# Patient Record
Sex: Female | Born: 1940
Health system: Southern US, Community
[De-identification: ages and names within clinical notes are randomized; demographics above are authoritative.]

## PROBLEM LIST (undated history)

## (undated) DIAGNOSIS — I872 Venous insufficiency (chronic) (peripheral): Secondary | ICD-10-CM

## (undated) DIAGNOSIS — H903 Sensorineural hearing loss, bilateral: Secondary | ICD-10-CM

## (undated) DIAGNOSIS — Z8541 Personal history of malignant neoplasm of cervix uteri: Secondary | ICD-10-CM

## (undated) DIAGNOSIS — I509 Heart failure, unspecified: Secondary | ICD-10-CM

## (undated) DIAGNOSIS — C569 Malignant neoplasm of unspecified ovary: Secondary | ICD-10-CM

## (undated) DIAGNOSIS — K219 Gastro-esophageal reflux disease without esophagitis: Secondary | ICD-10-CM

## (undated) DIAGNOSIS — E785 Hyperlipidemia, unspecified: Secondary | ICD-10-CM

## (undated) DIAGNOSIS — F29 Unspecified psychosis not due to a substance or known physiological condition: Secondary | ICD-10-CM

## (undated) DIAGNOSIS — F1120 Opioid dependence, uncomplicated: Secondary | ICD-10-CM

## (undated) DIAGNOSIS — M51379 Other intervertebral disc degeneration, lumbosacral region without mention of lumbar back pain or lower extremity pain: Secondary | ICD-10-CM

## (undated) DIAGNOSIS — I10 Essential (primary) hypertension: Secondary | ICD-10-CM

## (undated) DIAGNOSIS — M169 Osteoarthritis of hip, unspecified: Secondary | ICD-10-CM

## (undated) DIAGNOSIS — L0293 Carbuncle, unspecified: Secondary | ICD-10-CM

## (undated) DIAGNOSIS — I341 Nonrheumatic mitral (valve) prolapse: Secondary | ICD-10-CM

## (undated) DIAGNOSIS — S065X9A Traumatic subdural hemorrhage with loss of consciousness of unspecified duration, initial encounter: Secondary | ICD-10-CM

## (undated) DIAGNOSIS — L910 Hypertrophic scar: Secondary | ICD-10-CM

## (undated) DIAGNOSIS — L0292 Furuncle, unspecified: Secondary | ICD-10-CM

## (undated) DIAGNOSIS — F32A Depression, unspecified: Secondary | ICD-10-CM

## (undated) DIAGNOSIS — M159 Polyosteoarthritis, unspecified: Secondary | ICD-10-CM

## (undated) DIAGNOSIS — R569 Unspecified convulsions: Secondary | ICD-10-CM

## (undated) DIAGNOSIS — Z8673 Personal history of transient ischemic attack (TIA), and cerebral infarction without residual deficits: Secondary | ICD-10-CM

## (undated) DIAGNOSIS — M5137 Other intervertebral disc degeneration, lumbosacral region: Secondary | ICD-10-CM

## (undated) DIAGNOSIS — K5730 Diverticulosis of large intestine without perforation or abscess without bleeding: Secondary | ICD-10-CM

## (undated) DIAGNOSIS — F329 Major depressive disorder, single episode, unspecified: Secondary | ICD-10-CM

## (undated) DIAGNOSIS — R413 Other amnesia: Secondary | ICD-10-CM

## (undated) HISTORY — DX: Other intervertebral disc degeneration, lumbosacral region: M51.37

## (undated) HISTORY — DX: Other intervertebral disc degeneration, lumbosacral region without mention of lumbar back pain or lower extremity pain: M51.379

## (undated) HISTORY — DX: Personal history of transient ischemic attack (TIA), and cerebral infarction without residual deficits: Z86.73

## (undated) HISTORY — DX: Sensorineural hearing loss, bilateral: H90.3

## (undated) HISTORY — DX: Venous insufficiency (chronic) (peripheral): I87.2

## (undated) HISTORY — DX: Hypertrophic scar: L91.0

## (undated) HISTORY — DX: Essential (primary) hypertension: I10

## (undated) HISTORY — DX: Other amnesia: R41.3

## (undated) HISTORY — DX: Personal history of malignant neoplasm of cervix uteri: Z85.41

## (undated) HISTORY — DX: Osteoarthritis of hip, unspecified: M16.9

## (undated) HISTORY — DX: Nonrheumatic mitral (valve) prolapse: I34.1

## (undated) HISTORY — DX: Furuncle, unspecified: L02.92

## (undated) HISTORY — DX: Unspecified convulsions: R56.9

## (undated) HISTORY — PX: OOPHORECTOMY: SHX86

## (undated) HISTORY — PX: LUMBAR DISC SURGERY: SHX700

## (undated) HISTORY — DX: Diverticulosis of large intestine without perforation or abscess without bleeding: K57.30

## (undated) HISTORY — DX: Malignant neoplasm of unspecified ovary: C56.9

## (undated) HISTORY — DX: Gastro-esophageal reflux disease without esophagitis: K21.9

## (undated) HISTORY — DX: Hyperlipidemia, unspecified: E78.5

## (undated) HISTORY — DX: Opioid dependence, uncomplicated: F11.20

## (undated) HISTORY — DX: Carbuncle, unspecified: L02.93

## (undated) HISTORY — DX: Major depressive disorder, single episode, unspecified: F32.9

## (undated) HISTORY — DX: Heart failure, unspecified: I50.9

## (undated) HISTORY — DX: Depression, unspecified: F32.A

## (undated) HISTORY — DX: Polyosteoarthritis, unspecified: M15.9

## (undated) HISTORY — PX: ABDOMINAL HYSTERECTOMY: SHX81

## (undated) HISTORY — DX: Unspecified psychosis not due to a substance or known physiological condition: F29

## (undated) HISTORY — PX: JOINT REPLACEMENT: SHX530

## (undated) HISTORY — DX: Traumatic subdural hemorrhage with loss of consciousness of unspecified duration, initial encounter: S06.5X9A

---

## 1970-10-30 DIAGNOSIS — Z8541 Personal history of malignant neoplasm of cervix uteri: Secondary | ICD-10-CM

## 1970-10-30 DIAGNOSIS — C569 Malignant neoplasm of unspecified ovary: Secondary | ICD-10-CM

## 1970-10-30 HISTORY — DX: Personal history of malignant neoplasm of cervix uteri: Z85.41

## 1970-10-30 HISTORY — DX: Malignant neoplasm of unspecified ovary: C56.9

## 1998-02-01 ENCOUNTER — Encounter: Admission: RE | Admit: 1998-02-01 | Discharge: 1998-02-01 | Payer: Self-pay | Admitting: Hematology and Oncology

## 1998-02-22 ENCOUNTER — Encounter: Admission: RE | Admit: 1998-02-22 | Discharge: 1998-02-22 | Payer: Self-pay | Admitting: Internal Medicine

## 1998-03-08 ENCOUNTER — Encounter: Admission: RE | Admit: 1998-03-08 | Discharge: 1998-03-08 | Payer: Self-pay | Admitting: Internal Medicine

## 1998-04-15 ENCOUNTER — Encounter: Admission: RE | Admit: 1998-04-15 | Discharge: 1998-04-15 | Payer: Self-pay | Admitting: Hematology and Oncology

## 1998-05-07 ENCOUNTER — Ambulatory Visit (HOSPITAL_BASED_OUTPATIENT_CLINIC_OR_DEPARTMENT_OTHER): Admission: RE | Admit: 1998-05-07 | Discharge: 1998-05-07 | Payer: Self-pay | Admitting: Orthopaedic Surgery

## 1998-06-09 ENCOUNTER — Encounter: Admission: RE | Admit: 1998-06-09 | Discharge: 1998-06-09 | Payer: Self-pay | Admitting: Internal Medicine

## 1998-07-16 ENCOUNTER — Encounter: Admission: RE | Admit: 1998-07-16 | Discharge: 1998-07-16 | Payer: Self-pay | Admitting: Internal Medicine

## 1998-07-28 ENCOUNTER — Encounter: Admission: RE | Admit: 1998-07-28 | Discharge: 1998-07-28 | Payer: Self-pay | Admitting: Internal Medicine

## 1998-08-09 ENCOUNTER — Encounter: Admission: RE | Admit: 1998-08-09 | Discharge: 1998-08-09 | Payer: Self-pay | Admitting: Internal Medicine

## 1998-09-30 ENCOUNTER — Encounter: Admission: RE | Admit: 1998-09-30 | Discharge: 1998-09-30 | Payer: Self-pay | Admitting: Internal Medicine

## 1998-11-10 ENCOUNTER — Encounter: Admission: RE | Admit: 1998-11-10 | Discharge: 1998-11-10 | Payer: Self-pay | Admitting: Hematology and Oncology

## 1998-11-10 ENCOUNTER — Encounter: Payer: Self-pay | Admitting: Hematology and Oncology

## 1998-11-10 ENCOUNTER — Ambulatory Visit (HOSPITAL_COMMUNITY): Admission: RE | Admit: 1998-11-10 | Discharge: 1998-11-10 | Payer: Self-pay | Admitting: Hematology and Oncology

## 1998-11-17 ENCOUNTER — Encounter: Admission: RE | Admit: 1998-11-17 | Discharge: 1998-11-17 | Payer: Self-pay | Admitting: Internal Medicine

## 1999-01-19 ENCOUNTER — Encounter: Admission: RE | Admit: 1999-01-19 | Discharge: 1999-01-19 | Payer: Self-pay | Admitting: Hematology and Oncology

## 1999-04-20 ENCOUNTER — Encounter: Admission: RE | Admit: 1999-04-20 | Discharge: 1999-04-20 | Payer: Self-pay | Admitting: Hematology and Oncology

## 1999-05-06 ENCOUNTER — Ambulatory Visit (HOSPITAL_COMMUNITY): Admission: RE | Admit: 1999-05-06 | Discharge: 1999-05-06 | Payer: Self-pay | Admitting: *Deleted

## 1999-05-13 ENCOUNTER — Ambulatory Visit (HOSPITAL_COMMUNITY): Admission: RE | Admit: 1999-05-13 | Discharge: 1999-05-13 | Payer: Self-pay | Admitting: *Deleted

## 1999-07-08 ENCOUNTER — Encounter: Admission: RE | Admit: 1999-07-08 | Discharge: 1999-07-08 | Payer: Self-pay | Admitting: Internal Medicine

## 1999-08-10 ENCOUNTER — Encounter: Admission: RE | Admit: 1999-08-10 | Discharge: 1999-08-10 | Payer: Self-pay | Admitting: Hematology and Oncology

## 1999-08-26 ENCOUNTER — Encounter: Admission: RE | Admit: 1999-08-26 | Discharge: 1999-08-26 | Payer: Self-pay | Admitting: Internal Medicine

## 1999-08-26 ENCOUNTER — Encounter: Payer: Self-pay | Admitting: Internal Medicine

## 1999-08-26 ENCOUNTER — Ambulatory Visit (HOSPITAL_COMMUNITY): Admission: RE | Admit: 1999-08-26 | Discharge: 1999-08-26 | Payer: Self-pay | Admitting: Internal Medicine

## 1999-10-07 ENCOUNTER — Encounter: Admission: RE | Admit: 1999-10-07 | Discharge: 1999-10-07 | Payer: Self-pay | Admitting: Internal Medicine

## 1999-10-27 ENCOUNTER — Ambulatory Visit (HOSPITAL_COMMUNITY): Admission: RE | Admit: 1999-10-27 | Discharge: 1999-10-27 | Payer: Self-pay | Admitting: Internal Medicine

## 1999-10-27 DIAGNOSIS — I341 Nonrheumatic mitral (valve) prolapse: Secondary | ICD-10-CM

## 1999-10-27 HISTORY — DX: Nonrheumatic mitral (valve) prolapse: I34.1

## 1999-11-11 ENCOUNTER — Encounter: Admission: RE | Admit: 1999-11-11 | Discharge: 1999-11-11 | Payer: Self-pay | Admitting: Internal Medicine

## 1999-11-25 ENCOUNTER — Ambulatory Visit (HOSPITAL_BASED_OUTPATIENT_CLINIC_OR_DEPARTMENT_OTHER): Admission: RE | Admit: 1999-11-25 | Discharge: 1999-11-25 | Payer: Self-pay | Admitting: Podiatry

## 2000-01-20 ENCOUNTER — Encounter: Admission: RE | Admit: 2000-01-20 | Discharge: 2000-01-20 | Payer: Self-pay | Admitting: Hematology and Oncology

## 2000-05-10 ENCOUNTER — Encounter: Admission: RE | Admit: 2000-05-10 | Discharge: 2000-05-10 | Payer: Self-pay | Admitting: Hematology and Oncology

## 2000-06-06 ENCOUNTER — Encounter: Admission: RE | Admit: 2000-06-06 | Discharge: 2000-06-06 | Payer: Self-pay | Admitting: Hematology and Oncology

## 2000-06-20 ENCOUNTER — Encounter: Admission: RE | Admit: 2000-06-20 | Discharge: 2000-06-20 | Payer: Self-pay | Admitting: Internal Medicine

## 2000-06-21 ENCOUNTER — Encounter: Admission: RE | Admit: 2000-06-21 | Discharge: 2000-06-21 | Payer: Self-pay | Admitting: Internal Medicine

## 2000-07-24 ENCOUNTER — Encounter: Admission: RE | Admit: 2000-07-24 | Discharge: 2000-07-24 | Payer: Self-pay | Admitting: Hematology and Oncology

## 2000-09-05 ENCOUNTER — Encounter: Admission: RE | Admit: 2000-09-05 | Discharge: 2000-09-05 | Payer: Self-pay | Admitting: Internal Medicine

## 2000-10-02 ENCOUNTER — Encounter: Admission: RE | Admit: 2000-10-02 | Discharge: 2000-10-02 | Payer: Self-pay | Admitting: Hematology and Oncology

## 2000-11-07 ENCOUNTER — Encounter: Admission: RE | Admit: 2000-11-07 | Discharge: 2000-11-07 | Payer: Self-pay

## 2001-01-03 ENCOUNTER — Encounter: Admission: RE | Admit: 2001-01-03 | Discharge: 2001-01-03 | Payer: Self-pay | Admitting: Internal Medicine

## 2001-01-30 ENCOUNTER — Encounter: Admission: RE | Admit: 2001-01-30 | Discharge: 2001-01-30 | Payer: Self-pay | Admitting: Internal Medicine

## 2001-02-01 ENCOUNTER — Emergency Department (HOSPITAL_COMMUNITY): Admission: EM | Admit: 2001-02-01 | Discharge: 2001-02-01 | Payer: Self-pay | Admitting: Emergency Medicine

## 2001-02-02 ENCOUNTER — Encounter: Payer: Self-pay | Admitting: Emergency Medicine

## 2001-03-28 ENCOUNTER — Ambulatory Visit (HOSPITAL_COMMUNITY): Admission: RE | Admit: 2001-03-28 | Discharge: 2001-03-28 | Payer: Self-pay | Admitting: Internal Medicine

## 2001-03-28 ENCOUNTER — Encounter: Payer: Self-pay | Admitting: *Deleted

## 2001-03-28 ENCOUNTER — Encounter: Admission: RE | Admit: 2001-03-28 | Discharge: 2001-03-28 | Payer: Self-pay | Admitting: Internal Medicine

## 2001-05-13 ENCOUNTER — Inpatient Hospital Stay (HOSPITAL_COMMUNITY): Admission: AD | Admit: 2001-05-13 | Discharge: 2001-05-16 | Payer: Self-pay | Admitting: Internal Medicine

## 2001-05-13 ENCOUNTER — Encounter: Payer: Self-pay | Admitting: Internal Medicine

## 2001-05-13 ENCOUNTER — Encounter: Admission: RE | Admit: 2001-05-13 | Discharge: 2001-05-13 | Payer: Self-pay | Admitting: Internal Medicine

## 2001-05-13 DIAGNOSIS — Z8673 Personal history of transient ischemic attack (TIA), and cerebral infarction without residual deficits: Secondary | ICD-10-CM | POA: Insufficient documentation

## 2001-05-13 HISTORY — DX: Personal history of transient ischemic attack (TIA), and cerebral infarction without residual deficits: Z86.73

## 2001-05-15 ENCOUNTER — Encounter: Payer: Self-pay | Admitting: Internal Medicine

## 2001-06-06 ENCOUNTER — Encounter: Admission: RE | Admit: 2001-06-06 | Discharge: 2001-06-06 | Payer: Self-pay | Admitting: Internal Medicine

## 2001-07-11 ENCOUNTER — Encounter: Admission: RE | Admit: 2001-07-11 | Discharge: 2001-07-11 | Payer: Self-pay | Admitting: Internal Medicine

## 2001-08-08 ENCOUNTER — Encounter: Admission: RE | Admit: 2001-08-08 | Discharge: 2001-08-08 | Payer: Self-pay

## 2001-08-28 ENCOUNTER — Encounter: Admission: RE | Admit: 2001-08-28 | Discharge: 2001-08-28 | Payer: Self-pay | Admitting: Internal Medicine

## 2001-11-08 ENCOUNTER — Encounter: Admission: RE | Admit: 2001-11-08 | Discharge: 2001-11-08 | Payer: Self-pay | Admitting: *Deleted

## 2002-01-09 ENCOUNTER — Encounter: Admission: RE | Admit: 2002-01-09 | Discharge: 2002-01-09 | Payer: Self-pay

## 2002-04-10 ENCOUNTER — Encounter: Admission: RE | Admit: 2002-04-10 | Discharge: 2002-04-10 | Payer: Self-pay | Admitting: Internal Medicine

## 2002-04-29 ENCOUNTER — Ambulatory Visit (HOSPITAL_COMMUNITY): Admission: RE | Admit: 2002-04-29 | Discharge: 2002-04-29 | Payer: Self-pay | Admitting: Internal Medicine

## 2002-05-27 ENCOUNTER — Encounter: Admission: RE | Admit: 2002-05-27 | Discharge: 2002-05-27 | Payer: Self-pay | Admitting: Internal Medicine

## 2002-06-24 ENCOUNTER — Encounter: Admission: RE | Admit: 2002-06-24 | Discharge: 2002-06-24 | Payer: Self-pay | Admitting: Internal Medicine

## 2002-07-09 ENCOUNTER — Encounter: Admission: RE | Admit: 2002-07-09 | Discharge: 2002-07-09 | Payer: Self-pay | Admitting: Internal Medicine

## 2002-07-09 ENCOUNTER — Encounter: Payer: Self-pay | Admitting: Internal Medicine

## 2002-07-09 ENCOUNTER — Ambulatory Visit (HOSPITAL_COMMUNITY): Admission: RE | Admit: 2002-07-09 | Discharge: 2002-07-09 | Payer: Self-pay | Admitting: Internal Medicine

## 2002-09-02 ENCOUNTER — Ambulatory Visit (HOSPITAL_COMMUNITY): Admission: RE | Admit: 2002-09-02 | Discharge: 2002-09-02 | Payer: Self-pay | Admitting: Orthopaedic Surgery

## 2002-09-15 ENCOUNTER — Encounter: Admission: RE | Admit: 2002-09-15 | Discharge: 2002-09-15 | Payer: Self-pay | Admitting: Internal Medicine

## 2002-09-29 ENCOUNTER — Encounter: Admission: RE | Admit: 2002-09-29 | Discharge: 2002-09-29 | Payer: Self-pay | Admitting: Internal Medicine

## 2002-11-12 ENCOUNTER — Ambulatory Visit (HOSPITAL_COMMUNITY): Admission: RE | Admit: 2002-11-12 | Discharge: 2002-11-12 | Payer: Self-pay | Admitting: Orthopaedic Surgery

## 2002-12-04 ENCOUNTER — Encounter: Admission: RE | Admit: 2002-12-04 | Discharge: 2002-12-04 | Payer: Self-pay | Admitting: Internal Medicine

## 2002-12-17 ENCOUNTER — Encounter: Admission: RE | Admit: 2002-12-17 | Discharge: 2002-12-17 | Payer: Self-pay | Admitting: Internal Medicine

## 2003-02-13 ENCOUNTER — Encounter: Admission: RE | Admit: 2003-02-13 | Discharge: 2003-02-13 | Payer: Self-pay | Admitting: Internal Medicine

## 2003-02-16 ENCOUNTER — Encounter: Admission: RE | Admit: 2003-02-16 | Discharge: 2003-02-16 | Payer: Self-pay | Admitting: Internal Medicine

## 2003-03-04 ENCOUNTER — Ambulatory Visit (HOSPITAL_COMMUNITY): Admission: RE | Admit: 2003-03-04 | Discharge: 2003-03-04 | Payer: Self-pay | Admitting: Internal Medicine

## 2003-03-04 ENCOUNTER — Encounter: Payer: Self-pay | Admitting: Internal Medicine

## 2003-06-17 ENCOUNTER — Encounter: Admission: RE | Admit: 2003-06-17 | Discharge: 2003-06-17 | Payer: Self-pay | Admitting: Internal Medicine

## 2003-08-06 ENCOUNTER — Inpatient Hospital Stay (HOSPITAL_COMMUNITY): Admission: RE | Admit: 2003-08-06 | Discharge: 2003-08-13 | Payer: Self-pay | Admitting: Orthopaedic Surgery

## 2003-08-13 ENCOUNTER — Inpatient Hospital Stay (HOSPITAL_COMMUNITY)
Admission: RE | Admit: 2003-08-13 | Discharge: 2003-08-21 | Payer: Self-pay | Admitting: Physical Medicine & Rehabilitation

## 2003-11-17 ENCOUNTER — Encounter: Admission: RE | Admit: 2003-11-17 | Discharge: 2003-11-17 | Payer: Self-pay | Admitting: Internal Medicine

## 2003-11-19 ENCOUNTER — Ambulatory Visit (HOSPITAL_COMMUNITY): Admission: RE | Admit: 2003-11-19 | Discharge: 2003-11-19 | Payer: Self-pay | Admitting: Internal Medicine

## 2004-02-15 ENCOUNTER — Encounter: Admission: RE | Admit: 2004-02-15 | Discharge: 2004-02-15 | Payer: Self-pay | Admitting: Internal Medicine

## 2004-03-07 ENCOUNTER — Encounter: Admission: RE | Admit: 2004-03-07 | Discharge: 2004-03-07 | Payer: Self-pay | Admitting: Internal Medicine

## 2004-03-30 ENCOUNTER — Encounter: Admission: RE | Admit: 2004-03-30 | Discharge: 2004-03-30 | Payer: Self-pay | Admitting: Internal Medicine

## 2004-03-31 ENCOUNTER — Encounter: Admission: RE | Admit: 2004-03-31 | Discharge: 2004-03-31 | Payer: Self-pay | Admitting: Internal Medicine

## 2004-05-28 ENCOUNTER — Emergency Department (HOSPITAL_COMMUNITY): Admission: EM | Admit: 2004-05-28 | Discharge: 2004-05-28 | Payer: Self-pay | Admitting: Emergency Medicine

## 2004-08-02 ENCOUNTER — Ambulatory Visit: Payer: Self-pay | Admitting: Internal Medicine

## 2004-09-01 ENCOUNTER — Ambulatory Visit (HOSPITAL_COMMUNITY): Admission: RE | Admit: 2004-09-01 | Discharge: 2004-09-01 | Payer: Self-pay | Admitting: Gastroenterology

## 2004-09-01 ENCOUNTER — Encounter (INDEPENDENT_AMBULATORY_CARE_PROVIDER_SITE_OTHER): Payer: Self-pay | Admitting: *Deleted

## 2004-10-04 ENCOUNTER — Emergency Department (HOSPITAL_COMMUNITY): Admission: EM | Admit: 2004-10-04 | Discharge: 2004-10-04 | Payer: Self-pay | Admitting: Family Medicine

## 2004-11-01 ENCOUNTER — Ambulatory Visit: Payer: Self-pay | Admitting: Internal Medicine

## 2004-11-15 ENCOUNTER — Ambulatory Visit: Payer: Self-pay | Admitting: Internal Medicine

## 2004-11-23 ENCOUNTER — Ambulatory Visit (HOSPITAL_COMMUNITY): Admission: RE | Admit: 2004-11-23 | Discharge: 2004-11-23 | Payer: Self-pay | Admitting: Obstetrics & Gynecology

## 2004-11-25 ENCOUNTER — Inpatient Hospital Stay (HOSPITAL_COMMUNITY): Admission: RE | Admit: 2004-11-25 | Discharge: 2004-11-28 | Payer: Self-pay | Admitting: Orthopaedic Surgery

## 2004-11-28 ENCOUNTER — Ambulatory Visit: Payer: Self-pay | Admitting: Physical Medicine & Rehabilitation

## 2004-11-28 ENCOUNTER — Inpatient Hospital Stay
Admission: RE | Admit: 2004-11-28 | Discharge: 2004-12-06 | Payer: Self-pay | Admitting: Physical Medicine & Rehabilitation

## 2004-11-29 ENCOUNTER — Ambulatory Visit (HOSPITAL_COMMUNITY)
Admission: RE | Admit: 2004-11-29 | Discharge: 2004-11-29 | Payer: Self-pay | Admitting: Physical Medicine & Rehabilitation

## 2005-01-17 ENCOUNTER — Ambulatory Visit: Payer: Self-pay | Admitting: Internal Medicine

## 2005-01-24 ENCOUNTER — Ambulatory Visit: Payer: Self-pay | Admitting: Internal Medicine

## 2005-04-13 ENCOUNTER — Ambulatory Visit: Payer: Self-pay | Admitting: Internal Medicine

## 2005-04-27 ENCOUNTER — Encounter: Admission: RE | Admit: 2005-04-27 | Discharge: 2005-04-27 | Payer: Self-pay | Admitting: Orthopaedic Surgery

## 2005-05-18 ENCOUNTER — Encounter: Admission: RE | Admit: 2005-05-18 | Discharge: 2005-05-18 | Payer: Self-pay | Admitting: Orthopaedic Surgery

## 2005-07-04 ENCOUNTER — Ambulatory Visit: Payer: Self-pay | Admitting: Internal Medicine

## 2005-07-27 ENCOUNTER — Ambulatory Visit (HOSPITAL_BASED_OUTPATIENT_CLINIC_OR_DEPARTMENT_OTHER): Admission: RE | Admit: 2005-07-27 | Discharge: 2005-07-27 | Payer: Self-pay | Admitting: Unknown Physician Specialty

## 2005-07-30 ENCOUNTER — Ambulatory Visit: Payer: Self-pay | Admitting: Internal Medicine

## 2005-08-04 ENCOUNTER — Ambulatory Visit (HOSPITAL_BASED_OUTPATIENT_CLINIC_OR_DEPARTMENT_OTHER): Admission: RE | Admit: 2005-08-04 | Discharge: 2005-08-04 | Payer: Self-pay | Admitting: Internal Medicine

## 2005-08-07 ENCOUNTER — Ambulatory Visit: Payer: Self-pay | Admitting: Hospitalist

## 2005-09-21 ENCOUNTER — Emergency Department (HOSPITAL_COMMUNITY): Admission: EM | Admit: 2005-09-21 | Discharge: 2005-09-22 | Payer: Self-pay | Admitting: Emergency Medicine

## 2005-10-04 ENCOUNTER — Ambulatory Visit: Payer: Self-pay | Admitting: Internal Medicine

## 2005-10-05 ENCOUNTER — Ambulatory Visit (HOSPITAL_COMMUNITY): Admission: RE | Admit: 2005-10-05 | Discharge: 2005-10-05 | Payer: Self-pay | Admitting: Unknown Physician Specialty

## 2005-10-18 ENCOUNTER — Ambulatory Visit: Payer: Self-pay | Admitting: Internal Medicine

## 2005-11-08 ENCOUNTER — Ambulatory Visit: Payer: Self-pay | Admitting: Internal Medicine

## 2005-11-14 ENCOUNTER — Ambulatory Visit (HOSPITAL_COMMUNITY): Admission: RE | Admit: 2005-11-14 | Discharge: 2005-11-14 | Payer: Self-pay | Admitting: Internal Medicine

## 2005-12-07 ENCOUNTER — Ambulatory Visit (HOSPITAL_COMMUNITY): Admission: RE | Admit: 2005-12-07 | Discharge: 2005-12-07 | Payer: Self-pay | Admitting: Family Medicine

## 2005-12-07 ENCOUNTER — Encounter (INDEPENDENT_AMBULATORY_CARE_PROVIDER_SITE_OTHER): Payer: Self-pay | Admitting: Unknown Physician Specialty

## 2006-01-30 ENCOUNTER — Ambulatory Visit (HOSPITAL_COMMUNITY): Admission: RE | Admit: 2006-01-30 | Discharge: 2006-01-30 | Payer: Self-pay | Admitting: Thoracic Surgery

## 2006-03-23 ENCOUNTER — Ambulatory Visit (HOSPITAL_COMMUNITY): Admission: RE | Admit: 2006-03-23 | Discharge: 2006-03-23 | Payer: Self-pay | Admitting: Orthopaedic Surgery

## 2006-04-04 ENCOUNTER — Ambulatory Visit: Payer: Self-pay | Admitting: Internal Medicine

## 2006-04-23 ENCOUNTER — Inpatient Hospital Stay (HOSPITAL_COMMUNITY): Admission: RE | Admit: 2006-04-23 | Discharge: 2006-04-27 | Payer: Self-pay | Admitting: Orthopaedic Surgery

## 2006-04-29 HISTORY — PX: CERVICAL DISCECTOMY: SHX98

## 2006-07-17 ENCOUNTER — Ambulatory Visit (HOSPITAL_COMMUNITY): Admission: RE | Admit: 2006-07-17 | Discharge: 2006-07-17 | Payer: Self-pay | Admitting: Thoracic Surgery

## 2006-07-24 ENCOUNTER — Ambulatory Visit: Payer: Self-pay | Admitting: Internal Medicine

## 2006-08-01 ENCOUNTER — Encounter (INDEPENDENT_AMBULATORY_CARE_PROVIDER_SITE_OTHER): Payer: Self-pay | Admitting: Unknown Physician Specialty

## 2006-08-01 ENCOUNTER — Ambulatory Visit: Payer: Self-pay | Admitting: Internal Medicine

## 2006-09-03 DIAGNOSIS — IMO0002 Reserved for concepts with insufficient information to code with codable children: Secondary | ICD-10-CM | POA: Insufficient documentation

## 2006-09-03 DIAGNOSIS — M109 Gout, unspecified: Secondary | ICD-10-CM | POA: Insufficient documentation

## 2006-09-03 DIAGNOSIS — I872 Venous insufficiency (chronic) (peripheral): Secondary | ICD-10-CM | POA: Insufficient documentation

## 2006-09-03 DIAGNOSIS — I10 Essential (primary) hypertension: Secondary | ICD-10-CM | POA: Insufficient documentation

## 2006-09-03 DIAGNOSIS — Z8679 Personal history of other diseases of the circulatory system: Secondary | ICD-10-CM | POA: Insufficient documentation

## 2006-09-03 DIAGNOSIS — E785 Hyperlipidemia, unspecified: Secondary | ICD-10-CM | POA: Insufficient documentation

## 2006-09-03 DIAGNOSIS — K219 Gastro-esophageal reflux disease without esophagitis: Secondary | ICD-10-CM | POA: Insufficient documentation

## 2006-11-29 ENCOUNTER — Telehealth: Payer: Self-pay | Admitting: *Deleted

## 2006-12-11 ENCOUNTER — Ambulatory Visit (HOSPITAL_COMMUNITY): Admission: RE | Admit: 2006-12-11 | Discharge: 2006-12-11 | Payer: Self-pay | Admitting: Thoracic Surgery

## 2006-12-12 ENCOUNTER — Ambulatory Visit: Payer: Self-pay | Admitting: Thoracic Surgery

## 2006-12-18 ENCOUNTER — Ambulatory Visit (HOSPITAL_COMMUNITY): Admission: RE | Admit: 2006-12-18 | Discharge: 2006-12-18 | Payer: Self-pay | Admitting: Obstetrics and Gynecology

## 2006-12-18 ENCOUNTER — Encounter (INDEPENDENT_AMBULATORY_CARE_PROVIDER_SITE_OTHER): Payer: Self-pay | Admitting: Unknown Physician Specialty

## 2006-12-20 ENCOUNTER — Ambulatory Visit: Payer: Self-pay | Admitting: Internal Medicine

## 2006-12-20 ENCOUNTER — Encounter (INDEPENDENT_AMBULATORY_CARE_PROVIDER_SITE_OTHER): Payer: Self-pay | Admitting: Unknown Physician Specialty

## 2006-12-20 LAB — CONVERTED CEMR LAB
BUN: 15 mg/dL (ref 6–23)
Bilirubin Urine: NEGATIVE
Blood in Urine, dipstick: NEGATIVE
CO2: 21 meq/L (ref 19–32)
Calcium: 9.7 mg/dL (ref 8.4–10.5)
Chloride: 104 meq/L (ref 96–112)
Cholesterol: 190 mg/dL (ref 0–200)
Creatinine, Ser: 1.02 mg/dL (ref 0.40–1.20)
Glucose, Bld: 92 mg/dL (ref 70–99)
Glucose, Urine, Semiquant: NEGATIVE
HDL: 57 mg/dL (ref >39)
Ketones, urine, test strip: NEGATIVE
LDL Cholesterol: 105 mg/dL — ABNORMAL HIGH (ref 0–99)
Nitrite: NEGATIVE
Potassium: 4.3 meq/L (ref 3.5–5.3)
Protein, U semiquant: NEGATIVE
Sodium: 139 meq/L (ref 135–145)
Specific Gravity, Urine: 1.015
TSH: 6.049 microintl units/mL — ABNORMAL HIGH (ref 0.350–5.50)
Total CHOL/HDL Ratio: 3.3
Triglycerides: 139 mg/dL (ref <150)
Urobilinogen, UA: 0.2
VLDL: 28 mg/dL (ref 0–40)
WBC Urine, dipstick: NEGATIVE
pH: 5

## 2006-12-21 ENCOUNTER — Encounter (INDEPENDENT_AMBULATORY_CARE_PROVIDER_SITE_OTHER): Payer: Self-pay | Admitting: Unknown Physician Specialty

## 2006-12-24 ENCOUNTER — Ambulatory Visit (HOSPITAL_COMMUNITY): Admission: RE | Admit: 2006-12-24 | Discharge: 2006-12-24 | Payer: Self-pay | Admitting: Internal Medicine

## 2006-12-24 ENCOUNTER — Encounter (INDEPENDENT_AMBULATORY_CARE_PROVIDER_SITE_OTHER): Payer: Self-pay | Admitting: Unknown Physician Specialty

## 2006-12-25 ENCOUNTER — Encounter (INDEPENDENT_AMBULATORY_CARE_PROVIDER_SITE_OTHER): Payer: Self-pay | Admitting: Unknown Physician Specialty

## 2006-12-25 ENCOUNTER — Ambulatory Visit: Payer: Self-pay | Admitting: Hospitalist

## 2006-12-25 LAB — CONVERTED CEMR LAB: Free T4: 0.94 ng/dL (ref 0.89–1.80)

## 2006-12-30 ENCOUNTER — Ambulatory Visit (HOSPITAL_COMMUNITY): Admission: RE | Admit: 2006-12-30 | Discharge: 2006-12-30 | Payer: Self-pay | Admitting: Orthopaedic Surgery

## 2007-01-14 ENCOUNTER — Ambulatory Visit (HOSPITAL_COMMUNITY): Admission: RE | Admit: 2007-01-14 | Discharge: 2007-01-14 | Payer: Self-pay | Admitting: *Deleted

## 2007-01-14 ENCOUNTER — Encounter (INDEPENDENT_AMBULATORY_CARE_PROVIDER_SITE_OTHER): Payer: Self-pay | Admitting: Unknown Physician Specialty

## 2007-01-14 ENCOUNTER — Ambulatory Visit: Payer: Self-pay | Admitting: *Deleted

## 2007-01-14 LAB — CONVERTED CEMR LAB
BUN: 21 mg/dL (ref 6–23)
CO2: 22 meq/L (ref 19–32)
Calcium: 9.8 mg/dL (ref 8.4–10.5)
Chloride: 100 meq/L (ref 96–112)
Creatinine, Ser: 1.31 mg/dL — ABNORMAL HIGH (ref 0.40–1.20)
Glucose, Bld: 99 mg/dL (ref 70–99)
Potassium: 4.8 meq/L (ref 3.5–5.3)
Sodium: 135 meq/L (ref 135–145)

## 2007-01-23 ENCOUNTER — Encounter: Admission: RE | Admit: 2007-01-23 | Discharge: 2007-01-23 | Payer: Self-pay | Admitting: Orthopaedic Surgery

## 2007-02-01 ENCOUNTER — Telehealth: Payer: Self-pay | Admitting: *Deleted

## 2007-02-06 ENCOUNTER — Encounter: Admission: RE | Admit: 2007-02-06 | Discharge: 2007-02-06 | Payer: Self-pay | Admitting: Orthopaedic Surgery

## 2007-02-07 ENCOUNTER — Encounter: Payer: Self-pay | Admitting: Internal Medicine

## 2007-02-07 ENCOUNTER — Ambulatory Visit: Payer: Self-pay | Admitting: Internal Medicine

## 2007-02-25 ENCOUNTER — Telehealth (INDEPENDENT_AMBULATORY_CARE_PROVIDER_SITE_OTHER): Payer: Self-pay | Admitting: *Deleted

## 2007-03-19 ENCOUNTER — Ambulatory Visit: Payer: Self-pay | Admitting: Hospitalist

## 2007-03-20 ENCOUNTER — Encounter (INDEPENDENT_AMBULATORY_CARE_PROVIDER_SITE_OTHER): Payer: Self-pay | Admitting: Internal Medicine

## 2007-03-20 ENCOUNTER — Encounter (INDEPENDENT_AMBULATORY_CARE_PROVIDER_SITE_OTHER): Payer: Self-pay | Admitting: *Deleted

## 2007-03-29 ENCOUNTER — Telehealth: Payer: Self-pay | Admitting: *Deleted

## 2007-04-02 ENCOUNTER — Ambulatory Visit: Payer: Self-pay | Admitting: Internal Medicine

## 2007-04-18 ENCOUNTER — Telehealth: Payer: Self-pay | Admitting: *Deleted

## 2007-05-02 ENCOUNTER — Ambulatory Visit: Payer: Self-pay | Admitting: Internal Medicine

## 2007-05-02 ENCOUNTER — Encounter (INDEPENDENT_AMBULATORY_CARE_PROVIDER_SITE_OTHER): Payer: Self-pay | Admitting: *Deleted

## 2007-05-08 ENCOUNTER — Encounter: Payer: Self-pay | Admitting: Internal Medicine

## 2007-05-08 ENCOUNTER — Ambulatory Visit: Payer: Self-pay | Admitting: Internal Medicine

## 2007-05-08 LAB — CONVERTED CEMR LAB
Free T4: 1.05 ng/dL (ref 0.89–1.80)
TSH: 5.081 microintl units/mL (ref 0.350–5.50)

## 2007-05-10 LAB — CONVERTED CEMR LAB
ALT: 19 units/L (ref 0–35)
AST: 18 units/L (ref 0–37)
Albumin: 4.2 g/dL (ref 3.5–5.2)
Alkaline Phosphatase: 94 units/L (ref 39–117)
BUN: 30 mg/dL — ABNORMAL HIGH (ref 6–23)
Basophils Absolute: 0 10*3/uL (ref 0.0–0.1)
Basophils Relative: 0 % (ref 0–1)
CO2: 23 meq/L (ref 19–32)
Calcium: 9.6 mg/dL (ref 8.4–10.5)
Chloride: 107 meq/L (ref 96–112)
Creatinine, Ser: 1.57 mg/dL — ABNORMAL HIGH (ref 0.40–1.20)
Eosinophils Absolute: 0.3 10*3/uL (ref 0.0–0.7)
Eosinophils Relative: 5 % (ref 0–5)
Glucose, Bld: 121 mg/dL — ABNORMAL HIGH (ref 70–99)
HCT: 32.4 % — ABNORMAL LOW (ref 36.0–46.0)
Hemoglobin: 10 g/dL — ABNORMAL LOW (ref 12.0–15.0)
Lymphocytes Relative: 46 % (ref 12–46)
Lymphs Abs: 2.6 10*3/uL (ref 0.7–3.3)
MCHC: 30.9 g/dL (ref 30.0–36.0)
MCV: 98.5 fL (ref 78.0–100.0)
Monocytes Absolute: 0.4 10*3/uL (ref 0.2–0.7)
Monocytes Relative: 7 % (ref 3–11)
Neutro Abs: 2.4 10*3/uL (ref 1.7–7.7)
Neutrophils Relative %: 42 % — ABNORMAL LOW (ref 43–77)
Platelets: 236 10*3/uL (ref 150–400)
Potassium: 3.9 meq/L (ref 3.5–5.3)
RBC: 3.29 M/uL — ABNORMAL LOW (ref 3.87–5.11)
RDW: 18.1 % — ABNORMAL HIGH (ref 11.5–14.0)
Sodium: 142 meq/L (ref 135–145)
Total Bilirubin: 0.2 mg/dL — ABNORMAL LOW (ref 0.3–1.2)
Total Protein: 7 g/dL (ref 6.0–8.3)
WBC: 5.7 10*3/uL (ref 4.0–10.5)

## 2007-05-16 ENCOUNTER — Telehealth: Payer: Self-pay | Admitting: *Deleted

## 2007-05-21 ENCOUNTER — Telehealth: Payer: Self-pay | Admitting: *Deleted

## 2007-06-12 ENCOUNTER — Ambulatory Visit (HOSPITAL_COMMUNITY): Admission: RE | Admit: 2007-06-12 | Discharge: 2007-06-12 | Payer: Self-pay | Admitting: Thoracic Surgery

## 2007-06-12 ENCOUNTER — Ambulatory Visit: Payer: Self-pay | Admitting: Thoracic Surgery

## 2007-07-03 ENCOUNTER — Ambulatory Visit: Payer: Self-pay | Admitting: Hospitalist

## 2007-07-03 ENCOUNTER — Encounter (INDEPENDENT_AMBULATORY_CARE_PROVIDER_SITE_OTHER): Payer: Self-pay | Admitting: *Deleted

## 2007-07-04 LAB — CONVERTED CEMR LAB
BUN: 15 mg/dL (ref 6–23)
CO2: 24 meq/L (ref 19–32)
Calcium: 9.1 mg/dL (ref 8.4–10.5)
Chloride: 108 meq/L (ref 96–112)
Creatinine, Ser: 1.3 mg/dL — ABNORMAL HIGH (ref 0.40–1.20)
Glucose, Bld: 96 mg/dL (ref 70–99)
Potassium: 4.1 meq/L (ref 3.5–5.3)
Sodium: 144 meq/L (ref 135–145)

## 2007-08-02 ENCOUNTER — Telehealth: Payer: Self-pay | Admitting: Infectious Diseases

## 2007-08-09 ENCOUNTER — Ambulatory Visit: Payer: Self-pay | Admitting: Internal Medicine

## 2007-09-05 ENCOUNTER — Inpatient Hospital Stay (HOSPITAL_COMMUNITY): Admission: EM | Admit: 2007-09-05 | Discharge: 2007-09-11 | Payer: Self-pay | Admitting: Nurse Practitioner

## 2007-09-05 ENCOUNTER — Ambulatory Visit: Payer: Self-pay | Admitting: Critical Care Medicine

## 2007-09-05 ENCOUNTER — Ambulatory Visit: Payer: Self-pay | Admitting: Internal Medicine

## 2007-09-05 ENCOUNTER — Ambulatory Visit: Payer: Self-pay | Admitting: Cardiology

## 2007-09-06 ENCOUNTER — Encounter: Payer: Self-pay | Admitting: Internal Medicine

## 2007-09-07 ENCOUNTER — Encounter (INDEPENDENT_AMBULATORY_CARE_PROVIDER_SITE_OTHER): Payer: Self-pay | Admitting: *Deleted

## 2007-09-17 ENCOUNTER — Telehealth: Payer: Self-pay | Admitting: *Deleted

## 2007-09-25 ENCOUNTER — Ambulatory Visit: Payer: Self-pay | Admitting: Internal Medicine

## 2007-09-25 ENCOUNTER — Encounter (INDEPENDENT_AMBULATORY_CARE_PROVIDER_SITE_OTHER): Payer: Self-pay | Admitting: *Deleted

## 2007-09-25 DIAGNOSIS — D649 Anemia, unspecified: Secondary | ICD-10-CM | POA: Insufficient documentation

## 2007-09-25 LAB — CONVERTED CEMR LAB
BUN: 17 mg/dL (ref 6–23)
CO2: 23 meq/L (ref 19–32)
Calcium: 9.1 mg/dL (ref 8.4–10.5)
Chloride: 108 meq/L (ref 96–112)
Creatinine, Ser: 1.48 mg/dL — ABNORMAL HIGH (ref 0.40–1.20)
Glucose, Bld: 99 mg/dL (ref 70–99)
HCT: 29.5 % — ABNORMAL LOW (ref 36.0–46.0)
Hemoglobin: 9.6 g/dL — ABNORMAL LOW (ref 12.0–15.0)
MCHC: 32.6 g/dL (ref 30.0–36.0)
MCV: 93.6 fL (ref 78.0–100.0)
Platelets: 427 10*3/uL — ABNORMAL HIGH (ref 150–400)
Potassium: 4.4 meq/L (ref 3.5–5.3)
RBC: 3.15 M/uL — ABNORMAL LOW (ref 3.87–5.11)
RDW: 17.3 % — ABNORMAL HIGH (ref 11.5–15.5)
Sodium: 140 meq/L (ref 135–145)
WBC: 5.9 10*3/uL (ref 4.0–10.5)

## 2007-10-01 ENCOUNTER — Telehealth (INDEPENDENT_AMBULATORY_CARE_PROVIDER_SITE_OTHER): Payer: Self-pay | Admitting: *Deleted

## 2007-10-18 ENCOUNTER — Telehealth (INDEPENDENT_AMBULATORY_CARE_PROVIDER_SITE_OTHER): Payer: Self-pay | Admitting: *Deleted

## 2007-10-28 ENCOUNTER — Telehealth: Payer: Self-pay | Admitting: Internal Medicine

## 2007-11-20 ENCOUNTER — Encounter (INDEPENDENT_AMBULATORY_CARE_PROVIDER_SITE_OTHER): Payer: Self-pay | Admitting: Internal Medicine

## 2007-11-20 ENCOUNTER — Ambulatory Visit: Payer: Self-pay | Admitting: Internal Medicine

## 2007-11-20 ENCOUNTER — Ambulatory Visit (HOSPITAL_COMMUNITY): Admission: RE | Admit: 2007-11-20 | Discharge: 2007-11-20 | Payer: Self-pay | Admitting: Internal Medicine

## 2007-11-22 LAB — CONVERTED CEMR LAB
BUN: 17 mg/dL (ref 6–23)
Basophils Absolute: 0 10*3/uL (ref 0.0–0.1)
Basophils Relative: 0 % (ref 0–1)
CO2: 22 meq/L (ref 19–32)
Calcium: 8.3 mg/dL — ABNORMAL LOW (ref 8.4–10.5)
Chloride: 101 meq/L (ref 96–112)
Creatinine, Ser: 1.26 mg/dL — ABNORMAL HIGH (ref 0.40–1.20)
Eosinophils Absolute: 0.2 10*3/uL (ref 0.0–0.7)
Eosinophils Relative: 2 % (ref 0–5)
Glucose, Bld: 121 mg/dL — ABNORMAL HIGH (ref 70–99)
HCT: 34.6 % — ABNORMAL LOW (ref 36.0–46.0)
Hemoglobin: 10.9 g/dL — ABNORMAL LOW (ref 12.0–15.0)
Lymphocytes Relative: 21 % (ref 12–46)
Lymphs Abs: 2.3 10*3/uL (ref 0.7–4.0)
MCHC: 31.5 g/dL (ref 30.0–36.0)
MCV: 94 fL (ref 78.0–100.0)
Monocytes Absolute: 0.7 10*3/uL (ref 0.1–1.0)
Monocytes Relative: 7 % (ref 3–12)
Neutro Abs: 7.7 10*3/uL (ref 1.7–7.7)
Neutrophils Relative %: 71 % (ref 43–77)
Platelets: 230 10*3/uL (ref 150–400)
Potassium: 4 meq/L (ref 3.5–5.3)
RBC: 3.68 M/uL — ABNORMAL LOW (ref 3.87–5.11)
RDW: 15.7 % — ABNORMAL HIGH (ref 11.5–15.5)
Sodium: 137 meq/L (ref 135–145)
WBC: 10.9 10*3/uL — ABNORMAL HIGH (ref 4.0–10.5)

## 2007-11-28 ENCOUNTER — Encounter (INDEPENDENT_AMBULATORY_CARE_PROVIDER_SITE_OTHER): Payer: Self-pay | Admitting: Internal Medicine

## 2007-11-28 ENCOUNTER — Ambulatory Visit: Payer: Self-pay | Admitting: Internal Medicine

## 2007-11-28 DIAGNOSIS — R300 Dysuria: Secondary | ICD-10-CM | POA: Insufficient documentation

## 2007-11-28 LAB — CONVERTED CEMR LAB
Bilirubin Urine: NEGATIVE
Glucose, Urine, Semiquant: NEGATIVE
Ketones, urine, test strip: NEGATIVE
Nitrite: NEGATIVE
Specific Gravity, Urine: 1.015
Urobilinogen, UA: 0.2
pH: 5

## 2007-11-29 LAB — CONVERTED CEMR LAB
Candida species: NEGATIVE
Gardnerella vaginalis: POSITIVE — AB
Trichomonal Vaginitis: NEGATIVE

## 2007-12-03 ENCOUNTER — Telehealth (INDEPENDENT_AMBULATORY_CARE_PROVIDER_SITE_OTHER): Payer: Self-pay | Admitting: *Deleted

## 2007-12-11 ENCOUNTER — Ambulatory Visit (HOSPITAL_COMMUNITY): Admission: RE | Admit: 2007-12-11 | Discharge: 2007-12-11 | Payer: Self-pay | Admitting: Thoracic Surgery

## 2007-12-11 ENCOUNTER — Ambulatory Visit: Payer: Self-pay | Admitting: Thoracic Surgery

## 2007-12-19 ENCOUNTER — Ambulatory Visit (HOSPITAL_COMMUNITY): Admission: RE | Admit: 2007-12-19 | Discharge: 2007-12-19 | Payer: Self-pay | Admitting: Infectious Diseases

## 2008-01-02 ENCOUNTER — Ambulatory Visit: Payer: Self-pay | Admitting: Hospitalist

## 2008-01-13 ENCOUNTER — Telehealth (INDEPENDENT_AMBULATORY_CARE_PROVIDER_SITE_OTHER): Payer: Self-pay | Admitting: *Deleted

## 2008-01-22 ENCOUNTER — Encounter (INDEPENDENT_AMBULATORY_CARE_PROVIDER_SITE_OTHER): Payer: Self-pay | Admitting: *Deleted

## 2008-02-14 ENCOUNTER — Ambulatory Visit: Payer: Self-pay | Admitting: Internal Medicine

## 2008-03-13 ENCOUNTER — Encounter (INDEPENDENT_AMBULATORY_CARE_PROVIDER_SITE_OTHER): Payer: Self-pay | Admitting: *Deleted

## 2008-03-13 ENCOUNTER — Ambulatory Visit: Payer: Self-pay | Admitting: Infectious Disease

## 2008-03-13 DIAGNOSIS — L538 Other specified erythematous conditions: Secondary | ICD-10-CM | POA: Insufficient documentation

## 2008-03-20 ENCOUNTER — Ambulatory Visit (HOSPITAL_COMMUNITY): Admission: RE | Admit: 2008-03-20 | Discharge: 2008-03-20 | Payer: Self-pay | Admitting: Neurosurgery

## 2008-04-07 ENCOUNTER — Encounter: Admission: RE | Admit: 2008-04-07 | Discharge: 2008-06-09 | Payer: Self-pay | Admitting: Neurosurgery

## 2008-04-16 ENCOUNTER — Telehealth (INDEPENDENT_AMBULATORY_CARE_PROVIDER_SITE_OTHER): Payer: Self-pay | Admitting: *Deleted

## 2008-04-22 ENCOUNTER — Encounter (INDEPENDENT_AMBULATORY_CARE_PROVIDER_SITE_OTHER): Payer: Self-pay | Admitting: Internal Medicine

## 2008-04-29 ENCOUNTER — Encounter: Payer: Self-pay | Admitting: Internal Medicine

## 2008-04-29 ENCOUNTER — Ambulatory Visit (HOSPITAL_COMMUNITY): Admission: RE | Admit: 2008-04-29 | Discharge: 2008-04-29 | Payer: Self-pay | Admitting: Internal Medicine

## 2008-04-29 ENCOUNTER — Ambulatory Visit: Payer: Self-pay | Admitting: Internal Medicine

## 2008-04-29 DIAGNOSIS — Z79891 Long term (current) use of opiate analgesic: Secondary | ICD-10-CM | POA: Insufficient documentation

## 2008-04-29 LAB — CONVERTED CEMR LAB
ALT: 11 units/L (ref 0–35)
AST: 16 units/L (ref 0–37)
Albumin: 4 g/dL (ref 3.5–5.2)
Alkaline Phosphatase: 111 units/L (ref 39–117)
BUN: 16 mg/dL (ref 6–23)
Basophils Absolute: 0 10*3/uL (ref 0.0–0.1)
Basophils Relative: 0 % (ref 0–1)
CO2: 25 meq/L (ref 19–32)
Calcium: 9.5 mg/dL (ref 8.4–10.5)
Chloride: 106 meq/L (ref 96–112)
Cholesterol: 205 mg/dL — ABNORMAL HIGH (ref 0–200)
Creatinine, Ser: 1 mg/dL (ref 0.40–1.20)
Eosinophils Absolute: 0.3 10*3/uL (ref 0.0–0.7)
Eosinophils Relative: 4 % (ref 0–5)
Glucose, Bld: 103 mg/dL — ABNORMAL HIGH (ref 70–99)
HCT: 35.6 % — ABNORMAL LOW (ref 36.0–46.0)
HDL: 46 mg/dL (ref >39)
Hemoglobin: 11.3 g/dL — ABNORMAL LOW (ref 12.0–15.0)
LDL Cholesterol: 127 mg/dL — ABNORMAL HIGH (ref 0–99)
Lymphocytes Relative: 38 % (ref 12–46)
Lymphs Abs: 3.2 10*3/uL (ref 0.7–4.0)
MCHC: 31.7 g/dL (ref 30.0–36.0)
MCV: 93 fL (ref 78.0–100.0)
Monocytes Absolute: 0.5 10*3/uL (ref 0.1–1.0)
Monocytes Relative: 6 % (ref 3–12)
Neutro Abs: 4.4 10*3/uL (ref 1.7–7.7)
Neutrophils Relative %: 52 % (ref 43–77)
Platelets: 273 10*3/uL (ref 150–400)
Potassium: 4.4 meq/L (ref 3.5–5.3)
RBC: 3.83 M/uL — ABNORMAL LOW (ref 3.87–5.11)
RDW: 16.3 % — ABNORMAL HIGH (ref 11.5–15.5)
Sodium: 140 meq/L (ref 135–145)
Total Bilirubin: 0.3 mg/dL (ref 0.3–1.2)
Total CHOL/HDL Ratio: 4.5
Total Protein: 7.7 g/dL (ref 6.0–8.3)
Triglycerides: 158 mg/dL — ABNORMAL HIGH (ref <150)
VLDL: 32 mg/dL (ref 0–40)
WBC: 8.4 10*3/uL (ref 4.0–10.5)

## 2008-05-13 ENCOUNTER — Ambulatory Visit: Payer: Self-pay | Admitting: Internal Medicine

## 2008-06-16 ENCOUNTER — Telehealth (INDEPENDENT_AMBULATORY_CARE_PROVIDER_SITE_OTHER): Payer: Self-pay | Admitting: Internal Medicine

## 2008-06-19 ENCOUNTER — Encounter: Payer: Self-pay | Admitting: Internal Medicine

## 2008-06-19 ENCOUNTER — Ambulatory Visit: Payer: Self-pay | Admitting: *Deleted

## 2008-06-19 DIAGNOSIS — R609 Edema, unspecified: Secondary | ICD-10-CM | POA: Insufficient documentation

## 2008-06-19 LAB — CONVERTED CEMR LAB
ALT: 14 units/L (ref 0–35)
AST: 18 units/L (ref 0–37)
Albumin: 4.2 g/dL (ref 3.5–5.2)
Alkaline Phosphatase: 120 units/L — ABNORMAL HIGH (ref 39–117)
BUN: 24 mg/dL — ABNORMAL HIGH (ref 6–23)
CO2: 24 meq/L (ref 19–32)
Calcium: 9.6 mg/dL (ref 8.4–10.5)
Chloride: 105 meq/L (ref 96–112)
Creatinine, Ser: 1.5 mg/dL — ABNORMAL HIGH (ref 0.40–1.20)
Creatinine, Urine: 32.6 mg/dL
Glucose, Bld: 96 mg/dL (ref 70–99)
Microalb Creat Ratio: 6.1 mg/g (ref 0.0–30.0)
Microalb, Ur: 0.2 mg/dL (ref 0.00–1.89)
Potassium: 4.1 meq/L (ref 3.5–5.3)
Sodium: 142 meq/L (ref 135–145)
Total Bilirubin: 0.3 mg/dL (ref 0.3–1.2)
Total Protein: 7.7 g/dL (ref 6.0–8.3)

## 2008-06-25 ENCOUNTER — Encounter (INDEPENDENT_AMBULATORY_CARE_PROVIDER_SITE_OTHER): Payer: Self-pay | Admitting: *Deleted

## 2008-06-25 ENCOUNTER — Telehealth (INDEPENDENT_AMBULATORY_CARE_PROVIDER_SITE_OTHER): Payer: Self-pay | Admitting: Internal Medicine

## 2008-06-25 ENCOUNTER — Ambulatory Visit (HOSPITAL_COMMUNITY): Admission: RE | Admit: 2008-06-25 | Discharge: 2008-06-25 | Payer: Self-pay | Admitting: *Deleted

## 2008-06-29 ENCOUNTER — Telehealth (INDEPENDENT_AMBULATORY_CARE_PROVIDER_SITE_OTHER): Payer: Self-pay | Admitting: Internal Medicine

## 2008-07-09 ENCOUNTER — Ambulatory Visit: Payer: Self-pay | Admitting: Internal Medicine

## 2008-07-09 ENCOUNTER — Ambulatory Visit (HOSPITAL_COMMUNITY): Admission: RE | Admit: 2008-07-09 | Discharge: 2008-07-09 | Payer: Self-pay | Admitting: Internal Medicine

## 2008-07-09 ENCOUNTER — Encounter (INDEPENDENT_AMBULATORY_CARE_PROVIDER_SITE_OTHER): Payer: Self-pay | Admitting: *Deleted

## 2008-07-09 LAB — CONVERTED CEMR LAB
BUN: 16 mg/dL (ref 6–23)
Basophils Absolute: 0 10*3/uL (ref 0.0–0.1)
Basophils Relative: 1 % (ref 0–1)
CO2: 25 meq/L (ref 19–32)
Calcium: 9.4 mg/dL (ref 8.4–10.5)
Chloride: 104 meq/L (ref 96–112)
Creatinine, Ser: 1.23 mg/dL — ABNORMAL HIGH (ref 0.40–1.20)
Eosinophils Absolute: 0.3 10*3/uL (ref 0.0–0.7)
Eosinophils Relative: 5 % (ref 0–5)
Glucose, Bld: 105 mg/dL — ABNORMAL HIGH (ref 70–99)
HCT: 34.5 % — ABNORMAL LOW (ref 36.0–46.0)
Hemoglobin: 11 g/dL — ABNORMAL LOW (ref 12.0–15.0)
Lymphocytes Relative: 28 % (ref 12–46)
Lymphs Abs: 1.8 10*3/uL (ref 0.7–4.0)
MCHC: 31.9 g/dL (ref 30.0–36.0)
MCV: 93 fL (ref 78.0–100.0)
Monocytes Absolute: 0.6 10*3/uL (ref 0.1–1.0)
Monocytes Relative: 9 % (ref 3–12)
Neutro Abs: 3.6 10*3/uL (ref 1.7–7.7)
Neutrophils Relative %: 57 % (ref 43–77)
Platelets: 240 10*3/uL (ref 150–400)
Potassium: 4 meq/L (ref 3.5–5.3)
RBC: 3.71 M/uL — ABNORMAL LOW (ref 3.87–5.11)
RDW: 16.6 % — ABNORMAL HIGH (ref 11.5–15.5)
Sodium: 140 meq/L (ref 135–145)
WBC: 6.4 10*3/uL (ref 4.0–10.5)

## 2008-07-10 ENCOUNTER — Encounter (INDEPENDENT_AMBULATORY_CARE_PROVIDER_SITE_OTHER): Payer: Self-pay | Admitting: Internal Medicine

## 2008-07-27 ENCOUNTER — Ambulatory Visit: Payer: Self-pay | Admitting: Internal Medicine

## 2008-09-29 ENCOUNTER — Encounter (INDEPENDENT_AMBULATORY_CARE_PROVIDER_SITE_OTHER): Payer: Self-pay | Admitting: Internal Medicine

## 2008-09-29 ENCOUNTER — Ambulatory Visit: Payer: Self-pay | Admitting: Internal Medicine

## 2008-09-29 LAB — CONVERTED CEMR LAB
BUN: 21 mg/dL (ref 6–23)
CO2: 22 meq/L (ref 19–32)
Calcium: 9.7 mg/dL (ref 8.4–10.5)
Chloride: 105 meq/L (ref 96–112)
Creatinine, Ser: 1.26 mg/dL — ABNORMAL HIGH (ref 0.40–1.20)
Glucose, Bld: 98 mg/dL (ref 70–99)
HCT: 35.2 % — ABNORMAL LOW (ref 36.0–46.0)
Hemoglobin: 11 g/dL — ABNORMAL LOW (ref 12.0–15.0)
MCHC: 31.3 g/dL (ref 30.0–36.0)
MCV: 93.6 fL (ref 78.0–100.0)
Platelets: 290 10*3/uL (ref 150–400)
Potassium: 4.8 meq/L (ref 3.5–5.3)
RBC: 3.76 M/uL — ABNORMAL LOW (ref 3.87–5.11)
RDW: 15.8 % — ABNORMAL HIGH (ref 11.5–15.5)
Sodium: 142 meq/L (ref 135–145)
TSH: 3.821 microintl units/mL (ref 0.350–4.50)
WBC: 7.9 10*3/uL (ref 4.0–10.5)

## 2008-10-05 ENCOUNTER — Telehealth (INDEPENDENT_AMBULATORY_CARE_PROVIDER_SITE_OTHER): Payer: Self-pay | Admitting: Internal Medicine

## 2008-10-09 ENCOUNTER — Telehealth (INDEPENDENT_AMBULATORY_CARE_PROVIDER_SITE_OTHER): Payer: Self-pay | Admitting: Internal Medicine

## 2008-11-27 ENCOUNTER — Telehealth (INDEPENDENT_AMBULATORY_CARE_PROVIDER_SITE_OTHER): Payer: Self-pay | Admitting: Internal Medicine

## 2008-12-07 ENCOUNTER — Telehealth (INDEPENDENT_AMBULATORY_CARE_PROVIDER_SITE_OTHER): Payer: Self-pay | Admitting: *Deleted

## 2008-12-14 ENCOUNTER — Telehealth (INDEPENDENT_AMBULATORY_CARE_PROVIDER_SITE_OTHER): Payer: Self-pay | Admitting: Internal Medicine

## 2008-12-15 ENCOUNTER — Ambulatory Visit: Payer: Self-pay | Admitting: Internal Medicine

## 2008-12-15 ENCOUNTER — Encounter (INDEPENDENT_AMBULATORY_CARE_PROVIDER_SITE_OTHER): Payer: Self-pay | Admitting: Internal Medicine

## 2008-12-15 LAB — CONVERTED CEMR LAB
ALT: 12 units/L (ref 0–35)
AST: 14 units/L (ref 0–37)
Albumin: 4.2 g/dL (ref 3.5–5.2)
Alkaline Phosphatase: 104 units/L (ref 39–117)
BUN: 25 mg/dL — ABNORMAL HIGH (ref 6–23)
CO2: 23 meq/L (ref 19–32)
Calcium: 9.5 mg/dL (ref 8.4–10.5)
Chloride: 105 meq/L (ref 96–112)
Cholesterol: 177 mg/dL (ref 0–200)
Creatinine, Ser: 1.13 mg/dL (ref 0.40–1.20)
Glucose, Bld: 93 mg/dL (ref 70–99)
HDL: 43 mg/dL (ref >39)
LDL Cholesterol: 92 mg/dL (ref 0–99)
Potassium: 4.2 meq/L (ref 3.5–5.3)
Sodium: 141 meq/L (ref 135–145)
Total Bilirubin: 0.3 mg/dL (ref 0.3–1.2)
Total CHOL/HDL Ratio: 4.1
Total Protein: 7.1 g/dL (ref 6.0–8.3)
Triglycerides: 208 mg/dL — ABNORMAL HIGH (ref <150)
VLDL: 42 mg/dL — ABNORMAL HIGH (ref 0–40)

## 2008-12-22 ENCOUNTER — Ambulatory Visit (HOSPITAL_COMMUNITY): Admission: RE | Admit: 2008-12-22 | Discharge: 2008-12-22 | Payer: Self-pay | Admitting: Internal Medicine

## 2009-02-04 ENCOUNTER — Telehealth (INDEPENDENT_AMBULATORY_CARE_PROVIDER_SITE_OTHER): Payer: Self-pay | Admitting: Internal Medicine

## 2009-02-11 ENCOUNTER — Telehealth (INDEPENDENT_AMBULATORY_CARE_PROVIDER_SITE_OTHER): Payer: Self-pay | Admitting: Internal Medicine

## 2009-02-23 ENCOUNTER — Telehealth: Payer: Self-pay | Admitting: Internal Medicine

## 2009-02-23 ENCOUNTER — Ambulatory Visit: Payer: Self-pay | Admitting: Internal Medicine

## 2009-02-23 ENCOUNTER — Encounter (INDEPENDENT_AMBULATORY_CARE_PROVIDER_SITE_OTHER): Payer: Self-pay | Admitting: Internal Medicine

## 2009-02-23 LAB — CONVERTED CEMR LAB
ALT: 20 units/L (ref 0–35)
AST: 24 units/L (ref 0–37)
Albumin: 4.2 g/dL (ref 3.5–5.2)
Alkaline Phosphatase: 104 units/L (ref 39–117)
BUN: 15 mg/dL (ref 6–23)
CO2: 23 meq/L (ref 19–32)
Calcium: 10 mg/dL (ref 8.4–10.5)
Chloride: 106 meq/L (ref 96–112)
Creatinine, Ser: 1.05 mg/dL (ref 0.40–1.20)
GFR calc Af Amer: >60 mL/min (ref >60)
GFR calc non Af Amer: 52 mL/min — ABNORMAL LOW (ref >60)
Glucose, Bld: 99 mg/dL (ref 70–99)
HCT: 35.1 % — ABNORMAL LOW (ref 36.0–46.0)
Hemoglobin: 11.4 g/dL — ABNORMAL LOW (ref 12.0–15.0)
MCHC: 32.5 g/dL (ref 30.0–36.0)
MCV: 92.1 fL (ref 78.0–100.0)
Platelets: 247 10*3/uL (ref 150–400)
Potassium: 4.4 meq/L (ref 3.5–5.3)
RBC: 3.81 M/uL — ABNORMAL LOW (ref 3.87–5.11)
RDW: 15.7 % — ABNORMAL HIGH (ref 11.5–15.5)
Sodium: 141 meq/L (ref 135–145)
TSH: 1.713 microintl units/mL (ref 0.350–4.500)
Total Bilirubin: 0.4 mg/dL (ref 0.3–1.2)
Total Protein: 7.4 g/dL (ref 6.0–8.3)
WBC: 6.8 10*3/uL (ref 4.0–10.5)

## 2009-02-25 ENCOUNTER — Encounter (INDEPENDENT_AMBULATORY_CARE_PROVIDER_SITE_OTHER): Payer: Self-pay | Admitting: Internal Medicine

## 2009-02-26 ENCOUNTER — Telehealth: Payer: Self-pay | Admitting: *Deleted

## 2009-03-16 ENCOUNTER — Telehealth (INDEPENDENT_AMBULATORY_CARE_PROVIDER_SITE_OTHER): Payer: Self-pay | Admitting: Internal Medicine

## 2009-03-23 ENCOUNTER — Telehealth (INDEPENDENT_AMBULATORY_CARE_PROVIDER_SITE_OTHER): Payer: Self-pay | Admitting: Internal Medicine

## 2009-04-05 ENCOUNTER — Encounter: Admission: RE | Admit: 2009-04-05 | Discharge: 2009-07-04 | Payer: Self-pay | Admitting: Internal Medicine

## 2009-04-13 ENCOUNTER — Telehealth (INDEPENDENT_AMBULATORY_CARE_PROVIDER_SITE_OTHER): Payer: Self-pay | Admitting: Internal Medicine

## 2009-04-13 DIAGNOSIS — H547 Unspecified visual loss: Secondary | ICD-10-CM | POA: Insufficient documentation

## 2009-04-21 ENCOUNTER — Encounter (INDEPENDENT_AMBULATORY_CARE_PROVIDER_SITE_OTHER): Payer: Self-pay | Admitting: Internal Medicine

## 2009-04-29 ENCOUNTER — Telehealth (INDEPENDENT_AMBULATORY_CARE_PROVIDER_SITE_OTHER): Payer: Self-pay | Admitting: Internal Medicine

## 2009-05-07 ENCOUNTER — Ambulatory Visit: Payer: Self-pay | Admitting: Internal Medicine

## 2009-05-14 ENCOUNTER — Ambulatory Visit: Payer: Self-pay | Admitting: Internal Medicine

## 2009-05-14 ENCOUNTER — Encounter (INDEPENDENT_AMBULATORY_CARE_PROVIDER_SITE_OTHER): Payer: Self-pay | Admitting: Internal Medicine

## 2009-05-14 DIAGNOSIS — IMO0001 Reserved for inherently not codable concepts without codable children: Secondary | ICD-10-CM | POA: Insufficient documentation

## 2009-05-14 LAB — CONVERTED CEMR LAB
ALT: 13 units/L (ref 0–35)
AST: 14 units/L (ref 0–37)
Albumin: 4 g/dL (ref 3.5–5.2)
Alkaline Phosphatase: 97 units/L (ref 39–117)
BUN: 34 mg/dL — ABNORMAL HIGH (ref 6–23)
CO2: 24 meq/L (ref 19–32)
Calcium: 9.9 mg/dL (ref 8.4–10.5)
Chloride: 107 meq/L (ref 96–112)
Creatinine, Ser: 1.24 mg/dL — ABNORMAL HIGH (ref 0.40–1.20)
Glucose, Bld: 112 mg/dL — ABNORMAL HIGH (ref 70–99)
HCT: 32.8 % — ABNORMAL LOW (ref 36.0–46.0)
Hemoglobin: 10.7 g/dL — ABNORMAL LOW (ref 12.0–15.0)
MCHC: 32.6 g/dL (ref 30.0–36.0)
MCV: 91.9 fL (ref 78.0–100.0)
Platelets: 240 10*3/uL (ref 150–400)
Potassium: 4.6 meq/L (ref 3.5–5.3)
RBC: 3.57 M/uL — ABNORMAL LOW (ref 3.87–5.11)
RDW: 16.4 % — ABNORMAL HIGH (ref 11.5–15.5)
Sodium: 141 meq/L (ref 135–145)
Total Bilirubin: 0.5 mg/dL (ref 0.3–1.2)
Total Protein: 7.1 g/dL (ref 6.0–8.3)
WBC: 7.7 10*3/uL (ref 4.0–10.5)

## 2009-05-28 ENCOUNTER — Telehealth (INDEPENDENT_AMBULATORY_CARE_PROVIDER_SITE_OTHER): Payer: Self-pay | Admitting: Internal Medicine

## 2009-07-12 ENCOUNTER — Telehealth (INDEPENDENT_AMBULATORY_CARE_PROVIDER_SITE_OTHER): Payer: Self-pay | Admitting: *Deleted

## 2009-07-12 ENCOUNTER — Ambulatory Visit: Payer: Self-pay | Admitting: Infectious Diseases

## 2009-07-12 ENCOUNTER — Ambulatory Visit (HOSPITAL_COMMUNITY): Admission: RE | Admit: 2009-07-12 | Discharge: 2009-07-12 | Payer: Self-pay | Admitting: Infectious Diseases

## 2009-07-26 ENCOUNTER — Ambulatory Visit: Payer: Self-pay | Admitting: Infectious Diseases

## 2009-07-30 ENCOUNTER — Telehealth (INDEPENDENT_AMBULATORY_CARE_PROVIDER_SITE_OTHER): Payer: Self-pay | Admitting: Internal Medicine

## 2009-08-04 ENCOUNTER — Ambulatory Visit (HOSPITAL_COMMUNITY): Admission: RE | Admit: 2009-08-04 | Discharge: 2009-08-04 | Payer: Self-pay | Admitting: Orthopaedic Surgery

## 2009-08-05 ENCOUNTER — Encounter (INDEPENDENT_AMBULATORY_CARE_PROVIDER_SITE_OTHER): Payer: Self-pay | Admitting: Internal Medicine

## 2009-09-10 ENCOUNTER — Telehealth (INDEPENDENT_AMBULATORY_CARE_PROVIDER_SITE_OTHER): Payer: Self-pay | Admitting: Internal Medicine

## 2009-09-21 ENCOUNTER — Telehealth (INDEPENDENT_AMBULATORY_CARE_PROVIDER_SITE_OTHER): Payer: Self-pay | Admitting: Internal Medicine

## 2009-09-27 ENCOUNTER — Telehealth (INDEPENDENT_AMBULATORY_CARE_PROVIDER_SITE_OTHER): Payer: Self-pay | Admitting: Internal Medicine

## 2009-10-07 ENCOUNTER — Encounter (INDEPENDENT_AMBULATORY_CARE_PROVIDER_SITE_OTHER): Payer: Self-pay | Admitting: Internal Medicine

## 2009-10-14 ENCOUNTER — Ambulatory Visit: Payer: Self-pay | Admitting: Infectious Diseases

## 2009-10-14 DIAGNOSIS — J309 Allergic rhinitis, unspecified: Secondary | ICD-10-CM | POA: Insufficient documentation

## 2009-10-26 ENCOUNTER — Telehealth (INDEPENDENT_AMBULATORY_CARE_PROVIDER_SITE_OTHER): Payer: Self-pay | Admitting: *Deleted

## 2009-11-29 ENCOUNTER — Telehealth (INDEPENDENT_AMBULATORY_CARE_PROVIDER_SITE_OTHER): Payer: Self-pay | Admitting: Internal Medicine

## 2009-11-29 ENCOUNTER — Encounter (INDEPENDENT_AMBULATORY_CARE_PROVIDER_SITE_OTHER): Payer: Self-pay | Admitting: Internal Medicine

## 2009-12-01 ENCOUNTER — Ambulatory Visit (HOSPITAL_COMMUNITY): Admission: RE | Admit: 2009-12-01 | Discharge: 2009-12-01 | Payer: Self-pay | Admitting: Internal Medicine

## 2009-12-09 ENCOUNTER — Telehealth (INDEPENDENT_AMBULATORY_CARE_PROVIDER_SITE_OTHER): Payer: Self-pay | Admitting: Internal Medicine

## 2009-12-09 ENCOUNTER — Ambulatory Visit: Payer: Self-pay | Admitting: Internal Medicine

## 2010-01-11 ENCOUNTER — Encounter (INDEPENDENT_AMBULATORY_CARE_PROVIDER_SITE_OTHER): Payer: Self-pay | Admitting: Internal Medicine

## 2010-01-11 ENCOUNTER — Encounter: Admission: RE | Admit: 2010-01-11 | Discharge: 2010-02-17 | Payer: Self-pay | Admitting: Internal Medicine

## 2010-01-19 ENCOUNTER — Telehealth (INDEPENDENT_AMBULATORY_CARE_PROVIDER_SITE_OTHER): Payer: Self-pay | Admitting: Internal Medicine

## 2010-01-24 ENCOUNTER — Telehealth (INDEPENDENT_AMBULATORY_CARE_PROVIDER_SITE_OTHER): Payer: Self-pay | Admitting: *Deleted

## 2010-02-07 ENCOUNTER — Telehealth (INDEPENDENT_AMBULATORY_CARE_PROVIDER_SITE_OTHER): Payer: Self-pay | Admitting: Internal Medicine

## 2010-02-08 ENCOUNTER — Ambulatory Visit (HOSPITAL_COMMUNITY): Admission: RE | Admit: 2010-02-08 | Discharge: 2010-02-08 | Payer: Self-pay | Admitting: Internal Medicine

## 2010-02-08 ENCOUNTER — Encounter (INDEPENDENT_AMBULATORY_CARE_PROVIDER_SITE_OTHER): Payer: Self-pay | Admitting: Internal Medicine

## 2010-02-08 ENCOUNTER — Ambulatory Visit: Payer: Self-pay | Admitting: Internal Medicine

## 2010-02-08 LAB — CONVERTED CEMR LAB
ALT: 21 units/L (ref 0–35)
AST: 21 units/L (ref 0–37)
Albumin: 3.7 g/dL (ref 3.5–5.2)
Alkaline Phosphatase: 93 units/L (ref 39–117)
Bilirubin, Direct: <0.1 mg/dL (ref 0.0–0.3)
Cholesterol: 199 mg/dL (ref 0–200)
HCT: 33.4 % — ABNORMAL LOW (ref 36.0–46.0)
HDL: 48 mg/dL (ref >39)
Hemoglobin: 11.4 g/dL — ABNORMAL LOW (ref 12.0–15.0)
Hgb A1c MFr Bld: 6.3 %
LDL Cholesterol: 116 mg/dL — ABNORMAL HIGH (ref 0–99)
MCHC: 34.1 g/dL (ref 30.0–36.0)
MCV: 95.9 fL (ref >=78.0)
Platelets: 214 10*3/uL (ref 150–400)
Pro B Natriuretic peptide (BNP): <30 pg/mL (ref 0.0–100.0)
RBC: 3.48 M/uL — ABNORMAL LOW (ref 3.87–5.11)
RDW: 16.6 % — ABNORMAL HIGH (ref 11.5–15.5)
TSH: 1.677 microintl units/mL (ref 0.350–4.5)
Total Bilirubin: 0.5 mg/dL (ref 0.3–1.2)
Total CHOL/HDL Ratio: 4.1
Total Protein: 6.6 g/dL (ref 6.0–8.3)
Triglycerides: 175 mg/dL — ABNORMAL HIGH (ref <150)
VLDL: 35 mg/dL (ref 0–40)
WBC: 7.3 10*3/uL (ref 4.0–10.5)

## 2010-02-11 ENCOUNTER — Telehealth (INDEPENDENT_AMBULATORY_CARE_PROVIDER_SITE_OTHER): Payer: Self-pay | Admitting: Internal Medicine

## 2010-03-01 ENCOUNTER — Telehealth (INDEPENDENT_AMBULATORY_CARE_PROVIDER_SITE_OTHER): Payer: Self-pay | Admitting: Internal Medicine

## 2010-03-08 ENCOUNTER — Ambulatory Visit (HOSPITAL_BASED_OUTPATIENT_CLINIC_OR_DEPARTMENT_OTHER): Admission: RE | Admit: 2010-03-08 | Discharge: 2010-03-08 | Payer: Self-pay | Admitting: *Deleted

## 2010-03-08 ENCOUNTER — Encounter (INDEPENDENT_AMBULATORY_CARE_PROVIDER_SITE_OTHER): Payer: Self-pay | Admitting: Internal Medicine

## 2010-03-09 ENCOUNTER — Telehealth: Payer: Self-pay | Admitting: *Deleted

## 2010-03-11 ENCOUNTER — Encounter (INDEPENDENT_AMBULATORY_CARE_PROVIDER_SITE_OTHER): Payer: Self-pay | Admitting: Internal Medicine

## 2010-03-11 ENCOUNTER — Encounter: Payer: Self-pay | Admitting: Licensed Clinical Social Worker

## 2010-03-11 ENCOUNTER — Ambulatory Visit: Payer: Self-pay | Admitting: Internal Medicine

## 2010-03-11 DIAGNOSIS — R0989 Other specified symptoms and signs involving the circulatory and respiratory systems: Secondary | ICD-10-CM | POA: Insufficient documentation

## 2010-03-11 DIAGNOSIS — R0609 Other forms of dyspnea: Secondary | ICD-10-CM | POA: Insufficient documentation

## 2010-03-12 ENCOUNTER — Telehealth: Payer: Self-pay | Admitting: Internal Medicine

## 2010-03-12 ENCOUNTER — Ambulatory Visit: Payer: Self-pay | Admitting: Internal Medicine

## 2010-03-16 ENCOUNTER — Telehealth: Payer: Self-pay | Admitting: Internal Medicine

## 2010-03-16 ENCOUNTER — Encounter (INDEPENDENT_AMBULATORY_CARE_PROVIDER_SITE_OTHER): Payer: Self-pay | Admitting: Internal Medicine

## 2010-03-16 ENCOUNTER — Ambulatory Visit: Payer: Self-pay | Admitting: Cardiology

## 2010-03-16 ENCOUNTER — Ambulatory Visit (HOSPITAL_COMMUNITY)
Admission: RE | Admit: 2010-03-16 | Discharge: 2010-03-16 | Payer: Self-pay | Source: Home / Self Care | Admitting: Internal Medicine

## 2010-03-21 ENCOUNTER — Ambulatory Visit: Payer: Self-pay | Admitting: Internal Medicine

## 2010-03-21 LAB — CONVERTED CEMR LAB
BUN: 23 mg/dL (ref 6–23)
CO2: 23 meq/L (ref 19–32)
Calcium: 9.2 mg/dL (ref 8.4–10.5)
Chloride: 107 meq/L (ref 96–112)
Creatinine, Ser: 1.16 mg/dL (ref 0.40–1.20)
Glucose, Bld: 92 mg/dL (ref 70–99)
Potassium: 4.6 meq/L (ref 3.5–5.3)
Sodium: 140 meq/L (ref 135–145)

## 2010-03-25 ENCOUNTER — Telehealth (INDEPENDENT_AMBULATORY_CARE_PROVIDER_SITE_OTHER): Payer: Self-pay | Admitting: Internal Medicine

## 2010-04-05 ENCOUNTER — Telehealth (INDEPENDENT_AMBULATORY_CARE_PROVIDER_SITE_OTHER): Payer: Self-pay | Admitting: Internal Medicine

## 2010-04-11 ENCOUNTER — Encounter: Payer: Self-pay | Admitting: Internal Medicine

## 2010-04-12 ENCOUNTER — Telehealth: Payer: Self-pay | Admitting: *Deleted

## 2010-05-05 ENCOUNTER — Telehealth: Payer: Self-pay | Admitting: *Deleted

## 2010-05-06 ENCOUNTER — Encounter: Payer: Self-pay | Admitting: Internal Medicine

## 2010-05-06 ENCOUNTER — Ambulatory Visit: Payer: Self-pay | Admitting: Internal Medicine

## 2010-05-06 DIAGNOSIS — I5032 Chronic diastolic (congestive) heart failure: Secondary | ICD-10-CM | POA: Insufficient documentation

## 2010-05-12 ENCOUNTER — Telehealth: Payer: Self-pay | Admitting: Internal Medicine

## 2010-05-23 ENCOUNTER — Encounter: Payer: Self-pay | Admitting: Internal Medicine

## 2010-05-23 ENCOUNTER — Telehealth: Payer: Self-pay | Admitting: *Deleted

## 2010-05-24 ENCOUNTER — Telehealth: Payer: Self-pay | Admitting: Internal Medicine

## 2010-05-26 ENCOUNTER — Ambulatory Visit: Payer: Self-pay | Admitting: Internal Medicine

## 2010-05-26 LAB — CONVERTED CEMR LAB: Hgb A1c MFr Bld: 6.2 %

## 2010-05-27 ENCOUNTER — Encounter: Payer: Self-pay | Admitting: Internal Medicine

## 2010-05-27 LAB — CONVERTED CEMR LAB
BUN: 21 mg/dL (ref 6–23)
CO2: 26 meq/L (ref 19–32)
Calcium: 9.4 mg/dL (ref 8.4–10.5)
Chloride: 105 meq/L (ref 96–112)
Creatinine, Ser: 1.46 mg/dL — ABNORMAL HIGH (ref 0.40–1.20)
Free T4: 1.06 ng/dL (ref 0.80–1.80)
Glucose, Bld: 99 mg/dL (ref 70–99)
Potassium: 4.3 meq/L (ref 3.5–5.3)
Pro B Natriuretic peptide (BNP): <30 pg/mL (ref 0.0–100.0)
Sodium: 140 meq/L (ref 135–145)
TSH: 4.785 microintl units/mL — ABNORMAL HIGH (ref 0.350–4.5)

## 2010-05-31 ENCOUNTER — Telehealth: Payer: Self-pay | Admitting: Internal Medicine

## 2010-06-08 ENCOUNTER — Telehealth: Payer: Self-pay | Admitting: Internal Medicine

## 2010-06-10 ENCOUNTER — Telehealth: Payer: Self-pay | Admitting: Internal Medicine

## 2010-06-15 ENCOUNTER — Telehealth: Payer: Self-pay | Admitting: Licensed Clinical Social Worker

## 2010-06-21 ENCOUNTER — Telehealth: Payer: Self-pay | Admitting: Licensed Clinical Social Worker

## 2010-06-23 ENCOUNTER — Telehealth: Payer: Self-pay | Admitting: *Deleted

## 2010-06-27 ENCOUNTER — Telehealth: Payer: Self-pay | Admitting: *Deleted

## 2010-06-28 ENCOUNTER — Telehealth: Payer: Self-pay | Admitting: Internal Medicine

## 2010-06-29 ENCOUNTER — Observation Stay (HOSPITAL_COMMUNITY): Admission: EM | Admit: 2010-06-29 | Discharge: 2010-06-30 | Payer: Self-pay | Admitting: Emergency Medicine

## 2010-06-29 ENCOUNTER — Encounter: Payer: Self-pay | Admitting: Internal Medicine

## 2010-06-30 ENCOUNTER — Encounter: Payer: Self-pay | Admitting: Internal Medicine

## 2010-06-30 DIAGNOSIS — K5730 Diverticulosis of large intestine without perforation or abscess without bleeding: Secondary | ICD-10-CM

## 2010-06-30 HISTORY — DX: Diverticulosis of large intestine without perforation or abscess without bleeding: K57.30

## 2010-07-01 ENCOUNTER — Encounter: Payer: Self-pay | Admitting: Internal Medicine

## 2010-07-05 ENCOUNTER — Encounter: Payer: Self-pay | Admitting: Internal Medicine

## 2010-07-05 ENCOUNTER — Ambulatory Visit: Payer: Self-pay | Admitting: Internal Medicine

## 2010-07-06 ENCOUNTER — Encounter: Payer: Self-pay | Admitting: Internal Medicine

## 2010-07-06 LAB — CONVERTED CEMR LAB
HCT: 32.1 % — ABNORMAL LOW (ref 36.0–46.0)
Hemoglobin: 10.5 g/dL — ABNORMAL LOW (ref 12.0–15.0)
MCHC: 32.7 g/dL (ref 30.0–36.0)
MCV: 95 fL (ref >=78.0)
Platelets: 215 10*3/uL (ref 150–400)
RBC: 3.38 M/uL — ABNORMAL LOW (ref 3.87–5.11)
RDW: 15.8 % — ABNORMAL HIGH (ref 11.5–15.5)
WBC: 7.7 10*3/uL (ref 4.0–10.5)

## 2010-07-11 ENCOUNTER — Telehealth (INDEPENDENT_AMBULATORY_CARE_PROVIDER_SITE_OTHER): Payer: Self-pay | Admitting: *Deleted

## 2010-07-14 ENCOUNTER — Telehealth: Payer: Self-pay | Admitting: *Deleted

## 2010-07-20 ENCOUNTER — Ambulatory Visit: Payer: Self-pay | Admitting: Internal Medicine

## 2010-07-25 ENCOUNTER — Telehealth: Payer: Self-pay | Admitting: *Deleted

## 2010-07-28 ENCOUNTER — Telehealth: Payer: Self-pay | Admitting: *Deleted

## 2010-07-29 ENCOUNTER — Encounter: Payer: Self-pay | Admitting: Internal Medicine

## 2010-08-16 ENCOUNTER — Encounter: Payer: Self-pay | Admitting: Licensed Clinical Social Worker

## 2010-08-19 ENCOUNTER — Encounter: Payer: Self-pay | Admitting: Internal Medicine

## 2010-08-26 ENCOUNTER — Telehealth: Payer: Self-pay | Admitting: Internal Medicine

## 2010-08-26 ENCOUNTER — Ambulatory Visit: Payer: Self-pay | Admitting: Internal Medicine

## 2010-08-26 DIAGNOSIS — IMO0002 Reserved for concepts with insufficient information to code with codable children: Secondary | ICD-10-CM | POA: Insufficient documentation

## 2010-09-02 ENCOUNTER — Telehealth: Payer: Self-pay | Admitting: Internal Medicine

## 2010-10-03 ENCOUNTER — Telehealth: Payer: Self-pay | Admitting: Internal Medicine

## 2010-10-05 ENCOUNTER — Ambulatory Visit: Payer: Self-pay | Admitting: Internal Medicine

## 2010-10-05 DIAGNOSIS — S93419A Sprain of calcaneofibular ligament of unspecified ankle, initial encounter: Secondary | ICD-10-CM | POA: Insufficient documentation

## 2010-10-06 LAB — CONVERTED CEMR LAB: Uric Acid, Serum: 4.2 mg/dL (ref 2.4–7.0)

## 2010-10-13 ENCOUNTER — Telehealth: Payer: Self-pay | Admitting: Internal Medicine

## 2010-10-18 ENCOUNTER — Ambulatory Visit: Payer: Self-pay

## 2010-10-31 ENCOUNTER — Telehealth: Payer: Self-pay | Admitting: Internal Medicine

## 2010-11-01 ENCOUNTER — Ambulatory Visit: Admission: RE | Admit: 2010-11-01 | Discharge: 2010-11-01 | Payer: Self-pay | Source: Home / Self Care

## 2010-11-01 DIAGNOSIS — J069 Acute upper respiratory infection, unspecified: Secondary | ICD-10-CM | POA: Insufficient documentation

## 2010-11-01 DIAGNOSIS — N952 Postmenopausal atrophic vaginitis: Secondary | ICD-10-CM | POA: Insufficient documentation

## 2010-11-01 LAB — CONVERTED CEMR LAB
Bilirubin Urine: NEGATIVE
Blood, UA: NEGATIVE
Ketones, ur: NEGATIVE mg/dL
Leukocytes, UA: NEGATIVE
Nitrite: NEGATIVE
Protein, ur: NEGATIVE mg/dL
Specific Gravity, Urine: 1.011 (ref 1.005–1.03)
Urine Glucose: NEGATIVE mg/dL
Urobilinogen, UA: 1 (ref 0.0–1.0)
pH: 6.5 (ref 5.0–8.0)

## 2010-11-07 ENCOUNTER — Telehealth: Payer: Self-pay | Admitting: Internal Medicine

## 2010-11-17 ENCOUNTER — Other Ambulatory Visit (HOSPITAL_COMMUNITY): Payer: Self-pay | Admitting: Internal Medicine

## 2010-11-17 DIAGNOSIS — Z0000 Encounter for general adult medical examination without abnormal findings: Secondary | ICD-10-CM

## 2010-11-20 ENCOUNTER — Encounter: Payer: Self-pay | Admitting: Thoracic Surgery

## 2010-12-01 ENCOUNTER — Other Ambulatory Visit: Payer: Self-pay | Admitting: Internal Medicine

## 2010-12-01 NOTE — Assessment & Plan Note (Signed)
Summary: est-ck/fu/meds/cfb   Vital Signs:  Patient Profile:   70 Years Old Female Height:     65 inches (165.10 cm) Weight:      229.9 pounds (104.50 kg) BMI:     38.40 Temp:     97.9 degrees F (36.61 degrees C) oral Pulse rate:   75 / minute BP sitting:   121 / 71  (right arm) Cuff size:   large  Pt. in pain?   yes    Location:   left side flank/low back     Intensity:   10    Type:       aching  Vitals Entered By: Krystal Eaton Duncan Dull) (September 29, 2008 2:19 PM)              Is Patient Diabetic? No Nutritional Status BMI of > 30 = obese  Have you ever been in a relationship where you felt threatened, hurt or afraid?No   Does patient need assistance? Functional Status Self care Ambulation Impaired:Risk for fall Comments walker     PCP:  [name] MD  Chief Complaint:  pt c/o being "sick" x , left side flank/low back pain, and med refill.  History of Present Illness: 21yof with pmh of TIA's, mitral valve regurge, HTN, HLD, recurrent boils (MRSA), intertrigo and depression. She is here today with complaint of left flank pain. She has had no dysuria, no vaginal discharge, no change in her urine, specifically no hematuria. She has had some subjective fevers and nausea, no vomiting.  She has felt ill every since she got her flu shot a few  weeks ago. She describes generalized weakness, generalized pain, cough.    She also reports constipation which is chronic and severe.  She has gone from regular BM's to one every few days.   She reports darkened skin over her ankles, which is chronic but getting worse.  She also has a paper from [place] requesting changes in her medications so that they will coninue covering them.      Prior Medications Reviewed Using: Medication Bottles  Updated Prior Medication List: PROTONIX 40 MG TBEC (PANTOPRAZOLE SODIUM) Take 1 tablet by mouth once a day DETROL LA 4 MG CP24 (TOLTERODINE TARTRATE) Take 1 tablet by mouth once at  bedtime LACTULOSE  SOLN (LACTULOSE SOLN) Take 30ml till yo have BM, repeat 4 times in a day. WELLBUTRIN SR 150 MG TB12 (BUPROPION HCL) Take one pill every day for three days, then start taking one pill twice daily. TYLENOL 325 MG TABS (ACETAMINOPHEN) Take 2 tablet by mouth four times a day for pain CADUET 5-80 MG  TABS (AMLODIPINE-ATORVASTATIN) Take 1 tablet by mouth once a day HYDROCHLOROTHIAZIDE 25 MG  TABS (HYDROCHLOROTHIAZIDE) Take 1 tablet by mouth once a day CLOTRIMAZOLE ANTI-FUNGAL 1 %  CREA (CLOTRIMAZOLE) Apply to the area 3-4 times per day DARVON-N 100 MG  TABS (PROPOXYPHENE NAPSYLATE) Take 1 tablet by mouth every four hours as needed for pain PIROXICAM 20 MG  CAPS (PIROXICAM) Take one tablet daily. AMITRIPTYLINE HCL 50 MG TABS (AMITRIPTYLINE HCL) 1 tablet at bed time. ALLOPURINOL 300 MG TABS (ALLOPURINOL) 1 tablet once a day KLOR-CON 10 10 MEQ CR-TABS (POTASSIUM CHLORIDE) 1 tablet twice a day. PLAVIX 75 MG TABS (CLOPIDOGREL BISULFATE) 1 tablet daily. LISINOPRIL 40 MG TABS (LISINOPRIL) 1 tablet once a day. FUROSEMIDE 40 MG TABS (FUROSEMIDE) Take one tablet daily for 4 days for leg swelling. BIOTIN 5000 5 MG CAPS (BIOTIN) Take one tablet daily. APPLE CIDER VINEGAR  300 MG TABS (APPLE CIDER VINEGAR) one daily CALCIUM 500/D 500-200 MG-UNIT TABS (CALCIUM CARBONATE-VITAMIN D) one daily VITAMIN C-ACEROLA 500 MG TABS (ASCORBIC ACID) one daily  Current Allergies (reviewed today): ! SEPTRA ! ASA   Social History:    Retired: Engineer, civil (consulting)    lives alone    quit smoking 1999   Risk Factors: Tobacco use:  quit    Year quit:  1999    Pack-years:  50 Passive smoke exposure:  yes Alcohol use:  no Exercise:  no Seatbelt use:  100 %  Mammogram History:    Date of Last Mammogram:  12/18/2006   Review of Systems       per hpi   Physical Exam  General:     alert and overweight-appearing.   Head:     normocephalic and atraumatic.   Eyes:     pupils equal, pupils round, pupils  reactive to light, and no injection.   Mouth:     pharynx pink and moist and teeth missing.   Lungs:     normal respiratory effort and normal breath sounds.   Heart:     normal rate, regular rhythm, and no murmur.   Abdomen:     soft, non-tender, and normal bowel sounds.  There is L sided CVA tenderness. No skin changes in the area. Pulses:     +2 Extremities:     no edema Neurologic:     alert & oriented X3, cranial nerves II-XII intact, and strength normal in all extremities.   Skin:     turgor normal, color normal, and no rashes.  there is nystatin powder caked in her panis fold. Psych:     Oriented X3, memory intact for recent and remote, and hyperactive.      Impression & Recommendations:  Problem # 1:  LOW BACK PAIN (ICD-724.2) Left flank pain.  Will check UA, CBC for possible UTI.  She is also constipated which MAY cause this pain.  She also might have muscular pain from favoring her chronically painful hip.   The following medications were removed from the medication list:    Tylenol 325 Mg Tabs (Acetaminophen) .[name]... Take 2 tablet by mouth four times a day for pain    Darvon-n 100 Mg Tabs (Propoxyphene napsylate) .[name]... 1 tablet every 4 hour as needed.  Her updated medication list for this problem includes:    Tramadol Hcl 50 Mg Tabs (Tramadol hcl) .[name]... Take one tablet every 8 hrs as needed for pain.    Piroxicam 20 Mg Caps (Piroxicam) .[name]... Take one tablet daily.   Problem # 2:  DEPRESSION (ICD-311) Ms. Mcclusky does not seem depressed today, but she is very talkative and tangential.  She often presents in near hysteria, so today's presentation is improved.  She does seem lonely and anxious.  She is very involved in her church and seems to have good social support. No changes in therapy at this time.   Her updated medication list for this problem includes:    Wellbutrin Sr 150 Mg Tb12 (Bupropion hcl) .[name]... Take one pill every day for three days, then start taking  one pill twice daily.    Amitriptyline Hcl 50 Mg Tabs (Amitriptyline hcl) .[name]... 1 tablet at bed time.   Problem # 3:  GERD (ICD-530.81) Will contine protonix, is covered by [employer].  No current problems with symptoms.  Her updated medication list for this problem includes:    Protonix 40 Mg Tbec (Pantoprazole sodium) .[name]... Take 1  tablet by mouth once a day   Problem # 4:  HYPERLIPIDEMIA (ICD-272.4) [employer] requests change from Caduet to two seperate pills.  Will start lipitor.  Her lipids were last checked in 7/09, and she stills need to be on a stain.  Her updated medication list for this problem includes:    Lipitor 80 Mg Tabs (Atorvastatin calcium) .[name]... Take one tablet daily.  Labs Reviewed: Chol: 205 (04/29/2008)   HDL: 46 (04/29/2008)   LDL: 127 (04/29/2008)   TG: 158 (04/29/2008) SGOT: 18 (06/19/2008)   SGPT: 14 (06/19/2008)  Prior 10 Yr Risk Heart Disease: 15 % (04/29/2008)   Problem # 5:  HIP PAIN, RIGHT, CHRONIC (ICD-719.45) Will change from Darvon to tramadol per [facility] request.  The following medications were removed from the medication list:    Tylenol 325 Mg Tabs (Acetaminophen) .[name]... Take 2 tablet by mouth four times a day for pain    Darvon-n 100 Mg Tabs (Propoxyphene napsylate) .[name]... 1 tablet every 4 hour as needed.  Her updated medication list for this problem includes:    Tramadol Hcl 50 Mg Tabs (Tramadol hcl) .[name]... Take one tablet every 8 hrs as needed for pain.    Piroxicam 20 Mg Caps (Piroxicam) .[name]... Take one tablet daily.   Problem # 6:  Hx of CONSTIPATION (ICD-564.00) She is not using lactulose.  She has been using sorbitol (prescribed years ago).  She has recently gone from having daily BM's do one every 3-4 days.  She has tried miralax in the past and it works.  I will prescribe it again today.  I will also check TSH.  Constipation and gas may be the cause of her flank pain.  The following medications were removed from the medication list:    Lactulose Soln  (Lactulose soln) .[name]... Take 30ml till yo have bm, repeat 4 times in a day.  Orders: T-TSH ([id number])   Problem # 7:  HYPERTENSION (ICD-401.9) Some confusion about what she is actually taking.  From what I can tell she was only given a few days of lasix in the past for lower extremity edema.  I do not think that she needs to be on this strong a diuretic.  For now I will keep her on HCTZ, lisinopril and amlodipine. Will check BMET to be sure K is OK.    The following medications were removed from the medication list:    Hydrochlorothiazide 25 Mg Tabs (Hydrochlorothiazide) .[name]... Take 1 tablet by mouth once a day    Furosemide 40 Mg Tabs (Furosemide) .[name]... Take one tablet daily for 4 days for leg swelling.  Her updated medication list for this problem includes:    Amlodipine Besylate 5 Mg Tabs (Amlodipine besylate) .[name]... Take one tablet daily.    Lisinopril 40 Mg Tabs (Lisinopril) .[name]... 1 tablet once a day.    Hydrochlorothiazide 25 Mg Tabs (Hydrochlorothiazide) .[name]... Take one tablet daily.  Orders: T-Basic Metabolic Panel [id number])   Complete Medication List: 1)  Protonix 40 Mg Tbec (Pantoprazole sodium) .... Take 1 tablet by mouth once a day 2)  Detrol La 4 Mg Cp24 (Tolterodine tartrate) .... Take 1 tablet by mouth once at bedtime 3)  Wellbutrin Sr 150 Mg Tb12 (Bupropion hcl) .... Take one pill every day for three days, then start taking one pill twice daily. 4)  Amlodipine Besylate 5 Mg Tabs (Amlodipine besylate) .... Take one tablet daily. 5)  Clotrimazole Anti-fungal 1 % Crea (Clotrimazole) .... Apply to the area 3-4 times per day  6)  Tramadol Hcl 50 Mg Tabs (Tramadol hcl) .... Take one tablet every 8 hrs as needed for pain. 7)  Piroxicam 20 Mg Caps (Piroxicam) .... Take one tablet daily. 8)  Amitriptyline Hcl 50 Mg Tabs (Amitriptyline hcl) .[name].. 1 tablet at bed time. 9)  Allopurinol 300 Mg Tabs (Allopurinol) .[name].. 1 tablet once a day 10)  Klor-con 10 10 Meq Cr-tabs (Potassium  chloride) .[name].. 1 tablet twice a day. 11)  Plavix 75 Mg Tabs (Clopidogrel bisulfate) .[name].. 1 tablet daily. 12)  Lisinopril 40 Mg Tabs (Lisinopril) .[name].. 1 tablet once a day. 13)  Biotin 5000 5 Mg Caps (Biotin) .... Take one tablet daily. 14)  Apple Cider Vinegar 300 Mg Tabs (Apple cider vinegar) .... One daily 15)  Calcium 500/d 500-200 Mg-unit Tabs (Calcium carbonate-vitamin d) .... One daily 16)  Vitamin C-acerola 500 Mg Tabs (Ascorbic acid) .... One daily 17)  Lipitor 80 Mg Tabs (Atorvastatin calcium) .... Take one tablet daily. 18)  Polyethylene Glycol 1000 Powd (Polyethylene glycol 1000) .... Use 17g in liquid daily for constipation. 19)  Hydrochlorothiazide 25 Mg Tabs (Hydrochlorothiazide) .... Take one tablet daily.  Other Orders: T-Urinalysis Dipstick only ([id number]) T-CBC No Diff ([id number])   Patient Instructions: 1)  You had labs drawn today, I will call you if there is anything abnormal. 2)  You have a new medication list at the bottom of this sheet.  Please review it and throw away old bottles. 3)  Please schedule a follow-up appointment in 3 months. 4)  You should exercise DAILY to lose weight.   Prescriptions: LIPITOR 80 MG TABS (ATORVASTATIN CALCIUM) Take one tablet daily.  #32 x 11   Entered and Authorized by:   Elby Showers MD   Signed by:   Elby Showers MD on 09/29/2008   Method used:   [facility]        [facility] [address] [phone]* (retail)       [address]       [place], [place]  [address]       Ph: [phone] or [phone]       Fax: [phone]   RxID:   [id number] LISINOPRIL 40 MG TABS (LISINOPRIL) 1 tablet once a day.  #32 x 6   Entered and Authorized by:   Elby Showers MD   Signed by:   Elby Showers MD on 09/29/2008   Method used:   [facility]        [facility] [address] [phone]* (retail)       [address]       [place], [place]  [address]       Ph:  [phone] or [phone]       Fax: [phone]   RxID:   [id number] HYDROCHLOROTHIAZIDE 25 MG TABS (HYDROCHLOROTHIAZIDE) Take one tablet daily.  #32 x 6   Entered and Authorized by:   Elby Showers MD   Signed by:   Elby Showers MD on 09/29/2008   Method used:   [facility]        [facility] [address] [phone]* (retail)       [address]       [place], [place]  [address]       Ph: [phone] or [phone]       Fax: [phone]   RxID:   [id number] TRAMADOL HCL  50 MG TABS (TRAMADOL HCL) Take one tablet every 8 hrs as needed for pain.  #90 x 3   Entered and Authorized by:   Elby Showers MD   Signed by:   Elby Showers MD on 09/29/2008   Method used:   [facility]ically to        [facility] [address] [phone]* (retail)       [address]       [place], [place]  [address]       Ph: [phone] or [phone]       Fax: [phone]   RxID:   [id number] AMLODIPINE BESYLATE 5 MG TABS (AMLODIPINE BESYLATE) Take one tablet daily.  #30 x 6   Entered and Authorized by:   Elby Showers MD   Signed by:   Elby Showers MD on 09/29/2008   Method used:   [facility] to        [facility] [address] [phone]* (retail)       [address]       [place], [place]  [address]       Ph: [phone] or [phone]       Fax: [phone]   RxID:   [id number] DETROL LA 4 MG CP24 (TOLTERODINE TARTRATE) Take 1 tablet by mouth once at bedtime  #30 x 11   Entered and Authorized by:   Elby Showers MD   Signed by:   Elby Showers MD on 09/29/2008   Method used:   Electronically to        [facility] [address] [phone]* (retail)       [address]       [place], [place]  [address]       Ph: [phone] or [phone]       Fax: [phone]   RxID:   [id number] PROTONIX 40 MG TBEC (PANTOPRAZOLE SODIUM) Take 1 tablet by mouth once a  day  #30 x 11   Entered and Authorized by:   Elby Showers MD   Signed by:   Elby Showers MD on 09/29/2008   Method used:   Electronically to        [facility] [address] [phone]* (retail)       [address]       [place], [place]  [address]       Ph: [phone] or [phone]       Fax: [phone]   RxID:   [id number] POLYETHYLENE GLYCOL 1000  POWD (POLYETHYLENE GLYCOL 1000) Use 17g in liquid daily for constipation.  #1 mo supply x 11   Entered and Authorized by:   Elby Showers MD   Signed by:   Elby Showers MD on 09/29/2008   Method used:   [facility]ronically to        [facility] [address] [phone]* (retail)       [address]       [place], [place]  [address]       Ph: [phone] or [phone]       Fax: [phone]   RxID:   [id number]  ]
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
## 2010-12-01 NOTE — Consult Note (Signed)
Summary: [facility]: Dr. Duanne Guess Surgery: Dr. Michaell Cowing   Imported By: Florinda Marker 04/23/2007 15:22:31  _____________________________________________________________________  External Attachment:    Type:   Image     Comment:   External Document

## 2010-12-01 NOTE — Miscellaneous (Signed)
Summary: [employer]:  [employer]:   Imported By: Florinda Marker 12/01/2009 14:16:54  _____________________________________________________________________  External Attachment:    Type:   Image     Comment:   External Document

## 2010-12-01 NOTE — Progress Notes (Signed)
Summary: phone/gg  Phone Note Call from Patient   Caller: Patient Summary of Call: Pt called and wants a Rx from you for "  walk in tub"  and a "scooter".  [facility]. Initial call taken by: Merrie Roof RN,  Mar 16, 2009 3:16 PM  Follow-up for Phone Call        I am not sure what a walk-in tub would be (a modification to her home... or a plastic basin???) and I do not know what kind of scooter (does this mean motorized wheelchair or push walker??)  We can refer her to PT for evaluation for appropriate home equiptment.  I will write for equiptment as advised by PT. I have put in the referal order.  Thanks, CPW

## 2010-12-01 NOTE — Assessment & Plan Note (Signed)
Summary: (ACUTE-WALSH)3 WEEK RECHECK/CH   Vital Signs:  Patient Profile:   70 Years Old Female Height:     65 inches (165.10 cm) Weight:      229.01 pounds (104.10 kg) BMI:     38.25 Temp:     100 degrees F (36.89 degrees C) oral Pulse rate:   82 / minute BP sitting:   150 / 86  (right arm)  Pt. in pain?   yes    Location:   all over    Intensity:   10    Type:       aching  Vitals Entered By: Angelina Ok RN (July 09, 2008 3:37 PM)              Is Patient Diabetic? No Nutritional Status BMI of > 30 = obese  Have you ever been in a relationship where you felt threatened, hurt or afraid?No   Does patient need assistance? Functional Status Self care Ambulation Normal     PCP:  [name] MD  Chief Complaint:  Cough, fevers, and raw under arm pit on right side.  History of Present Illness: Pt is a 70 year old woman with extensive PMH as seen in note who presents to clinic with three day history of fevers, chills, dry cough, myalgias, and nasal congestion.  Pt reports that her great grandson has had a cold and some of the people in her church also have some respiratory symptoms.  Pt is also here to follow up the results of her 2D echo which was normal.  Pt also reports some emesis  x 3 or 4 which was secondary to her coughing.   Pt denies abdominal pain, constipation, diarrhea, weight loss, dizziness when standing, chest pain, lower extremity pain.  She does report she has a decreased appetite.      Prior Medications Reviewed Using: Patient Recall  Prior Medication List:  PROTONIX 40 MG TBEC (PANTOPRAZOLE SODIUM) Take 1 tablet by mouth once a day DETROL LA 4 MG CP24 (TOLTERODINE TARTRATE) Take 1 tablet by mouth once at bedtime LACTULOSE  SOLN (LACTULOSE SOLN) Take 30ml till yo have BM, repeat 4 times in a day. WELLBUTRIN SR 150 MG TB12 (BUPROPION HCL) Take one pill every day for three days, then start taking one pill twice daily. TYLENOL 325 MG TABS  (ACETAMINOPHEN) Take 2 tablet by mouth four times a day for pain CADUET 5-80 MG  TABS (AMLODIPINE-ATORVASTATIN) Take 1 tablet by mouth once a day HYDROCHLOROTHIAZIDE 25 MG  TABS (HYDROCHLOROTHIAZIDE) Take 1 tablet by mouth once a day NYAMYC 100000 UNIT/GM  POWD (NYSTATIN) Apply to dry skin twice a day CLOTRIMAZOLE ANTI-FUNGAL 1 %  CREA (CLOTRIMAZOLE) Apply to the area 3-4 times per day DARVON-N 100 MG  TABS (PROPOXYPHENE NAPSYLATE) Take 1 tablet by mouth every four hours as needed for pain DIFLUCAN 100 MG  TABS (FLUCONAZOLE) Take 1 tablet by mouth once a day PIROXICAM 20 MG  CAPS (PIROXICAM) Take one tablet daily. AMITRIPTYLINE HCL 50 MG TABS (AMITRIPTYLINE HCL) 1 tablet at bed time. ALLOPURINOL 300 MG TABS (ALLOPURINOL) 1 tablet once a day KLOR-CON 10 10 MEQ CR-TABS (POTASSIUM CHLORIDE) 1 tablet twice a day. PLAVIX 75 MG TABS (CLOPIDOGREL BISULFATE) 1 tablet daily. LISINOPRIL 40 MG TABS (LISINOPRIL) 1 tablet once a day. DARVON-N 100 MG TABS (PROPOXYPHENE NAPSYLATE) 1 tablet every 4 hour as needed. FUROSEMIDE 40 MG TABS (FUROSEMIDE) Take one tablet daily for 4 days for leg swelling.   Current Allergies (reviewed today): !  SEPTRA ! ASA    Risk Factors: Tobacco use:  quit    Year quit:  1999    Pack-years:  50 Passive smoke exposure:  yes Alcohol use:  no Exercise:  no Seatbelt use:  100 %  Mammogram History:    Date of Last Mammogram:  12/18/2006   Review of Systems       SEE HPI   Physical Exam  General:     alert, well-developed, well-nourished, well-hydrated, and overweight-appearing.   Head:     normocephalic and atraumatic.   Eyes:     No corneal or conjunctival inflammation noted. EOMI. Perrla. Funduscopic exam benign, without hemorrhages, exudates or papilledema. Vision grossly normal. Mouth:     pharynx pink and moist.  Mucous membranes somewhat dry.  No erythema.  Positive dentures. Neck:     supple, no JVD, no cervical lymphadenopathy, and no neck  tenderness.   Chest Wall:     no tenderness.   Lungs:     normal respiratory effort, no intercostal retractions, no accessory muscle use, normal breath sounds, no dullness, no crackles, and no wheezes.   Heart:     normal rate, regular rhythm, no murmur, no gallop, no rub, and no JVD.   Abdomen:     soft, non-tender, and normal bowel sounds.   Extremities:     No clubbing, cyanosis, edema, or deformity noted with normal full range of motion of all joints.   Neurologic:     alert & oriented X3.   Skin:     color normal, no rashes, and no petechiae.   Cervical Nodes:     no anterior cervical adenopathy and no posterior cervical adenopathy.   Psych:     Oriented X3, normally interactive, good eye contact, not anxious appearing, not depressed appearing, and not agitated.      Impression & Recommendations:  Problem # 1:  URI (ICD-465.9) Pt does not appear too ill.  She likely has a viral URI.  I will check a CXR, CBC with diff, and BMET.  She is to take guafenesin as needed for cough, start with clear liquid diet , and to take it easy for a few days.  I will call her if she has a PNA.  She has been instructed to come back to clinic if her symptoms get worse (high fevers, unable to keep food down, productive cough, etc...).  Pt to follow up in 5-7 days if needed.   Her updated medication list for this problem includes:    Tylenol 325 Mg Tabs (Acetaminophen) .[name]... Take 2 tablet by mouth four times a day for pain    Piroxicam 20 Mg Caps (Piroxicam) .[name]... Take one tablet daily.    Guaifenesin 200 Mg Tabs (Guaifenesin) .[name]... Take one pill every 4 hours as needed for coughing  Orders: T-Basic Metabolic Panel [id number]) T-CBC w/Diff [id number]) CXR- 2view (CXR)   Complete Medication List: 1)  Protonix 40 Mg Tbec (Pantoprazole sodium) .... Take 1 tablet by mouth once a day 2)  Detrol La 4 Mg Cp24 (Tolterodine tartrate) .... Take 1 tablet by mouth once at bedtime 3)  Lactulose Soln  (Lactulose soln) .... Take 30ml till yo have bm, repeat 4 times in a day. 4)  Wellbutrin Sr 150 Mg Tb12 (Bupropion hcl) .... Take one pill every day for three days, then start taking one pill twice daily. 5)  Tylenol 325 Mg Tabs (Acetaminophen) .... Take 2 tablet by mouth four times a day for pain  6)  Caduet 5-80 Mg Tabs (Amlodipine-atorvastatin) .... Take 1 tablet by mouth once a day 7)  Hydrochlorothiazide 25 Mg Tabs (Hydrochlorothiazide) .... Take 1 tablet by mouth once a day 8)  Nyamyc 100000 Unit/gm Powd (Nystatin) .... Apply to dry skin twice a day 9)  Clotrimazole Anti-fungal 1 % Crea (Clotrimazole) .... Apply to the area 3-4 times per day 10)  Darvon-n 100 Mg Tabs (Propoxyphene napsylate) .... Take 1 tablet by mouth every four hours as needed for pain 11)  Diflucan 100 Mg Tabs (Fluconazole) .... Take 1 tablet by mouth once a day 12)  Piroxicam 20 Mg Caps (Piroxicam) .... Take one tablet daily. 13)  Amitriptyline Hcl 50 Mg Tabs (Amitriptyline hcl) .[name].. 1 tablet at bed time. 14)  Allopurinol 300 Mg Tabs (Allopurinol) .[name].. 1 tablet once a day 15)  Klor-con 10 10 Meq Cr-tabs (Potassium chloride) .[name].. 1 tablet twice a day. 16)  Plavix 75 Mg Tabs (Clopidogrel bisulfate) .[name].. 1 tablet daily. 17)  Lisinopril 40 Mg Tabs (Lisinopril) .[name].. 1 tablet once a day. 18)  Darvon-n 100 Mg Tabs (Propoxyphene napsylate) .[name].. 1 tablet every 4 hour as needed. 19)  Furosemide 40 Mg Tabs (Furosemide) .... Take one tablet daily for 4 days for leg swelling. 20)  Guaifenesin 200 Mg Tabs (Guaifenesin) .... Take one pill every 4 hours as needed for coughing   Patient Instructions: 1)  Please schedule an appointment in 5-7 days to see how you are doing.  If you are feeling better you can call and cancel the appointment.  If you start feeling worse, start coughing up anything, have high fevers, increased shortness of breath, or can not keep any food or liquids down then call the clinic or go to the ER.  I would take it  easy for the next few days because you have an Upper respiratory infection.  I would try to have clear liquid diet at first.     Prescriptions: GUAIFENESIN 200 MG TABS (GUAIFENESIN) Take one pill every 4 hours as needed for coughing  #2 weeks x 1   Entered and Authorized by:   Rufina Falco MD   Signed by:   Rufina Falco MD on 07/09/2008   Method used:   Electronically to        [facility] [facility] [address] [phone]* (retail)       [address]       [place], [place]  [address]       Ph: [phone] or [phone]       Fax: [phone]   RxID:   [id number]  ]
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
## 2010-12-01 NOTE — Progress Notes (Signed)
Summary: phone/gg  Phone Note Call from Patient   Caller: Patient Summary of Call: Pt called and states she fell today at home. Denies LOC,  denies hitting head, fell on rt side.  c/o elbow and rt side pain. She took pain med. feels better now. SHe is ambulating okay.  Her son is with her now. She will call tomorrow if she has any problem Initial call taken by: Merrie Roof RN,  December 07, 2008 5:25 PM  Follow-up for Phone Call        So noted. Follow-up by: Chauncey Reading DO,  December 07, 2008 5:48 PM

## 2010-12-01 NOTE — Assessment & Plan Note (Signed)
Summary: Soc. Work  60 minutes.  Soc. Work.  Assist with SCAT application and community resource referral.

## 2010-12-01 NOTE — Miscellaneous (Signed)
Summary: [facility]  Medical Surgical Procedures   Imported By: Florinda Marker 05/13/2008 14:45:22  _____________________________________________________________________  External Attachment:    Type:   Image     Comment:   External Document

## 2010-12-01 NOTE — Miscellaneous (Signed)
Summary: [facility]  [facility]   Imported By: Shon Hough 08/05/2010 11:41:16  _____________________________________________________________________  External Attachment:    Type:   Image     Comment:   External Document
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
## 2010-12-01 NOTE — Progress Notes (Signed)
Summary: Medication change  Phone Note From Pharmacy   Caller: [facility] [address] [phone]* Summary of Call: Pt on [place] can pharmacy switch to Potassium Chloride that is a smaller. [facility]   [phone] Initial call taken by: Angelina Ok RN,  September 10, 2009 10:29 AM    New/Updated Medications: POTASSIUM CHLORIDE CR 10 MEQ CR-CAPS (POTASSIUM CHLORIDE) Take 1 tablet by mouth two times a day Prescriptions: POTASSIUM CHLORIDE CR 10 MEQ CR-CAPS (POTASSIUM CHLORIDE) Take 1 tablet by mouth two times a day  #62 x 6   Entered and Authorized by:   Acey Lav MD   Signed by:   Paulette Blanch Dam MD on 09/14/2009   Method used:   [facility]        [facility] [facility] [address] [phone]* (retail)       [address]       [place], [place]  [address]       Ph: [phone] or [phone]       Fax: [phone]   RxID:   [id number]

## 2010-12-01 NOTE — Miscellaneous (Signed)
Summary: vaccine record  vaccine record   Imported By: Harrison Mons Steps 11/06/2006 12:02:50  _____________________________________________________________________  External Attachment:    Type:   Image     Comment:   External Document

## 2010-12-01 NOTE — Assessment & Plan Note (Signed)
Summary: ?URI, ?vag yeast, rash over body/pcp-pribula/hla   Vital Signs:  Patient profile:   70 year old female Height:      65 inches (165.10 cm) Weight:      237.4 pounds (107.91 kg) BMI:     39.65 Temp:     97.7 degrees F (36.50 degrees C) oral Pulse rate:   65 / minute BP sitting:   149 / 82  (left arm)  Vitals Entered By: Stanton Kidney Ditzler RN (November 01, 2010 1:26 PM) Is Patient Diabetic? No Pain Assessment Patient in pain? yes     Location: legs and knees Intensity: 10 Type: throbbing Onset of pain  long time Nutritional Status BMI of > 30 = obese Nutritional Status Detail appetite good  Have you ever been in a relationship where you felt threatened, hurt or afraid?denies   Does patient need assistance? Functional Status Self care Ambulation Impaired:Risk for fall Comments Uses a walker. Has gray - green productive cough and nasal congestion. Vag itching.   Primary Care Provider:  Leodis Sias MD   History of Present Illness: 70yo W with obesity, HTN, HL presents for evaluation of the following complaints: 1. cough. For the past 2-3 weeks, she has had a cough productive of green-gray sputum as well as some nasal congestion. She denies fever/chills, shortness of breath, chest pain, or sick contacts. She feels that the sputum is becoming less and that the cough overall may be starting to get better. She has felt generally a bit run down for the past few weeks but no focal weakness.  2. increased urinary frequency/urgency. For the past few days, she feels that she has had to go to the bathroom more frequently with somewhat greater urgency. She denies dysuria, abdominal pain, fever/chills, or blood in urine.  3. vaginal itching. For the past 2-3 days, she has noticed itching in her vagina but no discharge or odor. She uses Premarin cream.    Depression History:      The patient denies a depressed mood most of the day and a diminished interest in her usual daily  activities.         Preventive Screening-Counseling & Management  Alcohol-Tobacco     Alcohol drinks/day: 0     Smoking Status: quit     Year Quit: 1999     Pack years: 50     Passive Smoke Exposure: yes  Caffeine-Diet-Exercise     Does Patient Exercise: no  Current Medications (verified): 1)  Protonix 40 Mg Pack (Pantoprazole Sodium) .... Take One Tablet Daily. 2)  Oxybutynin Chloride 5 Mg Xr24h-Tab (Oxybutynin Chloride) .... Take One Tablet At Bedtime. 3)  Amlodipine Besylate 5 Mg Tabs (Amlodipine Besylate) .... Take One Tablet Daily. 4)  Clotrimazole Anti-Fungal 1 %  Crea (Clotrimazole) .... Apply To The Area 3-4 Times Per Day 5)  Allopurinol 300 Mg Tabs (Allopurinol) .[name].. 1 Tablet Once A Day 6)  Plavix 75 Mg Tabs (Clopidogrel Bisulfate) .[name].. 1 Tablet Daily. 7)  Lisinopril 40 Mg Tabs (Lisinopril) .[name].. 1 Tablet Once A Day. 8)  Biotin 5000 5 Mg Caps (Biotin) .... Take One Tablet Daily. 9)  Apple Cider Vinegar 300 Mg Tabs (Apple Cider Vinegar) .... One Daily 10)  Calcium 500/d 500-200 Mg-Unit Tabs (Calcium Carbonate-Vitamin D) .... One Daily 11)  Vitamin C-Acerola 500 Mg Tabs (Ascorbic Acid) .... One Daily 12)  Lipitor 80 Mg Tabs (Atorvastatin Calcium) .... Take One Tablet Daily. 13)  Polyethylene Glycol 1000  Powd (Polyethylene Glycol 1000) .Marland KitchenMarland KitchenMarland Kitchen  Use 17g in Liquid Daily For Constipation. 14)  Furosemide 20 Mg Tabs (Furosemide) .... Take 1 Tablet By Mouth Once A Day For Swelling 15)  Fluocinonide 0.05 % Crea (Fluocinonide) .... Apply To Affected Areas Two Times A Day. 16)  Premarin 0.625 Mg/gm Crea (Estrogens, Conjugated) .... Apply Intravaginally and Externally As Directed 17)  Cetirizine Hcl 10 Mg Tabs (Cetirizine Hcl) .... Take 1 Tablet By Mouth Once A Day For Itching 18)  Nystop 100000 Unit/gm Powd (Nystatin) .... Use As Directed. 19)  Relief Medical Leg Knee High  Misc (Elastic Bandages & Supports) .... Ted Compression Support Hose 20)  Triamcinolone Acetonide 0.1 % Oint  (Triamcinolone Acetonide) .... Apply To Affected Area Two Times A Day 21)  Potassium Chloride Cr 10 Meq Cr-Tabs (Potassium Chloride) .... Take 1 Tablet By Mouth Once A Day 22)  Tramadol Hcl 50 Mg Tabs (Tramadol Hcl) .... Take 1 Tablet By Mouth Every 8 Hours For Pain 23)  Dermoplast 20-0.5 % Aero (Benzocaine-Menthol) .... Apply To Back As Needed For Itching and Skin Irritation  Allergies: 1)  ! Septra 2)  ! Jonne Ply  Past History:  Past Medical History: Last updated: 02/14/2008 Cervical cancer, hx of (ovarian cancer 1972 oophorectomy) GERD Gout Hypertension Low back pain,  Sec to DDD and DJD Osteoarthritis - both hips Transient ischemic attack, hx of Sensorineural hearing loss ( bilateral w/chronic L ear pain) CPAP Mitral valve prolapse Chronic venous insufficiency Diverticular Disease Hyperlipidemia Boils Gout  Family History: Last updated: 08/26/2010 Mom: Diabetes, bilateral BKA.  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 Eyes:  Denies blurring. ENT:  Complains of nasal congestion; denies sore throat. CV:  Denies chest pain or discomfort and lightheadness. Resp:  Complains of cough and sputum productive; denies coughing up blood. GI:  Denies abdominal pain and change in bowel habits. GU:  Complains of urinary frequency; denies discharge and dysuria. Derm:  Complains of rash; rash under breasts and in folds of skin near groin that she treats with clotrimazole cream. Neuro:  Denies weakness.  Physical Exam  General:  alert, cooperative to examination, and overweight-appearing.   Head:  normocephalic and atraumatic.   Eyes:  vision grossly intact, pupils equal, pupils round, and pupils reactive to light.   Mouth:  pharynx pink and moist.   Neck:  supple and no masses.   Lungs:  normal respiratory  effort, normal breath sounds, no crackles, and no wheezes.   Heart:  normal rate, regular rhythm, no murmur, no gallop, and no rub.   Abdomen:  soft, non-tender, normal bowel sounds, and no distention.   Genitalia:  no external lesions and no vaginal discharge.   Extremities:  No cyanosis or edema.  Neurologic:  alert & oriented X3.   Skin:  Reseolving/mild intertrigo under breasts and in folds of skin near groin.  Psych:  Oriented X3, memory intact for recent and remote, normally interactive, good eye contact, not anxious appearing, and not depressed appearing.     Impression & Recommendations:  Problem # 1:  UPPER RESPIRATORY INFECTION, VIRAL (ICD-465.9) Assessment New Patient afebrile with no acute findings on exam. Suspect that cough is secondary to viral upper respiratory tract infection, which (per patient history) seems to be improving. She received the flu vaccine and Pneumovax earlier this year. Offered to prescribe cough suppressants but patient felt  she did not need them. She was advised to drink plenty of fluids and get plenty of rest. If she develops chest pain or fever or if symptoms do not improve, she should call the clinic.   Problem # 2:  DYSURIA (ICD-788.1) Given symptoms of increased urinary frequency +?urgency, will check UA and urine culture. If positive, will call in an appropriate antibiotic prescription for the patient.   Her updated medication list for this problem includes:    Oxybutynin Chloride 5 Mg Xr24h-tab (Oxybutynin chloride) .[name]... Take one tablet at bedtime.  Orders: T-Culture, Urine ([id number]) T-Urinalysis ([id number])  Problem # 3:  INTERTRIGO, CANDIDAL (ICD-695.89) Seems to be improving. Patient encouraged to continue using clotrimazole cream and to keep the area clean and dry.   Problem # 4:  VAGINAL PRURITUS (ICD-698.1) Patient deferred full pelvic exam today but exterior exam shows no discharge or evidence of active candidal infection.  Patient advised to call if itching continues and to use clotrimazole (as above) for fungal infection.   Complete Medication List: 1)  Protonix 40 Mg Pack (Pantoprazole sodium) .... Take one tablet daily. 2)  Oxybutynin Chloride 5 Mg Xr24h-tab (Oxybutynin chloride) .... Take one tablet at bedtime. 3)  Amlodipine Besylate 5 Mg Tabs (Amlodipine besylate) .... Take one tablet daily. 4)  Clotrimazole Anti-fungal 1 % Crea (Clotrimazole) .... Apply to the area 3-4 times per day 5)  Allopurinol 300 Mg Tabs (Allopurinol) .[name].. 1 tablet once a day 6)  Plavix 75 Mg Tabs (Clopidogrel bisulfate) .[name].. 1 tablet daily. 7)  Lisinopril 40 Mg Tabs (Lisinopril) .[name].. 1 tablet once a day. 8)  Biotin 5000 5 Mg Caps (Biotin) .... Take one tablet daily. 9)  Apple Cider Vinegar 300 Mg Tabs (Apple cider vinegar) .... One daily 10)  Calcium 500/d 500-200 Mg-unit Tabs (Calcium carbonate-vitamin d) .... One daily 11)  Vitamin C-acerola 500 Mg Tabs (Ascorbic acid) .... One daily 12)  Lipitor 80 Mg Tabs (Atorvastatin calcium) .... Take one tablet daily. 13)  Polyethylene Glycol 1000 Powd (Polyethylene glycol 1000) .... Use 17g in liquid daily for constipation. 14)  Furosemide 20 Mg Tabs (Furosemide) .... Take 1 tablet by mouth once a day for swelling 15)  Fluocinonide 0.05 % Crea (Fluocinonide) .... Apply to affected areas two times a day. 16)  Premarin 0.625 Mg/gm Crea (Estrogens, conjugated) .... Apply intravaginally and externally as directed 17)  Cetirizine Hcl 10 Mg Tabs (Cetirizine hcl) .... Take 1 tablet by mouth once a day for itching 18)  Nystop 100000 Unit/gm Powd (Nystatin) .... Use as directed. 19)  Relief Medical Leg Knee High Misc (Elastic bandages & supports) .... Ted compression support hose 20)  Triamcinolone Acetonide 0.1 % Oint (Triamcinolone acetonide) .... Apply to affected area two times a day 21)  Potassium Chloride Cr 10 Meq Cr-tabs (Potassium chloride) .... Take 1 tablet by mouth once a day 22)   Tramadol Hcl 50 Mg Tabs (Tramadol hcl) .... Take 1 tablet by mouth every 8 hours for pain 23)  Dermoplast 20-0.5 % Aero (Benzocaine-menthol) .... Apply to back as needed for itching and skin irritation  Patient Instructions: 1)  We are checking your urine for signs of infection. If we find evidence of bacteria, I will call you with a prescription for an antibiotic. 2)  I believe that you have a viral upper respiratory tract infection (common cold). I recommend rest, drinking water, and eating good food and it should get better with time. If your cough should become worse, you develop chest  pain or difficulty breathing, please call our office right away.  3)  Please schedule a follow-up appointment in 1 month.   Orders Added: 1)  T-Culture, Urine [[id number]] 2)  T-Urinalysis [[id number]] 3)  Est. Patient Level IV [69629]   Process Orders Check Orders Results:     [facility]: Check successful Tests Sent for requisitioning (November 03, 2010 3:03 PM):     11/01/2010: [facility] -- T-Culture, Urine [[id number]] (signed)     11/01/2010: [facility] -- T-Urinalysis [[id number]] (signed)     Prevention & Chronic Care Immunizations   Influenza vaccine: Fluvax MCR  (07/20/2010)   Influenza vaccine deferral: Not available  (05/06/2010)   Influenza vaccine due: 08/08/2008    Tetanus booster: 05/07/2009: Td   Td booster deferral: Not indicated  (05/06/2010)    Pneumococcal vaccine: Pneumovax  (08/26/2010)   Pneumococcal vaccine deferral: Deferred  (12/09/2009)    H. zoster vaccine: Not documented   H. zoster vaccine deferral: Deferred  (12/09/2009)  Colorectal Screening   Hemoccult: Not documented   Hemoccult action/deferral: Ordered  (12/09/2009)    Colonoscopy: Not documented   Colonoscopy action/deferral: Deferred  (12/09/2009)  Other Screening   Pap smear: Not documented   Pap smear action/deferral: Not indicated S/P  hysterectomy  (05/14/2009)    Mammogram: ASSESSMENT: Negative - BI-RADS 1^MM DIGITAL SCREENING  (12/01/2009)   Mammogram due: 12/22/2010    DXA bone density scan: Not documented   DXA bone density action/deferral: Deferred  (12/09/2009)   Smoking status: quit  (11/01/2010)  Lipids   Total Cholesterol: 199  (02/08/2010)   Lipid panel action/deferral: Lipid Panel ordered   LDL: 116  (02/08/2010)   LDL Direct: Not documented   HDL: 48  (02/08/2010)   Triglycerides: 175  (02/08/2010)    SGOT (AST): 21  (02/08/2010)   BMP action: Ordered   SGPT (ALT): 21  (02/08/2010)   Alkaline phosphatase: 93  (02/08/2010)   Total bilirubin: 0.5  (02/08/2010)    Lipid flowsheet reviewed?: Yes   Progress toward LDL goal: Unchanged  Hypertension   Last Blood Pressure: 149 / 82  (11/01/2010)   Serum creatinine: 1.46  (05/26/2010)   Serum potassium 4.3  (05/26/2010)    Hypertension flowsheet reviewed?: Yes   Progress toward BP goal: Unchanged  Self-Management Support :   Personal Goals (by the next clinic visit) :      Personal blood pressure goal: 140/90  (12/09/2009)     Personal LDL goal: 130  (12/09/2009)    Patient will work on the following items until the next clinic visit to reach self-care goals:     Medications and monitoring: take my medicines every day, check my blood pressure, bring all of my medications to every visit, weigh myself weekly  (11/01/2010)     Eating: eat more vegetables, use fresh or frozen vegetables, eat foods that are low in salt, eat baked foods instead of fried foods, eat fruit for snacks and desserts, limit or avoid alcohol  (11/01/2010)     Activity: take a 30 minute walk every day  (11/01/2010)    Hypertension self-management support: Written self-care plan, Education handout, Resources for patients handout  (11/01/2010)   Hypertension self-care plan printed.   Hypertension education handout printed    Lipid self-management support: Written self-care  plan, Education handout, Resources for patients handout  (11/01/2010)   Lipid self-care plan printed.   Lipid education handout printed      Resource handout printed.  Process  Orders Check Orders Results:     [facility]: Check successful Tests Sent for requisitioning (November 03, 2010 3:03 PM):     11/01/2010: [facility] -- T-Culture, Urine [[id number]] (signed)     11/01/2010: [facility] -- T-Urinalysis [[id number]] (signed)     Appended Document: lab results/UTI/antibx Rx Called patient to discuss lab results. Initial UA showed no evidence of UTI. However, urine culture showed 55,000 colonies of E. coli. Patient says that she has been feeling tired, run down. Have called in prescription for antibiotic to [facility] -- they have assured me that patient will receive it today. She is advised to take the antibiotics and call the clinic if she does not feel improved over the next week or so.   Prescriptions: CIPROFLOXACIN HCL 250 MG TABS (CIPROFLOXACIN HCL) Take 1 tablet by mouth two times a day for 3 days  #6 x 0   Entered and Authorized by:   Whitney Post MD   Signed by:   Whitney Post MD on 11/04/2010   Method used:   Telephoned to ...       [facility] (retail)       [address]       [place], [place]  [address]       Ph: [phone]       Fax: [phone]   RxID:   [id number]

## 2010-12-01 NOTE — Letter (Signed)
[facility]   Imported By: Margie Billet 03/08/2010 14:36:16  _____________________________________________________________________  External Attachment:    Type:   Image     Comment:   External Document

## 2010-12-01 NOTE — Progress Notes (Signed)
Summary: appt/ hla  Phone Note Call from Patient   Summary of Call: pt called at 1511 and cancelled appt Initial call taken by: Marin Roberts RN,  May 23, 2010 5:08 PM

## 2010-12-01 NOTE — Progress Notes (Signed)
Summary: SW  Phone Note Outgoing Call   Summary of Call: Left mess  8/22 Lengthy conversation w patient regarding need for transfer bench for whirlpool.  Apparently she has some type of wood going across a bench to help her get in and out. Agreed to have me call [facility] for home safety/transfer bench.  Patient also discussed the extent of her heart issues and would like to discuss scooter with physician at next visit.   SW f/u.  Follow-up for Phone Call        Left another message for [facility].  Dorothe Pea  June 20, 2010 9:48 AM

## 2010-12-01 NOTE — Progress Notes (Signed)
Summary: phone/gg  Phone Note Call from Patient   Caller: Patient Summary of Call: Pt called with c/o past 2 days she has increase thirst, can't pass water or have bowel movement.  Also her feet are swelling. Pt denies any SOB. she is able to void but not going alot.  She has no other complaints but is concerned about thirst. Can I put her in for appointment tomorrow? Initial call taken by: Merrie Roof RN,  May 05, 2010 4:59 PM  Follow-up for Phone Call        yes. Follow-up by: Zoila Shutter MD,  May 05, 2010 5:20 PM

## 2010-12-01 NOTE — Progress Notes (Signed)
Summary: walker, hip/ hla  Phone Note Call from Patient   Caller: Patient Summary of Call: pt calls to say she needs to get a new walker, she has broken her old one in the driveway, could she get a new walker, one with a seat and brakes and a basket. she wants [facility] to take care of this.  she also continues to c/o her hip problems, R hip is popping. Initial call taken by: Marin Roberts RN,  February 11, 2010 5:01 PM  Follow-up for Phone Call        I have put in the order for a referal to [facility] for the walker.  She will need to get in touch with her orthopedist about her hip popping and pain.  They have been discussing this issue since her hip replacement.  Thank you.     Appended Document: walker, hip/ hla Order for walker faxed to [facility], Pt informed she needs to call orthopedic MD for hip pain Patient/caller verbalizes understanding of these instructions.

## 2010-12-01 NOTE — Assessment & Plan Note (Signed)
Summary: allergies/gg   Vital Signs:  Patient profile:   70 year old female Height:      65 inches (165.10 cm) Weight:      240.3 pounds (109.23 kg) BMI:     40.13 O2 Sat:      99 % on Room air Temp:     98.0 degrees F (36.67 degrees C) oral Pulse rate:   63 / minute BP sitting:   128 / 68  (right arm)  Vitals Entered By: Stanton Kidney Ditzler RN (Mar 11, 2010 10:55 AM)  O2 Flow:  Room air Is Patient Diabetic? No Pain Assessment Patient in pain? yes     Location: rectum Intensity: 10 Type: throbbing Onset of pain  ? bleeding Nutritional Status BMI of 25 - 29 = overweight Nutritional Status Detail appetite down  Have you ever been in a relationship where you felt threatened, hurt or afraid?denies   Does patient need assistance? Functional Status Self care Ambulation Impaired:Risk for fall Comments Has a walker. Leaking urine - needs pads. ? hemorrhoids - bleeding. Wheezing and hard of hearing past month. Wants chair lift.   Primary Care Provider:  Elby Showers MD   History of Present Illness: She is having a lotof  allergy, she is having shorthness of breath. She is having shortness of breath all the time. She can not laying  flat because of her hip problems and dyspnea. Her dyspnea is worse when she walk. She denies chest pain when she walk. Her dyspnea is worse for months. SHe feels that her feet are swelling since last 3 weeks. She is also have some dry cough.  She denies runny nose, no sneezing. When she has bowel movement and she clean her self she has blood in the tissue paper since 2 days ago. Small amount of blood.   Patient had the following request: 1 -Pad for urinary incontinence.                                                        2- home health.                                                        3- bedside comode.                                                         4- Lift chair.  I expend more than half hour for this appointment.   Depression  History:      The patient denies a depressed mood most of the day and a diminished interest in her usual daily activities.         Preventive Screening-Counseling & Management  Alcohol-Tobacco     Alcohol drinks/day: 0     Smoking Status: quit     Year Quit: 1999     Pack years: 50     Passive Smoke Exposure: yes  Caffeine-Diet-Exercise     Does Patient Exercise: no  Current  Medications (verified): 1)  Protonix 40 Mg Pack (Pantoprazole Sodium) .... Take One Tablet Daily. 2)  Oxybutynin Chloride 5 Mg Xr24h-Tab (Oxybutynin Chloride) .... Take One Tablet At Bedtime. 3)  Amlodipine Besylate 5 Mg Tabs (Amlodipine Besylate) .... Take One Tablet Daily. 4)  Clotrimazole Anti-Fungal 1 %  Crea (Clotrimazole) .... Apply To The Area 3-4 Times Per Day 5)  Allopurinol 300 Mg Tabs (Allopurinol) .[name].. 1 Tablet Once A Day 6)  Plavix 75 Mg Tabs (Clopidogrel Bisulfate) .[name].. 1 Tablet Daily. 7)  Lisinopril 40 Mg Tabs (Lisinopril) .[name].. 1 Tablet Once A Day. 8)  Biotin 5000 5 Mg Caps (Biotin) .... Take One Tablet Daily. 9)  Apple Cider Vinegar 300 Mg Tabs (Apple Cider Vinegar) .... One Daily 10)  Calcium 500/d 500-200 Mg-Unit Tabs (Calcium Carbonate-Vitamin D) .... One Daily 11)  Vitamin C-Acerola 500 Mg Tabs (Ascorbic Acid) .... One Daily 12)  Lipitor 80 Mg Tabs (Atorvastatin Calcium) .... Take One Tablet Daily. 13)  Polyethylene Glycol 1000  Powd (Polyethylene Glycol 1000) .... Use 17g in Liquid Daily For Constipation. 14)  Hydrochlorothiazide 25 Mg Tabs (Hydrochlorothiazide) .... Take One Tablet Daily. 15)  Fluocinonide 0.05 % Crea (Fluocinonide) .... Apply To Affected Areas Two Times A Day. 16)  Premarin 0.625 Mg/gm Crea (Estrogens, Conjugated) .... Apply Intravaginally and Externally As Directed 17)  Loratadine 10 Mg Tabs (Loratadine) .... Take One Tablet Daily For Sinuses.  Allergies: 1)  ! Septra 2)  ! Asa  Review of Systems  The patient denies fever, chest pain, syncope, hemoptysis, abdominal  pain, melena, and severe indigestion/heartburn.    Physical Exam  General:  alert and well-developed.   Lungs:  normal respiratory effort, no intercostal retractions, and no accessory muscle use.  mild crackles.  Heart:  normal rate and regular rhythm.   Abdomen:  soft, non-tender, and normal bowel sounds.   Extremities:  2+ left pedal edema.     Impression & Recommendations:  Problem # 1:  DYSPNEA ON EXERTION (ICD-786.09) She has some sign of fluid overload, lower extremity edema, crackles and orthopnea. I will change HCTZ  for lasix. I will get 2 DECHO. I will see her in 1 week to check bmet and dyspnea, will consider increase lasix dose. She might need cardiology evaluation if she doesnt improved with lasix.  Her updated medication list for this problem includes:    Lisinopril 40 Mg Tabs (Lisinopril) .[name]... 1 tablet once a day.    Furosemide 20 Mg Tabs (Furosemide) .[name]... Take 1 tablet by mouth daily.  Orders: 2 D Echo (2 D Echo)  Problem # 2:  HEMORRHOIDS (ICD-455.6) I will precribe hydrocortisone supp.   Problem # 3:  ALLERGIC RHINITIS (ICD-477.9) Continue with loratadine.  Her updated medication list for this problem includes:    Loratadine 10 Mg Tabs (Loratadine) .[name]... Take one tablet daily for sinuses.  Complete Medication List: 1)  Protonix 40 Mg Pack (Pantoprazole sodium) .... Take one tablet daily. 2)  Oxybutynin Chloride 5 Mg Xr24h-tab (Oxybutynin chloride) .... Take one tablet at bedtime. 3)  Amlodipine Besylate 5 Mg Tabs (Amlodipine besylate) .... Take one tablet daily. 4)  Clotrimazole Anti-fungal 1 % Crea (Clotrimazole) .... Apply to the area 3-4 times per day 5)  Allopurinol 300 Mg Tabs (Allopurinol) .[name].. 1 tablet once a day 6)  Plavix 75 Mg Tabs (Clopidogrel bisulfate) .[name].. 1 tablet daily. 7)  Lisinopril 40 Mg Tabs (Lisinopril) .[name].. 1 tablet once a day. 8)  Biotin 5000 5 Mg Caps (Biotin) .... Take one  tablet daily. 9)  Apple Cider Vinegar 300 Mg Tabs (Apple  cider vinegar) .... One daily 10)  Calcium 500/d 500-200 Mg-unit Tabs (Calcium carbonate-vitamin d) .... One daily 11)  Vitamin C-acerola 500 Mg Tabs (Ascorbic acid) .... One daily 12)  Lipitor 80 Mg Tabs (Atorvastatin calcium) .... Take one tablet daily. 13)  Polyethylene Glycol 1000 Powd (Polyethylene glycol 1000) .... Use 17g in liquid daily for constipation. 14)  Furosemide 20 Mg Tabs (Furosemide) .... Take 1 tablet by mouth daily. 15)  Fluocinonide 0.05 % Crea (Fluocinonide) .... Apply to affected areas two times a day. 16)  Premarin 0.625 Mg/gm Crea (Estrogens, conjugated) .... Apply intravaginally and externally as directed 17)  Loratadine 10 Mg Tabs (Loratadine) .... Take one tablet daily for sinuses. 18)  Anucort-hc 25 Mg Supp (Hydrocortisone acetate) .... Insert once day as needed for hemorrhoids.   Patient Instructions: 1)  Please schedule a follow-up appointment in 1 weeks. Prescriptions: ANUCORT-HC 25 MG SUPP (HYDROCORTISONE ACETATE) Insert once day as needed for hemorrhoids.  #15 x 0   Entered and Authorized by:   Hartley Barefoot MD   Signed by:   Hartley Barefoot MD on 03/11/2010   Method used:   Print then Give to Patient   RxID:   [id number] FUROSEMIDE 20 MG TABS (FUROSEMIDE) Take 1 tablet by mouth daily.  #30 x 0   Entered and Authorized by:   Hartley Barefoot MD   Signed by:   Hartley Barefoot MD on 03/11/2010   Method used:   Print then Give to Patient   RxID:   [id number]   Prevention & Chronic Care Immunizations   Influenza vaccine: Fluvax MCR  (07/26/2009)   Influenza vaccine due: 08/08/2008    Tetanus booster: 05/07/2009: Td    Pneumococcal vaccine: Not documented   Pneumococcal vaccine deferral: Deferred  (12/09/2009)    H. zoster vaccine: Not documented   H. zoster vaccine deferral: Deferred  (12/09/2009)  Colorectal Screening   Hemoccult: Not documented   Hemoccult action/deferral: Ordered  (12/09/2009)    Colonoscopy: Not documented    Colonoscopy action/deferral: Deferred  (12/09/2009)  Other Screening   Pap smear: Not documented   Pap smear action/deferral: Not indicated S/P hysterectomy  (05/14/2009)    Mammogram: ASSESSMENT: Negative - BI-RADS 1^MM DIGITAL SCREENING  (12/01/2009)   Mammogram due: 12/22/2010    DXA bone density scan: Not documented   DXA bone density action/deferral: Deferred  (12/09/2009)   Smoking status: quit  (03/11/2010)  Lipids   Total Cholesterol: 199  (02/08/2010)   Lipid panel action/deferral: Lipid Panel ordered   LDL: 116  (02/08/2010)   LDL Direct: Not documented   HDL: 48  (02/08/2010)   Triglycerides: 175  (02/08/2010)    SGOT (AST): 21  (02/08/2010)   BMP action: Ordered   SGPT (ALT): 21  (02/08/2010)   Alkaline phosphatase: 93  (02/08/2010)   Total bilirubin: 0.5  (02/08/2010)  Hypertension   Last Blood Pressure: 128 / 68  (03/11/2010)   Serum creatinine: 1.24  (05/14/2009)   Serum potassium 4.6  (05/14/2009)  Self-Management Support :   Personal Goals (by the next clinic visit) :      Personal blood pressure goal: 140/90  (12/09/2009)     Personal LDL goal: 130  (12/09/2009)    Patient will work on the following items until the next clinic visit to reach self-care goals:     Medications and monitoring: take my medicines every day, check my blood pressure, bring all of my  medications to every visit, weigh myself weekly  (03/11/2010)     Eating: eat more vegetables, use fresh or frozen vegetables, eat foods that are low in salt, eat baked foods instead of fried foods, eat fruit for snacks and desserts  (03/11/2010)     Activity: take a 30 minute walk every day  (03/11/2010)    Hypertension self-management support: Written self-care plan, Education handout, Resources for patients handout  (03/11/2010)   Hypertension self-care plan printed.   Hypertension education handout printed    Lipid self-management support: Written self-care plan, Education handout, Resources  for patients handout  (03/11/2010)   Lipid self-care plan printed.   Lipid education handout printed      Resource handout printed.

## 2010-12-01 NOTE — Letter (Signed)
Summary: Handout Printed  Printed Handout:  - *Patient Instructions 

## 2010-12-01 NOTE — Assessment & Plan Note (Signed)
Summary: n&v,sob, spitting up blood,coughing,pcp-sastry/ hla   Vital Signs:  Patient Profile:   70 Years Old Female Height:     65 inches (165.10 cm) Weight:      238.1 pounds (108.23 kg) BMI:     39.77 O2 Sat:      95 % O2 treatment:    Room Air Temp:     103.4 degrees F (39.67 degrees C) oral Pulse rate:   117 / minute Pulse (ortho):   122 / minute BP sitting:   136 / 75  (right arm) BP standing:   114 / 73  Pt. in pain?   yes    Location:   rt hip    Intensity:   10    Type:       sharp  Vitals Entered By: Chinita Pester RN (November 20, 2007 2:28 PM)              Is Patient Diabetic? No Nutritional Status BMI of > 30 = obese  Have you ever been in a relationship where you felt threatened, hurt or afraid?No   Does patient need assistance? Functional Status Self care Ambulation Impaired:Risk for fall Comments Uses a cane; sometimes a walker.     Serial Vital Signs/Assessments:  Time      Position  BP       Pulse  Resp  Temp     By 1515 P.M. Lying RA  144/77   115                   Chinita Pester RN 1515 P.M. Sitting   114/72   114                   Chinita Pester RN 1515 P.M. Standing  114/73   122                   Chinita Pester RN 1515P.M.                                  102.6 F  Chinita Pester RN   PCP:  [name] MD  Chief Complaint:  coughing; facial pressure; sorethroat;:blacking-out" x 2 weeks.  History of Present Illness: This is a 70 year old woman with past medical history of   Cervical cancer, hx of (ovarian cancer 1972 oophorectomy) GERD Gout Hypertension Low back pain,  Sec to DDD and DJD Osteoarthritis - both hips Transient ischemic attack, hx of Sensorineural hearing loss ( bilateral w/chronic L ear pain) CPAP Mitral valve prolapse Chronic venous insufficiency Diverticular Disease Hyperlipidemia Gout  Ms. Levitan is here today with many complaints including: R hip pain, back pain, syncope, facial pain, headaches, fevers and  chills, decreased appetite.  She mainly wants to discuss her hip pain.  She is incredibly tangential, and a very poor historian.  I decided for this visit to focus on her syncope and fevers, as they are the items that are most urgent.  Last seen in clinic on 11/26 after hospital admission for MRSA cellulitis and axilla lesion.  Head CT normal. ECHO EF 65%, LVH, mild increase in pulm art pressure. Today she reports that she has been passing out every other day for the past 3 weeks. She passed out today getting into the car, and her neighbor found her on the sidewalk, brought her into the house, and insisted she come here.  She passed out  yesterday in her bedroom, and woke up looking at the ceiling.  She gets dizzy and cold before she passes out.  She has not had any head injuries from the falls.  Had a cervical spinal surgery in 2007, and has been passing out frequently ever since.   She had these same complaints two months ago and at that time a head CT was normal, EKG and ECHO were normal.   She reports a history of stroke, may have had a TIA in november.  Regarding her fever:  she has had facial pain, head ache, cough, decreased appetite, no n/v/d, myalgias and fatigue for several days.  No chest pain or shortness of breath.         Current Allergies: ! SEPTRA ! ASA    Risk Factors:  Tobacco use:  quit    Year quit:  1999 Alcohol use:  no Exercise:  no Seatbelt use:  100 %  Mammogram History:    Date of Last Mammogram:  12/18/2006   Review of Systems  General      Complains of chills, fatigue, fever, loss of appetite, malaise, sleep disorder, sweats, and weakness.  Eyes      Complains of blurring and eye pain.  ENT      Complains of decreased hearing, difficulty swallowing, earache, nasal congestion, postnasal drainage, sinus pressure, and sore throat.  CV      Complains of fainting and fatigue.      Denies chest pain or discomfort, shortness of breath with exertion, and  swelling of feet.  Resp      Complains of cough and sputum productive.  GI      Complains of abdominal pain.      Denies bloody stools, change in bowel habits, constipation, dark tarry stools, and diarrhea.  GU      Denies dysuria.  MS      Complains of joint pain, loss of strength, cramps, and stiffness.   Physical Exam  General:     alert and overweight-appearing.   Head:     normocephalic and atraumatic.   Eyes:     vision grossly intact, pupils equal, pupils round, and pupils reactive to light.  no injection. Ears:     R ear normal and L ear normal.   Nose:     no external deformity.   Mouth:     pharynx pink and moist, no erythema, no exudates, and edentulous.   Neck:     supple and full ROM.   Chest Wall:     scars over sternum from abcesses Lungs:     normal respiratory effort and R diffuse crackles.   Heart:     normal rate, regular rhythm, and no murmur.   Abdomen:     soft, non-tender, and normal bowel sounds.   Pulses:     2+ Extremities:     no edema Skin:     turgor normal and color normal.   Cervical Nodes:     R anterior LN tender and L anterior LN tender.   Psych:     Oriented X3, severely anxious, easily distracted, poor concentration, and hyperactive.      Impression & Recommendations:  Problem # 1:  SYNCOPE (ICD-780.2) This symptom is worrysome, but seems to have been going on for a long time, with no falls or accidents.  I am not sure she is truely passing out.  Today orthostatics are positive, she is dehydrated, most likely 2/2 to her general  malaise and URI.  EKG today is normal. Last ECHO 11/08 normal.  Encouraged consumption of fluids.  Routine labs drawn.  She will return in a week.  Orders: 12 Lead EKG (12 Lead EKG)   Problem # 2:  URI (ICD-465.9) She has symptoms of a viral URI. Recomended OTC robitussin DM for cough and congestion, and tylenol for fever.   Her updated medication list for this problem includes:    Tylenol  325 Mg Tabs (Acetaminophen) .[name]... Take 2 tablet by mouth four times a day for pain    Piroxicam 20 Mg Caps (Piroxicam) .[name]... Take 1 tablet by mouth once a day  Orders: T-Basic Metabolic Panel [phone]) T-CBC w/Diff [phone])   Complete Medication List: 1)  Amitriptyline Hcl 50 Mg Tabs (Amitriptyline hcl) .... Take 1 tablet by mouth at bedtime 2)  Allopurinol 300 Mg Tabs (Allopurinol) .... Take 1 tablet by mouth once a day 3)  Klor-con 10 10 Meq Tbcr (Potassium chloride) .... Take 1 tablet by mouth two times a day 4)  Plavix 75 Mg Tabs (Clopidogrel bisulfate) .... Take 1 tablet by mouth once a day 5)  Lisinopril 40 Mg Tabs (Lisinopril) .... Take 1 tablet by mouth once a day 6)  Protonix 40 Mg Tbec (Pantoprazole sodium) .... Take 1 tablet by mouth once a day 7)  Vicodin 5-500 Mg Tabs (Hydrocodone-acetaminophen) .... Take 1 tab every 4-6 hrs as needed 8)  Detrol La 4 Mg Cp24 (Tolterodine tartrate) .... Take 1 tablet by mouth once at bedtime 9)  Lactulose Soln (Lactulose soln) .... Take 30ml till yo have bm, repeat 4 times in a day. 10)  Wellbutrin Sr 150 Mg Tb12 (Bupropion hcl) .... Take one pill every day for three days, then start taking one pill twice daily. 11)  Tylenol 325 Mg Tabs (Acetaminophen) .... Take 2 tablet by mouth four times a day for pain 12)  Caduet 5-80 Mg Tabs (Amlodipine-atorvastatin) .... Take 1 tablet by mouth once a day 13)  Hydrochlorothiazide 25 Mg Tabs (Hydrochlorothiazide) .... Take 1 tablet by mouth once a day 14)  Piroxicam 20 Mg Caps (Piroxicam) .... Take 1 tablet by mouth once a day 15)  Fluconazole 150 Mg Tabs (Fluconazole) .... One pill once only. 16)  Fluconazole 150 Mg Tabs (Fluconazole) .... Take 1 by mouth x 1 dose   Patient Instructions: 1)  Please schedule a follow-up appointment in 1 week. 2)  Stop taking Caduet for the next week until your appointment. 3)  You had blood work done today, I will call you if there is anything that needs to be  addressed before your appointment. 4)  You have should take Tussin DM for your cough and cold symptoms. 5)  You should take tylenol 325mg  every 4 hrs for your fever. 6)  Drink lots of fluids!    Prescriptions: FLUCONAZOLE 150 MG  TABS (FLUCONAZOLE) One pill once only.  #2 x 0   Entered and Authorized by:   Elby Showers MD   Signed by:   Elby Showers MD on 11/20/2007   Method used:   Electronically sent to ...       [facility]  [address] [phone]*       [address]       [place], [place]  [address]       Ph: [phone] or [phone]       Fax: [phone]   RxID:   [id number]  ]  Appended  Document: n&v,sob, spitting up blood,coughing,pcp-sastry/ hla Pt was instructed to hold her carduet for this week due to orthostatic hypotension.  She will return in one week for re-evaluation and can be restarted at that time if BP is stable.

## 2010-12-01 NOTE — Miscellaneous (Signed)
Summary: [facility]  [facility]   Imported By: Shon Hough 08/24/2010 10:04:52  _____________________________________________________________________  External Attachment:    Type:   Image     Comment:   External Document

## 2010-12-01 NOTE — Progress Notes (Signed)
Summary: phone/gg  Phone Note Call from Patient   Caller: Patient Complaint: Urinary/GYN Problems Summary of Call: Pt called with c/o pain in left hip and left leg  for 1 year since hip replacement. She states therapy was canceled because of pain. She states Dr Ophelia Charter will not see her without a co-pay for $40.  She can do a payment plan?  Call Dr Ophelia Charter tomorrow Pt # [phone] Initial call taken by: Merrie Roof RN,  April 12, 2010 4:22 PM  Follow-up for Phone Call        i spoke w/ lauren, dr Ophelia Charter nurse, she stated the last communication they had w/ pt was 5/31 and it was not concerning the hip, the call before that had been march and it was to ask for medicine at which time she was given celebrex. the last time she was seen was october 2010 for neck pain on 2 consecutive days. she states that the pt was not one of those who received the recalled hip. she does state that pt's copay is $40.00. she has now called back and states that she was told by the office insurance person that it is against the law to waive a copay...i spoke w/ d hill and she doesn't believe this to be true but does not know this as fact. lauren does state she has sent dr yates a message re: this and is waiting on an answer, she will call [facility] back and speak to Makaiya Geerdes or helen w/ the answer.  Follow-up by: Marin Roberts RN,  April 13, 2010 2:13 PM  Additional Follow-up for Phone Call Additional follow up Details #1::        Received a return call from Dr Ophelia Charter office and they will see pt.  She has a scheduled appointment next week with Dr Ophelia Charter.  She will make co-pay in small increments. I was also informed that she did NOT receive the hip prosthesis that was recalled !! Additional Follow-up by: Merrie Roof RN,  April 14, 2010 10:02 AM

## 2010-12-01 NOTE — Assessment & Plan Note (Signed)
Summary: Soc. Work   Social Work Evaluation Date  03/11/2010 Patient name [name]  Primary MD   : Elby Showers MD Social Worker's name : Dorothe Pea MSW- LCSW  Home [phone]    Cell phone: .  [name]     Alternate phone: . [name]       Individual making referral: Deb Ditzler  Primary Reason for Referral:     Other Comments Patient reports left hip problems that she has reported to her orthopedist but he said hip is fine.   She reported a hx of falling on her laminate floors. Patient has new walker from [facility].  Requesting pads and lift chair.  Also does not have Bedside Commode which may be of help to her in preventing falls to the bathroom at night.   Patient already had PT through [facility].   Patient has Medicare only.  Does not have full Medicaid but does have [employer] which pays for Part B premiums.  Action taken by Social Work: I have asked Deb to ask Dr. Sunnie Nielsen to sign orders for pads, lift chair and BSC and fax to [facility] to see if [employer] will cover.    There are no other at home services that are available for [name] because she is Medicare only.  The [facility] program is not accepting names to the wait list and I have a call into [facility] to see if patient would meet income critieria for that program. SW is available as needed should [name] no longer wish to remain in the home and explore assisted living.

## 2010-12-01 NOTE — Assessment & Plan Note (Signed)
Summary: 2WK FU/EST/VS   Vital Signs:  Patient Profile:   70 Years Old Female Height:     65 inches (165.10 cm) Weight:      228.4 pounds (103.82 kg) BMI:     38.15 Temp:     97.2 degrees F (36.22 degrees C) oral Pulse rate:   69 / minute BP sitting:   128 / 78  (right arm)  Pt. in pain?   no  Vitals Entered By: Filomena Jungling NT II (May 13, 2008 1:42 PM)              Is Patient Diabetic? No Nutritional Status BMI of 25 - 29 = overweight  Have you ever been in a relationship where you felt threatened, hurt or afraid?No   Does patient need assistance? Functional Status Self care Ambulation Impaired:Risk for fall Comments patient has walker     PCP:  [name] MD  Chief Complaint:  FOLLOW-UP VISIT.  History of Present Illness: This is a 70  year old woman with past medical history of   Cervical cancer, hx of (ovarian cancer 1972 oophorectomy) GERD Gout Hypertension Low back pain,  Sec to DDD and DJD Osteoarthritis - both hips Transient ischemic attack, hx of Sensorineural hearing loss ( bilateral w/chronic L ear pain) CPAP Mitral valve prolapse Chronic venous insufficiency Diverticular Disease Hyperlipidemia Boils Gout  Ms. Hall Busing was seen two weeks ago with hip pain and intertrigo.  She is here for follow up and reports that her rash has gone completely away.  She is very happy with this result.  She has had a cough with white sputum which has been chronic and worse with season change.  It has gotten worse in the past two weeks.  No fevers or chills.  Hip pain is stable.        Current Allergies: ! SEPTRA ! ASA    Risk Factors:  Tobacco use:  quit    Year quit:  1999    Pack-years:  50 Passive smoke exposure:  yes Alcohol use:  no Exercise:  no Seatbelt use:  100 %  Mammogram History:    Date of Last Mammogram:  12/18/2006   Review of Systems  General      Denies chills, fever, and malaise.  CV      Denies chest pain or  discomfort, difficulty breathing at night, difficulty breathing while lying down, fainting, leg cramps with exertion, and palpitations.  Resp      Complains of cough.      Denies chest discomfort, chest pain with inspiration, and shortness of breath.  GI      Denies diarrhea, nausea, and vomiting.   Physical Exam  General:     alert and overweight-appearing.   Head:     normocephalic and atraumatic.   Neck:     supple and no masses.   Lungs:     normal respiratory effort and normal breath sounds.   Heart:     normal rate, regular rhythm, and no murmur.   Pulses:     2+ Extremities:     no edema Neurologic:     alert & oriented X3 and cranial nerves II-XII intact.      Impression & Recommendations:  Problem # 1:  INTERTRIGO, CANDIDAL (ICD-695.89) Resolved with chlotramazole cream and diflucan 100mg  for 12 days.  She will keep using nystatin powder.   Problem # 2:  HIP PAIN, RIGHT, CHRONIC (ICD-719.45) Still with hip pain.  She has  seen been seen by Dr Blanche East (neurosurgery) who recomends NSAIDS, weight loss and physical therapy.  She has been going to PT and seems to be working.  She has a new prescription from piroxicam from Dr. Ophelia Charter (ortho).  She understands that this will be a chronic issue.   Her updated medication list for this problem includes:    Tylenol 325 Mg Tabs (Acetaminophen) .[name]... Take 2 tablet by mouth four times a day for pain    Darvon-n 100 Mg Tabs (Propoxyphene napsylate) .[name]... Take 1 tablet by mouth every four hours as needed for pain    Piroxicam 20 Mg Caps (Piroxicam) .[name]... Take one tablet daily.   Problem # 3:  GOUT (ICD-274.9) Needs a refill of allupurinol, will write for it. No recent gouty flares.   Her updated medication list for this problem includes:    Allopurinol 300 Mg Tabs (Allopurinol) .[name]... Take 1 tablet by mouth once a day    Piroxicam 20 Mg Caps (Piroxicam) .[name]... Take one tablet daily.   Problem # 4:  HYPERLIPIDEMIA  (ICD-272.4)  Her updated medication list for this problem includes:    Caduet 5-80 Mg Tabs (Amlodipine-atorvastatin) .[name]... Take 1 tablet by mouth once a day   Complete Medication List: 1)  Amitriptyline Hcl 50 Mg Tabs (Amitriptyline hcl) .... Take 1 tablet by mouth at bedtime 2)  Allopurinol 300 Mg Tabs (Allopurinol) .... Take 1 tablet by mouth once a day 3)  Klor-con 10 10 Meq Tbcr (Potassium chloride) .... Take 1 tablet by mouth two times a day 4)  Plavix 75 Mg Tabs (Clopidogrel bisulfate) .... Take 1 tablet by mouth once a day 5)  Lisinopril 40 Mg Tabs (Lisinopril) .... Take 1 tablet by mouth once a day 6)  Protonix 40 Mg Tbec (Pantoprazole sodium) .... Take 1 tablet by mouth once a day 7)  Detrol La 4 Mg Cp24 (Tolterodine tartrate) .... Take 1 tablet by mouth once at bedtime 8)  Lactulose Soln (Lactulose soln) .... Take 30ml till yo have bm, repeat 4 times in a day. 9)  Wellbutrin Sr 150 Mg Tb12 (Bupropion hcl) .... Take one pill every day for three days, then start taking one pill twice daily. 10)  Tylenol 325 Mg Tabs (Acetaminophen) .... Take 2 tablet by mouth four times a day for pain 11)  Caduet 5-80 Mg Tabs (Amlodipine-atorvastatin) .... Take 1 tablet by mouth once a day 12)  Hydrochlorothiazide 25 Mg Tabs (Hydrochlorothiazide) .... Take 1 tablet by mouth once a day 13)  Nyamyc 100000 Unit/gm Powd (Nystatin) .... Apply to dry skin twice a day 14)  Clotrimazole Anti-fungal 1 % Crea (Clotrimazole) .... Apply to the area 3-4 times per day 15)  Darvon-n 100 Mg Tabs (Propoxyphene napsylate) .... Take 1 tablet by mouth every four hours as needed for pain 16)  Diflucan 100 Mg Tabs (Fluconazole) .... Take 1 tablet by mouth once a day 17)  Piroxicam 20 Mg Caps (Piroxicam) .... Take one tablet daily.   Patient Instructions: 1)  Please schedule a follow-up appointment in 6 months. 2)  Keep going to physical therapy, it will help your hip pain!   Prescriptions: PLAVIX 75 MG TABS  (CLOPIDOGREL BISULFATE) Take 1 tablet by mouth once a day  #30 x 6   Entered and Authorized by:   Elby Showers MD   Signed by:   Elby Showers MD on 05/13/2008   Method used:   Electronically sent to ...       [facility] [address] [phone]*       [address]       [place], [place]  [address]       Ph: [phone] or [phone]       Fax: [phone]   RxID:   [id number] ALLOPURINOL 300 MG TABS (ALLOPURINOL) Take 1 tablet by mouth once a day  #30 x 2   Entered and Authorized by:   Elby Showers MD   Signed by:   Elby Showers MD on 05/13/2008   Method used:   Electronically sent to ...       [facility] [address] [phone]*       [address]       [place], [place]  [address]       Ph: [phone] or [phone]       Fax: [phone]   RxID:   [id number]  ]  Vital Signs:  Patient Profile:   71 Years Old Female Height:     65 inches (165.10 cm) Weight:      228.4 pounds (103.82 kg) BMI:     38.15 Temp:     97.2 degrees F (36.22 degrees C) oral Pulse rate:   69 / minute BP sitting:   128 / 78

## 2010-12-01 NOTE — Progress Notes (Signed)
Summary: Refill/gh  Phone Note Refill Request Message from:  Pharmacy on September 02, 2010 10:35 AM  Refills Requested: Medication #1:  Tramadol 50 mg  1 po every 8 hours  Medication #2:  PREMARIN 0.625 MG/GM CREA Apply intravaginally and externally as directed Last office visit was 08/26/2010.  06/2010 last labs.   Method Requested: Fax to Local Pharmacy Initial call taken by: Angelina Ok RN,  September 02, 2010 10:36 AM  Follow-up for Phone Call        I am going to have to deny the tramadol as I cannot see that she was ever Rx'd it here.    Prescriptions: PREMARIN 0.625 MG/GM CREA (ESTROGENS, CONJUGATED) Apply intravaginally and externally as directed  #1 tube x 5   Entered and Authorized by:   Blanch Media MD   Signed by:   Blanch Media MD on 09/02/2010   Method used:   Faxed to ...       [facility] (retail)       [address]       [place], [place]  [address]       Ph: [phone]       Fax: [phone]   RxID:   [id number]

## 2010-12-01 NOTE — Assessment & Plan Note (Signed)
Summary: "fell out" at PT wed 4/6 /pcp-walsh/hla   Vital Signs:  Patient profile:   70 year old female Height:      65 inches (165.10 cm) Weight:      239.9 pounds (109.05 kg) BMI:     40.07 Temp:     98.2 degrees F oral Pulse rate:   77 / minute Pulse (ortho):   73 / minute BP sitting:   139 / 74  (left arm) BP standing:   150 / 77  Vitals Entered By: Chinita Pester RN (February 08, 2010 1:41 PM)  Serial Vital Signs/Assessments:  Time      Position  BP       Pulse  Resp  Temp     By 2:35PM    Lying RA  141/81   66                    Glenda Palmer RN 2:35PM    Sitting   147/75   73                    Chinita Pester RN 2:35PM    Standing  150/77   73                    Chinita Pester RN  CC: When she went to PT, she became dizzy, felt like she was going to pass out and pounding in her chest.. States she cannot stay awake. Is Patient Diabetic? No Pain Assessment Patient in pain? yes     Location: bilateral hips Intensity: 5 Type: sharp Onset of pain  Chronic; w/activity Nutritional Status BMI of > 30 = obese  Have you ever been in a relationship where you felt threatened, hurt or afraid?No   Does patient need assistance? Functional Status Self care Ambulation Impaired:Risk for fall Comments using a rolling walker   Primary Care Provider:  Elby Showers MD  CC:  When she went to PT, she became dizzy, and felt like she was going to pass out and pounding in her chest.. States she cannot stay awake.[name]  History of Present Illness: 68y of with pmh outlined in this chart here with report of dizzyness last week during physical therapy.  This was acompanied by chest pain and tightness, also shorntess of breath and a feeling like there was an ice pick in her chest.  BP was checked and it was normal, 02 checked and was normal.  No swelling in her legs, no production with her cough.  She reports profound fatigue for several months, sleeps all the time, awake only a few hours a day.   She does not know what is causing this.  Has a cough and runny nose that she can not get rid of.  Has very hot spells with diaphoresis.  When she stands up she gets very dizzy.    Depression History:      The patient denies a depressed mood most of the day and a diminished interest in her usual daily activities.         Preventive Screening-Counseling & Management  Alcohol-Tobacco     Alcohol drinks/day: 0     Smoking Status: quit     Year Quit: 1999     Pack years: 50     Passive Smoke Exposure: yes  Caffeine-Diet-Exercise     Does Patient Exercise: no  Current Medications (verified): 1)  Protonix 40 Mg Pack (Pantoprazole Sodium) .... Take One Tablet Daily.  2)  Oxybutynin Chloride 5 Mg Xr24h-Tab (Oxybutynin Chloride) .... Take One Tablet At Bedtime. 3)  Amlodipine Besylate 5 Mg Tabs (Amlodipine Besylate) .... Take One Tablet Daily. 4)  Clotrimazole Anti-Fungal 1 %  Crea (Clotrimazole) .... Apply To The Area 3-4 Times Per Day 5)  Tramadol Hcl 50 Mg Tabs (Tramadol Hcl) .... Take One Tablet Every 6 Hrs As Needed For Pain. 6)  Allopurinol 300 Mg Tabs (Allopurinol) .[name].. 1 Tablet Once A Day 7)  Plavix 75 Mg Tabs (Clopidogrel Bisulfate) .[name].. 1 Tablet Daily. 8)  Lisinopril 40 Mg Tabs (Lisinopril) .[name].. 1 Tablet Once A Day. 9)  Biotin 5000 5 Mg Caps (Biotin) .... Take One Tablet Daily. 10)  Apple Cider Vinegar 300 Mg Tabs (Apple Cider Vinegar) .... One Daily 11)  Calcium 500/d 500-200 Mg-Unit Tabs (Calcium Carbonate-Vitamin D) .... One Daily 12)  Vitamin C-Acerola 500 Mg Tabs (Ascorbic Acid) .... One Daily 13)  Lipitor 80 Mg Tabs (Atorvastatin Calcium) .... Take One Tablet Daily. 14)  Polyethylene Glycol 1000  Powd (Polyethylene Glycol 1000) .... Use 17g in Liquid Daily For Constipation. 15)  Hydrochlorothiazide 25 Mg Tabs (Hydrochlorothiazide) .... Take One Tablet Daily. 16)  Fluocinonide 0.05 % Crea (Fluocinonide) .... Apply To Affected Areas Two Times A Day. 17)  Premarin 0.625  Mg/gm Crea (Estrogens, Conjugated) .... Apply Intravaginally and Externally As Directed 18)  Loratadine 10 Mg Tabs (Loratadine) .... Take One Tablet Daily For Sinuses.  Allergies (verified): 1)  ! Septra 2)  ! Asa  Review of Systems       per hpi  Physical Exam  General:  alert and overweight-appearing.   Head:  normocephalic and atraumatic.   Eyes:  vision grossly intact, pupils equal, pupils round, and pupils reactive to light.   Nose:  no external deformity.   Mouth:  pharynx pink and moist.   Lungs:  normal respiratory effort and normal breath sounds.   Heart:  normal rate, regular rhythm, no murmur, and no gallop.   Abdomen:  soft, non-tender, normal bowel sounds, and no distention.   Pulses:  +1 Extremities:  no edema Neurologic:  alert & oriented X3 and cranial nerves II-XII intact. Uses rolling wakler for mobility and balance.  Psych:  Oriented X3, good eye contact, and slightly anxious.     Impression & Recommendations:  Problem # 1:  DIZZINESS (ICD-780.4) Report of an incidence of dizzyness with stabbing chest pain, shortness of breath, and chest pressure during exertion.  Has not recurred for one week.  Also in the setting of months of fatigue so bad she is only awake for a few hours each day.  Will: check orthostatics (lying down 141/81, 66, sitting 147/75, 73, standing 150/77, 73) Not orthostatic. check EKG (normal) check routine labs (CBC, CMET, TSH, A1C, BNP) Will not check ECHO, in 2009 was normal with mild AV calcification, no edema, lung exam normal, repeat ECHO not likely to be helpful. Will get repeat sleep study, had a sleep study years ago and was given a CPAP but can not use it because of tossing and turning. Consider medication effect: amlodipine? seems unlikely as this has been a long term medication.  Her updated medication list for this problem includes:    Loratadine 10 Mg Tabs (Loratadine) .[name]... Take one tablet daily for sinuses.  Orders: 12 Lead  EKG (12 Lead EKG) T-TSH ([id number]) T-CBC No Diff ([id number]) T-BNP  (B Natriuretic Peptide) ([id number]) Sleep Disorder Referral (Sleep Disorder)  Problem # 2:  HYPERTENSION (ICD-401.9) BP OK  today, no changes.  Her updated medication list for this problem includes:    Amlodipine Besylate 5 Mg Tabs (Amlodipine besylate) .[name]... Take one tablet daily.    Lisinopril 40 Mg Tabs (Lisinopril) .[name]... 1 tablet once a day.    Hydrochlorothiazide 25 Mg Tabs (Hydrochlorothiazide) .[name]... Take one tablet daily.  BP today: 139/74 Prior BP: 117/65 (12/09/2009)  Prior 10 Yr Risk Heart Disease: 15 % (04/29/2008)  Labs Reviewed: K+: 4.6 (05/14/2009) Creat: : 1.24 (05/14/2009)   Chol: 177 (12/15/2008)   HDL: 43 (12/15/2008)   LDL: 92 (12/15/2008)   TG: 208 (12/15/2008)  Problem # 3:  HYPERLIPIDEMIA (ICD-272.4) recheck lipids and cmet today  Her updated medication list for this problem includes:    Lipitor 80 Mg Tabs (Atorvastatin calcium) .[name]... Take one tablet daily.  Orders: T-Lipid Profile ([id number])  Her updated medication list for this problem includes:    Lipitor 80 Mg Tabs (Atorvastatin calcium) .[name]... Take one tablet daily.  Problem # 4:  DEPRESSION (ICD-311) This is actually the best I have ever seen Ms. Noguez from a psych stand point.  She is deaf and talks without interuption, which is her norm... but she seems upbeat.  She talks a lot about a friend and the recent visit of a neice; this social connection might be what is lifting her spirits.  She does mention again how unsafe her neighborhood is, that she is scared of her neighbors (that she would like a moterized scooter to get away from them if needed), that her two sons are in jail.  Complete Medication List: 1)  Protonix 40 Mg Pack (Pantoprazole sodium) .... Take one tablet daily. 2)  Oxybutynin Chloride 5 Mg Xr24h-tab (Oxybutynin chloride) .... Take one tablet at bedtime. 3)  Amlodipine Besylate 5 Mg Tabs (Amlodipine  besylate) .... Take one tablet daily. 4)  Clotrimazole Anti-fungal 1 % Crea (Clotrimazole) .... Apply to the area 3-4 times per day 5)  Tramadol Hcl 50 Mg Tabs (Tramadol hcl) .... Take one tablet every 6 hrs as needed for pain. 6)  Allopurinol 300 Mg Tabs (Allopurinol) .[name].. 1 tablet once a day 7)  Plavix 75 Mg Tabs (Clopidogrel bisulfate) .[name].. 1 tablet daily. 8)  Lisinopril 40 Mg Tabs (Lisinopril) .[name].. 1 tablet once a day. 9)  Biotin 5000 5 Mg Caps (Biotin) .... Take one tablet daily. 10)  Apple Cider Vinegar 300 Mg Tabs (Apple cider vinegar) .... One daily 11)  Calcium 500/d 500-200 Mg-unit Tabs (Calcium carbonate-vitamin d) .... One daily 12)  Vitamin C-acerola 500 Mg Tabs (Ascorbic acid) .... One daily 13)  Lipitor 80 Mg Tabs (Atorvastatin calcium) .... Take one tablet daily. 14)  Polyethylene Glycol 1000 Powd (Polyethylene glycol 1000) .... Use 17g in liquid daily for constipation. 15)  Hydrochlorothiazide 25 Mg Tabs (Hydrochlorothiazide) .... Take one tablet daily. 16)  Fluocinonide 0.05 % Crea (Fluocinonide) .... Apply to affected areas two times a day. 17)  Premarin 0.625 Mg/gm Crea (Estrogens, conjugated) .... Apply intravaginally and externally as directed 18)  Loratadine 10 Mg Tabs (Loratadine) .... Take one tablet daily for sinuses.  Other Orders: T-Hepatic Function [phone] [phone]) T-Hgb A1C (in-house) [phone])  Patient Instructions: 1)  You had labwork done today, we will call you if there is anything that needs to be addressed. 2)  You will be scheduled for a sleep study to see if you are breathing OK in your sleep. 3)  Please schedule a follow-up appointment in 3 months.  Prevention & Chronic Care Immunizations   Influenza  vaccine: Fluvax MCR  (07/26/2009)   Influenza vaccine due: 08/08/2008    Tetanus booster: 05/07/2009: Td    Pneumococcal vaccine: Not documented   Pneumococcal vaccine deferral: Deferred  (12/09/2009)    H. zoster vaccine: Not documented   H.  zoster vaccine deferral: Deferred  (12/09/2009)  Colorectal Screening   Hemoccult: Not documented   Hemoccult action/deferral: Ordered  (12/09/2009)    Colonoscopy: Not documented   Colonoscopy action/deferral: Deferred  (12/09/2009)  Other Screening   Pap smear: Not documented   Pap smear action/deferral: Not indicated S/P hysterectomy  (05/14/2009)    Mammogram: ASSESSMENT: Negative - BI-RADS 1^MM DIGITAL SCREENING  (12/01/2009)   Mammogram due: 12/22/2010    DXA bone density scan: Not documented   DXA bone density action/deferral: Deferred  (12/09/2009)   Smoking status: quit  (02/08/2010)  Lipids   Total Cholesterol: 177  (12/15/2008)   Lipid panel action/deferral: Lipid Panel ordered   LDL: 92  (12/15/2008)   LDL Direct: Not documented   HDL: 43  (12/15/2008)   Triglycerides: 208  (12/15/2008)    SGOT (AST): 14  (05/14/2009)   BMP action: Ordered   SGPT (ALT): 13  (05/14/2009)   Alkaline phosphatase: 97  (05/14/2009)   Total bilirubin: 0.5  (05/14/2009)    Lipid flowsheet reviewed?: Yes   Progress toward LDL goal: Unchanged  Hypertension   Last Blood Pressure: 139 / 74  (02/08/2010)   Serum creatinine: 1.24  (05/14/2009)   Serum potassium 4.6  (05/14/2009)    Hypertension flowsheet reviewed?: Yes   Progress toward BP goal: Unchanged  Self-Management Support :   Personal Goals (by the next clinic visit) :      Personal blood pressure goal: 140/90  (12/09/2009)     Personal LDL goal: 130  (12/09/2009)    Patient will work on the following items until the next clinic visit to reach self-care goals:     Medications and monitoring: check my blood pressure  (02/08/2010)     Eating: eat more vegetables, eat baked foods instead of fried foods  (02/08/2010)    Hypertension self-management support: Education handout, Resources for patients handout, Written self-care plan  (02/08/2010)   Hypertension self-care plan printed.   Hypertension education handout  printed    Lipid self-management support: Education handout, Resources for patients handout, Written self-care plan  (02/08/2010)   Lipid self-care plan printed.   Lipid education handout printed      Resource handout printed.  Process Orders Check Orders Results:     [facility]: Check successful Tests Sent for requisitioning (February 10, 2010 8:36 AM):     02/08/2010: [facility] -- T-Lipid Profile [phone] (signed)     02/08/2010: [facility] -- T-Hepatic Function [phone] (signed)     02/08/2010: [facility] -- T-TSH [phone] (signed)     02/08/2010: [facility] -- T-CBC No Diff [[id number]] (signed)     02/08/2010: [facility] -- T-BNP  (B Natriuretic Peptide) [phone] (signed)    Laboratory Results   Blood Tests   Date/Time Received: February 08, 2010 3:05 PM. Date/Time Reported: Alric Quan  February 08, 2010 3:05 PM   HGBA1C: 6.3%   (Normal Range: Non-Diabetic - 3-6%   Control Diabetic - 6-8%)

## 2010-12-01 NOTE — Assessment & Plan Note (Signed)
Summary: R underarm "boil"/pcp-sastry/ hla   Vital Signs:  Patient Profile:   70 Years Old Female Height:     65 inches (165.10 cm) Weight:      234.3 pounds (106.50 kg) BMI:     39.13 O2 Sat:      98 % O2 treatment:    Room Air Temp:     98.0 degrees F (36.67 degrees C) oral Pulse rate:   99 / minute BP sitting:   136 / 75  Pt. in pain?   no  Vitals Entered By: Chinita Pester RN (November 28, 2007 3:41 PM)              Is Patient Diabetic? No Nutritional Status BMI of > 30 = obese  Have you ever been in a relationship where you felt threatened, hurt or afraid?No   Does patient need assistance? Functional Status Self care Ambulation Impaired:Risk for fall     PCP:  [name] MD  Chief Complaint:  Rt underarm "boil"; burns when urinates.  History of Present Illness: This is a 70 year old woman with past medical history of multiple medical issues.  She is extremely dramatic.  She was here last week and was agitated, excited, in intense pain, describing daily syncope for months and was sure she should be admitted.  Today she reports the pain is under control and the syncope has resolved.  She is very depressed seeming.  Here today with a R axilar boil and dysuria.  She is very depressed, believes she is going to die, feels that she might have cancer, has told her priest she is going to die this month.  She states she has been in the hospital 7 times this year (which is not supported in [place]) and feels she can not take anymore.  Her cancer fears seem to be related to a lung lesion which is being followed with serial CT's by Dr. Edwyna Shell and has been stable for almost 2 years.  Her anxiety about the boil is related to the surgeon who did the last I&D of this area in 5/08, Dr. Michaell Cowing,  who she is scared of and does not want to return to.  Feels she can not take any more medications, especially antibiotics.  She just noticed a day ago that her R axilla is sore when she puts the  shower stream on it.  It has not opened or drained but is very painful.  She has had no fevers, chills, n/v/d or other systemic symptoms.  She also reports that the fluconazole did not help her yeast infection.  Infact, the area burns so badly she can not urinate, and she has been itching the area so much she has broken the skin.  She reports no incease in discharge or odor.        Current Allergies: ! SEPTRA ! ASA    Risk Factors: Tobacco use:  quit    Year quit:  1999 Alcohol use:  no Exercise:  no Seatbelt use:  100 %  Mammogram History:    Date of Last Mammogram:  12/18/2006   Review of Systems  General      Complains of loss of appetite and malaise.      Denies chills, fatigue, fever, sleep disorder, sweats, and weakness.  CV      Denies chest pain or discomfort, fainting, and near fainting.  GI      Denies abdominal pain, change in bowel habits, nausea, and vomiting.  GU      Complains of dysuria and genital sores.      Denies abnormal vaginal bleeding, discharge, and hematuria.   Physical Exam  General:     alert and overweight-appearing.   Lungs:     normal respiratory effort and normal breath sounds.   Heart:     normal rate, regular rhythm, and no murmur.   Abdomen:     soft, non-tender, and normal bowel sounds.   Genitalia:     Thick, indurated skin, with some skin breaks from itching over the pubic mons, in the inguinal creases and over the vulvar skin.  no vaginal lesions, mucosa is pink and moist.  minimal discharge.  Skin:     R axilla very ttp.  No specific mass, skin break, erythma or drainage.  There is a scar from previous I&D with flaking skin over it.  No lesions in L axilla.  No other painful lesions. Cervical Nodes:     no anterior cervical adenopathy and no posterior cervical adenopathy.   Psych:     Oriented X3, memory intact for recent and remote, depressed affect, withdrawn, poor eye contact, moderately anxious, easily distracted,  and poor concentration.      Impression & Recommendations:  Problem # 1:  ABSCESS, AXILLA, RIGHT (ICD-682.3) Examined with Dr. Aundria Rud. At this point there looks like the very begining of a faruncle or cellulits in the R axilla.  She is very resistant to additional medications.  She is very fearful of having another I&D.  As there is no collection indicating a faruncle or specific area that looks cellulitis, recomended warm compresses several times a day, and return to clinic if a boil forms for drainage and treatment.  Problem # 2:  DYSURIA (ICD-788.1) Examined with Dr. Aundria Rud.  Exam indicates intertrigo in the creases of legs, over pubic mons and vulvar skin.  Prescribed nystatin cream over the affected areas three times a day, and keeping the area dry.  She does have some incontinence and wears pads, which is probably keeping the area moist.  Also her UA is grossly positive, so will need to treat for UTI.  Sent wet prep.  Her updated medication list for this problem includes:    Detrol La 4 Mg Cp24 (Tolterodine tartrate) .[name]... Take 1 tablet by mouth once at bedtime  Orders: T-Wet Prep (in-house) (16109UE) T-Urinalysis Dipstick only (45409WJ)   Problem # 3:  Preventive Health Care (ICD-V70.0) has had hysterectomy next mam scheduled for feb next CT with Dr Edwyna Shell is in feb last colonoscopy 4 yrs ago   Problem # 4:  URI (ICD-465.9)  Her updated medication list for this problem includes:    Tylenol 325 Mg Tabs (Acetaminophen) .[name]... Take 2 tablet by mouth four times a day for pain    Piroxicam 20 Mg Caps (Piroxicam) .[name]... Take 1 tablet by mouth once a day    Benzonatate 100 Mg Caps (Benzonatate) .[name]... Take one capsule three times a day for 10 days.   Complete Medication List: 1)  Amitriptyline Hcl 50 Mg Tabs (Amitriptyline hcl) .... Take 1 tablet by mouth at bedtime 2)  Allopurinol 300 Mg Tabs (Allopurinol) .... Take 1 tablet by mouth once a day 3)  Klor-con 10 10 Meq Tbcr  (Potassium chloride) .... Take 1 tablet by mouth two times a day 4)  Plavix 75 Mg Tabs (Clopidogrel bisulfate) .... Take 1 tablet by mouth once a day 5)  Lisinopril 40 Mg Tabs (Lisinopril) .... Take 1  tablet by mouth once a day 6)  Protonix 40 Mg Tbec (Pantoprazole sodium) .... Take 1 tablet by mouth once a day 7)  Vicodin 5-500 Mg Tabs (Hydrocodone-acetaminophen) .... Take 1 tab every 4-6 hrs as needed 8)  Detrol La 4 Mg Cp24 (Tolterodine tartrate) .... Take 1 tablet by mouth once at bedtime 9)  Lactulose Soln (Lactulose soln) .... Take 30ml till yo have bm, repeat 4 times in a day. 10)  Wellbutrin Sr 150 Mg Tb12 (Bupropion hcl) .... Take one pill every day for three days, then start taking one pill twice daily. 11)  Tylenol 325 Mg Tabs (Acetaminophen) .... Take 2 tablet by mouth four times a day for pain 12)  Caduet 5-80 Mg Tabs (Amlodipine-atorvastatin) .... Take 1 tablet by mouth once a day 13)  Hydrochlorothiazide 25 Mg Tabs (Hydrochlorothiazide) .... Take 1 tablet by mouth once a day 14)  Piroxicam 20 Mg Caps (Piroxicam) .... Take 1 tablet by mouth once a day 15)  Fluconazole 150 Mg Tabs (Fluconazole) .... One pill once only. 16)  Fluconazole 150 Mg Tabs (Fluconazole) .... Take 1 by mouth x 1 dose 17)  Nystatin 100000 Unit/gm Crea (Nystatin) .... Apply three times a day to affected area. 18)  Benzonatate 100 Mg Caps (Benzonatate) .... Take one capsule three times a day for 10 days.   Patient Instructions: 1)  Please schedule a follow-up appointment in 3 months for blood pressure check. 2)  You had labs collected today.  I will call you if there is anything that needs to be addressed. 3)  You have a new cream for your itching.  Apply it three times a day. Symptoms should improve in a few days. 4)  You should apply warm compresses three times a day to your right armpit.  Come back to clinic if you develope a large lump, or start to have fevers and feel sick.    Prescriptions: CADUET  5-80 MG  TABS (AMLODIPINE-ATORVASTATIN) Take 1 tablet by mouth once a day  #30 x 6   Entered and Authorized by:   Elby Showers MD   Signed by:   Elby Showers MD on 11/28/2007   Method used:   Electron[facility] sent to ...       [facility] [address] [phone]*       [address]       [place], [place]  [address]       Ph: [phone] or [phone]       Fax: [phone]   RxID:   [id number] PLAVIX 75 MG TABS (CLOPIDOGREL BISULFATE) Take 1 tablet by mouth once a day  #30 x 6   Entered and Authorized by:   Elby Showers MD   Signed by:   Elby Showers MD on 11/28/2007   Method used:   Electronically sent to ...       [facility] [address] [phone]*       [address]       [place], [place]  [address]       Ph: [phone] or [phone]       Fax: [phone]   RxID:   [id number] BENZONATATE 100 MG  CAPS (BENZONATATE) Take one capsule three times a day for 10 days.  #30 x 0   Entered and Authorized by:   Elby Showers MD   Signed by:   Elby Showers MD on 11/28/2007  Method used:   Electronically sent to ...       [facility] [address] [phone]*       [address]       [place], [place]  [address]       Ph: [phone] or [phone]       Fax: [phone]   RxID:   [id number] NYSTATIN 100000 UNIT/GM  CREA (NYSTATIN) Apply three times a day to affected area.  #30g x 0   Entered and Authorized by:   Elby Showers MD   Signed by:   Elby Showers MD on 11/28/2007   Method used:   Electronically sent to ...       [facility] [address] [phone]*       [address]       [place], [place]  [address]       Ph: [phone] or [phone]       Fax: [phone]   RxID:   [id number]  ] Laboratory Results   Urine Tests  Date/Time Recieved: 02/29/09 1700pm. Date/Time Reported: 02/29/09 1700 p.m.[name]  Routine Urinalysis   Glucose:  negative   (Normal Range: Negative) Bilirubin: negative   (Normal Range: Negative) Ketone: negative   (Normal Range: Negative) Spec. Gravity: 1.015   (Normal Range: 1.003-1.035) Blood: trace-intact   (Normal Range: Negative) pH: 5.0   (Normal Range: 5.0-8.0) Protein: trace   (Normal Range: Negative) Urobilinogen: 0.2   (Normal Range: 0-1) Nitrite: negative   (Normal Range: Negative) Leukocyte Esterace: large   (Normal Range: Negative)

## 2010-12-01 NOTE — Progress Notes (Signed)
  Phone Note Call from Patient   Caller: Patient Reason for Call: Refill Medication Action Taken: Phone Call Completed, Rx Called In Summary of Call: She could not get lasix over weekend [facility] and [phone]. I did this and [facility]. Initial call taken by: Jackson Latino MD,  Mar 12, 2010 2:52 AM

## 2010-12-01 NOTE — Consult Note (Signed)
Summary: [facility]   [facility]   Imported By: Margie Billet 08/04/2010 11:12:56  _____________________________________________________________________  External Attachment:    Type:   Image     Comment:   External Document

## 2010-12-01 NOTE — Progress Notes (Signed)
Summary: Refill/gh  Medications Added FLUCONAZOLE 150 MG  TABS (FLUCONAZOLE) Take 1 by mouth x 1 dose            New/Updated Medications: FLUCONAZOLE 150 MG  TABS (FLUCONAZOLE) Take 1 by mouth x 1 dose   Prescriptions: FLUCONAZOLE 150 MG  TABS (FLUCONAZOLE) Take 1 by mouth x 1 dose  #1 x 0   Entered by:   Angelina Ok RN   Authorized by:   [name] MD   Signed by:   Angelina Ok RN on 10/18/2007   Method used:   Historical   RxID:   [id number]

## 2010-12-01 NOTE — Miscellaneous (Signed)
Summary: moterized scooter request denial.  Have received a request for moterized scooter from [employer] for [name].  She does have some mobility limitations, but seems to do well with a walker.  I will deny this request as I do not think it would be appropriate.  I will make an appointment for her to discuss whether there are other devices that could be of use.  If she is insistant I will be happy to have a formal PT evaluation for appropriate equiptment needs.  Clinical Lists Changes

## 2010-12-01 NOTE — Progress Notes (Signed)
Summary: phone/gg  Phone Note Call from Patient   Caller: Patient Summary of Call: Pt states she was sent to have sleep apnea eval. she has tried but  she is not able to tolerate the mask. She is c/o allergy symptoms and would like to be put on inhaler like she had several years ago.  She feels allergies are her problem not sleep apnea. Will see Friday to evaluate Initial call taken by: Merrie Roof RN,  Mar 09, 2010 3:16 PM

## 2010-12-01 NOTE — Assessment & Plan Note (Signed)
Summary: lesions on arms/gg   Vital Signs:  Patient Profile:   70 Years Old Female Height:     65 inches (165.10 cm) Weight:      230.5 pounds (104.77 kg) BMI:     38.50 Temp:     98.3 degrees F (36.83 degrees C) oral Pulse rate:   86 / minute BP sitting:   126 / 74  (left arm)  Pt. in pain?   yes    Location:   sores over body    Intensity:   8-9  Vitals Entered By: Stanton Kidney Ditzler RN (February 14, 2008 3:13 PM)              Is Patient Diabetic? No Nutritional Status BMI of > 30 = obese Nutritional Status Detail down  Have you ever been in a relationship where you felt threatened, hurt or afraid?denies   Does patient need assistance? Functional Status Self care Ambulation Impaired:Risk for fall Comments Has a walker and cane.     PCP:  [name] MD  Chief Complaint:  Past week sorea on legs, arms, and chest and back. Sore to touch..  History of Present Illness: PT is a 70 year old woman with multiple medical problems as outlined in EMR presenting to clinic secondary to one week history of multiple painful lesion on her bilateral forearms, bilateral lower extremities, abdomen, and back with sparing of the palms and soles.  Pt denies tick exposure.  Pt reports a similar episode in the past (approximately 6 months ago).  Pt reports lesions are painful especially on her arms and occas drain yellow pus like material.  Pt has no other complaints currently but mentions that she is supposed to see the neurosurgeon on the 29th of this month.     Prior Medications Reviewed Using: Medication Bottles  Current Allergies (reviewed today): ! SEPTRA ! ASA  Past Medical History:    Cervical cancer, hx of (ovarian cancer 1972 oophorectomy)    GERD    Gout    Hypertension    Low back pain,  Sec to DDD and DJD    Osteoarthritis - both hips    Transient ischemic attack, hx of    Sensorineural hearing loss ( bilateral w/chronic L ear pain)    CPAP    Mitral valve prolapse  Chronic venous insufficiency    Diverticular Disease    Hyperlipidemia    Boils    Gout    Risk Factors: Tobacco use:  quit    Year quit:  1999 Alcohol use:  no Exercise:  no Seatbelt use:  100 %  Mammogram History:    Date of Last Mammogram:  12/18/2006   Review of Systems       SEE  HPI   Physical Exam  General:     alert, well-developed, well-nourished, well-hydrated, and overweight-appearing.   Head:     normocephalic and atraumatic.   Neck:     supple.   Lungs:     normal respiratory effort, no intercostal retractions, no accessory muscle use, normal breath sounds, no crackles, and no wheezes.   Heart:     normal rate, regular rhythm, and no murmur.   Abdomen:     soft, non-tender, normal bowel sounds, no distention, and no masses.  Obese Extremities:     No clubbing, cyanosis, edema, or deformity noted with normal full range of motion of all joints.   Neurologic:     alert & oriented X3.   Skin:  Pt with multiple raised, erythematous lesion located on her bilateral forearms, bilateral lower extremities, back, and abdomen.  These lesions are approximately 1-1/1/2 inches in diameter, red, some draining small amount of yellowish discharge.  These appear to be boils.  Psych:     Oriented X3, normally interactive, good eye contact, not anxious appearing, and not depressed appearing.  Talkative but very friendly    Impression & Recommendations:  Problem # 1:  BOILS, RECURRENT (ICD-680.9) Given past history of similar episodes I will give an extended course of doxycycline given the positive history of MRSA.  I will treat for 21 days.  Pt is to take 100mg  PO BID.  PT to follow up in three weeks to see if they have resolved.  Pt to call clinic if she has problems with the Anitibiotic or if the lesions get worse.  I will also give her a diflucan because she reports yeast infections with antibiotic usage.  i  Complete Medication List: 1)  Amitriptyline Hcl 50 Mg  Tabs (Amitriptyline hcl) .... Take 1 tablet by mouth at bedtime 2)  Allopurinol 300 Mg Tabs (Allopurinol) .... Take 1 tablet by mouth once a day 3)  Klor-con 10 10 Meq Tbcr (Potassium chloride) .... Take 1 tablet by mouth two times a day 4)  Plavix 75 Mg Tabs (Clopidogrel bisulfate) .... Take 1 tablet by mouth once a day 5)  Lisinopril 40 Mg Tabs (Lisinopril) .... Take 1 tablet by mouth once a day 6)  Protonix 40 Mg Tbec (Pantoprazole sodium) .... Take 1 tablet by mouth once a day 7)  Vicodin 5-500 Mg Tabs (Hydrocodone-acetaminophen) .... Take 1 tab every 4-6 hrs as needed 8)  Detrol La 4 Mg Cp24 (Tolterodine tartrate) .... Take 1 tablet by mouth once at bedtime 9)  Lactulose Soln (Lactulose soln) .... Take 30ml till yo have bm, repeat 4 times in a day. 10)  Wellbutrin Sr 150 Mg Tb12 (Bupropion hcl) .... Take one pill every day for three days, then start taking one pill twice daily. 11)  Tylenol 325 Mg Tabs (Acetaminophen) .... Take 2 tablet by mouth four times a day for pain 12)  Caduet 5-80 Mg Tabs (Amlodipine-atorvastatin) .... Take 1 tablet by mouth once a day 13)  Hydrochlorothiazide 25 Mg Tabs (Hydrochlorothiazide) .... Take 1 tablet by mouth once a day 14)  Piroxicam 20 Mg Caps (Piroxicam) .... Take 1 tablet by mouth once a day 15)  Benzonatate 100 Mg Caps (Benzonatate) .... Take one capsule three times a day for 10 days. 16)  Metronidazole 500 Mg Tabs (Metronidazole) .... Take one tablet two times a day for 7 days. 17)  Premarin 0.625 Mg/gm Crea (Estrogens, conjugated) .[name].. 1g application intravaginally and externally daily 18)  Fluocinonide 0.05 % Oint (Fluocinonide) .... Use as directed 19)  Piroxicam 20 Mg Caps (Piroxicam) .... Take 1 tablet by mouth once a day 20)  Doxycycline Hyclate 100 Mg Tabs (Doxycycline hyclate) .... Take 1 tablet by mouth two times a day for 21 days 21)  Fluconazole 150 Mg Tabs (Fluconazole) .... Take one pill as needed for signs of yeast infection   Patient  Instructions: 1)  Please schedule a follow-up appointment in 3 weeks to make sure your skin irritation is better. 2)  Make sure you take the antibiotics for the entire 21 days.   3)  Call if you have any problems 4)  You may take a warm bath or use a hot wash cloth to apply to the boils.  5)  I have sent your refills for your allopurinol, piroxicam, diflucan, and the doxycycline to the pharmacy    Prescriptions: ALLOPURINOL 300 MG TABS (ALLOPURINOL) Take 1 tablet by mouth once a day  #30 x 2   Entered and Authorized by:   Rufina Falco MD   Signed by:   Rufina Falco MD on 02/14/2008   Method used:   Electronically sent to ...       [facility] [address] [phone]*       [address]       [place], [place]  [address]       Ph: [phone] or [phone]       Fax: [phone]   RxID:   [id number] FLUCONAZOLE 150 MG  TABS (FLUCONAZOLE) Take one pill as needed for signs of yeast infection  #1 x 1   Entered and Authorized by:   Rufina Falco MD   Signed by:   Rufina Falco MD on 02/14/2008   Method used:   Electronically sent to ...       [facility] [address] [phone]*       [address]       [place], [place]  [address]       Ph: [phone] or [phone]       Fax: [phone]   RxID:   [id number] DOXYCYCLINE HYCLATE 100 MG  TABS (DOXYCYCLINE HYCLATE) Take 1 tablet by mouth two times a day for 21 days  #42 x 0   Entered and Authorized by:   Rufina Falco MD   Signed by:   Rufina Falco MD on 02/14/2008   Method used:   Electronically sent to ...       [facility] [address] [phone]*       [address]       [place], [place]  [address]       Ph: [phone] or [phone]       Fax: [phone]   RxID:   [id number] PIROXICAM 20 MG  CAPS (PIROXICAM) Take 1 tablet by mouth once a day  #30 x 3   Entered and Authorized by:   Rufina Falco MD   Signed by:   Rufina Falco MD on 02/14/2008    Method used:   Electronically sent to ...       [facility] [address] [phone]*       [address]       [place], [place]  [address]       Ph: [phone] or [phone]       Fax: [phone]   RxID:   [id number]  ]

## 2010-12-01 NOTE — Progress Notes (Signed)
Summary: refill/gg  Phone Note Refill Request  on February 11, 2009 9:46 AM  request med for vaginal dryness   Method Requested: Electronic Initial call taken by: Merrie Roof RN,  February 11, 2009 9:48 AM  Follow-up for Phone Call        see in the chart that she had been on premarin cream for vaginal dryness in the past.  tried to call to discuss symptoms.  no answer.  left a message.

## 2010-12-01 NOTE — Progress Notes (Signed)
Summary: med refillgp  Phone Note Refill Request Message from:  Fax from Pharmacy on July 25, 2010 3:31 PM  Refills Requested: Medication #1:  PREMARIN 0.625 MG/GM CREA Apply intravaginally and externally as directed Last appt.7/28;  next appt. 10/28.   Method Requested: Telephone to Pharmacy Initial call taken by: Chinita Pester RN,  July 25, 2010 3:30 PM  Follow-up for Phone Call        Refill approved-nurse to complete. Follow-up by: Margarito Liner MD,  July 25, 2010 4:17 PM    Prescriptions: PREMARIN 0.625 MG/GM CREA (ESTROGENS, CONJUGATED) Apply intravaginally and externally as directed  #1 tube x 0   Entered and Authorized by:   Margarito Liner MD   Signed by:   Margarito Liner MD on 07/25/2010   Method used:   Telephoned to ...       [facility])       [address]       [place], [place]  [address]       Ph: [phone]       Fax: [phone]   RxID:   [id number]   Appended Document: med refillgp Above Rx refill request faxed to [facility].

## 2010-12-01 NOTE — Progress Notes (Signed)
Summary: phone/gg  Phone Note Call from Patient   Caller: Patient Summary of Call: Pt called and c/o generalized pain, mostly legs and hips.  Onset 3 days.    She took advil and tylenol without relief.   She is asking for Piroxicam but I don't see it on the med list.  She has scheduled appointment Oct 28th.  Can she have something to last until then. Pt # [id number] Initial call taken by: Merrie Roof RN,  July 25, 2010 2:44 PM  Follow-up for Phone Call        I would advise that she be seen this week.   Follow-up by: Margarito Liner MD,  July 25, 2010 4:15 PM  Additional Follow-up for Phone Call Additional follow up Details #1::        We have no appointments this week. Merrie Roof RN  July 25, 2010 5:26 PM     Additional Follow-up for Phone Call Additional follow up Details #2::    I would advise urgent care. Follow-up by: Margarito Liner MD,  July 25, 2010 5:33 PM  Additional Follow-up for Phone Call Additional follow up Details #3:: Details for Additional Follow-up Action Taken: pt called, no answer. Message left Additional Follow-up by: Merrie Roof RN,  July 25, 2010 5:46 PM

## 2010-12-01 NOTE — Assessment & Plan Note (Signed)
Summary: cough,congestion,?fever,h/a,general body achesx 1wk/pcp-walsh...   Vital Signs:  Patient profile:   70 year old female Height:      65 inches Weight:      239.5 pounds BMI:     40.00 O2 Sat:      98 % Temp:     98.3 degrees F oral Pulse rate:   69 / minute BP sitting:   117 / 65  (right arm)  Vitals Entered By: Filomena Jungling NT II (December 09, 2009 1:37 PM) CC: ACHING, FEVER, CONJESTION, LEGS CRAMP, Depression, COUGH Is Patient Diabetic? No Pain Assessment Patient in pain? yes     Location: ACHING Intensity: 10 Type: aching Nutritional Status BMI of > 30 = obese  Does patient need assistance? Functional Status Self care Ambulation Normal   Primary Care Provider:  Elby Showers MD  CC:  ACHING, FEVER, CONJESTION, LEGS CRAMP, Depression, and COUGH.  History of Present Illness: 15 y/ woman with PMH of chronic b/l hip pain s/p b/l knee replacements by Dr. Ophelia Charter comes to the clinic complainig of pain in both hips. She is having this pain since yrs. She was seen for the same problem in dec 2010. At that time, PT and wt loss were reccmended to her. PAtientsays she has not been walking more than a little bit mobilityin her house. she cannot go out of her house  much [place]. She had tried PT in the past which heled her pain a somewhat. She is also complainig of having malaise, subjective fever, and dry cough since last 2-3days.    Depression History:      The patient denies a depressed mood most of the day and a diminished interest in her usual daily activities.         Preventive Screening-Counseling & Management  Alcohol-Tobacco     Alcohol drinks/day: 0     Smoking Status: quit     Year Quit: 1999     Pack years: 50     Passive Smoke Exposure: yes  Caffeine-Diet-Exercise     Does Patient Exercise: no  Current Medications (verified): 1)  Protonix 40 Mg Pack (Pantoprazole Sodium) .... Take One Tablet Daily. 2)  Detrol La 4 Mg  Cp24 (Tolterodine Tartrate) .... Take 1 Tablet By Mouth Once At Bedtime 3)  Amlodipine Besylate 5 Mg Tabs (Amlodipine Besylate) .... Take One Tablet Daily. 4)  Clotrimazole Anti-Fungal 1 %  Crea (Clotrimazole) .... Apply To The Area 3-4 Times Per Day 5)  Tramadol Hcl 50 Mg Tabs (Tramadol Hcl) .... Take One Tablet Every 6 Hrs As Needed For Pain. 6)  Allopurinol 300 Mg Tabs (Allopurinol) .[name].. 1 Tablet Once A Day 7)  Plavix 75 Mg Tabs (Clopidogrel Bisulfate) .[name].. 1 Tablet Daily. 8)  Lisinopril 40 Mg Tabs (Lisinopril) .[name].. 1 Tablet Once A Day. 9)  Biotin 5000 5 Mg Caps (Biotin) .... Take One Tablet Daily. 10)  Apple Cider Vinegar 300 Mg Tabs (Apple Cider Vinegar) .... One Daily 11)  Calcium 500/d 500-200 Mg-Unit Tabs (Calcium Carbonate-Vitamin D) .... One Daily 12)  Vitamin C-Acerola 500 Mg Tabs (Ascorbic Acid) .... One Daily 13)  Lipitor 80 Mg Tabs (Atorvastatin Calcium) .... Take One Tablet Daily. 14)  Polyethylene Glycol 1000  Powd (Polyethylene Glycol 1000) .... Use 17g in Liquid Daily For Constipation. 15)  Hydrochlorothiazide 25 Mg Tabs (Hydrochlorothiazide) .... Take One Tablet Daily. 16)  Fluocinonide 0.05 % Crea (Fluocinonide) .... Apply To Affected Areas Two Times A Day. 17)  Lidoderm 5 % Ptch (Lidocaine) .... Apply To Painful Area Once A Day. 18)  Premarin 0.625 Mg/gm Crea (Estrogens, Conjugated) .... Apply Intravaginally and Externally As Directed 19)  Nystop 100000 Unit/gm Powd (Nystatin) .... Apply To Affected Area Twice A Day. 20)  Potassium Chloride Cr 10 Meq Cr-Caps (Potassium Chloride) .... Take 1 Tablet By Mouth Two Times A Day 21)  Loratadine 10 Mg Tabs (Loratadine) .... Take One Tablet Daily For Sinuses.  Allergies (verified): 1)  ! Septra 2)  ! Asa  Physical Exam  General:  alert and overweight-appearing.  Walks with walker.  Head:  normocephalic and atraumatic.   Eyes:  vision grossly intact, pupils equal, pupils round, pupils reactive to light, and pupils react to  accomodation.   Ears:  R ear normal and L ear normal.   Neck:  supple, full ROM, no masses, and no cervical lymphadenopathy.   Lungs:  normal respiratory effort, no intercostal retractions, no accessory muscle use, normal breath sounds, and no wheezes.   Heart:  normal rate, regular rhythm, no murmur, and no gallop.   Abdomen:  soft, non-tender, normal bowel sounds, and no distention.   Msk:  pain with any movement of either hip.  pain with palpation of both hips.  strength 5/5 both legs. DTRs in tact. Pulses:  dorsalis pedis pulses normal bilaterally  Extremities:  no edema Neurologic:  OrientedX3, cranial nerver 2-12 intact,strength good in all extremities, sensations normal to light touch, reflexes 2+ b/l, gait normal    Impression & Recommendations:  Problem # 1:  HIP PAIN, RIGHT, CHRONIC (ICD-719.45)  it seems this is related to arthritis and does not require repeat imaging. She says that Dr. Ophelia Charter has evaluated her pain post surgery and he does not think this pain is something related to surgical complication.  Will try to get records from Dr. Ophelia Charter. Until then, continue what treatment she is on. Will increase her tramadol to Q6 hrs and refer to PT again as that helped her in the past.  Her updated medication list for this problem includes:    Tramadol Hcl 50 Mg Tabs (Tramadol hcl) .[name]... Take one tablet every 6 hrs as needed for pain.  Discussed use of medications, application of heat or cold, and exercises.   Problem # 2:  COUGH (ICD-786.2) She does seem to have some mild upper respiratory infection most probably viral inf. Will advise symptomatic treatment. Hopefully she will better overall once the flu resolves.   Problem # 3:  HYPOTHYROIDISM NOS (ICD-244.9) No symtoms. TSS checked before was normal   Problem # 4:  GOUT (ICD-274.9) No recent flare ups. continue with allopurinol  Her updated medication list for this problem includes:    Allopurinol 300 Mg Tabs (Allopurinol)  .[name]... 1 tablet once a day  Elevate extremity; warm compresses, symptomatic relief and medication as directed.   Problem # 5:  HYPERLIPIDEMIA (ICD-272.4) Will need Lipid profile. She had a heavy lunch before she came to the clinic. Will ask her to come fasting for FLP  Her updated medication list for this problem includes:    Lipitor 80 Mg Tabs (Atorvastatin calcium) .[name]... Take one tablet daily.  Labs Reviewed: SGOT: 14 (05/14/2009)   SGPT: 13 (05/14/2009)  Prior 10 Yr Risk Heart Disease: 15 % (04/29/2008)   HDL:43 (12/15/2008), 46 (04/29/2008)  LDL:92 (12/15/2008), 127 (16/07/9603)  Chol:177 (12/15/2008), 205 (04/29/2008)  Trig:208 (12/15/2008), 158 (04/29/2008)  Problem # 6:  HYPERTENSION (ICD-401.9) Controlled.   Her updated medication list for  this problem includes:    Amlodipine Besylate 5 Mg Tabs (Amlodipine besylate) .[name]... Take one tablet daily.    Lisinopril 40 Mg Tabs (Lisinopril) .[name]... 1 tablet once a day.    Hydrochlorothiazide 25 Mg Tabs (Hydrochlorothiazide) .[name]... Take one tablet daily.  BP today: 117/65 Prior BP: 159/88 (10/14/2009)  Prior 10 Yr Risk Heart Disease: 15 % (04/29/2008)  Labs Reviewed: K+: 4.6 (05/14/2009) Creat: : 1.24 (05/14/2009)   Chol: 177 (12/15/2008)   HDL: 43 (12/15/2008)   LDL: 92 (12/15/2008)   TG: 208 (12/15/2008)  Complete Medication List: 1)  Protonix 40 Mg Pack (Pantoprazole sodium) .... Take one tablet daily. 2)  Detrol La 4 Mg Cp24 (Tolterodine tartrate) .... Take 1 tablet by mouth once at bedtime 3)  Amlodipine Besylate 5 Mg Tabs (Amlodipine besylate) .... Take one tablet daily. 4)  Clotrimazole Anti-fungal 1 % Crea (Clotrimazole) .... Apply to the area 3-4 times per day 5)  Tramadol Hcl 50 Mg Tabs (Tramadol hcl) .... Take one tablet every 6 hrs as needed for pain. 6)  Allopurinol 300 Mg Tabs (Allopurinol) .[name].. 1 tablet once a day 7)  Plavix 75 Mg Tabs (Clopidogrel bisulfate) .[name].. 1 tablet daily. 8)  Lisinopril 40 Mg Tabs (Lisinopril)  .[name].. 1 tablet once a day. 9)  Biotin 5000 5 Mg Caps (Biotin) .... Take one tablet daily. 10)  Apple Cider Vinegar 300 Mg Tabs (Apple cider vinegar) .... One daily 11)  Calcium 500/d 500-200 Mg-unit Tabs (Calcium carbonate-vitamin d) .... One daily 12)  Vitamin C-acerola 500 Mg Tabs (Ascorbic acid) .... One daily 13)  Lipitor 80 Mg Tabs (Atorvastatin calcium) .... Take one tablet daily. 14)  Polyethylene Glycol 1000 Powd (Polyethylene glycol 1000) .... Use 17g in liquid daily for constipation. 15)  Hydrochlorothiazide 25 Mg Tabs (Hydrochlorothiazide) .... Take one tablet daily. 16)  Fluocinonide 0.05 % Crea (Fluocinonide) .... Apply to affected areas two times a day. 17)  Lidoderm 5 % Ptch (Lidocaine) .... Apply to painful area once a day. 18)  Premarin 0.625 Mg/gm Crea (Estrogens, conjugated) .... Apply intravaginally and externally as directed 19)  Nystop 100000 Unit/gm Powd (Nystatin) .... Apply to affected area twice a day. 20)  Potassium Chloride Cr 10 Meq Cr-caps (Potassium chloride) .... Take 1 tablet by mouth two times a day 21)  Loratadine 10 Mg Tabs (Loratadine) .... Take one tablet daily for sinuses.  Other Orders: T-Hemoccult Card-Multiple (take home) ([address])  Patient Instructions: 1)  Please schedule a follow-up appointment in 1 month. 2)  You need to lose weight. Consider a lower calorie diet and regular exercise.  3)  Complete your Hemocult cards and return them soon. 4)  Get plenty of rest, drink lots of clear liquids, and use Tylenol or Ibuprofen for fever and comfort. Return in 7-10 days if you're not better:sooner if you're feeling worse. 5)  Take 650-1000mg  of Tylenol every 4-6 hours as needed for relief of pain or comfort of fever AVOID taking more than 4000mg   in a 24 hour period (can cause liver damage in higher doses). 6)  Take 400-600mg  of Ibuprofen (Advil, Motrin) with food every 4-6 hours as needed for relief of pain or comfort of fever.  Prevention & Chronic  Care Immunizations   Influenza vaccine: Fluvax MCR  (07/26/2009)   Influenza vaccine due: 08/08/2008    Tetanus booster: 05/07/2009: Td    Pneumococcal vaccine: Not documented   Pneumococcal vaccine deferral: Deferred  (12/09/2009)    H. zoster vaccine: Not documented   H.  zoster vaccine deferral: Deferred  (12/09/2009)  Colorectal Screening   Hemoccult: Not documented   Hemoccult action/deferral: Ordered  (12/09/2009)    Colonoscopy: Not documented   Colonoscopy action/deferral: Deferred  (12/09/2009)  Other Screening   Pap smear: Not documented   Pap smear action/deferral: Not indicated S/P hysterectomy  (05/14/2009)    Mammogram: ASSESSMENT: Negative - BI-RADS 1^MM DIGITAL SCREENING  (12/01/2009)   Mammogram due: 12/22/2010    DXA bone density scan: Not documented   DXA bone density action/deferral: Deferred  (12/09/2009)   Smoking status: quit  (12/09/2009)  Lipids   Total Cholesterol: 177  (12/15/2008)   LDL: 92  (12/15/2008)   LDL Direct: Not documented   HDL: 43  (12/15/2008)   Triglycerides: 208  (12/15/2008)    SGOT (AST): 14  (05/14/2009)   SGPT (ALT): 13  (05/14/2009)   Alkaline phosphatase: 97  (05/14/2009)   Total bilirubin: 0.5  (05/14/2009)    Lipid flowsheet reviewed?: Yes   Progress toward LDL goal: At goal  Hypertension   Last Blood Pressure: 117 / 65  (12/09/2009)   Serum creatinine: 1.24  (05/14/2009)   Serum potassium 4.6  (05/14/2009)    Hypertension flowsheet reviewed?: Yes   Progress toward BP goal: At goal  Self-Management Support :   Personal Goals (by the next clinic visit) :      Personal blood pressure goal: 140/90  (12/09/2009)     Personal LDL goal: 130  (12/09/2009)    Patient will work on the following items until the next clinic visit to reach self-care goals:     Medications and monitoring: take my medicines every day  (12/09/2009)     Eating: eat more vegetables  (12/09/2009)    Hypertension self-management support:  Written self-care plan  (12/09/2009)   Hypertension self-care plan printed.    Lipid self-management support: Written self-care plan  (10/14/2009)    Nursing Instructions: Provide Hemoccult cards with instructions (see order)

## 2010-12-01 NOTE — Progress Notes (Signed)
Summary: Results and shower chair  Phone Note Call from Patient   Caller: Patient Call For: Elby Showers MD Summary of Call: Pt. needs an order for a shower chair to be sent to [facility] if possible.   Pt needs Dr. Clent Ridges to call her about her results and the swelling in her legs.  Pt is not using the Compression hose as ordered.  Would like a call from the doctor if possible. Initial call taken by: Angelina Ok RN,  June 29, 2008 3:43 PM  Follow-up for Phone Call        Her labs are fine.  I wrote for the lasix that she and Dr. Sunnie Nielsen discussed, but from the note it seems she already had some at home.  If she takes the 4 days of lasix as planned and still has leg swelling she should make another appointment in clinic.    New/Updated Medications: FUROSEMIDE 40 MG TABS (FUROSEMIDE) Take one tablet daily for 4 days for leg swelling.   Prescriptions: FUROSEMIDE 40 MG TABS (FUROSEMIDE) Take one tablet daily for 4 days for leg swelling.  #4 x 0   Entered and Authorized by:   Elby Showers MD   Signed by:   Elby Showers MD on 07/01/2008   Method used:   [facility]ron[facility] to        [facility] [address] [phone]* (retail)       [address]       [place], [place]  [address]       Ph: [phone] or [phone]       Fax: [phone]   RxID:   [id number]

## 2010-12-01 NOTE — Progress Notes (Signed)
Summary: diflucan/hla  Phone Note Refill Request Message from:  Patient on July 28, 2010 9:55 AM  Refills Requested: Medication #1:  diflucan pt states she has vaginal yeast   Initial call taken by: Marin Roberts RN,  July 28, 2010 9:57 AM  Follow-up for Phone Call        Will treat with fluconazole 150 mg one dose only.  Please have patient make an appointment if her symptoms persist. Follow-up by: Margarito Liner MD,  July 29, 2010 12:06 PM  Additional Follow-up for Phone Call Additional follow up Details #1::        Agree with above plan. Additional Follow-up by: Leodis Sias MD,  July 29, 2010 3:47 PM    New/Updated Medications: FLUCONAZOLE 150 MG TABS (FLUCONAZOLE) Take 1 tablet by mouth one dose only.  See a physician if your symptoms persist. Prescriptions: FLUCONAZOLE 150 MG TABS (FLUCONAZOLE) Take 1 tablet by mouth one dose only.  See a physician if your symptoms persist.  #1 x 0   Entered and Authorized by:   Margarito Liner MD   Signed by:   Margarito Liner MD on 07/29/2010   Method used:   [facility]        [facility] [address] [phone]* (retail)       [address]       [place], [place]  [address]       Ph: [phone] or [phone]       Fax: [phone]   RxID:   [id number]

## 2010-12-01 NOTE — Progress Notes (Signed)
Summary: page 1 refills/ hla  Phone Note Refill Request Message from:  Fax from Pharmacy on September 27, 2009 3:29 PM  Refills Requested: Medication #1:  ALLOPURINOL 300 MG TABS 1 tablet once a day  Medication #2:  AMLODIPINE BESYLATE 5 MG TABS Take one tablet daily.  Medication #3:  DETROL LA 4 MG CP24 Take 1 tablet by mouth once at bedtime  Medication #4:  HYDROCHLOROTHIAZIDE 25 MG TABS Take one tablet daily. pt [facility] to [facility]  Initial call taken by: Marin Roberts RN,  September 27, 2009 3:30 PM  Follow-up for Phone Call        I signed all 3 pages of refills as phone in orders.  Please call in, or print and fax... which ever is easier.  Thanks.    Prescriptions: DETROL LA 4 MG CP24 (TOLTERODINE TARTRATE) Take 1 tablet by mouth once at bedtime  #30 x 11   Entered and Authorized by:   Elby Showers MD   Signed by:   Elby Showers MD on 09/28/2009   Method used:   Telephoned to ...       [facility] (retail)       [address]       [place], [place]  [address]       Ph: [phone]       Fax: [phone]   RxID:   [id number] ALLOPURINOL 300 MG TABS (ALLOPURINOL) 1 tablet once a day  #32 x 6   Entered and Authorized by:   Elby Showers MD   Signed by:   Elby Showers MD on 09/28/2009   Method used:   Telephoned to ...       [facility] (retail)       [address]       [place], [place]  [address]       Ph: [phone]       Fax: [phone]   RxID:   [id number] HYDROCHLOROTHIAZIDE 25 MG TABS (HYDROCHLOROTHIAZIDE) Take one tablet daily.  #32 x 11   Entered and Authorized by:   Elby Showers MD   Signed by:   Elby Showers MD on 09/28/2009   Method used:   Telephoned to ...       [facility] (retail)       [address]       [place], [place]  [address]       Ph: [phone]       Fax: [phone]   RxID:   [id number] AMLODIPINE BESYLATE 5 MG TABS (AMLODIPINE BESYLATE) Take one tablet daily.  #30 x  11   Entered and Authorized by:   Elby Showers MD   Signed by:   Elby Showers MD on 09/28/2009   Method used:   Telephoned to ...       [facility] (retail)       [address]       [place], [place]  [address]       Ph: [phone]       Fax: [phone]   RxID:   [id number]

## 2010-12-01 NOTE — Progress Notes (Signed)
Summary: PHONE/GG  Phone Note Call from Patient   Summary of Call: Reason for Call: Talk to Nurse Summary of Call: Pt had hand injury  2 weeks ago.  she stuck a long old rusted spike in center of palm,  area is now better, no pain but she is wondering about a tetanus shot.  She thinks she had one 5 years ago when she had a hip replacement.  Dr Ophelia Charter did hip replacement. I called office, last visit was  6/07.  I will pull her hospital record and see if tetanus given. please advise Pt # [id number] Initial call taken by: Merrie Roof RN,  April 29, 2009 3:18 PM  Follow-up for Phone Call        we do not have any tetnus vaccinations recorded for [name] in the electronic or paper chart.  she should come in for a booster as there does not seem to be one on record anywhere. I will put in the order.  Additional Follow-up for Phone Call Additional follow up Details #1::        Pt scheduled for 7/9 Additional Follow-up by: Merrie Roof RN,  April 30, 2009 4:19 PM  New Problems: PREVENTIVE HEALTH CARE (ICD-V70.0)   New Problems: PREVENTIVE HEALTH CARE (ICD-V70.0)

## 2010-12-01 NOTE — Progress Notes (Signed)
Summary: phone/gg  Phone Note Call from Patient   Caller: Patient Summary of Call: Pt called to report that she started taking cipro for bladder infection.  She took Friday through today.  She has been having terrible chest pain, worst pain she ever had.  She still has pain but not as bad.  Pain was in chest and  Left shoulder to arm.  Very tight feeling.  Denies nausea, SOB.  She has had diaphoresis past few days.   She has had a  warn out feeling. BP at home 142/87 Pt # [phone] Initial call taken by: Merrie Roof RN,  November 07, 2010 2:32 PM  Follow-up for Phone Call        I talked with Dr Odis Luster and informed her of above. She recommend pt go to ED for evaluation.  cipro should not cause these signs. Pt called and informed to go to ED now for evaluation. Patient/caller verbalizes understanding of these instructions.  Follow-up by: Merrie Roof RN,  November 07, 2010 3:10 PM

## 2010-12-01 NOTE — Progress Notes (Signed)
  Phone Note Refill Request Message from:  Fax from Pharmacy on July 11, 2010 5:01 PM  Refills Requested: Medication #1:  ALLOPURINOL 300 MG TABS 1 tablet once a day   Dosage confirmed as above?Dosage Confirmed last visit 7/28, last labs  Initial call taken by: Marin Roberts RN,  July 11, 2010 5:01 PM    Prescriptions: ALLOPURINOL 300 MG TABS (ALLOPURINOL) 1 tablet once a day  #32 x 6   Entered and Authorized by:   Zoila Shutter MD   Signed by:   Zoila Shutter MD on 07/11/2010   Method used:   Faxed to ...       [facility] (retail)       [address]       [place], [place]  [address]       Ph: [phone]       Fax: [phone]   RxID:   [id number] ALLOPURINOL 300 MG TABS (ALLOPURINOL) 1 tablet once a day  #32 x 6   Entered and Authorized by:   Zoila Shutter MD   Signed by:   Zoila Shutter MD on 07/11/2010   Method used:   Faxed to ...       [facility] (retail)       [address]       [place], [place]  [address]       Ph: [phone]       Fax: [phone]   RxID:   [id number]

## 2010-12-01 NOTE — Progress Notes (Signed)
Summary: refill/ hla  Phone Note Refill Request Message from:  Patient on October 05, 2008 11:17 AM  Refills Requested: Medication #1:  KLOR-CON 10 10 MEQ CR-TABS 1 tablet twice a day. Initial call taken by: Marin Roberts RN,  October 05, 2008 11:18 AM      Prescriptions: KLOR-CON 10 10 MEQ CR-TABS (POTASSIUM CHLORIDE) 1 tablet twice a day.  #32 x 6   Entered and Authorized by:   Elby Showers MD   Signed by:   Elby Showers MD on 10/05/2008   Method used:   [facility]        [facility] [address] [phone]* (retail)       [address]       [place], [place]  [address]       Ph: [phone] or [phone]       Fax: [phone]   RxID:   [id number]   Appended Document: refill/ hla did you want her to take 1 or 2 a day?

## 2010-12-01 NOTE — Progress Notes (Signed)
Summary: REfill/gh  Phone Note Refill Request Message from:  Fax from Pharmacy on October 26, 2009 5:29 PM  Refills Requested: Medication #1:  NYSTOP 100000 UNIT/GM POWD Apply to affected area twice a day.  Method Requested: Electronic Initial call taken by: Angelina Ok RN,  October 26, 2009 5:29 PM  Follow-up for Phone Call        Rx faxed to pharmacy Follow-up by: Angelina Ok RN,  October 27, 2009 5:15 PM    Prescriptions: NYSTOP 100000 UNIT/GM POWD (NYSTATIN) Apply to affected area twice a day.  #1 mo supply x 6   Entered and Authorized by:   Zoila Shutter MD   Signed by:   Zoila Shutter MD on 10/27/2009   Method used:   Telephoned to ...       [facility] (retail)       [address]       [place], [place]  [address]       Ph: [phone]       Fax: [phone]   RxID:   [id number]

## 2010-12-01 NOTE — Progress Notes (Signed)
  Phone Note Refill Request Message from:  Fax from Pharmacy on November 29, 2009 3:51 PM  Refills Requested: Medication #1:  CLOTRIMAZOLE ANTI-FUNGAL 1 %  CREA Apply to the area 3-4 times per day  Method Requested: Electronic Initial call taken by: Angelina Ok RN,  November 29, 2009 3:51 PM  Follow-up for Phone Call        signed please call in.    Prescriptions: CLOTRIMAZOLE ANTI-FUNGAL 1 %  CREA (CLOTRIMAZOLE) Apply to the area 3-4 times per day  #1 tube x 11   Entered and Authorized by:   Elby Showers MD   Signed by:   Elby Showers MD on 11/29/2009   Method used:   Telephoned to ...       [facility] (retail)       [address]       [place], [place]  [address]       Ph: [phone]       Fax: [phone]   RxID:   [id number]

## 2010-12-01 NOTE — Assessment & Plan Note (Signed)
Summary: FU MEDS/SASTRY/VS   Vital Signs:  Patient Profile:   70 Years Old Female Weight:      241.02 pounds (109.55 kg) Temp:     98.4 degrees F (36.89 degrees C) oral Pulse rate:   90 / minute BP sitting:   133 / 82  (left arm)  Pt. in pain?   yes    Location:   feet, rt. hip    Intensity:   10    Type:       sharp  Vitals Entered By: Angelina Ok RN (May 08, 2007 4:02 PM)              Is Patient Diabetic? No Nutritional Status BMI of > 30 = obese  Have you ever been in a relationship where you felt threatened, hurt or afraid?No   Does patient need assistance? Functional Status Self care Ambulation Impaired:Risk for fall   PCP:  [place]  Chief Complaint:  Follow up on meds and swelling in feet.  History of Present Illness: [name] is a 70 year old woman with a past medical history of Hypertension, TIA's and arthritis who presents to the office today  for routine follow-up. She has no complaints other than weight gain and fatigue. She is frustrated and unhappy with "all the medications" she is on.  Current Allergies: ! SEPTRA ! ASA    Risk Factors: Tobacco use:  quit    Year quit:  2000  Mammogram History:    Date of Last Mammogram:  12/18/2006    Physical Exam  General:     Well-developed,well-nourished,in no acute distress; alert,appropriate and cooperative throughout examination Lungs:     Normal respiratory effort, chest expands symmetrically. Lungs are clear to auscultation, no crackles or wheezes. Heart:     Normal rate and regular rhythm. S1 and S2 normal without gallop, murmur, click, rub or other extra sounds. Abdomen:     soft, non-tender, no masses, no guarding, no rigidity, no rebound tenderness, bowel sounds hypoactive, and distended.  Dull on percussion. Previous Surgical scars visible. Extremities:     trace left pedal edema.    Skin:     scars from multiple skin abscess. Psych:     normally interactive.       Impression & Recommendations:  Problem # 1:  HYPOTHYROIDISM NOS (ICD-244.9) [name] has a history of an elevated TSH.  Will recheck TSH and a Free T4 today  to confirm Hypothyroidism. If hypothyroid, will start thyroid hormone replacement medication.  Orders: T-TSH [phone]) T-T4, Free [phone])  Labs Reviewed: TSH: 6.049 (12/20/2006)    Chol: 190 (12/20/2006)   HDL: 57 (12/20/2006)   LDL: 105 (12/20/2006)   TG: 139 (12/20/2006)   Problem # 2:  HYPERLIPIDEMIA (ICD-272.4) Stable. Will start her on a Hypertension/Hyperlipidemia combo med in order to reduce the amount of pills she is taking. She says that the Lipitor tablet is " too large for her to swallow" causing her to choke.   The following medications were removed from the medication list:    Lipitor 80 Mg Tabs (Atorvastatin calcium) .[name]... Take 1 tablet by mouth once a day  Her updated medication list for this problem includes:    Caduet 5-80 Mg Tabs (Amlodipine-atorvastatin) .[name]... Take 1 tablet by mouth once a day  Labs Reviewed: Chol: 190 (12/20/2006)   HDL: 57 (12/20/2006)   LDL: 105 (12/20/2006)   TG: 139 (12/20/2006)   Problem # 3:  HYPERTENSION (ICD-401.9) Discontiued Ditiazem today and  will change her to norvasc in a a combo pill- Cauduet. Attempting to decrease the amount of pills she is taking daily. This will increase her adherence to our recommendations.  BP under good control. Will F/U BP in 2 weeks since we are changing her medication. I do not anticipate side effects or a significant change in her BP. I am also changing her HCTZ to Lasix since she needs has some peripheral edema and would benefit from more effective diuresis.  At her follow up in 2 weeks, we should check a BMET to follow her renal function.   The following medications were removed from the medication list:    Hydrochlorothiazide 25 Mg Tabs (Hydrochlorothiazide) .[name]... Take 1 tablet by mouth once a day    Taztia Xt 180 Mg Cp24 (Diltiazem  hcl er beads) .[name]... Take 1 tablet by mouth once a day  Her updated medication list for this problem includes:    Lisinopril 40 Mg Tabs (Lisinopril) .[name]... Take 1 tablet by mouth once a day    Caduet 5-80 Mg Tabs (Amlodipine-atorvastatin) .[name]... Take 1 tablet by mouth once a day    Furosemide 20 Mg Tabs (Furosemide) .[name]... Take 1 tablet by mouth once a day  BP today: 133/82 Prior BP: 147/87 (04/02/2007)  Labs Reviewed: Creat: 1.31 (01/14/2007) Chol: 190 (12/20/2006)   HDL: 57 (12/20/2006)   LDL: 105 (12/20/2006)   TG: 139 (12/20/2006)   Medications Added to Medication List This Visit: 1)  Caduet 5-80 Mg Tabs (Amlodipine-atorvastatin) .... Take 1 tablet by mouth once a day 2)  Furosemide 20 Mg Tabs (Furosemide) .... Take 1 tablet by mouth once a day   Patient Instructions: 1)  F/U in 2 weeks for BP check and BMET. 2)  Medications changed today: 3)  STOP HCTZ and start Lasix 4)  START Caduet 5/80 5)  Stop Diltiazem 6)  Stop Liptor 7)  You meds have been changed today in order to minimize the number of pills you are taking as well as to help with your complaints today. Take all medications as prescribed. 8)  We are checking your thyroid hormones today to confirm hypothyroidism. We will start you on medication if needed at your next visit.    Prescriptions: FUROSEMIDE 20 MG  TABS (FUROSEMIDE) Take 1 tablet by mouth once a day  #30 x 6   Entered and Authorized by:   Julaine Fusi  DO   Signed by:   Julaine Fusi  DO on 05/08/2007   Method used:   Print then Give to Patient   RxID:   [id number] CADUET 5-80 MG  TABS (AMLODIPINE-ATORVASTATIN) Take 1 tablet by mouth once a day  #30 x 6   Entered and Authorized by:   Julaine Fusi  DO   Signed by:   Julaine Fusi  DO on 05/08/2007   Method used:   Print then Give to Patient   RxID:   [id number]

## 2010-12-01 NOTE — Progress Notes (Signed)
Summary: phone/gg  Phone Note Call from Patient   Summary of Call: Pt called and states she can't afford $16 for ANUCORT-HC 25 MG SUPP.  Is there a generic she can use? Initial call taken by: Merrie Roof RN,  Mar 16, 2010 5:06 PM  Follow-up for Phone Call        pt calls again concerning this issue Follow-up by: Marin Roberts RN,  Mar 17, 2010 2:48 PM    New/Updated Medications: HYDROCORTISONE ACETATE 25 MG SUPP (HYDROCORTISONE ACETATE) use once a day. I have change the medicine to generic, can you please send the prescription thanks.

## 2010-12-01 NOTE — Assessment & Plan Note (Signed)
Summary: rash [mkj]   Vital Signs:  Patient Profile:   70 Years Old Female Height:     65 inches (165.10 cm) Weight:      231.7 pounds (105.32 kg) BMI:     38.70 Temp:     97.8 degrees F (36.56 degrees C) oral Pulse rate:   85 / minute BP sitting:   117 / 69  (right arm)  Pt. in pain?   no  Vitals Entered By: Chinita Pester RN (Mar 13, 2008 4:02 PM)              Is Patient Diabetic? No Nutritional Status BMI of > 30 = obese  Have you ever been in a relationship where you felt threatened, hurt or afraid?No   Does patient need assistance? Functional Status Self care Ambulation Normal Comments using a walker     PCP:  [name] MD  Chief Complaint:  Re-check boils and burnig w/urination.  History of Present Illness: This is a 70 year old woman with a past medical history of Cervical cancer, hx of (ovarian cancer 1972 oophorectomy) GERD Gout Hypertension Low back pain,  Sec to DDD and DJD Osteoarthritis - both hips Transient ischemic attack, hx of Sensorineural hearing loss ( bilateral w/chronic L ear pain) CPAP Mitral valve prolapse Chronic venous insufficiency Diverticular Disease Hyperlipidemia Boils Gout   who is coming in for follow-up of her boils.  She finished the course of antibiotics and has not had a draining lesion in over a week.   She also states that she took the fluconazole pills after her course of antibiotic and hasn't had any discharge.  She does complain of vulvar irritation since she ran out of her premarin cream.  Her insurance wasn't going to cover her refill for another few days.  She is also complaining of a rash under her pannus that is itchy and uncomfortable.  She hasn;t tried any treatment yet for this.    Prior Medication List:  AMITRIPTYLINE HCL 50 MG  TABS (AMITRIPTYLINE HCL) Take 1 tablet by mouth at bedtime ALLOPURINOL 300 MG TABS (ALLOPURINOL) Take 1 tablet by mouth once a day KLOR-CON 10 10 MEQ TBCR (POTASSIUM  CHLORIDE) Take 1 tablet by mouth two times a day PLAVIX 75 MG TABS (CLOPIDOGREL BISULFATE) Take 1 tablet by mouth once a day LISINOPRIL 40 MG TABS (LISINOPRIL) Take 1 tablet by mouth once a day PROTONIX 40 MG TBEC (PANTOPRAZOLE SODIUM) Take 1 tablet by mouth once a day VICODIN 5-500 MG TABS (HYDROCODONE-ACETAMINOPHEN) Take 1 tab every 4-6 hrs as needed DETROL LA 4 MG CP24 (TOLTERODINE TARTRATE) Take 1 tablet by mouth once at bedtime LACTULOSE  SOLN (LACTULOSE SOLN) Take 30ml till yo have BM, repeat 4 times in a day. WELLBUTRIN SR 150 MG TB12 (BUPROPION HCL) Take one pill every day for three days, then start taking one pill twice daily. TYLENOL 325 MG TABS (ACETAMINOPHEN) Take 2 tablet by mouth four times a day for pain CADUET 5-80 MG  TABS (AMLODIPINE-ATORVASTATIN) Take 1 tablet by mouth once a day HYDROCHLOROTHIAZIDE 25 MG  TABS (HYDROCHLOROTHIAZIDE) Take 1 tablet by mouth once a day PIROXICAM 20 MG  CAPS (PIROXICAM) Take 1 tablet by mouth once a day BENZONATATE 100 MG  CAPS (BENZONATATE) Take one capsule three times a day for 10 days. METRONIDAZOLE 500 MG  TABS (METRONIDAZOLE) Take one tablet two times a day for 7 days. PREMARIN 0.625 MG/GM  CREA (ESTROGENS, CONJUGATED) 1g application intravaginally and externally daily FLUOCINONIDE 0.05 %  OINT (FLUOCINONIDE) Use as directed PIROXICAM 20 MG  CAPS (PIROXICAM) Take 1 tablet by mouth once a day DOXYCYCLINE HYCLATE 100 MG  TABS (DOXYCYCLINE HYCLATE) Take 1 tablet by mouth two times a day for 21 days FLUCONAZOLE 150 MG  TABS (FLUCONAZOLE) Take one pill as needed for signs of yeast infection   Current Allergies: ! SEPTRA ! ASA    Risk Factors: Tobacco use:  quit    Year quit:  1999 Alcohol use:  no Exercise:  no Seatbelt use:  100 %  Mammogram History:    Date of Last Mammogram:  12/18/2006   Review of Systems  General      Denies chills and fever.  Eyes      Denies blurring.  CV      Denies chest pain or discomfort,  shortness of breath with exertion, and swelling of feet.  Resp      Denies cough and shortness of breath.  GI      Denies abdominal pain and change in bowel habits.  GU      Complains of dysuria.      Denies abnormal vaginal bleeding, discharge, hematuria, urinary frequency, and urinary hesitancy.  Derm      See HPI   Physical Exam  General:     alert, well-developed, well-nourished, and well-hydrated.   Head:     normocephalic and atraumatic.   Eyes:     vision grossly intact, pupils equal, pupils round, and pupils reactive to light.   Ears:     R ear normal and L ear normal.   Lungs:     Normal respiratory effort, chest expands symmetrically. Lungs are clear to auscultation, no crackles or wheezes. Heart:     Normal rate and regular rhythm. S1 and S2 normal without gallop, murmur, click, rub or other extra sounds. Abdomen:     Bowel sounds positive,abdomen soft and non-tender without masses, organomegaly or hernias noted. Extremities:     No clubbing, cyanosis, edema, or deformity noted with normal full range of motion of all joints.   Skin:     Candida intertrigo under pannus and on ventral incisional scar.  Boils all healed, no active lesion found.    Impression & Recommendations:  Problem # 1:  INTERTRIGO, CANDIDAL (ICD-695.89) Nystatin powder and recommendations for hygiene made.  Problem # 2:  BOILS, RECURRENT (ICD-680.9) All healed for now.  Recommendations for hygiene made.  Problem # 3:  HYPERTENSION (ICD-401.9) Good control.  Continue as is.  Her updated medication list for this problem includes:    Lisinopril 40 Mg Tabs (Lisinopril) .[name]... Take 1 tablet by mouth once a day    Caduet 5-80 Mg Tabs (Amlodipine-atorvastatin) .[name]... Take 1 tablet by mouth once a day    Hydrochlorothiazide 25 Mg Tabs (Hydrochlorothiazide) .[name]... Take 1 tablet by mouth once a day  BP today: 117/69 Prior BP: 126/74 (02/14/2008)  Labs Reviewed: Creat: 1.26 (11/20/2007) Chol:  190 (12/20/2006)   HDL: 57 (12/20/2006)   LDL: 105 (12/20/2006)   TG: 139 (12/20/2006)   Complete Medication List: 1)  Amitriptyline Hcl 50 Mg Tabs (Amitriptyline hcl) .... Take 1 tablet by mouth at bedtime 2)  Allopurinol 300 Mg Tabs (Allopurinol) .... Take 1 tablet by mouth once a day 3)  Klor-con 10 10 Meq Tbcr (Potassium chloride) .... Take 1 tablet by mouth two times a day 4)  Plavix 75 Mg Tabs (Clopidogrel bisulfate) .... Take 1 tablet by mouth once a day 5)  Lisinopril 40  Mg Tabs (Lisinopril) .... Take 1 tablet by mouth once a day 6)  Protonix 40 Mg Tbec (Pantoprazole sodium) .... Take 1 tablet by mouth once a day 7)  Vicodin 5-500 Mg Tabs (Hydrocodone-acetaminophen) .... Take 1 tab every 4-6 hrs as needed 8)  Detrol La 4 Mg Cp24 (Tolterodine tartrate) .... Take 1 tablet by mouth once at bedtime 9)  Lactulose Soln (Lactulose soln) .... Take 30ml till yo have bm, repeat 4 times in a day. 10)  Wellbutrin Sr 150 Mg Tb12 (Bupropion hcl) .... Take one pill every day for three days, then start taking one pill twice daily. 11)  Tylenol 325 Mg Tabs (Acetaminophen) .... Take 2 tablet by mouth four times a day for pain 12)  Caduet 5-80 Mg Tabs (Amlodipine-atorvastatin) .... Take 1 tablet by mouth once a day 13)  Hydrochlorothiazide 25 Mg Tabs (Hydrochlorothiazide) .... Take 1 tablet by mouth once a day 14)  Piroxicam 20 Mg Caps (Piroxicam) .... Take 1 tablet by mouth once a day 15)  Benzonatate 100 Mg Caps (Benzonatate) .... Take one capsule three times a day for 10 days. 16)  Premarin 0.625 Mg/gm Crea (Estrogens, conjugated) .[name].. 1g application intravaginally and externally daily 17)  Fluocinonide 0.05 % Oint (Fluocinonide) .... Use as directed 18)  Piroxicam 20 Mg Caps (Piroxicam) .... Take 1 tablet by mouth once a day 19)  Nyamyc 100000 Unit/gm Powd (Nystatin) .... Apply to dry skin twice a day   Patient Instructions: 1)  Please schedule a follow-up appointment in 6 months.    Prescriptions: NYAMYC 100000 UNIT/GM  POWD (NYSTATIN) Apply to dry skin twice a day  #large bottle x 99   Entered and Authorized by:   Dellia Beckwith MD   Signed by:   Dellia Beckwith MD on 03/13/2008   Method used:   Print then Give to Patient   RxID:   [id number]  ]
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
## 2010-12-01 NOTE — Progress Notes (Signed)
Summary: appt 2/10/ hla  Phone Note Call from Patient   Caller: Patient Summary of Call: pt calls c/o cough,congestion, ?fever?-has not checked temp- not productive cough, tight, hard cough per pt, generalized body aches, able to eat and drink, tired and weak, this has been ongoing x 1 wk. has taken several otc's and some old script meds w/ no improvement, nothing agravates these symptoms. she will be seen this pm at 1330. she understands that if she becomes worse, has resp distress to go asap to er or call 911 Initial call taken by: Marin Roberts RN,  December 09, 2009 10:30 AM  Follow-up for Phone Call        agree Follow-up by: Zoila Shutter MD,  December 09, 2009 10:33 AM

## 2010-12-01 NOTE — Miscellaneous (Signed)
Summary: [facility]   Imported By: Shon Hough 05/27/2010 16:41:54  _____________________________________________________________________  External Attachment:    Type:   Image     Comment:   External Document

## 2010-12-01 NOTE — Progress Notes (Signed)
Summary: phone/gg  Phone Note Call from Patient   Summary of Call:   Pt called and would like ointment for rouch areas on her arms.  She said you were going to order something for her but didn't.  FLuocinonide 0.05% ointment  seems to work well.  Will you fill this ? Initial call taken by: Merrie Roof RN,  October 09, 2008 2:19 PM    New/Updated Medications: FLUOCINOLONE ACETONIDE 0.01 % CREA (FLUOCINOLONE ACETONIDE) Use on affected area daily.   Prescriptions: FLUOCINOLONE ACETONIDE 0.01 % CREA (FLUOCINOLONE ACETONIDE) Use on affected area daily.  #1 tube x 1   Entered and Authorized by:   Elby Showers MD   Signed by:   Elby Showers MD on 10/09/2008   Method used:   Electronically to        [facility] [address] [facility] [address] [phone]* (retail)       [address]       [place], [place]  [address]       Ph: [phone] or [phone]       Fax: [phone]   RxID:   [id number]

## 2010-12-01 NOTE — Progress Notes (Signed)
Summary: appt/ hla  Phone Note Call from Patient   Summary of Call: pt calls and states she has a URI, is itching all over and her rash has spread, desires an appt asap, given appt 1/3 Initial call taken by: Marin Roberts RN,  October 31, 2010 2:14 PM  Follow-up for Phone Call        agree Follow-up by: Blanch Media MD,  October 31, 2010 2:14 PM

## 2010-12-01 NOTE — Progress Notes (Signed)
Summary: refill/gg  Phone Note Refill Request  on April 18, 2007 11:35 AM  Refills Requested: Medication #1:  AMITRIPTYLINE HCL 75 MG TABS Take a tablet at bedtime   Last Refilled: 03/05/2007 Pharmacy has pt taking 50 mg and has been filling that dose since last year.  Initial call taken by: Merrie Roof RN,  April 18, 2007 11:36 AM  Follow-up for Phone Call        Refill approved-nurse to complete. Dose corrected in medication list from 75 mg to 50 mg. Follow-up by: Margarito Liner MD,  April 18, 2007 11:59 AM  Additional Follow-up for Phone Call Additional follow up Details #1::        Rx faxed to pharmacy Additional Follow-up by: Merrie Roof RN,  April 18, 2007 12:11 PM  New/Updated Medications: AMITRIPTYLINE HCL 50 MG  TABS (AMITRIPTYLINE HCL) Take 1 tablet by mouth at bedtime  New/Updated Medications: AMITRIPTYLINE HCL 50 MG  TABS (AMITRIPTYLINE HCL) Take 1 tablet by mouth at bedtime  Prescriptions: AMITRIPTYLINE HCL 50 MG  TABS (AMITRIPTYLINE HCL) Take 1 tablet by mouth at bedtime  #31 x 2   Entered and Authorized by:   Margarito Liner MD   Signed by:   Margarito Liner MD on 04/18/2007   Method used:   Telephoned to ...       [facility] [address]       [address]       [place], [place]  [address]       Ph: [phone] or [phone]       Fax: [phone]   RxID:   [id number]

## 2010-12-01 NOTE — Progress Notes (Signed)
Summary: Referral to [facility]  Phone Note Outgoing Call   Summary of Call: Call to Darrian at [facility] re: transfer bench.  He is going to call me back regarding the referral.     Follow-up for Phone Call        Darrien said to make the referral through the main phone number so they can do a home safety eval vs. simply supplying a transfer bench.  Dorothe Pea  June 21, 2010 12:55 PM   Additional Follow-up for Phone Call Additional follow up Details #1::        Called and made referral to [phone] and [facility] will conduct home safety eval.  Dorothe Pea  June 22, 2010 9:18 AM

## 2010-12-01 NOTE — Assessment & Plan Note (Signed)
Summary: boils under arm/gg   Vital Signs:  Patient Profile:   70 Years Old Female Weight:      240.01 pounds (109.10 kg) O2 Sat:      96 % Temp:     100.9 degrees F (38.28 degrees C) oral Pulse rate:   120 / minute BP sitting:   80 / 60  (right arm)  Pt. in pain?   yes    Location:   head    Intensity:   10    Type:       throbbing  Vitals Entered By: Angelina Ok RN (September 05, 2007 2:56 PM) Oxygen therapy Room Air              Is Patient Diabetic? No Nutritional Status BMI of > 30 = obese  Have you ever been in a relationship where you felt threatened, hurt or afraid?No   Does patient need assistance? Functional Status Self care Ambulation Impaired:Risk for fall    Dr. Leslie Dales consulted after pt continued to sweat profusely.  Continued to be alert to place and time.  very talkative about feeling bad.  Problems obtaining B/P.  Pt transported via w/c to ER  for monitoring and IV fluids.  Angelina Ok, RN September 05, 2007 2:51 pm.  PCP:  [name] MD  Chief Complaint:  Larey Seat on way into the hospital, sweating , fell yesteday, c/o severe headache, and boils under arm pit rt side.  History of Present Illness: Ms. Convey was scheduled to be seen today for "boils under her arms".  Upon arrival she was profusely diaphoretic and complained of dizziness with two falls recently.  She was reported to have a SBP of 80.  She denied chest pain, SOB, cough, weakness/numbness or visual changes but did report a headache.  She was sent to the ER to obtain timely IV access, IVF, continuous monitoring, and further evaluation.  This case was discussed with Dr. Maple Hudson who is admitting for our service.    ..................................................................Marland KitchenManning Charity MD  September 05, 2007 6:19 PM   Current Allergies: ! SEPTRA ! ASA        Complete Medication List: 1)  Amitriptyline Hcl 50 Mg Tabs (Amitriptyline hcl) .... Take 1 tablet by mouth at bedtime  2)  Allopurinol 300 Mg Tabs (Allopurinol) .... Take 1 tablet by mouth once a day 3)  Klor-con 10 10 Meq Tbcr (Potassium chloride) .... Take 1 tablet by mouth two times a day 4)  Plavix 75 Mg Tabs (Clopidogrel bisulfate) .... Take 1 tablet by mouth once a day 5)  Lisinopril 40 Mg Tabs (Lisinopril) .... Take 1 tablet by mouth once a day 6)  Protonix 40 Mg Tbec (Pantoprazole sodium) .... Take 1 tablet by mouth once a day 7)  Citalopram Hydrobromide 40 Mg Tabs (Citalopram hydrobromide) .... Take 1 tablet by mouth once a day 8)  Vicodin 5-500 Mg Tabs (Hydrocodone-acetaminophen) .... Take 1 tab every 4-6 hrs as needed 9)  Detrol La 4 Mg Cp24 (Tolterodine tartrate) .... Take 1 tablet by mouth once at bedtime 10)  Lactulose Soln (Lactulose soln) .... Take 30ml till yo have bm, repeat 4 times in a day. 11)  Reglan 10 Mg Tabs (Metoclopramide hcl) .... Take 1 tablet by mouth two times a day 12)  Wellbutrin Sr 150 Mg Tb12 (Bupropion hcl) .... Take one pill every day for three days, then start taking one pill twice daily. 13)  Tylenol 325 Mg Tabs (Acetaminophen) .... Take 2  tablet by mouth four times a day for pain 14)  Caduet 5-80 Mg Tabs (Amlodipine-atorvastatin) .... Take 1 tablet by mouth once a day 15)  Furosemide 20 Mg Tabs (Furosemide) .... Take 1 tablet by mouth once a day     ]

## 2010-12-01 NOTE — Assessment & Plan Note (Signed)
Summary: burning bil feet, spots like cig burns/pcp-Annia Gomm/hla   Vital Signs:  Patient profile:   70 year old female Height:      65 inches (165.10 cm) Weight:      237.1 pounds (107.77 kg) BMI:     39.60 Temp:     97.0 degrees F (36.11 degrees C) oral Pulse rate:   64 / minute BP sitting:   121 / 66  (right arm)  Vitals Entered By: Stanton Kidney Ditzler RN (May 26, 2010 2:31 PM) Is Patient Diabetic? Yes Pain Assessment Patient in pain? yes     Location: chest Intensity: 8 Type: hurts Onset of pain  past 2-3 days Nutritional Status BMI of > 30 = obese Nutritional Status Detail appetite good  Have you ever been in a relationship where you felt threatened, hurt or afraid?denies   Does patient need assistance? Functional Status Self care Ambulation Impaired:Risk for fall Comments Uses a walker. Discuss diabetes. Nonproductive cough past 2-3 days. Both feet hurting and feels on fire past 2-3 days.    Primary Care Provider:  Leodis Sias MD   History of Present Illness: Patient is a 70 year old female who presents today with complaints of itching and burning in her feet.  She states that she has been using her compression stockings and lasix and feels like the swelling is btter in her feet.  She did cut her lasix from two times a day to once daily because she felt she was going to the bathroom too often.    She has also had problems with breathing at nighttime.  She wakes up almost every night with a non-productive cough and shortness of breath.  She does state that she uses 2-3 pillows at night to sleep or sleeps upright in her recliner.  She does get tired with activity but not more then usual.   Lastly she has had a problem with pain in her chest for the last few days. She describes it as being in the center of her chest and like a band around the chest.  It happens at rest and has never happened when she is walking.  It starts usually in the evening a few hours after supper  or in the late afternoon.  She denies diaphoresis, diarrhea, nausea, vomiting, changes in her stool, or coughing up blood.    Depression History:      The patient denies a depressed mood most of the day and a diminished interest in her usual daily activities.        The patient denies that she feels like life is not worth living, denies that she wishes that she were dead, and denies that she has thought about ending her life.         Preventive Screening-Counseling & Management  Alcohol-Tobacco     Alcohol drinks/day: 0     Smoking Status: quit     Year Quit: 1999     Pack years: 50     Passive Smoke Exposure: yes  Caffeine-Diet-Exercise     Does Patient Exercise: no  Current Medications (verified): 1)  Protonix 40 Mg Pack (Pantoprazole Sodium) .... Take One Tablet Daily. 2)  Oxybutynin Chloride 5 Mg Xr24h-Tab (Oxybutynin Chloride) .... Take One Tablet At Bedtime. 3)  Amlodipine Besylate 5 Mg Tabs (Amlodipine Besylate) .... Take One Tablet Daily. 4)  Clotrimazole Anti-Fungal 1 %  Crea (Clotrimazole) .... Apply To The Area 3-4 Times Per Day 5)  Allopurinol 300 Mg Tabs (Allopurinol) .Marland KitchenMarland KitchenMarland Kitchen 1  Tablet Once A Day 6)  Plavix 75 Mg Tabs (Clopidogrel Bisulfate) .[name].. 1 Tablet Daily. 7)  Lisinopril 40 Mg Tabs (Lisinopril) .[name].. 1 Tablet Once A Day. 8)  Biotin 5000 5 Mg Caps (Biotin) .... Take One Tablet Daily. 9)  Apple Cider Vinegar 300 Mg Tabs (Apple Cider Vinegar) .... One Daily 10)  Calcium 500/d 500-200 Mg-Unit Tabs (Calcium Carbonate-Vitamin D) .... One Daily 11)  Vitamin C-Acerola 500 Mg Tabs (Ascorbic Acid) .... One Daily 12)  Lipitor 80 Mg Tabs (Atorvastatin Calcium) .... Take One Tablet Daily. 13)  Polyethylene Glycol 1000  Powd (Polyethylene Glycol 1000) .... Use 17g in Liquid Daily For Constipation. 14)  Furosemide 20 Mg Tabs (Furosemide) .... Take 1 Tablet By Mouth Two Times A Day For Swelling 15)  Fluocinonide 0.05 % Crea (Fluocinonide) .... Apply To Affected Areas Two Times A  Day. 16)  Premarin 0.625 Mg/gm Crea (Estrogens, Conjugated) .... Apply Intravaginally and Externally As Directed 17)  Loratadine 10 Mg Tabs (Loratadine) .... Take One Tablet Daily For Sinuses. 18)  Hydrocortisone Acetate 25 Mg Supp (Hydrocortisone Acetate) .... Use Once A Day. 19)  Nystop 100000 Unit/gm Powd (Nystatin) .... Use As Directed. 20)  Relief Medical Leg Knee High  Misc (Elastic Bandages & Supports) .... Ted Compression Support Hose  Allergies: 1)  ! Septra 2)  ! Asa  Past History:  Past medical, surgical, family and social histories (including risk factors) reviewed for relevance to current acute and chronic problems.  Past Medical History: Reviewed history from 02/14/2008 and no changes required. Cervical cancer, hx of (ovarian cancer 1972 oophorectomy) GERD Gout Hypertension Low back pain,  Sec to DDD and DJD Osteoarthritis - both hips Transient ischemic attack, hx of Sensorineural hearing loss ( bilateral w/chronic L ear pain) CPAP Mitral valve prolapse Chronic venous insufficiency Diverticular Disease Hyperlipidemia Boils Gout  Past Surgical History: Reviewed history from 09/03/2006 and no changes required. Total hip replacement -Both Hip 2005 and 2006 Hysterectomy 1972 Oophorectomy 1972 DJD- L5-S1 s/p sx C5-C6 Diskectomy 07/07  Family History: Reviewed history and no changes required.  Social History: Reviewed history from 09/29/2008 and no changes required. 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  Vital signs reviewed.  Head: Atraumatic, normocephalic with no signs of trauma  Eyes: PERRLA, EOM intact  Ears: TM intact  Nose: Nares patent, mucosa is pink and moist, no polyps noted  Mouth: Mucosa is pink and moist, dention,   Neck: Supple, full ROM, no thyromegaly or masses noted  Resp: Clear to ascultation bilaterally,  no wheezes, rales, or rhonchi noted  CV: Regular rate and rhythm with no murmurs, rubs, or gallops noted  Abdomen: Soft,non-distended with normal bowel sounds.  There is mild tenderness to palpation  Extremities: Lower extremities are much improved since last visit.  there is still +1 edema bilaterally.  On the dorsum of the foot bilaterally there is a plaque that is slightly darker then the surrounding skin and with excoriation marks over both of the.  There is no erythema, warmth to the touch, or exudate. Pulses: Radial, brachial, carotid, femoral, dorsal pedis, and posterior tibial pulses equal and symmetric     Impression & Recommendations:  Problem # 1:  VENOUS INSUFFICIENCY (ICD-459.81) Her swelling is much better today.  She has a spot of eczema on the dorsum of both feet that itches.  We  will start triamcimalone cream that she can use two times a day to help resolve the itching.  We will also switch her from Claritin to Zyrtec and see her back in a month to see how she is doing.    Problem # 2:  LEG EDEMA, BILATERAL (ICD-782.3) We will decrease her Lasix to once daily for now since her swelling is better.  I encouraged her to continue using her compression stockings.    Her updated medication list for this problem includes:    Furosemide 20 Mg Tabs (Furosemide) .[name]... Take 1 tablet by mouth once a day for swelling  Problem # 3:  GERD (ICD-530.81) Her chest pain is sounds like break through reflux pain.  She is advised that she can use Tums or Mylanta for the break through pain.    Her updated medication list for this problem includes:    Protonix 40 Mg Pack (Pantoprazole sodium) .[name]... Take one tablet daily.  Problem # 4:  UNSPECIFIED DIASTOLIC HEART FAILURE (ICD-428.30) We will check a BNP today to see if she has a component of heart failure that is playing a part in her breathing problems.  We might need to adjust her Lasix up some or refer her to a cardiology for more definative  treatment.    Her updated medication list for this problem includes:    Plavix 75 Mg Tabs (Clopidogrel bisulfate) .[name]... 1 tablet daily.    Lisinopril 40 Mg Tabs (Lisinopril) .[name]... 1 tablet once a day.    Furosemide 20 Mg Tabs (Furosemide) .[name]... Take 1 tablet by mouth once a day for swelling  Orders: T-BNP  (B Natriuretic Peptide) ([id number])  Problem # 5:  HYPOTHYROIDISM NOS (ICD-244.9) She has a history of hypothyroidism her last TSH was in april so we will check it again today to make sure it is stable.  Orders: T-T4, Free [phone]) T-TSH ([phone])  Labs Reviewed: TSH: 1.677 (02/08/2010)    HgBA1c: 6.2 (05/26/2010) Chol: 199 (02/08/2010)   HDL: 48 (02/08/2010)   LDL: 116 (02/08/2010)   TG: 175 (02/08/2010)  Problem # 6:  HYPERTENSION (ICD-401.9) She is due for a BMP to check on her electrolytes with her medicines.  Her updated medication list for this problem includes:    Amlodipine Besylate 5 Mg Tabs (Amlodipine besylate) .[name]... Take one tablet daily.    Lisinopril 40 Mg Tabs (Lisinopril) .[name]... 1 tablet once a day.    Furosemide 20 Mg Tabs (Furosemide) .[name]... Take 1 tablet by mouth once a day for swelling  Orders: T-Basic Metabolic Panel ([phone])  BP today: 121/66 Prior BP: 120/71 (05/06/2010)  Prior 10 Yr Risk Heart Disease: 15 % (04/29/2008)  Labs Reviewed: K+: 4.6 (03/21/2010) Creat: : 1.16 (03/21/2010)   Chol: 199 (02/08/2010)   HDL: 48 (02/08/2010)   LDL: 116 (02/08/2010)   TG: 175 (02/08/2010)  Problem # 7:  HYPERLIPIDEMIA (ICD-272.4) She will be due for a recheck of her cholesterol in October.    Her updated medication list for this problem includes:    Lipitor 80 Mg Tabs (Atorvastatin calcium) .[name]... Take one tablet daily.  Labs Reviewed: SGOT: 21 (02/08/2010)   SGPT: 21 (02/08/2010)  Prior 10 Yr Risk Heart Disease: 15 % (04/29/2008)   HDL:48 (02/08/2010), 43 (12/15/2008)  LDL:116 (02/08/2010), 92 (28/41/3244)  Chol:199 (02/08/2010), 177  (12/15/2008)  Trig:175 (02/08/2010), 208 (12/15/2008)  Complete Medication List: 1)  Protonix 40 Mg Pack (Pantoprazole sodium) .... Take one tablet daily. 2)  Oxybutynin Chloride 5 Mg Xr24h-tab (Oxybutynin chloride) .Marland KitchenMarland KitchenMarland Kitchen  Take one tablet at bedtime. 3)  Amlodipine Besylate 5 Mg Tabs (Amlodipine besylate) .... Take one tablet daily. 4)  Clotrimazole Anti-fungal 1 % Crea (Clotrimazole) .... Apply to the area 3-4 times per day 5)  Allopurinol 300 Mg Tabs (Allopurinol) .[name].. 1 tablet once a day 6)  Plavix 75 Mg Tabs (Clopidogrel bisulfate) .[name].. 1 tablet daily. 7)  Lisinopril 40 Mg Tabs (Lisinopril) .[name].. 1 tablet once a day. 8)  Biotin 5000 5 Mg Caps (Biotin) .... Take one tablet daily. 9)  Apple Cider Vinegar 300 Mg Tabs (Apple cider vinegar) .... One daily 10)  Calcium 500/d 500-200 Mg-unit Tabs (Calcium carbonate-vitamin d) .... One daily 11)  Vitamin C-acerola 500 Mg Tabs (Ascorbic acid) .... One daily 12)  Lipitor 80 Mg Tabs (Atorvastatin calcium) .... Take one tablet daily. 13)  Polyethylene Glycol 1000 Powd (Polyethylene glycol 1000) .... Use 17g in liquid daily for constipation. 14)  Furosemide 20 Mg Tabs (Furosemide) .... Take 1 tablet by mouth once a day for swelling 15)  Fluocinonide 0.05 % Crea (Fluocinonide) .... Apply to affected areas two times a day. 16)  Premarin 0.625 Mg/gm Crea (Estrogens, conjugated) .... Apply intravaginally and externally as directed 17)  Cetirizine Hcl 10 Mg Tabs (Cetirizine hcl) .... Take 1 tablet by mouth once a day for itching 18)  Hydrocortisone Acetate 25 Mg Supp (Hydrocortisone acetate) .... Use once a day. 19)  Nystop 100000 Unit/gm Powd (Nystatin) .... Use as directed. 20)  Relief Medical Leg Knee High Misc (Elastic bandages & supports) .... Ted compression support hose 21)  Triamcinolone Acetonide 0.1 % Oint (Triamcinolone acetonide) .... Apply to affected area two times a day  Other Orders: T-Hgb A1C (in-house) ([id number])  Patient  Instructions: 1)  Start Zyrtec 10 mg daily for the itching.  2)  Stop the Claritin 3)  Start Triamcimalone cream on your feet for the itching. 4)  You can take tums or mylanta for the chest/stomach pain along with your Protonix. 5)  Continue using the compression stockings 6)  Keep trying to exercise as much as you can.  Shoot for going around the block a few times a week. 7)  Continue all your other medications. Prescriptions: TRIAMCINOLONE ACETONIDE 0.1 % OINT (TRIAMCINOLONE ACETONIDE) Apply to affected area two times a day  #100g x 0   Entered and Authorized by:   Leodis Sias MD   Signed by:   Leodis Sias MD on 05/26/2010   Method used:   Faxed to ...       [facility] (retail)       [address]       [place], [place]  [address]       Ph: [phone]       Fax: [phone]   RxID:   [id number] CETIRIZINE HCL 10 MG TABS (CETIRIZINE HCL) Take 1 tablet by mouth once a day for itching  #30 x 6   Entered and Authorized by:   Leodis Sias MD   Signed by:   Leodis Sias MD on 05/26/2010   Method used:   Faxed to ...       [facility] (retail)       [address]       [place], [place]  [address]       Ph: [phone]       Fax: [phone]   RxID:   [id number] TRIAMCINOLONE ACETONIDE 0.1 % OINT (TRIAMCINOLONE ACETONIDE) Apply to affected area two times  a day  #100g x 0   Entered and Authorized by:   Leodis Sias MD   Signed by:   Leodis Sias MD on 05/26/2010   Method used:   Print then Give to Patient   RxID:   [id number]   Prevention & Chronic Care Immunizations   Influenza vaccine: Fluvax MCR  (07/26/2009)   Influenza vaccine deferral: Not available  (05/06/2010)   Influenza vaccine due: 08/08/2008    Tetanus booster: 05/07/2009: Td   Td booster deferral: Not indicated  (05/06/2010)    Pneumococcal vaccine: Not documented   Pneumococcal vaccine deferral: Deferred   (12/09/2009)    H. zoster vaccine: Not documented   H. zoster vaccine deferral: Deferred  (12/09/2009)  Colorectal Screening   Hemoccult: Not documented   Hemoccult action/deferral: Ordered  (12/09/2009)    Colonoscopy: Not documented   Colonoscopy action/deferral: Deferred  (12/09/2009)  Other Screening   Pap smear: Not documented   Pap smear action/deferral: Not indicated S/P hysterectomy  (05/14/2009)    Mammogram: ASSESSMENT: Negative - BI-RADS 1^MM DIGITAL SCREENING  (12/01/2009)   Mammogram due: 12/22/2010    DXA bone density scan: Not documented   DXA bone density action/deferral: Deferred  (12/09/2009)   Smoking status: quit  (05/26/2010)  Lipids   Total Cholesterol: 199  (02/08/2010)   Lipid panel action/deferral: Lipid Panel ordered   LDL: 116  (02/08/2010)   LDL Direct: Not documented   HDL: 48  (02/08/2010)   Triglycerides: 175  (02/08/2010)    SGOT (AST): 21  (02/08/2010)   BMP action: Ordered   SGPT (ALT): 21  (02/08/2010)   Alkaline phosphatase: 93  (02/08/2010)   Total bilirubin: 0.5  (02/08/2010)    Lipid flowsheet reviewed?: Yes   Progress toward LDL goal: At goal  Hypertension   Last Blood Pressure: 121 / 66  (05/26/2010)   Serum creatinine: 1.16  (03/21/2010)   Serum potassium 4.6  (03/21/2010)    Hypertension flowsheet reviewed?: Yes   Progress toward BP goal: At goal  Self-Management Support :   Personal Goals (by the next clinic visit) :      Personal blood pressure goal: 140/90  (12/09/2009)     Personal LDL goal: 130  (12/09/2009)    Patient will work on the following items until the next clinic visit to reach self-care goals:     Medications and monitoring: take my medicines every day, check my blood pressure, bring all of my medications to every visit, weigh myself weekly  (05/26/2010)     Eating: eat more vegetables, use fresh or frozen vegetables, eat foods that are low in salt, eat baked foods instead of fried foods, eat fruit  for snacks and desserts, limit or avoid alcohol  (05/26/2010)     Activity: take a 30 minute walk every day  (05/06/2010)    Hypertension self-management support: Written self-care plan, Resources for patients handout  (05/26/2010)   Hypertension self-care plan printed.    Lipid self-management support: Written self-care plan, Resources for patients handout  (05/26/2010)   Lipid self-care plan printed.      Resource handout printed.   Process Orders Check Orders Results:     [facility]: Order checked:     [id number] -- T-BNP  (B Natriuretic Peptide) -- ABN required due to diagnosis (CPT: [phone]) Tests Sent for requisitioning (May 30, 2010 9:43 PM):     05/26/2010: [facility] -- T-T4, [place] [[id number]] (signed)     05/26/2010: [facility] --  T-Basic Metabolic Panel [[id number]] (signed)     05/26/2010: [facility] -- T-BNP  (B Natriuretic Peptide) [phone] (signed)     05/26/2010: [facility] -- T-TSH [phone] (signed)    Laboratory Results   Blood Tests   Date/Time Received: May 26, 2010 3:38 PM  Date/Time Reported: Burke Keels  May 26, 2010 3:38 PM   HGBA1C: 6.2%   (Normal Range: Non-Diabetic - 3-6%   Control Diabetic - 6-8%)

## 2010-12-01 NOTE — Progress Notes (Signed)
Summary: Refill/gh  Phone Note Refill Request Message from:  Fax from Pharmacy on May 28, 2009 3:44 PM  Refills Requested: Medication #1:  CLOTRIMAZOLE ANTI-FUNGAL 1 %  CREA Apply to the area 3-4 times per day Pt wants larger quantity of medication.   Method Requested: Electronic Initial call taken by: Angelina Ok RN,  May 28, 2009 3:44 PM  Follow-up for Phone Call        she can ask for this at the pharmacy, prescription does not specify ammount so she should be able to get larger ammounts I think.

## 2010-12-01 NOTE — Progress Notes (Signed)
Summary: med refill/gp  Phone Note Refill Request Message from:  Fax from Pharmacy on February 04, 2009 12:11 PM  Refills Requested: Medication #1:  CLOTRIMAZOLE ANTI-FUNGAL 1 %  CREA Apply to the area 3-4 times per day   Last Refilled: 01/06/2009                     #2 Request refill  Premarin Vaginal Cream -  Use 1gram vaginally and externally daily                          Last refilled 12/15/08                          Talked to pt., she prefers Premarin tablets instead of cream    Method Requested: Electronic Initial call taken by: Chinita Pester RN,  February 04, 2009 12:13 PM      Prescriptions: CLOTRIMAZOLE ANTI-FUNGAL 1 %  CREA (CLOTRIMAZOLE) Apply to the area 3-4 times per day  #1 tube x 11   Entered and Authorized by:   Elby Showers MD   Signed by:   Elby Showers MD on 02/05/2009   Method used:   Electronically to        [facility] [phone][facility] (retail)       [address]       [place], [place]  [address]       Ph: [phone] or [phone]       Fax: [phone]   RxID:   [id number]

## 2010-12-01 NOTE — Progress Notes (Signed)
Summary: Lab results  Phone Note Outgoing Call   Call placed by: Angelina Ok RN,  May 21, 2007 12:15 PM    Call placed to: Patient Summary of Call: RTC to pt.  wanted to get lab results form last  office visit.  Spoke with Dr. Phillips Odor.  Pt was informed that lab were normaland to continue on current meds.  Pt voiced understanding of plan.  Angelina Ok, RN  May 21, 2007 12:17 PM

## 2010-12-01 NOTE — Assessment & Plan Note (Signed)
Summary: HFU-PER DR WILSON/CBC-ANEMIA  Medications Added HYDROCHLOROTHIAZIDE 25 MG  TABS (HYDROCHLOROTHIAZIDE) Take 1 tablet by mouth once a day PIROXICAM 20 MG  CAPS (PIROXICAM) Take 1 tablet by mouth once a day        Vital Signs:  Patient Profile:   70 Years Old Female Weight:      236.4 pounds (107.45 kg) Temp:     98.9 degrees F (37.17 degrees C) oral Pulse rate:   96 / minute BP sitting:   150 / 93  (right arm)  Pt. in pain?   no  Vitals Entered By: Stanton Kidney Ditzler RN (September 25, 2007 3:17 PM)              Is Patient Diabetic? No Nutritional Status Detail ok  Have you ever been in a relationship where you felt threatened, hurt or afraid?denies   Does patient need assistance? Functional Status Self care Ambulation Normal     PCP:  [name] MD  Chief Complaint:  HFU.  History of Present Illness: This is a 70 year old woman with a past medical history of Cervical cancer, hx of (ovarian cancer 1972 oophorectomy) GERD Gout Hypertension Low back pain,  Sec to DDD and DJD Osteoarthritis - both hips Transient ischemic attack, hx of Sensorineural hearing loss ( bilateral w/chronic L ear pain) CPAP Mitral valve prolapse Chronic venous insufficiency Diverticular Disease Hyperlipidemia Gout   who was recently admitted to the hospital in sepsis secondary to multiple MRSA boils on her right axilla, left axilla and sternum.  These were drained by Dr Michaell Cowing and Dr Zachery Dakins, who she saw today and continued her on 14 more days of doxycycline.  She denies fever and chills, denies dizziness, and denies uncontrollable pain in these areas, except when the bandage is being changed.  She also was anemic in the hospital with a hemoglobin going from 10.7 to 8.3 on discharge, felt to be secondary to chronic disease.  This is being rechecked today.  Current Allergies (reviewed today): ! SEPTRA ! ASA    Risk Factors: Tobacco use:  quit    Year quit:  2000   Mammogram History:    Date of Last Mammogram:  12/18/2006   Review of Systems  General      Denies chills, fever, loss of appetite, and sweats.  CV      Denies chest pain or discomfort.  Resp      Complains of cough.      Denies shortness of breath and sputum productive.  GI      Denies abdominal pain, change in bowel habits, and diarrhea.  GU      Had a yeast infection during last course of doxycycline, took diflucan and now her symptoms are resolved.  She is starting another course of doxycycline today and is worried it may come back.   Physical Exam  General:     alert, well-developed, and well-nourished.   Head:     normocephalic.   Lungs:     Normal respiratory effort, chest expands symmetrically. Lungs are clear to auscultation, no crackles or wheezes. Heart:     Normal rate and regular rhythm. S1 and S2 normal without gallop, murmur, click, rub or other extra sounds. Abdomen:     Bowel sounds positive,abdomen soft and non-tender without masses, organomegaly or hernias noted. Extremities:     trace left pedal edema and trace right pedal edema.   Neurologic:     alert & oriented X3.   Skin:  Wounds look good, well healed, on left axilla and sternum.  Right axilla with bandage, seems to have some drainage. Psych:     Slightly pressured speech and very tangential.    Impression & Recommendations:  Problem # 1:  CELLULITIS, METHICILLIN RESISTANT STAPHYLOCCOCUS AREUS (ICD-682.9) Is going to start another 2 week course of doxycycline per Dr Zachery Dakins.  She is afebrile and seems to be healing well.  Problem # 2:  HYPERTENSION (ICD-401.9) BP elevated today, but she still has an infection.  WIll give her time to heal before making any changes.  BMET pending Her updated medication list for this problem includes:    Lisinopril 40 Mg Tabs (Lisinopril) .[name]... Take 1 tablet by mouth once a day    Caduet 5-80 Mg Tabs (Amlodipine-atorvastatin) .[name]... Take 1 tablet by  mouth once a day    Hydrochlorothiazide 25 Mg Tabs (Hydrochlorothiazide) .[name]... Take 1 tablet by mouth once a day  Orders: T-CBC No Diff ([id number]) T-Basic Metabolic Panel ([id number])  BP today: 150/93 Prior BP: 80/60 (09/05/2007)  Labs Reviewed: Creat: 1.30 (07/03/2007) Chol: 190 (12/20/2006)   HDL: 57 (12/20/2006)   LDL: 105 (12/20/2006)   TG: 139 (12/20/2006)   Problem # 3:  ANEMIA OF CHRONIC DISEASE (ICD-285.29) Hgb improved to 9.6 today.  The patient is feeling fine, no dizziness or chest pain.  Will follow  Complete Medication List: 1)  Amitriptyline Hcl 50 Mg Tabs (Amitriptyline hcl) .... Take 1 tablet by mouth at bedtime 2)  Allopurinol 300 Mg Tabs (Allopurinol) .... Take 1 tablet by mouth once a day 3)  Klor-con 10 10 Meq Tbcr (Potassium chloride) .... Take 1 tablet by mouth two times a day 4)  Plavix 75 Mg Tabs (Clopidogrel bisulfate) .... Take 1 tablet by mouth once a day 5)  Lisinopril 40 Mg Tabs (Lisinopril) .... Take 1 tablet by mouth once a day 6)  Protonix 40 Mg Tbec (Pantoprazole sodium) .... Take 1 tablet by mouth once a day 7)  Vicodin 5-500 Mg Tabs (Hydrocodone-acetaminophen) .... Take 1 tab every 4-6 hrs as needed 8)  Detrol La 4 Mg Cp24 (Tolterodine tartrate) .... Take 1 tablet by mouth once at bedtime 9)  Lactulose Soln (Lactulose soln) .... Take 30ml till yo have bm, repeat 4 times in a day. 10)  Wellbutrin Sr 150 Mg Tb12 (Bupropion hcl) .... Take one pill every day for three days, then start taking one pill twice daily. 11)  Tylenol 325 Mg Tabs (Acetaminophen) .... Take 2 tablet by mouth four times a day for pain 12)  Caduet 5-80 Mg Tabs (Amlodipine-atorvastatin) .... Take 1 tablet by mouth once a day 13)  Hydrochlorothiazide 25 Mg Tabs (Hydrochlorothiazide) .... Take 1 tablet by mouth once a day 14)  Piroxicam 20 Mg Caps (Piroxicam) .... Take 1 tablet by mouth once a day   Patient Instructions: 1)  Please schedule a follow-up appointment in 3 months.     Prescriptions: WELLBUTRIN SR 150 MG TB12 (BUPROPION HCL) Take one pill every day for three days, then start taking one pill twice daily.  #60 x 11   Entered and Authorized by:   Dellia Beckwith MD   Signed by:   Dellia Beckwith MD on 09/25/2007   Method used:   Print then Give to Patient   RxID:   [id number] AMITRIPTYLINE HCL 50 MG  TABS (AMITRIPTYLINE HCL) Take 1 tablet by mouth at bedtime  #30 x 5   Entered and Authorized by:   Dellia Beckwith MD  Signed by:   Dellia Beckwith MD on 09/25/2007   Method used:   Print then Give to Patient   RxID:   [id number] DETROL LA 4 MG CP24 (TOLTERODINE TARTRATE) Take 1 tablet by mouth once at bedtime  #30 x 11   Entered and Authorized by:   Dellia Beckwith MD   Signed by:   Dellia Beckwith MD on 09/25/2007   Method used:   Print then Give to Patient   RxID:   [id number] PROTONIX 40 MG TBEC (PANTOPRAZOLE SODIUM) Take 1 tablet by mouth once a day  #30 x 11   Entered and Authorized by:   Dellia Beckwith MD   Signed by:   Dellia Beckwith MD on 09/25/2007   Method used:   Print then Give to Patient   RxID:   [id number] HYDROCHLOROTHIAZIDE 25 MG  TABS (HYDROCHLOROTHIAZIDE) Take 1 tablet by mouth once a day  #30 x 11   Entered and Authorized by:   Dellia Beckwith MD   Signed by:   Dellia Beckwith MD on 09/25/2007   Method used:   Print then Give to Patient   RxID:   [id number] KLOR-CON 10 10 MEQ TBCR (POTASSIUM CHLORIDE) Take 1 tablet by mouth two times a day  #60 x 11   Entered and Authorized by:   Dellia Beckwith MD   Signed by:   Dellia Beckwith MD on 09/25/2007   Method used:   Print then Give to Patient   RxID:   [id number] PLAVIX 75 MG TABS (CLOPIDOGREL BISULFATE) Take 1 tablet by mouth once a day  #0 x 0   Entered and Authorized by:   Dellia Beckwith MD   Signed by:   Dellia Beckwith MD on 09/25/2007   Method used:   Print then Give to Patient   RxID:   [id number]  ]  Vital  Signs:  Patient Profile:   70 Years Old Female Weight:      236.4 pounds (107.45 kg) Temp:     98.9 degrees F (37.17 degrees C) oral Pulse rate:   96 / minute BP sitting:   150 / 93

## 2010-12-01 NOTE — Miscellaneous (Signed)
Summary: labs  Have been trying to reach the patient to come in for a CBC check on Monday. Have left a message to call back on clinic number. Will put i norder for CBC. Asked her to stop taking plavix untill then.  Page me [phone]) with result.     Process Orders Check Orders Results:     [facility]: Check successful Order queued for requisitioning for [facility]: July 01, 2010 3:05 PM  Tests Sent for requisitioning (July 01, 2010 6:39 PM):     07/04/2010: [facility] -- T-CBC No Diff [[id number]] (signed)

## 2010-12-01 NOTE — Miscellaneous (Signed)
Summary: HIPAA Restrictions  HIPAA Restrictions   Imported By: Florinda Marker 07/10/2008 15:08:38  _____________________________________________________________________  External Attachment:    Type:   Image     Comment:   External Document

## 2010-12-01 NOTE — Assessment & Plan Note (Signed)
Summary: check up,some lingering cough, "walking pneumonia"per pt/pcp-...   Vital Signs:  Patient Profile:   70 Years Old Female Height:     65 inches (165.10 cm) Weight:      235.5 pounds (107.05 kg) BMI:     39.33 Temp:     97.9 degrees F (36.61 degrees C) oral Pulse rate:   89 / minute BP sitting:   138 / 70  (left arm) Cuff size:   large  Pt. in pain?   yes    Location:   BILATERAL LEG    Intensity:    8    Type:       ACHE/BURNS  Vitals Entered By: Theotis Barrio NT II (December 15, 2008 3:58 PM)              Is Patient Diabetic? No Nutritional Status BMI of > 30 = obese  Does patient need assistance? Functional Status Self care Ambulation Normal Comments WALKS WITH THE ASSIST OF A WALKER     PCP:  [name] MD  Chief Complaint:  BILATERAL LEG PAIN / MEDICATION REFILL  / PATIENT STATES SHE FELL LAST WEEK/COUGH.  History of Present Illness: This is a 70 yo woman with pmh of Cervical cancer, hx of (ovarian cancer 1972 oophorectomy), gout, HTN, DJD, hx TIA, mitral valve prolapse, HLD, MRSA skin infections who is here today with the following complaints:   1) cough: has been present for 2 weeks, no sputum production, no fevers/chills, n/v/d, no chest pain.  No other URI symtpoms.  2) foot itching: both feet from the ankles down, skin is hyperkaratotic, this has been going on for years, they itch all the time, sometimes itch so much they burn.  No lesions, no other itchy areas, no numbness.        Prior Medications Reviewed Using: Patient Recall  Prior Medication List:  OMEPRAZOLE 40 MG CPDR (OMEPRAZOLE) Take one tablet daily. DETROL LA 4 MG CP24 (TOLTERODINE TARTRATE) Take 1 tablet by mouth once at bedtime WELLBUTRIN SR 150 MG TB12 (BUPROPION HCL) Take one pill every day for three days, then start taking one pill twice daily. AMLODIPINE BESYLATE 5 MG TABS (AMLODIPINE BESYLATE) Take one tablet daily. CLOTRIMAZOLE ANTI-FUNGAL 1 %  CREA (CLOTRIMAZOLE)  Apply to the area 3-4 times per day TRAMADOL HCL 50 MG TABS (TRAMADOL HCL) Take one tablet every 8 hrs as needed for pain. PIROXICAM 20 MG  CAPS (PIROXICAM) Take one tablet daily. AMITRIPTYLINE HCL 50 MG TABS (AMITRIPTYLINE HCL) 1 tablet at bed time. ALLOPURINOL 300 MG TABS (ALLOPURINOL) 1 tablet once a day KLOR-CON 10 10 MEQ CR-TABS (POTASSIUM CHLORIDE) 1 tablet twice a day. PLAVIX 75 MG TABS (CLOPIDOGREL BISULFATE) 1 tablet daily. LISINOPRIL 40 MG TABS (LISINOPRIL) 1 tablet once a day. BIOTIN 5000 5 MG CAPS (BIOTIN) Take one tablet daily. APPLE CIDER VINEGAR 300 MG TABS (APPLE CIDER VINEGAR) one daily CALCIUM 500/D 500-200 MG-UNIT TABS (CALCIUM CARBONATE-VITAMIN D) one daily VITAMIN C-ACEROLA 500 MG TABS (ASCORBIC ACID) one daily LIPITOR 80 MG TABS (ATORVASTATIN CALCIUM) Take one tablet daily. POLYETHYLENE GLYCOL 1000  POWD (POLYETHYLENE GLYCOL 1000) Use 17g in liquid daily for constipation. HYDROCHLOROTHIAZIDE 25 MG TABS (HYDROCHLOROTHIAZIDE) Take one tablet daily. FLUOCINOLONE ACETONIDE 0.01 % CREA (FLUOCINOLONE ACETONIDE) Use on affected area daily.   Current Allergies: ! SEPTRA ! ASA    Risk Factors: Tobacco use:  quit    Year quit:  1999    Pack-years:  50 Passive smoke exposure:  yes Alcohol use:  no  Exercise:  no Seatbelt use:  100 %  Mammogram History:    Date of Last Mammogram:  12/18/2006   Review of Systems       per hpi. all other systems negative.   Physical Exam  General:     alert and overweight-appearing.   Lungs:     normal respiratory effort and normal breath sounds.   Heart:     normal rate, regular rhythm, and no murmur.   Abdomen:     soft, non-tender, and normal bowel sounds.   Pulses:     2+ Extremities:     no edema Neurologic:     alert & oriented X3 and cranial nerves II-XII intact.   Skin:     skin over both feet is dark and hyperkaratotic. skin in right axilla is pink, first layer of dermis is off, no exudate or induration, no  open lesion Cervical Nodes:     no anterior cervical adenopathy and no posterior cervical adenopathy.   Psych:     Oriented X3, moderately anxious, easily distracted, poor concentration, and hyperactive.      Impression & Recommendations:  Problem # 1:  HYPERTENSION (ICD-401.9) BP up today.  She seems aggitated, will monitor for now.   Her updated medication list for this problem includes:    Amlodipine Besylate 5 Mg Tabs (Amlodipine besylate) .[name]... Take one tablet daily.    Lisinopril 40 Mg Tabs (Lisinopril) .[name]... 1 tablet once a day.    Hydrochlorothiazide 25 Mg Tabs (Hydrochlorothiazide) .[name]... Take one tablet daily.  BP today: 138/70 Prior BP: 121/71 (09/29/2008)  Prior 10 Yr Risk Heart Disease: 15 % (04/29/2008)  Labs Reviewed: Creat: 1.26 (09/29/2008) Chol: 205 (04/29/2008)   HDL: 46 (04/29/2008)   LDL: 127 (04/29/2008)   TG: 158 (04/29/2008)   Problem # 2:  URI (ICD-465.9) Has had cough for several weeks.  Will give mucinex dm.  will check CBC to be sure there is no white count.  lungs sound good, no shortness of breath. she reports no fevers/chills.  Suspect this is a viral bronchitis, and will manage symptoms.  The following medications were removed from the medication list:    Piroxicam 20 Mg Caps (Piroxicam) .[name]... Take one tablet daily.  Her updated medication list for this problem includes:    [facility] Chest Congestion Relief Dm 20-400 Mg Tabs (Dextromethorphan-guaifenesin) .[name]... Take one tablet every 6-8 hours as needed for cough.   Problem # 3:  HYPOTHYROIDISM NOS (ICD-244.9) TSH appropriate at last check.  Is not on synthroid, so I am not sure about this diagnosis.  Labs Reviewed: TSH: 3.821 (09/29/2008)    Chol: 205 (04/29/2008)   HDL: 46 (04/29/2008)   LDL: 127 (04/29/2008)   TG: 158 (04/29/2008)   Problem # 4:  HYPERLIPIDEMIA (ICD-272.4) Will check lipids and CMET today to follow.  Her updated medication list for this problem includes:    Lipitor 80 Mg  Tabs (Atorvastatin calcium) .[name]... Take one tablet daily.  Labs Reviewed: Chol: 205 (04/29/2008)   HDL: 46 (04/29/2008)   LDL: 127 (04/29/2008)   TG: 158 (04/29/2008) SGOT: 18 (06/19/2008)   SGPT: 14 (06/19/2008)  Prior 10 Yr Risk Heart Disease: 15 % (04/29/2008)  Orders: T-Comprehensive Metabolic Panel ([id number]) T-Lipid Profile ([id number])   Problem # 5:  ECZEMA (ICD-692.9) She has been using a lower potency steriod on her feet and this is not working to control her itching.  I have called her pharmacy and will put her back on the previous  steriod cream, which worked.  I think that she also has eczema in her right axilla.  She has been using an anti-fungal cream there which has not been working.  I suggested she try the steriod cream.  Her updated medication list for this problem includes:    Fluocinonide 0.05 % Crea (Fluocinonide) .[name]... Apply to affected areas two times a day.   Problem # 6:  DEPRESSION (ICD-311) Ms. Sherr had previously been prescribed welbutrin and amitriptyline for depression and sleep assistance.  Everytime she comes in she reports symptoms of syncope... though it is never substanciated, there are no signs of injury etc.  She requests a refill on these medications.  I will not refill the amitriptyline due to the question of syncope.  She reports no symptoms of depression at this time and she has not been taking the wellbutrin, so I will not refill it either.  The following medications were removed from the medication list:    Wellbutrin Sr 150 Mg Tb12 (Bupropion hcl) .[name]... Take one pill every day for three days, then start taking one pill twice daily.    Amitriptyline Hcl 50 Mg Tabs (Amitriptyline hcl) .[name]... 1 tablet at bed time.   Complete Medication List: 1)  Omeprazole 40 Mg Cpdr (Omeprazole) .... Take one tablet daily. 2)  Detrol La 4 Mg Cp24 (Tolterodine tartrate) .... Take 1 tablet by mouth once at bedtime 3)  Amlodipine Besylate 5 Mg Tabs  (Amlodipine besylate) .... Take one tablet daily. 4)  Clotrimazole Anti-fungal 1 % Crea (Clotrimazole) .... Apply to the area 3-4 times per day 5)  Tramadol Hcl 50 Mg Tabs (Tramadol hcl) .... Take one tablet every 8 hrs as needed for pain. 6)  Allopurinol 300 Mg Tabs (Allopurinol) .[name].. 1 tablet once a day 7)  Klor-con 10 10 Meq Cr-tabs (Potassium chloride) .[name].. 1 tablet twice a day. 8)  Plavix 75 Mg Tabs (Clopidogrel bisulfate) .[name].. 1 tablet daily. 9)  Lisinopril 40 Mg Tabs (Lisinopril) .[name].. 1 tablet once a day. 10)  Biotin 5000 5 Mg Caps (Biotin) .... Take one tablet daily. 11)  Apple Cider Vinegar 300 Mg Tabs (Apple cider vinegar) .... One daily 12)  Calcium 500/d 500-200 Mg-unit Tabs (Calcium carbonate-vitamin d) .... One daily 13)  Vitamin C-acerola 500 Mg Tabs (Ascorbic acid) .... One daily 14)  Lipitor 80 Mg Tabs (Atorvastatin calcium) .... Take one tablet daily. 15)  Polyethylene Glycol 1000 Powd (Polyethylene glycol 1000) .... Use 17g in liquid daily for constipation. 16)  Hydrochlorothiazide 25 Mg Tabs (Hydrochlorothiazide) .... Take one tablet daily. 17)  Fluocinonide 0.05 % Crea (Fluocinonide) .... Apply to affected areas two times a day. 18)  [facility] Chest Congestion Relief Dm 20-400 Mg Tabs (Dextromethorphan-guaifenesin) .... Take one tablet every 6-8 hours as needed for cough.   Patient Instructions: 1)  You have a new prescription for a cough medicine. 2)  You had labs drawn today, we will call you if anything needs to be addressed. 3)  You have refills waiting for you at the drug store.   Prescriptions: KLOR-CON 10 10 MEQ CR-TABS (POTASSIUM CHLORIDE) 1 tablet twice a day.  #64 x 11   Entered and Authorized by:   Elby Showers MD   Signed by:   Elby Showers MD on 12/15/2008   Method used:   [facility] to        [facility] [address] [phone]* (retail)       [address]  [place], [place]  [address]       Ph: [phone] or  [phone]       Fax: [phone]   RxID:   [id number] FLUOCINONIDE 0.05 % CREA (FLUOCINONIDE) Apply to affected areas two times a day.  #1 tube x 3   Entered and Authorized by:   Elby Showers MD   Signed by:   Elby Showers MD on 12/15/2008   Method used:   [facility]        [facility] [address] [phone]* (retail)       [address]       [place], [place]  [address]       Ph: [phone] or [phone]       Fax: [phone]   RxID:   [id number] PLAVIX 75 MG TABS (CLOPIDOGREL BISULFATE) 1 tablet daily.  #32 x 11   Entered and Authorized by:   Elby Showers MD   Signed by:   Elby Showers MD on 12/15/2008   Method used:   [facility]        [facility] [address] [phone]* (retail)       [address]       [place], [place]  [address]       Ph: [phone] or [phone]       Fax: [phone]   RxID:   [id number] [facility] CHEST CONGESTION RELIEF DM 20-400 MG TABS (DEXTROMETHORPHAN-GUAIFENESIN) Take one tablet every 6-8 hours as needed for cough.  #40 x 0   Entered and Authorized by:   Elby Showers MD   Signed by:   Elby Showers MD on 12/15/2008   Method used:   [facility]ron[facility] to        [facility] [address] [phone]* (retail)       [address]       [place], [place]  [address]       Ph: [phone] or [phone]       Fax: [phone]   RxID:   [id number]

## 2010-12-01 NOTE — Progress Notes (Signed)
Summary: REFILL/ HLA  Phone Note Refill Request Message from:  Patient on Mar 25, 2010 4:28 PM  Refills Requested: Medication #1:  nystop powder PLEASE REFER TO [facility], THIS WAS RECENTLY REMOVED AND PT SAYS SHE NEEDS THIS DAILY  Initial call taken by: Marin Roberts RN,  Mar 25, 2010 4:29 PM  Follow-up for Phone Call        Refill approved-nurse to complete    New/Updated Medications: NYSTOP 100000 UNIT/GM POWD (NYSTATIN) Use as directed. Prescriptions: NYSTOP 100000 UNIT/GM POWD (NYSTATIN) Use as directed.  #1 bottle x 6   Entered and Authorized by:   Elby Showers MD   Signed by:   Elby Showers MD on 03/25/2010   Method used:   Telephoned to ...       [facility] (retail)       [address]       [place], [place]  [address]       Ph: [phone]       Fax: [phone]   RxID:   [id number]

## 2010-12-01 NOTE — Progress Notes (Signed)
Summary: REFILLGG  Phone Note Refill Request  on September 21, 2009 11:16 AM  Refills Requested: Medication #1:  HYDROCHLOROTHIAZIDE 25 MG TABS Take one tablet daily.  Medication #2:  LISINOPRIL 40 MG TABS 1 tablet once a day.  Medication #3:  LIPITOR 80 MG TABS Take one tablet daily.  Medication #4:  PREMARIN 0.625 MG/GM CREA Apply intravaginally and externally as directed  Method Requested: Fax to Local Pharmacy Initial call taken by: Merrie Roof RN,  September 21, 2009 11:17 AM  Follow-up for Phone Call        signed. Please call in.  Thank you.    Prescriptions: HYDROCHLOROTHIAZIDE 25 MG TABS (HYDROCHLOROTHIAZIDE) Take one tablet daily.  #32 x 6   Entered and Authorized by:   Elby Showers MD   Signed by:   Elby Showers MD on 09/21/2009   Method used:   Telephoned to ...       [facility] (retail)       [address]       [place], [place]  [address]       Ph: [phone]       Fax: [phone]   RxID:   [id number] PREMARIN 0.625 MG/GM CREA (ESTROGENS, CONJUGATED) Apply intravaginally and externally as directed  #1 tube x 3   Entered and Authorized by:   Elby Showers MD   Signed by:   Elby Showers MD on 09/21/2009   Method used:   Telephoned to ...       [facility] (retail)       [address]       [place], [place]  [address]       Ph: [phone]       Fax: [phone]   RxID:   [id number] LIPITOR 80 MG TABS (ATORVASTATIN CALCIUM) Take one tablet daily.  #32 x 11   Entered and Authorized by:   Elby Showers MD   Signed by:   Elby Showers MD on 09/21/2009   Method used:   Telephoned to ...       [facility] (retail)       [address]       [place], [place]  [address]       Ph: [phone]       Fax: [phone]   RxID:   [id number] LISINOPRIL 40 MG TABS (LISINOPRIL) 1 tablet once a day.  #32 x 6   Entered and Authorized by:   Elby Showers MD   Signed by:   Elby Showers MD on 09/21/2009   Method  used:   Telephoned to ...       [facility] (retail)       [address]       [place], [place]  [address]       Ph: [phone]       Fax: [phone]   RxID:   [id number]

## 2010-12-01 NOTE — Progress Notes (Signed)
  Phone Note Outgoing Call   Call placed by: Theotis Barrio NT II,  February 26, 2009 12:12 PM Call placed to: Patient Details for Reason: DERMATOLOGY APPT Summary of Call: SPOKE WITH MS Snee ABOUT HER APPT WITH THE DERMATOLOGIST / MAY 26, 010  / 2:20PM / DR. Royston Sinner / [facility].[name] LELA STURDIVANT NTII

## 2010-12-01 NOTE — Miscellaneous (Signed)
Summary: [facility]   Imported By: Margie Billet 01/28/2010 10:34:38  _____________________________________________________________________  External Attachment:    Type:   Image     Comment:   External Document

## 2010-12-01 NOTE — Progress Notes (Signed)
Summary: refill/gg  Phone Note Refill Request  on May 31, 2010 4:33 PM  Refills Requested: Medication #1:  OXYBUTYNIN CHLORIDE 5 MG XR24H-TAB Take one tablet at bedtime.  Method Requested: Fax to Local Pharmacy Initial call taken by: Merrie Roof RN,  May 31, 2010 4:33 PM  Follow-up for Phone Call        Has Aug appt. Will refill for 6 months. Follow-up by: Blanch Media MD,  May 31, 2010 5:22 PM    Prescriptions: OXYBUTYNIN CHLORIDE 5 MG XR24H-TAB (OXYBUTYNIN CHLORIDE) Take one tablet at bedtime.  #32 x 5   Entered and Authorized by:   Blanch Media MD   Signed by:   Blanch Media MD on 05/31/2010   Method used:   Faxed to ...       [facility] (retail)       [address]       [place], [place]  [address]       Ph: [phone]       Fax: [phone]   RxID:   [id number]

## 2010-12-01 NOTE — Assessment & Plan Note (Signed)
Summary: FLU/SB.  Nurse Visit   Allergies: 1)  ! Septra 2)  ! Asa  Immunizations Administered:  Influenza Vaccine # 1:    Vaccine Type: Fluvax MCR    Site: right deltoid    Mfr: GlaxoSmithKline    Dose: 0.5 ml    Route: IM    Given by: Angelina Ok RN    Exp. Date: 04/29/2011    Lot #: [id number]    VIS given: 05/24/10 version given July 22, 2010.  Flu Vaccine Consent Questions:    Do you have a history of severe allergic reactions to this vaccine? no    Any prior history of allergic reactions to egg and/or gelatin? no    Do you have a sensitivity to the preservative Thimersol? no    Do you have a past history of Guillan-Barre Syndrome? no    Do you currently have an acute febrile illness? no    Have you ever had a severe reaction to latex? no    Vaccine information given and explained to patient? yes    Are you currently pregnant? no  Orders Added: 1)  Influenza Vaccine MCR [00025]

## 2010-12-01 NOTE — Consult Note (Signed)
Summary: [facility]  [facility]   Imported By: Florinda Marker 10/26/2009 15:43:35  _____________________________________________________________________  External Attachment:    Type:   Image     Comment:   External Document

## 2010-12-01 NOTE — Assessment & Plan Note (Signed)
Summary: CHECKUP/ SB.   Vital Signs:  Patient Profile:   70 Years Old Female Height:     65 inches (165.10 cm) Weight:      235.2 pounds (106.91 kg) BMI:     39.28 Temp:     98.0 degrees F (36.67 degrees C) oral Pulse rate:   96 / minute BP sitting:   139 / 85  (right arm)  Pt. in pain?   yes    Location:   right hip    Intensity:   10  Vitals Entered By: Stanton Kidney Ditzler RN (January 02, 2008 3:19 PM)              Is Patient Diabetic? No Nutritional Status BMI of > 30 = obese Nutritional Status Detail ok  Have you ever been in a relationship where you felt threatened, hurt or afraid?denies   Does patient need assistance? Functional Status Self care Ambulation Impaired:Risk for fall Comments Has a walker. Different people help pt from church.     PCP:  [name] MD  Chief Complaint:  Vag bleeding and itching since on antibiotics. Hyst. Right hip hurts. Falling alot.[name]  History of Present Illness: This is a 70 year old woman with a past medical history of Cervical cancer, hx of (ovarian cancer 1972 oophorectomy) GERD Gout Hypertension Low back pain,  Sec to DDD and DJD Osteoarthritis - both hips Transient ischemic attack, hx of Sensorineural hearing loss ( bilateral w/chronic L ear pain) CPAP Mitral valve prolapse Chronic venous insufficiency Diverticular Disease Hyperlipidemia Gout   who is coming today for a follow-up on her vaginal discharge and itching.  SHe says she used the cream (nystatin) but without much success.  She says her external genitals feels very very dry, as well as the inside of her vagina.  She used to be on premarin supplements but was taken off of it recently which has exacerbated her symptoms.  She also has not taken her metronidazole ( wasn't aware of this). She is also complaining of back pain/right hip pain, for the past 20 years.  She keeps saying she thinks it's her hip, even though her orthopedist has told her multiple times it is  her back.  She also states that she has been getting some kind of back injection by [facility] for many years now which actually help her "hip pain", but still thinks it's her hip that is the problem.  She says she used to have relief for about 6 months with one injection in the past, but now only has 2-3 weeks relief.  She says she falls a lot because of her pain.  She knows she has to lose weight and do exercises to get better, knows that she felt better when she went to the [facility], in the pool, but just doesn't want to do it because she feels "too disabled", and can't get herself out of the house because of this.  However, she still drives her car and goes shopping!    Current Allergies (reviewed today): ! SEPTRA ! ASA    Risk Factors: Tobacco use:  quit    Year quit:  1999 Alcohol use:  no Exercise:  no Seatbelt use:  100 %  Mammogram History:    Date of Last Mammogram:  12/18/2006   Review of Systems  General      Denies chills and fever.  CV      Denies chest pain or discomfort and shortness of breath with exertion.  GI      Denies abdominal pain and change in bowel habits.  GU      See HPI  MS      See HPI  Neuro      Complains of falling down and poor balance.      Denies numbness and weakness.   Physical Exam  General:     alert, well-developed, well-nourished, and well-hydrated.   Head:     normocephalic and atraumatic.   Eyes:     vision grossly intact, pupils equal, pupils round, and pupils reactive to light.   Lungs:     Normal respiratory effort, chest expands symmetrically. Lungs are clear to auscultation, no crackles or wheezes. Heart:     Normal rate and regular rhythm. S1 and S2 normal without gallop, murmur, click, rub or other extra sounds. Abdomen:     Bowel sounds positive,abdomen soft and non-tender without masses, organomegaly or hernias noted.    Impression & Recommendations:  Problem # 1:  LOW BACK PAIN (ICD-724.2) She  was counselled at lenght about the benefits of exercising, especially water exercises for her, but wasn't very responsive to this.  She keeps saying she can't walk so she can't get herself to the pool, however she drove herself to the clinic and does her own shopping. We have referred her to the free evaluation for elderly patients with falls, as well as neurosurgery, as her injections don't seem to be helping as well as before.  She would like to talk about surgery. Her updated medication list for this problem includes:    Vicodin 5-500 Mg Tabs (Hydrocodone-acetaminophen) .[name]... Take 1 tab every 4-6 hrs as needed    Tylenol 325 Mg Tabs (Acetaminophen) .[name]... Take 2 tablet by mouth four times a day for pain    Piroxicam 20 Mg Caps (Piroxicam) .[name]... Take 1 tablet by mouth once a day  Orders: Neurosurgeon Referral (Neurosurgeon)   Problem # 2:  HYPERTENSION (ICD-401.9) Good control.  Continue as is.  Her updated medication list for this problem includes:    Lisinopril 40 Mg Tabs (Lisinopril) .[name]... Take 1 tablet by mouth once a day    Caduet 5-80 Mg Tabs (Amlodipine-atorvastatin) .[name]... Take 1 tablet by mouth once a day    Hydrochlorothiazide 25 Mg Tabs (Hydrochlorothiazide) .[name]... Take 1 tablet by mouth once a day  BP today: 139/85 Prior BP: 136/75 (11/28/2007)  Labs Reviewed: Creat: 1.26 (11/20/2007) Chol: 190 (12/20/2006)   HDL: 57 (12/20/2006)   LDL: 105 (12/20/2006)   TG: 139 (12/20/2006)   Problem # 3:  VAGINITIS (ICD-616.10) 2-fold : untreated bacterial vaginosis.  I gave her another prescription for the metronidazole so she has it in hand and doesn't forget about it.  I also think she has atrophic vaginitis, and will do a trial of premarin cream to see if it helps.  If it still doesn't help, we may need to send her to a GYN for biopsy of her vulva (?cancer?) The following medications were removed from the medication list:    Ciprofloxacin Hcl 250 Mg Tabs (Ciprofloxacin hcl) .[name]... Take  one tablet two times a day for three days.  Her updated medication list for this problem includes:    Metronidazole 500 Mg Tabs (Metronidazole) .[name]... Take one tablet two times a day for 7 days.   Complete Medication List: 1)  Amitriptyline Hcl 50 Mg Tabs (Amitriptyline hcl) .... Take 1 tablet by mouth at bedtime 2)  Allopurinol 300 Mg Tabs (Allopurinol) .... Take  1 tablet by mouth once a day 3)  Klor-con 10 10 Meq Tbcr (Potassium chloride) .... Take 1 tablet by mouth two times a day 4)  Plavix 75 Mg Tabs (Clopidogrel bisulfate) .... Take 1 tablet by mouth once a day 5)  Lisinopril 40 Mg Tabs (Lisinopril) .... Take 1 tablet by mouth once a day 6)  Protonix 40 Mg Tbec (Pantoprazole sodium) .... Take 1 tablet by mouth once a day 7)  Vicodin 5-500 Mg Tabs (Hydrocodone-acetaminophen) .... Take 1 tab every 4-6 hrs as needed 8)  Detrol La 4 Mg Cp24 (Tolterodine tartrate) .... Take 1 tablet by mouth once at bedtime 9)  Lactulose Soln (Lactulose soln) .... Take 30ml till yo have bm, repeat 4 times in a day. 10)  Wellbutrin Sr 150 Mg Tb12 (Bupropion hcl) .... Take one pill every day for three days, then start taking one pill twice daily. 11)  Tylenol 325 Mg Tabs (Acetaminophen) .... Take 2 tablet by mouth four times a day for pain 12)  Caduet 5-80 Mg Tabs (Amlodipine-atorvastatin) .... Take 1 tablet by mouth once a day 13)  Hydrochlorothiazide 25 Mg Tabs (Hydrochlorothiazide) .... Take 1 tablet by mouth once a day 14)  Piroxicam 20 Mg Caps (Piroxicam) .... Take 1 tablet by mouth once a day 15)  Benzonatate 100 Mg Caps (Benzonatate) .... Take one capsule three times a day for 10 days. 16)  Metronidazole 500 Mg Tabs (Metronidazole) .... Take one tablet two times a day for 7 days. 17)  Premarin 0.625 Mg/gm Crea (Estrogens, conjugated) .[name].. 1g application intravaginally and externally daily   Patient Instructions: 1)  Please schedule a follow-up appointment as needed with your regular physician. 2)   You NEED to exercise, you should do pool exercises.    Prescriptions: METRONIDAZOLE 500 MG  TABS (METRONIDAZOLE) Take one tablet two times a day for 7 days.  #14 x 0   Entered and Authorized by:   Dellia Beckwith MD   Signed by:   Dellia Beckwith MD on 01/02/2008   Method used:   Print then Give to Patient   RxID:   [id number] PREMARIN 0.625 MG/GM  CREA (ESTROGENS, CONJUGATED) 1g application intravaginally and externally daily  #42.5g tube x 12   Entered and Authorized by:   Dellia Beckwith MD   Signed by:   Dellia Beckwith MD on 01/02/2008   Method used:   Print then Give to Patient   RxID:   [id number]  ]

## 2010-12-01 NOTE — Progress Notes (Signed)
Summary: refill/ hla  Phone Note Refill Request Message from:  Fax from Pharmacy on May 16, 2007 2:17 PM  Refills Requested: Medication #1:  KLOR-CON 10 10 MEQ TBCR Take 1 tablet by mouth two times a day   Last Refilled: 03/23/2007 Initial call taken by: Marin Roberts RN,  May 16, 2007 2:17 PM  Follow-up for Phone Call        Refill approved-nurse to complete Follow-up by: Manning Charity MD,  May 16, 2007 2:24 PM  Additional Follow-up for Phone Call Additional follow up Details #1::        Rx faxed to pharmacy Additional Follow-up by: Marin Roberts RN,  May 16, 2007 3:29 PM     Prescriptions: KLOR-CON 10 10 MEQ TBCR (POTASSIUM CHLORIDE) Take 1 tablet by mouth two times a day  #62 x 2   Entered and Authorized by:   Manning Charity MD   Signed by:   Manning Charity MD on 05/16/2007   Method used:   Telephoned to ...       [facility]       [address]       [place], [place]  [address]       Ph: [phone]       Fax: [phone]   RxID:   [id number]

## 2010-12-01 NOTE — Miscellaneous (Signed)
Summary: hospital admission (Left AMA)  [name] was admitted in the hospital for malena and hard stools. GI saw her in the hospital and adivsed the she would benifit from colonsocpy but patient left AMA as she did not want any one else to perform colonoscopy except [name]. Will schedule an appoinemtn in the clinic in1 week ([name] at 2:30 pm)  for repeat CBC check. Schedule a colonscopy outpatient  with [name] when she comes to the outpatient clinic. Her Hb at discharge was 9.8  Appended Document: hospital admission (Left AMA) had a long discussion with the patient regarding the fall in Hb from 11 to 9 gms while in the hospital. She is still reluctant to come to the hosptial for readmssion. She has an a appointment with [name] in 1 week. HAve asked her to returnto the ED if she is dizzy, light headed, having chest pain or other significant complaints.

## 2010-12-01 NOTE — Progress Notes (Signed)
Summary: Refill request/dms  Phone Note Refill Request Message from:  Fax from Pharmacy on February 25, 2007 9:29 AM  Refills Requested: Medication #1:  PROTONIX 40 MG TBEC Take 1 tablet by mouth once a day   Dosage confirmed as above?Dosage Confirmed   Brand Name Necessary? No   Last Refilled: 01/17/2007  Method Requested: Fax to Local Pharmacy Initial call taken by: Henderson Cloud,  February 25, 2007 9:30 AM  Follow-up for Phone Call        Refill approved-nurse to complete Follow-up by: Eliseo Gum MD,  February 25, 2007 11:35 AM  Additional Follow-up for Phone Call Additional follow up Details #1::        Rx faxed to pharmacy Additional Follow-up by: Henderson Cloud,  February 25, 2007 11:43 AM    Prescriptions: PROTONIX 40 MG TBEC (PANTOPRAZOLE SODIUM) Take 1 tablet by mouth once a day  #31 x 5   Entered and Authorized by:   Eliseo Gum MD   Signed by:   Eliseo Gum MD on 02/25/2007   Method used:   Telephoned to ...       [facility]       [address]       [place], [place]  [address]  [place]       Ph: [phone]       Fax: [phone]   RxID:   [id number]

## 2010-12-01 NOTE — Consult Note (Signed)
Summary: [facility] : Dermatology Exam  [facility] : Dermatology Exam   Imported By: Florinda Marker 01/22/2008 15:09:12  _____________________________________________________________________  External Attachment:    Type:   Image     Comment:   External Document

## 2010-12-01 NOTE — Progress Notes (Signed)
Summary: message, home eval/ hla  Phone Note Call from Patient   Summary of Call: pt called and left message as to why advanced was coming out to her home, i rtc, got voicemail, left message for pt to call clinic back Initial call taken by: Marin Roberts RN,  June 23, 2010 2:08 PM  Follow-up for Phone Call        spoke w/ pt and explained why the home eval was needed, she is agreeable Follow-up by: Marin Roberts RN,  June 23, 2010 4:34 PM

## 2010-12-01 NOTE — Miscellaneous (Signed)
Summary: D/Charge  D/Charge   Imported By: Florinda Marker 04/28/2009 14:11:22  _____________________________________________________________________  External Attachment:    Type:   Image     Comment:   External Document

## 2010-12-01 NOTE — Progress Notes (Signed)
Summary: refill/gh  Phone Note Refill Request Message from:  Fax from Pharmacy on November 29, 2006 3:37 PM  Refills Requested: Medication #1:  ALLOPURINOL 300 MG TABS Take 1 tablet by mouth once a day   Last Refilled: 10/11/2006 Initial call taken by: Angelina Ok RN,  November 29, 2006 3:38 PM  Follow-up for Phone Call        Pt does not have definitive diagnosis of gout. I don't think she needs allopurinol. However I will route this refill to the patient's PCP who is Dr. Allena Katz to decide if patient needs to take allopurinol. ..................................................................Marland KitchenEllie Lunch MD  November 29, 2006 3:51 PM   Additional Follow-up for Phone Call Additional follow up Details #1::        Chart to DR.Patel. Additional Follow-up by: Angelina Ok RN,  November 29, 2006 5:19 PM    Prescriptions: ALLOPURINOL 300 MG TABS (ALLOPURINOL) Take 1 tablet by mouth once a day  #30 x 2   Entered by:   Angelina Ok RN   Authorized by:   Artist Beach MD   Signed by:   Angelina Ok RN on 12/03/2006   Method used:   Telephoned to ...       [facility]       [address]       [place], [place]  [address]  [place]       Ph: [phone]       Fax: [phone]   RxID:   [id number]

## 2010-12-01 NOTE — Progress Notes (Signed)
Summary: K+/ hla  Phone Note Call from Patient   Summary of Call: pt called to say she did not have any K+ and the pharmacy said dhe didn't have a script, PPA notified, michelle states that PPA did not receive the fax, script given to [facility], she states will be delivered to pt tomorrow. pt informed. Initial call taken by: Marin Roberts RN,  June 27, 2010 1:48 PM

## 2010-12-01 NOTE — Consult Note (Signed)
Summary: [facility]: [facility]   Imported By: Louretta Parma 07/03/2007 08:42:01  _____________________________________________________________________  External Attachment:    Type:   Image     Comment:   External Document

## 2010-12-01 NOTE — Progress Notes (Signed)
Summary: Refill/gh  Phone Note Refill Request Message from:  Fax from Pharmacy on Mar 01, 2010 2:59 PM  Refills Requested: Medication #1:  LORATADINE 10 MG TABS Take one tablet daily for sinuses..  Method Requested: Electronic Initial call taken by: Angelina Ok RN,  Mar 01, 2010 2:59 PM  Follow-up for Phone Call        Refill approved-nurse to complete    Prescriptions: LORATADINE 10 MG TABS (LORATADINE) Take one tablet daily for sinuses.  #30 x 3   Entered and Authorized by:   Elby Showers MD   Signed by:   Elby Showers MD on 03/02/2010   Method used:   Telephoned to ...       [facility]ail)       [address]       [place], [place]  [address]       Ph: [phone]       Fax: [phone]   RxID:   [id number]

## 2010-12-01 NOTE — Progress Notes (Signed)
Summary: transfer bench?/ hla  Phone Note Call from Patient   Summary of Call: pt states she needs a transfer bench to get in an out of tub, maybe a home eval would be good? will send this to donna t. also. Initial call taken by: Marin Roberts RN,  June 10, 2010 5:31 PM  Follow-up for Phone Call        I agree a home evaluation would be a good call for her.  I'll order the social work consult for [employer].  Follow-up by: Leodis Sias MD,  June 12, 2010 10:14 AM
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
## 2010-12-01 NOTE — Assessment & Plan Note (Signed)
Summary: FLU SHOT/CFB  Nurse Visit   Allergies: 1)  ! Septra 2)  ! Asa  Immunizations Administered:  Influenza Vaccine # 1:    Vaccine Type: Fluvax MCR    Site: left deltoid    Mfr: Novartis    Dose: 0.5 ml    Route: IM    Given by: Angelina Ok RN    Exp. Date: 12/28/2009    Lot #: [id number]    VIS given: 05/23/07 version given July 26, 2009.  Flu Vaccine Consent Questions:    Do you have a history of severe allergic reactions to this vaccine? no    Any prior history of allergic reactions to egg and/or gelatin? no    Do you have a sensitivity to the preservative Thimersol? no    Do you have a past history of Guillan-Barre Syndrome? no    Do you currently have an acute febrile illness? no    Have you ever had a severe reaction to latex? no    Vaccine information given and explained to patient? yes    Are you currently pregnant? no  Orders Added: 1)  Influenza Vaccine MCR [00025]

## 2010-12-01 NOTE — Progress Notes (Signed)
Summary: appt/ hla  Phone Note Call from Patient   Summary of Call: pt calls to ask for appt due to the fact that she "fell out" at physical therapy on wed 4/6, she refused to be taken to the ed. c/o hot/cold spells and has no energy to do anything, does not cook, does not walk in yard. she also has black tarry stools then she will have clay colored stools. she desires an appt. given for 4/12. she also has stabbing pains in her hips. Initial call taken by: Marin Roberts RN,  February 07, 2010 10:27 AM

## 2010-12-01 NOTE — Progress Notes (Signed)
Summary: med refill/gp  Phone Note Refill Request Message from:  Fax from Pharmacy on April 16, 2008 11:23 AM  Refills Requested: Medication #1:  CADUET 5-80 MG  TABS Take 1 tablet by mouth once a day   Last Refilled: 03/02/2008  Method Requested: electronic Initial call taken by: Chinita Pester RN,  April 16, 2008 11:23 AM  Follow-up for Phone Call        Prescription sent electronically.  Please notify patient.  Follow-up by: Ruben Reason. Candis Musa MD,  April 17, 2008 12:35 PM      Prescriptions: CADUET 5-80 MG  TABS (AMLODIPINE-ATORVASTATIN) Take 1 tablet by mouth once a day  #30 x 12   Entered and Authorized by:   Ruben Reason. Candis Musa MD   Signed by:   Ruben Reason. Candis Musa MD on 04/17/2008   Method used:   Electronically sent to ...       [facility] [address] [phone]*       [address]       [place], [place]  [address]       Ph: [phone] or [phone]       Fax: [phone]   RxID:   [id number]

## 2010-12-01 NOTE — Assessment & Plan Note (Signed)
Summary: FLU/ SB.  Nurse Visit    Prior Medications: PROTONIX 40 MG TBEC (PANTOPRAZOLE SODIUM) Take 1 tablet by mouth once a day DETROL LA 4 MG CP24 (TOLTERODINE TARTRATE) Take 1 tablet by mouth once at bedtime LACTULOSE  SOLN (LACTULOSE SOLN) Take 30ml till yo have BM, repeat 4 times in a day. WELLBUTRIN SR 150 MG TB12 (BUPROPION HCL) Take one pill every day for three days, then start taking one pill twice daily. TYLENOL 325 MG TABS (ACETAMINOPHEN) Take 2 tablet by mouth four times a day for pain CADUET 5-80 MG  TABS (AMLODIPINE-ATORVASTATIN) Take 1 tablet by mouth once a day HYDROCHLOROTHIAZIDE 25 MG  TABS (HYDROCHLOROTHIAZIDE) Take 1 tablet by mouth once a day NYAMYC 100000 UNIT/GM  POWD (NYSTATIN) Apply to dry skin twice a day CLOTRIMAZOLE ANTI-FUNGAL 1 %  CREA (CLOTRIMAZOLE) Apply to the area 3-4 times per day DARVON-N 100 MG  TABS (PROPOXYPHENE NAPSYLATE) Take 1 tablet by mouth every four hours as needed for pain DIFLUCAN 100 MG  TABS (FLUCONAZOLE) Take 1 tablet by mouth once a day PIROXICAM 20 MG  CAPS (PIROXICAM) Take one tablet daily. AMITRIPTYLINE HCL 50 MG TABS (AMITRIPTYLINE HCL) 1 tablet at bed time. ALLOPURINOL 300 MG TABS (ALLOPURINOL) 1 tablet once a day KLOR-CON 10 10 MEQ CR-TABS (POTASSIUM CHLORIDE) 1 tablet twice a day. PLAVIX 75 MG TABS (CLOPIDOGREL BISULFATE) 1 tablet daily. LISINOPRIL 40 MG TABS (LISINOPRIL) 1 tablet once a day. DARVON-N 100 MG TABS (PROPOXYPHENE NAPSYLATE) 1 tablet every 4 hour as needed. FUROSEMIDE 40 MG TABS (FUROSEMIDE) Take one tablet daily for 4 days for leg swelling. GUAIFENESIN 200 MG TABS (GUAIFENESIN) Take one pill every 4 hours as needed for coughing Current Allergies: ! SEPTRA ! ASA  Flu Vaccine Consent Questions     Do you have a history of severe allergic reactions to this vaccine? no    Any prior history of allergic reactions to egg and/or gelatin? no    Do you have a sensitivity to the preservative Thimersol? no    Do you have  a past history of Guillan-Barre Syndrome? no    Do you currently have an acute febrile illness? no    Have you ever had a severe reaction to latex? no    Vaccine information given and explained to patient? yes    Are you currently pregnant? no    Lot Number:[id number]    Expiration date 04/28/2008   Site Given  Left Deltoid IM    Orders Added: 1)  Flu Vaccine 27yrs + [04540] 2)  Administration Flu vaccine [G0008]    ]

## 2010-12-01 NOTE — Progress Notes (Signed)
Summary: refill/ hla  Phone Note Refill Request Message from:  Fax from Pharmacy on February 01, 2007 1:56 PM  Refills Requested: Medication #1:  TAZTIA XT 180 MG CP24 Take 1 tablet by mouth once a day Initial call taken by: Marin Roberts RN,  February 01, 2007 1:56 PM  Follow-up for Phone Call        Refill approved-nurse to complete Follow-up by: Eliseo Gum MD,  February 01, 2007 3:29 PM  Additional Follow-up for Phone Call Additional follow up Details #1::        Rx faxed to pharmacy Additional Follow-up by: Marin Roberts RN,  February 01, 2007 4:12 PM    Prescriptions: TAZTIA XT 180 MG CP24 (DILTIAZEM HCL ER BEADS) Take 1 tablet by mouth once a day  #31 x 5   Entered and Authorized by:   Eliseo Gum MD   Signed by:   Eliseo Gum MD on 02/01/2007   Method used:   Telephoned to ...       [facility]       [address]       [place], [place]  [address]  [place]       Ph: [phone]       Fax: [phone]   RxID:   [id number]
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
## 2010-12-01 NOTE — Progress Notes (Signed)
Summary: phone/gg  Phone Note Call from Patient   Caller: Patient Summary of Call: Pt called stating she is not feeling well.  C/o dizziness, weakness, sore throat, area  behind ear  and left eye feels sore.  She  feels hot but has not taken temp.  taking tylenol and drinking. Onset Friday night. Can you send some med to home?  If EMS comes pt will not have a place to come back to, she lives in [place] area and she has no way home She lives alone, no $$ for gas.  She will come in when she gets better. Pt # [id number] Initial call taken by: Merrie Roof RN,  January 24, 2010 1:24 PM  Follow-up for Phone Call        I spoke to [facility] and reviewed Dr Tressie Stalker prior notes with multiple complaints.  Will rec otc cold meds for sinus congestion and pain (tylenol cold and sinus).  can you  try to call pharmacy to see if they will deliver (or if there is an equivalent rx med that they will deliver Follow-up by: Clydie Braun MD,  January 24, 2010 2:23 PM  Additional Follow-up for Phone Call Additional follow up Details #1::        Pharmacy does not deliver OTC meds. This was explained to pt and appointment was offered.  Yekaterina will call the church and see if she can get a ride in today to be seen. Additional Follow-up by: Merrie Roof RN,  January 25, 2010 9:15 AM

## 2010-12-01 NOTE — Progress Notes (Signed)
Summary: refill/ hla  Phone Note Refill Request Message from:  Fax from Pharmacy on Mar 29, 2007 10:01 AM  Refills Requested: Medication #1:  ALLOPURINOL 300 MG TABS Take 1 tablet by mouth once a day   Last Refilled: 4/26  Medication #2:  LISINOPRIL 40 MG TABS Take 1 tablet by mouth once a day   Last Refilled: 4/26  Medication #3:  DETROL LA 4 MG CP24 Take 1 tablet by mouth once at bedtime   Last Refilled: 4/26 Initial call taken by: Marin Roberts RN,  Mar 29, 2007 10:02 AM  Follow-up for Phone Call        Refill approved-nurse to complete.  Patient is on allopurinol, and should have a CBC with differential and liver enzymes followed periodically.  I entered orders for a CBC and comprehensive metabolic panel; please schedule a lab visit within the next month. Follow-up by: Margarito Liner MD,  Mar 29, 2007 11:21 AM  Additional Follow-up for Phone Call Additional follow up Details #1::        Rx faxed to pharmacy flag to [facility]bo[facility] for appt Additional Follow-up by: Marin Roberts RN,  Mar 29, 2007 4:56 PM    Prescriptions: ALLOPURINOL 300 MG TABS (ALLOPURINOL) Take 1 tablet by mouth once a day  #30 x 0   Entered and Authorized by:   Margarito Liner MD   Signed by:   Margarito Liner MD on 03/29/2007   Method used:   Telephoned to ...       [facility]       [address]       [place], [place]  [address]       Ph: [phone] or [phone]       Fax: [phone]   RxID:   [id number] DETROL LA 4 MG CP24 (TOLTERODINE TARTRATE) Take 1 tablet by mouth once at bedtime  #30 x 2   Entered and Authorized by:   Margarito Liner MD   Signed by:   Margarito Liner MD on 03/29/2007   Method used:   Telephoned to ...       [facility]       [address]       [place], [place]  [address]       Ph: [phone] or [phone]       Fax: [phone]   RxID:   [id number] LISINOPRIL 40 MG TABS (LISINOPRIL)  Take 1 tablet by mouth once a day  #30 x 2   Entered and Authorized by:   Margarito Liner MD   Signed by:   Margarito Liner MD on 03/29/2007   Method used:   Telephoned to ...       [facility]       [address]       [place], [place]  [address]       Ph: [phone] or [phone]       Fax: [phone]   RxID:   [id number]
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
## 2010-12-01 NOTE — Progress Notes (Signed)
Summary: refill/ hla  Phone Note Refill Request Message from:  Patient on June 08, 2010 11:16 AM  Refills Requested: Medication #1:  potassium chloride cr   Dosage confirmed as above?Dosage Confirmed   Last Refilled: 7/1 pt states she has been taking this medication and needs a refill, chart shows d'cd 01/2010, according to [facility] pt was sent K+ july ,1, 2011, they also state she has refused delivery of the lasix, last time delivered was 6/1 #30.  Initial call taken by: Marin Roberts RN,  June 08, 2010 11:18 AM  Follow-up for Phone Call        Called and talked to patient.  She has been taking it and never stopped.  Last K level was 4.3.  We will have her only take the one 10 mEq tablet now.  She also didn't refill her lasix for the last month because we cut her down to once per day and the last one was filled for twice daily. Follow-up by: Leodis Sias MD,  June 08, 2010 2:29 PM    New/Updated Medications: POTASSIUM CHLORIDE CR 10 MEQ CR-TABS (POTASSIUM CHLORIDE) Take 2 tablet by mouth once a day. POTASSIUM CHLORIDE CR 10 MEQ CR-TABS (POTASSIUM CHLORIDE) Take 1 tablet by mouth once a day Prescriptions: POTASSIUM CHLORIDE CR 10 MEQ CR-TABS (POTASSIUM CHLORIDE) Take 1 tablet by mouth once a day  #30 x 11   Entered and Authorized by:   Leodis Sias MD   Signed by:   Leodis Sias MD on 06/08/2010   Method used:   Faxed to ...       [facility] (retail)       [address]       [place], [place]  [address]       Ph: [phone]       Fax: [phone]   RxID:   [id number] POTASSIUM CHLORIDE CR 10 MEQ CR-TABS (POTASSIUM CHLORIDE) Take 2 tablet by mouth once a day.  #60 x 11   Entered and Authorized by:   Leodis Sias MD   Signed by:   Leodis Sias MD on 06/08/2010   Method used:   Faxed to ...       [facility] (retail)       [address]       [place], [place]  [address]       Ph:  [phone]       Fax: [phone]   RxID:   [id number]   Appended Document: refill/ hla Clarification called to [facility], per note from Dr Tonny Branch Kcl 10 meq once a day

## 2010-12-01 NOTE — Letter (Signed)
Summary: [facility]: Dr. Clint Lipps  [facility]: Dr. Clint Lipps   Imported By: Florinda Marker 04/04/2007 14:53:13  _____________________________________________________________________  External Attachment:    Type:   Image     Comment:   External Document

## 2010-12-01 NOTE — Progress Notes (Signed)
Summary: phone/ hla  Phone Note Call from Patient   Summary of Call: pt called to talk, nothing more Initial call taken by: Marin Roberts RN,  July 14, 2010 4:39 PM

## 2010-12-01 NOTE — Progress Notes (Signed)
  Phone Note Call from Patient   Summary of Call: After hours call from patient- prescriptions not at pharmacy. I electronically sent Lidoderm Patch, Clotrimazole, and Premarin refills per Dr. Claris Che clinic note today and [facility] Initial call taken by: Julaine Fusi  DO,  February 23, 2009 10:31 PM
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
## 2010-12-01 NOTE — Progress Notes (Signed)
       New/Updated Medications: OMEPRAZOLE 40 MG CPDR (OMEPRAZOLE) Take one tablet daily. Patient called several times wanting a prior authorization done for her Protonix.  [employer], the plan will pay for brand name Protonix for $6.00 but will not cover the generic. I changed the medication over to the brand name.  Called placed to Mrs. Olberding letting her know of the change.  She acknowlege understanding. However, patient called back to the triage nurse several times stating that she could afford the $6.00 medication for variuos reasons and didn't want "Marcelle Smiling" to do her medication. She insisted on being on the $2.00 medication (omperazole) so I changed her medication to the Omperazole 20mg  once daily.  Please update medlist for me, thanks  Prescriptions: OMEPRAZOLE 40 MG CPDR (OMEPRAZOLE) Take one tablet daily.  #31 x 6   Entered and Authorized by:   Elby Showers MD   Signed by:   Elby Showers MD on 11/27/2008   Method used:   Electronically to        [facility] [phone][facility] (retail)       [address]       [place], [place]  [address]       Ph: [phone] or [phone]       Fax: [phone]   RxID:   [id number]
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
## 2010-12-01 NOTE — Medication Information (Signed)
Summary: BED SIDE COMODE /LIFT CHAIR  BED SIDE COMODE /LIFT CHAIR   Imported By: Margie Billet 03/14/2010 11:03:12  _____________________________________________________________________  External Attachment:    Type:   Image     Comment:   External Document

## 2010-12-01 NOTE — Medication Information (Signed)
Summary: FUROSEMIDE/ANUCORT  FUROSEMIDE/ANUCORT   Imported By: Margie Billet 03/11/2010 11:56:55  _____________________________________________________________________  External Attachment:    Type:   Image     Comment:   External Document

## 2010-12-01 NOTE — Assessment & Plan Note (Signed)
Summary: checkup/pcp-Sanah Kraska/hla   Vital Signs:  Patient profile:   70 year old female Height:      65 inches (165.10 cm) Weight:      236.7 pounds (107.59 kg) BMI:     39.53 Temp:     98.4 degrees F (36.89 degrees C) oral Pulse rate:   61 / minute BP sitting:   142 / 66  (right arm)  Vitals Entered By: Stanton Kidney Ditzler RN (August 26, 2010 3:06 PM) Is Patient Diabetic? No Pain Assessment Patient in pain? no      Nutritional Status BMI of > 30 = obese Nutritional Status Detail appetite down  Have you ever been in a relationship where you felt threatened, hurt or afraid?denies   Does patient need assistance? Functional Status Self care Ambulation Impaired:Risk for fall Comments Uses a walker. Refills on meds. Discuss diabetes - feet hurt all the time.   Primary Care Provider:  Leodis Sias MD   History of Present Illness: The patient is a 70 year old woman who presents today for refills and follow up from her last clinic visit.  She has been able to take her medications and has no side effects from them.   Of note since my last visit with her she was hospitalized for GI bleed and was seen by [facility] and had a colonoscopy and EGD done which she states showed some irritation in her stomach.  I have a copy of the clinic not but not a copy of the scopes.  She will have [facility] forward a copy of the colonoscopy and EGD to [place] for our records.  The only complaint she has is that she has some new shin pain.  She is concerned because her mother had diabetes and had bilateral amputations because of that and she does not want that to happen to her.  The pain is over the mid lower leg in the center and a bit to the lateral side of the tibia bilaterally.  She states that the pain is worse with walking and has gotted worse since she bought her new pair of shoes.  The pain does not radiate anywhere and gets better if she sits and rests.   Depression History:      The patient denies a  depressed mood most of the day and a diminished interest in her usual daily activities.        The patient denies that she feels like life is not worth living, denies that she wishes that she were dead, and denies that she has thought about ending her life.         Preventive Screening-Counseling & Management  Alcohol-Tobacco     Alcohol drinks/day: 0     Smoking Status: quit     Year Quit: 1999     Pack years: 50     Passive Smoke Exposure: yes  Caffeine-Diet-Exercise     Does Patient Exercise: no  Current Medications (verified): 1)  Protonix 40 Mg Pack (Pantoprazole Sodium) .... Take One Tablet Daily. 2)  Oxybutynin Chloride 5 Mg Xr24h-Tab (Oxybutynin Chloride) .... Take One Tablet At Bedtime. 3)  Amlodipine Besylate 5 Mg Tabs (Amlodipine Besylate) .... Take One Tablet Daily. 4)  Clotrimazole Anti-Fungal 1 %  Crea (Clotrimazole) .... Apply To The Area 3-4 Times Per Day 5)  Allopurinol 300 Mg Tabs (Allopurinol) .[name].. 1 Tablet Once A Day 6)  Plavix 75 Mg Tabs (Clopidogrel Bisulfate) .[name].. 1 Tablet Daily. 7)  Lisinopril 40 Mg Tabs (  Lisinopril) .[name].. 1 Tablet Once A Day. 8)  Biotin 5000 5 Mg Caps (Biotin) .... Take One Tablet Daily. 9)  Apple Cider Vinegar 300 Mg Tabs (Apple Cider Vinegar) .... One Daily 10)  Calcium 500/d 500-200 Mg-Unit Tabs (Calcium Carbonate-Vitamin D) .... One Daily 11)  Vitamin C-Acerola 500 Mg Tabs (Ascorbic Acid) .... One Daily 12)  Lipitor 80 Mg Tabs (Atorvastatin Calcium) .... Take One Tablet Daily. 13)  Polyethylene Glycol 1000  Powd (Polyethylene Glycol 1000) .... Use 17g in Liquid Daily For Constipation. 14)  Furosemide 20 Mg Tabs (Furosemide) .... Take 1 Tablet By Mouth Once A Day For Swelling 15)  Fluocinonide 0.05 % Crea (Fluocinonide) .... Apply To Affected Areas Two Times A Day. 16)  Premarin 0.625 Mg/gm Crea (Estrogens, Conjugated) .... Apply Intravaginally and Externally As Directed 17)  Cetirizine Hcl 10 Mg Tabs (Cetirizine Hcl) .... Take 1 Tablet By  Mouth Once A Day For Itching 18)  Nystop 100000 Unit/gm Powd (Nystatin) .... Use As Directed. 19)  Relief Medical Leg Knee High  Misc (Elastic Bandages & Supports) .... Ted Compression Support Hose 20)  Triamcinolone Acetonide 0.1 % Oint (Triamcinolone Acetonide) .... Apply To Affected Area Two Times A Day 21)  Potassium Chloride Cr 10 Meq Cr-Tabs (Potassium Chloride) .... Take 1 Tablet By Mouth Once A Day  Allergies: 1)  ! Septra 2)  ! Asa  Past History:  Past medical, surgical, family and social histories (including risk factors) reviewed for relevance to current acute and chronic problems.  Past Medical History: Reviewed history from 02/14/2008 and no changes required. Cervical cancer, hx of (ovarian cancer 1972 oophorectomy) GERD Gout Hypertension Low back pain,  Sec to DDD and DJD Osteoarthritis - both hips Transient ischemic attack, hx of Sensorineural hearing loss ( bilateral w/chronic L ear pain) CPAP Mitral valve prolapse Chronic venous insufficiency Diverticular Disease Hyperlipidemia Boils Gout  Past Surgical History: Reviewed history from 09/03/2006 and no changes required. Total hip replacement -Both Hip 2005 and 2006 Hysterectomy 1972 Oophorectomy 1972 DJD- L5-S1 s/p sx C5-C6 Diskectomy 07/07  Family History: Reviewed history and no changes required. Mom: Diabetes, bilateral BKA.  Social History: Reviewed history from 09/29/2008 and no changes required. 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    Physical Exam  Additional Exam:  General: Well developed, female in no acute distress.  Vital signs reviewed.  Head: Atraumatic, normocephalic with no signs of trauma  Eyes: PERRLA, EOM intact  Ears: TM intact  Nose: Nares patent, mucosa is pink and moist, no polyps noted  Mouth: Mucosa is pink and moist, dention,   Neck: Supple, full ROM, no thyromegaly or masses noted  Resp: Clear to ascultation bilaterally, no wheezes, rales, or rhonchi noted  CV: Regular rate and rhythm with no murmurs, rubs, or gallops noted  Abdomen: Soft, non-tender, non-distended with normal bowel sounds  Musculoskelatal: ROM full with intact strength, Pain to palpation of the lateral portion of the mid tibia bilaterally that is worse with plantar flexion of the foot.  There is no erythema or swelling. Neurologic: Alert and oriented x3, Cranial nerves II-XII grossly intact, DTR normal and symmetric  Pulses:  Radial, brachial, carotid, femoral, dorsal pedis, and posterior tibial pulses equal and symmetric     Impression & Recommendations:  Problem # 1:  SHIN SPLINTS (ICD-844.9) Her leg pain is likely due to Shin splints due to the new round bottom shoes.  She was advised that she can use ice and tylenol for the pain.  She is not diabetic and the description of the pain does not sound like neuropathy.  She does have venous insufficiency but that seems to be controlled with the compression stockings and lasix.    Problem # 2:  MELENA (ICD-578.1) We will try to get the records from her EGD and colonoscopy.  She has had no recurrences of the melena since the scopes and her hgb has been stable.    Problem # 3:  HYPERTENSION (ICD-401.9) He blood pressure is a bit elevated today.  We will recheck at her next appointment and adjust medications if she continue to be elevated. Her updated medication list for this problem includes:    Amlodipine Besylate 5 Mg Tabs (Amlodipine  besylate) .[name]... Take one tablet daily.    Lisinopril 40 Mg Tabs (Lisinopril) .[name]... 1 tablet once a day.    Furosemide 20 Mg Tabs (Furosemide) .[name]... Take 1 tablet by mouth once a day for swelling  BP today: 142/66 Prior BP: 121/66 (05/26/2010)  Prior 10 Yr Risk Heart Disease: 15 % (04/29/2008)  Labs Reviewed: K+: 4.3 (05/26/2010) Creat: : 1.46 (05/26/2010)   Chol: 199 (02/08/2010)   HDL: 48 (02/08/2010)   LDL: 116 (02/08/2010)   TG: 175 (02/08/2010)  Problem # 4:  Preventive Health Care (ICD-V70.0) She has no history of the Pneumovax shot so we will give her that today.  She has gotten her flu shot already.  She is otherwise up to date.  Complete Medication List: 1)  Protonix 40 Mg Pack (Pantoprazole sodium) .... Take one tablet daily. 2)  Oxybutynin Chloride 5 Mg Xr24h-tab (Oxybutynin chloride) .... Take one tablet at bedtime. 3)  Amlodipine Besylate 5 Mg Tabs (Amlodipine besylate) .... Take one tablet daily. 4)  Clotrimazole Anti-fungal 1 % Crea (Clotrimazole) .... Apply to the area 3-4 times per day 5)  Allopurinol 300 Mg Tabs (Allopurinol) .[name].. 1 tablet once a day 6)  Plavix 75 Mg Tabs (Clopidogrel bisulfate) .[name].. 1 tablet daily. 7)  Lisinopril 40 Mg Tabs (Lisinopril) .[name].. 1 tablet once a day. 8)  Biotin 5000 5 Mg Caps (Biotin) .... Take one tablet daily. 9)  Apple Cider Vinegar 300 Mg Tabs (Apple cider vinegar) .... One daily 10)  Calcium 500/d 500-200 Mg-unit Tabs (Calcium carbonate-vitamin d) .... One daily 11)  Vitamin C-acerola 500 Mg Tabs (Ascorbic acid) .... One daily 12)  Lipitor 80 Mg Tabs (Atorvastatin calcium) .... Take one tablet daily. 13)  Polyethylene Glycol 1000 Powd (Polyethylene glycol 1000) .... Use 17g in liquid daily for constipation. 14)  Furosemide 20 Mg Tabs (Furosemide) .... Take 1 tablet by mouth once a day for swelling 15)  Fluocinonide 0.05 % Crea (Fluocinonide) .... Apply to affected areas two times a day. 16)  Premarin 0.625 Mg/gm Crea (Estrogens,  conjugated) .... Apply intravaginally and externally as directed 17)  Cetirizine Hcl 10 Mg Tabs (Cetirizine hcl) .... Take 1 tablet by mouth once a day for itching 18)  Nystop 100000 Unit/gm Powd (Nystatin) .... Use as directed. 19)  Relief Medical Leg Knee High Misc (Elastic bandages & supports) .... Ted compression support hose 20)  Triamcinolone Acetonide 0.1 % Oint (Triamcinolone acetonide) .Marland KitchenMarland KitchenMarland Kitchen  Apply to affected area two times a day 21)  Potassium Chloride Cr 10 Meq Cr-tabs (Potassium chloride) .... Take 1 tablet by mouth once a day  Other Orders: Pneumococcal Vaccine (25427) Admin 1st Vaccine (06237)  Patient Instructions: 1)  Continue all your other medications as directed 2)  I faxed prescriptions to your pharmacy. 3)  You can use ice for your shin pain, 20 minutes on several times per day to help the pain. 4)  Let us know if you have any problems. 5)  Follow up in 3 months. Prescriptions: TRIAMCINOLONE ACETONIDE 0.1 % OINT (TRIAMCINOLONE ACETONIDE) Apply to affected area two times a day  #100g x 0   Entered and Authorized by:   Leodis Sias MD   Signed by:   Leodis Sias MD on 08/26/2010   Method used:   Faxed to ...       [facility] (retail)       [address]       [place], [place]  [address]       Ph: [phone]       Fax: [phone]   RxID:   [id number] NYSTOP 100000 UNIT/GM POWD (NYSTATIN) Use as directed.  #1 bottle x 6   Entered and Authorized by:   Leodis Sias MD   Signed by:   Leodis Sias MD on 08/26/2010   Method used:   Faxed to ...       [facility] (retail)       [address]       [place], [place]  [address]       Ph: [phone]       Fax: [phone]   RxID:   [id number] CETIRIZINE HCL 10 MG TABS (CETIRIZINE HCL) Take 1 tablet by mouth once a day for itching  #30 x 6   Entered and Authorized by:   Leodis Sias MD   Signed by:   Leodis Sias MD on  08/26/2010   Method used:   Faxed to ...       [facility] (retail)       [address]       [place], [place]  [address]       Ph: [phone]       Fax: [phone]   RxID:   [id number] FLUOCINONIDE 0.05 % CREA (FLUOCINONIDE) Apply to affected areas two times a day.  #1 tube x 3   Entered and Authorized by:   Leodis Sias MD   Signed by:   Leodis Sias MD on 08/26/2010   Method used:   Faxed to ...       [facility] (retail)       [address]       [place], [place]  [address]       Ph: [phone]       Fax: [phone]   RxID:   [id number] LIPITOR 80 MG TABS (ATORVASTATIN CALCIUM) Take one tablet daily.  #32 x 11   Entered and Authorized by:   Leodis Sias MD   Signed by:   Leodis Sias MD on 08/26/2010   Method used:   Faxed to ...       [facility] (retail)       [address]       [place], [place]  [address]       Ph: [phone]       Fax:  [phone]   RxID:   [id number] LISINOPRIL 40 MG TABS (LISINOPRIL) 1 tablet once a day.  #32 x 11   Entered and Authorized by:   Leodis Sias MD   Signed by:   Leodis Sias MD on 08/26/2010   Method used:   Faxed to ...       [facility] (retail)       [address]       [place], [place]  [address]       Ph: [phone]       Fax: [phone]   RxID:   [id number] PLAVIX 75 MG TABS (CLOPIDOGREL BISULFATE) 1 tablet daily.  #32 x 11   Entered and Authorized by:   Leodis Sias MD   Signed by:   Leodis Sias MD on 08/26/2010   Method used:   Faxed to ...       [facility] (retail)       [address]       [place], [place]  [address]       Ph: [phone]       Fax: [phone]   RxID:   [id number] AMLODIPINE BESYLATE 5 MG TABS (AMLODIPINE BESYLATE) Take one tablet daily.  #30 x 11   Entered and Authorized by:   Leodis Sias MD   Signed by:    Leodis Sias MD on 08/26/2010   Method used:   Faxed to ...       [facility] (retail)       [address]       [place], [place]  [address]       Ph: [phone]       Fax: [phone]   RxID:   [id number] PROTONIX 40 MG PACK (PANTOPRAZOLE SODIUM) Take one tablet daily.  #32 x 11   Entered and Authorized by:   Leodis Sias MD   Signed by:   Leodis Sias MD on 08/26/2010   Method used:   Faxed to ...       [facility] (retail)       [address]       [place], [place]  [address]       Ph: [phone]       Fax: [phone]   RxID:   [id number]    Orders Added: 1)  Est. Patient Level III [24580] 2)  Pneumococcal Vaccine [90732] 3)  Admin 1st Vaccine [90471]   Immunizations Administered:  Pneumonia Vaccine:    Vaccine Type: Pneumovax    Site: right deltoid    Mfr: Merck    Dose: 0.5 ml    Route: IM    Given by: Stanton Kidney Ditzler RN    Exp. Date: 02/23/2012    Lot #: [id number]    VIS given: 10/04/09 version given August 26, 2010.   Immunizations Administered:  Pneumonia Vaccine:    Vaccine Type: Pneumovax    Site: right deltoid    Mfr: Merck    Dose: 0.5 ml    Route: IM    Given by: Stanton Kidney Ditzler RN    Exp. Date: 02/23/2012    Lot #: [id number]    VIS given: 10/04/09 version given August 26, 2010.   Prevention & Chronic Care Immunizations   Influenza vaccine: Fluvax MCR  (07/20/2010)   Influenza vaccine deferral: Not available  (05/06/2010)   Influenza vaccine due: 08/08/2008    Tetanus booster: 05/07/2009: Td   Td booster  deferral: Not indicated  (05/06/2010)    Pneumococcal vaccine: Pneumovax  (08/26/2010)   Pneumococcal vaccine deferral: Deferred  (12/09/2009)    H. zoster vaccine: Not documented   H. zoster vaccine deferral: Deferred  (12/09/2009)  Colorectal Screening   Hemoccult: Not documented   Hemoccult action/deferral: Ordered  (12/09/2009)    Colonoscopy: Not documented    Colonoscopy action/deferral: Deferred  (12/09/2009)  Other Screening   Pap smear: Not documented   Pap smear action/deferral: Not indicated S/P hysterectomy  (05/14/2009)    Mammogram: ASSESSMENT: Negative - BI-RADS 1^MM DIGITAL SCREENING  (12/01/2009)   Mammogram due: 12/22/2010    DXA bone density scan: Not documented   DXA bone density action/deferral: Deferred  (12/09/2009)   Smoking status: quit  (08/26/2010)  Lipids   Total Cholesterol: 199  (02/08/2010)   Lipid panel action/deferral: Lipid Panel ordered   LDL: 116  (02/08/2010)   LDL Direct: Not documented   HDL: 48  (02/08/2010)   Triglycerides: 175  (02/08/2010)    SGOT (AST): 21  (02/08/2010)   BMP action: Ordered   SGPT (ALT): 21  (02/08/2010)   Alkaline phosphatase: 93  (02/08/2010)   Total bilirubin: 0.5  (02/08/2010)    Lipid flowsheet reviewed?: Yes   Progress toward LDL goal: Unchanged  Hypertension   Last Blood Pressure: 142 / 66  (08/26/2010)   Serum creatinine: 1.46  (05/26/2010)   Serum potassium 4.3  (05/26/2010)    Hypertension flowsheet reviewed?: Yes   Progress toward BP goal: Deteriorated  Self-Management Support :   Personal Goals (by the next clinic visit) :      Personal blood pressure goal: 140/90  (12/09/2009)     Personal LDL goal: 130  (12/09/2009)    Patient will work on the following items until the next clinic visit to reach self-care goals:     Medications and monitoring: take my medicines every day, bring all of my medications to every visit  (08/26/2010)     Eating: eat more vegetables, use fresh or frozen vegetables, eat foods that are low in salt, eat baked foods instead of fried foods, eat fruit for snacks and desserts, limit or avoid alcohol  (08/26/2010)     Activity: take a 30 minute walk every day  (08/26/2010)    Hypertension self-management support: Written self-care plan, Education handout, Resources for patients handout  (08/26/2010)   Hypertension self-care plan  printed.   Hypertension education handout printed    Lipid self-management support: Written self-care plan, Education handout, Resources for patients handout  (08/26/2010)   Lipid self-care plan printed.   Lipid education handout printed      Resource handout printed.  Appended Document: checkup/pcp-Dyquan Minks/hla Case discussed with Dr.Yehuda Printup.  Assessment and plan done together at the time of visit.

## 2010-12-01 NOTE — Progress Notes (Signed)
Summary: stockings, pcp/ hla  Phone Note Call from Patient   Summary of Call: pt calls to check about her stockings, could debbie please call her at [phone]. also she would like for dr Tonny Branch to be her pcp, she DOES NOT want a female pcp, sent to [facility] Initial call taken by: Marin Roberts RN,  May 12, 2010 12:14 PM  Follow-up for Phone Call        That sounds fine to me. [name] can follow up on her stocking request. Follow-up by: Melida Quitter MD,  May 12, 2010 1:55 PM  Additional Follow-up for Phone Call Additional follow up Details #1::        Debbie Ditzler mailed out the stockings on Monday and patient was called and states she received her stockings today.  I also have no problem with being made her PCP. Additional Follow-up by: Leodis Sias MD,  May 12, 2010 2:14 PM     Appended Document: stockings, pcp/ hla Tyler Aas is aware of pt wanting Dr Tonny Branch for PCP. Doris to call pt 05/13/10 - Dr Tonny Branch aware.

## 2010-12-01 NOTE — Progress Notes (Signed)
Summary: refill/gg  Phone Note Refill Request  on June 25, 2008 11:17 AM  Refill request for Lasix 20mg  1 daily  Last refill 04/30/08     **I don't see in EMR  Initial call taken by: Merrie Roof RN,  June 25, 2008 11:20 AM  Follow-up for Phone Call        she is not on lasix as far as i know.  not on list. not on previous notes.  denied.

## 2010-12-01 NOTE — Progress Notes (Signed)
Summary: phone note/gp  Phone Note Call from Patient   Caller: Patient Summary of Call: States she felled yesterday and having a lot of pain in her left leg.  States she's able to move her left foot but the pain is from the foot to the knee. States she has been screaming in pain. Also she has a knot or maybe a bruise.  1100am appt. available but states she cannot be here at the clinic by this time. Pt. instructed to go to the ED; she said she would if pain continues. Initial call taken by: Chinita Pester RN,  April 05, 2010 10:21 AM  Follow-up for Phone Call        Thank you.  That sounds like a good plan.  Follow-up by: Joaquin Courts  MD,  April 05, 2010 10:44 AM

## 2010-12-01 NOTE — Consult Note (Signed)
Summary: [facility]: Dr. Jud[facility]: Dr. Phoebe Perch   Imported By: Florinda Marker 05/11/2008 14:51:26  _____________________________________________________________________  External Attachment:    Type:   Image     Comment:   External Document

## 2010-12-01 NOTE — Medication Information (Signed)
Summary: FUROSEMIDE  FUROSEMIDE   Imported By: Margie Billet 05/09/2010 10:52:21  _____________________________________________________________________  External Attachment:    Type:   Image     Comment:   External Document

## 2010-12-01 NOTE — Progress Notes (Signed)
Summary: refill/ hla  Phone Note Refill Request Message from:  Patient on October 03, 2010 4:12 PM  Refills Requested: Medication #1:  tramadol 50mg  1 tab every 8 hrs   Dosage confirmed as above?Dosage Confirmed please note 10/14/2009 note, tramadol given, never added to med list   Initial call taken by: Marin Roberts RN,  October 03, 2010 4:11 PM  Follow-up for Phone Call        I see that she got it in Dec but I can't even see it on her old med list. Regardless, hasn't been eval for pain for one year. Has appt dec 7th - can discuss then Follow-up by: Blanch Media MD,  October 03, 2010 4:29 PM  Additional Follow-up for Phone Call Additional follow up Details #1::        We will discuss then at her appointment on the 7th. Additional Follow-up by: Leodis Sias MD,  October 03, 2010 10:06 PM    Additional Follow-up for Phone Call Additional follow up Details #2::    She showed or her appointment and I refilled her medications. Follow-up by: Leodis Sias MD,  October 05, 2010 10:48 PM

## 2010-12-01 NOTE — Assessment & Plan Note (Signed)
Summary: FLU/ SB.  Nurse Visit    Prior Medications: AMITRIPTYLINE HCL 50 MG  TABS (AMITRIPTYLINE HCL) Take 1 tablet by mouth at bedtime ALLOPURINOL 300 MG TABS (ALLOPURINOL) Take 1 tablet by mouth once a day KLOR-CON 10 10 MEQ TBCR (POTASSIUM CHLORIDE) Take 1 tablet by mouth two times a day PLAVIX 75 MG TABS (CLOPIDOGREL BISULFATE) Take 1 tablet by mouth once a day LISINOPRIL 40 MG TABS (LISINOPRIL) Take 1 tablet by mouth once a day PROTONIX 40 MG TBEC (PANTOPRAZOLE SODIUM) Take 1 tablet by mouth once a day CITALOPRAM HYDROBROMIDE 40 MG TABS (CITALOPRAM HYDROBROMIDE) Take 1 tablet by mouth once a day VICODIN 5-500 MG TABS (HYDROCODONE-ACETAMINOPHEN) Take 1 tab every 4-6 hrs as needed DETROL LA 4 MG CP24 (TOLTERODINE TARTRATE) Take 1 tablet by mouth once at bedtime LACTULOSE  SOLN (LACTULOSE SOLN) Take 30ml till yo have BM, repeat 4 times in a day. REGLAN 10 MG TABS (METOCLOPRAMIDE HCL) Take 1 tablet by mouth two times a day WELLBUTRIN SR 150 MG TB12 (BUPROPION HCL) Take one pill every day for three days, then start taking one pill twice daily. TYLENOL 325 MG TABS (ACETAMINOPHEN) Take 2 tablet by mouth four times a day for pain CADUET 5-80 MG  TABS (AMLODIPINE-ATORVASTATIN) Take 1 tablet by mouth once a day FUROSEMIDE 20 MG  TABS (FUROSEMIDE) Take 1 tablet by mouth once a day Current Allergies: ! SEPTRA ! ASA   Influenza Vaccine    Vaccine Type: Fluvax Non-MCR    Site: left deltoid    Mfr: novartis    Dose: 0.5 ml    Route: IM    Given by: Krystal Eaton (AAMA)    Exp. Date: 04/28/2008    Lot #: [id number]    VIS given: 04/28/05 version given August 09, 2007.  Flu Vaccine Consent Questions    Do you have a history of severe allergic reactions to this vaccine? no    Any prior history of allergic reactions to egg and/or gelatin? no    Do you have a sensitivity to the preservative Thimersol? no    Do you have a past history of Guillan-Barre Syndrome? no    Do you currently have  an acute febrile illness? no    Have you ever had a severe reaction to latex? no    Vaccine information given and explained to patient? yes    Are you currently pregnant? no   Orders Added: 1)  Influenza Vaccine NON MCR [00028]    ]

## 2010-12-01 NOTE — Progress Notes (Signed)
Summary: refill/ hla  Phone Note Refill Request Message from:  Patient on December 14, 2008 10:18 AM  Refills Requested: Medication #1:  AMITRIPTYLINE HCL 50 MG TABS 1 tablet at bed time.  Medication #2:  WELLBUTRIN SR 150 MG TB12 Take one pill every day for three days Initial call taken by: Marin Roberts RN,  December 14, 2008 10:18 AM

## 2010-12-01 NOTE — Progress Notes (Signed)
Summary: refill/gg  Phone Note Refill Request  on January 19, 2010 4:16 PM  Refills Requested: Medication #1:  DETROL LA 4 MG CP24 Take 1 tablet by mouth once at bedtime Detrol is no longer covered by pt's insurance.  can you change to one of these:  Oxybutynin er, enablex,vesicare, or oxytrol   Method Requested: Fax to Local Pharmacy Initial call taken by: Merrie Roof RN,  January 19, 2010 4:16 PM  Follow-up for Phone Call        Refill approved-nurse to complete    New/Updated Medications: OXYBUTYNIN CHLORIDE 5 MG XR24H-TAB (OXYBUTYNIN CHLORIDE) Take one tablet at bedtime. Prescriptions: OXYBUTYNIN CHLORIDE 5 MG XR24H-TAB (OXYBUTYNIN CHLORIDE) Take one tablet at bedtime.  #32 x 2   Entered and Authorized by:   Elby Showers MD   Signed by:   Elby Showers MD on 01/20/2010   Method used:   Telephoned to ...       [facility] (retail)       [address]       [place], [place]  [address]       Ph: [phone]       Fax: [phone]   RxID:   [id number]   Appended Document: refill/gg rx called in

## 2010-12-01 NOTE — Progress Notes (Signed)
Summary: itching/ hla  Phone Note Call from Patient   Summary of Call: pt calls and states she has a terrible itching on her back, she states it might be tramadol but she wants to continue the tramadol because it works. it is only on her back. she denies changing soap, laundry supplies, lotions, denies newlinnens and clothes that were not laundered first. nothing alleviates except scratching and nothing aggravates. please advise, states she just can't come for an appt. Initial call taken by: Marin Roberts RN,  October 13, 2010 1:45 PM  Follow-up for Phone Call        script sent for topical Follow-up by: Julaine Fusi  DO,  October 13, 2010 4:52 PM    New/Updated Medications: DERMOPLAST 20-0.5 % AERO (BENZOCAINE-MENTHOL) Apply to back as needed for itching and skin irritation Prescriptions: DERMOPLAST 20-0.5 % AERO (BENZOCAINE-MENTHOL) Apply to back as needed for itching and skin irritation  #1 x 11   Entered by:   Julaine Fusi  DO   Authorized by:   Leodis Sias MD   Signed by:   Julaine Fusi  DO on 10/13/2010   Method used:   [facility]        [facility] [address] [phone]* (retail)       [address]       [place], [place]  [address]       Ph: [phone] or [phone]       Fax: [phone]   RxID:   [id number]

## 2010-12-01 NOTE — Medication Information (Signed)
Summary: [facility]   [facility]   Imported By: Margie Billet 05/09/2010 16:10:04  _____________________________________________________________________  External Attachment:    Type:   Image     Comment:   External Document

## 2010-12-01 NOTE — Miscellaneous (Signed)
Summary: [facility]:M[facility]ility [facility]  [facility]   Imported By: Florinda Marker 10/11/2009 15:10:18  _____________________________________________________________________  External Attachment:    Type:   Image     Comment:   External Document

## 2010-12-01 NOTE — Assessment & Plan Note (Signed)
Summary: thirst/swelling/gg   see last note   Vital Signs:  Patient profile:   70 year old female Height:      65 inches (165.10 cm) Weight:      234.2 pounds (106.45 kg) BMI:     39.11 Temp:     97.4 degrees F (36.33 degrees C) oral Pulse rate:   70 / minute BP sitting:   120 / 71  (right arm)  Vitals Entered By: Stanton Kidney Ditzler RN (May 06, 2010 3:19 PM) Is Patient Diabetic? No Pain Assessment Patient in pain? no      Nutritional Status BMI of > 30 = obese Nutritional Status Detail appetite down  Have you ever been in a relationship where you felt threatened, hurt or afraid?denies   Does patient need assistance? Functional Status Self care Ambulation Impaired:Risk for fall Comments Useas a walker. Problems with constipation. Urine output down past 3 days. Legs swollen. Discuss Lasis and Claritin - look the same. Knee hi Ted stockings mailed to pt Calf 14" and length 16".   Primary Care Provider:  Elby Showers MD   History of Present Illness: Patient is a 70 year old female who presents today for a follow up of her medications as well as bilateral leg swelling.  She was recently started on Lasix 20 mg once daily and has noticed that the swelling is better but it is still bothering her as well as decreased endurance for acitivies and some trouble breathing at night.  She denies any blue finger tips or lips, SOB, nausea, or chest pain.  She does have compression stockings but has stopped using them because they were making her feet and above the stockings so compressed that she was worried they were cutting off circulation to her toes.  She did have some pain and tingling in the toes but no blue or black toes.  She is also having problems with a few of her medicines specifically the Lasix and Claritin.  She states that the pills are so similar that she can't tell if she is taking the correct medications.  She has tried to keep them straight but cannot be sure.    Depression  History:      The patient denies a depressed mood most of the day and a diminished interest in her usual daily activities.        The patient denies that she feels like life is not worth living, denies that she wishes that she were dead, and denies that she has thought about ending her life.         Preventive Screening-Counseling & Management  Alcohol-Tobacco     Alcohol drinks/day: 0     Smoking Status: quit     Year Quit: 1999     Pack years: 50     Passive Smoke Exposure: yes  Caffeine-Diet-Exercise     Does Patient Exercise: no  Current Medications (verified): 1)  Protonix 40 Mg Pack (Pantoprazole Sodium) .... Take One Tablet Daily. 2)  Oxybutynin Chloride 5 Mg Xr24h-Tab (Oxybutynin Chloride) .... Take One Tablet At Bedtime. 3)  Amlodipine Besylate 5 Mg Tabs (Amlodipine Besylate) .... Take One Tablet Daily. 4)  Clotrimazole Anti-Fungal 1 %  Crea (Clotrimazole) .... Apply To The Area 3-4 Times Per Day 5)  Allopurinol 300 Mg Tabs (Allopurinol) .[name].. 1 Tablet Once A Day 6)  Plavix 75 Mg Tabs (Clopidogrel Bisulfate) .[name].. 1 Tablet Daily. 7)  Lisinopril 40 Mg Tabs (Lisinopril) .[name].. 1 Tablet Once A Day.  8)  Biotin 5000 5 Mg Caps (Biotin) .... Take One Tablet Daily. 9)  Apple Cider Vinegar 300 Mg Tabs (Apple Cider Vinegar) .... One Daily 10)  Calcium 500/d 500-200 Mg-Unit Tabs (Calcium Carbonate-Vitamin D) .... One Daily 11)  Vitamin C-Acerola 500 Mg Tabs (Ascorbic Acid) .... One Daily 12)  Lipitor 80 Mg Tabs (Atorvastatin Calcium) .... Take One Tablet Daily. 13)  Polyethylene Glycol 1000  Powd (Polyethylene Glycol 1000) .... Use 17g in Liquid Daily For Constipation. 14)  Furosemide 20 Mg Tabs (Furosemide) .... Take 1 Tablet By Mouth Daily. 15)  Fluocinonide 0.05 % Crea (Fluocinonide) .... Apply To Affected Areas Two Times A Day. 16)  Premarin 0.625 Mg/gm Crea (Estrogens, Conjugated) .... Apply Intravaginally and Externally As Directed 17)  Loratadine 10 Mg Tabs (Loratadine) .... Take  One Tablet Daily For Sinuses. 18)  Hydrocortisone Acetate 25 Mg Supp (Hydrocortisone Acetate) .... Use Once A Day. 19)  Nystop 100000 Unit/gm Powd (Nystatin) .... Use As Directed.  Allergies: 1)  ! Septra 2)  ! Asa  Past History:  Past medical, surgical, family and social histories (including risk factors) reviewed for relevance to current acute and chronic problems.  Past Medical History: Reviewed history from 02/14/2008 and no changes required. Cervical cancer, hx of (ovarian cancer 1972 oophorectomy) GERD Gout Hypertension Low back pain,  Sec to DDD and DJD Osteoarthritis - both hips Transient ischemic attack, hx of Sensorineural hearing loss ( bilateral w/chronic L ear pain) CPAP Mitral valve prolapse Chronic venous insufficiency Diverticular Disease Hyperlipidemia Boils Gout  Past Surgical History: Reviewed history from 09/03/2006 and no changes required. Total hip replacement -Both Hip 2005 and 2006 Hysterectomy 1972 Oophorectomy 1972 DJD- L5-S1 s/p sx C5-C6 Diskectomy 07/07  Family History: Reviewed history and no changes required.  Social History: Reviewed history from 09/29/2008 and no changes required. 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 quit smoking 1999  Review of Systems      See HPI  Physical Exam  Additional Exam:  General: Well developed, female in no acute distress  Head: Atraumatic, normocephalic with no signs of trauma  Eyes: PERRLA, EOM intact  Ears: TM intact  Nose: Nares patent, mucosa is pink and moist, no polyps noted  Mouth: Mucosa is pink and moist, dention,   Neck: Supple, full ROM, no thyromegaly or masses noted  Resp: Clear to ascultation bilaterally, no wheezes, rales, or rhonchi noted  CV: Regular rate and rhythm with no murmurs, rubs, or gallops noted  Abdomen: Soft, non-tender, non-distended with normal bowel sounds  Musculoskelatal: ROM full with intact strength, no pain to palpation  Neurologic: Alert and oriented x3, Cranial  nerves II-XII grossly intact, DTR normal and symmetric  Pulses: Radial, brachial, carotid, femoral, dorsal pedis, and posterior tibial pulses equal and symmetric  Extremities: 3+ edema in the ankles and feet.  above the ankle is 1+ edema to the knees then more swollen above that consistant with knee high TED stockings.    Impression & Recommendations:  Problem # 1:  UNSPECIFIED DIASTOLIC HEART FAILURE (ICD-428.30) I reviewed her 2D echo from 03/16/10 which shows grade 2 diastolic dysfunction.  This seems to be the cause of her bilateral leg swelling as well as the dyspnea.  Since she just started the Lasix we will increase it to two times a day dosing and see her back to recheck in 1 month how she is doing as well as a bmet to follow her electrolytes.  With the confusion over her pills and identification, we called her pharmacy  and they will contact a different manufacturer for a different formulation which should help alleviate the confusion.  Her updated medication list for this problem includes:    Plavix 75 Mg Tabs (Clopidogrel bisulfate) .[name]... 1 tablet daily.    Lisinopril 40 Mg Tabs (Lisinopril) .[name]... 1 tablet once a day.    Furosemide 20 Mg Tabs (Furosemide) .[name]... Take 1 tablet by mouth two times a day for swelling  Problem # 2:  HYPERLIPIDEMIA (ICD-272.4) She is at her goal for LDL at the top dose of lipitor so we will follow her every 6 months with a FLP and liver enzyme test so she will be due in October for this check.  Her updated medication list for this problem includes:    Lipitor 80 Mg Tabs (Atorvastatin calcium) .[name]... Take one tablet daily.  Labs Reviewed: SGOT: 21 (02/08/2010)   SGPT: 21 (02/08/2010)  Prior 10 Yr Risk Heart Disease: 15 % (04/29/2008)   HDL:48 (02/08/2010), 43 (12/15/2008)  LDL:116 (02/08/2010), 92 (29/52/8413)  Chol:199 (02/08/2010), 177 (12/15/2008)  Trig:175 (02/08/2010), 208 (12/15/2008)  Problem # 3:  HYPERTENSION (ICD-401.9) Blood pressure is at goal  today.  We will recheck it at her follow up as well as a bmet to follow her electrolytes with the change in her diaretic. Her updated medication list for this problem includes:    Amlodipine Besylate 5 Mg Tabs (Amlodipine besylate) .[name]... Take one tablet daily.    Lisinopril 40 Mg Tabs (Lisinopril) .[name]... 1 tablet once a day.    Furosemide 20 Mg Tabs (Furosemide) .[name]... Take 1 tablet by mouth two times a day for swelling  BP today: 120/71 Prior BP: 135/71 (03/21/2010)  Prior 10 Yr Risk Heart Disease: 15 % (04/29/2008)  Labs Reviewed: K+: 4.6 (03/21/2010) Creat: : 1.16 (03/21/2010)   Chol: 199 (02/08/2010)   HDL: 48 (02/08/2010)   LDL: 116 (02/08/2010)   TG: 175 (02/08/2010)  Complete Medication List: 1)  Protonix 40 Mg Pack (Pantoprazole sodium) .... Take one tablet daily. 2)  Oxybutynin Chloride 5 Mg Xr24h-tab (Oxybutynin chloride) .... Take one tablet at bedtime. 3)  Amlodipine Besylate 5 Mg Tabs (Amlodipine besylate) .... Take one tablet daily. 4)  Clotrimazole Anti-fungal 1 % Crea (Clotrimazole) .... Apply to the area 3-4 times per day 5)  Allopurinol 300 Mg Tabs (Allopurinol) .[name].. 1 tablet once a day 6)  Plavix 75 Mg Tabs (Clopidogrel bisulfate) .[name].. 1 tablet daily. 7)  Lisinopril 40 Mg Tabs (Lisinopril) .[name].. 1 tablet once a day. 8)  Biotin 5000 5 Mg Caps (Biotin) .... Take one tablet daily. 9)  Apple Cider Vinegar 300 Mg Tabs (Apple cider vinegar) .... One daily 10)  Calcium 500/d 500-200 Mg-unit Tabs (Calcium carbonate-vitamin d) .... One daily 11)  Vitamin C-acerola 500 Mg Tabs (Ascorbic acid) .... One daily 12)  Lipitor 80 Mg Tabs (Atorvastatin calcium) .... Take one tablet daily. 13)  Polyethylene Glycol 1000 Powd (Polyethylene glycol 1000) .... Use 17g in liquid daily for constipation. 14)  Furosemide 20 Mg Tabs (Furosemide) .... Take 1 tablet by mouth two times a day for swelling 15)  Fluocinonide 0.05 % Crea (Fluocinonide) .... Apply to affected areas two times a day. 16)   Premarin 0.625 Mg/gm Crea (Estrogens, conjugated) .... Apply intravaginally and externally as directed 17)  Loratadine 10 Mg Tabs (Loratadine) .... Take one tablet daily for sinuses. 18)  Hydrocortisone Acetate 25 Mg Supp (Hydrocortisone acetate) .... Use once a day. 19)  Nystop 100000 Unit/gm Powd (Nystatin) .... Use as directed. 20)  Relief Medical Leg Knee High Misc (Elastic bandages & supports) .... Ted compression support hose  Patient Instructions: 1)  Increase your Furosemide to 20 mg TWICE per day. 2)  Please schedule a follow-up appointment in 1 month. 3)  Any problems with dizziness or passing out please call the clinic. Prescriptions: FUROSEMIDE 20 MG TABS (FUROSEMIDE) Take 1 tablet by mouth two times a day for swelling  #60 x 3   Entered and Authorized by:   Leodis Sias MD   Signed by:   Leodis Sias MD on 05/06/2010   Method used:   Print then Give to Patient   RxID:   [id number] RELIEF MEDICAL LEG KNEE HIGH  MISC (ELASTIC BANDAGES & SUPPORTS) Ted Compression Support hose  #1 x 0   Entered and Authorized by:   Leodis Sias MD   Signed by:   Leodis Sias MD on 05/06/2010   Method used:   Print then Give to Patient   RxID:   [id number]    Prevention & Chronic Care Immunizations   Influenza vaccine: Fluvax MCR  (07/26/2009)   Influenza vaccine deferral: Not available  (05/06/2010)   Influenza vaccine due: 08/08/2008    Tetanus booster: 05/07/2009: Td   Td booster deferral: Not indicated  (05/06/2010)    Pneumococcal vaccine: Not documented   Pneumococcal vaccine deferral: Deferred  (12/09/2009)    H. zoster vaccine: Not documented   H. zoster vaccine deferral: Deferred  (12/09/2009)  Colorectal Screening   Hemoccult: Not documented   Hemoccult action/deferral: Ordered  (12/09/2009)    Colonoscopy: Not documented   Colonoscopy action/deferral: Deferred  (12/09/2009)  Other Screening   Pap smear: Not documented   Pap  smear action/deferral: Not indicated S/P hysterectomy  (05/14/2009)    Mammogram: ASSESSMENT: Negative - BI-RADS 1^MM DIGITAL SCREENING  (12/01/2009)   Mammogram due: 12/22/2010    DXA bone density scan: Not documented   DXA bone density action/deferral: Deferred  (12/09/2009)   Smoking status: quit  (05/06/2010)  Lipids   Total Cholesterol: 199  (02/08/2010)   Lipid panel action/deferral: Lipid Panel ordered   LDL: 116  (02/08/2010)   LDL Direct: Not documented   HDL: 48  (02/08/2010)   Triglycerides: 175  (02/08/2010)    SGOT (AST): 21  (02/08/2010)   BMP action: Ordered   SGPT (ALT): 21  (02/08/2010)   Alkaline phosphatase: 93  (02/08/2010)   Total bilirubin: 0.5  (02/08/2010)    Lipid flowsheet reviewed?: Yes   Progress toward LDL goal: At goal  Hypertension   Last Blood Pressure: 120 / 71  (05/06/2010)   Serum creatinine: 1.16  (03/21/2010)   Serum potassium 4.6  (03/21/2010)    Hypertension flowsheet reviewed?: Yes   Progress toward BP goal: At goal  Self-Management Support :   Personal Goals (by the next clinic visit) :      Personal blood pressure goal: 140/90  (12/09/2009)     Personal LDL goal: 130  (12/09/2009)    Patient will work on the following items until the next clinic visit to reach self-care goals:     Medications and monitoring: take my medicines every day, check my blood pressure, bring all of my medications to every visit  (05/06/2010)     Eating: eat more vegetables, use fresh or frozen vegetables, eat baked foods instead of fried foods, eat fruit for snacks and desserts, limit or avoid alcohol  (05/06/2010)     Activity: take a 30 minute walk every day  (05/06/2010)  Hypertension self-management support: Written self-care plan, Education handout, Resources for patients handout  (05/06/2010)   Hypertension self-care plan printed.   Hypertension education handout printed    Lipid self-management support: Written self-care plan, Education  handout, Resources for patients handout  (05/06/2010)   Lipid self-care plan printed.   Lipid education handout printed      Resource handout printed.  Appended Document: thirst/swelling/gg   see last note Please bill patient as an established patient level III

## 2010-12-01 NOTE — Progress Notes (Signed)
Summary: refill/gg  Phone Note Refill Request  on August 02, 2007 11:20 AM  Refills Requested: Medication #1:  ALLOPURINOL 300 MG TABS Take 1 tablet by mouth once a day   Last Refilled: 06/22/2007  Method Requested: electronic Initial call taken by: Merrie Roof RN,  August 02, 2007 11:20 AM  Follow-up for Phone Call        refilled electronically      Prescriptions: ALLOPURINOL 300 MG TABS (ALLOPURINOL) Take 1 tablet by mouth once a day  #30 x 2   Entered and Authorized by:   Clydie Braun MD   Signed by:   Clydie Braun MD on 08/02/2007   Method used:   [facility] to ...       [phone] [address]       [address]       [place], [place]  [address]       Ph: [phone] or [phone]       Fax: [phone]   RxID:   [id number] ALLOPURINOL 300 MG TABS (ALLOPURINOL) Take 1 tablet by mouth once a day  #30 x 2   Entered by:   Clydie Braun MD   Authorized by:   [name] [facility] ATTENDING DESKTOP   Signed by:   Clydie Braun MD on 08/02/2007   Method used:   Print then Give to Patient   RxID:   [id number]

## 2010-12-01 NOTE — Assessment & Plan Note (Signed)
Summary: CHECKUP/ SB.   Vital Signs:  Patient Profile:   70 Years Old Female Weight:      237.5 pounds (107.95 kg) Temp:     98.8 degrees F (37.11 degrees C) oral Pulse rate:   85 / minute BP sitting:   132 / 76  (right arm)  Pt. in pain?   yes    Location:   right hip    Intensity:   10  Vitals Entered By: Stanton Kidney Ditzler RN (July 03, 2007 3:38 PM)              Is Patient Diabetic? No Nutritional Status Detail good  Have you ever been in a relationship where you felt threatened, hurt or afraid?denies   Does patient need assistance? Functional Status Self care Ambulation Impaired:Risk for fall Comments Uses a cane.     PCP:  [name] MD  Chief Complaint:  To meet Dr Liliane Channel - Both hip replacement have been recalled.[name]  History of Present Illness: [name] is a 70 y/o AA woman with multiple medical problems who presents to the clinic today to establish care with me. She has multiple complaints. She reports that the bilateral hip replacements that she had by Dr. Ophelia Charter in '03 and '05 and apparently it is being "recalled". She is very upset about  it and has been worrying herself sick about the "possibility" that the hip replacements will be recalled. She is awaiting to hear from Dr. Ophelia Charter currently.     Pt is going to her opthalmologist , Dr. Mitzi Davenport tomorrow to have her cataracts evaluated. Pt sees him every 2 years for her prescription lenses check.  Also pt reports recent weight gain but on check of past office visits, she has ranged between 237-242 lbs.   Current Allergies (reviewed today): ! SEPTRA ! ASA    Risk Factors: Tobacco use:  quit    Year quit:  2000  Mammogram History:    Date of Last Mammogram:  12/18/2006   Review of Systems  The patient denies fever, chest pain, syncope, and dyspnea on exhertion.     Physical Exam  General:     alert, well-developed, and well-nourished.   Head:     atraumatic.   Eyes:     Lt pupil  irregular 2/2 to trauma. Rt pupil round and reactive to light.Rt eye cataract. Ears:     no external deformities, no cerumen impaction. Nose:     no external erythema.   Mouth:     pharynx pink and moist.   Neck:     supple.   Lungs:     normal respiratory effort, normal breath sounds, no crackles, and no wheezes.   Heart:     normal rate and regular rhythm. ?2/6 SEM  Abdomen:     soft, non-tender, normal bowel sounds, and no distention.   Extremities:     trace right pedal edema.   Psych:     Pt tangential. Difficult to redirect.    Impression & Recommendations:  Problem # 1:  HYPERTENSION (ICD-401.9) Pt has greatly appreciated switching to combination pills then multiple single pills. Her BP is well controlled on current meds and her LE edema has improved. I recommended that pt keep LE elevated at bedtime since she uses a recliner to sleep 2/2 to hip problems. Her updated medication list for this problem includes:    Lisinopril 40 Mg Tabs (Lisinopril) .[name]... Take 1 tablet by mouth once a day  Caduet 5-80 Mg Tabs (Amlodipine-atorvastatin) .[name]... Take 1 tablet by mouth once a day    Furosemide 20 Mg Tabs (Furosemide) .[name]... Take 1 tablet by mouth once a day  Orders: T-Basic Metabolic Panel [phone])   Problem # 2:  DEPRESSION (ICD-311) I believe pt may have an element of a mood disorder with current excessive talking, anxiety issues etc.?Bipolardisorder. She would likely benefit from seeing a psychiatrist. I did not change any medications and we could not talk about being seen by behavioural health today due to constraints of time. Will readdress at next visit. Her updated medication list for this problem includes:    Amitriptyline Hcl 50 Mg Tabs (Amitriptyline hcl) .[name]... Take 1 tablet by mouth at bedtime    Citalopram Hydrobromide 40 Mg Tabs (Citalopram hydrobromide) .[name]... Take 1 tablet by mouth once a day    Wellbutrin Sr 150 Mg Tb12 (Bupropion hcl) .[name]... Take one pill  every day for three days, then start taking one pill twice daily.   Problem # 3:  OSTEOARTHRITIS (ICD-715.90) Pt reports control of pain with Vicodin. No medication changes made today. She was informed that Vicodin contains tylenol as well and not to use more than 2gm of tylenol daily to avoid hepatotoxicity. Her updated medication list for this problem includes:    Vicodin 5-500 Mg Tabs (Hydrocodone-acetaminophen) .[name]... Take 1 tab every 4-6 hrs as needed    Tylenol 325 Mg Tabs (Acetaminophen) .[name]... Take 2 tablet by mouth four times a day for pain   Complete Medication List: 1)  Amitriptyline Hcl 50 Mg Tabs (Amitriptyline hcl) .... Take 1 tablet by mouth at bedtime 2)  Allopurinol 300 Mg Tabs (Allopurinol) .... Take 1 tablet by mouth once a day 3)  Klor-con 10 10 Meq Tbcr (Potassium chloride) .... Take 1 tablet by mouth two times a day 4)  Plavix 75 Mg Tabs (Clopidogrel bisulfate) .... Take 1 tablet by mouth once a day 5)  Lisinopril 40 Mg Tabs (Lisinopril) .... Take 1 tablet by mouth once a day 6)  Protonix 40 Mg Tbec (Pantoprazole sodium) .... Take 1 tablet by mouth once a day 7)  Citalopram Hydrobromide 40 Mg Tabs (Citalopram hydrobromide) .... Take 1 tablet by mouth once a day 8)  Vicodin 5-500 Mg Tabs (Hydrocodone-acetaminophen) .... Take 1 tab every 4-6 hrs as needed 9)  Detrol La 4 Mg Cp24 (Tolterodine tartrate) .... Take 1 tablet by mouth once at bedtime 10)  Lactulose Soln (Lactulose soln) .... Take 30ml till yo have bm, repeat 4 times in a day. 11)  Reglan 10 Mg Tabs (Metoclopramide hcl) .... Take 1 tablet by mouth two times a day 12)  Wellbutrin Sr 150 Mg Tb12 (Bupropion hcl) .... Take one pill every day for three days, then start taking one pill twice daily. 13)  Tylenol 325 Mg Tabs (Acetaminophen) .... Take 2 tablet by mouth four times a day for pain 14)  Caduet 5-80 Mg Tabs (Amlodipine-atorvastatin) .... Take 1 tablet by mouth once a day 15)  Furosemide 20 Mg Tabs (Furosemide)  .... Take 1 tablet by mouth once a day   Patient Instructions: 1)  Please schedule a follow-up appointment in 3 months. 2)  Check your Blood Pressure regularly. If it is above 150/90 consistently, you should make an appointment. 3)  Make sure you keep your legs elevated at betime. Keep at least 2 pillows beneath your legs while on the recliner at night or whenever you are sitting in a chair.\par 4)  You will  be called with any abnormal labs.

## 2010-12-01 NOTE — Assessment & Plan Note (Signed)
Summary: needs HRT, yeast vag,abd,groin, R hip pain/pcp-enriquez/hla   Vital Signs:  Patient Profile:   70 Years Old Female Height:     65 inches (165.10 cm) Weight:      229.8 pounds (104.45 kg) BMI:     38.38 Temp:     98.1 degrees F (36.72 degrees C) oral Pulse rate:   70 / minute BP sitting:   181 / 89  (right arm)  Pt. in pain?   yes    Location:   neck and low abd    Intensity:   10  Vitals Entered By: Stanton Kidney Ditzler RN (April 29, 2008 10:58 AM)              Is Patient Diabetic? No Nutritional Status BMI of > 30 = obese Nutritional Status Detail down  Have you ever been in a relationship where you felt threatened, hurt or afraid?denies   Does patient need assistance? Functional Status Self care Ambulation Impaired:Risk for fall Comments Uses a walker.      PCP:  [name] MD  Chief Complaint:  Right hip hurts.Premarin cream does not help - vag. Needs PO med. Rash in both groin area. Cramps in legs. Left foot swollen. Off BP and Plavix med for 1 week. Bleeding from hyst inc. Neck has cramps and right hip hurts..  History of Present Illness: This is 70 yo female with PMH of chronic hip pain, recurrent MRSA skin infections who presents with following complaints. First complaint is painful and burning vulvar and groin area. She was seen here at our clinic one month ago and was diagnosed with yeast infection. At that time she was given nystatin powder and steroid cream which did not help. Patient says that the rash is getting worse and she is scratching it and then it starts bleeding. Rash gets worse with sweating. She is also complaining of painful urination because of the rash. She has no other discharges and no blood in urine. No fever, chills, no urgency or frequency.  Her second problem is bump under her right arm that has been present for about 3 months now. It is painful and is getting bigger. There is no discharge. She has been having recurrent boils on her  right breast.   Her third problem is right hip pain that has been going on for about 2-3 years and is getting worse. She has had bilateral hip replacemnt in 2003 and is following up with Dr. Ophelia Charter at [facility]. She saw him last year and was given medication for arthritis and gout. She reports no significant pain relief and rates her pain 7/10 in severity. She as been having difficulty walking even with her walker.   Cardiovascular Risk History:      Positive major cardiovascular risk factors include female age 64 years old or older, hyperlipidemia, and hypertension.  Negative major cardiovascular risk factors include non-tobacco-user status.        Positive history for target organ damage include prior stroke (or TIA).      Updated Prior Medication List: AMITRIPTYLINE HCL 50 MG  TABS (AMITRIPTYLINE HCL) Take 1 tablet by mouth at bedtime ALLOPURINOL 300 MG TABS (ALLOPURINOL) Take 1 tablet by mouth once a day KLOR-CON 10 10 MEQ TBCR (POTASSIUM CHLORIDE) Take 1 tablet by mouth two times a day PLAVIX 75 MG TABS (CLOPIDOGREL BISULFATE) Take 1 tablet by mouth once a day LISINOPRIL 40 MG TABS (LISINOPRIL) Take 1 tablet by mouth once a day PROTONIX 40 MG  TBEC (PANTOPRAZOLE SODIUM) Take 1 tablet by mouth once a day DETROL LA 4 MG CP24 (TOLTERODINE TARTRATE) Take 1 tablet by mouth once at bedtime LACTULOSE  SOLN (LACTULOSE SOLN) Take 30ml till yo have BM, repeat 4 times in a day. WELLBUTRIN SR 150 MG TB12 (BUPROPION HCL) Take one pill every day for three days, then start taking one pill twice daily. TYLENOL 325 MG TABS (ACETAMINOPHEN) Take 2 tablet by mouth four times a day for pain CADUET 5-80 MG  TABS (AMLODIPINE-ATORVASTATIN) Take 1 tablet by mouth once a day HYDROCHLOROTHIAZIDE 25 MG  TABS (HYDROCHLOROTHIAZIDE) Take 1 tablet by mouth once a day (has not been taking for 3 weeks) PREMARIN 0.625 MG/GM  CREA (ESTROGENS, CONJUGATED) 1g application intravaginally and externally daily  FLUOCINONIDE 0.05 %  OINT (FLUOCINONIDE) Use as directed PIROXICAM 20 MG  CAPS (PIROXICAM) Take 1 tablet by mouth once a day NYAMYC 100000 UNIT/GM  POWD (NYSTATIN) Apply to dry skin twice a day  Current Allergies (reviewed today): ! SEPTRA ! ASA    Risk Factors: Tobacco use:  quit    Year quit:  1999 Alcohol use:  no Exercise:  no Seatbelt use:  100 %  Mammogram History:    Date of Last Mammogram:  12/18/2006   Review of Systems       The patient complains of difficulty walking.  The patient denies fever, chest pain, dyspnea on exertion, hematuria, incontinence, genital sores, suspicious skin lesions, and unusual weight change.         derm see HPI   Physical Exam  General:     alert and normal appearance.   Head:     normocephalic and atraumatic.   Eyes:     pupils equal, pupils round, and pupils reactive to light.   Neck:     supple.   Breasts:     1 cm bump on the right breast, and multiple small scars from recurrent boils. Lungs:     normal respiratory effort, normal breath sounds, no crackles, and no wheezes.   Heart:     normal rate, regular rhythm, no murmur, no gallop, and no rub.   Abdomen:     soft, non-tender, normal bowel sounds, and no distention.   Msk:     normal ROM, no joint tenderness, no joint swelling, and no joint warmth.   Pulses:     R radial normal and L radial normal.   Neurologic:     alert & oriented X3, cranial nerves II-XII intact, strength normal in all extremities, and sensation intact to light touch.   Skin:     candida intertrigo under pannus and on ventral incisional scar Axillary Nodes:     no R axillary adenopathy and no L axillary adenopathy.   Psych:     Oriented X3.       Impression & Recommendations:  Problem # 1:  INTERTRIGO, CANDIDAL (ICD-695.89) Assessment: Unchanged Considering patient's worsening infection we decided to keep her on nystatin powder, premarin cream was discontinued and we placed her on  clotrimazole cream and Diflucan 100mg  tab. Patient was advised to return to clinic in 2 weeks for follow up.  Problem # 2:  HIP PAIN, RIGHT, CHRONIC (ICD-719.45) Bilateral hip x-ray was ordered and showed no acute bony abnormalities. We placed her on Darvonfor pain and scheduled her for diagnostic X Ray Fluoroscopy.   The following medications were removed from the medication list:    Vicodin 5-500 Mg Tabs (Hydrocodone-acetaminophen) .[name]... Take 1 tab every  4-6 hrs as needed    Piroxicam 20 Mg Caps (Piroxicam) .[name]... Take 1 tablet by mouth once a day    Piroxicam 20 Mg Caps (Piroxicam) .[name]... Take 1 tablet by mouth once a day  Her updated medication list for this problem includes:    Tylenol 325 Mg Tabs (Acetaminophen) .[name]... Take 2 tablet by mouth four times a day for pain    Darvon-n 100 Mg Tabs (Propoxyphene napsylate) .[name]... Take 1 tablet by mouth every four hours as needed for pain  Orders: Diagnostic X-Ray/Fluoroscopy (Diagnostic X-Ray/Flu)   Problem # 3:  BOILS, RECURRENT (ICD-680.9) Patient had a new boil on her right breast and we tried to drain it to get some cultures but were not able to get anythig out. Patient was advised to place warm compress on the boil and return to clinic if it worsens or she develops systemic symptoms.  She will likely require antibiotics at her next vist, but this was held today secondary to her severe candidal infection.    Problem # 4:  HYPERLIPIDEMIA (ICD-272.4) We will check non fasting lipid panel today and will follow up on next appointment.  Her updated medication list for this problem includes:    Caduet 5-80 Mg Tabs (Amlodipine-atorvastatin) .[name]... Take 1 tablet by mouth once a day  Orders: T-Lipid Profile ([id number])   Problem # 5:  HYPERTENSION (ICD-401.9) Patien's BP = 181/89 today, however she has not been taking HCTZ for almost one week. We will continue current medications and will recheck  blood pressure in two weeks. Her updated  medication list for this problem includes:    Lisinopril 40 Mg Tabs (Lisinopril) .[name]... Take 1 tablet by mouth once a day    Caduet 5-80 Mg Tabs (Amlodipine-atorvastatin) .[name]... Take 1 tablet by mouth once a day    Hydrochlorothiazide 25 Mg Tabs (Hydrochlorothiazide) .[name]... Take 1 tablet by mouth once a day   Problem # 6:  Screening Cervical Cancer (ICD-V76.2) We will do PAP Smear on her next visit since her last PAP smear was 2 years ago and she has a history of cervical cancer.  This was not done today secondary to severe, painful candidal skin infection.    Problem # 7:  Preventive Health Care (ICD-V70.0) We will check thyroid function tests on her next visit given her hystory of hypothyroidism  Complete Medication List: 1)  Amitriptyline Hcl 50 Mg Tabs (Amitriptyline hcl) .... Take 1 tablet by mouth at bedtime 2)  Allopurinol 300 Mg Tabs (Allopurinol) .... Take 1 tablet by mouth once a day 3)  Klor-con 10 10 Meq Tbcr (Potassium chloride) .... Take 1 tablet by mouth two times a day 4)  Plavix 75 Mg Tabs (Clopidogrel bisulfate) .... Take 1 tablet by mouth once a day 5)  Lisinopril 40 Mg Tabs (Lisinopril) .... Take 1 tablet by mouth once a day 6)  Protonix 40 Mg Tbec (Pantoprazole sodium) .... Take 1 tablet by mouth once a day 7)  Detrol La 4 Mg Cp24 (Tolterodine tartrate) .... Take 1 tablet by mouth once at bedtime 8)  Lactulose Soln (Lactulose soln) .... Take 30ml till yo have bm, repeat 4 times in a day. 9)  Wellbutrin Sr 150 Mg Tb12 (Bupropion hcl) .... Take one pill every day for three days, then start taking one pill twice daily. 10)  Tylenol 325 Mg Tabs (Acetaminophen) .... Take 2 tablet by mouth four times a day for pain 11)  Caduet 5-80 Mg Tabs (Amlodipine-atorvastatin) .... Take 1 tablet  by mouth once a day 12)  Hydrochlorothiazide 25 Mg Tabs (Hydrochlorothiazide) .... Take 1 tablet by mouth once a day 13)  Nyamyc 100000 Unit/gm Powd (Nystatin) .... Apply to dry skin twice a day 14)   Clotrimazole Anti-fungal 1 % Crea (Clotrimazole) .... Apply to the area 3-4 times per day 15)  Darvon-n 100 Mg Tabs (Propoxyphene napsylate) .... Take 1 tablet by mouth every four hours as needed for pain 16)  Diflucan 100 Mg Tabs (Fluconazole) .... Take 1 tablet by mouth once a day  Other Orders: T-Comprehensive Metabolic Panel [id number] [phone]) T-CBC w/Diff ([id number]) Physician Supervision  [id number])  Cardiovascular Risk Assessment/Plan:      The patient's hypertensive risk group is category C: Target organ damage and/or diabetes.  Her calculated 10 year risk of coronary heart disease is 15 %.  Today's blood pressure is 181/89.     Patient Instructions: 1)  For boil, place warm compress on tthe boil and repeat couple of times per day. If the boil gets worse please return to clinic. 2)  Please schedule a follow-up appointment in 2 weeks.   Prescriptions: DARVON-N 100 MG  TABS (PROPOXYPHENE NAPSYLATE) Take 1 tablet by mouth every four hours as needed for pain  #180 x 2   Entered by:   Manning Charity MD   Authorized by:   Mliss Sax MD   Signed by:   Manning Charity MD on 04/29/2008   Method used:   Print then Give to Patient   RxID:   [id number] DIFLUCAN 100 MG  TABS (FLUCONAZOLE) Take 1 tablet by mouth once a day  #15 x 0   Entered and Authorized by:   Mliss Sax MD   Signed by:   Mliss Sax MD on 04/29/2008   Method used:   Print then Give to Patient   RxID:   [id number] CLOTRIMAZOLE ANTI-FUNGAL 1 %  CREA (CLOTRIMAZOLE) Apply to the area 3-4 times per day  #1 tube x 11   Entered and Authorized by:   Mliss Sax MD   Signed by:   Mliss Sax MD on 04/29/2008   Method used:   Print then Give to Patient   RxID:   [id number] DARVOCET-N 100 100-650 MG  TABS (PROPOXYPHENE N-APAP) Take 1 tablet by mouth every 4 hours as needed for pain  #180 x 2   Entered and Authorized by:   Mliss Sax MD   Signed by:   Mliss Sax MD on 04/29/2008   Method used:   Print  then Give to Patient   RxID:   [id number]  ]

## 2010-12-01 NOTE — Consult Note (Signed)
Summary: [facility]:Dr. Wilhemina [facility]:Dr. Phoebe Perch   Imported By: Florinda Marker 04/08/2008 15:17:53  _____________________________________________________________________  External Attachment:    Type:   Image     Comment:   External Document

## 2010-12-01 NOTE — Assessment & Plan Note (Signed)
Summary: tetanus/gg  Nurse Visit     Allergies: 1)  ! Septra 2)  ! Jonne Ply    Immunization History:  Tetanus/Td Immunization History:    Tetanus/Td:  td (05/07/2009)   Medication Administration  Injection # 1:    Medication: tetanus and dephtheria    Diagnosis: PREVENTIVE HEALTH CARE (ICD-V70.0)    Route: IM    Site: R deltoid    Exp Date: 01/01/2011    Lot #: [id number]    Mfr: Sanofi Pasteur    Comments: tetanus and diphtheria 0.73ml    Patient tolerated injection without complications    Given by: Marin Roberts RN (May 07, 2009 4:23 PM)  Orders Added: 1)  Admin of Therapeutic Inj  intramuscular or subcutaneous [[id number]]

## 2010-12-06 ENCOUNTER — Ambulatory Visit (HOSPITAL_COMMUNITY)
Admission: RE | Admit: 2010-12-06 | Discharge: 2010-12-06 | Disposition: A | Discharge status: Home / Self Care | Payer: PRIVATE HEALTH INSURANCE | Source: Ambulatory Visit | Attending: Internal Medicine | Admitting: Internal Medicine

## 2010-12-06 ENCOUNTER — Ambulatory Visit (HOSPITAL_COMMUNITY): Admission: RE | Admit: 2010-12-06 | Payer: Self-pay | Source: Home / Self Care | Admitting: Internal Medicine

## 2010-12-06 DIAGNOSIS — Z0000 Encounter for general adult medical examination without abnormal findings: Secondary | ICD-10-CM

## 2010-12-22 ENCOUNTER — Other Ambulatory Visit: Payer: Self-pay | Admitting: Internal Medicine

## 2010-12-22 DIAGNOSIS — B3789 Other sites of candidiasis: Secondary | ICD-10-CM

## 2010-12-27 ENCOUNTER — Other Ambulatory Visit: Payer: Self-pay | Admitting: *Deleted

## 2010-12-28 MED ORDER — TRIAMCINOLONE ACETONIDE 0.1 % EX CREA: TOPICAL_CREAM | Freq: Two times a day (BID) | CUTANEOUS | Status: AC

## 2011-01-12 LAB — VITAMIN B12: Vitamin B-12: 463 pg/mL (ref 211–911)

## 2011-01-12 LAB — IRON AND TIBC
Iron: 34 ug/dL — ABNORMAL LOW (ref 42–135)
Saturation Ratios: 12 % — ABNORMAL LOW (ref 20–55)
TIBC: 274 ug/dL (ref 250–470)
UIBC: 240 ug/dL

## 2011-01-12 LAB — FOLATE: Folate: >20 ng/mL

## 2011-01-12 LAB — CBC
HCT: 30.1 % — ABNORMAL LOW (ref 36.0–46.0)
Hemoglobin: 9.8 g/dL — ABNORMAL LOW (ref 12.0–15.0)
MCH: 30.8 pg (ref 26.0–34.0)
MCHC: 32.6 g/dL (ref 30.0–36.0)
MCV: 94.7 fL (ref 78.0–100.0)
Platelets: 195 10*3/uL (ref 150–400)
RBC: 3.18 MIL/uL — ABNORMAL LOW (ref 3.87–5.11)
RDW: 15.8 % — ABNORMAL HIGH (ref 11.5–15.5)
WBC: 5.9 10*3/uL (ref 4.0–10.5)

## 2011-01-12 LAB — BASIC METABOLIC PANEL
BUN: 18 mg/dL (ref 6–23)
CO2: 25 mEq/L (ref 19–32)
Calcium: 8.6 mg/dL (ref 8.4–10.5)
Chloride: 113 mEq/L — ABNORMAL HIGH (ref 96–112)
Creatinine, Ser: 1.47 mg/dL — ABNORMAL HIGH (ref 0.4–1.2)
GFR calc Af Amer: 43 mL/min — ABNORMAL LOW (ref >60)
GFR calc non Af Amer: 35 mL/min — ABNORMAL LOW (ref >60)
Glucose, Bld: 102 mg/dL — ABNORMAL HIGH (ref 70–99)
Potassium: 3.9 mEq/L (ref 3.5–5.1)
Sodium: 144 mEq/L (ref 135–145)

## 2011-01-12 LAB — LIPID PANEL
Cholesterol: 154 mg/dL (ref 0–200)
HDL: 36 mg/dL — ABNORMAL LOW (ref >39)
LDL Cholesterol: 87 mg/dL (ref 0–99)
Total CHOL/HDL Ratio: 4.3 RATIO
Triglycerides: 155 mg/dL — ABNORMAL HIGH (ref <150)
VLDL: 31 mg/dL (ref 0–40)

## 2011-01-12 LAB — RETICULOCYTES
RBC.: 3.14 MIL/uL — ABNORMAL LOW (ref 3.87–5.11)
Retic Count, Absolute: 40.8 10*3/uL (ref 19.0–186.0)
Retic Ct Pct: 1.3 % (ref 0.4–3.1)

## 2011-01-12 LAB — FERRITIN: Ferritin: 20 ng/mL (ref 10–291)

## 2011-01-13 LAB — CBC
HCT: 34 % — ABNORMAL LOW (ref 36.0–46.0)
Hemoglobin: 11.2 g/dL — ABNORMAL LOW (ref 12.0–15.0)
MCH: 30.9 pg (ref 26.0–34.0)
MCHC: 32.9 g/dL (ref 30.0–36.0)
MCV: 93.9 fL (ref 78.0–100.0)
Platelets: 217 10*3/uL (ref 150–400)
RBC: 3.62 MIL/uL — ABNORMAL LOW (ref 3.87–5.11)
RDW: 15.6 % — ABNORMAL HIGH (ref 11.5–15.5)
WBC: 7.3 10*3/uL (ref 4.0–10.5)

## 2011-01-13 LAB — DIFFERENTIAL
Basophils Absolute: 0 10*3/uL (ref 0.0–0.1)
Basophils Relative: 0 % (ref 0–1)
Eosinophils Absolute: 0.2 10*3/uL (ref 0.0–0.7)
Eosinophils Relative: 3 % (ref 0–5)
Lymphocytes Relative: 26 % (ref 12–46)
Lymphs Abs: 1.9 10*3/uL (ref 0.7–4.0)
Monocytes Absolute: 0.3 10*3/uL (ref 0.1–1.0)
Monocytes Relative: 4 % (ref 3–12)
Neutro Abs: 4.9 10*3/uL (ref 1.7–7.7)
Neutrophils Relative %: 67 % (ref 43–77)

## 2011-01-13 LAB — BRAIN NATRIURETIC PEPTIDE: Pro B Natriuretic peptide (BNP): <30 pg/mL (ref 0.0–100.0)

## 2011-01-13 LAB — POCT CARDIAC MARKERS
CKMB, poc: <1 ng/mL — ABNORMAL LOW (ref 1.0–8.0)
Myoglobin, poc: 119 ng/mL (ref 12–200)
Troponin i, poc: <0.05 ng/mL (ref 0.00–0.09)

## 2011-01-13 LAB — COMPREHENSIVE METABOLIC PANEL
ALT: 22 U/L (ref 0–35)
AST: 26 U/L (ref 0–37)
Albumin: 3.8 g/dL (ref 3.5–5.2)
Alkaline Phosphatase: 92 U/L (ref 39–117)
BUN: 19 mg/dL (ref 6–23)
CO2: 23 mEq/L (ref 19–32)
Calcium: 9.2 mg/dL (ref 8.4–10.5)
Chloride: 111 mEq/L (ref 96–112)
Creatinine, Ser: 1.33 mg/dL — ABNORMAL HIGH (ref 0.4–1.2)
GFR calc Af Amer: 48 mL/min — ABNORMAL LOW (ref >60)
GFR calc non Af Amer: 40 mL/min — ABNORMAL LOW (ref >60)
Glucose, Bld: 109 mg/dL — ABNORMAL HIGH (ref 70–99)
Potassium: 4.3 mEq/L (ref 3.5–5.1)
Sodium: 141 mEq/L (ref 135–145)
Total Bilirubin: 0.4 mg/dL (ref 0.3–1.2)
Total Protein: 7.1 g/dL (ref 6.0–8.3)

## 2011-01-13 LAB — SAMPLE TO BLOOD BANK

## 2011-01-13 LAB — PROTIME-INR
INR: 0.99 (ref 0.00–1.49)
Prothrombin Time: 13.3 seconds (ref 11.6–15.2)

## 2011-01-13 LAB — APTT: aPTT: 26 seconds (ref 24–37)

## 2011-01-13 LAB — MRSA PCR SCREENING: MRSA by PCR: NEGATIVE

## 2011-01-13 LAB — HEMOCCULT GUIAC POC 1CARD (OFFICE): Fecal Occult Bld: POSITIVE

## 2011-01-27 ENCOUNTER — Ambulatory Visit (INDEPENDENT_AMBULATORY_CARE_PROVIDER_SITE_OTHER): Payer: PRIVATE HEALTH INSURANCE | Admitting: Internal Medicine

## 2011-01-27 ENCOUNTER — Encounter: Payer: Self-pay | Admitting: Internal Medicine

## 2011-01-27 DIAGNOSIS — M25559 Pain in unspecified hip: Secondary | ICD-10-CM

## 2011-01-27 DIAGNOSIS — M545 Low back pain, unspecified: Secondary | ICD-10-CM

## 2011-01-27 DIAGNOSIS — I10 Essential (primary) hypertension: Secondary | ICD-10-CM

## 2011-01-27 DIAGNOSIS — E785 Hyperlipidemia, unspecified: Secondary | ICD-10-CM

## 2011-01-27 DIAGNOSIS — B3789 Other sites of candidiasis: Secondary | ICD-10-CM

## 2011-01-27 DIAGNOSIS — M109 Gout, unspecified: Secondary | ICD-10-CM

## 2011-01-27 LAB — COMPREHENSIVE METABOLIC PANEL
ALT: 16 U/L (ref 0–35)
AST: 23 U/L (ref 0–37)
Albumin: 4.3 g/dL (ref 3.5–5.2)
Alkaline Phosphatase: 87 U/L (ref 39–117)
BUN: 19 mg/dL (ref 6–23)
CO2: 23 mEq/L (ref 19–32)
Calcium: 9.6 mg/dL (ref 8.4–10.5)
Chloride: 105 mEq/L (ref 96–112)
Creat: 1.15 mg/dL (ref 0.40–1.20)
Glucose, Bld: 102 mg/dL — ABNORMAL HIGH (ref 70–99)
Potassium: 4 mEq/L (ref 3.5–5.3)
Sodium: 142 mEq/L (ref 135–145)
Total Bilirubin: 0.6 mg/dL (ref 0.3–1.2)
Total Protein: 7.1 g/dL (ref 6.0–8.3)

## 2011-01-27 LAB — CBC WITH DIFFERENTIAL/PLATELET
Basophils Absolute: 0 10*3/uL (ref 0.0–0.1)
Basophils Relative: 0 % (ref 0–1)
Eosinophils Absolute: 0.3 10*3/uL (ref 0.0–0.7)
Eosinophils Relative: 5 % (ref 0–5)
HCT: 35.3 % — ABNORMAL LOW (ref 36.0–46.0)
Hemoglobin: 11.4 g/dL — ABNORMAL LOW (ref 12.0–15.0)
Lymphocytes Relative: 34 % (ref 12–46)
Lymphs Abs: 2.3 10*3/uL (ref 0.7–4.0)
MCH: 30.1 pg (ref 26.0–34.0)
MCHC: 32.3 g/dL (ref 30.0–36.0)
MCV: 93.1 fL (ref 78.0–100.0)
Monocytes Absolute: 0.3 10*3/uL (ref 0.1–1.0)
Monocytes Relative: 4 % (ref 3–12)
Neutro Abs: 3.7 10*3/uL (ref 1.7–7.7)
Neutrophils Relative %: 56 % (ref 43–77)
Platelets: 255 10*3/uL (ref 150–400)
RBC: 3.79 MIL/uL — ABNORMAL LOW (ref 3.87–5.11)
RDW: 16 % — ABNORMAL HIGH (ref 11.5–15.5)
WBC: 6.6 10*3/uL (ref 4.0–10.5)

## 2011-01-27 LAB — SEDIMENTATION RATE: Sed Rate: 22 mm/hr (ref 0–22)

## 2011-01-27 NOTE — Progress Notes (Signed)
  Subjective:    Patient ID: JAMEL DUNTON, female    DOB: Mar 11, 1941, 70 y.o.   MRN: [id number]  HPI  Ms. Cinelli is a 70 year old female who presents today for routine follow up of her HTN, HLD, and her chronic low back and hip pain.  She has been able to get and take her medications and reports no side effects.   She has been having increased pain in her hips, knees, shoulders, and ankles.  She has seen Dr Ophelia Charter who restarted her on piroxicam and has a follow up appointment with him on Monday April 2nd.  She states that she has not been able to get around as much as she would like and is even unable to lay flat without significant pain in her back and hips.  She has noticed some swelling in her hands in the morning but no warm or red joints lately.  She denies any fevers, chills, locking of joints, or weakness.  She states that she hasn't been taking her Tramadol either because she wasn't sure when she was supposed to take it.    Review of Systems    Constitutional: Denies fever, chills, diaphoresis, appetite change and fatigue.  HEENT: Denies photophobia, eye pain, redness, hearing loss, ear pain, congestion, sore throat, rhinorrhea, sneezing, mouth sores, trouble swallowing, neck pain, neck stiffness and tinnitus.   Respiratory: Denies SOB, DOE, cough, chest tightness,  and wheezing.   Cardiovascular: Denies chest pain, palpitations and leg swelling.  Gastrointestinal: Denies nausea, vomiting, abdominal pain, diarrhea, constipation, blood in stool and abdominal distention.  Genitourinary: Denies dysuria, urgency, frequency, hematuria, flank pain and difficulty urinating.  Musculoskeletal: Positive for Back pain, knee pain, shoulder pain, and morning swelling of the hands. Denies myalgias and gait problem.  Skin: Denies pallor, rash and wound.  Neurological: Denies dizziness, seizures, syncope, weakness, light-headedness, numbness and headaches.  Hematological: Denies adenopathy. Easy  bruising, personal or family bleeding history  Psychiatric/Behavioral: Denies suicidal ideation, mood changes, confusion, nervousness, sleep disturbance and agitation  Objective:   Physical Exam    Constitutional: Vital signs reviewed.  Patient is a well-developed and well-nourished woman in no acute distress and cooperative with exam. Alert and oriented x3.  Head: Normocephalic and atraumatic Ear: TM normal bilaterally Mouth: no erythema or exudates, MMM Eyes: PERRL, EOMI, conjunctivae normal, No scleral icterus.  Neck: Supple, Trachea midline normal ROM, No JVD, mass, thyromegaly, or carotid bruit present.  Cardiovascular: RRR, S1 normal, S2 normal, no MRG, pulses symmetric and intact bilaterally Pulmonary/Chest: CTAB, no wheezes, rales, or rhonchi Abdominal: Soft. Non-tender, non-distended, bowel sounds are normal, no masses, organomegaly, or guarding present.  GU: no CVA tenderness Musculoskeletal: No erythema, or stiffness,There is tenderness to palpation over the greater trochanters bilaterally in the hips.  There is pain to palpation over the sacrum and paraspinous muscles in the lumbar spine.  ROM of the spine, hips, and knees is limited by pain.  There is 2+ ankle swelling noted bilaterally.  No calf swelling or tenderness to palpation over the bilateral lower legs.  Hematology: no cervical, inginal, or axillary adenopathy.  Neurological: A&O x3, Strenght is normal and symmetric bilaterally, cranial nerve II-XII are grossly intact, no focal motor deficit, sensory intact to light touch bilaterally.  Skin: Warm, dry and intact. No rash, cyanosis, or clubbing.  Psychiatric: Normal mood and affect. speech and behavior is normal. Judgment and thought content normal. Cognition and memory are normal.   Assessment & Plan:

## 2011-01-27 NOTE — Patient Instructions (Addendum)
Continue the Piroxicam 20 mg for the pain in your hips and knees.  You can use heating pad or ice to help with the pain.    I will call if there is anything with the lab tests that we need to do anything about.  Follow up with me in 1 month to see how your doing.

## 2011-01-28 LAB — C-REACTIVE PROTEIN: CRP: 0.6 mg/dL — ABNORMAL HIGH (ref <0.6)

## 2011-01-28 LAB — RHEUMATOID FACTOR: Rhuematoid fact SerPl-aCnc: <10 IU/mL (ref <=14)

## 2011-01-29 ENCOUNTER — Encounter: Payer: Self-pay | Admitting: Internal Medicine

## 2011-01-29 MED ORDER — CETIRIZINE HCL 10 MG PO TABS: 10.0000 mg | ORAL_TABLET | Freq: Every day | ORAL | Status: DC

## 2011-01-29 MED ORDER — FLUOCINONIDE 0.05 % EX CREA: 1.0000 "application " | TOPICAL_CREAM | Freq: Two times a day (BID) | CUTANEOUS | Status: DC

## 2011-01-29 MED ORDER — ESTROGENS, CONJUGATED 0.625 MG/GM VA CREA: 0.5000 g | TOPICAL_CREAM | Freq: Every day | VAGINAL | Status: DC

## 2011-01-29 MED ORDER — NYSTATIN 100000 UNIT/GM EX POWD: 1.0000 g | Freq: Two times a day (BID) | CUTANEOUS | Status: DC

## 2011-01-29 MED ORDER — PANTOPRAZOLE SODIUM 40 MG PO TBEC: 40.0000 mg | DELAYED_RELEASE_TABLET | Freq: Every day | ORAL | Status: DC

## 2011-01-29 MED ORDER — OXYBUTYNIN CHLORIDE ER 5 MG PO TB24: 5.0000 mg | ORAL_TABLET | Freq: Every day | ORAL | Status: DC

## 2011-01-29 MED ORDER — CLOTRIMAZOLE 1 % VA CREA: 1.0000 | TOPICAL_CREAM | Freq: Two times a day (BID) | VAGINAL | Status: DC

## 2011-01-29 MED ORDER — FUROSEMIDE 20 MG PO TABS: 20.0000 mg | ORAL_TABLET | Freq: Every day | ORAL | Status: DC

## 2011-01-29 MED ORDER — ALLOPURINOL 300 MG PO TABS: 300.0000 mg | ORAL_TABLET | Freq: Every day | ORAL | Status: DC

## 2011-01-29 NOTE — Assessment & Plan Note (Signed)
Blood pressure today is better then her previous.  With her lasix use she is due for electrolyte follow up so we will check a Cmet to get that and the liver enzymes for the lipitor follow up as well. She is not diabetic so her goal is 140/90 which she is close to.

## 2011-01-29 NOTE — Assessment & Plan Note (Signed)
[name] has bilateral pain to palpation over the trochanter bursa.  With the polyarticular arthritis she has we will check for RA today otherwise we will continue to treat with a NSAID and consider possible corticosteroid injection in the future if this does not improve.

## 2011-01-29 NOTE — Assessment & Plan Note (Signed)
The patient has a long history of low back pain and has had a diskectomy in the past.  She has some stiffness of the back and has very limited range of motion.  She would likely benefit from PT and water aerobics but is resistant to this.  She was given a scholarship to the [facility] at her last visit but hasn't used it.  I encouraged her to use the Tramadol and the Piroxicam as directed and to continue to move as much as she can to limit progression of the DDD and DJD in her back.  With no radicular pain there is no indication for imaging at this time but may need it in the future if it does not improve.

## 2011-01-29 NOTE — Assessment & Plan Note (Signed)
She is due for a FLP and monitoring of her liver enzymes today.  We will order those and make any adjustments needed after we get the results.  She has been well controlled in the past on her Lipitor.

## 2011-01-29 NOTE — Assessment & Plan Note (Signed)
Uric acid level on Allopurinol is 4.2 which is in the normal range.  She likely has gouty polyarticular arthritis that will continue to bother her for sometime though she also has some warning signs for possible RA.  We will check an RF, ESR, CRP, and anti-CCP today and have her follow up in a month after her appointment with Dr. Ophelia Charter to see how she is doing.  If she continue to be in pain she will likely require some PT and maybe a course of steroids.

## 2011-01-30 LAB — CYCLIC CITRUL PEPTIDE ANTIBODY, IGG: Cyclic Citrullin Peptide Ab: <2 U/mL (ref 0.0–5.0)

## 2011-02-24 ENCOUNTER — Encounter: Payer: Self-pay | Admitting: Internal Medicine

## 2011-02-24 ENCOUNTER — Ambulatory Visit (INDEPENDENT_AMBULATORY_CARE_PROVIDER_SITE_OTHER): Payer: PRIVATE HEALTH INSURANCE | Admitting: Internal Medicine

## 2011-02-24 DIAGNOSIS — L259 Unspecified contact dermatitis, unspecified cause: Secondary | ICD-10-CM

## 2011-02-24 DIAGNOSIS — F329 Major depressive disorder, single episode, unspecified: Secondary | ICD-10-CM

## 2011-02-24 MED ORDER — AMITRIPTYLINE HCL 25 MG PO TABS: 25.0000 mg | ORAL_TABLET | Freq: Every day | ORAL | Status: DC

## 2011-02-24 MED ORDER — FLUOCINONIDE 0.05 % EX CREA: 1.0000 "application " | TOPICAL_CREAM | Freq: Two times a day (BID) | CUTANEOUS | Status: DC

## 2011-02-24 MED ORDER — CITALOPRAM HYDROBROMIDE 20 MG PO TABS: 20.0000 mg | ORAL_TABLET | Freq: Every day | ORAL | Status: DC

## 2011-02-24 NOTE — Progress Notes (Signed)
Subjective:    Patient ID: [name], female    DOB: May 05, 1941, 70 y.o.   MRN: [id number]  HPI  Patient is a 70 year old woman with a PMH listed who presents to the clinic today complaining of worsening pain in her legs and arms.  She has a history of degenerative disease, gout, and osteoarthritis but the pain has been getting worse lately.  She states the pain is in her bilateral biceps area and in her bilateral calf and thigh area.  The pain is not in the joints but in the muscles.  She also has some pains in her back that are bothering her.  She states that she has days where she doesn't get out of bed or get ready because of the pain.  She also has periods where she will go without showering or bathing for days because she just can't move.  She denies any fevers, chills, weakness, numbness, tingling, swelling in the muscles, or joints, erythema over the areas, or trauma.    [name] also states that she has not been sleeping well for many months.  She has to take her oxybutin or her tramadol and even then she only gets 3-4 hours of sleep a night.  She denies any racing thoughts, anxiety, hearing voices, or visual disturbances.  She states that she has been crying almost daily and feels isolated without being able to go to church because of the muscle aches.  She states her concentration is poor and she feels depressed.  She has been eating more lately as well.  She denies SI and HI.   Review of Systems    Constitutional: Positive for fatigue. Denies fever, chills, diaphoresis, appetite change.  HEENT: Denies photophobia, eye pain, redness, hearing loss, ear pain, congestion, sore throat, rhinorrhea, sneezing, mouth sores, trouble swallowing, neck pain, neck stiffness and tinnitus.   Respiratory: Denies SOB, DOE, cough, chest tightness,  and wheezing.   Cardiovascular: Denies chest pain, palpitations and leg swelling.  Gastrointestinal: Denies nausea, vomiting, abdominal pain,  diarrhea, constipation, blood in stool and abdominal distention.  Genitourinary: Denies dysuria, urgency, frequency, hematuria, flank pain and difficulty urinating.  Musculoskeletal: Positive for myalgias. Denies back pain, joint swelling, arthralgias and gait problem.  Skin: Denies pallor, rash and wound.  Neurological: Denies dizziness, seizures, syncope, weakness, light-headedness, numbness and headaches.  Hematological: Denies adenopathy. Easy bruising, personal or family bleeding history  Psychiatric/Behavioral: Positive for sleep disturbance, mood changes.  Denies suicidal ideation, confusion, nervousness, and agitation   Objective:   Physical Exam    Constitutional: Vital signs reviewed.  Patient is a well-developed and well-nourished woman in no acute distress and cooperative with exam. Alert and oriented x3. She is tearful at points during the interview. Head: Normocephalic and atraumatic Ear: TM normal bilaterally Mouth: no erythema or exudates, MMM Eyes: PERRL, EOMI, conjunctivae normal, No scleral icterus.  Neck: Supple, Trachea midline normal ROM, No JVD, mass, thyromegaly, or carotid bruit present.  Cardiovascular: RRR, S1 normal, S2 normal, no MRG, pulses symmetric and intact bilaterally Pulmonary/Chest: CTAB, no wheezes, rales, or rhonchi Abdominal: Soft. Non-tender, non-distended, bowel sounds are normal, no masses, organomegaly, or guarding present.  GU: no CVA tenderness Musculoskeletal: No joint deformities, erythema, or stiffness, ROM full and no nontender joints.  Pain to palpation of the biceps, hamstrings, quads, calf, and forearms.  There is also point tenderness paraspinously.  Hematology: no cervical, inginal, or axillary adenopathy.  Neurological: A&O x3, Strength is normal and symmetric bilaterally, cranial  nerve II-XII are grossly intact, no focal motor deficit, sensory intact to light touch bilaterally.  Skin: Warm, dry and intact. No rash, cyanosis, or  clubbing.  Psychiatric: Mildly depressed mood and flat affect. speech is tangential but behavior is normal. Judgment and thought content normal. Cognition and memory are normal.   Assessment & Plan:

## 2011-02-24 NOTE — Patient Instructions (Addendum)
Start Amitriptyline 25 mg tablets take 1/2 tablet at bedtime to help you sleep.    Start Celexa 20 mg tablets take 1/2 tablet at bedtime for 7 days then 1 tablet daily after that to help with your mood.

## 2011-02-26 NOTE — Assessment & Plan Note (Signed)
I think Ms. Pettet's symptoms are consistent with depression. She has a history of depression in the past and has been off medication since 2009.  She is socially isolated and even with heavy involvement with her church she doesn't get out much.  The mobility does help but her gout is well controlled and there are no other warning signs for more serious disease that could be causing the muscle pain.  With her insomnia as well as the depressive symptoms of depressed mood, impaired concentration, increased eating, and socially isolating behavior.  She has been on Celexa and amitriptyline in the past for this and responded well so I will restart them today.  We will taper up the celexa from 10 mg this week to 20 mg next week and start at 1/2 of a 25 mg tablet of amitriptyline and see her back in 2 weeks to see how she is doing.  She is encouraged to call the clinic if she notices any side effects of the amitriptyline isn't helping her sleep.

## 2011-02-26 NOTE — Assessment & Plan Note (Addendum)
She is running out of her Lidex cream before the month is up.  When I filled it last time I gave her a smaller tube then she has been getting before so I will fill for the larger tube to try to make it last for the full month.  She is using it daily for her dry, patchy, itchy skin that is consistent with eczema.

## 2011-03-14 NOTE — Consult Note (Signed)
NAME:  [name], [name]:  [id number]   MEDICAL RECORD NO.:  [id number]          PATIENT TYPE:  INP   LOCATION:  1845                         FACILITY:  [facility]   PHYSICIAN:  Charlcie Cradle. Delford Field, MD, FCCPDATE OF BIRTH:  1941-08-24   DATE OF CONSULTATION:  09/05/2007  DATE OF DISCHARGE:                                 CONSULTATION   CHIEF COMPLAINT:  Evaluate for shock state.   HISTORY OF PRESENT ILLNESS:  A 70 year old [place] American female who  became weak and fell.  Brought to the emergency room.  Initially had  blood pressure of 95/59, pulse rate 102.  After a liter of fluids, her  pressure is up to 112/60 and heart rate down less than 100.  She is  saturating 98%.  We were asked for central line placement.  Does not  appear to be necessary, as her critical care consultation.  She has  boils under both arms with increased pain, and on this basis has had  previous history of axillary abscesses that were MRSA-positive.  We were  asked to see for ICU consultation.   PAST MEDICAL HISTORY:  1. Recurrent cellulitis, methicillin-resistant Staphylococcus aureus.      Last date of this was April of 2008.  2. History of hypothyroidism.  3. Stress urinary incontinence.  4. Obstipation.  5. Depression.  6. TIAs.  7. Osteoarthritis.  8. Chronic back pain.  9. Gout.  10.Cervical cancer.  11.Reflux disease.  12.Venous insufficiency.  13.Mitral prolapse.  14.Bursitis.  15.Hyperlipidemia.  16.Hypertension.  17.Chronic bronchitis.   MEDICATIONS PRIOR TO ADMISSION:  1. Amitriptyline 50 mg daily.  2. Allopurinol 300 mg daily.  3. Potassium 10 mEq daily.  4. Plavix 75 mg daily.  5. Lisinopril 40 mg daily.  6. Protonix 40 mg daily.  7. B12 daily.  8. Lactulose daily.  9. Reglan 10 mg b.i.d.  10.Wellbutrin 3 times daily.  11.Caduet 5/80.  12.Furosemide 20 mg daily.   PHYSICAL EXAMINATION:  VITAL SIGNS:  Currently, blood pressure 112/60,  pulse 100, saturation  98%, temperature 99.9 rectal.  CHEST:  Clear without evidence of wheeze, rale, or rhonchi.  CARDIAC:  Regular rate and rhythm without S3.  Normal S1 and S2.  ABDOMEN:  Soft, protuberant.  Bowel sounds were active.  EXTREMITIES:  Perfused.  NEUROLOGIC:  Intact.  SKIN:  There was an area of folliculitis in the right axillary and left  axillary areas, the likely source of sepsis.   LABORATORY DATA:  All labs are still pending at the time of this  dictation and are being drawn at the time of this dictation.  Preexisting labs on this patient shows in July of 2008 an alk phos of  94, creatinine 1.5, BUN 20,  chloride 107.  Hemoglobin was 10.  Sodium  was 142.   IMPRESSION:  1. Sepsis but no severe sepsis at this stage.  2. Hypovolemia.  3. Mild hypotension, fluid-responsive in the setting of ongoing      continued use of antihypertensive medications.   RECOMMENDATIONS:  Hold all antihypertensive medications, give IV fluid  hydration, administer antibiotics,  obtain blood cultures.  Will follow  with you.  No indication for central line or septic shock protocol.      Charlcie Cradle Delford Field, MD, [facility]  Electronically Signed     PEW/MEDQ  D:  09/05/2007  T:  09/06/2007  Job:  [id number]   cc:   Ileana Roup, M.D.

## 2011-03-14 NOTE — Consult Note (Signed)
NAMEDEYSI, SOLDO NO.:  [id number]   MEDICAL RECORD NO.:  [id number]          PATIENT TYPE:  INP   LOCATION:  4743                         FACILITY:  [facility]   PHYSICIAN:  Anselm Pancoast. Weatherly, M.D.DATE OF BIRTH:  09-Feb-1941   DATE OF CONSULTATION:  DATE OF DISCHARGE:                                 CONSULTATION   CHIEF COMPLAINT:  Tenderness right axilla, probable abscess.   HISTORY:  [name] is a 70 year old black female, obese, who  has a long history of medical problems and appears she was seen in the  clinic with tenderness in the right axilla, was found to be mildly  hypertensive, and has basically spent the last 24 hours in the emergency  room where she has been evaluated with numerous treatments and  evaluation.  It appears that her cardiac enzymes were okay; she has had  a chest x-ray in followup of a lesion that Dr. Edwyna Shell has been watching  that shows no acute problems and that she was mildly hypertensive, but  then the chart kind of says that this is probably not a true septic  pattern even though when she first presented her white count was nearly  20,000.  Her hematocrit was mildly decreased at 32.7 and she has a past  history and I am not sure how many month ago where she had a little MRSA  infection of an abscess over the sternum and, I think, she was treated  with Septra and also drainage of the abscess; I am not sure how long she  has been off the antibiotics, and then they evaluated her with an  ultrasound of the right axilla since she is tender in the right axilla,  but there is nothing that is real obvious, superficial abscess, but  there is tenderness kind of deep in the axilla right at the lateral edge  of pectoralis major that I have been informed is an abscess.  The  question I have is could this be a lymphadenitis from the kind of  smoldering abscess that she has had previously over the sternum and, I  think, that it is  definitely too deep to try to do any type of I&D with  local anesthesia.  There is no report on the chart at this time about  the ultrasound and I will try to locate it.  I think that she should be  placed n.p.o. and will plan on draining this in the operating room later  today.  They have already started her on vancomycin and Zosyn.  She has  been seen by the critical care, I think, when they first questioned the  evaluation for hypertension and there was some question about having a  PICC line placed, but the patient states that she does not want any  central lines placed.  Her EKG on the chart shows a sinus rhythm with  sinus arrhythmias and her cardiac enzymes are not that of a cardiac  ischemia.  The patient understands the planned procedure and agrees to a  drainage in the operating room, and I would also  lance or drain that  little area on the sternum that is right below where she has had a  previous bone abscess in the last few months.  There was some question  of whether she has had some areas in the left axilla, but I am not able  to definitely feel any obvious abscess in the left axilla at this time.  This patient's glucose is normal and her BUN and creatinine are normal,  creatinine is slightly high at 1.36, and we will see if an operative  time can be arranged this afternoon for drainage of the abscesses in the  right axilla and over her sternum.           ______________________________  Anselm Pancoast. Zachery Dakins, M.D.     WJW/MEDQ  D:  09/06/2007  T:  09/06/2007  Job:  [id number]

## 2011-03-14 NOTE — Discharge Summary (Signed)
NAMESHARAE, ZAPPULLA NO.:  [id number]   MEDICAL RECORD NO.:  [id number]          PATIENT TYPE:  INP   LOCATION:  4743                         FACILITY:  [facility]   PHYSICIAN:  Joaquin Courts, MD     DATE OF BIRTH:  1941-09-30   DATE OF ADMISSION:  09/05/2007  DATE OF DISCHARGE:  09/11/2007                               DISCHARGE SUMMARY   DISCHARGE DIAGNOSES:  1. Recurrent MRSA abscesses.  2. Early sepsis.  3. Falls.  4. Hypokalemia.  5. Hypertension.  6. Hyperlipidemia.  7. Depression.  8. Gout.  9. Normocytic anemia.  10.History of hypothyroidism.  11.History of TIA.  12.GERD.  13.Hx of bilateral hip replacements.  14.Hiatal hernia.   DISCHARGE MEDICATIONS:  1. Doxycycline 100 mg b.i.d. x10 days.  2. Vicodin 5-500 mg one tab every four to six hours p.r.n. for two      weeks.  3. Allopurinol 300 mg q.daily.  4. KCL 10 mEq b.i.d.  5. Plavix 75 mg q.daily.  6. Protonix 40 mg q.daily.  7. Citalopram 40 mg q.daily.  8. Detrol LA 4 mg q.h.s.  9. Lactulose as needed.  10.Amlodipine/atorvastatin 5-80 mg q.daily.  11.Lasix 20 mg q.daily.   DISPOSITION AND FOLLOWUP:  The patient is to follow up with Dr.  Zachery Dakins in surgery and is to call number [phone] for an appointment  in two weeks.  The patient is also to follow up with Dr. Lorel Monaco in the  internal medicine outpatient clinic here at [facility] on  September 21, 2007.  At that time, her axilla needs to be checked along  with a CBC to evaluate hemoglobin and a B-Met to evaluate potassium.   PROCEDURES PERFORMED:  No procedures were performed during this  hospitalization.   CONSULTATIONS:  Critical care was consulted and signed off after one  day.   ADMITTING HISTORY AND PHYSICAL:  The patient is a 70 year old female  presenting to the emergency room after she arrived at the clinic the day  prior to admission and was noted to have a blood pressures with  systolics in the 70s and 80s.  She  presented to the clinic secondary to  blues under her arms that started two days prior to admission.  She  noticed the day of admission, she was feeling weak and started having  fevers and chills along with dizziness.  She noted two episodes of falls  in the 24 hours prior to admission.  She also noted increased urinary  frequency over the lat two days, but attributes it to starting Lasix two  days ago.   ADMISSION VITALS:  Temperature 37 degrees Celsius, blood pressure 95/59,  pulse 103, respiratory rate 26, O2 saturation 98% on room air.   PHYSICAL EXAMINATION:  GENERALLY:  The patient was very diaphoretic,  alert.  EYE EXAM:  The left pupil was slightly more dilated than the right,  equally reactive to light.  Extraocular motions were intact and they  were nonicteric.  ENT EXAM:  She had moist mucous membranes.  No oropharyngeal exudates.  RESPIRATORY EXAM:  Mild bilateral expiratory wheezing,  otherwise clear  and mildly tachypneic.  CARDIOVASCULAR EXAM:  Tachycardic at 110 with a regular rhythm and no  murmurs.  ABDOMINAL EXAM:  Good bowel sounds.  Soft, nontender, nondistended,  obese abdomen.  EXTREMITY EXAM:  Right axilla with firm warmth and exquisitely tender  area to palpation.  The left axilla had two areas that were  approximately a centimeter each, one with a significant pustule and one  with a nodule.   ADMISSION LABS:  CBC:  White blood cell count 19.8 with an absolute  neutrophil count of 16.3, hemoglobin 10.7, MCV 94.0, platelets 206.  Troponin less than 0.05, myoglobin 294, CK-MB less than 1.  C-Met:  Sodium 133, potassium 4.2, chloride 104, bicarb 19, BUN 16, creatinine  1.66, glucose 97.  Bilirubin 1.1, alkaline phosphatase 102, AST 19, ALT  18, total protein 6.2, albumin 2.9, calcium 8.2.  PT 15.9, INR 1.2, PTT  34.  Urinalysis with normal limits.   DIAGNOSTIC IMAGING:  Chest x-ray with no acute findings.   CT without contrast of the head with no evidence  for acute intracranial  abnormality.   Ultrasound of the right extremity with the right axillary subcutaneous  edema and small 1 1/2 cm fluid collection concerning for small abscess.   HOSPITAL COURSE:  1. Multiple abscesses.  Surgery was consulted and I&D was performed of      her right axilla, left axilla and lesion on the sternum by surgery.      The patient was started on IV vancomycin and Zosyn secondary to her      history of MRSA skin infections.  On admission, the patient was      febrile with a temperature up to 104 degrees over her first night.      Critical care was consulted secondary to hypotension.  The      patient's blood pressure responded quickly with IV fluids and      critical care subsequently signed off.  Wound culture of her      bilateral axillary abscesses both grew out MRSA that was sensitive      to doxycycline and Bactrim.  When the patient was afebrile for over      24 hours and her white count was trending down, she was switched to      doxycycline and remained afebrile through her hospitalization.  Her      pain was well controlled with Vicodin and surgery recommended home      health wound care that was arranged for her.  A CT scan of the      chest was obtained secondary to the patient being febrile after 48      hours on antibiotics, and we were concerned that she might have      another abscess deeper and it showed an irregular inflammatory      process around where the I&D site was along with bubbles of air in      the soft tissue.  There were no defined fluid collections.  2. Early sepsis.  The patient came in through the clinic and had      systolics in the 70s and 80s range.  Patient was given aggressive      IV fluids and critical care was consulted.  Her blood pressure was      responsive to IV fluid resuscitation and she did not require any      additional intervention.  She was started on vancomycin and Zosyn  for presumed infection.  3.  Falls.  Patient fell the night before admission along within the      parking lot and the clinic prior to admission.  A CT scan of her      head was negative.  It was felt that this fall was secondary to      hypotension.  4. Hypokalemia.  Patient had a low potassium of 3.0 and this was      attributed to diarrhea that she had after she started IV      antibiotics.  Her potassium responded to p.o. supplementation.  5. Hypertension.  The patient's blood pressure medications were held      secondary to being hypotensive on admission.  She then became      hypertensive and her home dose of lisinopril was added at 40 mg      with her blood pressure on discharge being 152/84.  6. Hyperlipidemia.  Patient's statin was continued during this      hospitalization.  7. Depression.  Patient's Celexa was continued during this      hospitalization.  8. Gout.  Patient's allopurinol was continued during this      hospitalization.  9. Normocytic anemia.  Patient's hemoglobin dropped from 10.7 to 8.3      during this hospitalization.  An anemia panel was done and her iron      was 10, TIBC 170, percent saturation 6, B12 379, folate 9.6 and      ferritin 184.  This was felt to be secondary to anemia of chronic      disease along with aggressive fluid hydration.  The patient was      FOBT-negative x1.  10.History of hypothyroidism.  A TSH was checked during this      hospitalization and was within normal limits at 0.708.   DISCHARGE VITALS:  Temperature 98.5, blood pressure 152/84, pulse 89,  respiratory rate 20, O2 saturation 96% on room air.   DISCHARGE LABS:  BMP:  Sodium 137, potassium 4.3, chloride 108, bicarb  24, BUN 11, creatinine 1.0, glucose 101, calcium 8.6.  CBC:  White blood  cell count 9.0, hemoglobin 8.3 with an MCV of 92.9, platelet count 292.      Joaquin Courts, MD  Electronically Signed     VW/MEDQ  D:  09/12/2007  T:  09/12/2007  Job:  [id number]

## 2011-03-14 NOTE — Letter (Signed)
December 11, 2007   Morrell Riddle, MD  [address]  [place], [place]  [address]   Re:  [name], [name]              DOB:  Feb 25, 1941   Dear Dr. Liliane Channel   [name] came for followup today.  Her CT scan showed no evidence of  recurrence or change in her right lower lobe spiculated lesion.  Her  blood pressure is 152/91, pulse 99, respirations 22.  Saturations were  99%.  Since it has been over two years and there is some calcification  in this area, I feel this is probably a benign situation.  I will refer  her back to you for long term followup.   Ines Bloomer, M.D.  Electronically Signed   DPB/MEDQ  D:  12/11/2007  T:  12/12/2007  Job:  [id number]

## 2011-03-14 NOTE — Op Note (Signed)
NAMEMarland Kitchen  RASHAD, OBEID NO.:  [id number]   MEDICAL RECORD NO.:  [id number]          PATIENT TYPE:  INP   LOCATION:  4743                         FACILITY:  [facility]   PHYSICIAN:  Ardeth Sportsman, MD     DATE OF BIRTH:  [birth year]   DATE OF PROCEDURE:  09/06/2007  DATE OF DISCHARGE:                               OPERATIVE REPORT   PRIMARY CARE PHYSICIAN:  [name], M.D.   SURGEON:  Ardeth Sportsman, MD.   PREOPERATIVE DIAGNOSIS:  History of methicillin-resistant Staphylococcus  aureus and possible hidradenitis suppurativa with skin abscesses on  sternal region of chest, left axilla and in right axilla.   POSTOPERATIVE DIAGNOSIS:  History of methicillin-resistant  Staphylococcus aureus and possible hidradenitis suppurativa with skin  abscesses on sternal region of chest, left axilla and in right axilla.   PROCEDURE PERFORMED:  Incision and drainage of abscesses in right  axilla, left axilla and sternum.   ANESTHESIA:  1. General anesthesia.  2. Local anesthetic in a field block.   SPECIMENS:  Right axilla, tissue and swab for culture.   DRAINS:  None.   ESTIMATED BLOOD LOSS:  30 mL.   COMPLICATIONS:  No major complications.   INDICATIONS:  Mrs. Mckenny is a 70 year old morbidly obese female with  numerous health issues and questionable hidradenitis who underwent  incision and drainage of a sternal chest wound and improved on oral  antibiotics.  However, she developed fevers and chills and was admitted  for concerns of some mild shock.  She had evidence of skin abscesses.  She has been placed on antibiotics.  Surgical consultation was  requested, and we felt that incision and drainage would be appropriate.  Risks such as stroke, heart attack, deep venous thrombosis, pulmonary  embolism, death were discussed.  Risks such as bleeding, need for  transfusion, wound infection, abscess, injury to other organs were  discussed as well.   OPERATIVE FINDINGS:   She had small subcutaneous skin boils, 2 in her  left axilla, 1 on her anterior chest and 1 in her right axilla on her  proximal arm.  She had a more firm area on the chest wall side of her  right axilla, more consistent with cellulitis, although there might be a  moderate sized abscess cavity.   DESCRIPTION OF PROCEDURE:  Informed consent was confirmed.  Patient was  already on IV vancomycin and Zosyn.  She underwent general anesthesia  without any difficulty.  Bilateral axillae and chest were prepped and  draped in sterile fashion.   Field blocks were placed under the regions of concern.  I went and did  an 8-mm biconcave incision over the skin abscesses and got rid of a  small very tiny pocket of pus.  A similar I&D was done in the left  axilla x2 and on the right axilla on the arm side as well.  In the  axilla on the chest wall, she had a larger region of about 10 x 8 cm of  induration and tenderness.  Seeker needle suggested a deeper packet.  Therefore I did about a  2-cm oblique incision and encountered sort of a  deeper region of cellulitis and maybe some abscess as well.  I went  ahead and packed 2-1/2 feet of Nu Gauze ribbon into this region.   Note:  The patient was rather oozy because she has been on Plavix.  Ultimately hemostasis was good.  Nu Gauze was packed into the smaller  abscesses in the axilla on the right and left sides, medium sized on the  right and left sides.  The one on the chest wound was too superficial to  pack.  She was extubated and sent to the recovery room in stable  condition.  She tolerated the procedure well.      Ardeth Sportsman, MD  Electronically Signed     SCG/MEDQ  D:  09/06/2007  T:  09/07/2007  Job:  [id number]   cc:   [name], M.D.

## 2011-03-14 NOTE — Letter (Signed)
June 12, 2007   Charlaine Dalton. Sherene Sires, MD.  [address]  [place], [place], [address]   Re:  AVAREE, GILBERTI              DOB:  Nov 18, 1940   Dear Kathlene November:   I saw Mrs. Portee back for followup today for her lung lesion and the  right lower infrahilar mass, which was negative on PET scan.  We  repeated her CT scan today and it showed no major change.  We have been  following this now for approximately 15 months, and I will see her again  in six months with another CT scan for final followup.  Her blood  pressure is 122/79, pulse 96, respirations 18, and SATs were 97%.   Ines Bloomer, M.D.  Electronically Signed   DPB/MEDQ  D:  06/12/2007  T:  06/13/2007  Job:  [id number]

## 2011-03-17 ENCOUNTER — Encounter: Payer: PRIVATE HEALTH INSURANCE | Admitting: Internal Medicine

## 2011-03-17 NOTE — Op Note (Signed)
NAMEMarland Kitchen  [name], [name]              ACCOUNT NO.:  [id number]   MEDICAL RECORD NO.:  [id number]            PATIENT TYPE:   LOCATION:                                 FACILITY:   PHYSICIAN:  Mark C. Ophelia Charter, M.D.         DATE OF BIRTH:   DATE OF PROCEDURE:  DATE OF DISCHARGE:                                 OPERATIVE REPORT   PREOPERATIVE DIAGNOSES:  Cervical spinal stenosis with myelopathic changes  and cord edema C3-C4, C4-C5.  Left trigger thumb.   POSTOPERATIVE DIAGNOSES:  Cervical spinal stenosis with myelopathic changes  and cord edema C3-C4, C4-C5.  Left trigger thumb.  Right vertebral artery injury at C4-C5 level.   SURGEON:  Mark C. Ophelia Charter, M.D.   ANESTHESIA:  General endotracheal.   ESTIMATED BLOOD LOSS:  200 cc.   PROCEDURE:  After induction of general anesthesia with orotracheal  intubation, the patient received preoperative Ancef.  Standard prep and  drape was performed using sterile Mayo stand at the head.  Had ultratraction  but no traction applied.   The patient had C3-C4 stenosis with 6-mm AP diameter with some cord edema  and had a history of falling.  She also had problems with left trigger thumb  and requested it be taken care of at the same operative setting.   Incision was made starting at the midline, extending to the left, after a  sterile skin marker, Betadine prep, and Vi-Drape had been applied.  A  prominent skin fold was used and palpable skin landmarks were used.  Cricothyroid, carotid tubercle for palpation and expected incision line.  Platysma was divided in line with the fibers.  Suprathyroid artery was  identified and double 4-0 silk ligatures were placed on both sides and then  the artery was cut.  Superior vein was also ligated, as well, single on each  side and then coagulated with cautery.  Prominent spur was noted and 3, 4, 5  expected levels identified with short 25 needle placed in C3-C4 and a cross-  table lateral C-arm picture confirming  this.  Diskectomy was performed.  There were thick spurs present with the spur from C3 overlying the end plate  of C4 in the typical stenotic-type pattern.  As soon as the operative  microscope was draped and brought in, the posterior aspect of the disk was  removed using microdissection, burring down, thinning down.  Then, once it  was about 1 mm thick, it was removed under microscope magnification using 1  and 2-mm Kerrisons.  As soon as some of the spur was removed, there was a  large amount of disk material present behind this which was removed with  microdissection.  It was teased with a blunt nerve hook, grasped with  pituitary and removed with the 1-mm Kerrison.  Once cord was completely  visualized and decompressed, sizer showed that a 7-mm graft was the  appropriate size.  After sizer was inserted, had ultratraction and was  pulled by the CRNA and the graft was tapped into place in good position.   Identical procedure was  then performed at C4-C5.  At this level, there was  less severe stenosis.  No cord malacia.  Posterior longitudinal ligament was  taken down and some spurs were being debrided back with a 1-mm Kerrison to  grab at the piece that the Cloward curet, had stripped loose off the  uncovertebral joint.  As this was pulled, there was blood on the right side  with loss of 150 to 200 cc.  Immediately, some sponges were placed and some  FloSeal was mixed.  Once two patties were placed down there, blood loss  completely stopped.  Some FloSeal was mixed with some Surgicel and after 5  minutes, the patties were removed.  There was slight amount of bleeding and  the Surgicel was packed laterally with the FloSeal to the right side.  There  was concern that this was likely injury to the medial wall of the vertebral  artery.  With packing in place, there was no blood loss.  Anethesia Bis  monitor was unchanged.  There was no tachycardia and no drop in blood  pressure.  Some final  finishing, removing some tiny spurs was performed,  leaving the packing in place and the grafts inserted, and it was  eccentrically pushed over to the right side, so it was over next to the area  where the packing was present.  It was pushed in slightly deeper than the  other graft, but there was still 2 to 3 mm posterior to the graft where the  cord was located.  Graft was stable and would not move.  The 4-5 graft was a  6 mm.  A phone call was made on Dr. Fatima Sanger pager and I spoke with the  new neurovascular interventional radiologist who recommended a CTA.  The  patient would be set up for this once she got to the PACU.  Once the graft  was in good position, operative field was completely dry.  The patient was  bagged up to 30 cm.  There was no bleeding, arterial or venous.  A 32-mm EBI  Biomet plate was selected.  Holes hand drilled.  All six screw holes were  filled.  Again, inspection showed dry area.  Hemovac was placed through a  separate stab incision with the trocar in-out technique in line with the  skin incision.  Platysma closed with 3-0 Vicryl, 4-0 Vicryl subcuticular  closure.  Tincture of benzoin, Steri-Strips, postop dressing, soft cervical  collar.   Next, her left arm was placed on an arm board and was prepped.  Incision was  made over the A1 pulley.  Blunt dissection in the midline.  Small Ragnells  right and left.  Flexor tendon sheath was visualized in midline.  A small  nick was made with a #15 scalpel and then extended with Andria Meuse scissors.  There were some changes in the midline of the tendon where cortisone had  been injected and the thumb was able to flex and extend.  A1 pulley was  completely released.  After irrigation, tourniquet time was 10 minutes and  three simple sutures were placed followed by postop dressing and Ace.  The  patient was then transferred to the recovery room, extubated.  Neurologic exam showed she was moving all extremities.  Biceps,  triceps, deltoid,  brachioradialis were intact.  She complained of some numbness in her thumb  where the local had been applied at the A1 pulley, as expected postop.  She  had good motion of her legs  and CTA was performed which has not had complete  read out, but I went down to radiology and went over it with Dr. Jena Gauss.  She has a very small vertebral artery on the right which is smaller than the  left.  Old MRI was reviewed which did show the small artery, but she did  have flow and on the CTA she has retrograde flow but does not have flow at  the C4-C5 level, most likely due to the packing.  There was flow distally,  as well as proximally, which appeared retrograde.  Screws were in good  position with satisfactory decompression at the C3-C4 severe level and also  C4-C5, and the graft was eccentric placed to the right as expected so it  would sit up against the area of packing to prevent any further bleeding.  The patient will remain in the PACU overnight.  Type and screen was made and  CT of her head showed mild preexisting atrophy, otherwise normal with good  flow at the circle of Willis and retrograde flow down the vertebrae.  Instrument count and needle count was correct.      Mark C. Ophelia Charter, M.D.  Electronically Signed     MCY/MEDQ  D:  04/23/2006  T:  04/24/2006  Job:  [id number]

## 2011-03-17 NOTE — Op Note (Signed)
NAME:  [name], [name]                        ACCOUNT NO.:  [id number]   MEDICAL RECORD NO.:  [id number]                   PATIENT TYPE:  INP   LOCATION:  5028                                 FACILITY:  [facility]   PHYSICIAN:  Mark C. Ophelia Charter, M.D.                 DATE OF BIRTH:  1941-01-02   DATE OF PROCEDURE:  08/06/2003  DATE OF DISCHARGE:                                 OPERATIVE REPORT   PREOPERATIVE DIAGNOSIS:  Right hip osteoarthritis.   POSTOPERATIVE DIAGNOSIS:  Right hip osteoarthritis.   OPERATION PERFORMED:  Right total hip arthroplasty.   SURGEON:  Mark C. Ophelia Charter, M.D.   ASSISTANT:  [name]. Denton Meek.   ANESTHESIA:  General orotracheal anesthesia.   ESTIMATED BLOOD LOSS:  .   COMPONENTS USED:  Osteonics #5 cement Eon stem.  +0 ball, 50 mm Trident cup,  +10 degree wall cross-fire poly.   DESCRIPTION OF PROCEDURE:  After induction of general anesthesia and  orotracheal intubation, the patient was placed in lateral position and  stabilized with a Mark VII frame.  Preoperative Ancef was given.  The hip  was prepped with DuraPrep __________ impervious stockinette, Coban, half  sheets, split sheets, impervious split sheet and Betadine Vi-drape  application was applied after sterile skin marker.  Posterior approach was  made.  The patient was obese.  There was a thick layer of gluteus maximus  region fat.  Charnley retractor was placed after maximus was split in line  with the fibers and the tensor fascia was extended.  Due to the thickness of  the patient's buttocks, incision was extended proximally and distally for  adequate exposure.  The piriformis was tagged, then cut.  Posterior capsule  was released.  Large Steinmann pin was placed laterally in the pelvis  perpendicular to the floor.  A drill bit was placed in the trochanter.  Measurement between the two was 6 cm.  Drill bit was removed and neck was  then cut one fingerbreadth above the lesser trochanter.  The  patient had a  very stiff hypertrophic arthritic hip.  The ball could not be removed and  had to be cut into sections until finally a corkscrew was placed in the  extremely hard sclerotic bone.  Ligamentum was spun multiple times and then  the remaining portion of the hip was removed.  There was significant erosion  and flattening of the head with cyst formation in the acetabulum present.  Large osteophytes were removed from the acetabulum.  The acetabulum was  trimmed of some remaining labrum.  The femur was prepared first with canal  starter followed by trochanteric reamer, progressive reaming up to a cement  5 size broaching.  Acetabulum was then reamed up to 50.  Trials were  inserted.  The patient had been lengthened 5 mm and by standing x-ray  preoperatively was 8 mm short and this was acceptable.  There  was excellent  stability, full extension, flexion of the knee at 140 degrees.  Flexion of  the hip to 90 degrees with internal rotation to 85 degrees without  subluxation.  No trochanteric impingement.  Trials were removed.  Pulsatile  lavage was performed.  Acetabulum was then inserted, press-fit 50 mm.  There  was slight overhang laterally, slight overhang posterior and slight inset on  the anterior aspect with 45 degrees of abduction and cup flexion pointed  toward the corner of the rim.  This was not compared with standing lateral  to the pelvis preoperatively which showed ASIS-II sciatic notch line at 20  degrees.  Central hole was filled.  Polyethylene was popped in with the 10  degree wall posterolateral.  A sponge was placed to protect any cement from  getting into the polyethylene socket.  Pulsatile lavage was used for  preparation of the femur.  The cement was vacuum mixed and impacted with a  glue gun, finger packed, prosthesis inserted.  The cement was hard by 13  minutes.  All excess cement had been removed and proper anteversion was  maintained on the femur.  Cement was  hard and in solid position.  A +0 ball  was placed.  Identical findings and good stability.  Piriformis repaired to  gluteus medius with #1 Ethibond suture.  A #1 Ethibond in the tensor fascia.  0 Vicryl in the gluteus maximus fascia.  2-0 Vicryl in the subcutaneous  tissue and skin staple closure.  Marcaine infiltration, postop dressing.  Tape and knee immobilizer.  Instrument count and needle count was correct.                                                Mark C. Ophelia Charter, M.D.    MCY/MEDQ  D:  08/06/2003  T:  08/06/2003  Job:  [id number]

## 2011-03-17 NOTE — Op Note (Signed)
NAME:  [name], [name]              ACCOUNT NO.:  [id number]   MEDICAL RECORD NO.:  [id number]          PATIENT TYPE:  AMB   LOCATION:  ENDO                         FACILITY:  [facility]   PHYSICIAN:  Bernette Redbird, M.D.   DATE OF BIRTH:  June 01, 1941   DATE OF PROCEDURE:  09/01/2004  DATE OF DISCHARGE:                                 OPERATIVE REPORT   PROCEDURE:  Colonoscopy with biopsy.   INDICATIONS:  A 70 year old female for followup of previous colonic polyps  of indeterminate histology, removed by another physician.   FINDINGS:  Normal examination, except tiny rectal polyps which were  biopsied.   DESCRIPTION OF PROCEDURE:  The patient provided written consent for the  procedure.  She had already received Ancef 1 g IV as antibiotic prophylaxis,  because of a previous hip replacement.  Sedation for this procedure and the  upper endoscopy which preceded it totalled fentanyl 100 mcg and Versed 12.5  mg IV; without arrhythmias or desaturation.  She was somewhat combative  during the upper endoscopy portion of today's procedures, but that was not a  problem during the colonoscopy.   The Olympus adult video colonoscope was readily advanced to the terminal  ileum, which had a normal appearance, and pullback was then performed.  The  quality of the prep was quite good and it is felt that all areas were  adequately seen.   Other than numerous (approximately 15) small sessile 1-2 mm hyperplastic-  appearing polyps in the rectum, several of which were sampled by cold biopsy  technique, this was a normal examination.  Retroflexion was attempted, but  could not be accomplished in the rectum.  No large polyps, cancer, colitis,  vascular malformations or diverticulosis were noted.   The patient tolerated the procedure well and there were no apparent  complications.   IMPRESSION:  1.  Diminutive rectal polyps (211.4).  2.  Prior history of colon polyps of indeterminate histology.   PLAN:   Await pathology on current biopsies, with consideration for flexible  sigmoidoscopy or full colonoscopy, more likely the latter, in five years.       RB/MEDQ  D:  09/01/2004  T:  09/01/2004  Job:  [id number]   cc:   Hillery Aldo, M.D.  Int. Med. - Resident - [address]  [place], [place] [address]  Fax: [phone]

## 2011-03-17 NOTE — Discharge Summary (Signed)
NAME:  [name], [name]                        ACCOUNT NO.:  [id number]   MEDICAL RECORD NO.:  [id number]                   PATIENT TYPE:  INP   LOCATION:  5028                                 FACILITY:  [facility]   PHYSICIAN:  Mark C. Ophelia Charter, M.D.                 DATE OF BIRTH:  13-Oct-1941   DATE OF ADMISSION:  08/06/2003  DATE OF DISCHARGE:  08/13/2003                                 DISCHARGE SUMMARY   FINAL DIAGNOSES:  1. Status post  right total hip arthroplasty for osteoarthritis, August 06, 2003.  2. Urinary tract infection, not otherwise specified.  3. Post hemorrhagic anemia.  4. Gout not otherwise specified.  5. Obesity.  6. Hypertension not otherwise specified.  7. Esophageal  reflux.  8. Long-term use of anticoagulants.  9. Proteus infection, not otherwise specified.  10.      Diaphragmatic hernia.   HISTORY OF PRESENT ILLNESS:  The patient is a 70 year old black female with  a history of  right hip osteoarthritis and chronic pain who presented to our  office for a preoperative operation for right total hip arthroplasty. She  had progressively worsening pain with no response  to conservative  treatment. She had a significant decrease in her daily activities due to  ongoing complaint. Preoperative x-ray showed bone-on-bone changes of the hip  with marginal osteophyte changes and subchondral sclerosis.   LABORATORY DATA:  Preadmission labs. WBC 7.3, hemoglobin 12.3, hematocrit  37.2, platelets 249. PT 12.9, INR 1.0, PTT 27. Sodium 137, potassium 4.0,  chloride 104, CO2 24, glucose 108, BUN 13, creatinine 1.1, calcium 10.2.  Total protein 7.3, albumin 4.0, AST 20, ALT 13, ALP 76, total bilirubin 0.2.  Urinalysis negative.   HOSPITAL COURSE:  On August 06, 2003, the patient was taken to the [facility] operating room and a right total hip arthroplasty procedure  was performed. The surgeon was [employer]. Ophelia Charter, M.D. and assistant [name].  Barry Dienes, P.A.-C.  Anesthesia was general with estimated blood loss 700 mL.  There were no surgical or anesthesia complications and the patient was  transferred to the recovery room in stable condition.   On August 07, 2003, the patient was started on  pharmacy protocol Coumadin  for DVT prophylaxis. She had good pain control. Temperature  was 98.7, blood  pressure 112/66, pulse 91, respirations 18, hemoglobin 7.1. PT 15.1, INR  1.3. Dressing bloody. Transfused 1 unit packed red blood cells now and then  another August 08, 2003. Dressing changed.   On August 08, 2003, the patient was stable. Leg length equal. Hemoglobin 8.5  after 1 unit. Creatinine  1.3, INR 1.5. A 2nd unit given of packed red blood  cells.   August 09, 2003, the patient was stable. Leg length equal. Hematocrit 27,  WBC 10.8, INR 1.6. Sodium 129, creatinine 1.2, calcium 7.6. The patient was  ordered magnesium  citrate for no bowel movement.   August 10, 2003, good pain control. No bowel movement. The patient refused  magnesium citrate the day before. Vital signs stable, afebrile. PT 19.0, INR  2.0. Hemoglobin  8.9. Dressing with a slight serosanguinous drainage. Bed to  chair ordered b.i.d. The patient was evaluated by rehabilitation.   August 11, 2003, the patient moving slow without therapy. She complained of  substernal chest discomfort  with swallowing. She has a history of hiatal  hernia. No chest pain or abdominal pain. Vital signs stable with temperature  99. PT 21.2, INR 2.4. Hemoglobin  8.8. ALT 144, AST 53. Albumin 2.2, total  protein 5.6. Reglan 10 mg p.o. q.6h. ordered. Venous Doppler  to rule out  deep venous thrombosis for right calf tenderness. Dulcolax suppository.  Urinalysis and urine CNS ordered. The patient was encouraged to work harder  with physical therapy.   August 12, 2003, good pain control. The patient had good bowel movement.  Temperature 100.0, blood pressure 151/85. PT 22.0, INR 2.6. Hemoglobin   pending. Urinalysis  positive for UTI. The patient was started Cipro 250 mg  p.o. b.i.d. x5 days.   August 13, 2003, the patient complained of burning substernal chest pain.  No radiation, no shortness of breath. Vital signs stable, afebrile. Urine  culture positive for Proteus mirabilis. Venous Doppler negative for DVT.  Cardiac enzymes ordered and  EKG. The patient has no previous history of cardiac issues. The patient  tender at the pectoralis origin on the sternum. Possible pain from muscle  soreness from using a walker. Later that morning a rehabilitation bed became  available and the patient was ready for transfer and she was agreeable.   DISCHARGE MEDICATIONS:  She is to continue all current medications.   DISPOSITION:  Transfer to inpatient rehabilitation.   CONDITION ON DISCHARGE:  Good and stable.   DISCHARGE INSTRUCTIONS:  The patient will continue to work with physical  therapy to improve ambulation and hip strengthening. Rehabilitation will  follow up on the patient's labs that were ordered this morning. Staples to  be removed 2 weeks postoperatively. Dressing changes p.r.n. She will remain  on Coumadin for DVT prophylaxis for 3 to 4 weeks postoperatively.   FOLLOW UP:  She will follow up in our office 1 week after discharge  for  rehabilitation for repeat x-rays. If there are any  problems or  complications before that time, our office can be reached at [phone].      [name]. Denton Meek.                      Mark C. Ophelia Charter, M.D.    JMO/MEDQ  D:  08/25/2003  T:  08/25/2003  Job:  [id number]

## 2011-03-17 NOTE — Op Note (Signed)
Shelby [name], [name]              ACCOUNT NO.:  [id number]   MEDICAL RECORD NO.:  [id number]          PATIENT TYPE:  INP   LOCATION:  2550                         FACILITY:  [facility]   PHYSICIAN:  Mark C. Ophelia Charter, M.D.    DATE OF BIRTH:  [birth year]   DATE OF PROCEDURE:  11/25/2004  DATE OF DISCHARGE:                                 OPERATIVE REPORT   PREOPERATIVE DIAGNOSIS:  Left hip osteoarthritis.   POSTOPERATIVE DIAGNOSIS:  Left hip osteoarthritis.   OPERATION PERFORMED:  Left total hip arthroplasty, cement femur, Press-Fit  acetabulum.   SURGEON:  Mark C. Ophelia Charter, M.D.   ASSISTANT:  Vanita Panda. Magnus Ivan, M.D.   ANESTHESIA:  General orotracheal anesthesia plus Marcaine skin local 15 mL.   ESTIMATED BLOOD LOSS:  200 mL.   DRAINS:  None.   DESCRIPTION OF PROCEDURE:  After induction of general anesthesia with  orotracheal intubation, the patient was placed in lateral position with  Ancef prophylaxis, Mark 7 frame localization, 1015 drape with tincture of  benzoin and DuraPrep.  Impervious stockinette, total hip sheets and drapes,  Betadine Vi-drapes x2 using SealSkin as sterile skin marker. Posterior  approach was made.  Gluteus maximus was split in line with the fibers.  Piriformis was tagged and then cut.  Posterior capsule was excised.  Large  Steinmann pin was placed in the pelvis and a drill bit in the trochanter for  measurements.  Posterior capsule was opened, resected.  Hip was internally  rotated and due to the patient's large osteophytes and hip tightness, the  neck had to be cut before the hip could be dislocated.  There were large  marginal osteophytes on the acetabulum which were resected.  Grade 4  chondromalacia with severe erosion of the head.  Head had to be cut into  pieces to be removed.  Neck was then resected one fingerbreadth above the  lesser trochanter.  Femur was prepared first with canal starter reamer, size  1 reamer, 2 reamer and then trochanteric  reamer.  Size progressed up to #4  reamer and then broaching progressed up to #4.  DePuy cemented stem was  planned and 4 x 17 sponge was placed in the canal.  Sequential reaming of  the acetabulum was performed up to the 52.  Trials were inserted.  Bone was  reamed all the way down to the fovea.  Marginal osteophytes were resected.  There was anterior impingement that required some resection of some of the  overhanging spurs off the trochanter anteriorly as well as off the  acetabulum.  This improved stability significantly with trials.  No  trochanteric impingement with abduction.  __________ was then inserted,  placed in 45 degrees of abduction with 20 to 25 degrees of cup flexion.  Marginal osteophytes were resected.  Central hole was plugged.  This was a  no-holes cup.  A 10-degree liner was placed posterior superior.  Cement was  then prepared and pulsatile brushing with multiple sponges, drying of the  canal and the femur, insertion of the distal cement restricter was  performed.  Cement gun was used after  a vacuum mixing.  Stem was inserted.  It was impacted down flush with the calcar.  This excessive cement was  removed.  Cement was hardened 13 minutes.  Permanent 32 mm ball +1 neck was  inserted.  Hip was reduced.  Anterior capsule was still tight and a portion  of the anterior capsule was released which was consistent with hip flexion  contracture.  Adductor was a little bit tight but she did not require  adductor release.  The hip could be flexed 90 degrees, internally rotated 80  degrees and adducted before the hip would sublux and dislocate.  There was  tightness with extension.  Hip stable in full extension, external rotation.  Piriformis repaired to the gluteus medius, followed by tensor fascia repair  with #1 Vicryl.  2-0 Vicryl placed in the subcutaneous tissue, skin staple  closure.  Postoperative dressing, Xeroform, 4 x 4s, ABD, and tape.  Leg  lengths are equal and the  patient was turned supine and a knee immobilizer  applied.  Instrument count and needle count was correct.      MCY/MEDQ  D:  11/25/2004  T:  11/25/2004  Job:  [id number]

## 2011-03-17 NOTE — Discharge Summary (Signed)
NAME:  [name], [name]              ACCOUNT NO.:  [id number]   MEDICAL RECORD NO.:  [id number]          PATIENT TYPE:  INP   LOCATION:  5001                         FACILITY:  [facility]   PHYSICIAN:  Mark C. Ophelia Charter, M.D.    DATE OF BIRTH:  12-26-1940   DATE OF ADMISSION:  04/23/2006  DATE OF DISCHARGE:  04/27/2006                                 DISCHARGE SUMMARY   ADMISSION DATE:  April 23, 2006   DISCHARGE DATE:  April 27, 2006   FINAL DIAGNOSIS:  Cervical spinal stenosis.   ADDITIONAL DIAGNOSES:  1.  Intraoperative laceration vertebral artery.  2.  Trigger finger.  3.  Hypertension.  4.  History of tobacco use.  5.  Obesity.  6.  Esophageal reflux.  7.  Sleep apnea.   This 70 year old female with cervical spinal stenosis, progressive, giving  away falling with weak biceps, triceps, and cord compression of C3-4 at 6 mm  with cord myelomalacia by MRI scan, was admitted for cervical decompression  and fusion with allograft and plating, as well as left trigger thumb  release.   Admission medications include:  1.  Tramadol 50 q.8.  2.  Allopurinol.  3.  Citalopram HBr 40 mg daily.  4.  Chewable aspirin 81 mg daily.  5.  Lisinopril 40 mg.  6.  Hydrochlorothiazide 25 mg daily.  7.  Premarin 0.625 mg daily.  8.  Vitamin C 500 mg daily.  9.  Calcium 600 +D daily.  10. Piroxicam 20 mg daily.  11. Plavix 75 mg p.o. daily, stopped 3 days before surgery..  12. Protonix 40 mg p.o. daily.  13. Klor-Con 10 mEq 2 p.o. daily.  14. Lipitor 40 mg daily.  15. Amitriptyline 50 mg nightly.  16. Over-the-counter stool softener.   The patient weighs 105 kg.   HOSPITAL COURSE:  The patient was admitted and after informed consent,  including handwritten note with risks of bleeding, infection, nerve damage,  paralysis, dural tear, spinal fluid leak, fracture, and pseudoarthrosis, as  well as reoperation were discussed with the patient.  She was taken to the  operating room and underwent the  above procedure.  Intraoperatively she had  laceration on the right at C4-5, which occurred with a piece of bone after  curetting it, as being grasped with a 1-mm Kerrison as the piece of bone was  pulled loose, the spike of the bone damaged the vertebral artery.  Packing  was immediately placed on it.  There was some blood loss of 200 mL.  Intraoperative combination of some Surgicel and other local agents were  placed with bone graft, placed slightly eccentric over the side.  The BIS  monitor showed no change.  The operative field was dry and the procedure was  finished.  She was stable and since there was good stability, the trigger  thumb was taken care of as well.  I spoke with the on-call interventional  vascular radiologist, who works with Dr. Corliss Skains, who arranged for an  angio CT, which showed a rather small vertebral artery on its side.  I  viewed the scans  with the radiologist which showed small vertebral artery  and stoppage of flow at the level where the packing was placed and head CT  was normal.  The patient was kept in intensive care with recurrent  neurovascular checks and had no problems.  CPAP was used postoperatively.  She was ambulatory.  Trigger thumb had good treatment with no further  triggering.  She was improved with leg strength sensation and was ambulatory  up and down the halls at the time of discharge.  Hemoglobin was stable.  Home nurse was ordered, as well as a bath aide, since she stays with at home  and has a grandson who comes in to check her.  Condition at discharge was  stable.  I discussed with patient, intraoperative vertebral artery  laceration, as well as with the family member.  She had good carotid flow,  good opposite side vertebral flow, and retrograde flow down the vertebral  artery down to the level where the packing was placed at the area of the  intraoperative artery laceration.  Condition at discharge was improved.      Mark C. Ophelia Charter,  M.D.  Electronically Signed     MCY/MEDQ  D:  05/23/2006  T:  05/23/2006  Job:  [id number]

## 2011-03-17 NOTE — Discharge Summary (Signed)
[name], [name]              ACCOUNT NO.:  [id number]   MEDICAL RECORD NO.:  [id number]          PATIENT TYPE:  INP   LOCATION:  5016                         FACILITY:  [facility]   PHYSICIAN:  Mark C. Ophelia Charter, M.D.    DATE OF BIRTH:  April 12, 1941   DATE OF ADMISSION:  11/25/2004  DATE OF DISCHARGE:  11/28/2004                                 DISCHARGE SUMMARY   FINAL DIAGNOSIS:  Left hip osteoarthritis.   PROCEDURE:  Left total hip arthroplasty.   SECONDARY DIAGNOSES:  1.  History of ovarian malignancy.  2.  Hypertension.  3.  Gout.  4.  Obesity.   This 70 year old female has been followed for progressive left hip  osteoarthritis who failed conservative treatment.  Hips show minimal range  of motion.  No hip flexion or contracture.  10 degrees internal rotation  with bone on bone changes on plain radiographs.   Admission medications are as outlined in her H&P including HCTZ, potassium  chloride, Lipitor, Allopurinol, lisinopril, Protonix, Plavix, Premarin,  Celexa, Elavil, baby aspirin, calcium, vitamin D, stool softener and  diltiazem CD.   HOSPITAL COURSE:  After informed consent, the patient was taken to the  operating room and underwent a left total hip arthroplasty, cemented femur,  Press-Fit acetabulum.  She had a Summit femoral stem, #4 size, standard  offset, 32 mm internal diameter, 52 mm external diameter, Marathon,  Pinnacle, DePuy, acetabular liner, 10 degree lip.  A 52 mm acetabular shell.  She had an __________/EZE femoral head plus one.   Postoperatively, the patient had routine postoperative course.  Hemoglobin  was decreased to 8.0.  She was transfused 2 units of blood.  Leg lengths  were equal.  She ran a low-grade fever.  Switched from morphine to p.o. pain  medication.   Plan for [facility] admission.  Legs lengths were equal __________ was 1.4.  Plan  for Coumadin times four weeks postop.  She is being followed by OT, as well  as PT.   Condition at transfer  was satisfactory.  Office followup in one week.     MCY/MEDQ  D:  01/27/2005  T:  01/27/2005  Job:  [id number]

## 2011-03-17 NOTE — Op Note (Signed)
NAME:  [name], [name]              ACCOUNT NO.:  [id number]   MEDICAL RECORD NO.:  [id number]          PATIENT TYPE:  AMB   LOCATION:  ENDO                         FACILITY:  [facility]   PHYSICIAN:  Bernette Redbird, M.D.   DATE OF BIRTH:  [birth year]   DATE OF PROCEDURE:  09/01/2004  DATE OF DISCHARGE:                                 OPERATIVE REPORT   PROCEDURE PERFORMED:  Upper endoscopy.   ENDOSCOPIST:  Florencia Reasons, M.D.   INDICATIONS FOR PROCEDURE:  Noncardiac chest pain in a 70 year old female.   FINDINGS:  Unremarkable exam.   DESCRIPTION OF PROCEDURE:  The patient provided written consent for the  procedure.  Sedation was fentanyl 100 mcg and Versed 10 mg IV without  arrhythmias or desaturation.  The patient was deeply sedated when not  stimulated but was combative with passage of the scope.  With assistance  restraining her arms, however, we were able to do this brief exam without  significant difficulty.  The Olympus small caliber adult video endoscope was  passed under direct vision.  The larynx showed thickening of the posterior  commissure of the larynx but was otherwise unremarkable apart from maybe  some additional edema of the arytenoid cartilages.  The esophagus was  entered without undue difficulty and had normal mucosa, specifically without  evidence of reflux esophagitis, free reflux, Barrett's esophagus, varices,  infection, neoplasia or any ring, stricture or significant  hiatal hernia.   The stomach contained no significant residual and had normal mucosa, without  evidence of gastritis, erosions, ulcers, polyps or masses including a  retroflex view of the proximal stomach.  The pylorus, duodenal bulb and  second duodenum were briefly seen and appeared normal.   The scope was then removed from the patient.  No biopsies were obtained.  The patient tolerated the procedure well and there were no apparent  complications.   IMPRESSION:  1.  Chest pain without  obvious source on this examination (16109).  2.  Possible reflux laryngitis.  3.  Combative during procedure, might be a good candidate for Phenergan or      the use of Diprivan with future procedures.       RB/MEDQ  D:  09/01/2004  T:  09/01/2004  Job:  [id number]   cc:   Hillery Aldo, M.D.  Int. Med. - Resident - [address]  [place], [place] [address]  Fax: [phone]

## 2011-03-17 NOTE — Procedures (Signed)
NAME:  [name], [name]:  [id number]   MEDICAL RECORD NO.:  [id number]          PATIENT TYPE:  OUT   LOCATION:  SLEEP CENTER                 FACILITY:  [facility]   PHYSICIAN:  Clinton D. Maple Hudson, M.D. DATE OF BIRTH:  [birth year]   DATE OF STUDY:  08/04/2005                              NOCTURNAL POLYSOMNOGRAM   REFERRING PHYSICIAN:  Dr. Margarito Liner   DATE OF STUDY:  August 04, 2005   INDICATION FOR STUDY:  Hypersomnia with sleep apnea. Epworth sleepiness  score 13/24, BMI 32, weight 190 pounds. A CPAP therapeutic study had been  done on July 27, 2005 to a control pressure of 16 CWP, AHI 3.1 per  hour.   SLEEP ARCHITECTURE:  Total sleep time 320 minutes with sleep efficiency 78%.  Stage I was 20%, stage II 71%, stages III and IV 9%, REM 0. Sleep latency 42  minutes, awake after sleep onset 62 minutes, arousal index 13. No bedtime  medication was taken. She took two Advil for hip pain at 3 a.m.   RESPIRATORY DATA:  NPSG protocol. Apnea hypopnea index (AHI, RDI) 21.4  obstructive events per hour indicating moderate obstructive sleep  apnea/hypopnea syndrome. There were 100 obstructive apneas and 14 hypopneas.  Most sleep and therefore most events were while supine.   OXYGEN DATA:  Very loud snoring and mouth breathing. Oxygen desaturation to  a nadir of 89%. Mean oxygen saturation through the study was 96% on room  air.   CARDIAC DATA:  Normal sinus rhythm.   MOVEMENT/PARASOMNIA:  Complaints of hip pain. A total of 64 limb jerks were  recorded of which 10 were associated with arousal or awakening for a  periodic limb movement with arousal index of 1.9 per hour which is probably  insignificant.   IMPRESSION/RECOMMENDATION:  1.  Moderate obstructive sleep apnea/hypopnea syndrome, AHI 21.4 per hour      with loud snoring and oxygen desaturation to 89%.  2.  A previous continuous positive airway pressure therapeutic study was      done July 27, 2005 to  a      recommended pressure of 16 CWP, AHI 3.1 per hour.  3.  Note sleep position and probably sleep quality were limited by      complaints of hip pain.      Clinton D. Maple Hudson, M.D.  Diplomate, Biomedical engineer of Sleep Medicine  Electronically Signed     CDY/MEDQ  D:  08/13/2005 09:00:18  T:  08/13/2005 15:39:49  Job:  [id number]

## 2011-03-17 NOTE — Discharge Summary (Signed)
[place]. [facility]  Patient:    [name], [name]                     MRN: [id number] Adm. Date:  [id number] Disc. Date: [id number] Attending:  Farley Ly Dictator:   Jennette Kettle, M.D. CC:         Hillery Aldo, M.D.   Discharge Summary  DISCHARGE DIAGNOSES:  1. Right facial numbness, current hospitalization, questionable transient     ischemic attack.  2. Chronic left ear pain/hearing loss.  3. Hypertension.  4. History of tobacco use x 40 years, quit in March of 2001.  5. Gastroesophageal reflux disease/hiatal hernia.  6. Hypercholesterolemia.  7. History of sinusitis/bronchitis.  8. History of low back pain, status post fall in 1991.  9. History of diverticulitis. 10. History of benign work-up for a polyp in 1996. 11. Status post hysterectomy in 1972. 12. Status post cholecystectomy in 1991. 13. Chronic temporomandibular joint problems.  DISCHARGE MEDICATIONS:  1. Plavix 75 mg one tablet once a day (this was added during this hospital     course).  2. Vasotec 40 mg one tablet by mouth once a day.  3. Multivitamins one pill once a day.  4. Celexa 20 mg one pill once a day.  5. Hydrochlorothiazide one pill once a day.  6. Lipitor 20 mg one pill once a day.  7. Calcium supplement 500 mg once a day.  8. Premarin 0.625 mg once a day.  9. Vioxx 25 mg once a day. 10. Klor-Con 10 mEq two pills once a day. 11. Colace 100 mg once a day. 12. Baby aspirin 81 mg once a day. 13. Flonase/Nasonex as needed.  FOLLOW-UP:  Ms. Hazan has a follow-up appointment with Hillery Aldo, M.D., on June 06, 2001, in the internal medicine outpatient clinic at 4 p.m. The patient requested a female physician to follow her since [name], M.D., left in June of 2002.  At this follow-up, a neurological assessment should be made and possible neurology follow-up if the patients facial numbness and blurred vision persist.  PROCEDURES AND DIAGNOSTIC  STUDIES: 1. On May 13, 2001, a head CT without contrast was performed.  Impression:    No pathologic findings. 2. On May 25, 2001, MRI with and without contrast was performed.  The MRI    impressions were mild atrophy with mild nonspecific white matter changes    which may be consistent with small vessel disease, demyelination process,    inflammatory process, or vasculitis.  The MRA revealed mild intracranial    arteriosclerotic-type changes. 3. Carotid Dopplers/vertebral and subclavian duplex Dopplers:  Carotid    Dopplers revealed no internal carotid artery stenosis bilaterally.    Ventricular vertebral duplex Dopplers revealed flow is antegrade    bilaterally with no stenosis.  Duplex Dopplers of the subclavian artery    revealed no obvious evidence for subclavian steel.  There was a note on the    evaluation that there were mild nonspecific calcific plaques throughout the    internal carotids.  CONSULTANTS:  None.  HISTORY OF PRESENT ILLNESS:  The patient presented on May 13, 2001.  The patient is a 70 year old African-American female with a history of hypertension and hypercholesterolemia, who had sudden onset of right facial numbness associated with mild blurred vision in her right eye, diaphoresis, and mild confusion.  The episode last 5-10 minutes and recurred approximately 10-15 times over the previous 24 hours prior to admission.  The  patient denies slurred speech, dysphagia, any changes in hearing, no chest pain, and no shortness of breath.  The patient was initially evaluated in the outpatient clinic.  The patient was subsequently admitted for rule out of cardiovascular accident.  ADMISSION LABORATORY DATA:  On May 13, 2001, a comprehensive metabolic panel revealed sodium 140, potassium 3.8, chloride 106, bicarbonate 27, glucose 127, BUN 13, and creatinine 1.0 with liver enzymes within normal limits. Calcium 9.1.  The CBC revealed a white blood cell count of  5.9, hemoglobin 10.7, and platelets 183.  There was no left shift.  The urinalysis was negative for nitrites, leukocytes, protein, blood, ketones, and bilirubin. The patients initial coagulation studies, PT, PTT, and INR were all within normal limits.  The patients sedimentation rate was within normal limits at 15.  The patients TSH was within normal limits at 1.173.  Fecal occult blood was negative x 2.  The lipid profile revealed a cholesterol of 198, triglycerides 192, LDL 113, and HDL 47.  HOSPITAL COURSE: #1 - RIGHT-SIDED NUMBNESS AND FACIAL WEAKNESS:  The patient was admitted for rule out of cerebrovascular accident.  The patient was initially heparinized and the patient received a noncontrast head CT which showed no acute abnormalities and no hemorrhage.  The patient subsequently had a MRI and MRA which showed no evidence of an acute ischemic or hemorrhage event.  The patient did have some focal findings consistent with white matter changes. Underlying etiology for these white matter changes include possible small vessel disease, demyelination processes, inflammatory changes, and vasculitis. The patient had a normal sedimentation rate which downplays the likelihood of vasculitis.  The patient is an African-American, 70 year old lady which downplays the likelihood of demyelination changes secondary to MS.  The patient does have a history of hypertension and the possible etiology for these white matter changes is small vessel disease.  During her hospital course, the patient did not have any recurrent blurred vision or right facial numbness.  The patient is to follow up with Hillery Aldo, M.D., in the outpatient clinic on June 06, 2001.  At this time, the patient should have a neurological evaluation with possible neurology consult.  The patient was placed on Plavix 75 mg one tablet p.o. q.d.  The patient was given a 20-pill sample by the pharmacy to take home, as well as a  prescription with two refills.  #2 - BLOOD PRESSURE MONITORING:  The patient was noted to have a right arm  blood pressure which was less than in her left arm by a systolic pressure greater than 20 and a diastolic pressure greater than 10.  A work-up for subclavian steel was negative on duplex Dopplers.  Also, the patient had negative carotid Dopplers and vertebral Doppler studies.  DISCHARGE LABORATORY DATA:  The patient had a CBC on May 16, 2001, which revealed a white cell count of 7.4, hemoglobin 10.7, and platelets 207.  FOLLOW-UP:  The patient is to follow up with Hillery Aldo, M.D., in the internal medicine outpatient clinic on June 06, 2001, at 4 p.m.  The patient requested a female physician given that she had a good experience with her previous female physician, [name], M.D., who has now moved on. DD:  05/16/01 TD:  05/19/01 Job: [id number]

## 2011-03-17 NOTE — Op Note (Signed)
[facility]  Patient:    [name]                      MRN: [id number] Proc. Date: 11/25/99 Adm. Date:  [id number] Attending:  Geradine Girt A                           Operative Report  PREOPERATIVE DIAGNOSIS:  Hammertoe, fifth digit, left foot.  POSTOPERATIVE DIAGNOSIS:  Hammertoe, fifth digit, left foot.  PROCEDURE PERFORMED:  Hammertoe repair, fifth digit, left foot.  ANESTHESIA:  IV sedation with local, 3 cc of 0.5% Marcaine plus 3 cc of 2% Xylocaine plain.  HEMOSTASIS:  Fine ankle tourniquet.  ESTIMATED BLOOD LOSS:  Minimal blood loss.  DISPOSITION:  The patient was transported to the recovery room.  DESCRIPTION OF PROCEDURE:  The patient was transported to the OR and placed on he table in the supine position.  An ankle tourniquet was placed superior to the malleoli with sufficient padding to prevent contusion.  Upon achievement of sedation, the local anesthesia was administered, and the left foot was prepped nd draped in the usual aseptic manner.  The left foot exsanguinated with an Esmarch bandage.  An ankle tourniquet was inflated to 250 mmHg.  The following procedure was performed:  Hammertoe repair, fifth digit, left foot.  Attention was drawn o the dorsolateral aspect of the fifth digit, left foot.  Two semi-elliptical, converging incisions were planned with the skin marker.  These were performed in an oblique fashion from distal medial to proximal lateral and encompassed the hyperkeratotic lesion centrally.  This was approximately 6 mm in width and 3 cm in length.  The incisions were executed with a #15 blade, and the ensuing skin island and hyperkeratotic lesion were excised in total.  Further sharp and blunt tissue dissection were performed overlying the PIPJ of the fifth digit.  This was then  incised in a transverse fashion overlying the PIPJ.  The collateral ligaments were incised, and all soft tissue  was dissected free from the head of the proximal phalanx.  The rough edges were rasped smooth.  Antibiotic flush was performed, nd the area was found to be free of bony spicules.  Further attention was drawn to the middle phalanx.  Sharp tissue dissection was performed over the dorsal, lateral, and plantar aspects.  A hemi-phalangectomy was then performed with the sagittal  saw, and the dorsolateral aspect of the middle phalanx was excised.  The rough edges were rasped smooth.  The surgical site was flushed with copious amounts of antibiotic solution.  The area was then inspected with the [facility] mobile x-ray unit, and there was found to be good reduction of the deformity.  Further antibiotic flush was performed, followed by further antibiotic flush, and the area was found to be free of bony spicules.  The skin edges were then reapproximated with simple interrupted and horizontal mattress sutures in an alternating fashion consisting of 5-0 nylon.  The digit was injected with 1 cc of dexamethasone phosphate 4 mg/mL. The incision was prepped with Betadine, followed by a Xeroform dressing and a dry, sterile dressing.  The ankle tourniquet was deflated, and capillary filling time was noted to return to instantaneous in all digits.  The patient tolerated the procedure and anesthesia well, transported to the recovery room, and all vital signs stable.  Dispense written postoperative instructions and prescription, and return office visit in 4-7  days.  Advise the patient to contact myself or the office if further questions or problems arise. DD:  11/25/99 TD:  11/27/99 Job: [id number]

## 2011-03-17 NOTE — Discharge Summary (Signed)
NAMEMarland Drake  YATZIRI, WAINWRIGHT NO.:  [id number]   MEDICAL RECORD NO.:  [id number]          PATIENT TYPE:  ORB   LOCATION:  4524                         FACILITY:  [facility]   PHYSICIAN:  Ranelle Oyster, M.D.DATE OF BIRTH:  [birth year]   DATE OF ADMISSION:  11/28/2004  DATE OF DISCHARGE:  12/06/2004                                 DISCHARGE SUMMARY   DISCHARGE DIAGNOSIS:  1.  Left total hip replacement.  2.  Postop anemia.  3.  Hypertension.  4.  Renal insufficiency, resolved.  5.  Gout.   HISTORY OF PRESENT ILLNESS:  [name] is a 70 year old female with a  history of hypertension and left hip OA with progressive pain who elected to  undergo left total hip replacement on January 27 by Dr. Ophelia Charter.  Postoperatively, she is weight-bearing as tolerated, with bilateral total  hip replacement precautions.  She did require 3 units packed red blood cells  for postop anemia.  She has had some issues with hypertension past surgery.  Therapy is initiated and the patient is slow to progress.  She is currently  mod to max assist for transfers, min assist ambulating 25 feet.  SACU was  consulted for progressive goals.   PAST MEDICAL HISTORY:  Significant for ovarian cancer, right total hip  replacement, gout, hysterectomy, cholecystectomy, CVA with short memory  deficit and occasional dizziness, bronchitis, and low back pain secondary to  injury.   ALLERGIES:  Aspirin.   SOCIAL HISTORY:  The patient lives in one level home with 2-4 steps to  enter, was independent prior to admission.  She has two sons who live with  her.  She does not use any tobacco or alcohol.   HOSPITAL COURSE:  [name] was admitted to [facility] on November 28, 2004, for SACU level therapies to consist of PT and OT daily.  Past  admission, the patient was maintained on Lovenox and Coumadin on therapeutic  basis.  She was continued on iron supplements throughout her stay.  Follow  up check of postop  anemia revealed H&H to be stable at 9.2 and 27.7.  Check  of electrolytes revealed renal insufficiency to have resolved with sodium  137, potassium 4.5, chloride 104, CO2 26, BUN 15, creatinine 1.1, glucose  102.  UCS done was negative.  The patient has had some problems with  indigestion requiring some antacids during her stay.  The patient's hip  incision was noted to be healing well without any signs or symptoms of  infection.  She has had problems mostly with muscle spasms that were  relieved with the use of Robaxin.  She was noted to have some drainage  initially from the left hip and was started on Keflex as prophylaxis.  She  was treated for five days, this was discontinued at discharge as drainage  has resolved and the patient has some complaints of mild itching.  Staples  remain in place and the patient is to follow up with Dr. Ophelia Charter for staple  removal on Friday.  Therapies have been ongoing during her stay in the SACU.  Currently, she is at modified independent level for transfers, modified  independent level for ambulating 125 feet with a rolling walker, supervision  for navigating stairs.  She is at modified independent level for self-care  including toileting.  Further follow up therapies to include Home Health PT,  OT, and RN for protime draws by [facility].  On December 06, 2004,  the patient is discharged to home.   DISCHARGE MEDICATIONS:  Lisinopril 40 mg daily, Senokot S 2 p.o. q.h.s.,  Plavix 75 mg daily, Premarin 0.625 mg daily, Celexa 40 mg daily, Lipitor 40  mg q.h.s., Elavil 50 mg daily, Cardizem CD 180 mg daily, Protonix 40 mg  daily, Allopurinol 300 mg daily, Coumadin 5 mg q.p.m., Robaxin 500 mg 1 1/2  p.o. q.i.d. p.r.n. spasms, Oxycodone IR 5-10 mg p.o. q.4-6h., Tylenol p.r.n.  pain, hydrochlorothiazide 25 mg daily, K-Dur 20 mEq daily.   DISCHARGE INSTRUCTIONS:  Activities:  Follow bilateral hip precautions, use  a walker.  Diet is regular.  Wound care:   Wash with soap and water, keep  clean and dry.  No alcohol, no smoking, no driving.  The patient is to  follow up with Dr. Ophelia Charter on February 10.  Follow up with Dr. Darnelle Catalan for  routine check.  Follow up with Dr. Riley Kill as needed.      PP/MEDQ  D:  12/06/2004  T:  12/06/2004  Job:  [id number]   cc:   Veverly Fells. Ophelia Charter, M.D.  [address] [place], [place] [address]  Fax: [phone]

## 2011-03-17 NOTE — Procedures (Signed)
NAME:  [name], [name]:  [id number]   MEDICAL RECORD NO.:  [id number]          PATIENT TYPE:  OUT   LOCATION:  SLEEP CENTER                 FACILITY:  [facility]   PHYSICIAN:  Clinton D. Maple Hudson, M.D. DATE OF BIRTH:  1941/09/06   DATE OF STUDY:  07/27/2005                              NOCTURNAL POLYSOMNOGRAM   REFERRING PHYSICIAN:  Dr. Artist Beach.   INDICATION FOR STUDY:  Hypersomnia with sleep apnea. Epworth sleepiness  score 13/24, BMI 37. Weight 220 pounds. C-PAP titration protocol requested.  We have no record to a prior diagnostic sleep study.   SLEEP ARCHITECTURE:  Total sleep time 315 minutes with sleep efficiency 84%.  Stage I 11%, stage II 84%, stages III and IV 4%, REM absent. Sleep latency  25 minutes, awake after sleep onset 36 minutes, arousal index increased at  48. Amitriptyline and tramadol were taken at 2200 p.m.   RESPIRATORY DATA:  C-PAP titration protocol as requested. C-PAP was titrated  to 16 CWP, AHI 3.1 per hour. A medium ResMed ultra mirage C-PAP mask was  used with heated humidifier.   OXYGEN DATA:  Snoring was prevented by therapeutic C-PAP and oxygen  saturation on C-PAP was held at 95% on room air.   CARDIAC DATA:  Sinus rhythm.   MOVEMENT/PARASOMNIA:  A total of 224 limb jerks were reported of which 90  were associated with arousal or awakening for periodic limb movement with  arousal index of 17.1 per hour which is markedly increased.   IMPRESSION/RECOMMENDATIONS:  1.  Successful C-PAP titration to 16 CWP, AHI 3.1 per hour. A medium ResMed      ultra mirage C-PAP mask was used with a heated humidifier.  2.  Periodic limb movement with arousal 17.1 per hour. She may be a      candidate for specific therapy such as clonazepam or Requip if      clinically appropriate.      Clinton D. Maple Hudson, M.D.  Diplomate, Biomedical engineer of Sleep Medicine  Electronically Signed     CDY/MEDQ  D:  07/30/2005 13:58:54  T:  07/30/2005  23:58:02  Job:  [id number]

## 2011-03-17 NOTE — Discharge Summary (Signed)
NAMEMarland Kitchen  [name], [name] Drake                        ACCOUNT NO.:  [id number]   MEDICAL RECORD NO.:  [id number]                   PATIENT TYPE:  IPS   LOCATION:  4149                                 FACILITY:  [facility]   PHYSICIAN:  [name] Drake, M.D.             DATE OF BIRTH:  [birth year]   DATE OF ADMISSION:  08/13/2003  DATE OF DISCHARGE:  08/21/2003                                 DISCHARGE SUMMARY   DISCHARGE DIAGNOSES:  1. Right total hip arthroplasty.  2. Status post urinary tract infection.  3. Anemia.  4. History of hypertension.  5. Deep vein thrombosis prophylaxis.  6. History of gout.  7. History of hypercholesterolemia.   HISTORY OF PRESENT ILLNESS:  The patient is a 70 year old white female  admitted on October 7 with end-stage OA right hip with no relief with  conservative care. The patient underwent right total hip arthroplasty on  October 7 by Dr. Ophelia Drake. Placed on Coumadin for DVT prophylaxis. PT report at  this time indicated patient is ambulating close supervision, and she is  partial weight bearing 50% on her right 40 feet with standard walker,  transfer supervision level. Drake course significant for anemia status  post transfusion, urinary tract infection, chest pain, elevated BUN. The  patient was transferred to [facility] rehab depression on Tuesday, August 13, 2003.   ALLERGIES:  ASPIRIN and SEPTRA.   PAST MEDICAL HISTORY:  1. TIA.  2. Hypertension.  3. Ruptured disk.  4. __________.  5. Gout.  6. Diverticulitis.  7. Depression.  8. Elevated cholesterol.  9. CVA.  10.      Dyslipidemia.   PAST SURGICAL HISTORY:  Significant for gallbladder, hysterectomy,  appendectomy.   PRIMARY [name] Drake:  [name] Drake.   MEDICATIONS PRIOR TO ADMISSION:  1. Allopurinol 300 mg daily.  2. Hydrochlorothiazide 25 mg daily.  3. Lotensin 40 mg daily.  4. Elavil 25 mg daily.  5. Premarin 0.25 mg daily.  6. Lipitor 20 mg daily.  7. Aspirin 81 mg daily.  8. Plavix 75 mg daily.  9. Vitamin C daily.   SOCIAL HISTORY:  The patient lives alone in [place], [place].  Independent prior to admission. Retired, lives in one level home with three  to four steps to entry. Family in the area, very little support.   REVIEW OF SYSTEMS:  Significant joint pain and joint swelling.   FAMILY HISTORY:  Noncontributory.   Drake COURSE:  [name] Drake was admitted to [facility] rehab  department on August 13, 2003 for comprehensive patient rehabilitation to  receive more than three hours of therapy daily. Overall, [name] Drake made  great progress during her eight day stay in rehab. She was discharged on a  modified independent level. She was able to tolerate therapies very well and  able to maintain a partial weight bearing status. The patient was placed on  iron for anemia. She  had an admission hemoglobin of 8.5, admission  hematocrit 24.7. The patient's was fairly controlled on Lotensin as well as  HCTZ. Plavix was placed on hold due to Coumadin for DVT prophylaxis. While  in rehab, she was complaining of occasional intermittent left jaw pain,  occasional indigestion, and nausea. On August 19, 2003, the patient was  started on Phenergan as needed. She also received Elavil 20 mg and  compresses to jaw as needed for jaw pain. Incision demonstrated no signs of  infection and was healing very well. No adjustment was necessary in blood  pressure medication. She also remained on Cipro 250 mg b.i.d. for urinary  tract infection. The patient tended to be anemic during her entire stay in  rehab. Discharge hemoglobin was 8.5 and hematocrit 24.7. The patient  received hydrocortisone cream ointment to right foot and left arm b.i.d.  secondary to pruritus. No other major issues occurred while patient was in  rehab.   Latest labs indicated latest hemoglobin of 8.8, hematocrit 25.5, white blood  cell count 8.9, platelet count 343. Latest INR  performed on August 21, 2003  was 1.9. Latest sodium 135, potassium 3.7, chloride 100, CO2 30, glucose  117, BUN 70, creatinine 1.0. AST 34, ALT 64, alkaline phosphatase 126. Urine  culture performed on August 13, 2003 demonstrated no growth for one day. PT  report at time of discharge indicated patient is able to ambulate 150 feet,  partial weight bearing right lower extremity, modified independent level,  able to transfer to stand modified independent level, bed mobility modified  independent level. Able to perform all ADLs supervision to modified  independent level. Overall patient made all goals and discharged home with  her family.   At the time of discharge, all vitals were stable. Surgical incision  demonstrate no signs of infection. Staples were removed on October 19 and  steri strips were applied.   DISCHARGE MEDICATIONS:  1. Allopurinol 300 mg one tablet daily.  2. Hydrochlorothiazide 25 mg daily.  3. Lotensin 40 mg daily.  4. Lipitor 20 mg at night.  5. Elavil 25 mg at night.  6. Plavix 75 mg daily.  7. Premarin 0.625 mg daily.  8. Celexa 20 mg daily.  9. Coumadin 5 one-half tablet daily in evening until January 17.  10.      Ferrous sulfate 325 mg twice daily.  11.      Pain management oxycodone.  12.      Resume calcium.   DISCHARGE INSTRUCTIONS:  She is to follow hip precautions with partial  weight bearing, using walker. No driving. No drinking. She will keep dry  dressings on hip. No alcohol. No smoking. No driving. She has home health PT  and OT to draw PT and INR on October 25. She is to followup with Dr. Ophelia Drake;  appointment is on Monday, October 25 at 10:45. Followup with Dr. Faith Drake as needed. Followup with primary care physician to monitor anemia.      [name] Drake, P.A.                       [name] Drake, M.D.    [name] Drake  D:  09/07/2003  T:  09/08/2003  Job:  [id number]   cc:   [name] Drake, M.D.  [name] Drake

## 2011-03-20 ENCOUNTER — Ambulatory Visit (INDEPENDENT_AMBULATORY_CARE_PROVIDER_SITE_OTHER): Payer: PRIVATE HEALTH INSURANCE | Admitting: Internal Medicine

## 2011-03-20 ENCOUNTER — Encounter: Payer: Self-pay | Admitting: Internal Medicine

## 2011-03-20 VITALS — BP 144/68 | HR 61 | Temp 97.5°F | Ht 64.5 in | Wt 227.5 lb

## 2011-03-20 DIAGNOSIS — F329 Major depressive disorder, single episode, unspecified: Secondary | ICD-10-CM

## 2011-03-20 MED ORDER — CITALOPRAM HYDROBROMIDE 20 MG PO TABS: 20.0000 mg | ORAL_TABLET | Freq: Every day | ORAL | Status: DC

## 2011-03-20 NOTE — Patient Instructions (Addendum)
1. Stop the amitriptyline  2. Change the Celexa to 20 mg before bedtime.    3. Follow up in on 04/14/11 in the afternoon to see how your doing.      4. Make an appointment with Dr. Ophelia Charter to follow up on your hip pain for possible injection.

## 2011-03-20 NOTE — Progress Notes (Signed)
  Subjective:    Patient ID: [name], female    DOB: October 01, 1941, 70 y.o.   MRN: [id number]  HPI  [name] is a 70 year old woman who presents today for follow up from her last visit with me concerning her depression and muscle aches.  She states that she started the Celexa 20 mg tablets 1/2 tablet for a week then up to the full 20 mg tablet in the morning and she has been taking the amitriptyline 25 mg tablets at bedtime.  She states that her mood has improved immensely so much so that her priest and friends at church have commented on how happy she seems.  She also states that she has been sleeping a lot lately since starting the medicine.  She describes several episodes where she has fallen asleep eating lunch.  As for her pain it has almost completely resolved.  The only pain she has is in her hips when she walks which is secondary to her bilateral hip replacements.  She follows with Dr. Ophelia Charter every 3-6 months for this and gets steroid injections into her hips.    Review of Systems    Constitutional: Denies fever, chills, diaphoresis, appetite change and fatigue.  HEENT: Denies photophobia, eye pain, redness, hearing loss, ear pain, congestion, sore throat, rhinorrhea, sneezing, mouth sores, trouble swallowing, neck pain, neck stiffness and tinnitus.   Respiratory: Denies SOB, DOE, cough, chest tightness,  and wheezing.   Cardiovascular: Denies chest pain, palpitations and leg swelling.  Gastrointestinal: Denies nausea, vomiting, abdominal pain, diarrhea, constipation, blood in stool and abdominal distention.  Genitourinary: Denies dysuria, urgency, frequency, hematuria, flank pain and difficulty urinating.  Musculoskeletal: Denies myalgias, back pain, joint swelling, arthralgias and gait problem.  Skin: Denies pallor, rash and wound.  Neurological: Denies dizziness, seizures, syncope, weakness, light-headedness, numbness and headaches.  Hematological: Denies adenopathy. Easy  bruising, personal or family bleeding history  Psychiatric/Behavioral: Positive for increased sleep. Denies suicidal ideation, mood changes, confusion, nervousness, and agitation   Objective:   Physical Exam    Constitutional: Vital signs reviewed.  Patient is an elderly, well-developed and well-nourished woman in no acute distress and cooperative with exam. Alert and oriented x3.  Head: Normocephalic and atraumatic Ear: TM normal bilaterally Mouth: no erythema or exudates, MMM Eyes: PERRL, EOMI, conjunctivae normal, No scleral icterus.  Neck: Supple, Trachea midline normal ROM, No JVD, mass, thyromegaly, or carotid bruit present.  Cardiovascular: RRR, S1 normal, S2 normal, no MRG, pulses symmetric and intact bilaterally Pulmonary/Chest: CTAB, no wheezes, rales, or rhonchi Abdominal: Soft. Non-tender, non-distended, bowel sounds are normal, no masses, organomegaly, or guarding present.  GU: no CVA tenderness Musculoskeletal: No joint deformities, erythema, or stiffness, ROM limited by pain.  Point tenderness over the bilateral greater tuberosity.   Hematology: no cervical, inginal, or axillary adenopathy.  Neurological: A&O x3, Strength is normal and symmetric bilaterally, cranial nerve II-XII are grossly intact, no focal motor deficit, sensory intact to light touch bilaterally.  Skin: Warm, dry and intact. No rash, cyanosis, or clubbing.  Psychiatric: Normal mood and full affect. speech is rambling and tangential but not above her baseline. Behavior is normal. Judgment and thought content normal. Cognition and memory are normal.    Assessment & Plan:

## 2011-03-24 NOTE — Assessment & Plan Note (Signed)
[name] is feeling much better in terms of her depression.  She is having some side effects from the Celexa and amitriptyline.  She initially had been taking the Celexa at night with the amitriptyline but moved it to the morning when she looked at the bottle and saw that it said during the day.  She was also only supposed to be on 1/2 tablet of the amitriptyline but has been taking a whole tablet.  We will have her stop the amitriptyline today and have her start taking the Celexa at bedtime and see her back to see how she is doing.  If she is unable to sleep on just the celexa we could consider adding back the amitriptyline at 12.5 mg qhs.

## 2011-03-29 ENCOUNTER — Telehealth: Payer: Self-pay | Admitting: *Deleted

## 2011-03-29 DIAGNOSIS — F329 Major depressive disorder, single episode, unspecified: Secondary | ICD-10-CM

## 2011-03-29 NOTE — Telephone Encounter (Signed)
Pt calls c/o that her meds were doing her right so she stopped the new ones? She c/o depression, states she won't commit suicide because she is catholic, states no bm since last Thursday, states she is weak and not getting around very well at all. States she is not eating well. Would like to speak w/ dr Tonny Branch. Please advise.

## 2011-03-30 MED ORDER — FLUOXETINE HCL 20 MG PO CAPS
20.0000 mg | ORAL_CAPSULE | Freq: Every day | ORAL | Status: DC
Start: 2011-03-30 — End: 2011-04-14

## 2011-03-30 NOTE — Telephone Encounter (Signed)
[name] has been continuing to have problems with sleeping more, constipation, and feeling bad so she stopped the Celexa on her own.  She then had a bit of diarrhea and is still having problems sleeping.  She doesn't remember other medication that she has been on before for depression.  She has taken Wellbutrin before for smoking and did tolerate that well.    Plan for today is to stop the Celexa and start Prozac 20 mg in the morning and see her back at her follow up in June 15th.  She will call us if she has any problems with the medications or if she notices any warning signs such as suicidal ideation, or increasing depression.

## 2011-03-31 ENCOUNTER — Telehealth: Payer: Self-pay | Admitting: *Deleted

## 2011-03-31 NOTE — Telephone Encounter (Signed)
[facility] calls and states there is a very high interaction between PROZAC and PLAVIX, you may call them at [phone] and ask for a pharmacist.

## 2011-03-31 NOTE — Telephone Encounter (Signed)
Called and spoke with the pharmacist.  The interaction theoretically could cause a decrease in the activity of her Plavix.  I asked about Zoloft and he stated that they are both flagged the same way so we will likely have to just try the Prozac and monitor her for problems.

## 2011-03-31 NOTE — Telephone Encounter (Signed)
[facility] calls and states that there is a high probability of interaction between plavix and prozac. You may call and speak w/ pharmacist at [phone]

## 2011-04-03 ENCOUNTER — Telehealth: Payer: Self-pay | Admitting: *Deleted

## 2011-04-03 NOTE — Telephone Encounter (Signed)
I will discuss this with her at her follow up on the 15th.

## 2011-04-03 NOTE — Telephone Encounter (Signed)
Pt requests Lasix 20mg  change to 1 to 2 tabs daily; Qty. #60. States sometimes her legs swell and "trouble passing her urine" and would like to take 2 tabs when this happens. Pharmacy - [facility].

## 2011-04-03 NOTE — Telephone Encounter (Signed)
I do not want to just increase patient's diuretic without knowing her. She has an appointment with her primary care MD, [name], on 04/14/11 and can discuss it with him then.

## 2011-04-04 NOTE — Telephone Encounter (Signed)
Pt was called/message left - [name] will discuss Lasix at next OV 04/14/11.

## 2011-04-10 ENCOUNTER — Telehealth: Payer: Self-pay | Admitting: *Deleted

## 2011-04-10 NOTE — Telephone Encounter (Signed)
Pt called with c/o onset of diarrhea on Friday, continues of Saturday and Sunday, states BM was green in color. Pt was taken off of celexa also started on prozac on 6/5  ( she only took prozac tues and Wed.and saturday) She has taken Pepto bismol but not sure if prozac is causing the diarrhea. Today no BM/diarrhea Question is should she stop the prozac until appointment on Friday or continue and see is diarrhea returns.  Pt has appointment on Friday Pt # [phone]

## 2011-04-10 NOTE — Telephone Encounter (Signed)
She can do this either way.  If she is willing, she may restart prozac and see what happens.  Alternatively, she can wait until Friday.  Anti-depressants have little effect in 4 days.

## 2011-04-10 NOTE — Telephone Encounter (Signed)
Pt called and will restart prozac and see what happens.

## 2011-04-13 ENCOUNTER — Telehealth: Payer: Self-pay | Admitting: *Deleted

## 2011-04-13 DIAGNOSIS — Z1231 Encounter for screening mammogram for malignant neoplasm of breast: Secondary | ICD-10-CM

## 2011-04-13 NOTE — Telephone Encounter (Signed)
Referral is done.  Will forward to Debbie to complete.

## 2011-04-13 NOTE — Telephone Encounter (Signed)
Pt needs referral done for yearly mammogram, likes mid afternoon, [address] will be fine, has been to [facility]. Please have your nurse complete

## 2011-04-14 ENCOUNTER — Ambulatory Visit (INDEPENDENT_AMBULATORY_CARE_PROVIDER_SITE_OTHER): Payer: PRIVATE HEALTH INSURANCE | Admitting: Internal Medicine

## 2011-04-14 ENCOUNTER — Encounter: Payer: Self-pay | Admitting: Internal Medicine

## 2011-04-14 VITALS — BP 127/65 | HR 60 | Temp 97.6°F | Ht 64.5 in | Wt 225.0 lb

## 2011-04-14 DIAGNOSIS — I10 Essential (primary) hypertension: Secondary | ICD-10-CM

## 2011-04-14 DIAGNOSIS — F329 Major depressive disorder, single episode, unspecified: Secondary | ICD-10-CM

## 2011-04-14 MED ORDER — FUROSEMIDE 20 MG PO TABS: 20.0000 mg | ORAL_TABLET | Freq: Every day | ORAL | Status: DC

## 2011-04-14 MED ORDER — FLUOXETINE HCL 40 MG PO CAPS: 40.0000 mg | ORAL_CAPSULE | Freq: Every day | ORAL | Status: DC

## 2011-04-14 NOTE — Progress Notes (Signed)
Subjective:    Patient ID: [name], female    DOB: [birth year], 70 y.o.   MRN: [id number]  HPI  Ms. Gauger is a 70 year old woman who presents to clinic today for follow up from her last visit.  She was recently started on Celexa and amitriptyline but had problems with side effects from both medications so we switched her to Prozac 2 weeks ago.  She states that she is doing better.  Her mood has been better but she still has problems with being able to motivate herself to get out of bed to do things.  She states that if she has things to do she is able to do it but when she doesn't have anything planned she has a very hard time getting motivated.  She has had sleep problems for sometime since she worked 3rd shift for over 40 years.  She states that she falls asleep at night about 7 pm and then wakes up at midnight to watch the midnight mass on TV then falls asleep after that until 5 am.  She denies any SI/HI.  She is sad also because her children haven't visited or called lately as well as her pastor has been unable to visit lately.    She also states that 1-2 times per week she has problems with extra swelling in her feet.  She states that if she takes an extra Lasix that it gets better.    She also recently lost her dentures.  She has been wearing an old pair that do not fit her well. She doesn't not have dental insurance.  Patient Active Problem List  Diagnoses  . HYPOTHYROIDISM NOS  . HYPERLIPIDEMIA  . GOUT  . OBESITY  . ANEMIA OF CHRONIC DISEASE  . DEPRESSION  . UNSPECIFIED VISUAL LOSS  . HYPERTENSION  . UNSPECIFIED DIASTOLIC HEART FAILURE  . HEMORRHOIDS  . VENOUS INSUFFICIENCY  . ALLERGIC RHINITIS  . GERD  . HIATAL HERNIA  . MELENA  . CELLULITIS, METHICILLIN RESISTANT STAPHYLOCCOCUS AREUS  . ECZEMA  . INTERTRIGO, CANDIDAL  . OSTEOARTHRITIS  . HIP PAIN, BILATERAL, CHRONIC  . DEGENERATIVE DISC DISEASE  . LOW BACK PAIN  . LEG EDEMA, BILATERAL  . DYSPNEA ON EXERTION    . SYMPTOM, INCONTINENCE, URGE  . CERVICAL CANCER, HX OF  . Postmenopausal atrophic vaginitis  . Hx-TIA (transient ischemic attack)  . Mitral valve prolapse  . History of ovarian cancer   Medication Sig  . allopurinol (ZYLOPRIM) 300 MG tablet Take 1 tablet (300 mg total) by mouth daily.  [name] amLODipine (NORVASC) 5 MG tablet Take 5 mg by mouth daily.    [name] Apple Cider Vinegar 300 MG TABS Take 1 tablet by mouth daily.    . Ascorbic Acid (VITAMIN C) 500 MG tablet Take 500 mg by mouth daily.    [name] atorvastatin (LIPITOR) 80 MG tablet Take 80 mg by mouth daily.    . benzocaine-Menthol (DERMOPLAST) 20-0.5 % AERO Apply 1 application topically 3 (three) times daily as needed. Apply to back as needed for itching and skin irritation   . Biotin (BIOTIN 5000) 5 MG CAPS Take 1 capsule by mouth daily.    . Calcium Carbonate-Vitamin D (CALCIUM-VITAMIN D) 500-200 MG-UNIT per tablet Take 1 tablet by mouth daily.    . cetirizine (ZYRTEC) 10 MG tablet Take 1 tablet (10 mg total) by mouth daily.  . clopidogrel (PLAVIX) 75 MG tablet Take 75 mg by mouth daily.    . clotrimazole (GYNE-LOTRIMIN)  1 % vaginal cream Place 1 Applicatorful vaginally 2 (two) times daily.  [name] conjugated estrogens (PREMARIN) vaginal cream Place 0.5 Applicatorfuls vaginally daily.  [employer] Bandages & Supports (T.E.D. KNEE LENGTH/L-REGULAR) MISC 1 Units by Does not apply route daily.    . fluocinonide (LIDEX) 0.05 % cream Apply 1 application topically 2 (two) times daily.  [name] lisinopril (PRINIVIL,ZESTRIL) 40 MG tablet Take 40 mg by mouth daily.    [name] nystatin (NYSTOP) 100000 UNIT/GM POWD Apply 1 g (100,000 Units total) topically 2 (two) times daily. Apply to affected area twice daily  . oxybutynin (DITROPAN-XL) 5 MG 24 hr tablet Take 1 tablet (5 mg total) by mouth at bedtime.  . pantoprazole (PROTONIX) 40 MG tablet Take 1 tablet (40 mg total) by mouth daily.  . piroxicam (FELDENE) 20 MG capsule Take 20 mg by mouth daily.    . polyethylene  glycol (MIRALAX / GLYCOLAX) packet Take 17 g by mouth daily as needed.    . potassium chloride (KLOR-CON) 10 MEQ CR tablet Take 10 mEq by mouth daily.    . traMADol (ULTRAM) 50 MG tablet Take 50 mg by mouth every 8 (eight) hours as needed.    . triamcinolone (KENALOG) 0.1 % cream Apply topically 2 (two) times daily.   Review of Systems    ROS negative except for as noted in the HPI Objective:   Physical Exam    Constitutional: Vital signs reviewed.  Patient is a well-developed and well-nourished woman in no acute distress and cooperative with exam. Alert and oriented x3, She is tangential as usual. Head: Normocephalic and atraumatic Ear: TM normal bilaterally Mouth: no erythema or exudates, MMM, dentures are poor fitting but no sores noted in the oral mucosa.  Eyes: PERRL, EOMI, conjunctivae normal, No scleral icterus.  Neck: Supple, Trachea midline normal ROM, No JVD, mass, thyromegaly, or carotid bruit present.  Cardiovascular: RRR, S1 normal, S2 normal, no MRG, pulses symmetric and intact bilaterally Pulmonary/Chest: CTAB, no wheezes, rales, or rhonchi Abdominal: Soft. Non-tender, non-distended, bowel sounds are normal, no masses, organomegaly, or guarding present.  GU: no CVA tenderness Musculoskeletal: No joint deformities, erythema, or stiffness, ROM full and no nontender Hematology: no cervical, inginal, or axillary adenopathy.  Neurological: A&O x3, Strenght is normal and symmetric bilaterally, cranial nerve II-XII are grossly intact, no focal motor deficit, sensory intact to light touch bilaterally.  Skin: Warm, dry and intact. No rash, cyanosis, or clubbing.  Psychiatric: Full mood and affect. speech and behavior is normal. Judgment and thought content normal. Cognition and memory are normal.   Assessment & Plan:

## 2011-04-14 NOTE — Patient Instructions (Addendum)
Insomnia Insomnia is frequent trouble falling and/or staying asleep. Insomnia can be a long term problem or a short term problem. Both are common. Insomnia can be a short term problem when the wakefulness is related to a certain stress or worry. Long term insomnia is often related to ongoing stress during waking hours and/or poor sleeping habits. Overtime, sleep deprivation itself can make the problem worse. Every little thing feels more severe because you are overtired and your ability to cope is decreased. SYMPTOMS  Not feeling rested in the morning.   Anxiety and restlessness at bedtime.   Difficulty falling and staying asleep.  CAUSES  Stress, anxiety, and depression.   Poor sleeping habits.   Distractions such as TV in the bedroom.   Naps close to bedtime.   Engaging in emotionally charged conversations before bed.   Technical reading before sleep.   Alcohol and other sedatives. They may make the problem worse. They can hurt normal sleep patterns and normal dream activity.   Stimulants such as caffeine for several hours prior to bedtime.   Pain syndromes and shortness of breath can cause insomnia.   Exercise late at night.   Changing time zones may cause sleeping problems (jet lag).  It is sometimes helpful to have someone observe your sleeping patterns. They should look for periods of not breathing during the night (sleep apnea). They should also look to see how long those periods last. If you live alone or observers are uncertain, you can also be observed at a sleep clinic where your sleep patterns will be professionally monitored. Sleep apnea requires a checkup and treatment. Give your caregivers your medical history. Give your caregivers observations your family has made about your sleep.  TREATMENT  Your caregiver may prescribe treatment for an underlying medical disorders. Your caregiver can give advice or help if you are using alcohol or other drugs for self-medication.  Treatment of underlying problems will usually eliminate insomnia problems.   Medications can be prescribed for short time use. They are generally not recommended for lengthy use.   Over-the-counter sleep medicines are not recommended for lengthy use. They can be habit forming.   You can promote easier sleeping by making lifestyle changes such as:   Using relaxation techniques that help with breathing and reduce muscle tension.   Exercising earlier in the day.   Changing your diet and the time of your last meal. No night time snacks.   Establish a regular time to go to bed.   Counseling can help with stressful problems and worry.   Soothing music and white noise may be helpful if there are background noises you cannot remove.   Stop tedious detailed work at least one hour before bedtime.  HOME CARE INSTRUCTIONS  Keep a diary. Inform your caregiver about your progress. This includes any medication side effects. See your caregiver regularly. Take note of:   Times when you are asleep.  Times when you are awake during the night.   The quality of your sleep.  How you feel the next day.   This information will help your caregiver care for you.  Get out of bed if you are still awake after 15 minutes. Read or do some quiet activity. Keep the lights down. Wait until you feel sleepy and go back to bed.   Keep regular sleeping and waking hours. Avoid naps.   Exercise regularly.   Avoid distractions at bedtime. Distractions include watching television or engaging in any intense or detailed  activity like attempting to balance the household checkbook.   Develop a bedtime ritual. Keep a familiar routine of bathing, brushing your teeth, climbing into bed at the same time each night, listening to soothing music. Routines increase the success of falling to sleep faster.   Use relaxation techniques. This can be using breathing and muscle tension release routines. It can also include  visualizing peaceful scenes. You can also help control troubling or intruding thoughts by keeping your mind occupied with boring or repetitive thoughts like the old concept of counting sheep. You can make it more creative like imagining planting one beautiful flower after another in your backyard garden.   During your day, work to eliminate stress. When this is not possible use some of the previous suggestions to help reduce the anxiety that accompanies stressful situations.  MAKE SURE YOU:   Understand these instructions.   Will watch your condition.   Will get help right away if you are not doing well or get worse.  Document Released: 10/13/2000 Document Re-Released: 09/28/2008 ExitCare Patient Information 2011 [place], [place].  1. Increase your Prozac to 40 mg daily.  You can take 2 of the 20 mg tablets until you're out then use the new strength.  2. It is okay to take one extra Lasix (Furosemide) as needed for increased swelling.  3. Follow up in about 4 weeks to see how your mood is doing.

## 2011-04-16 NOTE — Assessment & Plan Note (Signed)
She is still having significant issues with her depression with decreased motivation etc.  We will increase her Prozac to 40 mg daily and see her back in 4-6 weeks.  She was counseled that full effect of the medication can take 6-8 weeks and that she should let me know if she does not continue to improve.  I think a significant portion of her mood problems have to also do with her isolation even with intervention from her [employer].  I think she would be an excellent candidate for the [facility] program to try to get her more community activity.  I will refer her to Dorothe Pea to have her help her get connected with the program.

## 2011-04-16 NOTE — Assessment & Plan Note (Signed)
Blood pressure is better today then her last visit.  We will continue to monitor and adjust her medications as needed.

## 2011-04-17 ENCOUNTER — Telehealth: Payer: Self-pay | Admitting: *Deleted

## 2011-04-17 NOTE — Telephone Encounter (Signed)
Pt calls crying, screaming, upset about what to do w/ letter from dr Tonny Branch for jury duty, i calmed her down and called [place] [employer], spoke w/ mrs. Pearson, faxed letter to her office and pt should hear within 2 days that she is excused from duty, pt will call if she does not get notice by wed

## 2011-04-19 ENCOUNTER — Telehealth: Payer: Self-pay | Admitting: Licensed Clinical Social Worker

## 2011-04-19 NOTE — Telephone Encounter (Signed)
Called [facility] and made referral on behalf of [name].  SW to follow on outcome of assessment.

## 2011-04-25 ENCOUNTER — Ambulatory Visit
Admission: RE | Admit: 2011-04-25 | Discharge: 2011-04-25 | Disposition: A | Discharge status: Home / Self Care | Payer: Medicare Other | Source: Ambulatory Visit | Attending: Family Medicine | Admitting: Family Medicine

## 2011-04-25 DIAGNOSIS — Z1231 Encounter for screening mammogram for malignant neoplasm of breast: Secondary | ICD-10-CM

## 2011-04-26 ENCOUNTER — Telehealth: Payer: Self-pay | Admitting: Licensed Clinical Social Worker

## 2011-04-26 ENCOUNTER — Telehealth: Payer: Self-pay | Admitting: *Deleted

## 2011-04-26 NOTE — Telephone Encounter (Signed)
I don't think her sleepiness or decreased energy is from the Prozac.  It is likely related to her depression.  I had sent a referral through for her to meet with [name] for help getting set up with the [facility] program.  Her social isolation is one of the big causes of her depression.  Please call her and reassure her that this should improve over time with the Prozac and that it is unlikely that the Prozac is causing her fatigue.  Also if she hasn't made an appointment with [name]. Please get that set up for her ASAP.  I've actually talked to Dr. Modena Jansky who is the doctor for the [facility] program and he said she sounded like a good candidate.

## 2011-04-26 NOTE — Telephone Encounter (Signed)
Pt called stating her meds were changed on 6/15, prozac 20 mg increased to 40 mg Since then she is  Sleeping all the time and when up she just sits. She has no strength. Sometimes it's hard to get her awake.  She doesn't even feel like eating.  Please advise. Pt # [id number]

## 2011-04-26 NOTE — Telephone Encounter (Signed)
Carollee Herter called and said that she scheduled a home visit with Rafaella for this Thursday,  June 28th for assessment for [facility] program.

## 2011-04-27 NOTE — Telephone Encounter (Signed)
Pt called and informed.  She states [facility] will be coming out today to talk with her.

## 2011-05-11 ENCOUNTER — Telehealth: Payer: Self-pay | Admitting: *Deleted

## 2011-05-11 NOTE — Telephone Encounter (Signed)
If she still thinks that this is causing her sleepiness then she can taper her way down by dropping to 20 mg for 3-4 days then stopping it.  Otherwise we will just have to wait for the [facility] program's response.

## 2011-05-11 NOTE — Telephone Encounter (Signed)
Another call from pt today asking if it's okay to stop taking prozac 40 mg daily.  She is just sleeping, not eating, she is not getting out of bed, she does not feel alert when she is up. She has appointment on 7/27. She did talk with the [facility] people but has not heard back from them. She may be a little over income.  She is concerned. Pt # [id number]

## 2011-05-12 NOTE — Telephone Encounter (Signed)
Pt called and prozac comes in capsules.  Not able to take 20 mg. She does not have any 20 mg caps left. Please advise

## 2011-05-12 NOTE — Telephone Encounter (Signed)
I can send in Rx for 20mg  ($4 med) or she can take 40 mg QOD for a few days then stop

## 2011-05-12 NOTE — Telephone Encounter (Signed)
Pt informed and would like to take the prozac 40 mg every other day for three doses then stop.  She will call if any problems

## 2011-05-26 ENCOUNTER — Encounter: Payer: Self-pay | Admitting: Internal Medicine

## 2011-05-26 ENCOUNTER — Ambulatory Visit (INDEPENDENT_AMBULATORY_CARE_PROVIDER_SITE_OTHER): Payer: Medicare Other | Admitting: Internal Medicine

## 2011-05-26 DIAGNOSIS — R5383 Other fatigue: Secondary | ICD-10-CM

## 2011-05-26 DIAGNOSIS — E039 Hypothyroidism, unspecified: Secondary | ICD-10-CM

## 2011-05-26 DIAGNOSIS — R5381 Other malaise: Secondary | ICD-10-CM

## 2011-05-26 DIAGNOSIS — F3289 Other specified depressive episodes: Secondary | ICD-10-CM

## 2011-05-26 DIAGNOSIS — F329 Major depressive disorder, single episode, unspecified: Secondary | ICD-10-CM

## 2011-05-26 LAB — CBC
HCT: 33.6 % — ABNORMAL LOW (ref 36.0–46.0)
Hemoglobin: 10.7 g/dL — ABNORMAL LOW (ref 12.0–15.0)
MCH: 30.1 pg (ref 26.0–34.0)
MCHC: 31.8 g/dL (ref 30.0–36.0)
MCV: 94.6 fL (ref 78.0–100.0)
Platelets: 251 10*3/uL (ref 150–400)
RBC: 3.55 MIL/uL — ABNORMAL LOW (ref 3.87–5.11)
RDW: 16.9 % — ABNORMAL HIGH (ref 11.5–15.5)
WBC: 7.4 10*3/uL (ref 4.0–10.5)

## 2011-05-26 LAB — TSH: TSH: 2.403 u[IU]/mL (ref 0.350–4.500)

## 2011-05-26 LAB — T4, FREE: Free T4: 1.04 ng/dL (ref 0.80–1.80)

## 2011-05-26 MED ORDER — FLUOXETINE HCL 20 MG PO CAPS: 20.0000 mg | ORAL_CAPSULE | Freq: Every day | ORAL | Status: DC

## 2011-05-26 NOTE — Patient Instructions (Signed)
Start Prozac 20 mg at bedtime.    If it continues to give you problems please call me.  Stop in the lab to have a quick set of testing done.  If there is a problem I will call you, otherwise we will talk about it at your follow up.  I will have our Social worker Dorothe Pea contact the [employer].  Follow up in 4-6 weeks to see how your mood is doing.

## 2011-05-26 NOTE — Progress Notes (Signed)
Subjective:   [name]   DOB: 1941/05/19 70 y.o.   MRN: [id number]  HPI: [name] is a 70 y.o. who presents today for follow up from her last appointment with me.  She has been struggling with depression and fatigue.  She has tapered off of her Prozac and is having less problem with hypersomnia but still has no energy or motivation to do routine care items around the house.  She is also noticing that she is worrying about more things as well.  She states that she doesn't have the energy to get up off the couch to clean or cook.  She hasn't been eating and is concerned that she is losing weight.  She denies any SOB, DOE, weakness in her legs, blood in her stool, weight gain, thinning of her hair, or dryness of her skin.    She also had her visit with the [facility] and is waiting to hear back from them if she qualifies for their services.  She is frustrated that it is taking them so long to get back to her.   We also had a discussion about preventative health issues.  She is up to date on her mammograms and vaccinations other then her Zostavax.  She did have a colonoscopy done by Dr. [name] in September of 2011 but we have no report in our records yet.    Past Medical History  Diagnosis Date  . History of cervical cancer 1972  . Ovarian cancer 1972    S/P oophorectomy  . GERD (gastroesophageal reflux disease)   . Gout   . Hypertension   . DJD (degenerative joint disease), multiple sites     Low back pain worst  . DDD (degenerative disc disease), lumbosacral   . Osteoarthritis of hip     bilateral hips  . Hx-TIA (transient ischemic attack) 05/13/2001    Right facial numbness  . Sensorineural hearing loss of both ears   . Mitral valve prolapse 10/27/1999    Subsequent 2D echo show normal mitral valve  . Chronic venous insufficiency   . Colon, diverticulosis Sept. 2011    outpatient colonoscopy by Dr. [name].  Need record  . Hyperlipidemia   .  Recurrent boils     History of MRSA skin infections with abscess  . Gout     Uric Acid level 4.2 on 300 allopurinol   Current Outpatient Prescriptions  Medication Sig Dispense Refill  . allopurinol (ZYLOPRIM) 300 MG tablet Take 1 tablet (300 mg total) by mouth daily.  30 tablet  6  . amLODipine (NORVASC) 5 MG tablet Take 5 mg by mouth daily.        [name] Apple Cider Vinegar 300 MG TABS Take 1 tablet by mouth daily.        . Ascorbic Acid (VITAMIN C) 500 MG tablet Take 500 mg by mouth daily.        [name] atorvastatin (LIPITOR) 80 MG tablet Take 80 mg by mouth daily.        . benzocaine-Menthol (DERMOPLAST) 20-0.5 % AERO Apply 1 application topically 3 (three) times daily as needed. Apply to back as needed for itching and skin irritation       . Biotin (BIOTIN 5000) 5 MG CAPS Take 1 capsule by mouth daily.        . Calcium Carbonate-Vitamin D (CALCIUM-VITAMIN D) 500-200 MG-UNIT per tablet Take 1 tablet by mouth daily.        . cetirizine (  ZYRTEC) 10 MG tablet Take 1 tablet (10 mg total) by mouth daily.  30 tablet  6  . clopidogrel (PLAVIX) 75 MG tablet Take 75 mg by mouth daily.        . clotrimazole (GYNE-LOTRIMIN) 1 % vaginal cream Place 1 Applicatorful vaginally 2 (two) times daily.  45 g  2  . conjugated estrogens (PREMARIN) vaginal cream Place 0.5 Applicatorfuls vaginally daily.  42.5 g  3  . Elastic Bandages & Supports (T.E.D. KNEE LENGTH/L-REGULAR) MISC 1 Units by Does not apply route daily.        . fluocinonide (LIDEX) 0.05 % cream Apply 1 application topically 2 (two) times daily.  60 g  3  . FLUoxetine (PROZAC) 40 MG capsule Take 1 capsule (40 mg total) by mouth daily.  30 capsule  2  . furosemide (LASIX) 20 MG tablet Take 1 tablet (20 mg total) by mouth daily. Okay to use one more tablet daily for increased swelling as needed.  45 tablet  6  . lisinopril (PRINIVIL,ZESTRIL) 40 MG tablet Take 40 mg by mouth daily.        [name] nystatin (NYSTOP) 100000 UNIT/GM POWD Apply 1 g (100,000 Units total)  topically 2 (two) times daily. Apply to affected area twice daily  30 g  2  . oxybutynin (DITROPAN-XL) 5 MG 24 hr tablet Take 1 tablet (5 mg total) by mouth at bedtime.  30 tablet  6  . pantoprazole (PROTONIX) 40 MG tablet Take 1 tablet (40 mg total) by mouth daily.  30 tablet  6  . piroxicam (FELDENE) 20 MG capsule Take 20 mg by mouth daily.        . polyethylene glycol (MIRALAX / GLYCOLAX) packet Take 17 g by mouth daily as needed.        . potassium chloride (KLOR-CON) 10 MEQ CR tablet Take 10 mEq by mouth daily.        . traMADol (ULTRAM) 50 MG tablet Take 50 mg by mouth every 8 (eight) hours as needed.        . triamcinolone (KENALOG) 0.1 % cream Apply topically 2 (two) times daily.  30 g  5   No family history on file. History   Social History  . Marital Status: Widowed    Spouse Name: N/A    Number of Children: N/A  . Years of Education: N/A   Social History Main Topics  . Smoking status: Former Smoker    Quit date: 10/30/1997  . Smokeless tobacco: Never Used  . Alcohol Use: No  . Drug Use: No  . Sexually Active: No   Other Topics Concern  . None   Social History Narrative  . None   Review of Systems: Constitutional: Positive for fatigue.  Denies fever, chills, diaphoresis, appetite change.  HEENT: Denies photophobia, eye pain, redness, hearing loss, ear pain, congestion, sore throat, rhinorrhea, sneezing, mouth sores, trouble swallowing, neck pain, neck stiffness and tinnitus.   Respiratory: Denies SOB, DOE, cough, chest tightness,  and wheezing.   Cardiovascular: Denies chest pain, palpitations and leg swelling.  Gastrointestinal: Denies nausea, vomiting, abdominal pain, diarrhea, constipation, blood in stool and abdominal distention.  Genitourinary: Denies dysuria, urgency, frequency, hematuria, flank pain and difficulty urinating.  Musculoskeletal: Denies myalgias, back pain, joint swelling, arthralgias and gait problem.  Skin: Denies pallor, rash and wound.    Neurological: Denies dizziness, seizures, syncope, weakness, light-headedness, numbness and headaches.  Hematological: Denies adenopathy. Easy bruising, personal or family bleeding history  Psychiatric/Behavioral: Positive for  nervousness, and lack of motivation.  Denies suicidal ideation, mood changes, confusion, sleep disturbance and agitation  Objective:  Physical Exam: Filed Vitals:   05/26/11 1348  BP: 139/66  Pulse: 55  Temp: 97.9 F (36.6 C)  TempSrc: Oral  Height: 5' 4.75" (1.645 m)  Weight: 223 lb 9.6 oz (101.424 kg)   Constitutional: Vital signs reviewed.  Patient is a well-developed, obese woman in no acute distress and cooperative with exam. Alert and oriented x3.  She is talkative and tangential as usual.  Head: Normocephalic and atraumatic Ear: TM normal bilaterally Mouth: no erythema or exudates, MMM Eyes: PERRL, EOMI, conjunctivae is mildly pale, No scleral icterus.  Neck: Supple, Trachea midline normal ROM, No JVD, mass, thyromegaly, or carotid bruit present.  Cardiovascular: RRR, S1 normal, S2 normal, no MRG, pulses symmetric and intact bilaterally Pulmonary/Chest: CTAB, no wheezes, rales, or rhonchi Abdominal: Soft. Non-tender, non-distended, bowel sounds are normal, no masses, organomegaly, or guarding present.  GU: no CVA tenderness Musculoskeletal: No joint deformities, erythema, or stiffness, ROM full and no nontender Hematology: no cervical, inginal, or axillary adenopathy.  Skin: Warm, dry and intact. No rash, cyanosis, or clubbing.  Psychiatric: Normal mood and affect. speech and behavior is normal. Judgment and thought content normal. Cognition and memory are normal.   Assessment & Plan:

## 2011-05-26 NOTE — Progress Notes (Deleted)
Subjective:   [name]   DOB: May 06, 1941 70 y.o.   MRN: [id number]  HPI: [name] is a 70 y.o.  Past Medical History  Diagnosis Date  . History of cervical cancer 1972  . Ovarian cancer 1972    S/P oophorectomy  . GERD (gastroesophageal reflux disease)   . Gout   . Hypertension   . DJD (degenerative joint disease), multiple sites     Low back pain worst  . DDD (degenerative disc disease), lumbosacral   . Osteoarthritis of hip     bilateral hips  . Hx-TIA (transient ischemic attack) 05/13/2001    Right facial numbness  . Sensorineural hearing loss of both ears   . Mitral valve prolapse 10/27/1999    Subsequent 2D echo show normal mitral valve  . Chronic venous insufficiency   . Colon, diverticulosis Sept. 2011    outpatient colonoscopy by Dr. [name].  Need record  . Hyperlipidemia   . Recurrent boils     History of MRSA skin infections with abscess  . Gout     Uric Acid level 4.2 on 300 allopurinol   Current Outpatient Prescriptions  Medication Sig Dispense Refill  . allopurinol (ZYLOPRIM) 300 MG tablet Take 1 tablet (300 mg total) by mouth daily.  30 tablet  6  . amLODipine (NORVASC) 5 MG tablet Take 5 mg by mouth daily.        [name] Apple Cider Vinegar 300 MG TABS Take 1 tablet by mouth daily.        . Ascorbic Acid (VITAMIN C) 500 MG tablet Take 500 mg by mouth daily.        [name] atorvastatin (LIPITOR) 80 MG tablet Take 80 mg by mouth daily.        . benzocaine-Menthol (DERMOPLAST) 20-0.5 % AERO Apply 1 application topically 3 (three) times daily as needed. Apply to back as needed for itching and skin irritation       . Biotin (BIOTIN 5000) 5 MG CAPS Take 1 capsule by mouth daily.        . Calcium Carbonate-Vitamin D (CALCIUM-VITAMIN D) 500-200 MG-UNIT per tablet Take 1 tablet by mouth daily.        . cetirizine (ZYRTEC) 10 MG tablet Take 1 tablet (10 mg total) by mouth daily.  30 tablet  6  . clopidogrel (PLAVIX) 75 MG tablet Take 75 mg  by mouth daily.        . clotrimazole (GYNE-LOTRIMIN) 1 % vaginal cream Place 1 Applicatorful vaginally 2 (two) times daily.  45 g  2  . conjugated estrogens (PREMARIN) vaginal cream Place 0.5 Applicatorfuls vaginally daily.  42.5 g  3  . Elastic Bandages & Supports (T.E.D. KNEE LENGTH/L-REGULAR) MISC 1 Units by Does not apply route daily.        . fluocinonide (LIDEX) 0.05 % cream Apply 1 application topically 2 (two) times daily.  60 g  3  . FLUoxetine (PROZAC) 40 MG capsule Take 1 capsule (40 mg total) by mouth daily.  30 capsule  2  . furosemide (LASIX) 20 MG tablet Take 1 tablet (20 mg total) by mouth daily. Okay to use one more tablet daily for increased swelling as needed.  45 tablet  6  . lisinopril (PRINIVIL,ZESTRIL) 40 MG tablet Take 40 mg by mouth daily.        [name] nystatin (NYSTOP) 100000 UNIT/GM POWD Apply 1 g (100,000 Units total) topically 2 (two) times daily. Apply to affected area twice  daily  30 g  2  . oxybutynin (DITROPAN-XL) 5 MG 24 hr tablet Take 1 tablet (5 mg total) by mouth at bedtime.  30 tablet  6  . pantoprazole (PROTONIX) 40 MG tablet Take 1 tablet (40 mg total) by mouth daily.  30 tablet  6  . piroxicam (FELDENE) 20 MG capsule Take 20 mg by mouth daily.        . polyethylene glycol (MIRALAX / GLYCOLAX) packet Take 17 g by mouth daily as needed.        . potassium chloride (KLOR-CON) 10 MEQ CR tablet Take 10 mEq by mouth daily.        . traMADol (ULTRAM) 50 MG tablet Take 50 mg by mouth every 8 (eight) hours as needed.        . triamcinolone (KENALOG) 0.1 % cream Apply topically 2 (two) times daily.  30 g  5   No family history on file. History   Social History  . Marital Status: Widowed    Spouse Name: N/A    Number of Children: N/A  . Years of Education: N/A   Social History Main Topics  . Smoking status: Former Smoker    Quit date: 10/30/1997  . Smokeless tobacco: Never Used  . Alcohol Use: No  . Drug Use: No  . Sexually Active: No   Other Topics  Concern  . Not on file   Social History Narrative  . No narrative on file   Review of Systems: Constitutional: Denies fever, chills, diaphoresis, appetite change and fatigue.  HEENT: Denies photophobia, eye pain, redness, hearing loss, ear pain, congestion, sore throat, rhinorrhea, sneezing, mouth sores, trouble swallowing, neck pain, neck stiffness and tinnitus.   Respiratory: Denies SOB, DOE, cough, chest tightness,  and wheezing.   Cardiovascular: Denies chest pain, palpitations and leg swelling.  Gastrointestinal: Denies nausea, vomiting, abdominal pain, diarrhea, constipation, blood in stool and abdominal distention.  Genitourinary: Denies dysuria, urgency, frequency, hematuria, flank pain and difficulty urinating.  Musculoskeletal: Denies myalgias, back pain, joint swelling, arthralgias and gait problem.  Skin: Denies pallor, rash and wound.  Neurological: Denies dizziness, seizures, syncope, weakness, light-headedness, numbness and headaches.  Hematological: Denies adenopathy. Easy bruising, personal or family bleeding history  Psychiatric/Behavioral: Denies suicidal ideation, mood changes, confusion, nervousness, sleep disturbance and agitation  Objective:  Physical Exam: There were no vitals filed for this visit. Constitutional: Vital signs reviewed.  Patient is a well-developed and well-nourished *** in no acute distress and cooperative with exam. Alert and oriented x3.  Head: Normocephalic and atraumatic Ear: TM normal bilaterally Mouth: no erythema or exudates, MMM Eyes: PERRL, EOMI, conjunctivae normal, No scleral icterus.  Neck: Supple, Trachea midline normal ROM, No JVD, mass, thyromegaly, or carotid bruit present.  Cardiovascular: RRR, S1 normal, S2 normal, no MRG, pulses symmetric and intact bilaterally Pulmonary/Chest: CTAB, no wheezes, rales, or rhonchi Abdominal: Soft. Non-tender, non-distended, bowel sounds are normal, no masses, organomegaly, or guarding present.    GU: no CVA tenderness Musculoskeletal: No joint deformities, erythema, or stiffness, ROM full and no nontender Hematology: no cervical, inginal, or axillary adenopathy.  Neurological: A&O x3, Strenght is normal and symmetric bilaterally, cranial nerve II-XII are grossly intact, no focal motor deficit, sensory intact to light touch bilaterally.  Skin: Warm, dry and intact. No rash, cyanosis, or clubbing.  Psychiatric: Normal mood and affect. speech and behavior is normal. Judgment and thought content normal. Cognition and memory are normal.    Assessment & Plan:

## 2011-05-27 LAB — BASIC METABOLIC PANEL WITH GFR
BUN: 19 mg/dL (ref 6–23)
CO2: 24 mEq/L (ref 19–32)
Calcium: 9.2 mg/dL (ref 8.4–10.5)
Chloride: 108 mEq/L (ref 96–112)
Creat: 1.27 mg/dL — ABNORMAL HIGH (ref 0.50–1.10)
GFR, Est African American: 50 mL/min — ABNORMAL LOW (ref >60)
GFR, Est Non African American: 42 mL/min — ABNORMAL LOW (ref >60)
Glucose, Bld: 106 mg/dL — ABNORMAL HIGH (ref 70–99)
Potassium: 3.9 mEq/L (ref 3.5–5.3)
Sodium: 142 mEq/L (ref 135–145)

## 2011-05-29 ENCOUNTER — Encounter: Payer: Self-pay | Admitting: Internal Medicine

## 2011-05-29 MED ORDER — FERROUS SULFATE 325 (65 FE) MG PO TABS: 325.0000 mg | ORAL_TABLET | Freq: Every day | ORAL | Status: DC

## 2011-05-29 NOTE — Assessment & Plan Note (Signed)
I still think that the majority of her problem is one of depression.  Her anemia could be playing a part in the lack of energy etc but there is also a question of dysmotivation as well as nervousness and social isolation.  We had a long talk about where to go from here and we decided to restart Prozac at 20 mg daily and follow.  She will call if she continues to have problems with fatigue we will likely need to try an agent like Wellbutrin or an SNRI.

## 2011-05-29 NOTE — Assessment & Plan Note (Signed)
TSH  Date/Time Value Range Status  05/26/2011  2:51 PM 2.403  0.350-4.500 (uIU/mL) Final     Free T4  Date/Time Value Range Status  05/26/2011  2:51 PM 1.04  0.80-1.80 (ng/dL) Final   Her thyroid function tests are normal today.  We will need to continue to follow up on an occasional basis.

## 2011-05-29 NOTE — Assessment & Plan Note (Addendum)
She continues to struggle with fatigue and motivation.  We have tried several medications but she continues to have problems.  She does have a history of hypothyroidism and anemia as well so we will check today to ensure we are not missing a significant problem that could be causing fatigue besides her depression.  She is mildly anemic which is mildly worse then her last value.  Her last anemia panel showed a iron of 22 and Ferritin of 20.  She does have a history of diverticular bleed as well.  We will have her start iron supplementation for now and recheck her hemoglobin and anemia panel in a few weeks.  Her thyroid is normal today.    CBC:    Component Value Date/Time   WBC 7.4 05/26/2011 1451   HGB 10.7* 05/26/2011 1451   HCT 33.6* 05/26/2011 1451   PLT 251 05/26/2011 1451   MCV 94.6 05/26/2011 1451   NEUTROABS 3.7 01/27/2011 1631   LYMPHSABS 2.3 01/27/2011 1631   MONOABS 0.3 01/27/2011 1631   EOSABS 0.3 01/27/2011 1631   BASOSABS 0.0 01/27/2011 1631   TSH  Date/Time Value Range Status  05/26/2011  2:51 PM 2.403  0.350-4.500 (uIU/mL) Final     Free T4  Date/Time Value Range Status  05/26/2011  2:51 PM 1.04  0.80-1.80 (ng/dL) Final

## 2011-06-01 ENCOUNTER — Other Ambulatory Visit: Payer: Self-pay | Admitting: *Deleted

## 2011-06-01 DIAGNOSIS — B3789 Other sites of candidiasis: Secondary | ICD-10-CM

## 2011-06-01 MED ORDER — CLOTRIMAZOLE 1 % VA CREA: 1.0000 | TOPICAL_CREAM | Freq: Two times a day (BID) | VAGINAL | Status: DC

## 2011-06-01 MED ORDER — POTASSIUM CHLORIDE 10 MEQ PO TBCR: 10.0000 meq | EXTENDED_RELEASE_TABLET | Freq: Every day | ORAL | Status: DC

## 2011-06-22 ENCOUNTER — Ambulatory Visit (INDEPENDENT_AMBULATORY_CARE_PROVIDER_SITE_OTHER): Payer: Medicare Other | Admitting: Internal Medicine

## 2011-06-22 ENCOUNTER — Encounter: Payer: Self-pay | Admitting: Internal Medicine

## 2011-06-22 VITALS — BP 135/62 | HR 59 | Temp 99.1°F | Wt 222.6 lb

## 2011-06-22 DIAGNOSIS — L293 Anogenital pruritus, unspecified: Secondary | ICD-10-CM

## 2011-06-22 DIAGNOSIS — R05 Cough: Secondary | ICD-10-CM

## 2011-06-22 DIAGNOSIS — R300 Dysuria: Secondary | ICD-10-CM

## 2011-06-22 DIAGNOSIS — R059 Cough, unspecified: Secondary | ICD-10-CM

## 2011-06-22 DIAGNOSIS — N898 Other specified noninflammatory disorders of vagina: Secondary | ICD-10-CM

## 2011-06-22 LAB — POCT URINALYSIS DIPSTICK
Bilirubin, UA: NEGATIVE
Blood, UA: NEGATIVE
Glucose, UA: NEGATIVE
Ketones, UA: NEGATIVE
Leukocytes, UA: NEGATIVE
Nitrite, UA: NEGATIVE
Protein, UA: NEGATIVE
Spec Grav, UA: >1.03
Urobilinogen, UA: 0.2
pH, UA: 6

## 2011-06-22 LAB — URINALYSIS, ROUTINE W REFLEX MICROSCOPIC
Bilirubin Urine: NEGATIVE
Glucose, UA: NEGATIVE mg/dL
Hgb urine dipstick: NEGATIVE
Ketones, ur: NEGATIVE mg/dL
Leukocytes, UA: NEGATIVE
Nitrite: NEGATIVE
Protein, ur: NEGATIVE mg/dL
Specific Gravity, Urine: 1.024 (ref 1.005–1.030)
Urobilinogen, UA: 0.2 mg/dL (ref 0.0–1.0)
pH: 6 (ref 5.0–8.0)

## 2011-06-22 MED ORDER — FLUCONAZOLE 150 MG PO TABS: 150.0000 mg | ORAL_TABLET | Freq: Once | ORAL | Status: DC

## 2011-06-22 NOTE — Progress Notes (Signed)
  Subjective:    Patient ID: [name], female    DOB: [birth year], 70 y.o.   MRN: [id number]  HPI: A 70 year-old woman with past medical history significant for hyperlipidemia, hypertension, depression comes to the clinic for a followup visit  She reports having new onset lower abdominal pain for last 3-4 days. She also reports some dysuria and urgency but denies frequency. She also reports some vaginal itching.  She states that since she has been started on new  medications -she has been experiencing some itching in her chest keloid and she thinks that she has also developed some pus in her abdominal area where she had hysterectomy 29 years ago.  She also reports some new onset cough there she is bringing up some greyish phlegm. She also reports subjective fevers. Denies any nausea, vomiting or diarrhea.     Review of Systems  Constitutional: Negative for fever, chills and fatigue.  HENT: Negative for nosebleeds, congestion, rhinorrhea, sneezing and postnasal drip.   Eyes: Negative for photophobia and visual disturbance.  Respiratory: Positive for cough. Negative for choking, chest tightness, shortness of breath, wheezing and stridor.   Cardiovascular: Negative for chest pain, palpitations and leg swelling.  Gastrointestinal: Positive for abdominal pain.  Genitourinary: Positive for dysuria and urgency.  Musculoskeletal: Negative for arthralgias.  Neurological: Negative for dizziness, light-headedness, numbness and headaches.  Hematological: Negative for adenopathy.       Objective:   Physical Exam  Constitutional: She is oriented to person, place, and time. She appears well-developed and well-nourished. No distress.  HENT:  Head: Normocephalic and atraumatic.  Mouth/Throat: No oropharyngeal exudate.  Eyes: Conjunctivae and EOM are normal. Pupils are equal, round, and reactive to light.  Neck: Normal range of motion. Neck supple. No JVD present. No tracheal deviation  present. No thyromegaly present.  Cardiovascular: Normal rate, normal heart sounds and intact distal pulses.  Exam reveals no gallop and no friction rub.   No murmur heard. Pulmonary/Chest: Effort normal and breath sounds normal. No stridor. No respiratory distress. She has no wheezes. She has no rales. She exhibits no tenderness.  Abdominal: Soft. Bowel sounds are normal. She exhibits no distension and no mass. There is no rebound and no guarding.       Mild suprapubic TTP.  Musculoskeletal: Normal range of motion. She exhibits no edema and no tenderness.  Lymphadenopathy:    She has no cervical adenopathy.  Neurological: She is alert and oriented to person, place, and time. She has normal reflexes. She displays normal reflexes. No cranial nerve deficit. Coordination normal.  Skin: Skin is warm. She is not diaphoretic.          Assessment & Plan:

## 2011-06-22 NOTE — Progress Notes (Deleted)
  Subjective:    Patient ID: [name], female    DOB: June 02, 1941, 70 y.o.   MRN: [id number]  HPI    Review of Systems     Objective:   Physical Exam        Assessment & Plan:

## 2011-06-22 NOTE — Patient Instructions (Addendum)
Please take your medicines as prescribed. Please keep up with your appointment with your PCP ( [name]) in September.

## 2011-06-22 NOTE — Assessment & Plan Note (Signed)
Reports new onset cough where she is binging grayish phlegm. On my exam , lungs were CTAB.  I think this is most likely smoker's cough. Will not  treat with any antibiotics.

## 2011-06-22 NOTE — Assessment & Plan Note (Signed)
She reports new onset suprapubic discomfort and dysuria for last 3-4 days associated with some vaginal itching. Differentials include UTI versus Candida versus bacterial vaginosis. - Will treat her empirically with one dose of fluconazole for now. - Check UA.( dipstick reviewed- negative). -Will followup on the results and would get her started on antibiotics if she has infection.

## 2011-06-23 ENCOUNTER — Telehealth: Payer: Self-pay | Admitting: *Deleted

## 2011-06-23 DIAGNOSIS — R05 Cough: Secondary | ICD-10-CM | POA: Insufficient documentation

## 2011-06-23 DIAGNOSIS — R059 Cough, unspecified: Secondary | ICD-10-CM | POA: Insufficient documentation

## 2011-06-23 NOTE — Telephone Encounter (Signed)
Called pt to let her know urines was clear, no uti also spoke w/ PPA, med, diflucan, has gone out- delivery driver has left and it will be delivered today, LM- will call pt back after 1330

## 2011-06-23 NOTE — Telephone Encounter (Signed)
Have called again and left message

## 2011-06-27 ENCOUNTER — Telehealth: Payer: Self-pay | Admitting: *Deleted

## 2011-06-27 DIAGNOSIS — N898 Other specified noninflammatory disorders of vagina: Secondary | ICD-10-CM

## 2011-06-27 MED ORDER — FLUCONAZOLE 150 MG PO TABS
150.0000 mg | ORAL_TABLET | Freq: Once | ORAL | Status: DC
Start: 2011-06-27 — End: 2022-01-24

## 2011-06-27 NOTE — Telephone Encounter (Signed)
Pt calls and states the diflucan is not working, she states she needs an abx for the yeast and urine, it was explained that she had a very good urine. Please advise.

## 2011-06-27 NOTE — Telephone Encounter (Signed)
Veverly Fells,  Thanks for following up.  After discussing with Dr. Coralee Pesa,  We  think  That it would  Be better do her pelvic exam before prescribing her any antibiotics. I callled her for Thursday appointment at 2: 45 PM.

## 2011-06-27 NOTE — Telephone Encounter (Signed)
I reviewed her UA and her urine is clean.  They didn't do a microscopic exam because there was no indication of infection but she may have BV or yeast infection.    Here is what we can do.  I will send through another dose of diflucan and if that doesn't help we will give her some metronidazole for empiric BV treatment.    If she continues to have troubles she will need to be seen for a vaginal exam because she does have a history of atrophic vaginitis that we use premarin cream for.

## 2011-06-28 NOTE — Telephone Encounter (Signed)
Sounds good to me

## 2011-06-29 ENCOUNTER — Encounter: Payer: Medicare Other | Admitting: Internal Medicine

## 2011-07-04 ENCOUNTER — Ambulatory Visit (INDEPENDENT_AMBULATORY_CARE_PROVIDER_SITE_OTHER): Payer: Medicare Other | Admitting: Internal Medicine

## 2011-07-04 ENCOUNTER — Encounter: Payer: Self-pay | Admitting: Internal Medicine

## 2011-07-04 ENCOUNTER — Other Ambulatory Visit: Payer: Self-pay | Admitting: *Deleted

## 2011-07-04 DIAGNOSIS — F329 Major depressive disorder, single episode, unspecified: Secondary | ICD-10-CM

## 2011-07-04 DIAGNOSIS — R5383 Other fatigue: Secondary | ICD-10-CM

## 2011-07-04 DIAGNOSIS — N952 Postmenopausal atrophic vaginitis: Secondary | ICD-10-CM

## 2011-07-04 DIAGNOSIS — R5381 Other malaise: Secondary | ICD-10-CM

## 2011-07-04 MED ORDER — AMLODIPINE BESYLATE 5 MG PO TABS: 5.0000 mg | ORAL_TABLET | Freq: Every day | ORAL | Status: DC

## 2011-07-04 MED ORDER — ESTROGENS, CONJUGATED 0.625 MG/GM VA CREA: 0.5000 g | TOPICAL_CREAM | Freq: Every day | VAGINAL | Status: DC

## 2011-07-04 MED ORDER — LISINOPRIL 40 MG PO TABS: 40.0000 mg | ORAL_TABLET | Freq: Every day | ORAL | Status: DC

## 2011-07-04 MED ORDER — BUPROPION HCL ER (SR) 100 MG PO TB12: 100.0000 mg | ORAL_TABLET | Freq: Two times a day (BID) | ORAL | Status: DC

## 2011-07-04 MED ORDER — CLOPIDOGREL BISULFATE 75 MG PO TABS: 75.0000 mg | ORAL_TABLET | Freq: Every day | ORAL | Status: DC

## 2011-07-04 NOTE — Patient Instructions (Signed)
Stop Prozac  Start Wellbutrin 100 mg SR one tablet daily for your mood  Stop Oxybutynin  Start the using the Premarin for the itching and burning in your vagina.    Follow up with me in 2 weeks to see how you're doing.

## 2011-07-04 NOTE — Progress Notes (Signed)
Subjective:   [name]   DOB: [birth year] 70 y.o.   MRN: [id number]  HPI: Shelby DrakeShelby Drake is a 70 y.o. woman who presents today for follow up from her last appointment with Dr. Dorthula Rue.  She continues to complain about burning, itching, and a dry feeling in her vaginal area.  She took the diflucan and got no relief.  She has not been using her Premarin cream because she has been out for a couple of weeks.  She denies any fevers, chills, dysuria, increased urinary frequency, or vaginal discharge.  She does state she has been having some urinary incontinence that happens occasionally when she stands up.  She hasn't been using her oxybutynin recently because she was using it previously as a sleeping pill and has been sleeping so much lately.  She continues to have problems with fatigue and increased somnolence.  She is tangential as always in the office today and makes several off hand comments about the police being called by friends to check on her.  She also states that she has sometimes where she will fall asleep and wake up and almost a whole day has passed.  She is losing weight and states on several occasions that she feels "like the devil is trying to take her away."  She has a hyperreligiousity and some delusions that the devil is out to get her but it is hard to piece together what she is actually experiencing because of her rambling and inability to stay on topic.  She is directable but if left to her own devices will continue to ramble and move from one topic to the next with no apparent connection.    Past Medical History  Diagnosis Date  . History of cervical cancer 1972  . Ovarian cancer 1972    S/P oophorectomy  . GERD (gastroesophageal reflux disease)   . Gout   . Hypertension   . DJD (degenerative joint disease), multiple sites     Low back pain worst  . DDD (degenerative disc disease), lumbosacral   . Osteoarthritis of hip     bilateral hips  .  Hx-TIA (transient ischemic attack) 05/13/2001    Right facial numbness  . Sensorineural hearing loss of both ears   . Mitral valve prolapse 10/27/1999    Subsequent 2D echo show normal mitral valve  . Chronic venous insufficiency   . Colon, diverticulosis Sept. 2011    outpatient colonoscopy by Dr. [name].  Need record  . Hyperlipidemia   . Recurrent boils     History of MRSA skin infections with abscess  . Gout     Uric Acid level 4.2 on 300 allopurinol  . Depression    Current Outpatient Prescriptions  Medication Sig Dispense Refill  . allopurinol (ZYLOPRIM) 300 MG tablet Take 1 tablet (300 mg total) by mouth daily.  30 tablet  6  . amLODipine (NORVASC) 5 MG tablet Take 5 mg by mouth daily.        [name] Apple Cider Vinegar 300 MG TABS Take 1 tablet by mouth daily.        . Ascorbic Acid (VITAMIN C) 500 MG tablet Take 500 mg by mouth daily.        [name] atorvastatin (LIPITOR) 80 MG tablet Take 80 mg by mouth daily.        . benzocaine-Menthol (DERMOPLAST) 20-0.5 % AERO Apply 1 application topically 3 (three) times daily as needed. Apply to back as needed for itching and  skin irritation       . Biotin (BIOTIN 5000) 5 MG CAPS Take 1 capsule by mouth daily.        . Calcium Carbonate-Vitamin D (CALCIUM-VITAMIN D) 500-200 MG-UNIT per tablet Take 1 tablet by mouth daily.        . cetirizine (ZYRTEC) 10 MG tablet Take 1 tablet (10 mg total) by mouth daily.  30 tablet  6  . clopidogrel (PLAVIX) 75 MG tablet Take 75 mg by mouth daily.        . clotrimazole (GYNE-LOTRIMIN) 1 % vaginal cream Place 1 Applicatorful vaginally 2 (two) times daily.  45 g  2  . conjugated estrogens (PREMARIN) vaginal cream Place 0.5 Applicatorfuls vaginally daily.  42.5 g  3  . Elastic Bandages & Supports (T.E.D. KNEE LENGTH/L-REGULAR) MISC 1 Units by Does not apply route daily.        . ferrous sulfate 325 (65 FE) MG tablet Take 1 tablet (325 mg total) by mouth daily.  30 tablet  3  . fluocinonide (LIDEX) 0.05 % cream Apply  1 application topically 2 (two) times daily.  60 g  3  . FLUoxetine (PROZAC) 20 MG capsule Take 1 capsule (20 mg total) by mouth daily.  30 capsule  2  . furosemide (LASIX) 20 MG tablet Take 1 tablet (20 mg total) by mouth daily. Okay to use one more tablet daily for increased swelling as needed.  45 tablet  6  . lisinopril (PRINIVIL,ZESTRIL) 40 MG tablet Take 40 mg by mouth daily.        [name] nystatin (NYSTOP) 100000 UNIT/GM POWD Apply 1 g (100,000 Units total) topically 2 (two) times daily. Apply to affected area twice daily  30 g  2  . oxybutynin (DITROPAN-XL) 5 MG 24 hr tablet Take 1 tablet (5 mg total) by mouth at bedtime.  30 tablet  6  . pantoprazole (PROTONIX) 40 MG tablet Take 1 tablet (40 mg total) by mouth daily.  30 tablet  6  . piroxicam (FELDENE) 20 MG capsule Take 20 mg by mouth daily.        . polyethylene glycol (MIRALAX / GLYCOLAX) packet Take 17 g by mouth daily as needed.        . potassium chloride (KLOR-CON) 10 MEQ CR tablet Take 1 tablet (10 mEq total) by mouth daily.  30 tablet  4  . triamcinolone (KENALOG) 0.1 % cream Apply topically 2 (two) times daily.  30 g  5   No family history on file. History   Social History  . Marital Status: Widowed    Spouse Name: N/A    Number of Children: N/A  . Years of Education: N/A   Social History Main Topics  . Smoking status: Former Smoker    Quit date: 10/30/1997  . Smokeless tobacco: Never Used  . Alcohol Use: No  . Drug Use: No  . Sexually Active: No   Other Topics Concern  . Not on file   Social History Narrative  . No narrative on file   Review of Systems: Constitutional: Denies fever, chills, diaphoresis, appetite change and fatigue.  HEENT: Denies photophobia, eye pain, redness, hearing loss, ear pain, congestion, sore throat, rhinorrhea, sneezing, mouth sores, trouble swallowing, neck pain, neck stiffness and tinnitus.   Respiratory: Denies SOB, DOE, cough, chest tightness,  and wheezing.   Cardiovascular:  Denies chest pain, palpitations and leg swelling.  Gastrointestinal: Denies nausea, vomiting, abdominal pain, diarrhea, constipation, blood in stool and abdominal distention.  Genitourinary: Denies dysuria, urgency, frequency, hematuria, flank pain and difficulty urinating.  Musculoskeletal: Denies myalgias, back pain, joint swelling, arthralgias and gait problem.  Skin: Denies pallor, rash and wound.  Neurological: Denies dizziness, seizures, syncope, weakness, light-headedness, numbness and headaches.  Hematological: Denies adenopathy. Easy bruising, personal or family bleeding history  Psychiatric/Behavioral: Denies suicidal ideation, mood changes, confusion, nervousness, sleep disturbance and agitation  Objective:  Physical Exam: Filed Vitals:   07/04/11 1117  BP: 111/68  Pulse: 59  Temp: 98.4 F (36.9 C)  TempSrc: Oral  Height: 5' 4.5" (1.638 m)  Weight: 221 lb 14.4 oz (100.653 kg)   Constitutional: Vital signs reviewed.  Patient is a well-developed and well-nourished woman in no acute distress and cooperative with exam. Alert and oriented x3.  Head: Normocephalic and atraumatic Ear: TM normal bilaterally Mouth: no erythema or exudates, MMM Eyes: PERRL, EOMI, conjunctivae normal, No scleral icterus.  Neck: Supple, Trachea midline normal ROM, No JVD, mass, thyromegaly, or carotid bruit present.  Cardiovascular: RRR, S1 normal, S2 normal, no MRG, pulses symmetric and intact bilaterally Pulmonary/Chest: CTAB, no wheezes, rales, or rhonchi Abdominal: Soft. Non-tender, non-distended, bowel sounds are normal, no masses, organomegaly, or guarding present.  GU: no CVA tenderness, vaginal exam performed with Dr. Phillips Odor present.  External inspection shows no lesions or drainage.  No prolapse noted with vagal maneuver,  Vaginal tissue is dry and tacky with little rugation.  Speculum exam not performed due to patient discomfort.  Musculoskeletal: No joint deformities, erythema, or stiffness,  ROM full and no nontender Hematology: no cervical, inginal, or axillary adenopathy.  Neurological: A&O x3, Strenght is normal and symmetric bilaterally, cranial nerve II-XII are grossly intact, no focal motor deficit, sensory intact to light touch bilaterally.  Skin: Warm, dry and intact. No rash, cyanosis, or clubbing.  Psychiatric: Normal mood and variable affect. speech is rambling and tangential but behavior is normal. Judgment and thought content appear grossly normal with a fixation on her religion, god, and the devil. Cognition and memory are normal.   Assessment & Plan:

## 2011-07-04 NOTE — Progress Notes (Signed)
[facility] was called to see if they would deliver Welbutrin to pt today per [name]; but she stated since it is not an emergency med, it will be delivered tomorrow. Pt was called; message left on machine.

## 2011-07-05 MED ORDER — BUPROPION HCL ER (SR) 100 MG PO TB12: 100.0000 mg | ORAL_TABLET | Freq: Two times a day (BID) | ORAL | Status: DC

## 2011-07-06 ENCOUNTER — Telehealth: Payer: Self-pay | Admitting: Licensed Clinical Social Worker

## 2011-07-06 NOTE — Assessment & Plan Note (Signed)
Vaginal exam reveals dry and poorly rugated tissue that is consistent with atrophic vaginitis.  She has not been using her Premarin cream so we will restart that to help her reduce the itching.  There was no drainage that would suggest a yeast infection or BV.  Urinalysis was normal at her last visit just under a week ago when the symptoms first started.

## 2011-07-06 NOTE — Telephone Encounter (Signed)
Spoke with [name] in Marketing who said they were still trying to get [name] onto the program.  They are searching for a neighbor or friend who could serve as a support for her.  Cathlyn Parsons said she will get with Marny Lowenstein the director to determine one way or another.

## 2011-07-06 NOTE — Assessment & Plan Note (Signed)
She continues to struggle with her depression and I suspect there is more going on here then just straight depression.  She has some hallmarks of schizoaffective disorder vs depression with psychotic features.  She has a long history of mental illness that is hard to discern due to her rambling, tangential thought pattern.  With the continued fatigue we will stop her prozac and try her on Wellbutrin.  I also think that she needs a true psychiatrist to help her.  We will make a referral to mental health and see if they can help her with her symptoms.

## 2011-07-06 NOTE — Assessment & Plan Note (Signed)
She continues to be fatigued and sleeping a lot. She has several medications on her list that could be the cause. We stopped her Oxybutynin today and will see it helps her not be as tired.  We will also switch her antidepressant around to see if that will help.

## 2011-07-14 ENCOUNTER — Telehealth: Payer: Self-pay | Admitting: *Deleted

## 2011-07-14 NOTE — Telephone Encounter (Signed)
i have called pt 3 times this pm, will try once more

## 2011-07-14 NOTE — Telephone Encounter (Signed)
Pt calls and leaves message that both triage and dr Tonny Branch listen to, pt states she is up all night then sleeps from 0300-0400 to 1500-1600, she states the wellbutrin is causing this problem and she is only taking 1 at the moment. She states the police were called to her house by someone in the neighborhood and they stated they would have to take her "somewhere" if she couldn't get some help with this problem. i called the dispatch center for [employer] and it was stated by the dispatcher that [employer] had not been to pt's home since 01/2011.  i have attempted to call pt after discussing w/ dr Tonny Branch to have pt go to [facility] for mental health eval. i have left a message that i will call her back in 30 to 45 mins and will do so. i will also call [facility] and ask if it is advisable to cut the wellbutrin pill in half.

## 2011-07-19 ENCOUNTER — Telehealth: Payer: Self-pay | Admitting: Licensed Clinical Social Worker

## 2011-07-19 ENCOUNTER — Ambulatory Visit (INDEPENDENT_AMBULATORY_CARE_PROVIDER_SITE_OTHER): Payer: Medicare Other | Admitting: Internal Medicine

## 2011-07-19 ENCOUNTER — Encounter: Payer: Self-pay | Admitting: Internal Medicine

## 2011-07-19 DIAGNOSIS — J309 Allergic rhinitis, unspecified: Secondary | ICD-10-CM

## 2011-07-19 DIAGNOSIS — D638 Anemia in other chronic diseases classified elsewhere: Secondary | ICD-10-CM

## 2011-07-19 DIAGNOSIS — I10 Essential (primary) hypertension: Secondary | ICD-10-CM

## 2011-07-19 DIAGNOSIS — F329 Major depressive disorder, single episode, unspecified: Secondary | ICD-10-CM

## 2011-07-19 LAB — CBC
HCT: 34.6 % — ABNORMAL LOW (ref 36.0–46.0)
Hemoglobin: 11.2 g/dL — ABNORMAL LOW (ref 12.0–15.0)
MCH: 31.1 pg (ref 26.0–34.0)
MCHC: 32.4 g/dL (ref 30.0–36.0)
MCV: 96.1 fL (ref 78.0–100.0)
Platelets: 262 10*3/uL (ref 150–400)
RBC: 3.6 MIL/uL — ABNORMAL LOW (ref 3.87–5.11)
RDW: 17.3 % — ABNORMAL HIGH (ref 11.5–15.5)
WBC: 7.1 10*3/uL (ref 4.0–10.5)

## 2011-07-19 LAB — IRON AND TIBC
%SAT: 11 % — ABNORMAL LOW (ref 20–55)
Iron: 30 ug/dL — ABNORMAL LOW (ref 42–145)
TIBC: 262 ug/dL (ref 250–470)
UIBC: 232 ug/dL (ref 125–400)

## 2011-07-19 LAB — FERRITIN: Ferritin: 28 ng/mL (ref 10–291)

## 2011-07-19 MED ORDER — FLUTICASONE PROPIONATE 50 MCG/ACT NA SUSP: 2.0000 | Freq: Every day | NASAL | Status: DC

## 2011-07-19 NOTE — Progress Notes (Signed)
Subjective:   [name]   DOB: Apr 02, 1941 70 y.o.   MRN: [id number]  HPI: [name] is a 70 y.o. woman who presents to clinic today for a follow up from her last appointment.  She states that the Wellbutrin hasn't helped her at all.  She has been sleeping through days and not realizing it.  She denies any current SI/HI.  She also states that people from her church have been concerned about her.  She called and left a message with the clinic stating that the police had been out to her house and told her that if "she didn't get straightened out they were going to take her off to a home."  She states that she feels like the devil is trying to take her away and if we could get her to her 70th birthday she could die happy.  She denies any visual hallucinations, seizures, or altered mental status.  She states that she feels like she has worms in her ears and that her belly is full of pus again.    She also states that she has been constipated and is wondering if she needs to take her iron pills which she thinks are constipating her.  She denies any SOB while walking or blood in her stool.  She does have a history of melena and bright red blood per rectum and was scoped by Dr. Ronney Asters last fall.    She also states that she has had a cough for several days.  It is worse at night and when she is lying down.  There is no production.  She has a clear nasal drainage and itching in her eyes with the cough.  She has been taking her Zyrtec daily.   Past Medical History  Diagnosis Date  . History of cervical cancer 1972  . Ovarian cancer 1972    S/P oophorectomy  . GERD (gastroesophageal reflux disease)   . Gout   . Hypertension   . DJD (degenerative joint disease), multiple sites     Low back pain worst  . DDD (degenerative disc disease), lumbosacral   . Osteoarthritis of hip     bilateral hips  . Hx-TIA (transient ischemic attack) 05/13/2001    Right facial numbness    . Sensorineural hearing loss of both ears   . Mitral valve prolapse 10/27/1999    Subsequent 2D echo show normal mitral valve  . Chronic venous insufficiency   . Colon, diverticulosis Sept. 2011    outpatient colonoscopy by Dr. [name].  Need record  . Hyperlipidemia   . Recurrent boils     History of MRSA skin infections with abscess  . Gout     Uric Acid level 4.2 on 300 allopurinol  . Depression    Current Outpatient Prescriptions  Medication Sig Dispense Refill  . allopurinol (ZYLOPRIM) 300 MG tablet Take 1 tablet (300 mg total) by mouth daily.  30 tablet  6  . amLODipine (NORVASC) 5 MG tablet Take 1 tablet (5 mg total) by mouth daily.  31 tablet  6  . Apple Cider Vinegar 300 MG TABS Take 1 tablet by mouth daily.        . Ascorbic Acid (VITAMIN C) 500 MG tablet Take 500 mg by mouth daily.        [name] atorvastatin (LIPITOR) 80 MG tablet Take 80 mg by mouth daily.        . benzocaine-Menthol (DERMOPLAST) 20-0.5 % AERO Apply 1 application  topically 3 (three) times daily as needed. Apply to back as needed for itching and skin irritation       . Biotin (BIOTIN 5000) 5 MG CAPS Take 1 capsule by mouth daily.        [name] buPROPion (WELLBUTRIN SR) 100 MG 12 hr tablet Take 1 tablet (100 mg total) by mouth 2 (two) times daily.  60 tablet  6  . Calcium Carbonate-Vitamin D (CALCIUM-VITAMIN D) 500-200 MG-UNIT per tablet Take 1 tablet by mouth daily.        . cetirizine (ZYRTEC) 10 MG tablet Take 1 tablet (10 mg total) by mouth daily.  30 tablet  6  . clopidogrel (PLAVIX) 75 MG tablet Take 1 tablet (75 mg total) by mouth daily.  31 tablet  6  . clotrimazole (GYNE-LOTRIMIN) 1 % vaginal cream Place 1 Applicatorful vaginally 2 (two) times daily.  45 g  2  . conjugated estrogens (PREMARIN) vaginal cream Place 0.5 Applicatorfuls vaginally daily.  45 g  3  . Elastic Bandages & Supports (T.E.D. KNEE LENGTH/L-REGULAR) MISC 1 Units by Does not apply route daily.        . ferrous sulfate 325 (65 FE) MG tablet  Take 1 tablet (325 mg total) by mouth daily.  30 tablet  3  . fluocinonide (LIDEX) 0.05 % cream Apply 1 application topically 2 (two) times daily.  60 g  3  . furosemide (LASIX) 20 MG tablet Take 1 tablet (20 mg total) by mouth daily. Okay to use one more tablet daily for increased swelling as needed.  45 tablet  6  . lisinopril (PRINIVIL,ZESTRIL) 40 MG tablet Take 1 tablet (40 mg total) by mouth daily.  31 tablet  6  . nystatin (NYSTOP) 100000 UNIT/GM POWD Apply 1 g (100,000 Units total) topically 2 (two) times daily. Apply to affected area twice daily  30 g  2  . pantoprazole (PROTONIX) 40 MG tablet Take 1 tablet (40 mg total) by mouth daily.  30 tablet  6  . piroxicam (FELDENE) 20 MG capsule Take 20 mg by mouth daily.        . polyethylene glycol (MIRALAX / GLYCOLAX) packet Take 17 g by mouth daily as needed.        . potassium chloride (KLOR-CON) 10 MEQ CR tablet Take 1 tablet (10 mEq total) by mouth daily.  30 tablet  4  . triamcinolone (KENALOG) 0.1 % cream Apply topically 2 (two) times daily.  30 g  5   No family history on file. History   Social History  . Marital Status: Widowed    Spouse Name: N/A    Number of Children: N/A  . Years of Education: N/A   Social History Main Topics  . Smoking status: Former Smoker    Quit date: 10/30/1997  . Smokeless tobacco: Never Used  . Alcohol Use: No  . Drug Use: No  . Sexually Active: No   Other Topics Concern  . None   Social History Narrative  . None   Review of Systems: Negative except for as noted in the HPI.   Objective:  Physical Exam: Filed Vitals:   07/19/11 1046  BP: 112/67  Pulse: 66  Temp: 97.8 F (36.6 C)  TempSrc: Oral  Weight: 224 lb 6.4 oz (101.787 kg)   Constitutional: Vital signs reviewed.  Patient is a well-developed and well-nourished woman in no acute distress and cooperative with exam. Alert and oriented x3. She is tangential and stream of consciousness with her  conversation.    Head: Normocephalic  and atraumatic Ear: TM normal bilaterally Mouth: no erythema or exudates, MMM Nose: mild erythema and irritation noted bilaterally.  Eyes: PERRL, EOMI, conjunctivae normal, No scleral icterus.  Neck: Supple, Trachea midline normal ROM, No JVD, mass, thyromegaly, or carotid bruit present.  Cardiovascular: RRR, S1 normal, S2 normal, no MRG, pulses symmetric and intact bilaterally Pulmonary/Chest: CTAB, no wheezes, rales, or rhonchi Abdominal: Soft. Non-tender, non-distended, bowel sounds are normal, no masses, organomegaly, or guarding present.  Skin: Warm, dry and intact. No rash, cyanosis, or clubbing.  Psychiatric: Tearful mood and variable affect. speech is not pressured but tangential and stream of consciousness.  Judgment and insight are limited.  Thought content abnormal with thoughts of worms in her ears and pus in her belly.  Cognition and memory are normal.   Assessment & Plan:

## 2011-07-19 NOTE — Patient Instructions (Addendum)
Stop in the lab and get your blood drawn for your iron level.  I will call you if we have to follow up on anything.  We will try to get you an appointment with the psychiatrist and I will also follow up with [facility] to see where everything is at.  Follow up in about 2 months after your psychiatrist visit to see how things are going.

## 2011-07-20 ENCOUNTER — Telehealth: Payer: Self-pay | Admitting: Licensed Clinical Social Worker

## 2011-07-20 NOTE — Telephone Encounter (Signed)
I have a call into [facility] to find out what there decision is regarding taking [place] onto the program.  They have an issue with no willing and able caregiver.

## 2011-07-20 NOTE — Telephone Encounter (Signed)
Multiple phone calls:  Discussed case w/ [employer]/[name].  [name] is overincome for that program.  Income cutoff is $931.  Called [facility] and that costs $10 per visit for nursing services and $4 hour for an aide.  They have a 6 mos wait list.   Care Mgmt program:  Free but has a huge wait list.   [facility]:  Still waiting on their decision as to whether  Son:  Call into [employer] [facility] [employer] church where son is youth minister there. Works in the PM after 2:00 PM.

## 2011-07-24 ENCOUNTER — Other Ambulatory Visit: Payer: Self-pay | Admitting: Internal Medicine

## 2011-07-24 MED ORDER — OXYBUTYNIN CHLORIDE ER 5 MG PO TB24: 5.0000 mg | ORAL_TABLET | Freq: Every day | ORAL | Status: DC

## 2011-07-24 NOTE — Assessment & Plan Note (Signed)
Office Visit on 07/19/2011  Component Date Value Range Status  . WBC (K/uL) 07/19/2011 7.1  4.0-10.5 Final  . RBC (MIL/uL) 07/19/2011 3.60* 3.87-5.11 Final  . Hemoglobin (g/dL) 57/84/6962 95.2* 84.1-32.4 Final  . HCT (%) 07/19/2011 34.6* 36.0-46.0 Final  . MCV (fL) 07/19/2011 96.1  78.0-100.0 Final  . MCH (pg) 07/19/2011 31.1  26.0-34.0 Final  . MCHC (g/dL) 40/07/2724 36.6  44.0-34.7 Final  . RDW (%) 07/19/2011 17.3* 11.5-15.5 Final  . Platelets (K/uL) 07/19/2011 262  150-400 Final         . Ferritin (ng/mL) 07/19/2011 28  10-291 Final  . Iron (ug/dL) 42/59/5638 30* 75-643 Final  . UIBC (ug/dL) 32/95/1884 166  063-016 Final  . TIBC (ug/dL) 11/07/3233 573  220-254 Final  . %SAT (%) 07/19/2011 11* 20-55 Final   Her HgB is mildly increased from previously though her iron stores are still low.  We will continue her on iron for now and encourage her to use an OTC stool softener.  She was also told that more activity will help with the constipation.

## 2011-07-24 NOTE — Assessment & Plan Note (Addendum)
I am concerned about Ms. Acre.  She has several delusions including delusions of persecution and delusions of illnesses that aren't present.  She is tangential and tends to ramble if left unguided.  I am able to redirect her but it is short lived.  She has a hyperreligiousity that manifests in her feeling that the devil is out to get her.  There is some question of auditory hallucinations including that she had a visit from the [place] police that told her she was going to be taken away if she didn't straighten out.  We called the [employer] and they stated that no one had been out to her house since April.  When confronted about this her story changed and that it was people from her [employer] that told her this.  It is very hard to get a history from her because of her jumbled thoughts and rambling speech.  I have tried Prozac, Wellbutrin, and Celexa all at sufficient doses and times for them to work and she has only deteriorated.  At this point we need to get her seen by Psychiatry to see if they can work out what we can do to help her.  I'm concerned that she has some depression with psychotic features or a schizoaffective disorder.  After discussion with Dorothe Pea we will send a copy of the note and my concerns to [facility] and try to get her an appointment.  Once the appointment is made we will do a doctor to doctor so I can relay my concerns.

## 2011-07-24 NOTE — Assessment & Plan Note (Signed)
Lab Results  Component Value Date   NA 142 05/26/2011   K 3.9 05/26/2011   CL 108 05/26/2011   CO2 24 05/26/2011   BUN 19 05/26/2011   CREATININE 1.27* 05/26/2011   CREATININE 1.47* 06/30/2010    BP Readings from Last 3 Encounters:  07/19/11 112/67  07/04/11 111/68  06/22/11 135/62    Assessment: Hypertension control:  controlled  Progress toward goals:  at goal Barriers to meeting goals:  lack of understanding of disease management  Plan: Hypertension treatment:  continue current medications

## 2011-07-24 NOTE — Assessment & Plan Note (Signed)
She has been taking her Zyrtec but we will start Flonase to help with the nasal drainage and itchy eyes.

## 2011-07-26 ENCOUNTER — Telehealth: Payer: Self-pay | Admitting: *Deleted

## 2011-07-26 NOTE — Telephone Encounter (Signed)
Pt called asking for a change in her medicine. She is asking for you to call her at [phone], she states the pharmacy can't give her the meds she wants.

## 2011-08-02 ENCOUNTER — Telehealth: Payer: Self-pay | Admitting: *Deleted

## 2011-08-02 ENCOUNTER — Other Ambulatory Visit: Payer: Self-pay | Admitting: *Deleted

## 2011-08-02 DIAGNOSIS — M109 Gout, unspecified: Secondary | ICD-10-CM

## 2011-08-02 MED ORDER — ATORVASTATIN CALCIUM 80 MG PO TABS: 80.0000 mg | ORAL_TABLET | Freq: Every day | ORAL | Status: DC

## 2011-08-02 MED ORDER — ALLOPURINOL 300 MG PO TABS: 300.0000 mg | ORAL_TABLET | Freq: Every day | ORAL | Status: DC

## 2011-08-02 NOTE — Telephone Encounter (Signed)
Attempted to call- no answer

## 2011-08-02 NOTE — Telephone Encounter (Signed)
I have no idea what this is about but I agree that she should be seen and evaluated.

## 2011-08-02 NOTE — Telephone Encounter (Signed)
Attempted to call [name] to get more information on what she is experiencing.  She does have a history of MRSA boils on her chest and underarms and underwent I&D back in 2008.  When I saw her last there were no areas that were red and swollen on her abdomen.  If you get a hold of her and she is in panic mode try to get her seen and have whoever she is scheduled with call me to discuss the case.  My phone number is [phone].

## 2011-08-02 NOTE — Telephone Encounter (Signed)
Pharmacy made awared. 

## 2011-08-02 NOTE — Telephone Encounter (Signed)
Request refill on Fluconazole.   Thanks

## 2011-08-02 NOTE — Telephone Encounter (Signed)
Also request refill on:  Fluconazole 150mg  - Take 1 tab by mouth daily.

## 2011-08-02 NOTE — Telephone Encounter (Signed)
On her last vaginal exam there was no yeast present.  She has burning and itching from the atrophic vaginitis.  If she has drainage, itching, and burning she needs to be seen to have a wet prep done.  She has so many medications and will continue taking them even if the clinical indication for taking them has gone away.

## 2011-08-02 NOTE — Telephone Encounter (Signed)
Pt calls and talks for quite awhile, states she is going to die if something is not done to help her soon. She states that dr Tonny Branch will not talk to dr Meredith Pel about the possibility of her having pus in her abd again, states she feels just like she felt then. She states she likes dr Tonny Branch, "he is a good catholic boy" and she wants to keep him but he needs to talk to dr Meredith Pel and then get her a xray of her abd. She states that dr Ophelia Charter told her that bloodwork doesn't always give the answers to problems. i told her i would speak w/ dr Tonny Branch and dr Meredith Pel and call her back

## 2011-08-03 ENCOUNTER — Ambulatory Visit (INDEPENDENT_AMBULATORY_CARE_PROVIDER_SITE_OTHER): Payer: Medicare Other | Admitting: *Deleted

## 2011-08-03 DIAGNOSIS — Z23 Encounter for immunization: Secondary | ICD-10-CM

## 2011-08-08 LAB — URINALYSIS, ROUTINE W REFLEX MICROSCOPIC
Bilirubin Urine: NEGATIVE
Bilirubin Urine: NEGATIVE
Glucose, UA: NEGATIVE
Glucose, UA: NEGATIVE
Hgb urine dipstick: NEGATIVE
Ketones, ur: NEGATIVE
Ketones, ur: NEGATIVE
Leukocytes, UA: NEGATIVE
Nitrite: NEGATIVE
Nitrite: NEGATIVE
Protein, ur: 30 — AB
Protein, ur: NEGATIVE
Specific Gravity, Urine: 1.01
Specific Gravity, Urine: 1.025
Urobilinogen, UA: 2 — ABNORMAL HIGH
Urobilinogen, UA: >8 — ABNORMAL HIGH
pH: 5.5
pH: 6

## 2011-08-08 LAB — PROTIME-INR
INR: 1.2
Prothrombin Time: 15.9 — ABNORMAL HIGH

## 2011-08-08 LAB — CBC
HCT: 24.7 — ABNORMAL LOW
HCT: 25.1 — ABNORMAL LOW
HCT: 25.3 — ABNORMAL LOW
HCT: 25.7 — ABNORMAL LOW
HCT: 25.9 — ABNORMAL LOW
HCT: 28.5 — ABNORMAL LOW
HCT: 32.7 — ABNORMAL LOW
Hemoglobin: 10.7 — ABNORMAL LOW
Hemoglobin: 8.3 — ABNORMAL LOW
Hemoglobin: 8.5 — ABNORMAL LOW
Hemoglobin: 8.5 — ABNORMAL LOW
Hemoglobin: 8.5 — ABNORMAL LOW
Hemoglobin: 8.6 — ABNORMAL LOW
Hemoglobin: 9.3 — ABNORMAL LOW
MCHC: 32.6
MCHC: 32.8
MCHC: 33.2
MCHC: 33.2
MCHC: 33.4
MCHC: 33.4
MCHC: 33.9
MCV: 92.1
MCV: 92.9
MCV: 94
MCV: 94.1
MCV: 94.3
MCV: 94.4
MCV: 94.4
Platelets: 157
Platelets: 170
Platelets: 181
Platelets: 206
Platelets: 208
Platelets: 250
Platelets: 292
RBC: 2.66 — ABNORMAL LOW
RBC: 2.66 — ABNORMAL LOW
RBC: 2.72 — ABNORMAL LOW
RBC: 2.74 — ABNORMAL LOW
RBC: 2.75 — ABNORMAL LOW
RBC: 3.03 — ABNORMAL LOW
RBC: 3.48 — ABNORMAL LOW
RDW: 16.6 — ABNORMAL HIGH
RDW: 16.6 — ABNORMAL HIGH
RDW: 16.9 — ABNORMAL HIGH
RDW: 16.9 — ABNORMAL HIGH
RDW: 17 — ABNORMAL HIGH
RDW: 17 — ABNORMAL HIGH
RDW: 17.7 — ABNORMAL HIGH
WBC: 10.6 — ABNORMAL HIGH
WBC: 13.5 — ABNORMAL HIGH
WBC: 14.8 — ABNORMAL HIGH
WBC: 19.8 — ABNORMAL HIGH
WBC: 9
WBC: 9.1
WBC: 9.3

## 2011-08-08 LAB — DIFFERENTIAL
Basophils Absolute: 0
Basophils Absolute: 0.1
Basophils Relative: 0
Basophils Relative: 1
Eosinophils Absolute: 0.1
Eosinophils Absolute: 0.1
Eosinophils Relative: 0
Eosinophils Relative: 1
Lymphocytes Relative: 12
Lymphocytes Relative: 8 — ABNORMAL LOW
Lymphs Abs: 1.2
Lymphs Abs: 2.3
Monocytes Absolute: 0.4
Monocytes Absolute: 0.9 — ABNORMAL HIGH
Monocytes Relative: 3
Monocytes Relative: 5
Neutro Abs: 13.1 — ABNORMAL HIGH
Neutro Abs: 16.3 — ABNORMAL HIGH
Neutrophils Relative %: 83 — ABNORMAL HIGH
Neutrophils Relative %: 89 — ABNORMAL HIGH

## 2011-08-08 LAB — BASIC METABOLIC PANEL
BUN: 11
BUN: 13
BUN: 15
BUN: 17
BUN: 18
BUN: 8
CO2: 20
CO2: 21
CO2: 22
CO2: 22
CO2: 22
CO2: 24
Calcium: 7.9 — ABNORMAL LOW
Calcium: 8 — ABNORMAL LOW
Calcium: 8.2 — ABNORMAL LOW
Calcium: 8.2 — ABNORMAL LOW
Calcium: 8.3 — ABNORMAL LOW
Calcium: 8.6
Chloride: 105
Chloride: 106
Chloride: 107
Chloride: 108
Chloride: 108
Chloride: 109
Creatinine, Ser: 0.87
Creatinine, Ser: 1
Creatinine, Ser: 1.03
Creatinine, Ser: 1.3 — ABNORMAL HIGH
Creatinine, Ser: 1.36 — ABNORMAL HIGH
Creatinine, Ser: 1.53 — ABNORMAL HIGH
GFR calc Af Amer: 41 — ABNORMAL LOW
GFR calc Af Amer: 47 — ABNORMAL LOW
GFR calc Af Amer: 50 — ABNORMAL LOW
GFR calc Af Amer: >60
GFR calc Af Amer: >60
GFR calc Af Amer: >60
GFR calc non Af Amer: 34 — ABNORMAL LOW
GFR calc non Af Amer: 39 — ABNORMAL LOW
GFR calc non Af Amer: 41 — ABNORMAL LOW
GFR calc non Af Amer: 54 — ABNORMAL LOW
GFR calc non Af Amer: 55 — ABNORMAL LOW
GFR calc non Af Amer: >60
Glucose, Bld: 100 — ABNORMAL HIGH
Glucose, Bld: 101 — ABNORMAL HIGH
Glucose, Bld: 109 — ABNORMAL HIGH
Glucose, Bld: 145 — ABNORMAL HIGH
Glucose, Bld: 97
Glucose, Bld: 99
Potassium: 3.3 — ABNORMAL LOW
Potassium: 3.3 — ABNORMAL LOW
Potassium: 3.5
Potassium: 3.6
Potassium: 3.7
Potassium: 4.3
Sodium: 133 — ABNORMAL LOW
Sodium: 134 — ABNORMAL LOW
Sodium: 135
Sodium: 136
Sodium: 137
Sodium: 138

## 2011-08-08 LAB — CULTURE, BLOOD (ROUTINE X 2)
Culture: NO GROWTH
Culture: NO GROWTH
Culture: NO GROWTH
Culture: NO GROWTH

## 2011-08-08 LAB — URINE CULTURE
Colony Count: NO GROWTH
Culture: NO GROWTH
Special Requests: NEGATIVE

## 2011-08-08 LAB — CK TOTAL AND CKMB (NOT AT ARMC)
CK, MB: 1.1
CK, MB: 1.9
Relative Index: 1
Relative Index: 1.8
Total CK: 104
Total CK: 109

## 2011-08-08 LAB — WOUND CULTURE: Gram Stain: NONE SEEN

## 2011-08-08 LAB — OCCULT BLOOD X 1 CARD TO LAB, STOOL: Fecal Occult Bld: NEGATIVE

## 2011-08-08 LAB — COMPREHENSIVE METABOLIC PANEL
ALT: 18
AST: 19
Albumin: 2.9 — ABNORMAL LOW
Alkaline Phosphatase: 102
BUN: 16
CO2: 19
Calcium: 8.2 — ABNORMAL LOW
Chloride: 104
Creatinine, Ser: 1.66 — ABNORMAL HIGH
GFR calc Af Amer: 37 — ABNORMAL LOW
GFR calc non Af Amer: 31 — ABNORMAL LOW
Glucose, Bld: 97
Potassium: 4.2
Sodium: 133 — ABNORMAL LOW
Total Bilirubin: 1.1
Total Protein: 6.2

## 2011-08-08 LAB — TROPONIN I
Troponin I: 0.03
Troponin I: 0.04

## 2011-08-08 LAB — FERRITIN: Ferritin: 184 (ref 10–291)

## 2011-08-08 LAB — TISSUE CULTURE

## 2011-08-08 LAB — VANCOMYCIN, TROUGH: Vancomycin Tr: 18.4

## 2011-08-08 LAB — IRON AND TIBC
Iron: 10 — ABNORMAL LOW
Saturation Ratios: 6 — ABNORMAL LOW
TIBC: 170 — ABNORMAL LOW
UIBC: 160

## 2011-08-08 LAB — MAGNESIUM: Magnesium: 2

## 2011-08-08 LAB — CORTISOL: Cortisol, Plasma: 19

## 2011-08-08 LAB — CARDIAC PANEL(CRET KIN+CKTOT+MB+TROPI)
CK, MB: 1.1
Relative Index: 1
Total CK: 115
Troponin I: 0.02

## 2011-08-08 LAB — ANAEROBIC CULTURE

## 2011-08-08 LAB — POCT CARDIAC MARKERS
CKMB, poc: <1 — ABNORMAL LOW
Myoglobin, poc: 294
Operator id: 146091
Troponin i, poc: <0.05

## 2011-08-08 LAB — APTT: aPTT: 34

## 2011-08-08 LAB — RETICULOCYTES
RBC.: 2.83 — ABNORMAL LOW
Retic Count, Absolute: 19.8
Retic Ct Pct: 0.7

## 2011-08-08 LAB — TSH: TSH: 0.708

## 2011-08-08 LAB — VITAMIN B12: Vitamin B-12: 379 (ref 211–911)

## 2011-08-08 LAB — URINE MICROSCOPIC-ADD ON

## 2011-08-08 LAB — FOLATE: Folate: 9.6

## 2011-08-17 ENCOUNTER — Telehealth: Payer: Self-pay | Admitting: *Deleted

## 2011-08-17 NOTE — Telephone Encounter (Signed)
Regarding the scooter, pls tell her it is [facility] policy that she make and appt with her PCP )Tonny Branch) to discuss need for a scooter. If she has an acute issue, she can see another provider.  Regarding blood work, Ir can be obtained at next visit (not the scooter assessment visit) and drawn if indicated.  Mental health appt - they were holding off until [facility] determined if they would accept pt. Will need to check in with Lorri Frederick [employer] - she will need an appt to eval X Rays - Dr Tonny Branch can determine at next visit if appropriate.

## 2011-08-17 NOTE — Telephone Encounter (Signed)
I have evaluated her for a scooter previously and at that time I was afraid that she would not get out of it ever to walk and that would lead to a further decline in her physical ability to get around.  She is currently up to date on all of her laboratory studies for the problems she has listed in her problem list and if she is having other problems she will be seen and evaluated for any additional labs.    She NEEDS to get hooked up with psychiatry ASAP no matter what [facility] decides to do for her.  Her main problem stems from her mental health and I have tried multiple medications to help her and have been unable to improve her symptoms.    She has been seen for this previously and has atrophic vaginitis and is supposed to be using premarin cream for this.  She also has a history of BV so if she is having increased drainage she needs to be seen.  She has no clinical need for X-rays.   If she would like to see another provider I have no problems with her having a second opinion and will be happy to see her on any of my clinic days.

## 2011-08-17 NOTE — Telephone Encounter (Signed)
Lorri Frederick, Are you able to facilitate a psych referral, regardless of her [facility] status? Thanks

## 2011-08-17 NOTE — Telephone Encounter (Signed)
Pt calls, wants the following: 1) a script for a scooter so she can talk to several providers of scooters, it is important for her to have a scooter because [place] and she thinks the social aspects of church will improve her mental state and it is impossible for her to walk that far 2) appt w/ someone other than dr Tonny Branch, she thinks a lot of him but would like someone else to eval her problems right now 3) an early am lab appt, with "right smart number of those tubes of blood taken, because someone needs to see what physically is wrong with her" " i may be crazy but i know theres more to it than that" 4) no one has called her about her mental health appt 5) she is itching, burning and bleeding in her "privates" 6) she may need some xrays Please address these issues and i will make an appt for the pt via appt schedulers  Thank you, h.

## 2011-08-22 ENCOUNTER — Telehealth: Payer: Self-pay | Admitting: Licensed Clinical Social Worker

## 2011-08-22 NOTE — Telephone Encounter (Signed)
Spoke w/ [facility] who advised me to fax over facesheet and last two OV notes and they will call Kerri-Anne w/ appmt.

## 2011-08-22 NOTE — Telephone Encounter (Signed)
I will refer to [facility] today. To date I have not heard from [facility] re: their decision after repeated phone calls pleading w/ them to give [place] their decision and that we really needed their help.  I am sorry for the delay on this.

## 2011-08-23 ENCOUNTER — Telehealth: Payer: Self-pay | Admitting: Licensed Clinical Social Worker

## 2011-08-23 NOTE — Telephone Encounter (Signed)
08/23/11  Spoke w/ Marylene Land at [facility] and she said she would call patient today to schedule appmt.  She doesn't know who has referral that was faxed yesterday but I told her that info should be w/ Lupita Leash.

## 2011-08-31 NOTE — Telephone Encounter (Signed)
Left message that [facility] was still working w/ state and w/ son to get patient on the program.

## 2011-09-01 ENCOUNTER — Other Ambulatory Visit: Payer: Self-pay | Admitting: *Deleted

## 2011-09-08 ENCOUNTER — Ambulatory Visit (INDEPENDENT_AMBULATORY_CARE_PROVIDER_SITE_OTHER): Payer: Medicare Other | Admitting: Internal Medicine

## 2011-09-08 ENCOUNTER — Encounter: Payer: Self-pay | Admitting: Internal Medicine

## 2011-09-08 DIAGNOSIS — F329 Major depressive disorder, single episode, unspecified: Secondary | ICD-10-CM

## 2011-09-08 DIAGNOSIS — I10 Essential (primary) hypertension: Secondary | ICD-10-CM

## 2011-09-08 DIAGNOSIS — N952 Postmenopausal atrophic vaginitis: Secondary | ICD-10-CM

## 2011-09-08 DIAGNOSIS — M25559 Pain in unspecified hip: Secondary | ICD-10-CM

## 2011-09-08 NOTE — Patient Instructions (Signed)
We will make the referral for Gynecology for you.  We will also make the Referral to Physical therapy to help you get stronger and be more flexible.    We will also make a referral to go back to Dr. Ophelia Charter.  Continue taking your Iron medication.  We will recheck your iron levels in March to see if we need to continue.  Continue all your other medications as prescribed.

## 2011-09-08 NOTE — Progress Notes (Deleted)
Subjective:   Patient ID: KIELE HEAVRIN female   DOB: [birth year] 70 y.o.   MRN: [id number]  HPI: Ms.Aissatou P Aja is a 70 y.o.     Past Medical History  Diagnosis Date  . History of cervical cancer 1972  . Ovarian cancer 1972    S/P oophorectomy  . GERD (gastroesophageal reflux disease)   . Gout   . Hypertension   . DJD (degenerative joint disease), multiple sites     Low back pain worst  . DDD (degenerative disc disease), lumbosacral   . Osteoarthritis of hip     bilateral hips  . Hx-TIA (transient ischemic attack) 05/13/2001    Right facial numbness  . Sensorineural hearing loss of both ears   . Mitral valve prolapse 10/27/1999    Subsequent 2D echo show normal mitral valve  . Chronic venous insufficiency   . Colon, diverticulosis Sept. 2011    outpatient colonoscopy by Dr. [name].  Need record  . Hyperlipidemia   . Recurrent boils     History of MRSA skin infections with abscess  . Gout     Uric Acid level 4.2 on 300 allopurinol  . Depression    Current Outpatient Prescriptions  Medication Sig Dispense Refill  . allopurinol (ZYLOPRIM) 300 MG tablet Take 1 tablet (300 mg total) by mouth daily.  30 tablet  6  . amLODipine (NORVASC) 5 MG tablet Take 1 tablet (5 mg total) by mouth daily.  31 tablet  6  . Apple Cider Vinegar 300 MG TABS Take 1 tablet by mouth daily.        . Ascorbic Acid (VITAMIN C) 500 MG tablet Take 500 mg by mouth daily.        [name] atorvastatin (LIPITOR) 80 MG tablet Take 1 tablet (80 mg total) by mouth daily.  30 tablet  6  . benzocaine-Menthol (DERMOPLAST) 20-0.5 % AERO Apply 1 application topically 3 (three) times daily as needed. Apply to back as needed for itching and skin irritation       . Biotin (BIOTIN 5000) 5 MG CAPS Take 1 capsule by mouth daily.        . Calcium Carbonate-Vitamin D (CALCIUM-VITAMIN D) 500-200 MG-UNIT per tablet Take 1 tablet by mouth daily.        . cetirizine (ZYRTEC) 10 MG tablet Take 1 tablet (10 mg total) by mouth  daily.  30 tablet  6  . clopidogrel (PLAVIX) 75 MG tablet Take 1 tablet (75 mg total) by mouth daily.  31 tablet  6  . conjugated estrogens (PREMARIN) vaginal cream Place 0.5 Applicatorfuls vaginally daily.  45 g  3  . Elastic Bandages & Supports (T.E.D. KNEE LENGTH/L-REGULAR) MISC 1 Units by Does not apply route daily.        . ferrous sulfate 325 (65 FE) MG tablet Take 1 tablet (325 mg total) by mouth daily.  30 tablet  3  . fluocinonide (LIDEX) 0.05 % cream Apply 1 application topically 2 (two) times daily.  60 g  3  . fluticasone (FLONASE) 50 MCG/ACT nasal spray Place 2 sprays into the nose daily.  16 g  2  . furosemide (LASIX) 20 MG tablet Take 1 tablet (20 mg total) by mouth daily. Okay to use one more tablet daily for increased swelling as needed.  45 tablet  6  . lisinopril (PRINIVIL,ZESTRIL) 40 MG tablet Take 1 tablet (40 mg total) by mouth daily.  31 tablet  6  . nystatin (NYSTOP) 100000 UNIT/GM POWD  Apply 1 g (100,000 Units total) topically 2 (two) times daily. Apply to affected area twice daily  30 g  2  . oxybutynin (DITROPAN-XL) 5 MG 24 hr tablet Take 1 tablet (5 mg total) by mouth at bedtime.  30 tablet  6  . pantoprazole (PROTONIX) 40 MG tablet Take 1 tablet (40 mg total) by mouth daily.  30 tablet  6  . piroxicam (FELDENE) 20 MG capsule Take 20 mg by mouth daily.        . polyethylene glycol (MIRALAX / GLYCOLAX) packet Take 17 g by mouth daily as needed.        . potassium chloride (KLOR-CON) 10 MEQ CR tablet Take 1 tablet (10 mEq total) by mouth daily.  30 tablet  4  . triamcinolone (KENALOG) 0.1 % cream Apply topically 2 (two) times daily.  30 g  5   No family history on file. History   Social History  . Marital Status: Widowed    Spouse Name: N/A    Number of Children: N/A  . Years of Education: N/A   Social History Main Topics  . Smoking status: Former Smoker    Quit date: 10/30/1997  . Smokeless tobacco: Never Used  . Alcohol Use: No  . Drug Use: No  . Sexually  Active: No   Other Topics Concern  . None   Social History Narrative  . None   Review of Systems: Constitutional: Denies fever, chills, diaphoresis, appetite change and fatigue.  HEENT: Denies photophobia, eye pain, redness, hearing loss, ear pain, congestion, sore throat, rhinorrhea, sneezing, mouth sores, trouble swallowing, neck pain, neck stiffness and tinnitus.   Respiratory: Denies SOB, DOE, cough, chest tightness,  and wheezing.   Cardiovascular: Denies chest pain, palpitations and leg swelling.  Gastrointestinal: Denies nausea, vomiting, abdominal pain, diarrhea, constipation, blood in stool and abdominal distention.  Genitourinary: Denies dysuria, urgency, frequency, hematuria, flank pain and difficulty urinating.  Musculoskeletal: Denies myalgias, back pain, joint swelling, arthralgias and gait problem.  Skin: Denies pallor, rash and wound.  Neurological: Denies dizziness, seizures, syncope, weakness, light-headedness, numbness and headaches.  Hematological: Denies adenopathy. Easy bruising, personal or family bleeding history  Psychiatric/Behavioral: Denies suicidal ideation, mood changes, confusion, nervousness, sleep disturbance and agitation  Objective:  Physical Exam: Filed Vitals:   09/08/11 1343  BP: 145/80  Pulse: 72  Temp: 98.6 F (37 C)  TempSrc: Oral  Height: 5' 4.5" (1.638 m)  Weight: 229 lb 8 oz (104.101 kg)   Constitutional: Vital signs reviewed.  Patient is a well-developed and well-nourished *** in no acute distress and cooperative with exam. Alert and oriented x3.  Head: Normocephalic and atraumatic Ear: TM normal bilaterally Mouth: no erythema or exudates, MMM Eyes: PERRL, EOMI, conjunctivae normal, No scleral icterus.  Neck: Supple, Trachea midline normal ROM, No JVD, mass, thyromegaly, or carotid bruit present.  Cardiovascular: RRR, S1 normal, S2 normal, no MRG, pulses symmetric and intact bilaterally Pulmonary/Chest: CTAB, no wheezes, rales, or  rhonchi Abdominal: Soft. Non-tender, non-distended, bowel sounds are normal, no masses, organomegaly, or guarding present.  GU: no CVA tenderness Musculoskeletal: No joint deformities, erythema, or stiffness, ROM full and no nontender Hematology: no cervical, inginal, or axillary adenopathy.  Neurological: A&O x3, Strenght is normal and symmetric bilaterally, cranial nerve II-XII are grossly intact, no focal motor deficit, sensory intact to light touch bilaterally.  Skin: Warm, dry and intact. No rash, cyanosis, or clubbing.  Psychiatric: Normal mood and affect. speech and behavior is normal. Judgment and thought content  normal. Cognition and memory are normal.   Assessment & Plan:

## 2011-09-08 NOTE — Progress Notes (Signed)
Subjective:   [name]   DOB: 04/27/1941 70 y.o.   MRN: [id number]  HPI: [name] is a 70 y.o. woman who presents to clinic today for routine follow up.  She has several complaints today.  She states that she feels like her vaginal itching and bleeding is getting worse.  She has been using the Premarin cream without much change.  She used to see a OB/GYN but he has since retired.  She would like to have a referral to see them again.   She states that she continues to have pain in her hips.  She was told by Dr. Ophelia Charter office that she would need a referral to see him again.  She states that she still has pain in both hips when she walks and that she sometimes is unable to get out of bed.  She brought in another form from a company for power scooters today.    She also states that she has had a problem with constipation lately.  She has been taking her iron pills and states that they constipate her where she is unable to have a bowel movement for several days.  She then has to take Colace for several days and then gets diarrhea after that.    She has been working to get more help at home.  She states that her case worker from social services, [name], has given her an aid for 8 hours per week and she would like to get more.  She also continues to work with a Engineer, civil (consulting) from her church as well as her son to try to get her into the [facility] program.  Past Medical History  Diagnosis Date  . History of cervical cancer 1972  . Ovarian cancer 1972    S/P oophorectomy  . GERD (gastroesophageal reflux disease)   . Gout   . Hypertension   . DJD (degenerative joint disease), multiple sites     Low back pain worst  . DDD (degenerative disc disease), lumbosacral   . Osteoarthritis of hip     bilateral hips  . Hx-TIA (transient ischemic attack) 05/13/2001    Right facial numbness  . Sensorineural hearing loss of both ears   . Mitral valve prolapse 10/27/1999   Subsequent 2D echo show normal mitral valve  . Chronic venous insufficiency   . Colon, diverticulosis Sept. 2011    outpatient colonoscopy by Dr. [name].  Need record  . Hyperlipidemia   . Recurrent boils     History of MRSA skin infections with abscess  . Gout     Uric Acid level 4.2 on 300 allopurinol  . Depression    Current Outpatient Prescriptions  Medication Sig Dispense Refill  . allopurinol (ZYLOPRIM) 300 MG tablet Take 1 tablet (300 mg total) by mouth daily.  30 tablet  6  . amLODipine (NORVASC) 5 MG tablet Take 1 tablet (5 mg total) by mouth daily.  31 tablet  6  . Apple Cider Vinegar 300 MG TABS Take 1 tablet by mouth daily.        . Ascorbic Acid (VITAMIN C) 500 MG tablet Take 500 mg by mouth daily.        [name] atorvastatin (LIPITOR) 80 MG tablet Take 1 tablet (80 mg total) by mouth daily.  30 tablet  6  . benzocaine-Menthol (DERMOPLAST) 20-0.5 % AERO Apply 1 application topically 3 (three) times daily as needed. Apply to back as needed for itching and skin irritation       .  Biotin (BIOTIN 5000) 5 MG CAPS Take 1 capsule by mouth daily.        . Calcium Carbonate-Vitamin D (CALCIUM-VITAMIN D) 500-200 MG-UNIT per tablet Take 1 tablet by mouth daily.        . cetirizine (ZYRTEC) 10 MG tablet Take 1 tablet (10 mg total) by mouth daily.  30 tablet  6  . clopidogrel (PLAVIX) 75 MG tablet Take 1 tablet (75 mg total) by mouth daily.  31 tablet  6  . conjugated estrogens (PREMARIN) vaginal cream Place 0.5 Applicatorfuls vaginally daily.  45 g  3  . Elastic Bandages & Supports (T.E.D. KNEE LENGTH/L-REGULAR) MISC 1 Units by Does not apply route daily.        . ferrous sulfate 325 (65 FE) MG tablet Take 1 tablet (325 mg total) by mouth daily.  30 tablet  3  . fluocinonide (LIDEX) 0.05 % cream Apply 1 application topically 2 (two) times daily.  60 g  3  . fluticasone (FLONASE) 50 MCG/ACT nasal spray Place 2 sprays into the nose daily.  16 g  2  . furosemide (LASIX) 20 MG tablet Take 1  tablet (20 mg total) by mouth daily. Okay to use one more tablet daily for increased swelling as needed.  45 tablet  6  . lisinopril (PRINIVIL,ZESTRIL) 40 MG tablet Take 1 tablet (40 mg total) by mouth daily.  31 tablet  6  . nystatin (NYSTOP) 100000 UNIT/GM POWD Apply 1 g (100,000 Units total) topically 2 (two) times daily. Apply to affected area twice daily  30 g  2  . oxybutynin (DITROPAN-XL) 5 MG 24 hr tablet Take 1 tablet (5 mg total) by mouth at bedtime.  30 tablet  6  . pantoprazole (PROTONIX) 40 MG tablet Take 1 tablet (40 mg total) by mouth daily.  30 tablet  6  . piroxicam (FELDENE) 20 MG capsule Take 20 mg by mouth daily.        . polyethylene glycol (MIRALAX / GLYCOLAX) packet Take 17 g by mouth daily as needed.        . potassium chloride (KLOR-CON) 10 MEQ CR tablet Take 1 tablet (10 mEq total) by mouth daily.  30 tablet  4  . triamcinolone (KENALOG) 0.1 % cream Apply topically 2 (two) times daily.  30 g  5   No family history on file. History   Social History  . Marital Status: Widowed    Spouse Name: N/A    Number of Children: N/A  . Years of Education: N/A   Social History Main Topics  . Smoking status: Former Smoker    Quit date: 10/30/1997  . Smokeless tobacco: Never Used  . Alcohol Use: No  . Drug Use: No  . Sexually Active: No   Other Topics Concern  . None   Social History Narrative  . None   Review of Systems: Constitutional: Denies fever, chills, diaphoresis, appetite change and fatigue.  HEENT: Denies photophobia, eye pain, redness, hearing loss, ear pain, congestion, sore throat, rhinorrhea, sneezing, mouth sores, trouble swallowing, neck pain, neck stiffness and tinnitus.   Respiratory: Denies SOB, DOE, cough, chest tightness,  and wheezing.   Cardiovascular: Denies chest pain, palpitations and leg swelling.  Gastrointestinal: Denies nausea, vomiting, abdominal pain, diarrhea, constipation, blood in stool and abdominal distention.  Genitourinary:  Positive for dysuria. Denies urgency, frequency, hematuria, flank pain and difficulty urinating.  Musculoskeletal: Positive for myalgias, back pain.Denies joint swelling, arthralgias and gait problem.  Skin: Denies pallor, rash and  wound.  Neurological: Denies dizziness, seizures, syncope, weakness, light-headedness, numbness and headaches.  Hematological: Denies adenopathy. Easy bruising, personal or family bleeding history  Psychiatric/Behavioral: Positive for Sleep disturbance Denies suicidal ideation, mood changes, confusion, nervousness, and agitation  Objective:  Physical Exam: Filed Vitals:   09/08/11 1343  BP: 145/80  Pulse: 72  Temp: 98.6 F (37 C)  TempSrc: Oral  Height: 5' 4.5" (1.638 m)  Weight: 229 lb 8 oz (104.101 kg)   Constitutional: Vital signs reviewed.  Patient is a well-developed and well-nourished obese woman in no acute distress and cooperative with exam. Alert and oriented x3.  Head: Normocephalic and atraumatic Ear: TM normal bilaterally Mouth: no erythema or exudates, MMM Eyes: PERRL, EOMI, conjunctivae normal, No scleral icterus.  Neck: Supple, Trachea midline normal ROM, No JVD, mass, thyromegaly, or carotid bruit present.  Cardiovascular: RRR, S1 normal, S2 normal, no MRG, pulses symmetric and intact bilaterally Pulmonary/Chest: CTAB, no wheezes, rales, or rhonchi Abdominal: Soft. Non-tender, non-distended, bowel sounds are normal, no masses, organomegaly, or guarding present.  GU: no CVA tenderness Musculoskeletal: moderately limited ROM in the bilateral hips, knees, and back.  No joint deformities, erythema, or stiffness, Hematology: no cervical, inginal, or axillary adenopathy.  Neurological: A&O x3, Strength is normal and symmetric bilaterally, cranial nerve II-XII are grossly intact, no focal motor deficit, sensory intact to light touch bilaterally.  Skin: Warm, dry and intact. No rash, cyanosis, or clubbing.  Psychiatric: labile mood and flat affect.  speech is rambling and tangential.  Behavior is normal. Judgment and insight are poor. Thought content grossly normal but she continues to voice hyperreligiousity, belief that "the devil is calling me but I just keep praying that God will save me."  Cognition and memory are normal.   Assessment & Plan:

## 2011-09-25 ENCOUNTER — Telehealth: Payer: Self-pay | Admitting: *Deleted

## 2011-09-25 NOTE — Telephone Encounter (Signed)
[name] called me to attempt again to bypass financial counselor.  She went to [facility] office who did assistance incorrectly because pt did not provide the correct information.  She did not deny that.  I was told she also has a Child psychotherapist now that told her she doesn't have to see FC at clinic.  I told her that was wrong as well.  She was informed that she must use the Medicare she has and assume what it will not cover if she intends to be seen. She refuses to go to [facility] because she will not provide the required documentation.

## 2011-09-27 ENCOUNTER — Other Ambulatory Visit: Payer: Self-pay | Admitting: *Deleted

## 2011-09-27 DIAGNOSIS — B3789 Other sites of candidiasis: Secondary | ICD-10-CM

## 2011-09-27 MED ORDER — NYSTATIN 100000 UNIT/GM EX POWD: 1.0000 g | Freq: Two times a day (BID) | CUTANEOUS | Status: DC

## 2011-09-27 NOTE — Telephone Encounter (Signed)
Pt is requesting 2 bottle which is qty# 60.

## 2011-09-28 ENCOUNTER — Ambulatory Visit (HOSPITAL_COMMUNITY): Payer: Medicare Other | Admitting: Physician Assistant

## 2011-09-28 ENCOUNTER — Other Ambulatory Visit: Payer: Self-pay | Admitting: *Deleted

## 2011-09-28 MED ORDER — PIROXICAM 20 MG PO CAPS: 20.0000 mg | ORAL_CAPSULE | Freq: Every day | ORAL | Status: DC

## 2011-10-01 NOTE — Assessment & Plan Note (Signed)
Lab Results  Component Value Date   NA 142 05/26/2011   K 3.9 05/26/2011   CL 108 05/26/2011   CO2 24 05/26/2011   BUN 19 05/26/2011   CREATININE 1.27* 05/26/2011   CREATININE 1.47* 06/30/2010    BP Readings from Last 3 Encounters:  09/08/11 145/80  07/19/11 112/67  07/04/11 111/68    Assessment: Hypertension control:  mildly elevated  Progress toward goals:  deteriorated Barriers to meeting goals:  nonadherence to medications and lack of understanding of disease management  Plan: Hypertension treatment:  continue current medications She has been well controlled in the past but today's numbers are mildly higher.  We will monitor her for now.

## 2011-10-01 NOTE — Assessment & Plan Note (Signed)
She continues to be bothered by the pain in her legs.  I feel that the pain is likely more due to her mood disorder then actual pathology in her legs.  On exam she is very reluctant to relax and let me move her legs.  Gait exam shows good balance with good movement for her legs.  She continues to ask for a power scooter.  I will have her seen and evaluated by Physical therapy for flexibility and strengthening and if she fails PT then we will pursue the power scooter.  I have huge reservations about getting her a power scooter because I'm concerned that once we get her one she will never get out of it and her mobility will only worsen.

## 2011-10-01 NOTE — Assessment & Plan Note (Signed)
She has failed several different medications including Wellbutrin, Prozac, and Celexa.  She also has failed to respond well to Amitriptyline.  We have been working to get her seen at [facility] as well as working to get her set up with the [facility] program.  We will make the schedule for [facility] for now.  I still have concerns about her perception of reality with the delusions and hyperreligiousity that she shows when she is around me.  She continues to deny SI or HI stating "God would never forgive me if I did that."

## 2011-10-01 NOTE — Assessment & Plan Note (Signed)
She continues to be bothered by the symptoms of this.  She would like to see OB/GYN.  We will refer her for their evaluation.  She was advised to continue to use the Premarin cream.

## 2011-10-02 ENCOUNTER — Other Ambulatory Visit: Payer: Self-pay | Admitting: *Deleted

## 2011-10-02 DIAGNOSIS — J309 Allergic rhinitis, unspecified: Secondary | ICD-10-CM

## 2011-10-02 MED ORDER — FLUTICASONE PROPIONATE 50 MCG/ACT NA SUSP: 2.0000 | Freq: Every day | NASAL | Status: DC

## 2011-10-03 ENCOUNTER — Ambulatory Visit: Payer: Medicare Other

## 2011-10-03 NOTE — Progress Notes (Signed)
Addended by: Neomia Dear on: 10/03/2011 05:18 PM   Modules accepted: Orders

## 2011-10-04 ENCOUNTER — Other Ambulatory Visit: Payer: Self-pay | Admitting: Internal Medicine

## 2011-10-04 DIAGNOSIS — M25559 Pain in unspecified hip: Secondary | ICD-10-CM

## 2011-10-05 ENCOUNTER — Ambulatory Visit: Payer: Medicare Other | Admitting: Internal Medicine

## 2011-10-06 ENCOUNTER — Telehealth: Payer: Self-pay | Admitting: *Deleted

## 2011-10-06 NOTE — Telephone Encounter (Signed)
Marlaina has once again created problems.  This time bypassing [facility] and sending [facility] minimal documentation of her income and requesting she be granted free care for the second time.  I received a letter and note that she wrote to [facility] and chose not to scan it into her chart because it was very very nasty toward one employee, which she has been done by her already.  She refuses to see our Artist.  She has been disrespectful to everyone including her PCP.  The patient has Medicare.  I feel she has exhausted her welcome at [facility]. I have personally been dealing with this patients poor behavior for years.  I can share a copy of the letter should anyone care to see it.

## 2011-10-10 NOTE — Progress Notes (Signed)
Addended by: Neomia Dear on: 10/10/2011 09:02 AM   Modules accepted: Orders

## 2011-10-18 ENCOUNTER — Ambulatory Visit: Payer: Medicare Other | Admitting: Obstetrics and Gynecology

## 2011-10-18 ENCOUNTER — Encounter: Payer: Self-pay | Admitting: Obstetrics and Gynecology

## 2011-10-18 ENCOUNTER — Ambulatory Visit (INDEPENDENT_AMBULATORY_CARE_PROVIDER_SITE_OTHER): Payer: Medicare Other | Admitting: Obstetrics and Gynecology

## 2011-10-18 VITALS — BP 144/80 | HR 82 | Temp 99.1°F | Resp 20 | Ht 64.75 in | Wt 226.1 lb

## 2011-10-18 DIAGNOSIS — N952 Postmenopausal atrophic vaginitis: Secondary | ICD-10-CM

## 2011-10-18 LAB — POCT URINALYSIS DIP (DEVICE)
Bilirubin Urine: NEGATIVE
Glucose, UA: NEGATIVE mg/dL
Hgb urine dipstick: NEGATIVE
Ketones, ur: NEGATIVE mg/dL
Nitrite: NEGATIVE
Protein, ur: NEGATIVE mg/dL
Specific Gravity, Urine: 1.02 (ref 1.005–1.030)
Urobilinogen, UA: 0.2 mg/dL (ref 0.0–1.0)
pH: 7 (ref 5.0–8.0)

## 2011-10-18 NOTE — Progress Notes (Signed)
  Subjective:    Patient ID: [name], female    DOB: [birth year], 70 y.o.   MRN: [id number]  HPI  Patient presents today for evaluation of vaginal irritation. Patient states that for the past few months she has experienced increased vulva pruritis and on some occasion has noted some spotting. Patient is not sexually active and has been using premarin cream sporadically. Patient is otherwise without any other complaints. Patient with h/o TAH/BSO years ago.  Review of Systems  All other systems reviewed and are negative.       Objective:   Physical Exam GENERAL: Well-developed, well-nourished female in no acute distress.  ABDOMEN: Soft, nontender, nondistended. No organomegaly. PELVIC: Normal external female genitalia. Vulva normal. Vagina is pale and atrophic. No pelvic mass or tenderness. EXTREMITIES: No cyanosis, clubbing, or edema, 2+ distal pulses.     Assessment & Plan:  70 yo with with vaginal atrophy -perineal care reviewed - Patient instructed to continue the use of premarin cream at bedtime - Follow-up with PCP as scheduled - RTC prn

## 2011-10-18 NOTE — Progress Notes (Signed)
Pt reports having vaginal d/c with odor and itching x several months.  She believes the problem began when she was started on meds for depression and anxiety.

## 2011-10-18 NOTE — Telephone Encounter (Signed)
I have been told previously that pt has untreated mental illness and has been reluctant to get treatment. As one last effort before dismissal, we will referral to [facility]. Lorri Frederick, would you please assist in making this referral? Thanks

## 2011-10-19 ENCOUNTER — Encounter: Payer: Medicare Other | Admitting: Advanced Practice Midwife

## 2011-10-20 ENCOUNTER — Telehealth: Payer: Self-pay | Admitting: *Deleted

## 2011-10-20 NOTE — Telephone Encounter (Signed)
[facility] received a call from [place] because she wanted the name of the scooter vendor from her visit with [name].  I spoke with [name], and it is documented in his note, that he does not feel it is in [name]'s best interest to use a scooter.  That was what I communicated.

## 2011-11-08 ENCOUNTER — Telehealth: Payer: Self-pay | Admitting: *Deleted

## 2011-11-16 NOTE — Telephone Encounter (Signed)
Opened as reference

## 2011-11-23 NOTE — Progress Notes (Signed)
Addended by: Dorie Rank E on: 11/23/2011 11:54 AM   Modules accepted: Orders

## 2011-11-29 ENCOUNTER — Encounter (HOSPITAL_COMMUNITY): Payer: Self-pay | Admitting: Psychiatry

## 2011-11-29 ENCOUNTER — Ambulatory Visit (INDEPENDENT_AMBULATORY_CARE_PROVIDER_SITE_OTHER): Payer: Medicare Other | Admitting: Psychiatry

## 2011-11-29 VITALS — BP 103/67 | HR 77 | Ht 64.25 in | Wt 226.0 lb

## 2011-11-29 DIAGNOSIS — F29 Unspecified psychosis not due to a substance or known physiological condition: Secondary | ICD-10-CM

## 2011-11-29 MED ORDER — HALOPERIDOL 0.5 MG PO TABS: 0.5000 mg | ORAL_TABLET | Freq: Every day | ORAL | Status: DC
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
## 2011-11-30 ENCOUNTER — Telehealth: Payer: Self-pay | Admitting: *Deleted

## 2011-11-30 MED ORDER — POTASSIUM CHLORIDE ER 10 MEQ PO TBCR: 10.0000 meq | EXTENDED_RELEASE_TABLET | Freq: Two times a day (BID) | ORAL | Status: DC

## 2011-11-30 MED ORDER — POTASSIUM CHLORIDE ER 10 MEQ PO TBCR: 10.0000 meq | EXTENDED_RELEASE_TABLET | Freq: Every day | ORAL | Status: DC

## 2011-11-30 NOTE — Telephone Encounter (Signed)
Requesting refill on Potassium Chloride - Take 1 tab by mout everyday  Qty #30 Thanks

## 2011-11-30 NOTE — Telephone Encounter (Signed)
PPA called to question script for K+, script is 1 twice daily #30... Had been 1 daily.... Spoke w/ dr Tonny Branch, should be 1 tab daily , called PPA w/ answer

## 2011-11-30 NOTE — Telephone Encounter (Signed)
Done thanks Glenda!

## 2011-11-30 NOTE — Telephone Encounter (Signed)
Proper dose entered.  Thanks!

## 2011-12-08 ENCOUNTER — Encounter: Payer: Self-pay | Admitting: Internal Medicine

## 2011-12-08 ENCOUNTER — Ambulatory Visit (INDEPENDENT_AMBULATORY_CARE_PROVIDER_SITE_OTHER): Payer: Medicare Other | Admitting: Internal Medicine

## 2011-12-08 VITALS — BP 150/84 | HR 77 | Temp 98.2°F | Wt 229.2 lb

## 2011-12-08 DIAGNOSIS — M25559 Pain in unspecified hip: Secondary | ICD-10-CM

## 2011-12-08 DIAGNOSIS — G8929 Other chronic pain: Secondary | ICD-10-CM

## 2011-12-08 DIAGNOSIS — N952 Postmenopausal atrophic vaginitis: Secondary | ICD-10-CM

## 2011-12-08 DIAGNOSIS — D638 Anemia in other chronic diseases classified elsewhere: Secondary | ICD-10-CM

## 2011-12-08 DIAGNOSIS — I10 Essential (primary) hypertension: Secondary | ICD-10-CM

## 2011-12-08 DIAGNOSIS — E785 Hyperlipidemia, unspecified: Secondary | ICD-10-CM

## 2011-12-08 DIAGNOSIS — F22 Delusional disorders: Secondary | ICD-10-CM

## 2011-12-08 MED ORDER — ESTROGENS, CONJUGATED 0.625 MG/GM VA CREA: TOPICAL_CREAM | Freq: Every day | VAGINAL | Status: DC

## 2011-12-08 NOTE — Progress Notes (Signed)
Subjective:   [name]   DOB: [birth year] 71 y.o.   MRN: [id number]  HPI: [name] is a 71 y.o. woman who presents today for follow up from her last appointment.  She saw [name] at [facility] in the end of January and was started on Haldol to help her with her psychiatric symptoms.  She states that the medication makes her tired so she has only been taking it every other day but that she is working on taking it every day.  She states that she is less irritable and is sleeping mildly better at night.   She states that her hips are still bothering her.  She continues to have bilateral hip pain when she tries to stand for a long period.  She asks again for a power scooter today.  She has been seen by the neurorehab and they have done the mobility assessment and will be getting her a power scooter soon.  She is excited for this and states that she promises that she will still get out of the chair and walk often.    She also saw OB/GYN and they agreed that her vaginal discomfort was secondary to postmenopausal atrophic vaginitis.  They adjusted her Premarin cream by increasing the dose and also changing it to bedtime application.  She states that when she switched to this within 3 days her discomfort as gone.    She is due for several follow up labs today including her lipid profile as well as a recheck of her ferritin and iron.  She states that she has been taking the iron but does not like it and would like to get off of it.  She denies constipation, black tarry stools, or abdominal pain.    Greater then 60 minutes were spent discussing her multiple medical conditions and reviewing the records of several specialist visits with greater then 50% in face to face counseling with the patient.  Past Medical History  Diagnosis Date  . History of cervical cancer 1972  . Ovarian cancer 1972    S/P oophorectomy  . GERD (gastroesophageal reflux disease)   . Gout   .  Hypertension   . DJD (degenerative joint disease), multiple sites     Low back pain worst  . DDD (degenerative disc disease), lumbosacral   . Osteoarthritis of hip     bilateral hips  . Hx-TIA (transient ischemic attack) 05/13/2001    Right facial numbness  . Sensorineural hearing loss of both ears   . Mitral valve prolapse 10/27/1999    Subsequent 2D echo show normal mitral valve  . Chronic venous insufficiency   . Colon, diverticulosis Sept. 2011    outpatient colonoscopy by Dr. [name].  Need record  . Hyperlipidemia   . Recurrent boils     History of MRSA skin infections with abscess  . Gout     Uric Acid level 4.2 on 300 allopurinol  . Depression    Current Outpatient Prescriptions  Medication Sig Dispense Refill  . allopurinol (ZYLOPRIM) 300 MG tablet Take 1 tablet (300 mg total) by mouth daily.  30 tablet  6  . amLODipine (NORVASC) 5 MG tablet Take 1 tablet (5 mg total) by mouth daily.  31 tablet  6  . Apple Cider Vinegar 300 MG TABS Take 1 tablet by mouth daily.        . Ascorbic Acid (VITAMIN C) 500 MG tablet Take 500 mg by mouth daily.        [name]  atorvastatin (LIPITOR) 80 MG tablet Take 1 tablet (80 mg total) by mouth daily.  30 tablet  6  . benzocaine-Menthol (DERMOPLAST) 20-0.5 % AERO Apply 1 application topically 3 (three) times daily as needed. Apply to back as needed for itching and skin irritation       . Biotin (BIOTIN 5000) 5 MG CAPS Take 1 capsule by mouth daily.        . Calcium Carbonate-Vitamin D (CALCIUM-VITAMIN D) 500-200 MG-UNIT per tablet Take 1 tablet by mouth daily.        . cetirizine (ZYRTEC) 10 MG tablet Take 1 tablet (10 mg total) by mouth daily.  30 tablet  6  . clopidogrel (PLAVIX) 75 MG tablet Take 1 tablet (75 mg total) by mouth daily.  31 tablet  6  . conjugated estrogens (PREMARIN) vaginal cream Place 0.5 Applicatorfuls vaginally daily.  45 g  3  . Elastic Bandages & Supports (T.E.D. KNEE LENGTH/L-REGULAR) MISC 1 Units by Does not apply route  daily.        . ferrous sulfate 325 (65 FE) MG tablet Take 1 tablet (325 mg total) by mouth daily.  30 tablet  3  . fluocinonide (LIDEX) 0.05 % cream Apply 1 application topically 2 (two) times daily.  60 g  3  . fluticasone (FLONASE) 50 MCG/ACT nasal spray Place 2 sprays into the nose daily.  16 g  6  . furosemide (LASIX) 20 MG tablet Take 1 tablet (20 mg total) by mouth daily. Okay to use one more tablet daily for increased swelling as needed.  45 tablet  6  . haloperidol (HALDOL) 0.5 MG tablet Take 1 tablet (0.5 mg total) by mouth at bedtime.  30 tablet  0  . lisinopril (PRINIVIL,ZESTRIL) 40 MG tablet Take 1 tablet (40 mg total) by mouth daily.  31 tablet  6  . nystatin (NYSTOP) 100000 UNIT/GM POWD Apply 1 g (100,000 Units total) topically 2 (two) times daily. Apply to affected area twice daily  60 g  6  . oxybutynin (DITROPAN-XL) 5 MG 24 hr tablet Take 1 tablet (5 mg total) by mouth at bedtime.  30 tablet  6  . pantoprazole (PROTONIX) 40 MG tablet Take 1 tablet (40 mg total) by mouth daily.  30 tablet  6  . piroxicam (FELDENE) 20 MG capsule Take 1 capsule (20 mg total) by mouth daily.  90 capsule  0  . polyethylene glycol (MIRALAX / GLYCOLAX) packet Take 17 g by mouth daily as needed.        . potassium chloride (KLOR-CON 10) 10 MEQ tablet Take 1 tablet (10 mEq total) by mouth daily.  30 tablet  6  . triamcinolone (KENALOG) 0.1 % cream Apply topically 2 (two) times daily.  30 g  5   Family History  Problem Relation Age of Onset  . Diabetes Mother   . Heart disease Mother   . Hearing loss Mother   . Stroke Mother    History   Social History  . Marital Status: Widowed    Spouse Name: N/A    Number of Children: N/A  . Years of Education: N/A   Social History Main Topics  . Smoking status: Former Smoker    Quit date: 10/30/1997  . Smokeless tobacco: Never Used  . Alcohol Use: No  . Drug Use: No  . Sexually Active: No   Other Topics Concern  . None   Social History Narrative    . None   Review of Systems:  Constitutional: Denies fever, chills, diaphoresis, appetite change and fatigue.  HEENT: Denies photophobia, eye pain, redness, hearing loss, ear pain, congestion, sore throat, rhinorrhea, sneezing, mouth sores, trouble swallowing, neck pain, neck stiffness and tinnitus.   Respiratory: Denies SOB, DOE, cough, chest tightness,  and wheezing.   Cardiovascular: Denies chest pain, palpitations and leg swelling.  Gastrointestinal: Denies nausea, vomiting, abdominal pain, diarrhea, constipation, blood in stool and abdominal distention.  Genitourinary: Denies dysuria, urgency, frequency, hematuria, flank pain and difficulty urinating.  Musculoskeletal: Denies myalgias, back pain, joint swelling, arthralgias and gait problem.  Skin: Denies pallor, rash and wound.  Neurological: Denies dizziness, seizures, syncope, weakness, light-headedness, numbness and headaches.  Hematological: Denies adenopathy. Easy bruising, personal or family bleeding history  Psychiatric/Behavioral: Denies suicidal ideation, mood changes, confusion, nervousness, sleep disturbance and agitation  Objective:  Physical Exam: Filed Vitals:   12/08/11 1614  BP: 150/84  Pulse: 77  Temp: 98.2 F (36.8 C)  TempSrc: Oral  Weight: 229 lb 3.2 oz (103.964 kg)   Constitutional: Vital signs reviewed.  Patient is a well-developed and well-nourished obese, elderly woman in no acute distress and cooperative with exam. Alert and oriented x3.  Head: Normocephalic and atraumatic Ear: TM normal bilaterally Mouth: no erythema or exudates, MMM Eyes: PERRL, EOMI, conjunctivae normal, No scleral icterus.  Neck: Supple, Trachea midline normal ROM, No JVD, mass, thyromegaly, or carotid bruit present.  Cardiovascular: RRR, S1 normal, S2 normal, no MRG, pulses symmetric and intact bilaterally Pulmonary/Chest: CTAB, no wheezes, rales, or rhonchi Abdominal: Soft. Non-tender, non-distended, bowel sounds are normal, no  masses, organomegaly, or guarding present.  GU: no CVA tenderness Musculoskeletal: There is mild tenderness to palpation in the bilateral greater trochanters.  There is moderate limited ROM in the hips bilaterally secondary to guarding.  No joint deformities, erythema, or stiffness, ROM full and no nontender Hematology: no cervical, inginal, or axillary adenopathy.  Neurological: A&O x3, Strength is normal and symmetric bilaterally, cranial nerve II-XII are grossly intact, no focal motor deficit, sensory intact to light touch bilaterally.  Skin: Warm, dry and intact. No rash, cyanosis, or clubbing.  Psychiatric: Normal mood and labile affect. speech is tangential and mildly pressured.  Behavior is mildly bizarre.  Judgment and insight is poor.  Thought content normal. Cognition and memory are normal.  Attention is poor.   Assessment & Plan:

## 2011-12-08 NOTE — Patient Instructions (Addendum)
1.  Continue taking your medications as prescribed.  2.  Keep your follow up appointments with your doctors.  3.  Stop in the lab to have your blood drawn and if we need to follow up on anything I will call you.  4.  Follow up in 3 months.

## 2011-12-09 LAB — FERRITIN: Ferritin: 56 ng/mL (ref 10–291)

## 2011-12-09 LAB — IRON AND TIBC
%SAT: 8 % — ABNORMAL LOW (ref 20–55)
Iron: 20 ug/dL — ABNORMAL LOW (ref 42–145)
TIBC: 248 ug/dL — ABNORMAL LOW (ref 250–470)
UIBC: 228 ug/dL (ref 125–400)

## 2011-12-09 LAB — LIPID PANEL
Cholesterol: 147 mg/dL (ref 0–200)
HDL: 36 mg/dL — ABNORMAL LOW (ref >39)
LDL Cholesterol: 84 mg/dL (ref 0–99)
Total CHOL/HDL Ratio: 4.1 Ratio
Triglycerides: 133 mg/dL (ref <150)
VLDL: 27 mg/dL (ref 0–40)

## 2011-12-11 ENCOUNTER — Ambulatory Visit: Payer: Medicare Other | Attending: Internal Medicine | Admitting: Physical Therapy

## 2011-12-11 DIAGNOSIS — R262 Difficulty in walking, not elsewhere classified: Secondary | ICD-10-CM | POA: Insufficient documentation

## 2011-12-11 DIAGNOSIS — IMO0001 Reserved for inherently not codable concepts without codable children: Secondary | ICD-10-CM | POA: Insufficient documentation

## 2011-12-15 ENCOUNTER — Telehealth: Payer: Self-pay | Admitting: *Deleted

## 2011-12-15 DIAGNOSIS — N952 Postmenopausal atrophic vaginitis: Secondary | ICD-10-CM

## 2011-12-15 MED ORDER — ESTROGENS, CONJUGATED 0.625 MG/GM VA CREA: TOPICAL_CREAM | Freq: Every day | VAGINAL | Status: DC

## 2011-12-15 NOTE — Telephone Encounter (Signed)
Pharm calls and states premarin cream does not come in 45 gram tube any more, could they give her two 30 gram tubes a month?, if so will you send a new script electronically?  Thanks, h.

## 2011-12-15 NOTE — Telephone Encounter (Signed)
Sent!

## 2011-12-21 ENCOUNTER — Ambulatory Visit (INDEPENDENT_AMBULATORY_CARE_PROVIDER_SITE_OTHER): Payer: Medicare Other | Admitting: Psychology

## 2011-12-21 DIAGNOSIS — F309 Manic episode, unspecified: Secondary | ICD-10-CM
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
## 2011-12-26 ENCOUNTER — Other Ambulatory Visit (HOSPITAL_COMMUNITY): Payer: Self-pay | Admitting: Psychology

## 2011-12-26 DIAGNOSIS — F29 Unspecified psychosis not due to a substance or known physiological condition: Secondary | ICD-10-CM

## 2011-12-26 MED ORDER — HALOPERIDOL 0.5 MG PO TABS: 0.5000 mg | ORAL_TABLET | Freq: Every day | ORAL | Status: DC

## 2011-12-27 ENCOUNTER — Ambulatory Visit (INDEPENDENT_AMBULATORY_CARE_PROVIDER_SITE_OTHER): Payer: Medicare Other | Admitting: Psychiatry

## 2011-12-27 ENCOUNTER — Encounter (HOSPITAL_COMMUNITY): Payer: Self-pay | Admitting: Psychiatry

## 2011-12-27 VITALS — BP 140/82 | HR 68 | Wt 225.0 lb

## 2011-12-27 DIAGNOSIS — F29 Unspecified psychosis not due to a substance or known physiological condition: Secondary | ICD-10-CM

## 2011-12-27 MED ORDER — HALOPERIDOL 1 MG PO TABS: 1.0000 mg | ORAL_TABLET | Freq: Every day | ORAL | Status: DC
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
## 2012-01-05 ENCOUNTER — Other Ambulatory Visit: Payer: Self-pay | Admitting: *Deleted

## 2012-01-05 MED ORDER — POLYETHYLENE GLYCOL 3350 17 G PO PACK: 17.0000 g | PACK | Freq: Every day | ORAL | Status: DC | PRN

## 2012-01-09 ENCOUNTER — Telehealth: Payer: Self-pay | Admitting: *Deleted

## 2012-01-09 NOTE — Telephone Encounter (Signed)
Pt called and wants to know if she can stop taking the iron pills. She states she had labs done at last visit.  Pt # [id number]

## 2012-01-10 NOTE — Telephone Encounter (Signed)
Pt called 2X's today and no answer.  Message left for her to call clinic about medication.

## 2012-01-10 NOTE — Telephone Encounter (Signed)
Her iron at her last visit was about the same as the last time we checked.  She was still anemic at her last check in September but it is more likely secondary to her CKD.   Please call her and let her know that if she is having problems with the iron that she can stop it and we will recheck her hemoglobin and her iron studies in a few months to see how she is doing.  If she is not having side effects like constipation, etc. Then she should continue it for now.

## 2012-01-16 ENCOUNTER — Ambulatory Visit (HOSPITAL_COMMUNITY): Payer: Medicare Other | Admitting: Psychology

## 2012-01-17 ENCOUNTER — Ambulatory Visit (HOSPITAL_COMMUNITY): Payer: Self-pay | Admitting: Psychiatry

## 2012-01-18 ENCOUNTER — Ambulatory Visit (INDEPENDENT_AMBULATORY_CARE_PROVIDER_SITE_OTHER): Payer: Medicare Other | Admitting: Psychology

## 2012-01-18 DIAGNOSIS — F39 Unspecified mood [affective] disorder: Secondary | ICD-10-CM
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
## 2012-01-20 DIAGNOSIS — F209 Schizophrenia, unspecified: Secondary | ICD-10-CM | POA: Insufficient documentation

## 2012-01-20 NOTE — Assessment & Plan Note (Signed)
Lab Results  Component Value Date   IRON 20* 12/08/2011   TIBC 248* 12/08/2011   FERRITIN 56 12/08/2011   CBC:    Component Value Date/Time   WBC 7.1 07/19/2011 1221   HGB 11.2* 07/19/2011 1221   HCT 34.6* 07/19/2011 1221   PLT 262 07/19/2011 1221   MCV 96.1 07/19/2011 1221   NEUTROABS 3.7 01/27/2011 1631   LYMPHSABS 2.3 01/27/2011 1631   MONOABS 0.3 01/27/2011 1631   EOSABS 0.3 01/27/2011 1631   BASOSABS 0.0 01/27/2011 1631   Her ferritin is good but her iron continues to be low.  Her MCV is normal so she likely has Anemia of Chronic inflammation or secondary to CKD.   If she continues to have problems we will have her stop her medication and follow her CBC and iron studies in 6 months to see how she is doing.

## 2012-01-20 NOTE — Assessment & Plan Note (Signed)
Lab Results  Component Value Date   CHOL 147 12/08/2011   CHOL  Value: 154        ATP III CLASSIFICATION:  <200     mg/dL   Desirable  161-096  mg/dL   Borderline High  >=045    mg/dL   High        4/0/9811   CHOL 199 02/08/2010   Lab Results  Component Value Date   HDL 36* 12/08/2011   HDL 36* 06/30/2010   HDL 48 07/13/7828   Lab Results  Component Value Date   LDLCALC 84 12/08/2011   LDLCALC  Value: 87        Total Cholesterol/HDL:CHD Risk Coronary Heart Disease Risk Table                     Men   Women  1/2 Average Risk   3.4   3.3  Average Risk       5.0   4.4  2 X Average Risk   9.6   7.1  3 X Average Risk  23.4   11.0        Use the calculated Patient Ratio above and the CHD Risk Table to determine the patient's CHD Risk.        ATP III CLASSIFICATION (LDL):  <100     mg/dL   Optimal  562-130  mg/dL   Near or Above                    Optimal  130-159  mg/dL   Borderline  865-784  mg/dL   High  >696     mg/dL   Very High 12/08/5282   LDLCALC 116* 02/08/2010   Lab Results  Component Value Date   TRIG 133 12/08/2011   TRIG 155* 06/30/2010   TRIG 175* 02/08/2010   Lab Results  Component Value Date   CHOLHDL 4.1 12/08/2011   CHOLHDL 4.3 06/30/2010   CHOLHDL 4.1 Ratio 02/08/2010   No results found for this basename: LDLDIRECT   Her LDL is below her goal of <100 with her history of TIA.  We will continue to monitor on a yearly basis.

## 2012-01-20 NOTE — Assessment & Plan Note (Signed)
Lab Results  Component Value Date   NA 142 05/26/2011   K 3.9 05/26/2011   CL 108 05/26/2011   CO2 24 05/26/2011   BUN 19 05/26/2011   CREATININE 1.27* 05/26/2011   CREATININE 1.47* 06/30/2010    BP Readings from Last 3 Encounters:  12/27/11 140/82  12/08/11 150/84  11/29/11 103/67    Assessment: Hypertension control:  controlled  Progress toward goals:  improved Barriers to meeting goals:  no barriers identified  Plan: Hypertension treatment:  continue current medications

## 2012-01-20 NOTE — Assessment & Plan Note (Signed)
She is being followed by Dr. Olin Pia for this at [facility].  I greatly appreciate their help with [name].  I encouraged her to continue taking her medication as prescribed and encouraged her to call if she has any problems with the medication.

## 2012-01-20 NOTE — Assessment & Plan Note (Signed)
She continues to have bilateral hip pain that limits her mobility.  On exam today she is unable to relax for me to perform a good exam.  She has also been dismissed from PT because of missed appointments.  She is getting her power scooter to help her get around so we'll have to follow and see how she does.

## 2012-01-20 NOTE — Assessment & Plan Note (Signed)
We refilled her premarin cream today.  She is currently symptom free.

## 2012-01-24 ENCOUNTER — Encounter (HOSPITAL_COMMUNITY): Payer: Self-pay | Admitting: Psychiatry

## 2012-01-24 ENCOUNTER — Ambulatory Visit (INDEPENDENT_AMBULATORY_CARE_PROVIDER_SITE_OTHER): Payer: Medicare Other | Admitting: Psychiatry

## 2012-01-24 VITALS — Wt 220.0 lb

## 2012-01-24 DIAGNOSIS — F29 Unspecified psychosis not due to a substance or known physiological condition: Secondary | ICD-10-CM

## 2012-01-24 MED ORDER — HALOPERIDOL 0.5 MG PO TABS: ORAL_TABLET | ORAL | Status: DC
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
## 2012-01-30 ENCOUNTER — Other Ambulatory Visit: Payer: Self-pay | Admitting: *Deleted

## 2012-01-30 DIAGNOSIS — L259 Unspecified contact dermatitis, unspecified cause: Secondary | ICD-10-CM

## 2012-01-30 DIAGNOSIS — I10 Essential (primary) hypertension: Secondary | ICD-10-CM

## 2012-01-30 MED ORDER — FLUOCINONIDE 0.05 % EX CREA: 1.0000 "application " | TOPICAL_CREAM | Freq: Two times a day (BID) | CUTANEOUS | Status: DC

## 2012-01-30 MED ORDER — PANTOPRAZOLE SODIUM 40 MG PO TBEC: 40.0000 mg | DELAYED_RELEASE_TABLET | Freq: Every day | ORAL | Status: DC

## 2012-01-30 MED ORDER — FUROSEMIDE 20 MG PO TABS: 20.0000 mg | ORAL_TABLET | Freq: Every day | ORAL | Status: DC

## 2012-02-13 ENCOUNTER — Ambulatory Visit (INDEPENDENT_AMBULATORY_CARE_PROVIDER_SITE_OTHER): Payer: Medicare Other | Admitting: Psychology

## 2012-02-13 DIAGNOSIS — F3111 Bipolar disorder, current episode manic without psychotic features, mild: Secondary | ICD-10-CM

## 2012-02-13 NOTE — Progress Notes (Signed)
   THERAPIST PROGRESS NOTE  Session Time: 1320 - 1400  Participation Level: Active  Behavioral Response: Well GroomedAlertEuphoric  Type of Therapy: Individual Therapy  Treatment Goals addressed: Coping  Interventions: CBT and Other: beginning termination  Summary: [name] is a 71 y.o. female who presents with newly colored hair (very modest and attractive), fresh manicure and positive report of her life.  [name] has her usual pressured speech and circumstantiality, but can be redirected to the present for brief periods.  She reports that some new medications are helping her to feel better (not sure what these are, however).  She also reports that when she has to be out of the house during the day, she delays taking her morning meds until she gets home, so she will not embarrass herself. She says that her money issues have now been cleared up and she had a bit extra, so decided to treat herself.  Reported that she has a new aide who comes in 3 times a week, someone whose family she has known.  She needs someone to take her to all appointments and gets this help often, from church members.  She has not heard yet about a mobilized chair coming for her. I sense that what I am seeing today and the last visit, is very much her baseline.  She is not blatantly manic nor is she psychotic today.  She is complaining about a lot of arthritis pain which is expected given our current weather.  She was also pleased that she has lost 15 lbs., relating this to lack of appetite as she is taking "African Mango Juice" as a dietary supplement.    Suicidal/Homicidal: Nowithout intent/plan  Therapist Response: Clarified her current status by interrupting her stories.  Identified the difficulty she has coming to appointments. Suggested she return as she sees the need, rather than make an appointment today.  She agreed -  Saying "I really am doing well".   Plan: Return again in as needed.  She knows of my  retirement in July, 2013.  Diagnosis: Axis I: Bipolar, hypomanic, stable    Axis II: No diagnosis    Shelby Salce, RN 02/13/2012

## 2012-02-26 ENCOUNTER — Other Ambulatory Visit: Payer: Self-pay | Admitting: *Deleted

## 2012-02-26 DIAGNOSIS — B3789 Other sites of candidiasis: Secondary | ICD-10-CM

## 2012-02-26 MED ORDER — CLOTRIMAZOLE 1 % VA CREA: 1.0000 | TOPICAL_CREAM | Freq: Two times a day (BID) | VAGINAL | Status: DC

## 2012-02-26 MED ORDER — PIROXICAM 20 MG PO CAPS: 20.0000 mg | ORAL_CAPSULE | Freq: Every day | ORAL | Status: DC

## 2012-02-26 NOTE — Telephone Encounter (Signed)
Also requesting refill on - Clotrimazole 1% vaginal cream - Place 1 applicatorful vaginally 2 times daily Qty #45.  Thanks

## 2012-02-28 ENCOUNTER — Encounter (HOSPITAL_COMMUNITY): Payer: Self-pay | Admitting: Psychiatry

## 2012-02-28 ENCOUNTER — Ambulatory Visit (INDEPENDENT_AMBULATORY_CARE_PROVIDER_SITE_OTHER): Payer: Medicare Other | Admitting: Psychiatry

## 2012-02-28 VITALS — BP 128/74 | HR 80 | Wt 219.8 lb

## 2012-02-28 DIAGNOSIS — F29 Unspecified psychosis not due to a substance or known physiological condition: Secondary | ICD-10-CM

## 2012-02-28 MED ORDER — BENZTROPINE MESYLATE 0.5 MG PO TABS: 0.5000 mg | ORAL_TABLET | Freq: Every day | ORAL | Status: DC

## 2012-02-28 MED ORDER — HALOPERIDOL 2 MG PO TABS: 2.0000 mg | ORAL_TABLET | Freq: Every day | ORAL | Status: DC
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
## 2012-03-04 ENCOUNTER — Telehealth (HOSPITAL_COMMUNITY): Payer: Self-pay | Admitting: *Deleted

## 2012-03-04 NOTE — Telephone Encounter (Signed)
Very distressed and  concerned after pharmacy delivered Cogentin and it was labeled for Parkinson's.Said she asked for something for 'sore muscles'not Parkinsons.Explained med rationale to pt based on Dr.Arfeen's note.Verbalized understanding.

## 2012-03-18 ENCOUNTER — Other Ambulatory Visit: Payer: Self-pay | Admitting: Family Medicine

## 2012-03-18 ENCOUNTER — Other Ambulatory Visit: Payer: Self-pay | Admitting: Internal Medicine

## 2012-03-18 DIAGNOSIS — Z1231 Encounter for screening mammogram for malignant neoplasm of breast: Secondary | ICD-10-CM

## 2012-03-19 ENCOUNTER — Telehealth (HOSPITAL_COMMUNITY): Payer: Self-pay | Admitting: *Deleted

## 2012-03-19 NOTE — Telephone Encounter (Signed)
Left WU:[id number] 2mg  is too strong.Pt states "it's killing me". States sleeps 16 hours when she takes it daily, and hard to wake up. Tried taking it every other day, still too groggy. Wants to know if MD can change it and call in 1mg  strength?

## 2012-03-20 ENCOUNTER — Telehealth (HOSPITAL_COMMUNITY): Payer: Self-pay | Admitting: *Deleted

## 2012-03-20 DIAGNOSIS — F29 Unspecified psychosis not due to a substance or known physiological condition: Secondary | ICD-10-CM

## 2012-03-20 MED ORDER — HALOPERIDOL 1 MG PO TABS: 1.0000 mg | ORAL_TABLET | Freq: Every day | ORAL | Status: DC

## 2012-03-20 NOTE — Telephone Encounter (Signed)
Contacted pt per Dr.Arfeen's instructions:Can decrease Haldol from 2 mg daily to 1 mg daily. Verbalized understanding of instructions. Pt states pill has lines to cut in half, but that she does not have a pill splitter.States she will try to cut with knife. Requested Haldol 1 mg be sent to pharmacy so that when this RX runs out at end of month, the pharmacy will send her the 1 mg tablet.

## 2012-03-20 NOTE — Telephone Encounter (Addendum)
Notified per [facility] that RX for Haldol 1 mg po q HS did not go thru to [facility]-instructed to call in RX. Call completed

## 2012-04-01 ENCOUNTER — Other Ambulatory Visit: Payer: Self-pay | Admitting: *Deleted

## 2012-04-01 MED ORDER — AMLODIPINE BESYLATE 5 MG PO TABS: 5.0000 mg | ORAL_TABLET | Freq: Every day | ORAL | Status: DC

## 2012-04-01 MED ORDER — LISINOPRIL 40 MG PO TABS: 40.0000 mg | ORAL_TABLET | Freq: Every day | ORAL | Status: DC

## 2012-04-10 ENCOUNTER — Ambulatory Visit (HOSPITAL_COMMUNITY): Payer: Self-pay | Admitting: Psychiatry

## 2012-04-15 ENCOUNTER — Telehealth (HOSPITAL_COMMUNITY): Payer: Self-pay | Admitting: *Deleted

## 2012-04-15 ENCOUNTER — Other Ambulatory Visit (HOSPITAL_COMMUNITY): Payer: Self-pay | Admitting: Psychiatry
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
## 2012-04-16 NOTE — Telephone Encounter (Signed)
Spoke to patient who had stopped taking her medication.  She does not want to take antipsychotic medication.  She wants to discuss with her primary care physician and is scheduled to see in August.  She will like to make appointment once she discussed with her primary care physician.  I recommend to call us if she has any question or need early appointment.

## 2012-04-18 ENCOUNTER — Telehealth (HOSPITAL_COMMUNITY): Payer: Self-pay

## 2012-04-18 NOTE — Telephone Encounter (Signed)
10:55am 04/18/12 s/w pt and she stated to cancel the appt - no r/s at this time./sh

## 2012-04-22 ENCOUNTER — Ambulatory Visit (HOSPITAL_COMMUNITY): Payer: Self-pay | Admitting: Psychiatry

## 2012-04-29 ENCOUNTER — Ambulatory Visit
Admission: RE | Admit: 2012-04-29 | Discharge: 2012-04-29 | Disposition: A | Discharge status: Home / Self Care | Payer: Medicare Other | Source: Ambulatory Visit | Attending: Family Medicine | Admitting: Family Medicine

## 2012-04-29 DIAGNOSIS — Z1231 Encounter for screening mammogram for malignant neoplasm of breast: Secondary | ICD-10-CM

## 2012-04-30 ENCOUNTER — Other Ambulatory Visit: Payer: Self-pay | Admitting: *Deleted

## 2012-04-30 DIAGNOSIS — B3789 Other sites of candidiasis: Secondary | ICD-10-CM

## 2012-05-01 MED ORDER — AMLODIPINE BESYLATE 5 MG PO TABS: 5.0000 mg | ORAL_TABLET | Freq: Every day | ORAL | Status: DC

## 2012-05-01 MED ORDER — NYSTATIN 100000 UNIT/GM EX POWD: 1.0000 g | Freq: Two times a day (BID) | CUTANEOUS | Status: DC

## 2012-05-01 MED ORDER — CLOPIDOGREL BISULFATE 75 MG PO TABS: 75.0000 mg | ORAL_TABLET | Freq: Every day | ORAL | Status: DC

## 2012-05-01 MED ORDER — LISINOPRIL 40 MG PO TABS: 40.0000 mg | ORAL_TABLET | Freq: Every day | ORAL | Status: DC

## 2012-05-31 ENCOUNTER — Encounter: Payer: Self-pay | Admitting: Internal Medicine

## 2012-05-31 ENCOUNTER — Ambulatory Visit (INDEPENDENT_AMBULATORY_CARE_PROVIDER_SITE_OTHER): Payer: Medicare Other | Admitting: Internal Medicine

## 2012-05-31 VITALS — BP 144/80 | HR 73 | Temp 99.4°F | Ht 64.5 in | Wt 219.3 lb

## 2012-05-31 DIAGNOSIS — I10 Essential (primary) hypertension: Secondary | ICD-10-CM

## 2012-05-31 DIAGNOSIS — R5383 Other fatigue: Secondary | ICD-10-CM

## 2012-05-31 DIAGNOSIS — R5381 Other malaise: Secondary | ICD-10-CM

## 2012-05-31 DIAGNOSIS — D638 Anemia in other chronic diseases classified elsewhere: Secondary | ICD-10-CM

## 2012-05-31 DIAGNOSIS — F209 Schizophrenia, unspecified: Secondary | ICD-10-CM

## 2012-05-31 DIAGNOSIS — K921 Melena: Secondary | ICD-10-CM

## 2012-05-31 DIAGNOSIS — J309 Allergic rhinitis, unspecified: Secondary | ICD-10-CM

## 2012-05-31 MED ORDER — LORATADINE 10 MG PO TABS: 10.0000 mg | ORAL_TABLET | Freq: Every day | ORAL | Status: DC

## 2012-05-31 NOTE — Patient Instructions (Signed)
1.  Make sure when you use the nasal spray that you aim away from the center of your nose.  Also use the spray 1 spray in each nostril twice daily.  2.  Stop the Cetirizine and start the Claritin (Loratidine) 10 mg tablets take 1 tablet daily for your allergies.  3.  See me back in about 1 month.

## 2012-05-31 NOTE — Progress Notes (Signed)
Subjective:   [name]   DOB: [birth year] 71 y.o.   MRN: [id number]  HPI: [name] is a 71 y.o. woman who presents to clinic today complaining of a cough that has been present for 2 months.  She states that the cough produces a gray to green sputum with occasional blood streaks.  She also states that she has fevers but doesn't take her temperature as well as chills occasionally that were all present about 3 weeks ago.  She states that since then the cough has lessened and the production for her cough as stopped.  She states that the cough is worse right when she wakes up in the morning.  She denies shortness of breath.  She states that the nasal spray that she is using is "too strong because it gives me a stabbing pain in my head when I use it."   She states that she "feels that [name] is a good doctor but that he is was trying to kill me with that medications."  She states that she has stopped her Haldol because it was giving her tremors and she "feels like I'm doing better without it."  She denies SI, HI, auditory or visual hallucinations, sleep problems, or fatigue.  She states that "I trust you [name] and if you want me to go back to him I will."    She states also has concerns about diabetes as well as her anemia.  She states that she has been told before that she has diabetes and is sure that she has it.  She states that she is tired all the time but denies polyuria or polydipsia.  She has not had any large weight gain since the last time we tested her, in fact she has lost about 10-15 lbs.  She also stopped her iron pills because they were constipating her.    Past Medical History  Diagnosis Date  . History of cervical cancer 1972  . Ovarian cancer 1972    S/P oophorectomy  . GERD (gastroesophageal reflux disease)   . Gout   . Hypertension   . DJD (degenerative joint disease), multiple sites     Low back pain worst  . DDD (degenerative disc  disease), lumbosacral   . Osteoarthritis of hip     bilateral hips  . Hx-TIA (transient ischemic attack) 05/13/2001    Right facial numbness  . Sensorineural hearing loss of both ears   . Mitral valve prolapse 10/27/1999    Subsequent 2D echo show normal mitral valve  . Chronic venous insufficiency   . Colon, diverticulosis Sept. 2011    outpatient colonoscopy by Dr. [name].  Need record  . Hyperlipidemia   . Recurrent boils     History of MRSA skin infections with abscess  . Gout     Uric Acid level 4.2 on 300 allopurinol  . Depression    Current Outpatient Prescriptions  Medication Sig Dispense Refill  . allopurinol (ZYLOPRIM) 300 MG tablet Take 1 tablet (300 mg total) by mouth daily.  30 tablet  6  . amLODipine (NORVASC) 5 MG tablet Take 1 tablet (5 mg total) by mouth daily.  90 tablet  3  . Apple Cider Vinegar 300 MG TABS Take 1 tablet by mouth daily.        . Ascorbic Acid (VITAMIN C) 500 MG tablet Take 500 mg by mouth daily.        [name] atorvastatin (LIPITOR) 80 MG tablet  Take 1 tablet (80 mg total) by mouth daily.  30 tablet  6  . benzocaine-Menthol (DERMOPLAST) 20-0.5 % AERO Apply 1 application topically 3 (three) times daily as needed. Apply to back as needed for itching and skin irritation       . benztropine (COGENTIN) 0.5 MG tablet Take 1 tablet (0.5 mg total) by mouth daily.  90 tablet  0  . Biotin (BIOTIN 5000) 5 MG CAPS Take 1 capsule by mouth daily.        . Calcium Carbonate-Vitamin D (CALCIUM-VITAMIN D) 500-200 MG-UNIT per tablet Take 1 tablet by mouth daily.        . cetirizine (ZYRTEC) 10 MG tablet Take 1 tablet (10 mg total) by mouth daily.  30 tablet  6  . clopidogrel (PLAVIX) 75 MG tablet Take 1 tablet (75 mg total) by mouth daily.  90 tablet  3  . clotrimazole (GYNE-LOTRIMIN) 1 % vaginal cream Place 1 Applicatorful vaginally 2 (two) times daily.  45 g  2  . conjugated estrogens (PREMARIN) vaginal cream Place vaginally at bedtime.  60 g  12  . Elastic Bandages &  Supports (T.E.D. KNEE LENGTH/L-REGULAR) MISC 1 Units by Does not apply route daily.        . ferrous sulfate 325 (65 FE) MG tablet Take 1 tablet (325 mg total) by mouth daily.  30 tablet  3  . fluocinonide cream (LIDEX) 0.05 % Apply 1 application topically 2 (two) times daily.  60 g  3  . fluticasone (FLONASE) 50 MCG/ACT nasal spray Place 2 sprays into the nose daily.  16 g  6  . furosemide (LASIX) 20 MG tablet Take 1 tablet (20 mg total) by mouth daily. Okay to use one more tablet daily for increased swelling as needed.  45 tablet  6  . haloperidol (HALDOL) 1 MG tablet Take 1 tablet (1 mg total) by mouth at bedtime.  90 tablet  0  . lisinopril (PRINIVIL,ZESTRIL) 40 MG tablet Take 1 tablet (40 mg total) by mouth daily.  90 tablet  3  . nystatin (MYCOSTATIN/NYSTOP) 100000 UNIT/GM POWD Apply 1 g (100,000 Units total) topically 2 (two) times daily. Apply to affected area twice daily  60 g  6  . oxybutynin (DITROPAN-XL) 5 MG 24 hr tablet Take 1 tablet (5 mg total) by mouth at bedtime.  30 tablet  6  . pantoprazole (PROTONIX) 40 MG tablet Take 1 tablet (40 mg total) by mouth daily.  30 tablet  6  . piroxicam (FELDENE) 20 MG capsule Take 1 capsule (20 mg total) by mouth daily.  90 capsule  4  . polyethylene glycol (MIRALAX / GLYCOLAX) packet Take 17 g by mouth daily as needed.  30 each  6  . potassium chloride (KLOR-CON 10) 10 MEQ tablet Take 1 tablet (10 mEq total) by mouth daily.  30 tablet  6   Family History  Problem Relation Age of Onset  . Diabetes Mother   . Heart disease Mother   . Hearing loss Mother   . Stroke Mother   . Schizophrenia Maternal Grandmother    History   Social History  . Marital Status: Widowed    Spouse Name: N/A    Number of Children: N/A  . Years of Education: N/A   Social History Main Topics  . Smoking status: Former Smoker    Quit date: 10/30/1997  . Smokeless tobacco: Never Used  . Alcohol Use: No  . Drug Use: No  . Sexually Active: No  Other Topics  Concern  . None   Social History Narrative  . None   Review of Systems: Constitutional: Positive for fatigue. Denies fever, chills, diaphoresis, appetite change.  HEENT: Positive for congestion.  Denies photophobia, eye pain, redness, hearing loss, ear pain, sore throat, rhinorrhea, sneezing, mouth sores, trouble swallowing, neck pain, neck stiffness and tinnitus.   Respiratory: Positive for cough. Denies SOB, DOE, chest tightness,  and wheezing.   Cardiovascular: Denies chest pain, palpitations and leg swelling.  Gastrointestinal: Denies nausea, vomiting, abdominal pain, diarrhea, constipation, blood in stool and abdominal distention.  Genitourinary: Denies dysuria, urgency, frequency, hematuria, flank pain and difficulty urinating.  Musculoskeletal: Denies myalgias, back pain, joint swelling, arthralgias and gait problem.  Skin: Denies pallor, rash and wound.  Neurological: Denies dizziness, seizures, syncope, weakness, light-headedness, numbness and headaches.  Hematological: Denies adenopathy. Easy bruising, personal or family bleeding history  Psychiatric/Behavioral: Denies suicidal ideation, mood changes, confusion, nervousness, sleep disturbance and agitation  Objective:  Physical Exam: Filed Vitals:   05/31/12 1619  BP: 144/80  Pulse: 73  Temp: 99.4 F (37.4 C)  TempSrc: Oral  Height: 5' 4.5" (1.638 m)  Weight: 219 lb 4.8 oz (99.474 kg)  SpO2: 98%   Constitutional: Vital signs reviewed.  Patient is a well-developed and well-nourished obese woman in no acute distress and cooperative with exam. Alert and oriented x3.  Head: Normocephalic and atraumatic Ear: TM normal bilaterally Mouth: no erythema or exudates, MMM Nose: mild nasal papillae noted.   Eyes: PERRL, EOMI, conjunctivae normal, No scleral icterus.  Neck: Supple, Trachea midline normal ROM, No JVD, mass, thyromegaly, or carotid bruit present.  Cardiovascular: RRR, S1 normal, S2 normal, no MRG, pulses symmetric  and intact bilaterally Pulmonary/Chest: CTAB, no wheezes, rales, or rhonchi Abdominal: Soft. Non-tender, non-distended, bowel sounds are normal, no masses, organomegaly, or guarding present.  GU: no CVA tenderness Musculoskeletal: No joint deformities, erythema, or stiffness, ROM full and no nontender Hematology: no cervical, inginal, or axillary adenopathy.  Neurological: A&O x3, Strength is normal and symmetric bilaterally, cranial nerve II-XII are grossly intact, no focal motor deficit, sensory intact to light touch bilaterally.  Skin: Warm, dry and intact. No rash, cyanosis, or clubbing.  Psychiatric: Normal mood and variable affect. speech is quick but not pressured.  Behavior is normal. Judgment and insight are poor.  Thought content normal with some mild delusions of persecution as well as hyper-religiousity . Cognition and memory are normal.   Assessment & Plan:

## 2012-06-03 ENCOUNTER — Telehealth: Payer: Self-pay | Admitting: *Deleted

## 2012-06-03 NOTE — Telephone Encounter (Signed)
Her A1C has been borderline the last few times we have checked it and when she was here on Friday she had none of these complaints.  She also was scheduled late in the day and was not seen until after 5 pm so we were unable to get labs at that visit.  When she has her follow up appointment she will need a recheck of her lipid panel, A1C, a CBC, and a repeat anemia panel to see if she needs to continue her iron supplementation.  We were going to address all of these at her follow up visit with me in about 1 month.  She also was told that she needs to make a follow up appointment with [name] because she had stopped her antipsychotic and has not been back to see him since.

## 2012-06-03 NOTE — Telephone Encounter (Signed)
Pt calls and states she is having rectal bleeding with"caramel colored stools) she states she is weak and can hardly get around. She also states she has been told by" about 3 good catholic doctors that i am probably a diabetic", she is very concerned and states she needs to see a doctor that is going to treat these issues. appt is set for 8/6 @1400  w/ dr Dorise Hiss per chilonb. i suggested going to the ED and she refuses.

## 2012-06-04 ENCOUNTER — Ambulatory Visit (INDEPENDENT_AMBULATORY_CARE_PROVIDER_SITE_OTHER): Payer: Medicare Other | Admitting: Internal Medicine

## 2012-06-04 ENCOUNTER — Encounter: Payer: Self-pay | Admitting: Internal Medicine

## 2012-06-04 VITALS — BP 113/70 | HR 85 | Temp 98.6°F | Ht 64.0 in | Wt 213.0 lb

## 2012-06-04 DIAGNOSIS — Z79899 Other long term (current) drug therapy: Secondary | ICD-10-CM

## 2012-06-04 DIAGNOSIS — R197 Diarrhea, unspecified: Secondary | ICD-10-CM

## 2012-06-04 DIAGNOSIS — D509 Iron deficiency anemia, unspecified: Secondary | ICD-10-CM

## 2012-06-04 DIAGNOSIS — E119 Type 2 diabetes mellitus without complications: Secondary | ICD-10-CM

## 2012-06-04 LAB — ANEMIA PANEL
%SAT: 6 % — ABNORMAL LOW (ref 20–55)
ABS Retic: 44.4 10*3/uL (ref 19.0–186.0)
Ferritin: 94 ng/mL (ref 10–291)
Folate: >20 ng/mL
Iron: 14 ug/dL — ABNORMAL LOW (ref 42–145)
RBC.: 3.7 MIL/uL — ABNORMAL LOW (ref 3.87–5.11)
Retic Ct Pct: 1.2 % (ref 0.4–2.3)
TIBC: 220 ug/dL — ABNORMAL LOW (ref 250–470)
UIBC: 206 ug/dL (ref 125–400)
Vitamin B-12: 728 pg/mL (ref 211–911)

## 2012-06-04 LAB — CBC
HCT: 34.2 % — ABNORMAL LOW (ref 36.0–46.0)
Hemoglobin: 11.3 g/dL — ABNORMAL LOW (ref 12.0–15.0)
MCH: 30.8 pg (ref 26.0–34.0)
MCHC: 33 g/dL (ref 30.0–36.0)
MCV: 93.2 fL (ref 78.0–100.0)
Platelets: 302 10*3/uL (ref 150–400)
RBC: 3.67 MIL/uL — ABNORMAL LOW (ref 3.87–5.11)
RDW: 16 % — ABNORMAL HIGH (ref 11.5–15.5)
WBC: 8.2 10*3/uL (ref 4.0–10.5)

## 2012-06-04 LAB — LIPID PANEL
Cholesterol: 135 mg/dL (ref 0–200)
HDL: 35 mg/dL — ABNORMAL LOW (ref >39)
LDL Cholesterol: 76 mg/dL (ref 0–99)
Total CHOL/HDL Ratio: 3.9 Ratio
Triglycerides: 122 mg/dL (ref <150)
VLDL: 24 mg/dL (ref 0–40)

## 2012-06-04 LAB — POCT GLYCOSYLATED HEMOGLOBIN (HGB A1C): Hemoglobin A1C: 6.1

## 2012-06-04 LAB — GLUCOSE, CAPILLARY: Glucose-Capillary: 106 mg/dL — ABNORMAL HIGH (ref 70–99)

## 2012-06-04 MED ORDER — DIPHENOXYLATE-ATROPINE 2.5-0.025 MG PO TABS: 1.0000 | ORAL_TABLET | Freq: Four times a day (QID) | ORAL | Status: DC | PRN

## 2012-06-04 NOTE — Patient Instructions (Addendum)
You are going to be given a medicine for diarrhea that you can take up to 4 times per day. Continue to drink lots of liquids and try to eat while you are having the diarrhea. If you continue to see blood in your stools please use the cards we will give you to bring in to the clinic. We will see you back in 2 weeks for a follow up. If you are having worsening diarrhea or bleeding please call our office at [phone]. Your blood counts are stable but you might need further work up in the future for your stools. Do not take any iron or miralax.

## 2012-06-05 ENCOUNTER — Telehealth: Payer: Self-pay | Admitting: *Deleted

## 2012-06-05 ENCOUNTER — Telehealth: Payer: Self-pay | Admitting: Licensed Clinical Social Worker

## 2012-06-05 NOTE — Telephone Encounter (Signed)
Not impossible but not likely. We think this is a viral illness that will likely resolve in the next 2-3 days. If it does not, she is to call us back.

## 2012-06-05 NOTE — Progress Notes (Signed)
Subjective:     Patient ID: [name], female   DOB: 06/11/1941, 71 y.o.   MRN: [id number]  HPI The patient is a 71 year old female who has past medical history of hypothyroidism, hyperlipidemia, anemia of chronic disease, chronic diastolic heart failure, hemorrhoids. She did call in over the weekend and states that she was seeing a little bit of blood in her stools " caramel" colored stools. She further clarify is at today's visit that she has been having watery profuse diarrhea this leaking out of her for the past day and a half. No exposure to any antibiotics recently. No gross blood today. She states that she's been oriented adult diapers to control the flow of her stool. She did have perfuse stool prior to coming today however is not able to stool at today's visit. She has maintained will good hydration with fluids. She states her appetite is slightly decreased. She does have prescription for MiraLAX however states she has not been using it for the past month because she's been going to much on her own. She has not had any recent changes to her medical regimen and now that she noticed has been sick around her however she does describe some people in her family have been sick recently however she is unable to describe her symptoms. She has not had any fevers, chills. No feelings of dizziness lightheadedness or falls at home. She states that having this much diarrhea is giving her some pain. She is not currently in pain.  Review of Systems     Objective:   Physical Exam  Constitutional: She is oriented to person, place, and time. She appears well-developed and well-nourished.       Obese, in mild distress, on scooter chair.   HENT:  Head: Normocephalic and atraumatic.  Eyes: EOM are normal. Pupils are equal, round, and reactive to light.  Neck: Normal range of motion. Neck supple.  Cardiovascular: Normal rate.   Pulmonary/Chest: Effort normal.  Abdominal: Soft. Bowel sounds are normal.  She exhibits no distension and no mass. There is tenderness. There is no rebound and no guarding.       Bowel sounds slightly hyperactive.  Musculoskeletal: Normal range of motion.  Neurological: She is alert and oriented to person, place, and time.  Skin: Skin is warm and dry.       Assessment/Plan:   1. Diarrhea with questionable blood in stools-patient did have stat CBC checked at today's visit which does show stability of hemoglobin counts over the past 8 months. She is currently not having any stools for [place] to check and we did give her some stool cards to send in to the clinic to check to see if she's having any blood in her stools. We did advise her that this is likely a viral gastroenteritis which should resolve. Did prescribe Lomotil for her and call this into her pharmacy so that they would bring it to her. Told her that she can use this when necessary for her diarrhea until this ceases however that this should resolve in the next 3-4 days at home. Advised her that if this did not resolve she should call our clinic to be seen again or for further instruction. Do feel as though the patient may need further workup for this possible GI bleeding in the future however acutely it's not an emergency. We will see her back in 2 weeks if she is doing well for very close followup. At that time if she is still  having symptoms it may be wise to recheck a CBC.   2. Decision-the patient was hemodynamically stable to go home at today's visit. We did advise close followup in 2 weeks. We did give her Lomotil for diarrhea. Advised her to stay continually hydrated. Did advise her to eat if she is able. Did advise her not to take MiraLAX. Did draw some other routine labs that were unable to be drawn at previous visit and will route the results of that to her PCP who did request these labs be drawn.

## 2012-06-05 NOTE — Telephone Encounter (Signed)
Pt was called and instructed per Dr Dorise Hiss about the diarrhea; pt agreed to call if persists.

## 2012-06-05 NOTE — Telephone Encounter (Signed)
CSW returning call to [name]. CSW had lengthy conversation with [name] regarding multiple issues including: finances, medication changes and religion. [name]'s main complaint today is difficulty performing her ADL's due to her current "viral condition".  [name] states she is having loose stools, having to wear multiple disposable undergarments, and is afraid to eat.  [name] states at one time she had home making services, but they were not allowed to perform any personal care.  [name] denies having medicaid insurance and is unable to private pay for personal care services.  CSW provided [name] with the number to Adult Medicaid to inquire about eligibility.  Also, CSW discussed possible option of having short term home health to assist with ADL's.  CSW informed [name], will forward to physician to inquire if it is medically necessary.

## 2012-06-05 NOTE — Telephone Encounter (Signed)
Call from pt.  Wants Dr Dorise Hiss to be aware the change in her diet has been - she usually drinks spring water but someone bought her purified water Instead. Wondering if this could be causing the diarrhea? I also called [facility], per pt's request, to ask if they could deliver Lomotil today instead of tomorrow.  Stated she will call the pt.

## 2012-06-06 ENCOUNTER — Encounter: Payer: Self-pay | Admitting: Licensed Clinical Social Worker

## 2012-06-06 NOTE — Telephone Encounter (Signed)
A user error has taken place: encounter opened in error, closed for administrative reasons.

## 2012-06-06 NOTE — Telephone Encounter (Signed)
Discussed with physician and Home Health liaison.  Home health would be able to see pt next week and with current viral condition should be resolving around same time.  CSW placed call to Ms. Izzo to offer suggestions of private duty aides to assist with personal care.  CSW left message requesting return call.

## 2012-06-13 ENCOUNTER — Other Ambulatory Visit (INDEPENDENT_AMBULATORY_CARE_PROVIDER_SITE_OTHER): Payer: Medicare Other

## 2012-06-13 DIAGNOSIS — Z1211 Encounter for screening for malignant neoplasm of colon: Secondary | ICD-10-CM

## 2012-06-13 DIAGNOSIS — D509 Iron deficiency anemia, unspecified: Secondary | ICD-10-CM

## 2012-06-13 LAB — POC HEMOCCULT BLD/STL (HOME/3-CARD/SCREEN)
Card #2 Fecal Occult Blod, POC: POSITIVE
Card #3 Fecal Occult Blood, POC: POSITIVE
Fecal Occult Blood, POC: NEGATIVE

## 2012-06-19 ENCOUNTER — Encounter: Payer: Self-pay | Admitting: Internal Medicine

## 2012-06-19 ENCOUNTER — Ambulatory Visit (INDEPENDENT_AMBULATORY_CARE_PROVIDER_SITE_OTHER): Payer: Medicare Other | Admitting: Internal Medicine

## 2012-06-19 VITALS — BP 111/68 | HR 63 | Temp 99.3°F | Ht 64.0 in | Wt 215.5 lb

## 2012-06-19 DIAGNOSIS — D638 Anemia in other chronic diseases classified elsewhere: Secondary | ICD-10-CM

## 2012-06-19 DIAGNOSIS — R195 Other fecal abnormalities: Secondary | ICD-10-CM

## 2012-06-19 NOTE — Patient Instructions (Signed)
We will see you back in 4 weeks and not change any of your medicines today. We would not like you to take iron. We would like you to go see your GI doctor, Dr. [name] for a check of your colon. If you have any questions or problems please call our office at [phone]. We will work on getting you more help at home.   Home Safety and Preventing Falls Falls are a leading cause of injury and while they affect all age groups, falls have greater short-term and long-term impact on older age groups. However, falls should not be a part of life or aging. It is possible for individuals and their families to use preventive measures to significantly decrease the likelihood that anyone, especially an older adult, will fall. There are many simple measures which can make your home safer with respect to preventing falls. The following actions can help reduce falls among all members of your family and are especially important as you age, when your balance, lower limb strength, coordination, and eyesight may be declining. The use of preventive measures will help to reduce you and your family's risk of falls and serious medical consequences. OUTDOORS  Repair cracks and edges of walkways and driveways.   Remove high doorway thresholds and trim shrubbery on the main path into your home.   Ensure there is good outside lighting at main entrances and along main walkways.   Clear walkways of tools, rocks, debris, and clutter.   Check that handrails are not broken and are securely fastened. Both sides of steps should have handrails.   In the garage, be attentive to and clean up grease or oil spills on the cement. This can make the surface extremely slippery.   In winter, have leaves, snow, and ice cleared regularly.   Use sand or salt on walkways during winter months.  BATHROOM  Install grab bars by the toilet and in the tub and shower.   Use non-skid mats or decals in the tub or shower.   If unable to easily  stand unsupported while showering, place a plastic non slip stool in the shower to sit on when needed.   Install night lights.   Keep floors dry and clean up all water on the floor immediately.   Remove soap buildup in tub or shower on a regular basis.   Secure bath mats with non-slip, double-sided rug tape.   Remove tripping hazards from the floors.  BEDROOMS  Install night lights.   Do not use oversized bedding.   Make sure a bedside light is easy to reach.   Keep a telephone by your bedside.   Make sure that you can get in and out of your bed easily.   Have a firm chair, with side arms, to use for getting dressed.   Remove clutter from around closets.   Store clothing, bed coverings, and other household items where you can reach them comfortably.   Remove tripping hazards from the floor.  LIVING AREAS AND STAIRWAYS  Turn on lights to avoid having to walk through dark areas.   Keep lighting uniform in each room. Place brighter lightbulbs in darker areas, including stairways.   Replace lightbulbs that burn out in stairways immediately.   Arrange furniture to provide for clear pathways.   Keep furniture in the same place.   Eliminate or tape down electrical cables in high traffic areas.   Place handrails on both sides of stairways. Use handrails when going up or down  stairs.   Most falls occur on the top or bottom 3 steps.   Fix any loose handrails. Make sure handrails on both sides of the stairways are as long as the stairs.   Remove all walkway obstacles.   Coil or tape electrical cords off to the side of walking areas and out of the way. If using many extension cords, have an electrician put in a new wall outlet to reduce or eliminate them.   Make sure spills are cleaned up quickly and allow time for drying before walking on freshly cleaned floors.   Firmly attach carpet with non-skid or two-sided tape.   Keep frequently used items within easy reach.    Remove tripping hazards such as throw rugs and clutter in walkways. Never leave objects on stairs.   Get rid of throw rugs elsewhere if possible.   Eliminate uneven floor surfaces.   Make sure couches and chairs are easy to get into and out of.   Check carpeting to make sure it is firmly attached along stairs.   Make repairs to worn or loose carpet promptly.   Select a carpet pattern that does not visually hide the edge of steps.   Avoid placing throw rugs or scatter rugs at the top or bottom of stairways, or properly secure with carpet tape to prevent slippage.   Have an electrician put in a light switch at the top and bottom of the stairs.   Get light switches that glow.   Avoid the following practices: hurrying, inattention, obscured vision, carrying large loads, and wearing slip-on shoes.   Be aware of all pets.  KITCHEN  Place items that are used frequently, such as dishes and food, within easy reach.   Keep handles on pots and pans toward the center of the stove. Use back burners when possible.   Make sure spills are cleaned up quickly and allow time for drying.   Avoid walking on wet floors.   Avoid hot utensils and knives.   Position shelves so they are not too high or low.   Place commonly used objects within easy reach.   If necessary, use a sturdy step stool with a grab bar when reaching.   Make sure electrical cables are out of the way.   Do not use floor polish or wax that makes floors slippery.  OTHER HOME FALL PREVENTION STRATEGIES  Wear low heel or rubber sole shoes that are supportive and fit well.   Wear closed toe shoes.   Know and watch for side effects of medications. Have your caregiver or pharmacist look at all your medicines, even over-the-counter medicines. Some medicines can make you sleepy or dizzy.   Exercise regularly. Exercise makes you stronger and improves your balance and coordination.   Limit use of alcohol.   Use  eyeglasses if necessary and keep them clean. Have your vision checked every year.   Organize your household in a manner that minimizes the need to walk distances when hurried, or go up and down stairs unnecessarily. For example, have a phone placed on at least each floor of your home. If possible, have a phone beside each sitting or lying area where you spend the most time at home. Keep emergency numbers posted at all phones.   Use non-skid floor wax.   When using a ladder, make sure:   The base is firm.   All ladder feet are on level ground.   The ladder is angled against the wall properly.  When climbing a ladder, face the ladder and hold the ladder rungs firmly.   If reaching, always keep your hips and body weight centered between the rails.   When using a stepladder, make sure it is fully opened and both spreaders are firmly locked.   Do not climb a closed stepladder.   Avoid climbing beyond the second step from the top of a stepladder and the 4th rung from the top of an extension ladder.   Learn and use mobility aids as needed.   Change positions slowly. Arise slowly from sitting and lying positions. Sit on the edge of your bed before getting to your feet.   If you have a history of falls, ask someone to add color or contrast paint or tape to grab bars and handrails in your home.   If you have a history of falls, ask someone to place contrasting color strips on first and last steps.   Install an electrical emergency response system if you need one, and know how to use it.   If you have a medical or other condition that causes you to have limited physical strength, it is important that you reach out to family and friends for occasional help.  FOR CHILDREN:  If young children are in the home, use safety gates. At the top of stairs use screw-mounted gates; use pressure-mounted gates for the bottom of the stairs and doorways between rooms.   Young children should be taught to  descend stairs on their stomachs, feet first, and later using the handrail.   Keep drawers fully closed to prevent them from being climbed on or pulled out entirely.   Move chairs, cribs, beds and other furniture away from windows.   Consider installing window guards on windows ground floor and up, unless they are emergency fire exits. Make sure they have easy release mechanisms.   Consider installing special locks that only allow the window to be opened to a certain height.   Never rely on window screens to prevent falls.   Never leave babies alone on changing tables, beds or sofas. Use a changing table that has a restraining strap.   When a child can pull to a standing position, the crib mattress should be adjusted to its lowest position. There should be at least 26 inches between the top rails of the crib drop side and the mattress. Toys, bumper pads, and other objects that can be used as steps to climb out should be removed from the crib.   On bunk beds never allow a child under age 34 to sleep on the top bunk. For older children, if the upper bunk is not against a wall, use guard rails on both sides. No matter how old a child is, keep the guard rails in place on the top bunk since children roll during sleep. Do not permit horseplay on bunks.   Grass and soil surfaces beneath backyard playground equipment should be replaced with hardwood chips, shredded wood mulch, sand, pea gravel, rubber, crushed stone, or another safer material at depths of at least 9 to 12 inches.   When riding bikes or using skates, skateboards, skis, or snowboards, require children to wear helmets. Look for those that have stickers stating that they meet or exceed safety standards.   Vertical posts or pickets in deck, balcony, and stairway railings should be no more than 3 1/2 inches apart if a young baby will have access to the area. The space between horizontal rails or  bars, and between the floor and the first  horizontal rail or bar, should be no more than 3 1/2 inches.  Document Released: 10/06/2002 Document Revised: 10/05/2011 Document Reviewed: 08/05/2009 [facility] Patient Information 2012 [place], [place].

## 2012-06-19 NOTE — Progress Notes (Signed)
Subjective:     Patient ID: [name], female   DOB: [birth year], 71 y.o.   MRN: [id number]  HPI The patient is a 71 year old female who comes in today for a follow up on her diarrhea which she was having at previous visit. At that time, she stated that she was having some blood in her stools, confirmed by hemoccult cards, and that she was having diarrhea. She was diagnosed with viral GE which did resolve in the next several days. She comes back today feeling better but still a little weak. She states that she is having some concern about feeling safe at home by herself although she has not fallen she has been close several times getting in and out of the shower seat she has. She is also stating that her mood is good at today's visit and she is having some decreased energy. She denies any nausea, vomiting, diarrhea, blood in her stools. She does note that she is having some blood on her underwear that she is unclear about the source. It is a small dot and only happened 2-3 times total. She is not constipated. She continues to go to church weekly and a nun "sister" there checks on her on saturdays. She states that otherwise she lives alone and does not have anyone with her all the time. She states that she is fine being alone but sometimes worries about her safety.   Review of Systems  Constitutional: Negative for fever, chills, activity change, appetite change, fatigue and unexpected weight change.  HENT: Negative.   Eyes: Negative.   Respiratory: Negative for cough, chest tightness, shortness of breath and wheezing.   Cardiovascular: Negative for chest pain, palpitations and leg swelling.  Gastrointestinal: Negative.  Negative for nausea, vomiting, abdominal pain, diarrhea, constipation, blood in stool, abdominal distention, anal bleeding and rectal pain.  Musculoskeletal: Positive for back pain, arthralgias and gait problem. Negative for myalgias and joint swelling.  Skin: Positive for wound.  Negative for color change, pallor and rash.  Neurological: Positive for weakness and numbness. Negative for dizziness, tremors, seizures, syncope, facial asymmetry, speech difficulty, light-headedness and headaches.       Patient has chronic neuropathy in her feet and still feels weak after viral GE.   Hematological: Negative.   Psychiatric/Behavioral: Negative.        Objective:   Physical Exam  Constitutional: She is oriented to person, place, and time. She appears well-developed and well-nourished.  HENT:  Head: Normocephalic and atraumatic.  Eyes: EOM are normal. Pupils are equal, round, and reactive to light.  Neck: Normal range of motion. Neck supple.  Cardiovascular: Normal rate and regular rhythm.   Pulmonary/Chest: Effort normal and breath sounds normal.  Abdominal: Soft. Bowel sounds are normal. She exhibits no distension. There is no tenderness. There is no rebound.  Musculoskeletal: Normal range of motion. She exhibits no edema and no tenderness.       Patient has chronic back pain. She ambulates short distances and uses scooter to get further distances.   Neurological: She is alert and oriented to person, place, and time. No cranial nerve deficit.  Skin: Skin is warm and dry.       Patient has some area of intertriginous yeast around her skin folds near the groin.   Psychiatric: She has a normal mood and affect. Her behavior is normal. Judgment and thought content normal.       Assessment/Plan:   1. Viral GE - resolved. No further treatment  needed.  2. Hemoccult positive stools - Patient has seen Dr. [name] in the past for colonoscopy and has history of non-malignant polyps and is likely due for colonoscopy again. Will schedule apt in September for the patient for follow up. Good stability of Hg during this event and anemia panel does not show iron deficiency and will not restart iron for this patient.   3. Disposition - The patient will be seen back in 4 weeks for  follow up. At that time functional status should be followed up on. Information was given to the patient regarding safety and preventing falls in the home. No new medications were given at this visit and no refills were required. Patient does use scooter to get around long distances but is able to ambulate short distances and maintain her balance.

## 2012-07-03 ENCOUNTER — Other Ambulatory Visit: Payer: Self-pay | Admitting: *Deleted

## 2012-07-03 DIAGNOSIS — J309 Allergic rhinitis, unspecified: Secondary | ICD-10-CM

## 2012-07-03 DIAGNOSIS — L259 Unspecified contact dermatitis, unspecified cause: Secondary | ICD-10-CM

## 2012-07-03 MED ORDER — FLUTICASONE PROPIONATE 50 MCG/ACT NA SUSP: 2.0000 | Freq: Every day | NASAL | Status: DC

## 2012-07-03 MED ORDER — OXYBUTYNIN CHLORIDE ER 5 MG PO TB24: 5.0000 mg | ORAL_TABLET | Freq: Every day | ORAL | Status: DC

## 2012-07-03 MED ORDER — POTASSIUM CHLORIDE ER 10 MEQ PO TBCR: 10.0000 meq | EXTENDED_RELEASE_TABLET | Freq: Every day | ORAL | Status: DC

## 2012-07-03 MED ORDER — FLUOCINONIDE 0.05 % EX CREA: 1.0000 "application " | TOPICAL_CREAM | Freq: Two times a day (BID) | CUTANEOUS | Status: DC

## 2012-07-17 NOTE — Assessment & Plan Note (Signed)
She is due for a recheck of her CBC because of her chronic anemia.  She was seen at the end of the day after lab had closed.  We will have her return for a CBC as well as a repeat iron panel to ensure that she is iron replete.

## 2012-07-17 NOTE — Assessment & Plan Note (Signed)
Her lungs are clear today and she states that the cough is clearing up.  She has some allergic polyps noted in the nasal passages.  When we discussed technique of the nasal steroid she is spraying it toward the septum which can definitely be the cause of the burning and pain that she is having.  We discussed proper technique with aiming toward the outside of the nose to avoid the pain.  We will also swap her antihistamine back to Claritin to see if that helps give her more relief then the Zyrtec for now.

## 2012-07-17 NOTE — Assessment & Plan Note (Signed)
BP Readings from Last 3 Encounters:  05/31/12 144/80  02/28/12 128/74  12/27/11 140/82   Her blood pressure today is mildly elevated above her goal but has been well controlled.  We will continue to monitor for now.

## 2012-07-17 NOTE — Assessment & Plan Note (Signed)
I encouraged her to make a follow up appointment with [name] and reassured her that I'm sure that he was trying to help you.  She was cautioned about stopping her medication without discussing it with the doctor.

## 2012-07-17 NOTE — Assessment & Plan Note (Signed)
She states that she wants to go back to see Dr. Ronney Asters but was told that she needed a referral to do that.  She would not tell me why.  I will refer her and I encouraged her to let me know if there is anything that I can do for her if she can't get to see him soon.

## 2012-07-17 NOTE — Assessment & Plan Note (Signed)
She continues to complain of fatigue.  She states that she is convinced that she has diabetes.  Her last A1C done as a screening was 6.2 which is not diagnostic for diabetes it does suggest some mild glucose intolerance.  She was seen late in the day and lab was unable to collect her labs.  She will return for a repeat A1C as well as a recheck of her CBC and iron panel to ensure she is iron replete.

## 2012-07-30 ENCOUNTER — Ambulatory Visit (INDEPENDENT_AMBULATORY_CARE_PROVIDER_SITE_OTHER): Payer: Medicare Other

## 2012-07-30 DIAGNOSIS — Z23 Encounter for immunization: Secondary | ICD-10-CM

## 2012-08-01 ENCOUNTER — Other Ambulatory Visit: Payer: Self-pay | Admitting: *Deleted

## 2012-08-01 DIAGNOSIS — M109 Gout, unspecified: Secondary | ICD-10-CM

## 2012-08-01 MED ORDER — ATORVASTATIN CALCIUM 80 MG PO TABS: 80.0000 mg | ORAL_TABLET | Freq: Every day | ORAL | Status: DC

## 2012-08-01 MED ORDER — ALLOPURINOL 300 MG PO TABS: 300.0000 mg | ORAL_TABLET | Freq: Every day | ORAL | Status: DC

## 2012-08-30 ENCOUNTER — Other Ambulatory Visit: Payer: Self-pay | Admitting: *Deleted

## 2012-08-30 MED ORDER — PANTOPRAZOLE SODIUM 40 MG PO TBEC: 40.0000 mg | DELAYED_RELEASE_TABLET | Freq: Every day | ORAL | Status: DC

## 2012-08-30 NOTE — Telephone Encounter (Signed)
Was seen Aug. 1 month F/U requested. No appt given. Pls sch first appt with PCp

## 2012-08-30 NOTE — Telephone Encounter (Signed)
Sent note to front desk pool to work on appt with PCP.

## 2012-09-20 ENCOUNTER — Encounter: Payer: Self-pay | Admitting: Internal Medicine

## 2012-09-25 ENCOUNTER — Telehealth: Payer: Self-pay | Admitting: *Deleted

## 2012-09-25 NOTE — Telephone Encounter (Signed)
I look forward to it.  Please make sure if you talk to her again that we would like a copy of Dr. Donavan Burnet notes when she has the endoscopy done.

## 2012-09-25 NOTE — Telephone Encounter (Signed)
1) pt has been taking african mango 2) hgb was lower than last at dr buccini's office 3) also has been taking some other herbal supplements, doesn't know that names 4) going to have endo soon 5) she will bring the bottles of herbal supplements to her next appt

## 2012-10-02 NOTE — Telephone Encounter (Signed)
i am going to call dr buccini's office

## 2012-10-04 ENCOUNTER — Ambulatory Visit (INDEPENDENT_AMBULATORY_CARE_PROVIDER_SITE_OTHER): Payer: Medicare Other | Admitting: Internal Medicine

## 2012-10-04 VITALS — BP 140/85 | HR 77 | Temp 98.2°F | Resp 20 | Ht 63.0 in | Wt 212.5 lb

## 2012-10-04 DIAGNOSIS — N183 Chronic kidney disease, stage 3 unspecified: Secondary | ICD-10-CM | POA: Insufficient documentation

## 2012-10-04 DIAGNOSIS — M25559 Pain in unspecified hip: Secondary | ICD-10-CM

## 2012-10-04 DIAGNOSIS — Z23 Encounter for immunization: Secondary | ICD-10-CM

## 2012-10-04 DIAGNOSIS — R61 Generalized hyperhidrosis: Secondary | ICD-10-CM

## 2012-10-04 DIAGNOSIS — I10 Essential (primary) hypertension: Secondary | ICD-10-CM

## 2012-10-04 DIAGNOSIS — F209 Schizophrenia, unspecified: Secondary | ICD-10-CM

## 2012-10-04 DIAGNOSIS — F205 Residual schizophrenia: Secondary | ICD-10-CM

## 2012-10-04 LAB — CBC WITH DIFFERENTIAL/PLATELET
Basophils Absolute: 0 10*3/uL (ref 0.0–0.1)
Basophils Relative: 0 % (ref 0–1)
Eosinophils Absolute: 0.6 10*3/uL (ref 0.0–0.7)
Eosinophils Relative: 7 % — ABNORMAL HIGH (ref 0–5)
HCT: 32.8 % — ABNORMAL LOW (ref 36.0–46.0)
Hemoglobin: 10.6 g/dL — ABNORMAL LOW (ref 12.0–15.0)
Lymphocytes Relative: 29 % (ref 12–46)
Lymphs Abs: 2.4 10*3/uL (ref 0.7–4.0)
MCH: 29.2 pg (ref 26.0–34.0)
MCHC: 32.3 g/dL (ref 30.0–36.0)
MCV: 90.4 fL (ref 78.0–100.0)
Monocytes Absolute: 0.4 10*3/uL (ref 0.1–1.0)
Monocytes Relative: 5 % (ref 3–12)
Neutro Abs: 4.9 10*3/uL (ref 1.7–7.7)
Neutrophils Relative %: 59 % (ref 43–77)
Platelets: 358 10*3/uL (ref 150–400)
RBC: 3.63 MIL/uL — ABNORMAL LOW (ref 3.87–5.11)
RDW: 17.4 % — ABNORMAL HIGH (ref 11.5–15.5)
WBC: 8.3 10*3/uL (ref 4.0–10.5)

## 2012-10-04 LAB — BASIC METABOLIC PANEL
BUN: 21 mg/dL (ref 6–23)
CO2: 25 mEq/L (ref 19–32)
Calcium: 9.2 mg/dL (ref 8.4–10.5)
Chloride: 106 mEq/L (ref 96–112)
Creat: 1.75 mg/dL — ABNORMAL HIGH (ref 0.50–1.10)
Glucose, Bld: 79 mg/dL (ref 70–99)
Potassium: 4.5 mEq/L (ref 3.5–5.3)
Sodium: 139 mEq/L (ref 135–145)

## 2012-10-04 LAB — POCT URINALYSIS DIPSTICK
Bilirubin, UA: NEGATIVE
Blood, UA: NEGATIVE
Glucose, UA: NEGATIVE
Ketones, UA: NEGATIVE
Leukocytes, UA: NEGATIVE
Nitrite, UA: NEGATIVE
Protein, UA: NEGATIVE
Spec Grav, UA: 1.015
Urobilinogen, UA: 0.2
pH, UA: 5

## 2012-10-04 MED ORDER — BLACK COHOSH 20 MG PO TABS: 20.0000 mg | ORAL_TABLET | Freq: Every day | ORAL | Status: DC

## 2012-10-04 MED ORDER — ZOSTER VACCINE LIVE 19400 UNT/0.65ML ~~LOC~~ SOLR: 0.6500 mL | Freq: Once | SUBCUTANEOUS | Status: DC

## 2012-10-04 NOTE — Patient Instructions (Signed)
1.  Stop in the lab to have your blood drawn.  I will call you if we need to talk about the results at all  2.  Continue your medications as prescribed.  It is okay use the African mango but I would take it as far apart as you can from your Plavix  3.  Stop the [facility].  We will try to figure out why you are having the sweats.  4.  Keep your appointment with Dr. Ronney Asters.    5.  Follow up with me in February.

## 2012-10-04 NOTE — Progress Notes (Signed)
Subjective:   [name]   DOB: July 19, 1941 71 y.o.   MRN: [id number]  HPI: [name] is a 71 y.o. woman who presents to clinic today with multiple complaints.    She states that she has been having some sweats at night for a month.  She states that she will wake up after soaking the bed.  SHe states that she has been trying to lose weight and has lost about 15 lbs since February in the EMR.  Nails have been very brittle and she has had some constipation occasionally but has been using her stool softener and miralax.  Some cough some white production. She denies back pain, hemoptysis, chest pain, or abdominal pain.    She states that she stopped her haldol because she felt it was "sapping the life out of me."  [facility] hasn't been back to see [name] because she is worried "that man is trying to kill me."  She denies SI, HI, visual, auditory, or tactile hallucinations.  She has been sleeping better by going to bed at 12 am and then waking up at 6 am.    She has a history of CKD stage III and hasn't been followed in a while.  She also asks today about the varicella vaccination.    She is also asking about if she can get a lift chair and some more help around the home.  She states that her hip pain makes it difficult for her to get around and she sometimes has a hard time getting off her couch or up from her chair. She denies falls or weakness in her legs.    Past Medical History  Diagnosis Date  . History of cervical cancer 1972  . Ovarian cancer 1972    S/P oophorectomy  . GERD (gastroesophageal reflux disease)   . Gout   . Hypertension   . DJD (degenerative joint disease), multiple sites     Low back pain worst  . DDD (degenerative disc disease), lumbosacral   . Osteoarthritis of hip     bilateral hips  . Hx-TIA (transient ischemic attack) 05/13/2001    Right facial numbness  . Sensorineural hearing loss of both ears   . Mitral valve prolapse 10/27/1999     Subsequent 2D echo show normal mitral valve  . Chronic venous insufficiency   . Colon, diverticulosis Sept. 2011    outpatient colonoscopy by Dr. [name].  Need record  . Hyperlipidemia   . Recurrent boils     History of MRSA skin infections with abscess  . Gout     Uric Acid level 4.2 on 300 allopurinol  . Depression    Current Outpatient Prescriptions  Medication Sig Dispense Refill  . allopurinol (ZYLOPRIM) 300 MG tablet Take 1 tablet (300 mg total) by mouth daily.  30 tablet  6  . amLODipine (NORVASC) 5 MG tablet Take 1 tablet (5 mg total) by mouth daily.  90 tablet  3  . Apple Cider Vinegar 300 MG TABS Take 1 tablet by mouth daily.        . Ascorbic Acid (VITAMIN C) 500 MG tablet Take 500 mg by mouth daily.        [name] atorvastatin (LIPITOR) 80 MG tablet Take 1 tablet (80 mg total) by mouth daily.  30 tablet  6  . benzocaine-Menthol (DERMOPLAST) 20-0.5 % AERO Apply 1 application topically 3 (three) times daily as needed. Apply to back as needed for itching and skin  irritation       . benztropine (COGENTIN) 0.5 MG tablet Take 1 tablet (0.5 mg total) by mouth daily.  90 tablet  0  . Biotin (BIOTIN 5000) 5 MG CAPS Take 1 capsule by mouth daily.        . Calcium Carbonate-Vitamin D (CALCIUM-VITAMIN D) 500-200 MG-UNIT per tablet Take 1 tablet by mouth daily.        . clopidogrel (PLAVIX) 75 MG tablet Take 1 tablet (75 mg total) by mouth daily.  90 tablet  3  . clotrimazole (GYNE-LOTRIMIN) 1 % vaginal cream Place 1 Applicatorful vaginally 2 (two) times daily.  45 g  2  . conjugated estrogens (PREMARIN) vaginal cream Place vaginally at bedtime.  60 g  12  . diphenoxylate-atropine (LOMOTIL) 2.5-0.025 MG per tablet Take 1 tablet by mouth 4 (four) times daily as needed for diarrhea or loose stools.  30 tablet  1  . Elastic Bandages & Supports (T.E.D. KNEE LENGTH/L-REGULAR) MISC 1 Units by Does not apply route daily.        . fluocinonide cream (LIDEX) 0.05 % Apply 1 application topically 2  (two) times daily.  60 g  1  . fluticasone (FLONASE) 50 MCG/ACT nasal spray Place 2 sprays into the nose daily.  16 g  6  . furosemide (LASIX) 20 MG tablet Take 1 tablet (20 mg total) by mouth daily. Okay to use one more tablet daily for increased swelling as needed.  45 tablet  6  . haloperidol (HALDOL) 1 MG tablet Take 1 tablet (1 mg total) by mouth at bedtime.  90 tablet  0  . lisinopril (PRINIVIL,ZESTRIL) 40 MG tablet Take 1 tablet (40 mg total) by mouth daily.  90 tablet  3  . loratadine (CLARITIN) 10 MG tablet Take 1 tablet (10 mg total) by mouth daily.  30 tablet  1  . nystatin (MYCOSTATIN/NYSTOP) 100000 UNIT/GM POWD Apply 1 g (100,000 Units total) topically 2 (two) times daily. Apply to affected area twice daily  60 g  6  . oxybutynin (DITROPAN-XL) 5 MG 24 hr tablet Take 1 tablet (5 mg total) by mouth at bedtime.  30 tablet  6  . pantoprazole (PROTONIX) 40 MG tablet Take 1 tablet (40 mg total) by mouth daily.  30 tablet  5  . piroxicam (FELDENE) 20 MG capsule Take 1 capsule (20 mg total) by mouth daily.  90 capsule  4  . polyethylene glycol (MIRALAX / GLYCOLAX) packet Take 17 g by mouth daily as needed.  30 each  6  . potassium chloride (KLOR-CON 10) 10 MEQ tablet Take 1 tablet (10 mEq total) by mouth daily.  30 tablet  6   Family History  Problem Relation Age of Onset  . Diabetes Mother   . Heart disease Mother   . Hearing loss Mother   . Stroke Mother   . Schizophrenia Maternal Grandmother    History   Social History  . Marital Status: Widowed    Spouse Name: N/A    Number of Children: N/A  . Years of Education: N/A   Social History Main Topics  . Smoking status: Former Smoker    Quit date: 10/30/1997  . Smokeless tobacco: Never Used  . Alcohol Use: No  . Drug Use: No  . Sexually Active: No   Other Topics Concern  . Not on file   Social History Narrative  . No narrative on file   Review of Systems: A full 12 system ROS is negative except  as noted in the HPI and  A&P.   Objective:  Physical Exam: Filed Vitals:   10/04/12 1325  BP: 122/54  Pulse: 65  Temp: 98.2 F (36.8 C)  TempSrc: Oral  Resp: 20  Height: 5\' 3"  (1.6 m)  Weight: 212 lb 8 oz (96.389 kg)  SpO2: 99%   Constitutional: Vital signs reviewed.  Patient is a well-developed and well-nourished elderly woman in no acute distress and cooperative with exam. Alert and oriented x3.  Head: Normocephalic and atraumatic Ear: TM normal bilaterally Mouth: no erythema or exudates, MMM Eyes: PERRL, EOMI, conjunctivae normal, No scleral icterus.  Neck: Supple, Trachea midline normal ROM, No JVD, mass, thyromegaly, or carotid bruit present.  Cardiovascular: RRR, S1 normal, S2 normal, no MRG, pulses symmetric and intact bilaterally Pulmonary/Chest: CTAB, no wheezes, rales, or rhonchi Abdominal: Soft. Non-tender, non-distended, bowel sounds are normal, no masses, organomegaly, or guarding present.  GU: no CVA tenderness Musculoskeletal: No joint deformities, erythema, or stiffness, ROM full and no nontender Hematology: no cervical, inginal, or axillary adenopathy.  Neurological: A&O x3, Strength is normal and symmetric bilaterally, cranial nerve II-XII are grossly intact, no focal motor deficit, sensory intact to light touch bilaterally.  Skin: Warm, dry and intact. No rash, cyanosis, or clubbing.  Psychiatric: upbeat mood and appropriate affect. speech is mildly pressured and tangential.  She continues to be fixated on religion with a fixed delusion that the devil will get her eventually "I'm an old lady but I wanna live to be 34 but I think he's gonna get me before."   Behavior is bizarre with fixed delusions and hyper-religiousity.  Judgment and insight are poor.  thought content normal. Cognition and memory are normal.   Assessment & Plan:

## 2012-10-08 ENCOUNTER — Telehealth: Payer: Self-pay | Admitting: Licensed Clinical Social Worker

## 2012-10-08 NOTE — Telephone Encounter (Signed)
[name] was referred to CSW to request increase in her personal care hours.  CSW had lengthy conversation with [name], pt states she has had increased weakness and is in need of additional hours.  Pt receives services M/W/F for a total of 8  hrs a week.  Pt states she would like 15-20 hours.  Pt utilizes [facility].  [name] main complaint is that there is limited time for housekeeping due to utilizing hours personal care.  CSW placed call to [facility] and pt's social worker at [employer], Mr. Leonor Liv.  Pt's payor source has limited funding and pt is currently max out for hours.  Pt's case worker is aware of pt's request for additional hours and according to Mr. Mariann Laster there is nothing our office can assist with to increase hours.  CSW will notify [name].

## 2012-10-09 NOTE — Telephone Encounter (Signed)
CSW placed call and notified pt.

## 2012-10-09 NOTE — Telephone Encounter (Signed)
Lengthy conversation with [name].  Pt talked about her multiple physicians, religion, and current [facility] staff.  Pt is pleased with care provided by [name].  And, is following up on recommendations from physicians regarding medications and her night sweats.

## 2012-10-10 NOTE — Telephone Encounter (Signed)
i have spoken to dr buccini's office, they will fax the records of last 2 visits, endo is scheduled for jan 2014

## 2012-10-15 ENCOUNTER — Other Ambulatory Visit: Payer: Self-pay | Admitting: *Deleted

## 2012-10-15 ENCOUNTER — Telehealth: Payer: Self-pay | Admitting: Licensed Clinical Social Worker

## 2012-10-15 NOTE — Telephone Encounter (Signed)
[name] was referred to CSW for home health services.  CSW placed call to [name] to obtain preferred agency.  CSW placed called to [name].  CSW left message requesting return call. CSW provided contact hours and phone number.

## 2012-10-17 NOTE — Telephone Encounter (Signed)
review 

## 2012-10-25 ENCOUNTER — Other Ambulatory Visit: Payer: Self-pay | Admitting: *Deleted

## 2012-10-25 DIAGNOSIS — I10 Essential (primary) hypertension: Secondary | ICD-10-CM

## 2012-10-25 MED ORDER — FUROSEMIDE 20 MG PO TABS: 20.0000 mg | ORAL_TABLET | Freq: Every day | ORAL | Status: DC

## 2012-10-28 ENCOUNTER — Telehealth: Payer: Self-pay | Admitting: *Deleted

## 2012-10-28 NOTE — Telephone Encounter (Signed)
Left message on pt home phone recording [facility] will call pt about appt.

## 2012-10-28 NOTE — Telephone Encounter (Signed)
Pt called asking for a referral to GYN , Dr Comfort at [facility]. She is having hot flashes and has seen her in the past.  She can be seen on 1/13 if we refer. Symptoms include " sweating out her bed every night".  If this is okay please have your nurse call it in.

## 2012-10-28 NOTE — Telephone Encounter (Signed)
Sent.  I will forward this to [facility] for her to follow up on.

## 2012-11-04 ENCOUNTER — Other Ambulatory Visit: Payer: Self-pay | Admitting: *Deleted

## 2012-11-04 DIAGNOSIS — N952 Postmenopausal atrophic vaginitis: Secondary | ICD-10-CM

## 2012-11-04 MED ORDER — ESTROGENS, CONJUGATED 0.625 MG/GM VA CREA: TOPICAL_CREAM | Freq: Every day | VAGINAL | Status: DC

## 2012-11-04 NOTE — Telephone Encounter (Signed)
Smoking 3 pk cig in Dec 2013 - wants Wellbutrin for smoking fax to [facility]. Also wants written Rx mailed to pt for Cetirizine - wants to take to local pharmacy. Stanton Kidney Zayveon Raschke RN 11/04/12 4:30PM

## 2012-11-06 MED ORDER — CETIRIZINE HCL 10 MG PO TABS: 10.0000 mg | ORAL_TABLET | Freq: Every day | ORAL | Status: DC

## 2012-11-06 MED ORDER — BUPROPION HCL ER (SR) 150 MG PO TB12: ORAL_TABLET | ORAL | Status: DC

## 2012-11-06 NOTE — Telephone Encounter (Signed)
Pt aware Zyrtec Rx mailed to pt as instructed. Pt will call and sch appt as directed.

## 2012-11-06 NOTE — Telephone Encounter (Signed)
She will need an appointment in about 3-4 weeks to see how she is doing with the medication.    I'm not sure if her insurance will pay for Zyrtec because it is OTC.

## 2012-11-08 ENCOUNTER — Telehealth: Payer: Self-pay | Admitting: *Deleted

## 2012-11-08 NOTE — Telephone Encounter (Signed)
[name] requests verbal approval for extended PT services for pt, states she is doing well and feels extended PT services will benefit pt, is this ok w/ you?

## 2012-11-08 NOTE — Telephone Encounter (Signed)
Yes that would be great for them to extend her PT.

## 2012-11-11 ENCOUNTER — Encounter: Payer: Self-pay | Admitting: Obstetrics and Gynecology

## 2012-11-11 ENCOUNTER — Ambulatory Visit (INDEPENDENT_AMBULATORY_CARE_PROVIDER_SITE_OTHER): Payer: Medicare Other | Admitting: Obstetrics and Gynecology

## 2012-11-11 VITALS — BP 125/77 | HR 82 | Ht 63.0 in | Wt 208.6 lb

## 2012-11-11 DIAGNOSIS — N951 Menopausal and female climacteric states: Secondary | ICD-10-CM

## 2012-11-11 NOTE — Progress Notes (Signed)
  Subjective:    Patient ID: [name], female    DOB: 1941/06/03, 72 y.o.   MRN: [id number]  HPI 72 yo postmenopausal here for evaluation of vasomotor symptoms. Patient was initially started on vaginal premarin secondary to vaginal atrophy. Patient states it improved her symptoms significantly and is only using it twice a week. Patient started experiencing night sweats and was waking up completely soaked. Patient was taking over the counter black cohosh which seems to provide her with some relief. Patient is otherwise doing well.  Past Medical History  Diagnosis Date  . History of cervical cancer 1972  . Ovarian cancer 1972    S/P oophorectomy  . GERD (gastroesophageal reflux disease)   . Gout   . Hypertension   . DJD (degenerative joint disease), multiple sites     Low back pain worst  . DDD (degenerative disc disease), lumbosacral   . Osteoarthritis of hip     bilateral hips  . Hx-TIA (transient ischemic attack) 05/13/2001    Right facial numbness  . Sensorineural hearing loss of both ears   . Mitral valve prolapse 10/27/1999    Subsequent 2D echo show normal mitral valve  . Chronic venous insufficiency   . Colon, diverticulosis Sept. 2011    outpatient colonoscopy by Dr. [name].  Need record  . Hyperlipidemia   . Recurrent boils     History of MRSA skin infections with abscess  . Gout     Uric Acid level 4.2 on 300 allopurinol  . Depression    Past Surgical History  Procedure Date  . Joint replacement     bilateral hip replacement  . Abdominal hysterectomy     1972  . Oophorectomy     1972 for ovarian cancer  . Lumbar disc surgery     L5-S1 7/07  . Cervical discectomy 7/07    C5-C6   Family History  Problem Relation Age of Onset  . Diabetes Mother   . Heart disease Mother   . Hearing loss Mother   . Stroke Mother   . Schizophrenia Maternal Grandmother    History  Substance Use Topics  . Smoking status: Former Smoker    Quit date: 10/30/1997  .  Smokeless tobacco: Never Used  . Alcohol Use: No      Review of Systems  All other systems reviewed and are negative.       Objective:   Physical Exam GENERAL: Well-developed, well-nourished female in no acute distress.  ABDOMEN: Soft, nontender, nondistended. No organomegaly. EXTREMITIES: No cyanosis, clubbing, or edema, 2+ distal pulses.    Assessment & Plan:  72 yo with vasomotor symptoms - patient advised to resume nightly use of premarin cream until symptoms resolve and to gradually decrease usage until she establishes the frequency of dosage necessary to establish a symptom free state. - RTC prn

## 2012-11-12 ENCOUNTER — Telehealth: Payer: Self-pay | Admitting: *Deleted

## 2012-11-12 NOTE — Telephone Encounter (Signed)
Pt was in agreement to utilize [facility].  Referral completed.  Pt currently receiving services.

## 2012-11-12 NOTE — Telephone Encounter (Signed)
Calls: 1) had an endo dr buccini([facility]), she states he said she checked out fine 2) has been to [facility] for eval and is doing well 3) she started the wellbutrin today

## 2012-11-12 NOTE — Telephone Encounter (Signed)
Excellent.  We'll follow up on everything when she comes for her February appointment.

## 2012-12-13 ENCOUNTER — Encounter: Payer: Self-pay | Admitting: Internal Medicine

## 2012-12-19 ENCOUNTER — Encounter: Payer: Self-pay | Admitting: Internal Medicine

## 2012-12-19 ENCOUNTER — Ambulatory Visit (INDEPENDENT_AMBULATORY_CARE_PROVIDER_SITE_OTHER): Payer: Medicare Other | Admitting: Internal Medicine

## 2012-12-19 VITALS — BP 123/77 | HR 81 | Temp 98.4°F | Wt 206.3 lb

## 2012-12-19 DIAGNOSIS — J069 Acute upper respiratory infection, unspecified: Secondary | ICD-10-CM

## 2012-12-19 DIAGNOSIS — R059 Cough, unspecified: Secondary | ICD-10-CM

## 2012-12-19 DIAGNOSIS — R3989 Other symptoms and signs involving the genitourinary system: Secondary | ICD-10-CM

## 2012-12-19 DIAGNOSIS — R82998 Other abnormal findings in urine: Secondary | ICD-10-CM

## 2012-12-19 DIAGNOSIS — R05 Cough: Secondary | ICD-10-CM

## 2012-12-19 LAB — POCT URINALYSIS DIPSTICK
Bilirubin, UA: NEGATIVE
Blood, UA: NEGATIVE
Glucose, UA: NEGATIVE
Ketones, UA: NEGATIVE
Leukocytes, UA: NEGATIVE
Nitrite, UA: NEGATIVE
Protein, UA: NEGATIVE
Spec Grav, UA: 1.015
Urobilinogen, UA: 0.2
pH, UA: 5

## 2012-12-19 MED ORDER — BENZONATATE 100 MG PO CAPS: 100.0000 mg | ORAL_CAPSULE | Freq: Three times a day (TID) | ORAL | Status: DC | PRN

## 2012-12-19 NOTE — Patient Instructions (Addendum)
General Instructions: -Take Tessalon perls for your cough -Good job trying to quit smoking! -It was great seeing you!

## 2012-12-19 NOTE — Progress Notes (Signed)
  Subjective:    Patient ID: [name], female    DOB: 02/03/1941, 72 y.o.   MRN: [id number]  HPI [name] is a 72 year old woman with PMH of CKD stage 3, hx of ovarian cancer, diastolic CHF, HTN, hot flashes, and GERD who comes in for evaluation of URI symptoms that started 2 weeks ago but are now resolving. She explains that she was very sick last weeks with rhinorrhea, nasal congestion, and cough but could not be seen as the clinic was closed to the inclement weather/snow storm. She states that she has a lingering dry cough that wakes her up at night. She denies fever, chills, decreased intake per mouth, N/V, or diarrhea. She has noticed that her urine is darker than usual but denies dysuria or increased urinary frequency.    Review of Systems  Constitutional: Negative for fever, chills, diaphoresis, activity change, appetite change, fatigue and unexpected weight change.  HENT: Negative for sore throat, voice change and sinus pressure.   Respiratory: Positive for cough. Negative for chest tightness, shortness of breath and wheezing.   Cardiovascular: Negative for chest pain, palpitations and leg swelling.  Gastrointestinal: Negative for abdominal pain.  Genitourinary: Negative for dysuria, frequency, flank pain, decreased urine volume, vaginal bleeding, vaginal discharge, difficulty urinating and vaginal pain.  Musculoskeletal: Negative for myalgias.  Skin: Negative for color change, pallor, rash and wound.  Neurological: Negative for dizziness and headaches.  Hematological: Negative for adenopathy. Does not bruise/bleed easily.  Psychiatric/Behavioral: Negative for behavioral problems and agitation.       Objective:   Physical Exam  Nursing note and vitals reviewed. Constitutional: She is oriented to person, place, and time. No distress.  Obese, in a hurry to leave  HENT:  Nose: Nose normal.  Mouth/Throat: Oropharynx is clear and moist. No oropharyngeal exudate.  Eyes:  Conjunctivae are normal. Right eye exhibits no discharge. Left eye exhibits no discharge. No scleral icterus.  Cardiovascular: Normal rate.   Pulmonary/Chest: Effort normal and breath sounds normal. No respiratory distress. She has no wheezes. She has no rales. She exhibits no tenderness.  Musculoskeletal: She exhibits edema. She exhibits no tenderness.  Pretibial trace pitting edema  Lymphadenopathy:    She has no cervical adenopathy.  Neurological: She is alert and oriented to person, place, and time. Coordination abnormal.  She uses a walker  Skin: Skin is warm and dry. No rash noted. She is not diaphoretic. No erythema. No pallor.  Psychiatric: She has a normal mood and affect.          Assessment & Plan:

## 2012-12-20 ENCOUNTER — Other Ambulatory Visit: Payer: Self-pay | Admitting: *Deleted

## 2012-12-20 DIAGNOSIS — J069 Acute upper respiratory infection, unspecified: Secondary | ICD-10-CM | POA: Insufficient documentation

## 2012-12-20 MED ORDER — POLYETHYLENE GLYCOL 3350 17 G PO PACK: 17.0000 g | PACK | Freq: Every day | ORAL | Status: DC | PRN

## 2012-12-20 NOTE — Assessment & Plan Note (Signed)
Likely viral URI that is now resolving except for a lingering cough that is preventing her from sleeping at night. Given her age and risk for falls will avoid cough suppressant with codeine. I offered OTC cough medicine such as Delsym but she explains that her pharmacy delivers only prescription medications.  -Tessalon perls 100mg  TID PRN for cough.

## 2012-12-21 LAB — URINE CULTURE: Colony Count: >=100000

## 2012-12-24 ENCOUNTER — Other Ambulatory Visit: Payer: Self-pay | Admitting: *Deleted

## 2012-12-25 MED ORDER — BUPROPION HCL ER (SR) 150 MG PO TB12: ORAL_TABLET | ORAL | Status: DC

## 2013-01-01 ENCOUNTER — Encounter: Payer: Self-pay | Admitting: Internal Medicine

## 2013-01-01 ENCOUNTER — Ambulatory Visit (INDEPENDENT_AMBULATORY_CARE_PROVIDER_SITE_OTHER): Payer: Medicare Other | Admitting: Internal Medicine

## 2013-01-01 VITALS — BP 128/75 | HR 76 | Temp 98.2°F | Wt 207.9 lb

## 2013-01-01 DIAGNOSIS — F209 Schizophrenia, unspecified: Secondary | ICD-10-CM

## 2013-01-01 DIAGNOSIS — D638 Anemia in other chronic diseases classified elsewhere: Secondary | ICD-10-CM

## 2013-01-01 DIAGNOSIS — I129 Hypertensive chronic kidney disease with stage 1 through stage 4 chronic kidney disease, or unspecified chronic kidney disease: Secondary | ICD-10-CM

## 2013-01-01 DIAGNOSIS — R059 Cough, unspecified: Secondary | ICD-10-CM

## 2013-01-01 DIAGNOSIS — R05 Cough: Secondary | ICD-10-CM

## 2013-01-01 DIAGNOSIS — F205 Residual schizophrenia: Secondary | ICD-10-CM

## 2013-01-01 DIAGNOSIS — I10 Essential (primary) hypertension: Secondary | ICD-10-CM

## 2013-01-01 DIAGNOSIS — N183 Chronic kidney disease, stage 3 unspecified: Secondary | ICD-10-CM

## 2013-01-01 MED ORDER — LOSARTAN POTASSIUM 100 MG PO TABS: 100.0000 mg | ORAL_TABLET | Freq: Every day | ORAL | Status: DC

## 2013-01-01 MED ORDER — BENZONATATE 100 MG PO CAPS: 100.0000 mg | ORAL_CAPSULE | Freq: Three times a day (TID) | ORAL | Status: DC | PRN

## 2013-01-01 NOTE — Patient Instructions (Signed)
1.  Stop the Lisinopril  2.  Start Losartan 100 mg.  Take 1 tablet daily for your blood pressure.  3.  Continue your other medications as prescribed.  4.  Follow up at the end of March to recheck your blood pressure.

## 2013-01-01 NOTE — Progress Notes (Signed)
Subjective:   [name]   DOB: 1941-10-27 72 y.o.   MRN: [id number]  HPI: [name] is a 72 y.o. woman who presents to clinic today for follow up on her chronic medical conditions including schizophrenia, hypertension, CKD, and anemia.  See Problem focused Assessment and Plan for full details of her chronic medical conditions.   Past Medical History  Diagnosis Date  . History of cervical cancer 1972  . Ovarian cancer 1972    S/P oophorectomy  . GERD (gastroesophageal reflux disease)   . Gout   . Hypertension   . DJD (degenerative joint disease), multiple sites     Low back pain worst  . DDD (degenerative disc disease), lumbosacral   . Osteoarthritis of hip     bilateral hips  . Hx-TIA (transient ischemic attack) 05/13/2001    Right facial numbness  . Sensorineural hearing loss of both ears   . Mitral valve prolapse 10/27/1999    Subsequent 2D echo show normal mitral valve  . Chronic venous insufficiency   . Colon, diverticulosis Sept. 2011    outpatient colonoscopy by Dr. [name].  Need record  . Hyperlipidemia   . Recurrent boils     History of MRSA skin infections with abscess  . Gout     Uric Acid level 4.2 on 300 allopurinol  . Depression    Current Outpatient Prescriptions  Medication Sig Dispense Refill  . allopurinol (ZYLOPRIM) 300 MG tablet Take 1 tablet (300 mg total) by mouth daily.  30 tablet  6  . amLODipine (NORVASC) 5 MG tablet Take 1 tablet (5 mg total) by mouth daily.  90 tablet  3  . Apple Cider Vinegar 300 MG TABS Take 1 tablet by mouth daily.        . Ascorbic Acid (VITAMIN C) 500 MG tablet Take 500 mg by mouth daily.        [name] atorvastatin (LIPITOR) 80 MG tablet Take 1 tablet (80 mg total) by mouth daily.  30 tablet  6  . benzocaine-Menthol (DERMOPLAST) 20-0.5 % AERO Apply 1 application topically 3 (three) times daily as needed. Apply to back as needed for itching and skin irritation       . benzonatate (TESSALON  PERLES) 100 MG capsule Take 1 capsule (100 mg total) by mouth 3 (three) times daily as needed for cough.  30 capsule  1  . benztropine (COGENTIN) 0.5 MG tablet Take 1 tablet (0.5 mg total) by mouth daily.  90 tablet  0  . Biotin (BIOTIN 5000) 5 MG CAPS Take 1 capsule by mouth daily.        [name] buPROPion (WELLBUTRIN SR) 150 MG 12 hr tablet Take 1 tablet daily for 3 days then 1 tablet twice daily until your follow up  60 tablet  3  . Calcium Carbonate-Vitamin D (CALCIUM-VITAMIN D) 500-200 MG-UNIT per tablet Take 1 tablet by mouth daily.        . cetirizine (ZYRTEC) 10 MG tablet Take 1 tablet (10 mg total) by mouth daily.  30 tablet  6  . clopidogrel (PLAVIX) 75 MG tablet Take 1 tablet (75 mg total) by mouth daily.  90 tablet  3  . clotrimazole (GYNE-LOTRIMIN) 1 % vaginal cream Place 1 Applicatorful vaginally 2 (two) times daily.  45 g  2  . conjugated estrogens (PREMARIN) vaginal cream Place vaginally at bedtime.  60 g  12  . diphenoxylate-atropine (LOMOTIL) 2.5-0.025 MG per tablet Take 1 tablet by mouth 4 (  four) times daily as needed for diarrhea or loose stools.  30 tablet  1  . Elastic Bandages & Supports (T.E.D. KNEE LENGTH/L-REGULAR) MISC 1 Units by Does not apply route daily.        . fluocinonide cream (LIDEX) 0.05 % Apply 1 application topically 2 (two) times daily.  60 g  1  . fluticasone (FLONASE) 50 MCG/ACT nasal spray Place 2 sprays into the nose daily.  16 g  6  . furosemide (LASIX) 20 MG tablet Take 1 tablet (20 mg total) by mouth daily. Okay to use one more tablet daily for increased swelling as needed.  45 tablet  3  . lisinopril (PRINIVIL,ZESTRIL) 40 MG tablet Take 1 tablet (40 mg total) by mouth daily.  90 tablet  3  . loratadine (CLARITIN) 10 MG tablet Take 1 tablet (10 mg total) by mouth daily.  30 tablet  1  . nystatin (MYCOSTATIN/NYSTOP) 100000 UNIT/GM POWD Apply 1 g (100,000 Units total) topically 2 (two) times daily. Apply to affected area twice daily  60 g  6  . oxybutynin  (DITROPAN-XL) 5 MG 24 hr tablet Take 1 tablet (5 mg total) by mouth at bedtime.  30 tablet  6  . pantoprazole (PROTONIX) 40 MG tablet Take 1 tablet (40 mg total) by mouth daily.  30 tablet  5  . piroxicam (FELDENE) 20 MG capsule Take 1 capsule (20 mg total) by mouth daily.  90 capsule  4  . polyethylene glycol (MIRALAX / GLYCOLAX) packet Take 17 g by mouth daily as needed.  30 each  6  . potassium chloride (KLOR-CON 10) 10 MEQ tablet Take 1 tablet (10 mEq total) by mouth daily.  30 tablet  6  . zoster vaccine live, PF, (ZOSTAVAX) 16109 UNT/0.65ML injection Inject 19,400 Units into the skin once.  1 each  0   No current facility-administered medications for this visit.   Family History  Problem Relation Age of Onset  . Diabetes Mother   . Heart disease Mother   . Hearing loss Mother   . Stroke Mother   . Schizophrenia Maternal Grandmother    History   Social History  . Marital Status: Widowed    Spouse Name: N/A    Number of Children: N/A  . Years of Education: N/A   Social History Main Topics  . Smoking status: Current Every Day Smoker -- 0.10 packs/day    Types: Cigarettes    Last Attempt to Quit: 10/30/1997  . Smokeless tobacco: Never Used     Comment: pt has started back smoking.  Does 2-4 cigs per day  . Alcohol Use: No  . Drug Use: No  . Sexually Active: No   Other Topics Concern  . None   Social History Narrative  . None   Review of Systems: A full 12 system ROS is negative except as noted in the HPI and A&P.   Objective:  Physical Exam: Filed Vitals:   01/01/13 1403  BP: 128/75  Pulse: 76  Temp: 98.2 F (36.8 C)  TempSrc: Oral  Weight: 207 lb 14.4 oz (94.303 kg)  SpO2: 97%   Constitutional: Vital signs reviewed.  Patient is a well-developed and well-nourished elderly woman in no acute distress and cooperative with exam. Alert and oriented x3.  Head: Normocephalic and atraumatic Ear: TM normal bilaterally Mouth: no erythema or exudates, MMM Eyes:  PERRL, EOMI, conjunctivae normal, No scleral icterus.  Neck: Supple, Trachea midline normal ROM, No JVD, mass, thyromegaly, or carotid bruit  present.  Cardiovascular: RRR, S1 normal, S2 normal, no MRG, pulses symmetric and intact bilaterally Pulmonary/Chest: CTAB, no wheezes, rales, or rhonchi Abdominal: Soft. Non-tender, non-distended, bowel sounds are normal, no masses, organomegaly, or guarding present.  GU: no CVA tenderness Musculoskeletal: No joint deformities, erythema, or stiffness, ROM full and no nontender Hematology: no cervical, inginal, or axillary adenopathy.  Neurological: A&O x3, Strength is normal and symmetric bilaterally, cranial nerve II-XII are grossly intact, no focal motor deficit, sensory intact to light touch bilaterally.  Skin: Warm, dry and intact. No rash, cyanosis, or clubbing.  Psychiatric: Normal mood and labile affect. speech is mildly pressured and tangential.  Behavior shows delusions of religion and persecution.  Judgment and insight are poor.  and thought content normal. Cognition and memory are normal.   Assessment & Plan:

## 2013-01-02 ENCOUNTER — Other Ambulatory Visit: Payer: Self-pay | Admitting: Internal Medicine

## 2013-01-06 ENCOUNTER — Telehealth: Payer: Self-pay | Admitting: Licensed Clinical Social Worker

## 2013-01-06 NOTE — Telephone Encounter (Signed)
Pt had inquired to PCP about a lift chair.  CSW placed call to [employer].  Lift chairs are a private pay item.  [facility] would do a Research scientist (life sciences), but pt would have to pay upfront.  Generally, insurance cover motor only if anything at all.  CSW placed call to pt and left message indicating the above.

## 2013-01-09 NOTE — Telephone Encounter (Signed)
Ms. Hallisey left voice mail for CSW providing UHC/Medicare provider for Lift Chair: [employer], [place], [phone].  CSW placed call and spoke with Cordelia Pen.  Per agency, since Ms. Gramm utilized Community education officer for [employer], non-ambulatory item, Ms. Hopf can not utilize insurance for an ambulatory item, Retail buyer.  CSW attempted to call pt and inform, no answer. Pt called CSW.  CSW attempt to explain above to pt.  Pt states how grateful she is for care provided by PCP. CSW referred pt to Cordelia Pen at [facility] professionals to explain to patient.  CSW attempted to transfer call per pt request.  Pt aware call may be disconnect, but pt does have the number.

## 2013-01-22 ENCOUNTER — Ambulatory Visit: Payer: Medicare Other | Admitting: Internal Medicine

## 2013-01-24 ENCOUNTER — Other Ambulatory Visit: Payer: Self-pay | Admitting: *Deleted

## 2013-01-24 ENCOUNTER — Telehealth: Payer: Self-pay | Admitting: *Deleted

## 2013-01-24 NOTE — Telephone Encounter (Signed)
She was only switched to the liquid because her pharmacy stated that the pill was contraindicated with one of her medications.  On reviewing her extensive medication list i don't see anything that would stop her from taking the pill.  If they won't do the liquid we will work to get her going on a tablet.  She should be taking 10 mEq daily.

## 2013-01-24 NOTE — Telephone Encounter (Signed)
Received PA request from [facility] [phone] for pt's potassium chloride 10% liq (5meq/15ml).  Contacted pt's insurance at [phone], this medication is "completed excluded" from their formulary and will not be covered under the pt's Part D plan.  [facility] also stated that pt and/or MD could appeal the decision, but more than likely insurance will not pay for it .  Faxed denial will be faxed to our office and a letter will be mailed to pt with both stating how to appeal this decision.  Will forward denial to MD for review.  Also attempted to contact pt by phone, no answer, message left on recorder.Kingsley Spittle Cassady3/28/20143:04 PM

## 2013-01-27 ENCOUNTER — Encounter: Payer: Self-pay | Admitting: Internal Medicine

## 2013-01-27 ENCOUNTER — Ambulatory Visit (INDEPENDENT_AMBULATORY_CARE_PROVIDER_SITE_OTHER): Payer: Medicare Other | Admitting: Internal Medicine

## 2013-01-27 VITALS — BP 131/78 | HR 75 | Temp 98.2°F | Ht 63.0 in | Wt 206.2 lb

## 2013-01-27 DIAGNOSIS — R059 Cough, unspecified: Secondary | ICD-10-CM

## 2013-01-27 DIAGNOSIS — R05 Cough: Secondary | ICD-10-CM

## 2013-01-27 DIAGNOSIS — I10 Essential (primary) hypertension: Secondary | ICD-10-CM

## 2013-01-27 NOTE — Progress Notes (Signed)
Subjective:   [name]   DOB: [birth year] 72 y.o.   MRN: [id number]  HPI: [name] is a 72 y.o. woman presents to clinic today for follow up from her last appointment.  At her last appointment she was changed from lisinopril to losartan.  She states that she just had the medication delivered 3 days ago and started taking it.  She states that the cough is "no better."    Past Medical History  Diagnosis Date  . History of cervical cancer 1972  . Ovarian cancer 1972    S/P oophorectomy  . GERD (gastroesophageal reflux disease)   . Gout   . Hypertension   . DJD (degenerative joint disease), multiple sites     Low back pain worst  . DDD (degenerative disc disease), lumbosacral   . Osteoarthritis of hip     bilateral hips  . Hx-TIA (transient ischemic attack) 05/13/2001    Right facial numbness  . Sensorineural hearing loss of both ears   . Mitral valve prolapse 10/27/1999    Subsequent 2D echo show normal mitral valve  . Chronic venous insufficiency   . Colon, diverticulosis Sept. 2011    outpatient colonoscopy by Dr. [name].  Need record  . Hyperlipidemia   . Recurrent boils     History of MRSA skin infections with abscess  . Gout     Uric Acid level 4.2 on 300 allopurinol  . Depression    Current Outpatient Prescriptions  Medication Sig Dispense Refill  . allopurinol (ZYLOPRIM) 300 MG tablet Take 1 tablet (300 mg total) by mouth daily.  30 tablet  6  . amLODipine (NORVASC) 5 MG tablet Take 1 tablet (5 mg total) by mouth daily.  90 tablet  3  . Apple Cider Vinegar 300 MG TABS Take 1 tablet by mouth daily.        . Ascorbic Acid (VITAMIN C) 500 MG tablet Take 500 mg by mouth daily.        [name] atorvastatin (LIPITOR) 80 MG tablet Take 1 tablet (80 mg total) by mouth daily.  30 tablet  6  . benzocaine-Menthol (DERMOPLAST) 20-0.5 % AERO Apply 1 application topically 3 (three) times daily as needed. Apply to back as needed for itching and skin  irritation       . benzonatate (TESSALON PERLES) 100 MG capsule Take 1 capsule (100 mg total) by mouth 3 (three) times daily as needed for cough.  30 capsule  1  . benztropine (COGENTIN) 0.5 MG tablet Take 1 tablet (0.5 mg total) by mouth daily.  90 tablet  0  . Biotin (BIOTIN 5000) 5 MG CAPS Take 1 capsule by mouth daily.        [name] buPROPion (WELLBUTRIN SR) 150 MG 12 hr tablet Take 1 tablet daily for 3 days then 1 tablet twice daily until your follow up  60 tablet  3  . Calcium Carbonate-Vitamin D (CALCIUM-VITAMIN D) 500-200 MG-UNIT per tablet Take 1 tablet by mouth daily.        . cetirizine (ZYRTEC) 10 MG tablet Take 1 tablet (10 mg total) by mouth daily.  30 tablet  6  . clopidogrel (PLAVIX) 75 MG tablet Take 1 tablet (75 mg total) by mouth daily.  90 tablet  3  . clotrimazole (GYNE-LOTRIMIN) 1 % vaginal cream Place 1 Applicatorful vaginally 2 (two) times daily.  45 g  2  . conjugated estrogens (PREMARIN) vaginal cream Place vaginally at bedtime.  60 g  12  . diphenoxylate-atropine (LOMOTIL) 2.5-0.025 MG per tablet Take 1 tablet by mouth 4 (four) times daily as needed for diarrhea or loose stools.  30 tablet  1  . Elastic Bandages & Supports (T.E.D. KNEE LENGTH/L-REGULAR) MISC 1 Units by Does not apply route daily.        . fluocinonide cream (LIDEX) 0.05 % Apply 1 application topically 2 (two) times daily.  60 g  1  . fluticasone (FLONASE) 50 MCG/ACT nasal spray Place 2 sprays into the nose daily.  16 g  6  . furosemide (LASIX) 20 MG tablet Take 1 tablet (20 mg total) by mouth daily. Okay to use one more tablet daily for increased swelling as needed.  45 tablet  3  . loratadine (CLARITIN) 10 MG tablet Take 1 tablet (10 mg total) by mouth daily.  30 tablet  1  . losartan (COZAAR) 100 MG tablet Take 1 tablet (100 mg total) by mouth daily.  30 tablet  6  . nystatin (MYCOSTATIN/NYSTOP) 100000 UNIT/GM POWD Apply 1 g (100,000 Units total) topically 2 (two) times daily. Apply to affected area twice  daily  60 g  6  . oxybutynin (DITROPAN-XL) 5 MG 24 hr tablet Take 1 tablet (5 mg total) by mouth at bedtime.  30 tablet  6  . pantoprazole (PROTONIX) 40 MG tablet Take 1 tablet (40 mg total) by mouth daily.  30 tablet  5  . piroxicam (FELDENE) 20 MG capsule Take 1 capsule (20 mg total) by mouth daily.  90 capsule  4  . polyethylene glycol (MIRALAX / GLYCOLAX) packet Take 17 g by mouth daily as needed.  30 each  6  . potassium chloride (KLOR-CON 10) 10 MEQ tablet Take 1 tablet (10 mEq total) by mouth daily.  30 tablet  6   No current facility-administered medications for this visit.   Family History  Problem Relation Age of Onset  . Diabetes Mother   . Heart disease Mother   . Hearing loss Mother   . Stroke Mother   . Schizophrenia Maternal Grandmother    History   Social History  . Marital Status: Widowed    Spouse Name: N/A    Number of Children: N/A  . Years of Education: N/A   Social History Main Topics  . Smoking status: Current Every Day Smoker -- 0.10 packs/day    Types: Cigarettes    Last Attempt to Quit: 10/30/1997  . Smokeless tobacco: Never Used     Comment: pt has started back smoking.  Does 2-4 cigs per day  . Alcohol Use: No  . Drug Use: No  . Sexually Active: No   Other Topics Concern  . None   Social History Narrative  . None   Review of Systems: A full 12 system ROS is negative except as noted in the HPI and A&P.   Objective:  Physical Exam: Filed Vitals:   01/27/13 1130  BP: 131/78  Pulse: 75  Temp: 98.2 F (36.8 C)  TempSrc: Oral  Height: 5\' 3"  (1.6 m)  Weight: 206 lb 3.2 oz (93.532 kg)  SpO2: 97%   Constitutional: Vital signs reviewed.  Patient is a well-developed and well-nourished elderly woman in no acute distress and cooperative with exam. Alert and oriented x3.  Head: Normocephalic and atraumatic Ear: TM normal bilaterally Mouth: no erythema or exudates, MMM Eyes: PERRL, EOMI, conjunctivae normal, No scleral icterus.  Neck: Supple,  Trachea midline normal ROM, No JVD, mass, thyromegaly, or carotid bruit present.  Cardiovascular: RRR, S1 normal, S2 normal, no MRG, pulses symmetric and intact bilaterally Pulmonary/Chest: CTAB, no wheezes, rales, or rhonchi Abdominal: Soft. Non-tender, non-distended, bowel sounds are normal, no masses, organomegaly, or guarding present.  GU: no CVA tenderness Musculoskeletal: No joint deformities, erythema, or stiffness, ROM full and no nontender Hematology: no cervical, inginal, or axillary adenopathy.  Neurological: A&O x3, Strength is normal and symmetric bilaterally, cranial nerve II-XII are grossly intact, no focal motor deficit, sensory intact to light touch bilaterally.  Skin: Warm, dry and intact. No rash, cyanosis, or clubbing.  Psychiatric: Normal mood and affect. Speech is tangential and mildly pressured.  behavior is delusional and hyper religious.  Judgment and insight appear poor.  thought content normal. Cognition and memory are normal.   Assessment & Plan:

## 2013-01-27 NOTE — Progress Notes (Signed)
A user error has taken place: encounter opened in error, closed for administrative reasons.[name], [name]3/31/20141:36 PM .

## 2013-01-27 NOTE — Patient Instructions (Signed)
1.  Continue your medications as prescribed.  2.  The cough will take about 4-6 weeks to get better after stopping that medication.  3.  Follow up with me in May.

## 2013-01-28 NOTE — Telephone Encounter (Signed)
PPA called and were informed of PA decision and to continue tablets

## 2013-01-28 NOTE — Telephone Encounter (Signed)
Request was denied, both pt and MD aware, pt to continue taking pills,  phone call complete.[name], Shelby Hackleman Cassady4/1/201411:17 AM

## 2013-01-29 ENCOUNTER — Other Ambulatory Visit: Payer: Self-pay | Admitting: *Deleted

## 2013-01-29 DIAGNOSIS — B3789 Other sites of candidiasis: Secondary | ICD-10-CM

## 2013-01-29 MED ORDER — OXYBUTYNIN CHLORIDE ER 5 MG PO TB24: 5.0000 mg | ORAL_TABLET | Freq: Every day | ORAL | Status: DC

## 2013-01-29 MED ORDER — PIROXICAM 20 MG PO CAPS: 20.0000 mg | ORAL_CAPSULE | Freq: Every day | ORAL | Status: DC

## 2013-01-30 ENCOUNTER — Other Ambulatory Visit: Payer: Self-pay | Admitting: *Deleted

## 2013-01-30 MED ORDER — TRAMADOL HCL 50 MG PO TABS: 50.0000 mg | ORAL_TABLET | Freq: Two times a day (BID) | ORAL | Status: DC

## 2013-01-30 NOTE — Telephone Encounter (Signed)
I will fill it for now until her follow up appointment but we will need to discuss the long term need for pain medication since she is also on the Piroxicam that Dr. Ophelia Charter and put her on as well.

## 2013-01-30 NOTE — Telephone Encounter (Signed)
Rx called in as ordered # 30

## 2013-01-30 NOTE — Telephone Encounter (Signed)
Pt called and states she will always need to receive this med.  She no longer goes to Dr Ophelia Charter.  He was giving her 3 pills since hip surgery, she will bring all Rx's with her next visit.

## 2013-02-24 ENCOUNTER — Other Ambulatory Visit: Payer: Self-pay | Admitting: Internal Medicine

## 2013-02-25 ENCOUNTER — Other Ambulatory Visit: Payer: Self-pay | Admitting: *Deleted

## 2013-02-25 DIAGNOSIS — M109 Gout, unspecified: Secondary | ICD-10-CM

## 2013-02-25 DIAGNOSIS — L259 Unspecified contact dermatitis, unspecified cause: Secondary | ICD-10-CM

## 2013-02-25 DIAGNOSIS — R05 Cough: Secondary | ICD-10-CM

## 2013-02-25 DIAGNOSIS — I10 Essential (primary) hypertension: Secondary | ICD-10-CM

## 2013-02-25 DIAGNOSIS — R059 Cough, unspecified: Secondary | ICD-10-CM

## 2013-02-25 MED ORDER — ATORVASTATIN CALCIUM 80 MG PO TABS: 80.0000 mg | ORAL_TABLET | Freq: Every day | ORAL | Status: DC

## 2013-02-25 MED ORDER — ALLOPURINOL 300 MG PO TABS: 300.0000 mg | ORAL_TABLET | Freq: Every day | ORAL | Status: DC

## 2013-02-25 MED ORDER — BENZONATATE 100 MG PO CAPS: 100.0000 mg | ORAL_CAPSULE | Freq: Three times a day (TID) | ORAL | Status: DC | PRN

## 2013-02-25 MED ORDER — POTASSIUM CHLORIDE ER 10 MEQ PO TBCR: 10.0000 meq | EXTENDED_RELEASE_TABLET | Freq: Every day | ORAL | Status: DC

## 2013-02-25 MED ORDER — FUROSEMIDE 20 MG PO TABS: 20.0000 mg | ORAL_TABLET | Freq: Every day | ORAL | Status: DC

## 2013-02-25 MED ORDER — CLOPIDOGREL BISULFATE 75 MG PO TABS: 75.0000 mg | ORAL_TABLET | Freq: Every day | ORAL | Status: DC

## 2013-02-25 MED ORDER — FLUOCINONIDE 0.05 % EX CREA: 1.0000 "application " | TOPICAL_CREAM | Freq: Two times a day (BID) | CUTANEOUS | Status: DC

## 2013-03-07 ENCOUNTER — Other Ambulatory Visit: Payer: Self-pay | Admitting: Internal Medicine

## 2013-03-14 ENCOUNTER — Ambulatory Visit (INDEPENDENT_AMBULATORY_CARE_PROVIDER_SITE_OTHER): Payer: Medicare Other | Admitting: Internal Medicine

## 2013-03-14 ENCOUNTER — Encounter: Payer: Self-pay | Admitting: Internal Medicine

## 2013-03-14 VITALS — BP 130/89 | HR 67 | Temp 97.2°F | Ht 63.5 in | Wt 203.6 lb

## 2013-03-14 DIAGNOSIS — I10 Essential (primary) hypertension: Secondary | ICD-10-CM

## 2013-03-14 DIAGNOSIS — D638 Anemia in other chronic diseases classified elsewhere: Secondary | ICD-10-CM

## 2013-03-14 DIAGNOSIS — N183 Chronic kidney disease, stage 3 unspecified: Secondary | ICD-10-CM

## 2013-03-14 DIAGNOSIS — M25559 Pain in unspecified hip: Secondary | ICD-10-CM

## 2013-03-14 LAB — CBC
HCT: 35.7 % — ABNORMAL LOW (ref 36.0–46.0)
Hemoglobin: 12 g/dL (ref 12.0–15.0)
MCH: 30.8 pg (ref 26.0–34.0)
MCHC: 33.6 g/dL (ref 30.0–36.0)
MCV: 91.5 fL (ref 78.0–100.0)
Platelets: 331 10*3/uL (ref 150–400)
RBC: 3.9 MIL/uL (ref 3.87–5.11)
RDW: 16.9 % — ABNORMAL HIGH (ref 11.5–15.5)
WBC: 9.2 10*3/uL (ref 4.0–10.5)

## 2013-03-14 LAB — COMPREHENSIVE METABOLIC PANEL
ALT: 14 U/L (ref 0–35)
AST: 18 U/L (ref 0–37)
Albumin: 4.1 g/dL (ref 3.5–5.2)
Alkaline Phosphatase: 118 U/L — ABNORMAL HIGH (ref 39–117)
BUN: 23 mg/dL (ref 6–23)
CO2: 25 mEq/L (ref 19–32)
Calcium: 10 mg/dL (ref 8.4–10.5)
Chloride: 105 mEq/L (ref 96–112)
Creat: 1.54 mg/dL — ABNORMAL HIGH (ref 0.50–1.10)
Glucose, Bld: 92 mg/dL (ref 70–99)
Potassium: 3.9 mEq/L (ref 3.5–5.3)
Sodium: 140 mEq/L (ref 135–145)
Total Bilirubin: 0.4 mg/dL (ref 0.3–1.2)
Total Protein: 6.7 g/dL (ref 6.0–8.3)

## 2013-03-14 MED ORDER — TRAMADOL HCL 50 MG PO TABS: 50.0000 mg | ORAL_TABLET | Freq: Two times a day (BID) | ORAL | Status: DC

## 2013-03-14 NOTE — Patient Instructions (Signed)
1.  Stop the Piroxicam!  2.  Use the tramadol for your pain.  3.  Continue your other medications as prescribed.  4.  Follow up in July and August to meet your new PCP  5.  It has been an absolute pleasure caring for your over the last 3 years.  I wish you all the best in the future.

## 2013-03-14 NOTE — Progress Notes (Deleted)
Subjective:   [name]   DOB: 10/26/1941 72 y.o.   MRN: [id number]  HPI: [name] is a 72 y.o. woman who presents to clinic today for follow up on her chronic medical problems including hypertension, anemia, hip pain, CKD stage III, and Allergic Rhinitis. See Problem focused Assessment and Plan for full details of her chronic medical conditions.   Cbc, bmet  Past Medical History  Diagnosis Date  . History of cervical cancer 1972  . Ovarian cancer 1972    S/P oophorectomy  . GERD (gastroesophageal reflux disease)   . Gout   . Hypertension   . DJD (degenerative joint disease), multiple sites     Low back pain worst  . DDD (degenerative disc disease), lumbosacral   . Osteoarthritis of hip     bilateral hips  . Hx-TIA (transient ischemic attack) 05/13/2001    Right facial numbness  . Sensorineural hearing loss of both ears   . Mitral valve prolapse 10/27/1999    Subsequent 2D echo show normal mitral valve  . Chronic venous insufficiency   . Colon, diverticulosis Sept. 2011    outpatient colonoscopy by Dr. [name].  Need record  . Hyperlipidemia   . Recurrent boils     History of MRSA skin infections with abscess  . Gout     Uric Acid level 4.2 on 300 allopurinol  . Depression    Current Outpatient Prescriptions  Medication Sig Dispense Refill  . allopurinol (ZYLOPRIM) 300 MG tablet Take 1 tablet (300 mg total) by mouth daily.  30 tablet  6  . amLODipine (NORVASC) 5 MG tablet Take 1 tablet (5 mg total) by mouth daily.  90 tablet  3  . Apple Cider Vinegar 300 MG TABS Take 1 tablet by mouth daily.        . Ascorbic Acid (VITAMIN C) 500 MG tablet Take 500 mg by mouth daily.        [name] atorvastatin (LIPITOR) 80 MG tablet Take 1 tablet (80 mg total) by mouth daily.  30 tablet  6  . benzocaine-Menthol (DERMOPLAST) 20-0.5 % AERO Apply 1 application topically 3 (three) times daily as needed. Apply to back as needed for itching and skin irritation        . benzonatate (TESSALON PERLES) 100 MG capsule Take 1 capsule (100 mg total) by mouth 3 (three) times daily as needed for cough.  30 capsule  1  . benztropine (COGENTIN) 0.5 MG tablet Take 1 tablet (0.5 mg total) by mouth daily.  90 tablet  0  . Biotin (BIOTIN 5000) 5 MG CAPS Take 1 capsule by mouth daily.        [name] buPROPion (WELLBUTRIN SR) 150 MG 12 hr tablet Take 1 tablet daily for 3 days then 1 tablet twice daily until your follow up  60 tablet  3  . Calcium Carbonate-Vitamin D (CALCIUM-VITAMIN D) 500-200 MG-UNIT per tablet Take 1 tablet by mouth daily.        . cetirizine (ZYRTEC) 10 MG tablet Take 1 tablet (10 mg total) by mouth daily.  30 tablet  6  . clopidogrel (PLAVIX) 75 MG tablet Take 1 tablet (75 mg total) by mouth daily.  90 tablet  3  . clotrimazole (GYNE-LOTRIMIN) 1 % vaginal cream Place 1 Applicatorful vaginally 2 (two) times daily.  45 g  2  . conjugated estrogens (PREMARIN) vaginal cream Place vaginally at bedtime.  60 g  12  . diphenoxylate-atropine (LOMOTIL) 2.5-0.025 MG per  tablet Take 1 tablet by mouth 4 (four) times daily as needed for diarrhea or loose stools.  30 tablet  1  . Elastic Bandages & Supports (T.E.D. KNEE LENGTH/L-REGULAR) MISC 1 Units by Does not apply route daily.        . fluocinonide cream (LIDEX) 0.05 % Apply 1 application topically 2 (two) times daily.  60 g  1  . fluticasone (FLONASE) 50 MCG/ACT nasal spray Place 2 sprays into the nose daily.  16 g  6  . furosemide (LASIX) 20 MG tablet Take 1 tablet (20 mg total) by mouth daily. Okay to use one more tablet daily for increased swelling as needed.  45 tablet  3  . loratadine (CLARITIN) 10 MG tablet Take 1 tablet (10 mg total) by mouth daily.  30 tablet  1  . losartan (COZAAR) 100 MG tablet Take 1 tablet (100 mg total) by mouth daily.  30 tablet  6  . nystatin (MYCOSTATIN/NYSTOP) 100000 UNIT/GM POWD Apply 1 g (100,000 Units total) topically 2 (two) times daily. Apply to affected area twice daily  60 g  6  .  oxybutynin (DITROPAN-XL) 5 MG 24 hr tablet Take 1 tablet (5 mg total) by mouth at bedtime.  30 tablet  3  . pantoprazole (PROTONIX) 40 MG tablet Take 1 tablet (40 mg total) by mouth daily.  30 tablet  6  . piroxicam (FELDENE) 20 MG capsule Take 1 capsule (20 mg total) by mouth daily.  90 capsule  1  . polyethylene glycol (MIRALAX / GLYCOLAX) packet Take 17 g by mouth daily as needed.  30 each  6  . potassium chloride (KLOR-CON 10) 10 MEQ tablet Take 1 tablet (10 mEq total) by mouth daily.  30 tablet  6  . traMADol (ULTRAM) 50 MG tablet Take 1 tablet (50 mg total) by mouth 2 (two) times daily.  30 tablet  3  . [DISCONTINUED] oxybutynin (DITROPAN-XL) 5 MG 24 hr tablet Take 1 tablet (5 mg total) by mouth at bedtime.  30 tablet  6   No current facility-administered medications for this visit.   Family History  Problem Relation Age of Onset  . Diabetes Mother   . Heart disease Mother   . Hearing loss Mother   . Stroke Mother   . Schizophrenia Maternal Grandmother    History   Social History  . Marital Status: Widowed    Spouse Name: N/A    Number of Children: N/A  . Years of Education: N/A   Social History Main Topics  . Smoking status: Current Every Day Smoker -- 0.10 packs/day    Types: Cigarettes    Last Attempt to Quit: 10/30/1997  . Smokeless tobacco: Never Used     Comment: pt has started back smoking.  Does 2-4 cigs per day  . Alcohol Use: No  . Drug Use: No  . Sexually Active: No   Other Topics Concern  . None   Social History Narrative  . None   Review of Systems: Constitutional: Denies fever, chills, diaphoresis, appetite change and fatigue.  HEENT: Denies photophobia, eye pain, redness, hearing loss, ear pain, congestion, sore throat, rhinorrhea, sneezing, mouth sores, trouble swallowing, neck pain, neck stiffness and tinnitus.   Respiratory: Denies SOB, DOE, cough, chest tightness,  and wheezing.   Cardiovascular: Denies chest pain, palpitations and leg swelling.   Gastrointestinal: Denies nausea, vomiting, abdominal pain, diarrhea, constipation, blood in stool and abdominal distention.  Genitourinary: Denies dysuria, urgency, frequency, hematuria, flank pain and difficulty  urinating.  Endocrine: Denies: hot or cold intolerance, sweats, changes in hair or nails, polyuria, polydipsia. Musculoskeletal: Denies myalgias, back pain, joint swelling, arthralgias and gait problem.  Skin: Denies pallor, rash and wound.  Neurological: Denies dizziness, seizures, syncope, weakness, light-headedness, numbness and headaches.  Hematological: Denies adenopathy. Easy bruising, personal or family bleeding history  Psychiatric/Behavioral: Denies suicidal ideation, mood changes, confusion, nervousness, sleep disturbance and agitation  Objective:  Physical Exam: Filed Vitals:   03/14/13 1615  BP: 130/89  Pulse: 67  Temp: 97.2 F (36.2 C)  TempSrc: Oral  Height: 5' 3.5" (1.613 m)  Weight: 203 lb 9.6 oz (92.352 kg)  SpO2: 100%   Constitutional: Vital signs reviewed.  Patient is a well-developed and well-nourished *** in no acute distress and cooperative with exam. Alert and oriented x3.  Head: Normocephalic and atraumatic Ear: TM normal bilaterally Mouth: no erythema or exudates, MMM Eyes: PERRL, EOMI, conjunctivae normal, No scleral icterus.  Neck: Supple, Trachea midline normal ROM, No JVD, mass, thyromegaly, or carotid bruit present.  Cardiovascular: RRR, S1 normal, S2 normal, no MRG, pulses symmetric and intact bilaterally Pulmonary/Chest: CTAB, no wheezes, rales, or rhonchi Abdominal: Soft. Non-tender, non-distended, bowel sounds are normal, no masses, organomegaly, or guarding present.  GU: no CVA tenderness Musculoskeletal: No joint deformities, erythema, or stiffness, ROM full and no nontender Hematology: no cervical, inginal, or axillary adenopathy.  Neurological: A&O x3, Strength is normal and symmetric bilaterally, cranial nerve II-XII are grossly  intact, no focal motor deficit, sensory intact to light touch bilaterally.  Skin: Warm, dry and intact. No rash, cyanosis, or clubbing.  Psychiatric: Normal mood and affect. speech and behavior is normal. Judgment and thought content normal. Cognition and memory are normal.   Assessment & Plan:

## 2013-03-20 ENCOUNTER — Telehealth: Payer: Self-pay | Admitting: *Deleted

## 2013-03-20 NOTE — Telephone Encounter (Signed)
I called [name] about her lab results.  We discussed that her kidney function is stable but we will need to be cognizant in the future about mediations that could possibly harm her kidneys including NSAIDs.  She has stopped her Piroxicam at this time.  She states that she is doing well and is thankful for the call.  Leodis Sias, MD Internal Medicine Resident, PGY III Co-Chief Resident, Internal Medicine Pager: [phone] 03/21/2013 8:19 AM

## 2013-03-20 NOTE — Telephone Encounter (Signed)
Pt called asking for lab results from last visit Pt # [phone]

## 2013-03-25 ENCOUNTER — Other Ambulatory Visit: Payer: Self-pay | Admitting: *Deleted

## 2013-03-25 DIAGNOSIS — N952 Postmenopausal atrophic vaginitis: Secondary | ICD-10-CM

## 2013-03-25 NOTE — Telephone Encounter (Signed)
Pt calls and states she needs another 1/2 tube every month according to the directions and when she runs out she has gotten up and run outside at night and was picked up by the police. She wants to stop running out of cream and outside.

## 2013-03-26 MED ORDER — ESTROGENS, CONJUGATED 0.625 MG/GM VA CREA: TOPICAL_CREAM | Freq: Every day | VAGINAL | Status: DC

## 2013-03-26 NOTE — Telephone Encounter (Signed)
She is already getting two tubes per month which is a high dose.  She is likely using too much as it is.  If she is running around outside and getting picked up by police she needs to get back to see [name] again.

## 2013-03-26 NOTE — Progress Notes (Signed)
Subjective:   [name]   DOB: 1941/01/14 72 y.o.   MRN: [id number]  HPI: [name] is a 72 y.o. woman who presents to clinic today for follow up on her chronic medical problems including hypertension, anemia, hip pain, CKD stage III, and Allergic Rhinitis. See Problem focused Assessment and Plan for full details of her chronic medical conditions.   Past Medical History  Diagnosis Date  . History of cervical cancer 1972  . Ovarian cancer 1972    S/P oophorectomy  . GERD (gastroesophageal reflux disease)   . Gout   . Hypertension   . DJD (degenerative joint disease), multiple sites     Low back pain worst  . DDD (degenerative disc disease), lumbosacral   . Osteoarthritis of hip     bilateral hips  . Hx-TIA (transient ischemic attack) 05/13/2001    Right facial numbness  . Sensorineural hearing loss of both ears   . Mitral valve prolapse 10/27/1999    Subsequent 2D echo show normal mitral valve  . Chronic venous insufficiency   . Colon, diverticulosis Sept. 2011    outpatient colonoscopy by Dr. [name].  Need record  . Hyperlipidemia   . Recurrent boils     History of MRSA skin infections with abscess  . Gout     Uric Acid level 4.2 on 300 allopurinol  . Depression    Current Outpatient Prescriptions  Medication Sig Dispense Refill  . allopurinol (ZYLOPRIM) 300 MG tablet Take 1 tablet (300 mg total) by mouth daily.  30 tablet  6  . amLODipine (NORVASC) 5 MG tablet Take 1 tablet (5 mg total) by mouth daily.  90 tablet  3  . Apple Cider Vinegar 300 MG TABS Take 1 tablet by mouth daily.        . Ascorbic Acid (VITAMIN C) 500 MG tablet Take 500 mg by mouth daily.        [name] atorvastatin (LIPITOR) 80 MG tablet Take 1 tablet (80 mg total) by mouth daily.  30 tablet  6  . benzocaine-Menthol (DERMOPLAST) 20-0.5 % AERO Apply 1 application topically 3 (three) times daily as needed. Apply to back as needed for itching and skin irritation       .  benzonatate (TESSALON PERLES) 100 MG capsule Take 1 capsule (100 mg total) by mouth 3 (three) times daily as needed for cough.  30 capsule  1  . benztropine (COGENTIN) 0.5 MG tablet Take 1 tablet (0.5 mg total) by mouth daily.  90 tablet  0  . Biotin (BIOTIN 5000) 5 MG CAPS Take 1 capsule by mouth daily.        [name] buPROPion (WELLBUTRIN SR) 150 MG 12 hr tablet Take 1 tablet daily for 3 days then 1 tablet twice daily until your follow up  60 tablet  3  . Calcium Carbonate-Vitamin D (CALCIUM-VITAMIN D) 500-200 MG-UNIT per tablet Take 1 tablet by mouth daily.        . cetirizine (ZYRTEC) 10 MG tablet Take 1 tablet (10 mg total) by mouth daily.  30 tablet  6  . clopidogrel (PLAVIX) 75 MG tablet Take 1 tablet (75 mg total) by mouth daily.  90 tablet  3  . clotrimazole (GYNE-LOTRIMIN) 1 % vaginal cream Place 1 Applicatorful vaginally 2 (two) times daily.  45 g  2  . conjugated estrogens (PREMARIN) vaginal cream Place vaginally at bedtime.  60 g  12  . diphenoxylate-atropine (LOMOTIL) 2.5-0.025 MG per tablet Take 1  tablet by mouth 4 (four) times daily as needed for diarrhea or loose stools.  30 tablet  1  . Elastic Bandages & Supports (T.E.D. KNEE LENGTH/L-REGULAR) MISC 1 Units by Does not apply route daily.        . fluocinonide cream (LIDEX) 0.05 % Apply 1 application topically 2 (two) times daily.  60 g  1  . fluticasone (FLONASE) 50 MCG/ACT nasal spray Place 2 sprays into the nose daily.  16 g  6  . furosemide (LASIX) 20 MG tablet Take 1 tablet (20 mg total) by mouth daily. Okay to use one more tablet daily for increased swelling as needed.  45 tablet  3  . loratadine (CLARITIN) 10 MG tablet Take 1 tablet (10 mg total) by mouth daily.  30 tablet  1  . losartan (COZAAR) 100 MG tablet Take 1 tablet (100 mg total) by mouth daily.  30 tablet  6  . nystatin (MYCOSTATIN/NYSTOP) 100000 UNIT/GM POWD Apply 1 g (100,000 Units total) topically 2 (two) times daily. Apply to affected area twice daily  60 g  6  .  oxybutynin (DITROPAN-XL) 5 MG 24 hr tablet Take 1 tablet (5 mg total) by mouth at bedtime.  30 tablet  3  . pantoprazole (PROTONIX) 40 MG tablet Take 1 tablet (40 mg total) by mouth daily.  30 tablet  6  . piroxicam (FELDENE) 20 MG capsule Take 1 capsule (20 mg total) by mouth daily.  90 capsule  1  . polyethylene glycol (MIRALAX / GLYCOLAX) packet Take 17 g by mouth daily as needed.  30 each  6  . potassium chloride (KLOR-CON 10) 10 MEQ tablet Take 1 tablet (10 mEq total) by mouth daily.  30 tablet  6  . traMADol (ULTRAM) 50 MG tablet Take 1 tablet (50 mg total) by mouth 2 (two) times daily.  30 tablet  3  . [DISCONTINUED] oxybutynin (DITROPAN-XL) 5 MG 24 hr tablet Take 1 tablet (5 mg total) by mouth at bedtime.  30 tablet  6   No current facility-administered medications for this visit.   Family History  Problem Relation Age of Onset  . Diabetes Mother   . Heart disease Mother   . Hearing loss Mother   . Stroke Mother   . Schizophrenia Maternal Grandmother    History   Social History  . Marital Status: Widowed    Spouse Name: N/A    Number of Children: N/A  . Years of Education: N/A   Social History Main Topics  . Smoking status: Current Every Day Smoker -- 0.10 packs/day    Types: Cigarettes    Last Attempt to Quit: 10/30/1997  . Smokeless tobacco: Never Used     Comment: pt has started back smoking.  Does 2-4 cigs per day  . Alcohol Use: No  . Drug Use: No  . Sexually Active: No   Other Topics Concern  . None   Social History Narrative  . None   Review of Systems: A full 12 system ROS is negative except as noted in the HPI and A&P.   Objective:  Physical Exam: Filed Vitals:   03/14/13 1615  BP: 130/89  Pulse: 67  Temp: 97.2 F (36.2 C)  TempSrc: Oral  Height: 5' 3.5" (1.613 m)  Weight: 203 lb 9.6 oz (92.352 kg)  SpO2: 100%   Constitutional: Vital signs reviewed.  Patient is a well-developed and well-nourished elderly woman in no acute distress and  cooperative with exam. Alert and oriented x3.  Head: Normocephalic and atraumatic Ear: TM normal bilaterally Mouth: no erythema or exudates, MMM Eyes: PERRL, EOMI, conjunctivae normal, No scleral icterus.  Neck: Supple, Trachea midline normal ROM, No JVD, mass, thyromegaly, or carotid bruit present.  Cardiovascular: RRR, S1 normal, S2 normal, no MRG, pulses symmetric and intact bilaterally Pulmonary/Chest: CTAB, no wheezes, rales, or rhonchi Abdominal: Soft. Non-tender, non-distended, bowel sounds are normal, no masses, organomegaly, or guarding present.  GU: no CVA tenderness Musculoskeletal: No joint deformities, erythema, or stiffness, Bilateral hip pain to palpation.  Gait mildly wide based and favors the left side.   Hematology: no cervical, inginal, or axillary adenopathy.  Neurological: A&O x3, Strength is normal and symmetric bilaterally, cranial nerve II-XII are grossly intact, no focal motor deficit, sensory intact to light touch bilaterally.  Skin: Warm, dry and intact. No rash, cyanosis, or clubbing.  Psychiatric: Normal mood and affect. speech is mildly pressured and tangential.  Behavior is normal. Judgment and insight are intact.  Thought content appears normal. Cognition and concentration are mildly impaired.  Memory are normal.   Assessment & Plan:

## 2013-03-27 NOTE — Assessment & Plan Note (Signed)
BP Readings from Last 3 Encounters:  03/14/13 130/89  01/27/13 131/78  01/01/13 128/75    Lab Results  Component Value Date   NA 140 03/14/2013   K 3.9 03/14/2013   CREATININE 1.54* 03/14/2013    Assessment: Blood pressure control: controlled Progress toward BP goal:  at goal Comments: Blood pressure today is well controlled on her current medications including amlodipine 5 mg daily, Lasix 20 mg daily, and Losartan 100 mg daily.  She denies headaches, changes in her vision, or chest pain.   Plan: Medications:  continue current medications Educational resources provided: brochure;handout;video Self management tools provided:   Other plans:

## 2013-03-27 NOTE — Assessment & Plan Note (Signed)
Creat (mg/dL)  Date Value  1/61/0960 1.54*  10/04/2012 1.75*  05/26/2011 1.27*  01/27/2011 1.15      Creatinine, Ser (mg/dL)  Date Value  02/01/4097 1.47*  06/29/2010 1.33*  05/26/2010 1.46*  03/21/2010 1.16   05/14/2009 1.24*  02/23/2009 1.05    Her creatinine has been ranging around 1.3-1.4 going back to 2011 and recently took a mild bump up to 1.75 but has come down today.  Her calculated GFR is 55 which puts her in a stage III disease.  We will work to protect her kidney function and stop her Piroxicam that she has been taking daily.  We will have to recheck her creatinine at her next appointment to see if it has changed.  She is hovering close to the edge of where I would consider referral to a nephrologist but I don't think she is there yet.

## 2013-03-27 NOTE — Assessment & Plan Note (Signed)
She continues with bilateral hip pain.  She has bilateral hip replacements and has been taking Piroxicam from her orthopedic doctor.  Given her CKD we will stop that medication today and use tramadol to help her rest or when she is most active.  She was encouraged to continue to stay active and moving as much as possible.

## 2013-03-27 NOTE — Assessment & Plan Note (Signed)
CBC:    Component Value Date/Time   WBC 9.2 03/14/2013 1658   HGB 12.0 03/14/2013 1658   HCT 35.7* 03/14/2013 1658   PLT 331 03/14/2013 1658   MCV 91.5 03/14/2013 1658   NEUTROABS 4.9 10/04/2012 1425   LYMPHSABS 2.4 10/04/2012 1425   MONOABS 0.4 10/04/2012 1425   EOSABS 0.6 10/04/2012 1425   BASOSABS 0.0 10/04/2012 1425   Lab Results  Component Value Date   IRON 14* 06/04/2012   TIBC 220* 06/04/2012   FERRITIN 94 06/04/2012   Her hemoglobin has come up nicely since it was last checked, from 10.6 to 12.0.  Her iron studies done in August of 2013 show a pattern consistent with anemia of chronic inflammation vs chronic renal disease.  We will continue to monitor but at this time she doesn't need iron supplementation.

## 2013-03-28 NOTE — Progress Notes (Signed)
Case discussed with Dr. Kathrin Greathouse soon after the resident saw the patient.  We reviewed the resident's history and exam and pertinent patient test results.  I agree with the assessment, diagnosis and plan of care documented in the resident's note.

## 2013-03-31 ENCOUNTER — Other Ambulatory Visit: Payer: Self-pay | Admitting: *Deleted

## 2013-03-31 DIAGNOSIS — J309 Allergic rhinitis, unspecified: Secondary | ICD-10-CM

## 2013-03-31 MED ORDER — BUPROPION HCL ER (SR) 150 MG PO TB12: ORAL_TABLET | ORAL | Status: DC

## 2013-03-31 MED ORDER — FLUTICASONE PROPIONATE 50 MCG/ACT NA SUSP: 2.0000 | Freq: Every day | NASAL | Status: DC

## 2013-04-10 ENCOUNTER — Telehealth: Payer: Self-pay | Admitting: *Deleted

## 2013-04-10 NOTE — Telephone Encounter (Signed)
Pt calls and states she has a cold and needs something for it, she states she cannot come in because her son Thayer Ohm has died and she is planning his funeral. Please advise or you may call her

## 2013-04-11 NOTE — Telephone Encounter (Signed)
I would suggest Coricidin HBP for her could and she can also take Mucinex if she is overly congested.  Extend my sympathies as well.

## 2013-04-21 ENCOUNTER — Other Ambulatory Visit: Payer: Self-pay

## 2013-04-21 DIAGNOSIS — Z1231 Encounter for screening mammogram for malignant neoplasm of breast: Secondary | ICD-10-CM

## 2013-04-25 NOTE — Assessment & Plan Note (Signed)
Cough is no better but BP is well controlled.  We will continue her medication for now since she has only started the medication 3 days prior.   BP Readings from Last 6 Encounters:  01/27/13 131/78  01/01/13 128/75  12/19/12 123/77

## 2013-04-25 NOTE — Assessment & Plan Note (Signed)
She states that she has been having night sweats but when I ask deeper she is having hot flashes throughout the day as well.  We will work to get her over to OB/GYn to see if they can help [place] with uncovering the cause of the night sweats.

## 2013-04-25 NOTE — Assessment & Plan Note (Signed)
She is due for a CBC to follow up on her hgb.  She left clinic today before she could get her blood drawn.

## 2013-04-25 NOTE — Assessment & Plan Note (Signed)
She states that she has been taking the wellbutrin 150 mg BID for now and feels like her mood is better.  She continues to be very tangential, delusional, and hyper religious. She has been sleeping better but stays up late to watch her TV masses and then will not wake up until 11 am.   WE will continue her medication for now and I encouraged her to follow up with [name].

## 2013-04-25 NOTE — Assessment & Plan Note (Signed)
We will need to repeat her Bmet in 2 weeks when she presents for the recheck following the switch in her blood pressure medications.

## 2013-04-25 NOTE — Assessment & Plan Note (Signed)
She has been taking her medications as prescribed and denies chest pain, headaches, or changes in her vision.   BP Readings from Last 3 Encounters:  10/04/12 140/85  06/19/12 111/68  06/04/12 113/70   Blood pressure today is below her goal of <140/80.  WE will continue her medications for now.

## 2013-04-25 NOTE — Assessment & Plan Note (Signed)
Prescription given and sh was instructed to [facility] or [facility] to receive the shot.

## 2013-04-25 NOTE — Assessment & Plan Note (Signed)
Her cough is no better but she only started the medication 3 days ago.  We discussed that if this is an ACEI induced coughing it could take a few weeks to clear up completely.

## 2013-04-25 NOTE — Assessment & Plan Note (Signed)
She has a lingering cough since her URI she had in the middle of February.  When I asked further she states that the cough has been there for "months."  She has been taking her blood pressure medication that includes lisinopril as directed.  BP Readings from Last 3 Encounters:  01/01/13 128/75  12/19/12 123/77  11/11/12 125/77   Blood pressure today is well controlled but with te lingering non-productive cough we will stop lisinopril and start Losartan.  She will come back in 2 weeks to recheck her blood pressure.

## 2013-04-25 NOTE — Assessment & Plan Note (Signed)
She states that se has been having a non-productive cough for "months."  She denies SOB, fever, chills, post nasal drip, or GERD.  WE will switch her Lisinopril to Losartan and see if that helps her cough.

## 2013-04-25 NOTE — Assessment & Plan Note (Signed)
I encouraged her to restart her haldol and to make her follow up appointment with [name].  I assured her that, like me, [name] had her best interest at heart and that he would not harm her. She states that she will think about it.  She is not a danger to herself or others by my assessment today so there is no indication for acute hospitalization.

## 2013-04-25 NOTE — Assessment & Plan Note (Signed)
Creatinine today is 1.75 which is mildly above her baseline.  I encouraged her to increase her fluid intake and we will recheck at her next appointment.

## 2013-04-25 NOTE — Assessment & Plan Note (Signed)
She states that she has significant problems with mobility and would like a lift chair and some additional help around the house. We will send her over to social work and see if we can get home health PT to assess her house and her to see what they can do to help.

## 2013-04-29 ENCOUNTER — Other Ambulatory Visit: Payer: Self-pay | Admitting: Internal Medicine

## 2013-05-01 ENCOUNTER — Ambulatory Visit
Admission: RE | Admit: 2013-05-01 | Discharge: 2013-05-01 | Disposition: A | Discharge status: Home / Self Care | Payer: Medicare Other | Source: Ambulatory Visit

## 2013-05-01 DIAGNOSIS — Z1231 Encounter for screening mammogram for malignant neoplasm of breast: Secondary | ICD-10-CM

## 2013-05-29 ENCOUNTER — Other Ambulatory Visit: Payer: Self-pay | Admitting: Internal Medicine

## 2013-05-29 NOTE — Telephone Encounter (Signed)
Rx called in 

## 2013-05-29 NOTE — Telephone Encounter (Signed)
please call in

## 2013-06-19 ENCOUNTER — Encounter: Payer: Self-pay | Admitting: Internal Medicine

## 2013-06-19 ENCOUNTER — Ambulatory Visit (INDEPENDENT_AMBULATORY_CARE_PROVIDER_SITE_OTHER): Payer: Medicare Other | Admitting: Internal Medicine

## 2013-06-19 ENCOUNTER — Ambulatory Visit (HOSPITAL_COMMUNITY)
Admission: RE | Admit: 2013-06-19 | Discharge: 2013-06-19 | Disposition: A | Discharge status: Home / Self Care | Payer: Medicare Other | Source: Ambulatory Visit | Attending: Internal Medicine | Admitting: Internal Medicine

## 2013-06-19 VITALS — BP 149/86 | HR 78 | Temp 97.5°F | Ht 63.5 in | Wt 196.6 lb

## 2013-06-19 DIAGNOSIS — R05 Cough: Secondary | ICD-10-CM

## 2013-06-19 DIAGNOSIS — I10 Essential (primary) hypertension: Secondary | ICD-10-CM | POA: Insufficient documentation

## 2013-06-19 DIAGNOSIS — R059 Cough, unspecified: Secondary | ICD-10-CM

## 2013-06-19 DIAGNOSIS — K219 Gastro-esophageal reflux disease without esophagitis: Secondary | ICD-10-CM

## 2013-06-19 DIAGNOSIS — R079 Chest pain, unspecified: Secondary | ICD-10-CM | POA: Insufficient documentation

## 2013-06-19 MED ORDER — PANTOPRAZOLE SODIUM 40 MG PO TBEC: 40.0000 mg | DELAYED_RELEASE_TABLET | Freq: Every day | ORAL | Status: DC

## 2013-06-19 MED ORDER — BENZONATATE 100 MG PO CAPS: 100.0000 mg | ORAL_CAPSULE | Freq: Two times a day (BID) | ORAL | Status: DC | PRN

## 2013-06-19 NOTE — Progress Notes (Signed)
I saw and evaluated the patient.  I personally confirmed the key portions of the history and exam documented by Dr. Gill and I reviewed pertinent patient test results.  The assessment, diagnosis, and plan were formulated together and I agree with the documentation in the resident's note. 

## 2013-06-19 NOTE — Assessment & Plan Note (Signed)
Pt states cough began in June and worse at night and feels like a rattling in her chest.  She wakes up to go to the bathroom and begins having SOB, cough, walks

## 2013-06-19 NOTE — Assessment & Plan Note (Signed)
-  continue protonix 40mg daily  

## 2013-06-19 NOTE — Patient Instructions (Addendum)
Please follow up with Dr. Darci Needle in 3 months or sooner if your cough does not improve  1. We have ordered a chest Xray for you today to evaluate your cough further 2. I have sent a prescription for protonix to your pharmacy; please take 1 40mg  tablet daily

## 2013-06-19 NOTE — Assessment & Plan Note (Signed)
Pt has h/o a chronic cough from time to time but has been getting worse since 05/02/23 when her son died.  It is worse in the evening when she lies down and it wakes her up at times.  She also c/o SOB, pleuritic CP, and orthopnea.  She denies any fever/chills, N/V, or abdominal pain.  She has a h/o chronic diastolic heart failure, GERD, allergic rhinitis, bronchitis.  She takes benadryl, claritin, and flonase for allergies.  She reports taking protonix for GERD.  Pt reports tessalon pearls were prescribed in the past and greatly improved her symptoms.  On physical exam she has diminished breath sounds bilaterally but no adventitious sounds; no other pertinent findings noted.  Possibilities for cough include: post-nasal drip, allergic bronchitis, GERD, CHF exacerbation, malignancy, asthma/COPD.  It is unlikely CHF exacerbation as she does not appear to be volume overloaded.  GERD is possible but unlikely because she reports taking protonix for GERD.  Asthma is possible and it appears pt has no h/o PFTs.  -Chest XR -protonix 40mg  daily -tessalon perles 100mg  bid for cough -PFTs if cough does not improve

## 2013-06-19 NOTE — Progress Notes (Signed)
Patient ID: [name], female   DOB: 09/23/1941, 72 y.o.   MRN: [id number]     Subjective:   [name]   DOB: 1941/04/27 72 y.o.   MRN: [id number]  HPI: [name] is a 72 y.o. here for an acute visit.      Past Medical History  Diagnosis Date  . History of cervical cancer 1972  . Ovarian cancer 1972    S/P oophorectomy  . GERD (gastroesophageal reflux disease)   . Gout   . Hypertension   . DJD (degenerative joint disease), multiple sites     Low back pain worst  . DDD (degenerative disc disease), lumbosacral   . Osteoarthritis of hip     bilateral hips  . Hx-TIA (transient ischemic attack) 05/13/2001    Right facial numbness  . Sensorineural hearing loss of both ears   . Mitral valve prolapse 10/27/1999    Subsequent 2D echo show normal mitral valve  . Chronic venous insufficiency   . Colon, diverticulosis Sept. 2011    outpatient colonoscopy by Dr. [name].  Need record  . Hyperlipidemia   . Recurrent boils     History of MRSA skin infections with abscess  . Gout     Uric Acid level 4.2 on 300 allopurinol  . Depression    Current Outpatient Prescriptions  Medication Sig Dispense Refill  . allopurinol (ZYLOPRIM) 300 MG tablet Take 1 tablet (300 mg total) by mouth daily.  30 tablet  6  . amLODipine (NORVASC) 5 MG tablet TAKE 1 TABLET BY MOUTH EVERY DAY  90 tablet  3  . atorvastatin (LIPITOR) 80 MG tablet Take 1 tablet (80 mg total) by mouth daily.  30 tablet  6  . buPROPion (WELLBUTRIN SR) 150 MG 12 hr tablet Take 1 tablet daily for 3 days then 1 tablet twice daily until your follow up  60 tablet  3  . Calcium Carbonate-Vitamin D (CALCIUM-VITAMIN D) 500-200 MG-UNIT per tablet Take 1 tablet by mouth daily.        . clopidogrel (PLAVIX) 75 MG tablet Take 1 tablet (75 mg total) by mouth daily.  90 tablet  3  . conjugated estrogens (PREMARIN) vaginal cream Place vaginally at bedtime.  90 g  12  . diphenhydrAMINE (BENADRYL) 25 mg  capsule Take 25 mg by mouth 1 day or 1 dose.      . fluocinonide cream (LIDEX) 0.05 % Apply 1 application topically 2 (two) times daily.  60 g  1  . fluticasone (FLONASE) 50 MCG/ACT nasal spray Place 2 sprays into the nose daily.  16 g  6  . furosemide (LASIX) 20 MG tablet Take 1 tablet (20 mg total) by mouth daily. Okay to use one more tablet daily for increased swelling as needed.  45 tablet  3  . losartan (COZAAR) 100 MG tablet Take 1 tablet (100 mg total) by mouth daily.  30 tablet  6  . nystatin (MYCOSTATIN) powder APPLY TO AFFECTED AREA TWICE DAILY  60 g  0  . oxybutynin (DITROPAN-XL) 5 MG 24 hr tablet Take 1 tablet (5 mg total) by mouth at bedtime.  30 tablet  3  . pantoprazole (PROTONIX) 40 MG tablet Take 1 tablet (40 mg total) by mouth daily.  30 tablet  6  . polyethylene glycol (MIRALAX / GLYCOLAX) packet Take 17 g by mouth daily as needed.  30 each  6  . potassium chloride (KLOR-CON 10) 10 MEQ tablet Take 1  tablet (10 mEq total) by mouth daily.  30 tablet  6  . traMADol (ULTRAM) 50 MG tablet TAKE 1 TABLET BY MOUTH TWICE DAILY  60 tablet  2  . Apple Cider Vinegar 300 MG TABS Take 1 tablet by mouth daily.        . Ascorbic Acid (VITAMIN C) 500 MG tablet Take 500 mg by mouth daily.        . benzonatate (TESSALON PERLES) 100 MG capsule Take 1 capsule (100 mg total) by mouth 2 (two) times daily as needed for cough.  30 capsule  1  . benztropine (COGENTIN) 0.5 MG tablet Take 1 tablet (0.5 mg total) by mouth daily.  90 tablet  0  . Biotin (BIOTIN 5000) 5 MG CAPS Take 1 capsule by mouth daily.        [name] loratadine (CLARITIN) 10 MG tablet Take 1 tablet (10 mg total) by mouth daily.  30 tablet  1  . [DISCONTINUED] oxybutynin (DITROPAN-XL) 5 MG 24 hr tablet Take 1 tablet (5 mg total) by mouth at bedtime.  30 tablet  6   No current facility-administered medications for this visit.   Family History  Problem Relation Age of Onset  . Diabetes Mother   . Heart disease Mother   . Hearing loss  Mother   . Stroke Mother   . Schizophrenia Maternal Grandmother    History   Social History  . Marital Status: Widowed    Spouse Name: N/A    Number of Children: N/A  . Years of Education: N/A   Social History Main Topics  . Smoking status: Current Every Day Smoker -- 0.10 packs/day    Types: Cigarettes    Last Attempt to Quit: 10/30/1997  . Smokeless tobacco: Never Used     Comment: pt has started back smoking.  Does 2-4 cigs per day  . Alcohol Use: No  . Drug Use: No  . Sexual Activity: No   Other Topics Concern  . None   Social History Narrative  . None   Review of Systems: Review of Systems  Constitutional: Positive for weight loss and malaise/fatigue. Negative for fever and chills.  HENT: Positive for hearing loss, ear pain and congestion. Negative for sore throat.   Eyes: Negative for discharge.  Respiratory: Positive for cough.   Cardiovascular: Positive for chest pain. Negative for palpitations.  Gastrointestinal: Positive for constipation. Negative for nausea, vomiting, abdominal pain and diarrhea.  Genitourinary: Negative for dysuria.  Musculoskeletal: Positive for falls.  Skin: Negative for rash.  Neurological: Negative for dizziness, weakness and headaches.  Psychiatric/Behavioral: Positive for depression and memory loss.    Objective:  Physical Exam: Filed Vitals:   06/19/13 1332  BP: 149/86  Pulse: 78  Temp: 97.5 F (36.4 C)  TempSrc: Oral  Height: 5' 3.5" (1.613 m)  Weight: 196 lb 9.6 oz (89.177 kg)  SpO2: 100%   Physical Exam  Constitutional: She is oriented to person, place, and time and well-developed, well-nourished, and in no distress.  HENT:  Head: Normocephalic and atraumatic.  Mouth/Throat: Uvula is midline and oropharynx is clear and moist.  Eyes: Conjunctivae and EOM are normal. Pupils are equal, round, and reactive to light.  Neck: Neck supple.  Cardiovascular: Normal rate, regular rhythm, normal heart sounds and intact distal  pulses.   Pulmonary/Chest: Effort normal. No accessory muscle usage. No respiratory distress. She has decreased breath sounds.  Abdominal: Soft. Bowel sounds are normal. There is no tenderness.  Neurological: She is alert  and oriented to person, place, and time. No cranial nerve deficit.  Skin: Skin is warm and dry.    Assessment & Plan:

## 2013-06-27 ENCOUNTER — Other Ambulatory Visit: Payer: Self-pay | Admitting: *Deleted

## 2013-06-30 MED ORDER — OXYBUTYNIN CHLORIDE ER 5 MG PO TB24: 5.0000 mg | ORAL_TABLET | Freq: Every day | ORAL | Status: DC

## 2013-06-30 MED ORDER — POTASSIUM CHLORIDE ER 10 MEQ PO TBCR: 10.0000 meq | EXTENDED_RELEASE_TABLET | Freq: Every day | ORAL | Status: DC

## 2013-07-01 ENCOUNTER — Other Ambulatory Visit: Payer: Self-pay | Admitting: Internal Medicine

## 2013-07-16 ENCOUNTER — Telehealth: Payer: Self-pay | Admitting: Licensed Clinical Social Worker

## 2013-07-16 NOTE — Telephone Encounter (Signed)
Ms. Cortopassi placed call to CSW concerned regarding the need to recertify insurance and did not understand why she needed to recertify, pt states she mailed everything to [facility].  CSW returned call to Ms. Rote and informed pt, CSW did not place call to her regarding recertification, however, if pt has medicaid may need to call her caseworker.  On 07/16/13, CSW received 3 voicemails pt had left one more irate than the other.  Pt states she wants her 100% coverage back and further denies any wrong doing/fraud from several years ago.  CSW discussed case with [facility] financial counselor.  [facility] no longer offers yellow card or write off's for those that are insured.  There is no charity care available, pt will need to pay copayments per insurance coverage.  CSW attempted to call Ms. Runions back two times between 11:00am and 11:30am, no answer, no voicemail.

## 2013-07-17 ENCOUNTER — Ambulatory Visit (INDEPENDENT_AMBULATORY_CARE_PROVIDER_SITE_OTHER): Payer: Medicare Other | Admitting: Internal Medicine

## 2013-07-17 ENCOUNTER — Encounter: Payer: Self-pay | Admitting: Internal Medicine

## 2013-07-17 ENCOUNTER — Ambulatory Visit: Payer: Self-pay | Admitting: Internal Medicine

## 2013-07-17 VITALS — BP 148/85 | HR 67 | Temp 97.8°F | Ht 63.0 in | Wt 195.6 lb

## 2013-07-17 DIAGNOSIS — R05 Cough: Secondary | ICD-10-CM

## 2013-07-17 DIAGNOSIS — J449 Chronic obstructive pulmonary disease, unspecified: Secondary | ICD-10-CM

## 2013-07-17 DIAGNOSIS — Z23 Encounter for immunization: Secondary | ICD-10-CM

## 2013-07-17 DIAGNOSIS — R059 Cough, unspecified: Secondary | ICD-10-CM

## 2013-07-17 DIAGNOSIS — I5032 Chronic diastolic (congestive) heart failure: Secondary | ICD-10-CM

## 2013-07-17 DIAGNOSIS — Z0000 Encounter for general adult medical examination without abnormal findings: Secondary | ICD-10-CM | POA: Insufficient documentation

## 2013-07-17 DIAGNOSIS — R222 Localized swelling, mass and lump, trunk: Secondary | ICD-10-CM

## 2013-07-17 MED ORDER — ALBUTEROL SULFATE HFA 108 (90 BASE) MCG/ACT IN AERS: 2.0000 | INHALATION_SPRAY | Freq: Four times a day (QID) | RESPIRATORY_TRACT | Status: DC | PRN

## 2013-07-17 MED ORDER — FLUTICASONE-SALMETEROL 250-50 MCG/DOSE IN AEPB: 1.0000 | INHALATION_SPRAY | Freq: Two times a day (BID) | RESPIRATORY_TRACT | Status: DC

## 2013-07-17 NOTE — Patient Instructions (Signed)
1. Please start using albuterol and Advair inhalers.You will need a pulmonary function test.  2. We will give you a referral to your previous dermatologist. 3. If you have worsening of your symptoms or new symptoms arise, please call the clinic ([phone]), or go to the ER immediately if symptoms are severe.  You have done great job in taking all your medications. I appreciate it very much. Please continue doing that.

## 2013-07-17 NOTE — Progress Notes (Signed)
Patient ID: [name], female   DOB: [birth year], 72 y.o.   MRN: [id number] Subjective:   [name]   DOB: Mar 16, 1941 72 y.o.   MRN: [id number]  Subjective:   [name]   DOB: [birth year] 72 y.o.   MRN: [id number]  CC:   Follow up visit.   [name] is a 72 y.o. lady with past medical history as outlined below, who presents for a followup visit today  1. Cough: patient has been coughing for more than 8 months.  She used to take lisinopril for her blood pressure. Lisinopril was switched to losartan on 01/01/13. But her cough has not improved significantly. She was evaluated in clinic on 06/19/13 because of persistent cough. She got a chest x-ray which showed right basilar atelectasis without any infiltrations. She was also started with Protonix for GERD. Patient is also taking Benadryl, Claritin and Flonase for allergies. Today she reports that her cough has not improved significantly. She still has cough, most of the time she has dry cough, but recently she started coughing up greenish sputum. She has a h/o chronic diastolic heart failure. She has mild shortness of breath, but no chest pain, PND or orthopnea. No fever or chills. Patient reports that she had smoked 1 PAD for more than 35 years. She quit smoking recently. She also reports that her grandfather (mother's side) by [place] lung cancer at age of 35.  2. CHF: Patient 2-D echo on 03/16/10 showed EF 65-70% with grade 2 diastolic dysfunction. She is currently taking low dose of Lasix 20 mg daily and losartan 100 mg daily. She's not on aspirin due to allergy. Her body weight was 208 on 11/11/12--> 195 pounds today. Her bp is 148/85 mmHg.   3. Chest wall nodule: Patient notice a painful nodule under the skin of front chest wall. It has been there for more than 3 weeks. It is tender, but not red or warm. She does not have fever or chills. She reports that several years ago she had I&D  procedure for abscess in that area,  which left a small scar above that nodule.   ROS:  Denies fever, chills, fatigue, headaches, abdominal pain ,diarrhea, constipation, dysuria, urgency, frequency, hematuria.    Past Medical History  Diagnosis Date  . History of cervical cancer 1972  . Ovarian cancer 1972    S/P oophorectomy  . GERD (gastroesophageal reflux disease)   . Gout   . Hypertension   . DJD (degenerative joint disease), multiple sites     Low back pain worst  . DDD (degenerative disc disease), lumbosacral   . Osteoarthritis of hip     bilateral hips  . Hx-TIA (transient ischemic attack) 05/13/2001    Right facial numbness  . Sensorineural hearing loss of both ears   . Mitral valve prolapse 10/27/1999    Subsequent 2D echo show normal mitral valve  . Chronic venous insufficiency   . Colon, diverticulosis Sept. 2011    outpatient colonoscopy by Dr. [name].  Need record  . Hyperlipidemia   . Recurrent boils     History of MRSA skin infections with abscess  . Gout     Uric Acid level 4.2 on 300 allopurinol  . Depression    Current Outpatient Prescriptions  Medication Sig Dispense Refill  . allopurinol (ZYLOPRIM) 300 MG tablet Take 1 tablet (300 mg total) by mouth daily.  30 tablet  6  . amLODipine (NORVASC) 5  MG tablet TAKE 1 TABLET BY MOUTH EVERY DAY  90 tablet  3  . Ascorbic Acid (VITAMIN C) 500 MG tablet Take 500 mg by mouth daily.        [name] atorvastatin (LIPITOR) 80 MG tablet Take 1 tablet (80 mg total) by mouth daily.  30 tablet  6  . benzonatate (TESSALON) 100 MG capsule TAKE 1 CAPSULE BY MOUTH TWICE DAILY AS NEEDED FOR COUGH  30 capsule  2  . buPROPion (WELLBUTRIN SR) 150 MG 12 hr tablet Take 1 tablet daily for 3 days then 1 tablet twice daily until your follow up  60 tablet  3  . Calcium Carbonate-Vitamin D (CALCIUM-VITAMIN D) 500-200 MG-UNIT per tablet Take 1 tablet by mouth daily.        . clopidogrel (PLAVIX) 75 MG tablet Take 1 tablet (75 mg total) by mouth  daily.  90 tablet  3  . conjugated estrogens (PREMARIN) vaginal cream Place vaginally at bedtime.  90 g  12  . diphenhydrAMINE (SOMINEX) 25 MG tablet Take 25 mg by mouth at bedtime as needed for sleep.      . fluocinonide cream (LIDEX) 0.05 % Apply 1 application topically 2 (two) times daily.  60 g  1  . fluticasone (FLONASE) 50 MCG/ACT nasal spray Place 2 sprays into the nose daily.  16 g  6  . furosemide (LASIX) 20 MG tablet Take 1 tablet (20 mg total) by mouth daily. Okay to use one more tablet daily for increased swelling as needed.  45 tablet  3  . losartan (COZAAR) 100 MG tablet Take 1 tablet (100 mg total) by mouth daily.  30 tablet  6  . nystatin (MYCOSTATIN) powder APPLY TO AFFECTED AREA TWICE DAILY  60 g  1  . oxybutynin (DITROPAN-XL) 5 MG 24 hr tablet Take 1 tablet (5 mg total) by mouth at bedtime.  90 tablet  1  . pantoprazole (PROTONIX) 40 MG tablet Take 1 tablet (40 mg total) by mouth daily.  30 tablet  6  . polyethylene glycol (MIRALAX / GLYCOLAX) packet Take 17 g by mouth daily as needed.  30 each  6  . potassium chloride (KLOR-CON 10) 10 MEQ tablet Take 1 tablet (10 mEq total) by mouth daily.  90 tablet  1  . traMADol (ULTRAM) 50 MG tablet TAKE 1 TABLET BY MOUTH TWICE DAILY  60 tablet  2  . albuterol (PROVENTIL HFA;VENTOLIN HFA) 108 (90 BASE) MCG/ACT inhaler Inhale 2 puffs into the lungs every 6 (six) hours as needed for wheezing.  1 Inhaler  2  . Biotin (BIOTIN 5000) 5 MG CAPS Take 1 capsule by mouth daily.        . Fluticasone-Salmeterol (ADVAIR DISKUS) 250-50 MCG/DOSE AEPB Inhale 1 puff into the lungs 2 (two) times daily.  60 each  1  . [DISCONTINUED] oxybutynin (DITROPAN-XL) 5 MG 24 hr tablet Take 1 tablet (5 mg total) by mouth at bedtime.  30 tablet  6   No current facility-administered medications for this visit.   Family History  Problem Relation Age of Onset  . Diabetes Mother   . Heart disease Mother   . Hearing loss Mother   . Stroke Mother   . Schizophrenia  Maternal Grandmother    History   Social History  . Marital Status: Widowed    Spouse Name: N/A    Number of Children: N/A  . Years of Education: N/A   Social History Main Topics  . Smoking status: Former Smoker --  0.10 packs/day    Types: Cigarettes    Quit date: 04/08/2013  . Smokeless tobacco: Never Used     Comment: pt has started back smoking.  Does 2-4 cigs per day  . Alcohol Use: No  . Drug Use: No  . Sexual Activity: No   Other Topics Concern  . Not on file   Social History Narrative  . No narrative on file    Review of Systems: Full 14-point review of systems otherwise negative. See HPI.   Objective:  Physical Exam: Filed Vitals:   07/17/13 1410  BP: 148/85  Pulse: 67  Temp: 97.8 F (36.6 C)  TempSrc: Oral  Height: 5\' 3"  (1.6 m)  Weight: 195 lb 9.6 oz (88.724 kg)  SpO2: 100%   Constitutional: Vital signs reviewed.  Patient is a well-developed and well-nourished, in no acute distress and cooperative with exam.   HEENT:  Head: Normocephalic and atraumatic Mouth: no erythema or exudates, MMM Eyes: PERRL, EOMI, conjunctivae normal, No scleral icterus.  Neck: Supple, Trachea midline normal ROM, No JVD Cardiovascular: RRR, S1 normal, S2 normal, no MRG, pulses symmetric and intact bilaterally Pulmonary: CTAB, occasional rhonchi, but no wheezes or rales. Chest wall: There is a linear scar over her front chest wall in transverse direction. Just below that scar, there is painful nodule under the skin. It is approximately 2x2 cm in size. It is not warm or red. No skin breaks. No discharge. Abdominal: Soft. Non-tender, non-distended, bowel sounds are normal, no masses, organomegaly, or guarding present.  GU: no CVA tenderness Musculoskeletal: No joint deformities, erythema, or stiffness, ROM full and non-tender Extremities: 1+ pitting edema.  Neurological: A&O x3, Strength is normal and symmetric bilaterally, cranial nerve II-XII are grossly intact, no focal motor  deficit, sensory intact to light touch bilaterally. Skin: Warm, dry and intact. No rash, cyanosis, or clubbing.  Psychiatric: Normal mood and affect. No suicidal or homicidal ideation.  Assessment & Plan:

## 2013-07-17 NOTE — Assessment & Plan Note (Signed)
The etiology for her cough is not clear. A potential possibility is undiagnosed COPD. She did not have PFT testing before. Patient has smoked one pack a day for 35 years and just quit smoking recently. Other differential diagnoses include, but less likely CHF exacerbation (though patient has 1+ pitting leg edema, her body weight has been stable, actually decreased a little bit. In addition, her lung auscultation is clear bilaterally, no JVD) and losartan side effects (though possible, but less likely).    -will treat patient empirically for COPD with albuterol and Advair inhalers. -Continue Protonix for GERD. -Will get pulmonary function tests and followup

## 2013-07-17 NOTE — Assessment & Plan Note (Signed)
Patient's 2-D echo on 03/16/10 showed EF 65-70% with grade 2 diastolic dysfunction. Patient is currently taking low dose Lasix 20 mg daily  and losartan 100 mg daily. Her body weight has been stable. He does not have JVD. Lung auscultation is clear bilaterally. It is unlikely that patient has CHF exacerbation.  -will continue current regimen.

## 2013-07-17 NOTE — Assessment & Plan Note (Signed)
Patient was given flu shot today.

## 2013-07-17 NOTE — Assessment & Plan Note (Signed)
Patient has a painful nodule over her chest wall for more than 3 weeks. Etiology is not clear. It is not red or warm, making the abscess unlikely diagnosis. Patient reports that she had I&D procedure due to abscess in the similar area, but it was above that nodule several years ago. She recalls she could be injected with something in that area, which she is not very sure now. She was seen by [facility] dermatology in the past.   -will give he referral to [facility] dermatology for further evaluation and treatment.

## 2013-07-18 NOTE — Progress Notes (Signed)
I personally saw the patient and examined the nodule. I agree with the documented findings. We reviewed the resident's history and exam and pertinent patient test results.  I agree with the assessment, diagnosis, and plan of care documented in the resident's note.

## 2013-07-21 ENCOUNTER — Telehealth: Payer: Self-pay | Admitting: *Deleted

## 2013-07-21 NOTE — Telephone Encounter (Signed)
Talked with pt about getting both inhalers 07/23/13 AM. Talked with pharmacy - talked with pt this AM - pharmacy  was unable to reach pt on 07/18/13. Pt has no transportation - reassured pt. Stanton Kidney Ziyanna Tolin RN 07/21/13 10AM

## 2013-07-22 ENCOUNTER — Telehealth: Payer: Self-pay | Admitting: Licensed Clinical Social Worker

## 2013-07-22 NOTE — Telephone Encounter (Signed)
Thank you for making me aware. Please schedule an appointment for this patient as soon as possible. She will definitely need to be seen to assess her hallucinations.   I copied the front desk on this message.   Thanks!   Fleet Contras

## 2013-07-22 NOTE — Telephone Encounter (Signed)
[name] placed call to CSW this morning.  CSW had lengthy telephone call with [name] lasting over 30 minutes.  Pt did say speaking concerns and current issues was beneficial.  Pt's most prevalent concern is cost of her medical care.  Pt was previously receiving a discount assisting with her Medicare copayment.  CSW informed [name], [name] changed programs earlier in the year and decided not to continue with the program she was on.  CSW informed [name] of the  [employer] and provided pt with the number to Patient Accounts.  Pt appreciative.  [name] also voiced concern of hearing and seeing something under her bed and has been sleeping on the sofa lately.  Pt states she is unsure if something is truly under her bed or not.  CSW encouraged [name] to re-establish service with Dr. Lolly Drake at [facility].  CSW offered to schedule appt for [name] pt states she will consider.  Conversation consisted of multiple issues ranging from family to medical.  CSW offered emotional support and encouragement.

## 2013-07-24 ENCOUNTER — Ambulatory Visit (HOSPITAL_COMMUNITY)
Admission: RE | Admit: 2013-07-24 | Discharge: 2013-07-24 | Disposition: A | Discharge status: Home / Self Care | Payer: Medicare Other | Source: Ambulatory Visit | Attending: Internal Medicine | Admitting: Internal Medicine

## 2013-07-24 DIAGNOSIS — J4489 Other specified chronic obstructive pulmonary disease: Secondary | ICD-10-CM | POA: Insufficient documentation

## 2013-07-24 DIAGNOSIS — J449 Chronic obstructive pulmonary disease, unspecified: Secondary | ICD-10-CM

## 2013-07-24 MED ORDER — ALBUTEROL SULFATE (5 MG/ML) 0.5% IN NEBU
2.5000 mg | INHALATION_SOLUTION | Freq: Once | RESPIRATORY_TRACT | Status: AC
  Administered 2013-07-24: 2.5 mg via RESPIRATORY_TRACT

## 2013-07-25 ENCOUNTER — Telehealth: Payer: Self-pay | Admitting: *Deleted

## 2013-07-25 NOTE — Telephone Encounter (Signed)
Pt called prefers to hold off on referral to dermatology  till pt sees Dr Meredith Pel. Had same problem years ago and Dr Meredith Pel took care of it. Talked with Ellwood Dense and she will call pt when Dr Meredith Pel is attending in clinic. Pt aware - clinic will call her when [facility] gets sch in clinic. Stanton Kidney Emiliya Chretien RN 07/25/13 11:45AM

## 2013-07-28 ENCOUNTER — Encounter: Payer: Self-pay | Admitting: Internal Medicine

## 2013-07-28 ENCOUNTER — Ambulatory Visit (INDEPENDENT_AMBULATORY_CARE_PROVIDER_SITE_OTHER): Payer: Medicare Other | Admitting: Internal Medicine

## 2013-07-28 VITALS — BP 161/81 | HR 74 | Temp 98.3°F | Ht 63.0 in | Wt 184.5 lb

## 2013-07-28 DIAGNOSIS — L723 Sebaceous cyst: Secondary | ICD-10-CM

## 2013-07-28 DIAGNOSIS — L089 Local infection of the skin and subcutaneous tissue, unspecified: Secondary | ICD-10-CM

## 2013-07-28 DIAGNOSIS — N390 Urinary tract infection, site not specified: Secondary | ICD-10-CM

## 2013-07-28 LAB — POCT URINALYSIS DIPSTICK
Bilirubin, UA: NEGATIVE
Blood, UA: NEGATIVE
Glucose, UA: NEGATIVE
Ketones, UA: NEGATIVE
Leukocytes, UA: NEGATIVE
Nitrite, UA: POSITIVE
Protein, UA: NEGATIVE
Spec Grav, UA: 1.025
Urobilinogen, UA: 0.2
pH, UA: 6.5

## 2013-07-28 MED ORDER — DOXYCYCLINE HYCLATE 100 MG PO TABS: 100.0000 mg | ORAL_TABLET | Freq: Two times a day (BID) | ORAL | Status: DC

## 2013-07-28 NOTE — Assessment & Plan Note (Addendum)
Patient reports dysuria, U/A dipstick positive for Nitrites.  Will send for culture.  Patient to be treated with doxycycline 100mg  BID x 10 days for infected cyst, doxycycline will likely cover for any possible UTI.  Addendum: UCx grew Klebsiella Pneumoniae sensitive to Augmentin, called patient and prescribed 10 day course.

## 2013-07-28 NOTE — Progress Notes (Signed)
Patient ID: [name], female   DOB: 05-19-1941, 72 y.o.   MRN: [id number]  I met with the patient with Dr Mikey Bussing. I examined the chest wall swelling. It was a red, tender, about 2.5 cm wide and 1 cm raised circular swelling draining purulent material. The swelling was fluctuant in the middle and indurated circumferentially. We went ahead and took consent for procedure and expressed the remaining purulent material with proper aseptic precautions, and tried to break loculations if any. However, we could not drain it completely. We dressed it back. (For complete procedure notes please see resident note).   The patient has been prescribed Doxycycline 100 mg BID for 10 days and we have sent wound cultures. The patient has been instructed to call back if she develops any fever, chills, worsening swelling, redness or chest pain. She expressed understanding. The patient agrees to call back in 3 days and let us know of the progress.     I saw and evaluated the patient.  I personally confirmed the key portions of the history and exam documented by Dr. Mikey Bussing and I reviewed pertinent patient test results.  The assessment, diagnosis, and plan were formulated together and I agree with the documentation in the resident's note.

## 2013-07-28 NOTE — Assessment & Plan Note (Addendum)
Epidermoid cyst with purulent drainage. Consent was obtained for I&D.  Area was ale to be expressed without incision made. Areas of loculation were partially broken up however patient complained of pain and procedure was aborted.  Wound cultures were obtained, and dressing applied.  No packing was inserted.  Patient was prescribed a 10 day course of antibiotics to cover for MRSA.  Instructed to go to ED if area worsened.  Patient instructed to call clinic and give update of how she is doing in 3 days and return in 1 week for reevaluation.  She may likely need an additional I&D. As well as a cyst removal in 4-6 weeks after resolution. Addendum: Cx grew Proteus Mirabilis sensitive to Augmentin, called patient and prescribed 10 day course.  D/C Doxycycline,  Called pharmacy and will bring Abx to patient tomorrow morning.

## 2013-07-28 NOTE — Progress Notes (Signed)
Patient ID: ARAYAH [name], female   DOB: 1941/08/31, 72 y.o.   MRN: [id number]   Subjective:   Patient ID: KYRIAKI MODER female   DOB: 01/06/1941 72 y.o.   MRN: [id number]  HPI: Ms.Elsbeth P Coen is a 72 y.o.  female who presents today for ruptured cyst on her chest wall.  She was seen in clinic last week by Dr. Clyde Lundborg who referred her to dermatology for the nodule that was developing on her chest.  She decided not to keep that appointment and wanted to wait to see Dr Meredith Pel.  However since that time she reports the area has continued to grow and become more painful.  Around midnight the reports that the nodule burst and white/grey material with a small amount of blood came out. She reports it was very foul smelling.  She has kept it covered with bandages since that time. In addition she reports that since her last visit she has developed some dysuria. She denies any changes in urine frequency.  She denies any fever or chills.     Past Medical History  Diagnosis Date  . History of cervical cancer 1972  . Ovarian cancer 1972    S/P oophorectomy  . GERD (gastroesophageal reflux disease)   . Gout   . Hypertension   . DJD (degenerative joint disease), multiple sites     Low back pain worst  . DDD (degenerative disc disease), lumbosacral   . Osteoarthritis of hip     bilateral hips  . Hx-TIA (transient ischemic attack) 05/13/2001    Right facial numbness  . Sensorineural hearing loss of both ears   . Mitral valve prolapse 10/27/1999    Subsequent 2D echo show normal mitral valve  . Chronic venous insufficiency   . Colon, diverticulosis Sept. 2011    outpatient colonoscopy by Dr. [name].  Need record  . Hyperlipidemia   . Recurrent boils     History of MRSA skin infections with abscess  . Gout     Uric Acid level 4.2 on 300 allopurinol  . Depression    Current Outpatient Prescriptions  Medication Sig Dispense Refill  . albuterol (PROVENTIL HFA;VENTOLIN HFA) 108 (90 BASE)  MCG/ACT inhaler Inhale 2 puffs into the lungs every 6 (six) hours as needed for wheezing.  1 Inhaler  2  . allopurinol (ZYLOPRIM) 300 MG tablet Take 1 tablet (300 mg total) by mouth daily.  30 tablet  6  . amLODipine (NORVASC) 5 MG tablet TAKE 1 TABLET BY MOUTH EVERY DAY  90 tablet  3  . Ascorbic Acid (VITAMIN C) 500 MG tablet Take 500 mg by mouth daily.        [name] atorvastatin (LIPITOR) 80 MG tablet Take 1 tablet (80 mg total) by mouth daily.  30 tablet  6  . benzonatate (TESSALON) 100 MG capsule TAKE 1 CAPSULE BY MOUTH TWICE DAILY AS NEEDED FOR COUGH  30 capsule  2  . Biotin (BIOTIN 5000) 5 MG CAPS Take 1 capsule by mouth daily.        [name] buPROPion (WELLBUTRIN SR) 150 MG 12 hr tablet Take 1 tablet daily for 3 days then 1 tablet twice daily until your follow up  60 tablet  3  . Calcium Carbonate-Vitamin D (CALCIUM-VITAMIN D) 500-200 MG-UNIT per tablet Take 1 tablet by mouth daily.        . clopidogrel (PLAVIX) 75 MG tablet Take 1 tablet (75 mg total) by mouth daily.  90 tablet  3  .  conjugated estrogens (PREMARIN) vaginal cream Place vaginally at bedtime.  90 g  12  . diphenhydrAMINE (SOMINEX) 25 MG tablet Take 25 mg by mouth at bedtime as needed for sleep.      . fluocinonide cream (LIDEX) 0.05 % Apply 1 application topically 2 (two) times daily.  60 g  1  . fluticasone (FLONASE) 50 MCG/ACT nasal spray Place 2 sprays into the nose daily.  16 g  6  . Fluticasone-Salmeterol (ADVAIR DISKUS) 250-50 MCG/DOSE AEPB Inhale 1 puff into the lungs 2 (two) times daily.  60 each  1  . furosemide (LASIX) 20 MG tablet Take 1 tablet (20 mg total) by mouth daily. Okay to use one more tablet daily for increased swelling as needed.  45 tablet  3  . losartan (COZAAR) 100 MG tablet Take 1 tablet (100 mg total) by mouth daily.  30 tablet  6  . nystatin (MYCOSTATIN) powder APPLY TO AFFECTED AREA TWICE DAILY  60 g  1  . oxybutynin (DITROPAN-XL) 5 MG 24 hr tablet Take 1 tablet (5 mg total) by mouth at bedtime.  90 tablet  1   . pantoprazole (PROTONIX) 40 MG tablet Take 1 tablet (40 mg total) by mouth daily.  30 tablet  6  . polyethylene glycol (MIRALAX / GLYCOLAX) packet Take 17 g by mouth daily as needed.  30 each  6  . potassium chloride (KLOR-CON 10) 10 MEQ tablet Take 1 tablet (10 mEq total) by mouth daily.  90 tablet  1  . traMADol (ULTRAM) 50 MG tablet TAKE 1 TABLET BY MOUTH TWICE DAILY  60 tablet  2  . [DISCONTINUED] oxybutynin (DITROPAN-XL) 5 MG 24 hr tablet Take 1 tablet (5 mg total) by mouth at bedtime.  30 tablet  6   No current facility-administered medications for this visit.   Family History  Problem Relation Age of Onset  . Diabetes Mother   . Heart disease Mother   . Hearing loss Mother   . Stroke Mother   . Schizophrenia Maternal Grandmother    History   Social History  . Marital Status: Widowed    Spouse Name: N/A    Number of Children: N/A  . Years of Education: N/A   Social History Main Topics  . Smoking status: Former Smoker -- 0.10 packs/day    Types: Cigarettes    Quit date: 04/08/2013  . Smokeless tobacco: Never Used     Comment: pt has started back smoking.  Does 2-4 cigs per day  . Alcohol Use: No  . Drug Use: No  . Sexual Activity: No   Other Topics Concern  . None   Social History Narrative  . None   Review of Systems: Review of Systems  Constitutional: Negative for fever, chills, weight loss and malaise/fatigue.  HENT: Negative for congestion.   Eyes: Negative for blurred vision.  Respiratory: Negative for cough, sputum production and shortness of breath.   Cardiovascular: Negative for chest pain, palpitations and leg swelling.  Gastrointestinal: Negative for heartburn, nausea, vomiting, abdominal pain, diarrhea and constipation.  Genitourinary: Positive for dysuria. Negative for frequency.  Skin: Negative for rash.  Neurological: Negative for dizziness and headaches.  Psychiatric/Behavioral: Negative for depression.     Objective:  Physical Exam: Filed  Vitals:   07/28/13 1427  BP: 161/81  Pulse: 74  Temp: 98.3 F (36.8 C)  TempSrc: Oral  Height: 5\' 3"  (1.6 m)  Weight: 184 lb 8 oz (83.689 kg)  SpO2: 99%  Physical Exam  Nursing note and vitals reviewed. Constitutional: She is well-developed, well-nourished, and in no distress. No distress.  HENT:  Head: Normocephalic and atraumatic.  Eyes: Conjunctivae are normal.  Cardiovascular: Normal rate, regular rhythm, normal heart sounds and intact distal pulses.   No murmur heard. Pulmonary/Chest: Effort normal and breath sounds normal. No respiratory distress. She has no wheezes. She has no rales.    Abdominal: Soft. Bowel sounds are normal. She exhibits no distension. There is no tenderness.  Skin: Skin is warm and dry. She is not diaphoretic.     Assessment & Plan:   See Problem Based Assessment and Plan Meds ordered this encounter  Medications  . doxycycline (VIBRA-TABS) 100 MG tablet    Sig: Take 1 tablet (100 mg total) by mouth 2 (two) times daily.    Dispense:  20 tablet    Refill:  0     Orders Placed This Encounter  Procedures  . Culture, Wound  . Culture, Urine  . POCT Urinalysis Dipstick     Incision and Drainage Procedure Note  Pre-operative Diagnosis: Infected Epidermoid cyst  Post-operative Diagnosis: same  Indications: Abscess formation  Anesthesia: 1% plain lidocaine  Procedure Details  The procedure, risks and complications have been discussed in detail (including, infection, bleeding) with the patient, and the patient has signed consent to the procedure.  The skin was sterilely prepped over the affected area in the usual fashion. After 2cc local anesthesia,pressure was applied to the area causing purulent and sebaceous drainage.  A hemostat was used to break loculations.  Patient then began to fill pain, additional anesthetic was offered but she preferred to stop the procedure.  Would culture was obtained.  The area was dressed with antibiotic  ointment and covered with gause.  The patient was observed until stable.  EBL: 3 cc's  Drains: none  Condition: Tolerated procedure well   Complications: pain.

## 2013-07-28 NOTE — Patient Instructions (Signed)
1.  If the area starts draining much more than it was previously or if you develop fever or chills please go to the Emergency Department. 2.  Take Doxycycline 100mg  twice a day for 10 days.

## 2013-07-29 ENCOUNTER — Telehealth: Payer: Self-pay | Admitting: *Deleted

## 2013-07-29 NOTE — Telephone Encounter (Signed)
Called and spoke to patient about CKD stage III, explained that it is a diagnosis we apply to watch her kidney function further and to avoid things that may hurt her kidneys like NSAIDs.  I told her we do not need to renally adjust her doxycycline or other medications at this time.  She reported she had no further questions and her social worker is to pick up her doxycycline within the hour.

## 2013-07-29 NOTE — Telephone Encounter (Signed)
Pt has called twice this AM  about Doxycycline. Talked with Venita Sheffield and states pharmacy [facility] will deliver. Pharmacy told Venita Sheffield that pt has transferred Rx to [facility]. Pt has now called social services to pick up Rx for pt. Pt is upset about news on kidneys - wants to talk with Dr Mikey Bussing or Dr Dalphine Handing. Stanton Kidney Emberly Tomasso RN 07/29/13 10AM

## 2013-07-30 ENCOUNTER — Telehealth: Payer: Self-pay | Admitting: *Deleted

## 2013-07-30 NOTE — Telephone Encounter (Signed)
Area sl  better from I&D - - using warm water to area. Suggest to also use peroxide to inc. Using DSD. Will be glad to see pt if any change in looks. Stanton Kidney Zykiria Bruening RN 07/30/13 3:20PM

## 2013-07-31 ENCOUNTER — Other Ambulatory Visit: Payer: Self-pay | Admitting: Internal Medicine

## 2013-07-31 LAB — WOUND CULTURE
Gram Stain: NONE SEEN
Gram Stain: NONE SEEN

## 2013-07-31 LAB — URINE CULTURE: Colony Count: >=100000

## 2013-07-31 MED ORDER — AMOXICILLIN-POT CLAVULANATE 875-125 MG PO TABS: 1.0000 | ORAL_TABLET | Freq: Two times a day (BID) | ORAL | Status: AC

## 2013-07-31 NOTE — Addendum Note (Signed)
Addended by: Gust Rung on: 07/31/2013 03:37 PM   Modules accepted: Orders, Medications

## 2013-08-01 ENCOUNTER — Other Ambulatory Visit: Payer: Self-pay | Admitting: *Deleted

## 2013-08-01 ENCOUNTER — Other Ambulatory Visit: Payer: Self-pay | Admitting: Internal Medicine

## 2013-08-01 DIAGNOSIS — L259 Unspecified contact dermatitis, unspecified cause: Secondary | ICD-10-CM

## 2013-08-05 ENCOUNTER — Other Ambulatory Visit: Payer: Self-pay | Admitting: *Deleted

## 2013-08-05 MED ORDER — FLUOCINONIDE 0.05 % EX CREA: 1.0000 "application " | TOPICAL_CREAM | Freq: Two times a day (BID) | CUTANEOUS | Status: DC

## 2013-08-06 ENCOUNTER — Other Ambulatory Visit: Payer: Self-pay | Admitting: Internal Medicine

## 2013-08-07 ENCOUNTER — Ambulatory Visit (INDEPENDENT_AMBULATORY_CARE_PROVIDER_SITE_OTHER): Payer: Medicare Other | Admitting: Internal Medicine

## 2013-08-07 ENCOUNTER — Encounter (HOSPITAL_COMMUNITY): Payer: Self-pay | Admitting: Emergency Medicine

## 2013-08-07 ENCOUNTER — Encounter: Payer: Self-pay | Admitting: Internal Medicine

## 2013-08-07 ENCOUNTER — Emergency Department (HOSPITAL_COMMUNITY)
Admission: EM | Admit: 2013-08-07 | Discharge: 2013-08-07 | Disposition: A | Discharge status: Home / Self Care | Payer: Medicare Other | Attending: Emergency Medicine | Admitting: Emergency Medicine

## 2013-08-07 ENCOUNTER — Other Ambulatory Visit (HOSPITAL_COMMUNITY)
Admission: RE | Admit: 2013-08-07 | Discharge: 2013-08-07 | Disposition: A | Discharge status: Home / Self Care | Payer: Medicare Other | Source: Ambulatory Visit | Attending: Internal Medicine | Admitting: Internal Medicine

## 2013-08-07 VITALS — BP 157/77 | HR 75 | Temp 97.0°F | Wt 194.8 lb

## 2013-08-07 DIAGNOSIS — L03319 Cellulitis of trunk, unspecified: Secondary | ICD-10-CM | POA: Insufficient documentation

## 2013-08-07 DIAGNOSIS — M109 Gout, unspecified: Secondary | ICD-10-CM | POA: Insufficient documentation

## 2013-08-07 DIAGNOSIS — Z8673 Personal history of transient ischemic attack (TIA), and cerebral infarction without residual deficits: Secondary | ICD-10-CM | POA: Insufficient documentation

## 2013-08-07 DIAGNOSIS — Z8541 Personal history of malignant neoplasm of cervix uteri: Secondary | ICD-10-CM | POA: Insufficient documentation

## 2013-08-07 DIAGNOSIS — L02213 Cutaneous abscess of chest wall: Secondary | ICD-10-CM

## 2013-08-07 DIAGNOSIS — Z79899 Other long term (current) drug therapy: Secondary | ICD-10-CM | POA: Insufficient documentation

## 2013-08-07 DIAGNOSIS — H903 Sensorineural hearing loss, bilateral: Secondary | ICD-10-CM | POA: Insufficient documentation

## 2013-08-07 DIAGNOSIS — Z87891 Personal history of nicotine dependence: Secondary | ICD-10-CM | POA: Insufficient documentation

## 2013-08-07 DIAGNOSIS — L723 Sebaceous cyst: Secondary | ICD-10-CM

## 2013-08-07 DIAGNOSIS — I10 Essential (primary) hypertension: Secondary | ICD-10-CM | POA: Insufficient documentation

## 2013-08-07 DIAGNOSIS — L089 Local infection of the skin and subcutaneous tissue, unspecified: Secondary | ICD-10-CM

## 2013-08-07 DIAGNOSIS — F3289 Other specified depressive episodes: Secondary | ICD-10-CM | POA: Insufficient documentation

## 2013-08-07 DIAGNOSIS — Z792 Long term (current) use of antibiotics: Secondary | ICD-10-CM | POA: Insufficient documentation

## 2013-08-07 DIAGNOSIS — B373 Candidiasis of vulva and vagina: Secondary | ICD-10-CM

## 2013-08-07 DIAGNOSIS — M7989 Other specified soft tissue disorders: Secondary | ICD-10-CM | POA: Insufficient documentation

## 2013-08-07 DIAGNOSIS — Z8543 Personal history of malignant neoplasm of ovary: Secondary | ICD-10-CM | POA: Insufficient documentation

## 2013-08-07 DIAGNOSIS — F329 Major depressive disorder, single episode, unspecified: Secondary | ICD-10-CM | POA: Insufficient documentation

## 2013-08-07 DIAGNOSIS — IMO0002 Reserved for concepts with insufficient information to code with codable children: Secondary | ICD-10-CM | POA: Insufficient documentation

## 2013-08-07 DIAGNOSIS — K219 Gastro-esophageal reflux disease without esophagitis: Secondary | ICD-10-CM | POA: Insufficient documentation

## 2013-08-07 DIAGNOSIS — L02219 Cutaneous abscess of trunk, unspecified: Secondary | ICD-10-CM | POA: Insufficient documentation

## 2013-08-07 DIAGNOSIS — Z8739 Personal history of other diseases of the musculoskeletal system and connective tissue: Secondary | ICD-10-CM | POA: Insufficient documentation

## 2013-08-07 DIAGNOSIS — N760 Acute vaginitis: Secondary | ICD-10-CM | POA: Insufficient documentation

## 2013-08-07 DIAGNOSIS — Z7902 Long term (current) use of antithrombotics/antiplatelets: Secondary | ICD-10-CM | POA: Insufficient documentation

## 2013-08-07 DIAGNOSIS — E785 Hyperlipidemia, unspecified: Secondary | ICD-10-CM | POA: Insufficient documentation

## 2013-08-07 MED ORDER — FLUCONAZOLE 150 MG PO TABS: 150.0000 mg | ORAL_TABLET | Freq: Once | ORAL | Status: DC

## 2013-08-07 NOTE — ED Provider Notes (Signed)
CSN: [id number]     Arrival date & time 08/07/13  1603 History  This chart was scribed for non-physician practitioner Coral Ceo, PA-C, working with Richardean Canal, MD by Dorothey Baseman, ED Scribe. This patient was seen in room TR10C/TR10C and the patient's care was started at 5:21 PM.    Chief Complaint  Patient presents with  . Abscess   The history is provided by the patient. No language interpreter was used.   HPI Comments: [name] is a 72 y.o. female with a PMH of HLD, anemia, depression, HTN, CHF, GERD, OA, Hx of ovarian cancer, and CKD who presents to the Emergency Department complaining of an abscess.  Abscess is present in the mid-sternal chest with associated mild drainage, swelling, and constant pain for the past several days. Patient reports that she was seen here on 07/28/2013 for a similar abscess that was drained and that she received doxycycline then was switched to Augmentin on 07/31/2013, which she states she is still currently taking, but that the symptoms persist. Patient reports that she has had a knot to the area for several years and that the abscessed area worsened after her encounter on 07/28/2013. Patient was seen in the IM clinic today and sent to the ED for I&D.  Patient reports that she has taken Tramadol at home with mild, temporary relief of the pain. She denies any fever, abdominal pain, nausea, and vomiting. Patient reports a history of abscesses, however, none in the chest in the past.    Past Medical History  Diagnosis Date  . History of cervical cancer 1972  . Ovarian cancer 1972    S/P oophorectomy  . GERD (gastroesophageal reflux disease)   . Gout   . Hypertension   . DJD (degenerative joint disease), multiple sites     Low back pain worst  . DDD (degenerative disc disease), lumbosacral   . Osteoarthritis of hip     bilateral hips  . Hx-TIA (transient ischemic attack) 05/13/2001    Right facial numbness  . Sensorineural hearing loss of both ears    . Mitral valve prolapse 10/27/1999    Subsequent 2D echo show normal mitral valve  . Chronic venous insufficiency   . Colon, diverticulosis Sept. 2011    outpatient colonoscopy by Dr. [name].  Need record  . Hyperlipidemia   . Recurrent boils     History of MRSA skin infections with abscess  . Gout     Uric Acid level 4.2 on 300 allopurinol  . Depression    Past Surgical History  Procedure Laterality Date  . Joint replacement      bilateral hip replacement  . Abdominal hysterectomy      1972  . Oophorectomy      1972 for ovarian cancer  . Lumbar disc surgery      L5-S1 7/07  . Cervical discectomy  7/07    C5-C6   Family History  Problem Relation Age of Onset  . Diabetes Mother   . Heart disease Mother   . Hearing loss Mother   . Stroke Mother   . Schizophrenia Maternal Grandmother    History  Substance Use Topics  . Smoking status: Former Smoker -- 0.10 packs/day    Types: Cigarettes    Quit date: 04/08/2013  . Smokeless tobacco: Never Used     Comment: pt has started back smoking.  Does 2-4 cigs per day  . Alcohol Use: No   OB History   Grav Para Term  Preterm Abortions TAB SAB Ect Mult Living                 Review of Systems  Constitutional: Negative for fever, chills, activity change, appetite change and fatigue.  HENT: Negative for ear pain and sore throat.   Eyes: Negative for visual disturbance.  Respiratory: Negative for cough, shortness of breath and wheezing.   Cardiovascular: Positive for chest pain (focal (abscess)) and leg swelling (at baseline).  Gastrointestinal: Negative for nausea, vomiting and abdominal pain.  Genitourinary: Negative for dysuria.  Musculoskeletal: Negative for back pain, gait problem and neck pain.  Skin: Positive for color change and wound ( abscess to the mid-sternal chest).  Neurological: Negative for dizziness, weakness, light-headedness and headaches.    Allergies  Aspirin and Sulfamethoxazole-trimethoprim  Home  Medications   Current Outpatient Rx  Name  Route  Sig  Dispense  Refill  . albuterol (PROVENTIL HFA;VENTOLIN HFA) 108 (90 BASE) MCG/ACT inhaler   Inhalation   Inhale 2 puffs into the lungs every 6 (six) hours as needed for wheezing.   1 Inhaler   2   . allopurinol (ZYLOPRIM) 300 MG tablet   Oral   Take 1 tablet (300 mg total) by mouth daily.   30 tablet   6   . amLODipine (NORVASC) 5 MG tablet      TAKE 1 TABLET BY MOUTH EVERY DAY   90 tablet   3   . amoxicillin-clavulanate (AUGMENTIN) 875-125 MG per tablet   Oral   Take 1 tablet by mouth 2 (two) times daily.   20 tablet   0   . Ascorbic Acid (VITAMIN C) 500 MG tablet   Oral   Take 500 mg by mouth daily.           [name] atorvastatin (LIPITOR) 80 MG tablet   Oral   Take 1 tablet (80 mg total) by mouth daily.   30 tablet   6   . benzonatate (TESSALON) 100 MG capsule      TAKE 1 CAPSULE BY MOUTH TWICE DAILY AS NEEDED FOR COUGH   30 capsule   2   . Biotin (BIOTIN 5000) 5 MG CAPS   Oral   Take 1 capsule by mouth daily.           [name] buPROPion (WELLBUTRIN SR) 150 MG 12 hr tablet      TAKE 1 TABLET TWICE DAILY UNTIL YOUR FOLLOW UP   180 tablet   3   . Calcium Carbonate-Vitamin D (CALCIUM-VITAMIN D) 500-200 MG-UNIT per tablet   Oral   Take 1 tablet by mouth daily.           . clopidogrel (PLAVIX) 75 MG tablet   Oral   Take 1 tablet (75 mg total) by mouth daily.   90 tablet   3   . conjugated estrogens (PREMARIN) vaginal cream   Vaginal   Place vaginally at bedtime.   90 g   12   . diphenhydrAMINE (SOMINEX) 25 MG tablet   Oral   Take 25 mg by mouth at bedtime as needed for sleep.         . fluconazole (DIFLUCAN) 150 MG tablet   Oral   Take 1 tablet (150 mg total) by mouth once.   1 tablet   1   . fluocinonide cream (LIDEX) 0.05 %   Topical   Apply 1 application topically 2 (two) times daily.   60 g   2   . fluticasone (  FLONASE) 50 MCG/ACT nasal spray   Nasal   Place 2 sprays into the  nose daily.   16 g   6   . Fluticasone-Salmeterol (ADVAIR DISKUS) 250-50 MCG/DOSE AEPB   Inhalation   Inhale 1 puff into the lungs 2 (two) times daily.   60 each   1   . furosemide (LASIX) 20 MG tablet   Oral   Take 1 tablet (20 mg total) by mouth daily. Okay to use one more tablet daily for increased swelling as needed.   45 tablet   3   . losartan (COZAAR) 100 MG tablet      TAKE 1 TABLET BY MOUTH DAILY   30 tablet   6   . nystatin (MYCOSTATIN) powder      APPLY TO AFFECTED AREA TWICE DAILY   60 g   1   . oxybutynin (DITROPAN-XL) 5 MG 24 hr tablet   Oral   Take 1 tablet (5 mg total) by mouth at bedtime.   90 tablet   1   . pantoprazole (PROTONIX) 40 MG tablet   Oral   Take 1 tablet (40 mg total) by mouth daily.   30 tablet   6   . polyethylene glycol (MIRALAX / GLYCOLAX) packet   Oral   Take 17 g by mouth daily as needed.   30 each   6   . potassium chloride (KLOR-CON 10) 10 MEQ tablet   Oral   Take 1 tablet (10 mEq total) by mouth daily.   90 tablet   1   . traMADol (ULTRAM) 50 MG tablet      TAKE 1 TABLET BY MOUTH TWICE DAILY   60 tablet   2    Triage Vitals: BP 157/72  Pulse 78  Temp(Src) 98.3 F (36.8 C) (Oral)  Resp 14  Wt 193 lb (87.544 kg)  BMI 34.2 kg/m2  SpO2 98%  Filed Vitals:   08/07/13 1613  BP: 157/72  Pulse: 78  Temp: 98.3 F (36.8 C)  TempSrc: Oral  Resp: 14  Weight: 193 lb (87.544 kg)  SpO2: 98%    Physical Exam  Nursing note and vitals reviewed. Constitutional: She is oriented to person, place, and time. She appears well-developed and well-nourished. No distress.  HENT:  Head: Normocephalic and atraumatic.  Right Ear: External ear normal.  Left Ear: External ear normal.  Nose: Nose normal.  Eyes: Conjunctivae are normal. Right eye exhibits no discharge. Left eye exhibits no discharge.  Neck: Normal range of motion. Neck supple.  Cardiovascular: Normal rate, regular rhythm and normal heart sounds.  Exam  reveals no gallop and no friction rub.   No murmur heard. Pulmonary/Chest: Effort normal and breath sounds normal. No respiratory distress. She has no wheezes. She has no rales. She exhibits no tenderness.    Abdominal: Soft. She exhibits no distension. There is no tenderness.  Musculoskeletal: Normal range of motion. She exhibits edema. She exhibits no tenderness.  Trace pitting pedal edema bilaterally (at baseline)   Neurological: She is alert and oriented to person, place, and time.  Skin: Skin is warm and dry. She is not diaphoretic.  3 cm circular fluctuant abscess to the mid-sternal chest with evidence of grey purulent drainage from a pinpoint open wound. No surrounding erythema.    Psychiatric: She has a normal mood and affect. Her behavior is normal.    ED Course  Procedures (including critical care time)  DIAGNOSTIC STUDIES: Oxygen Saturation is 98% on room air,  normal by my interpretation.    COORDINATION OF CARE: 5:31PM- Will consult with the attending physician to see if the area needs to be incised and drained today in the ED. Discussed treatment plan with patient at bedside and patient verbalized agreement.   INCISION AND DRAINAGE PROCEDURE NOTE: Patient identification was confirmed and verbal consent was obtained. This procedure was performed by Coral Ceo, PA-C at 6:26 PM. Site: mid-sternal chest Sterile procedures observed Needle size: 23 Anesthetic used (type and amt): 2% lidocaine without epinephrine, 3 mL Blade size: 11 Drainage: scant, prulent Complexity: simple Site anesthetized, incision made over site, wound drained and explored loculations, rinsed with copious amounts of normal saline, wound packed with sterile gauze, covered with dry, sterile dressing.  Pt tolerated procedure well without complications.  Instructions for care discussed verbally and pt provided with additional written instructions for homecare and f/u.   Labs Review Labs Reviewed  - No data to display Imaging Review No results found.  EKG Interpretation   None       MDM   1. Abscess of chest wall     [name] is a 72 y.o. female with a PMH of HLD, anemia, depression, HTN, CHF, GERD, OA, Hx of ovarian cancer, and CKD who presents to the Emergency Department complaining of an abscess.     Abscess was I&D in the ED. Patient had cultures done previously, which were not repeated today.  She was switched from doxycycline to Augmentin (10/2), which she was instructed to continue to take. Patient was instructed to return to the ED if they experience any increased drainage, spreading redness/swelling, fever, severe pain, or other concerns. She was instructed to follow-up with her PCP again for wound re-check and make an appointment with general surgery for further evaluation and management. She likely has a lipoma which developed a surrounding abscess, which may need surgical removal. Patient was in agreement with discharge and plan.     Final impressions: 1. Abscess chest wall     Luiz Iron PA-C   This patient was discussed with Dr. Silverio Lay   I personally performed the services described in this documentation, which was scribed in my presence. The recorded information has been reviewed and is accurate.       Jillyn Ledger, PA-C 08/09/13 1226

## 2013-08-07 NOTE — Addendum Note (Signed)
Addended by: Charlsie Merles F on: 08/07/2013 03:45 PM   Modules accepted: Orders

## 2013-08-07 NOTE — Telephone Encounter (Signed)
meds have been refilled  

## 2013-08-07 NOTE — ED Notes (Signed)
Pt. reports abscess at mid chest for several days with drainage.

## 2013-08-07 NOTE — Assessment & Plan Note (Addendum)
The cyst turned abscess was evaluated on 9/29 and I&D'd. She was started on doxycycline. Cultures returned positive for Proteus, and the doxycycline was discontinued and she started on Augmentin on 10/2. She presents today with continued tenderness and fluctuance at the abscess site. She endorses continued "grey" colored drainage that has improved since the I&D, and states that her pain is improved. Today the site is fluctuant, and I am able to express purulent fluid from the abscess site. Sending the patient to the ED for a repeat I&D. Did attempt to get the patient to see a general surgeon at [facility] surgery; however she wanted to go ahead and be seen in the ED.

## 2013-08-07 NOTE — Progress Notes (Signed)
Patient ID: [name], female   DOB: [birth year], 72 y.o.   MRN: [id number]  Subjective:   [name]   DOB: 07-23-1941 72 y.o.   MRN: [id number]  HPI: [name] is a 72 y.o. F with PMH CHF, COPD, gout, HLD, HTN, and an epidermal cyst presents to the clinic for a f/u s/p I&D of the cyst after transformation into an abscess on her chest wall.   Please see Problem List based HPI    Past Medical History  Diagnosis Date  . History of cervical cancer 1972  . Ovarian cancer 1972    S/P oophorectomy  . GERD (gastroesophageal reflux disease)   . Gout   . Hypertension   . DJD (degenerative joint disease), multiple sites     Low back pain worst  . DDD (degenerative disc disease), lumbosacral   . Osteoarthritis of hip     bilateral hips  . Hx-TIA (transient ischemic attack) 05/13/2001    Right facial numbness  . Sensorineural hearing loss of both ears   . Mitral valve prolapse 10/27/1999    Subsequent 2D echo show normal mitral valve  . Chronic venous insufficiency   . Colon, diverticulosis Sept. 2011    outpatient colonoscopy by Dr. [name].  Need record  . Hyperlipidemia   . Recurrent boils     History of MRSA skin infections with abscess  . Gout     Uric Acid level 4.2 on 300 allopurinol  . Depression    Current Outpatient Prescriptions  Medication Sig Dispense Refill  . albuterol (PROVENTIL HFA;VENTOLIN HFA) 108 (90 BASE) MCG/ACT inhaler Inhale 2 puffs into the lungs every 6 (six) hours as needed for wheezing.  1 Inhaler  2  . allopurinol (ZYLOPRIM) 300 MG tablet Take 1 tablet (300 mg total) by mouth daily.  30 tablet  6  . amLODipine (NORVASC) 5 MG tablet TAKE 1 TABLET BY MOUTH EVERY DAY  90 tablet  3  . amoxicillin-clavulanate (AUGMENTIN) 875-125 MG per tablet Take 1 tablet by mouth 2 (two) times daily.  20 tablet  0  . Ascorbic Acid (VITAMIN C) 500 MG tablet Take 500 mg by mouth daily.        [name] atorvastatin (LIPITOR) 80 MG tablet Take  1 tablet (80 mg total) by mouth daily.  30 tablet  6  . benzonatate (TESSALON) 100 MG capsule TAKE 1 CAPSULE BY MOUTH TWICE DAILY AS NEEDED FOR COUGH  30 capsule  2  . Biotin (BIOTIN 5000) 5 MG CAPS Take 1 capsule by mouth daily.        [name] buPROPion (WELLBUTRIN SR) 150 MG 12 hr tablet TAKE 1 TABLET TWICE DAILY UNTIL YOUR FOLLOW UP  180 tablet  3  . Calcium Carbonate-Vitamin D (CALCIUM-VITAMIN D) 500-200 MG-UNIT per tablet Take 1 tablet by mouth daily.        . clopidogrel (PLAVIX) 75 MG tablet Take 1 tablet (75 mg total) by mouth daily.  90 tablet  3  . conjugated estrogens (PREMARIN) vaginal cream Place vaginally at bedtime.  90 g  12  . diphenhydrAMINE (SOMINEX) 25 MG tablet Take 25 mg by mouth at bedtime as needed for sleep.      . fluocinonide cream (LIDEX) 0.05 % Apply 1 application topically 2 (two) times daily.  60 g  2  . fluticasone (FLONASE) 50 MCG/ACT nasal spray Place 2 sprays into the nose daily.  16 g  6  . Fluticasone-Salmeterol (ADVAIR DISKUS) 250-50  MCG/DOSE AEPB Inhale 1 puff into the lungs 2 (two) times daily.  60 each  1  . furosemide (LASIX) 20 MG tablet Take 1 tablet (20 mg total) by mouth daily. Okay to use one more tablet daily for increased swelling as needed.  45 tablet  3  . losartan (COZAAR) 100 MG tablet TAKE 1 TABLET BY MOUTH DAILY  30 tablet  6  . nystatin (MYCOSTATIN) powder APPLY TO AFFECTED AREA TWICE DAILY  60 g  1  . oxybutynin (DITROPAN-XL) 5 MG 24 hr tablet Take 1 tablet (5 mg total) by mouth at bedtime.  90 tablet  1  . pantoprazole (PROTONIX) 40 MG tablet Take 1 tablet (40 mg total) by mouth daily.  30 tablet  6  . polyethylene glycol (MIRALAX / GLYCOLAX) packet Take 17 g by mouth daily as needed.  30 each  6  . potassium chloride (KLOR-CON 10) 10 MEQ tablet Take 1 tablet (10 mEq total) by mouth daily.  90 tablet  1  . traMADol (ULTRAM) 50 MG tablet TAKE 1 TABLET BY MOUTH TWICE DAILY  60 tablet  2  . fluconazole (DIFLUCAN) 150 MG tablet Take 1 tablet (150 mg  total) by mouth once.  1 tablet  1  . [DISCONTINUED] oxybutynin (DITROPAN-XL) 5 MG 24 hr tablet Take 1 tablet (5 mg total) by mouth at bedtime.  30 tablet  6   No current facility-administered medications for this visit.   Family History  Problem Relation Age of Onset  . Diabetes Mother   . Heart disease Mother   . Hearing loss Mother   . Stroke Mother   . Schizophrenia Maternal Grandmother    History   Social History  . Marital Status: Widowed    Spouse Name: N/A    Number of Children: N/A  . Years of Education: N/A   Social History Main Topics  . Smoking status: Former Smoker -- 0.10 packs/day    Types: Cigarettes    Quit date: 04/08/2013  . Smokeless tobacco: Never Used     Comment: pt has started back smoking.  Does 2-4 cigs per day  . Alcohol Use: No  . Drug Use: No  . Sexual Activity: No   Other Topics Concern  . None   Social History Narrative  . None   Review of Systems: Constitutional: Denies fever, chills, diaphoresis, appetite change and fatigue.  HEENT: +trouble with vision and with hearing that has been ongoing, not acute. Respiratory: Denies SOB, DOE, cough, chest tightness, and wheezing.   Cardiovascular: Denies chest pain, palpitations and leg swelling.  Gastrointestinal: Denies nausea, vomiting, abdominal pain, diarrhea, constipation, blood in stool and abdominal distention.  Genitourinary: Denies dysuria, urgency, frequency, hematuria, flank pain and difficulty urinating.  Endocrine: Denies: hot or cold intolerance, sweats, changes in hair or nails, polyuria, polydipsia. Musculoskeletal: Denies myalgias, back pain, joint swelling, arthralgias and gait problem.  Skin: C/o of tenderness and pain with palpation of her abscess site on her chest Neurological: Denies dizziness, seizures, syncope, weakness, light-headedness, numbness and headaches.  Psychiatric/Behavioral: Denies suicidal ideation, mood changes, confusion, nervousness, sleep disturbance and  agitation  Objective:  Physical Exam: Filed Vitals:   08/07/13 1417  BP: 157/77  Pulse: 75  Temp: 97 F (36.1 C)  TempSrc: Oral  Weight: 194 lb 12.8 oz (88.361 kg)  SpO2: 97%   Constitutional: Vital signs reviewed.  Patient is a well-developed and well-nourished female in no acute distress and cooperative with exam. Alert.  Head: Normocephalic and  atraumatic Eyes: PERRL, EOMI, conjunctivae normal, No scleral icterus.  Neck: Supple, Trachea midline normal ROM Cardiovascular: RRR Pulmonary: normal respiratory effort, CTAB Abdominal: Soft. Non-tender, non-distended, bowel sounds are normal, no masses, organomegaly, or guarding present.  GU: On pelvic exam, significant amounts of white to off white discharge within the vaginal vault. Musculoskeletal: No joint deformities, erythema. Neurological: A&O x3, nonfocal Skin: Abscess located at the mid chest above the breasts. Site is TTP and fluctuant with a small amount of purulence able to be expressed.   Psychiatric: Normal mood and affect. speech and behavior is normal.   Assessment & Plan:   Please refer to Problem List based Assessment and Plan

## 2013-08-07 NOTE — Assessment & Plan Note (Addendum)
Per pt, she frequently gets yeast infections after taking antibiotics. She currently endorses burring and white, foul smelling vagina discharge that began after starting the Augmentin. She does have white discharge in her vaginal vault, but she is also using Premarin cream, which could be causing the discharge-looking substance as well. Samples were obtained for a wet prep. Will go ahead and treat with Fluconazole 150mg  po x1 now.   08/12/13:  Addendum:  Wet prep positive for candidiasis. Pt given rx for Fluconazole at her appointment.

## 2013-08-07 NOTE — Patient Instructions (Signed)
**  Keep your appointment with me on Oct 30th, but if the place on your chest worsens, please call the clinic to be seen earlier.   **Take the Fluconazole. It is one pill to be taken today. This should help with the vaginal burring and itching

## 2013-08-08 NOTE — Progress Notes (Signed)
Case discussed with Dr. Glenn at the time of the visit.  We reviewed the resident's history and exam and pertinent patient test results.  I agree with the assessment, diagnosis, and plan of care documented in the resident's note.   

## 2013-08-09 NOTE — ED Provider Notes (Signed)
Medical screening examination/treatment/procedure(s) were conducted as a shared visit with non-physician practitioner(s) and myself.  I personally evaluated the patient during the encounter  [name] is a 72 y.o. female here with chest abscess. Has chronic abscess that was more swollen recently. Attempted I&D a week ago in the clinic and has been on antibiotics. She is sent here from clinic because the abscess increased in size and has purulent discharge. The abscess is fluctuant. I&D performed by PA. Stable for d/c.    Richardean Canal, MD 08/09/13 [phone]

## 2013-08-11 ENCOUNTER — Encounter: Payer: Self-pay | Admitting: Internal Medicine

## 2013-08-18 ENCOUNTER — Telehealth: Payer: Self-pay | Admitting: *Deleted

## 2013-08-18 DIAGNOSIS — B373 Candidiasis of vulva and vagina: Secondary | ICD-10-CM

## 2013-08-18 MED ORDER — FLUCONAZOLE 150 MG PO TABS: 150.0000 mg | ORAL_TABLET | ORAL | Status: DC

## 2013-08-18 NOTE — Telephone Encounter (Signed)
Pt was called and informed of rx.

## 2013-08-18 NOTE — Telephone Encounter (Signed)
For her recurrent vaginal candidiasis, I am prescribing Fluconazole 150mg  tablet for her to take every 72 hours for 2 doses. I have sent this in to her pharmacy at [facility]. Please notify the patient. Thanks.

## 2013-08-18 NOTE — Telephone Encounter (Signed)
Message per pt - stated she took Diflucan x1 as prescribed for itching/burning now since has completed the antibiotics, she has itching/burning again. Requesting another rx for Diflucan. Thanks

## 2013-08-21 ENCOUNTER — Encounter: Payer: Self-pay | Admitting: Internal Medicine

## 2013-08-22 NOTE — Addendum Note (Signed)
Addended by: Neomia Dear on: 08/22/2013 07:54 AM   Modules accepted: Orders

## 2013-08-26 ENCOUNTER — Encounter (INDEPENDENT_AMBULATORY_CARE_PROVIDER_SITE_OTHER): Payer: Self-pay

## 2013-08-26 ENCOUNTER — Ambulatory Visit (INDEPENDENT_AMBULATORY_CARE_PROVIDER_SITE_OTHER): Payer: Medicare Other | Admitting: General Surgery

## 2013-08-26 ENCOUNTER — Encounter (INDEPENDENT_AMBULATORY_CARE_PROVIDER_SITE_OTHER): Payer: Self-pay | Admitting: General Surgery

## 2013-08-26 VITALS — BP 130/76 | HR 68 | Temp 97.8°F | Resp 14 | Ht 63.0 in | Wt 189.8 lb

## 2013-08-26 DIAGNOSIS — L723 Sebaceous cyst: Secondary | ICD-10-CM

## 2013-08-26 DIAGNOSIS — L720 Epidermal cyst: Secondary | ICD-10-CM

## 2013-08-27 ENCOUNTER — Other Ambulatory Visit: Payer: Self-pay | Admitting: *Deleted

## 2013-08-27 MED ORDER — BUPROPION HCL ER (SR) 150 MG PO TB12: 150.0000 mg | ORAL_TABLET | Freq: Two times a day (BID) | ORAL | Status: DC

## 2013-08-27 NOTE — Progress Notes (Signed)
Patient ID: [name], female   DOB: Mar 06, 1941, 72 y.o.   MRN: [id number]  Chief Complaint  Patient presents with  . New Evaluation    eval chest wall abscess    HPI [name] is a 72 y.o. female.  Referred by Dr Charlsie Merles HPI 44 yof with multiple medical problems who recently has had infected sebaceous cyst on her chest wall overlying her sternum. This has healed but the large cyst remains and she would like it removed. She comes in today to discuss this. She has no drainage from it now.  No fevers.  No real tenderness.  Past Medical History  Diagnosis Date  . History of cervical cancer 1972  . Ovarian cancer 1972    S/P oophorectomy  . GERD (gastroesophageal reflux disease)   . Gout   . Hypertension   . DJD (degenerative joint disease), multiple sites     Low back pain worst  . DDD (degenerative disc disease), lumbosacral   . Osteoarthritis of hip     bilateral hips  . Hx-TIA (transient ischemic attack) 05/13/2001    Right facial numbness  . Sensorineural hearing loss of both ears   . Mitral valve prolapse 10/27/1999    Subsequent 2D echo show normal mitral valve  . Chronic venous insufficiency   . Colon, diverticulosis Sept. 2011    outpatient colonoscopy by Dr. [name].  Need record  . Hyperlipidemia   . Recurrent boils     History of MRSA skin infections with abscess  . Gout     Uric Acid level 4.2 on 300 allopurinol  . Depression     Past Surgical History  Procedure Laterality Date  . Joint replacement      bilateral hip replacement  . Abdominal hysterectomy      1972  . Oophorectomy      1972 for ovarian cancer  . Lumbar disc surgery      L5-S1 7/07  . Cervical discectomy  7/07    C5-C6    Family History  Problem Relation Age of Onset  . Diabetes Mother   . Heart disease Mother   . Hearing loss Mother   . Stroke Mother   . Schizophrenia Maternal Grandmother     Social History History  Substance Use Topics  . Smoking status:  Former Smoker -- 0.10 packs/day    Types: Cigarettes    Quit date: 04/08/2013  . Smokeless tobacco: Never Used     Comment: pt has started back smoking.  Does 2-4 cigs per day  . Alcohol Use: No    Allergies  Allergen Reactions  . Aspirin     Unknown / childhood  . Sulfamethoxazole-Trimethoprim Itching    Current Outpatient Prescriptions  Medication Sig Dispense Refill  . albuterol (PROVENTIL HFA;VENTOLIN HFA) 108 (90 BASE) MCG/ACT inhaler Inhale 2 puffs into the lungs every 6 (six) hours as needed for wheezing.  1 Inhaler  2  . allopurinol (ZYLOPRIM) 300 MG tablet Take 1 tablet (300 mg total) by mouth daily.  30 tablet  6  . amLODipine (NORVASC) 5 MG tablet Take 5 mg by mouth daily.      [name] amoxicillin-clavulanate (AUGMENTIN) 875-125 MG per tablet       . Ascorbic Acid (VITAMIN C) 500 MG tablet Take 500 mg by mouth daily.        [name] atorvastatin (LIPITOR) 80 MG tablet Take 1 tablet (80 mg total) by mouth daily.  30 tablet  6  .  benzonatate (TESSALON) 100 MG capsule Take 100 mg by mouth 2 (two) times daily as needed for cough.      . Biotin (BIOTIN 5000) 5 MG CAPS Take 1 capsule by mouth daily.        [name] buPROPion (WELLBUTRIN SR) 150 MG 12 hr tablet Take 1 tablet (150 mg total) by mouth 2 (two) times daily.  30 tablet  5  . Calcium Carbonate-Vitamin D (CALCIUM-VITAMIN D) 500-200 MG-UNIT per tablet Take 1 tablet by mouth daily.        . clopidogrel (PLAVIX) 75 MG tablet Take 1 tablet (75 mg total) by mouth daily.  90 tablet  3  . conjugated estrogens (PREMARIN) vaginal cream Place vaginally at bedtime.  90 g  12  . diphenhydrAMINE (SOMINEX) 25 MG tablet Take 25 mg by mouth at bedtime as needed for sleep.      . fluconazole (DIFLUCAN) 150 MG tablet Take 1 tablet (150 mg total) by mouth every 3 (three) days. For 2 doses  2 tablet  1  . fluocinonide cream (LIDEX) 0.05 % Apply 1 application topically 2 (two) times daily.  60 g  2  . fluticasone (FLONASE) 50 MCG/ACT nasal spray Place 2 sprays  into the nose daily.  16 g  6  . Fluticasone-Salmeterol (ADVAIR DISKUS) 250-50 MCG/DOSE AEPB Inhale 1 puff into the lungs 2 (two) times daily.  60 each  1  . furosemide (LASIX) 20 MG tablet Take 1 tablet (20 mg total) by mouth daily. Okay to use one more tablet daily for increased swelling as needed.  45 tablet  3  . losartan (COZAAR) 100 MG tablet Take 100 mg by mouth daily.      [name] nystatin (MYCOSTATIN) powder Apply 1 g topically 2 (two) times daily. Apply to affected area twice daily      . oxybutynin (DITROPAN-XL) 5 MG 24 hr tablet Take 1 tablet (5 mg total) by mouth at bedtime.  90 tablet  1  . pantoprazole (PROTONIX) 40 MG tablet Take 1 tablet (40 mg total) by mouth daily.  30 tablet  6  . polyethylene glycol (MIRALAX / GLYCOLAX) packet Take 17 g by mouth daily as needed (constipation).      . potassium chloride (KLOR-CON 10) 10 MEQ tablet Take 1 tablet (10 mEq total) by mouth daily.  90 tablet  1  . traMADol (ULTRAM) 50 MG tablet Take 50 mg by mouth 2 (two) times daily.      . [DISCONTINUED] oxybutynin (DITROPAN-XL) 5 MG 24 hr tablet Take 1 tablet (5 mg total) by mouth at bedtime.  30 tablet  6   No current facility-administered medications for this visit.    Review of Systems Review of Systems  Constitutional: Negative for fever, chills and unexpected weight change.  HENT: Negative for congestion, hearing loss, sore throat, trouble swallowing and voice change.   Eyes: Negative for visual disturbance.  Respiratory: Negative for cough and wheezing.   Cardiovascular: Negative for chest pain, palpitations and leg swelling.  Gastrointestinal: Negative for nausea, vomiting, abdominal pain, diarrhea, constipation, blood in stool, abdominal distention and anal bleeding.  Genitourinary: Negative for hematuria, vaginal bleeding and difficulty urinating.  Musculoskeletal: Negative for arthralgias.  Skin: Negative for rash and wound.  Neurological: Negative for seizures, syncope and headaches.    Hematological: Negative for adenopathy. Does not bruise/bleed easily.  Psychiatric/Behavioral: Negative for confusion.    Blood pressure 130/76, pulse 68, temperature 97.8 F (36.6 C), temperature source Temporal, resp. rate 14, height  5\' 3"  (1.6 m), weight 189 lb 12.8 oz (86.093 kg).  Physical Exam Physical Exam  Vitals reviewed. Constitutional: She appears well-developed and well-nourished.  Cardiovascular: Normal rate, regular rhythm and normal heart sounds.   Pulmonary/Chest: Effort normal and breath sounds normal. She has no wheezes. She has no rales.    Lymphadenopathy:    She has no cervical adenopathy.    Data Reviewed Ed and clinic notes   Assessment    Chest wall sebaceous cyst     Plan    This has history of infection and is fairly large.  I dont think I can do in office.  We discussed observation which would be reasonable given her comorbidities but she very much wants this removed due to the significant infection and she certainly is at risk for that again.  We discussed excising this under local mac in the OR.  Will get clearance and recs for plavix mgt first.  Risks were discussed including infection or having an open wound.  She understands and will await recs prior to scheduling.        Parisa Pinela 08/27/2013, 8:39 PM

## 2013-08-28 ENCOUNTER — Encounter: Payer: Self-pay | Admitting: Internal Medicine

## 2013-08-28 ENCOUNTER — Ambulatory Visit (INDEPENDENT_AMBULATORY_CARE_PROVIDER_SITE_OTHER): Payer: Medicare Other | Admitting: Internal Medicine

## 2013-08-28 ENCOUNTER — Encounter (INDEPENDENT_AMBULATORY_CARE_PROVIDER_SITE_OTHER): Payer: Self-pay

## 2013-08-28 VITALS — BP 138/76 | HR 71 | Temp 97.2°F | Ht 63.0 in | Wt 192.8 lb

## 2013-08-28 DIAGNOSIS — I10 Essential (primary) hypertension: Secondary | ICD-10-CM

## 2013-08-28 DIAGNOSIS — L723 Sebaceous cyst: Secondary | ICD-10-CM

## 2013-08-28 DIAGNOSIS — L089 Local infection of the skin and subcutaneous tissue, unspecified: Secondary | ICD-10-CM

## 2013-08-28 DIAGNOSIS — B373 Candidiasis of vulva and vagina: Secondary | ICD-10-CM

## 2013-08-28 NOTE — Assessment & Plan Note (Signed)
BP Readings from Last 3 Encounters:  08/28/13 138/76  08/26/13 130/76  08/07/13 157/72    Lab Results  Component Value Date   NA 140 03/14/2013   K 3.9 03/14/2013   CREATININE 1.54* 03/14/2013    Assessment: Blood pressure control: controlled Progress toward BP goal:  at goal   Plan: Medications:  continue current medications Educational resources provided: brochure;handout;video Self management tools provided:   Other plans: F/u w/in 1 mo

## 2013-08-28 NOTE — Progress Notes (Signed)
Patient ID: [name], female   DOB: Jun 20, 1941, 72 y.o.   MRN: [id number]  Subjective:   [name]   DOB: 09/15/1941 72 y.o.   MRN: [id number]  HPI: [name] is a 72 y.o. F w/ PMH CHF, COPD, gout, HLD, HTN, and a sebaceous cyst presents to the clinic for a f/u after being seen with an infected sebaceous cyst and vaginal candidiasis.  She was seen in the clinic on 08/07/13 with an infected sebaceous cyst that required further I&D. It was I&D'd at a previous visit and she was given antibiotics. Unfortunately when I saw her it was continuing to drain purulent material. She was sent to the ED for further evaluation, and a repeat I&D was performed. She was referred to general surgery, and saw Dr. Emelia Drake on 08/18/13. He to discussed with the patient possibly doing an I&D under MAC.   On clinic on 08/07/13, she was complaining of vaginal discharge. A speculum exam was performed and the wet prep was positive for candidiasis. She been was treated with Fluconazole 150 mg x1, but called back on 10/20 complaining of new onset vaginal itching and burning. Fluconazole 150 mg q 72 hours x2 doses was called into the pharmacy. She denies any additional vaginal burning/itching  Pt to have cataract surgery on 11/18.   Past Medical History  Diagnosis Date  . History of cervical cancer 1972  . Ovarian cancer 1972    S/P oophorectomy  . GERD (gastroesophageal reflux disease)   . Gout   . Hypertension   . DJD (degenerative joint disease), multiple sites     Low back pain worst  . DDD (degenerative disc disease), lumbosacral   . Osteoarthritis of hip     bilateral hips  . Hx-TIA (transient ischemic attack) 05/13/2001    Right facial numbness  . Sensorineural hearing loss of both ears   . Mitral valve prolapse 10/27/1999    Subsequent 2D echo show normal mitral valve  . Chronic venous insufficiency   . Colon, diverticulosis Sept. 2011    outpatient  colonoscopy by Dr. [name].  Need record  . Hyperlipidemia   . Recurrent boils     History of MRSA skin infections with abscess  . Gout     Uric Acid level 4.2 on 300 allopurinol  . Depression    Current Outpatient Prescriptions  Medication Sig Dispense Refill  . albuterol (PROVENTIL HFA;VENTOLIN HFA) 108 (90 BASE) MCG/ACT inhaler Inhale 2 puffs into the lungs every 6 (six) hours as needed for wheezing.  1 Inhaler  2  . allopurinol (ZYLOPRIM) 300 MG tablet Take 1 tablet (300 mg total) by mouth daily.  30 tablet  6  . amLODipine (NORVASC) 5 MG tablet Take 5 mg by mouth daily.      [name] amoxicillin-clavulanate (AUGMENTIN) 875-125 MG per tablet       . Ascorbic Acid (VITAMIN C) 500 MG tablet Take 500 mg by mouth daily.        [name] atorvastatin (LIPITOR) 80 MG tablet Take 1 tablet (80 mg total) by mouth daily.  30 tablet  6  . benzonatate (TESSALON) 100 MG capsule Take 100 mg by mouth 2 (two) times daily as needed for cough.      . Biotin (BIOTIN 5000) 5 MG CAPS Take 1 capsule by mouth daily.        [name] buPROPion (WELLBUTRIN SR) 150 MG 12 hr tablet Take 1 tablet (150 mg total) by mouth  2 (two) times daily.  30 tablet  5  . Calcium Carbonate-Vitamin D (CALCIUM-VITAMIN D) 500-200 MG-UNIT per tablet Take 1 tablet by mouth daily.        . clopidogrel (PLAVIX) 75 MG tablet Take 1 tablet (75 mg total) by mouth daily.  90 tablet  3  . conjugated estrogens (PREMARIN) vaginal cream Place vaginally at bedtime.  90 g  12  . diphenhydrAMINE (SOMINEX) 25 MG tablet Take 25 mg by mouth at bedtime as needed for sleep.      . fluconazole (DIFLUCAN) 150 MG tablet Take 1 tablet (150 mg total) by mouth every 3 (three) days. For 2 doses  2 tablet  1  . fluocinonide cream (LIDEX) 0.05 % Apply 1 application topically 2 (two) times daily.  60 g  2  . fluticasone (FLONASE) 50 MCG/ACT nasal spray Place 2 sprays into the nose daily.  16 g  6  . Fluticasone-Salmeterol (ADVAIR DISKUS) 250-50 MCG/DOSE AEPB Inhale 1 puff into the  lungs 2 (two) times daily.  60 each  1  . furosemide (LASIX) 20 MG tablet Take 1 tablet (20 mg total) by mouth daily. Okay to use one more tablet daily for increased swelling as needed.  45 tablet  3  . losartan (COZAAR) 100 MG tablet Take 100 mg by mouth daily.      [name] nystatin (MYCOSTATIN) powder Apply 1 g topically 2 (two) times daily. Apply to affected area twice daily      . oxybutynin (DITROPAN-XL) 5 MG 24 hr tablet Take 1 tablet (5 mg total) by mouth at bedtime.  90 tablet  1  . pantoprazole (PROTONIX) 40 MG tablet Take 1 tablet (40 mg total) by mouth daily.  30 tablet  6  . polyethylene glycol (MIRALAX / GLYCOLAX) packet Take 17 g by mouth daily as needed (constipation).      . potassium chloride (KLOR-CON 10) 10 MEQ tablet Take 1 tablet (10 mEq total) by mouth daily.  90 tablet  1  . traMADol (ULTRAM) 50 MG tablet Take 50 mg by mouth 2 (two) times daily.      . [DISCONTINUED] oxybutynin (DITROPAN-XL) 5 MG 24 hr tablet Take 1 tablet (5 mg total) by mouth at bedtime.  30 tablet  6   No current facility-administered medications for this visit.   Family History  Problem Relation Age of Onset  . Diabetes Mother   . Heart disease Mother   . Hearing loss Mother   . Stroke Mother   . Schizophrenia Maternal Grandmother    History   Social History  . Marital Status: Widowed    Spouse Name: N/A    Number of Children: N/A  . Years of Education: N/A   Social History Main Topics  . Smoking status: Former Smoker -- 0.10 packs/day    Types: Cigarettes    Quit date: 04/08/2013  . Smokeless tobacco: Never Used     Comment: pt has started back smoking.  Does 2-4 cigs per day  . Alcohol Use: No  . Drug Use: No  . Sexual Activity: No   Other Topics Concern  . None   Social History Narrative  . None   Review of Systems: Constitutional: Denies fever, chills. + fatigue.  HEENT: +trouble with vision and with hearing that has been ongoing, not acute.  Respiratory: Denies SOB, DOE,  cough, chest tightness, and wheezing.  Cardiovascular: Denies chest pain.  Gastrointestinal: Denies nausea, vomiting, abdominal pain, diarrhea, constipation Genitourinary: Denies urinary sx.  Denies vaginal itch, burning, odor Skin: C/o of tenderness and pain with palpation of her abscess site on her chest  Neurological: Denies dizziness, seizures, syncope Psychiatric/Behavioral: Denies suicidal ideation, mood changes  Objective:  Physical Exam: Filed Vitals:   08/28/13 1321  BP: 138/76  Pulse: 71  Temp: 97.2 F (36.2 C)  TempSrc: Oral  Height: 5\' 3"  (1.6 m)  Weight: 192 lb 12.8 oz (87.454 kg)  SpO2: 96%   Constitutional: Vital signs reviewed. Patient is a well-developed and well-nourished female in no acute distress and cooperative with exam.  Head: Normocephalic and atraumatic  Eyes: PERRL, EOMI, conjunctivae normal, No scleral icterus.  Cardiovascular: RRR  Pulmonary: normal respiratory effort, CTAB  Abdominal: Soft. Non-tender, non-distended Musculoskeletal: No joint deformities Neurological: A&O x3, nonfocal, CN 2-12 intact Skin: Cyst located at the mid chest above the breasts. Site is non-TTP and w/o fluctuance. No purulence noted.  Psychiatric: Normal mood and affect. Tangential speech   Assessment & Plan:   Please refer to Problem List based Assessment and Plan

## 2013-08-28 NOTE — Assessment & Plan Note (Addendum)
Patient treated for vaginal candidiasis x2 secondary to incomplete treatment on 10/9. Today she denies any vaginal burring or itching.

## 2013-08-28 NOTE — Assessment & Plan Note (Addendum)
Patient had a surgical consult on 08/26/13, and the surgeon discussed possibly doing a resection of the cyst under MAC. Today the cyst is c/d/i. No drainage from the cyst or the previous I&D sites.

## 2013-08-29 ENCOUNTER — Telehealth (INDEPENDENT_AMBULATORY_CARE_PROVIDER_SITE_OTHER): Payer: Self-pay

## 2013-08-29 ENCOUNTER — Other Ambulatory Visit: Payer: Self-pay | Admitting: Internal Medicine

## 2013-08-29 NOTE — Progress Notes (Signed)
Case discussed with Dr. Glenn soon after the resident saw the patient.  We reviewed the resident's history and exam and pertinent patient test results.  I agree with the assessment, diagnosis, and plan of care documented in the resident's note. 

## 2013-08-29 NOTE — Telephone Encounter (Signed)
The daughter calling in to see about the pt's visit this week and how soon we need to schedule the pt for her surgery. The pt's daughter wanted [place] to be aware of the pt's situation at home is not very good at the moment b/c of a rodent problem. The daughter has professional's working on trying to get the rodents out of the home but they had to call contractor's in too b/c of standing water under the home. The daughter did confirm that she has no time right now to take off from work but if this was something we had to do right away then she would do whatever she needed to do to get this done for the pt. I advised her that this is no rush that this is an elective case and the pt has to get cardiac clearance along with orders on stopping the plavix. I advised the daughter that Dr Dwain Sarna told the pt we could just leave this a lone if she wanted to and just watch to see if anything would change with the area. The pt was the one really wanting to get surgery just in case if this area should get infected. I told the daughter that the pt's home really needs to be fixed first before we do surgery just in case if Dr Dwain Sarna has to leave this area opened and the pt could need packing done to the wound in some cases. The daughter understands and will talk to the pt. I advised pt that I would still work on the cardiac clearance for the pt so that would be done.

## 2013-09-01 NOTE — Telephone Encounter (Signed)
This certainly can wait until whenever she is ready.

## 2013-09-01 NOTE — Telephone Encounter (Signed)
rx called in

## 2013-09-18 ENCOUNTER — Ambulatory Visit: Payer: Self-pay | Admitting: Internal Medicine

## 2013-09-18 ENCOUNTER — Ambulatory Visit (INDEPENDENT_AMBULATORY_CARE_PROVIDER_SITE_OTHER): Payer: Medicare Other | Admitting: Internal Medicine

## 2013-09-18 ENCOUNTER — Encounter: Payer: Self-pay | Admitting: Internal Medicine

## 2013-09-18 VITALS — BP 141/74 | HR 74 | Temp 97.6°F | Ht 63.0 in | Wt 191.4 lb

## 2013-09-18 DIAGNOSIS — I10 Essential (primary) hypertension: Secondary | ICD-10-CM

## 2013-09-18 DIAGNOSIS — N951 Menopausal and female climacteric states: Secondary | ICD-10-CM

## 2013-09-18 DIAGNOSIS — I5032 Chronic diastolic (congestive) heart failure: Secondary | ICD-10-CM

## 2013-09-18 MED ORDER — GABAPENTIN 300 MG PO CAPS: 300.0000 mg | ORAL_CAPSULE | Freq: Three times a day (TID) | ORAL | Status: DC

## 2013-09-18 MED ORDER — AMLODIPINE BESYLATE 5 MG PO TABS: 10.0000 mg | ORAL_TABLET | Freq: Every day | ORAL | Status: DC

## 2013-09-18 NOTE — Assessment & Plan Note (Signed)
Patient with mild DOE. Her main symptom is BLE edema for which she uses TED hose with much success. Patient lost her TED hose and requested new ones today. We measured her for compression stockings, which will be mailed to her. She should continue to take lasix 20mg  daily. We also increased the amlodipine from 5mg  to 10mg  today in order to decrease filling pressure in hopes of improving diastolic function.

## 2013-09-18 NOTE — Patient Instructions (Addendum)
Thank you for your visit.  Today we fitted you for compression stockings again for your leg swelling.  I prescribed gabapentin, which is a medication that can help with your hot flashes. Take 1 pill (300mg ) daily at bedtime for 1 week. Then take 1 pill twice per day for 1 week. Then start taking 1 pill three times per day after that. This medication can make you drowsy, so please do not drive or operate heavy machinery while taking it. You can continue to take your estrogen cream as well.  You blood pressure is slightly elevated today. I increased the dose of your blood pressure medication called amlodipine. We increased this dose from 5mg  daily to 10 mg (2 pills) daily.   Please follow up with me in three months or as needed.

## 2013-09-18 NOTE — Assessment & Plan Note (Addendum)
Patient has hot flashes 2/2 postmenopausal state w/ complete hysterectomy 2/2 cervical and ovarian cancer back in 1970s. She has used estrogen vaginal cream for years, but is starting to have difficulty applying this 2/2 bilateral hip pain. She requested trying something else, so I prescribed gabapentin for her to try. I asked her to start taking this medication slowly as it can cause drowsiness. I asked her to take 1 pill (300mg ) daily at bedtime for 1 week, then take 1 pill twice per day for 1 week, then start taking 1 pill three times per day after that. We discussed the possibility of using an SSRI for her symptoms, however these medications can interact with Tramadol, which patient is not willing to stop 2/2 chronic joint pain. Therefore, we decided to use gabapentin instead.

## 2013-09-18 NOTE — Assessment & Plan Note (Signed)
Patient's BP is 141/74. Given she has documentation of grade 2 diastolic dysfunction, I think it would be prudent to attempt to decrease blood pressure further to decrease filling pressures. Therefore, I increased amlodipine from 5mg  daily to 10mg  daily. I will see her back in 3 months.

## 2013-09-18 NOTE — Progress Notes (Signed)
Patient ID: [name], female   DOB: 11/16/1940, 72 y.o.   MRN: [id number] HPI [name] is a 72 y.o. F w/ PMH CHF, COPD, gout, HLD, HTN, dCHF (grade 2), depression and a sebaceous cyst presents to the clinic for an acute visit.  Sebaceous cyst- Patient has had sebaceous cyst present on her central chest wall for months. It has been I&D'd twice in the past with some success, though it remains problematic for patient. There is some tenderness to the area, though the drainage has stopped. She was seen by general surgery, Dr. Dwain Sarna, on 10/28 who ideally wants to remove the cyst surgically. However, prior to this patient reports that Dr. Dwain Sarna wanted her to receive evaluation by ophthalmology (which was done on 11/18) as well as psychiatry (scheduled for November 06, 2013). Patient puts antibiotic ointment on the lesion at home and keeps it covered with gauze. It does not bother her much anymore.  HTN- BP today is 141/74, up from last visit. She continues to take amlodipine 5mg  daily, cozaar 100mg  daily, lasix 20mg  daily.   dCHF- Patient had echo done in 2011, which showed grade 2 diastolic dysfunction. She has some DOE, denies PND. Her main symptom is BLE edema for which she takes lasix 20mg  daily and wears TED hose daily. She recently lost her TED hose and would like another pair.   Hot flashes- Patient has hx of hot flashes (s/p total hysterectomy) and has been using premarin vaginal cream, which relieves most of her symptoms. However, she has been having difficulty applying the cream 2/2 bilateral hip pain. She is asking if there is anything else she can take to assist with her hot flashes.   ROS: General: no fevers, chills, changes in weight, changes in appetite Skin: no rash HEENT: no blurry vision, hearing changes, sore throat Pulm: no dyspnea, coughing, wheezing CV: +mild chronic SOB, no chest pain, palpitations Abd: no abdominal pain, nausea/vomiting,  diarrhea/constipation GU: no dysuria, hematuria, polyuria Ext: chronic hip and knee pain, no changes recently Neuro: no weakness, numbness, or tingling  Filed Vitals:   09/18/13 1432  BP: 141/74  Pulse: 74  Temp: 97.6 F (36.4 C)   Physical Exam: General: alert, cooperative, and in no apparent distress HEENT: pupils equal round and reactive to light, vision grossly intact, oropharynx clear and non-erythematous  Neck: supple, no LAD Lungs: clear to ascultation bilaterally, normal work of respiration, no wheezes, rales, ronchi Heart: regular rate and rhythm, no murmurs, gallops, or rubs Abdomen: soft, non-tender, non-distended, normal bowel sounds Extremities:warm extremities bilaterally with good perfusion; trace-1+ pitting edema to mid shin bilateral lower extremities Neurologic: alert & oriented X3, cranial nerves II-XII grossly intact, strength grossly intact, sensation intact to light touch  Current Outpatient Prescriptions on File Prior to Visit  Medication Sig Dispense Refill  . ADVAIR DISKUS 250-50 MCG/DOSE AEPB TAKE 1 INHALATION BY MOUTH TWICE DAILY  60 each  2  . albuterol (PROVENTIL HFA;VENTOLIN HFA) 108 (90 BASE) MCG/ACT inhaler Inhale 2 puffs into the lungs every 6 (six) hours as needed for wheezing.  1 Inhaler  2  . allopurinol (ZYLOPRIM) 300 MG tablet Take 1 tablet (300 mg total) by mouth daily.  30 tablet  6  . amLODipine (NORVASC) 5 MG tablet Take 5 mg by mouth daily.      [name] atorvastatin (LIPITOR) 80 MG tablet Take 1 tablet (80 mg total) by mouth daily.  30 tablet  6  . benzonatate (TESSALON) 100 MG capsule Take  100 mg by mouth 2 (two) times daily as needed for cough.      . Biotin (BIOTIN 5000) 5 MG CAPS Take 1 capsule by mouth daily.        [name] buPROPion (WELLBUTRIN SR) 150 MG 12 hr tablet Take 1 tablet (150 mg total) by mouth 2 (two) times daily.  30 tablet  5  . Calcium Carbonate-Vitamin D (CALCIUM-VITAMIN D) 500-200 MG-UNIT per tablet Take 1 tablet by mouth daily.         . clopidogrel (PLAVIX) 75 MG tablet Take 1 tablet (75 mg total) by mouth daily.  90 tablet  3  . conjugated estrogens (PREMARIN) vaginal cream Place vaginally at bedtime.  90 g  12  . diphenhydrAMINE (SOMINEX) 25 MG tablet Take 25 mg by mouth at bedtime as needed for sleep.      . fluocinonide cream (LIDEX) 0.05 % Apply 1 application topically 2 (two) times daily.  60 g  2  . fluticasone (FLONASE) 50 MCG/ACT nasal spray Place 2 sprays into the nose daily.  16 g  6  . furosemide (LASIX) 20 MG tablet Take 1 tablet (20 mg total) by mouth daily. Okay to use one more tablet daily for increased swelling as needed.  45 tablet  3  . losartan (COZAAR) 100 MG tablet Take 100 mg by mouth daily.      [name] nystatin (MYCOSTATIN) powder Apply 1 g topically 2 (two) times daily. Apply to affected area twice daily      . oxybutynin (DITROPAN-XL) 5 MG 24 hr tablet Take 1 tablet (5 mg total) by mouth at bedtime.  90 tablet  1  . pantoprazole (PROTONIX) 40 MG tablet Take 1 tablet (40 mg total) by mouth daily.  30 tablet  6  . polyethylene glycol (MIRALAX / GLYCOLAX) packet Take 17 g by mouth daily as needed (constipation).      . potassium chloride (KLOR-CON 10) 10 MEQ tablet Take 1 tablet (10 mEq total) by mouth daily.  90 tablet  1  . traMADol (ULTRAM) 50 MG tablet TAKE 1 TABLET BY MOUTH TWICE DAILY  60 tablet  1  . [DISCONTINUED] oxybutynin (DITROPAN-XL) 5 MG 24 hr tablet Take 1 tablet (5 mg total) by mouth at bedtime.  30 tablet  6   No current facility-administered medications on file prior to visit.    Assessment/Plan

## 2013-09-19 ENCOUNTER — Telehealth: Payer: Self-pay | Admitting: Internal Medicine

## 2013-09-19 NOTE — Telephone Encounter (Signed)
  INTERNAL MEDICINE RESIDENCY PROGRAM After-Hours Telephone Call    Reason for call:  I placed an outgoing call to [name] at 0730 pm. Patient states that she would like to clarify the medications that were prescribed to her on 09/18/13. She would like to make sure that she needs to Amlodipine 5 mg two tabs daily and Gabapentin 1 pill (300mg ) daily at bedtime for 1 week. Then take 1 pill twice per day for 1 week. Then start taking 1 pill three times per day after that.     Assessment/ Plan:   I went over the AVS instruction from the OV on 09/18/13 and confirmed with patient about her medication dosage.   As always, pt is advised that if symptoms worsen or new symptoms arise, they should go to an urgent care facility or to to ER for further evaluation.    Dede Query, MD    09/19/2013, 7:58 PM

## 2013-09-20 NOTE — Progress Notes (Signed)
I saw and evaluated the patient.  I personally confirmed the key portions of Dr. Chikowski's history and exam and reviewed pertinent patient test results.  The assessment, diagnosis, and plan were formulated together and I agree with the documentation in the resident's note. 

## 2013-10-01 ENCOUNTER — Other Ambulatory Visit: Payer: Self-pay | Admitting: Internal Medicine

## 2013-10-20 ENCOUNTER — Telehealth: Payer: Self-pay | Admitting: *Deleted

## 2013-10-20 NOTE — Telephone Encounter (Signed)
Pt called - states walker is broken. [place]wood Dense states to call place were walker was purchased. Pt states at [facility] about 3 years ago. Phone number given to pt to call. Stanton Kidney Aubri Gathright RN 10/20/13 12:15PM

## 2013-10-31 ENCOUNTER — Other Ambulatory Visit: Payer: Self-pay | Admitting: Internal Medicine

## 2013-11-06 ENCOUNTER — Ambulatory Visit (INDEPENDENT_AMBULATORY_CARE_PROVIDER_SITE_OTHER): Payer: Medicare Other | Admitting: Psychiatry

## 2013-11-06 ENCOUNTER — Encounter (HOSPITAL_COMMUNITY): Payer: Self-pay | Admitting: Psychiatry

## 2013-11-06 VITALS — BP 158/74 | HR 80 | Ht 63.0 in | Wt 199.4 lb

## 2013-11-06 DIAGNOSIS — F200 Paranoid schizophrenia: Secondary | ICD-10-CM

## 2013-11-06 DIAGNOSIS — F29 Unspecified psychosis not due to a substance or known physiological condition: Secondary | ICD-10-CM

## 2013-11-06 MED ORDER — HALOPERIDOL 1 MG PO TABS: 1.0000 mg | ORAL_TABLET | Freq: Every day | ORAL | Status: DC

## 2013-11-06 NOTE — Progress Notes (Signed)
[facility] Progress Note  [name] [id number] 73 y.o.  11/06/2013 2:14 PM  Chief Complaint: I'm feeling sad depressed and I cannot sleep.  History of Present Illness: Patient is 73 year old divorced African American female who is known to this Probation officer from the past.  She was last seen in May 2013 and given Haldol for psychosis and paranoia.  Later patient called and left a message that she is doing much better and she does not want to take any antipsychotic medication and like to followup with her primary care physician if she needed any medication.  Patient today again with her daughter and endorse increased depression, irritability and poor sleep.  Patient remains very labile and emotional.  Most of the information was obtained from her daughter who is currently living with her.  Apparently patient's son committed suicide last year in June by inhaling carbon monoxide.  The patient was very attached with a son and after that she is decompensating into paranoia, hallucination and depression.  In July she called the police believe that that is Rodent in the house.  She was also seen talking to herself, with these people in the house but patient did not start her Haldol.  At that time she was living by herself.  Her daughter recently moved in in December.  She has noticed patient is more depressed, more paranoid in some time talking to herself.  She is also very concerned because patient continues to believe that there are animals and Rodent in the house.  Daughter endorsed that there were some damage in the basement and there was a standing order and the crawl space however it has been cleaned.  Daughter endorsed that place is in her living condition and there has been no hazardous condition.  However the patient continues to have trust issue and feet sometimes very paranoid.  Patient also not seen any grief counselor since the loss of her son.  She's been really sick recently  and she was given antibiotic and pain medication.  She is scheduled to have surgery but patient does not provide details.  Patient was recently given gabapentin by family practice physician and patient is not happy because she believes it is making her more paranoid.  Daughter endorsed since patient is taking gabapentin she's been hallucinating more.  Patient told that recently she is seeing residents in her regular physician is not available.  She is not happy because there has been a lot of changes in her medication.  The patient like to restart her Haldol which helped her in the past.  She remembers sleeping good the Haldol.  She admitted some time crying spells, anhedonia, lack of motivation and desire to do many things.  She continues to take Wellbutrin is prescribed by her primary care physician.  Patient denies any aggression or violence.  Her daughter is staying with her because patient requires assistance.  She denies any suicidal thoughts or homicidal thoughts.  She has no tremors or shakes.  She is not drinking or using any illegal substances.  Suicidal Ideation: No Plan Formed: No Patient has means to carry out plan: No  Homicidal Ideation: No Plan Formed: No Patient has means to carry out plan: No  Review of Systems: Psychiatric: Agitation: Irritability Hallucination: Yes Depressed Mood: Yes Insomnia: Yes Hypersomnia: No Altered Concentration: No Feels Worthless: No Grandiose Ideas: No Belief In Special Powers: Yes New/Increased Substance Abuse: No Compulsions: No  Neurologic: Headache: No Seizure: No Paresthesias: No  Past Medical Family, Social History:  Patient was born and raised in [place]. She had 2 sons and one daughter.  Both her sons are in jail.  Her daughter recently moved in to live with her.  Patient had another son who committed suicide in 2012-04-11 .  Patient husband died many years ago.  Patient has 3 years of college and she has worked as a  Doctor, hospital .  Patient endorsed grandfather and brother has significant psychiatric illness.  His brother died in a mental health hospital in [place].  Patient has multiple health problems.  She has GERD, gout, hypertension, degenerative joint disease, osteoarthritis, TIA and bilateral hearing loss.  She has history of mitral valve prolapse, chronic venous insufficiency, diverticulitis hyperlipidemia obesity and seasonal allergies.  Patient is scheduled to have surgery for her sebaceous cyst .  [facility] needs.    Outpatient Encounter Prescriptions as of 11/06/2013  Medication Sig  . ADVAIR DISKUS 250-50 MCG/DOSE AEPB TAKE 1 INHALATION BY MOUTH TWICE DAILY  . allopurinol (ZYLOPRIM) 300 MG tablet TAKE 1 TABLET BY MOUTH DAILY  . amLODipine (NORVASC) 5 MG tablet Take 2 tablets (10 mg total) by mouth daily.  [name] atorvastatin (LIPITOR) 80 MG tablet TAKE 1 TABLET BY MOUTH EVERY DAY  . benzonatate (TESSALON) 100 MG capsule Take 100 mg by mouth 2 (two) times daily as needed for cough.  . Biotin (BIOTIN 5000) 5 MG CAPS Take 1 capsule by mouth daily.    [name] buPROPion (WELLBUTRIN SR) 150 MG 12 hr tablet Take 1 tablet (150 mg total) by mouth 2 (two) times daily.  . Calcium Carbonate-Vitamin D (CALCIUM-VITAMIN D) 500-200 MG-UNIT per tablet Take 1 tablet by mouth daily.    . clopidogrel (PLAVIX) 75 MG tablet Take 1 tablet (75 mg total) by mouth daily.  [name] conjugated estrogens (PREMARIN) vaginal cream Place vaginally at bedtime.  . diphenhydrAMINE (SOMINEX) 25 MG tablet Take 25 mg by mouth at bedtime as needed for sleep.  . fluocinonide cream (LIDEX) 7.42 % Apply 1 application topically 2 (two) times daily.  . fluticasone (FLONASE) 50 MCG/ACT nasal spray INHALE 2 SPRAYS INTRANASALLY ONCE DAILY  . furosemide (LASIX) 20 MG tablet TAKE 1 TABLET (20 MG TOTAL) BY MOUTH DAILY. OKAY TO USE ONE MORE TABLET DAILY FOR INCREASED SWELLING AS NEEDED.  [name] losartan (COZAAR) 100 MG tablet  Take 100 mg by mouth daily.  [name] nystatin (MYCOSTATIN) powder Apply 1 g topically 2 (two) times daily. Apply to affected area twice daily  . nystatin (MYCOSTATIN) powder APPLY TO AFFECTED AREA TWICE DAILY  . oxybutynin (DITROPAN-XL) 5 MG 24 hr tablet Take 1 tablet (5 mg total) by mouth at bedtime.  . pantoprazole (PROTONIX) 40 MG tablet Take 1 tablet (40 mg total) by mouth daily.  . polyethylene glycol (MIRALAX / GLYCOLAX) packet Take 17 g by mouth daily as needed (constipation).  . potassium chloride (KLOR-CON 10) 10 MEQ tablet Take 1 tablet (10 mEq total) by mouth daily.  [name] PROAIR HFA 108 (90 BASE) MCG/ACT inhaler INHALE 2 PUFFS INTO THE LUNGS EVERY 6 (SIX) HOURS AS NEEDED FOR WHEEZING.  . traMADol (ULTRAM) 50 MG tablet TAKE 1 TABLET BY MOUTH TWICE DAILY  . [DISCONTINUED] gabapentin (NEURONTIN) 300 MG capsule Take 1 capsule (300 mg total) by mouth 3 (three) times daily.  . haloperidol (HALDOL) 1 MG tablet Take 1 tablet (1 mg total) by mouth at bedtime.    Past Psychiatric History/Hospitalization(s): Patient  admitted history of one psychiatric hospitalization many years ago. Patient remember she was trying to jump off a window when her husband saved her and put and at [facility] for 3 weeks.  She was seen by this writer in 2013 and given Haldol which helped her but patient stopped taking the medication because she was feeling better.   Anxiety: Yes Bipolar Disorder: No Depression: Yes Mania: No Psychosis: Yes Schizophrenia: Yes Personality Disorder: No Hospitalization for psychiatric illness: Yes History of Electroconvulsive Shock Therapy: No Prior Suicide Attempts: Patient tried to jump off from the window but her husband rescued her.  Physical Exam: Constitutional:  BP 158/74  Pulse 80  Ht 5\' 3"  (1.6 m)  Wt 199 lb 6.4 oz (90.447 kg)  BMI 35.33 kg/m2  General Appearance: obese and Casually dressed.  Musculoskeletal: Strength & Muscle Tone: Patient uses walker for ambulation.   She has chronic pain. Gait & Station: unsteady Patient leans: Front and Backward  Psychiatric: Speech (describe rate, volume, coherence, spontaneity, and abnormalities if any):  fast and rambling.  At times pressured with increased tone.  Thought Process (describe rate, content, abstract reasoning, and computation):  circumstantial and difficult to organize her thinking.  Sometimes illogical.  Associations: Irrelevant, Tangential, Disorganized and Loose  Thoughts: delusions, hallucinations and Preoccupied with the animals and rodents  Mental Status: Orientation: oriented to person, place and time/date Mood & Affect: labile affect Attention Span & Concentration: poor  Medical Decision Making (Choose Three): Review of Psycho-Social Stressors (1), Review or order clinical lab tests (1), Decision to obtain old records (1), Review and summation of old records (2), Established Problem, Worsening (2), Review of Last Therapy Session (1), Review of Medication Regimen & Side Effects (2) and Review of New Medication or Change in Dosage (2)  Assessment: Axis I:  psychosis NOS, rule out paranoid schizophrenia  Axis II: deferred  Axis III:  see medical history  Axis IV: moderate  Axis V:  V. 50-55   Plan:  I review her history, symptoms, collateral information and her current medication.  Patient is slowly decompensating into her illness.  Her daughter endorsed that symptoms get worse since she is taking gabapentin.  Recommended to stop gabapentin.  Recommended to restart Haldol 1 mg at bedtime.  In the past she has given 2 mg the patient complained that dose is too strong and she was reduced to 1 mg.  The patient does not experience any side effects with 1 mg Haldol.  We will consider adding Cogentin and patient experiencing any muscle stiffness or tremors.  I also believe that she should see counselor for grief counseling and coping skills.  We will scheduled appointment with Eloise Levels in  this office.  Discuss in detail risks and benefits of medication.  Recommend to call us back if she has any question or any concern.  Followup in 3 weeks.  Time spent 25 minutes.  More than 50% of the time spent in psychoeducation, counseling and coordination of care.  Discuss safety plan that anytime having active suicidal thoughts or homicidal thoughts then patient need to call 911 or go to the local emergency room.    ARFEEN,SYED T., MD 11/06/2013

## 2013-11-14 ENCOUNTER — Telehealth: Payer: Self-pay | Admitting: Internal Medicine

## 2013-11-14 NOTE — Telephone Encounter (Signed)
I attempted to return [name]'s call. I believe her request to be referred to general surgery again has to do with her sebaceous cyst, however I cannot be sure. I will not place this consult until I either speak to [name] or she returns to see a physician in our clinic.

## 2013-11-17 ENCOUNTER — Telehealth: Payer: Self-pay | Admitting: Internal Medicine

## 2013-11-17 NOTE — Telephone Encounter (Signed)
Dr. Lorin Mercy with h/o hip surgery years ago.  Last 3 days b/l femurs hurting and she is crying.  Bottom of b/l feet feel like vibrating.  Upper arms like stabbing sensation.  For pain control Dr. Lorin Mercy has given her Tramadol bid prn.  Last night she took 2 pills at once and made her drowsy.  Advised pt to come to ED or our [facility] clinic is open on Tuesday.  She may come to the ED   Aundra Dubin [phone]

## 2013-11-24 ENCOUNTER — Telehealth (INDEPENDENT_AMBULATORY_CARE_PROVIDER_SITE_OTHER): Payer: Self-pay

## 2013-11-24 NOTE — Telephone Encounter (Signed)
The pt requested for this [facility] nurse to call and notify us that when she has surgery by Dr Donne Hazel that she is requesting to stay overnight in the hospital. The pt does not live in good conditions at home and she is very scared to come home after surgery. [place] drug pushing [place] with prostitution all over. The pt is really estranged from the daughter that lives with her in the home to where she thinks the daughter would not take care of her at all after surgery. I advised this nurse that I really couldn't do anything or discuss pt's care until the pt put her on the hippa list. The pt comes in for an appt with Dr Donne Hazel on 1/30 and the pt needs to add her to the list so we can be in communication with her. This nurse has taken care of the pt for 3 years now just helping with all of health issues.

## 2013-11-26 ENCOUNTER — Ambulatory Visit (HOSPITAL_COMMUNITY): Payer: Self-pay | Admitting: Psychiatry

## 2013-11-27 ENCOUNTER — Encounter (INDEPENDENT_AMBULATORY_CARE_PROVIDER_SITE_OTHER): Payer: Medicare Other | Admitting: General Surgery

## 2013-11-27 ENCOUNTER — Encounter (HOSPITAL_COMMUNITY): Payer: Self-pay | Admitting: Psychiatry

## 2013-11-27 ENCOUNTER — Ambulatory Visit (INDEPENDENT_AMBULATORY_CARE_PROVIDER_SITE_OTHER): Payer: Medicare Other | Admitting: Psychiatry

## 2013-11-27 VITALS — BP 126/69 | HR 71 | Ht 63.0 in | Wt 192.6 lb

## 2013-11-27 DIAGNOSIS — F200 Paranoid schizophrenia: Secondary | ICD-10-CM

## 2013-11-27 MED ORDER — HALOPERIDOL 1 MG PO TABS: 1.0000 mg | ORAL_TABLET | Freq: Every day | ORAL | Status: DC

## 2013-11-27 NOTE — Progress Notes (Signed)
[facility] Progress Note  [name] [id number] 72 y.o.  11/27/2013 2:46 PM  Chief Complaint: I have too much going on.  My daughter is making me crazy.  But I like the medication.    History of Present Illness: [name] came for her followup appointment.  She was seen last time on January 8 .  We started her on Haldol 1 mg at bedtime.  She is feeling less paranoid and less irritable but remains very emotional and labile.  She is sleeping 6-7 hours.  Patient told she's been busy and trying to get in touch with doctors for her surgery.  Patient like Haldol.  She denies any tremors or shakes.  She denies any EPS.  She endorsed less paranoia, hallucination and irritability.  Her daughter moved out .  Patient told she's having issues with her daughter because she is driving her crazy.  Patient is also taking Wellbutrin as given by her primary care physician.  She still feels sometimes paranoid and believed people in the house however she understand that she has a psychiatric illness.  Patient is very resistant and reluctant to increase the dose of medication.  In the past she had tried to milligram that she felt very dizzy and groggy.  Patient continues to have trust issue .  Patient is scheduled to see therapist in this office on February 12 .  Patient denies any suicidal thoughts or homicidal thoughts.  Patient denies any aggression or violence.  Patient is not drinking or using any illegal substances.  Her appetite is unchanged from the past.  Her vitals are stable.  She does not leave her house unless needed.  Suicidal Ideation: No Plan Formed: No Patient has means to carry out plan: No  Homicidal Ideation: No Plan Formed: No Patient has means to carry out plan: No  Review of Systems: Psychiatric: Agitation: Irritability Hallucination: Yes Depressed Mood: No Insomnia: No Hypersomnia: No Altered Concentration: No Feels Worthless: No Grandiose Ideas: No Belief In  Special Powers: No New/Increased Substance Abuse: No Compulsions: No  Neurologic: Headache: No Seizure: No Paresthesias: No  Past Medical Family, Social History:  Patient was born and raised in [place]. She had 2 sons and one daughter.  Both her sons are in jail. Patient had another son who committed suicide in 04-27-2012 .  Patient husband died many years ago.  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  His brother died in a mental health hospital in [place].  Patient has multiple health problems.  She has GERD, gout, hypertension, degenerative joint disease, osteoarthritis, TIA and bilateral hearing loss.  She has history of mitral valve prolapse, chronic venous insufficiency, diverticulitis hyperlipidemia obesity and seasonal allergies. [facility] needs.    Outpatient Encounter Prescriptions as of 11/27/2013  Medication Sig  . haloperidol (HALDOL) 1 MG tablet Take 1 tablet (1 mg total) by mouth at bedtime.  . [DISCONTINUED] haloperidol (HALDOL) 1 MG tablet Take 1 tablet (1 mg total) by mouth at bedtime.  [name] ADVAIR DISKUS 250-50 MCG/DOSE AEPB TAKE 1 INHALATION BY MOUTH TWICE DAILY  . allopurinol (ZYLOPRIM) 300 MG tablet TAKE 1 TABLET BY MOUTH DAILY  . amLODipine (NORVASC) 5 MG tablet Take 2 tablets (10 mg total) by mouth daily.  [name] atorvastatin (LIPITOR) 80 MG tablet TAKE 1 TABLET BY MOUTH EVERY DAY  .  benzonatate (TESSALON) 100 MG capsule Take 100 mg by mouth 2 (two) times daily as needed for cough.  . Biotin (BIOTIN 5000) 5 MG CAPS Take 1 capsule by mouth daily.    [name] buPROPion (WELLBUTRIN SR) 150 MG 12 hr tablet Take 1 tablet (150 mg total) by mouth 2 (two) times daily.  . Calcium Carbonate-Vitamin D (CALCIUM-VITAMIN D) 500-200 MG-UNIT per tablet Take 1 tablet by mouth daily.    . clopidogrel (PLAVIX) 75 MG tablet Take 1  tablet (75 mg total) by mouth daily.  [name] conjugated estrogens (PREMARIN) vaginal cream Place vaginally at bedtime.  . diphenhydrAMINE (SOMINEX) 25 MG tablet Take 25 mg by mouth at bedtime as needed for sleep.  . fluocinonide cream (LIDEX) 4.27 % Apply 1 application topically 2 (two) times daily.  . fluticasone (FLONASE) 50 MCG/ACT nasal spray INHALE 2 SPRAYS INTRANASALLY ONCE DAILY  . furosemide (LASIX) 20 MG tablet TAKE 1 TABLET (20 MG TOTAL) BY MOUTH DAILY. OKAY TO USE ONE MORE TABLET DAILY FOR INCREASED SWELLING AS NEEDED.  [name] losartan (COZAAR) 100 MG tablet Take 100 mg by mouth daily.  [name] nystatin (MYCOSTATIN) powder Apply 1 g topically 2 (two) times daily. Apply to affected area twice daily  . nystatin (MYCOSTATIN) powder APPLY TO AFFECTED AREA TWICE DAILY  . oxybutynin (DITROPAN-XL) 5 MG 24 hr tablet Take 1 tablet (5 mg total) by mouth at bedtime.  . pantoprazole (PROTONIX) 40 MG tablet Take 1 tablet (40 mg total) by mouth daily.  . polyethylene glycol (MIRALAX / GLYCOLAX) packet Take 17 g by mouth daily as needed (constipation).  . potassium chloride (KLOR-CON 10) 10 MEQ tablet Take 1 tablet (10 mEq total) by mouth daily.  [name] PROAIR HFA 108 (90 BASE) MCG/ACT inhaler INHALE 2 PUFFS INTO THE LUNGS EVERY 6 (SIX) HOURS AS NEEDED FOR WHEEZING.  . traMADol (ULTRAM) 50 MG tablet TAKE 1 TABLET BY MOUTH TWICE DAILY    Past Psychiatric History/Hospitalization(s): Patient admitted history of one psychiatric hospitalization many years ago. Patient remember she was trying to jump off a window when her husband saved her. She was admitted at [facility] for 3 weeks.  She was seen by this writer in 2013 and given Haldol which helped her but patient stopped taking the medication because she was feeling better.   Anxiety: Yes Bipolar Disorder: No Depression: Yes Mania: No Psychosis: Yes Schizophrenia: Yes Personality Disorder: No Hospitalization for psychiatric illness: Yes History of Electroconvulsive  Shock Therapy: No Prior Suicide Attempts: Patient tried to jump off from the window but her husband rescued her.  Physical Exam: Constitutional:  BP 126/69  Pulse 71  Ht 5\' 3"  (1.6 m)  Wt 192 lb 9.6 oz (87.363 kg)  BMI 34.13 kg/m2  General Appearance: obese and Casually dressed.  She appears older than her stated age.  She has difficulty walking.  Musculoskeletal: Strength & Muscle Tone: Patient uses walker for ambulation.  She has chronic pain. Gait & Station: unsteady Patient leans: Front and Backward  Psychiatric: Speech (describe rate, volume, coherence, spontaneity, and abnormalities if any):  Fast, rambling with increased tone and volume.    Thought Process (describe rate, content, abstract reasoning, and computation):  circumstantial and difficult to organize her thinking.  Sometimes illogical.  Associations: Irrelevant, Tangential, Disorganized and Loose  Thoughts: hallucinations and Paranoid beliefs sometimes animals in the house.  Mental Status: Orientation: oriented to person, place and time/date Mood & Affect: labile affect Attention Span & Concentration: poor  Established Problem, Stable/Improving (1), New  problem, with additional work up planned, Review of Psycho-Social Stressors (1), Review of Last Therapy Session (1), Review of Medication Regimen & Side Effects (2) and Review of New Medication or Change in Dosage (2)  Assessment: Axis I:  psychosis NOS, rule out paranoid schizophrenia, rule out major depressive disorder with psychosis  Axis II: deferred  Axis III:  see medical history  Axis IV: moderate  Axis V:  V. 50-55   Plan:   Psychosis: Patient is tolerating Haldol 1 mg at bedtime.  She does not have any tremors, shakes or any dizziness.  She is working to increase the dosage of medication.  She still has a residual psychosis and paranoia however she is comfortable with the current dose.   Depression Patient will continue Wellbutrin SR 150 mg  twice a day which is given by her primary care physician.  She still has refills remaining.  Her crying spells are less intense from the past.  Patient scheduled to see a therapist for coping and social skills.  Patient is also concerned about her chronic health issues.  I reviewed her chart, patient has not scheduled to have her surgery.  She is taking multiple medications for chronic pain.  I explained about polypharmacy and drug drug interactions.  Recommend closely followup with her primary care physician.  I will see her again in 3 months.  Recommend to call us back if she has any question or any concern. Time spent 25 minutes.  More than 50% of the time spent in psychoeducation, counseling and coordination of care.  Discuss safety plan that anytime having active suicidal thoughts or homicidal thoughts then patient need to call 911 or go to the local emergency room.  ARFEEN,SYED T., MD 11/27/2013

## 2013-11-28 ENCOUNTER — Encounter (INDEPENDENT_AMBULATORY_CARE_PROVIDER_SITE_OTHER): Payer: Medicare Other | Admitting: General Surgery

## 2013-12-01 ENCOUNTER — Other Ambulatory Visit: Payer: Self-pay | Admitting: Internal Medicine

## 2013-12-01 NOTE — Telephone Encounter (Signed)
Tramadol rx called to [facility]. 

## 2013-12-02 ENCOUNTER — Other Ambulatory Visit: Payer: Self-pay | Admitting: Internal Medicine

## 2013-12-11 ENCOUNTER — Ambulatory Visit (INDEPENDENT_AMBULATORY_CARE_PROVIDER_SITE_OTHER): Payer: Medicare Other | Admitting: Psychiatry

## 2013-12-11 DIAGNOSIS — F200 Paranoid schizophrenia: Secondary | ICD-10-CM

## 2013-12-11 DIAGNOSIS — F29 Unspecified psychosis not due to a substance or known physiological condition: Secondary | ICD-10-CM

## 2013-12-12 NOTE — Progress Notes (Signed)
Patient ID: [name], female   DOB: [birth year], 73 y.o.   MRN: [id number] Presenting Problem Chief Complaint: psychosis  What are the main stressors in your life right now, how long? Grief and loss related to son's suicide last year  Previous mental health services Have you ever been treated for a mental health problem, when, where, by whom? Yes    Are you currently seeing a therapist or counselor, counselor's name? No   Have you ever had a mental health hospitalization, how many times, length of stay? Yes   Have you ever had suicidal thoughts or attempted suicide, when, how? No   Risk factors for Suicide Demographic factors:  Age 70 or older Current mental status: no suicidal ideation reported Loss factors: Loss of significant relationship Historical factors: none reported Risk Reduction factors: Religious beliefs about death, Living with another person, especially a relative and Positive social support Clinical factors: schizophrenia Cognitive features that contribute to risk: none  SUICIDE RISK:  Minimal: No identifiable suicidal ideation.  Patients presenting with no risk factors but with morbid ruminations; may be classified as minimal risk based on the severity of the depressive symptoms   Social/family history Have you been married, how many times?  one  Do you have children? 4 children; one son committed suicide last year  Who lives in your current household? Lives alone  Military history: No   Religious/spiritual involvement:  What religion/faith base are you? Catholic  Family of origin (childhood history)  Where were you born? [place] Where did you grow up? [place]  Are your parents separated/divorced, when and why? No   Are your parents alive? No   Social supports (personal and professional): daughter, neighbor, church   Employment (financial issues) retired  Scientist, research (physical sciences) history none  Trauma/Abuse history: Have you ever been exposed to any  form of abuse, what type? No   Have you ever been exposed to something traumatic, describe? No   Substance use None reported  Mental Status: General Appearance Brayton Mars:  Casual Eye Contact:  Good Motor Behavior:  Normal Speech:  Normal Level of Consciousness:  Alert Mood:  Euthymic Affect:  Appropriate Anxiety Level:  minimal Thought Process:  Coherent Thought Content:  WNL Perception:  Normal Judgment:  Good Insight:  Present Cognition:  wnl  Diagnosis AXIS I Psychotic Disorder NOS  AXIS II No diagnosis  AXIS III Past Medical History  Diagnosis Date  . History of cervical cancer 1972  . Ovarian cancer 1972    S/P oophorectomy  . GERD (gastroesophageal reflux disease)   . Gout   . Hypertension   . DJD (degenerative joint disease), multiple sites     Low back pain worst  . DDD (degenerative disc disease), lumbosacral   . Osteoarthritis of hip     bilateral hips  . Hx-TIA (transient ischemic attack) 05/13/2001    Right facial numbness  . Sensorineural hearing loss of both ears   . Mitral valve prolapse 10/27/1999    Subsequent 2D echo show normal mitral valve  . Chronic venous insufficiency   . Colon, diverticulosis Sept. 2011    outpatient colonoscopy by Dr. Cristina Gong.  Need record  . Hyperlipidemia   . Recurrent boils     History of MRSA skin infections with abscess  . Gout     Uric Acid level 4.2 on 300 allopurinol  . Depression     AXIS IV other psychosocial or environmental problems  AXIS V 51-60 moderate symptoms   Plan: Pt. To  continue cognitive/reality testing, supportive/strength-based treatment. Pt. To return in 3-4 weeks.  _________________________________________ Eloise Levels, Ph.D., [facility]

## 2013-12-16 ENCOUNTER — Ambulatory Visit (INDEPENDENT_AMBULATORY_CARE_PROVIDER_SITE_OTHER): Payer: Medicare Other | Admitting: General Surgery

## 2013-12-20 ENCOUNTER — Encounter (HOSPITAL_COMMUNITY): Payer: Self-pay | Admitting: Emergency Medicine

## 2013-12-20 DIAGNOSIS — Z8614 Personal history of Methicillin resistant Staphylococcus aureus infection: Secondary | ICD-10-CM | POA: Insufficient documentation

## 2013-12-20 DIAGNOSIS — Z8543 Personal history of malignant neoplasm of ovary: Secondary | ICD-10-CM | POA: Insufficient documentation

## 2013-12-20 DIAGNOSIS — M1610 Unilateral primary osteoarthritis, unspecified hip: Secondary | ICD-10-CM | POA: Insufficient documentation

## 2013-12-20 DIAGNOSIS — M109 Gout, unspecified: Secondary | ICD-10-CM | POA: Insufficient documentation

## 2013-12-20 DIAGNOSIS — Z8659 Personal history of other mental and behavioral disorders: Secondary | ICD-10-CM | POA: Insufficient documentation

## 2013-12-20 DIAGNOSIS — Z7902 Long term (current) use of antithrombotics/antiplatelets: Secondary | ICD-10-CM | POA: Insufficient documentation

## 2013-12-20 DIAGNOSIS — H903 Sensorineural hearing loss, bilateral: Secondary | ICD-10-CM | POA: Insufficient documentation

## 2013-12-20 DIAGNOSIS — I10 Essential (primary) hypertension: Secondary | ICD-10-CM | POA: Insufficient documentation

## 2013-12-20 DIAGNOSIS — Z87891 Personal history of nicotine dependence: Secondary | ICD-10-CM | POA: Insufficient documentation

## 2013-12-20 DIAGNOSIS — Z90710 Acquired absence of both cervix and uterus: Secondary | ICD-10-CM | POA: Insufficient documentation

## 2013-12-20 DIAGNOSIS — M169 Osteoarthritis of hip, unspecified: Secondary | ICD-10-CM | POA: Insufficient documentation

## 2013-12-20 DIAGNOSIS — Z79899 Other long term (current) drug therapy: Secondary | ICD-10-CM | POA: Insufficient documentation

## 2013-12-20 DIAGNOSIS — Z7982 Long term (current) use of aspirin: Secondary | ICD-10-CM | POA: Insufficient documentation

## 2013-12-20 DIAGNOSIS — Z8673 Personal history of transient ischemic attack (TIA), and cerebral infarction without residual deficits: Secondary | ICD-10-CM | POA: Insufficient documentation

## 2013-12-20 DIAGNOSIS — IMO0002 Reserved for concepts with insufficient information to code with codable children: Secondary | ICD-10-CM | POA: Insufficient documentation

## 2013-12-20 DIAGNOSIS — K5900 Constipation, unspecified: Secondary | ICD-10-CM | POA: Insufficient documentation

## 2013-12-20 DIAGNOSIS — K219 Gastro-esophageal reflux disease without esophagitis: Secondary | ICD-10-CM | POA: Insufficient documentation

## 2013-12-20 DIAGNOSIS — Z8541 Personal history of malignant neoplasm of cervix uteri: Secondary | ICD-10-CM | POA: Insufficient documentation

## 2013-12-20 DIAGNOSIS — E785 Hyperlipidemia, unspecified: Secondary | ICD-10-CM | POA: Insufficient documentation

## 2013-12-20 DIAGNOSIS — Z872 Personal history of diseases of the skin and subcutaneous tissue: Secondary | ICD-10-CM | POA: Insufficient documentation

## 2013-12-20 LAB — COMPREHENSIVE METABOLIC PANEL
ALT: 24 U/L (ref 0–35)
AST: 29 U/L (ref 0–37)
Albumin: 3.6 g/dL (ref 3.5–5.2)
Alkaline Phosphatase: 98 U/L (ref 39–117)
BUN: 18 mg/dL (ref 6–23)
CO2: 26 mEq/L (ref 19–32)
Calcium: 9.2 mg/dL (ref 8.4–10.5)
Chloride: 100 mEq/L (ref 96–112)
Creatinine, Ser: 1.2 mg/dL — ABNORMAL HIGH (ref 0.50–1.10)
GFR calc Af Amer: 51 mL/min — ABNORMAL LOW (ref >90)
GFR calc non Af Amer: 44 mL/min — ABNORMAL LOW (ref >90)
Glucose, Bld: 101 mg/dL — ABNORMAL HIGH (ref 70–99)
Potassium: 4.1 mEq/L (ref 3.7–5.3)
Sodium: 138 mEq/L (ref 137–147)
Total Bilirubin: 0.2 mg/dL — ABNORMAL LOW (ref 0.3–1.2)
Total Protein: 7.4 g/dL (ref 6.0–8.3)

## 2013-12-20 LAB — CBC WITH DIFFERENTIAL/PLATELET
Basophils Absolute: 0 10*3/uL (ref 0.0–0.1)
Basophils Relative: 0 % (ref 0–1)
Eosinophils Absolute: 0.4 10*3/uL (ref 0.0–0.7)
Eosinophils Relative: 6 % — ABNORMAL HIGH (ref 0–5)
HCT: 34.2 % — ABNORMAL LOW (ref 36.0–46.0)
Hemoglobin: 11.6 g/dL — ABNORMAL LOW (ref 12.0–15.0)
Lymphocytes Relative: 45 % (ref 12–46)
Lymphs Abs: 3 10*3/uL (ref 0.7–4.0)
MCH: 32.4 pg (ref 26.0–34.0)
MCHC: 33.9 g/dL (ref 30.0–36.0)
MCV: 95.5 fL (ref 78.0–100.0)
Monocytes Absolute: 0.4 10*3/uL (ref 0.1–1.0)
Monocytes Relative: 6 % (ref 3–12)
Neutro Abs: 3 10*3/uL (ref 1.7–7.7)
Neutrophils Relative %: 43 % (ref 43–77)
Platelets: 225 10*3/uL (ref 150–400)
RBC: 3.58 MIL/uL — ABNORMAL LOW (ref 3.87–5.11)
RDW: 15.6 % — ABNORMAL HIGH (ref 11.5–15.5)
WBC: 6.8 10*3/uL (ref 4.0–10.5)

## 2013-12-20 NOTE — ED Notes (Signed)
Unable to locate at this time. Called X1

## 2013-12-20 NOTE — ED Notes (Signed)
Pt. reports constipation with right lateral abdominal pain , last BM 4 days ago , denies nausea or vomitting . No fever or chills.

## 2013-12-20 NOTE — ED Notes (Signed)
Pt given wait time

## 2013-12-20 NOTE — ED Notes (Signed)
Per EMS: Pt report she has not had a BM in 4 days. Pt reports generalized abdominal. Vitals WDL. Ax4, NAD.

## 2013-12-21 ENCOUNTER — Emergency Department (HOSPITAL_COMMUNITY): Payer: Medicare Other

## 2013-12-21 ENCOUNTER — Emergency Department (HOSPITAL_COMMUNITY)
Admission: EM | Admit: 2013-12-21 | Discharge: 2013-12-21 | Disposition: A | Discharge status: Home / Self Care | Payer: Medicare Other | Attending: Emergency Medicine | Admitting: Emergency Medicine

## 2013-12-21 DIAGNOSIS — R109 Unspecified abdominal pain: Secondary | ICD-10-CM

## 2013-12-21 DIAGNOSIS — K5900 Constipation, unspecified: Secondary | ICD-10-CM

## 2013-12-21 LAB — URINE MICROSCOPIC-ADD ON

## 2013-12-21 LAB — URINALYSIS, ROUTINE W REFLEX MICROSCOPIC
Bilirubin Urine: NEGATIVE
Glucose, UA: NEGATIVE mg/dL
Hgb urine dipstick: NEGATIVE
Ketones, ur: NEGATIVE mg/dL
Nitrite: NEGATIVE
Protein, ur: NEGATIVE mg/dL
Specific Gravity, Urine: 1.018 (ref 1.005–1.030)
Urobilinogen, UA: 1 mg/dL (ref 0.0–1.0)
pH: 7 (ref 5.0–8.0)

## 2013-12-21 MED ORDER — FLEET ENEMA 7-19 GM/118ML RE ENEM
1.0000 | ENEMA | Freq: Once | RECTAL | Status: AC
  Administered 2013-12-21: 1 via RECTAL
  Filled 2013-12-21: qty 1

## 2013-12-21 MED ORDER — DOCUSATE SODIUM 100 MG PO CAPS: 100.0000 mg | ORAL_CAPSULE | Freq: Two times a day (BID) | ORAL | Status: DC

## 2013-12-21 NOTE — ED Notes (Signed)
Wait time given vitals rechecked

## 2013-12-21 NOTE — ED Notes (Signed)
Following enema, patient was able to have medium sized bowel movement.

## 2013-12-21 NOTE — ED Provider Notes (Signed)
CSN: [id number]     Arrival date & time 12/20/13  2139 History   First MD Initiated Contact with Patient 12/21/13 0253     Chief Complaint  Patient presents with  . Constipation  . Abdominal Pain     (Consider location/radiation/quality/duration/timing/severity/associated sxs/prior Treatment) HPI Comments: 73 yo female with cervical and ovarian CA hx, gout, hysterectomy, cholecystectomy, appy removal in the past presents with right flank pain, intermittent sharp/ cramping, mild hx of similar in the past. No kidney stones.  No BM for 5 days, no new medicines.  No narcotics.   Patient is a 73 y.o. female presenting with constipation and abdominal pain. The history is provided by the patient.  Constipation Associated symptoms: abdominal pain   Associated symptoms: no back pain, no diarrhea, no dysuria, no fever and no vomiting   Abdominal Pain Associated symptoms: constipation   Associated symptoms: no chest pain, no chills, no diarrhea, no dysuria, no fever, no shortness of breath and no vomiting     Past Medical History  Diagnosis Date  . History of cervical cancer 1972  . Ovarian cancer 1972    S/P oophorectomy  . GERD (gastroesophageal reflux disease)   . Gout   . Hypertension   . DJD (degenerative joint disease), multiple sites     Low back pain worst  . DDD (degenerative disc disease), lumbosacral   . Osteoarthritis of hip     bilateral hips  . Hx-TIA (transient ischemic attack) 05/13/2001    Right facial numbness  . Sensorineural hearing loss of both ears   . Mitral valve prolapse 10/27/1999    Subsequent 2D echo show normal mitral valve  . Chronic venous insufficiency   . Colon, diverticulosis Sept. 2011    outpatient colonoscopy by Dr. Cristina Gong.  Need record  . Hyperlipidemia   . Recurrent boils     History of MRSA skin infections with abscess  . Gout     Uric Acid level 4.2 on 300 allopurinol  . Depression    Past Surgical History  Procedure Laterality Date  .  Joint replacement      bilateral hip replacement  . Abdominal hysterectomy      1972  . Oophorectomy      1972 for ovarian cancer  . Lumbar disc surgery      L5-S1 7/07  . Cervical discectomy  7/07    C5-C6   Family History  Problem Relation Age of Onset  . Diabetes Mother   . Heart disease Mother   . Hearing loss Mother   . Stroke Mother   . Schizophrenia Maternal Grandmother    History  Substance Use Topics  . Smoking status: Former Smoker -- 0.10 packs/day    Types: Cigarettes    Quit date: 04/08/2013  . Smokeless tobacco: Never Used     Comment: pt has started back smoking.  Does 2-4 cigs per day  . Alcohol Use: No   OB History   Grav Para Term Preterm Abortions TAB SAB Ect Mult Living                 Review of Systems  Constitutional: Negative for fever and chills.  HENT: Negative for congestion.   Eyes: Negative for visual disturbance.  Respiratory: Negative for shortness of breath.   Cardiovascular: Negative for chest pain.  Gastrointestinal: Positive for abdominal pain and constipation. Negative for vomiting and diarrhea.  Genitourinary: Positive for flank pain. Negative for dysuria.  Musculoskeletal: Negative for back pain, neck  pain and neck stiffness.  Skin: Negative for rash.  Neurological: Negative for light-headedness and headaches.      Allergies  Aspirin and Sulfamethoxazole-trimethoprim  Home Medications   Current Outpatient Rx  Name  Route  Sig  Dispense  Refill  . albuterol (PROVENTIL HFA;VENTOLIN HFA) 108 (90 BASE) MCG/ACT inhaler   Inhalation   Inhale 2 puffs into the lungs every 6 (six) hours as needed for wheezing or shortness of breath.         . allopurinol (ZYLOPRIM) 300 MG tablet   Oral   Take 300 mg by mouth daily.         [name] amLODipine (NORVASC) 5 MG tablet   Oral   Take 10 mg by mouth daily.         [name] aspirin EC 81 MG tablet   Oral   Take 81 mg by mouth daily.         [name] atorvastatin (LIPITOR) 80 MG tablet    Oral   Take 80 mg by mouth daily.         . Biotin (BIOTIN 5000) 5 MG CAPS   Oral   Take 1 capsule by mouth daily.           [name] buPROPion (WELLBUTRIN SR) 150 MG 12 hr tablet   Oral   Take 1 tablet (150 mg total) by mouth 2 (two) times daily.   30 tablet   5   . Calcium Carbonate-Vitamin D (CALCIUM-VITAMIN D) 500-200 MG-UNIT per tablet   Oral   Take 1 tablet by mouth daily.           . clopidogrel (PLAVIX) 75 MG tablet   Oral   Take 1 tablet (75 mg total) by mouth daily.   90 tablet   3   . conjugated estrogens (PREMARIN) vaginal cream   Vaginal   Place vaginally at bedtime.   90 g   12   . diphenhydrAMINE (SOMINEX) 25 MG tablet   Oral   Take 25 mg by mouth at bedtime as needed for sleep.         . fluocinonide cream (LIDEX) 0.05 %   Topical   Apply 1 application topically daily.         . fluticasone (FLONASE) 50 MCG/ACT nasal spray   Each Nare   Place 2 sprays into both nostrils daily.         . furosemide (LASIX) 20 MG tablet   Oral   Take 20 mg by mouth 2 (two) times daily as needed for fluid. Takes 1 tablet everyday but can take another tablet if needed for swelling.         . haloperidol (HALDOL) 1 MG tablet   Oral   Take 1 tablet (1 mg total) by mouth at bedtime.   30 tablet   2   . losartan (COZAAR) 100 MG tablet   Oral   Take 100 mg by mouth daily.         [name] nystatin (MYCOSTATIN) powder   Topical   Apply 1 g topically daily. Apply to affected area twice daily         . oxybutynin (DITROPAN-XL) 5 MG 24 hr tablet   Oral   Take 1 tablet (5 mg total) by mouth at bedtime.   90 tablet   1   . pantoprazole (PROTONIX) 40 MG tablet   Oral   Take 1 tablet (40 mg total) by mouth daily.   30 tablet  6   . polyethylene glycol (MIRALAX / GLYCOLAX) packet   Oral   Take 17 g by mouth daily as needed (constipation).         . potassium chloride (KLOR-CON 10) 10 MEQ tablet   Oral   Take 1 tablet (10 mEq total) by mouth daily.    90 tablet   1   . traMADol (ULTRAM) 50 MG tablet   Oral   Take 50 mg by mouth 2 (two) times daily.          BP 155/77  Pulse 68  Temp(Src) 98.5 F (36.9 C) (Oral)  Resp 18  SpO2 99% Physical Exam  Nursing note and vitals reviewed. Constitutional: She is oriented to person, place, and time. She appears well-developed and well-nourished.  HENT:  Head: Normocephalic and atraumatic.  Eyes: Conjunctivae are normal. Right eye exhibits no discharge. Left eye exhibits no discharge.  Neck: Normal range of motion. Neck supple. No tracheal deviation present.  Cardiovascular: Normal rate and regular rhythm.   Pulmonary/Chest: Effort normal and breath sounds normal.  Abdominal: Soft. She exhibits no distension. There is tenderness (right flank). There is no guarding.  Musculoskeletal: She exhibits no edema.  Neurological: She is alert and oriented to person, place, and time.  Skin: Skin is warm. No rash noted.  Psychiatric: She has a normal mood and affect.    ED Course  Procedures (including critical care time) Labs Review Labs Reviewed  CBC WITH DIFFERENTIAL - Abnormal; Notable for the following:    RBC 3.58 (*)    Hemoglobin 11.6 (*)    HCT 34.2 (*)    RDW 15.6 (*)    Eosinophils Relative 6 (*)    All other components within normal limits  COMPREHENSIVE METABOLIC PANEL - Abnormal; Notable for the following:    Glucose, Bld 101 (*)    Creatinine, Ser 1.20 (*)    Total Bilirubin 0.2 (*)    GFR calc non Af Amer 44 (*)    GFR calc Af Amer 51 (*)    All other components within normal limits  URINALYSIS, ROUTINE W REFLEX MICROSCOPIC - Abnormal; Notable for the following:    APPearance CLOUDY (*)    Leukocytes, UA SMALL (*)    All other components within normal limits  URINE MICROSCOPIC-ADD ON - Abnormal; Notable for the following:    Squamous Epithelial / LPF FEW (*)    Bacteria, UA FEW (*)    All other components within normal limits   Imaging Review Ct Abdomen Pelvis Wo  Contrast  12/21/2013   CLINICAL DATA:  Abdominal pain, constipation.  EXAM: CT ABDOMEN AND PELVIS WITHOUT CONTRAST  TECHNIQUE: Multidetector CT imaging of the abdomen and pelvis was performed following the standard protocol without intravenous contrast.  COMPARISON:  None available for comparison at time of study interpretation.  FINDINGS: Included view of the lung bases are clear. The visualized heart and pericardium are unremarkable.  Kidneys are orthotopic, demonstrating normal size and morphology without nephrolithiasis, hydronephrosis; limited assessment for renal masses on this nonenhanced examination. 25 mm low-density cyst exophytic from the upper pole of the right kidney with a 2 mm linear calcification along the inferior margin. The unopacified ureters are normal in course and caliber. Urinary bladder is partially distended and unremarkable.  The liver, spleen, pancreas and adrenal glands are unremarkable for this non-contrast examination. Status post cholecystectomy.  The stomach, small and large bowel are normal in course and caliber without inflammatory changes, the sensitivity may be  decreased by lack of enteric contrast. Moderate amount of retained large bowel stool. Small bowel feces. Status post apparent appendectomy. No intraperitoneal free fluid nor free air.  Great vessels are normal in course and caliber with mild calcific atherosclerosis. Evaluation the pelvis limited by streak artifact from bilateral hip arthroplasties. Suspected hysterectomy. Urinary bladder appears well distended unremarkable.  IMPRESSION: Moderate amount of retained large bowel stool without bowel obstruction. Small bowel feces mid suggest chronic stasis.  25 mm right upper pole renal cyst with linear calcification, recommend follow-up.  Status post cholecystectomy and hysterectomy.   Electronically Signed   By: Elon Alas   On: 12/21/2013 04:13    EKG Interpretation   None       MDM   Final diagnoses:   Constipation  Right flank pain    Clinically constipation vs kidney stone vs less likely AAA or other emergent differential. Non toxic appearing.  CT no stones, large stool burden. Enema in ED, pt had stool.  Discussed increasing miralax and added docusate sodium.  Results and differential diagnosis were discussed with the patient. Close follow up outpatient was discussed, patient comfortable with the plan.       Mariea Clonts, MD 12/21/13 [phone]

## 2013-12-21 NOTE — Discharge Instructions (Signed)
Stay well hydrated, plenty of vegetables and continue miralax until soft stools.  If you were given medicines take as directed.  If you are on coumadin or contraceptives realize their levels and effectiveness is altered by many different medicines.  If you have any reaction (rash, tongues swelling, other) to the medicines stop taking and see a physician.   Please follow up as directed and return to the ER or see a physician for new or worsening symptoms.  Thank you.

## 2013-12-21 NOTE — ED Notes (Signed)
Patient transported to CT 

## 2013-12-21 NOTE — ED Notes (Signed)
RN spoke to patient's daughter on the phone, daughter is on the way to pick patient up at this time.

## 2013-12-23 ENCOUNTER — Telehealth (HOSPITAL_COMMUNITY): Payer: Self-pay

## 2013-12-23 NOTE — ED Notes (Signed)
Pts son is at pharmacy and thought she was supposed to have more than 1 Rx.  Chart reviewed and only 1 Rx in chart.  Pharmacy informed.

## 2013-12-29 ENCOUNTER — Other Ambulatory Visit: Payer: Self-pay | Admitting: Internal Medicine

## 2013-12-30 ENCOUNTER — Other Ambulatory Visit: Payer: Self-pay | Admitting: Internal Medicine

## 2013-12-30 NOTE — Telephone Encounter (Signed)
Called to pharm 

## 2014-01-07 ENCOUNTER — Encounter (INDEPENDENT_AMBULATORY_CARE_PROVIDER_SITE_OTHER): Payer: Self-pay | Admitting: General Surgery

## 2014-01-07 ENCOUNTER — Ambulatory Visit (INDEPENDENT_AMBULATORY_CARE_PROVIDER_SITE_OTHER): Payer: Medicare Other | Admitting: General Surgery

## 2014-01-07 VITALS — BP 130/72 | HR 78 | Resp 18 | Ht 63.0 in | Wt 195.0 lb

## 2014-01-07 DIAGNOSIS — R222 Localized swelling, mass and lump, trunk: Secondary | ICD-10-CM

## 2014-01-07 NOTE — Progress Notes (Signed)
Subjective:     Patient ID: [name], female   DOB: 06/11/1941, 73 y.o.   MRN: [id number]  HPI 89 yof with multiple medical problems who has had a history of an infected sebaceous cyst on her chest wall overlying her sternum. This had healed but the cyst remains and she would like it removed.  She has no drainage from it now. No fevers. No real tenderness. I saw her months ago and this is smaller now. It is not that bothersome.  She lives by herself. She has had some falls attributed to her medication.   Review of Systems     Objective:   Physical Exam  Pulmonary/Chest:     Cardiovascular: Normal rate, regular rhythm and normal heart sounds.  Pulmonary/Chest: Effort normal and breath sounds normal. She has no wheezes. She has no rales.    Lymphadenopathy:  She has no cervical adenopathy.        Assessment:     Sebaceous cyst, chest wall    Plan:     I told her this area does not need to be removed. She is having some symptoms and does have a history of infection. She very much would like it removed. Most of her reasons are for cosmesis and I told her that I will think this will look any better after I remove it at all and she voices her understanding to that. I told her that I think we can probably do this in the office at this point. She is going to need a ride to and from that date. She will need to be cleared for by her medical doctor to have this done even under local. She will also need recommendations on what to do with her Plavix around the time of operation. I will schedule this once all of that is fulfilled. We discussed the risks in detail.

## 2014-01-08 ENCOUNTER — Encounter (INDEPENDENT_AMBULATORY_CARE_PROVIDER_SITE_OTHER): Payer: Self-pay

## 2014-01-12 ENCOUNTER — Encounter: Payer: Self-pay | Admitting: Internal Medicine

## 2014-01-15 ENCOUNTER — Telehealth (INDEPENDENT_AMBULATORY_CARE_PROVIDER_SITE_OTHER): Payer: Self-pay

## 2014-01-15 NOTE — Telephone Encounter (Signed)
N/A tried calling pt back to let her know the status of where we are with the medical clearance.

## 2014-01-15 NOTE — Telephone Encounter (Signed)
Called to check on the status of the medical clearance along with instructions of stopping the Plavix. The office did receive the clearance but the physician did not address the clearance yet. They will check on this today and let me know. I advised them that the pt is calling us b/c she wants to schedule sx ASAP to be able to make arrangements for family to stay with her after surgery.

## 2014-01-15 NOTE — Telephone Encounter (Signed)
Pt calling to f/u on status of cardiac clearance.  She was told that a letter was sent to her cardiologist on 01/08/14, and it can take several days for a response.  She is anxious to schedule so she can make arrangements for her daughter to be with her.

## 2014-01-19 ENCOUNTER — Telehealth (INDEPENDENT_AMBULATORY_CARE_PROVIDER_SITE_OTHER): Payer: Self-pay | Admitting: *Deleted

## 2014-01-19 NOTE — Telephone Encounter (Signed)
Pt states she is waiting on a response from you regarding her surgery to get scheduled?  She said you was supposed to call her other dr about stopping the Plavix.  Please advise.

## 2014-01-21 NOTE — Telephone Encounter (Signed)
Called pt to notify her what is going on about the clearance. I advised pt that she is going to need to be seen by one of the doctors at [facility] in order to receive clearance w/instructions on holding the Plavix. The pt said they did call her with an appt for 01/26/14 but the pt canceled the appt b/c of transportation issues. I advised pt that she needs to call them back to r/s an appt for the day that her transportation can bring her to the appt. The pt is aware and will call to make the appt.

## 2014-01-21 NOTE — Telephone Encounter (Signed)
Called again to check on the status of the medical clearance with instructions on holding the Plavix 5 days before office surgery. [facility] had tried getting the clearance signed by a MD but the pt has canceled her last couple of appt's with their office. The pt will not get cleared until she has an appt with their office. The pt is not getting along with Dr Mechele Claude so the pt will need to someone in the group. I still reminded them that we need a MD to sign off on the clearance per Dr Cristal Generous request. They will call the pt to schedule an appt. I will also call the pt to relay this message as well.

## 2014-01-21 NOTE — Telephone Encounter (Signed)
Pt called to let you know she has an appt to see a MD 01/29/14 @ 1:45.. Just FYI!  Anderson Malta

## 2014-01-26 ENCOUNTER — Encounter: Payer: Self-pay | Admitting: Internal Medicine

## 2014-01-29 ENCOUNTER — Other Ambulatory Visit (HOSPITAL_COMMUNITY): Payer: Self-pay | Admitting: Psychiatry

## 2014-01-29 ENCOUNTER — Ambulatory Visit (INDEPENDENT_AMBULATORY_CARE_PROVIDER_SITE_OTHER): Payer: Medicare Other | Admitting: Internal Medicine

## 2014-01-29 ENCOUNTER — Other Ambulatory Visit: Payer: Self-pay | Admitting: Internal Medicine

## 2014-01-29 ENCOUNTER — Encounter: Payer: Self-pay | Admitting: Internal Medicine

## 2014-01-29 ENCOUNTER — Ambulatory Visit (HOSPITAL_COMMUNITY)
Admission: RE | Admit: 2014-01-29 | Discharge: 2014-01-29 | Disposition: A | Discharge status: Home / Self Care | Payer: Medicare Other | Source: Ambulatory Visit | Attending: Internal Medicine | Admitting: Internal Medicine

## 2014-01-29 VITALS — BP 149/83 | HR 83 | Temp 97.2°F | Ht 63.0 in | Wt 195.9 lb

## 2014-01-29 DIAGNOSIS — I5032 Chronic diastolic (congestive) heart failure: Secondary | ICD-10-CM | POA: Insufficient documentation

## 2014-01-29 DIAGNOSIS — L089 Local infection of the skin and subcutaneous tissue, unspecified: Secondary | ICD-10-CM

## 2014-01-29 DIAGNOSIS — Z01810 Encounter for preprocedural cardiovascular examination: Secondary | ICD-10-CM

## 2014-01-29 DIAGNOSIS — Z8673 Personal history of transient ischemic attack (TIA), and cerebral infarction without residual deficits: Secondary | ICD-10-CM

## 2014-01-29 DIAGNOSIS — L723 Sebaceous cyst: Secondary | ICD-10-CM

## 2014-01-29 NOTE — Patient Instructions (Signed)
-  It was great seeing you today! -Don't take Plavix for 5 days prior to your surgery.  -Call Dr. Donne Hazel on Monday to schedule your surgery.  -Follow up with your PCP in one month.  -Have a Happy Easter!

## 2014-01-30 NOTE — Progress Notes (Signed)
Case discussed with Dr. Kennerly soon after the resident saw the patient.  We reviewed the resident's history and exam and pertinent patient test results.  I agree with the assessment, diagnosis, and plan of care documented in the resident's note. 

## 2014-01-30 NOTE — Progress Notes (Signed)
   Subjective:    Patient ID: [name], female    DOB: [birth year], 73 y.o.   MRN: [id number]  HPI [name] is a 73 yr old woman with complex medical history, significant for history of TIA and on Plavix, CKD stage 3, and dCHF who presents for visit to obtain medical clearance for removal of an infected sebaceous cyst on her chest overlying her sternum. She states that she is in her usual state of health and has had this type of procedure before with her Plavix held 3-5 days prior to surgery. She does not recall when exactly she was started on Plavix or why she is taking it. She believes it has been over 10 years and the indication perhaps was for stroke prevention as she had a TIA but she does not recall ever seeing a Garment/textile technologist. She states that she has been coming to the [facility] for almost 10 years and was already on Plavix when she established care here.    Review of Systems  Constitutional: Negative for fever, chills, diaphoresis, activity change, appetite change, fatigue and unexpected weight change.  HENT: Positive for hearing loss.   Respiratory: Negative for cough, shortness of breath and wheezing.   Cardiovascular: Negative for chest pain, palpitations and leg swelling.  Gastrointestinal: Negative for abdominal pain.  Genitourinary: Negative for dysuria and frequency.  Musculoskeletal: Positive for back pain and gait problem.  Skin:       Cystic mass of chest wall  Neurological: Negative for dizziness, weakness, light-headedness and headaches.  Psychiatric/Behavioral: Negative for confusion and agitation.       Objective:   Physical Exam  Nursing note and vitals reviewed. Constitutional: She is oriented to person, place, and time. No distress.  In no distress, using her walker to ambulate  HENT:  Wearing hearing aids    Cardiovascular: Normal rate and regular rhythm.   Pulmonary/Chest: Effort normal and breath sounds normal. No respiratory distress. She has no  wheezes. She has no rales.  She has a ~1in in diameter chest wall cystic mass over her sternum with dark discoloration but no surrounding erythema or edema.   Abdominal: Soft. There is no tenderness.  Musculoskeletal: She exhibits no edema and no tenderness.  Neurological: She is alert and oriented to person, place, and time.  Skin: Skin is warm and dry. No rash noted. She is not diaphoretic. No erythema. No pallor.  Psychiatric: She has a normal mood and affect.          Assessment & Plan:

## 2014-01-30 NOTE — Assessment & Plan Note (Addendum)
Obtained EKG during this visit as pt had not had one since 2011 and she does have dCHF--EKG was unchanged from baseline.   Her perioperative risk is low. Using the [place] periop risk calculation is 1.04% for and MI of CA based on an ASA score of 3, partial ADL independence, and major chronic illnesses for surgery involving her skin.    She was instructed to hold Plavix for 5 days prior to her surgery She will call her Surgeon's office on Monday to schedule her surgery.

## 2014-02-02 ENCOUNTER — Other Ambulatory Visit: Payer: Self-pay | Admitting: Internal Medicine

## 2014-02-03 ENCOUNTER — Telehealth (INDEPENDENT_AMBULATORY_CARE_PROVIDER_SITE_OTHER): Payer: Self-pay

## 2014-02-03 ENCOUNTER — Encounter (INDEPENDENT_AMBULATORY_CARE_PROVIDER_SITE_OTHER): Payer: Self-pay

## 2014-02-03 NOTE — Telephone Encounter (Signed)
Called pt to notify her that we did receive medical clearance for her to have the office surgery done by Dr Donne Hazel. I advised pt that she will need to HOLD the Plavix 5 days before surgery. The pt wants me to send her a letter in the mail with the appt date and that she is having office surgery to send to her daughter. The pt understands to hold the Plavix starting 03/16/14.

## 2014-02-03 NOTE — Telephone Encounter (Signed)
This is no longer my clinic patient. I believe this patient was transferred to Dr. Eulas Post given patient no longer able to attend my clinics on Monday.

## 2014-02-05 ENCOUNTER — Ambulatory Visit (INDEPENDENT_AMBULATORY_CARE_PROVIDER_SITE_OTHER): Payer: Medicare Other | Admitting: Psychiatry

## 2014-02-05 DIAGNOSIS — F200 Paranoid schizophrenia: Secondary | ICD-10-CM

## 2014-02-06 ENCOUNTER — Other Ambulatory Visit: Payer: Self-pay | Admitting: Internal Medicine

## 2014-02-09 ENCOUNTER — Other Ambulatory Visit: Payer: Self-pay | Admitting: *Deleted

## 2014-02-09 NOTE — Progress Notes (Signed)
   THERAPIST PROGRESS NOTE  Session Time: 2:00-2:50  Participation Level: Active  Behavioral Response: CasualAlertEuthymic  Type of Therapy: Individual Therapy  Treatment Goals addressed: coping, grief counseling  Interventions: Strength-based and Supportive  Summary: [name] is a 73 y.o. female who presents with anxiety.   Suicidal/Homicidal: Nowithout intent/plan  Therapist Response: Pt. Presents with bright mood, talkative. Pt. Processed service and anniversary of her son's death. Spent most of session normalizing the grief process. Pt. Discussed feelings of loss of control related in relationship with her adult daughter who often provides care giving that she does not want.  Plan: Continue with supportive and strength-based, person centered therapy. Return again in 4 weeks.  Diagnosis: Axis I: Anxiety Disorder NOS    Axis II: No diagnosis    Renford Dills 02/09/2014

## 2014-02-10 ENCOUNTER — Other Ambulatory Visit: Payer: Self-pay | Admitting: *Deleted

## 2014-02-11 MED ORDER — TRAMADOL HCL 50 MG PO TABS: 50.0000 mg | ORAL_TABLET | Freq: Two times a day (BID) | ORAL | Status: DC | PRN

## 2014-02-11 NOTE — Telephone Encounter (Signed)
Rx called in to pharmacy - pt aware. 

## 2014-02-11 NOTE — Addendum Note (Signed)
Addended by: Madilyn Fireman on: 02/11/2014 03:39 PM   Modules accepted: Orders

## 2014-02-19 ENCOUNTER — Encounter (HOSPITAL_COMMUNITY): Payer: Self-pay | Admitting: Psychiatry

## 2014-02-19 ENCOUNTER — Ambulatory Visit (INDEPENDENT_AMBULATORY_CARE_PROVIDER_SITE_OTHER): Payer: Medicare Other | Admitting: Psychiatry

## 2014-02-19 VITALS — BP 144/84 | HR 69 | Ht 63.0 in | Wt 196.8 lb

## 2014-02-19 DIAGNOSIS — F200 Paranoid schizophrenia: Secondary | ICD-10-CM

## 2014-02-19 DIAGNOSIS — F29 Unspecified psychosis not due to a substance or known physiological condition: Secondary | ICD-10-CM

## 2014-02-19 MED ORDER — BUPROPION HCL ER (XL) 300 MG PO TB24: 300.0000 mg | ORAL_TABLET | ORAL | Status: DC

## 2014-02-19 MED ORDER — HALOPERIDOL 2 MG PO TABS: 2.0000 mg | ORAL_TABLET | Freq: Every day | ORAL | Status: DC

## 2014-02-19 NOTE — Progress Notes (Signed)
[facility] [phone] Progress Note  [name] [id number] 73 y.o.  02/19/2014 2:31 PM  Chief Complaint: My daughter did not moved out and she is making me crazy.  I need highre dose of medication.     History of Present Illness: [name] came for her followup appointment.  She is complaining of increased paranoia and lack of sleep.  She is not happy because her daughter did not moved out .  Patient continues to have issues with her daughter.  Lately she admitted having the arguments with her.  Patient continued to endorse paranoid behavior and sometimes visual hallucination .  She continued to believe sometimes people are in the house however she denies any suicidal thoughts or homicidal thoughts.  She likes the Haldol but believes that dose is not strong.  Patient denies any tremors, shakes or any extrapyramidal side effects.  Sometimes she has visual hallucinations and seeing things the patient is not drinking or using any illegal substances.  She continues to have trust issue.  In the past she was reluctant to increase the Haldol however now she agreed to try to milligram.  Her appetite is good.  Her vitals are stable.  She recently seen her primary care physician and had blood work.  She is taking Wellbutrin SR 150 mg it is prescribed by her physician.  Patient will sometimes she feel evening dose of Wellbutrin causing her more restlessness.  Patient is seeing [name] in this office for coping and social skills.  Suicidal Ideation: No Plan Formed: No Patient has means to carry out plan: No  Homicidal Ideation: No Plan Formed: No Patient has means to carry out plan: No  Review of Systems: Psychiatric: Agitation: Irritability Hallucination: Yes Depressed Mood: No Insomnia: Yes Hypersomnia: No Altered Concentration: No Feels Worthless: No Grandiose Ideas: No Belief In Special Powers: No New/Increased Substance Abuse: No Compulsions: No  Neurologic: Headache:  No Seizure: No Paresthesias: No  Past Medical Family, Social History:  Patient was born and raised in [place]. She had 2 sons and one daughter.  Both her sons are in jail. Patient had another son who committed suicide in 04-28-12 .  Patient husband died many years ago.  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  His brother died in a mental health hospital in [place].  Patient has multiple health problems.  She has GERD, gout, hypertension, degenerative joint disease, osteoarthritis, TIA and bilateral hearing loss.  She has history of mitral valve prolapse, chronic venous insufficiency, diverticulitis hyperlipidemia obesity and seasonal allergies. [name].    Outpatient Encounter Prescriptions as of 02/19/2014  Medication Sig  . haloperidol (HALDOL) 2 MG tablet Take 1 tablet (2 mg total) by mouth at bedtime.  . [DISCONTINUED] buPROPion (WELLBUTRIN SR) 150 MG 12 hr tablet Take 1 tablet (150 mg total) by mouth 2 (two) times daily.  . [DISCONTINUED] haloperidol (HALDOL) 1 MG tablet Take 1 tablet (1 mg total) by mouth at bedtime.  [name] albuterol (PROVENTIL HFA;VENTOLIN HFA) 108 (90 BASE) MCG/ACT inhaler Inhale 2 puffs into the lungs every 6 (six) hours as needed for wheezing or shortness of breath.  . allopurinol (ZYLOPRIM) 300 MG tablet Take 300 mg by mouth daily.  [name] allopurinol (ZYLOPRIM) 300 MG tablet TAKE 1 TABLET BY MOUTH DAILY  . amLODipine (NORVASC) 5 MG tablet Take  10 mg by mouth daily.  [name] aspirin EC 81 MG tablet Take 81 mg by mouth daily.  [name] atorvastatin (LIPITOR) 80 MG tablet Take 80 mg by mouth daily.  . Biotin (BIOTIN 5000) 5 MG CAPS Take 1 capsule by mouth daily.    [name] buPROPion (WELLBUTRIN XL) 300 MG 24 hr tablet Take 1 tablet (300 mg total) by mouth every morning.  . Calcium Carbonate-Vitamin D (CALCIUM-VITAMIN D)  500-200 MG-UNIT per tablet Take 1 tablet by mouth daily.    . clopidogrel (PLAVIX) 75 MG tablet Take 1 tablet (75 mg total) by mouth daily.  [name] conjugated estrogens (PREMARIN) vaginal cream Place vaginally at bedtime.  . diphenhydrAMINE (SOMINEX) 25 MG tablet Take 25 mg by mouth at bedtime as needed for sleep.  [name] docusate sodium (COLACE) 100 MG capsule Take 1 capsule (100 mg total) by mouth every 12 (twelve) hours.  . fluocinonide cream (LIDEX) 9.52 % Apply 1 application topically daily.  . fluticasone (FLONASE) 50 MCG/ACT nasal spray Place 2 sprays into both nostrils daily.  . fluticasone (FLONASE) 50 MCG/ACT nasal spray INHALE 2 SPRAYS INTRANASALLY ONCE DAILY  . furosemide (LASIX) 20 MG tablet Take 20 mg by mouth 2 (two) times daily as needed for fluid. Takes 1 tablet everyday but can take another tablet if needed for swelling.  [name] losartan (COZAAR) 100 MG tablet Take 100 mg by mouth daily.  [name] nystatin (MYCOSTATIN) powder Apply 1 g topically daily. Apply to affected area twice daily  . oxybutynin (DITROPAN-XL) 5 MG 24 hr tablet TAKE 1 TABLET BY MOUTH EVERY NIGHT AT BEDTIME  . pantoprazole (PROTONIX) 40 MG tablet Take 1 tablet (40 mg total) by mouth daily.  . polyethylene glycol (MIRALAX / GLYCOLAX) packet Take 17 g by mouth daily as needed (constipation).  . potassium chloride (KLOR-CON 10) 10 MEQ tablet Take 1 tablet (10 mEq total) by mouth daily.  [name] PROAIR HFA 108 (90 BASE) MCG/ACT inhaler INHALE 2 PUFFS INTO THE LUNGS EVERY 6 (SIX) HOURS AS NEEDED FOR WHEEZING  . traMADol (ULTRAM) 50 MG tablet Take 1 tablet (50 mg total) by mouth every 12 (twelve) hours as needed for moderate pain.    Past Psychiatric History/Hospitalization(s): Patient admitted history of one psychiatric hospitalization many years ago. Patient remember she was trying to jump off a window when her husband saved her. She was admitted at [facility] for 3 weeks.  She was seen by this writer in 2013 and given Haldol which  helped her but patient stopped taking the medication because she was feeling better.   Anxiety: Yes Bipolar Disorder: No Depression: Yes Mania: No Psychosis: Yes Schizophrenia: Yes Personality Disorder: No Hospitalization for psychiatric illness: Yes History of Electroconvulsive Shock Therapy: No Prior Suicide Attempts: Patient tried to jump off from the window but her husband rescued her.  Physical Exam: Constitutional:  BP 144/84  Pulse 69  Ht 5' 3"  (1.6 m)  Wt 196 lb 12.8 oz (89.268 kg)  BMI 34.87 kg/m2  Recent Results (from the past 2160 hour(s))  CBC WITH DIFFERENTIAL     Status: Abnormal   Collection Time    12/20/13 10:02 PM      Result Value Ref Range   WBC 6.8  4.0 - 10.5 K/uL   RBC 3.58 (*) 3.87 - 5.11 MIL/uL   Hemoglobin 11.6 (*) 12.0 - 15.0 g/dL   HCT 34.2 (*) 36.0 - 46.0 %   MCV 95.5  78.0 - 100.0 fL   MCH 32.4  26.0 -  34.0 pg   MCHC 33.9  30.0 - 36.0 g/dL   RDW 15.6 (*) 11.5 - 15.5 %   Platelets 225  150 - 400 K/uL   Neutrophils Relative % 43  43 - 77 %   Neutro Abs 3.0  1.7 - 7.7 K/uL   Lymphocytes Relative 45  12 - 46 %   Lymphs Abs 3.0  0.7 - 4.0 K/uL   Monocytes Relative 6  3 - 12 %   Monocytes Absolute 0.4  0.1 - 1.0 K/uL   Eosinophils Relative 6 (*) 0 - 5 %   Eosinophils Absolute 0.4  0.0 - 0.7 K/uL   Basophils Relative 0  0 - 1 %   Basophils Absolute 0.0  0.0 - 0.1 K/uL  COMPREHENSIVE METABOLIC PANEL     Status: Abnormal   Collection Time    12/20/13 10:02 PM      Result Value Ref Range   Sodium 138  137 - 147 mEq/L   Potassium 4.1  3.7 - 5.3 mEq/L   Chloride 100  96 - 112 mEq/L   CO2 26  19 - 32 mEq/L   Glucose, Bld 101 (*) 70 - 99 mg/dL   BUN 18  6 - 23 mg/dL   Creatinine, Ser 1.20 (*) 0.50 - 1.10 mg/dL   Calcium 9.2  8.4 - 10.5 mg/dL   Total Protein 7.4  6.0 - 8.3 g/dL   Albumin 3.6  3.5 - 5.2 g/dL   AST 29  0 - 37 U/L   ALT 24  0 - 35 U/L   Alkaline Phosphatase 98  39 - 117 U/L   Total Bilirubin 0.2 (*) 0.3 - 1.2 mg/dL   GFR calc  non Af Amer 44 (*) >90 mL/min   GFR calc Af Amer 51 (*) >90 mL/min   Comment: (NOTE)     The eGFR has been calculated using the CKD EPI equation.     This calculation has not been validated in all clinical situations.     eGFR's persistently <90 mL/min signify possible Chronic Kidney     Disease.  URINALYSIS, ROUTINE W REFLEX MICROSCOPIC     Status: Abnormal   Collection Time    12/21/13  1:58 AM      Result Value Ref Range   Color, Urine YELLOW  YELLOW   APPearance CLOUDY (*) CLEAR   Specific Gravity, Urine 1.018  1.005 - 1.030   pH 7.0  5.0 - 8.0   Glucose, UA NEGATIVE  NEGATIVE mg/dL   Hgb urine dipstick NEGATIVE  NEGATIVE   Bilirubin Urine NEGATIVE  NEGATIVE   Ketones, ur NEGATIVE  NEGATIVE mg/dL   Protein, ur NEGATIVE  NEGATIVE mg/dL   Urobilinogen, UA 1.0  0.0 - 1.0 mg/dL   Nitrite NEGATIVE  NEGATIVE   Leukocytes, UA SMALL (*) NEGATIVE  URINE MICROSCOPIC-ADD ON     Status: Abnormal   Collection Time    12/21/13  1:58 AM      Result Value Ref Range   Squamous Epithelial / LPF FEW (*) RARE   WBC, UA 0-2  <3 WBC/hpf   RBC / HPF 0-2  <3 RBC/hpf   Bacteria, UA FEW (*) RARE   Urine-Other AMORPHOUS URATES/PHOSPHATES      General Appearance: obese and Casually dressed.  She appears older than her stated age.  She has difficulty walking.  Musculoskeletal: Strength & Muscle Tone: Patient uses walker for ambulation.  She has chronic pain. Gait & Station: unsteady Patient leans:  Front and Backward  Psychiatric: Speech (describe rate, volume, coherence, spontaneity, and abnormalities if any):  Fast, rambling with increased tone and volume.    Thought Process (describe rate, content, abstract reasoning, and computation):  circumstantial and difficult to organize her thinking.  Sometimes illogical.  Associations: Circumstantial and Loose, paranoia  Thoughts: hallucinations  Mental Status: Orientation: oriented to person, place and time/date Mood & Affect: labile  affect Attention Span & Concentration: poor  Established Problem, Stable/Improving (1), Review of Psycho-Social Stressors (1), Review and summation of old records (2), Established Problem, Worsening (2), Review of Last Therapy Session (1), Review of Medication Regimen & Side Effects (2) and Review of New Medication or Change in Dosage (2)  Assessment: Axis I:  psychosis NOS, rule out paranoid schizophrenia, rule out major depressive disorder with psychosis  Axis II: deferred  Axis III:  see medical history  Axis IV: moderate  Axis V:  V. 50-55   Plan:   I review records from her primary care physician including her results.  We will increase her Haldol 2 mg at bedtime to help the paranoia and hallucination.  I would also change Wellbutrin to XL 300 mg once in the morning suspicious complaining of restlessness but the evening dose.  Recommended to see counselor for coping and social skills.  Discussed in detail the risks and benefits of medication especially EPS, postural hypotension, sedation and tremors.  Recommended to call us back if she has any question or any concern.  I will see her again in 3 months.  More than 50% of the time spent in psychoeducation, counseling and coordination of care.  Discuss safety plan that anytime having active suicidal thoughts or homicidal thoughts then patient need to call 911 or go to the local emergency room.  Lindsey Demonte T., MD 02/19/2014

## 2014-02-25 ENCOUNTER — Ambulatory Visit (HOSPITAL_COMMUNITY): Payer: Self-pay | Admitting: Psychiatry

## 2014-02-27 ENCOUNTER — Other Ambulatory Visit: Payer: Self-pay | Admitting: Internal Medicine

## 2014-02-27 DIAGNOSIS — I10 Essential (primary) hypertension: Secondary | ICD-10-CM

## 2014-03-02 ENCOUNTER — Other Ambulatory Visit: Payer: Self-pay | Admitting: Internal Medicine

## 2014-03-02 ENCOUNTER — Telehealth: Payer: Self-pay | Admitting: Internal Medicine

## 2014-03-02 NOTE — Telephone Encounter (Signed)
The patient has been on Premarin cream for atrophic vaginitis and vasomotor symptoms (Dr Vickii Chafe Constant's note 10/2012). The patient has been using Premarin for >2 years, daily application to her vagina and feels relief from symptoms. I have asked the patient today to decrease the frequency of use and see if the symptoms come back. If she has recurrence of symptoms then we will increase the dose and refer her for a follow up visit with Dr Roland Rack. The patient agrees with this plan, and voices understanding. She will try the reduced dosage for a month and call back if she experiences worsening.

## 2014-03-02 NOTE — Telephone Encounter (Signed)
Patient needs to come in for BMP check for creatinine.

## 2014-03-17 ENCOUNTER — Other Ambulatory Visit: Payer: Self-pay | Admitting: *Deleted

## 2014-03-18 ENCOUNTER — Other Ambulatory Visit: Payer: Self-pay | Admitting: Internal Medicine

## 2014-03-20 ENCOUNTER — Ambulatory Visit (INDEPENDENT_AMBULATORY_CARE_PROVIDER_SITE_OTHER): Payer: Medicare Other | Admitting: General Surgery

## 2014-03-20 ENCOUNTER — Encounter (INDEPENDENT_AMBULATORY_CARE_PROVIDER_SITE_OTHER): Payer: Self-pay | Admitting: General Surgery

## 2014-03-20 ENCOUNTER — Other Ambulatory Visit: Payer: Self-pay | Admitting: Internal Medicine

## 2014-03-20 VITALS — BP 142/70 | Resp 18 | Ht 63.0 in | Wt 197.0 lb

## 2014-03-20 DIAGNOSIS — L723 Sebaceous cyst: Secondary | ICD-10-CM

## 2014-03-20 NOTE — Telephone Encounter (Signed)
Called to pharm 

## 2014-03-24 NOTE — Progress Notes (Signed)
Subjective:     Patient ID: [name], female   DOB: 1941/01/17, 73 y.o.   MRN: [id number]  HPI 47 yof with multiple medical problems who has symptomatic sebaceous cyst on her chest wall.  She wanted this excised. She has been off her plavix.  Review of Systems     Objective:   Physical Exam    3x2 cm chest wall sebaceous cyst  Assessment:     Sebaceous cyst     Plan:     We discussed excision.  I cleansed the area with cholorprep.  I then made an elliptical incision and excised the entire what appeared to be a sebaceous cyst with the skin opening.  Hemostasis was obtained.  I then closed the deep layer with 3-0 vicryl and the skin with 3-0 nylon.  A dressing was placed. The specimen was sent to pathology.  She was sent home doing well with follow up in the next couple weeks.

## 2014-03-25 ENCOUNTER — Telehealth: Payer: Self-pay | Admitting: *Deleted

## 2014-03-25 ENCOUNTER — Telehealth (INDEPENDENT_AMBULATORY_CARE_PROVIDER_SITE_OTHER): Payer: Self-pay

## 2014-03-25 NOTE — Telephone Encounter (Signed)
Pt called clinic about having not heard about knee brace - she said about a month ago was seen in clinic. I saw no order for knee brace or mention in notes. Suggest appt - no meassurements on knee. Hilda Blades Gamal Todisco RN 03/25/14 4:45PM

## 2014-03-25 NOTE — Telephone Encounter (Signed)
N/A tried calling pt to give her the good news on the pathology report that it's a benign cyst per Dr Donne Hazel.

## 2014-03-25 NOTE — Telephone Encounter (Signed)
Pt called clinic and pharmacy is to send med this week.

## 2014-03-26 NOTE — Telephone Encounter (Signed)
[name],    I saw [name] almost 2 months ago (4/2) for medical clearance for her surgery but not for knee problems. Perhaps the patient had discussed this with someone else or maybe she needs to be seen for knee problems? Let me know if I can help with anything.   Thanks,   SK

## 2014-03-26 NOTE — Telephone Encounter (Signed)
Pt called me back. I gave her the good news on the pathology report being benign cyst per Dr Donne Hazel.

## 2014-03-27 ENCOUNTER — Other Ambulatory Visit: Payer: Self-pay | Admitting: Internal Medicine

## 2014-03-30 NOTE — Telephone Encounter (Signed)
Tramadol rx faxed to [facility]. 

## 2014-03-31 NOTE — Telephone Encounter (Signed)
Talked with pt -  appt made 04/16/14 1:15PM Dr Algis Liming - pt request date and time due to problems with transportation. Problems with left knee - pt states bone spurs.

## 2014-04-02 ENCOUNTER — Other Ambulatory Visit: Payer: Self-pay

## 2014-04-02 ENCOUNTER — Ambulatory Visit (INDEPENDENT_AMBULATORY_CARE_PROVIDER_SITE_OTHER): Payer: Medicare Other | Admitting: General Surgery

## 2014-04-02 ENCOUNTER — Encounter (INDEPENDENT_AMBULATORY_CARE_PROVIDER_SITE_OTHER): Payer: Self-pay | Admitting: General Surgery

## 2014-04-02 VITALS — BP 127/86 | HR 77 | Temp 98.6°F | Resp 16 | Ht 63.0 in | Wt 199.0 lb

## 2014-04-02 DIAGNOSIS — Z09 Encounter for follow-up examination after completed treatment for conditions other than malignant neoplasm: Secondary | ICD-10-CM

## 2014-04-02 DIAGNOSIS — Z1231 Encounter for screening mammogram for malignant neoplasm of breast: Secondary | ICD-10-CM

## 2014-04-02 NOTE — Progress Notes (Signed)
Subjective:     Patient ID: [name], female   DOB: 1940/11/18, 73 y.o.   MRN: [id number]  HPI 10 yof s/p excision of a sebaceous cyst of a chest wall mass. She is doing well without complaint. Path is a benign EIC.  Review of Systems     Objective:   Physical Exam Healing chest wall incision without infection,stitches in place    Assessment:     S/p eic excision     Plan:     I removed stitches and applied steristrips.  She can return to full activity.

## 2014-04-16 ENCOUNTER — Ambulatory Visit (INDEPENDENT_AMBULATORY_CARE_PROVIDER_SITE_OTHER): Payer: Medicare Other | Admitting: Internal Medicine

## 2014-04-16 VITALS — BP 126/66 | HR 66 | Temp 97.3°F | Ht 63.0 in | Wt 203.5 lb

## 2014-04-16 DIAGNOSIS — Z760 Encounter for issue of repeat prescription: Secondary | ICD-10-CM

## 2014-04-16 DIAGNOSIS — M1710 Unilateral primary osteoarthritis, unspecified knee: Secondary | ICD-10-CM

## 2014-04-16 DIAGNOSIS — M1990 Unspecified osteoarthritis, unspecified site: Secondary | ICD-10-CM

## 2014-04-16 DIAGNOSIS — IMO0002 Reserved for concepts with insufficient information to code with codable children: Secondary | ICD-10-CM

## 2014-04-16 MED ORDER — ATORVASTATIN CALCIUM 80 MG PO TABS: 80.0000 mg | ORAL_TABLET | Freq: Every day | ORAL | Status: DC

## 2014-04-16 MED ORDER — OXYBUTYNIN CHLORIDE ER 5 MG PO TB24: 5.0000 mg | ORAL_TABLET | Freq: Every day | ORAL | Status: DC

## 2014-04-16 MED ORDER — LOSARTAN POTASSIUM 100 MG PO TABS: 100.0000 mg | ORAL_TABLET | Freq: Every day | ORAL | Status: DC

## 2014-04-16 MED ORDER — ALLOPURINOL 300 MG PO TABS: 300.0000 mg | ORAL_TABLET | Freq: Every day | ORAL | Status: DC

## 2014-04-16 MED ORDER — CLOPIDOGREL BISULFATE 75 MG PO TABS: 75.0000 mg | ORAL_TABLET | Freq: Every day | ORAL | Status: DC

## 2014-04-16 MED ORDER — TRAMADOL HCL 50 MG PO TABS: 50.0000 mg | ORAL_TABLET | Freq: Four times a day (QID) | ORAL | Status: DC | PRN

## 2014-04-16 MED ORDER — ALBUTEROL SULFATE HFA 108 (90 BASE) MCG/ACT IN AERS: 2.0000 | INHALATION_SPRAY | Freq: Four times a day (QID) | RESPIRATORY_TRACT | Status: DC | PRN

## 2014-04-16 NOTE — Progress Notes (Signed)
Subjective:    Patient ID: [name], female    DOB: 1941-03-23, 73 y.o.   MRN: [id number]  HPI [name] is a 73 yo woman pmh as listed below presents for medication refills and ongoing knee pain.   Patient states that the pain has been ongoing many years ago and is throbbing and grinding in nature. She has tried tramadol for relief. Falling on the knee exacerbates the pain and movement and tramadol relieves the pain. She has a orthopedics physician, Dr. Lorin Mercy whom performed her bilateral hip replacements and has evaluated her knee and didn't recommend replacement given her hips. She is interested in a knee brace that has been ordered several times before but the patient has not yet received. She states that long periods of sleep and then moving causes some stiffness in her hips and knees but improves with activity. She has no other joint complaints, muscle weakness, or joint swelling. Her stiffness is never more than 10 min in the morning.   She is very reflective today as changes the anniversary of her child's death which was very traumatic for the patient. She shares several stories regarding her family history and father's time in the war and she is not actively homicidal or suicidal during this visit and states that she is following up regularly with her therapist.  She is very much interested in meeting her primary care physician and just wanted to get refills on her medications as they are provided by a mail in pharmacy.  Past Medical History  Diagnosis Date  . History of cervical cancer 1972  . Ovarian cancer 1972    S/P oophorectomy  . GERD (gastroesophageal reflux disease)   . Gout   . Hypertension   . DJD (degenerative joint disease), multiple sites     Low back pain worst  . DDD (degenerative disc disease), lumbosacral   . Osteoarthritis of hip     bilateral hips  . Hx-TIA (transient ischemic attack) 05/13/2001    Right facial numbness  . Sensorineural hearing  loss of both ears   . Mitral valve prolapse 10/27/1999    Subsequent 2D echo show normal mitral valve  . Chronic venous insufficiency   . Colon, diverticulosis Sept. 2011    outpatient colonoscopy by Dr. Cristina Gong.  Need record  . Hyperlipidemia   . Recurrent boils     History of MRSA skin infections with abscess  . Gout     Uric Acid level 4.2 on 300 allopurinol  . Depression    Current Outpatient Prescriptions on File Prior to Visit  Medication Sig Dispense Refill  . amLODipine (NORVASC) 5 MG tablet TAKE 2 TABLETS BY MOUTH EVERY DAY  60 tablet  6  . aspirin EC 81 MG tablet Take 81 mg by mouth daily.      . Biotin (BIOTIN 5000) 5 MG CAPS Take 1 capsule by mouth daily.        [name] buPROPion (WELLBUTRIN XL) 300 MG 24 hr tablet Take 1 tablet (300 mg total) by mouth every morning.  30 tablet  2  . Calcium Carbonate-Vitamin D (CALCIUM-VITAMIN D) 500-200 MG-UNIT per tablet Take 1 tablet by mouth daily.        [name] conjugated estrogens (PREMARIN) vaginal cream Place vaginally 2 (two) times a week.  30 g  2  . diphenhydrAMINE (SOMINEX) 25 MG tablet Take 25 mg by mouth at bedtime as needed for sleep.      [name] docusate sodium (COLACE) 100  MG capsule Take 1 capsule (100 mg total) by mouth every 12 (twelve) hours.  20 capsule  0  . fluticasone (FLONASE) 50 MCG/ACT nasal spray Place 2 sprays into both nostrils daily.      . furosemide (LASIX) 20 MG tablet Take 20 mg by mouth 2 (two) times daily as needed for fluid. Takes 1 tablet everyday but can take another tablet if needed for swelling.      . haloperidol (HALDOL) 2 MG tablet Take 1 tablet (2 mg total) by mouth at bedtime.  30 tablet  2  . pantoprazole (PROTONIX) 40 MG tablet Take 1 tablet (40 mg total) by mouth daily.  30 tablet  6  . polyethylene glycol (MIRALAX / GLYCOLAX) packet Take 17 g by mouth daily as needed (constipation).      . potassium chloride (KLOR-CON 10) 10 MEQ tablet Take 1 tablet (10 mEq total) by mouth daily.  90 tablet  1  .  [DISCONTINUED] oxybutynin (DITROPAN-XL) 5 MG 24 hr tablet Take 1 tablet (5 mg total) by mouth at bedtime.  30 tablet  6   No current facility-administered medications on file prior to visit.   Social, surgical, family history reviewed with patient and updated in appropriate chart locations.   Review of Systems  History obtained from chart review and the patient General ROS: negative for - chills, fever, malaise or weight gain Cardiovascular ROS: no chest pain or dyspnea on exertion Gastrointestinal ROS: no abdominal pain, change in bowel habits, or black or bloody stools Musculoskeletal ROS: positive for - joint pain and joint stiffness negative for - gait disturbance, joint swelling, muscle pain or muscular weakness     Objective:   Physical Exam Filed Vitals:   04/16/14 1318  BP: 126/66  Pulse: 66  Temp: 97.3 F (36.3 C)   General: sitting in chair, NAD HEENT: PERRL, EOMI, no scleral icterus Cardiac: RRR, no rubs, murmurs or gallops Pulm: clear to auscultation bilaterally, moving normal volumes of air Abd: soft, nontender, nondistended, BS present Ext: warm and well perfused, no pedal edema, knee crepitus bilaterally, palpable left knee spur, 5/5 LE strength, normal sensation Neuro: alert and oriented X3, cranial nerves II-XII grossly intact    Assessment & Plan:  Please see problem oriented charting  Pt discussed with Dr. Beryle Beams

## 2014-04-16 NOTE — Assessment & Plan Note (Signed)
The patient's knee pain is very typical of osteoarthritis improving with movement and being controlled with tramadol at this time. The patient has a primary orthopedist who performed her bilateral hip replacements Dr. Lorin Mercy who has also evaluated the knee and felt that in your present was not an option given the hip replacement. The patient has been trying as also revealed a chart review to obtain a knee brace but has not yet been able to have one. -Refill of tramadol -Can consider sports medicine referral or back to orthopedist for possible intra-articular corticosteroid injections -Left knee brace prescription handed to the patient

## 2014-04-16 NOTE — Patient Instructions (Signed)
General Instructions:   Thank you for bringing your medicines today. This helps [place] keep you safe from mistakes.   Progress Toward Treatment Goals:  Treatment Goal 08/28/2013  Blood pressure at goal  Stop smoking -  Prevent falls unchanged    Self Care Goals & Plans:  Self Care Goal 04/16/2014  Manage my medications take my medicines as prescribed; bring my medications to every visit  Monitor my health -  Eat healthy foods eat more vegetables; drink diet soda or water instead of juice or soda; eat foods that are low in salt; eat baked foods instead of fried foods  Be physically active find an activity I enjoy    No flowsheet data found.   Care Management & Community Referrals:  Referral 03/14/2013  Referrals made for care management support none needed

## 2014-04-16 NOTE — Progress Notes (Signed)
Rx for Tramadol per Dr Algis Liming called in to pharmacy. Hilda Blades Akemi Overholser RN 04/16/14 2:14PM

## 2014-04-16 NOTE — Progress Notes (Signed)
Attending physician note: Presenting problems, physical findings, medications, and medical history, reviewed with resident physician Dr. Clinton Gallant and I concur with her management. Murriel Hopper, M.D., [place]

## 2014-04-23 ENCOUNTER — Ambulatory Visit (HOSPITAL_COMMUNITY): Payer: Self-pay | Admitting: Psychiatry

## 2014-04-29 ENCOUNTER — Other Ambulatory Visit (HOSPITAL_COMMUNITY): Payer: Self-pay | Admitting: Psychiatry

## 2014-04-29 ENCOUNTER — Other Ambulatory Visit: Payer: Self-pay | Admitting: Internal Medicine

## 2014-04-29 DIAGNOSIS — Z8639 Personal history of other endocrine, nutritional and metabolic disease: Secondary | ICD-10-CM

## 2014-04-29 DIAGNOSIS — F200 Paranoid schizophrenia: Secondary | ICD-10-CM

## 2014-04-30 NOTE — Telephone Encounter (Signed)
The patient needs an appointment for labs - likely this month. I have put in for BMP (mainly to check potassium since she is on Cozaar and Potassium pills both). I will send this to front desk as well so the patient can be informed.

## 2014-04-30 NOTE — Telephone Encounter (Signed)
Message sent to front desk pool regarding appt for lab.

## 2014-05-07 ENCOUNTER — Ambulatory Visit
Admission: RE | Admit: 2014-05-07 | Discharge: 2014-05-07 | Disposition: A | Discharge status: Home / Self Care | Payer: Medicare Other | Source: Ambulatory Visit

## 2014-05-07 ENCOUNTER — Encounter (INDEPENDENT_AMBULATORY_CARE_PROVIDER_SITE_OTHER): Payer: Self-pay

## 2014-05-07 DIAGNOSIS — Z1231 Encounter for screening mammogram for malignant neoplasm of breast: Secondary | ICD-10-CM

## 2014-05-13 ENCOUNTER — Other Ambulatory Visit: Payer: Self-pay | Admitting: *Deleted

## 2014-05-13 DIAGNOSIS — M1990 Unspecified osteoarthritis, unspecified site: Secondary | ICD-10-CM

## 2014-05-13 DIAGNOSIS — Z760 Encounter for issue of repeat prescription: Secondary | ICD-10-CM

## 2014-05-13 MED ORDER — TRAMADOL HCL 50 MG PO TABS: 50.0000 mg | ORAL_TABLET | Freq: Four times a day (QID) | ORAL | Status: DC | PRN

## 2014-05-13 NOTE — Telephone Encounter (Signed)
Last time Rx was written the directions changed to - 1 tab every 6 hours.  Pt has taken med that way and feels better.   She is asking for refill and increase meds from 60 a month to 120. I told her I would present to you.

## 2014-05-13 NOTE — Telephone Encounter (Signed)
The patient should get a signed contract now that she is taking tramadol regularly. Please schedule a visit.

## 2014-05-14 NOTE — Telephone Encounter (Signed)
Rx called in and message to front desk to schedule appointment

## 2014-05-19 ENCOUNTER — Other Ambulatory Visit (INDEPENDENT_AMBULATORY_CARE_PROVIDER_SITE_OTHER): Payer: Medicare Other

## 2014-05-19 DIAGNOSIS — Z8639 Personal history of other endocrine, nutritional and metabolic disease: Secondary | ICD-10-CM

## 2014-05-19 DIAGNOSIS — Z862 Personal history of diseases of the blood and blood-forming organs and certain disorders involving the immune mechanism: Secondary | ICD-10-CM

## 2014-05-19 LAB — BASIC METABOLIC PANEL WITH GFR
BUN: 16 mg/dL (ref 6–23)
CO2: 26 mEq/L (ref 19–32)
Calcium: 9 mg/dL (ref 8.4–10.5)
Chloride: 105 mEq/L (ref 96–112)
Creat: 1.34 mg/dL — ABNORMAL HIGH (ref 0.50–1.10)
GFR, Est African American: 46 mL/min — ABNORMAL LOW
GFR, Est Non African American: 40 mL/min — ABNORMAL LOW
Glucose, Bld: 80 mg/dL (ref 70–99)
Potassium: 3.6 mEq/L (ref 3.5–5.3)
Sodium: 140 mEq/L (ref 135–145)

## 2014-05-21 ENCOUNTER — Ambulatory Visit (HOSPITAL_COMMUNITY): Payer: Self-pay | Admitting: Psychiatry

## 2014-05-22 ENCOUNTER — Telehealth: Payer: Self-pay | Admitting: Licensed Clinical Social Worker

## 2014-05-22 NOTE — Telephone Encounter (Signed)
CSW received return call from [name], SW for [facility].  [name] confirmed pt's scheduled transportation day is Thursday.  If pt should need transportation on another day, pt aware she can contact [facility].  CSW attempted to contact Ms. Hanley to request [facility] transport to be scheduled for pt's PCP appointment in Sept unable to leave message.

## 2014-05-25 NOTE — Telephone Encounter (Signed)
CSW placed call to Ms. Lantier to inquire if pt could utilize [facility] for her 07/07/14 appt with PCP.  Pt states she could however, [facility] transports would make it an "all day event and I am ill".  Pt states [facility] picks up and hour and a half prior to appointment and an hour and a half following appt.  Pt states she prefers Thursdays appointment utilizing her DSS transport person.

## 2014-06-24 ENCOUNTER — Other Ambulatory Visit (HOSPITAL_COMMUNITY): Payer: Self-pay | Admitting: Psychiatry

## 2014-06-24 ENCOUNTER — Other Ambulatory Visit: Payer: Self-pay | Admitting: Internal Medicine

## 2014-06-25 NOTE — Telephone Encounter (Signed)
I have never seen her. She has no pain contract. Needs to see me or [facility] for refill.

## 2014-06-28 ENCOUNTER — Other Ambulatory Visit (HOSPITAL_COMMUNITY): Payer: Self-pay | Admitting: Psychiatry

## 2014-07-03 ENCOUNTER — Other Ambulatory Visit: Payer: Self-pay | Admitting: Internal Medicine

## 2014-07-03 NOTE — Telephone Encounter (Signed)
Too early for both of these. Should have refills,.

## 2014-07-07 ENCOUNTER — Ambulatory Visit: Payer: Self-pay | Admitting: Internal Medicine

## 2014-07-09 ENCOUNTER — Encounter: Payer: Self-pay | Admitting: Internal Medicine

## 2014-07-09 ENCOUNTER — Ambulatory Visit (INDEPENDENT_AMBULATORY_CARE_PROVIDER_SITE_OTHER): Payer: Medicare HMO | Admitting: Internal Medicine

## 2014-07-09 VITALS — BP 119/48 | HR 72 | Temp 98.4°F | Ht 63.0 in | Wt 207.7 lb

## 2014-07-09 DIAGNOSIS — Z760 Encounter for issue of repeat prescription: Secondary | ICD-10-CM

## 2014-07-09 DIAGNOSIS — M545 Low back pain, unspecified: Secondary | ICD-10-CM

## 2014-07-09 DIAGNOSIS — F200 Paranoid schizophrenia: Secondary | ICD-10-CM

## 2014-07-09 DIAGNOSIS — M1990 Unspecified osteoarthritis, unspecified site: Secondary | ICD-10-CM

## 2014-07-09 DIAGNOSIS — Z8673 Personal history of transient ischemic attack (TIA), and cerebral infarction without residual deficits: Secondary | ICD-10-CM

## 2014-07-09 DIAGNOSIS — E785 Hyperlipidemia, unspecified: Secondary | ICD-10-CM

## 2014-07-09 DIAGNOSIS — F329 Major depressive disorder, single episode, unspecified: Secondary | ICD-10-CM

## 2014-07-09 DIAGNOSIS — F3289 Other specified depressive episodes: Secondary | ICD-10-CM

## 2014-07-09 DIAGNOSIS — I10 Essential (primary) hypertension: Secondary | ICD-10-CM

## 2014-07-09 MED ORDER — TRAMADOL HCL 50 MG PO TABS: 50.0000 mg | ORAL_TABLET | Freq: Four times a day (QID) | ORAL | Status: DC | PRN

## 2014-07-09 MED ORDER — BUPROPION HCL ER (XL) 300 MG PO TB24: ORAL_TABLET | ORAL | Status: DC

## 2014-07-09 NOTE — Progress Notes (Signed)
Tramadol rx faxed to [facility].

## 2014-07-09 NOTE — Assessment & Plan Note (Signed)
Assessment: Patient has chronic osteoarthritic pain in her left knee that is well controlled with Tramadol 50 mg Q6H prn pain which she only takes 1-2 per day. Patient recently acquired a knee brace 2 weeks ago which has been helping. She is requesting refill today on Tramadol (last refill was 2 months ago).   Plan: I will refill Tramadol prescription and encouraged patient to use her new knee brace as much as possible.

## 2014-07-09 NOTE — Progress Notes (Signed)
Patient ID: [name], female   DOB: 03-Jun-1941, 73 y.o.   MRN: [id number]  Internal Medicine Clinic Attending Date of visit: 07/09/2014  I saw and evaluated the patient.  I personally confirmed the key portions of the history and exam documented by Dr. Marvel Plan and I reviewed pertinent patient test results.  The assessment, diagnosis, and plan were formulated together and I agree with the documentation in the resident's note.

## 2014-07-09 NOTE — Assessment & Plan Note (Signed)
Patient may be on Plavix therapy if she had a TIA on ASA therapy. Unfortunately, there are not easily accessible records indicating this. Patient did not follow up with Neurology after her questionable TIA in 2002. We will need to do more Epic research on this to see the if the patient does require Plavix therapy or not. We have continued her plavix and asa 81 mg for now.

## 2014-07-09 NOTE — Progress Notes (Signed)
   Subjective:    Patient ID: [name], female    DOB: May 26, 1941, 73 y.o.   MRN: [id number]  HPI [name] is a 73 yo female with PMHx of HTN, hypothyroidism, GERD, HFpEF, OA and h/o bilateral hip replacement who presents to the clinic for refill on her Tramadol and Wellbutrin.  Patient takes Tramadol 50 mg Q6H prn pain. She states she only takes about 1-2 a day. She previously prescribed 60 about two months ago and it lasted until just a couple weeks ago. She hasn't been sleeping well due to the pain. She was able to get a knee brace 2 weeks ago as well which has been helping.   Patient has been on Plavix 75 mg daily since her ?TIA in 2002. Patient was on ASA 81 mg prior to the TIA. She was placed on Plavix 75 mg in addition to ASA 81 mg daily. She never followed up with Neurology. Patient is questioning today whether or not she needs to be on Plavix.    Review of Systems General: Denies fever, chills, fatigue, change in appetite and diaphoresis.  Respiratory: Denies SOB, cough, DOE, chest tightness, and wheezing.   Cardiovascular: Denies chest pain and palpitations.  Gastrointestinal: Denies nausea, vomiting, abdominal pain, diarrhea, constipation, blood in stool and abdominal distention.  Genitourinary: Denies dysuria, urgency, frequency, hematuria, suprapubic pain and flank pain. Endocrine: Denies hot or cold intolerance, polyuria, and polydipsia. Musculoskeletal: Admits to joint pain. Denies myalgias, joint swelling, and gait problem.  Skin: Denies pallor, rash and wounds.  Neurological: Denies dizziness, headaches, weakness, lightheadedness, numbness,seizures, and syncope, Psychiatric/Behavioral: Admits to decreased sleep. Denies mood changes, confusion, nervousness, and agitation.     Objective:   Physical Exam Filed Vitals:   07/09/14 1330  BP: 119/48  Pulse: 72  Temp: 98.4 F (36.9 C)  TempSrc: Oral  Height: 5\' 3"  (1.6 m)  Weight: 207 lb 11.2 oz (94.212 kg)    SpO2: 100%   General: Vital signs reviewed.  Patient is well-developed and well-nourished, in no acute distress and cooperative with exam.   Cardiovascular: RRR, S1 normal, S2 normal, no murmurs, gallops, or rubs. Pulmonary/Chest: Clear to auscultation bilaterally, no wheezes, rales, or rhonchi. Abdominal: Soft, non-tender, non-distended, BS +, no masses, organomegaly, or guarding present.  Musculoskeletal: No joint deformities, erythema, or stiffness, ROM full and nontender. Extremities: No lower extremity edema bilaterally,  pulses symmetric and intact bilaterally. No cyanosis or clubbing. Skin: Warm, dry and intact. No rashes or erythema. Psychiatric: Normal mood and affect. speech and behavior is normal. Cognition and memory are normal.      Assessment & Plan:   Please see problem based assessment and plan.

## 2014-07-09 NOTE — Patient Instructions (Signed)
General Instructions:   Thank you for bringing your medicines today. This helps [place] keep you safe from mistakes.   Progress Toward Treatment Goals:  Treatment Goal 08/28/2013  Blood pressure at goal  Stop smoking -  Prevent falls unchanged    Self Care Goals & Plans:  Self Care Goal 07/09/2014  Manage my medications bring my medications to every visit; refill my medications on time; take my medicines as prescribed  Monitor my health -  Eat healthy foods eat more vegetables; eat foods that are low in salt; eat baked foods instead of fried foods  Be physically active -  Prevent falls have my vision checked; wear appropriate shoes    No flowsheet data found.   Care Management & Community Referrals:  Referral 03/14/2013  Referrals made for care management support none needed

## 2014-07-09 NOTE — Assessment & Plan Note (Signed)
Assessment: Patient is currently on atorvastatin 80 mg daily. Last lipid profile showed:   Ref. Range 06/04/2012 14:02  Cholesterol Latest Range: 0-200 mg/dL 135  Triglycerides Latest Range: <150 mg/dL 122  HDL Latest Range: >39 mg/dL 35 (L)  LDL (calc) Latest Range: 0-99 mg/dL 76  VLDL Latest Range: 0-40 mg/dL 24  Total CHOL/HDL Ratio No range found 3.9   Plan: Consider decreasing atorvastatin from 80 mg to 40 mg and recheck lipid profile at next PCP visit so patient is not on such a large dose.

## 2014-07-30 ENCOUNTER — Ambulatory Visit (INDEPENDENT_AMBULATORY_CARE_PROVIDER_SITE_OTHER): Payer: Medicare HMO | Admitting: Psychiatry

## 2014-07-30 ENCOUNTER — Other Ambulatory Visit: Payer: Self-pay | Admitting: Internal Medicine

## 2014-07-30 ENCOUNTER — Encounter (HOSPITAL_COMMUNITY): Payer: Self-pay | Admitting: Psychiatry

## 2014-07-30 VITALS — BP 128/74 | HR 77 | Ht 63.0 in | Wt 203.8 lb

## 2014-07-30 DIAGNOSIS — F200 Paranoid schizophrenia: Secondary | ICD-10-CM

## 2014-07-30 DIAGNOSIS — F29 Unspecified psychosis not due to a substance or known physiological condition: Secondary | ICD-10-CM

## 2014-07-30 MED ORDER — HALOPERIDOL 1 MG PO TABS: 1.0000 mg | ORAL_TABLET | Freq: Every day | ORAL | Status: DC

## 2014-07-30 MED ORDER — BUPROPION HCL ER (XL) 300 MG PO TB24: 300.0000 mg | ORAL_TABLET | Freq: Every day | ORAL | Status: DC

## 2014-07-30 NOTE — Progress Notes (Signed)
[facility] Progress Note  [name] [id number] 72 y.o.  07/30/2014 2:44 PM  Chief Complaint: I cannot sleep.  I'm out of Haldol.   I'm feeling paranoid.    History of Present Illness: [name] came for her followup appointment.  She was last seen in April .  She missed the appointment because of eye surgery and lack of transportation.  She has been out of Haldol for more than 2 months.  She admitted increased paranoia, poor sleep, racing thoughts and irritability.  She is taking Wellbutrin which was prescribed by her primary care physician . SShe denies any crying spells or any depressive thoughts but admitted paranoid and not comfortable around people.  She is glad that her daughter moved out however she is very involved in her treatment and followup with the doctors.  She recently seen her primary care physician.  She had blood work and she has high creatinine .  Patient denies any tremors or shakes however she has oral facial movements .  She has difficulty walking because of unbalance gait .  She uses walker .  She is a poor historian and she has difficulty organizing her thoughts.  She admitted having trust issues and sometimes he does not leave her house unless it is important.  Her daughter change the locks because she was not sure about the neighbors.  However patient denies any recent aggression or violence.  She denies any suicidal thoughts or homicidal thoughts.  Her appetite is fair.  Her vitals are stable.  She endorsed some time visual hallucinations and seeing images.  Patient lives by herself.  She is not seeing any therapist at this time.  On her last visit we increased her Haldol because she was complaining of paranoia.  Patient did respond well on Haldol however I am concerned about fascia oral movements.  Her  Suicidal Ideation: No Plan Formed: No Patient has means to carry out plan: No  Homicidal Ideation: No Plan Formed: No Patient has means to carry out  plan: No  Review of Systems: Psychiatric: Agitation: Irritability Hallucination: Yes Depressed Mood: No Insomnia: Yes Hypersomnia: No Altered Concentration: No Feels Worthless: No Grandiose Ideas: No Belief In Special Powers: No New/Increased Substance Abuse: No Compulsions: No  Neurologic: Headache: No Seizure: No Paresthesias: No  Past Medical Family, Social History:  Patient was born and raised in [place]. She had 2 sons and one daughter.  Both her sons are in jail. Patient had another son who committed suicide in 05/07/2012 .  Patient husband died many years ago.  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  His brother died in a mental health hospital in [place].  Patient has multiple health problems.  She has GERD, gout, hypertension, degenerative joint disease, osteoarthritis, TIA and bilateral hearing loss.  She has history of mitral valve prolapse, chronic venous insufficiency, diverticulitis hyperlipidemia obesity and seasonal allergies. [facility] medical needs.    Outpatient Encounter Prescriptions as of 07/30/2014  Medication Sig  . albuterol (PROVENTIL HFA;VENTOLIN HFA) 108 (90 BASE) MCG/ACT inhaler Inhale 2 puffs into the lungs every 6 (six) hours as needed for wheezing or shortness of breath.  . allopurinol (ZYLOPRIM) 300 MG tablet Take 1 tablet (300 mg total) by mouth daily.  [name] amLODipine (NORVASC) 5 MG tablet TAKE 2 TABLETS BY MOUTH EVERY DAY  .  aspirin EC 81 MG tablet Take 81 mg by mouth daily.  [name] atorvastatin (LIPITOR) 80 MG tablet Take 1 tablet (80 mg total) by mouth daily.  . Biotin (BIOTIN 5000) 5 MG CAPS Take 1 capsule by mouth daily.    [name] buPROPion (WELLBUTRIN XL) 300 MG 24 hr tablet Take 1 tablet (300 mg total) by mouth daily.  . Calcium Carbonate-Vitamin D (CALCIUM-VITAMIN D) 500-200 MG-UNIT per tablet  Take 1 tablet by mouth daily.    . clopidogrel (PLAVIX) 75 MG tablet Take 1 tablet (75 mg total) by mouth daily.  [name] conjugated estrogens (PREMARIN) vaginal cream Place vaginally 2 (two) times a week.  . docusate sodium (COLACE) 100 MG capsule Take 1 capsule (100 mg total) by mouth every 12 (twelve) hours.  . fluocinonide cream (LIDEX) 0.05 % APPLY TO AFFECTED AREA TWICE DAILY  . fluticasone (FLONASE) 50 MCG/ACT nasal spray INHALE 2 SPRAYS IN EACH NOSTRIL ONCE DAILY  . furosemide (LASIX) 20 MG tablet TAKE 1 TABLET (20 MG TOTAL) BY MOUTH DAILY. OKAY TO USE ONE MORE TABLET DAILY FOR INCREASED SWELLING AS NEEDED.  . haloperidol (HALDOL) 1 MG tablet Take 1 tablet (1 mg total) by mouth at bedtime.  [name] losartan (COZAAR) 100 MG tablet TAKE 1 TABLET BY MOUTH DAILY  . oxybutynin (DITROPAN-XL) 5 MG 24 hr tablet Take 1 tablet (5 mg total) by mouth at bedtime.  . pantoprazole (PROTONIX) 40 MG tablet TAKE 1 TABLET BY MOUTH DAILY  . polyethylene glycol (MIRALAX / GLYCOLAX) packet Take 17 g by mouth daily as needed (constipation).  . potassium chloride (MICRO-K) 10 MEQ CR capsule TAKE 1 CAPSULE BY MOUTH DAILY  . traMADol (ULTRAM) 50 MG tablet Take 1 tablet (50 mg total) by mouth every 6 (six) hours as needed. For pain  . [DISCONTINUED] buPROPion (WELLBUTRIN XL) 300 MG 24 hr tablet TAKE 1 TABLET BY MOUTH EVERY MORNING  . [DISCONTINUED] diphenhydrAMINE (SOMINEX) 25 MG tablet Take 25 mg by mouth at bedtime as needed for sleep.  . [DISCONTINUED] haloperidol (HALDOL) 2 MG tablet TAKE 1 TABLET BY MOUTH EVERY NIGHT AT BEDTIME    Past Psychiatric History/Hospitalization(s): Patient admitted history of one psychiatric hospitalization many years ago. Patient remember she was trying to jump off a window when her husband saved her. She was admitted at [facility] for 3 weeks.  She was seen by this writer in 2013 and given Haldol which helped her but patient stopped taking the medication because she was feeling better.    Anxiety: Yes Bipolar Disorder: No Depression: Yes Mania: No Psychosis: Yes Schizophrenia: Yes Personality Disorder: No Hospitalization for psychiatric illness: Yes History of Electroconvulsive Shock Therapy: No Prior Suicide Attempts: Patient tried to jump off from the window but her husband rescued her.  Physical Exam: Constitutional:  BP 128/74  Pulse 77  Ht _0  (1.6 m)  Wt 203 lb 12.8 oz (92.443 kg)  BMI 36.11 kg/m2  Recent Results (from the past 2160 hour(s))  BASIC METABOLIC PANEL WITH GFR     Status: Abnormal   Collection Time    05/19/14 11:36 AM      Result Value Ref Range   Sodium 140  135 - 145 mEq/L   Potassium 3.6  3.5 - 5.3 mEq/L   Chloride 105  96 - 112 mEq/L   CO2 26  19 - 32 mEq/L   Glucose, Bld 80  70 - 99 mg/dL   BUN 16  6 - 23 mg/dL   Creat 1.34 (*) 0.50 -  1.10 mg/dL   Calcium 9.0  8.4 - 10.5 mg/dL   GFR, Est African American 46 (*)    GFR, Est Non African American 40 (*)    Comment:       The estimated GFR is a calculation valid for adults (>=76 years old)     that uses the CKD-EPI algorithm to adjust for age and sex. It is       not to be used for children, pregnant women, hospitalized patients,        patients on dialysis, or with rapidly changing kidney function.     According to the NKDEP, eGFR >89 is normal, 60-89 shows mild     impairment, 30-59 shows moderate impairment, 15-29 shows severe     impairment and <15 is ESRD.          General Appearance: obese and Casually dressed.  She has difficulty walking.  Musculoskeletal: Strength & Muscle Tone: Patient uses walker for ambulation.  She has chronic pain. Gait & Station: unsteady Patient leans: Front and Backward  Mental status examination Patient is casually dressed and fairly groomed.  She maintained fair eye contact.  She is a poor historian.  She describes her mood anxious and her affect is labile.  Her speech is rambling and fast.  Her thought process is circumstantial and she  has difficulty organizing her thinking.  She endorsed paranoia and hallucination.  She has flight of ideas.  Her attention and concentration is distracted.  She denies any active or passive suicidal thoughts or homicidal thoughts.  Her fund of knowledge is below average.  She is alert and oriented x3.  Her insight judgment and impulse control is okay.  Established Problem, Stable/Improving (1), Review of Psycho-Social Stressors (1), Review or order clinical lab tests (1), Review and summation of old records (2), Established Problem, Worsening (2), Review of Last Therapy Session (1), Review of Medication Regimen & Side Effects (2) and Review of New Medication or Change in Dosage (2)  Assessment: Axis I:  psychosis NOS, rule out paranoid schizophrenia, rule out major depressive disorder with psychosis  Axis II: deferred  Axis III:  see medical history  Axis IV: moderate  Axis V:  V. 50-55   Plan:   I reviewed blood work results and notes from primary care physician.  Patient is slowly decompensating because of noncompliance with Haldol.  I will start Haldol 1 mg at bedtime.  The patient has facial oral movements could be due to Haldol.  However she does not want to change in medication.  I will continue Wellbutrin XL 300 mg daily.  Patient used to see Kinnie Scales for counseling.  Recommended to start counseling for coping and social skills.  Discussed in detail the risks and benefits of medication especially metabolic syndrome, EPS and sedation with psychotropic medication.  I will see her again in 3 months. Time spend 25 minutes.  More than 50% of the time spent in psychoeducation, counseling and coordination of care.  Discuss safety plan that anytime having active suicidal thoughts or homicidal thoughts then patient need to call 911 or go to the local emergency room.  Shelby Barbary T., MD 07/30/2014

## 2014-07-31 NOTE — Telephone Encounter (Signed)
Called to pharm 

## 2014-08-11 ENCOUNTER — Encounter: Payer: Self-pay | Admitting: Internal Medicine

## 2014-08-11 DIAGNOSIS — J45909 Unspecified asthma, uncomplicated: Secondary | ICD-10-CM | POA: Insufficient documentation

## 2014-08-11 DIAGNOSIS — Z859 Personal history of malignant neoplasm, unspecified: Secondary | ICD-10-CM | POA: Insufficient documentation

## 2014-08-11 DIAGNOSIS — G4733 Obstructive sleep apnea (adult) (pediatric): Secondary | ICD-10-CM | POA: Insufficient documentation

## 2014-08-13 ENCOUNTER — Ambulatory Visit (INDEPENDENT_AMBULATORY_CARE_PROVIDER_SITE_OTHER): Payer: Medicare HMO | Admitting: Internal Medicine

## 2014-08-13 ENCOUNTER — Encounter: Payer: Self-pay | Admitting: Internal Medicine

## 2014-08-13 VITALS — BP 112/58 | HR 68 | Temp 98.1°F | Ht 63.0 in | Wt 204.4 lb

## 2014-08-13 DIAGNOSIS — R799 Abnormal finding of blood chemistry, unspecified: Secondary | ICD-10-CM

## 2014-08-13 DIAGNOSIS — I10 Essential (primary) hypertension: Secondary | ICD-10-CM

## 2014-08-13 DIAGNOSIS — M109 Gout, unspecified: Secondary | ICD-10-CM

## 2014-08-13 DIAGNOSIS — J309 Allergic rhinitis, unspecified: Secondary | ICD-10-CM

## 2014-08-13 DIAGNOSIS — G4733 Obstructive sleep apnea (adult) (pediatric): Secondary | ICD-10-CM

## 2014-08-13 DIAGNOSIS — Z0000 Encounter for general adult medical examination without abnormal findings: Secondary | ICD-10-CM

## 2014-08-13 DIAGNOSIS — N952 Postmenopausal atrophic vaginitis: Secondary | ICD-10-CM

## 2014-08-13 DIAGNOSIS — R296 Repeated falls: Secondary | ICD-10-CM

## 2014-08-13 DIAGNOSIS — N183 Chronic kidney disease, stage 3 unspecified: Secondary | ICD-10-CM

## 2014-08-13 DIAGNOSIS — J4520 Mild intermittent asthma, uncomplicated: Secondary | ICD-10-CM

## 2014-08-13 DIAGNOSIS — Z8673 Personal history of transient ischemic attack (TIA), and cerebral infarction without residual deficits: Secondary | ICD-10-CM

## 2014-08-13 DIAGNOSIS — G894 Chronic pain syndrome: Secondary | ICD-10-CM

## 2014-08-13 DIAGNOSIS — D649 Anemia, unspecified: Secondary | ICD-10-CM

## 2014-08-13 DIAGNOSIS — F209 Schizophrenia, unspecified: Secondary | ICD-10-CM

## 2014-08-13 DIAGNOSIS — I5032 Chronic diastolic (congestive) heart failure: Secondary | ICD-10-CM

## 2014-08-13 DIAGNOSIS — E669 Obesity, unspecified: Secondary | ICD-10-CM

## 2014-08-13 DIAGNOSIS — E785 Hyperlipidemia, unspecified: Secondary | ICD-10-CM

## 2014-08-13 LAB — ANEMIA PANEL
%SAT: 16 % — ABNORMAL LOW (ref 20–55)
ABS Retic: 57 10*3/uL (ref 19.0–186.0)
Ferritin: 82 ng/mL (ref 10–291)
Folate: >20 ng/mL
Iron: 44 ug/dL (ref 42–145)
RBC.: 3.35 MIL/uL — ABNORMAL LOW (ref 3.87–5.11)
Retic Ct Pct: 1.7 % (ref 0.4–2.3)
TIBC: 271 ug/dL (ref 250–470)
UIBC: 227 ug/dL (ref 125–400)
Vitamin B-12: 1014 pg/mL — ABNORMAL HIGH (ref 211–911)

## 2014-08-13 LAB — LIPID PANEL
Cholesterol: 163 mg/dL (ref 0–200)
HDL: 52 mg/dL (ref >39)
LDL Cholesterol: 92 mg/dL (ref 0–99)
Total CHOL/HDL Ratio: 3.1 Ratio
Triglycerides: 96 mg/dL (ref <150)
VLDL: 19 mg/dL (ref 0–40)

## 2014-08-13 LAB — TSH: TSH: 3.897 u[IU]/mL (ref 0.350–4.500)

## 2014-08-13 NOTE — Progress Notes (Signed)
   Subjective:    Patient ID: [name], female    DOB: 1941-03-26, 73 y.o.   MRN: [id number]  HPI  Please see the A&P for the status of the pt's chronic medical problems.  Review of Systems  Constitutional: Negative for unexpected weight change.  HENT: Positive for ear pain.   Respiratory: Negative for shortness of breath.   Cardiovascular: Negative for chest pain.       Objective:   Physical Exam  Constitutional: She is oriented to person, place, and time. She appears well-developed and well-nourished. No distress.  HENT:  Head: Normocephalic and atraumatic.  Right Ear: External ear normal.  Left Ear: External ear normal.  Nose: Nose normal.  L TM nl. Canal some wax. No tenderness over L TMJ but some crepitus when first starts to move jaw  Eyes: Conjunctivae and EOM are normal.  Cardiovascular: Normal rate, regular rhythm and normal heart sounds.   Pulmonary/Chest: Effort normal and breath sounds normal. No respiratory distress.  Neurological: She is alert and oriented to person, place, and time.  Skin: Skin is warm. She is not diaphoretic.  Psychiatric: She has a normal mood and affect. Her behavior is normal. Judgment and thought content normal.          Assessment & Plan:

## 2014-08-13 NOTE — Assessment & Plan Note (Signed)
I am rechecking anemia panel today.

## 2014-08-13 NOTE — Patient Instructions (Signed)
1. See me in 2 months for blood pressure check. 2. Stop the plavix 3. Stop the premarin cream and see what happens 4. Stop the Vitamin C - it is in your multivitamin 5. If you get dizzy, lightheaded, let me know 6 I am sending you to neurorehab 7. i will mail you your test results

## 2014-08-13 NOTE — Assessment & Plan Note (Signed)
Cr basically stable since 2008. We reviewed curve and I stressed if it stays at current level, will never need HD.

## 2014-08-13 NOTE — Assessment & Plan Note (Signed)
On lasix 20. No pul edema.

## 2014-08-13 NOTE — Assessment & Plan Note (Signed)
Sees mental health at [facility]. On Haldol and Wellbutrin.

## 2014-08-13 NOTE — Assessment & Plan Note (Signed)
Lipid has not been checked for yrs so checking today. On Lipitor 80 and will cont for now. No DM, CAD, CVA so goal not 100 - would be 160.

## 2014-08-13 NOTE — Assessment & Plan Note (Signed)
Uses Tramadol BID. Only occassionally uses a third pill. Helps with hip pain.  Also c/o 1.5 months L sided ear pain. Really at L [facility] joint. Recently started sucking her teeth / gums - can't wear lower dentures. I think her pain is all related to overuse of the TMJ.

## 2014-08-14 NOTE — Assessment & Plan Note (Signed)
She is an ex smoker. She has alb. Pul exam is OK today. PFT's recorded in overview.

## 2014-08-14 NOTE — Assessment & Plan Note (Signed)
The pt and her daughter thinks she is on too many meds. I had reviewed her h/o TIA - 04/2001 was in hospital and had R sided numbness and facial weakness and MRI / MRA negative. Plavix was added and sch F/U to address it but never addressed. She is at risk of CVA - ex smoker, HTN (well controlled), and HLD (LDL 76). She is having easy bruising. I think the risks of cont the dual anti-plt outweighs the benefits and therefore, will stop plavix. Will remove TIA from Problem list.

## 2014-08-14 NOTE — Assessment & Plan Note (Signed)
I could not find a crystal dx. She describes podagra. She is on allopurinol. Would need to check uric acid level in future.

## 2014-08-14 NOTE — Assessment & Plan Note (Signed)
She used CPAP but states after 4 hour the mask came off bc she rolled over and hasn't been using it for some time. Since mild OSA and there are more important issues, will leave for now.

## 2014-08-14 NOTE — Assessment & Plan Note (Signed)
She has premarin cream and was told it was to decrease her hot flashes. She no longer has hot flushes. I am not aware of the efficacy of topical estrogen for menopausal hot flashes. She wants to get off meds and doesn't like using the vaginal cream. Therefore, to decrease to once a week then stop.

## 2014-08-14 NOTE — Assessment & Plan Note (Signed)
We reviewed her weight curve.

## 2014-08-14 NOTE — Assessment & Plan Note (Signed)
On nasonex and OTC cetrizine

## 2014-08-14 NOTE — Assessment & Plan Note (Signed)
BP Readings from Last 3 Encounters:  08/13/14 112/58  07/30/14 128/74  07/09/14 119/48   BP is well controlled. No dizziness / lightheadedness but states falls a lot. If cont to fall, might decrease meds.

## 2014-08-14 NOTE — Assessment & Plan Note (Signed)
She is falling freq. Wil refer to neurorehab.  She has shower bench but desires a stool. Order DME.  R upper arm pain - muscular with point tenderness inner upper arm. Bruise present but pain longer (1-2 months). Follow.   Stop Vit C as in her MVI.

## 2014-08-17 ENCOUNTER — Other Ambulatory Visit: Payer: Self-pay | Admitting: *Deleted

## 2014-08-17 DIAGNOSIS — Z760 Encounter for issue of repeat prescription: Secondary | ICD-10-CM

## 2014-08-17 NOTE — Telephone Encounter (Signed)
Letter from [facility] requesting 90 supply. Thanks

## 2014-08-18 ENCOUNTER — Encounter: Payer: Self-pay | Admitting: Internal Medicine

## 2014-08-18 MED ORDER — ATORVASTATIN CALCIUM 80 MG PO TABS: 80.0000 mg | ORAL_TABLET | Freq: Every day | ORAL | Status: DC

## 2014-09-01 ENCOUNTER — Other Ambulatory Visit: Payer: Self-pay | Admitting: Internal Medicine

## 2014-09-02 NOTE — Telephone Encounter (Signed)
Sent to [facility]. Called to pharm

## 2014-09-02 NOTE — Telephone Encounter (Signed)
She needs to make Dec / Jan appt with me. I will not fill without seeing her by Jan

## 2014-09-03 ENCOUNTER — Other Ambulatory Visit: Payer: Self-pay | Admitting: Internal Medicine

## 2014-09-03 DIAGNOSIS — Z760 Encounter for issue of repeat prescription: Secondary | ICD-10-CM

## 2014-09-03 MED ORDER — ATORVASTATIN CALCIUM 40 MG PO TABS
40.0000 mg | ORAL_TABLET | Freq: Every day | ORAL | Status: DC
Start: 2014-09-03 — End: 2015-07-27

## 2014-09-08 ENCOUNTER — Encounter: Payer: Self-pay | Admitting: *Deleted

## 2014-09-22 ENCOUNTER — Other Ambulatory Visit: Payer: Self-pay | Admitting: Internal Medicine

## 2014-09-28 ENCOUNTER — Other Ambulatory Visit: Payer: Self-pay | Admitting: Internal Medicine

## 2014-09-28 ENCOUNTER — Other Ambulatory Visit (HOSPITAL_COMMUNITY): Payer: Self-pay | Admitting: Psychiatry

## 2014-09-29 NOTE — Telephone Encounter (Signed)
3 month supply 07/2014

## 2014-09-30 ENCOUNTER — Other Ambulatory Visit (HOSPITAL_COMMUNITY): Payer: Self-pay | Admitting: Psychiatry

## 2014-10-01 ENCOUNTER — Telehealth: Payer: Self-pay | Admitting: *Deleted

## 2014-10-01 MED ORDER — TRAMADOL HCL 50 MG PO TABS: 50.0000 mg | ORAL_TABLET | Freq: Two times a day (BID) | ORAL | Status: DC | PRN

## 2014-10-01 NOTE — Telephone Encounter (Signed)
Pt called stating she is taking Tramadol for pain. Dr Luana Shu told pt to take 1 Tramadol in AM and 2 in PM. Pt only gets 45 pills a month.  She has to wait a few weeks without pain meds.   She is asking for # 90 a month.  According to EPIC she has been getting # 60 a month. I called pt back but no answer.  Will you increase the # ?  Pt # [phone]

## 2014-10-01 NOTE — Telephone Encounter (Signed)
Last note - she said BID and only occasionally takes a third pill. So would need 60 + a few extra for bad days. WIll increase to 75 per month. Will you phone in and cancel the #60 RF?  If her pain has increased she will need appt

## 2014-10-01 NOTE — Telephone Encounter (Signed)
New Rx called in and old tramadol # 61 d/c

## 2014-10-08 NOTE — Addendum Note (Signed)
Addended by: Hulan Fray on: 10/08/2014 06:09 PM   Modules accepted: Orders

## 2014-10-20 ENCOUNTER — Other Ambulatory Visit (HOSPITAL_COMMUNITY): Payer: Self-pay | Admitting: Psychiatry

## 2014-10-20 ENCOUNTER — Other Ambulatory Visit: Payer: Self-pay | Admitting: Internal Medicine

## 2014-10-20 DIAGNOSIS — I10 Essential (primary) hypertension: Secondary | ICD-10-CM

## 2014-10-22 NOTE — Telephone Encounter (Signed)
Chart reviewed. Refill appropriate. 

## 2014-10-27 ENCOUNTER — Other Ambulatory Visit: Payer: Self-pay | Admitting: Internal Medicine

## 2014-10-27 ENCOUNTER — Other Ambulatory Visit (HOSPITAL_COMMUNITY): Payer: Self-pay | Admitting: Psychiatry

## 2014-11-05 ENCOUNTER — Ambulatory Visit (INDEPENDENT_AMBULATORY_CARE_PROVIDER_SITE_OTHER): Payer: Medicare HMO | Admitting: Psychiatry

## 2014-11-05 ENCOUNTER — Encounter (HOSPITAL_COMMUNITY): Payer: Self-pay | Admitting: Psychiatry

## 2014-11-05 VITALS — BP 122/69 | HR 88 | Ht 63.0 in | Wt 214.6 lb

## 2014-11-05 DIAGNOSIS — F29 Unspecified psychosis not due to a substance or known physiological condition: Secondary | ICD-10-CM

## 2014-11-05 DIAGNOSIS — F200 Paranoid schizophrenia: Secondary | ICD-10-CM

## 2014-11-05 MED ORDER — BUPROPION HCL ER (XL) 300 MG PO TB24: 300.0000 mg | ORAL_TABLET | Freq: Every morning | ORAL | Status: DC

## 2014-11-05 MED ORDER — HALOPERIDOL 1 MG PO TABS: 1.0000 mg | ORAL_TABLET | Freq: Every day | ORAL | Status: DC

## 2014-11-05 NOTE — Progress Notes (Signed)
[facility] [phone] Progress Note  [name] [id number] 74 y.o.  11/05/2014 2:26 PM  Chief Complaint: Medication management and follow-up.    History of Present Illness: [name] came for her followup appointment.  She is taking Haldol 1 mg at bedtime and Wellbutrin XL 300 mg daily.  She is concerned about her health issues and recently seen her primary care physician because of fall and dizziness.  She was recommended neuro rehabilitation however patient has not started yet.  She likes Haldol which is helping her sleep but lately she admitted poor sleep and racing thoughts.  Her paranoia and delusions are less intense from the past.  She has no tremors or shakes however she has facial oral movements which could be due to Haldol.  In the passion she used to take 2 mg Haldol however we will defer any further adjustment because of recent dizziness and risk of fall.  Patient is a poor historian and she has difficulty organizing her thoughts.  She continues to have trust issue however she denies any homicidal thought or suicidal thoughts.  She requires a lot of help from her daughter who takes her to the doctor's appointment.  However today she came by herself.  Patient also have vision problem and she scheduled to see eye specialist but do not remember the time and date.  She lives by herself.  We have discuss to see a counselor but patient forgot to keep appointment.  Patient wants to continue Haldol and Wellbutrin.  Suicidal Ideation: No Plan Formed: No Patient has means to carry out plan: No  Homicidal Ideation: No Plan Formed: No Patient has means to carry out plan: No  Review of Systems: Psychiatric: Agitation: Irritability Hallucination: No Depressed Mood: No Insomnia: Yes Hypersomnia: No Altered Concentration: No Feels Worthless: No Grandiose Ideas: No Belief In Special Powers: No New/Increased Substance Abuse: No Compulsions: No  Neurologic: Headache: No Seizure:  No Paresthesias: No  Past Medical Family, Social History:  Patient was born and raised in [place]. She had 2 sons and one daughter.  Both her sons are in jail. Patient had another son who committed suicide in May 03, 2012 .  Patient husband died many years ago.  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  His brother died in a mental health hospital in [place].  Patient has multiple health problems.  She has GERD, gout, hypertension, degenerative joint disease, osteoarthritis, TIA and bilateral hearing loss.  She has history of mitral valve prolapse, chronic venous insufficiency, diverticulitis hyperlipidemia obesity and seasonal allergies. [facility] medical needs.    Outpatient Encounter Prescriptions as of 11/05/2014  Medication Sig  . albuterol (PROVENTIL HFA;VENTOLIN HFA) 108 (90 BASE) MCG/ACT inhaler Inhale 2 puffs into the lungs every 6 (six) hours as needed for wheezing or shortness of breath.  . allopurinol (ZYLOPRIM) 300 MG tablet TAKE 1 TABLET BY MOUTH DAILY  . amLODipine (NORVASC) 5 MG tablet TAKE 2 TABLETS BY MOUTH EVERY DAY  . aspirin EC 81 MG tablet Take 81 mg by mouth daily.  [name] atorvastatin (LIPITOR) 40 MG tablet Take 1 tablet (40 mg total) by mouth daily.  . Biotin (BIOTIN 5000) 5 MG CAPS Take 1 capsule by mouth daily.    [name] buPROPion (WELLBUTRIN XL) 300 MG 24 hr tablet Take 1 tablet (300 mg total) by mouth every morning.  [name]  Calcium Carbonate-Vitamin D (CALCIUM-VITAMIN D) 500-200 MG-UNIT per tablet Take 1 tablet by mouth daily.    [name] docusate sodium (COLACE) 100 MG capsule Take 1 capsule (100 mg total) by mouth every 12 (twelve) hours.  . fluocinonide cream (LIDEX) 0.05 % APPLY TO AFFECTED AREA TWICE DAILY  . fluticasone (FLONASE) 50 MCG/ACT nasal spray INHALE 2 SPRAYS IN EACH NOSTRIL ONCE DAILY  . furosemide (LASIX) 20 MG tablet  TAKE 1 TABLET (20 MG TOTAL) BY MOUTH DAILY. OKAY TO USE ONE MORE TABLET DAILY FOR INCREASED SWELLING AS NEEDED.  . haloperidol (HALDOL) 1 MG tablet Take 1 tablet (1 mg total) by mouth at bedtime.  [name] losartan (COZAAR) 100 MG tablet TAKE 1 TABLET BY MOUTH DAILY  . oxybutynin (DITROPAN-XL) 5 MG 24 hr tablet TAKE ONE TABLET BY MOUTH AT BEDTIME  . pantoprazole (PROTONIX) 40 MG tablet TAKE 1 TABLET BY MOUTH DAILY  . polyethylene glycol (MIRALAX / GLYCOLAX) packet MIX 17 GRAMS  (1 CAPFUL) WITH 8 OUNCES OF FLUID AND  TAKE BY MOUTH DAILY AS NEEDED  . potassium chloride (MICRO-K) 10 MEQ CR capsule Take 1 capsule (10 mEq total) by mouth daily.  [name] PREMARIN vaginal cream USE VAGINALLY 2 TIMES A WEEK AS DIRECTED  . traMADol (ULTRAM) 50 MG tablet Take 1 tablet (50 mg total) by mouth every 12 (twelve) hours as needed.  . [DISCONTINUED] buPROPion (WELLBUTRIN XL) 300 MG 24 hr tablet TAKE 1 TABLET BY MOUTH EVERY MORNING  . [DISCONTINUED] haloperidol (HALDOL) 1 MG tablet TAKE 1 TABLET BY MOUTH AT BEDTIME    Past Psychiatric History/Hospitalization(s): Patient admitted history of one psychiatric hospitalization many years ago. Patient remember she was trying to jump off a window when her husband saved her. She was admitted at [facility] for 3 weeks.  She was seen by this writer in 2013 and given Haldol which helped her but patient stopped taking the medication because she was feeling better.   Anxiety: Yes Bipolar Disorder: No Depression: Yes Mania: No Psychosis: Yes Schizophrenia: Yes Personality Disorder: No Hospitalization for psychiatric illness: Yes History of Electroconvulsive Shock Therapy: No Prior Suicide Attempts: Patient tried to jump off from the window but her husband rescued her.  Physical Exam: Constitutional:  BP 122/69 mmHg  Pulse 88  Ht 5\' 3"  (1.6 m)  Wt 214 lb 9.6 oz (97.342 kg)  BMI 38.02 kg/m2  Recent Results (from the past 2160 hour(s))  Anemia panel     Status: Abnormal    Collection Time: 08/13/14 12:30 PM  Result Value Ref Range   Retic Ct Pct 1.7 0.4 - 2.3 %   RBC. 3.35 (L) 3.87 - 5.11 MIL/uL   ABS Retic 57.0 19.0 - 186.0 K/uL   Iron 44 42 - 145 ug/dL   UIBC 227 125 - 400 ug/dL   TIBC 271 250 - 470 ug/dL   %SAT 16 (L) 20 - 55 %   Vitamin B-12 1014 (H) 211 - 911 pg/mL   Folate >20.0 ng/mL    Comment:   Reference Ranges         Deficient:       0.4 - 3.3 ng/mL         Indeterminate:   3.4 - 5.4 ng/mL         Normal:              > 5.4 ng/mL     Ferritin 82 10 - 291 ng/mL  TSH     Status: None   Collection  Time: 08/13/14 12:30 PM  Result Value Ref Range   TSH 3.897 0.350 - 4.500 uIU/mL  Lipid Profile     Status: None   Collection Time: 08/13/14 12:30 PM  Result Value Ref Range   Cholesterol 163 0 - 200 mg/dL    Comment: ATP III Classification:       < 200        mg/dL        Desirable      200 - 239     mg/dL        Borderline High      >= 240        mg/dL        High     Triglycerides 96 <150 mg/dL   HDL 52 >39 mg/dL   Total CHOL/HDL Ratio 3.1 Ratio   VLDL 19 0 - 40 mg/dL   LDL Cholesterol 92 0 - 99 mg/dL    Comment:   Total Cholesterol/HDL Ratio:CHD Risk                        Coronary Heart Disease Risk Table                                        Men       Women          1/2 Average Risk              3.4        3.3              Average Risk              5.0        4.4           2X Average Risk              9.6        7.1           3X Average Risk             23.4       11.0 Use the calculated Patient Ratio above and the CHD Risk table  to determine the patient's CHD Risk. ATP III Classification (LDL):       < 100        mg/dL         Optimal      100 - 129     mg/dL         Near or Above Optimal      130 - 159     mg/dL         Borderline High      160 - 189     mg/dL         High       > 190        mg/dL         Very High      General Appearance: obese and Casually dressed.  She has difficulty  walking.  Musculoskeletal: Strength & Muscle Tone: Patient uses walker for ambulation.  She has chronic pain. Gait & Station: unsteady Patient leans: Front and Backward  Mental status examination Patient is casually dressed and fairly groomed.  She maintained fair eye contact.  She is a poor historian.  She describes her mood anxious and her  affect is labile.  Her speech is rambling and fast.  Her thought process is circumstantial and she has difficulty organizing her thinking.  She endorsed paranoia and hallucination.  She has flight of ideas.  Her attention and concentration is distracted.  She denies any active or passive suicidal thoughts or homicidal thoughts.  Her fund of knowledge is below average.  She is alert and oriented x3.  Her insight judgment and impulse control is okay.  Established Problem, Stable/Improving (1), Review of Psycho-Social Stressors (1), Review of Last Therapy Session (1) and Review of Medication Regimen & Side Effects (2)  Assessment: Axis I:  psychosis NOS, rule out paranoid schizophrenia, rule out major depressive disorder with psychosis  Axis II: deferred  Axis III:  see medical history  Axis IV: moderate  Axis V:  V. 50-55   Plan:   Patient is showing some improvement with Haldol 1 mg at bedtime.  I would also continue Wellbutrin XL 300 mg daily.  She has been tolerating the medication however she has facial oral movements but it is a stable.  I would defer any further increase in Haldol because of risk of fall and dizziness.  I recommended to see a therapist in this office for coping and social skills.  She usually bring her daughter for the appointments.  I recommended to call us back if she has any question or any concern.  Otherwise I will see her again in 3 months.  Sharyl Panchal T., MD 11/05/2014

## 2014-11-19 ENCOUNTER — Ambulatory Visit (INDEPENDENT_AMBULATORY_CARE_PROVIDER_SITE_OTHER): Payer: Medicare HMO | Admitting: Internal Medicine

## 2014-11-19 ENCOUNTER — Encounter: Payer: Self-pay | Admitting: Internal Medicine

## 2014-11-19 VITALS — BP 143/65 | HR 81 | Temp 98.6°F | Wt 217.0 lb

## 2014-11-19 DIAGNOSIS — N952 Postmenopausal atrophic vaginitis: Secondary | ICD-10-CM

## 2014-11-19 DIAGNOSIS — Z0000 Encounter for general adult medical examination without abnormal findings: Secondary | ICD-10-CM

## 2014-11-19 DIAGNOSIS — G894 Chronic pain syndrome: Secondary | ICD-10-CM

## 2014-11-19 DIAGNOSIS — F209 Schizophrenia, unspecified: Secondary | ICD-10-CM

## 2014-11-19 DIAGNOSIS — Z87891 Personal history of nicotine dependence: Secondary | ICD-10-CM

## 2014-11-19 DIAGNOSIS — L298 Other pruritus: Secondary | ICD-10-CM

## 2014-11-19 DIAGNOSIS — M79642 Pain in left hand: Secondary | ICD-10-CM

## 2014-11-19 DIAGNOSIS — R05 Cough: Secondary | ICD-10-CM

## 2014-11-19 DIAGNOSIS — I10 Essential (primary) hypertension: Secondary | ICD-10-CM

## 2014-11-19 DIAGNOSIS — M79641 Pain in right hand: Secondary | ICD-10-CM

## 2014-11-19 MED ORDER — FLUCONAZOLE 150 MG PO TABS: 150.0000 mg | ORAL_TABLET | Freq: Every day | ORAL | Status: DC

## 2014-11-19 NOTE — Assessment & Plan Note (Signed)
C/O B hand pain and numbness. Old traumatic injury R index finger but no recent change. C/O pain B thumbs skin btw base of nail and joint. Also numbness B of thenar prominence and 2nd and 3rd fingers.  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  Grip strength 5/5 B. No thenar atrophy. No skin changes.  Likely CTS. Start with wrist splints B. If no improvement 4-6 weeks, NCS / EMG.

## 2014-11-19 NOTE — Progress Notes (Signed)
   Subjective:    Patient ID: [name], female    DOB: 31-Oct-1940, 74 y.o.   MRN: [id number]  HPI  Please see the A&P for the status of the pt's chronic medical problems. Did not bring meds to review.  Review of Systems  HENT: Negative for congestion.   Respiratory: Positive for cough. Negative for shortness of breath.   Genitourinary: Positive for vaginal discharge.       Vaginal itching Urinary stress incontinence  Musculoskeletal:       No recent falls       Objective:   Physical Exam  Constitutional: She appears well-developed and well-nourished.  HENT:  Head: Normocephalic and atraumatic.  Right Ear: External ear normal.  Left Ear: External ear normal.  Eyes: Conjunctivae and EOM are normal.  Cardiovascular: Normal rate, regular rhythm and normal heart sounds.   Pulmonary/Chest: Effort normal. No respiratory distress.  Coarse BS B. Good air flow.  Abdominal: Bowel sounds are normal.  + ABd obesity  Neurological: She is alert.  Skin: Skin is warm and dry. She is not diaphoretic.  Psychiatric: She has a normal mood and affect. Her behavior is normal. Judgment and thought content normal.          Assessment & Plan:

## 2014-11-19 NOTE — Assessment & Plan Note (Signed)
Has shower chair but wants a stool. Medicare will not cover until May 13th. Wil order late May.  C/o dry, hacking cough. Lung exam with only coarse BS. Ex smoker. Explained 2/2 tobacco, lungs will take longer to heal after chest cold. Gave sxs to return to [facility].

## 2014-11-19 NOTE — Assessment & Plan Note (Signed)
BP Readings from Last 3 Encounters:  11/19/14 143/65  11/05/14 122/69  08/13/14 112/58   Good control on norvasc 5, lasix 20, and cozaar 100. Cont meds. BMOP next appt,

## 2014-11-19 NOTE — Assessment & Plan Note (Signed)
Reviewed most recent pysch note. Has future appts. On Haldol and Wellbutrin.

## 2014-11-19 NOTE — Assessment & Plan Note (Signed)
C/o 2-3 weeks of itching inside of L labial lip. Asked repeatedly whether sxs were on outside skin or inside vagina and she cont confirmed that was inside vagina. Some D/C. Prior episode was 6-7 months ago. Used to get 3 diflucan to take 1 PRN. Has stress incontinence and uses pad due to leakage. Had stopped premarin last appt - pt choice.   Could be skin irritation 2/2 urine. atrophic vaginitis. Or vaginal candidiasis. Gave pt tx choices and she chose to try one pill Diflucan. If no better, to call for [facility] acute appt for wet prep and vaginal cuff PAP (vague h/o cervical cancer).

## 2014-11-19 NOTE — Patient Instructions (Signed)
1. If the pill for yeast infection doesn't work, pls call to make an appt for a female exam in one week. 2. Try the braces for your hand pain. If it doesn't work, I will arrange a test 3. I am working on the shower stool. Call me if you do not have it in 2 weeks 4. Call neuro rehab at [phone] to make an appt. This is to prevent falls

## 2014-11-25 ENCOUNTER — Encounter: Payer: Self-pay | Admitting: Licensed Clinical Social Worker

## 2014-11-25 NOTE — Progress Notes (Signed)
CSW met with [name] and [name] during pt's scheduled [facility] appointment. Provided [name] with Garment/textile technologist and discussed benefits of having Living Will and appointing a [place].  CSW answered questions and provided materials.  Pt and Drake deny having additional question or social work needs at this time.  Pt/Drake provided with CSW contact information.

## 2014-11-26 ENCOUNTER — Ambulatory Visit (INDEPENDENT_AMBULATORY_CARE_PROVIDER_SITE_OTHER): Payer: Medicare HMO | Admitting: Psychology

## 2014-11-26 ENCOUNTER — Encounter (HOSPITAL_COMMUNITY): Payer: Self-pay | Admitting: Psychology

## 2014-11-26 DIAGNOSIS — F32A Depression, unspecified: Secondary | ICD-10-CM

## 2014-11-26 DIAGNOSIS — Z634 Disappearance and death of family member: Secondary | ICD-10-CM

## 2014-11-26 DIAGNOSIS — F29 Unspecified psychosis not due to a substance or known physiological condition: Secondary | ICD-10-CM

## 2014-11-26 DIAGNOSIS — F329 Major depressive disorder, single episode, unspecified: Secondary | ICD-10-CM

## 2014-11-27 ENCOUNTER — Telehealth: Payer: Self-pay | Admitting: *Deleted

## 2014-11-27 NOTE — Telephone Encounter (Signed)
Pt called to let Dr Lynnae January know yeast infection much better. Hilda Blades Vickey Ewbank RN 11/27/14 11:50AM

## 2014-11-27 NOTE — Telephone Encounter (Signed)
Thanks

## 2014-11-30 ENCOUNTER — Other Ambulatory Visit: Payer: Self-pay | Admitting: Internal Medicine

## 2014-12-01 ENCOUNTER — Encounter (HOSPITAL_COMMUNITY): Payer: Self-pay | Admitting: Psychology

## 2014-12-01 NOTE — Progress Notes (Signed)
[name] is a 74 y.o. female patient referred for counseling by Dr. Adele Schilder.  Patient:   [name]   DOB:   September 18, 1941  MR Number:  [id number]  Location:  [facility] [address] [id number] [place] [id number] Dept: [phone]           Date of Service:   11/26/14  Start Time:   1.33pm End Time:   2.30pm  Provider/Observer:  Jan Fireman [facility]       Billing Code/Service: [phone]  Chief Complaint:     Chief Complaint  Patient presents with  . Depression    Reason for Service:  Pt is referred for counseling by Dr. Adele Schilder who is treating pt for psychosis.  Pt reports that she was wanting to have someone to talk to as she is still struggling w/ grief of her son who completed suicide June 2013.  Pt also reports her daughter is a stressor and annoys her making decisions and doing things she doesn't want or ask of her.    Current Status:  Pt reports periods of tearfulness some days of the week.  Pt also reports struggles with sleep at times.  Pt reports feels depressed some days as well.  Pt discussed her stressors of grief for her son and also grief of her 2 son's who are incarcerated and have been for several years.    Reliability of Information: Pt provided information and documentation of Dr. Marguerite Olea reviewed.   Behavioral Observation: JENIN BIRDSALL  presents as a 74 y.o.-year-old black Female who appeared her stated age. her dress was Appropriate and she was Well Groomed and her manners were Appropriate to the situation.  There were  physical disabilities noted as pt had difficulty hearing counselor in session.  she displayed an appropriate level of cooperation and motivation.    Interactions:    Active   Attention:   distracted easily  Memory:   poor historian  Visuo-spatial:   not examined  Speech (Volume):  normal  Speech:   fast and rambling  Thought  Process:  Circumstantial  Though Content:  WNL  Orientation:   person, place, time/date and situation  Judgment:   Fair  Planning:   Fair  Affect:    Appropriate  Mood:    Depressed  Insight:   Fair  Intelligence:   normal  Marital Status/Living: Pt lives by herself in [place], [place].  Pt at one point referred to being from [facility]- but this wasn't clear if she was referring to her or her parents.  Pt was married her husband died many years ago.  Pt had 4 children- 3 sons and one daughter.  Pt reported youngest son completed suicide in June 2013. He other 2 son's have been incarcerated for several years- one in [place] the other in [place].  Pt believes her one son may be released this year.  Pt reports he daughter helps her out but expresses get irritated by her daughter.    Supports/Strengths:  Pt reports that her daughter is supportive.  Pt reports strong in her catholic faith and this is a support to her.    Current Employment: Retired  Substance Use:  No concerns of substance abuse are reported.    Education:   some college  Medical History:   Past Medical History  Diagnosis Date  . History of cervical cancer 1972  . Ovarian cancer 1972    S/P oophorectomy  .  GERD (gastroesophageal reflux disease)   . Gout   . Hypertension   . DJD (degenerative joint disease), multiple sites     Low back pain worst  . DDD (degenerative disc disease), lumbosacral   . Osteoarthritis of hip     bilateral hips  . Hx-TIA (transient ischemic attack) 05/13/2001    Right facial numbness  . Sensorineural hearing loss of both ears   . Mitral valve prolapse 10/27/1999    Subsequent 2D echo show normal mitral valve  . Chronic venous insufficiency   . Colon, diverticulosis Sept. 2011    outpatient colonoscopy by Dr. Cristina Gong.  Need record  . Hyperlipidemia   . Recurrent boils     History of MRSA skin infections with abscess  . Gout     Uric Acid level 4.2 on 300 allopurinol  .  Depression   . Psychosis         Outpatient Encounter Prescriptions as of 11/26/2014  Medication Sig  . albuterol (PROVENTIL HFA;VENTOLIN HFA) 108 (90 BASE) MCG/ACT inhaler Inhale 2 puffs into the lungs every 6 (six) hours as needed for wheezing or shortness of breath.  . allopurinol (ZYLOPRIM) 300 MG tablet TAKE 1 TABLET BY MOUTH DAILY  . amLODipine (NORVASC) 5 MG tablet TAKE 2 TABLETS BY MOUTH EVERY DAY  . aspirin EC 81 MG tablet Take 81 mg by mouth daily.  [name] atorvastatin (LIPITOR) 40 MG tablet Take 1 tablet (40 mg total) by mouth daily.  . Biotin (BIOTIN 5000) 5 MG CAPS Take 1 capsule by mouth daily.    [name] buPROPion (WELLBUTRIN XL) 300 MG 24 hr tablet Take 1 tablet (300 mg total) by mouth every morning.  . Calcium Carbonate-Vitamin D (CALCIUM-VITAMIN D) 500-200 MG-UNIT per tablet Take 1 tablet by mouth daily.    [name] docusate sodium (COLACE) 100 MG capsule Take 1 capsule (100 mg total) by mouth every 12 (twelve) hours.  . fluconazole (DIFLUCAN) 150 MG tablet Take 1 tablet (150 mg total) by mouth daily.  . fluocinonide cream (LIDEX) 0.05 % APPLY TO AFFECTED AREA TWICE DAILY  . furosemide (LASIX) 20 MG tablet TAKE 1 TABLET (20 MG TOTAL) BY MOUTH DAILY. OKAY TO USE ONE MORE TABLET DAILY FOR INCREASED SWELLING AS NEEDED.  . haloperidol (HALDOL) 1 MG tablet Take 1 tablet (1 mg total) by mouth at bedtime.  [name] losartan (COZAAR) 100 MG tablet TAKE 1 TABLET BY MOUTH DAILY  . oxybutynin (DITROPAN-XL) 5 MG 24 hr tablet TAKE ONE TABLET BY MOUTH AT BEDTIME  . polyethylene glycol (MIRALAX / GLYCOLAX) packet MIX 17 GRAMS  (1 CAPFUL) WITH 8 OUNCES OF FLUID AND  TAKE BY MOUTH DAILY AS NEEDED  . potassium chloride (MICRO-K) 10 MEQ CR capsule Take 1 capsule (10 mEq total) by mouth daily.  . traMADol (ULTRAM) 50 MG tablet Take 1 tablet (50 mg total) by mouth every 12 (twelve) hours as needed.  . [DISCONTINUED] fluticasone (FLONASE) 50 MCG/ACT nasal spray INHALE 2 SPRAYS IN EACH NOSTRIL ONCE DAILY  . [DISCONTINUED]  pantoprazole (PROTONIX) 40 MG tablet TAKE 1 TABLET BY MOUTH DAILY        Pt taking meds as prescribed.  Sexual History:   History  Sexual Activity  . Sexual Activity: Not on file    Abuse/Trauma History: Pt death of son January 29, 2012- completed suicide.  Psychiatric History:  Pt had inpt tx several years ago.  Pt tx by Dr. Adele Schilder in May 2013 for hallucinations and parania.  Pt stopped medications feeling much better and  stopped tx.  Pt returned to Dr. Adele Schilder in jan 2015 for increased depression, irritability, poor sleep and paranoid.    Family Med/Psych History:  Family History  Problem Relation Age of Onset  . Diabetes Mother   . Heart disease Mother   . Hearing loss Mother   . Stroke Mother   . Schizophrenia Maternal Grandmother   . Diabetes Maternal Grandfather   . Other Brother     brother died in mental health hospital    Risk of Suicide/Violence: virtually non-existent Pt doesn't endorse any SI- depression symptoms related to grief and mild.   Impression/DX:  Pt is referred for counseling by Dr. Adele Schilder for coping skills.  Pt has been dx w/ Psychosis NOS by Dr. Adele Schilder w/ R/O of Paranoid Schizophrenia or MDD w/ psychosis.  Pt endorses depressed mood and tearfulness some days related to the grief of her son who completed suicide 2.5 years ago. Pt has other losses w/ her other 2 sons incarcerations for several years.  Pt does have support from her daughter.  Pt strength of her faith.    Disposition/Plan:  Pt to begin counseling for bereavement and continue to f/u as scheduled w/ Dr. Adele Schilder. Diagnosis:    Psychosis NOS, r/o Schizophrenia, chronic condition, R/o Depression with psychosis      Bereavement                 Nishtha Raider, LPC

## 2014-12-01 NOTE — Telephone Encounter (Signed)
If sxs returned, needs appt for pelvic

## 2014-12-08 ENCOUNTER — Ambulatory Visit: Payer: Medicare HMO | Attending: Internal Medicine

## 2014-12-08 DIAGNOSIS — R531 Weakness: Secondary | ICD-10-CM | POA: Diagnosis not present

## 2014-12-08 DIAGNOSIS — R269 Unspecified abnormalities of gait and mobility: Secondary | ICD-10-CM | POA: Diagnosis not present

## 2014-12-08 DIAGNOSIS — R5381 Other malaise: Secondary | ICD-10-CM

## 2014-12-08 DIAGNOSIS — R296 Repeated falls: Secondary | ICD-10-CM

## 2014-12-08 DIAGNOSIS — Z96643 Presence of artificial hip joint, bilateral: Secondary | ICD-10-CM | POA: Insufficient documentation

## 2014-12-08 NOTE — Therapy (Signed)
[facility] [address] [place], [place], [address] Phone: [phone]   Fax:  [phone]  Physical Therapy Evaluation  Patient Details  Name: [name] MRN: [id number] Date of Birth: Sep 03, 1941 Referring Provider:  Bartholomew Crews, MD  Encounter Date: 12/08/2014      PT End of Session - 12/08/14 1701    Visit Number 1   Number of Visits 17   Date for PT Re-Evaluation 02/06/15   Authorization Type G-code every 10th visit.   PT Start Time 5056  pt arrived late and needed to use bathroom upon arrival.   PT Stop Time 1531   PT Time Calculation (min) 40 min   Activity Tolerance Patient tolerated treatment well   Behavior During Therapy --  Difficulty with attention to task, as pt told many stories during session.      Past Medical History  Diagnosis Date  . History of cervical cancer 1972  . Ovarian cancer 1972    S/P oophorectomy  . GERD (gastroesophageal reflux disease)   . Gout   . Hypertension   . DJD (degenerative joint disease), multiple sites     Low back pain worst  . DDD (degenerative disc disease), lumbosacral   . Osteoarthritis of hip     bilateral hips  . Hx-TIA (transient ischemic attack) 05/13/2001    Right facial numbness  . Sensorineural hearing loss of both ears   . Mitral valve prolapse 10/27/1999    Subsequent 2D echo show normal mitral valve  . Chronic venous insufficiency   . Colon, diverticulosis Sept. 2011    outpatient colonoscopy by Dr. Cristina Gong.  Need record  . Hyperlipidemia   . Recurrent boils     History of MRSA skin infections with abscess  . Gout     Uric Acid level 4.2 on 300 allopurinol  . Depression   . Psychosis     Past Surgical History  Procedure Laterality Date  . Joint replacement      bilateral hip replacement  . Abdominal hysterectomy      1972  . Oophorectomy      1972 for ovarian cancer  . Lumbar disc surgery      L5-S1 7/07  . Cervical  discectomy  7/07    C5-C6    There were no vitals taken for this visit.  Visit Diagnosis:  Abnormality of gait - Plan: PT plan of care cert/re-cert  Frequent falls - Plan: PT plan of care cert/re-cert  Debility - Plan: PT plan of care cert/re-cert      Subjective Assessment - 12/08/14 1507    Symptoms Falls while ambulating, impaired balance, B LE weakness, and B LE pain   Pertinent History Schizophrenia, B THA (approx. 10 years ago), chronic pain    Patient Stated Goals Reduce hip pain so she can get back to the gym for water aerobics, walk down steps safely, stop falling   Currently in Pain? No/denies  pt reported she has intermittent L hip pain when performing sit to stand.          [facility] PT Assessment - 12/08/14 1512    Assessment   Medical Diagnosis Frequent falls   Onset Date --  Pt is not sure of onset date.   Precautions   Precautions Fall   Restrictions   Weight Bearing Restrictions No   Balance Screen   Has the patient fallen in the past 6 months Yes   How many times? 4-5  Has the patient had a decrease in activity level because of a fear of falling?  Yes   Is the patient reluctant to leave their home because of a fear of falling?  Yes   [place] Private residence   Living Arrangements Alone   Available Help at Discharge Family  daughter   Type of [place] entrance   [place] One level   [place] - 4 wheels;Cane - single point;Electric scooter;Other (comment)  shower stool   Prior Function   Level of Independence Independent with basic ADLs;Independent with homemaking with ambulation;Independent with gait;Independent with transfers   Leisure Go to church   Cognition   Overall Cognitive Status No family/caregiver present to determine baseline cognitive functioning   Behaviors Perseveration;Other (comment)  Difficult to re-direct pt   Observation/Other Assessments   Observations Pt  reported she had a son who committed suicide, and reported she is still upset about his death. PT had time redirecting pt's attention to task, as she told many stories during eval not related to PT.   Sensation   Light Touch Appears Intact   Posture/Postural Control   Posture/Postural Control Postural limitations   Postural Limitations Flexed trunk;Forward head;Rounded Shoulders   AROM   Overall AROM  Within functional limits for tasks performed   Strength   Overall Strength Deficits   Overall Strength Comments B LE strength grossly 4/5, except for hip flexion 3+/5.   Flexibility   Soft Tissue Assessment /Muscle Lenght yes   Hamstrings Decreased hamstring flexibility, as pt could hardly reach forward while seated, but she did not want PT to test length due to B hip pain.   Transfers   Transfers Sit to Stand;Stand to Sit   Sit to Stand 5: Supervision;With upper extremity assist;With armrests;From chair/3-in-1   Stand to Sit 5: Supervision;With upper extremity assist;With armrests;To chair/3-in-1   Ambulation/Gait   Ambulation/Gait Yes   Ambulation/Gait Assistance 5: Supervision   Ambulation/Gait Assistance Details Pt ambulated over even terrain without LOB.   Ambulation Distance (Feet) 100 Feet   Assistive device Rollator   Gait Pattern Step-through pattern;Decreased dorsiflexion - left;Decreased dorsiflexion - right;Trendelenburg;Antalgic;Trunk flexed  Decr. B dorsiflexion could be due to boots pt was wearing.   Ambulation Surface Level;Indoor   Gait velocity 1.10ft/sec.  with rollator   Balance   Balance Assessed Yes   Static Standing Balance   Static Standing - Balance Support No upper extremity supported   Static Standing - Level of Assistance 4: Min assist;Other (comment)  min guard   Static Standing Balance -  Activities  Single Leg Stance - Right Leg;Single Leg Stance - Left Leg   Static Standing - Comment/# of Minutes Pt able to stand on each LE for 1 second with min guard,  before requiring min A to maintain balance. Will assess balance further at next session.    Standardized Balance Assessment   Standardized Balance Assessment Timed Up and Go Test   Timed Up and Go Test   TUG Normal TUG   Normal TUG (seconds) 23.44  with rollator                            PT Short Term Goals - 12/08/14 1705    PT SHORT TERM GOAL #1   Title Be independent in HEP to improve strength, endurance, and functional mobility. Target date: 01/05/15.   Status New  PT SHORT TERM GOAL #2   Title Assess BERG and write appropriate STG and LTG. Target date: 01/05/15.   Status New   PT SHORT TERM GOAL #3   Title Pt will ambulate 300' over even terrain with LRAD at MOD I level to improve functional moblility. Target date: 01/05/15.   Status New   PT SHORT TERM GOAL #4   Title Pt will report no falls over the last 2 weeks to improve safety during funtional mobility. Target date: 01/05/15.   Status New           PT Long Term Goals - 12/29/2014 1708    PT LONG TERM GOAL #1   Title Pt will verbalize undestanding of falls prevention strategies to decrease falls risk. Target date: 02/02/15.   Status New   PT LONG TERM GOAL #2   Title Pt will ambulate 400' over even/uneven terrain with LRAD at MOD I level to improve functional mobility. Target date: 02/02/15.   Status New   PT LONG TERM GOAL #3   Title Pt will report she has signed up for water aerobics at fitness center to maintain gains made during PT and to improve quality of life. Target date: 02/02/15.   Status New   PT LONG TERM GOAL #4   Title Pt will report decrease in B hip pain during transfers and ambulation to <3/10 to improve quality of life and to perform ADLs with less pain. Target date: 02/02/15.   Status New   PT LONG TERM GOAL #5   Title Pt will perform TUG in </=18 seconds to decrease falls risk. Target date: 02/02/15.   Status New   Additional Long Term Goals   Additional Long Term Goals Yes   PT LONG TERM  GOAL #6   Title Pt will improve gait speed to >/=2.30ft/sec. to decease falls risk. Target date: 02/02/15.   Status New               Plan - Dec 29, 2014 1505    Clinical Impression Statement Pt is a pleasant 74y/o female presenting to OPPT neuro with frequent falls and difficulty walking. Pt reported she has fallen 4-5 times in the last 6 months. Pt reported she had PT after hip replacements and she had a bad experience with PT as "they made me bend to far and hurt my legs". Pt has history of schizophrenia and had difficulty attending to task, and told many stories unrelated to PT during session.    Pt will benefit from skilled therapeutic intervention in order to improve on the following deficits Abnormal gait;Decreased endurance;Decreased safety awareness;Impaired flexibility;Decreased knowledge of use of DME;Pain;Decreased balance;Decreased strength;Decreased mobility;Other (comment)  difficulty attending to task.   Rehab Potential Good   PT Frequency 2x / week   PT Duration 8 weeks   PT Treatment/Interventions Gait training;Neuromuscular re-education;ADLs/Self Care Home Management;Stair training;Biofeedback;Functional mobility training;Patient/family education;Therapeutic activities;Cryotherapy;Therapeutic exercise;Manual techniques;DME Instruction;Balance training   PT Next Visit Plan Assess BERG and initiate balance/strength HEP.   Consulted and Agree with Plan of Care Patient          G-Codes - 2014-12-29 1713    Functional Assessment Tool Used Gait speed with rollator: 1.60ft/sec.; TUG with rollator: 23.44 sec.; Single leg stance (B LE): 1 second with min guard   Functional Limitation Mobility: Walking and moving around   Mobility: Walking and Moving Around Current Status ([id number]) At least 40 percent but less than 60 percent impaired, limited or restricted   Mobility: Walking and Moving  Around Goal Status [phone]) At least 20 percent but less than 40 percent impaired, limited or  restricted       Problem List Patient Active Problem List   Diagnosis Date Noted  . Bereavement 12/01/2014  . Psychosis 12/01/2014  . Abnormal blood findings 08/13/2014  . Reactive airway disease 08/11/2014  . History of cancer 08/11/2014  . OSA (obstructive sleep apnea) 08/11/2014  . Healthcare maintenance 07/17/2013  . Chronic kidney disease (CKD), stage III (moderate) 10/04/2012  . Schizophrenia, chronic condition 01/20/2012  . Postmenopausal atrophic vaginitis 11/01/2010  . Chronic diastolic heart failure 62/83/6629  . Allergic rhinitis 10/14/2009  . Obesity 05/14/2009  . Chronic pain syndrome 04/29/2008  . Anemia 09/25/2007  . Hyperlipidemia 09/03/2006  . Gout 09/03/2006  . Essential hypertension 09/03/2006  . GERD 09/03/2006  . Hx-TIA (transient ischemic attack) 05/13/2001    Milarose Savich L 12/08/2014, 5:16 PM  [facility] [address] [place], [place], [address] Phone: [phone]   Fax:  [phone]    Geoffry Paradise, PT,DPT 12/08/2014 5:16 PM Phone: [phone] Fax: [phone]

## 2014-12-08 NOTE — Addendum Note (Signed)
Addended by: Elza Rafter on: 12/08/2014 05:21 PM   Modules accepted: Medications

## 2014-12-22 ENCOUNTER — Ambulatory Visit: Payer: Medicare HMO

## 2014-12-22 DIAGNOSIS — R269 Unspecified abnormalities of gait and mobility: Secondary | ICD-10-CM

## 2014-12-22 DIAGNOSIS — R296 Repeated falls: Secondary | ICD-10-CM

## 2014-12-22 DIAGNOSIS — R5381 Other malaise: Secondary | ICD-10-CM

## 2014-12-22 NOTE — Therapy (Signed)
[facility] [address] [place], [place], [address] Phone: [phone]   Fax:  [phone]  Physical Therapy Treatment  Patient Details  Name: [name] MRN: [id number] Date of Birth: Mar 25, 1941 Referring Provider:  Bartholomew Crews, MD  Encounter Date: 12/22/2014      PT End of Session - 12/22/14 1606    Visit Number 2   Number of Visits 17   Date for PT Re-Evaluation 02/06/15   Authorization Type G-code every 10th visit.   PT Start Time 1400   PT Stop Time 1444   PT Time Calculation (min) 44 min   Equipment Utilized During Treatment Gait belt   Activity Tolerance Patient tolerated treatment well   Behavior During Therapy WFL for tasks assessed/performed      Past Medical History  Diagnosis Date  . History of cervical cancer 1972  . Ovarian cancer 1972    S/P oophorectomy  . GERD (gastroesophageal reflux disease)   . Gout   . Hypertension   . DJD (degenerative joint disease), multiple sites     Low back pain worst  . DDD (degenerative disc disease), lumbosacral   . Osteoarthritis of hip     bilateral hips  . Hx-TIA (transient ischemic attack) 05/13/2001    Right facial numbness  . Sensorineural hearing loss of both ears   . Mitral valve prolapse 10/27/1999    Subsequent 2D echo show normal mitral valve  . Chronic venous insufficiency   . Colon, diverticulosis Sept. 2011    outpatient colonoscopy by Dr. Cristina Gong.  Need record  . Hyperlipidemia   . Recurrent boils     History of MRSA skin infections with abscess  . Gout     Uric Acid level 4.2 on 300 allopurinol  . Depression   . Psychosis     Past Surgical History  Procedure Laterality Date  . Joint replacement      bilateral hip replacement  . Abdominal hysterectomy      1972  . Oophorectomy      1972 for ovarian cancer  . Lumbar disc surgery      L5-S1 7/07  . Cervical discectomy  7/07    C5-C6    There were no vitals  taken for this visit.  Visit Diagnosis:  Abnormality of gait  Frequent falls  Debility      Subjective Assessment - 12/22/14 1405    Symptoms Pt denied falls or chnages since last visit.    Pertinent History Schizophrenia, B THA (approx. 10 years ago), chronic pain    Patient Stated Goals Reduce hip pain so she can get back to the gym for water aerobics, walk down steps safely, stop falling   Currently in Pain? No/denies                    [facility] Adult PT Treatment/Exercise - 12/22/14 1407    Balance   Balance Assessed Yes   Static Standing Balance   Static Standing - Balance Support Right upper extremity supported;No upper extremity supported   Static Standing - Level of Assistance 5: Stand by assistance;Other (comment)  min guard   Static Standing - Comment/# of Minutes Balance activities performed in corner with chair in front of pt for safety, B LEs, 1-3 sets with 10-30second holds: feet apart/together with and without head turns, feet apart/together with eyes open/closed, tandem stance, modified tandem stance, single leg stance (with UE support). VC's for technique. Pt required one seated  rest break after balance exercises due to fatigue.   Standardized Balance Assessment   Standardized Balance Assessment Berg Balance Test   Berg Balance Test   Sit to Stand Able to stand without using hands and stabilize independently   Standing Unsupported Able to stand safely 2 minutes   Sitting with Back Unsupported but Feet Supported on Floor or Stool Able to sit safely and securely 2 minutes   Stand to Sit Sits safely with minimal use of hands   Transfers Able to transfer safely, minor use of hands   Standing Unsupported with Eyes Closed Able to stand 10 seconds with supervision   Standing Ubsupported with Feet Together Able to place feet together independently and stand for 1 minute with supervision   From Standing, Reach Forward with Outstretched Arm Can reach forward >12 cm  safely (5")  5.5"   From Standing Position, Pick up Object from Floor Able to pick up shoe, needs supervision   From Standing Position, Turn to Look Behind Over each Shoulder Looks behind one side only/other side shows less weight shift   Turn 360 Degrees Needs close supervision or verbal cueing   Standing Unsupported, Alternately Place Feet on Step/Stool Able to complete 4 steps without aid or supervision   Standing Unsupported, One Foot in Front Able to take small step independently and hold 30 seconds   Standing on One Leg Able to lift leg independently and hold equal to or more than 3 seconds   Total Score 42                PT Education - 12/22/14 1606    Education provided Yes   Education Details Balance HEP.   Person(s) Educated Patient   Methods Explanation;Demonstration;Verbal cues;Handout   Comprehension Verbalized understanding;Returned demonstration          PT Short Term Goals - 12/22/14 1608    PT SHORT TERM GOAL #1   Title Be independent in HEP to improve strength, endurance, and functional mobility. Target date: 01/05/15.   Status On-going   PT SHORT TERM GOAL #2   Title Assess BERG and write appropriate STG and LTG. Target date: 01/05/15.   Status Achieved   PT SHORT TERM GOAL #3   Title Pt will ambulate 300' over even terrain with LRAD at MOD I level to improve functional moblility. Target date: 01/05/15.   Status On-going   PT SHORT TERM GOAL #4   Title Pt will report no falls over the last 2 weeks to improve safety during funtional mobility. Target date: 01/05/15.   Status On-going   PT SHORT TERM GOAL #5   Title Pt will improve BERG balance score to 46/56 to decrease falls risk. Target date: 01/05/15.   Status New           PT Long Term Goals - 12/22/14 1608    PT LONG TERM GOAL #1   Title Pt will verbalize undestanding of falls prevention strategies to decrease falls risk. Target date: 02/02/15.   Status On-going   PT LONG TERM GOAL #2   Title Pt  will ambulate 400' over even/uneven terrain with LRAD at MOD I level to improve functional mobility. Target date: 02/02/15.   Status On-going   PT LONG TERM GOAL #3   Title Pt will report she has signed up for water aerobics at fitness center to maintain gains made during PT and to improve quality of life. Target date: 02/02/15.   Status On-going   PT LONG  TERM GOAL #4   Title Pt will report decrease in B hip pain during transfers and ambulation to <3/10 to improve quality of life and to perform ADLs with less pain. Target date: 02/02/15.   Status On-going   PT LONG TERM GOAL #5   Title Pt will perform TUG in </=18 seconds to decrease falls risk. Target date: 02/02/15.   Status On-going   Additional Long Term Goals   Additional Long Term Goals Yes   PT LONG TERM GOAL #6   Title Pt will improve gait speed to >/=2.37ft/sec. to decease falls risk. Target date: 02/02/15.   Status On-going   PT LONG TERM GOAL #7   Title Pt will improve BERG score to >/=50/56 to decrease falls risk. Target date: 02/02/15.   Status New               Plan - 12/22/14 1606    Clinical Impression Statement Pt scored a 42/56 on BERG balance scale indicating she is at significant risk for recurrent falls. Pt was able to attend to task today.  Pt tolerated balance HEP well. Pt would continue to benefit from skilled PT to improve safety during functional mobility.   Pt will benefit from skilled therapeutic intervention in order to improve on the following deficits Abnormal gait;Decreased endurance;Decreased safety awareness;Impaired flexibility;Decreased knowledge of use of DME;Pain;Decreased balance;Decreased strength;Decreased mobility;Other (comment)   Rehab Potential Good   PT Frequency 2x / week   PT Duration 8 weeks   PT Treatment/Interventions Gait training;Neuromuscular re-education;ADLs/Self Care Home Management;Stair training;Biofeedback;Functional mobility training;Patient/family education;Therapeutic  activities;Cryotherapy;Therapeutic exercise;Manual techniques;DME Instruction;Balance training   PT Next Visit Plan Initiate strengthening HEP.   Consulted and Agree with Plan of Care Patient        Problem List Patient Active Problem List   Diagnosis Date Noted  . Bereavement 12/01/2014  . Psychosis 12/01/2014  . Abnormal blood findings 08/13/2014  . Reactive airway disease 08/11/2014  . History of cancer 08/11/2014  . OSA (obstructive sleep apnea) 08/11/2014  . Healthcare maintenance 07/17/2013  . Chronic kidney disease (CKD), stage III (moderate) 10/04/2012  . Schizophrenia, chronic condition 01/20/2012  . Postmenopausal atrophic vaginitis 11/01/2010  . Chronic diastolic heart failure 98/26/4158  . Allergic rhinitis 10/14/2009  . Obesity 05/14/2009  . Chronic pain syndrome 04/29/2008  . Anemia 09/25/2007  . Hyperlipidemia 09/03/2006  . Gout 09/03/2006  . Essential hypertension 09/03/2006  . GERD 09/03/2006  . Hx-TIA (transient ischemic attack) 05/13/2001    Shadoe Cryan L 12/22/2014, 4:10 PM  [facility] [address] [place], [place], [address] Phone: [phone]   Fax:  [phone]     Geoffry Paradise, PT,DPT 12/22/2014 4:10 PM Phone: [phone] Fax: [phone]

## 2014-12-22 NOTE — Patient Instructions (Signed)
Perform balance exercises in a corner with a chair in front of you for safety:  Feet Together, Head Motion - Eyes Open   With eyes open, feet together, move head slowly: up and down and side and side for 30 seconds. Repeat __3__ times per session. Do __1__ sessions per day.  Copyright  VHI. All rights reserved.  Feet Together, Varied Arm Positions - Eyes Closed   Stand with feet together and arms at your side. Close eyes and visualize upright position. Hold __10-20__ seconds. Repeat __3__ times per session. Do _1___ sessions per day.  Copyright  VHI. All rights reserved.  Feet Partial Heel-Toe, Varied Arm Positions - Eyes Open   With eyes open, right foot partially in front of the other, arms at your side, look straight ahead at a stationary object. Hold __30__ seconds. Repeat with left foot in front. Repeat __3__ times per session. Do _1___ sessions per day.  Copyright  VHI. All rights reserved.  Single Leg - Eyes Open   Holding support, lift right leg while maintaining balance over other leg. Progress to removing hands from support surface for longer periods of time. Repeat with left leg lifted. Hold__10-20__ seconds. Repeat __3__ times per session. Do __1__ sessions per day.  Copyright  VHI. All rights reserved.

## 2014-12-23 ENCOUNTER — Ambulatory Visit: Payer: Medicare HMO

## 2014-12-23 DIAGNOSIS — R296 Repeated falls: Secondary | ICD-10-CM

## 2014-12-23 DIAGNOSIS — R269 Unspecified abnormalities of gait and mobility: Secondary | ICD-10-CM | POA: Diagnosis not present

## 2014-12-23 DIAGNOSIS — R5381 Other malaise: Secondary | ICD-10-CM

## 2014-12-23 NOTE — Patient Instructions (Signed)
"  I love a [employer], building services onto counter, and walk 10 feet down counter while marching knees up toward the ceiling. Repeat _4___ times. Do _1___ sessions per day.  http://gt2.exer.us/345   Copyright  VHI. All rights reserved.  FLEXION: Standing - Stable (Active)   Stand, both feet flat, hold onto counter. Bend right knee, bringing heel toward buttocks. Repeat with left leg. Complete _1__ sets of _10__ repetitions. Perform _1__ sessions per day.  http://gtsc.exer.us/241   Copyright  VHI. All rights reserved.  ABDUCTION: Standing (Active)   Stand, feet flat while holding onto counter. Lift right leg out to side. Repeat with left leg. Complete _2__ sets of _5__ repetitions. Perform _1__ sessions per day.  http://gtsc.exer.us/111   Copyright  VHI. All rights reserved.  EXTENSION: Standing (Active)   Stand, both feet flat. Draw right leg behind body as far as possible. Repeat with left leg. Complete _1__ sets of _10__ repetitions. Perform _1__ sessions per day.  http://gtsc.exer.us/77   Copyright  VHI. All rights reserved.

## 2014-12-23 NOTE — Therapy (Signed)
[facility] [address] [place], [place], [address] Phone: [phone]   Fax:  [phone]  Physical Therapy Treatment  Patient Details  Name: [name] MRN: [id number] Date of Birth: 17-Oct-1941 Referring Provider:  Bartholomew Crews, MD  Encounter Date: 12/23/2014      PT End of Session - 12/23/14 1634    Visit Number 3   Number of Visits 17   Date for PT Re-Evaluation 02/06/15   Authorization Type G-code every 10th visit.   PT Start Time 1505   PT Stop Time 1530   PT Time Calculation (min) 25 min   Activity Tolerance Patient tolerated treatment well   Behavior During Therapy WFL for tasks assessed/performed      Past Medical History  Diagnosis Date  . History of cervical cancer 1972  . Ovarian cancer 1972    S/P oophorectomy  . GERD (gastroesophageal reflux disease)   . Gout   . Hypertension   . DJD (degenerative joint disease), multiple sites     Low back pain worst  . DDD (degenerative disc disease), lumbosacral   . Osteoarthritis of hip     bilateral hips  . Hx-TIA (transient ischemic attack) 05/13/2001    Right facial numbness  . Sensorineural hearing loss of both ears   . Mitral valve prolapse 10/27/1999    Subsequent 2D echo show normal mitral valve  . Chronic venous insufficiency   . Colon, diverticulosis Sept. 2011    outpatient colonoscopy by Dr. Cristina Gong.  Need record  . Hyperlipidemia   . Recurrent boils     History of MRSA skin infections with abscess  . Gout     Uric Acid level 4.2 on 300 allopurinol  . Depression   . Psychosis     Past Surgical History  Procedure Laterality Date  . Joint replacement      bilateral hip replacement  . Abdominal hysterectomy      1972  . Oophorectomy      1972 for ovarian cancer  . Lumbar disc surgery      L5-S1 7/07  . Cervical discectomy  7/07    C5-C6    There were no vitals taken for this visit.  Visit Diagnosis:   Abnormality of gait  Frequent falls  Debility      Subjective Assessment - 12/23/14 1508    Symptoms Pt arrived 20 minutes late. Pt denied falls or changes since last visit. Pt's daughter, [name], present during session.   Pertinent History Schizophrenia, B THA (approx. 10 years ago), chronic pain    Patient Stated Goals Reduce hip pain so she can get back to the gym for water aerobics, walk down steps safely, stop falling   Currently in Pain? No/denies        Therex: Pt performed therex standing at counter with 1-2 UE support and min guard to supervision: VC's, tactile cues, and demonstration for technique. Pt required two seated rest breaks due to R hip pain and fatigue. -B hip ext. 1x10. -B hip abd. 2x5. -B knee flexion 1x10. -B hip marches along counter 4x7'.                   PT Education - 12/23/14 1634    Education provided Yes   Education Details Strength HEP   Person(s) Educated Patient   Methods Explanation;Demonstration;Tactile cues;Verbal cues;Handout   Comprehension Verbalized understanding;Returned demonstration;Need further instruction          PT Short  Term Goals - 12/23/14 1636    PT SHORT TERM GOAL #1   Title Be independent in HEP to improve strength, endurance, and functional mobility. Target date: 01/05/15.   Status On-going   PT SHORT TERM GOAL #2   Title Assess BERG and write appropriate STG and LTG. Target date: 01/05/15.   Status Achieved   PT SHORT TERM GOAL #3   Title Pt will ambulate 300' over even terrain with LRAD at MOD I level to improve functional moblility. Target date: 01/05/15.   Status On-going   PT SHORT TERM GOAL #4   Title Pt will report no falls over the last 2 weeks to improve safety during funtional mobility. Target date: 01/05/15.   Status On-going   PT SHORT TERM GOAL #5   Title Pt will improve BERG balance score to 46/56 to decrease falls risk. Target date: 01/05/15.   Status On-going           PT Long Term  Goals - 12/23/14 1636    PT LONG TERM GOAL #1   Title Pt will verbalize undestanding of falls prevention strategies to decrease falls risk. Target date: 02/02/15.   Status On-going   PT LONG TERM GOAL #2   Title Pt will ambulate 400' over even/uneven terrain with LRAD at MOD I level to improve functional mobility. Target date: 02/02/15.   Status On-going   PT LONG TERM GOAL #3   Title Pt will report she has signed up for water aerobics at fitness center to maintain gains made during PT and to improve quality of life. Target date: 02/02/15.   Status On-going   PT LONG TERM GOAL #4   Title Pt will report decrease in B hip pain during transfers and ambulation to <3/10 to improve quality of life and to perform ADLs with less pain. Target date: 02/02/15.   Status On-going   PT LONG TERM GOAL #5   Title Pt will perform TUG in </=18 seconds to decrease falls risk. Target date: 02/02/15.   Status On-going   PT LONG TERM GOAL #6   Title Pt will improve gait speed to >/=2.85ft/sec. to decease falls risk. Target date: 02/02/15.   Status On-going   PT LONG TERM GOAL #7   Title Pt will improve BERG score to >/=50/56 to decrease falls risk. Target date: 02/02/15.   Status On-going               Plan - 12/23/14 1634    Clinical Impression Statement Pt demonstrated progress, as she tolerated standing HEP. However, pt did require standing rest breaks due to fatigue and R hip pain (pt unable to rate). Pt did report R hip pain is chronic and improved with seated rest breaks. Pt would continue to benefit from skilled PT to improve safety during functional mobility.   Pt will benefit from skilled therapeutic intervention in order to improve on the following deficits Abnormal gait;Decreased endurance;Decreased safety awareness;Impaired flexibility;Decreased knowledge of use of DME;Pain;Decreased balance;Decreased strength;Decreased mobility;Other (comment)   Rehab Potential Good   PT Frequency 2x / week   PT  Duration 8 weeks   PT Treatment/Interventions Gait training;Neuromuscular re-education;ADLs/Self Care Home Management;Stair training;Biofeedback;Functional mobility training;Patient/family education;Therapeutic activities;Cryotherapy;Therapeutic exercise;Manual techniques;DME Instruction;Balance training   PT Next Visit Plan Progress dynamic gait training and balance training. Review HEP as needed.   Consulted and Agree with Plan of Care Patient        Problem List Patient Active Problem List   Diagnosis Date Noted  .  Bereavement 12/01/2014  . Psychosis 12/01/2014  . Abnormal blood findings 08/13/2014  . Reactive airway disease 08/11/2014  . History of cancer 08/11/2014  . OSA (obstructive sleep apnea) 08/11/2014  . Healthcare maintenance 07/17/2013  . Chronic kidney disease (CKD), stage III (moderate) 10/04/2012  . Schizophrenia, chronic condition 01/20/2012  . Postmenopausal atrophic vaginitis 11/01/2010  . Chronic diastolic heart failure 60/07/9322  . Allergic rhinitis 10/14/2009  . Obesity 05/14/2009  . Chronic pain syndrome 04/29/2008  . Anemia 09/25/2007  . Hyperlipidemia 09/03/2006  . Gout 09/03/2006  . Essential hypertension 09/03/2006  . GERD 09/03/2006  . Hx-TIA (transient ischemic attack) 05/13/2001    Ruqayya Ventress L 12/23/2014, 4:37 PM  [facility] [address] [place], [place], [address] Phone: [phone]   Fax:  [phone]     Geoffry Paradise, PT,DPT 12/23/2014 4:37 PM Phone: [phone] Fax: [phone]

## 2014-12-25 ENCOUNTER — Other Ambulatory Visit: Payer: Self-pay | Admitting: Internal Medicine

## 2014-12-28 NOTE — Telephone Encounter (Signed)
Pls phone in 

## 2014-12-28 NOTE — Telephone Encounter (Signed)
April or May appt with me pls

## 2014-12-29 ENCOUNTER — Ambulatory Visit: Payer: Medicare HMO | Attending: Internal Medicine

## 2014-12-29 DIAGNOSIS — R269 Unspecified abnormalities of gait and mobility: Secondary | ICD-10-CM | POA: Insufficient documentation

## 2014-12-29 DIAGNOSIS — Z96643 Presence of artificial hip joint, bilateral: Secondary | ICD-10-CM | POA: Diagnosis not present

## 2014-12-29 DIAGNOSIS — R296 Repeated falls: Secondary | ICD-10-CM

## 2014-12-29 DIAGNOSIS — R531 Weakness: Secondary | ICD-10-CM | POA: Insufficient documentation

## 2014-12-29 DIAGNOSIS — R5381 Other malaise: Secondary | ICD-10-CM

## 2014-12-29 NOTE — Therapy (Signed)
[facility] [address] [place], [place], [address] Phone: [phone]   Fax:  [phone]  Physical Therapy Treatment  Patient Details  Name: [name] MRN: [id number] Date of Birth: 15-Mar-1941 Referring Provider:  Bartholomew Crews, MD  Encounter Date: 12/29/2014      PT End of Session - 12/29/14 1557    Visit Number 4   Number of Visits 17   Date for PT Re-Evaluation 02/06/15   Authorization Type G-code every 10th visit.   PT Start Time 1400   PT Stop Time 1442   PT Time Calculation (min) 42 min   Equipment Utilized During Treatment Gait belt   Activity Tolerance Patient tolerated treatment well   Behavior During Therapy WFL for tasks assessed/performed      Past Medical History  Diagnosis Date  . History of cervical cancer 1972  . Ovarian cancer 1972    S/P oophorectomy  . GERD (gastroesophageal reflux disease)   . Gout   . Hypertension   . DJD (degenerative joint disease), multiple sites     Low back pain worst  . DDD (degenerative disc disease), lumbosacral   . Osteoarthritis of hip     bilateral hips  . Hx-TIA (transient ischemic attack) 05/13/2001    Right facial numbness  . Sensorineural hearing loss of both ears   . Mitral valve prolapse 10/27/1999    Subsequent 2D echo show normal mitral valve  . Chronic venous insufficiency   . Colon, diverticulosis Sept. 2011    outpatient colonoscopy by Dr. Cristina Gong.  Need record  . Hyperlipidemia   . Recurrent boils     History of MRSA skin infections with abscess  . Gout     Uric Acid level 4.2 on 300 allopurinol  . Depression   . Psychosis     Past Surgical History  Procedure Laterality Date  . Joint replacement      bilateral hip replacement  . Abdominal hysterectomy      1972  . Oophorectomy      1972 for ovarian cancer  . Lumbar disc surgery      L5-S1 7/07  . Cervical discectomy  7/07    C5-C6    There were no vitals  taken for this visit.  Visit Diagnosis:  Abnormality of gait  Frequent falls  Debility      Subjective Assessment - 12/29/14 1407    Symptoms Pt denied falls or changes since last visit. Pt reported she has been performing HEP as prescri bed, and that exercise is not too easy but just right. Pt reported her B hips feel tight.   Pertinent History Schizophrenia, B THA (approx. 10 years ago), chronic pain    Patient Stated Goals Reduce hip pain so she can get back to the gym for water aerobics, walk down steps safely, stop falling   Currently in Pain? No/denies                    [facility] Adult PT Treatment/Exercise - 12/29/14 1409    Ambulation/Gait   Ambulation/Gait Yes   Ambulation/Gait Assistance 5: Supervision   Ambulation/Gait Assistance Details Pt ambulated over even/uneven terrain without LOB; however pt did bumped into several objects and required cues to avoid objects.VC's to avoid the edge of the sidewalk for safety and to improve upright posture and B heel strike and stride length. pt required seated rest break after amb. 2/2 fatigue.   Ambulation Distance (Feet) --  380', 50', 75'   Assistive device Rollator   Gait Pattern Step-through pattern;Decreased dorsiflexion - left;Decreased dorsiflexion - right;Trendelenburg;Antalgic;Trunk flexed   Ambulation Surface Level;Unlevel   Exercises   Exercises Knee/Hip   Knee/Hip Exercises: Stretches   Active Hamstring Stretch 3 reps;30 seconds   Active Hamstring Stretch Limitations In seated position: B LEs. VC's and demonstration for technique and to keep back straigth.    Piriformis Stretch 30 seconds;4 reps   Piriformis Stretch Limitations Supine: B LEs. VC's for technique. Pt used sheet to assist bring knee to opposite shoulder. PT performed stretch for pt x1/LE, in order to demonstrate stretch for pt.                PT Education - 12/29/14 1557    Education provided Yes   Education Details Flexibility HEP.    Person(s) Educated Patient   Methods Explanation;Demonstration;Verbal cues;Tactile cues;Handout   Comprehension Verbalized understanding;Returned demonstration          PT Short Term Goals - 12/23/14 1636    PT SHORT TERM GOAL #1   Title Be independent in HEP to improve strength, endurance, and functional mobility. Target date: 01/05/15.   Status On-going   PT SHORT TERM GOAL #2   Title Assess BERG and write appropriate STG and LTG. Target date: 01/05/15.   Status Achieved   PT SHORT TERM GOAL #3   Title Pt will ambulate 300' over even terrain with LRAD at MOD I level to improve functional moblility. Target date: 01/05/15.   Status On-going   PT SHORT TERM GOAL #4   Title Pt will report no falls over the last 2 weeks to improve safety during funtional mobility. Target date: 01/05/15.   Status On-going   PT SHORT TERM GOAL #5   Title Pt will improve BERG balance score to 46/56 to decrease falls risk. Target date: 01/05/15.   Status On-going           PT Long Term Goals - 12/23/14 1636    PT LONG TERM GOAL #1   Title Pt will verbalize undestanding of falls prevention strategies to decrease falls risk. Target date: 02/02/15.   Status On-going   PT LONG TERM GOAL #2   Title Pt will ambulate 400' over even/uneven terrain with LRAD at MOD I level to improve functional mobility. Target date: 02/02/15.   Status On-going   PT LONG TERM GOAL #3   Title Pt will report she has signed up for water aerobics at fitness center to maintain gains made during PT and to improve quality of life. Target date: 02/02/15.   Status On-going   PT LONG TERM GOAL #4   Title Pt will report decrease in B hip pain during transfers and ambulation to <3/10 to improve quality of life and to perform ADLs with less pain. Target date: 02/02/15.   Status On-going   PT LONG TERM GOAL #5   Title Pt will perform TUG in </=18 seconds to decrease falls risk. Target date: 02/02/15.   Status On-going   PT LONG TERM GOAL #6   Title Pt  will improve gait speed to >/=2.71ft/sec. to decease falls risk. Target date: 02/02/15.   Status On-going   PT LONG TERM GOAL #7   Title Pt will improve BERG score to >/=50/56 to decrease falls risk. Target date: 02/02/15.   Status On-going               Plan - 12/29/14 1557    Clinical Impression Statement  Pt demonstrated progress, as she was able to ambulate longer distances over even/uneven terrain without LOB. Pt did require cues to avoid objects, as she bumped into 3 stools/chairs during ambulation indoors. Pt continues to require frequent cues to attend to task. Pt would continue to benefit from skilled PT to improve safety during functional mobility.   Pt will benefit from skilled therapeutic intervention in order to improve on the following deficits Abnormal gait;Decreased endurance;Decreased safety awareness;Impaired flexibility;Decreased knowledge of use of DME;Pain;Decreased balance;Decreased strength;Decreased mobility;Other (comment)   Rehab Potential Good   PT Frequency 2x / week   PT Duration 8 weeks   PT Treatment/Interventions Gait training;Neuromuscular re-education;ADLs/Self Care Home Management;Stair training;Biofeedback;Functional mobility training;Patient/family education;Therapeutic activities;Cryotherapy;Therapeutic exercise;Manual techniques;DME Instruction;Balance training   PT Next Visit Plan Progress dynamic gait training and balance training. Review HEP as needed.   Consulted and Agree with Plan of Care Patient        Problem List Patient Active Problem List   Diagnosis Date Noted  . Bereavement 12/01/2014  . Psychosis 12/01/2014  . Abnormal blood findings 08/13/2014  . Reactive airway disease 08/11/2014  . History of cancer 08/11/2014  . OSA (obstructive sleep apnea) 08/11/2014  . Healthcare maintenance 07/17/2013  . Chronic kidney disease (CKD), stage III (moderate) 10/04/2012  . Schizophrenia, chronic condition 01/20/2012  . Postmenopausal atrophic  vaginitis 11/01/2010  . Chronic diastolic heart failure 98/33/8250  . Allergic rhinitis 10/14/2009  . Obesity 05/14/2009  . Chronic pain syndrome 04/29/2008  . Anemia 09/25/2007  . Hyperlipidemia 09/03/2006  . Gout 09/03/2006  . Essential hypertension 09/03/2006  . GERD 09/03/2006  . Hx-TIA (transient ischemic attack) 05/13/2001    [name] L 12/29/2014, 4:01 PM  [facility] [address] [place], [place], [address] Phone: [phone]   Fax:  [phone]   Geoffry Paradise, PT,DPT 12/29/2014 4:01 PM Phone: [phone] Fax: [phone]

## 2014-12-29 NOTE — Patient Instructions (Signed)
Piriformis Stretch   Lying on back with both knees bent, pull right knee toward opposite shoulder. Hold __30__ seconds. Repeat with left leg. Repeat __3__ times per leg. Do _2-3___ sessions per day.  http://gt2.exer.us/258   Copyright  VHI. All rights reserved.  HIP: Hamstrings - Short Sitting   Rest leg on floor. Keep knee straight. Lift chest. Hold _30__ seconds. Repeat with other leg. _3__ reps per set per leg, _2-3__ sets per day, _7__ days per week  Copyright  VHI. All rights reserved.

## 2014-12-30 ENCOUNTER — Ambulatory Visit: Payer: Medicare HMO

## 2014-12-30 DIAGNOSIS — R296 Repeated falls: Secondary | ICD-10-CM

## 2014-12-30 DIAGNOSIS — R269 Unspecified abnormalities of gait and mobility: Secondary | ICD-10-CM

## 2014-12-30 DIAGNOSIS — R5381 Other malaise: Secondary | ICD-10-CM

## 2014-12-30 NOTE — Patient Instructions (Signed)
Heel Raise (Calf Strength / Balance)   Stand with hands on counter. Rise up on tiptoes.  Repeat _20__ times per session. Do_1__ sessions per day. Variation: Do without weights.  Copyright  VHI. All rights reserved.

## 2014-12-30 NOTE — Therapy (Signed)
[facility] [address] [place], [place], [address] Phone: [phone]   Fax:  [phone]  Physical Therapy Treatment  Patient Details  Name: [name] MRN: [id number] Date of Birth: 08/09/1941 Referring Provider:  Bartholomew Crews, MD  Encounter Date: 12/30/2014      PT End of Session - 12/30/14 1605    Visit Number 5   Number of Visits 17   Date for PT Re-Evaluation 02/06/15   Authorization Type G-code every 10th visit.   PT Start Time 1403   PT Stop Time 1444   PT Time Calculation (min) 41 min   Equipment Utilized During Treatment Gait belt   Activity Tolerance Patient tolerated treatment well   Behavior During Therapy WFL for tasks assessed/performed      Past Medical History  Diagnosis Date  . History of cervical cancer 1972  . Ovarian cancer 1972    S/P oophorectomy  . GERD (gastroesophageal reflux disease)   . Gout   . Hypertension   . DJD (degenerative joint disease), multiple sites     Low back pain worst  . DDD (degenerative disc disease), lumbosacral   . Osteoarthritis of hip     bilateral hips  . Hx-TIA (transient ischemic attack) 05/13/2001    Right facial numbness  . Sensorineural hearing loss of both ears   . Mitral valve prolapse 10/27/1999    Subsequent 2D echo show normal mitral valve  . Chronic venous insufficiency   . Colon, diverticulosis Sept. 2011    outpatient colonoscopy by Dr. Cristina Gong.  Need record  . Hyperlipidemia   . Recurrent boils     History of MRSA skin infections with abscess  . Gout     Uric Acid level 4.2 on 300 allopurinol  . Depression   . Psychosis     Past Surgical History  Procedure Laterality Date  . Joint replacement      bilateral hip replacement  . Abdominal hysterectomy      1972  . Oophorectomy      1972 for ovarian cancer  . Lumbar disc surgery      L5-S1 7/07  . Cervical discectomy  7/07    C5-C6    There were no vitals  taken for this visit.  Visit Diagnosis:  Abnormality of gait  Frequent falls  Debility      Subjective Assessment - 12/30/14 1406    Symptoms Pt denied falls since last visit. Pt reported she has a little muscle soreness in B hips after performing exercises yesterday.    Pertinent History Schizophrenia, B THA (approx. 10 years ago), chronic pain    Patient Stated Goals Reduce hip pain so she can get back to the gym for water aerobics, walk down steps safely, stop falling   Currently in Pain? No/denies                    [facility] Adult PT Treatment/Exercise - 12/30/14 1408    Ambulation/Gait   Ambulation/Gait Yes   Ambulation/Gait Assistance 5: Supervision   Ambulation/Gait Assistance Details Pt ambulated over even terrain while performing head turns. VC's to improve upright posture, heel strike, and stride length. PT adjusted rollator to appropriate height. Pt required two seated rest breaks during amb. 2/2 fatigue.   Ambulation Distance (Feet) --  345', 51' with rollator   Assistive device Rollator   Gait Pattern Step-through pattern;Decreased dorsiflexion - left;Decreased dorsiflexion - right;Trendelenburg;Antalgic;Trunk flexed  Decrease B toe off  Ambulation Surface Level;Indoor   Balance   Balance Assessed Yes   Dynamic Standing Balance   Dynamic Standing - Balance Support No upper extremity supported   Dynamic Standing - Level of Assistance 3: Mod assist;4: Min assist;Other (comment)  min guard   Dynamic Standing - Balance Activities Step ups;Eyes open;Forward lean/weight shifting   Dynamic Standing - Comments B LE x10/LE: step-up. Pt required extensive VC's and demonstration to improve ant/lat. weight shifting. Pt required mod A during one LOB during step down onto L LE. Otherwise, min guard to min A to maintain balance. B UE on counter during heel raises x20. VC's for technique.                PT Education - 12/30/14 1604    Education provided Yes    Education Details Heel raises HEP. PT educated pt on NOT performing step ups at home, due to impaired balance.   Person(s) Educated Patient   Methods Explanation;Demonstration;Verbal cues;Handout   Comprehension Verbalized understanding;Returned demonstration          PT Short Term Goals - 12/23/14 1636    PT SHORT TERM GOAL #1   Title Be independent in HEP to improve strength, endurance, and functional mobility. Target date: 01/05/15.   Status On-going   PT SHORT TERM GOAL #2   Title Assess BERG and write appropriate STG and LTG. Target date: 01/05/15.   Status Achieved   PT SHORT TERM GOAL #3   Title Pt will ambulate 300' over even terrain with LRAD at MOD I level to improve functional moblility. Target date: 01/05/15.   Status On-going   PT SHORT TERM GOAL #4   Title Pt will report no falls over the last 2 weeks to improve safety during funtional mobility. Target date: 01/05/15.   Status On-going   PT SHORT TERM GOAL #5   Title Pt will improve BERG balance score to 46/56 to decrease falls risk. Target date: 01/05/15.   Status On-going           PT Long Term Goals - 12/23/14 1636    PT LONG TERM GOAL #1   Title Pt will verbalize undestanding of falls prevention strategies to decrease falls risk. Target date: 02/02/15.   Status On-going   PT LONG TERM GOAL #2   Title Pt will ambulate 400' over even/uneven terrain with LRAD at MOD I level to improve functional mobility. Target date: 02/02/15.   Status On-going   PT LONG TERM GOAL #3   Title Pt will report she has signed up for water aerobics at fitness center to maintain gains made during PT and to improve quality of life. Target date: 02/02/15.   Status On-going   PT LONG TERM GOAL #4   Title Pt will report decrease in B hip pain during transfers and ambulation to <3/10 to improve quality of life and to perform ADLs with less pain. Target date: 02/02/15.   Status On-going   PT LONG TERM GOAL #5   Title Pt will perform TUG in </=18 seconds  to decrease falls risk. Target date: 02/02/15.   Status On-going   PT LONG TERM GOAL #6   Title Pt will improve gait speed to >/=2.1ft/sec. to decease falls risk. Target date: 02/02/15.   Status On-going   PT LONG TERM GOAL #7   Title Pt will improve BERG score to >/=50/56 to decrease falls risk. Target date: 02/02/15.   Status On-going  Plan - 12/30/14 1605    Clinical Impression Statement Pt demonstrated progress, as she was able to ambulate while performing head turns. Pt continues to require cues to attend to task. Pt was unable to perform cone taps with tall cones due to weakness and impaired balance but was able to step up on 4" step-up block. Pt experienced one extensive LOB during step ups which required mod A to maintain balance. Continue with POC.   Pt will benefit from skilled therapeutic intervention in order to improve on the following deficits Abnormal gait;Decreased endurance;Decreased safety awareness;Impaired flexibility;Decreased knowledge of use of DME;Pain;Decreased balance;Decreased strength;Decreased mobility;Other (comment)   Rehab Potential Good   PT Frequency 2x / week   PT Duration 8 weeks   PT Treatment/Interventions Gait training;Neuromuscular re-education;ADLs/Self Care Home Management;Stair training;Biofeedback;Functional mobility training;Patient/family education;Therapeutic activities;Cryotherapy;Therapeutic exercise;Manual techniques;DME Instruction;Balance training   PT Next Visit Plan Progress dynamic gait training and balance training.   Consulted and Agree with Plan of Care Patient        Problem List Patient Active Problem List   Diagnosis Date Noted  . Bereavement 12/01/2014  . Psychosis 12/01/2014  . Abnormal blood findings 08/13/2014  . Reactive airway disease 08/11/2014  . History of cancer 08/11/2014  . OSA (obstructive sleep apnea) 08/11/2014  . Healthcare maintenance 07/17/2013  . Chronic kidney disease (CKD), stage III  (moderate) 10/04/2012  . Schizophrenia, chronic condition 01/20/2012  . Postmenopausal atrophic vaginitis 11/01/2010  . Chronic diastolic heart failure 79/15/0413  . Allergic rhinitis 10/14/2009  . Obesity 05/14/2009  . Chronic pain syndrome 04/29/2008  . Anemia 09/25/2007  . Hyperlipidemia 09/03/2006  . Gout 09/03/2006  . Essential hypertension 09/03/2006  . GERD 09/03/2006  . Hx-TIA (transient ischemic attack) 05/13/2001    Loui Massenburg L 12/30/2014, 4:10 PM  [facility] [address] [place], [place], [address] Phone: [phone]   Fax:  [phone]     Geoffry Paradise, PT,DPT 12/30/2014 4:10 PM Phone: [phone] Fax: [phone]

## 2015-01-03 ENCOUNTER — Emergency Department (HOSPITAL_COMMUNITY): Payer: Medicare HMO

## 2015-01-03 ENCOUNTER — Emergency Department (HOSPITAL_COMMUNITY)
Admission: EM | Admit: 2015-01-03 | Discharge: 2015-01-03 | Disposition: A | Discharge status: Home / Self Care | Payer: Medicare HMO | Attending: Emergency Medicine | Admitting: Emergency Medicine

## 2015-01-03 ENCOUNTER — Encounter (HOSPITAL_COMMUNITY): Payer: Self-pay | Admitting: Physical Medicine and Rehabilitation

## 2015-01-03 DIAGNOSIS — Z872 Personal history of diseases of the skin and subcutaneous tissue: Secondary | ICD-10-CM | POA: Diagnosis not present

## 2015-01-03 DIAGNOSIS — R197 Diarrhea, unspecified: Secondary | ICD-10-CM | POA: Diagnosis not present

## 2015-01-03 DIAGNOSIS — Z7951 Long term (current) use of inhaled steroids: Secondary | ICD-10-CM | POA: Diagnosis not present

## 2015-01-03 DIAGNOSIS — K219 Gastro-esophageal reflux disease without esophagitis: Secondary | ICD-10-CM | POA: Insufficient documentation

## 2015-01-03 DIAGNOSIS — Z8673 Personal history of transient ischemic attack (TIA), and cerebral infarction without residual deficits: Secondary | ICD-10-CM | POA: Diagnosis not present

## 2015-01-03 DIAGNOSIS — E785 Hyperlipidemia, unspecified: Secondary | ICD-10-CM | POA: Diagnosis not present

## 2015-01-03 DIAGNOSIS — H9190 Unspecified hearing loss, unspecified ear: Secondary | ICD-10-CM | POA: Diagnosis not present

## 2015-01-03 DIAGNOSIS — Z8541 Personal history of malignant neoplasm of cervix uteri: Secondary | ICD-10-CM | POA: Diagnosis not present

## 2015-01-03 DIAGNOSIS — Z8543 Personal history of malignant neoplasm of ovary: Secondary | ICD-10-CM | POA: Insufficient documentation

## 2015-01-03 DIAGNOSIS — R059 Cough, unspecified: Secondary | ICD-10-CM

## 2015-01-03 DIAGNOSIS — Z79899 Other long term (current) drug therapy: Secondary | ICD-10-CM | POA: Insufficient documentation

## 2015-01-03 DIAGNOSIS — M1990 Unspecified osteoarthritis, unspecified site: Secondary | ICD-10-CM | POA: Diagnosis not present

## 2015-01-03 DIAGNOSIS — R05 Cough: Secondary | ICD-10-CM | POA: Diagnosis not present

## 2015-01-03 DIAGNOSIS — I10 Essential (primary) hypertension: Secondary | ICD-10-CM | POA: Diagnosis not present

## 2015-01-03 DIAGNOSIS — F329 Major depressive disorder, single episode, unspecified: Secondary | ICD-10-CM | POA: Insufficient documentation

## 2015-01-03 DIAGNOSIS — Z7982 Long term (current) use of aspirin: Secondary | ICD-10-CM | POA: Diagnosis not present

## 2015-01-03 DIAGNOSIS — Z87891 Personal history of nicotine dependence: Secondary | ICD-10-CM | POA: Insufficient documentation

## 2015-01-03 LAB — CBC WITH DIFFERENTIAL/PLATELET
Basophils Absolute: 0 10*3/uL (ref 0.0–0.1)
Basophils Relative: 0 % (ref 0–1)
Eosinophils Absolute: 0.3 10*3/uL (ref 0.0–0.7)
Eosinophils Relative: 5 % (ref 0–5)
HCT: 35.4 % — ABNORMAL LOW (ref 36.0–46.0)
Hemoglobin: 11.6 g/dL — ABNORMAL LOW (ref 12.0–15.0)
Lymphocytes Relative: 31 % (ref 12–46)
Lymphs Abs: 2 10*3/uL (ref 0.7–4.0)
MCH: 30.9 pg (ref 26.0–34.0)
MCHC: 32.8 g/dL (ref 30.0–36.0)
MCV: 94.4 fL (ref 78.0–100.0)
Monocytes Absolute: 0.3 10*3/uL (ref 0.1–1.0)
Monocytes Relative: 5 % (ref 3–12)
Neutro Abs: 3.8 10*3/uL (ref 1.7–7.7)
Neutrophils Relative %: 59 % (ref 43–77)
Platelets: 206 10*3/uL (ref 150–400)
RBC: 3.75 MIL/uL — ABNORMAL LOW (ref 3.87–5.11)
RDW: 15.7 % — ABNORMAL HIGH (ref 11.5–15.5)
WBC: 6.4 10*3/uL (ref 4.0–10.5)

## 2015-01-03 LAB — COMPREHENSIVE METABOLIC PANEL
ALT: 32 U/L (ref 0–35)
AST: 40 U/L — ABNORMAL HIGH (ref 0–37)
Albumin: 3.7 g/dL (ref 3.5–5.2)
Alkaline Phosphatase: 130 U/L — ABNORMAL HIGH (ref 39–117)
Anion gap: 8 (ref 5–15)
BUN: 18 mg/dL (ref 6–23)
CO2: 27 mmol/L (ref 19–32)
Calcium: 8.9 mg/dL (ref 8.4–10.5)
Chloride: 104 mmol/L (ref 96–112)
Creatinine, Ser: 1.46 mg/dL — ABNORMAL HIGH (ref 0.50–1.10)
GFR calc Af Amer: 40 mL/min — ABNORMAL LOW (ref >90)
GFR calc non Af Amer: 34 mL/min — ABNORMAL LOW (ref >90)
Glucose, Bld: 92 mg/dL (ref 70–99)
Potassium: 3.4 mmol/L — ABNORMAL LOW (ref 3.5–5.1)
Sodium: 139 mmol/L (ref 135–145)
Total Bilirubin: 0.6 mg/dL (ref 0.3–1.2)
Total Protein: 6.5 g/dL (ref 6.0–8.3)

## 2015-01-03 LAB — URINALYSIS, ROUTINE W REFLEX MICROSCOPIC
Bilirubin Urine: NEGATIVE
Glucose, UA: NEGATIVE mg/dL
Hgb urine dipstick: NEGATIVE
Ketones, ur: NEGATIVE mg/dL
Nitrite: NEGATIVE
Protein, ur: NEGATIVE mg/dL
Specific Gravity, Urine: 1.024 (ref 1.005–1.030)
Urobilinogen, UA: 0.2 mg/dL (ref 0.0–1.0)
pH: 5.5 (ref 5.0–8.0)

## 2015-01-03 LAB — CLOSTRIDIUM DIFFICILE BY PCR: Toxigenic C. Difficile by PCR: NEGATIVE

## 2015-01-03 LAB — URINE MICROSCOPIC-ADD ON

## 2015-01-03 LAB — POC OCCULT BLOOD, ED: Fecal Occult Bld: NEGATIVE

## 2015-01-03 LAB — BRAIN NATRIURETIC PEPTIDE: B Natriuretic Peptide: 15.7 pg/mL (ref 0.0–100.0)

## 2015-01-03 MED ORDER — SODIUM CHLORIDE 0.9 % IV BOLUS (SEPSIS)
1000.0000 mL | Freq: Once | INTRAVENOUS | Status: AC
  Administered 2015-01-03: 1000 mL via INTRAVENOUS

## 2015-01-03 NOTE — ED Notes (Signed)
Pt aware of urine sample needed. Pt unable to go at this time. 

## 2015-01-03 NOTE — Discharge Instructions (Signed)
If you develop pain, vomiting, worsening diarrhea, or weakness... Please return to the Emergency Department to make sure you have not developed any other problems.  Diarrhea Diarrhea is frequent loose and watery bowel movements. It can cause you to feel weak and dehydrated. Dehydration can cause you to become tired and thirsty, have a dry mouth, and have decreased urination that often is dark yellow. Diarrhea is a sign of another problem, most often an infection that will not last long. In most cases, diarrhea typically lasts 2-3 days. However, it can last longer if it is a sign of something more serious. It is important to treat your diarrhea as directed by your caregiver to lessen or prevent future episodes of diarrhea. CAUSES  Some common causes include:  Gastrointestinal infections caused by viruses, bacteria, or parasites.  Food poisoning or food allergies.  Certain medicines, such as antibiotics, chemotherapy, and laxatives.  Artificial sweeteners and fructose.  Digestive disorders. HOME CARE INSTRUCTIONS  Ensure adequate fluid intake (hydration): Have 1 cup (8 oz) of fluid for each diarrhea episode. Avoid fluids that contain simple sugars or sports drinks, fruit juices, whole milk products, and sodas. Your urine should be clear or pale yellow if you are drinking enough fluids. Hydrate with an oral rehydration solution that you can purchase at pharmacies, retail stores, and online. You can prepare an oral rehydration solution at home by mixing the following ingredients together:   - tsp table salt.   tsp baking soda.   tsp salt substitute containing potassium chloride.  1  tablespoons sugar.  1 L (34 oz) of water.  Certain foods and beverages may increase the speed at which food moves through the gastrointestinal (GI) tract. These foods and beverages should be avoided and include:  Caffeinated and alcoholic beverages.  High-fiber foods, such as raw fruits and vegetables,  nuts, seeds, and whole grain breads and cereals.  Foods and beverages sweetened with sugar alcohols, such as xylitol, sorbitol, and mannitol.  Some foods may be well tolerated and may help thicken stool including:  Starchy foods, such as rice, toast, pasta, low-sugar cereal, oatmeal, grits, baked potatoes, crackers, and bagels.  Bananas.  Applesauce.  Add probiotic-rich foods to help increase healthy bacteria in the GI tract, such as yogurt and fermented milk products.  Wash your hands well after each diarrhea episode.  Only take over-the-counter or prescription medicines as directed by your caregiver.  Take a warm bath to relieve any burning or pain from frequent diarrhea episodes. SEEK IMMEDIATE MEDICAL CARE IF:   You are unable to keep fluids down.  You have persistent vomiting.  You have blood in your stool, or your stools are black and tarry.  You do not urinate in 6-8 hours, or there is only a small amount of very dark urine.  You have abdominal pain that increases or localizes.  You have weakness, dizziness, confusion, or light-headedness.  You have a severe headache.  Your diarrhea gets worse or does not get better.  You have a fever or persistent symptoms for more than 2-3 days.  You have a fever and your symptoms suddenly get worse. MAKE SURE YOU:   Understand these instructions.  Will watch your condition.  Will get help right away if you are not doing well or get worse. Document Released: 10/06/2002 Document Revised: 03/02/2014 Document Reviewed: 06/23/2012 [facility] Patient Information 2015 [place], [place]. This information is not intended to replace advice given to you by your health care provider. Make sure you discuss  any questions you have with your health care provider. ° °

## 2015-01-03 NOTE — ED Notes (Signed)
Pt presents to department via [facility] for evaluation of diarrhea. Pt states she ate BBQ chicken on Friday, diarrhea started shortly after. Reports stomach cramping and multiple liquid stools today. No nausea/vomiting. Pt is alert and oriented x4. CBG 92.

## 2015-01-03 NOTE — ED Provider Notes (Signed)
CSN: [id number]     Arrival date & time 01/03/15  1114 History   First MD Initiated Contact with Patient 01/03/15 1117     Chief Complaint  Patient presents with  . Diarrhea     (Consider location/radiation/quality/duration/timing/severity/associated sxs/prior Treatment) HPI    PCP: BUTCHER,ELIZABETH, MD Blood pressure 111/50, pulse 81, temperature 98.6 F (37 C), resp. rate 20, height 5\' 5"  (1.651 m), weight 215 lb (97.523 kg), SpO2 92 %.  [name] is a 74 y.o.female with a significant PMH of cervical cancer, ovarian cancer (1972), GERD, gout, hypertension, DJD, osteoarthritis, TIA, mitral valve prolapse, recurrent boils, depression, psychosis  presents to the ER with complaints of diarrhea since Friday (3 days). She had BBQ chicken and started with diarrhea shortly after. She has had some mild cramping. She reports 3 episodes this morning before 10 am. Her stools started off as the normal brown but then turned green. She now reports that they are dark. She denies any bright red blood. No abdominal pain, nausea, vomiting or weakness. She drank only 9 ounces of water yesterday, due to the rest of her water being frozen. The patient is well appearing, normal vitals signs.   Past Medical History  Diagnosis Date  . History of cervical cancer 1972  . Ovarian cancer 1972    S/P oophorectomy  . GERD (gastroesophageal reflux disease)   . Gout   . Hypertension   . DJD (degenerative joint disease), multiple sites     Low back pain worst  . DDD (degenerative disc disease), lumbosacral   . Osteoarthritis of hip     bilateral hips  . Hx-TIA (transient ischemic attack) 05/13/2001    Right facial numbness  . Sensorineural hearing loss of both ears   . Mitral valve prolapse 10/27/1999    Subsequent 2D echo show normal mitral valve  . Chronic venous insufficiency   . Colon, diverticulosis Sept. 2011    outpatient colonoscopy by Dr. Cristina Gong.  Need record  . Hyperlipidemia   . Recurrent  boils     History of MRSA skin infections with abscess  . Gout     Uric Acid level 4.2 on 300 allopurinol  . Depression   . Psychosis    Past Surgical History  Procedure Laterality Date  . Joint replacement      bilateral hip replacement  . Abdominal hysterectomy      1972  . Oophorectomy      1972 for ovarian cancer  . Lumbar disc surgery      L5-S1 7/07  . Cervical discectomy  7/07    C5-C6   Family History  Problem Relation Age of Onset  . Diabetes Mother   . Heart disease Mother   . Hearing loss Mother   . Stroke Mother   . Schizophrenia Maternal Grandmother   . Diabetes Maternal Grandfather   . Other Brother     brother died in mental health [facility]   History  Substance Use Topics  . Smoking status: Former Smoker -- 0.10 packs/day    Types: Cigarettes    Quit date: 04/08/2013  . Smokeless tobacco: Never Used  . Alcohol Use: No   OB History    No data available     Review of Systems  10 Systems reviewed and are negative for acute change except as noted in the HPI.    Allergies  Aspirin and Sulfamethoxazole-trimethoprim  Home Medications   Prior to Admission medications   Medication Sig Start Date End  Date Taking? Authorizing Provider  albuterol (PROVENTIL HFA;VENTOLIN HFA) 108 (90 BASE) MCG/ACT inhaler Inhale 2 puffs into the lungs every 6 (six) hours as needed for wheezing or shortness of breath. 04/16/14  Yes Clinton Gallant, MD  allopurinol (ZYLOPRIM) 300 MG tablet TAKE 1 TABLET BY MOUTH DAILY 07/31/14  Yes Bartholomew Crews, MD  amLODipine (NORVASC) 5 MG tablet TAKE 2 TABLETS BY MOUTH EVERY DAY 12/28/14   Bartholomew Crews, MD  aspirin EC 81 MG tablet Take 81 mg by mouth daily.    Historical Provider, MD  atorvastatin (LIPITOR) 40 MG tablet Take 1 tablet (40 mg total) by mouth daily. 09/03/14   Bartholomew Crews, MD  Biotin (BIOTIN 5000) 5 MG CAPS Take 1 capsule by mouth daily.      Historical Provider, MD  buPROPion (WELLBUTRIN XL) 300 MG 24 hr  tablet Take 1 tablet (300 mg total) by mouth every morning. 11/05/14   Kathlee Nations, MD  Calcium Carbonate-Vitamin D (CALCIUM-VITAMIN D) 500-200 MG-UNIT per tablet Take 1 tablet by mouth daily.      Historical Provider, MD  docusate sodium (COLACE) 100 MG capsule Take 1 capsule (100 mg total) by mouth every 12 (twelve) hours. Patient not taking: Reported on 12/08/2014 12/21/13   Mariea Clonts, MD  fluconazole (DIFLUCAN) 150 MG tablet Take 1 tablet (150 mg total) by mouth daily. 11/19/14   Bartholomew Crews, MD  fluocinonide cream (LIDEX) 0.05 % APPLY TO AFFECTED AREA TWICE DAILY    Madilyn Fireman, MD  fluticasone (FLONASE) 50 MCG/ACT nasal spray INHALE 2 SPRAYS IN EACH NOSTRIL ONCE DAILY 12/01/14   Bartholomew Crews, MD  furosemide (LASIX) 20 MG tablet TAKE 1 TABLET (20 MG TOTAL) BY MOUTH DAILY. OKAY TO USE ONE MORE TABLET DAILY FOR INCREASED SWELLING AS NEEDED.    Madilyn Fireman, MD  haloperidol (HALDOL) 1 MG tablet Take 1 tablet (1 mg total) by mouth at bedtime. 11/05/14   Kathlee Nations, MD  losartan (COZAAR) 100 MG tablet TAKE 1 TABLET BY MOUTH DAILY    Madilyn Fireman, MD  oxybutynin (DITROPAN-XL) 5 MG 24 hr tablet TAKE ONE TABLET BY MOUTH AT BEDTIME 09/22/14   Bartholomew Crews, MD  pantoprazole (PROTONIX) 40 MG tablet Take 1 tablet (40 mg total) by mouth daily. 12/01/14   Bartholomew Crews, MD  polyethylene glycol (MIRALAX / GLYCOLAX) packet MIX 17 GRAMS  (1 CAPFUL) WITH 8 OUNCES OF FLUID AND  TAKE BY MOUTH DAILY AS NEEDED 07/31/14   Bartholomew Crews, MD  potassium chloride (MICRO-K) 10 MEQ CR capsule Take 1 capsule (10 mEq total) by mouth daily. 10/21/14   Karren Cobble, MD  traMADol (ULTRAM) 50 MG tablet TAKE 1 TABLET BY MOUTH EVERY 12 HOURS AS NEEDED 12/28/14   Bartholomew Crews, MD   BP 120/94 mmHg  Pulse 81  Temp(Src) 98.6 F (37 C)  Resp 20  Ht 5\' 5"  (1.651 m)  Wt 215 lb (97.523 kg)  BMI 35.78 kg/m2  SpO2 97% Physical Exam  Constitutional: She appears well-developed and  well-nourished. No distress.  HENT:  Head: Normocephalic and atraumatic.  Eyes: Pupils are equal, round, and reactive to light.  Neck: Normal range of motion. Neck supple.  Cardiovascular: Normal rate and regular rhythm.   Pulmonary/Chest: Effort normal. She has no decreased breath sounds. She has no wheezes. She has no rales.  Abdominal: Soft. Bowel sounds are normal. She exhibits no distension. There is no tenderness. There is no rigidity, no  rebound and no guarding.  Musculoskeletal:  No lower extremity swelling.  Neurological: She is alert.  Skin: Skin is warm and dry.  Nursing note and vitals reviewed.   ED Course  Procedures (including critical care time) Labs Review Labs Reviewed  COMPREHENSIVE METABOLIC PANEL - Abnormal; Notable for the following:    Potassium 3.4 (*)    Creatinine, Ser 1.46 (*)    AST 40 (*)    Alkaline Phosphatase 130 (*)    GFR calc non Af Amer 34 (*)    GFR calc Af Amer 40 (*)    All other components within normal limits  CBC WITH DIFFERENTIAL/PLATELET - Abnormal; Notable for the following:    RBC 3.75 (*)    Hemoglobin 11.6 (*)    HCT 35.4 (*)    RDW 15.7 (*)    All other components within normal limits  URINALYSIS, ROUTINE W REFLEX MICROSCOPIC - Abnormal; Notable for the following:    Color, Urine AMBER (*)    APPearance CLOUDY (*)    Leukocytes, UA SMALL (*)    All other components within normal limits  URINE MICROSCOPIC-ADD ON - Abnormal; Notable for the following:    Squamous Epithelial / LPF MANY (*)    Bacteria, UA MANY (*)    All other components within normal limits  STOOL CULTURE  CLOSTRIDIUM DIFFICILE BY PCR  BRAIN NATRIURETIC PEPTIDE  OCCULT BLOOD X 1 CARD TO LAB, STOOL  POC OCCULT BLOOD, ED    Imaging Review Dg Chest 2 View  01/03/2015   CLINICAL DATA:  Cough.  EXAM: CHEST  2 VIEW  COMPARISON:  06/19/2013  FINDINGS: Decreased lung volumes are noted, however both lungs remain clear. No evidence pleural effusion. Mild  cardiomegaly is stable as well as ectasia of the thoracic aorta. Thoracic spine degenerative changes again noted.  IMPRESSION: Decreased lung volumes.  No acute findings.   Electronically Signed   By: Earle Gell M.D.   On: 01/03/2015 13:54   Dg Abd 2 Views  01/03/2015   CLINICAL DATA:  Diarrhea.  Initial encounter.  EXAM: ABDOMEN - 2 VIEW  COMPARISON:  None.  FINDINGS: Air-fluid levels are present within small bowel. Small bowel loops are mildly dilated. Cholecystectomy clips are present in the right upper quadrant. No intra-abdominal free air on the upright view. Bilateral total hip arthroplasties. No gas is identified within the rectosigmoid.  IMPRESSION: Mildly dilated loops of small bowel with air-fluid levels. A few gas distended loops of colon. The appearance is nonspecific and could be associated with early/partial obstruction, enteritis or ileus.   Electronically Signed   By: Dereck Ligas M.D.   On: 01/03/2015 14:56     EKG Interpretation None      MDM   Final diagnoses:  Cough  Diarrhea    Medications  sodium chloride 0.9 % bolus 1,000 mL (0 mLs Intravenous Stopped 01/03/15 1624)   Denies nausea, vomiting, abdominal pain, chest pain, SOB, abdominal distention, weakness. Pt otherwise feels well. Patients lab work is reassuring, her chest xray is non acute and her abdominal xray confirms my suspicion for enteritis.   She has received 1 liter of fluids and has had some very small episode of diarrhea in the ED. Hemoccult negative. I discussed the case with Dr. Jeanell Sparrow, the patient has been given VERY strict return to ED precautions.  She is agreeable and happy to go home. She will call Dr. Lynnae January tomorrow and arrange f/u. Stool cultures and c.diff pending.  74 y.o.[name]  P Gerdeman's evaluation in the Emergency Department is complete. It has been determined that no acute conditions requiring further emergency intervention are present at this time. The patient/guardian have been advised of  the diagnosis and plan. We have discussed signs and symptoms that warrant return to the ED, such as changes or worsening in symptoms.  Vital signs are stable at discharge. Filed Vitals:   01/03/15 1615  BP: 120/94  Pulse: 81  Temp:   Resp:     Patient/guardian has voiced understanding and agreed to follow-up with the PCP or specialist.   Linus Mako, PA-C 01/03/15 1633  Pattricia Boss, MD 01/04/15 1413

## 2015-01-05 ENCOUNTER — Ambulatory Visit: Payer: Medicare HMO | Admitting: Physical Therapy

## 2015-01-05 ENCOUNTER — Encounter: Payer: Self-pay | Admitting: Physical Therapy

## 2015-01-05 DIAGNOSIS — R269 Unspecified abnormalities of gait and mobility: Secondary | ICD-10-CM | POA: Diagnosis not present

## 2015-01-05 DIAGNOSIS — R296 Repeated falls: Secondary | ICD-10-CM

## 2015-01-06 ENCOUNTER — Ambulatory Visit: Payer: Medicare HMO

## 2015-01-06 DIAGNOSIS — R5381 Other malaise: Secondary | ICD-10-CM

## 2015-01-06 DIAGNOSIS — R269 Unspecified abnormalities of gait and mobility: Secondary | ICD-10-CM | POA: Diagnosis not present

## 2015-01-06 DIAGNOSIS — R296 Repeated falls: Secondary | ICD-10-CM

## 2015-01-06 NOTE — Therapy (Signed)
[facility] [address] [place], [place], [address] Phone: [phone]   Fax:  [phone]  Physical Therapy Treatment  Patient Details  Name: [name] MRN: [id number] Date of Birth: November 25, 1940 Referring Provider:  Bartholomew Crews, MD  Encounter Date: 01/06/2015      PT End of Session - 01/06/15 1654    Visit Number 7   Number of Visits 17   Date for PT Re-Evaluation 02/06/15   Authorization Type G-code every 10th visit.   PT Start Time 1449   PT Stop Time 1528   PT Time Calculation (min) 39 min   Equipment Utilized During Treatment Gait belt   Activity Tolerance Patient tolerated treatment well   Behavior During Therapy WFL for tasks assessed/performed      Past Medical History  Diagnosis Date  . History of cervical cancer 1972  . Ovarian cancer 1972    S/P oophorectomy  . GERD (gastroesophageal reflux disease)   . Gout   . Hypertension   . DJD (degenerative joint disease), multiple sites     Low back pain worst  . DDD (degenerative disc disease), lumbosacral   . Osteoarthritis of hip     bilateral hips  . Hx-TIA (transient ischemic attack) 05/13/2001    Right facial numbness  . Sensorineural hearing loss of both ears   . Mitral valve prolapse 10/27/1999    Subsequent 2D echo show normal mitral valve  . Chronic venous insufficiency   . Colon, diverticulosis Sept. 2011    outpatient colonoscopy by Dr. Cristina Gong.  Need record  . Hyperlipidemia   . Recurrent boils     History of MRSA skin infections with abscess  . Gout     Uric Acid level 4.2 on 300 allopurinol  . Depression   . Psychosis     Past Surgical History  Procedure Laterality Date  . Joint replacement      bilateral hip replacement  . Abdominal hysterectomy      1972  . Oophorectomy      1972 for ovarian cancer  . Lumbar disc surgery      L5-S1 7/07  . Cervical discectomy  7/07    C5-C6    There were no vitals  taken for this visit.  Visit Diagnosis:  Abnormality of gait  Frequent falls  Debility      Subjective Assessment - 01/06/15 1452    Symptoms Pt denied falls since last visit. Pt reported she feels better, and believes that the stretches are helping her hips.  Pt reported she feels 100% better since beginning PT, and reported a+7 on the [facility]   Pertinent History Schizophrenia, B THA (approx. 10 years ago), chronic pain    Patient Stated Goals Reduce hip pain so she can get back to the gym for water aerobics, walk down steps safely, stop falling   Currently in Pain? No/denies      Neuro re-ed: reviewed balance HEP.   Balance activities performed in corner with chair in front of pt for safety, B LEs, 1-2 sets with 10-30second holds: feet together with head turns, feet together with eyes open/closed, tandem stance, modified tandem stance, single leg stance (with UE support). VC's for technique. Pt did not require rest breaks during balance activities. Pt able to progress to tandem stance vs. Modified tandem stance.  Therex: Pt performed therex standing at counter with 1-2 UE support and min guard to supervision: VC's for technique. Pt did not require any  seated rest breaks, indicating improved endurance. -B hip ext. 1x10. -B hip abd. 1x10. -B knee flexion 1x10. -B hip marches along counter 4x7'. -Supine B piriformis stretch 2x30sec. Pt did not required sheet to bring knee to chest. -Seated B hamstring stretch 2x30sec. VC's to keep knees fully extended.                     PT Education - 01/06/15 1653    Education provided Yes   Education Details PT encouraged pt to increase hip abd. reps to 10 vs. 5 due to progress.   Person(s) Educated Patient   Methods Explanation;Demonstration   Comprehension Verbalized understanding;Returned demonstration          PT Short Term Goals - 01/06/15 1656    PT SHORT TERM GOAL #1   Title Be independent in HEP to improve  strength, endurance, and functional mobility. Target date: 01/05/15.   Status Partially Met   PT SHORT TERM GOAL #2   Title Assess BERG and write appropriate STG and LTG. Target date: 01/05/15.   Status Achieved   PT SHORT TERM GOAL #3   Title Pt will ambulate 300' over even terrain with LRAD at MOD I level to improve functional moblility. Target date: 01/05/15.   Status Achieved   PT SHORT TERM GOAL #4   Title Pt will report no falls over the last 2 weeks to improve safety during funtional mobility. Target date: 01/05/15.   Status Achieved  Per patient report   PT SHORT TERM GOAL #5   Title Pt will improve BERG balance score to 46/56 to decrease falls risk. Target date: 01/05/15.   Status Achieved           PT Long Term Goals - 12/23/14 1636    PT LONG TERM GOAL #1   Title Pt will verbalize undestanding of falls prevention strategies to decrease falls risk. Target date: 02/02/15.   Status On-going   PT LONG TERM GOAL #2   Title Pt will ambulate 400' over even/uneven terrain with LRAD at MOD I level to improve functional mobility. Target date: 02/02/15.   Status On-going   PT LONG TERM GOAL #3   Title Pt will report she has signed up for water aerobics at fitness center to maintain gains made during PT and to improve quality of life. Target date: 02/02/15.   Status On-going   PT LONG TERM GOAL #4   Title Pt will report decrease in B hip pain during transfers and ambulation to <3/10 to improve quality of life and to perform ADLs with less pain. Target date: 02/02/15.   Status On-going   PT LONG TERM GOAL #5   Title Pt will perform TUG in </=18 seconds to decrease falls risk. Target date: 02/02/15.   Status On-going   PT LONG TERM GOAL #6   Title Pt will improve gait speed to >/=2.31f/sec. to decease falls risk. Target date: 02/02/15.   Status On-going   PT LONG TERM GOAL #7   Title Pt will improve BERG score to >/=50/56 to decrease falls risk. Target date: 02/02/15.   Status On-going                Plan - 01/06/15 1654    Clinical Impression Statement Pt partially met HEP STG as she required min cues for technique and was able to progress HEP, indicating progress. Pt reported +7 on GROC, indicating she feels a great deal better. Continue with POC.   Pt  will benefit from skilled therapeutic intervention in order to improve on the following deficits Abnormal gait;Decreased endurance;Decreased safety awareness;Impaired flexibility;Decreased knowledge of use of DME;Pain;Decreased balance;Decreased strength;Decreased mobility;Other (comment)   Rehab Potential Good   PT Frequency 2x / week   PT Duration 8 weeks   PT Treatment/Interventions Gait training;Neuromuscular re-education;ADLs/Self Care Home Management;Stair training;Biofeedback;Functional mobility training;Patient/family education;Therapeutic activities;Cryotherapy;Therapeutic exercise;Manual techniques;DME Instruction;Balance training   PT Next Visit Plan Update hip abd. therex to 10 reps vs. 5 and add tandem stance to HEP vs. Modified tandem. Continue dynamic balance and gait.   Consulted and Agree with Plan of Care Patient        Problem List Patient Active Problem List   Diagnosis Date Noted  . Bereavement 12/01/2014  . Psychosis 12/01/2014  . Abnormal blood findings 08/13/2014  . Reactive airway disease 08/11/2014  . History of cancer 08/11/2014  . OSA (obstructive sleep apnea) 08/11/2014  . Healthcare maintenance 07/17/2013  . Chronic kidney disease (CKD), stage III (moderate) 10/04/2012  . Schizophrenia, chronic condition 01/20/2012  . Postmenopausal atrophic vaginitis 11/01/2010  . Chronic diastolic heart failure 16/38/4665  . Allergic rhinitis 10/14/2009  . Obesity 05/14/2009  . Chronic pain syndrome 04/29/2008  . Anemia 09/25/2007  . Hyperlipidemia 09/03/2006  . Gout 09/03/2006  . Essential hypertension 09/03/2006  . GERD 09/03/2006  . Hx-TIA (transient ischemic attack) 05/13/2001     Kynley Metzger L 01/06/2015, 4:57 PM  [facility] [address] [place], [place], [address] Phone: [phone]   Fax:  [phone]    Geoffry Paradise, PT,DPT 01/06/2015 4:57 PM Phone: [phone] Fax: [phone]

## 2015-01-06 NOTE — Therapy (Signed)
[facility] [address] [place], [place], [address] Phone: [phone]   Fax:  [phone]  Physical Therapy Treatment  Patient Details  Name: [name] MRN: [id number] Date of Birth: 1941/05/18 Referring Provider:  Bartholomew Crews, MD  Encounter Date: 01/05/2015      PT End of Session - 01/06/15 0959    Visit Number 6   Number of Visits 17   Date for PT Re-Evaluation 02/06/15   Authorization Type G-code every 10th visit.   PT Start Time 1404   PT Stop Time 1447   PT Time Calculation (min) 43 min   Equipment Utilized During Treatment Gait belt   Activity Tolerance Patient tolerated treatment well   Behavior During Therapy WFL for tasks assessed/performed      Past Medical History  Diagnosis Date  . History of cervical cancer 1972  . Ovarian cancer 1972    S/P oophorectomy  . GERD (gastroesophageal reflux disease)   . Gout   . Hypertension   . DJD (degenerative joint disease), multiple sites     Low back pain worst  . DDD (degenerative disc disease), lumbosacral   . Osteoarthritis of hip     bilateral hips  . Hx-TIA (transient ischemic attack) 05/13/2001    Right facial numbness  . Sensorineural hearing loss of both ears   . Mitral valve prolapse 10/27/1999    Subsequent 2D echo show normal mitral valve  . Chronic venous insufficiency   . Colon, diverticulosis Sept. 2011    outpatient colonoscopy by Dr. Cristina Gong.  Need record  . Hyperlipidemia   . Recurrent boils     History of MRSA skin infections with abscess  . Gout     Uric Acid level 4.2 on 300 allopurinol  . Depression   . Psychosis     Past Surgical History  Procedure Laterality Date  . Joint replacement      bilateral hip replacement  . Abdominal hysterectomy      1972  . Oophorectomy      1972 for ovarian cancer  . Lumbar disc surgery      L5-S1 7/07  . Cervical discectomy  7/07    C5-C6    There were no vitals  taken for this visit.  Visit Diagnosis:  Abnormality of gait  Frequent falls      Subjective Assessment - 01/05/15 1415    Symptoms Pt had to go to ED on Sunday for loose stools x 3 days.  Denies falls since last visit.  Was sore last week after exercises.   Currently in Pain? No/denies                    [facility] Adult PT Treatment/Exercise - 01/06/15 0001    Ambulation/Gait   Ambulation/Gait Yes   Ambulation/Gait Assistance 6: Modified independent (Device/Increase time)   Ambulation Distance (Feet) 330 Feet   Assistive device Rollator   Gait Pattern Step-through pattern;Trunk flexed;Decreased dorsiflexion - right;Decreased dorsiflexion - left   Standardized Balance Assessment   Standardized Balance Assessment Berg Balance Test   Berg Balance Test   Sit to Stand Able to stand without using hands and stabilize independently   Standing Unsupported Able to stand safely 2 minutes   Sitting with Back Unsupported but Feet Supported on Floor or Stool Able to sit safely and securely 2 minutes   Stand to Sit Sits safely with minimal use of hands   Transfers Able to transfer safely, minor  use of hands   Standing Unsupported with Eyes Closed Able to stand 10 seconds safely   Standing Ubsupported with Feet Together Able to place feet together independently and stand 1 minute safely   From Standing, Reach Forward with Outstretched Arm Can reach confidently >25 cm (10")   From Standing Position, Pick up Object from Floor Able to pick up shoe safely and easily   From Standing Position, Turn to Look Behind Over each Shoulder Looks behind one side only/other side shows less weight shift   Turn 360 Degrees Able to turn 360 degrees safely but slowly   Standing Unsupported, Alternately Place Feet on Step/Stool Able to stand independently and complete 8 steps >20 seconds   Standing Unsupported, One Foot in Front Able to plae foot ahead of the other independently and hold 30 seconds    Standing on One Leg Able to lift leg independently and hold > 10 seconds   Total Score 51                  PT Short Term Goals - 01/06/15 1002    PT SHORT TERM GOAL #1   Title Be independent in HEP to improve strength, endurance, and functional mobility. Target date: 01/05/15.   Status On-going   PT SHORT TERM GOAL #2   Title Assess BERG and write appropriate STG and LTG. Target date: 01/05/15.   Status Achieved   PT SHORT TERM GOAL #3   Title Pt will ambulate 300' over even terrain with LRAD at MOD I level to improve functional moblility. Target date: 01/05/15.   Status Achieved   PT SHORT TERM GOAL #4   Title Pt will report no falls over the last 2 weeks to improve safety during funtional mobility. Target date: 01/05/15.   Status Achieved  Per patient report   PT SHORT TERM GOAL #5   Title Pt will improve BERG balance score to 46/56 to decrease falls risk. Target date: 01/05/15.   Status Achieved           PT Long Term Goals - 12/23/14 1636    PT LONG TERM GOAL #1   Title Pt will verbalize undestanding of falls prevention strategies to decrease falls risk. Target date: 02/02/15.   Status On-going   PT LONG TERM GOAL #2   Title Pt will ambulate 400' over even/uneven terrain with LRAD at MOD I level to improve functional mobility. Target date: 02/02/15.   Status On-going   PT LONG TERM GOAL #3   Title Pt will report she has signed up for water aerobics at fitness center to maintain gains made during PT and to improve quality of life. Target date: 02/02/15.   Status On-going   PT LONG TERM GOAL #4   Title Pt will report decrease in B hip pain during transfers and ambulation to <3/10 to improve quality of life and to perform ADLs with less pain. Target date: 02/02/15.   Status On-going   PT LONG TERM GOAL #5   Title Pt will perform TUG in </=18 seconds to decrease falls risk. Target date: 02/02/15.   Status On-going   PT LONG TERM GOAL #6   Title Pt will improve gait speed to  >/=2.63f/sec. to decease falls risk. Target date: 02/02/15.   Status On-going   PT LONG TERM GOAL #7   Title Pt will improve BERG score to >/=50/56 to decrease falls risk. Target date: 02/02/15.   Status On-going  Plan - 01/06/15 1000    Clinical Impression Statement Pt met STG # 2,3,4 and 5.  Will review HEP next session and continue PT per POC.  Pt needs redirection and rest breaks during sessions.   Pt will benefit from skilled therapeutic intervention in order to improve on the following deficits Abnormal gait;Decreased endurance;Decreased safety awareness;Impaired flexibility;Decreased knowledge of use of DME;Pain;Decreased balance;Decreased strength;Decreased mobility;Other (comment)   PT Frequency 2x / week   PT Duration 8 weeks   PT Treatment/Interventions Gait training;Neuromuscular re-education;ADLs/Self Care Home Management;Stair training;Biofeedback;Functional mobility training;Patient/family education;Therapeutic activities;Cryotherapy;Therapeutic exercise;Manual techniques;DME Instruction;Balance training   PT Next Visit Plan Finish checking STG #1 (HEP).  Continue balance and gait.   Consulted and Agree with Plan of Care Patient        Problem List Patient Active Problem List   Diagnosis Date Noted  . Bereavement 12/01/2014  . Psychosis 12/01/2014  . Abnormal blood findings 08/13/2014  . Reactive airway disease 08/11/2014  . History of cancer 08/11/2014  . OSA (obstructive sleep apnea) 08/11/2014  . Healthcare maintenance 07/17/2013  . Chronic kidney disease (CKD), stage III (moderate) 10/04/2012  . Schizophrenia, chronic condition 01/20/2012  . Postmenopausal atrophic vaginitis 11/01/2010  . Chronic diastolic heart failure 71/21/9758  . Allergic rhinitis 10/14/2009  . Obesity 05/14/2009  . Chronic pain syndrome 04/29/2008  . Anemia 09/25/2007  . Hyperlipidemia 09/03/2006  . Gout 09/03/2006  . Essential hypertension 09/03/2006  . GERD  09/03/2006  . Hx-TIA (transient ischemic attack) 05/13/2001    Narda Bonds 01/06/2015, 10:05 AM  [facility] [address] [place], [place], [address] Phone: [phone]   Fax:  [facility] [place] 01/06/2015 10:05 AM Phone: [phone] Fax: [phone]

## 2015-01-08 LAB — STOOL CULTURE

## 2015-01-12 ENCOUNTER — Encounter: Payer: Self-pay | Admitting: Physical Therapy

## 2015-01-13 ENCOUNTER — Encounter: Payer: Self-pay | Admitting: Physical Therapy

## 2015-01-14 ENCOUNTER — Ambulatory Visit (HOSPITAL_COMMUNITY): Payer: Self-pay | Admitting: Psychology

## 2015-01-14 ENCOUNTER — Ambulatory Visit (INDEPENDENT_AMBULATORY_CARE_PROVIDER_SITE_OTHER): Payer: Medicare HMO | Admitting: Psychology

## 2015-01-14 ENCOUNTER — Encounter: Payer: Self-pay | Admitting: Physical Therapy

## 2015-01-14 DIAGNOSIS — F29 Unspecified psychosis not due to a substance or known physiological condition: Secondary | ICD-10-CM

## 2015-01-14 DIAGNOSIS — Z634 Disappearance and death of family member: Secondary | ICD-10-CM

## 2015-01-14 NOTE — Progress Notes (Signed)
   THERAPIST PROGRESS NOTE  Session Time: 2.20pm-3.17pm  Participation Level: Active  Behavioral Response: Well GroomedAlertDepressed  Type of Therapy: Individual Therapy  Treatment Goals addressed: Diagnosis: Bereavement, psychosis and goal 1.  Interventions: Strength-based and Supportive  Summary: LILIYA FULLENWIDER is a 74 y.o. female who presents with report of anger re: tx at another providers office- feeling devalued that when f/u scheduled she wasn't consulted as to what worked best for her or any conversation about- just handed paper to find out change in how has been attending physical therapy.  Pt identified that she can assert herself w/out being harsh at her next visit and was able to move on from this.  Pt reported that she still isn't sleeping well- initially goes to sleep well- wakes to go to the bathroom- falls back asleep then wakes and unable to fall asleep again. Pt discussed that she has learned from friend in her church that the priest is leaving and pt discussed how he is a major support for her.  Pt discussed grieving for her son and how church has been supportive throughout and was important part of her son's life as well.  Pt anticipates this will be a difficult transition as well for her but is able to accept that her faith and God will see her through.    Suicidal/Homicidal: Nowithout intent/plan  Therapist Response: Assessed pt current functioning per pt report.  Processed w/ pt interaction that was upsetting to her and how she can assert herself and wants.  Explored w/pt anticipated stressor with priest transition upcoming and focused pt on her strengths and support system to assist through.   Plan: Return again in 2 months per pt request.  Diagnosis:  Bereavement and Psychotic Disorder NOS        Flora Parks, LPC 01/14/2015

## 2015-01-19 ENCOUNTER — Encounter: Payer: Self-pay | Admitting: Physical Therapy

## 2015-01-20 ENCOUNTER — Encounter: Payer: Self-pay | Admitting: Physical Therapy

## 2015-01-20 ENCOUNTER — Encounter: Payer: Self-pay | Admitting: *Deleted

## 2015-01-22 ENCOUNTER — Other Ambulatory Visit: Payer: Self-pay | Admitting: Internal Medicine

## 2015-01-22 ENCOUNTER — Other Ambulatory Visit (HOSPITAL_COMMUNITY): Payer: Self-pay | Admitting: Psychiatry

## 2015-01-25 NOTE — Telephone Encounter (Signed)
3 month supply tramadol filled one month ago

## 2015-01-25 NOTE — Telephone Encounter (Signed)
PPA stated there was a vmail from 2/29 from [facility] for #75 and no refills, gave them # 75 and 1 refill today

## 2015-01-25 NOTE — Telephone Encounter (Signed)
Tried to contact patient.  Patient should have enough medication until appointment on 02-04-2015.  Denied refill.  LMOM for patient to call back.

## 2015-01-27 ENCOUNTER — Ambulatory Visit: Payer: Self-pay

## 2015-02-02 ENCOUNTER — Ambulatory Visit: Payer: Self-pay | Admitting: Physical Therapy

## 2015-02-03 ENCOUNTER — Ambulatory Visit: Payer: Self-pay

## 2015-02-04 ENCOUNTER — Ambulatory Visit (INDEPENDENT_AMBULATORY_CARE_PROVIDER_SITE_OTHER): Payer: Medicare HMO | Admitting: Psychiatry

## 2015-02-04 ENCOUNTER — Encounter (HOSPITAL_COMMUNITY): Payer: Self-pay | Admitting: Psychiatry

## 2015-02-04 ENCOUNTER — Ambulatory Visit: Payer: Self-pay | Admitting: Physical Therapy

## 2015-02-04 VITALS — BP 140/79 | HR 77 | Ht 63.0 in | Wt 215.8 lb

## 2015-02-04 DIAGNOSIS — F29 Unspecified psychosis not due to a substance or known physiological condition: Secondary | ICD-10-CM

## 2015-02-04 DIAGNOSIS — F200 Paranoid schizophrenia: Secondary | ICD-10-CM

## 2015-02-04 MED ORDER — BUPROPION HCL ER (XL) 300 MG PO TB24: 300.0000 mg | ORAL_TABLET | Freq: Every morning | ORAL | Status: DC

## 2015-02-04 MED ORDER — HALOPERIDOL 2 MG PO TABS: 1.0000 mg | ORAL_TABLET | Freq: Every day | ORAL | Status: DC

## 2015-02-04 NOTE — Progress Notes (Signed)
[facility] Progress Note  [name] [id number] 74 y.o.  02/04/2015 3:01 PM  Chief Complaint: Medication management and follow-up.    History of Present Illness: [name] came for her followup appointment.  She's been complaining of poor sleep, irritability and get frustrated.  Her biggest issue is her daughter who is living with her.  She mentioned her daughter has 75 B again started her husband and she cannot go back to her home.  Patient endorse poor sleep, some time paranoia but denies any hallucination.  She admitted forgetful and sometimes does not remember things.  However she denies any recent fall or dizziness.  She believe Haldol is helping but sometime she needed a higher dose.  She also admitted She drinking alcohol but denies binging.  She minimizes her alcohol intake.  She was seen in the emergency room and also by her primary care physician.  She had blood work which shows increased level of alkaline phosphatase.  Patient lives by herself and currently her daughter is living with her.  She has no tremors or shakes.  She denies any feeling of hopelessness or worthlessness however she gets paranoid sometime in the night.  She is a poor historian and sometimes difficult to express her symptoms.  Her appetite is okay.  Her vitals are stable.  Patient does not like her current therapist and she wants to try a different counselor.  She did not provide much details but she feels that she did not connect with current therapist.  Suicidal Ideation: No Plan Formed: No Patient has means to carry out plan: No  Homicidal Ideation: No Plan Formed: No Patient has means to carry out plan: No  Review of Systems  Respiratory: Negative.   Skin: Negative.   Neurological: Negative for tremors.  Psychiatric/Behavioral: Positive for memory loss and substance abuse. Negative for suicidal ideas. The patient is nervous/anxious and has insomnia.      Psychiatric: Agitation:  Irritability Hallucination: No Depressed Mood: No Insomnia: Yes Hypersomnia: No Altered Concentration: No Feels Worthless: No Grandiose Ideas: No Belief In Special Powers: No New/Increased Substance Abuse: Yes Compulsions: No  Neurologic: Headache: No Seizure: No Paresthesias: No  Past Medical Family, Social History:  Patient was born and raised in [place]. She had 2 sons and one daughter.  Both her sons are in jail. Patient had another son who committed suicide in 2012-05-03 .  Patient husband died many years ago.  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  His brother died in a mental health hospital in [place].  Patient has multiple health problems.  She has GERD, gout, hypertension, degenerative joint disease, osteoarthritis, TIA and bilateral hearing loss.  She has history of mitral valve prolapse, chronic venous insufficiency, diverticulitis hyperlipidemia obesity and seasonal allergies. [facility] her medical needs.    Outpatient Encounter Prescriptions as of 02/04/2015  Medication Sig  . albuterol (PROVENTIL HFA;VENTOLIN HFA) 108 (90 BASE) MCG/ACT inhaler Inhale 2 puffs into the lungs every 6 (six) hours as needed for wheezing or shortness of breath.  . allopurinol (ZYLOPRIM) 300 MG tablet TAKE 1 TABLET BY MOUTH DAILY  . amLODipine (NORVASC) 5 MG tablet TAKE 2 TABLETS BY MOUTH EVERY DAY  . aspirin EC 81 MG tablet Take 81 mg by mouth daily.  [name] atorvastatin (LIPITOR) 40 MG tablet Take 1 tablet (40 mg  total) by mouth daily.  . Biotin (BIOTIN 5000) 5 MG CAPS Take 1 capsule by mouth daily.    [name] buPROPion (WELLBUTRIN XL) 300 MG 24 hr tablet Take 1 tablet (300 mg total) by mouth every morning.  . Calcium Carbonate-Vitamin D (CALCIUM-VITAMIN D) 500-200 MG-UNIT per tablet Take 1 tablet by mouth daily.    [name] docusate sodium (COLACE) 100  MG capsule Take 1 capsule (100 mg total) by mouth every 12 (twelve) hours. (Patient not taking: Reported on 12/08/2014)  . fluconazole (DIFLUCAN) 150 MG tablet Take 1 tablet (150 mg total) by mouth daily.  . fluocinonide cream (LIDEX) 0.05 % APPLY TO AFFECTED AREA TWICE DAILY  . fluticasone (FLONASE) 50 MCG/ACT nasal spray INHALE 2 SPRAYS IN EACH NOSTRIL ONCE DAILY  . furosemide (LASIX) 20 MG tablet TAKE 1 TABLET (20 MG TOTAL) BY MOUTH DAILY. OKAY TO USE ONE MORE TABLET DAILY FOR INCREASED SWELLING AS NEEDED.  . haloperidol (HALDOL) 2 MG tablet Take 0.5 tablets (1 mg total) by mouth at bedtime.  [name] losartan (COZAAR) 100 MG tablet TAKE 1 TABLET BY MOUTH DAILY  . oxybutynin (DITROPAN-XL) 5 MG 24 hr tablet TAKE ONE TABLET BY MOUTH AT BEDTIME  . pantoprazole (PROTONIX) 40 MG tablet Take 1 tablet (40 mg total) by mouth daily.  . polyethylene glycol (MIRALAX / GLYCOLAX) packet MIX 17 GRAMS  (1 CAPFUL) WITH 8 OUNCES OF FLUID AND  TAKE BY MOUTH DAILY AS NEEDED  . potassium chloride (MICRO-K) 10 MEQ CR capsule Take 1 capsule (10 mEq total) by mouth daily.  . traMADol (ULTRAM) 50 MG tablet TAKE 1 TABLET BY MOUTH EVERY 12 HOURS AS NEEDED  . [DISCONTINUED] buPROPion (WELLBUTRIN XL) 300 MG 24 hr tablet Take 1 tablet (300 mg total) by mouth every morning.  . [DISCONTINUED] haloperidol (HALDOL) 1 MG tablet Take 1 tablet (1 mg total) by mouth at bedtime.    Past Psychiatric History/Hospitalization(s): Patient admitted history of one psychiatric hospitalization many years ago. Patient remember she was trying to jump off a window when her husband saved her. She was admitted at [facility] for 3 weeks.  She was seen by this writer in 2013 and given Haldol which helped her but patient stopped taking the medication because she was feeling better.   Anxiety: Yes Bipolar Disorder: No Depression: Yes Mania: No Psychosis: Yes Schizophrenia: Yes Personality Disorder: No Hospitalization for psychiatric illness:  Yes History of Electroconvulsive Shock Therapy: No Prior Suicide Attempts: Patient tried to jump off from the window but her husband rescued her.  Physical Exam: Constitutional:  BP 140/79 mmHg  Pulse 77  Ht 5' 3"  (1.6 m)  Wt 215 lb 12.8 oz (97.886 kg)  BMI 38.24 kg/m2  Recent Results (from the past 2160 hour(s))  Stool culture     Status: None   Collection Time: 01/03/15  1:07 PM  Result Value Ref Range   Specimen Description STOOL    Special Requests NONE    Culture      NO SALMONELLA, SHIGELLA, CAMPYLOBACTER, YERSINIA, OR E.COLI 0157:H7 ISOLATED Performed at [facility]    Report Status 01/08/2015 FINAL   Clostridium Difficile by PCR     Status: None   Collection Time: 01/03/15  1:07 PM  Result Value Ref Range   C difficile by pcr NEGATIVE NEGATIVE  Comprehensive metabolic panel     Status: Abnormal   Collection Time: 01/03/15  1:13 PM  Result Value Ref Range   Sodium 139 135 - 145 mmol/L  Potassium 3.4 (L) 3.5 - 5.1 mmol/L   Chloride 104 96 - 112 mmol/L   CO2 27 19 - 32 mmol/L   Glucose, Bld 92 70 - 99 mg/dL   BUN 18 6 - 23 mg/dL   Creatinine, Ser 1.46 (H) 0.50 - 1.10 mg/dL   Calcium 8.9 8.4 - 10.5 mg/dL   Total Protein 6.5 6.0 - 8.3 g/dL   Albumin 3.7 3.5 - 5.2 g/dL   AST 40 (H) 0 - 37 U/L   ALT 32 0 - 35 U/L   Alkaline Phosphatase 130 (H) 39 - 117 U/L   Total Bilirubin 0.6 0.3 - 1.2 mg/dL   GFR calc non Af Amer 34 (L) >90 mL/min   GFR calc Af Amer 40 (L) >90 mL/min    Comment: (NOTE) The eGFR has been calculated using the CKD EPI equation. This calculation has not been validated in all clinical situations. eGFR's persistently <90 mL/min signify possible Chronic Kidney Disease.    Anion gap 8 5 - 15  CBC with Differential/Platelet     Status: Abnormal   Collection Time: 01/03/15  1:13 PM  Result Value Ref Range   WBC 6.4 4.0 - 10.5 K/uL   RBC 3.75 (L) 3.87 - 5.11 MIL/uL   Hemoglobin 11.6 (L) 12.0 - 15.0 g/dL   HCT 35.4 (L) 36.0 - 46.0 %   MCV  94.4 78.0 - 100.0 fL   MCH 30.9 26.0 - 34.0 pg   MCHC 32.8 30.0 - 36.0 g/dL   RDW 15.7 (H) 11.5 - 15.5 %   Platelets 206 150 - 400 K/uL   Neutrophils Relative % 59 43 - 77 %   Neutro Abs 3.8 1.7 - 7.7 K/uL   Lymphocytes Relative 31 12 - 46 %   Lymphs Abs 2.0 0.7 - 4.0 K/uL   Monocytes Relative 5 3 - 12 %   Monocytes Absolute 0.3 0.1 - 1.0 K/uL   Eosinophils Relative 5 0 - 5 %   Eosinophils Absolute 0.3 0.0 - 0.7 K/uL   Basophils Relative 0 0 - 1 %   Basophils Absolute 0.0 0.0 - 0.1 K/uL  Brain natriuretic peptide     Status: None   Collection Time: 01/03/15  1:13 PM  Result Value Ref Range   B Natriuretic Peptide 15.7 0.0 - 100.0 pg/mL  POC occult blood, ED     Status: None   Collection Time: 01/03/15  2:07 PM  Result Value Ref Range   Fecal Occult Bld NEGATIVE NEGATIVE  Urinalysis, Routine w reflex microscopic     Status: Abnormal   Collection Time: 01/03/15  3:06 PM  Result Value Ref Range   Color, Urine AMBER (A) YELLOW    Comment: BIOCHEMICALS MAY BE AFFECTED BY COLOR   APPearance CLOUDY (A) CLEAR   Specific Gravity, Urine 1.024 1.005 - 1.030   pH 5.5 5.0 - 8.0   Glucose, UA NEGATIVE NEGATIVE mg/dL   Hgb urine dipstick NEGATIVE NEGATIVE   Bilirubin Urine NEGATIVE NEGATIVE   Ketones, ur NEGATIVE NEGATIVE mg/dL   Protein, ur NEGATIVE NEGATIVE mg/dL   Urobilinogen, UA 0.2 0.0 - 1.0 mg/dL   Nitrite NEGATIVE NEGATIVE   Leukocytes, UA SMALL (A) NEGATIVE  Urine microscopic-add on     Status: Abnormal   Collection Time: 01/03/15  3:06 PM  Result Value Ref Range   Squamous Epithelial / LPF MANY (A) RARE   WBC, UA 0-2 <3 WBC/hpf   Bacteria, UA MANY (A) RARE   Urine-Other MUCOUS PRESENT  General Appearance: obese and Casually dressed.  She has difficulty walking.  Musculoskeletal: Strength & Muscle Tone: Patient uses walker for ambulation.  She has chronic pain. Gait & Station: unsteady Patient leans: Front and Backward  Mental status examination Patient is  casually dressed and fairly groomed.  She maintained fair eye contact.  She is a poor historian.  She describes her mood anxious and her affect is labile.  Her speech is rambling and fast.  Her thought process is circumstantial and she has difficulty organizing her thinking.  She endorsed paranoia and hallucination.  She has flight of ideas.  Her attention and concentration is distracted.  She denies any active or passive suicidal thoughts or homicidal thoughts.  Her fund of knowledge is below average.  She is alert and oriented x3.  Her insight judgment and impulse control is okay.  Established Problem, Stable/Improving (1), New problem, with additional work up planned, Review of Psycho-Social Stressors (1), Review or order clinical lab tests (1), Review and summation of old records (2), New Problem, with no additional work-up planned (3), Review of Last Therapy Session (1), Review of Medication Regimen & Side Effects (2) and Review of New Medication or Change in Dosage (2)  Assessment: Axis I:  psychosis NOS, rule out paranoid schizophrenia, rule out major depressive disorder with psychosis, alcohol abuse  Axis II: deferred  Axis III:  see medical history  Plan:    Patient is slowly decompensating.  She is more irritable and does not like her current therapist.  She wants to try a different therapist.  She's been more paranoid and she admitted to start drinking.  I would increase Haldol 2 mg at bedtime to help mood lability paranoia and irritability.  Discuss to stop drinking and talk about interaction with psychotropic medication and alcohol.  He also talk about delayed recovery if she continued to drink.  I review also blood work from her primary care physician and ER visit.  She has mild elevation of alkaline phosphatase.  At this time continue Wellbutrin XL 300 mg daily.  She like to try a different therapist and we will schedule an appointment with Joaquim Lai.  She has no tremors or shakes.  I will  see her again in 3 months. Time spent 25 minutes.  More than 50% of the time spent in psychoeducation, counseling and coordination of care.  Discuss safety plan that anytime having active suicidal thoughts or homicidal thoughts then patient need to call 911 or go to the local emergency room.  Steward Sames T., MD 02/04/2015

## 2015-02-11 ENCOUNTER — Other Ambulatory Visit (HOSPITAL_COMMUNITY): Payer: Self-pay | Admitting: Psychiatry

## 2015-02-11 ENCOUNTER — Telehealth (HOSPITAL_COMMUNITY): Payer: Self-pay | Admitting: *Deleted

## 2015-02-11 ENCOUNTER — Ambulatory Visit (INDEPENDENT_AMBULATORY_CARE_PROVIDER_SITE_OTHER): Payer: Medicare HMO | Admitting: Internal Medicine

## 2015-02-11 VITALS — BP 128/65 | HR 67 | Temp 97.5°F | Wt 218.1 lb

## 2015-02-11 DIAGNOSIS — I10 Essential (primary) hypertension: Secondary | ICD-10-CM | POA: Diagnosis not present

## 2015-02-11 DIAGNOSIS — Z79899 Other long term (current) drug therapy: Secondary | ICD-10-CM

## 2015-02-11 DIAGNOSIS — R053 Chronic cough: Secondary | ICD-10-CM

## 2015-02-11 DIAGNOSIS — K219 Gastro-esophageal reflux disease without esophagitis: Secondary | ICD-10-CM | POA: Diagnosis not present

## 2015-02-11 DIAGNOSIS — Z23 Encounter for immunization: Secondary | ICD-10-CM

## 2015-02-11 DIAGNOSIS — Z0000 Encounter for general adult medical examination without abnormal findings: Secondary | ICD-10-CM

## 2015-02-11 DIAGNOSIS — F200 Paranoid schizophrenia: Secondary | ICD-10-CM

## 2015-02-11 DIAGNOSIS — R05 Cough: Secondary | ICD-10-CM

## 2015-02-11 MED ORDER — HALOPERIDOL 2 MG PO TABS: 2.0000 mg | ORAL_TABLET | Freq: Every day | ORAL | Status: DC

## 2015-02-11 MED ORDER — PANTOPRAZOLE SODIUM 40 MG PO TBEC: 40.0000 mg | DELAYED_RELEASE_TABLET | Freq: Two times a day (BID) | ORAL | Status: DC

## 2015-02-11 NOTE — Assessment & Plan Note (Signed)
PCV-13 today 

## 2015-02-11 NOTE — Progress Notes (Signed)
   Subjective:    Patient ID: [name], female    DOB: 1941-09-24, 74 y.o.   MRN: [id number]  HPI  [name] was here for a continuity visit but ended up being an acute visit for cough / choking.  Review of Systems  Respiratory: Positive for cough and choking.   Cardiovascular: Negative for chest pain.  Gastrointestinal:       - dysphagia  Musculoskeletal:       R ankle feels tight       Objective:   Physical Exam  Constitutional: She is oriented to person, place, and time. She appears well-developed and well-nourished. No distress.  HENT:  Head: Normocephalic and atraumatic.  Right Ear: External ear normal.  Left Ear: External ear normal.  Nose: Nose normal.  Mouth/Throat: Oropharyngeal exudate present.  Eyes: Conjunctivae and EOM are normal. Right eye exhibits no discharge. Left eye exhibits no discharge.  Neck: Normal range of motion. Neck supple.  Cardiovascular: Normal rate, regular rhythm and normal heart sounds.   Pulmonary/Chest: Effort normal and breath sounds normal. No respiratory distress.  Very decreased at B bases  Musculoskeletal: Normal range of motion. She exhibits no edema.  Neurological: She is alert and oriented to person, place, and time.  Skin: Skin is warm and dry. She is not diaphoretic.  Psychiatric: She has a normal mood and affect. Her behavior is normal. Judgment and thought content normal.          Assessment & Plan:

## 2015-02-11 NOTE — Assessment & Plan Note (Addendum)
Her sxs have been present for prob 4-5 months. She has a dry cough both at night and during day. She has alb ordered for PRN but she takes 3-4 puffs sch at bedtime and states that she then lays down and she can't swallow, feels like someone is closing off her throat, she has to sit up at which point she can swallow. But bc of the uncomfortableness, she thinks she hyperventilates. It takes a couple of hrs for the sxs to ease off. These sxs occur after the ALB and before she falls asleep. During the day, she has cough but no other sxs. She has no dysphagia. She denies CP but states R ankle is tight.  CXR 3/6 for cough showed decreased lung vol but no acute changes. Air way seemed patent. TSH Oct 2015 nl. On PPI QAM. Quit tobacco 14 yrs ago.  Exam shows decreased BS in bases B but otherwise clear. Exam otherwise is nl.  ASSESSMENT: possible etiologies could be GERD, RXN to alb (15% incidence of bronchospasm), primary pul d/o (COPD but no hyperinflation, restrictive deficit - possible since low lung vol), OSA (doubt as occurs before sleep), Vol overload (no crackles on exam and CXR one month ago no edema). GERD and alb RXN seem most likely.  PLAN 1. Stop alb. No need to use the med as sch anyway 2. Increase PPI to BID 3. Sch PFT's 4. RTC one month.  If no better and PFT's do not reveal cause, consider CT chest, tx for allergies, pul referral, stopping ARB (chance of cough).

## 2015-02-11 NOTE — Telephone Encounter (Signed)
Patient called and left message on nurse's voice mail that her pharmacy [facility] is refusing to give her, her medication Haldol, telling her to cut 2 mg tablets in half.  I called pharmacy [facility].  I spoke with Vira Agar.  Vira Agar did state they received a RX for Haldol 2 mg to take 0.5 mg at bedtime.  Note below states just take 2 mg at bedtime.  Do you want patient to take 2 mg or half? Please advise.  Thank you  Office note on 02-04-15:  Plan:  Patient is slowly decompensating. She is more irritable and does not like her current therapist. She wants to try a different therapist. She's been more paranoid and she admitted to start drinking. I would increase Haldol 2 mg at bedtime to help mood lability paranoia and irritability. Discuss to stop drinking and talk about interaction with psychotropic medication and alcohol. He also talk about delayed recovery if she continued to drink. I review also blood work from her primary care physician and ER visit. She has mild elevation of alkaline phosphatase. At this time continue Wellbutrin XL 300 mg daily. She like to try a different therapist and we will schedule an appointment with Joaquim Lai. She has no tremors or shakes. I will see her again in 3 months. Time spent 25 minutes. More than 50% of the time spent in psychoeducation, counseling and coordination of care. Discuss safety plan that anytime having active suicidal thoughts or homicidal thoughts then patient need to call 911 or go to the local emergency room.  ARFEEN,SYED T., MD 02/04/2015

## 2015-02-11 NOTE — Patient Instructions (Signed)
1. Stop the inhaler. 2. Take the stomach pill twice a day on empty stomach 3. Get the breathing study 4. Come back in one month

## 2015-02-11 NOTE — Assessment & Plan Note (Signed)
BP Readings from Last 3 Encounters:  02/11/15 128/65  02/04/15 140/79  01/03/15 120/94    Great control on her Norvasc 5, lasix 20 QD, and Cozaar 100.

## 2015-02-16 ENCOUNTER — Ambulatory Visit: Payer: Medicare HMO | Attending: Internal Medicine

## 2015-02-16 VITALS — HR 92

## 2015-02-16 DIAGNOSIS — R269 Unspecified abnormalities of gait and mobility: Secondary | ICD-10-CM | POA: Insufficient documentation

## 2015-02-16 DIAGNOSIS — R531 Weakness: Secondary | ICD-10-CM | POA: Diagnosis not present

## 2015-02-16 DIAGNOSIS — R296 Repeated falls: Secondary | ICD-10-CM

## 2015-02-16 DIAGNOSIS — Z96643 Presence of artificial hip joint, bilateral: Secondary | ICD-10-CM | POA: Insufficient documentation

## 2015-02-16 DIAGNOSIS — R5381 Other malaise: Secondary | ICD-10-CM

## 2015-02-16 NOTE — Therapy (Signed)
[facility] [address] [place], [place], [address] Phone: [phone]   Fax:  [phone]  Physical Therapy Treatment  Patient Details  Name: [name] MRN: [id number] Date of Birth: 11/21/1940 Referring Provider:  Bartholomew Crews, MD  Encounter Date: 02/16/2015      PT End of Session - 02/16/15 2013    Visit Number 8   Number of Visits 17   Date for PT Re-Evaluation 02/06/15   Authorization Type G-code every 10th visit.   PT Start Time 1450   PT Stop Time 1530   PT Time Calculation (min) 40 min   Equipment Utilized During Treatment Gait belt   Activity Tolerance Patient tolerated treatment well   Behavior During Therapy WFL for tasks assessed/performed      Past Medical History  Diagnosis Date  . History of cervical cancer 1972  . Ovarian cancer 1972    S/P oophorectomy  . GERD (gastroesophageal reflux disease)   . Gout   . Hypertension   . DJD (degenerative joint disease), multiple sites     Low back pain worst  . DDD (degenerative disc disease), lumbosacral   . Osteoarthritis of hip     bilateral hips  . Hx-TIA (transient ischemic attack) 05/13/2001    Right facial numbness  . Sensorineural hearing loss of both ears   . Mitral valve prolapse 10/27/1999    Subsequent 2D echo show normal mitral valve  . Chronic venous insufficiency   . Colon, diverticulosis Sept. 2011    outpatient colonoscopy by Dr. Cristina Gong.  Need record  . Hyperlipidemia   . Recurrent boils     History of MRSA skin infections with abscess  . Gout     Uric Acid level 4.2 on 300 allopurinol  . Depression   . Psychosis     Past Surgical History  Procedure Laterality Date  . Joint replacement      bilateral hip replacement  . Abdominal hysterectomy      1972  . Oophorectomy      1972 for ovarian cancer  . Lumbar disc surgery      L5-S1 7/07  . Cervical discectomy  7/07    C5-C6    Filed Vitals:   02/16/15 1501 02/16/15 1516  Pulse: 71 92  SpO2: 97% 97%    Visit Diagnosis:  Abnormality of gait - Plan: PT plan of care cert/re-cert  Frequent falls - Plan: PT plan of care cert/re-cert  Debility - Plan: PT plan of care cert/re-cert      Subjective Assessment - 02/16/15 1459    Subjective Pt denied falls since last visit. Pt reported she cancelled previous sessions due to diarrhea and pulmonary issues. Pt stated she was not admitted to the hospital during this time. Pt has a breathing test scheduled for the Friday 4/22. Pt reported she wanted to try PT today, despite SOB.   Pertinent History Schizophrenia, B THA (approx. 10 years ago), chronic pain    Patient Stated Goals Reduce hip pain so she can get back to the gym for water aerobics, walk down steps safely, stop falling   Currently in Pain? No/denies                         [facility] Adult PT Treatment/Exercise - 02/16/15 1501    Ambulation/Gait   Ambulation/Gait Yes   Ambulation/Gait Assistance 5: Supervision   Ambulation/Gait Assistance Details Pt ambulated over even/uneven terrain with supervision  to ensure safety. VC's to improve stride length and upright posture. Pt required 3 seated rest breaks during and after amb. 2/2 fatigue.   Ambulation Distance (Feet) --  345', 75'x3   Assistive device Rollator   Gait Pattern Step-through pattern;Trunk flexed;Decreased dorsiflexion - right;Decreased dorsiflexion - left   Ambulation Surface Level;Unlevel;Indoor;Outdoor;Paved   Gait velocity 2.24f/sec.  with rollator   Standardized Balance Assessment   Standardized Balance Assessment Berg Balance Test;Timed Up and Go Test   Berg Balance Test   Sit to Stand Able to stand without using hands and stabilize independently   Standing Unsupported Able to stand safely 2 minutes   Sitting with Back Unsupported but Feet Supported on Floor or Stool Able to sit safely and securely 2 minutes   Stand to Sit Sits safely with  minimal use of hands   Transfers Able to transfer safely, minor use of hands   Standing Unsupported with Eyes Closed Able to stand 10 seconds with supervision   Standing Ubsupported with Feet Together Able to place feet together independently and stand for 1 minute with supervision   From Standing, Reach Forward with Outstretched Arm Can reach forward >12 cm safely (5")  6"   From Standing Position, Pick up Object from Floor Able to pick up shoe, needs supervision   From Standing Position, Turn to Look Behind Over each Shoulder Looks behind one side only/other side shows less weight shift   Turn 360 Degrees Able to turn 360 degrees safely but slowly   Standing Unsupported, Alternately Place Feet on Step/Stool Able to stand independently and complete 8 steps >20 seconds   Standing Unsupported, One Foot in Front Able to plae foot ahead of the other independently and hold 30 seconds  tandem stance for 15 seconds   Standing on One Leg Able to lift leg independently and hold equal to or more than 3 seconds   Total Score 45   Timed Up and Go Test   TUG Normal TUG   Normal TUG (seconds) 21.41  with rollator                PT Education - 02/16/15 2012    Education provided Yes   Education Details PT discussed renewal process for PT (4 additional weeks) based on pt's progress. Pt stated she will schedule more visits next week depending on her schedule.   Person(s) Educated Patient   Methods Explanation   Comprehension Verbalized understanding          PT Short Term Goals - 01/06/15 1656    PT SHORT TERM GOAL #1   Title Be independent in HEP to improve strength, endurance, and functional mobility. Target date: 01/05/15.   Status Partially Met   PT SHORT TERM GOAL #2   Title Assess BERG and write appropriate STG and LTG. Target date: 01/05/15.   Status Achieved   PT SHORT TERM GOAL #3   Title Pt will ambulate 300' over even terrain with LRAD at MOD I level to improve functional  moblility. Target date: 01/05/15.   Status Achieved   PT SHORT TERM GOAL #4   Title Pt will report no falls over the last 2 weeks to improve safety during funtional mobility. Target date: 01/05/15.   Status Achieved  Per patient report   PT SHORT TERM GOAL #5   Title Pt will improve BERG balance score to 46/56 to decrease falls risk. Target date: 01/05/15.   Status Achieved  PT Long Term Goals - Mar 14, 2015 January 01, 2015    PT LONG TERM GOAL #1   Title Pt will verbalize undestanding of falls prevention strategies to decrease falls risk. Target date: 02/02/15.   Baseline All unmet LTGs carried over to new POC date: 03/16/15   Status On-going   PT LONG TERM GOAL #2   Title Pt will ambulate 400' over even/uneven terrain with LRAD at MOD I level to improve functional mobility. Target date: 02/02/15.   Status On-going   PT LONG TERM GOAL #3   Title Pt will report she has signed up for water aerobics at fitness center to maintain gains made during PT and to improve quality of life. Target date: 02/02/15.   Status On-going   PT LONG TERM GOAL #4   Title Pt will report decrease in B hip pain during transfers and ambulation to <3/10 to improve quality of life and to perform ADLs with less pain. Target date: 02/02/15.   Status On-going   PT LONG TERM GOAL #5   Title Pt will perform TUG in </=18 seconds to decrease falls risk. Target date: 02/02/15.   Status On-going   PT LONG TERM GOAL #6   Title Pt will improve gait speed to >/=2.78f/sec. to decease falls risk. Target date: 02/02/15.   Status Achieved   PT LONG TERM GOAL #7   Title Pt will improve BERG score to >/=50/56 to decrease falls risk. Target date: 02/02/15.   Status On-going               Plan - 015-May-20162Mar 05, 2013   Clinical Impression Statement PT performed re-evaluation today, as pt missed 5 weeks of PT due to stomach virus and medical appointments. Pt's BERG score and TUG time indicate pt is still at a risk for falls and would benefit from  skilled PT to improve safety during functional mobility. Pt met LTG 6 (amb. goal). PT sent recert to MD for 4 additional weeks of PT.    Pt will benefit from skilled therapeutic intervention in order to improve on the following deficits Abnormal gait;Decreased endurance;Decreased safety awareness;Impaired flexibility;Decreased knowledge of use of DME;Pain;Decreased balance;Decreased strength;Decreased mobility;Other (comment)   Rehab Potential Good   PT Frequency 2x / week   PT Duration Other (comment)  original POC 8 weeks, PT requesting additional 2x/week for 4 weeks   PT Treatment/Interventions Gait training;Neuromuscular re-education;ADLs/Self Care Home Management;Stair training;Biofeedback;Functional mobility training;Patient/family education;Therapeutic activities;Cryotherapy;Therapeutic exercise;Manual techniques;DME Instruction;Balance training   PT Next Visit Plan Falls prevention handout and review HEP.   Consulted and Agree with Plan of Care Patient          G-Codes - 005/15/1622017-03-05   Functional Assessment Tool Used Gait speed with rollator: 2.160fsec.; TUG with rollator: 21.41 sec.; BERG: 45/56   Functional Limitation Mobility: Walking and moving around   Mobility: Walking and Moving Around Current Status ([id number] least 40 percent but less than 60 percent impaired, limited or restricted   Mobility: Walking and Moving Around Goal Status ([id number] least 20 percent but less than 40 percent impaired, limited or restricted      Problem List Patient Active Problem List   Diagnosis Date Noted  . Bereavement 12/01/2014  . Psychosis 12/01/2014  . Abnormal blood findings 08/13/2014  . Reactive airway disease 08/11/2014  . History of cancer 08/11/2014  . OSA (obstructive sleep apnea) 08/11/2014  . Healthcare maintenance 07/17/2013  . Chronic kidney disease (CKD), stage III (moderate) 10/04/2012  . Schizophrenia, chronic condition  01/20/2012  . Postmenopausal atrophic vaginitis  11/01/2010  . Chronic diastolic heart failure 75/91/6384  . Allergic rhinitis 10/14/2009  . Obesity 05/14/2009  . Chronic pain syndrome 04/29/2008  . Anemia 09/25/2007  . Hyperlipidemia 09/03/2006  . Gout 09/03/2006  . Essential hypertension 09/03/2006  . GERD 09/03/2006  . Hx-TIA (transient ischemic attack) 05/13/2001    Ayden Apodaca L 02/16/2015, 8:22 PM  [facility] [address] [place], [place], [address] Phone: [phone]   Fax:  [phone]     Geoffry Paradise, PT,DPT 02/16/2015 8:22 PM Phone: [phone] Fax: [phone]

## 2015-02-17 ENCOUNTER — Ambulatory Visit: Payer: Medicare HMO

## 2015-02-17 DIAGNOSIS — R296 Repeated falls: Secondary | ICD-10-CM

## 2015-02-17 DIAGNOSIS — R269 Unspecified abnormalities of gait and mobility: Secondary | ICD-10-CM | POA: Diagnosis not present

## 2015-02-17 NOTE — Patient Instructions (Addendum)
Fall Prevention and Home Safety Falls cause injuries and can affect all age groups. It is possible to use preventive measures to significantly decrease the likelihood of falls. There are many simple measures which can make your home safer and prevent falls. OUTDOORS  Repair cracks and edges of walkways and driveways.  Remove high doorway thresholds.  Trim shrubbery on the main path into your home.  Have good outside lighting.  Clear walkways of tools, rocks, debris, and clutter.  Check that handrails are not broken and are securely fastened. Both sides of steps should have handrails.  Have leaves, snow, and ice cleared regularly.  Use sand or salt on walkways during winter months.  In the garage, clean up grease or oil spills. BATHROOM  Install night lights.  Install grab bars by the toilet and in the tub and shower.  Use non-skid mats or decals in the tub or shower.  Place a plastic non-slip stool in the shower to sit on, if needed.  Keep floors dry and clean up all water on the floor immediately.  Remove soap buildup in the tub or shower on a regular basis.  Secure bath mats with non-slip, double-sided rug tape.  Remove throw rugs and tripping hazards from the floors. BEDROOMS  Install night lights.  Make sure a bedside light is easy to reach.  Do not use oversized bedding.  Keep a telephone by your bedside.  Have a firm chair with side arms to use for getting dressed.  Remove throw rugs and tripping hazards from the floor. KITCHEN  Keep handles on pots and pans turned toward the center of the stove. Use back burners when possible.  Clean up spills quickly and allow time for drying.  Avoid walking on wet floors.  Avoid hot utensils and knives.  Position shelves so they are not too high or low.  Place commonly used objects within easy reach.  If necessary, use a sturdy step stool with a grab bar when reaching.  Keep electrical cables out of the  way.  Do not use floor polish or wax that makes floors slippery. If you must use wax, use non-skid floor wax.  Remove throw rugs and tripping hazards from the floor. STAIRWAYS  Never leave objects on stairs.  Place handrails on both sides of stairways and use them. Fix any loose handrails. Make sure handrails on both sides of the stairways are as long as the stairs.  Check carpeting to make sure it is firmly attached along stairs. Make repairs to worn or loose carpet promptly.  Avoid placing throw rugs at the top or bottom of stairways, or properly secure the rug with carpet tape to prevent slippage. Get rid of throw rugs, if possible.  Have an electrician put in a light switch at the top and bottom of the stairs. OTHER FALL PREVENTION TIPS  Wear low-heel or rubber-soled shoes that are supportive and fit well. Wear closed toe shoes.  When using a stepladder, make sure it is fully opened and both spreaders are firmly locked. Do not climb a closed stepladder.  Add color or contrast paint or tape to grab bars and handrails in your home. Place contrasting color strips on first and last steps.  Learn and use mobility aids as needed. Install an electrical emergency response system.  Turn on lights to avoid dark areas. Replace light bulbs that burn out immediately. Get light switches that glow.  Arrange furniture to create clear pathways. Keep furniture in the same place.    Firmly attach carpet with non-skid or double-sided tape.  Eliminate uneven floor surfaces.  Select a carpet pattern that does not visually hide the edge of steps.  Be aware of all pets. OTHER HOME SAFETY TIPS  Set the water temperature for 120 F (48.8 C).  Keep emergency numbers on or near the telephone.  Keep smoke detectors on every level of the home and near sleeping areas. Document Released: 10/06/2002 Document Revised: 04/16/2012 Document Reviewed: 01/05/2012 [facility] Patient Information 2015  [place], [place]. This information is not intended to replace advice given to you by your health care provider. Make sure you discuss any questions you have with your health care provider.    **Perform all balance exercises in corner with chair in front of you for safety:   Feet Together (Compliant Surface) Head Motion - Eyes Open   With eyes open, standing on compliant surface: ___pillow_____, feet together, move head slowly: up and down and side to side for 30 seconds. Repeat __3__ times per session. Do __1__ sessions per day.  Copyright  VHI. All rights reserved.  Feet Together (Compliant Surface) Varied Arm Positions - Eyes Closed   Stand on compliant surface: ___pillow_____ with feet together and arms at your side. Close eyes and visualize upright position. Hold__10-30__ seconds. Repeat _3___ times per session. Do __1__ sessions per day.  Copyright  VHI. All rights reserved.  Feet Partial Heel-Toe, Varied Arm Positions - Eyes Open   With eyes open, right foot partially in front of the other, arms out, look straight ahead at a stationary object. Hold _30___ seconds. Repeat with left foot partially in front. Repeat __3__ times per session. Do __1__ sessions per day.  Copyright  VHI. All rights reserved.  Single Leg - Eyes Open   Holding support, lift right leg while maintaining balance over other leg. Progress to removing hands from support surface for longer periods of time. Hold_10-30___ seconds. Repeat _3___ times per session. Do __1__ sessions per day.  Copyright  VHI. All rights reserved.

## 2015-02-17 NOTE — Therapy (Signed)
[facility] [address] [place], [place], [address] Phone: [phone]   Fax:  [phone]  Physical Therapy Treatment  Patient Details  Name: [name] MRN: [id number] Date of Birth: [birth year] Referring Provider:  Bartholomew Crews, MD  Encounter Date: 02/17/2015      PT End of Session - 02/17/15 1640    Visit Number 9   Number of Visits 17   Date for PT Re-Evaluation --  new POC through 03/17/15 as pt missed one month of PT.   Authorization Type G-code every 10th visit.   PT Start Time 1454   PT Stop Time 1532   PT Time Calculation (min) 38 min   Equipment Utilized During Treatment Gait belt   Activity Tolerance Patient tolerated treatment well   Behavior During Therapy WFL for tasks assessed/performed      Past Medical History  Diagnosis Date  . History of cervical cancer 1972  . Ovarian cancer 1972    S/P oophorectomy  . GERD (gastroesophageal reflux disease)   . Gout   . Hypertension   . DJD (degenerative joint disease), multiple sites     Low back pain worst  . DDD (degenerative disc disease), lumbosacral   . Osteoarthritis of hip     bilateral hips  . Hx-TIA (transient ischemic attack) 05/13/2001    Right facial numbness  . Sensorineural hearing loss of both ears   . Mitral valve prolapse 10/27/1999    Subsequent 2D echo show normal mitral valve  . Chronic venous insufficiency   . Colon, diverticulosis Sept. 2011    outpatient colonoscopy by Dr. Cristina Gong.  Need record  . Hyperlipidemia   . Recurrent boils     History of MRSA skin infections with abscess  . Gout     Uric Acid level 4.2 on 300 allopurinol  . Depression   . Psychosis     Past Surgical History  Procedure Laterality Date  . Joint replacement      bilateral hip replacement  . Abdominal hysterectomy      1972  . Oophorectomy      1972 for ovarian cancer  . Lumbar disc surgery      L5-S1 7/07  . Cervical  discectomy  7/07    C5-C6    There were no vitals filed for this visit.  Visit Diagnosis:  Frequent falls      Subjective Assessment - 02/17/15 1458    Subjective Pt reported she fell yesterday, when reaching down to pick something off the floor. Pt reported the fell broke her hearing aide. Pt denied any pain after fall and denied LOC.   Pertinent History Schizophrenia, B THA (approx. 10 years ago), chronic pain    Patient Stated Goals Reduce hip pain so she can get back to the gym for water aerobics, walk down steps safely, stop falling   Currently in Pain? No/denies      Static Standing Balance    Static Standing - Balance Support Right upper extremity supported;No upper extremity supported   Static Standing - Level of Assistance 5: Stand by assistance;Other (comment)  min guard   Static Standing - Comment/# of Minutes Balance activities performed in corner with chair in front of pt for safety, B LEs, 2-3 sets with 10-30second holds on compliant and non-compliant surface: Reviewed HEP: feet apart/together with and without head turns, feet apart/together with eyes open/closed, tandem stance, modified tandem stance, single leg stance (with UE support). VC's for technique.  Pt experienced 3 LOB episodes which required UE support to maintain balance.                                  PT Education - 02/17/15 1640    Education provided Yes   Education Details Reviewed and revised balance HEP. Fall prevention handout provided and reviewed with pt.   Person(s) Educated Patient   Methods Explanation;Demonstration;Verbal cues;Handout   Comprehension Verbalized understanding;Returned demonstration          PT Short Term Goals - 01/06/15 1656    PT SHORT TERM GOAL #1   Title Be independent in HEP to improve strength, endurance, and functional mobility. Target date: 01/05/15.   Status Partially Met   PT SHORT TERM GOAL #2   Title Assess BERG and write  appropriate STG and LTG. Target date: 01/05/15.   Status Achieved   PT SHORT TERM GOAL #3   Title Pt will ambulate 300' over even terrain with LRAD at MOD I level to improve functional moblility. Target date: 01/05/15.   Status Achieved   PT SHORT TERM GOAL #4   Title Pt will report no falls over the last 2 weeks to improve safety during funtional mobility. Target date: 01/05/15.   Status Achieved  Per patient report   PT SHORT TERM GOAL #5   Title Pt will improve BERG balance score to 46/56 to decrease falls risk. Target date: 01/05/15.   Status Achieved           PT Long Term Goals - 02/16/15 2016    PT LONG TERM GOAL #1   Title Pt will verbalize undestanding of falls prevention strategies to decrease falls risk. Target date: 02/02/15.   Baseline All unmet LTGs carried over to new POC date: 03/16/15   Status On-going   PT LONG TERM GOAL #2   Title Pt will ambulate 400' over even/uneven terrain with LRAD at MOD I level to improve functional mobility. Target date: 02/02/15.   Status On-going   PT LONG TERM GOAL #3   Title Pt will report she has signed up for water aerobics at fitness center to maintain gains made during PT and to improve quality of life. Target date: 02/02/15.   Status On-going   PT LONG TERM GOAL #4   Title Pt will report decrease in B hip pain during transfers and ambulation to <3/10 to improve quality of life and to perform ADLs with less pain. Target date: 02/02/15.   Status On-going   PT LONG TERM GOAL #5   Title Pt will perform TUG in </=18 seconds to decrease falls risk. Target date: 02/02/15.   Status On-going   PT LONG TERM GOAL #6   Title Pt will improve gait speed to >/=2.69f/sec. to decease falls risk. Target date: 02/02/15.   Status Achieved   PT LONG TERM GOAL #7   Title Pt will improve BERG score to >/=50/56 to decrease falls risk. Target date: 02/02/15.   Status On-going               Plan - 02/17/15 1641    Clinical Impression Statement Pt demonstrated  progress as she was able to progress static balance activities from non-compliant to compliant surfaces. Pt did experience a fall yesterday at home, while holding wall/rollator and squatting to pick something off floor. PT reiterated the importance of having someone assist pt with hard to reach items. Continue with  POC.   Pt will benefit from skilled therapeutic intervention in order to improve on the following deficits Abnormal gait;Decreased endurance;Decreased safety awareness;Impaired flexibility;Decreased knowledge of use of DME;Pain;Decreased balance;Decreased strength;Decreased mobility;Other (comment)   Rehab Potential Good   PT Frequency 2x / week   PT Duration Other (comment)  12 weeks   PT Treatment/Interventions Gait training;Neuromuscular re-education;ADLs/Self Care Home Management;Stair training;Biofeedback;Functional mobility training;Patient/family education;Therapeutic activities;Cryotherapy;Therapeutic exercise;Manual techniques;DME Instruction;Balance training   PT Next Visit Plan G-CODE. Review flexibility and strengthening HEP and add PT visits.   Consulted and Agree with Plan of Care Patient      Problem List Patient Active Problem List   Diagnosis Date Noted  . Bereavement 12/01/2014  . Psychosis 12/01/2014  . Abnormal blood findings 08/13/2014  . Reactive airway disease 08/11/2014  . History of cancer 08/11/2014  . OSA (obstructive sleep apnea) 08/11/2014  . Healthcare maintenance 07/17/2013  . Chronic kidney disease (CKD), stage III (moderate) 10/04/2012  . Schizophrenia, chronic condition 01/20/2012  . Postmenopausal atrophic vaginitis 11/01/2010  . Chronic diastolic heart failure 75/88/3254  . Allergic rhinitis 10/14/2009  . Obesity 05/14/2009  . Chronic pain syndrome 04/29/2008  . Anemia 09/25/2007  . Hyperlipidemia 09/03/2006  . Gout 09/03/2006  . Essential hypertension 09/03/2006  . GERD 09/03/2006  . Hx-TIA (transient ischemic attack) 05/13/2001     Sir Mallis L 02/17/2015, 4:44 PM  [facility] [address] [place], [place], [address] Phone: [phone]   Fax:  [phone]     Geoffry Paradise, PT,DPT 02/17/2015 4:44 PM Phone: [phone] Fax: [phone]

## 2015-02-18 ENCOUNTER — Ambulatory Visit: Payer: Self-pay | Admitting: Internal Medicine

## 2015-02-19 ENCOUNTER — Ambulatory Visit (HOSPITAL_COMMUNITY)
Admission: RE | Admit: 2015-02-19 | Discharge: 2015-02-19 | Disposition: A | Discharge status: Home / Self Care | Payer: Medicare HMO | Source: Ambulatory Visit | Attending: Internal Medicine | Admitting: Internal Medicine

## 2015-02-19 DIAGNOSIS — R053 Chronic cough: Secondary | ICD-10-CM

## 2015-02-19 DIAGNOSIS — R05 Cough: Secondary | ICD-10-CM | POA: Diagnosis not present

## 2015-02-19 LAB — PULMONARY FUNCTION TEST
DL/VA % pred: 94 %
DL/VA: 4.42 ml/min/mmHg/L
DLCO unc % pred: 57 %
DLCO unc: 13.17 ml/min/mmHg
FEF 25-75 Post: 1.59 L/s
FEF 25-75 Pre: 1.57 L/s
FEF2575-%Change-Post: 0 %
FEF2575-%Pred-Post: 106 %
FEF2575-%Pred-Pre: 105 %
FEV1-%Change-Post: 3 %
FEV1-%Pred-Post: 101 %
FEV1-%Pred-Pre: 98 %
FEV1-Post: 1.68 L
FEV1-Pre: 1.62 L
FEV1FVC-%Change-Post: 1 %
FEV1FVC-%Pred-Pre: 101 %
FEV6-%Change-Post: 2 %
FEV6-%Pred-Post: 98 %
FEV6-%Pred-Pre: 95 %
FEV6-Post: 2.02 L
FEV6-Pre: 1.97 L
FEV6FVC-%Change-Post: -4 %
FEV6FVC-%Pred-Post: 99 %
FEV6FVC-%Pred-Pre: 104 %
FVC-%Change-Post: 1 %
FVC-%Pred-Post: 98 %
FVC-%Pred-Pre: 96 %
FVC-Post: 2.11 L
FVC-Pre: 2.07 L
Post FEV1/FVC ratio: 80 %
Post FEV6/FVC ratio: 96 %
Pre FEV1/FVC ratio: 78 %
Pre FEV6/FVC Ratio: 100 %
RV % pred: 114 %
RV: 2.53 L
TLC % pred: 92 %
TLC: 4.53 L

## 2015-02-19 MED ORDER — ALBUTEROL SULFATE (2.5 MG/3ML) 0.083% IN NEBU
2.5000 mg | INHALATION_SOLUTION | Freq: Once | RESPIRATORY_TRACT | Status: AC
  Administered 2015-02-19: 2.5 mg via RESPIRATORY_TRACT

## 2015-02-23 ENCOUNTER — Ambulatory Visit: Payer: Medicare HMO

## 2015-02-23 VITALS — HR 88

## 2015-02-23 DIAGNOSIS — R269 Unspecified abnormalities of gait and mobility: Secondary | ICD-10-CM

## 2015-02-23 DIAGNOSIS — R5381 Other malaise: Secondary | ICD-10-CM

## 2015-02-23 DIAGNOSIS — R296 Repeated falls: Secondary | ICD-10-CM

## 2015-02-23 NOTE — Therapy (Signed)
[facility] [address] [place], [place], [address] Phone: [phone]   Fax:  [phone]  Physical Therapy Treatment  Patient Details  Name: [name] MRN: [id number] Date of Birth: 09-14-1941 Referring Provider:  Bartholomew Crews, MD  Encounter Date: 02/23/2015      PT End of Session - 02/23/15 1537    Visit Number 10   Number of Visits 17   Date for PT Re-Evaluation --  12 weeks   Authorization Type G-code every 10th visit.   PT Start Time 1448   PT Stop Time 6270  pt had to leave early, as ride was here   PT Time Calculation (min) 39 min   Equipment Utilized During Treatment Gait belt   Activity Tolerance Patient tolerated treatment well   Behavior During Therapy [facility] for tasks assessed/performed      Past Medical History  Diagnosis Date  . History of cervical cancer 1972  . Ovarian cancer 1972    S/P oophorectomy  . GERD (gastroesophageal reflux disease)   . Gout   . Hypertension   . DJD (degenerative joint disease), multiple sites     Low back pain worst  . DDD (degenerative disc disease), lumbosacral   . Osteoarthritis of hip     bilateral hips  . Hx-TIA (transient ischemic attack) 05/13/2001    Right facial numbness  . Sensorineural hearing loss of both ears   . Mitral valve prolapse 10/27/1999    Subsequent 2D echo show normal mitral valve  . Chronic venous insufficiency   . Colon, diverticulosis Sept. 2011    outpatient colonoscopy by Dr. Cristina Gong.  Need record  . Hyperlipidemia   . Recurrent boils     History of MRSA skin infections with abscess  . Gout     Uric Acid level 4.2 on 300 allopurinol  . Depression   . Psychosis     Past Surgical History  Procedure Laterality Date  . Joint replacement      bilateral hip replacement  . Abdominal hysterectomy      1972  . Oophorectomy      1972 for ovarian cancer  . Lumbar disc surgery      L5-S1 7/07  . Cervical  discectomy  7/07    C5-C6    Filed Vitals:   02/23/15 1512  Pulse: 88  SpO2: 99%    Visit Diagnosis:  Abnormality of gait  Frequent falls  Debility      Subjective Assessment - 02/23/15 1500    Subjective Pt denied falls or changes since last visit. Pt reported she does not have pulmonary test results yet. Pt stated she does not want to continue PT after this week, due to financial concerns.   Pertinent History Schizophrenia, B THA (approx. 10 years ago), chronic pain    Patient Stated Goals Reduce hip pain so she can get back to the gym for water aerobics, walk down steps safely, stop falling   Currently in Pain? No/denies                         [facility] Adult PT Treatment/Exercise - 02/23/15 1452    Ambulation/Gait   Ambulation/Gait Yes   Ambulation/Gait Assistance 5: Supervision   Ambulation/Gait Assistance Details Pt ambulated over even/uneven terrain without overt LOB. VC's to stay within rollator while traversing ramps, and to decrease trunk flexion.   Ambulation Distance (Feet) 420 Feet   Assistive device Rollator  Gait Pattern Step-through pattern;Trunk flexed;Decreased dorsiflexion - right;Decreased dorsiflexion - left   Ambulation Surface Level;Indoor   Gait velocity 2.78f/sec.  with rollator   Standardized Balance Assessment   Standardized Balance Assessment Timed Up and Go Test;Berg Balance Test   Berg Balance Test   Sit to Stand Able to stand without using hands and stabilize independently   Standing Unsupported Able to stand safely 2 minutes   Sitting with Back Unsupported but Feet Supported on Floor or Stool Able to sit safely and securely 2 minutes   Stand to Sit Sits safely with minimal use of hands   Transfers Able to transfer safely, minor use of hands   Standing Unsupported with Eyes Closed Able to stand 10 seconds safely   Standing Ubsupported with Feet Together Able to place feet together independently and stand 1 minute safely    From Standing, Reach Forward with Outstretched Arm Can reach forward >12 cm safely (5")   From Standing Position, Pick up Object from Floor Able to pick up shoe, needs supervision   From Standing Position, Turn to Look Behind Over each Shoulder Looks behind one side only/other side shows less weight shift   Turn 360 Degrees Able to turn 360 degrees safely in 4 seconds or less   Standing Unsupported, Alternately Place Feet on Step/Stool Able to stand independently and complete 8 steps >20 seconds   Standing Unsupported, One Foot in Front Able to place foot tandem independently and hold 30 seconds   Standing on One Leg Able to lift leg independently and hold 5-10 seconds   Total Score 51   Timed Up and Go Test   TUG Normal TUG   Normal TUG (seconds) --  15.79sec. with rollator, 16.56sec. without AD                PT Education - 02/23/15 1536    Education provided Yes   Education Details PT discussed the importance of continuing physical therapy, as pt reported she did not wish to continue (despite missing one month of PT). PT also discussed the importance of continuing to perform HEP after PT.   Person(s) Educated Patient   Methods Explanation   Comprehension Verbalized understanding          PT Short Term Goals - 01/06/15 1656    PT SHORT TERM GOAL #1   Title Be independent in HEP to improve strength, endurance, and functional mobility. Target date: 01/05/15.   Status Partially Met   PT SHORT TERM GOAL #2   Title Assess BERG and write appropriate STG and LTG. Target date: 01/05/15.   Status Achieved   PT SHORT TERM GOAL #3   Title Pt will ambulate 300' over even terrain with LRAD at MOD I level to improve functional moblility. Target date: 01/05/15.   Status Achieved   PT SHORT TERM GOAL #4   Title Pt will report no falls over the last 2 weeks to improve safety during funtional mobility. Target date: 01/05/15.   Status Achieved  Per patient report   PT SHORT TERM GOAL #5    Title Pt will improve BERG balance score to 46/56 to decrease falls risk. Target date: 01/05/15.   Status Achieved           PT Long Term Goals - 02/23/15 1539    PT LONG TERM GOAL #1   Title Pt will verbalize undestanding of falls prevention strategies to decrease falls risk. Target date: 02/02/15.   Baseline All unmet LTGs carried  over to new POC date: 03/16/15   Status Achieved   PT LONG TERM GOAL #2   Title Pt will ambulate 400' over even/uneven terrain with LRAD at MOD I level to improve functional mobility. Target date: 02/02/15.   Status Partially Met   PT LONG TERM GOAL #3   Title Pt will report she has signed up for water aerobics at fitness center to maintain gains made during PT and to improve quality of life. Target date: 02/02/15.   Status On-going   PT LONG TERM GOAL #4   Title Pt will report decrease in B hip pain during transfers and ambulation to <3/10 to improve quality of life and to perform ADLs with less pain. Target date: 02/02/15.   Status Not Met   PT LONG TERM GOAL #5   Title Pt will perform TUG in </=18 seconds to decrease falls risk. Target date: 02/02/15.   Status Achieved   PT LONG TERM GOAL #6   Title Pt will improve gait speed to >/=2.31f/sec. to decease falls risk. Target date: 02/02/15.   Status Achieved   PT LONG TERM GOAL #7   Title Pt will improve BERG score to >/=50/56 to decrease falls risk. Target date: 02/02/15.   Status Partially Met               Plan - 02016/05/161538    Clinical Impression Statement Pt demonstrated progress as she met LTGs 1, 5 , and 6. Pt partially met LTGs 3 and 7. PT will finish LTGs assessment and d/c pt next visit per pt request. Pt limited by SOB, as she required seated and standing rest breaks during session but SaO2 on room air remained >95%.  Continue with POC.   Pt will benefit from skilled therapeutic intervention in order to improve on the following deficits Abnormal gait;Decreased endurance;Decreased safety  awareness;Impaired flexibility;Decreased knowledge of use of DME;Pain;Decreased balance;Decreased strength;Decreased mobility;Other (comment)   Rehab Potential Good   PT Frequency 2x / week   PT Duration --  12 weeks   PT Treatment/Interventions Gait training;Neuromuscular re-education;ADLs/Self Care Home Management;Stair training;Biofeedback;Functional mobility training;Patient/family education;Therapeutic activities;Cryotherapy;Therapeutic exercise;Manual techniques;DME Instruction;Balance training   PT Next Visit Plan Review flexibility and strengthening HEP and finish checking LTGs and d/c.   Consulted and Agree with Plan of Care Patient          G-Codes - 005/16/20161540    Functional Assessment Tool Used Gait speed with rollator: 2.346fsec.; TUG with rollator: 15.79 sec.; BERG: 50/56   Functional Limitation Mobility: Walking and moving around   Mobility: Walking and Moving Around Current Status ([phone] least 20 percent but less than 40 percent impaired, limited or restricted   Mobility: Walking and Moving Around Goal Status ([phone] least 20 percent but less than 40 percent impaired, limited or restricted      Problem List Patient Active Problem List   Diagnosis Date Noted  . Bereavement 12/01/2014  . Psychosis 12/01/2014  . Abnormal blood findings 08/13/2014  . Reactive airway disease 08/11/2014  . History of cancer 08/11/2014  . OSA (obstructive sleep apnea) 08/11/2014  . Healthcare maintenance 07/17/2013  . Chronic kidney disease (CKD), stage III (moderate) 10/04/2012  . Schizophrenia, chronic condition 01/20/2012  . Postmenopausal atrophic vaginitis 11/01/2010  . Chronic diastolic heart failure 0782/95/6213. Allergic rhinitis 10/14/2009  . Obesity 05/14/2009  . Chronic pain syndrome 04/29/2008  . Anemia 09/25/2007  . Hyperlipidemia 09/03/2006  . Gout 09/03/2006  . Essential hypertension 09/03/2006  .  GERD 09/03/2006  . Hx-TIA (transient ischemic attack)  05/13/2001    Andry Bogden L 02/23/2015, 3:42 PM  [facility] [address] [place], [place], [address] Phone: [phone]   Fax:  [phone]     Geoffry Paradise, PT,DPT 02/23/2015 3:42 PM Phone: [phone] Fax: [phone]

## 2015-02-24 ENCOUNTER — Ambulatory Visit: Payer: Medicare HMO

## 2015-02-24 DIAGNOSIS — R296 Repeated falls: Secondary | ICD-10-CM

## 2015-02-24 DIAGNOSIS — R5381 Other malaise: Secondary | ICD-10-CM

## 2015-02-24 DIAGNOSIS — R269 Unspecified abnormalities of gait and mobility: Secondary | ICD-10-CM

## 2015-02-24 NOTE — Therapy (Signed)
[facility] [address] [place], [place], [address] Phone: [phone]   Fax:  [phone]  Physical Therapy Treatment  Patient Details  Name: [name] MRN: [id number] Date of Birth: Apr 11, 1941 Referring Provider:  Bartholomew Crews, MD  Encounter Date: 02/24/2015      PT End of Session - 02/24/15 1609    Visit Number 11   Number of Visits 17   Date for PT Re-Evaluation --  12 weeks   Authorization Type G-code every 10th visit.   PT Start Time 1535   PT Stop Time 1601   PT Time Calculation (min) 26 min   Activity Tolerance Patient tolerated treatment well   Behavior During Therapy W[facility] for tasks assessed/performed      Past Medical History  Diagnosis Date  . History of cervical cancer 1972  . Ovarian cancer 1972    S/P oophorectomy  . GERD (gastroesophageal reflux disease)   . Gout   . Hypertension   . DJD (degenerative joint disease), multiple sites     Low back pain worst  . DDD (degenerative disc disease), lumbosacral   . Osteoarthritis of hip     bilateral hips  . Hx-TIA (transient ischemic attack) 05/13/2001    Right facial numbness  . Sensorineural hearing loss of both ears   . Mitral valve prolapse 10/27/1999    Subsequent 2D echo show normal mitral valve  . Chronic venous insufficiency   . Colon, diverticulosis Sept. 2011    outpatient colonoscopy by Dr. Cristina Gong.  Need record  . Hyperlipidemia   . Recurrent boils     History of MRSA skin infections with abscess  . Gout     Uric Acid level 4.2 on 300 allopurinol  . Depression   . Psychosis     Past Surgical History  Procedure Laterality Date  . Joint replacement      bilateral hip replacement  . Abdominal hysterectomy      1972  . Oophorectomy      1972 for ovarian cancer  . Lumbar disc surgery      L5-S1 7/07  . Cervical discectomy  7/07    C5-C6    There were no vitals filed for this visit.  Visit Diagnosis:   Frequent falls  Abnormality of gait  Debility      Subjective Assessment - 02/24/15 1608    Subjective Pt denied falls or changes since last visit. Pt reported she still wants to d/c today from PT.         Therex: Pt performed therex standing at counter with 1-2 UE support and min guard to supervision: VC's, tactile cues, and demonstration for technique. Pt required one seated rest breaks due to fatigue after therex PT provided pt with yellow theraband and demonstrated how to progress HEP. -B hip ext. 1x10. -B hip abd. x10. -B knee flexion 1x10. -B hip marches along counter 2x7'. -Seated hamstring stretch with B LEs, 3x30second holds.                            PT Education - 02/24/15 1609    Education provided Yes   Education Details Reviewed strengthening and flexibility HEP.   Person(s) Educated Patient   Methods Explanation;Demonstration;Verbal cues;Handout   Comprehension Verbalized understanding;Returned demonstration          PT Short Term Goals - 01/06/15 1656    PT SHORT TERM GOAL #1  Title Be independent in HEP to improve strength, endurance, and functional mobility. Target date: 01/05/15.   Status Partially Met   PT SHORT TERM GOAL #2   Title Assess BERG and write appropriate STG and LTG. Target date: 01/05/15.   Status Achieved   PT SHORT TERM GOAL #3   Title Pt will ambulate 300' over even terrain with LRAD at MOD I level to improve functional moblility. Target date: 01/05/15.   Status Achieved   PT SHORT TERM GOAL #4   Title Pt will report no falls over the last 2 weeks to improve safety during funtional mobility. Target date: 01/05/15.   Status Achieved  Per patient report   PT SHORT TERM GOAL #5   Title Pt will improve BERG balance score to 46/56 to decrease falls risk. Target date: 01/05/15.   Status Achieved           PT Long Term Goals - 03-08-2015 1610    PT LONG TERM GOAL #1   Title Pt will verbalize undestanding of falls  prevention strategies to decrease falls risk. Target date: 02/02/15.   Baseline All unmet LTGs carried over to new POC date: 03/16/15   Status Achieved   PT LONG TERM GOAL #2   Title Pt will ambulate 400' over even/uneven terrain with LRAD at MOD I level to improve functional mobility. Target date: 02/02/15.   Status Partially Met   PT LONG TERM GOAL #3   Title Pt will report she has signed up for water aerobics at fitness center to maintain gains made during PT and to improve quality of life. Target date: 02/02/15.   Status Partially Met   PT LONG TERM GOAL #4   Title Pt will report decrease in B hip pain during transfers and ambulation to <3/10 to improve quality of life and to perform ADLs with less pain. Target date: 02/02/15.   Status Not Met   PT LONG TERM GOAL #5   Title Pt will perform TUG in </=18 seconds to decrease falls risk. Target date: 02/02/15.   Status Achieved   PT LONG TERM GOAL #6   Title Pt will improve gait speed to >/=2.82f/sec. to decease falls risk. Target date: 02/02/15.   Status Achieved   PT LONG TERM GOAL #7   Title Pt will improve BERG score to >/=50/56 to decrease falls risk. Target date: 02/02/15.   Status Partially Met               Plan - 0May 09, 20161609    Clinical Impression Statement Pt discharging from PT today, per pt request due to financial concerns. Please see d/c summary for details.          G-Codes - [id number]    Functional Assessment Tool Used Gait speed with rollator: 2.330fsec.; TUG with rollator: 15.79 sec.; BERG: 50/56   Functional Limitation Mobility: Walking and moving around   Mobility: Walking and Moving Around Goal Status ([phone] least 20 percent but less than 40 percent impaired, limited or restricted   Mobility: Walking and Moving Around Discharge Status ([phone] least 20 percent but less than 40 percent impaired, limited or restricted      Problem List Patient Active Problem List   Diagnosis Date Noted  . Bereavement  12/01/2014  . Psychosis 12/01/2014  . Abnormal blood findings 08/13/2014  . Reactive airway disease 08/11/2014  . History of cancer 08/11/2014  . OSA (obstructive sleep apnea) 08/11/2014  . Healthcare maintenance 07/17/2013  . Chronic  kidney disease (CKD), stage III (moderate) 10/04/2012  . Schizophrenia, chronic condition 01/20/2012  . Postmenopausal atrophic vaginitis 11/01/2010  . Chronic diastolic heart failure 55/20/8022  . Allergic rhinitis 10/14/2009  . Obesity 05/14/2009  . Chronic pain syndrome 04/29/2008  . Anemia 09/25/2007  . Hyperlipidemia 09/03/2006  . Gout 09/03/2006  . Essential hypertension 09/03/2006  . GERD 09/03/2006  . Hx-TIA (transient ischemic attack) 05/13/2001    PHYSICAL THERAPY DISCHARGE SUMMARY  Visits from Start of Care: 11  Current functional level related to goals / functional outcomes:     PT Long Term Goals - 02/24/15 1610    PT LONG TERM GOAL #1   Title Pt will verbalize undestanding of falls prevention strategies to decrease falls risk. Target date: 02/02/15.   Baseline All unmet LTGs carried over to new POC date: 03/16/15   Status Achieved   PT LONG TERM GOAL #2   Title Pt will ambulate 400' over even/uneven terrain with LRAD at MOD I level to improve functional mobility. Target date: 02/02/15.   Status Partially Met   PT LONG TERM GOAL #3   Title Pt will report she has signed up for water aerobics at fitness center to maintain gains made during PT and to improve quality of life. Target date: 02/02/15.   Status Partially Met   PT LONG TERM GOAL #4   Title Pt will report decrease in B hip pain during transfers and ambulation to <3/10 to improve quality of life and to perform ADLs with less pain. Target date: 02/02/15.   Status Not Met   PT LONG TERM GOAL #5   Title Pt will perform TUG in </=18 seconds to decrease falls risk. Target date: 02/02/15.   Status Achieved   PT LONG TERM GOAL #6   Title Pt will improve gait speed to >/=2.83f/sec. to  decease falls risk. Target date: 02/02/15.   Status Achieved   PT LONG TERM GOAL #7   Title Pt will improve BERG score to >/=50/56 to decrease falls risk. Target date: 02/02/15.   Status Partially Met        Remaining deficits: Hip pain, decreased endurance, and impaired balance and strength. D/c per pt request due to financial concerns.   Education / Equipment: HEP  Plan: Patient agrees to discharge.  Patient goals were partially met. Patient is being discharged due to the patient's request.  ?????       Kathie Posa L 02/24/2015, 4:11 PM  [place] [address]Phone: [phone]  Fax:  [phone]    JGeoffry Paradise PT,DPT 02/24/2015 4:12 PM Phone: [phone]: [phone]

## 2015-03-01 ENCOUNTER — Ambulatory Visit (HOSPITAL_COMMUNITY): Payer: Self-pay | Admitting: Clinical

## 2015-03-04 ENCOUNTER — Ambulatory Visit (INDEPENDENT_AMBULATORY_CARE_PROVIDER_SITE_OTHER): Payer: Medicare HMO | Admitting: Clinical

## 2015-03-04 ENCOUNTER — Encounter (HOSPITAL_COMMUNITY): Payer: Self-pay | Admitting: Clinical

## 2015-03-04 DIAGNOSIS — F200 Paranoid schizophrenia: Secondary | ICD-10-CM

## 2015-03-04 DIAGNOSIS — F4321 Adjustment disorder with depressed mood: Secondary | ICD-10-CM

## 2015-03-04 NOTE — Progress Notes (Signed)
Patient:   [name]   DOB:   [birth year]  MR Number:  [id number]  Location:  [facility] [id number] [place] [address] Dept: [phone]           Date of Service:   03/04/2015  Start Time:   1:30 End Time:   2:28  Provider/Observer:  Jerel Shepherd Counselor       Billing Code/Service: [phone]  Behavioral Observation: [name]  presents as a 74 y.o.-year-old Native American ([place])  / African American Female who appeared her stated age. her dress was Appropriate and she was Casual and her manners were Appropriate to the situation.  There were any physical disabilities noted.  she displayed an appropriate level of cooperation and motivation.    Interactions:    Active   Attention:   abnormal - easily distractible   Memory:   abnormal - is unable to recall own history  Speech (Volume):  normal  Speech:   normal pitch and normal volume  Thought Process:  Disorganized  Though Content:  WNL  Orientation:   person and place  Judgment:   Fair  Planning:   Fair  Affect:    Appropriate  Mood:    Depressed  Insight:   Fair  Intelligence:   normal  Chief Complaint:    No chief complaint on file.   Reason for Service:  Referred by Dr Adele Schilder  Current Symptoms:  " I am not sure. I have a lot of thoughts going on."  Source of Distress:             " My son  killed him self,  My daughter is worrying me to death"  Marital Status/Living: "My Husband died at 12 due to seizures. He was drinking alcohol on the weekends. He was drinking too much and not taking his medication for his siezures.He would go out with hs brother in law who drove a beer truck. Sometimes they would just bring him home and leave him to recover.  I was so worried he would die at home.  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 I was an only child. When I was young my Dad was off at war. My mother had gotten pregnant and married to him and stayed with his family.  When he came home they weren't happy so Mother left and we came to [place]. People said he wasn't the same after the war. He had left a young man and you know it was too much."  "I did all right until 15. My Grandaddy died. Grandma didn't know how to take care of things. I had  stayed with my grandparents growing up.We were all really broken by his death. Every Suinday we went for [employer]." "I got married at in 64 I had 3 kids of his before we got married. I had it in my head I didn't want to  be married then I was pregnant again. Then we got married.  I just had it in my mind that I didn't want a husband.      "Citters (hallucinations) started  when I moved back to family house in 59 , they appeared 2008. It never scared me but I just couldn't understand why I was seeing them."   Natural/Informal Support:                           Daughter, Father Barnabas Lister,    Substance Use:  No concerns of substance abuse are reported.     Medical History:   Past Medical History  Diagnosis Date  . History of cervical cancer 1972  . Ovarian cancer 1972    S/P oophorectomy  . GERD (gastroesophageal reflux disease)   . Gout   . Hypertension   . DJD (degenerative joint disease), multiple sites     Low back pain worst  . DDD (degenerative disc disease), lumbosacral   . Osteoarthritis of hip     bilateral hips  . Hx-TIA (transient ischemic attack) 05/13/2001    Right facial numbness  . Sensorineural hearing loss of both ears    . Mitral valve prolapse 10/27/1999    Subsequent 2D echo show normal mitral valve  . Chronic venous insufficiency   . Colon, diverticulosis Sept. 2011    outpatient colonoscopy by Dr. Cristina Gong.  Need record  . Hyperlipidemia   . Recurrent boils     History of MRSA skin infections with abscess  . Gout     Uric Acid level 4.2 on 300 allopurinol  . Depression   . Psychosis           Medication List       This list is accurate as of: 03/04/15  1:40 PM.  Always use your most recent med list.               albuterol 108 (90 BASE) MCG/ACT inhaler  Commonly known as:  PROVENTIL HFA;VENTOLIN HFA  Inhale 2 puffs into the lungs every 6 (six) hours as needed for wheezing or shortness of breath.     allopurinol 300 MG tablet  Commonly known as:  ZYLOPRIM  TAKE 1 TABLET BY MOUTH DAILY     amLODipine 5 MG tablet  Commonly known as:  NORVASC  TAKE 2 TABLETS BY MOUTH EVERY DAY     aspirin EC 81 MG tablet  Take 81 mg by mouth daily.     atorvastatin 40 MG tablet  Commonly known as:  LIPITOR  Take 1 tablet (40 mg total) by mouth daily.     BIOTIN 5000 5 MG Caps  Generic drug:  Biotin  Take 1 capsule by mouth daily.     buPROPion 300 MG 24 hr tablet  Commonly known as:  WELLBUTRIN XL  Take 1 tablet (300 mg total) by mouth every morning.     calcium-vitamin D 500-200 MG-UNIT per tablet  Take 1 tablet by mouth daily.     docusate sodium 100 MG capsule  Commonly known as:  COLACE  Take 1 capsule (100 mg total) by mouth every 12 (twelve) hours.     fluconazole 150 MG tablet  Commonly known as:  DIFLUCAN  Take 1 tablet (150 mg total) by mouth daily.     fluocinonide cream 0.05 %  Commonly known as:  LIDEX  APPLY TO AFFECTED AREA TWICE DAILY     fluticasone  50 MCG/ACT nasal spray  Commonly known as:  FLONASE  INHALE 2 SPRAYS IN EACH NOSTRIL ONCE DAILY     furosemide 20 MG tablet  Commonly known as:  LASIX  TAKE 1 TABLET (20 MG TOTAL) BY MOUTH DAILY. OKAY TO USE ONE MORE  TABLET DAILY FOR INCREASED SWELLING AS NEEDED.     haloperidol 2 MG tablet  Commonly known as:  HALDOL  Take 1 tablet (2 mg total) by mouth at bedtime.     losartan 100 MG tablet  Commonly known as:  COZAAR  TAKE 1 TABLET BY MOUTH DAILY     oxybutynin 5 MG 24 hr tablet  Commonly known as:  DITROPAN-XL  TAKE ONE TABLET BY MOUTH AT BEDTIME     pantoprazole 40 MG tablet  Commonly known as:  PROTONIX  Take 1 tablet (40 mg total) by mouth 2 (two) times daily before a meal.     polyethylene glycol packet  Commonly known as:  MIRALAX / GLYCOLAX  MIX 17 GRAMS  (1 CAPFUL) WITH 8 OUNCES OF FLUID AND  TAKE BY MOUTH DAILY AS NEEDED     potassium chloride 10 MEQ CR capsule  Commonly known as:  MICRO-K  Take 1 capsule (10 mEq total) by mouth daily.     traMADol 50 MG tablet  Commonly known as:  ULTRAM  TAKE 1 TABLET BY MOUTH EVERY 12 HOURS AS NEEDED              Sexual History:   History  Sexual Activity  . Sexual Activity: Not on file     Abuse/Trauma History: "I don't think so."    Psychiatric History:  Inpatient - 1 time  A long time ago- I developed cancer and with kids, and crazy husband ( died 29 years ago), and I ended up having a total hysterectmy, I started crying and  Wanting to jump out the window - I was suicidal - too many changes of life. Body change and all that  - perimion pills and reduced until I was better.    Strengths:   " My prayers are my strength."   Recovery Goals:  "to feel good."  Hobbies/Interests:               "prayer."   Challenges/Barriers: "I'm not sure."    Family Med/Psych History:  Family History  Problem Relation Age of Onset  . Diabetes Mother   . Heart disease Mother   . Hearing loss Mother   . Stroke Mother   . Schizophrenia Maternal Grandmother   . Diabetes Maternal Grandfather   . Other Brother     brother died in mental health hospital    Risk of Suicide/Violence: low Denies any current suicidal or homicidal  ideation  History of Suicide/Violence:  She reports one prior attempt 38 years ago. She denies any violence towards others.  Psychosis:   " I would see things like kitty cats,  Duck, squirrels  I would see them under the couch.  Dr Adele Schilder gave my Haldol -  Got me so I could sleep and didn't see critters any more."      "Citters (hallucinations) started  when I moved back to family house in 37 , they appeared 2008. 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 Ms. Cullen reports that she had been hospitalized about 38 years ago for suicidal ideation. She reports that she was experiencing too many life changes.  She reports that she had experienced some "blues" off and on, but in 2008 she began experiencing hallucination about 8 years ago ( Ms Merendino is not sure of dates - it could be longer) She reported at that time she began to be worried others were meaning her harm. She also began seeing  "Citters (hallucinations) started  when I moved back to family house in 14 , they appeared 2008. It never scared me but I just couldn't understand why I was seeing them." " I would see things like kitty cats,  Duck, squirrels  I would see them under the couch.  Dr Adele Schilder gave my Haldol -  Got me so I could sleep and didn't see critters any more." She reported that she gets confused a lot and sometimes agitated.  Ms. Schild reported "My child died , Harrell Gave 2 years ago. He was catholic, he was a really good man, always helped others. Father Ronalee Belts was his best friend.  He was transferred to [place] to teach and it was really hard on [place]. Father Barnabas Lister replaced him and is a good friend to me.  I think he is going to be transferred. He would bring me   Communion. It is going to upset me when he goes. I have to wait for him to have the courage to tell me I know he doesn't want to upset me.  Recommendation/Plan: Ms. Duley stated that she would like to meet once a month. Clinician agreed and let her know if she would like to meet more often then she could set up an earlier appointment if one is available. Follow safety plan as needed.

## 2015-03-25 ENCOUNTER — Ambulatory Visit (HOSPITAL_COMMUNITY): Payer: Self-pay | Admitting: Psychology

## 2015-03-26 ENCOUNTER — Telehealth (HOSPITAL_COMMUNITY): Payer: Self-pay | Admitting: *Deleted

## 2015-03-26 NOTE — Telephone Encounter (Signed)
Patients pharmacy sent a fax.  The fax stated that per patient the Haloperidol 2 mg is causing her to sleep to much, request change to 1 mg.   Patient called back and left message on nurses vm.  Patient stated that the Haloperidol 2 mg is too much, making her sleepy.  Patient stated that the pharmacist at [facility] will have Haloperidol 1 mg in stock on Wednesday, June 1.  Patient needs Haloperidol 1 mg sent on Tuesday or Wednesday.

## 2015-03-26 NOTE — Telephone Encounter (Signed)
Dr. Adele Schilder,   Patients pharmacy sent a fax.  The fax stated that per patient the Haloperidol 2 mg is causing her to sleep to much, request change to 1 mg.  I tried to contact patient had to [facility] for call back.

## 2015-03-30 ENCOUNTER — Other Ambulatory Visit: Payer: Self-pay | Admitting: Internal Medicine

## 2015-03-30 ENCOUNTER — Other Ambulatory Visit: Payer: Self-pay | Admitting: *Deleted

## 2015-03-30 NOTE — Telephone Encounter (Signed)
I called her at her home but no one picked up the phone.

## 2015-03-31 ENCOUNTER — Other Ambulatory Visit: Payer: Self-pay | Admitting: *Deleted

## 2015-03-31 NOTE — Telephone Encounter (Signed)
This is a high potency steroid and should not be on it long term.  I have never Rx'd and she is due for an appt anyway. She may sch CC with me and address then or sch appt [facility] res to only address cream use.

## 2015-03-31 NOTE — Telephone Encounter (Signed)
K low most recent lab. I have requested F/U appt. My directions last appt was QD

## 2015-03-31 NOTE — Telephone Encounter (Signed)
Pls phone in tramadol

## 2015-03-31 NOTE — Telephone Encounter (Signed)
Called to pharm 

## 2015-04-06 ENCOUNTER — Other Ambulatory Visit (HOSPITAL_COMMUNITY): Payer: Self-pay | Admitting: Psychiatry

## 2015-04-06 DIAGNOSIS — F200 Paranoid schizophrenia: Secondary | ICD-10-CM

## 2015-04-06 MED ORDER — HALOPERIDOL 1 MG PO TABS
1.0000 mg | ORAL_TABLET | Freq: Every day | ORAL | Status: DC
Start: 2015-04-06 — End: 2015-04-26

## 2015-04-08 ENCOUNTER — Ambulatory Visit (INDEPENDENT_AMBULATORY_CARE_PROVIDER_SITE_OTHER): Payer: Medicare HMO | Admitting: Clinical

## 2015-04-08 DIAGNOSIS — F200 Paranoid schizophrenia: Secondary | ICD-10-CM

## 2015-04-08 DIAGNOSIS — F4321 Adjustment disorder with depressed mood: Secondary | ICD-10-CM | POA: Diagnosis not present

## 2015-04-09 ENCOUNTER — Other Ambulatory Visit: Payer: Self-pay

## 2015-04-09 DIAGNOSIS — Z1231 Encounter for screening mammogram for malignant neoplasm of breast: Secondary | ICD-10-CM

## 2015-04-13 ENCOUNTER — Encounter (HOSPITAL_COMMUNITY): Payer: Self-pay | Admitting: Clinical

## 2015-04-13 NOTE — Progress Notes (Signed)
   THERAPIST PROGRESS NOTE  Session Time: 1:35 -2:30  Participation Level: Active  Behavioral Response: CasualAlertDepressed  Type of Therapy: Individual Therapy  Treatment Goals addressed: improve psychiatric symptoms  Interventions: Motivational Interviewing  Summary: LATIKA KRONICK is a 74 y.o. female who presents with Paranoid Schizophrenia and unresolved grief  Suicidal/Homicidal: Nowithout intent/plan  Therapist Response: Eran met with clinician for an individual session. Dusty discussed her current life events and her psychiatric symptoms. Shanoah shared that her symptoms remain the same but she is "doing all right." Chalon shared that she confronted her priest about leaving and he admitted it was true. She told him that it was okay but that she would miss him. She shared that he plans to introduce her to the new priest. She shared that she is concerned that she will not get the same type of support but understands that he needs to do what is right for him. Imya discussed her daughter. She shared that her daughter has been staying with her but she does not want her to. Daralyn shared her plans to have her daughter leave. Client and clinician discussed healthy coping skills. Nicolena shared about her son and missing him. Shaneece reported that she found the session helpful.   Plan: Return again in 1 month.  Diagnosis: Axis I: Paranoid Schizophrenia and unresolved gried     Nishka Heide A, LCSW 04/13/2015

## 2015-04-26 ENCOUNTER — Other Ambulatory Visit (HOSPITAL_COMMUNITY): Payer: Self-pay | Admitting: Psychiatry

## 2015-04-28 ENCOUNTER — Other Ambulatory Visit (HOSPITAL_COMMUNITY): Payer: Self-pay | Admitting: Psychiatry

## 2015-05-04 ENCOUNTER — Encounter: Payer: Self-pay | Admitting: *Deleted

## 2015-05-06 ENCOUNTER — Ambulatory Visit: Payer: Self-pay | Admitting: Internal Medicine

## 2015-05-06 ENCOUNTER — Ambulatory Visit (HOSPITAL_COMMUNITY): Payer: Self-pay | Admitting: Psychiatry

## 2015-05-13 ENCOUNTER — Ambulatory Visit: Payer: Self-pay | Admitting: Internal Medicine

## 2015-05-20 ENCOUNTER — Ambulatory Visit
Admission: RE | Admit: 2015-05-20 | Discharge: 2015-05-20 | Disposition: A | Discharge status: Home / Self Care | Payer: Medicare HMO | Source: Ambulatory Visit

## 2015-05-20 ENCOUNTER — Telehealth: Payer: Self-pay | Admitting: Internal Medicine

## 2015-05-20 DIAGNOSIS — Z1231 Encounter for screening mammogram for malignant neoplasm of breast: Secondary | ICD-10-CM

## 2015-05-20 NOTE — Telephone Encounter (Signed)
Patient calling states that she was seen today for a mammogram and they told her that she doesn't not need to have any more and wanted to let her dr know so that she doesn't get sent over for them anymore.

## 2015-05-20 NOTE — Telephone Encounter (Signed)
Talked with pt - had mammogram done today. Pt states she prefers not to do any more mammogram. Pt states she is 73 and has no transportation. Hilda Blades Abrahm Mancia RN 05/20/15 4:50PM

## 2015-05-26 ENCOUNTER — Other Ambulatory Visit (HOSPITAL_COMMUNITY): Payer: Self-pay | Admitting: Psychiatry

## 2015-05-26 DIAGNOSIS — F200 Paranoid schizophrenia: Secondary | ICD-10-CM

## 2015-05-31 NOTE — Telephone Encounter (Signed)
One time refill of patient's prescribed Haldol authorized by Dr. Adele Schilder as patient was rescheduled from 05/06/15 to 06/24/15 due to provider being out of the office that date.

## 2015-06-01 ENCOUNTER — Other Ambulatory Visit (HOSPITAL_COMMUNITY): Payer: Self-pay | Admitting: Psychiatry

## 2015-06-02 ENCOUNTER — Other Ambulatory Visit (HOSPITAL_COMMUNITY): Payer: Self-pay | Admitting: Psychiatry

## 2015-06-02 MED ORDER — BUPROPION HCL ER (XL) 300 MG PO TB24: 300.0000 mg | ORAL_TABLET | Freq: Every morning | ORAL | Status: DC

## 2015-06-24 ENCOUNTER — Ambulatory Visit (INDEPENDENT_AMBULATORY_CARE_PROVIDER_SITE_OTHER): Payer: Medicare HMO | Admitting: Psychiatry

## 2015-06-24 ENCOUNTER — Encounter (HOSPITAL_COMMUNITY): Payer: Self-pay | Admitting: Psychiatry

## 2015-06-24 VITALS — BP 115/61 | HR 75 | Ht 63.0 in | Wt 213.0 lb

## 2015-06-24 DIAGNOSIS — F200 Paranoid schizophrenia: Secondary | ICD-10-CM | POA: Diagnosis not present

## 2015-06-24 MED ORDER — BUPROPION HCL ER (XL) 300 MG PO TB24: 300.0000 mg | ORAL_TABLET | Freq: Every morning | ORAL | Status: DC

## 2015-06-24 MED ORDER — HALOPERIDOL 1 MG PO TABS
ORAL_TABLET | ORAL | Status: DC
Start: 2015-06-24 — End: 2015-09-30

## 2015-06-24 NOTE — Progress Notes (Signed)
[facility] Progress Note  [name] [id number] 74 y.o.  06/24/2015 3:05 PM  Chief Complaint: Medication management and follow-up.    History of Present Illness: [name] came for her followup appointment.  She is taking Haldol 1 mg at night.  She is sleeping better.  She tried to milligrams but it makes her very sleepy and groggy.  She is happy that her family situation is better.  Her daughter moved out and she is living on her own.  Patient is happy that her daughter is getting divorce from her husband which is very good for her daughter.  Patient denies any irritability, anger, paranoia or any hallucination.  She.  Haldol is helping her sleep and paranoia.  She denies any recent agitation or any anger.  She has no tremors or shakes.  Patient denies drinking or using any illegal substances.  She is taking Wellbutrin in the morning and Haldol at bedtime.  Her appetite is okay.  Her vitals are stable.  She is seeing Tharon Aquas and her next appointment is in October.  Suicidal Ideation: No Plan Formed: No Patient has means to carry out plan: No  Homicidal Ideation: No Plan Formed: No Patient has means to carry out plan: No  Review of Systems  Respiratory: Negative.   Skin: Negative.   Neurological: Negative for tremors.  Psychiatric/Behavioral: Negative for suicidal ideas.    Psychiatric: Agitation: No Hallucination: No Depressed Mood: No Insomnia: No Hypersomnia: No Altered Concentration: No Feels Worthless: No Grandiose Ideas: No Belief In Special Powers: No New/Increased Substance Abuse: No Compulsions: No  Neurologic: Headache: No Seizure: No Paresthesias: No  Past Medical Family, Social History:  Patient was born and raised in [place]. She had 2 sons and one daughter.  Both her sons are in jail. Patient had another son who committed suicide in 05/21/12 .  Patient husband died many years ago.  Patient has 3 years of college and  she has worked as a Doctor, hospital .  Patient endorsed grandfather and brother has significant psychiatric illness.  His brother died in a mental health hospital in [place].  Patient has multiple health problems.  She has GERD, gout, hypertension, degenerative joint disease, osteoarthritis, TIA and bilateral hearing loss.  She has history of mitral valve prolapse, chronic venous insufficiency, diverticulitis hyperlipidemia obesity and seasonal allergies. [facility] needs.    Outpatient Encounter Prescriptions as of 06/24/2015  Medication Sig  . albuterol (PROVENTIL HFA;VENTOLIN HFA) 108 (90 BASE) MCG/ACT inhaler Inhale 2 puffs into the lungs every 6 (six) hours as needed for wheezing or shortness of breath.  . allopurinol (ZYLOPRIM) 300 MG tablet TAKE 1 TABLET BY MOUTH DAILY  . amLODipine (NORVASC) 5 MG tablet TAKE 2 TABLETS BY MOUTH EVERY DAY  . aspirin EC 81 MG tablet Take 81 mg by mouth daily.  [name] atorvastatin (LIPITOR) 40 MG tablet Take 1 tablet (40 mg total) by mouth daily.  . Biotin (BIOTIN 5000) 5 MG CAPS Take 1 capsule by mouth daily.    [name] buPROPion (WELLBUTRIN XL) 300 MG 24 hr tablet Take 1 tablet (300 mg total) by mouth every morning.  . Calcium Carbonate-Vitamin D (CALCIUM-VITAMIN D) 500-200 MG-UNIT per tablet Take 1 tablet by mouth daily.    [name] docusate sodium (COLACE) 100 MG capsule Take 1 capsule (100 mg total) by mouth every 12 (twelve) hours. (Patient not taking: Reported on 12/08/2014)  . fluconazole (DIFLUCAN) 150 MG tablet  Take 1 tablet (150 mg total) by mouth daily.  . fluocinonide cream (LIDEX) 0.05 % APPLY TO AFFECTED AREA TWICE DAILY  . fluticasone (FLONASE) 50 MCG/ACT nasal spray INHALE 2 SPRAYS IN EACH NOSTRIL ONCE DAILY  . furosemide (LASIX) 20 MG tablet Take 1 tablet (20 mg total) by mouth daily.  . haloperidol (HALDOL) 1 MG tablet TAKE ONE TABLET BY MOUTH DAILY AT BEDTIME.  [name] losartan (COZAAR) 100 MG tablet TAKE ONE TABLET BY MOUTH  DAILY  . oxybutynin (DITROPAN-XL) 5 MG 24 hr tablet TAKE ONE TABLET BY MOUTH AT BEDTIME  . pantoprazole (PROTONIX) 40 MG tablet Take 1 tablet (40 mg total) by mouth 2 (two) times daily before a meal.  . polyethylene glycol (MIRALAX / GLYCOLAX) packet MIX 17 GRAMS  (1 CAPFUL) WITH 8 OUNCES OF FLUID AND  TAKE BY MOUTH DAILY AS NEEDED  . potassium chloride (MICRO-K) 10 MEQ CR capsule Take 1 capsule (10 mEq total) by mouth daily.  . traMADol (ULTRAM) 50 MG tablet TAKE 1 TABLET BY MOUTH EVERY 12 HOURS AS NEEDED  . [DISCONTINUED] buPROPion (WELLBUTRIN XL) 300 MG 24 hr tablet Take 1 tablet (300 mg total) by mouth every morning.  . [DISCONTINUED] haloperidol (HALDOL) 1 MG tablet TAKE ONE TABLET BY MOUTH DAILY AT BEDTIME.   No facility-administered encounter medications on file as of 06/24/2015.    Past Psychiatric History/Hospitalization(s): Patient admitted history of one psychiatric hospitalization many years ago. Patient remember she was trying to jump off a window when her husband saved her. She was admitted at [facility] for [facility].  She was seen by this writer in 2013 and given Haldol which helped her but patient stopped taking the medication because she was feeling better.   Anxiety: Yes Bipolar Disorder: No Depression: Yes Mania: No Psychosis: Yes Schizophrenia: Yes Personality Disorder: No Hospitalization for psychiatric illness: Yes History of Electroconvulsive Shock Therapy: No Prior Suicide Attempts: Patient tried to jump off from the window but her husband rescued her.  Physical Exam: Constitutional:  BP 115/61 mmHg  Pulse 75  Ht 5\' 3"  (1.6 m)  Wt 213 lb (96.616 kg)  BMI 37.74 kg/m2  No results found for this or any previous visit (from the past 2160 hour(s)).  General Appearance: obese and Casually dressed.  She has difficulty walking.  Musculoskeletal: Strength & Muscle Tone: Patient uses walker for ambulation.  She has chronic pain. Gait & Station: unsteady Patient  leans: Front and Backward  Mental status examination Patient is casually dressed and fairly groomed.  She maintained fair eye contact.  She is superficially cooperative .  She describes her mood good and her affect is labile.  Her speech is fast.  Her thought process is circumstantial.  She denies any auditory or visual hallucination.  She denies any paranoia or any delusions.  She is still has some flight of ideas .  Her attention and concentration is distracted.  She denies any active or passive suicidal thoughts or homicidal thoughts.  Her fund of knowledge is below average.  She is alert and oriented x3.  Her insight judgment and impulse control is okay.  Established Problem, Stable/Improving (1), Review of Psycho-Social Stressors (1), Review of Last Therapy Session (1) and Review of Medication Regimen & Side Effects (2)  Assessment: Axis I:  psychosis NOS, rule out paranoid schizophrenia, rule out major depressive disorder with psychosis, alcohol abuse  Axis II: deferred  Axis III:  see medical history  Plan:   Patient is taking her  medication and fairly stable on low-dose Haldol and Wellbutrin.  She does not want to increase the dose because she felt very sedated and sleepy.  She stopped drinking.  She is feeling much better since daughter moved out.  Continue current medication.  Discussed medication side effects and benefits.  Encouraged to keep appointment with Joaquim Lai.  Recommended to call us back if she has any question or any concern.  Follow-up in 3 months.  Razia Screws T., MD 06/24/2015

## 2015-06-30 ENCOUNTER — Other Ambulatory Visit (HOSPITAL_COMMUNITY): Payer: Self-pay | Admitting: Psychiatry

## 2015-07-01 ENCOUNTER — Other Ambulatory Visit: Payer: Self-pay | Admitting: Internal Medicine

## 2015-07-07 ENCOUNTER — Telehealth: Payer: Self-pay | Admitting: Internal Medicine

## 2015-07-07 ENCOUNTER — Encounter: Payer: Self-pay | Admitting: Internal Medicine

## 2015-07-07 ENCOUNTER — Other Ambulatory Visit: Payer: Self-pay | Admitting: Internal Medicine

## 2015-07-07 NOTE — Telephone Encounter (Signed)
Called pharmacy and confirmed that there are refills available, it was sent to her 9/2, they will call her and go over her meds that were sent that day

## 2015-07-07 NOTE — Telephone Encounter (Signed)
Rx called in to pharmacy. 

## 2015-07-07 NOTE — Telephone Encounter (Signed)
Pt called requesting lasix to be filled.

## 2015-07-08 ENCOUNTER — Encounter: Payer: Self-pay | Admitting: Internal Medicine

## 2015-07-08 ENCOUNTER — Ambulatory Visit (INDEPENDENT_AMBULATORY_CARE_PROVIDER_SITE_OTHER): Payer: Medicare HMO | Admitting: Internal Medicine

## 2015-07-08 ENCOUNTER — Other Ambulatory Visit (HOSPITAL_COMMUNITY)
Admission: RE | Admit: 2015-07-08 | Discharge: 2015-07-08 | Disposition: A | Discharge status: Home / Self Care | Payer: Medicare HMO | Source: Ambulatory Visit | Attending: Internal Medicine | Admitting: Internal Medicine

## 2015-07-08 VITALS — BP 112/54 | HR 84 | Temp 98.4°F | Wt 215.6 lb

## 2015-07-08 DIAGNOSIS — F209 Schizophrenia, unspecified: Secondary | ICD-10-CM

## 2015-07-08 DIAGNOSIS — N898 Other specified noninflammatory disorders of vagina: Secondary | ICD-10-CM

## 2015-07-08 DIAGNOSIS — M109 Gout, unspecified: Secondary | ICD-10-CM

## 2015-07-08 DIAGNOSIS — J4520 Mild intermittent asthma, uncomplicated: Secondary | ICD-10-CM

## 2015-07-08 DIAGNOSIS — Z23 Encounter for immunization: Secondary | ICD-10-CM | POA: Diagnosis not present

## 2015-07-08 DIAGNOSIS — I10 Essential (primary) hypertension: Secondary | ICD-10-CM | POA: Diagnosis not present

## 2015-07-08 DIAGNOSIS — I5032 Chronic diastolic (congestive) heart failure: Secondary | ICD-10-CM

## 2015-07-08 DIAGNOSIS — G8929 Other chronic pain: Secondary | ICD-10-CM | POA: Diagnosis not present

## 2015-07-08 DIAGNOSIS — R21 Rash and other nonspecific skin eruption: Secondary | ICD-10-CM

## 2015-07-08 DIAGNOSIS — Z79891 Long term (current) use of opiate analgesic: Secondary | ICD-10-CM

## 2015-07-08 DIAGNOSIS — E669 Obesity, unspecified: Secondary | ICD-10-CM

## 2015-07-08 DIAGNOSIS — M545 Low back pain, unspecified: Secondary | ICD-10-CM

## 2015-07-08 DIAGNOSIS — N952 Postmenopausal atrophic vaginitis: Secondary | ICD-10-CM

## 2015-07-08 DIAGNOSIS — F1120 Opioid dependence, uncomplicated: Secondary | ICD-10-CM

## 2015-07-08 DIAGNOSIS — N760 Acute vaginitis: Secondary | ICD-10-CM | POA: Insufficient documentation

## 2015-07-08 MED ORDER — FUROSEMIDE 20 MG PO TABS: 20.0000 mg | ORAL_TABLET | Freq: Every day | ORAL | Status: DC

## 2015-07-08 MED ORDER — FLUOCINONIDE 0.05 % EX CREA: TOPICAL_CREAM | CUTANEOUS | Status: DC

## 2015-07-08 NOTE — Assessment & Plan Note (Signed)
On amlodipine 45 mg, lasix 20 QD, and losartan. When I last refilled lasix, it was for only 30. Pt tells me today that occasionally she takes an extra lasix at night 2/2 LE edema and therefore used to getting 45 monthly. I changed RX and requested a one month F/U for BP and BMP. BP marginally low and if gets lower would stop the amlodipine (LE edema). K when checked by ED in April was low at 3.4 and I am concerned that with increased lasix use, her KCl 10 might not be enough.

## 2015-07-08 NOTE — Assessment & Plan Note (Signed)
She states that whenever she stops the allopurinol, her big toe starts to hurt. Uric acid 4.2 in 2011.

## 2015-07-08 NOTE — Telephone Encounter (Signed)
error 

## 2015-07-08 NOTE — Assessment & Plan Note (Signed)
See "HTN"

## 2015-07-08 NOTE — Assessment & Plan Note (Signed)
We stopped her premarin cream at our first appt bc she wanted to decrease meds and didn't think it helped. Now with vaginal irritation. Using Vicks on genitals to help. Has gotten diflucan in past that helped. Wet prep today to see if candidal or BV (has had both in past). If not, we can restarted her premarin.

## 2015-07-08 NOTE — Assessment & Plan Note (Signed)
Her sxs of throat closing off after nightly Alb use has resolved.

## 2015-07-08 NOTE — Progress Notes (Signed)
   Subjective:    Patient ID: [name], female    DOB: 08-17-1941, 74 y.o.   MRN: [id number]  HPI  [name] is here for ankle rash. Please see the A&P for the status of the pt's chronic medical problems.  Review of Systems  Constitutional: Negative for unexpected weight change.  Respiratory: Negative for shortness of breath.   Cardiovascular: Positive for leg swelling.  Gastrointestinal: Negative for abdominal pain.  Genitourinary: Positive for frequency and vaginal pain.  Musculoskeletal: Positive for back pain and gait problem.  Skin: Positive for rash.  Psychiatric/Behavioral: Positive for sleep disturbance.       Objective:   Physical Exam  Constitutional: She appears well-developed and well-nourished. No distress.  HENT:  Head: Normocephalic and atraumatic.  Right Ear: External ear normal.  Left Ear: External ear normal.  Nose: Nose normal.  Eyes: Conjunctivae and EOM are normal.  Genitourinary: Vagina normal. No vaginal discharge found.  No external abnl Cervix not visualized - pt states TAH Scant, nl vaginal d/c  Musculoskeletal: She exhibits edema.  Neurological: She is alert.  Skin: She is not diaphoretic.  Psychiatric: She has a normal mood and affect. Her behavior is normal. Judgment and thought content normal.  Skin - see A&P        Assessment & Plan:

## 2015-07-08 NOTE — Assessment & Plan Note (Signed)
Her hand pain has resolved. She has long standing LBP - see overview for details. Had been well controlled for some time but flaring past 3-4 months - she thinks possibly bc leaning over to scratch ankle. Just mid low lumbar pain without radiation. Leaning forward makes it better. Uses tramadol one in AM, one in PM bu took 2 last PM. Also advil PRN, but not everyday. Pain flare not preceded by fall or other injury. No current weakness or falls. Had back brace that would stop pain but it was lost. Requests new back brace.   PLAN 1. Cont tramadol 2. PT for back brace 3. If no better, MRI to assess for stenosis. HNP unlikely as no radiation.

## 2015-07-08 NOTE — Assessment & Plan Note (Signed)
Reviewed most recent note by her psychiatrist.

## 2015-07-08 NOTE — Assessment & Plan Note (Signed)
She has brought in box of OTC weight loss pills which contained caffeine. I rec that she do NOT take them. Explained concerns with caffeine and no FDA oversight. She wants to be back at 190 (weight in 2014 / 15) now 215. She is OK with referral to [facility].

## 2015-07-08 NOTE — Patient Instructions (Signed)
1. I will let you know what your test results are 2. I sent in water pill and cream 3. See physical therapy for back brace 4. See [place] in one month for blood pressure and blood test

## 2015-07-09 ENCOUNTER — Encounter: Payer: Self-pay | Admitting: Internal Medicine

## 2015-07-09 DIAGNOSIS — L209 Atopic dermatitis, unspecified: Secondary | ICD-10-CM | POA: Insufficient documentation

## 2015-07-09 LAB — CERVICOVAGINAL ANCILLARY ONLY: Wet Prep (BD Affirm): NEGATIVE

## 2015-07-09 NOTE — Assessment & Plan Note (Addendum)
States 3-4 yrs ago saw dermatologist on [address] [employer] who dx a skin rash and gave her Fluocinonide cream but she doesn't remember what the rash was. The daughter thinks it was psoriasis and mentioned scales on her ankle. She states the derm arranged with Dr Obie Dredge to cont to Rx the cream but there is not a single note from Dr Obie Dredge about the rash. The derm notes are not in EPIC either. The cream was cont to be rx until I recently refused to refill it bc there was no indication of need ever recorded. She was not happy that I required an appt. Today, she gave me the hx above. States it is seasonal and has flaired for 3-4 months and is extremely itchy. Has been using moisturizer creams and Vaseline and oils and partially better. On exam, there is some hyperpigmentation but no scales that I can see. The area is around the R medial ankle. No other involvement. I will refill the cream as the ankle would be thick skin, able to tolerate the steroid. Would like to get dermatology notes - forgot to get release this appt - will next.

## 2015-07-19 NOTE — Addendum Note (Signed)
Addended by: Hulan Fray on: 07/19/2015 05:02 PM   Modules accepted: Orders

## 2015-07-27 ENCOUNTER — Ambulatory Visit (INDEPENDENT_AMBULATORY_CARE_PROVIDER_SITE_OTHER): Payer: Medicare HMO | Admitting: Dietician

## 2015-07-27 ENCOUNTER — Other Ambulatory Visit (HOSPITAL_COMMUNITY): Payer: Self-pay | Admitting: Psychiatry

## 2015-07-27 ENCOUNTER — Other Ambulatory Visit: Payer: Self-pay | Admitting: Internal Medicine

## 2015-07-27 VITALS — Ht 63.5 in | Wt 213.7 lb

## 2015-07-27 DIAGNOSIS — Z713 Dietary counseling and surveillance: Secondary | ICD-10-CM | POA: Diagnosis not present

## 2015-07-27 DIAGNOSIS — E669 Obesity, unspecified: Secondary | ICD-10-CM | POA: Diagnosis not present

## 2015-07-27 DIAGNOSIS — Z6837 Body mass index (BMI) 37.0-37.9, adult: Secondary | ICD-10-CM

## 2015-07-27 NOTE — Progress Notes (Signed)
Medical Nutrition Therapy:  Appt start time: 1435 end time:  7169. Initial Visit- plan a series of 3-4, then reevaluate  Assessment:  Primary concerns today: weight loss.  Patient is here today with her daughter for help with weight loss. She says she has always been fat, so has not desire to be less than 190#, but feels better at this weight. She says her problem is sweets and sugar intake and from her food recall this sounds accurate. She also has a deficit of vegetables ( < 1x/day) and fiber.  Preferred Learning Style: Auditory and Visual . She uses a rolling walker to ambulate. She reports very little activity and reports thinking about retrying Water aerobics,but has trouble with transportation and doesn't want to bother with the transportation that is available to her. (? [employer]? ). Her daughter and her aid do her grocery shopping and she and her aid do the food preparation. Her daughter brings her take out chinese or salads food every other week. Learning Readiness: Contemplating and Ready WEIGHT: SLEEP: broken, but good and she awakens feeling rested. Sleeps from 1Am, to 4-5 am, then 6 Am until 9-11 am MEDICATIONS: bupropion, senekot, calcium with vit D DIETARY INTAKE: Usual eating pattern includes 2 meals and  snacks throughout the  day. Everyday foods include candy, soda, juice, sandwiches, chips  Avoided foods include artificial sweeteners.    24-hr recall:  B ( 12-1 PM: ~ 1 cup frosted flakes with whole milk and fruit, 1 12 oz mug of coffee with international creamer and 3-4 tsp sugar, 8 oz orange juice Snk ( AM): juice 12 ounces L ( PM): does not eat Snk ( PM): lemon drops, 12 oz juice D ( PM): ham or chicken salad or meatloaf sandwich or tv dinner, regular can pepsi Snk ( PM): ice cream, cupcakes, po  und cake Beverages: water, coffee, juice- 32 oz /day, soda- 12 oz/day, coffee with sugar  Usual physical activity: sedentary most of day  Estimated energy needs: 1800 calories ~  200 g carbohydrates 75-95 g protein   Progress Towards Goal(s):  In progress.   Nutritional Diagnosis:  NI-1.5 Excessive energy intake As related to excess sugar intake.  As evidenced by her food recall with excess sweets and report of candy and sugared drinks.    Intervention:  Nutrition education about weight loss and the need for less sugar both for weight and health. Also encouraged activity as she is capable  Teaching Method Utilized: Visual, Auditory, Hands on Handouts given during visit include: AVS Barriers to learning/adherence to lifestyle change: her support systems supporting her desire to make changes as well as her high stress Demonstrated degree of understanding via:  Teach Back   Monitoring/Evaluation:  Dietary intake, exercise, and body weight in 3 week(s).

## 2015-07-27 NOTE — Patient Instructions (Signed)
Your plan for the next 3 weeks is to help you lose weight is to:  1- try drinking vegetable juice or water or unsweetened tea instead of soda or juice.  2- Put snap peas and greens on your shopping list to try to help you eat less cake, candy   3- Remember out of sight out of mind   and on the reverse- if you want yourself to eat something- make is ahead of time

## 2015-07-28 ENCOUNTER — Telehealth: Payer: Self-pay | Admitting: *Deleted

## 2015-07-28 NOTE — Telephone Encounter (Signed)
Talked with pt - needs Rx for back brace. Talked with Dr Lynnae January this AM and aware [facility] can not access for back brace - called PT/[address]. Pt aware Dr Lynnae January suggest next appt in Oct - Dr Lynnae January will discuss with pt. Pt states she can not wait till Oct and wants appt 07/29/15. Then pt states she needs an appt  07/30/15 in PM. Bertram Denver talked with pt. Hilda Blades Tyra Michelle RN 07/28/15 3:50PM

## 2015-07-28 NOTE — Telephone Encounter (Signed)
Filled 3. Al others - has RF at pharmacy

## 2015-07-29 ENCOUNTER — Other Ambulatory Visit (HOSPITAL_COMMUNITY): Payer: Self-pay | Admitting: Psychiatry

## 2015-07-29 ENCOUNTER — Ambulatory Visit: Payer: Self-pay | Admitting: Internal Medicine

## 2015-08-05 ENCOUNTER — Ambulatory Visit (HOSPITAL_COMMUNITY): Payer: Self-pay | Admitting: Clinical

## 2015-08-17 ENCOUNTER — Ambulatory Visit: Payer: Self-pay | Admitting: Dietician

## 2015-08-24 ENCOUNTER — Other Ambulatory Visit: Payer: Self-pay | Admitting: Internal Medicine

## 2015-08-24 ENCOUNTER — Other Ambulatory Visit (HOSPITAL_COMMUNITY): Payer: Self-pay | Admitting: Psychiatry

## 2015-08-24 NOTE — Telephone Encounter (Signed)
Year supply Rx 07/28/15

## 2015-09-06 ENCOUNTER — Other Ambulatory Visit: Payer: Self-pay | Admitting: Internal Medicine

## 2015-09-06 NOTE — Telephone Encounter (Signed)
Pt want to know if she can take one table of calcium- vitamin D instead of two. Please call pt back.

## 2015-09-06 NOTE — Telephone Encounter (Signed)
Per dosing instructions pt should only take 1 tablet per day.  Spoke with patient, she understands instructions, will only take 1 tablet.

## 2015-09-22 ENCOUNTER — Other Ambulatory Visit: Payer: Self-pay | Admitting: Internal Medicine

## 2015-09-22 ENCOUNTER — Other Ambulatory Visit (HOSPITAL_COMMUNITY): Payer: Self-pay | Admitting: Psychiatry

## 2015-09-26 ENCOUNTER — Other Ambulatory Visit (HOSPITAL_COMMUNITY): Payer: Self-pay | Admitting: Psychiatry

## 2015-09-28 ENCOUNTER — Telehealth: Payer: Self-pay | Admitting: Internal Medicine

## 2015-09-28 NOTE — Telephone Encounter (Signed)
  Reason for call:   I placed an outgoing call to Ms. [name] at 1:16AM.  She tells me that her right ear was bothering her and she thought she put some sweet oil in her ear but instead she put some wintergreen oil.  She immediately realized this and took the sprayer and sprayed her ear with the hose.  She denied any other complaints.  She was very concerned that this may damage her ear or "go to her brain."  I explained that I would not anticipate any bad effects from her putting this wintergreen oil in her ear.  I also suggested that she take some cotton (NOT a q-tip) and try to remove the excess oil from her ear but do not place it in the ear canal.  She voiced understanding and was appreciative of the return call.     Jones Bales, MD   09/28/2015, 1:16 AM

## 2015-09-28 NOTE — Telephone Encounter (Signed)
Cancelled 1 month F/U in Sep. Needs [facility] appt HTN check and BMP

## 2015-09-29 NOTE — Telephone Encounter (Signed)
called to pharm

## 2015-09-30 ENCOUNTER — Ambulatory Visit (HOSPITAL_COMMUNITY): Payer: Self-pay | Admitting: Psychiatry

## 2015-09-30 ENCOUNTER — Ambulatory Visit (INDEPENDENT_AMBULATORY_CARE_PROVIDER_SITE_OTHER): Payer: Medicare HMO | Admitting: Psychiatry

## 2015-09-30 ENCOUNTER — Encounter (HOSPITAL_COMMUNITY): Payer: Self-pay | Admitting: Psychiatry

## 2015-09-30 VITALS — BP 116/78 | HR 79 | Ht 63.0 in | Wt 216.0 lb

## 2015-09-30 DIAGNOSIS — F200 Paranoid schizophrenia: Secondary | ICD-10-CM

## 2015-09-30 MED ORDER — BUPROPION HCL ER (XL) 300 MG PO TB24: 300.0000 mg | ORAL_TABLET | Freq: Every morning | ORAL | Status: DC

## 2015-09-30 MED ORDER — HALOPERIDOL 1 MG PO TABS: ORAL_TABLET | ORAL | Status: DC

## 2015-09-30 NOTE — Progress Notes (Signed)
[place] Progress Note  [name] [id number] y.o.  09/30/2015 3:04 PM  Chief Complaint: Medication management and follow-up.    History of Present Illness: [name] came for her followup appointment.  She is stable on her medications. Her sleep is good. Her daughter is doing well.  She admitted some time confused about the medication because she is taking too many medication.  She get home health aid but still requires a lot of assistance to organize her pill box.  Some time her daughter helps her.  She likes Haldol.  It is helping her paranoia and hallucination.  She denies any agitation, anger, irritability she still have some time rambling speech and circumstantiality.  She has no EPS or tremors.  She missed appointment with [name] would like to be scheduled appointment again.  Patient denies drinking or using any illegal substances.  Her appetite is okay.  Her vitals are stable.  Suicidal Ideation: No Plan Formed: No Patient has means to carry out plan: No  Homicidal Ideation: No Plan Formed: No Patient has means to carry out plan: No  Review of Systems  Respiratory: Negative.   Skin: Negative.   Neurological: Negative for tremors.  Psychiatric/Behavioral: Negative for suicidal ideas.    Psychiatric: Agitation: No Hallucination: No Depressed Mood: No Insomnia: No Hypersomnia: No Altered Concentration: No Feels Worthless: No Grandiose Ideas: No Belief In Special Powers: No New/Increased Substance Abuse: No Compulsions: No  Neurologic: Headache: No Seizure: No Paresthesias: No  Past Medical Family, Social History:  Patient was born and raised in [place]. She had 2 sons and one daughter.  Both her sons are in jail. Patient had another son who committed suicide in 04/03/12 .  Patient husband died many years ago.  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  Patient endorsed grandfather and brother  has significant psychiatric illness.  His brother died in a mental health hospital in [place].  Patient has multiple health problems.  She has GERD, gout, hypertension, degenerative joint disease, osteoarthritis, TIA and bilateral hearing loss.  She has history of mitral valve prolapse, chronic venous insufficiency, diverticulitis hyperlipidemia obesity and seasonal allergies. [name].    Outpatient Encounter Prescriptions as of 09/30/2015  Medication Sig  . albuterol (PROVENTIL HFA;VENTOLIN HFA) 108 (90 BASE) MCG/ACT inhaler Inhale 2 puffs into the lungs every 6 (six) hours as needed for wheezing or shortness of breath.  . allopurinol (ZYLOPRIM) 300 MG tablet TAKE 1 TABLET BY MOUTH DAILY  . amLODipine (NORVASC) 5 MG tablet TAKE 2 TABLETS BY MOUTH EVERY DAY  . aspirin EC 81 MG tablet Take 81 mg by mouth daily.  [name] atorvastatin (LIPITOR) 40 MG tablet TAKE 1 TABLET BY MOUTH EVERY DAY  . buPROPion (WELLBUTRIN XL) 300 MG 24 hr tablet Take 1 tablet (300 mg total) by mouth every morning.  . Calcium Carbonate-Vitamin D (CALCIUM-VITAMIN D) 500-200 MG-UNIT per tablet Take 1 tablet by mouth daily.    [name] docusate sodium (COLACE) 100 MG capsule Take 1 capsule (100 mg total) by mouth every 12 (twelve) hours.  . fluocinonide cream (LIDEX) 0.05 % APPLY TO AFFECTED AREA TWICE DAILY  . fluticasone (FLONASE) 50 MCG/ACT nasal spray INHALE 2 SPRAYS IN EACH NOSTRIL ONCE DAILY  . furosemide (LASIX) 20 MG tablet Take 1 tablet (20 mg total) by mouth daily.  . haloperidol (HALDOL) 1 MG tablet TAKE ONE TABLET  BY MOUTH DAILY AT BEDTIME.  [name] losartan (COZAAR) 100 MG tablet TAKE ONE TABLET BY MOUTH DAILY  . nystatin (MYCOSTATIN) powder APPLY TO AFFECTED AREA TWICE DAILY  . oxybutynin (DITROPAN-XL) 5 MG 24 hr tablet TAKE ONE TABLET BY MOUTH AT BEDTIME  . pantoprazole (PROTONIX) 40 MG tablet Take 1 tablet (40 mg total) by mouth 2 (two) times daily before a meal.  . potassium  chloride (MICRO-K) 10 MEQ CR capsule TAKE 1 CAPSULE BY MOUTH DAILY  . senna (SENOKOT) 8.6 MG TABS tablet Take 1 tablet by mouth every other day.  . traMADol (ULTRAM) 50 MG tablet TAKE 1 TABLET BY MOUTH EVERY 12 HOURS AS NEEDED  . [DISCONTINUED] buPROPion (WELLBUTRIN XL) 300 MG 24 hr tablet Take 1 tablet (300 mg total) by mouth every morning.  . [DISCONTINUED] haloperidol (HALDOL) 1 MG tablet TAKE ONE TABLET BY MOUTH DAILY AT BEDTIME.   No facility-administered encounter medications on file as of 09/30/2015.    Past Psychiatric History/Hospitalization(s): Patient admitted history of one psychiatric hospitalization many years ago. Patient remember she was trying to jump off a window when her husband saved her. She was admitted at [facility] for 3 weeks.  She was seen by this writer in 2013 and given Haldol which helped her but patient stopped taking the medication because she was feeling better.   Anxiety: Yes Bipolar Disorder: No Depression: Yes Mania: No Psychosis: Yes Schizophrenia: Yes Personality Disorder: No Hospitalization for psychiatric illness: Yes History of Electroconvulsive Shock Therapy: No Prior Suicide Attempts: Patient tried to jump off from the window but her husband rescued her.  Physical Exam: Constitutional:  BP 116/78 mmHg  Pulse 79  Ht 5\' 3"  (1.6 m)  Wt 216 lb (97.977 kg)  BMI 38.27 kg/m2  Recent Results (from the past 2160 hour(s))  Cervicovaginal ancillary only     Status: None   Collection Time: 07/08/15 12:00 AM  Result Value Ref Range   Wet Prep (BD Affirm) Negative for Candida, Gardnerella, & Trichomonas     Comment: Normal Reference Range - Negative    General Appearance: obese and Casually dressed.  She has difficulty walking.  Musculoskeletal: Strength & Muscle Tone: Patient uses walker for ambulation.  She has chronic pain. Gait & Station: unsteady Patient leans: Front and Backward  Mental status examination Patient is casually dressed  and fairly groomed.  She maintained fair eye contact.  She is superficially cooperative .  She describes her mood good and her affect is labile.  Her speech is fast and at times rambling.  Her thought process is circumstantial.  She denies any auditory or visual hallucination.  She denies any paranoia or any delusions.  She is still has some flight of ideas .  Her attention and concentration is distracted.  She denies any active or passive suicidal thoughts or homicidal thoughts.  Her fund of knowledge is below average.  She is alert and oriented x3.  Her insight judgment and impulse control is okay.  Established Problem, Stable/Improving (1), Review of Psycho-Social Stressors (1), Review of Last Therapy Session (1) and Review of Medication Regimen & Side Effects (2)  Assessment: Axis I:  Psychosis NOS, rule out paranoid schizophrenia, rule out major depressive disorder with psychosis, history of alcohol abuse  Axis II: deferred  Axis III:  see medical history  Plan:   Patient is taking her medication and fairly stable on low-dose Haldol and Wellbutrin.  In the past she had tried higher dose of Haldol but it  makes her very sedated and drowsy.  I will continue Haldol 1 mg at bedtime and Wellbutrin XL 300 mg daily.  I encourage to have her daughter more involved in her treatment plan and organize her pillbox.  Discussed medication side effects and benefits.  Encouraged to keep appointment with Joaquim Lai.  Recommended to call us back if she has any question or any concern.  Follow-up in 3 months.  ARFEEN,SYED T., MD 09/30/2015

## 2015-10-20 ENCOUNTER — Ambulatory Visit: Payer: Self-pay | Admitting: Internal Medicine

## 2015-10-21 ENCOUNTER — Ambulatory Visit: Payer: Self-pay | Admitting: Internal Medicine

## 2015-11-23 DIAGNOSIS — M542 Cervicalgia: Secondary | ICD-10-CM | POA: Diagnosis not present

## 2015-11-23 DIAGNOSIS — M25552 Pain in left hip: Secondary | ICD-10-CM | POA: Diagnosis not present

## 2015-11-23 DIAGNOSIS — M25551 Pain in right hip: Secondary | ICD-10-CM | POA: Diagnosis not present

## 2015-11-23 DIAGNOSIS — M545 Low back pain: Secondary | ICD-10-CM | POA: Diagnosis not present

## 2015-11-30 ENCOUNTER — Other Ambulatory Visit: Payer: Self-pay | Admitting: Internal Medicine

## 2015-11-30 ENCOUNTER — Other Ambulatory Visit (HOSPITAL_COMMUNITY): Payer: Self-pay | Admitting: Psychiatry

## 2015-12-01 NOTE — Telephone Encounter (Signed)
Hasn't been since since Sept, couple of cancellations.  I will try to get her in for follow-up

## 2015-12-01 NOTE — Telephone Encounter (Signed)
Last seen Sept and requested one month F/U. Cancelled next 3 appts. I have openings late Feb and all March. I refilled but must make and keep appt to receive any more RF.

## 2015-12-21 ENCOUNTER — Other Ambulatory Visit: Payer: Self-pay | Admitting: Orthopaedic Surgery

## 2015-12-21 DIAGNOSIS — M542 Cervicalgia: Secondary | ICD-10-CM

## 2015-12-21 DIAGNOSIS — M545 Low back pain: Secondary | ICD-10-CM

## 2015-12-27 ENCOUNTER — Other Ambulatory Visit (HOSPITAL_COMMUNITY): Payer: Self-pay | Admitting: Psychiatry

## 2015-12-28 ENCOUNTER — Other Ambulatory Visit: Payer: Self-pay | Admitting: Internal Medicine

## 2015-12-28 ENCOUNTER — Other Ambulatory Visit (HOSPITAL_COMMUNITY): Payer: Self-pay | Admitting: Psychiatry

## 2015-12-29 NOTE — Telephone Encounter (Signed)
30 day supply given. Must be seen before more Rx

## 2015-12-29 NOTE — Telephone Encounter (Signed)
Have tried again to get her scheduled.

## 2015-12-29 NOTE — Telephone Encounter (Signed)
Phoned in tramadol

## 2015-12-30 ENCOUNTER — Encounter (HOSPITAL_COMMUNITY): Payer: Self-pay | Admitting: Psychiatry

## 2015-12-30 ENCOUNTER — Ambulatory Visit (INDEPENDENT_AMBULATORY_CARE_PROVIDER_SITE_OTHER): Payer: Medicare HMO | Admitting: Psychiatry

## 2015-12-30 VITALS — BP 124/68 | HR 86 | Ht 63.0 in | Wt 220.8 lb

## 2015-12-30 DIAGNOSIS — F200 Paranoid schizophrenia: Secondary | ICD-10-CM | POA: Diagnosis not present

## 2015-12-30 MED ORDER — HALOPERIDOL 1 MG PO TABS: ORAL_TABLET | ORAL | Status: DC

## 2015-12-30 MED ORDER — BUPROPION HCL ER (XL) 300 MG PO TB24: 300.0000 mg | ORAL_TABLET | Freq: Every morning | ORAL | Status: DC

## 2015-12-30 NOTE — Progress Notes (Signed)
[facility] [phone] Progress Note  KYNSIE GOERKE [id number] y.o.  12/30/2015 2:37 PM  Chief Complaint: Medication management and follow-up.    History of Present Illness: [name] came for her followup appointment.  She is complaining of back pain and joint pain.  She has difficulty walking .  She scheduled to have MRI tomorrow and then she will see orthopedic next week.  She admitted lately difficulty remembering things.  She did not come to the appointment to see a therapist because she forgot.  She told recently anniversary for her deceased son who committed suicide 3 years ago.  She was feeling sad and depressed but she had a good support from her daughter.  She is taking Haldol 1 mg at bedtime and Wellbutrin 300 mg in the morning.  She is sleeping at least 5-6 hours.  We have recommended to increase the dose Haldol and she tried but she feels very sedated next day and she does not want to take higher dose of Haldol.  Her thought processes remains circumstantial.  She appears very labile.  However she denies any paranoia, hallucination and reported no recent delusions or agitation.  Her attention and concentration remains poor.  She is easily forgetful.  She has no tremors, shakes or any EPS.  Her appetite is okay.  Her vitals are stable.  Patient denies drinking or using any illegal substances.  She lives by herself however her daughter and she had a home health aid who comes regularly and assist to organize her pill box.  Suicidal Ideation: No Plan Formed: No Patient has means to carry out plan: No  Homicidal Ideation: No Plan Formed: No Patient has means to carry out plan: No  Review of Systems  Respiratory: Negative.   Musculoskeletal: Positive for back pain and joint pain.  Skin: Negative.   Neurological: Negative for tremors.  Psychiatric/Behavioral: Negative for suicidal ideas.    Psychiatric: Agitation: No Hallucination: No Depressed Mood: No Insomnia:  No Hypersomnia: No Altered Concentration: No Feels Worthless: No Grandiose Ideas: No Belief In Special Powers: No New/Increased Substance Abuse: No Compulsions: No  Neurologic: Headache: No Seizure: No Paresthesias: No  Past Medical Family, Social History:  Patient was born and raised in [place]. She had 2 sons and one daughter.  Both her sons are in jail. Patient had another son who committed suicide in April 14, 2012 .  Patient husband died many years ago.  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  His brother died in a mental health hospital in [place].  Patient has multiple health problems.  She has GERD, gout, hypertension, degenerative joint disease, osteoarthritis, TIA and bilateral hearing loss.  She has history of mitral valve prolapse, chronic venous insufficiency, diverticulitis hyperlipidemia obesity and seasonal allergies. [facility].    Outpatient Encounter Prescriptions as of 12/30/2015  Medication Sig  . albuterol (PROVENTIL HFA;VENTOLIN HFA) 108 (90 BASE) MCG/ACT inhaler Inhale 2 puffs into the lungs every 6 (six) hours as needed for wheezing or shortness of breath.  . allopurinol (ZYLOPRIM) 300 MG tablet TAKE 1 TABLET BY MOUTH DAILY  . amLODipine (NORVASC) 5 MG tablet TAKE 2 TABLETS BY MOUTH EVERY DAY  . aspirin EC 81 MG tablet Take 81 mg by mouth daily.  [name] atorvastatin (LIPITOR) 40 MG tablet TAKE 1 TABLET BY MOUTH EVERY DAY  .  buPROPion (WELLBUTRIN XL) 300 MG 24 hr tablet Take 1 tablet (300 mg total) by mouth every morning.  . Calcium Carbonate-Vitamin D (CALCIUM-VITAMIN D) 500-200 MG-UNIT per tablet Take 1 tablet by mouth daily.    [name] docusate sodium (COLACE) 100 MG capsule Take 1 capsule (100 mg total) by mouth every 12 (twelve) hours.  . fluocinonide cream (LIDEX) 0.05 % APPLY TO AFFECTED AREA TWICE  DAILY  . fluticasone (FLONASE) 50 MCG/ACT nasal spray INSTILL 2 SPRAYS IN EACH NOSTRIL DAILY  . furosemide (LASIX) 20 MG tablet Take 1 tablet (20 mg total) by mouth daily.  . haloperidol (HALDOL) 1 MG tablet TAKE ONE TABLET BY MOUTH DAILY AT BEDTIME.  [name] losartan (COZAAR) 100 MG tablet TAKE ONE TABLET BY MOUTH DAILY  . nystatin (MYCOSTATIN) powder APPLY TO AFFECTED AREA TWICE DAILY  . oxybutynin (DITROPAN-XL) 5 MG 24 hr tablet TAKE ONE TABLET BY MOUTH AT BEDTIME  . pantoprazole (PROTONIX) 40 MG tablet TAKE 1 TABLET BY MOUTH TWICE DAILY BEFORE A MEAL  . potassium chloride (MICRO-K) 10 MEQ CR capsule TAKE 1 CAPSULE BY MOUTH DAILY  . senna (SENOKOT) 8.6 MG TABS tablet Take 1 tablet by mouth every other day.  . traMADol (ULTRAM) 50 MG tablet TAKE 1 TABLET BY MOUTH EVERY 12 HOURS AS NEEDED  . [DISCONTINUED] buPROPion (WELLBUTRIN XL) 300 MG 24 hr tablet Take 1 tablet (300 mg total) by mouth every morning.  . [DISCONTINUED] haloperidol (HALDOL) 1 MG tablet TAKE ONE TABLET BY MOUTH DAILY AT BEDTIME.   No facility-administered encounter medications on file as of 12/30/2015.    Past Psychiatric History/Hospitalization(s): Patient admitted history of one psychiatric hospitalization many years ago. Patient remember she was trying to jump off a window when her husband saved her. She was admitted at [facility] for 3 weeks.  She was seen by this writer in 2013 and given Haldol which helped her but patient stopped taking the medication because she was feeling better.   Anxiety: Yes Bipolar Disorder: No Depression: Yes Mania: No Psychosis: Yes Schizophrenia: Yes Personality Disorder: No Hospitalization for psychiatric illness: Yes History of Electroconvulsive Shock Therapy: No Prior Suicide Attempts: Patient tried to jump off from the window but her husband rescued her.  Physical Exam: Constitutional:  There were no vitals taken for this visit.  No results found for this or any previous visit (from  the past 2160 hour(s)).  General Appearance: obese and Casually dressed.  She has difficulty walking.  Musculoskeletal: Strength & Muscle Tone: Patient uses walker for ambulation.  She has chronic pain. Gait & Station: unsteady Patient leans: Front and Backward  Mental status examination Patient is casually dressed and fairly groomed.  She maintained fair eye contact.  She is cooperative .  She describes her mood anxious and her affect is labile.  Her speech is fast and at times rambling.  Her thought process is circumstantial.  She denies any auditory or visual hallucination.  She denies any paranoia or any delusions.  She is still has some flight of ideas .  Her attention and concentration is distracted.  She denies any active or passive suicidal thoughts or homicidal thoughts.  Her fund of knowledge is below average.  Her cognition is mildly impaired.  She is alert and oriented x3.  Her insight judgment and impulse control is okay.  Established Problem, Stable/Improving (1), Review of Psycho-Social Stressors (1), Review of Last Therapy Session (1) and Review of Medication Regimen & Side Effects (2)  Assessment: Axis I:  Psychosis NOS, rule out paranoid schizophrenia, rule out major depressive disorder with psychosis, history of alcohol abuse  Axis II: deferred  Axis III:  see medical history  Plan:   Patient is taking her medication and fairly stable on low-dose Haldol and Wellbutrin.  In the past she had tried higher dose of Haldol but it makes her very sedated and drowsy.  She is reluctant to take any other medication.  I will continue Haldol 1 mg at bedtime and Wellbutrin XL 300 mg daily.  I encourage to have her daughter more involved in her treatment plan and organize her pillbox.  Discussed medication side effects and benefits.  At this time patient is not interested in counseling and she liked to have her physical illness under control.  She had a MRI tomorrow and she is going to see  orthopedic doctor next week.  Recommended to call us back if she has any question or any concern.  Follow-up in 3 months.  Kehinde Totzke T., MD 12/30/2015

## 2015-12-31 ENCOUNTER — Ambulatory Visit
Admission: RE | Admit: 2015-12-31 | Discharge: 2015-12-31 | Disposition: A | Discharge status: Home / Self Care | Payer: Medicare HMO | Source: Ambulatory Visit | Attending: Orthopaedic Surgery | Admitting: Orthopaedic Surgery

## 2015-12-31 DIAGNOSIS — M542 Cervicalgia: Secondary | ICD-10-CM

## 2015-12-31 DIAGNOSIS — M545 Low back pain: Secondary | ICD-10-CM

## 2015-12-31 DIAGNOSIS — M5136 Other intervertebral disc degeneration, lumbar region: Secondary | ICD-10-CM | POA: Diagnosis not present

## 2015-12-31 DIAGNOSIS — M47812 Spondylosis without myelopathy or radiculopathy, cervical region: Secondary | ICD-10-CM | POA: Diagnosis not present

## 2016-01-04 DIAGNOSIS — M542 Cervicalgia: Secondary | ICD-10-CM | POA: Diagnosis not present

## 2016-01-04 DIAGNOSIS — M25552 Pain in left hip: Secondary | ICD-10-CM | POA: Diagnosis not present

## 2016-01-04 DIAGNOSIS — M545 Low back pain: Secondary | ICD-10-CM | POA: Diagnosis not present

## 2016-01-04 DIAGNOSIS — M25551 Pain in right hip: Secondary | ICD-10-CM | POA: Diagnosis not present

## 2016-01-05 ENCOUNTER — Encounter: Payer: Self-pay | Admitting: Internal Medicine

## 2016-01-13 ENCOUNTER — Other Ambulatory Visit: Payer: Self-pay | Admitting: Orthopaedic Surgery

## 2016-01-13 ENCOUNTER — Other Ambulatory Visit: Payer: Self-pay | Admitting: Orthopedic Surgery

## 2016-01-24 ENCOUNTER — Telehealth: Payer: Self-pay | Admitting: Internal Medicine

## 2016-01-24 NOTE — Telephone Encounter (Signed)
APPT. REMINDER CALL, LMTCB °

## 2016-01-25 ENCOUNTER — Ambulatory Visit (INDEPENDENT_AMBULATORY_CARE_PROVIDER_SITE_OTHER): Payer: PPO | Admitting: Internal Medicine

## 2016-01-25 ENCOUNTER — Encounter: Payer: Self-pay | Admitting: Internal Medicine

## 2016-01-25 ENCOUNTER — Ambulatory Visit (HOSPITAL_COMMUNITY)
Admission: RE | Admit: 2016-01-25 | Discharge: 2016-01-25 | Disposition: A | Discharge status: Home / Self Care | Payer: PPO | Source: Ambulatory Visit | Attending: Orthopaedic Surgery | Admitting: Orthopaedic Surgery

## 2016-01-25 ENCOUNTER — Encounter (HOSPITAL_COMMUNITY)
Admission: RE | Admit: 2016-01-25 | Discharge: 2016-01-25 | Disposition: A | Discharge status: Home / Self Care | Payer: PPO | Source: Ambulatory Visit | Attending: Orthopaedic Surgery | Admitting: Orthopaedic Surgery

## 2016-01-25 VITALS — BP 125/56 | HR 74 | Temp 98.0°F | Ht 63.0 in | Wt 219.3 lb

## 2016-01-25 DIAGNOSIS — I499 Cardiac arrhythmia, unspecified: Secondary | ICD-10-CM | POA: Diagnosis not present

## 2016-01-25 DIAGNOSIS — Z01818 Encounter for other preprocedural examination: Secondary | ICD-10-CM | POA: Insufficient documentation

## 2016-01-25 DIAGNOSIS — I10 Essential (primary) hypertension: Secondary | ICD-10-CM | POA: Diagnosis not present

## 2016-01-25 DIAGNOSIS — Z01812 Encounter for preprocedural laboratory examination: Secondary | ICD-10-CM | POA: Diagnosis not present

## 2016-01-25 DIAGNOSIS — Z01810 Encounter for preprocedural cardiovascular examination: Secondary | ICD-10-CM | POA: Diagnosis not present

## 2016-01-25 DIAGNOSIS — K219 Gastro-esophageal reflux disease without esophagitis: Secondary | ICD-10-CM

## 2016-01-25 DIAGNOSIS — M4806 Spinal stenosis, lumbar region: Secondary | ICD-10-CM | POA: Diagnosis not present

## 2016-01-25 DIAGNOSIS — I493 Ventricular premature depolarization: Secondary | ICD-10-CM | POA: Insufficient documentation

## 2016-01-25 DIAGNOSIS — M48061 Spinal stenosis, lumbar region without neurogenic claudication: Secondary | ICD-10-CM

## 2016-01-25 LAB — COMPREHENSIVE METABOLIC PANEL
ALT: 32 U/L (ref 14–54)
AST: 30 U/L (ref 15–41)
Albumin: 3.7 g/dL (ref 3.5–5.0)
Alkaline Phosphatase: 115 U/L (ref 38–126)
Anion gap: 8 (ref 5–15)
BUN: 13 mg/dL (ref 6–20)
CO2: 25 mmol/L (ref 22–32)
Calcium: 9.6 mg/dL (ref 8.9–10.3)
Chloride: 110 mmol/L (ref 101–111)
Creatinine, Ser: 1.53 mg/dL — ABNORMAL HIGH (ref 0.44–1.00)
GFR calc Af Amer: 38 mL/min — ABNORMAL LOW (ref >60)
GFR calc non Af Amer: 32 mL/min — ABNORMAL LOW (ref >60)
Glucose, Bld: 99 mg/dL (ref 65–99)
Potassium: 4.1 mmol/L (ref 3.5–5.1)
Sodium: 143 mmol/L (ref 135–145)
Total Bilirubin: 0.4 mg/dL (ref 0.3–1.2)
Total Protein: 7.3 g/dL (ref 6.5–8.1)

## 2016-01-25 LAB — CBC
HCT: 36.3 % (ref 36.0–46.0)
Hemoglobin: 12.1 g/dL (ref 12.0–15.0)
MCH: 31.9 pg (ref 26.0–34.0)
MCHC: 33.3 g/dL (ref 30.0–36.0)
MCV: 95.8 fL (ref 78.0–100.0)
Platelets: 216 10*3/uL (ref 150–400)
RBC: 3.79 MIL/uL — ABNORMAL LOW (ref 3.87–5.11)
RDW: 15.8 % — ABNORMAL HIGH (ref 11.5–15.5)
WBC: 7.6 10*3/uL (ref 4.0–10.5)

## 2016-01-25 LAB — SURGICAL PCR SCREEN
MRSA, PCR: NEGATIVE
Staphylococcus aureus: POSITIVE — AB

## 2016-01-25 LAB — PROTIME-INR
INR: 1.09 (ref 0.00–1.49)
Prothrombin Time: 14.3 seconds (ref 11.6–15.2)

## 2016-01-25 LAB — APTT: aPTT: 27 seconds (ref 24–37)

## 2016-01-25 MED ORDER — AMLODIPINE BESYLATE 10 MG PO TABS
10.0000 mg | ORAL_TABLET | Freq: Every day | ORAL | Status: DC
Start: 2016-01-25 — End: 2016-03-30

## 2016-01-25 MED ORDER — PANTOPRAZOLE SODIUM 40 MG PO TBEC: 40.0000 mg | DELAYED_RELEASE_TABLET | Freq: Two times a day (BID) | ORAL | Status: DC

## 2016-01-25 MED ORDER — CHLORHEXIDINE GLUCONATE 4 % EX LIQD: 60.0000 mL | Freq: Once | CUTANEOUS | Status: DC

## 2016-01-25 NOTE — Pre-Procedure Instructions (Signed)
    [name]  01/25/2016      [facility], [place], [place] [id number] [place] [address] Phone: [phone] Fax: [phone]    Your procedure is scheduled on April 5  Report to [facility] Admitting at 10:30 A.M.  Call this number if you have problems the morning of surgery:  [phone]   Remember:  Do not eat food or drink liquids after midnight.  Take these medicines the morning of surgery with A SIP OF WATER : allopurinol (ZYLOPRIM), amLODipine (NORVASC),  buPROPion (WELLBUTRIN XL), pantoprazole (PROTONIX), pain pill if needed   Stop aspirin, herbal medicaitons 3/30   Do not wear jewelry, make-up or nail polish.  Do not wear lotions, powders, or perfumes.  You may wear deodorant.  Do not shave 48 hours prior to surgery.  Men may shave face and neck.  Do not bring valuables to the hospital.  [facility] is not responsible for any belongings or valuables.  Contacts, dentures or bridgework may not be worn into surgery.  Leave your suitcase in the car.  After surgery it may be brought to your room.  For patients admitted to the hospital, discharge time will be determined by your treatment team.  Patients discharged the day of surgery will not be allowed to drive home.   Name and phone number of your driver:    Special instructions:  Preparing for surgery  Please read over the following fact sheets that you were given. Pain Booklet, Coughing and Deep Breathing and Surgical Site Infection Prevention

## 2016-01-25 NOTE — Progress Notes (Signed)
   Subjective:    Patient ID: [name], female    DOB: 01-Nov-1940, 75 y.o.   MRN: [id number]  HPI 75 yo female with chronic D CHF, HTN, OSA, reactive airway disease, GERD, CKD III, hx of TIA, obesity, opoid dependence, paranoid schizophrenia, low back pain 2/2 to Lumbar stenosis, and other medical problems here for medication refill.  Planned to have back surgery (L4-L5 decompression) for her lumbar stenosis scheduled on 02/02/2016.   Patient is doing well. She brought all of her medications and stated she needs refill on all of them. But I checked and she needs only refill for her protonix which she takes for GERD - well controlled on this. Also needs refill on amlodipine.  HTN - she takes amlodipine 10mg  daily + lasix 20mg  daily + losartan 100mg  daily. No problems with this. Compliant with meds. BP    Review of Systems  Constitutional: Negative for fever and chills.  HENT: Negative for congestion and sore throat.   Eyes: Negative for photophobia and visual disturbance.  Respiratory: Negative for chest tightness, shortness of breath and wheezing.   Cardiovascular: Negative for chest pain, palpitations and leg swelling.  Gastrointestinal: Negative for abdominal pain and abdominal distention.  Endocrine: Negative.   Musculoskeletal: Positive for back pain.  Skin: Negative.   Allergic/Immunologic: Negative.   Neurological: Negative for dizziness, weakness and numbness.       Objective:   Physical Exam  Constitutional: She is oriented to person, place, and time. She appears well-developed and well-nourished.  HENT:  Head: Normocephalic and atraumatic.  Mouth/Throat: No oropharyngeal exudate.  Eyes: Pupils are equal, round, and reactive to light.  Neck: Normal range of motion.  Cardiovascular: Normal rate and regular rhythm.  Exam reveals no gallop and no friction rub.   No murmur heard. Pulmonary/Chest: Effort normal and breath sounds normal. No respiratory distress. She has  no wheezes.  Abdominal: Soft. Bowel sounds are normal. She exhibits no distension. There is no tenderness.  Neurological: She is alert and oriented to person, place, and time. No cranial nerve deficit.     Filed Vitals:   01/25/16 1602  BP: 125/56  Pulse: 74  Temp: 98 F (36.7 C)        Assessment & Plan:  See problem based a&p.

## 2016-01-25 NOTE — Patient Instructions (Signed)
Refilled your meds. Follow up as needed. Good luck with your surgery.

## 2016-01-26 NOTE — Assessment & Plan Note (Signed)
GERD well controlled on PPI. Sent refill for protonix 40 mg BID.

## 2016-01-26 NOTE — Assessment & Plan Note (Signed)
Filed Vitals:   01/25/16 1602  BP: 125/56  Pulse: 74  Temp: 98 F (36.7 C)   BP well controlled. On amlodipine 10mg  + losartan 100mg  daily + lasix 20mg  daily. Continue same regimen.  - refilled amlodipine.

## 2016-01-27 ENCOUNTER — Other Ambulatory Visit: Payer: Self-pay | Admitting: Internal Medicine

## 2016-01-28 NOTE — Telephone Encounter (Signed)
Last appt 3/28 w. Dr [name]. No f/u appt scheduled.

## 2016-01-28 NOTE — Telephone Encounter (Signed)
Tramadol rx called [place].

## 2016-01-28 NOTE — Telephone Encounter (Signed)
Pls phone in tramadol. I am not providing RF bc to get surgery in April

## 2016-01-31 NOTE — Progress Notes (Signed)
Internal Medicine Clinic Attending  Case discussed with Dr. Ahmed at the time of the visit.  We reviewed the resident's history and exam and pertinent patient test results.  I agree with the assessment, diagnosis, and plan of care documented in the resident's note. 

## 2016-02-01 MED ORDER — CEFAZOLIN SODIUM-DEXTROSE 2-4 GM/100ML-% IV SOLN
2.0000 g | INTRAVENOUS | Status: AC
  Administered 2016-02-02: 2 g via INTRAVENOUS
  Filled 2016-02-01: qty 100

## 2016-02-01 NOTE — H&P (Signed)
[name] is an 75 y.o. female.   Patient returns with continued problems with low back pain, some neck pain, and bilateral hip pain with lower extremity weakness.  She has had previous 4-5  ACDF by Dr. Lorin Mercy in 2007.  Known history of lumbar spondylosis.  She states the pain has gotten worse.  She has numbness when she stands, numbness when she walks, states she feels weak.  When she walks, she can only stand for a few minutes and then has to stop, sit, and rest.  With standing when she starts to cook a meal, she has to stop, sit down, weight 4 or 5 minutes, and then repeat.  She uses a motorized grocery cart when she goes to the store.  She denies any falling but states her legs feel weak, and if she does not sit down, she is worried that she will fall.   MEDICATIONS/ALLERGIES/PAST MEDICAL AND SURGICAL HISTORY/SOCIAL HISTORY/REVIEW OF SYSTEMS:  No change from 11/23/2015 note.    IMAGING:  Previous x-rays showed fusion intact at C3 to C5.  Disk degeneration at C5-6 with collapse and spurring. Collapse of 5-1 and 3-4 anterolisthesis at L4-5.   Cervical MRI on 01/03/2016 is available for review. This shows some worsening degeneration of C1-2 without compression.  Facet fusion on the right at C2-3, solid C3 to C5 fusion.  She has a little bit of gliosis of the cord at C3-4 as previously noted preoperatively.  Prime degenerative changes C5-6 without cord compression and chronic disk bulge at C6-7, left worse than right foraminal narrowing at C6-7.   Past Medical History  Diagnosis Date  . History of cervical cancer 1972  . Ovarian cancer ([place]) 1972    S/P oophorectomy  . GERD (gastroesophageal reflux disease)   . Gout   . Hypertension   . DJD (degenerative joint disease), multiple sites     Low back pain worst  . DDD (degenerative disc disease), lumbosacral   . Osteoarthritis of hip     bilateral hips  . Hx-TIA (transient ischemic attack) 05/13/2001    Right facial numbness  .  Sensorineural hearing loss of both ears   . Mitral valve prolapse 10/27/1999    Subsequent 2D echo show normal mitral valve  . Chronic venous insufficiency   . Colon, diverticulosis Sept. 2011    outpatient colonoscopy by Dr. Cristina Gong.  Need record  . Hyperlipidemia   . Recurrent boils     History of MRSA skin infections with abscess  . Gout     Uric Acid level 4.2 on 300 allopurinol  . Depression   . Psychosis     Past Surgical History  Procedure Laterality Date  . Joint replacement      bilateral hip replacement  . Abdominal hysterectomy      1972  . Oophorectomy      1972 for ovarian cancer  . Lumbar disc surgery      L5-S1 7/07  . Cervical discectomy  7/07    C5-C6    Family History  Problem Relation Age of Onset  . Diabetes Mother   . Heart disease Mother   . Hearing loss Mother   . Stroke Mother   . Schizophrenia Maternal Grandmother   . Diabetes Maternal Grandfather   . Other Brother     brother died in mental health hospital  . Post-traumatic stress disorder Father    Social History:  reports that she quit smoking about 2 years ago. Her smoking use  included Cigarettes. She smoked 0.10 packs per day. She has never used smokeless tobacco. She reports that she does not drink alcohol or use illicit drugs.  Allergies:  Allergies  Allergen Reactions  . Aspirin Other (See Comments)    bleeding  . Sulfamethoxazole-Trimethoprim Itching    No prescriptions prior to admission    No results found for this or any previous visit (from the past 48 hour(s)). No results found.  Review of Systems  HENT: Negative.   Respiratory: Negative.   Cardiovascular: Negative.   Gastrointestinal: Negative.   Genitourinary: Negative.   Musculoskeletal: Positive for back pain.  Skin: Negative.   Neurological: Negative.   Psychiatric/Behavioral: Negative.     There were no vitals taken for this visit. Physical Exam  Constitutional: She is oriented to person, place, and  time. No distress.  HENT:  Head: Atraumatic.  Eyes: EOM are normal.  Neck: Normal range of motion.  Cardiovascular: Normal rate.   Respiratory: Effort normal. No respiratory distress.  GI: She exhibits distension.  Musculoskeletal: She exhibits tenderness.  Neurological: She is alert and oriented to person, place, and time.  Skin: Skin is warm and dry.  Psychiatric: She has a normal mood and affect.     EXAM: MRI LUMBAR SPINE WITHOUT CONTRAST  TECHNIQUE: Multiplanar, multisequence MR imaging of the lumbar spine was performed. No intravenous contrast was administered.  COMPARISON: MRI lumbar spine 03/20/2008.  FINDINGS: Stable alignment of lumbar vertebral bodies. They demonstrate normal marrow signal except for mild endplate reactive changes. No acute fracture or worrisome bone lesion. Advanced bilateral facet disease but no definite pars defects. The conus medullaris terminates at L1. No significant paraspinal or retroperitoneal findings. Mild diffuse fatty infiltration of the paraspinal muscles.  L1-2: Mild annular bulge and moderate facet disease but no focal disc protrusion, spinal or foraminal stenosis.  L2-3: Diffuse bulging annulus, osteophytic ridging and moderate facet disease with ligamentum flavum thickening contributing to mild spinal and bilateral lateral recess stenosis and mild bilateral foraminal stenosis. These findings are slightly progressive.  L3-4: Diffuse annular bulge, osteophytic ridging and facet disease contributing to early spinal stenosis and mild bilateral lateral recess stenosis. No significant foraminal stenosis.  L4-5: Broad-based bulging degenerated annulus, osteophytic ridging, short pedicles and advanced facet disease contributing to moderate spinal and bilateral lateral recess stenosis and mild right greater than left foraminal stenosis. These findings are somewhat progressive.  L5-S1: Progressive degenerative disc disease  and facet disease. There is a small amount of fluid in the disc space but I do not see any findings worrisome for discitis or osteomyelitis. This is probably just disc liquefaction. Moderate osteophytic ridging is again demonstrated along with advanced facet disease. No significant spinal or foraminal stenosis.  IMPRESSION: 1. Advanced degenerative lumbar spondylosis with multilevel disc disease and facet disease. Progressive finding since 2009 as described above. No acute bony findings or worrisome bone lesions. 2. The most significant level is L4-5 where there is moderate spinal and bilateral lateral recess stenosis and mild right greater than left foraminal stenosis.    ASSESSMENT/PLAN:  Spinal stenosis L4-5 which has progressed, now with neurogenic claudication.  We discussed options to help with her neurogenic claudication.  She has had epidurals in the past but understands that these will not be effective in treating her spinal stenosis at L4-5.  Surgical options were discussed. She can consider this and call if she would like to proceed. This would be overnight stay, use of the operative microscope, and decompression at the L4-5  level where she has stenosis.  I discussed with her I do not think requires a fusion procedure.  She understands she still would have some arthritis in her lower back, but her neurogenic claudication symptoms should improve.   Lanae Crumbly, PA-C 02/01/2016, 3:56 PM

## 2016-02-02 ENCOUNTER — Inpatient Hospital Stay (HOSPITAL_COMMUNITY): Payer: PPO

## 2016-02-02 ENCOUNTER — Encounter (HOSPITAL_COMMUNITY)
Admission: RE | Disposition: A | Discharge status: Home / Self Care | Payer: Self-pay | Source: Ambulatory Visit | Attending: Orthopaedic Surgery

## 2016-02-02 ENCOUNTER — Inpatient Hospital Stay (HOSPITAL_COMMUNITY): Payer: PPO | Admitting: Anesthesiology

## 2016-02-02 ENCOUNTER — Observation Stay (HOSPITAL_COMMUNITY)
Admission: RE | Admit: 2016-02-02 | Discharge: 2016-02-03 | Disposition: A | Discharge status: Home / Self Care | Payer: PPO | Source: Ambulatory Visit | Attending: Orthopaedic Surgery | Admitting: Orthopaedic Surgery

## 2016-02-02 ENCOUNTER — Encounter (HOSPITAL_COMMUNITY): Payer: Self-pay | Admitting: *Deleted

## 2016-02-02 DIAGNOSIS — K5730 Diverticulosis of large intestine without perforation or abscess without bleeding: Secondary | ICD-10-CM | POA: Insufficient documentation

## 2016-02-02 DIAGNOSIS — Z87891 Personal history of nicotine dependence: Secondary | ICD-10-CM | POA: Diagnosis not present

## 2016-02-02 DIAGNOSIS — Z820 Family history of epilepsy and other diseases of the nervous system: Secondary | ICD-10-CM | POA: Insufficient documentation

## 2016-02-02 DIAGNOSIS — I341 Nonrheumatic mitral (valve) prolapse: Secondary | ICD-10-CM | POA: Insufficient documentation

## 2016-02-02 DIAGNOSIS — H905 Unspecified sensorineural hearing loss: Secondary | ICD-10-CM | POA: Insufficient documentation

## 2016-02-02 DIAGNOSIS — Z6838 Body mass index (BMI) 38.0-38.9, adult: Secondary | ICD-10-CM | POA: Diagnosis not present

## 2016-02-02 DIAGNOSIS — M47896 Other spondylosis, lumbar region: Secondary | ICD-10-CM | POA: Insufficient documentation

## 2016-02-02 DIAGNOSIS — Z818 Family history of other mental and behavioral disorders: Secondary | ICD-10-CM | POA: Diagnosis not present

## 2016-02-02 DIAGNOSIS — M1990 Unspecified osteoarthritis, unspecified site: Secondary | ICD-10-CM | POA: Diagnosis not present

## 2016-02-02 DIAGNOSIS — I872 Venous insufficiency (chronic) (peripheral): Secondary | ICD-10-CM | POA: Diagnosis not present

## 2016-02-02 DIAGNOSIS — M545 Low back pain: Secondary | ICD-10-CM | POA: Diagnosis not present

## 2016-02-02 DIAGNOSIS — M160 Bilateral primary osteoarthritis of hip: Secondary | ICD-10-CM | POA: Diagnosis not present

## 2016-02-02 DIAGNOSIS — I10 Essential (primary) hypertension: Secondary | ICD-10-CM | POA: Diagnosis not present

## 2016-02-02 DIAGNOSIS — M109 Gout, unspecified: Secondary | ICD-10-CM | POA: Diagnosis not present

## 2016-02-02 DIAGNOSIS — G4730 Sleep apnea, unspecified: Secondary | ICD-10-CM | POA: Diagnosis not present

## 2016-02-02 DIAGNOSIS — Z886 Allergy status to analgesic agent status: Secondary | ICD-10-CM | POA: Insufficient documentation

## 2016-02-02 DIAGNOSIS — Z8673 Personal history of transient ischemic attack (TIA), and cerebral infarction without residual deficits: Secondary | ICD-10-CM | POA: Insufficient documentation

## 2016-02-02 DIAGNOSIS — M4806 Spinal stenosis, lumbar region: Secondary | ICD-10-CM | POA: Diagnosis not present

## 2016-02-02 DIAGNOSIS — Z823 Family history of stroke: Secondary | ICD-10-CM | POA: Insufficient documentation

## 2016-02-02 DIAGNOSIS — M25551 Pain in right hip: Secondary | ICD-10-CM | POA: Insufficient documentation

## 2016-02-02 DIAGNOSIS — Z8543 Personal history of malignant neoplasm of ovary: Secondary | ICD-10-CM | POA: Diagnosis not present

## 2016-02-02 DIAGNOSIS — Z981 Arthrodesis status: Secondary | ICD-10-CM | POA: Diagnosis not present

## 2016-02-02 DIAGNOSIS — Z8541 Personal history of malignant neoplasm of cervix uteri: Secondary | ICD-10-CM | POA: Diagnosis not present

## 2016-02-02 DIAGNOSIS — M5137 Other intervertebral disc degeneration, lumbosacral region: Secondary | ICD-10-CM | POA: Diagnosis not present

## 2016-02-02 DIAGNOSIS — I739 Peripheral vascular disease, unspecified: Secondary | ICD-10-CM | POA: Insufficient documentation

## 2016-02-02 DIAGNOSIS — M25552 Pain in left hip: Secondary | ICD-10-CM | POA: Diagnosis not present

## 2016-02-02 DIAGNOSIS — Z882 Allergy status to sulfonamides status: Secondary | ICD-10-CM | POA: Diagnosis not present

## 2016-02-02 DIAGNOSIS — Z96643 Presence of artificial hip joint, bilateral: Secondary | ICD-10-CM | POA: Insufficient documentation

## 2016-02-02 DIAGNOSIS — F329 Major depressive disorder, single episode, unspecified: Secondary | ICD-10-CM | POA: Insufficient documentation

## 2016-02-02 DIAGNOSIS — M542 Cervicalgia: Secondary | ICD-10-CM | POA: Diagnosis not present

## 2016-02-02 DIAGNOSIS — E785 Hyperlipidemia, unspecified: Secondary | ICD-10-CM | POA: Diagnosis not present

## 2016-02-02 DIAGNOSIS — Z833 Family history of diabetes mellitus: Secondary | ICD-10-CM | POA: Insufficient documentation

## 2016-02-02 DIAGNOSIS — Z8249 Family history of ischemic heart disease and other diseases of the circulatory system: Secondary | ICD-10-CM | POA: Insufficient documentation

## 2016-02-02 DIAGNOSIS — Z419 Encounter for procedure for purposes other than remedying health state, unspecified: Secondary | ICD-10-CM

## 2016-02-02 DIAGNOSIS — E669 Obesity, unspecified: Secondary | ICD-10-CM | POA: Insufficient documentation

## 2016-02-02 DIAGNOSIS — F29 Unspecified psychosis not due to a substance or known physiological condition: Secondary | ICD-10-CM | POA: Diagnosis not present

## 2016-02-02 DIAGNOSIS — Z90710 Acquired absence of both cervix and uterus: Secondary | ICD-10-CM | POA: Insufficient documentation

## 2016-02-02 DIAGNOSIS — Z9889 Other specified postprocedural states: Secondary | ICD-10-CM

## 2016-02-02 DIAGNOSIS — K219 Gastro-esophageal reflux disease without esophagitis: Secondary | ICD-10-CM | POA: Insufficient documentation

## 2016-02-02 DIAGNOSIS — M961 Postlaminectomy syndrome, not elsewhere classified: Secondary | ICD-10-CM | POA: Diagnosis not present

## 2016-02-02 HISTORY — PX: LUMBAR LAMINECTOMY/DECOMPRESSION MICRODISCECTOMY: SHX5026

## 2016-02-02 SURGERY — LUMBAR LAMINECTOMY/DECOMPRESSION MICRODISCECTOMY
Anesthesia: General | Site: Spine Lumbar

## 2016-02-02 MED ORDER — PROPOFOL 10 MG/ML IV BOLUS
INTRAVENOUS | Status: AC
  Filled 2016-02-02: qty 20

## 2016-02-02 MED ORDER — ONDANSETRON HCL 4 MG/2ML IJ SOLN
INTRAMUSCULAR | Status: AC
Start: 2016-02-02 — End: 2016-02-02
  Filled 2016-02-02: qty 2

## 2016-02-02 MED ORDER — CEFAZOLIN SODIUM 1-5 GM-% IV SOLN
1.0000 g | Freq: Three times a day (TID) | INTRAVENOUS | Status: DC
  Filled 2016-02-02 (×2): qty 50

## 2016-02-02 MED ORDER — DOCUSATE SODIUM 100 MG PO CAPS
100.0000 mg | ORAL_CAPSULE | Freq: Two times a day (BID) | ORAL | Status: DC
  Administered 2016-02-02 – 2016-02-03 (×2): 100 mg via ORAL
  Filled 2016-02-02 (×2): qty 1

## 2016-02-02 MED ORDER — LIDOCAINE HCL (CARDIAC) 20 MG/ML IV SOLN
INTRAVENOUS | Status: DC | PRN
  Administered 2016-02-02: 60 mg via INTRAVENOUS

## 2016-02-02 MED ORDER — MEPERIDINE HCL 25 MG/ML IJ SOLN: 6.2500 mg | INTRAMUSCULAR | Status: DC | PRN

## 2016-02-02 MED ORDER — PHENYLEPHRINE 40 MCG/ML (10ML) SYRINGE FOR IV PUSH (FOR BLOOD PRESSURE SUPPORT)
PREFILLED_SYRINGE | INTRAVENOUS | Status: AC
  Filled 2016-02-02: qty 10

## 2016-02-02 MED ORDER — ACETAMINOPHEN 325 MG PO TABS: 650.0000 mg | ORAL_TABLET | ORAL | Status: DC | PRN

## 2016-02-02 MED ORDER — ACETAMINOPHEN 650 MG RE SUPP
650.0000 mg | RECTAL | Status: DC | PRN
Start: 2016-02-02 — End: 2016-02-03

## 2016-02-02 MED ORDER — PROPOFOL 10 MG/ML IV BOLUS
INTRAVENOUS | Status: DC | PRN
  Administered 2016-02-02: 140 mg via INTRAVENOUS
  Administered 2016-02-02 (×2): 30 mg via INTRAVENOUS

## 2016-02-02 MED ORDER — BUPIVACAINE HCL (PF) 0.25 % IJ SOLN
INTRAMUSCULAR | Status: AC
  Filled 2016-02-02: qty 30

## 2016-02-02 MED ORDER — POLYETHYLENE GLYCOL 3350 17 G PO PACK: 17.0000 g | PACK | Freq: Every day | ORAL | Status: DC | PRN

## 2016-02-02 MED ORDER — SENNA 8.6 MG PO TABS
1.0000 | ORAL_TABLET | ORAL | Status: DC
  Administered 2016-02-02: 8.6 mg via ORAL
  Filled 2016-02-02: qty 1

## 2016-02-02 MED ORDER — OXYBUTYNIN CHLORIDE ER 5 MG PO TB24
5.0000 mg | ORAL_TABLET | Freq: Every day | ORAL | Status: DC
  Administered 2016-02-02: 5 mg via ORAL
  Filled 2016-02-02 (×2): qty 1

## 2016-02-02 MED ORDER — FENTANYL CITRATE (PF) 100 MCG/2ML IJ SOLN
25.0000 ug | INTRAMUSCULAR | Status: DC | PRN
  Administered 2016-02-02 (×3): 50 ug via INTRAVENOUS

## 2016-02-02 MED ORDER — BUPROPION HCL ER (XL) 150 MG PO TB24
300.0000 mg | ORAL_TABLET | Freq: Every morning | ORAL | Status: DC
  Administered 2016-02-03: 300 mg via ORAL
  Filled 2016-02-02: qty 2

## 2016-02-02 MED ORDER — SUGAMMADEX SODIUM 200 MG/2ML IV SOLN
INTRAVENOUS | Status: DC | PRN
  Administered 2016-02-02: 200 mg via INTRAVENOUS

## 2016-02-02 MED ORDER — HYDROMORPHONE HCL 1 MG/ML IJ SOLN: 0.5000 mg | INTRAMUSCULAR | Status: DC | PRN

## 2016-02-02 MED ORDER — ONDANSETRON HCL 4 MG/2ML IJ SOLN
INTRAMUSCULAR | Status: DC | PRN
  Administered 2016-02-02: 4 mg via INTRAVENOUS

## 2016-02-02 MED ORDER — FENTANYL CITRATE (PF) 100 MCG/2ML IJ SOLN
INTRAMUSCULAR | Status: AC
  Administered 2016-02-02: 50 ug via INTRAVENOUS
  Filled 2016-02-02: qty 2

## 2016-02-02 MED ORDER — PHENYLEPHRINE HCL 10 MG/ML IJ SOLN
10.0000 mg | INTRAVENOUS | Status: DC | PRN
  Administered 2016-02-02: 20 ug/min via INTRAVENOUS

## 2016-02-02 MED ORDER — HALOPERIDOL 1 MG PO TABS
1.0000 mg | ORAL_TABLET | Freq: Every day | ORAL | Status: DC
  Administered 2016-02-02: 1 mg via ORAL
  Filled 2016-02-02 (×2): qty 1

## 2016-02-02 MED ORDER — SODIUM CHLORIDE 0.9% FLUSH: 3.0000 mL | Freq: Two times a day (BID) | INTRAVENOUS | Status: DC

## 2016-02-02 MED ORDER — ROCURONIUM BROMIDE 50 MG/5ML IV SOLN
INTRAVENOUS | Status: AC
  Filled 2016-02-02: qty 1

## 2016-02-02 MED ORDER — FENTANYL CITRATE (PF) 100 MCG/2ML IJ SOLN
INTRAMUSCULAR | Status: AC
  Filled 2016-02-02: qty 2

## 2016-02-02 MED ORDER — PHENYLEPHRINE HCL 10 MG/ML IJ SOLN
INTRAMUSCULAR | Status: AC
  Filled 2016-02-02: qty 1

## 2016-02-02 MED ORDER — AMLODIPINE BESYLATE 10 MG PO TABS
10.0000 mg | ORAL_TABLET | Freq: Every day | ORAL | Status: DC
  Administered 2016-02-03: 10 mg via ORAL
  Filled 2016-02-02: qty 1

## 2016-02-02 MED ORDER — ALLOPURINOL 300 MG PO TABS
300.0000 mg | ORAL_TABLET | Freq: Every day | ORAL | Status: DC
  Administered 2016-02-03: 300 mg via ORAL
  Filled 2016-02-02: qty 1

## 2016-02-02 MED ORDER — SUGAMMADEX SODIUM 200 MG/2ML IV SOLN
INTRAVENOUS | Status: AC
  Filled 2016-02-02: qty 2

## 2016-02-02 MED ORDER — MIDAZOLAM HCL 2 MG/2ML IJ SOLN
INTRAMUSCULAR | Status: AC
  Filled 2016-02-02: qty 2

## 2016-02-02 MED ORDER — DIPHENHYDRAMINE HCL 50 MG/ML IJ SOLN
INTRAMUSCULAR | Status: AC
  Filled 2016-02-02: qty 1

## 2016-02-02 MED ORDER — ALBUTEROL SULFATE (2.5 MG/3ML) 0.083% IN NEBU: 3.0000 mL | INHALATION_SOLUTION | Freq: Four times a day (QID) | RESPIRATORY_TRACT | Status: DC | PRN

## 2016-02-02 MED ORDER — ONDANSETRON HCL 4 MG/2ML IJ SOLN
INTRAMUSCULAR | Status: AC
  Filled 2016-02-02: qty 2

## 2016-02-02 MED ORDER — ROCURONIUM BROMIDE 100 MG/10ML IV SOLN
INTRAVENOUS | Status: DC | PRN
  Administered 2016-02-02: 50 mg via INTRAVENOUS

## 2016-02-02 MED ORDER — SODIUM CHLORIDE 0.9% FLUSH: 3.0000 mL | INTRAVENOUS | Status: DC | PRN

## 2016-02-02 MED ORDER — FENTANYL CITRATE (PF) 250 MCG/5ML IJ SOLN
INTRAMUSCULAR | Status: AC
  Filled 2016-02-02: qty 5

## 2016-02-02 MED ORDER — FLUTICASONE PROPIONATE 50 MCG/ACT NA SUSP
1.0000 | Freq: Every day | NASAL | Status: DC
  Administered 2016-02-03: 1 via NASAL
  Filled 2016-02-02 (×2): qty 16

## 2016-02-02 MED ORDER — MIDAZOLAM HCL 5 MG/5ML IJ SOLN
INTRAMUSCULAR | Status: DC | PRN
  Administered 2016-02-02: 1 mg via INTRAVENOUS
  Administered 2016-02-02: 2 mg via INTRAVENOUS
  Administered 2016-02-02: 1 mg via INTRAVENOUS

## 2016-02-02 MED ORDER — SODIUM CHLORIDE 0.45 % IV SOLN: INTRAVENOUS | Status: DC

## 2016-02-02 MED ORDER — PHENYLEPHRINE HCL 10 MG/ML IJ SOLN
INTRAMUSCULAR | Status: DC | PRN
  Administered 2016-02-02: 120 ug via INTRAVENOUS
  Administered 2016-02-02: 80 ug via INTRAVENOUS
  Administered 2016-02-02: 120 ug via INTRAVENOUS
  Administered 2016-02-02: 80 ug via INTRAVENOUS

## 2016-02-02 MED ORDER — PHENOL 1.4 % MT LIQD: 1.0000 | OROMUCOSAL | Status: DC | PRN

## 2016-02-02 MED ORDER — FUROSEMIDE 20 MG PO TABS
20.0000 mg | ORAL_TABLET | Freq: Every day | ORAL | Status: DC
  Administered 2016-02-03: 20 mg via ORAL
  Filled 2016-02-02 (×2): qty 1

## 2016-02-02 MED ORDER — HYDROCODONE-ACETAMINOPHEN 5-325 MG PO TABS
1.0000 | ORAL_TABLET | Freq: Four times a day (QID) | ORAL | Status: DC | PRN
  Administered 2016-02-02 – 2016-02-03 (×3): 2 via ORAL
  Filled 2016-02-02 (×3): qty 2

## 2016-02-02 MED ORDER — LACTATED RINGERS IV SOLN
INTRAVENOUS | Status: DC
  Administered 2016-02-02 (×2): via INTRAVENOUS

## 2016-02-02 MED ORDER — SODIUM CHLORIDE 0.9 % IV SOLN: 250.0000 mL | INTRAVENOUS | Status: DC

## 2016-02-02 MED ORDER — LIDOCAINE HCL (CARDIAC) 20 MG/ML IV SOLN
INTRAVENOUS | Status: AC
  Filled 2016-02-02: qty 5

## 2016-02-02 MED ORDER — EPHEDRINE SULFATE 50 MG/ML IJ SOLN
INTRAMUSCULAR | Status: DC | PRN
  Administered 2016-02-02: 15 mg via INTRAVENOUS
  Administered 2016-02-02: 10 mg via INTRAVENOUS
  Administered 2016-02-02: 15 mg via INTRAVENOUS
  Administered 2016-02-02: 10 mg via INTRAVENOUS

## 2016-02-02 MED ORDER — HEMOSTATIC AGENTS (NO CHARGE) OPTIME
TOPICAL | Status: DC | PRN
  Administered 2016-02-02: 1 via TOPICAL

## 2016-02-02 MED ORDER — CEFAZOLIN SODIUM 1-5 GM-% IV SOLN
1.0000 g | Freq: Three times a day (TID) | INTRAVENOUS | Status: DC
  Filled 2016-02-02: qty 50

## 2016-02-02 MED ORDER — POTASSIUM CHLORIDE ER 10 MEQ PO TBCR
10.0000 meq | EXTENDED_RELEASE_TABLET | Freq: Every day | ORAL | Status: DC
  Administered 2016-02-02 – 2016-02-03 (×2): 10 meq via ORAL
  Filled 2016-02-02 (×4): qty 1

## 2016-02-02 MED ORDER — ATORVASTATIN CALCIUM 40 MG PO TABS
40.0000 mg | ORAL_TABLET | Freq: Every day | ORAL | Status: DC
  Administered 2016-02-03: 40 mg via ORAL
  Filled 2016-02-02: qty 1

## 2016-02-02 MED ORDER — FENTANYL CITRATE (PF) 100 MCG/2ML IJ SOLN
INTRAMUSCULAR | Status: DC | PRN
  Administered 2016-02-02 (×5): 50 ug via INTRAVENOUS

## 2016-02-02 MED ORDER — 0.9 % SODIUM CHLORIDE (POUR BTL) OPTIME
TOPICAL | Status: DC | PRN
  Administered 2016-02-02: 1000 mL

## 2016-02-02 MED ORDER — ONDANSETRON HCL 4 MG/2ML IJ SOLN: 4.0000 mg | INTRAMUSCULAR | Status: DC | PRN

## 2016-02-02 MED ORDER — DIPHENHYDRAMINE HCL 50 MG/ML IJ SOLN
INTRAMUSCULAR | Status: DC | PRN
  Administered 2016-02-02: 12.5 mg via INTRAVENOUS

## 2016-02-02 MED ORDER — LOSARTAN POTASSIUM 50 MG PO TABS
100.0000 mg | ORAL_TABLET | Freq: Every day | ORAL | Status: DC
  Administered 2016-02-02 – 2016-02-03 (×2): 100 mg via ORAL
  Filled 2016-02-02 (×2): qty 2

## 2016-02-02 MED ORDER — MENTHOL 3 MG MT LOZG: 1.0000 | LOZENGE | OROMUCOSAL | Status: DC | PRN

## 2016-02-02 MED ORDER — PANTOPRAZOLE SODIUM 40 MG PO TBEC
40.0000 mg | DELAYED_RELEASE_TABLET | Freq: Two times a day (BID) | ORAL | Status: DC
  Administered 2016-02-03: 40 mg via ORAL
  Filled 2016-02-02: qty 1

## 2016-02-02 SURGICAL SUPPLY — 41 items
BUR ROUND FLUTED 4 SOFT TCH (BURR) ×2 IMPLANT
CANISTER SUCTION WELLS/JOHNSON (MISCELLANEOUS) ×2 IMPLANT
CLSR STERI-STRIP ANTIMIC 1/2X4 (GAUZE/BANDAGES/DRESSINGS) ×2 IMPLANT
COVER SURGICAL LIGHT HANDLE (MISCELLANEOUS) ×2 IMPLANT
DERMABOND ADVANCED (GAUZE/BANDAGES/DRESSINGS) ×1
DERMABOND ADVANCED .7 DNX12 (GAUZE/BANDAGES/DRESSINGS) ×1 IMPLANT
DRAPE MICROSCOPE LEICA (MISCELLANEOUS) ×2 IMPLANT
DRAPE PROXIMA HALF (DRAPES) ×4 IMPLANT
DRSG MEPILEX BORDER 4X4 (GAUZE/BANDAGES/DRESSINGS) ×2 IMPLANT
DRSG MEPILEX BORDER 4X8 (GAUZE/BANDAGES/DRESSINGS) IMPLANT
DURAPREP 26ML APPLICATOR (WOUND CARE) ×2 IMPLANT
ELECT BLADE 4.0 EZ CLEAN MEGAD (MISCELLANEOUS) ×2
ELECT REM PT RETURN 9FT ADLT (ELECTROSURGICAL) ×2
ELECTRODE BLDE 4.0 EZ CLN MEGD (MISCELLANEOUS) ×1 IMPLANT
ELECTRODE REM PT RTRN 9FT ADLT (ELECTROSURGICAL) ×1 IMPLANT
GLOVE BIOGEL PI IND STRL 8 (GLOVE) ×2 IMPLANT
GLOVE BIOGEL PI INDICATOR 8 (GLOVE) ×2
GLOVE ORTHO TXT STRL SZ7.5 (GLOVE) ×4 IMPLANT
GOWN STRL REUS W/ TWL LRG LVL3 (GOWN DISPOSABLE) ×1 IMPLANT
GOWN STRL REUS W/ TWL XL LVL3 (GOWN DISPOSABLE) ×1 IMPLANT
GOWN STRL REUS W/TWL 2XL LVL3 (GOWN DISPOSABLE) ×2 IMPLANT
GOWN STRL REUS W/TWL LRG LVL3 (GOWN DISPOSABLE) ×1
GOWN STRL REUS W/TWL XL LVL3 (GOWN DISPOSABLE) ×1
KIT BASIN OR (CUSTOM PROCEDURE TRAY) ×2 IMPLANT
KIT ROOM TURNOVER OR (KITS) ×2 IMPLANT
MANIFOLD NEPTUNE II (INSTRUMENTS) IMPLANT
NEEDLE HYPO 25GX1X1/2 BEV (NEEDLE) ×2 IMPLANT
NEEDLE SPNL 18GX3.5 QUINCKE PK (NEEDLE) ×2 IMPLANT
NS IRRIG 1000ML POUR BTL (IV SOLUTION) ×2 IMPLANT
PACK LAMINECTOMY ORTHO (CUSTOM PROCEDURE TRAY) ×2 IMPLANT
PAD ARMBOARD 7.5X6 YLW CONV (MISCELLANEOUS) ×4 IMPLANT
PATTIES SURGICAL .5 X.5 (GAUZE/BANDAGES/DRESSINGS) IMPLANT
PATTIES SURGICAL .75X.75 (GAUZE/BANDAGES/DRESSINGS) ×2 IMPLANT
SUT BONE WAX W31G (SUTURE) IMPLANT
SUT VIC AB 0 CT1 27 (SUTURE) ×1
SUT VIC AB 0 CT1 27XBRD ANBCTR (SUTURE) ×1 IMPLANT
SUT VIC AB 2-0 CT1 27 (SUTURE) ×1
SUT VIC AB 2-0 CT1 TAPERPNT 27 (SUTURE) ×1 IMPLANT
SUT VIC AB 3-0 X1 27 (SUTURE) IMPLANT
TOWEL OR 17X24 6PK STRL BLUE (TOWEL DISPOSABLE) ×2 IMPLANT
TOWEL OR 17X26 10 PK STRL BLUE (TOWEL DISPOSABLE) ×2 IMPLANT

## 2016-02-02 NOTE — Interval H&P Note (Signed)
History and Physical Interval Note:  02/02/2016 12:12 PM  [name]  has presented today for surgery, with the diagnosis of L4-5 Stenosis  The various methods of treatment have been discussed with the patient and family. After consideration of risks, benefits and other options for treatment, the patient has consented to  Procedure(s): L4-5 Decompression (N/A) as a surgical intervention .  The patient's history has been reviewed, patient examined, no change in status, stable for surgery.  I have reviewed the patient's chart and labs.  Questions were answered to the patient's satisfaction.     [name]

## 2016-02-02 NOTE — Brief Op Note (Signed)
02/02/2016  2:27 PM  PATIENT:  [name]  75 y.o. female  PRE-OPERATIVE DIAGNOSIS:  L4-5 Stenosis  POST-OPERATIVE DIAGNOSIS:  L4-5 Stenosis  PROCEDURE:  Procedure(s): L4-5 Decompression (N/A)  SURGEON:  Surgeon(s) and Role:    * Marybelle Killings, MD - Primary  PHYSICIAN ASSISTANT: Benjiman Core pa-c    ANESTHESIA:   general  EBL:  Total I/O In: 1300 [I.V.:1300] Out: 20 [Blood:20]  BLOOD ADMINISTERED:none  DRAINS: none   LOCAL MEDICATIONS USED:  NONE  SPECIMEN:  No Specimen  DISPOSITION OF SPECIMEN:  N/A  COUNTS:  YES  TOURNIQUET:  * No tourniquets in log *  DICTATION: .Dragon Dictation  PLAN OF CARE: Admit for overnight observation  PATIENT DISPOSITION:  PACU - hemodynamically stable.

## 2016-02-02 NOTE — Anesthesia Procedure Notes (Signed)
Procedure Name: Intubation Date/Time: 02/02/2016 12:34 PM Performed by: Layla Maw Pre-anesthesia Checklist: Patient identified, Patient being monitored, Timeout performed, Emergency Drugs available and Suction available Patient Re-evaluated:Patient Re-evaluated prior to inductionOxygen Delivery Method: Circle System Utilized Preoxygenation: Pre-oxygenation with 100% oxygen Intubation Type: IV induction Ventilation: Mask ventilation without difficulty and Oral airway inserted - appropriate to patient size Laryngoscope Size: Miller and 3 Grade View: Grade I Tube type: Oral Tube size: 7.0 mm Number of attempts: 1 Airway Equipment and Method: Stylet Placement Confirmation: ETT inserted through vocal cords under direct vision,  positive ETCO2 and breath sounds checked- equal and bilateral Secured at: 22 cm Tube secured with: Tape Dental Injury: Teeth and Oropharynx as per pre-operative assessment  Comments: Extreme sniffing position. No manipulation of head/neck during DL and intubation.

## 2016-02-02 NOTE — Transfer of Care (Signed)
Immediate Anesthesia Transfer of Care Note  Patient: [name]  Procedure(s) Performed: Procedure(s): L4-5 Decompression (N/A)  Patient Location: PACU  Anesthesia Type:General  Level of Consciousness: awake, alert  and patient cooperative  Airway & Oxygen Therapy: Patient Spontanous Breathing and Patient connected to nasal cannula oxygen  Post-op Assessment: Report given to RN and Post -op Vital signs reviewed and stable  Post vital signs: Reviewed and stable  Last Vitals:  Filed Vitals:   02/02/16 1053  BP: 147/92  Pulse: 73  Temp: 36.8 C  Resp: 20    Complications: No apparent anesthesia complications

## 2016-02-02 NOTE — Anesthesia Postprocedure Evaluation (Signed)
Anesthesia Post Note  Patient: [name]  Procedure(s) Performed: Procedure(s) (LRB): L4-5 Decompression (N/A)  Patient location during evaluation: PACU Anesthesia Type: General Level of consciousness: awake and alert Pain management: pain level controlled Vital Signs Assessment: post-procedure vital signs reviewed and stable Respiratory status: spontaneous breathing, nonlabored ventilation and respiratory function stable Cardiovascular status: blood pressure returned to baseline and stable Postop Assessment: no signs of nausea or vomiting Anesthetic complications: no    Last Vitals:  Filed Vitals:   02/02/16 1807 02/02/16 1955  BP: 99/51 109/52  Pulse: 73 74  Temp: 36.6 C 36.4 C  Resp: 16 20    Last Pain:  Filed Vitals:   02/02/16 2021  PainSc: 4                  [name]

## 2016-02-02 NOTE — Anesthesia Preprocedure Evaluation (Signed)
Anesthesia Evaluation  Patient identified by MRN, date of birth, ID band Patient awake    Reviewed: Allergy & Precautions, NPO status , Patient's Chart, lab work & pertinent test results  Airway Mallampati: II       Dental  (+) Edentulous Upper   Pulmonary sleep apnea , former smoker,    breath sounds clear to auscultation       Cardiovascular hypertension, + Peripheral Vascular Disease   Rhythm:Regular Rate:Normal     Neuro/Psych    GI/Hepatic GERD  ,  Endo/Other    Renal/GU Renal disease     Musculoskeletal  (+) Arthritis ,   Abdominal (+) + obese,   Peds  Hematology   Anesthesia Other Findings   Reproductive/Obstetrics                             Anesthesia Physical Anesthesia Plan  ASA: III  Anesthesia Plan: General   Post-op Pain Management:    Induction: Intravenous  Airway Management Planned: Oral ETT  Additional Equipment:   Intra-op Plan:   Post-operative Plan: Extubation in OR  Informed Consent: I have reviewed the patients History and Physical, chart, labs and discussed the procedure including the risks, benefits and alternatives for the proposed anesthesia with the patient or authorized representative who has indicated his/her understanding and acceptance.   Dental advisory given  Plan Discussed with: CRNA and Surgeon  Anesthesia Plan Comments:         Anesthesia Quick Evaluation

## 2016-02-03 ENCOUNTER — Encounter (HOSPITAL_COMMUNITY): Payer: Self-pay | Admitting: Orthopaedic Surgery

## 2016-02-03 DIAGNOSIS — M1990 Unspecified osteoarthritis, unspecified site: Secondary | ICD-10-CM | POA: Diagnosis not present

## 2016-02-03 DIAGNOSIS — M4806 Spinal stenosis, lumbar region: Secondary | ICD-10-CM | POA: Diagnosis not present

## 2016-02-03 DIAGNOSIS — R269 Unspecified abnormalities of gait and mobility: Secondary | ICD-10-CM | POA: Diagnosis not present

## 2016-02-03 DIAGNOSIS — M25559 Pain in unspecified hip: Secondary | ICD-10-CM | POA: Diagnosis not present

## 2016-02-03 LAB — BASIC METABOLIC PANEL
Anion gap: 12 (ref 5–15)
BUN: 10 mg/dL (ref 6–20)
CO2: 22 mmol/L (ref 22–32)
Calcium: 8.5 mg/dL — ABNORMAL LOW (ref 8.9–10.3)
Chloride: 108 mmol/L (ref 101–111)
Creatinine, Ser: 1.31 mg/dL — ABNORMAL HIGH (ref 0.44–1.00)
GFR calc Af Amer: 45 mL/min — ABNORMAL LOW (ref >60)
GFR calc non Af Amer: 39 mL/min — ABNORMAL LOW (ref >60)
Glucose, Bld: 115 mg/dL — ABNORMAL HIGH (ref 65–99)
Potassium: 4 mmol/L (ref 3.5–5.1)
Sodium: 142 mmol/L (ref 135–145)

## 2016-02-03 LAB — CBC
HCT: 29.9 % — ABNORMAL LOW (ref 36.0–46.0)
Hemoglobin: 9.8 g/dL — ABNORMAL LOW (ref 12.0–15.0)
MCH: 31.6 pg (ref 26.0–34.0)
MCHC: 32.8 g/dL (ref 30.0–36.0)
MCV: 96.5 fL (ref 78.0–100.0)
Platelets: 160 10*3/uL (ref 150–400)
RBC: 3.1 MIL/uL — ABNORMAL LOW (ref 3.87–5.11)
RDW: 16.2 % — ABNORMAL HIGH (ref 11.5–15.5)
WBC: 6.1 10*3/uL (ref 4.0–10.5)

## 2016-02-03 MED ORDER — HYDROCODONE-ACETAMINOPHEN 5-325 MG PO TABS: 1.0000 | ORAL_TABLET | Freq: Four times a day (QID) | ORAL | Status: DC | PRN

## 2016-02-03 NOTE — Op Note (Signed)
NAMEKADIE, SEISER NO.:  [id number]  MEDICAL RECORD NO.:  [id number]  LOCATION:  5N14C                        FACILITY:  [facility]  PHYSICIAN:  Ka Bench C. Lorin Mercy, M.D.    DATE OF BIRTH:  June 21, 1941  DATE OF PROCEDURE:  02/02/2016 DATE OF DISCHARGE:                              OPERATIVE REPORT   PREOPERATIVE DIAGNOSIS:  L4-5 central and lateral recess stenosis.  POSTOPERATIVE DIAGNOSIS:  L4-5 central and lateral recess stenosis.  PROCEDURE:  L4-5 central decompression.  SURGEON:  Koran Seabrook C. Lorin Mercy, M.D.  ASSISTANT:  Alyson Locket. Ricard Dillon, PA-C, medically necessary and present for the entire procedure.  ANESTHESIA:  GOT plus Marcaine local.  ESTIMATED BLOOD LOSS:  Minimal.  DESCRIPTION OF PROCEDURE:  After induction of general anesthesia, orotracheal intubation, the patient was placed prone on chest rolls. Ancef was given prophylactically, prepping and draping was performed with DuraPrep.  The patient received 3 g of Ancef prophylaxis based on weight and time-out procedure was completed.  After the area was squared with towels, Betadine and Steri-Drape applied and laminectomy sheet. Needles were placed based on palpable landmarks in the iliac crest expecting to be at the L4-5 level.  Needle showed that they were just above and below the L3-4 level.  The top needle was then used to jump over the lower needle and repositioned.  Second x-ray taken at that point which showed that we were in correct position at L4-5.  Midline incision was made.  Cerebellar retractors were placed.  Some general oozing was noted.  The patient has been on aspirin and had not stopped her aspirin.  Subperiosteal dissection onto the lamina was performed, and then 2 Kocher clamps were placed to make sure we were just above and below the planned level of decompression and this was the third x-ray. With this taken, bone was marked with a purple marker and then central decompression was performed  removing spinous process, thinning the lamina with a rongeur, using the bur to thin the lamina further, top portion of L5 was performed as well as L4 right and left partial hemi- laminectomy.  Small portion of the top portion of the lamina was left where there was epidural fat present.  Very thick chunks of ligament were removed, bone was removed out to the level of the pedicle.  The patient had epidurals, and there was some adherence on the left side. The dural separator was used to gently dissect the adherent ligaments from the dura with the dura protected with patties and the D'Errico. Bone spurs removed laterally and then remaining chunks of ligament were removed.  There was good decompression.  Right and left lateral gutters were run again.  Some Surgiflo was squirted into the gutters, left for 3 minutes, suctioned, and epidural space was dry. Irrigation with saline solution copiously, and then standard layered closure with #1 Vicryl, 2- 0 Vicryl subcuticular closure, Dermabond on the skin, postop dressing and Marcaine infiltration, transferred to recovery room in stable condition.  Instrument count and needle count was correct.     Sherah Lund C. Lorin Mercy, M.D.     MCY/MEDQ  D:  02/02/2016  T:  02/03/2016  Job:  [id number]

## 2016-02-03 NOTE — Evaluation (Signed)
Physical Therapy Evaluation Patient Details Name: [name] MRN: [id number] DOB: [birth year] Today's Date: 02/03/2016   History of Present Illness  75 y.o. female admitted with L4-5 central and lateral recess stenosis s/p central decompression. PMH: bilateral THA, DDD, Depression, TIA.   Clinical Impression  Patient is s/p above surgery resulting in the deficits listed below (see PT Problem List). Patient will benefit from skilled PT to increase their independence and safety with mobility (while adhering to their precautions) to allow discharge to home. Pt and daughter both confirm feeling confident with going home when D/C .     Follow Up Recommendations Home health PT;Supervision for mobility/OOB    Equipment Recommendations  Rolling walker with 5" wheels    Recommendations for Other Services       Precautions / Restrictions Precautions Precautions: Back Precaution Booklet Issued: Yes (comment) Precaution Comments: back precaution handout provided and reviewed with pt and daughter Restrictions Weight Bearing Restrictions: No      Mobility  Bed Mobility Overal bed mobility: Needs Assistance Bed Mobility: Rolling;Sidelying to Sit;Sit to Sidelying Rolling: Min assist Sidelying to sit: Mod assist;Min assist (mod X 1 attempt, min X1 attempt)     Sit to sidelying: Min assist (min assist with LEs) General bed mobility comments: Pt and daughter educated on logroll technique. Assist needed for sequence.   Transfers Overall transfer level: Needs assistance Equipment used: Rolling walker (2 wheeled) Transfers: Sit to/from Stand Sit to Stand: Min guard         General transfer comment: cues for hand position and back precautions  Ambulation/Gait Ambulation/Gait assistance: Min guard Ambulation Distance (Feet): 160 Feet Assistive device: Rolling walker (2 wheeled) Gait Pattern/deviations: Step-through pattern Gait velocity: decreased   General Gait Details: Cues  for posture needed, stable pattern no loss of balance.   Stairs            Wheelchair Mobility    Modified Rankin (Stroke Patients Only)       Balance Overall balance assessment: Needs assistance Sitting-balance support: No upper extremity supported Sitting balance-Leahy Scale: Good     Standing balance support: Bilateral upper extremity supported Standing balance-Leahy Scale: Poor Standing balance comment: using rw for support                             Pertinent Vitals/Pain Pain Assessment: No/denies pain    Home Living Family/patient expects to be discharged to:: Private residence Living Arrangements: Children Available Help at Discharge: Available 24 hours/day;Family Type of Home: House Home Access: Ramped entrance     Home Layout: One level Home Equipment: Walker - 4 wheels;Cane - single point      Prior Function Level of Independence: Independent with assistive device(s)         Comments: previously using cane with ambulation or rollator for longer distances     Hand Dominance        Extremity/Trunk Assessment   Upper Extremity Assessment: Overall WFL for tasks assessed           Lower Extremity Assessment: Overall WFL for tasks assessed         Communication   Communication: No difficulties  Cognition   Behavior During Therapy: WFL for tasks assessed/performed Overall Cognitive Status: Within Functional Limits for tasks assessed                      General Comments General comments (skin integrity, edema, etc.): Reviewing  functional activities and back precautions with patient and daughter. Both deny questions by end of session.     Exercises        Assessment/Plan    PT Assessment Patient needs continued PT services  PT Diagnosis Difficulty walking   PT Problem List Decreased strength;Decreased activity tolerance;Decreased balance;Decreased mobility;Decreased knowledge of precautions  PT Treatment  Interventions DME instruction;Gait training;Stair training;Functional mobility training;Therapeutic activities;Therapeutic exercise;Balance training;Patient/family education   PT Goals (Current goals can be found in the Care Plan section) Acute Rehab PT Goals Patient Stated Goal: go home PT Goal Formulation: With patient Time For Goal Achievement: 02/10/16 Potential to Achieve Goals: Good    Frequency Min 5X/week   Barriers to discharge        Co-evaluation               End of Session Equipment Utilized During Treatment: Gait belt Activity Tolerance: Patient tolerated treatment well Patient left: in bed;with call bell/phone within reach;with family/visitor present;with SCD's reapplied Nurse Communication: Mobility status    Functional Assessment Tool Used: clinical judgment Functional Limitation: Mobility: Walking and moving around Mobility: Walking and Moving Around Current Status [id number]): At least 20 percent but less than 40 percent impaired, limited or restricted Mobility: Walking and Moving Around Goal Status [phone]): At least 20 percent but less than 40 percent impaired, limited or restricted    Time: [id number] PT Time Calculation (min) (ACUTE ONLY): 31 min   Charges:   PT Evaluation $PT Eval Moderate Complexity: 1 Procedure PT Treatments $Therapeutic Activity: 8-22 mins   PT G Codes:   PT G-Codes **NOT FOR INPATIENT CLASS** Functional Assessment Tool Used: clinical judgment Functional Limitation: Mobility: Walking and moving around Mobility: Walking and Moving Around Current Status [id number]): At least 20 percent but less than 40 percent impaired, limited or restricted Mobility: Walking and Moving Around Goal Status [phone]): At least 20 percent but less than 40 percent impaired, limited or restricted    Cassell Clement, PT, [place] Pager [phone] Office [phone]  02/03/2016, 3:58 PM

## 2016-02-03 NOTE — Care Management Note (Signed)
Case Management Note  Patient Details  Name: HELIA HOUSEKNECHT MRN: [id number] Date of Birth: Apr 17, 1941  Subjective/Objective:       S/p L4-5 central decompression             Action/Plan: Spoke with patient and her daughter about discharge plan. Daughter will be assisting patient after discharge. Gave choice ,they selected [facility]. Contacted [facility] and set up HHPT and aide visits. [facility] delivered rolling walker to patient's room no other equipment needs identified.      Expected Discharge Date:                  Expected Discharge Plan:  [place]  In-House Referral:  NA  Discharge planning Services  CM Consult  Post Acute Care Choice:  Durable Medical Equipment, Home Health Choice offered to:  Patient  DME Arranged:  Walker rolling DME Agency:  [facility] Arranged:  PT, Nurse's Aide [place] Agency:  [facility]  Status of Service:     Medicare Important Message Given:    Date Medicare IM Given:    Medicare IM give by:    Date Additional Medicare IM Given:    Additional Medicare Important Message give by:     If discussed at [place] Level of Stay Meetings, dates discussed:    Additional Comments:  Nila Nephew, RN 02/03/2016, 3:32 PM

## 2016-02-03 NOTE — Progress Notes (Signed)
Subjective: Doing well this morning.  Pain controlled.  Wants to go home today.   Objective: Vital signs in last 24 hours: Temp:  [97.6 F (36.4 C)-100.2 F (37.9 C)] 100.2 F (37.9 C) (04/06 0522) Pulse Rate:  [70-95] 74 (04/05 1955) Resp:  [11-23] 18 (04/06 0522) BP: (75-109)/(30-75) 105/52 mmHg (04/06 1104) SpO2:  [95 %-100 %] 95 % (04/06 0522)  Intake/Output from previous day: 04/05 0701 - 04/06 0700 In: 2261.7 [P.O.:340; I.V.:1921.7] Out: 220 [Urine:200; Blood:20] Intake/Output this shift:     Recent Labs  02/03/16 0601  HGB 9.8*    Recent Labs  02/03/16 0601  WBC 6.1  RBC 3.10*  HCT 29.9*  PLT 160    Recent Labs  02/03/16 0601  NA 142  K 4.0  CL 108  CO2 22  BUN 10  CREATININE 1.31*  GLUCOSE 115*  CALCIUM 8.5*   No results for input(s): LABPT, INR in the last 72 hours.  Exam:  Alert and oriented.  Dressing c/d/i.  bilat calves nontender.  NVI.  No focal motor deficits.   Assessment/Plan: Will see how she ambulates with PT today.  D/c home if does well.  Script on chart for norco.    Mckinna Demars M 02/03/2016, 11:19 AM

## 2016-02-03 NOTE — Care Management Obs Status (Signed)
[facility] NOTIFICATION   Patient Details  Name: [name] MRN: [id number] Date of Birth: 05/06/1941   Medicare Observation Status Notification Given:  Yes    Nila Nephew, RN 02/03/2016, 3:29 PM

## 2016-02-03 NOTE — Progress Notes (Signed)
Dressing to Iv site on left hand bleeding with gauze almost saturated.  Dressing changed and site unremarkable.

## 2016-02-04 DIAGNOSIS — Z8673 Personal history of transient ischemic attack (TIA), and cerebral infarction without residual deficits: Secondary | ICD-10-CM | POA: Diagnosis not present

## 2016-02-04 DIAGNOSIS — M47896 Other spondylosis, lumbar region: Secondary | ICD-10-CM | POA: Diagnosis not present

## 2016-02-04 DIAGNOSIS — I10 Essential (primary) hypertension: Secondary | ICD-10-CM | POA: Diagnosis not present

## 2016-02-04 DIAGNOSIS — E785 Hyperlipidemia, unspecified: Secondary | ICD-10-CM | POA: Diagnosis not present

## 2016-02-04 DIAGNOSIS — M545 Low back pain: Secondary | ICD-10-CM | POA: Diagnosis not present

## 2016-02-04 DIAGNOSIS — Z8614 Personal history of Methicillin resistant Staphylococcus aureus infection: Secondary | ICD-10-CM | POA: Diagnosis not present

## 2016-02-04 DIAGNOSIS — Z7951 Long term (current) use of inhaled steroids: Secondary | ICD-10-CM | POA: Diagnosis not present

## 2016-02-04 DIAGNOSIS — M069 Rheumatoid arthritis, unspecified: Secondary | ICD-10-CM | POA: Diagnosis not present

## 2016-02-04 DIAGNOSIS — I872 Venous insufficiency (chronic) (peripheral): Secondary | ICD-10-CM | POA: Diagnosis not present

## 2016-02-04 DIAGNOSIS — Z8541 Personal history of malignant neoplasm of cervix uteri: Secondary | ICD-10-CM | POA: Diagnosis not present

## 2016-02-04 DIAGNOSIS — Z4789 Encounter for other orthopedic aftercare: Secondary | ICD-10-CM | POA: Diagnosis not present

## 2016-02-04 DIAGNOSIS — M4806 Spinal stenosis, lumbar region: Secondary | ICD-10-CM | POA: Diagnosis not present

## 2016-02-04 DIAGNOSIS — K219 Gastro-esophageal reflux disease without esophagitis: Secondary | ICD-10-CM | POA: Diagnosis not present

## 2016-02-04 DIAGNOSIS — F329 Major depressive disorder, single episode, unspecified: Secondary | ICD-10-CM | POA: Diagnosis not present

## 2016-02-04 DIAGNOSIS — Z7901 Long term (current) use of anticoagulants: Secondary | ICD-10-CM | POA: Diagnosis not present

## 2016-02-09 DIAGNOSIS — M069 Rheumatoid arthritis, unspecified: Secondary | ICD-10-CM | POA: Diagnosis not present

## 2016-02-09 DIAGNOSIS — F329 Major depressive disorder, single episode, unspecified: Secondary | ICD-10-CM | POA: Diagnosis not present

## 2016-02-09 DIAGNOSIS — M25559 Pain in unspecified hip: Secondary | ICD-10-CM | POA: Diagnosis not present

## 2016-02-09 DIAGNOSIS — K219 Gastro-esophageal reflux disease without esophagitis: Secondary | ICD-10-CM | POA: Diagnosis not present

## 2016-02-09 DIAGNOSIS — Z7901 Long term (current) use of anticoagulants: Secondary | ICD-10-CM | POA: Diagnosis not present

## 2016-02-09 DIAGNOSIS — Z8673 Personal history of transient ischemic attack (TIA), and cerebral infarction without residual deficits: Secondary | ICD-10-CM | POA: Diagnosis not present

## 2016-02-09 DIAGNOSIS — M1990 Unspecified osteoarthritis, unspecified site: Secondary | ICD-10-CM | POA: Diagnosis not present

## 2016-02-09 DIAGNOSIS — Z4789 Encounter for other orthopedic aftercare: Secondary | ICD-10-CM | POA: Diagnosis not present

## 2016-02-09 DIAGNOSIS — Z8541 Personal history of malignant neoplasm of cervix uteri: Secondary | ICD-10-CM | POA: Diagnosis not present

## 2016-02-09 DIAGNOSIS — M545 Low back pain: Secondary | ICD-10-CM | POA: Diagnosis not present

## 2016-02-09 DIAGNOSIS — E785 Hyperlipidemia, unspecified: Secondary | ICD-10-CM | POA: Diagnosis not present

## 2016-02-09 DIAGNOSIS — R269 Unspecified abnormalities of gait and mobility: Secondary | ICD-10-CM | POA: Diagnosis not present

## 2016-02-09 DIAGNOSIS — Z7951 Long term (current) use of inhaled steroids: Secondary | ICD-10-CM | POA: Diagnosis not present

## 2016-02-09 DIAGNOSIS — M4806 Spinal stenosis, lumbar region: Secondary | ICD-10-CM | POA: Diagnosis not present

## 2016-02-09 DIAGNOSIS — Z8614 Personal history of Methicillin resistant Staphylococcus aureus infection: Secondary | ICD-10-CM | POA: Diagnosis not present

## 2016-02-09 DIAGNOSIS — I10 Essential (primary) hypertension: Secondary | ICD-10-CM | POA: Diagnosis not present

## 2016-02-09 DIAGNOSIS — M47896 Other spondylosis, lumbar region: Secondary | ICD-10-CM | POA: Diagnosis not present

## 2016-02-09 DIAGNOSIS — I872 Venous insufficiency (chronic) (peripheral): Secondary | ICD-10-CM | POA: Diagnosis not present

## 2016-02-10 DIAGNOSIS — M4806 Spinal stenosis, lumbar region: Secondary | ICD-10-CM | POA: Diagnosis not present

## 2016-02-10 DIAGNOSIS — M069 Rheumatoid arthritis, unspecified: Secondary | ICD-10-CM | POA: Diagnosis not present

## 2016-02-10 DIAGNOSIS — F329 Major depressive disorder, single episode, unspecified: Secondary | ICD-10-CM | POA: Diagnosis not present

## 2016-02-10 DIAGNOSIS — Z4789 Encounter for other orthopedic aftercare: Secondary | ICD-10-CM | POA: Diagnosis not present

## 2016-02-10 DIAGNOSIS — Z8541 Personal history of malignant neoplasm of cervix uteri: Secondary | ICD-10-CM | POA: Diagnosis not present

## 2016-02-10 DIAGNOSIS — M545 Low back pain: Secondary | ICD-10-CM | POA: Diagnosis not present

## 2016-02-10 DIAGNOSIS — M47896 Other spondylosis, lumbar region: Secondary | ICD-10-CM | POA: Diagnosis not present

## 2016-02-10 DIAGNOSIS — Z7951 Long term (current) use of inhaled steroids: Secondary | ICD-10-CM | POA: Diagnosis not present

## 2016-02-10 DIAGNOSIS — Z8614 Personal history of Methicillin resistant Staphylococcus aureus infection: Secondary | ICD-10-CM | POA: Diagnosis not present

## 2016-02-10 DIAGNOSIS — I10 Essential (primary) hypertension: Secondary | ICD-10-CM | POA: Diagnosis not present

## 2016-02-10 DIAGNOSIS — Z8673 Personal history of transient ischemic attack (TIA), and cerebral infarction without residual deficits: Secondary | ICD-10-CM | POA: Diagnosis not present

## 2016-02-10 DIAGNOSIS — Z7901 Long term (current) use of anticoagulants: Secondary | ICD-10-CM | POA: Diagnosis not present

## 2016-02-10 DIAGNOSIS — K219 Gastro-esophageal reflux disease without esophagitis: Secondary | ICD-10-CM | POA: Diagnosis not present

## 2016-02-10 DIAGNOSIS — I872 Venous insufficiency (chronic) (peripheral): Secondary | ICD-10-CM | POA: Diagnosis not present

## 2016-02-10 DIAGNOSIS — E785 Hyperlipidemia, unspecified: Secondary | ICD-10-CM | POA: Diagnosis not present

## 2016-02-15 DIAGNOSIS — M47896 Other spondylosis, lumbar region: Secondary | ICD-10-CM | POA: Diagnosis not present

## 2016-02-15 DIAGNOSIS — F329 Major depressive disorder, single episode, unspecified: Secondary | ICD-10-CM | POA: Diagnosis not present

## 2016-02-15 DIAGNOSIS — K219 Gastro-esophageal reflux disease without esophagitis: Secondary | ICD-10-CM | POA: Diagnosis not present

## 2016-02-15 DIAGNOSIS — Z7951 Long term (current) use of inhaled steroids: Secondary | ICD-10-CM | POA: Diagnosis not present

## 2016-02-15 DIAGNOSIS — Z8614 Personal history of Methicillin resistant Staphylococcus aureus infection: Secondary | ICD-10-CM | POA: Diagnosis not present

## 2016-02-15 DIAGNOSIS — M4806 Spinal stenosis, lumbar region: Secondary | ICD-10-CM | POA: Diagnosis not present

## 2016-02-15 DIAGNOSIS — Z7901 Long term (current) use of anticoagulants: Secondary | ICD-10-CM | POA: Diagnosis not present

## 2016-02-15 DIAGNOSIS — M545 Low back pain: Secondary | ICD-10-CM | POA: Diagnosis not present

## 2016-02-15 DIAGNOSIS — E785 Hyperlipidemia, unspecified: Secondary | ICD-10-CM | POA: Diagnosis not present

## 2016-02-15 DIAGNOSIS — I10 Essential (primary) hypertension: Secondary | ICD-10-CM | POA: Diagnosis not present

## 2016-02-15 DIAGNOSIS — Z8541 Personal history of malignant neoplasm of cervix uteri: Secondary | ICD-10-CM | POA: Diagnosis not present

## 2016-02-15 DIAGNOSIS — I872 Venous insufficiency (chronic) (peripheral): Secondary | ICD-10-CM | POA: Diagnosis not present

## 2016-02-15 DIAGNOSIS — M069 Rheumatoid arthritis, unspecified: Secondary | ICD-10-CM | POA: Diagnosis not present

## 2016-02-15 DIAGNOSIS — Z8673 Personal history of transient ischemic attack (TIA), and cerebral infarction without residual deficits: Secondary | ICD-10-CM | POA: Diagnosis not present

## 2016-02-15 DIAGNOSIS — Z4789 Encounter for other orthopedic aftercare: Secondary | ICD-10-CM | POA: Diagnosis not present

## 2016-02-15 NOTE — Discharge Summary (Signed)
Patient ID: [name] MRN: [id number] DOB/AGE: [birth year] 75 y.o.  Admit date: 02/02/2016 Discharge date: 02/15/2016  Admission Diagnoses:  Active Problems:   History of lumbar laminectomy for spinal cord decompression   Discharge Diagnoses:  Active Problems:   History of lumbar laminectomy for spinal cord decompression  status post Procedure(s): L4-5 Decompression  Past Medical History  Diagnosis Date  . History of cervical cancer 1972  . Ovarian cancer ([place]) 1972    S/P oophorectomy  . GERD (gastroesophageal reflux disease)   . Gout   . Hypertension   . DJD (degenerative joint disease), multiple sites     Low back pain worst  . DDD (degenerative disc disease), lumbosacral   . Osteoarthritis of hip     bilateral hips  . Hx-TIA (transient ischemic attack) 05/13/2001    Right facial numbness  . Sensorineural hearing loss of both ears   . Mitral valve prolapse 10/27/1999    Subsequent 2D echo show normal mitral valve  . Chronic venous insufficiency   . Colon, diverticulosis Sept. 2011    outpatient colonoscopy by Dr. Cristina Gong.  Need record  . Hyperlipidemia   . Recurrent boils     History of MRSA skin infections with abscess  . Gout     Uric Acid level 4.2 on 300 allopurinol  . Depression   . Psychosis     Surgeries: Procedure(s): L4-5 Decompression on 02/02/2016   Consultants:    Discharged Condition: Improved  Hospital Course: [name] is an 75 y.o. female who was admitted 02/02/2016 for operative treatment of lumbar stenosis.  Patient failed conservative treatments (please see the history and physical for the specifics) and had severe unremitting pain that affects sleep, daily activities and work/hobbies. After pre-op clearance, the patient was taken to the operating room on 02/02/2016 and underwent  Procedure(s): L4-5 Decompression.    Patient was given perioperative antibiotics:  Anti-infectives    Start     Dose/Rate Route Frequency Ordered Stop    02/03/16 2000  ceFAZolin (ANCEF) IVPB 1 g/50 mL premix  Status:  Discontinued     1 g 100 mL/hr over 30 Minutes Intravenous Every 8 hours 02/02/16 1845 02/03/16 2118   02/02/16 1845  ceFAZolin (ANCEF) IVPB 1 g/50 mL premix  Status:  Discontinued     1 g 100 mL/hr over 30 Minutes Intravenous Every 8 hours 02/02/16 1832 02/02/16 1845   02/02/16 1200  ceFAZolin (ANCEF) IVPB 2g/100 mL premix     2 g 200 mL/hr over 30 Minutes Intravenous To [facility] 02/01/16 1228 02/02/16 1305       Patient was given sequential compression devices and early ambulation to prevent DVT.   Patient benefited maximally from hospital stay and there were no complications. At the time of discharge, the patient was urinating/moving their bowels without difficulty, tolerating a regular diet, pain is controlled with oral pain medications and they have been cleared by PT/OT.   Recent vital signs: No data found.    Recent laboratory studies: No results for input(s): WBC, HGB, HCT, PLT, NA, K, CL, CO2, BUN, CREATININE, GLUCOSE, INR, CALCIUM in the last 72 hours.  Invalid input(s): PT, 2   Discharge Medications:     Medication List    STOP taking these medications        traMADol 50 MG tablet  Commonly known as:  ULTRAM      TAKE these medications        albuterol 108 (90 Base) MCG/ACT inhaler  Commonly known as:  PROVENTIL HFA;VENTOLIN HFA  Inhale 2 puffs into the lungs every 6 (six) hours as needed for wheezing or shortness of breath.     ALIVE WOMENS ENERGY Tabs  Take by mouth.     allopurinol 300 MG tablet  Commonly known as:  ZYLOPRIM  TAKE 1 TABLET BY MOUTH DAILY     amLODipine 10 MG tablet  Commonly known as:  NORVASC  Take 1 tablet (10 mg total) by mouth daily.     ascorbic acid 500 MG tablet  Commonly known as:  VITAMIN C  Take 500 mg by mouth daily.     aspirin EC 81 MG tablet  Take 81 mg by mouth daily.     atorvastatin 40 MG tablet  Commonly known as:  LIPITOR  TAKE 1  TABLET BY MOUTH EVERY DAY     buPROPion 300 MG 24 hr tablet  Commonly known as:  WELLBUTRIN XL  Take 1 tablet (300 mg total) by mouth every morning.     calcium-vitamin D 500-200 MG-UNIT tablet  Take 1 tablet by mouth daily.     diphenhydrAMINE 25 MG tablet  Commonly known as:  BENADRYL  Take 25 mg by mouth every 6 (six) hours as needed.     docusate sodium 100 MG capsule  Commonly known as:  COLACE  Take 1 capsule (100 mg total) by mouth every 12 (twelve) hours.     fluocinonide cream 0.05 %  Commonly known as:  LIDEX  APPLY TO AFFECTED AREA TWICE DAILY     fluticasone 50 MCG/ACT nasal spray  Commonly known as:  FLONASE  INSTILL 2 SPRAYS IN EACH NOSTRIL DAILY     furosemide 20 MG tablet  Commonly known as:  LASIX  Take 1 tablet (20 mg total) by mouth daily.     haloperidol 1 MG tablet  Commonly known as:  HALDOL  TAKE ONE TABLET BY MOUTH DAILY AT BEDTIME.     HYDROcodone-acetaminophen 5-325 MG tablet  Commonly known as:  NORCO/VICODIN  Take 1 tablet by mouth every 6 (six) hours as needed (mild pain).     losartan 100 MG tablet  Commonly known as:  COZAAR  TAKE ONE TABLET BY MOUTH DAILY     nystatin powder  Commonly known as:  MYCOSTATIN  APPLY TO AFFECTED AREA TWICE DAILY     OVER THE COUNTER MEDICATION  calciboost     oxybutynin 5 MG 24 hr tablet  Commonly known as:  DITROPAN-XL  TAKE ONE TABLET BY MOUTH AT BEDTIME     pantoprazole 40 MG tablet  Commonly known as:  PROTONIX  Take 1 tablet (40 mg total) by mouth 2 (two) times daily before a meal.     potassium chloride 10 MEQ CR capsule  Commonly known as:  MICRO-K  TAKE 1 CAPSULE BY MOUTH DAILY     senna 8.6 MG Tabs tablet  Commonly known as:  SENOKOT  Take 1 tablet by mouth every other day.        Diagnostic Studies: Dg Chest 2 View  01/25/2016  CLINICAL DATA:  Hypertension.  Preoperative.  No chest complaints. EXAM: CHEST  2 VIEW COMPARISON:  January 03, 2015 FINDINGS: The heart size and  mediastinal contours are stable. The aorta is tortuous. Both lungs are clear. The visualized skeletal structures are stable P IMPRESSION: No active cardiopulmonary disease. Electronically Signed   By: Abelardo Diesel M.D.   On: 01/25/2016 14:59   Dg Lumbar Spine 2-3 Views  02/02/2016  CLINICAL DATA:  L4-L5 decompression EXAM: LUMBAR SPINE - 2-3 VIEW COMPARISON:  Lumbar spine MRI 12/31/2015 FINDINGS: Three lateral views of the lumbar spine submitted. On first view there is a metallic needle at the mid level of L3 vertebral body posteriorly. A second more inferior needle just above the spinous process of L4. Second view there is a metallic needle posterior just above the spinous process of L4. A second needle at the level of spinous process of L5. Third view posterior metallic instruments are noted L4-L5 level. IMPRESSION: On first view there is a metallic needle at the mid level of L3 vertebral body posteriorly. A second more inferior needle just above the spinous process of L4. Second view there is a metallic needle posterior just above the spinous process of L4. A second needle at the level of spinous process of L5. Third view posterior metallic instruments are noted L4-L5 level. Electronically Signed   By: Lahoma Crocker M.D.   On: 02/02/2016 13:26        Discharge Instructions    Call MD / Call 911    Complete by:  As directed   If you experience chest pain or shortness of breath, CALL 911 and be transported to the hospital emergency room.  If you develope a fever above 101 F, pus (white drainage) or increased drainage or redness at the wound, or calf pain, call your surgeon's office.     Constipation Prevention    Complete by:  As directed   Drink plenty of fluids.  Prune juice may be helpful.  You may use a stool softener, such as Colace (over the counter) 100 mg twice a day.  Use MiraLax (over the counter) for constipation as needed.     Diet - low sodium heart healthy    Complete by:  As directed       Discharge instructions    Complete by:  As directed   Ok to shower 5 days postop.  Do not apply any creams or ointments to incision.  Do not remove steri-strips.  Can use 4x4 gauze and tape for dressing changes.  No aggressive activity.  No bending, squatting or prolonged sitting.  Mostly be in reclined position or lying down.     Driving restrictions    Complete by:  As directed   No driving     Increase activity slowly as tolerated    Complete by:  As directed      Lifting restrictions    Complete by:  As directed   No lifting           Follow-up Information    Schedule an appointment as soon as possible for a visit with Marybelle Killings, MD.   Specialty:  Orthopedic Surgery   Why:  need return office visit one week postop   Contact information:   [place] [address] [phone]       Discharge Plan:  discharge to home  Disposition:     Signed: Lanae Crumbly 02/15/2016, 3:55 PM

## 2016-02-17 DIAGNOSIS — F329 Major depressive disorder, single episode, unspecified: Secondary | ICD-10-CM | POA: Diagnosis not present

## 2016-02-17 DIAGNOSIS — M545 Low back pain: Secondary | ICD-10-CM | POA: Diagnosis not present

## 2016-02-17 DIAGNOSIS — Z4789 Encounter for other orthopedic aftercare: Secondary | ICD-10-CM | POA: Diagnosis not present

## 2016-02-17 DIAGNOSIS — I872 Venous insufficiency (chronic) (peripheral): Secondary | ICD-10-CM | POA: Diagnosis not present

## 2016-02-17 DIAGNOSIS — Z7951 Long term (current) use of inhaled steroids: Secondary | ICD-10-CM | POA: Diagnosis not present

## 2016-02-17 DIAGNOSIS — M47896 Other spondylosis, lumbar region: Secondary | ICD-10-CM | POA: Diagnosis not present

## 2016-02-17 DIAGNOSIS — M069 Rheumatoid arthritis, unspecified: Secondary | ICD-10-CM | POA: Diagnosis not present

## 2016-02-17 DIAGNOSIS — Z8614 Personal history of Methicillin resistant Staphylococcus aureus infection: Secondary | ICD-10-CM | POA: Diagnosis not present

## 2016-02-17 DIAGNOSIS — M4806 Spinal stenosis, lumbar region: Secondary | ICD-10-CM | POA: Diagnosis not present

## 2016-02-17 DIAGNOSIS — Z8541 Personal history of malignant neoplasm of cervix uteri: Secondary | ICD-10-CM | POA: Diagnosis not present

## 2016-02-17 DIAGNOSIS — E785 Hyperlipidemia, unspecified: Secondary | ICD-10-CM | POA: Diagnosis not present

## 2016-02-17 DIAGNOSIS — Z7901 Long term (current) use of anticoagulants: Secondary | ICD-10-CM | POA: Diagnosis not present

## 2016-02-17 DIAGNOSIS — K219 Gastro-esophageal reflux disease without esophagitis: Secondary | ICD-10-CM | POA: Diagnosis not present

## 2016-02-17 DIAGNOSIS — Z8673 Personal history of transient ischemic attack (TIA), and cerebral infarction without residual deficits: Secondary | ICD-10-CM | POA: Diagnosis not present

## 2016-02-17 DIAGNOSIS — I10 Essential (primary) hypertension: Secondary | ICD-10-CM | POA: Diagnosis not present

## 2016-02-25 ENCOUNTER — Other Ambulatory Visit: Payer: Self-pay | Admitting: Internal Medicine

## 2016-02-25 ENCOUNTER — Other Ambulatory Visit (HOSPITAL_COMMUNITY): Payer: Self-pay | Admitting: Psychiatry

## 2016-02-28 NOTE — Telephone Encounter (Signed)
Tramadol was stopped by Dr Ricard Dillon (ortho) 4/6 when he D/C'd her after ortho procedure. If she states that Dr Ricard Dillon is no longer Rx'ing it, I will consider Rx'ing tramadol but will hold her to NO pain meds from [facility].

## 2016-03-15 DIAGNOSIS — M25552 Pain in left hip: Secondary | ICD-10-CM | POA: Diagnosis not present

## 2016-03-15 DIAGNOSIS — M545 Low back pain: Secondary | ICD-10-CM | POA: Diagnosis not present

## 2016-03-15 DIAGNOSIS — M4806 Spinal stenosis, lumbar region: Secondary | ICD-10-CM | POA: Diagnosis not present

## 2016-03-15 DIAGNOSIS — M25551 Pain in right hip: Secondary | ICD-10-CM | POA: Diagnosis not present

## 2016-03-23 ENCOUNTER — Other Ambulatory Visit: Payer: Self-pay | Admitting: Internal Medicine

## 2016-03-23 ENCOUNTER — Other Ambulatory Visit (HOSPITAL_COMMUNITY): Payer: Self-pay | Admitting: Psychiatry

## 2016-03-24 ENCOUNTER — Other Ambulatory Visit (HOSPITAL_COMMUNITY): Payer: Self-pay | Admitting: Psychiatry

## 2016-03-24 DIAGNOSIS — M25552 Pain in left hip: Secondary | ICD-10-CM | POA: Diagnosis not present

## 2016-03-24 NOTE — Telephone Encounter (Signed)
She needs to call and make an appt with me. Once that is complete, I will fill enough to last to appt

## 2016-03-30 ENCOUNTER — Other Ambulatory Visit (HOSPITAL_COMMUNITY): Payer: Self-pay | Admitting: Psychiatry

## 2016-03-30 ENCOUNTER — Other Ambulatory Visit: Payer: Self-pay | Admitting: Internal Medicine

## 2016-03-31 NOTE — Telephone Encounter (Signed)
Needs appt with me next 90 days

## 2016-04-06 ENCOUNTER — Ambulatory Visit (INDEPENDENT_AMBULATORY_CARE_PROVIDER_SITE_OTHER): Payer: PPO | Admitting: Psychiatry

## 2016-04-06 ENCOUNTER — Encounter (HOSPITAL_COMMUNITY): Payer: Self-pay | Admitting: Psychiatry

## 2016-04-06 VITALS — BP 126/74 | HR 75 | Ht 63.0 in | Wt 211.2 lb

## 2016-04-06 DIAGNOSIS — F200 Paranoid schizophrenia: Secondary | ICD-10-CM | POA: Diagnosis not present

## 2016-04-06 MED ORDER — BUPROPION HCL ER (XL) 300 MG PO TB24: 300.0000 mg | ORAL_TABLET | Freq: Every morning | ORAL | Status: DC

## 2016-04-06 MED ORDER — HALOPERIDOL 1 MG PO TABS: ORAL_TABLET | ORAL | Status: DC

## 2016-04-06 NOTE — Progress Notes (Signed)
[facility] Progress Note  Shelby HAJJAR [id number] 75 y.o.  04/06/2016 2:40 PM  Chief Complaint: Medication management and follow-up.    History of Present Illness: [name] came for her followup appointment.  She recently had back surgery and she is recovering from it.  She is feeling much better.  She has less pain.  She admitted her sleep is improved from the past.  She is taking Haldol which is helping her psychosis, paranoia and hallucination.  Her appetite is okay.  She is social and active but sometime limited to go places because of chronic back pain.  She likes Wellbutrin and Haldol, in addition because she is feeling less depressed and less anxious.  She has no tremors, shakes or any EPS.  She is not involved in any agitation, aggressive behavior.  She continues to have a good relationship with the daughter comes every day to check on her.  She had some memory impairment and some time forgetful but it is chronic.  Patient denies drinking or using any illegal substances.  Her appetite is okay.  Her vital signs are stable.  She had a home health aid who comes regularly and assist to organize her pill box.  Suicidal Ideation: No Plan Formed: No Patient has means to carry out plan: No  Homicidal Ideation: No Plan Formed: No Patient has means to carry out plan: No  Review of Systems  Respiratory: Negative.   Musculoskeletal: Positive for back pain and joint pain.  Skin: Negative.   Neurological: Negative for tremors.  Psychiatric/Behavioral: Negative for suicidal ideas.    Psychiatric: Agitation: No Hallucination: No Depressed Mood: No Insomnia: No Hypersomnia: No Altered Concentration: No Feels Worthless: No Grandiose Ideas: No Belief In Special Powers: No New/Increased Substance Abuse: No Compulsions: No  Neurologic: Headache: No Seizure: No Paresthesias: No  Past Medical Family, Social History:  Patient was born and raised in [place]. She had 2 sons and one daughter.  Both her sons are in jail. Patient had another son who committed suicide in 26-Apr-2012 .  Patient husband died many years ago.  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  His brother died in a mental health hospital in [place].  Patient has multiple health problems.  She has GERD, gout, hypertension, degenerative joint disease, osteoarthritis, TIA and bilateral hearing loss.  She has history of mitral valve prolapse, chronic venous insufficiency, diverticulitis hyperlipidemia obesity and seasonal allergies. Patient sees [facility] for her medical needs.    Outpatient Encounter Prescriptions as of 04/06/2016  Medication Sig  . albuterol (PROVENTIL HFA;VENTOLIN HFA) 108 (90 BASE) MCG/ACT inhaler Inhale 2 puffs into the lungs every 6 (six) hours as needed for wheezing or shortness of breath.  . allopurinol (ZYLOPRIM) 300 MG tablet TAKE 1 TABLET BY MOUTH DAILY  . amLODipine (NORVASC) 10 MG tablet TAKE 1 TABLET BY MOUTH EVERY DAY  . ascorbic acid (VITAMIN C) 500 MG tablet Take 500 mg by mouth daily.  [name] aspirin EC 81 MG tablet Take 81 mg by mouth daily.  [name] atorvastatin (LIPITOR) 40 MG tablet TAKE 1 TABLET BY MOUTH EVERY DAY  . buPROPion (WELLBUTRIN XL) 300 MG 24 hr tablet Take 1 tablet (300 mg total) by mouth every morning.  . Calcium Carbonate-Vitamin D (CALCIUM-VITAMIN D) 500-200 MG-UNIT per tablet Take 1 tablet by mouth daily.    [name]  diphenhydrAMINE (BENADRYL) 25 MG tablet Take 25 mg by mouth every 6 (six) hours as needed.  . docusate sodium (COLACE) 100 MG capsule Take 1 capsule (100 mg total) by mouth every 12 (twelve) hours.  . fluocinonide cream (LIDEX) 0.05 % APPLY TO AFFECTED AREA TWICE DAILY  . fluticasone (FLONASE) 50 MCG/ACT nasal spray INSTILL 2 SPRAYS IN EACH NOSTRIL DAILY  . furosemide (LASIX) 20 MG tablet TAKE ONE TABLET BY MOUTH  DAILY. AY TAKE AN EXTRA ONE AS NEEDED ONCE DAILY IF SWELLING OR EDEMA IS BAD  . haloperidol (HALDOL) 1 MG tablet TAKE ONE TABLET BY MOUTH DAILY AT BEDTIME.  [name] HYDROcodone-acetaminophen (NORCO/VICODIN) 5-325 MG tablet Take 1 tablet by mouth every 6 (six) hours as needed (mild pain).  [name] losartan (COZAAR) 100 MG tablet TAKE ONE TABLET BY MOUTH DAILY  . Multiple Vitamins-Minerals (ALIVE WOMENS ENERGY) TABS Take by mouth.  . nystatin (MYCOSTATIN) powder APPLY TO AFFECTED AREA TWICE DAILY  . OVER THE COUNTER MEDICATION calciboost  . oxybutynin (DITROPAN-XL) 5 MG 24 hr tablet TAKE ONE TABLET BY MOUTH AT BEDTIME  . pantoprazole (PROTONIX) 40 MG tablet TAKE 1 TABLET BY MOUTH TWICE DAILY BEFORE A MEAL  . potassium chloride (MICRO-K) 10 MEQ CR capsule TAKE 1 CAPSULE BY MOUTH DAILY  . senna (SENOKOT) 8.6 MG TABS tablet Take 1 tablet by mouth every other day.  . [DISCONTINUED] buPROPion (WELLBUTRIN XL) 300 MG 24 hr tablet Take 1 tablet (300 mg total) by mouth every morning.  . [DISCONTINUED] haloperidol (HALDOL) 1 MG tablet TAKE ONE TABLET BY MOUTH DAILY AT BEDTIME.   No facility-administered encounter medications on file as of 04/06/2016.    Past Psychiatric History/Hospitalization(s): Patient admitted history of one psychiatric hospitalization many years ago. Patient remember she was trying to jump off a window when her husband saved her. She was admitted at [facility] for 3 weeks.  She was seen by this writer in 2013 and given Haldol which helped her but patient stopped taking the medication because she was feeling better.   Anxiety: Yes Bipolar Disorder: No Depression: Yes Mania: No Psychosis: Yes Schizophrenia: Yes Personality Disorder: No Hospitalization for psychiatric illness: Yes History of Electroconvulsive Shock Therapy: No Prior Suicide Attempts: Patient tried to jump off from the window but her husband rescued her.  Physical Exam: Constitutional:  BP 126/74 mmHg  Pulse 75  Ht _0   (1.6 m)  Wt 211 lb 3.2 oz (95.8 kg)  BMI 37.42 kg/m2  Recent Results (from the past 2160 hour(s))  APTT     Status: None   Collection Time: 01/25/16  2:02 PM  Result Value Ref Range   aPTT 27 24 - 37 seconds  CBC     Status: Abnormal   Collection Time: 01/25/16  2:02 PM  Result Value Ref Range   WBC 7.6 4.0 - 10.5 K/uL   RBC 3.79 (L) 3.87 - 5.11 MIL/uL   Hemoglobin 12.1 12.0 - 15.0 g/dL   HCT 36.3 36.0 - 46.0 %   MCV 95.8 78.0 - 100.0 fL   MCH 31.9 26.0 - 34.0 pg   MCHC 33.3 30.0 - 36.0 g/dL   RDW 15.8 (H) 11.5 - 15.5 %   Platelets 216 150 - 400 K/uL  Comprehensive metabolic panel     Status: Abnormal   Collection Time: 01/25/16  2:02 PM  Result Value Ref Range   Sodium 143 135 - 145 mmol/L   Potassium 4.1 3.5 - 5.1 mmol/L   Chloride 110 101 -  111 mmol/L   CO2 25 22 - 32 mmol/L   Glucose, Bld 99 65 - 99 mg/dL   BUN 13 6 - 20 mg/dL   Creatinine, Ser 1.53 (H) 0.44 - 1.00 mg/dL   Calcium 9.6 8.9 - 10.3 mg/dL   Total Protein 7.3 6.5 - 8.1 g/dL   Albumin 3.7 3.5 - 5.0 g/dL   AST 30 15 - 41 U/L   ALT 32 14 - 54 U/L   Alkaline Phosphatase 115 38 - 126 U/L   Total Bilirubin 0.4 0.3 - 1.2 mg/dL   GFR calc non Af Amer 32 (L) >60 mL/min   GFR calc Af Amer 38 (L) >60 mL/min    Comment: (NOTE) The eGFR has been calculated using the CKD EPI equation. This calculation has not been validated in all clinical situations. eGFR's persistently <60 mL/min signify possible Chronic Kidney Disease.    Anion gap 8 5 - 15  Protime-INR     Status: None   Collection Time: 01/25/16  2:02 PM  Result Value Ref Range   Prothrombin Time 14.3 11.6 - 15.2 seconds   INR 1.09 0.00 - 1.49  Surgical pcr screen     Status: Abnormal   Collection Time: 01/25/16  2:02 PM  Result Value Ref Range   MRSA, PCR NEGATIVE NEGATIVE   Staphylococcus aureus POSITIVE (A) NEGATIVE    Comment:        The Xpert SA Assay (FDA approved for NASAL specimens in patients over 15 years of age), is one component of a  comprehensive surveillance program.  Test performance has been validated by [facility] for patients greater than or equal to 38 year old. It is not intended to diagnose infection nor to guide or monitor treatment.   CBC     Status: Abnormal   Collection Time: 02/03/16  6:01 AM  Result Value Ref Range   WBC 6.1 4.0 - 10.5 K/uL   RBC 3.10 (L) 3.87 - 5.11 MIL/uL   Hemoglobin 9.8 (L) 12.0 - 15.0 g/dL   HCT 29.9 (L) 36.0 - 46.0 %   MCV 96.5 78.0 - 100.0 fL   MCH 31.6 26.0 - 34.0 pg   MCHC 32.8 30.0 - 36.0 g/dL   RDW 16.2 (H) 11.5 - 15.5 %   Platelets 160 150 - 400 K/uL  Basic Metabolic Panel     Status: Abnormal   Collection Time: 02/03/16  6:01 AM  Result Value Ref Range   Sodium 142 135 - 145 mmol/L   Potassium 4.0 3.5 - 5.1 mmol/L   Chloride 108 101 - 111 mmol/L   CO2 22 22 - 32 mmol/L   Glucose, Bld 115 (H) 65 - 99 mg/dL   BUN 10 6 - 20 mg/dL   Creatinine, Ser 1.31 (H) 0.44 - 1.00 mg/dL   Calcium 8.5 (L) 8.9 - 10.3 mg/dL   GFR calc non Af Amer 39 (L) >60 mL/min   GFR calc Af Amer 45 (L) >60 mL/min    Comment: (NOTE) The eGFR has been calculated using the CKD EPI equation. This calculation has not been validated in all clinical situations. eGFR's persistently <60 mL/min signify possible Chronic Kidney Disease.    Anion gap 12 5 - 15    General Appearance: obese and Casually dressed.  She has difficulty walking.  Musculoskeletal: Strength & Muscle Tone: Patient uses walker for ambulation.  She has chronic pain. Gait & Station: unsteady she uses stick to help her balance. Patient leans: Front and  Backward  Mental status examination Patient is casually dressed and fairly groomed.  She maintained fair eye contact.  She is cooperative .  She describes her mood euthymic and her affect is appropriate.  Her speech is fast and at times rambling.  Her thought process is circumstantial.  She denies any auditory or visual hallucination.  She denies any paranoia or any delusions.   She is still has some flight of ideas .  Her attention and concentration is distracted.  She denies any active or passive suicidal thoughts or homicidal thoughts.  Her fund of knowledge is below average.  Her cognition is mildly impaired.  She is alert and oriented x3.  Her insight judgment and impulse control is okay.  Established Problem, Stable/Improving (1), Review of Psycho-Social Stressors (1), Review or order clinical lab tests (1), Review of Last Therapy Session (1) and Review of Medication Regimen & Side Effects (2)  Assessment: Axis I:  Psychosis NOS, rule out paranoid schizophrenia, rule out major depressive disorder with psychosis, history of alcohol abuse  Axis II: deferred  Axis III:  see medical history  Plan:   Patient is a stable on low-dose on Wellbutrin.  She does not want to change her medication.  She has no side effects. I will continue Haldol 1 mg at bedtime and Wellbutrin XL 300 mg daily.  Discussed medication side effects and benefits.  At this time patient is not interested in counseling. Recommended to call us back if she has any question or any concern.  Follow-up in 3 months.  Aara Jacquot T., MD 04/06/2016

## 2016-04-11 ENCOUNTER — Other Ambulatory Visit: Payer: Self-pay | Admitting: Internal Medicine

## 2016-04-26 ENCOUNTER — Other Ambulatory Visit: Payer: Self-pay | Admitting: Internal Medicine

## 2016-04-27 NOTE — Telephone Encounter (Signed)
Removed from med list 02/03/2016

## 2016-04-28 NOTE — Telephone Encounter (Signed)
Needs appt. Tramadol was stopped by Dr Ricard Dillon (ortho) 4/6 when he D/C'd her after ortho procedure.

## 2016-05-01 NOTE — Telephone Encounter (Signed)
Please schedule for an appointment due to request for Tramadol if she is needing this. Thanks!

## 2016-05-03 ENCOUNTER — Ambulatory Visit (INDEPENDENT_AMBULATORY_CARE_PROVIDER_SITE_OTHER): Payer: PPO | Admitting: Internal Medicine

## 2016-05-03 ENCOUNTER — Encounter: Payer: Self-pay | Admitting: Internal Medicine

## 2016-05-03 VITALS — BP 126/71 | HR 79 | Temp 98.3°F | Wt 218.1 lb

## 2016-05-03 DIAGNOSIS — M4806 Spinal stenosis, lumbar region: Secondary | ICD-10-CM

## 2016-05-03 DIAGNOSIS — G8929 Other chronic pain: Secondary | ICD-10-CM | POA: Diagnosis not present

## 2016-05-03 DIAGNOSIS — M545 Low back pain, unspecified: Secondary | ICD-10-CM

## 2016-05-03 MED ORDER — TRAMADOL HCL 50 MG PO TABS: 50.0000 mg | ORAL_TABLET | Freq: Four times a day (QID) | ORAL | Status: DC | PRN

## 2016-05-03 NOTE — Assessment & Plan Note (Signed)
Patient has chronic back pain as related to L4-L5 spinal stenosis. She has been seen by Dr. Lorin Mercy and underwent a procedure on April the 4th. Following this procedure she had relief for approximately 6-8 weeks but the pain has now returned. She presents today for a medication refill of tramadol for pain control. Given the increase in her pain to near baseline levels this is concerning and we will have her follow-up with Dr. Lorin Mercy. Additionally, we have only provided a short course of tramadol and discussed that we will not re-prescibe this medication as she will need to be followed by orthopaedics.  -- Made referral to orthopaedics -- Tramadol 50mg  PRN for pain with a maximum of 3 per day. We will give 60 pills without refills and discussed she needs follow-up with orthopaedics

## 2016-05-03 NOTE — Progress Notes (Signed)
   CC: Medication refill HPI: [name] is a 75 y.o. female with a h/o of D CHF, HTN, OSA, CKD Stage III, Opioid Dependence and Schizophrenia who presents for a medication refill for chronic back pain. She is followed by Dr. Lorin Mercy for L4-L5 lumbar stenosis and recently had an operation for this on April th 4th. She states the pain improved for 6-8 weeks following surgery but has now worsened and is back at baseline. She describes the pain as a sharp stabbing pain located over her right hip and groin. The pain does not radiate down her leg or up her back. She denies any numbness, tingling, weakness and paraesthesias. She denies fevers, headaches, chills or weight loss. She does endorse some urinary incontinence but states this has been present for several years and has not changed since before or after her procedure. She also complains of mild shortness of breath which she states is chronic and stable. She denies chest pain, abdominal pain, n/v or problems with micturition. She has no additional acute complaints at today's visit.   Past Medical History  Diagnosis Date  . History of cervical cancer 1972  . Ovarian cancer ([place]) 1972    S/P oophorectomy  . GERD (gastroesophageal reflux disease)   . Gout   . Hypertension   . DJD (degenerative joint disease), multiple sites     Low back pain worst  . DDD (degenerative disc disease), lumbosacral   . Osteoarthritis of hip     bilateral hips  . Hx-TIA (transient ischemic attack) 05/13/2001    Right facial numbness  . Sensorineural hearing loss of both ears   . Mitral valve prolapse 10/27/1999    Subsequent 2D echo show normal mitral valve  . Chronic venous insufficiency   . Colon, diverticulosis Sept. 2011    outpatient colonoscopy by Dr. Cristina Gong.  Need record  . Hyperlipidemia   . Recurrent boils     History of MRSA skin infections with abscess  . Gout     Uric Acid level 4.2 on 300 allopurinol  . Depression   . Psychosis      Review of Systems: A complete ROS was negative except as per HPI.  Physical Exam: Filed Vitals:   05/03/16 1316  BP: 126/71  Pulse: 79  Temp: 98.3 F (36.8 C)  TempSrc: Oral  Weight: 218 lb 1.6 oz (98.93 kg)  SpO2: 98%   Head: Normocephalic, without obvious abnormality, atraumatic Back: Miled tenderness to palpation over the lumbar area Lungs: clear to auscultation bilaterally Heart: regular rate and rhythm, S1, S2 normal, no murmur, click, rub or gallop Abdomen: soft, non-tender; bowel sounds normal; no masses,  no organomegaly Extremities: Mild pedal edma bilaterally  Neuro: 5/5 muscle strength in her lower extremities bilaterally, normal sensation and reflexes in her lower extremities bilaterally   Assessment & Plan:  See encounters tab for problem based medical decision making. Patient seen with Dr. Dareen Piano  Signed: Ophelia Shoulder, MD 05/03/2016, 1:20 PM  Pager: [phone]

## 2016-05-03 NOTE — Patient Instructions (Signed)
It was a pleasure seeing you today. Thank you for choosing [facility] for your healthcare needs.  -- Today we discussed your recurrent back pain in relation to spinal stenosis -- Please follow-up with Dr. Lorin Mercy -- We will provide Tramadol 60 pills for pain

## 2016-05-04 ENCOUNTER — Other Ambulatory Visit: Payer: Self-pay

## 2016-05-04 NOTE — Telephone Encounter (Signed)
Requesting tramadol to be filled @ [facility].

## 2016-05-04 NOTE — Progress Notes (Signed)
Internal Medicine Clinic Attending  I saw and evaluated the patient.  I personally confirmed the key portions of the history and exam documented by Dr. Taylor and I reviewed pertinent patient test results.  The assessment, diagnosis, and plan were formulated together and I agree with the documentation in the resident's note.  

## 2016-05-04 NOTE — Telephone Encounter (Signed)
Tried calling both contact numbers with no answer and no VM. Per Dr. Lynnae January this medication will not be refilled until after being seen in clinic. Medication was d/c by ortho.

## 2016-05-05 NOTE — Telephone Encounter (Signed)
LVM to return call.

## 2016-05-05 NOTE — Telephone Encounter (Signed)
Called to [facility] per dr Lovena Le

## 2016-05-11 ENCOUNTER — Other Ambulatory Visit: Payer: Self-pay | Admitting: Internal Medicine

## 2016-05-11 DIAGNOSIS — Z1231 Encounter for screening mammogram for malignant neoplasm of breast: Secondary | ICD-10-CM

## 2016-05-24 ENCOUNTER — Ambulatory Visit
Admission: RE | Admit: 2016-05-24 | Discharge: 2016-05-24 | Disposition: A | Discharge status: Home / Self Care | Payer: PPO | Source: Ambulatory Visit | Attending: Internal Medicine | Admitting: Internal Medicine

## 2016-05-24 DIAGNOSIS — Z1231 Encounter for screening mammogram for malignant neoplasm of breast: Secondary | ICD-10-CM

## 2016-05-25 ENCOUNTER — Other Ambulatory Visit: Payer: Self-pay | Admitting: Internal Medicine

## 2016-05-25 ENCOUNTER — Other Ambulatory Visit (HOSPITAL_COMMUNITY): Payer: Self-pay | Admitting: Psychiatry

## 2016-05-25 DIAGNOSIS — M545 Low back pain, unspecified: Secondary | ICD-10-CM

## 2016-05-25 DIAGNOSIS — F200 Paranoid schizophrenia: Secondary | ICD-10-CM

## 2016-05-25 NOTE — Telephone Encounter (Signed)
The steroid cream is high potency and she should not be on long term. If she feels she neds, will need to see derm.  No to tramadol. She saw [facility] recently and told needs to return to ortho.  Got one yr refill on cozaar and lasix on 5/17.

## 2016-05-26 ENCOUNTER — Other Ambulatory Visit: Payer: Self-pay | Admitting: Internal Medicine

## 2016-05-26 DIAGNOSIS — R928 Other abnormal and inconclusive findings on diagnostic imaging of breast: Secondary | ICD-10-CM

## 2016-05-29 ENCOUNTER — Other Ambulatory Visit: Payer: Self-pay | Admitting: Internal Medicine

## 2016-06-09 ENCOUNTER — Other Ambulatory Visit: Payer: Self-pay

## 2016-06-12 ENCOUNTER — Other Ambulatory Visit: Payer: Self-pay

## 2016-06-12 ENCOUNTER — Other Ambulatory Visit: Payer: Self-pay | Admitting: Internal Medicine

## 2016-06-12 DIAGNOSIS — R928 Other abnormal and inconclusive findings on diagnostic imaging of breast: Secondary | ICD-10-CM

## 2016-06-13 ENCOUNTER — Ambulatory Visit
Admission: RE | Admit: 2016-06-13 | Discharge: 2016-06-13 | Disposition: A | Discharge status: Home / Self Care | Payer: PPO | Source: Ambulatory Visit | Attending: Internal Medicine | Admitting: Internal Medicine

## 2016-06-13 DIAGNOSIS — N63 Unspecified lump in breast: Secondary | ICD-10-CM | POA: Diagnosis not present

## 2016-06-13 DIAGNOSIS — R928 Other abnormal and inconclusive findings on diagnostic imaging of breast: Secondary | ICD-10-CM

## 2016-06-26 ENCOUNTER — Other Ambulatory Visit: Payer: Self-pay | Admitting: Internal Medicine

## 2016-06-26 ENCOUNTER — Other Ambulatory Visit (HOSPITAL_COMMUNITY): Payer: Self-pay | Admitting: Psychiatry

## 2016-06-26 DIAGNOSIS — M545 Low back pain, unspecified: Secondary | ICD-10-CM

## 2016-06-26 DIAGNOSIS — F200 Paranoid schizophrenia: Secondary | ICD-10-CM

## 2016-06-27 NOTE — Telephone Encounter (Signed)
The steroid cream is high potency and she should not be on long term. If she feels she neds, will need to see derm.  No to tramadol. She saw [facility] recently and told needs to return to ortho. I will check my box this PM to see if I have notes from ortho.  No to ditropan - just Rx'd one year in July.

## 2016-06-29 ENCOUNTER — Ambulatory Visit: Payer: Self-pay | Admitting: Internal Medicine

## 2016-07-04 ENCOUNTER — Telehealth (HOSPITAL_COMMUNITY): Payer: Self-pay

## 2016-07-04 ENCOUNTER — Other Ambulatory Visit (HOSPITAL_COMMUNITY): Payer: Self-pay | Admitting: Psychiatry

## 2016-07-04 DIAGNOSIS — F200 Paranoid schizophrenia: Secondary | ICD-10-CM

## 2016-07-04 NOTE — Telephone Encounter (Signed)
Medication refills - Telephone message left for patient new orders for her Haldol and Wellbutrin XL were e-scribed into her [facility] this date as she had requested. Reminded of appt. now set for 08/22/16.

## 2016-07-28 ENCOUNTER — Other Ambulatory Visit: Payer: Self-pay | Admitting: Internal Medicine

## 2016-07-28 DIAGNOSIS — M545 Low back pain, unspecified: Secondary | ICD-10-CM

## 2016-07-31 NOTE — Telephone Encounter (Signed)
The steroid cream is high potency and she should not be on long term. If she feels she neds, will need to see derm.  No to tramadol. She saw [facility] recently and told needs to return to ortho. I will check my box this PM to see if I have notes from ortho.  No to ditropan - just Rx'd one year in July.

## 2016-08-03 ENCOUNTER — Encounter: Payer: Self-pay | Admitting: Internal Medicine

## 2016-08-03 ENCOUNTER — Ambulatory Visit (INDEPENDENT_AMBULATORY_CARE_PROVIDER_SITE_OTHER): Payer: PPO | Admitting: Internal Medicine

## 2016-08-03 VITALS — BP 150/79 | HR 75 | Temp 98.2°F | Wt 213.0 lb

## 2016-08-03 DIAGNOSIS — Z7289 Other problems related to lifestyle: Secondary | ICD-10-CM

## 2016-08-03 DIAGNOSIS — I5032 Chronic diastolic (congestive) heart failure: Secondary | ICD-10-CM | POA: Diagnosis not present

## 2016-08-03 DIAGNOSIS — M545 Low back pain, unspecified: Secondary | ICD-10-CM

## 2016-08-03 DIAGNOSIS — F1120 Opioid dependence, uncomplicated: Secondary | ICD-10-CM

## 2016-08-03 DIAGNOSIS — N183 Chronic kidney disease, stage 3 unspecified: Secondary | ICD-10-CM

## 2016-08-03 DIAGNOSIS — D649 Anemia, unspecified: Secondary | ICD-10-CM | POA: Diagnosis not present

## 2016-08-03 DIAGNOSIS — Z833 Family history of diabetes mellitus: Secondary | ICD-10-CM | POA: Diagnosis not present

## 2016-08-03 DIAGNOSIS — I10 Essential (primary) hypertension: Secondary | ICD-10-CM

## 2016-08-03 DIAGNOSIS — Z8349 Family history of other endocrine, nutritional and metabolic diseases: Secondary | ICD-10-CM

## 2016-08-03 DIAGNOSIS — F209 Schizophrenia, unspecified: Secondary | ICD-10-CM

## 2016-08-03 DIAGNOSIS — M109 Gout, unspecified: Secondary | ICD-10-CM

## 2016-08-03 DIAGNOSIS — Z6837 Body mass index (BMI) 37.0-37.9, adult: Secondary | ICD-10-CM

## 2016-08-03 DIAGNOSIS — Z87891 Personal history of nicotine dependence: Secondary | ICD-10-CM

## 2016-08-03 DIAGNOSIS — I130 Hypertensive heart and chronic kidney disease with heart failure and stage 1 through stage 4 chronic kidney disease, or unspecified chronic kidney disease: Secondary | ICD-10-CM

## 2016-08-03 DIAGNOSIS — Z23 Encounter for immunization: Secondary | ICD-10-CM

## 2016-08-03 DIAGNOSIS — IMO0001 Reserved for inherently not codable concepts without codable children: Secondary | ICD-10-CM

## 2016-08-03 DIAGNOSIS — E785 Hyperlipidemia, unspecified: Secondary | ICD-10-CM | POA: Diagnosis not present

## 2016-08-03 DIAGNOSIS — R7309 Other abnormal glucose: Secondary | ICD-10-CM

## 2016-08-03 DIAGNOSIS — Z79899 Other long term (current) drug therapy: Secondary | ICD-10-CM

## 2016-08-03 DIAGNOSIS — E669 Obesity, unspecified: Secondary | ICD-10-CM

## 2016-08-03 DIAGNOSIS — K219 Gastro-esophageal reflux disease without esophagitis: Secondary | ICD-10-CM

## 2016-08-03 LAB — POCT GLYCOSYLATED HEMOGLOBIN (HGB A1C): Hemoglobin A1C: 5.7

## 2016-08-03 LAB — GLUCOSE, CAPILLARY: Glucose-Capillary: 98 mg/dL (ref 65–99)

## 2016-08-03 MED ORDER — TRAMADOL HCL 50 MG PO TABS: 50.0000 mg | ORAL_TABLET | Freq: Two times a day (BID) | ORAL | 0 refills | Status: DC | PRN

## 2016-08-03 NOTE — Patient Instructions (Signed)
1. See me Oct 19th 2. Take the tramadol only when the pain prevents you from doing activities

## 2016-08-04 ENCOUNTER — Encounter: Payer: Self-pay | Admitting: Internal Medicine

## 2016-08-04 LAB — BMP8+ANION GAP
Anion Gap: 19 mmol/L — ABNORMAL HIGH (ref 10.0–18.0)
BUN/Creatinine Ratio: 9 — ABNORMAL LOW (ref 12–28)
BUN: 13 mg/dL (ref 8–27)
CO2: 21 mmol/L (ref 18–29)
Calcium: 9.2 mg/dL (ref 8.7–10.3)
Chloride: 105 mmol/L (ref 96–106)
Creatinine, Ser: 1.39 mg/dL — ABNORMAL HIGH (ref 0.57–1.00)
GFR calc Af Amer: 43 mL/min/{1.73_m2} — ABNORMAL LOW (ref >59)
GFR calc non Af Amer: 37 mL/min/{1.73_m2} — ABNORMAL LOW (ref >59)
Glucose: 97 mg/dL (ref 65–99)
Potassium: 4.3 mmol/L (ref 3.5–5.2)
Sodium: 145 mmol/L — ABNORMAL HIGH (ref 134–144)

## 2016-08-04 LAB — CBC
Hematocrit: 35.2 % (ref 34.0–46.6)
Hemoglobin: 11.9 g/dL (ref 11.1–15.9)
MCH: 32.1 pg (ref 26.6–33.0)
MCHC: 33.8 g/dL (ref 31.5–35.7)
MCV: 95 fL (ref 79–97)
Platelets: 216 10*3/uL (ref 150–379)
RBC: 3.71 x10E6/uL — ABNORMAL LOW (ref 3.77–5.28)
RDW: 15.2 % (ref 12.3–15.4)
WBC: 6.7 10*3/uL (ref 3.4–10.8)

## 2016-08-04 LAB — HEPATITIS C ANTIBODY: Hep C Virus Ab: <0.1 s/co ratio (ref 0.0–0.9)

## 2016-08-04 NOTE — Assessment & Plan Note (Addendum)
This took a lot of time. I inheritied her on tramadol which I refilled until her surgery with Dr Lorin Mercy in April. Review of notes from surgery indicate she had a L4-5 Decompression by Dr Ricard Dillon in April. I had denied some refills after that bc he was Rxing (rather Dr Ricard Dillon was) opioids. She was seen by [facility] in July and Rx'd 60 with instructions to go back to Dr Lorin Mercy since her back pain had returned after surgery. She did not do that. She initially had three months of "bliss" after the procedure than her pain returned. Hasn't seen Lorin Mercy since May or June. She has been taking Advil 2 in AM and 2 in PM. Has a few tramadol left but not taking them. The pain keeps her stooped over and not able to stand up straight to walk. Walking and standing straight is her goal. She doesn't do much at all. May go down steps into garage to do her laundry. Daughter is concerned bc she has been on the tramadol for yrs and now we are not refilling it. I explained that I will not resume it chronically until I get Lorin Mercy' notes and he states that he is "releasing" her / nothing more to be done surgically.   PLAN : tramadol refill once She needs to see Lorin Mercy Will need further eval and education if to cont tramadol If I am to cont to Rx tramadol, she will need to see me more than once a yr

## 2016-08-04 NOTE — Assessment & Plan Note (Signed)
She c/o fat around ABD. She prefers to only eat cookies, cakes, sweets, etc. She prefers the taste of sweets and describes cravings for the, She does not like to cook and doesn't like meals on wheels. She lives alone. I explained that her BMP does put her in obese category and at risk for DM.  PLAN : A1C (nl) Refer to [facility] for nutrition

## 2016-08-04 NOTE — Assessment & Plan Note (Signed)
BP today 150/79. BP in July nl. On Amlodipine 10, lasix 20, losartan 100. Due to time restraints I did not recheck it.  PLAN : RTC later in month and will recheck BP Cont current meds

## 2016-08-04 NOTE — Assessment & Plan Note (Signed)
She is on Haldol and Wellbutrin, both prescribed by [facility]. Last seen in June.

## 2016-08-04 NOTE — Assessment & Plan Note (Signed)
Her HgB had been stable at 12's but most recent was low at 10. Her ferritin has been as low as 20 in 2011 but has increased to 23 most recently. This is lokely a combo of some prior iron def and renal insuff.  PLAN : check HgB

## 2016-08-04 NOTE — Assessment & Plan Note (Signed)
She is on Protonix 40 BID. She denies any GI sxs.  PLAN:  Cont current meds

## 2016-08-04 NOTE — Assessment & Plan Note (Signed)
GFR bounces around but generally stable at 40. Manage RF. Has been taking NSAID's for back pain.  PLAN : check BMP

## 2016-08-04 NOTE — Progress Notes (Signed)
   Subjective:    Patient ID: [name], female    DOB: 20-Aug-1941, 75 y.o.   MRN: [id number]  HPI  [name] is here for pain F/U. Please see the A&P for the status of the pt's chronic medical problems.  ROS : per ROS section and in problem oriented charting. All other systems are negative.  PMHx, Soc hx, and / or Fam hx : Daughter has pre-diabetes. Both pt's mother and father had DM starting age 55. Pt lives in her own home. She has schizophrenia and is seen by mental health.  Review of Systems  Constitutional: Negative for appetite change and unexpected weight change.  Cardiovascular: Positive for leg swelling.  Gastrointestinal: Negative for abdominal distention and abdominal pain.  Musculoskeletal: Positive for arthralgias, back pain, gait problem and myalgias.  Skin: Positive for color change.  Psychiatric/Behavioral: Positive for confusion.       Objective:   Physical Exam  Constitutional: She appears well-developed and well-nourished. No distress.  HENT:  Head: Normocephalic and atraumatic.  Right Ear: External ear normal.  Left Ear: External ear normal.  Nose: Nose normal.  Eyes: Conjunctivae and EOM are normal.  Cardiovascular: Normal rate, regular rhythm and normal heart sounds.   No murmur heard. Pulmonary/Chest: Effort normal and breath sounds normal.  Musculoskeletal: Normal range of motion. She exhibits edema. She exhibits no tenderness.  Skin: Skin is warm and dry. No rash noted. She is not diaphoretic. No erythema. No pallor.  Psychiatric: Thought content normal. Her mood appears anxious. Her speech is tangential. Her speech is not rapid and/or pressured, not delayed and not slurred. She expresses impulsivity. She is communicative.  Easily confused, hard to get medical detail          Assessment & Plan:

## 2016-08-04 NOTE — Assessment & Plan Note (Signed)
Still taking her allopurinol. No gout flares.   PLAN:  Cont current meds

## 2016-08-04 NOTE — Assessment & Plan Note (Signed)
She is on Lipitor 40 for primary prevention. This is high intensity. No SE.  PLAN:  Cont current meds

## 2016-08-04 NOTE — Assessment & Plan Note (Signed)
Last ECHO in 2011 with nl EF and grade 2 DDysfsn. She c/o LE edema - intermittent for 6 months, now worse and persistent since 2 months. B feet. No edema elsewhere. On exam, she has pitting edema +1 to knees B. Her feet are puffy and expand where her shoes do not enclose. No pitting edema elsewhere. No cardiopul sxs. Exam shows nl cardiac and pul exam.  Diff dx : CVI, worsening CO, Nephropathy / proteinuria. Her liver synthetic fxn always nl.  PLAN : ECHO Urine protein  / cr ratio

## 2016-08-08 LAB — PROTEIN / CREATININE RATIO, URINE
Creatinine, Urine: 178.1 mg/dL
Protein, Ur: 25.2 mg/dL
Protein/Creat Ratio: 141 mg/g creat (ref 0–200)

## 2016-08-16 NOTE — Addendum Note (Signed)
Addended by: Yvonna Alanis E on: 08/16/2016 04:00 PM   Modules accepted: Orders

## 2016-08-17 ENCOUNTER — Encounter: Payer: Self-pay | Admitting: Internal Medicine

## 2016-08-21 ENCOUNTER — Ambulatory Visit (HOSPITAL_COMMUNITY): Payer: Self-pay | Admitting: Psychiatry

## 2016-08-22 ENCOUNTER — Encounter (HOSPITAL_COMMUNITY): Payer: Self-pay | Admitting: Psychiatry

## 2016-08-22 ENCOUNTER — Ambulatory Visit (INDEPENDENT_AMBULATORY_CARE_PROVIDER_SITE_OTHER): Payer: PPO | Admitting: Psychiatry

## 2016-08-22 DIAGNOSIS — Z79899 Other long term (current) drug therapy: Secondary | ICD-10-CM | POA: Diagnosis not present

## 2016-08-22 DIAGNOSIS — F200 Paranoid schizophrenia: Secondary | ICD-10-CM | POA: Diagnosis not present

## 2016-08-22 MED ORDER — BUPROPION HCL ER (XL) 300 MG PO TB24: 300.0000 mg | ORAL_TABLET | Freq: Every morning | ORAL | 2 refills | Status: DC

## 2016-08-22 MED ORDER — HALOPERIDOL 1 MG PO TABS: 1.0000 mg | ORAL_TABLET | Freq: Every day | ORAL | 2 refills | Status: DC

## 2016-08-22 NOTE — Progress Notes (Signed)
[facility] Progress Note  [name] [id number] 75 y.o.  08/22/2016 3:40 PM  Chief Complaint: Medication management and follow-up.    History of Present Illness: [name] came late for her appointment.  She uses [employer] and today her bus was late.  She was upset but overall she is doing very well.  She is taking Haldol and Wellbutrin which is helping her paranoia and psychosis.  Last week she tends 89 and she is happy about her birthday.  Her daughter came to visit her.  She sleeping good.  She denies any irritability, paranoia, hallucination or any disorganized thinking.  Her sleep is good with the Haldol.  She has no tremors, shakes or any EPS.  She has chronic back pain and she is limited with her ADLs.  She has home health aid who comes 3 times a week and her daughter also comes every day to check on her.  Patient like to continue her current psychiatric medication.  She recently seen her primary care physician.  Her hemoglobin A1c is 5.7.  She lost weight from the past since she started African Mango diet.  Suicidal Ideation: No Plan Formed: No Patient has means to carry out plan: No  Homicidal Ideation: No Plan Formed: No Patient has means to carry out plan: No  Review of Systems  Respiratory: Negative.   Musculoskeletal: Positive for back pain and joint pain.  Skin: Negative.   Neurological: Negative for tremors.  Psychiatric/Behavioral: Negative for suicidal ideas.    Psychiatric: Agitation: No Hallucination: No Depressed Mood: No Insomnia: No Hypersomnia: No Altered Concentration: No Feels Worthless: No Grandiose Ideas: No Belief In Special Powers: No New/Increased Substance Abuse: No Compulsions: No  Neurologic: Headache: No Seizure: No Paresthesias: No  Past Medical Family, Social History:  Patient was born and raised in [place]. She had 2 sons and one daughter.  Both her sons are in jail. Patient had another son who  committed suicide in 2012-05-27 .  Patient husband died many years ago.  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  His brother died in a mental health hospital in [place].  Patient has multiple health problems.  She has GERD, gout, hypertension, degenerative joint disease, osteoarthritis, TIA and bilateral hearing loss.  She has history of mitral valve prolapse, chronic venous insufficiency, diverticulitis hyperlipidemia obesity and seasonal allergies. [facility] for her medical needs.    Outpatient Encounter Prescriptions as of 04/06/2016  Medication Sig  . albuterol (PROVENTIL HFA;VENTOLIN HFA) 108 (90 BASE) MCG/ACT inhaler Inhale 2 puffs into the lungs every 6 (six) hours as needed for wheezing or shortness of breath.  . allopurinol (ZYLOPRIM) 300 MG tablet TAKE 1 TABLET BY MOUTH DAILY  . amLODipine (NORVASC) 10 MG tablet TAKE 1 TABLET BY MOUTH EVERY DAY  . ascorbic acid (VITAMIN C) 500 MG tablet Take 500 mg by mouth daily.  [name] aspirin EC 81 MG tablet Take 81 mg by mouth daily.  [name] atorvastatin (LIPITOR) 40 MG tablet TAKE 1 TABLET BY MOUTH EVERY DAY  . buPROPion (WELLBUTRIN XL) 300 MG 24 hr tablet Take 1 tablet (300 mg total) by mouth every morning.  . Calcium Carbonate-Vitamin D (CALCIUM-VITAMIN D) 500-200 MG-UNIT per tablet Take 1 tablet by mouth daily.    . diphenhydrAMINE (BENADRYL) 25 MG tablet Take 25 mg by mouth every 6 (six) hours  as needed.  . docusate sodium (COLACE) 100 MG capsule Take 1 capsule (100 mg total) by mouth every 12 (twelve) hours.  . fluocinonide cream (LIDEX) 0.05 % APPLY TO AFFECTED AREA TWICE DAILY  . fluticasone (FLONASE) 50 MCG/ACT nasal spray INSTILL 2 SPRAYS IN EACH NOSTRIL DAILY  . furosemide (LASIX) 20 MG tablet TAKE ONE TABLET BY MOUTH DAILY. AY TAKE AN EXTRA ONE AS NEEDED ONCE DAILY IF SWELLING OR EDEMA IS BAD  . haloperidol  (HALDOL) 1 MG tablet TAKE ONE TABLET BY MOUTH DAILY AT BEDTIME.  [name] HYDROcodone-acetaminophen (NORCO/VICODIN) 5-325 MG tablet Take 1 tablet by mouth every 6 (six) hours as needed (mild pain).  [name] losartan (COZAAR) 100 MG tablet TAKE ONE TABLET BY MOUTH DAILY  . Multiple Vitamins-Minerals (ALIVE WOMENS ENERGY) TABS Take by mouth.  . nystatin (MYCOSTATIN) powder APPLY TO AFFECTED AREA TWICE DAILY  . OVER THE COUNTER MEDICATION calciboost  . oxybutynin (DITROPAN-XL) 5 MG 24 hr tablet TAKE ONE TABLET BY MOUTH AT BEDTIME  . pantoprazole (PROTONIX) 40 MG tablet TAKE 1 TABLET BY MOUTH TWICE DAILY BEFORE A MEAL  . potassium chloride (MICRO-K) 10 MEQ CR capsule TAKE 1 CAPSULE BY MOUTH DAILY  . senna (SENOKOT) 8.6 MG TABS tablet Take 1 tablet by mouth every other day.  . [DISCONTINUED] buPROPion (WELLBUTRIN XL) 300 MG 24 hr tablet Take 1 tablet (300 mg total) by mouth every morning.  . [DISCONTINUED] haloperidol (HALDOL) 1 MG tablet TAKE ONE TABLET BY MOUTH DAILY AT BEDTIME.   No facility-administered encounter medications on file as of 04/06/2016.    Past Psychiatric History/Hospitalization(s): Patient admitted history of one psychiatric hospitalization many years ago. Patient remember she was trying to jump off a window when her husband saved her. She was admitted at [facility] for [facility] weeks.  She was seen by this writer in 2013 and given Haldol which helped her but patient stopped taking the medication because she was feeling better.   Anxiety: Yes Bipolar Disorder: No Depression: Yes Mania: No Psychosis: Yes Schizophrenia: Yes Personality Disorder: No Hospitalization for psychiatric illness: Yes History of Electroconvulsive Shock Therapy: No Prior Suicide Attempts: Patient tried to jump off from the window but her husband rescued her.  Physical Exam: Constitutional:  BP 128/72   Pulse 83   Ht 5\' 3"  (1.6 m)   Wt 207 lb 3.2 oz (94 kg)   BMI 36.70 kg/m   Recent Results (from the past 2160  hour(s))  CBC no Diff     Status: Abnormal   Collection Time: 08/03/16 11:58 AM  Result Value Ref Range   WBC 6.7 3.4 - 10.8 x10E3/uL   RBC 3.71 (L) 3.77 - 5.28 x10E6/uL   Hemoglobin 11.9 11.1 - 15.9 g/dL   Hematocrit 35.2 34.0 - 46.6 %   MCV 95 79 - 97 fL   MCH 32.1 26.6 - 33.0 pg   MCHC 33.8 31.5 - 35.7 g/dL   RDW 15.2 12.3 - 15.4 %   Platelets 216 150 - 379 x10E3/uL  BMP8+Anion Gap     Status: Abnormal   Collection Time: 08/03/16 11:58 AM  Result Value Ref Range   Glucose 97 65 - 99 mg/dL   BUN 13 8 - 27 mg/dL   Creatinine, Ser 1.39 (H) 0.57 - 1.00 mg/dL   GFR calc non Af Amer 37 (L) >59 mL/min/1.73   GFR calc Af Amer 43 (L) >59 mL/min/1.73   BUN/Creatinine Ratio 9 (L) 12 - 28   Sodium 145 (H) 134 -  144 mmol/L   Potassium 4.3 3.5 - 5.2 mmol/L   Chloride 105 96 - 106 mmol/L   CO2 21 18 - 29 mmol/L   Anion Gap 19.0 (H) 10.0 - 18.0 mmol/L   Calcium 9.2 8.7 - 10.3 mg/dL  Hepatitis C antibody     Status: None   Collection Time: 08/03/16 11:58 AM  Result Value Ref Range   Hep C Virus Ab <0.1 0.0 - 0.9 s/co ratio    Comment:                                   Negative:     < 0.8                              Indeterminate: 0.8 - 0.9                                   Positive:     > 0.9  The CDC recommends that a positive HCV antibody result  be followed up with a HCV Nucleic Acid Amplification  test [id number]).   Glucose, capillary     Status: None   Collection Time: 08/03/16 12:05 PM  Result Value Ref Range   Glucose-Capillary 98 65 - 99 mg/dL  Protein / creatinine ratio, urine     Status: None   Collection Time: 08/03/16 12:13 PM  Result Value Ref Range   Creatinine, Urine 178.1 Not Estab. mg/dL   Protein, Ur 25.2 Not Estab. mg/dL   Protein/Creat Ratio 141 0 - 200 mg/g creat  POC Hbg A1C     Status: None   Collection Time: 08/03/16 12:16 PM  Result Value Ref Range   Hemoglobin A1C 5.7     General Appearance: obese and Casually dressed.  She has difficulty  walking.  Musculoskeletal: Strength & Muscle Tone: Patient uses walker for ambulation.  She has chronic pain. Gait & Station: unsteady she uses stick to help her balance. Patient leans: Front and Backward  Mental status examination Patient is casually dressed and fairly groomed.  She maintained fair eye contact.  She is cooperative .  She describes her mood euthymic and her affect is appropriate.  Her speech is fast and at times rambling.  Her thought process is circumstantial.  She denies any auditory or visual hallucination.  She denies any paranoia or any delusions.  She is still has some flight of ideas .  Her attention and concentration is distracted.  She denies any active or passive suicidal thoughts or homicidal thoughts.  Her fund of knowledge is below average.  Her cognition is mildly impaired.  She is alert and oriented x3.  Her insight judgment and impulse control is okay.  Established Problem, Stable/Improving (1), Review of Psycho-Social Stressors (1), Review or order clinical lab tests (1), Review of Last Therapy Session (1) and Review of Medication Regimen & Side Effects (2)  Assessment: Axis I:  Psychosis NOS, rule out paranoid schizophrenia, rule out major depressive disorder with psychosis, history of alcohol abuse  Axis II: deferred  Axis III:  see medical history  Plan:   Patient is a stable on low-dose on Wellbutrin.  I reviewed her hemoglobin A1c is 5.7.  She like to continue her current psychiatric medication.  She has no side effects. I will  continue Haldol 1 mg at bedtime and Wellbutrin XL 300 mg daily.  Discussed medication side effects and benefits.  At this time patient is not interested in counseling. Recommended to call us back if she has any question or any concern.  Follow-up in 3 months.  Roselie Cirigliano T., MD 08/22/2016                Patient ID: [name], female   DOB: 12-04-1940, 75 y.o.   MRN: [id number]

## 2016-08-23 ENCOUNTER — Telehealth (INDEPENDENT_AMBULATORY_CARE_PROVIDER_SITE_OTHER): Payer: Self-pay | Admitting: Orthopaedic Surgery

## 2016-08-23 NOTE — Telephone Encounter (Signed)
Patient states that she needs a note stating that you are no longer under Dr. Lorin Mercy and that they can release tramadol to Dr. Lynnae January at the [facility].   Contact Info: [phone]

## 2016-08-23 NOTE — Telephone Encounter (Signed)
Please see attached. OK for note?

## 2016-08-23 NOTE — Telephone Encounter (Signed)
Ok to do thanks. 

## 2016-08-25 ENCOUNTER — Other Ambulatory Visit: Payer: Self-pay | Admitting: Internal Medicine

## 2016-08-25 DIAGNOSIS — M545 Low back pain, unspecified: Secondary | ICD-10-CM

## 2016-08-25 NOTE — Telephone Encounter (Signed)
Sent Dr. Lynnae January a staff message regarding Dr. Lorin Mercy answer.

## 2016-08-28 NOTE — Telephone Encounter (Signed)
I agree with Dr. Heber Logan. Lets have her come to clinic for an appointment.

## 2016-08-28 NOTE — Telephone Encounter (Signed)
Tramadol last filled 10/20

## 2016-08-28 NOTE — Telephone Encounter (Signed)
Last note By Dr Lynnae January 08/03/16 re: Tramadol laid our clear instructions for the patient to do before tramadol would be filled again, this does not appear to have happened so I have refused the refill.  Last note by dr Lynnae January 07/08/15 discussing her rash stated that records were needed from dermatology, this does not appear to have happened.  I would like to have her in for an appointment so records can be obtained and the rash can be visualized before continuing a high potency topical steroid.

## 2016-08-31 ENCOUNTER — Telehealth: Payer: Self-pay | Admitting: *Deleted

## 2016-08-31 NOTE — Telephone Encounter (Signed)
Pt calls and wants to find out why she has appt w/ dr Lorin Mercy, read over chart, ask her to have dr yates fax a release of care to clinic so her pain level can be evaluated and insure only one prescriber of pain med. She is agreeable with this plan, confirmed her appt in jan w/ dr Software engineer

## 2016-09-05 ENCOUNTER — Ambulatory Visit (INDEPENDENT_AMBULATORY_CARE_PROVIDER_SITE_OTHER): Payer: PPO

## 2016-09-05 ENCOUNTER — Encounter (INDEPENDENT_AMBULATORY_CARE_PROVIDER_SITE_OTHER): Payer: Self-pay | Admitting: Orthopaedic Surgery

## 2016-09-05 ENCOUNTER — Encounter (INDEPENDENT_AMBULATORY_CARE_PROVIDER_SITE_OTHER): Payer: Self-pay

## 2016-09-05 ENCOUNTER — Ambulatory Visit (INDEPENDENT_AMBULATORY_CARE_PROVIDER_SITE_OTHER): Payer: PPO | Admitting: Orthopaedic Surgery

## 2016-09-05 VITALS — BP 115/64 | HR 73 | Wt 206.5 lb

## 2016-09-05 DIAGNOSIS — M25551 Pain in right hip: Secondary | ICD-10-CM

## 2016-09-05 DIAGNOSIS — G8929 Other chronic pain: Secondary | ICD-10-CM

## 2016-09-05 DIAGNOSIS — M5442 Lumbago with sciatica, left side: Secondary | ICD-10-CM

## 2016-09-05 DIAGNOSIS — M5441 Lumbago with sciatica, right side: Secondary | ICD-10-CM | POA: Diagnosis not present

## 2016-09-05 NOTE — Progress Notes (Signed)
Office Visit Note   Patient: [name]           Date of Birth: November 03, 1940           MRN: [id number] Visit Date: 09/05/2016 Requested by: Bartholomew Crews, MD [address] [place], [place] [address] PCP: Larey Dresser, MD  Subjective: Chief Complaint  Patient presents with  . Right Arm - Pain  . Right Hip - Pain    75 yo female, patient complaining of R>L hip pain  s/p fall x 4 weeks ago approximately.  Slipped in the tub on baby oil.  She had bruising on arm medially at elbow/forearm.  It is better now, no pain.  She says today she  Is having bilateral hip pain, cannot do straight leg raise right leg.  It hurts her in the right groin area.     States that she fell coming out of the bathtub landing onto her right side. Since fall she has been complaining of pain in her low back along with her right hip. She has been having some also pain in the right groin. This was L4-5 decompression March 2017, cervical fusion 2007, right total hip replacement 2014, and she has also had left total hip replacement.             Review of Systems  Respiratory: Negative.   Gastrointestinal: Negative.   Musculoskeletal: Positive for back pain.  Neurological: Positive for weakness (Right hip flexor).     Assessment & Plan: Visit Diagnoses:  1. Right hip pain   2. Chronic midline low back pain with bilateral sciatica     Plan: Patient seen with Dr. Lorin Mercy. He advised patient to gradually increase her walking distances daily with her home health aid. Reviewed x-rays with him as well. Patient will follow up in office in about 3 months for recheck. Return sooner if needed.  Follow-Up Instructions: Return in about 3 months (around 12/06/2016).   Orders:  Orders Placed This Encounter  Procedures  . XR HIP UNILAT W OR W/O PELVIS 2-3 VIEWS RIGHT  . XR Lumbar Spine 2-3 Views   No orders of the defined types were placed in this encounter.     Procedures: No procedures  performed   Clinical Data: No additional findings.  Objective: Vital Signs: BP 115/64   Pulse 73   Wt 206 lb 8 oz (93.7 kg)   BMI 36.58 kg/m   Physical Exam  Constitutional: She is oriented to person, place, and time. No distress.  HENT:  Head: Normocephalic.  Eyes: EOM are normal. Pupils are equal, round, and reactive to light.  Pulmonary/Chest: No respiratory distress.  Abdominal: She exhibits no distension.  Neurological: She is alert and oriented to person, place, and time.  Skin: Skin is warm.    Ortho Exam Patient and liquids with a cane. Gait is somewhat antalgic. She has lumbar paraspinal tenderness. SHE has good range of motion on the right she has groin pain with internal/external rotation. She has right hip flexor weakness with and without resistance. All other strength maneuvers are good. Specialty Comments:  No specialty comments available.  Imaging: Xr Hip Unilat W Or W/o Pelvis 2-3 Views Right  Result Date: 09/05/2016 X-ray AP pelvis and bilateral hips show good alignment and seating of the components. No acute findings. Impression bilateral hip prostheses with good alignment. No complicating features.  Xr Lumbar Spine 2-3 Views  Result Date: 09/05/2016 Lumbar spine x-rays today did show progression of her lumbar  spondylosis. Most significant finding at L3-S1 with disc space collapse. Impression multilevel lumbar spondylosis. Progressed from last x-ray March 2017.    PMFS History: Patient Active Problem List   Diagnosis Date Noted  . History of lumbar laminectomy for spinal cord decompression 02/02/2016  . Rash and nonspecific skin eruption 07/09/2015  . Bereavement 12/01/2014  . Psychosis 12/01/2014  . Reactive airway disease 08/11/2014  . History of cancer 08/11/2014  . OSA (obstructive sleep apnea) 08/11/2014  . Healthcare maintenance 07/17/2013  . Chronic kidney disease (CKD), stage III (moderate) 10/04/2012  . Schizophrenia, chronic condition  ([place]) 01/20/2012  . Postmenopausal atrophic vaginitis 11/01/2010  . Chronic diastolic heart failure ([place]) 05/06/2010  . Allergic rhinitis 10/14/2009  . Obesity, Class II, BMI 35-39.9, with comorbidity 05/14/2009  . Opioid dependence ([place]) 04/29/2008  . Anemia 09/25/2007  . Hyperlipidemia 09/03/2006  . Gout 09/03/2006  . Essential hypertension 09/03/2006  . GERD 09/03/2006  . Hx-TIA (transient ischemic attack) 05/13/2001   Past Medical History:  Diagnosis Date  . Chronic venous insufficiency   . Colon, diverticulosis Sept. 2011   outpatient colonoscopy by Dr. Cristina Gong.  Need record  . DDD (degenerative disc disease), lumbosacral   . Depression   . DJD (degenerative joint disease), multiple sites    Low back pain worst  . GERD (gastroesophageal reflux disease)   . Gout   . Gout    Uric Acid level 4.2 on 300 allopurinol  . History of cervical cancer 1972  . Hx-TIA (transient ischemic attack) 05/13/2001   Right facial numbness  . Hyperlipidemia   . Hypertension   . Mitral valve prolapse 10/27/1999   Subsequent 2D echo show normal mitral valve  . Osteoarthritis of hip    bilateral hips  . Ovarian cancer ([place]) 1972   S/P oophorectomy  . Psychosis   . Recurrent boils    History of MRSA skin infections with abscess  . Sensorineural hearing loss of both ears     Family History  Problem Relation Age of Onset  . Diabetes Mother   . Heart disease Mother   . Hearing loss Mother   . Stroke Mother   . Schizophrenia Maternal Grandmother   . Diabetes Maternal Grandfather   . Other Brother     brother died in mental health hospital  . Post-traumatic stress disorder Father     Past Surgical History:  Procedure Laterality Date  . ABDOMINAL HYSTERECTOMY     1972  . CERVICAL DISCECTOMY  7/07   C5-C6  . JOINT REPLACEMENT     bilateral hip replacement  . LUMBAR DISC SURGERY     L5-S1 7/07  . LUMBAR LAMINECTOMY/DECOMPRESSION MICRODISCECTOMY N/A 02/02/2016   Procedure: L4-5  Decompression;  Surgeon: Marybelle Killings, MD;  Location: [facility];  Service: Orthopedics;  Laterality: N/A;  . [place] for ovarian cancer   Social History   Occupational History  . Not on file.   Social History Main Topics  . Smoking status: Former Smoker    Packs/day: 0.10    Types: Cigarettes    Quit date: 04/08/2013  . Smokeless tobacco: Never Used  . Alcohol use No  . Drug use: No  . Sexual activity: No

## 2016-09-05 NOTE — Patient Instructions (Signed)
Try to increase walking daily with your home health assistant/care giver

## 2016-09-06 ENCOUNTER — Telehealth (INDEPENDENT_AMBULATORY_CARE_PROVIDER_SITE_OTHER): Payer: Self-pay | Admitting: Orthopaedic Surgery

## 2016-09-06 NOTE — Telephone Encounter (Signed)
Ask Benjiman Core about the note he saw her and I am not sure what she was asking for. Thank you

## 2016-09-06 NOTE — Telephone Encounter (Signed)
Patient called advised she gave Dr Lorin Mercy some papers to complete for her. Patient want to know when the paper will be faxed back to [facility] (dr Lynnae January).  Patient said there was also a paper that she was suppose to get back concerning her [facility] the [facility] which was suppose to be mailed back to her.     The number to contact her is [phone]

## 2016-09-06 NOTE — Telephone Encounter (Signed)
See note from pt.  I remember she had questions for you, was I supposed to write her a note for prison?  Let me know if I need to do anything.

## 2016-09-06 NOTE — Telephone Encounter (Signed)
Check on papers.

## 2016-09-07 NOTE — Telephone Encounter (Signed)
See notes, can you advise?

## 2016-09-15 ENCOUNTER — Telehealth (INDEPENDENT_AMBULATORY_CARE_PROVIDER_SITE_OTHER): Payer: Self-pay | Admitting: Orthopaedic Surgery

## 2016-09-15 NOTE — Telephone Encounter (Signed)
Ms. Puhl called saying she needs a letter mailed to her home stating she's had bilateral hip replacement surgery so she can visit her son at a [employer]. The machine will go off due to her having metal in her body and she needs a letter stating this. Please mail it to her home at her request. Feel free to call her if you have questions or concerns.  Pt's ph# [phone] Thank you.

## 2016-09-15 NOTE — Telephone Encounter (Signed)
Ok to do thanks. 

## 2016-09-15 NOTE — Telephone Encounter (Signed)
Ok for note 

## 2016-09-18 NOTE — Telephone Encounter (Signed)
Note completed and mailed to patient's home address.

## 2016-09-25 ENCOUNTER — Other Ambulatory Visit: Payer: Self-pay | Admitting: Internal Medicine

## 2016-09-25 DIAGNOSIS — M545 Low back pain, unspecified: Secondary | ICD-10-CM

## 2016-09-26 NOTE — Telephone Encounter (Signed)
I refilled the Toradol per Dr Zenovia Jarred instructions.  I denied the Lidex cream.

## 2016-09-26 NOTE — Telephone Encounter (Signed)
The steroid cream is high potency and she should not be on long term. If she feels she needs, will need to see derm.o F/U. She was told this last RF request.  The tramadol is OK to fill but needs paper script - won't let me e-scribe. Would you plls ask [facility] attending to Rx 60 no RF> Has appt with me Jan   Thanks

## 2016-09-27 ENCOUNTER — Telehealth: Payer: Self-pay

## 2016-09-27 NOTE — Telephone Encounter (Signed)
Called home no answer no voice mail left message on mobile number RX ready for pick

## 2016-10-31 ENCOUNTER — Other Ambulatory Visit: Payer: Self-pay | Admitting: Internal Medicine

## 2016-10-31 DIAGNOSIS — M545 Low back pain, unspecified: Secondary | ICD-10-CM

## 2016-11-01 DIAGNOSIS — H903 Sensorineural hearing loss, bilateral: Secondary | ICD-10-CM | POA: Diagnosis not present

## 2016-11-02 ENCOUNTER — Encounter: Payer: Self-pay | Admitting: Internal Medicine

## 2016-11-02 ENCOUNTER — Ambulatory Visit (INDEPENDENT_AMBULATORY_CARE_PROVIDER_SITE_OTHER): Payer: PPO | Admitting: Internal Medicine

## 2016-11-02 ENCOUNTER — Encounter (INDEPENDENT_AMBULATORY_CARE_PROVIDER_SITE_OTHER): Payer: Self-pay

## 2016-11-02 VITALS — BP 142/92 | HR 82 | Temp 98.7°F | Wt 202.9 lb

## 2016-11-02 DIAGNOSIS — I10 Essential (primary) hypertension: Secondary | ICD-10-CM

## 2016-11-02 DIAGNOSIS — IMO0001 Reserved for inherently not codable concepts without codable children: Secondary | ICD-10-CM

## 2016-11-02 DIAGNOSIS — M25551 Pain in right hip: Secondary | ICD-10-CM

## 2016-11-02 DIAGNOSIS — G8929 Other chronic pain: Secondary | ICD-10-CM

## 2016-11-02 DIAGNOSIS — M79661 Pain in right lower leg: Secondary | ICD-10-CM

## 2016-11-02 DIAGNOSIS — Z79899 Other long term (current) drug therapy: Secondary | ICD-10-CM

## 2016-11-02 DIAGNOSIS — R21 Rash and other nonspecific skin eruption: Secondary | ICD-10-CM | POA: Diagnosis not present

## 2016-11-02 DIAGNOSIS — F1120 Opioid dependence, uncomplicated: Secondary | ICD-10-CM

## 2016-11-02 DIAGNOSIS — Z79891 Long term (current) use of opiate analgesic: Secondary | ICD-10-CM

## 2016-11-02 DIAGNOSIS — Z23 Encounter for immunization: Secondary | ICD-10-CM

## 2016-11-02 DIAGNOSIS — M545 Low back pain: Secondary | ICD-10-CM | POA: Diagnosis not present

## 2016-11-02 DIAGNOSIS — Z6836 Body mass index (BMI) 36.0-36.9, adult: Secondary | ICD-10-CM

## 2016-11-02 DIAGNOSIS — Z87891 Personal history of nicotine dependence: Secondary | ICD-10-CM

## 2016-11-02 DIAGNOSIS — Z0000 Encounter for general adult medical examination without abnormal findings: Secondary | ICD-10-CM

## 2016-11-02 DIAGNOSIS — E669 Obesity, unspecified: Secondary | ICD-10-CM

## 2016-11-02 DIAGNOSIS — M79662 Pain in left lower leg: Secondary | ICD-10-CM

## 2016-11-02 MED ORDER — FLUOCINONIDE 0.05 % EX CREA: TOPICAL_CREAM | CUTANEOUS | 1 refills | Status: DC

## 2016-11-02 NOTE — Addendum Note (Signed)
Addended by: Marcelino Duster on: 11/02/2016 05:02 PM   Modules accepted: Orders

## 2016-11-02 NOTE — Assessment & Plan Note (Signed)
This problem is chronic and stable. I have never been able to get dermatology records to support the need for long-term daily use of Lidex 0.05% which is a high potency steroid. I do not see any skin changes that necessitate the use of Lidex. [name] has run out of Lidex and is using copious amounts of Vaseline but states that her left ankle is still quite itchy. [name] thinks the dry air, the heat, and the electric blanket are all worsening the itchiness. There is some hyperpigmentation to her left ankle distally but again nothing that would require a high potency steroid. I discussed with her the risk of Lidex. I am refilling it once so that I have time to get her scheduled to see dermatology so that I may get their input as to whether the benefits of this medication outweigh the risk. If I can get documentation of such from dermatology I told her I would be happy to then continue to fill it.  PLAN : One RF Lidex Derm referral

## 2016-11-02 NOTE — Patient Instructions (Signed)
1. FOR YOUR SKIN : I am filling the lidex steroid cream. I need you to see dermatology as this is a high potency cream. If dermatology agrees this is safe to use long term on your skin, I will continue to fill it.   2. FOR YOUR PAIN : Your pharmacy sent out your pain medicine, tramadol, yesterday. It will arrive today or tomorrow. You may take it once a day, twice a day on really really bad days. 60 pills must last you one and a half month.    3. I hope you are able to get a hearing aide.   5. Continue to eat healthy.

## 2016-11-02 NOTE — Assessment & Plan Note (Signed)
She is agreeable to getting PCV 23 vaccine today. Her first dose was in 2011. She got PCV 13 in 2016. This will be her last pneumonia vaccine.

## 2016-11-02 NOTE — Assessment & Plan Note (Signed)
This is a chronic problem and is stable. Her pain is multifactorial. It is located in the lower back and the legs. Today she also complains of right hip pain with flexion. She has seen Dr. Ricard Dillon who has stated there is no further surgical intervention needed. She takes tramadol usually at night but occasionally but dose during the day. Her last refill was the end of October and she states she has had none in over a month. I had refilled this December 28 that it has not yet arrived at her home via her mail order pharmacy. Ulis Rias called her pharmacy and it will be delivered today or tomorrow. She is getting 60 pills. I told her and also wrote this on her after visit summary that this is to last her at least 1 1/2 months. This means I would refill it mid February. I did not get a urine drug screen today as she has not taken any in a month. Once I see how long 60 pills last her I will complete a drug contract and get a urine drug screen. I have low suspicion for misuse or abuse that she does have mental health issues and anxiety and this mainly to an appropriate but unintentional increased dosing.  PLAN : Tramadol 60 every 45 days Contract and UDS next appt

## 2016-11-02 NOTE — Assessment & Plan Note (Signed)
This is a chronic problem and is worsening. Her regimen is 10 QD, lasix 40 QD, and cozaar 100 QD. Her blood pressure is up and down, up today, which makes it hard to adjust meds. I believe she is compliant with her meds. Since BP last appt 115/64, I am hesitant to add 3rd med today.  PLAN : cont current meds   BP Readings from Last 3 Encounters:  11/02/16 (!) 142/92  09/05/16 115/64  08/03/16 (!) 150/79

## 2016-11-02 NOTE — Progress Notes (Signed)
   Subjective:    Patient ID: [name], female    DOB: 11/30/1940, 76 y.o.   MRN: [id number]  HPI  [name] is here for itchy skin on L ankle. Please see the A&P for the status of the pt's chronic medical problems.  ROS : per ROS section and in problem oriented charting. All other systems are negative.  PMHx, Soc hx, and / or Fam hx : Has an aide to help at home. Trouble getting on socks. PMHx inc pysch conditions managed by pysch   Review of Systems  Constitutional: Positive for appetite change. Negative for activity change and unexpected weight change.  HENT: Positive for hearing loss.        Dry throat  Eyes: Negative for pain and itching.  Respiratory: Positive for cough.   Cardiovascular: Positive for leg swelling.  Gastrointestinal:       No bloating, no pain  Musculoskeletal: Positive for arthralgias, back pain, gait problem and joint swelling.  Skin: Positive for color change and rash.  Psychiatric/Behavioral: Positive for agitation, confusion and sleep disturbance. The patient is nervous/anxious.        Objective:   Physical Exam  Constitutional: She appears well-developed and well-nourished. She appears distressed.  HENT:  Head: Normocephalic and atraumatic.  Right Ear: External ear normal.  Left Ear: External ear normal.  Nose: Nose normal.  Eyes: Conjunctivae and EOM are normal.  Abdominal: Soft.  Protuberant abd  Musculoskeletal: She exhibits edema.  Trace edema R ankle  Skin: She is not diaphoretic.  Slight inc pigmentation around L ankle but no discrete skin changes, rash, or scaling  Psychiatric: Thought content normal. Her mood appears anxious. Her affect is not angry and not inappropriate. Her speech is rapid and/or pressured. She is agitated. She is not aggressive, not hyperactive and not combative. Cognition and memory are normal. She expresses impulsivity.          Assessment & Plan:

## 2016-11-02 NOTE — Assessment & Plan Note (Signed)
This is a chronic problem and is improving.  At her last appointment she told me she only like to eat cookies, cake, and sweets. I referred her to our nutritionist that when she was called to schedule the appointment she refused. Now she states that she has lost her sweet tooth and is no longer eating sweets. She does like bread. She has lost 4 pounds, dropping from 206 to 202 pounds. Her BMI is still 36.   PLAN : Offer support Tract her BMI

## 2016-11-22 DIAGNOSIS — L2089 Other atopic dermatitis: Secondary | ICD-10-CM | POA: Diagnosis not present

## 2016-11-28 ENCOUNTER — Encounter (HOSPITAL_COMMUNITY): Payer: Self-pay | Admitting: Psychiatry

## 2016-11-28 ENCOUNTER — Telehealth (HOSPITAL_COMMUNITY): Payer: Self-pay

## 2016-11-28 ENCOUNTER — Ambulatory Visit (INDEPENDENT_AMBULATORY_CARE_PROVIDER_SITE_OTHER): Payer: PPO | Admitting: Psychiatry

## 2016-11-28 VITALS — BP 130/78 | HR 75 | Ht 63.0 in | Wt 199.6 lb

## 2016-11-28 DIAGNOSIS — Z90710 Acquired absence of both cervix and uterus: Secondary | ICD-10-CM | POA: Diagnosis not present

## 2016-11-28 DIAGNOSIS — Z822 Family history of deafness and hearing loss: Secondary | ICD-10-CM

## 2016-11-28 DIAGNOSIS — F200 Paranoid schizophrenia: Secondary | ICD-10-CM

## 2016-11-28 DIAGNOSIS — Z7982 Long term (current) use of aspirin: Secondary | ICD-10-CM

## 2016-11-28 DIAGNOSIS — Z79899 Other long term (current) drug therapy: Secondary | ICD-10-CM

## 2016-11-28 DIAGNOSIS — Z823 Family history of stroke: Secondary | ICD-10-CM

## 2016-11-28 DIAGNOSIS — Z8249 Family history of ischemic heart disease and other diseases of the circulatory system: Secondary | ICD-10-CM

## 2016-11-28 DIAGNOSIS — Z87891 Personal history of nicotine dependence: Secondary | ICD-10-CM

## 2016-11-28 DIAGNOSIS — Z888 Allergy status to other drugs, medicaments and biological substances status: Secondary | ICD-10-CM

## 2016-11-28 DIAGNOSIS — Z9889 Other specified postprocedural states: Secondary | ICD-10-CM | POA: Diagnosis not present

## 2016-11-28 DIAGNOSIS — Z833 Family history of diabetes mellitus: Secondary | ICD-10-CM

## 2016-11-28 DIAGNOSIS — Z882 Allergy status to sulfonamides status: Secondary | ICD-10-CM

## 2016-11-28 MED ORDER — HALOPERIDOL 1 MG PO TABS: 1.0000 mg | ORAL_TABLET | Freq: Every day | ORAL | 2 refills | Status: DC

## 2016-11-28 MED ORDER — HYDROXYZINE PAMOATE 25 MG PO CAPS: 25.0000 mg | ORAL_CAPSULE | Freq: Every day | ORAL | 2 refills | Status: DC

## 2016-11-28 MED ORDER — BUPROPION HCL ER (XL) 300 MG PO TB24: 300.0000 mg | ORAL_TABLET | Freq: Every morning | ORAL | 2 refills | Status: DC

## 2016-11-28 NOTE — Telephone Encounter (Signed)
Medication management - Telephone call with Diane from [facility] to schedule patient for a neurological consult.  Patient still here so scheduled for 12/20/16 at 1:30pm with Dr. Jannifer Franklin.  Patient provided all schedule and location information.  Patient agreed to call [facility] this date to go ahead and schedule transportation to neurological consult appointment.  Patient to call this nurse back if any questions and provided this nurses number if needed.
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
## 2016-12-05 ENCOUNTER — Telehealth (HOSPITAL_COMMUNITY): Payer: Self-pay

## 2016-12-05 NOTE — Telephone Encounter (Signed)
Patient called and was upset that she was unable to get her Hydroxyzine. It was called into her pharmacy at her visit last week, but needs a prior auth. The pharmacy never notified me about the prior auth, I called and started the process - it was sent to review by a pharmacist and they should call me in a day or two. I called patient and let her know.

## 2016-12-06 ENCOUNTER — Telehealth (HOSPITAL_COMMUNITY): Payer: Self-pay

## 2016-12-06 NOTE — Telephone Encounter (Signed)
Fax received today for patients approval of Hydroxyzine. I called the pharmacy to let them know and I called the patient.

## 2016-12-08 ENCOUNTER — Ambulatory Visit (INDEPENDENT_AMBULATORY_CARE_PROVIDER_SITE_OTHER): Payer: PPO | Admitting: Orthopaedic Surgery

## 2016-12-20 ENCOUNTER — Ambulatory Visit (INDEPENDENT_AMBULATORY_CARE_PROVIDER_SITE_OTHER): Payer: PPO | Admitting: Neurology

## 2016-12-20 ENCOUNTER — Encounter: Payer: Self-pay | Admitting: Neurology

## 2016-12-20 VITALS — BP 131/71 | HR 74 | Ht 63.0 in | Wt 199.5 lb

## 2016-12-20 DIAGNOSIS — R413 Other amnesia: Secondary | ICD-10-CM | POA: Diagnosis not present

## 2016-12-20 DIAGNOSIS — E538 Deficiency of other specified B group vitamins: Secondary | ICD-10-CM

## 2016-12-20 HISTORY — DX: Other amnesia: R41.3

## 2016-12-20 NOTE — Progress Notes (Signed)
Reason for visit: Memory disorder  Referring physician: Dr. Ferrel Logan [name] is a 76 y.o. female  History of present illness:  [name] is a 76 year old left-handed black female with a history of a memory disorder that she believes began around September 2017. The patient has had difficulty with short-term memory, she has always had some troubles with her memory of names for people. The patient denies any significant issues with word finding problems. She is able to keep up with her medications and appointments, she handles her own finances without difficulty. She does have a history of sleep apnea, she is not using CPAP, and she is having ongoing issues with fatigue. She denies any excessive drowsiness during the day, she can read a newspaper or watch TV without falling asleep. She does have some troubles with balance, she uses a cane for ambulation. She does have some urinary urgency and she is on oxybutynin for this. The patient denies any recent falls. She is sent to this office for further evaluation. She has a history of schizophrenia.  Past Medical History:  Diagnosis Date  . Chronic venous insufficiency   . Colon, diverticulosis Sept. 2011   outpatient colonoscopy by Dr. Cristina Gong.  Need record  . DDD (degenerative disc disease), lumbosacral   . Depression   . DJD (degenerative joint disease), multiple sites    Low back pain worst  . GERD (gastroesophageal reflux disease)   . Gout   . Gout    Uric Acid level 4.2 on 300 allopurinol  . History of cervical cancer 1972  . Hx-TIA (transient ischemic attack) 05/13/2001   Right facial numbness  . Hyperlipidemia   . Hypertension   . Memory disorder 12/20/2016  . Mitral valve prolapse 10/27/1999   Subsequent 2D echo show normal mitral valve  . Osteoarthritis of hip    bilateral hips  . Ovarian cancer ([place]) 1972   S/P oophorectomy  . Psychosis   . Recurrent boils    History of MRSA skin infections with abscess  .  Sensorineural hearing loss of both ears     Past Surgical History:  Procedure Laterality Date  . ABDOMINAL HYSTERECTOMY     1972  . CERVICAL DISCECTOMY  7/07   C5-C6  . JOINT REPLACEMENT     bilateral hip replacement  . LUMBAR DISC SURGERY     L5-S1 7/07  . LUMBAR LAMINECTOMY/DECOMPRESSION MICRODISCECTOMY N/A 02/02/2016   Procedure: L4-5 Decompression;  Surgeon: Marybelle Killings, MD;  Location: [facility];  Service: Orthopedics;  Laterality: N/A;  . [place] for ovarian cancer    Family History  Problem Relation Age of Onset  . Diabetes Mother   . Heart disease Mother   . Hearing loss Mother   . Stroke Mother   . Post-traumatic stress disorder Father   . Schizophrenia Maternal Grandmother   . Diabetes Maternal Grandfather   . Other Brother     brother died in mental health hospital    Social history:  reports that she quit smoking about 3 years ago. Her smoking use included Cigarettes. She smoked 0.10 packs per day. She has never used smokeless tobacco. She reports that she does not drink alcohol or use drugs.  Medications:  Prior to Admission medications   Medication Sig Start Date End Date Taking? Authorizing Provider  allopurinol (ZYLOPRIM) 300 MG tablet TAKE 1 TABLET BY MOUTH DAILY 05/25/16  Yes Bartholomew Crews, MD  amLODipine (NORVASC) 10 MG  tablet TAKE 1 TABLET BY MOUTH EVERY DAY 05/25/16  Yes Bartholomew Crews, MD  ascorbic acid (VITAMIN C) 500 MG tablet Take 500 mg by mouth daily.   Yes Historical Provider, MD  aspirin EC 81 MG tablet Take 81 mg by mouth daily.   Yes Historical Provider, MD  atorvastatin (LIPITOR) 40 MG tablet TAKE 1 TABLET BY MOUTH EVERY DAY 06/27/16  Yes Bartholomew Crews, MD  buPROPion (WELLBUTRIN XL) 300 MG 24 hr tablet Take 1 tablet (300 mg total) by mouth every morning. 11/28/16  Yes Kathlee Nations, MD  Calcium Carbonate-Vitamin D (CALCIUM-VITAMIN D) 500-200 MG-UNIT per tablet Take 1 tablet by mouth daily.     Yes Historical Provider, MD    diphenhydrAMINE (BENADRYL) 25 MG tablet Take 25 mg by mouth every 6 (six) hours as needed.   Yes Historical Provider, MD  docusate sodium (COLACE) 100 MG capsule Take 1 capsule (100 mg total) by mouth every 12 (twelve) hours. 12/21/13  Yes Elnora Morrison, MD  fluocinonide cream (LIDEX) 0.05 % APPLY TO AFFECTED AREA TWICE DAILY 11/02/16  Yes Bartholomew Crews, MD  fluticasone [facility]) 50 MCG/ACT nasal spray INSTILL 2 SPRAYS IN EACH NOSTRIL DAILY 01/28/16  Yes Bartholomew Crews, MD  furosemide (LASIX) 20 MG tablet TAKE ONE TABLET BY MOUTH DAILY.*MAY TAKE AN EXTRA ONE AS NEEDED ONCE DAILY IF SWELLING OR EDEMA IS BAD* 02/28/16  Yes Bartholomew Crews, MD  hydrOXYzine (VISTARIL) 25 MG capsule Take 1 capsule (25 mg total) by mouth at bedtime. 11/28/16  Yes Kathlee Nations, MD  losartan (COZAAR) 100 MG tablet TAKE ONE TABLET BY MOUTH DAILY 02/28/16  Yes Bartholomew Crews, MD  Multiple Vitamins-Minerals (ALIVE WOMENS ENERGY) TABS Take by mouth.   Yes Historical Provider, MD  OVER THE COUNTER MEDICATION calciboost   Yes Historical Provider, MD  oxybutynin (DITROPAN-XL) 5 MG 24 hr tablet TAKE ONE TABLET BY MOUTH AT BEDTIME 05/25/16  Yes Bartholomew Crews, MD  pantoprazole (PROTONIX) 40 MG tablet TAKE 1 TABLET BY MOUTH TWICE DAILY BEFORE A MEAL 05/25/16  Yes Bartholomew Crews, MD  potassium chloride (MICRO-K) 10 MEQ CR capsule TAKE 1 CAPSULE BY MOUTH DAILY 05/25/16  Yes Bartholomew Crews, MD  senna (SENOKOT) 8.6 MG TABS tablet Take 1 tablet by mouth every other day.   Yes Historical Provider, MD  traMADol (ULTRAM) 50 MG tablet TAKE ONE TABLET BY MOUTH TWO TIMES DAILY AS NEEDED. 09/26/16  Yes Lucious Groves, DO      Allergies  Allergen Reactions  . Aspirin Other (See Comments)    bleeding  . Sulfamethoxazole-Trimethoprim Itching    ROS:  Out of a complete 14 system review of symptoms, the patient complains only of the following symptoms, and all other reviewed systems are negative.  Swelling in the  legs Hearing loss Difficulty swallowing Blurred vision Shortness of breath, cough Increased thirst Joint pain Memory loss, confusion Numbness, weakness, difficulty swallowing Anxiety Sleepiness  Blood pressure 131/71, pulse 74, height 5\' 3"  (1.6 m), weight 199 lb 8 oz (90.5 kg).  Physical Exam  General: The patient is alert and cooperative at the time of the examination. The patient is moderately obese.  Eyes: Pupils are equal, round, and reactive to light. Discs are flat bilaterally.  Neck: The neck is supple, no carotid bruits are noted.  Respiratory: The respiratory examination is clear.  Cardiovascular: The cardiovascular examination reveals a regular rate and rhythm, no obvious murmurs or rubs are noted.  Neuromuscular: The  patient lacks about 40 of lateral rotation of the cervical spine bilaterally. The patient has limitation of flexion and extension of the neck as well.  Skin: Extremities are with 1+ edema at the ankles bilaterally.  Neurologic Exam  Mental status: The patient is alert and oriented x 3 at the time of the examination. The patient has apparent normal recent and remote memory, with an apparently normal attention span and concentration ability. Mini-Mental Status Examination done today shows a total score 26/30.  Cranial nerves: Facial symmetry is present. There is good sensation of the face to pinprick and soft touch bilaterally. The strength of the facial muscles and the muscles to head turning and shoulder shrug are normal bilaterally. Speech is well enunciated, no aphasia or dysarthria is noted. Extraocular movements are full. Visual fields are full. The tongue is midline, and the patient has symmetric elevation of the soft palate. No obvious hearing deficits are noted. The patient has continual movements of the jaw and tongue.  Motor: The motor testing reveals 5 over 5 strength of all 4 extremities. Good symmetric motor tone is noted  throughout.  Sensory: Sensory testing is intact to pinprick, soft touch, vibration sensation, and position sense on all 4 extremities. No evidence of extinction is noted.  Coordination: Cerebellar testing reveals good finger-nose-finger and heel-to-shin bilaterally.  Gait and station: Gait is normal. Tandem gait is normal. Romberg is negative. No drift is seen.  Reflexes: Deep tendon reflexes are symmetric, but are somewhat brisk bilaterally. There is no evidence of clonus at the ankles Toes are upgoing bilaterally.   Assessment/Plan:  1. Memory disturbance  2. History of schizophrenia  3. Hyperreflexia  4. Gait disorder  The patient appears to have evidence of hyperreflexia on clinical examination. The patient will be set up for blood work today, she will have MRI of the brain to evaluate the memory disorder. The patient could potentially have cervical spine disease as well resulting in some of the hyperreflexia, and difficulty with bladder control and gait. The patient is on oxybutynin and Benadryl which are anticholinergic medications, I have recommended cessation of these drugs, the patient may be able to take Myrbetriq with fewer cognitive side effects and still have some benefit with the bladder. If the MRI of the brain does not explain the hyperreflexia, MRI of the cervical spine may be done in the future. She will follow-up in about 6 months. We will consider the use of a medication for memory in the future.  Jill Alexanders MD 12/20/2016 2:44 PM  [facility] [address] [place], [place] [address]  Phone [phone] Fax [phone]

## 2016-12-20 NOTE — Patient Instructions (Signed)
   We will check blood work and get MRI of the brain. 

## 2016-12-21 ENCOUNTER — Telehealth: Payer: Self-pay | Admitting: *Deleted

## 2016-12-21 LAB — VITAMIN B12: Vitamin B-12: 821 pg/mL (ref 232–1245)

## 2016-12-21 LAB — SEDIMENTATION RATE: Sed Rate: 22 mm/hr (ref 0–40)

## 2016-12-21 LAB — RPR: RPR Ser Ql: NONREACTIVE

## 2016-12-21 NOTE — Telephone Encounter (Signed)
Called and spoke to daughter, [name] about lab results per CW,MD note. She verbalized understanding.

## 2016-12-21 NOTE — Telephone Encounter (Signed)
-----   Message from Kathrynn Ducking, MD sent at 12/21/2016  7:17 AM EST -----   The blood work results are unremarkable. Please call the patient.  ----- Message ----- From: Lavone Neri Lab Results In Sent: 12/21/2016   5:42 AM To: Kathrynn Ducking, MD

## 2016-12-22 ENCOUNTER — Telehealth: Payer: Self-pay

## 2016-12-22 NOTE — Telephone Encounter (Signed)
Needs to speak with a nurse regarding meds. Please call back.  

## 2016-12-22 NOTE — Telephone Encounter (Signed)
She has never had a low B12 level and her most recent level was nl. I cannot see where Dr Jannifer Franklin rec B12 supplementation.  She may stop oxybutynin. I will discuss mirabegron at next appt. Early March.

## 2016-12-22 NOTE — Telephone Encounter (Signed)
Tried to call pt back, no answer, will call monday

## 2016-12-22 NOTE — Telephone Encounter (Signed)
Pt calls and states that dr Jannifer Franklin would like for her to stop oxybutynin and to start B12. Could you please review note and she wants me to call pharmacy  And make sure she doesn't get something shes not suppose to take Please advise

## 2016-12-27 NOTE — Telephone Encounter (Signed)
No answer

## 2016-12-28 NOTE — Telephone Encounter (Signed)
Lm, no answer

## 2016-12-29 ENCOUNTER — Other Ambulatory Visit: Payer: Self-pay | Admitting: Internal Medicine

## 2017-01-01 ENCOUNTER — Other Ambulatory Visit: Payer: Self-pay

## 2017-01-01 ENCOUNTER — Encounter: Payer: Self-pay | Admitting: Internal Medicine

## 2017-01-01 DIAGNOSIS — M545 Low back pain, unspecified: Secondary | ICD-10-CM

## 2017-01-02 ENCOUNTER — Telehealth: Payer: Self-pay

## 2017-01-02 MED ORDER — TRAMADOL HCL 50 MG PO TABS: ORAL_TABLET | ORAL | 0 refills | Status: DC

## 2017-01-02 NOTE — Telephone Encounter (Signed)
Tramadol called into pharmacy

## 2017-01-04 ENCOUNTER — Encounter: Payer: Self-pay | Admitting: Internal Medicine

## 2017-01-04 ENCOUNTER — Ambulatory Visit (INDEPENDENT_AMBULATORY_CARE_PROVIDER_SITE_OTHER): Payer: PPO | Admitting: Internal Medicine

## 2017-01-04 DIAGNOSIS — Z87891 Personal history of nicotine dependence: Secondary | ICD-10-CM

## 2017-01-04 DIAGNOSIS — Z79891 Long term (current) use of opiate analgesic: Secondary | ICD-10-CM | POA: Diagnosis not present

## 2017-01-04 DIAGNOSIS — F209 Schizophrenia, unspecified: Secondary | ICD-10-CM | POA: Diagnosis not present

## 2017-01-04 DIAGNOSIS — I5032 Chronic diastolic (congestive) heart failure: Secondary | ICD-10-CM

## 2017-01-04 DIAGNOSIS — I10 Essential (primary) hypertension: Secondary | ICD-10-CM

## 2017-01-04 DIAGNOSIS — Z79899 Other long term (current) drug therapy: Secondary | ICD-10-CM

## 2017-01-04 DIAGNOSIS — H9190 Unspecified hearing loss, unspecified ear: Secondary | ICD-10-CM | POA: Diagnosis not present

## 2017-01-04 DIAGNOSIS — R21 Rash and other nonspecific skin eruption: Secondary | ICD-10-CM | POA: Diagnosis not present

## 2017-01-04 DIAGNOSIS — I110 Hypertensive heart disease with heart failure: Secondary | ICD-10-CM | POA: Diagnosis not present

## 2017-01-04 NOTE — Patient Instructions (Signed)
1. See me in 3 months 2. Your blood pressure is great

## 2017-01-05 NOTE — Assessment & Plan Note (Signed)
This problem is chronic and stable. She has been out of her tramadol for about one week. She states that she takes 1 or 2 per day which is congruent with her refill history. Some nights she takes it at bedtime; other days she takes it during the day. The medicine helps her sleep. Since she has been out of it for the past week, she can sleep but she falls asleep later and sleeps later in the day. There have been no red flags. Her mental health issues seem to be stable. She does not seem to be having any significant side effects from the tramadol. I cannot get a urine drug screen today and she has been out of it for 1 week. I cannot complete a contract as her hearing issues are going to prevent any meaningful discussion of the contract and she really would not be able to give consent.  PLAN:  Cont current meds

## 2017-01-05 NOTE — Progress Notes (Signed)
   Subjective:    Patient ID: [name], female    DOB: Dec 11, 1940, 76 y.o.   MRN: [id number]  HPI  [name] is here for pain F/U. Please see the A&P for the status of the pt's chronic medical problems.  ROS : per ROS section and in problem oriented charting. All other systems are negative.  PMHx, Soc hx, and / or Fam hx : She has an aide who is coming on Mondays, Wednesdays, and Fridays. She comes for 2 hours at a time. She has been having trouble walking so her aide was going to take her for a walk outside every day. However it has been raining and she has not been able to walk outside. The Catholic priest comes on the third Saturday to give her communion in her house. She is scared to go outside due to the flu that is circulating and fear of falling. SCAT brings her to her doctor's appointment. She was seen by ear nose and throat doctor recently who did a hearing test. I do not have those records. Patient states she will get a hearing aid.   Review of Systems  Reason unable to perform ROS: ROS challenging as patient is very hard of hearing.  Constitutional: Negative for unexpected weight change.  HENT: Positive for hearing loss.   Musculoskeletal: Positive for back pain and gait problem.  Skin: Positive for rash.       Positive for itching   Psychiatric/Behavioral: Positive for sleep disturbance.       Objective:   Physical Exam  Constitutional: She appears well-developed and well-nourished. No distress.  HENT:  Head: Normocephalic and atraumatic.  Right Ear: External ear normal.  Left Ear: External ear normal.  Nose: Nose normal.  Eyes: Conjunctivae and EOM are normal.  Cardiovascular: Normal rate, regular rhythm and normal heart sounds.   No murmur heard. Pulmonary/Chest: Effort normal and breath sounds normal. No respiratory distress.  Abdominal: Soft. Bowel sounds are normal.  Musculoskeletal: She exhibits tenderness. She exhibits no edema or deformity.    Skin: She is not diaphoretic.  Psychiatric: She has a normal mood and affect. Her behavior is normal. Judgment and thought content normal.          Assessment & Plan:

## 2017-01-05 NOTE — Assessment & Plan Note (Signed)
This problem is chronic and stable. She told me today that at her last psychiatry appointment, and I have reviewed psychiatry's note, that he continue her Wellbutrin and Haldol that started Vistaril as a sleep agent. Due to memory issues that she described, her psychiatrist who referred her to neurology. I have read neurology's note. Her B12 level was normal but they recommended she take a B12 supplement which she has done. They are also recommending MRI to check for cervical spine disease as she was hyperreflexic. Her hearing loss could also be playing a role in her memory issues but she states that she is in the process of getting a hearing aid.  PLAN : Continue to follow consultants' notes Hopefully she will be able to get her hearing aid

## 2017-01-05 NOTE — Assessment & Plan Note (Signed)
This problem is chronic and stable. Her medication regimen includes Lasix 40, Cozaar 100, and amlodipine 10. She has no side effects to these medications. There is no indication that she is not taking them every day. Blood pressure is acceptable as she is not diabetic nor has coronary artery disease.  PLAN:  Cont current meds   BP Readings from Last 3 Encounters:  01/04/17 (!) 135/58  12/20/16 131/71  11/28/16 130/78

## 2017-01-05 NOTE — Assessment & Plan Note (Signed)
This problem is chronic and stable. It was found and an echo in 2011. To my knowledge she has never had volume overload. She is euvolemic on examination today.  PLAN : Continue to follow

## 2017-01-05 NOTE — Assessment & Plan Note (Signed)
This problem is chronic and stable. Today she tells me that she did see the dermatologist. I do not have those notes but we are going to request them. The dermatologist gave her a new cream to use, fluocinonide 0.05%, which is generic for Lidex the high potency corticosteroid she had been using. I will need to review the notes to see if they gave a duration or any limitations on this medication.  Plan : Obtain dermatology notes

## 2017-01-13 ENCOUNTER — Emergency Department (HOSPITAL_COMMUNITY)
Admission: EM | Admit: 2017-01-13 | Discharge: 2017-01-13 | Disposition: A | Discharge status: Home / Self Care | Payer: PPO | Attending: Emergency Medicine | Admitting: Emergency Medicine

## 2017-01-13 ENCOUNTER — Emergency Department (HOSPITAL_COMMUNITY): Payer: PPO

## 2017-01-13 ENCOUNTER — Encounter (HOSPITAL_COMMUNITY): Payer: Self-pay

## 2017-01-13 DIAGNOSIS — S301XXA Contusion of abdominal wall, initial encounter: Secondary | ICD-10-CM | POA: Diagnosis not present

## 2017-01-13 DIAGNOSIS — S61411A Laceration without foreign body of right hand, initial encounter: Secondary | ICD-10-CM | POA: Diagnosis not present

## 2017-01-13 DIAGNOSIS — Y929 Unspecified place or not applicable: Secondary | ICD-10-CM | POA: Diagnosis not present

## 2017-01-13 DIAGNOSIS — I130 Hypertensive heart and chronic kidney disease with heart failure and stage 1 through stage 4 chronic kidney disease, or unspecified chronic kidney disease: Secondary | ICD-10-CM | POA: Insufficient documentation

## 2017-01-13 DIAGNOSIS — N183 Chronic kidney disease, stage 3 (moderate): Secondary | ICD-10-CM | POA: Insufficient documentation

## 2017-01-13 DIAGNOSIS — I509 Heart failure, unspecified: Secondary | ICD-10-CM | POA: Insufficient documentation

## 2017-01-13 DIAGNOSIS — Z87891 Personal history of nicotine dependence: Secondary | ICD-10-CM | POA: Diagnosis not present

## 2017-01-13 DIAGNOSIS — Z7982 Long term (current) use of aspirin: Secondary | ICD-10-CM | POA: Insufficient documentation

## 2017-01-13 DIAGNOSIS — S300XXA Contusion of lower back and pelvis, initial encounter: Secondary | ICD-10-CM

## 2017-01-13 DIAGNOSIS — Y999 Unspecified external cause status: Secondary | ICD-10-CM | POA: Diagnosis not present

## 2017-01-13 DIAGNOSIS — Y93K9 Activity, other involving animal care: Secondary | ICD-10-CM | POA: Diagnosis not present

## 2017-01-13 DIAGNOSIS — W01198A Fall on same level from slipping, tripping and stumbling with subsequent striking against other object, initial encounter: Secondary | ICD-10-CM | POA: Diagnosis not present

## 2017-01-13 DIAGNOSIS — S3992XA Unspecified injury of lower back, initial encounter: Secondary | ICD-10-CM | POA: Diagnosis not present

## 2017-01-13 DIAGNOSIS — Z8673 Personal history of transient ischemic attack (TIA), and cerebral infarction without residual deficits: Secondary | ICD-10-CM | POA: Insufficient documentation

## 2017-01-13 DIAGNOSIS — Z96643 Presence of artificial hip joint, bilateral: Secondary | ICD-10-CM | POA: Insufficient documentation

## 2017-01-13 DIAGNOSIS — S0990XA Unspecified injury of head, initial encounter: Secondary | ICD-10-CM | POA: Diagnosis not present

## 2017-01-13 DIAGNOSIS — S61511A Laceration without foreign body of right wrist, initial encounter: Secondary | ICD-10-CM | POA: Insufficient documentation

## 2017-01-13 MED ORDER — HYDROCODONE-ACETAMINOPHEN 5-325 MG PO TABS
1.0000 | ORAL_TABLET | ORAL | Status: AC
  Administered 2017-01-13: 1 via ORAL
  Filled 2017-01-13: qty 1

## 2017-01-13 MED ORDER — LIDOCAINE HCL (PF) 1 % IJ SOLN
5.0000 mL | Freq: Once | INTRAMUSCULAR | Status: AC
  Administered 2017-01-13: 5 mL
  Filled 2017-01-13: qty 5

## 2017-01-13 NOTE — ED Notes (Signed)
Patient states ready to leave does not want discharge vitals

## 2017-01-13 NOTE — ED Notes (Signed)
Wound cleaned, Neosporin and dressing applied

## 2017-01-13 NOTE — ED Provider Notes (Signed)
[facility] DEPT Provider Note   CSN: [id number] Arrival date & time: 01/13/17  0203     History   Chief Complaint Chief Complaint  Patient presents with  . Laceration    HPI [name] is a 76 y.o. female.  HPI Patient presents to the emergency room for evaluation of pain associated with the fall. The patient was going to feed the neighbors dog a treat. The patient usually gets around with a scooter. She injured getting off the scooter and walking up to the dog. The dog was excited to see her and jumped on her to get to treat. Patient fell backwards landing on her back and striking her head. She did not lose consciousness. The dog jumped on top of her to get to treat and she sustained laceration to right hand from being scratched and also bruised her abdomen. She denies any difficulty breathing. No nausea or vomiting. Patient was not bitten by the dog. The patient's tetanus shot has been within the last 10 years. Past Medical History:  Diagnosis Date  . Chronic venous insufficiency   . Colon, diverticulosis Sept. 2011   outpatient colonoscopy by Dr. Cristina Gong.  Need record  . DDD (degenerative disc disease), lumbosacral   . Depression   . DJD (degenerative joint disease), multiple sites    Low back pain worst  . GERD (gastroesophageal reflux disease)   . Gout   . Gout    Uric Acid level 4.2 on 300 allopurinol  . History of cervical cancer 1972  . Hx-TIA (transient ischemic attack) 05/13/2001   Right facial numbness  . Hyperlipidemia   . Hypertension   . Memory disorder 12/20/2016  . Mitral valve prolapse 10/27/1999   Subsequent 2D echo show normal mitral valve  . Osteoarthritis of hip    bilateral hips  . Ovarian cancer ([place]) 1972   S/P oophorectomy  . Psychosis   . Recurrent boils    History of MRSA skin infections with abscess  . Sensorineural hearing loss of both ears     Patient Active Problem List   Diagnosis Date Noted  . Memory disorder 12/20/2016  .  History of lumbar laminectomy for spinal cord decompression 02/02/2016  . Rash and nonspecific skin eruption 07/09/2015  . Bereavement 12/01/2014  . Psychosis 12/01/2014  . Reactive airway disease 08/11/2014  . History of cancer 08/11/2014  . OSA (obstructive sleep apnea) 08/11/2014  . Healthcare maintenance 07/17/2013  . Chronic kidney disease (CKD), stage III (moderate) 10/04/2012  . Schizophrenia, chronic condition ([place]) 01/20/2012  . Postmenopausal atrophic vaginitis 11/01/2010  . Chronic diastolic heart failure ([place]) 05/06/2010  . Allergic rhinitis 10/14/2009  . Obesity, Class II, BMI 35-39.9, with comorbidity 05/14/2009  . Chronic prescription opiate use 04/29/2008  . Anemia 09/25/2007  . Hyperlipidemia 09/03/2006  . Gout 09/03/2006  . Essential hypertension 09/03/2006  . GERD 09/03/2006  . Hx-TIA (transient ischemic attack) 05/13/2001    Past Surgical History:  Procedure Laterality Date  . ABDOMINAL HYSTERECTOMY     1972  . CERVICAL DISCECTOMY  7/07   C5-C6  . JOINT REPLACEMENT     bilateral hip replacement  . LUMBAR DISC SURGERY     L5-S1 7/07  . LUMBAR LAMINECTOMY/DECOMPRESSION MICRODISCECTOMY N/A 02/02/2016   Procedure: L4-5 Decompression;  Surgeon: Marybelle Killings, MD;  Location: [facility];  Service: Orthopedics;  Laterality: N/A;  . [place] for ovarian cancer    OB History    No data available  Home Medications    Prior to Admission medications   Medication Sig Start Date End Date Taking? Authorizing Provider  allopurinol (ZYLOPRIM) 300 MG tablet TAKE 1 TABLET BY MOUTH DAILY 05/25/16   Bartholomew Crews, MD  amLODipine (NORVASC) 10 MG tablet TAKE 1 TABLET BY MOUTH EVERY DAY 05/25/16   Bartholomew Crews, MD  ascorbic acid (VITAMIN C) 500 MG tablet Take 500 mg by mouth daily.    Historical Provider, MD  aspirin EC 81 MG tablet Take 81 mg by mouth daily.    Historical Provider, MD  atorvastatin (LIPITOR) 40 MG tablet TAKE 1 TABLET BY MOUTH  EVERY DAY 06/27/16   Bartholomew Crews, MD  buPROPion (WELLBUTRIN XL) 300 MG 24 hr tablet Take 1 tablet (300 mg total) by mouth every morning. 11/28/16   Kathlee Nations, MD  Calcium Carbonate-Vitamin D (CALCIUM-VITAMIN D) 500-200 MG-UNIT per tablet Take 1 tablet by mouth daily.      Historical Provider, MD  diphenhydrAMINE (BENADRYL) 25 MG tablet Take 25 mg by mouth every 6 (six) hours as needed.    Historical Provider, MD  docusate sodium (COLACE) 100 MG capsule Take 1 capsule (100 mg total) by mouth every 12 (twelve) hours. 12/21/13   Elnora Morrison, MD  fluocinonide cream (LIDEX) 0.05 % APPLY TO AFFECTED AREA TWICE DAILY 11/02/16   Bartholomew Crews, MD  fluticasone [facility]) 50 MCG/ACT nasal spray INSTILL 2 SPRAYS IN EACH NOSTRIL DAILY 01/01/17   Bartholomew Crews, MD  furosemide (LASIX) 20 MG tablet TAKE ONE TABLET BY MOUTH DAILY. *MAY TAKE AN EXTRA ONE AS NEEDED ONCE DAILY IF SWELLING OR EDEMA IS BAD* 02/28/16   Bartholomew Crews, MD  hydrOXYzine (VISTARIL) 25 MG capsule Take 1 capsule (25 mg total) by mouth at bedtime. 11/28/16   Kathlee Nations, MD  losartan (COZAAR) 100 MG tablet TAKE ONE TABLET BY MOUTH DAILY 02/28/16   Bartholomew Crews, MD  Multiple Vitamins-Minerals (ALIVE WOMENS ENERGY) TABS Take by mouth.    Historical Provider, MD  [place]    Historical Provider, MD  oxybutynin (DITROPAN-XL) 5 MG 24 hr tablet TAKE ONE TABLET BY MOUTH AT BEDTIME 05/25/16   Bartholomew Crews, MD  pantoprazole (PROTONIX) 40 MG tablet TAKE 1 TABLET BY MOUTH TWICE DAILY BEFORE A MEAL 05/25/16   Bartholomew Crews, MD  potassium chloride (MICRO-K) 10 MEQ CR capsule TAKE 1 CAPSULE BY MOUTH DAILY 05/25/16   Bartholomew Crews, MD  senna (SENOKOT) 8.6 MG TABS tablet Take 1 tablet by mouth every other day.    Historical Provider, MD  traMADol (ULTRAM) 50 MG tablet TAKE ONE TABLET BY MOUTH TWO TIMES DAILY AS NEEDED. Patient not taking: Reported on 01/04/2017 01/02/17   Bartholomew Crews,  MD    Family History Family History  Problem Relation Age of Onset  . Diabetes Mother   . Heart disease Mother   . Hearing loss Mother   . Stroke Mother   . Post-traumatic stress disorder Father   . Schizophrenia Maternal Grandmother   . Diabetes Maternal Grandfather   . Other Brother     brother died in mental health hospital    Social History Social History  Substance Use Topics  . Smoking status: Former Smoker    Packs/day: 0.10    Types: Cigarettes    Quit date: 04/08/2013  . Smokeless tobacco: Never Used  . Alcohol use No     Allergies   Aspirin and Sulfamethoxazole-trimethoprim  Review of Systems Review of Systems  All other systems reviewed and are negative.    Physical Exam Updated Vital Signs BP 135/69   Pulse 78   Temp 99 F (37.2 C) (Oral)   Resp 18   SpO2 95%   Physical Exam  Constitutional: No distress.  HENT:  Head: Normocephalic and atraumatic.  Right Ear: External ear normal.  Left Ear: External ear normal.  Eyes: Conjunctivae are normal. Right eye exhibits no discharge. Left eye exhibits no discharge. No scleral icterus.  Neck: Neck supple. No tracheal deviation present.  Cardiovascular: Normal rate, regular rhythm and intact distal pulses.   Pulmonary/Chest: Effort normal and breath sounds normal. No stridor. No respiratory distress. She has no wheezes. She has no rales.  Abdominal: Soft. Bowel sounds are normal. She exhibits no distension. There is tenderness. There is no rebound and no guarding.  3-4 cm area of ecchymoses right lower abdomen, no tenderness elsewhere, abdomen is soft  Musculoskeletal: She exhibits tenderness. She exhibits no edema.       Cervical back: Normal.       Thoracic back: Normal.       Lumbar back: She exhibits tenderness and bony tenderness.  Laceration over first metacarpal region of thumb, no tendon or joint involvement  Neurological: She is alert. She has normal strength. No cranial nerve deficit (no  facial droop, extraocular movements intact, no slurred speech) or sensory deficit. She exhibits normal muscle tone. She displays no seizure activity. Coordination normal.  Skin: Skin is warm and dry. No rash noted. She is not diaphoretic.  Psychiatric: She has a normal mood and affect.  Nursing note and vitals reviewed.    ED Treatments / Results  Labs (all labs ordered are listed, but only abnormal results are displayed) Labs Reviewed - No data to display   Radiology Dg Lumbar Spine Complete  Result Date: 01/13/2017 CLINICAL DATA:  Patient fell.  Bruising to the abdomen. EXAM: LUMBAR SPINE - COMPLETE 4+ VIEW COMPARISON:  Lumbar spine radiographs 02/02/2016. MRI lumbar spine 12/31/2015 FINDINGS: Normal alignment of the lumbar spine. Diffuse degenerative changes throughout with narrowed interspaces and endplate hypertrophic changes. Degenerative disc disease at L4-5 level. No vertebral compression deformities. No focal bone lesion or bone destruction. There appears to been a partial laminectomy at L5 with partial resection of the spinous process. Probable posterior bone grafts versus severe facet joint degenerative change. Visualized sacrum appears intact. Bilateral hip prostheses, incompletely visualized. IMPRESSION: Severe degenerative changes in the lumbar spine. Postoperative changes. No acute displaced fractures appreciated. Electronically Signed   By: Lucienne Capers M.D.   On: 01/13/2017 06:46   Ct Head Wo Contrast  Result Date: 01/13/2017 CLINICAL DATA:  Fall EXAM: CT HEAD WITHOUT CONTRAST TECHNIQUE: Contiguous axial images were obtained from the base of the skull through the vertex without intravenous contrast. COMPARISON:  None. FINDINGS: Brain: No mass lesion, intraparenchymal hemorrhage or extra-axial collection. No evidence of acute cortical infarct. There is periventricular hypoattenuation compatible with chronic microvascular disease. Vascular: No hyperdense vessel or unexpected  calcification. Skull: Normal visualized skull base, calvarium and extracranial soft tissues. Sinuses/Orbits: No sinus fluid levels or advanced mucosal thickening. No mastoid effusion. Normal orbits. IMPRESSION: No acute intracranial abnormality. Electronically Signed   By: Ulyses Jarred M.D.   On: 01/13/2017 04:44    Procedures Procedures (including critical care time)  Medications Ordered in ED Medications  HYDROcodone-acetaminophen (NORCO/VICODIN) 5-325 MG per tablet 1 tablet (1 tablet Oral Given 01/13/17 0458)  lidocaine (PF) (XYLOCAINE)  1 % injection 5 mL (5 mLs Infiltration Given 01/13/17 0458)     Initial Impression / Assessment and Plan / ED Course  I have reviewed the triage vital signs and the nursing notes.  Pertinent labs & imaging results that were available during my care of the patient were reviewed by me and considered in my medical decision making (see chart for details).   Abd exam benign.  Doubt internal injury.  Xrays negative.  No fx.  No head injury.  Laceration repaired.  OTC pain meds as needed.  Follow up with pcp for suture removal.  Final Clinical Impressions(s) / ED Diagnoses   Final diagnoses:  Laceration of right hand without foreign body, initial encounter  Contusion of abdominal wall, initial encounter  Lumbar contusion, initial encounter    New Prescriptions New Prescriptions   No medications on file     Dorie Rank, MD 01/13/17 [id number]

## 2017-01-13 NOTE — Discharge Instructions (Signed)
Suture removal in 10 days, apply antibiotic ointment to the wound daily, the bruise should resolve over the next few weeks, return for vomiting, worsening symptoms

## 2017-01-13 NOTE — ED Notes (Signed)
Wound irrigated with 278ml saline

## 2017-01-13 NOTE — ED Notes (Signed)
Patient presents stating she was giving her neighbors dog a treat and somehow fell hitting her head on the cement.  Approximate 1 1/2 inch skin tear to right wrist.  Bleeding controlled

## 2017-01-13 NOTE — ED Provider Notes (Signed)
..  Laceration Repair Date/Time: 01/13/2017 6:11 AM Performed by: Orson Aloe Authorized by: Dorie Rank   Consent:    Consent obtained:  Verbal   Consent given by:  Patient   Risks discussed:  Infection, pain and poor wound healing Anesthesia (see MAR for exact dosages):    Anesthesia method:  Local infiltration   Local anesthetic:  Lidocaine 1% w/o epi Laceration details:    Location:  Hand   Hand location:  R wrist   Length (cm):  2.5 Repair type:    Repair type:  Simple Pre-procedure details:    Preparation:  Patient was prepped and draped in usual sterile fashion and imaging obtained to evaluate for foreign bodies Treatment:    Area cleansed with:  Betadine and saline   Amount of cleaning:  Extensive   Irrigation solution:  Sterile saline Skin repair:    Repair method:  Sutures   Suture size:  4-0   Suture material:  Nylon   Suture technique:  Simple interrupted   Number of sutures:  5 Approximation:    Approximation:  Close   Vermilion border: well-aligned   Post-procedure details:    Dressing:  Antibiotic ointment and sterile dressing   Patient tolerance of procedure:  Tolerated well, no immediate complications       [place], [place] 01/13/17 [phone]

## 2017-01-13 NOTE — ED Triage Notes (Signed)
Pt states that she was taking her neighbors dog a treat and fell, small laceration to R hand, bruise to abdomen and hit head on cement, no LOC, is not on blood thinners.

## 2017-01-20 ENCOUNTER — Encounter (HOSPITAL_COMMUNITY): Payer: Self-pay

## 2017-01-20 ENCOUNTER — Emergency Department (HOSPITAL_COMMUNITY)
Admission: EM | Admit: 2017-01-20 | Discharge: 2017-01-20 | Disposition: A | Discharge status: Home / Self Care | Payer: PPO | Attending: Emergency Medicine | Admitting: Emergency Medicine

## 2017-01-20 DIAGNOSIS — Z4801 Encounter for change or removal of surgical wound dressing: Secondary | ICD-10-CM | POA: Diagnosis not present

## 2017-01-20 DIAGNOSIS — Z4800 Encounter for change or removal of nonsurgical wound dressing: Secondary | ICD-10-CM | POA: Diagnosis not present

## 2017-01-20 DIAGNOSIS — Z79899 Other long term (current) drug therapy: Secondary | ICD-10-CM | POA: Insufficient documentation

## 2017-01-20 DIAGNOSIS — Z8673 Personal history of transient ischemic attack (TIA), and cerebral infarction without residual deficits: Secondary | ICD-10-CM | POA: Insufficient documentation

## 2017-01-20 DIAGNOSIS — I130 Hypertensive heart and chronic kidney disease with heart failure and stage 1 through stage 4 chronic kidney disease, or unspecified chronic kidney disease: Secondary | ICD-10-CM | POA: Insufficient documentation

## 2017-01-20 DIAGNOSIS — N183 Chronic kidney disease, stage 3 (moderate): Secondary | ICD-10-CM | POA: Diagnosis not present

## 2017-01-20 DIAGNOSIS — I5032 Chronic diastolic (congestive) heart failure: Secondary | ICD-10-CM | POA: Insufficient documentation

## 2017-01-20 DIAGNOSIS — Z7982 Long term (current) use of aspirin: Secondary | ICD-10-CM | POA: Insufficient documentation

## 2017-01-20 DIAGNOSIS — Z5189 Encounter for other specified aftercare: Secondary | ICD-10-CM

## 2017-01-20 DIAGNOSIS — Z87891 Personal history of nicotine dependence: Secondary | ICD-10-CM | POA: Insufficient documentation

## 2017-01-20 DIAGNOSIS — Z96643 Presence of artificial hip joint, bilateral: Secondary | ICD-10-CM | POA: Diagnosis not present

## 2017-01-20 NOTE — Discharge Instructions (Signed)
Please read and follow all provided instructions.  Your diagnoses today include:  1. Visit for wound check     Tests performed today include: Vital signs. See below for your results today.   Medications prescribed:  Take as prescribed   Home care instructions:  Follow any educational materials contained in this packet.  Follow-up instructions: Please follow-up with your primary care provider for further evaluation of symptoms and treatment   Return instructions:  Please return to the Emergency Department if you do not get better, if you get worse, or new symptoms OR  - Fever (temperature greater than 101.71F)  - Bleeding that does not stop with holding pressure to the area    -Severe pain (please note that you may be more sore the day after your accident)  - Chest Pain  - Difficulty breathing  - Severe nausea or vomiting  - Inability to tolerate food and liquids  - Passing out  - Skin becoming red around your wounds  - Change in mental status (confusion or lethargy)  - New numbness or weakness    Please return if you have any other emergent concerns.  Additional Information:  Your vital signs today were: BP (!) 143/87    Pulse 78    Temp 98.8 F (37.1 C) (Oral)    Resp 16    SpO2 94%  If your blood pressure (BP) was elevated above 135/85 this visit, please have this repeated by your doctor within one month. ---------------

## 2017-01-20 NOTE — ED Notes (Signed)
See PA assessment 

## 2017-01-20 NOTE — ED Triage Notes (Addendum)
Pt here for suture removal.  Also, wants wound on right side of abd assessed.  Pt thinks the pit bull dog stepped on her abd and scratched her, bruising noted and in center there is a scab.

## 2017-01-20 NOTE — ED Provider Notes (Signed)
[facility] DEPT Provider Note   CSN: [id number] Arrival date & time: 01/20/17  1936   By signing my name below, I, [name], attest that this documentation has been prepared under the direction and in the presence of Shary Decamp, PA-C. Electronically Signed: Eunice Drake, Scribe. 01/20/17. 8:33 PM.   History   Chief Complaint Chief Complaint  Patient presents with  . Suture / Staple Removal   The history is provided by medical records, the patient and a caregiver. No language interpreter was used.    HPI Comments: [name] is a 76 y.o. female who presents to the Emergency Department for wound check. Pt seen on 01/13/2017 by Dorie Rank, MD who placed sutures to a 2.5 cm laceration on the R wrist. Scheduled suture removal for Tuesday with PCP. No swelling. No redness. No purulence. No complications from hand laceration. Pt's daughter also expresses concern for exacerbation of a prior puncture wound to the R side of the abdomen. She adds their dog may have scratched it, and triage notes bruising and scabbing to the reported area of concern. No redness. No purulence. No fevers. No other symptoms noted.   Past Medical History:  Diagnosis Date  . Chronic venous insufficiency   . Colon, diverticulosis Sept. 2011   outpatient colonoscopy by Dr. Cristina Gong.  Need record  . DDD (degenerative disc disease), lumbosacral   . Depression   . DJD (degenerative joint disease), multiple sites    Low back pain worst  . GERD (gastroesophageal reflux disease)   . Gout   . Gout    Uric Acid level 4.2 on 300 allopurinol  . History of cervical cancer 1972  . Hx-TIA (transient ischemic attack) 05/13/2001   Right facial numbness  . Hyperlipidemia   . Hypertension   . Memory disorder 12/20/2016  . Mitral valve prolapse 10/27/1999   Subsequent 2D echo show normal mitral valve  . Osteoarthritis of hip    bilateral hips  . Ovarian cancer ([place]) 1972   S/P oophorectomy  . Psychosis   .  Recurrent boils    History of MRSA skin infections with abscess  . Sensorineural hearing loss of both ears     Patient Active Problem List   Diagnosis Date Noted  . Memory disorder 12/20/2016  . History of lumbar laminectomy for spinal cord decompression 02/02/2016  . Rash and nonspecific skin eruption 07/09/2015  . Bereavement 12/01/2014  . Psychosis 12/01/2014  . Reactive airway disease 08/11/2014  . History of cancer 08/11/2014  . OSA (obstructive sleep apnea) 08/11/2014  . Healthcare maintenance 07/17/2013  . Chronic kidney disease (CKD), stage III (moderate) 10/04/2012  . Schizophrenia, chronic condition ([place]) 01/20/2012  . Postmenopausal atrophic vaginitis 11/01/2010  . Chronic diastolic heart failure ([place]) 05/06/2010  . Allergic rhinitis 10/14/2009  . Obesity, Class II, BMI 35-39.9, with comorbidity 05/14/2009  . Chronic prescription opiate use 04/29/2008  . Anemia 09/25/2007  . Hyperlipidemia 09/03/2006  . Gout 09/03/2006  . Essential hypertension 09/03/2006  . GERD 09/03/2006  . Hx-TIA (transient ischemic attack) 05/13/2001    Past Surgical History:  Procedure Laterality Date  . ABDOMINAL HYSTERECTOMY     1972  . CERVICAL DISCECTOMY  7/07   C5-C6  . JOINT REPLACEMENT     bilateral hip replacement  . LUMBAR DISC SURGERY     L5-S1 7/07  . LUMBAR LAMINECTOMY/DECOMPRESSION MICRODISCECTOMY N/A 02/02/2016   Procedure: L4-5 Decompression;  Surgeon: Marybelle Killings, MD;  Location: [facility];  Service: Orthopedics;  Laterality:  N/A;  . OOPHORECTOMY     1972 for ovarian cancer    OB History    No data available       Home Medications    Prior to Admission medications   Medication Sig Start Date End Date Taking? Authorizing Provider  allopurinol (ZYLOPRIM) 300 MG tablet TAKE 1 TABLET BY MOUTH DAILY 05/25/16   Bartholomew Crews, MD  amLODipine (NORVASC) 10 MG tablet TAKE 1 TABLET BY MOUTH EVERY DAY 05/25/16   Bartholomew Crews, MD  ascorbic acid (VITAMIN C) 500 MG  tablet Take 500 mg by mouth daily.    Historical Provider, MD  aspirin EC 81 MG tablet Take 81 mg by mouth daily.    Historical Provider, MD  atorvastatin (LIPITOR) 40 MG tablet TAKE 1 TABLET BY MOUTH EVERY DAY 06/27/16   Bartholomew Crews, MD  buPROPion (WELLBUTRIN XL) 300 MG 24 hr tablet Take 1 tablet (300 mg total) by mouth every morning. 11/28/16   Kathlee Nations, MD  Calcium Carbonate-Vitamin D (CALCIUM-VITAMIN D) 500-200 MG-UNIT per tablet Take 1 tablet by mouth daily.      Historical Provider, MD  diphenhydrAMINE (BENADRYL) 25 MG tablet Take 25 mg by mouth every 6 (six) hours as needed.    Historical Provider, MD  docusate sodium (COLACE) 100 MG capsule Take 1 capsule (100 mg total) by mouth every 12 (twelve) hours. 12/21/13   Elnora Morrison, MD  fluocinonide cream (LIDEX) 0.05 % APPLY TO AFFECTED AREA TWICE DAILY 11/02/16   Bartholomew Crews, MD  fluticasone [facility]) 50 MCG/ACT nasal spray INSTILL 2 SPRAYS IN EACH NOSTRIL DAILY 01/01/17   Bartholomew Crews, MD  furosemide (LASIX) 20 MG tablet TAKE ONE TABLET BY MOUTH DAILY. *MAY TAKE AN EXTRA ONE AS NEEDED ONCE DAILY IF SWELLING OR EDEMA IS BAD* 02/28/16   Bartholomew Crews, MD  hydrOXYzine (VISTARIL) 25 MG capsule Take 1 capsule (25 mg total) by mouth at bedtime. 11/28/16   Kathlee Nations, MD  losartan (COZAAR) 100 MG tablet TAKE ONE TABLET BY MOUTH DAILY 02/28/16   Bartholomew Crews, MD  Multiple Vitamins-Minerals (ALIVE WOMENS ENERGY) TABS Take by mouth.    Historical Provider, MD  [place]    Historical Provider, MD  oxybutynin (DITROPAN-XL) 5 MG 24 hr tablet TAKE ONE TABLET BY MOUTH AT BEDTIME 05/25/16   Bartholomew Crews, MD  pantoprazole (PROTONIX) 40 MG tablet TAKE 1 TABLET BY MOUTH TWICE DAILY BEFORE A MEAL 05/25/16   Bartholomew Crews, MD  potassium chloride (MICRO-K) 10 MEQ CR capsule TAKE 1 CAPSULE BY MOUTH DAILY 05/25/16   Bartholomew Crews, MD  senna (SENOKOT) 8.6 MG TABS tablet Take 1 tablet by  mouth every other day.    Historical Provider, MD  traMADol (ULTRAM) 50 MG tablet TAKE ONE TABLET BY MOUTH TWO TIMES DAILY AS NEEDED. Patient not taking: Reported on 01/04/2017 01/02/17   Bartholomew Crews, MD    Family History Family History  Problem Relation Age of Onset  . Diabetes Mother   . Heart disease Mother   . Hearing loss Mother   . Stroke Mother   . Post-traumatic stress disorder Father   . Schizophrenia Maternal Grandmother   . Diabetes Maternal Grandfather   . Other Brother     brother died in mental health hospital    Social History Social History  Substance Use Topics  . Smoking status: Former Smoker    Packs/day: 0.10    Types:  Cigarettes    Quit date: 04/08/2013  . Smokeless tobacco: Never Used  . Alcohol use No     Allergies   Aspirin and Sulfamethoxazole-trimethoprim   Review of Systems Review of Systems  Constitutional: Negative for chills and fever.  Skin: Positive for color change and wound.  Allergic/Immunologic: Negative for immunocompromised state.  Neurological: Negative for weakness and numbness.     Physical Exam Updated Vital Signs BP (!) 143/87   Pulse 78   Temp 98.8 F (37.1 C) (Oral)   Resp 16   SpO2 94%   Physical Exam  Constitutional: She is oriented to person, place, and time. Vital signs are normal. She appears well-developed and well-nourished.  HENT:  Head: Normocephalic and atraumatic.  Right Ear: Hearing normal.  Left Ear: Hearing normal.  Eyes: Conjunctivae and EOM are normal. Pupils are equal, round, and reactive to light. Right eye exhibits no discharge. Left eye exhibits no discharge. No scleral icterus.  Neck: Normal range of motion. No JVD present. No tracheal deviation present.  Cardiovascular: Normal rate and regular rhythm.   Pulmonary/Chest: Effort normal. No stridor.  Abdominal:  Small puncture wound to right abdomen. No signs of infection. No erythema. No swelling. No drainage or purulence. No  fluctuance.    Neurological: She is alert and oriented to person, place, and time. Coordination normal.  Skin: Skin is warm and dry.  Psychiatric: She has a normal mood and affect. Her speech is normal and behavior is normal. Judgment and thought content normal.  Nursing note and vitals reviewed.  ED Treatments / Results  DIAGNOSTIC STUDIES: Oxygen Saturation is 94% on RA, low by my interpretation.    COORDINATION OF CARE: 8:33 PM Discussed treatment plan with pt at bedside and pt agreed to plan.  Labs (all labs ordered are listed, but only abnormal results are displayed) Labs Reviewed - No data to display  EKG  EKG Interpretation None       Radiology No results found.  Procedures Procedures (including critical care time)  Medications Ordered in ED Medications - No data to display   Initial Impression / Assessment and Plan / ED Course  I have reviewed the triage vital signs and the nursing notes.  Pertinent labs & imaging results that were available during my care of the patient were reviewed by me and considered in my medical decision making (see chart for details).  Final Clinical Impressions(s) / ED Diagnoses     {I have reviewed the relevant previous healthcare records.  {I obtained HPI from historian.   ED Course:  Assessment: Pt seen on 01/13/2017 by Dorie Rank, MD who placed sutures to a 2.5 cm laceration on the R wrist. Scheduled suture removal for Tuesday with PCP. No swelling. No redness. No purulence. No complications from hand laceration. Pt's daughter also expresses concern for exacerbation of a prior puncture wound to the R side of the abdomen. She adds their dog may have scratched it, and triage notes bruising and scabbing to the reported area of concern. No redness. No purulence. No fevers. Wound is healing well as appropriate. Pt has follow up on Tuesday with PCP for suture removal. At time of discharge, Patient is in no acute distress. Vital Signs are  stable. Patient is able to ambulate. Patient able to tolerate PO.   Disposition/Plan:  DC Home Additional Verbal discharge instructions given and discussed with patient.  Pt Instructed to f/u with PCP in the next week for evaluation and treatment of symptoms. Return  precautions given Pt acknowledges and agrees with plan  Supervising Physician [name], MD  Final diagnoses:  Visit for wound check    New Prescriptions New Prescriptions   No medications on file   I personally performed the services described in this documentation, which was scribed in my presence. The recorded information has been reviewed and is accurate.    Shary Decamp, PA-C 01/20/17 2057    [name], MD 01/21/17 [phone]

## 2017-01-20 NOTE — ED Notes (Signed)
Pt stable, ambulatory, states understanding of discharge instructions 

## 2017-01-23 ENCOUNTER — Encounter (HOSPITAL_COMMUNITY): Payer: Self-pay | Admitting: Emergency Medicine

## 2017-01-23 ENCOUNTER — Emergency Department (HOSPITAL_COMMUNITY)
Admission: EM | Admit: 2017-01-23 | Discharge: 2017-01-23 | Disposition: A | Discharge status: Home / Self Care | Payer: PPO | Attending: Emergency Medicine | Admitting: Emergency Medicine

## 2017-01-23 DIAGNOSIS — Z4802 Encounter for removal of sutures: Secondary | ICD-10-CM | POA: Diagnosis not present

## 2017-01-23 DIAGNOSIS — Z8673 Personal history of transient ischemic attack (TIA), and cerebral infarction without residual deficits: Secondary | ICD-10-CM | POA: Diagnosis not present

## 2017-01-23 DIAGNOSIS — I129 Hypertensive chronic kidney disease with stage 1 through stage 4 chronic kidney disease, or unspecified chronic kidney disease: Secondary | ICD-10-CM | POA: Diagnosis not present

## 2017-01-23 DIAGNOSIS — Z7982 Long term (current) use of aspirin: Secondary | ICD-10-CM | POA: Diagnosis not present

## 2017-01-23 DIAGNOSIS — Z87891 Personal history of nicotine dependence: Secondary | ICD-10-CM | POA: Insufficient documentation

## 2017-01-23 DIAGNOSIS — J45909 Unspecified asthma, uncomplicated: Secondary | ICD-10-CM | POA: Diagnosis not present

## 2017-01-23 DIAGNOSIS — Z8543 Personal history of malignant neoplasm of ovary: Secondary | ICD-10-CM | POA: Insufficient documentation

## 2017-01-23 DIAGNOSIS — Z8541 Personal history of malignant neoplasm of cervix uteri: Secondary | ICD-10-CM | POA: Diagnosis not present

## 2017-01-23 DIAGNOSIS — Z79899 Other long term (current) drug therapy: Secondary | ICD-10-CM | POA: Diagnosis not present

## 2017-01-23 DIAGNOSIS — N183 Chronic kidney disease, stage 3 (moderate): Secondary | ICD-10-CM | POA: Diagnosis not present

## 2017-01-23 DIAGNOSIS — Z96643 Presence of artificial hip joint, bilateral: Secondary | ICD-10-CM | POA: Diagnosis not present

## 2017-01-23 DIAGNOSIS — Z4801 Encounter for change or removal of surgical wound dressing: Secondary | ICD-10-CM | POA: Diagnosis not present

## 2017-01-23 NOTE — ED Notes (Signed)
Bed: WTR5 Expected date:  Expected time:  Means of arrival:  Comments: 

## 2017-01-23 NOTE — ED Provider Notes (Signed)
[facility] DEPT Provider Note   By signing my name below, I, Bea Graff, attest that this documentation has been prepared under the direction and in the presence of Charlann Lange, PA-C. Electronically Signed: Bea Graff, ED Scribe. 01/23/17. 8:26 PM.    History   Chief Complaint Chief Complaint  Patient presents with  . Suture / Staple Removal   The history is provided by the patient and medical records. No language interpreter was used.    [name] is a 76 y.o. female who presents to the Emergency Department needing 5 sutures removed from the right wrist that were placed ten days ago. She states she was cut with a dog's claw. She reports associated mild pain. She has not taken anything for pain relief. There are no modifying factors noted. She denies drainage, fever, chills, nausea, vomiting, red streaking, numbness, tingling or weakness.   Past Medical History:  Diagnosis Date  . Chronic venous insufficiency   . Colon, diverticulosis Sept. 2011   outpatient colonoscopy by Dr. Cristina Gong.  Need record  . DDD (degenerative disc disease), lumbosacral   . Depression   . DJD (degenerative joint disease), multiple sites    Low back pain worst  . GERD (gastroesophageal reflux disease)   . Gout   . Gout    Uric Acid level 4.2 on 300 allopurinol  . History of cervical cancer 1972  . Hx-TIA (transient ischemic attack) 05/13/2001   Right facial numbness  . Hyperlipidemia   . Hypertension   . Memory disorder 12/20/2016  . Mitral valve prolapse 10/27/1999   Subsequent 2D echo show normal mitral valve  . Osteoarthritis of hip    bilateral hips  . Ovarian cancer ([place]) 1972   S/P oophorectomy  . Psychosis   . Recurrent boils    History of MRSA skin infections with abscess  . Sensorineural hearing loss of both ears     Patient Active Problem List   Diagnosis Date Noted  . Memory disorder 12/20/2016  . History of lumbar laminectomy for spinal cord  decompression 02/02/2016  . Rash and nonspecific skin eruption 07/09/2015  . Bereavement 12/01/2014  . Psychosis 12/01/2014  . Reactive airway disease 08/11/2014  . History of cancer 08/11/2014  . OSA (obstructive sleep apnea) 08/11/2014  . Healthcare maintenance 07/17/2013  . Chronic kidney disease (CKD), stage III (moderate) 10/04/2012  . Schizophrenia, chronic condition ([place]) 01/20/2012  . Postmenopausal atrophic vaginitis 11/01/2010  . Chronic diastolic heart failure ([place]) 05/06/2010  . Allergic rhinitis 10/14/2009  . Obesity, Class II, BMI 35-39.9, with comorbidity 05/14/2009  . Chronic prescription opiate use 04/29/2008  . Anemia 09/25/2007  . Hyperlipidemia 09/03/2006  . Gout 09/03/2006  . Essential hypertension 09/03/2006  . GERD 09/03/2006  . Hx-TIA (transient ischemic attack) 05/13/2001    Past Surgical History:  Procedure Laterality Date  . ABDOMINAL HYSTERECTOMY     1972  . CERVICAL DISCECTOMY  7/07   C5-C6  . JOINT REPLACEMENT     bilateral hip replacement  . LUMBAR DISC SURGERY     L5-S1 7/07  . LUMBAR LAMINECTOMY/DECOMPRESSION MICRODISCECTOMY N/A 02/02/2016   Procedure: L4-5 Decompression;  Surgeon: Marybelle Killings, MD;  Location: [facility];  Service: Orthopedics;  Laterality: N/A;  . [place] for ovarian cancer    OB History    No data available       Home Medications    Prior to Admission medications   Medication Sig Start Date End Date Taking? Authorizing  Provider  allopurinol (ZYLOPRIM) 300 MG tablet TAKE 1 TABLET BY MOUTH DAILY 05/25/16   Bartholomew Crews, MD  amLODipine (NORVASC) 10 MG tablet TAKE 1 TABLET BY MOUTH EVERY DAY 05/25/16   Bartholomew Crews, MD  ascorbic acid (VITAMIN C) 500 MG tablet Take 500 mg by mouth daily.    Historical Provider, MD  aspirin EC 81 MG tablet Take 81 mg by mouth daily.    Historical Provider, MD  atorvastatin (LIPITOR) 40 MG tablet TAKE 1 TABLET BY MOUTH EVERY DAY 06/27/16   Bartholomew Crews, MD    buPROPion (WELLBUTRIN XL) 300 MG 24 hr tablet Take 1 tablet (300 mg total) by mouth every morning. 11/28/16   Kathlee Nations, MD  Calcium Carbonate-Vitamin D (CALCIUM-VITAMIN D) 500-200 MG-UNIT per tablet Take 1 tablet by mouth daily.      Historical Provider, MD  diphenhydrAMINE (BENADRYL) 25 MG tablet Take 25 mg by mouth every 6 (six) hours as needed.    Historical Provider, MD  docusate sodium (COLACE) 100 MG capsule Take 1 capsule (100 mg total) by mouth every 12 (twelve) hours. 12/21/13   Elnora Morrison, MD  fluocinonide cream (LIDEX) 0.05 % APPLY TO AFFECTED AREA TWICE DAILY 11/02/16   Bartholomew Crews, MD  fluticasone [facility]) 50 MCG/ACT nasal spray INSTILL 2 SPRAYS IN EACH NOSTRIL DAILY 01/01/17   Bartholomew Crews, MD  furosemide (LASIX) 20 MG tablet TAKE ONE TABLET BY MOUTH DAILY. MAY TAKE AN EXTRA ONE AS NEEDED ONCE DAILY IF SWELLING OR EDEMA IS BAD 02/28/16   Bartholomew Crews, MD  hydrOXYzine (VISTARIL) 25 MG capsule Take 1 capsule (25 mg total) by mouth at bedtime. 11/28/16   Kathlee Nations, MD  losartan (COZAAR) 100 MG tablet TAKE ONE TABLET BY MOUTH DAILY 02/28/16   Bartholomew Crews, MD  Multiple Vitamins-Minerals (ALIVE WOMENS ENERGY) TABS Take by mouth.    Historical Provider, MD  [place]    Historical Provider, MD  oxybutynin (DITROPAN-XL) 5 MG 24 hr tablet TAKE ONE TABLET BY MOUTH AT BEDTIME 05/25/16   Bartholomew Crews, MD  pantoprazole (PROTONIX) 40 MG tablet TAKE 1 TABLET BY MOUTH TWICE DAILY BEFORE A MEAL 05/25/16   Bartholomew Crews, MD  potassium chloride (MICRO-K) 10 MEQ CR capsule TAKE 1 CAPSULE BY MOUTH DAILY 05/25/16   Bartholomew Crews, MD  senna (SENOKOT) 8.6 MG TABS tablet Take 1 tablet by mouth every other day.    Historical Provider, MD  traMADol (ULTRAM) 50 MG tablet TAKE ONE TABLET BY MOUTH TWO TIMES DAILY AS NEEDED. Patient not taking: Reported on 01/04/2017 01/02/17   Bartholomew Crews, MD    Family History Family History   Problem Relation Age of Onset  . Diabetes Mother   . Heart disease Mother   . Hearing loss Mother   . Stroke Mother   . Post-traumatic stress disorder Father   . Schizophrenia Maternal Grandmother   . Diabetes Maternal Grandfather   . Other Brother     brother died in mental health hospital    Social History Social History  Substance Use Topics  . Smoking status: Former Smoker    Packs/day: 0.10    Types: Cigarettes    Quit date: 04/08/2013  . Smokeless tobacco: Never Used  . Alcohol use No     Allergies   Aspirin and Sulfamethoxazole-trimethoprim   Review of Systems Review of Systems  Constitutional: Negative for chills and fever.  Gastrointestinal: Negative for  nausea and vomiting.  Skin: Positive for wound. Negative for color change.  Neurological: Negative for weakness and numbness.     Physical Exam Updated Vital Signs BP (!) 129/96 (BP Location: Left Arm)   Pulse 71 Comment: occasional irregular beats palpated  Temp 98.2 F (36.8 C) (Oral)   Resp 20   SpO2 95%   Physical Exam  Constitutional: She is oriented to person, place, and time. She appears well-developed and well-nourished.  HENT:  Head: Normocephalic and atraumatic.  Neck: Normal range of motion.  Cardiovascular: Normal rate.   Pulmonary/Chest: Effort normal.  Musculoskeletal: Normal range of motion.  Well healed laceration to the dorsal base of the right thumb. 5 intact sutures. No wound dehiscence.  Neurological: She is alert and oriented to person, place, and time.  Skin: Skin is warm and dry.  Psychiatric: She has a normal mood and affect. Her behavior is normal.  Nursing note and vitals reviewed.    ED Treatments / Results  DIAGNOSTIC STUDIES: Oxygen Saturation is 95% on RA, adequate by my interpretation.   COORDINATION OF CARE: 8:22 PM- Will remove sutures. Pt verbalizes understanding and agrees to plan.  Medications - No data to display   Labs (all labs ordered are  listed, but only abnormal results are displayed) Labs Reviewed - No data to display  EKG  EKG Interpretation None       Radiology No results found.  Procedures .Suture Removal Date/Time: 01/23/2017 8:22 PM Performed by: Charlann Lange Authorized by: Charlann Lange   Consent:    Consent obtained:  Verbal   Consent given by:  Patient   Risks discussed:  Pain Location:    Location:  Upper extremity   Upper extremity location:  Wrist   Wrist location:  R wrist Procedure details:    Wound appearance:  No signs of infection, good wound healing, nontender and clean   Number of sutures removed:  5 Post-procedure details:    Post-removal:  Antibiotic ointment applied and dressing applied   Patient tolerance of procedure:  Tolerated well, no immediate complications   (including critical care time)  Medications Ordered in ED Medications - No data to display   Initial Impression / Assessment and Plan / ED Course  I have reviewed the triage vital signs and the nursing notes.  Pertinent labs & imaging results that were available during my care of the patient were reviewed by me and considered in my medical decision making (see chart for details).     Patient here for suture removal. Well healed laceration. Sutures removed without difficulty.  I personally performed the services described in this documentation, which was scribed in my presence. The recorded information has been reviewed and is accurate.   Final Clinical Impressions(s) / ED Diagnoses   Final diagnoses:  None   1. Suture removal  New Prescriptions New Prescriptions   No medications on file     Charlann Lange, Hershal Coria 01/25/17 0119    Charlesetta Shanks, MD 01/28/17 [phone]

## 2017-01-25 ENCOUNTER — Ambulatory Visit
Admission: RE | Admit: 2017-01-25 | Discharge: 2017-01-25 | Disposition: A | Discharge status: Home / Self Care | Payer: PPO | Source: Ambulatory Visit | Attending: Neurology | Admitting: Neurology

## 2017-01-25 ENCOUNTER — Other Ambulatory Visit: Payer: Self-pay | Admitting: Internal Medicine

## 2017-01-25 ENCOUNTER — Other Ambulatory Visit: Payer: Self-pay

## 2017-01-25 ENCOUNTER — Telehealth: Payer: Self-pay | Admitting: Neurology

## 2017-01-25 ENCOUNTER — Other Ambulatory Visit (HOSPITAL_COMMUNITY): Payer: Self-pay | Admitting: Psychiatry

## 2017-01-25 DIAGNOSIS — Z7982 Long term (current) use of aspirin: Secondary | ICD-10-CM | POA: Diagnosis not present

## 2017-01-25 DIAGNOSIS — R410 Disorientation, unspecified: Secondary | ICD-10-CM | POA: Diagnosis not present

## 2017-01-25 DIAGNOSIS — Z8673 Personal history of transient ischemic attack (TIA), and cerebral infarction without residual deficits: Secondary | ICD-10-CM | POA: Insufficient documentation

## 2017-01-25 DIAGNOSIS — F200 Paranoid schizophrenia: Secondary | ICD-10-CM

## 2017-01-25 DIAGNOSIS — R413 Other amnesia: Secondary | ICD-10-CM

## 2017-01-25 DIAGNOSIS — Z5321 Procedure and treatment not carried out due to patient leaving prior to being seen by health care provider: Secondary | ICD-10-CM | POA: Insufficient documentation

## 2017-01-25 DIAGNOSIS — M545 Low back pain, unspecified: Secondary | ICD-10-CM

## 2017-01-25 DIAGNOSIS — I10 Essential (primary) hypertension: Secondary | ICD-10-CM | POA: Insufficient documentation

## 2017-01-25 DIAGNOSIS — Z87891 Personal history of nicotine dependence: Secondary | ICD-10-CM | POA: Diagnosis not present

## 2017-01-25 DIAGNOSIS — Z8543 Personal history of malignant neoplasm of ovary: Secondary | ICD-10-CM | POA: Diagnosis not present

## 2017-01-25 DIAGNOSIS — Z96698 Presence of other orthopedic joint implants: Secondary | ICD-10-CM | POA: Diagnosis not present

## 2017-01-25 DIAGNOSIS — R930 Abnormal findings on diagnostic imaging of skull and head, not elsewhere classified: Secondary | ICD-10-CM | POA: Insufficient documentation

## 2017-01-25 NOTE — Telephone Encounter (Signed)
I got a call from [facility] about abnormal MRI on this patient. Upon review she has bilateral convexity left greater than right subdural hematoma mostly chronic and interstingly CT head from 1 week ago shows csf density collections over convexity but much smaller suggesting likley chronic and subacute age collections.I spoke to Dr Jola Baptist neuroradiologist who informed me that he spoke to patient`s daughter who informed him patient was getting more confused and offbalance. I agree with patient going to ER for evaluation and neurosurgery opinion.I spoke to patient`s daughter  After I called patient`s home numberand reiterated the plan and she voiced understanding

## 2017-01-26 ENCOUNTER — Telehealth: Payer: Self-pay | Admitting: Neurology

## 2017-01-26 ENCOUNTER — Emergency Department (HOSPITAL_COMMUNITY)
Admission: EM | Admit: 2017-01-26 | Discharge: 2017-01-26 | Disposition: A | Discharge status: Home / Self Care | Payer: PPO | Attending: Dermatology | Admitting: Dermatology

## 2017-01-26 ENCOUNTER — Encounter: Payer: Self-pay | Admitting: Neurology

## 2017-01-26 ENCOUNTER — Encounter (HOSPITAL_COMMUNITY): Payer: Self-pay

## 2017-01-26 ENCOUNTER — Ambulatory Visit (INDEPENDENT_AMBULATORY_CARE_PROVIDER_SITE_OTHER): Payer: PPO | Admitting: Neurology

## 2017-01-26 VITALS — BP 138/69 | HR 78 | Ht 63.0 in | Wt 197.5 lb

## 2017-01-26 DIAGNOSIS — S065X9A Traumatic subdural hemorrhage with loss of consciousness of unspecified duration, initial encounter: Secondary | ICD-10-CM

## 2017-01-26 DIAGNOSIS — R413 Other amnesia: Secondary | ICD-10-CM | POA: Diagnosis not present

## 2017-01-26 DIAGNOSIS — I6200 Nontraumatic subdural hemorrhage, unspecified: Secondary | ICD-10-CM

## 2017-01-26 DIAGNOSIS — S065XAA Traumatic subdural hemorrhage with loss of consciousness status unknown, initial encounter: Secondary | ICD-10-CM

## 2017-01-26 HISTORY — DX: Traumatic subdural hemorrhage with loss of consciousness status unknown, initial encounter: S06.5XAA

## 2017-01-26 HISTORY — DX: Traumatic subdural hemorrhage with loss of consciousness of unspecified duration, initial encounter: S06.5X9A

## 2017-01-26 MED ORDER — HYDROCODONE-ACETAMINOPHEN 5-325 MG PO TABS: 1.0000 | ORAL_TABLET | Freq: Four times a day (QID) | ORAL | 0 refills | Status: DC | PRN

## 2017-01-26 NOTE — ED Triage Notes (Signed)
Pt was sent here by doctor, pt had an abnormal MRI and told by her doctor that she would be admitted here. Pts MRI shows chronic subdural hematoma. Note states to be evaluated by EDP and possible neuro consult. Pt only c/o of back pain

## 2017-01-26 NOTE — Telephone Encounter (Signed)
  I called the patient, talk with the family. The patient does have bilateral subdural hematomas, they do not appear to be surgical at this time, but will need to be followed. We will need to check another scan in about 10 days or 2 weeks, we will also include a CT of the cervical spine with this. The patient will come in to the office today for a quick evaluation, the family believes that she has back to her usual baseline following the fall on March 17.  MRI brain 01/25/17:  IMPRESSION: BILATERAL extra-axial subacute subdural hematomas have developed since 01/13/2017, when the patient sustained a fall. Thickness is up to 7 mm RIGHT, and 8 mm LEFT. Mild flattening of both cerebral hemispheres.  The findings were relayed to the patient's daughter by myself, with instructions for the patient to proceed to the [facility] emergency department.

## 2017-01-26 NOTE — Patient Instructions (Signed)
   We will get CT of the brain in about 10 days. Stop the aspirin until further notice.  Go to the ER for evaluation if severe headache, confusion, or a decline in functional level is noted.

## 2017-01-26 NOTE — Telephone Encounter (Signed)
Placed pt on schedule today per CW,MD request

## 2017-01-26 NOTE — Progress Notes (Signed)
Reason for visit: Subdural hematoma  [name] is an 76 y.o. female  History of present illness:  [name] is a 76 year old right-handed black female with a history of a mild memory disorder. The patient fell on 01/13/2017 when she was trying to feed a dog. She tripped over the dog, fell backwards and struck the back of her head, and hurt her low back as well. X-ray of the low back shows degenerative arthritis, but no fractures. The initial CT of the brain was unremarkable. The patient was set up for MRI of the brain for evaluation of memory problems before she had the fall, the MRI was done yesterday, and showed evidence of bilateral subdural hematoma with chronic and subacute features. The patient is on low-dose aspirin. The patient did note headache for several days after the original fall, but headache has disappeared at this time. She continues to note significant back pain around the coccyx area, she has pain when trying to have a bowel movement. Sitting induces significant pain. She denies pain down either leg. She is taking Ultram for this discomfort, but this has not been extremely helpful.  Past Medical History:  Diagnosis Date  . Chronic venous insufficiency   . Colon, diverticulosis Sept. 2011   outpatient colonoscopy by Dr. Cristina Gong.  Need record  . DDD (degenerative disc disease), lumbosacral   . Depression   . DJD (degenerative joint disease), multiple sites    Low back pain worst  . GERD (gastroesophageal reflux disease)   . Gout   . Gout    Uric Acid level 4.2 on 300 allopurinol  . History of cervical cancer 1972  . Hx-TIA (transient ischemic attack) 05/13/2001   Right facial numbness  . Hyperlipidemia   . Hypertension   . Memory disorder 12/20/2016  . Mitral valve prolapse 10/27/1999   Subsequent 2D echo show normal mitral valve  . Osteoarthritis of hip    bilateral hips  . Ovarian cancer ([place]) 1972   S/P oophorectomy  . Psychosis   . Recurrent boils     History of MRSA skin infections with abscess  . Sensorineural hearing loss of both ears   . Subdural hematoma ([place]) 01/26/2017   bilateral    Past Surgical History:  Procedure Laterality Date  . ABDOMINAL HYSTERECTOMY     1972  . CERVICAL DISCECTOMY  7/07   C5-C6  . JOINT REPLACEMENT     bilateral hip replacement  . LUMBAR DISC SURGERY     L5-S1 7/07  . LUMBAR LAMINECTOMY/DECOMPRESSION MICRODISCECTOMY N/A 02/02/2016   Procedure: L4-5 Decompression;  Surgeon: Marybelle Killings, MD;  Location: [facility];  Service: Orthopedics;  Laterality: N/A;  . [place] for ovarian cancer    Family History  Problem Relation Age of Onset  . Diabetes Mother   . Heart disease Mother   . Hearing loss Mother   . Stroke Mother   . Post-traumatic stress disorder Father   . Schizophrenia Maternal Grandmother   . Diabetes Maternal Grandfather   . Other Brother     brother died in mental health hospital    Social history:  reports that she quit smoking about 3 years ago. Her smoking use included Cigarettes. She smoked 0.10 packs per day. She has never used smokeless tobacco. She reports that she does not drink alcohol or use drugs.    Allergies  Allergen Reactions  . Aspirin Other (See Comments)    bleeding  . Sulfamethoxazole-Trimethoprim  Itching    Medications:  Prior to Admission medications   Medication Sig Start Date End Date Taking? Authorizing Provider  allopurinol (ZYLOPRIM) 300 MG tablet TAKE 1 TABLET BY MOUTH DAILY 05/25/16  Yes Bartholomew Crews, MD  amLODipine (NORVASC) 10 MG tablet TAKE 1 TABLET BY MOUTH EVERY DAY 05/25/16  Yes Bartholomew Crews, MD  ascorbic acid (VITAMIN C) 500 MG tablet Take 500 mg by mouth daily.   Yes Historical Provider, MD  aspirin EC 81 MG tablet Take 81 mg by mouth daily.   Yes Historical Provider, MD  atorvastatin (LIPITOR) 40 MG tablet TAKE 1 TABLET BY MOUTH EVERY DAY 06/27/16  Yes Bartholomew Crews, MD  buPROPion (WELLBUTRIN XL) 300 MG 24 hr  tablet Take 1 tablet (300 mg total) by mouth every morning. 11/28/16  Yes Kathlee Nations, MD  Calcium Carbonate-Vitamin D (CALCIUM-VITAMIN D) 500-200 MG-UNIT per tablet Take 1 tablet by mouth daily.     Yes Historical Provider, MD  diphenhydrAMINE (BENADRYL) 25 MG tablet Take 25 mg by mouth every 6 (six) hours as needed.   Yes Historical Provider, MD  docusate sodium (COLACE) 100 MG capsule Take 1 capsule (100 mg total) by mouth every 12 (twelve) hours. 12/21/13  Yes Elnora Morrison, MD  fluocinonide cream (LIDEX) 0.05 % APPLY TO AFFECTED AREA TWICE DAILY 11/02/16  Yes Bartholomew Crews, MD  fluticasone [facility]) 50 MCG/ACT nasal spray INSTILL 2 SPRAYS IN EACH NOSTRIL DAILY 01/01/17  Yes Bartholomew Crews, MD  furosemide (LASIX) 20 MG tablet TAKE ONE TABLET BY MOUTH DAILY. *MAY TAKE AN EXTRA ONE AS NEEDED ONCE DAILY IF SWELLING OR EDEMA IS BAD* 02/28/16  Yes Bartholomew Crews, MD  hydrOXYzine (VISTARIL) 25 MG capsule Take 1 capsule (25 mg total) by mouth at bedtime. 11/28/16  Yes Kathlee Nations, MD  losartan (COZAAR) 100 MG tablet TAKE ONE TABLET BY MOUTH DAILY 02/28/16  Yes Bartholomew Crews, MD  Multiple Vitamins-Minerals (ALIVE WOMENS ENERGY) TABS Take by mouth.   Yes Historical Provider, MD  OVER THE COUNTER MEDICATION calciboost   Yes Historical Provider, MD  oxybutynin (DITROPAN-XL) 5 MG 24 hr tablet TAKE ONE TABLET BY MOUTH AT BEDTIME 05/25/16  Yes Bartholomew Crews, MD  pantoprazole (PROTONIX) 40 MG tablet TAKE 1 TABLET BY MOUTH TWICE DAILY BEFORE A MEAL 05/25/16  Yes Bartholomew Crews, MD  potassium chloride (MICRO-K) 10 MEQ CR capsule TAKE 1 CAPSULE BY MOUTH DAILY 05/25/16  Yes Bartholomew Crews, MD  senna (SENOKOT) 8.6 MG TABS tablet Take 1 tablet by mouth every other day.   Yes Historical Provider, MD  traMADol (ULTRAM) 50 MG tablet TAKE ONE TABLET BY MOUTH TWO TIMES DAILY AS NEEDED. 01/02/17  Yes Bartholomew Crews, MD  HYDROcodone-acetaminophen (NORCO/VICODIN) 5-325 MG tablet Take 1 tablet  by mouth every 6 (six) hours as needed for moderate pain. 01/26/17   Kathrynn Ducking, MD    ROS:  Out of a complete 14 system review of symptoms, the patient complains only of the following symptoms, and all other reviewed systems are negative.  Back pain  Blood pressure 138/69, pulse 78, height 5\' 3"  (1.6 m), weight 197 lb 8 oz (89.6 kg).  Physical Exam  General: The patient is alert and cooperative at the time of the examination. The patient is moderately obese.  Skin: No significant peripheral edema is noted.   Neurologic Exam  Mental status: The patient is alert and oriented x 3 at the time of the examination.  The patient has apparent normal recent and remote memory, with an apparently normal attention span and concentration ability. Mini-Mental Status Examination done today shows a total score 29/30.   Cranial nerves: Facial symmetry is present. Speech is normal, no aphasia or dysarthria is noted. Extraocular movements are full. Visual fields are full.  Motor: The patient has good strength in all 4 extremities.  Sensory examination: Soft touch sensation is symmetric on the face, arms, and legs.  Coordination: The patient has good finger-nose-finger and heel-to-shin bilaterally.  Gait and station: The patient has a slightly wide-based gait, the patient walks with a cane. Tandem gait was not attempted. Romberg is negative. No drift is seen.  Reflexes: Deep tendon reflexes are symmetric.   Assessment/Plan:  1. Mild memory disturbance  2. Recent fall, bilateral subdural hematoma  The patient does not have a surgically amenable problems currently, we will need to follow the subdural hematoma over time, I will plan on rechecking a CT scan of the brain and a CT of the cervical spine in 10 days. The patient is to go to the emergency room if any decline in functional level is noted or any severe increase in headache. Hydrocodone was given as a one-time prescription for the low  back pain, the patient may have fractured the coccyx. The patient will follow-up otherwise for her usual scheduled revisit.  Jill Alexanders MD 01/26/2017 1:20 PM  [facility] [address] [place], [place] [address]  Phone [phone] Fax [phone]

## 2017-01-26 NOTE — ED Notes (Signed)
Pt's family up to nurse first desk stating that they are not willing to wait - states they will see MD for direct admission tomorrow.

## 2017-01-29 NOTE — Telephone Encounter (Signed)
Rx phoned into pharmacy.Despina Hidden Cassady4/2/20184:51 PM

## 2017-01-30 ENCOUNTER — Telehealth (INDEPENDENT_AMBULATORY_CARE_PROVIDER_SITE_OTHER): Payer: Self-pay | Admitting: Orthopaedic Surgery

## 2017-01-30 ENCOUNTER — Other Ambulatory Visit: Payer: Self-pay

## 2017-01-30 ENCOUNTER — Telehealth: Payer: Self-pay | Admitting: Neurology

## 2017-01-30 DIAGNOSIS — S065X9A Traumatic subdural hemorrhage with loss of consciousness of unspecified duration, initial encounter: Secondary | ICD-10-CM

## 2017-01-30 DIAGNOSIS — S065XAA Traumatic subdural hemorrhage with loss of consciousness status unknown, initial encounter: Secondary | ICD-10-CM

## 2017-01-30 NOTE — Telephone Encounter (Signed)
I have a reminder note call them in 10 days, I will put in the order for the CT then.

## 2017-01-30 NOTE — Telephone Encounter (Signed)
Patient's daughter called stating that her mother is having severe back pain from a fall.  She has seen Dr. Lorin Mercy in the past for her back.  Daughter wants to know if her mother can be worked into Dr. Lorin Mercy schedule.  Daughter's [phone]

## 2017-01-30 NOTE — Telephone Encounter (Signed)
Patient daughter [name] (on dpr) called about her mom having a CT in the next 10 days.. I just need the order put in for me to be able to do the auth.

## 2017-01-31 NOTE — Telephone Encounter (Signed)
I worked patient into canceled appt at 2pm on Friday. I had to leave voicemail at number left for daughter. I did ask for her to call back and cancel if this appt did not work for them.

## 2017-01-31 NOTE — Telephone Encounter (Signed)
Patient's daughter called back again inquiring about an appointment for her mother.  CB#[phone].  Thank you.

## 2017-02-01 NOTE — Addendum Note (Signed)
Addended by: Kathrynn Ducking on: 02/01/2017 01:14 PM   Modules accepted: Orders

## 2017-02-01 NOTE — Telephone Encounter (Signed)
I will go ahead and put orders in for CT, I wanted this study to be done 14 days after the last study was done. I do not want to do the study too early.

## 2017-02-01 NOTE — Telephone Encounter (Signed)
Patient daughter called me and wanting to know what the status was on CT.. I informed her that Dr. Jannifer Franklin will call her in 10 days..with [name]'s insurance it can take up to 14 days to get the authorization done.. If you don't mind putting the order in for me to be able to the authorization.

## 2017-02-02 ENCOUNTER — Encounter (INDEPENDENT_AMBULATORY_CARE_PROVIDER_SITE_OTHER): Payer: Self-pay | Admitting: Orthopaedic Surgery

## 2017-02-02 ENCOUNTER — Ambulatory Visit (INDEPENDENT_AMBULATORY_CARE_PROVIDER_SITE_OTHER): Payer: PPO | Admitting: Orthopaedic Surgery

## 2017-02-02 VITALS — BP 115/68 | HR 71

## 2017-02-02 DIAGNOSIS — M545 Low back pain: Secondary | ICD-10-CM | POA: Diagnosis not present

## 2017-02-02 DIAGNOSIS — S065X9A Traumatic subdural hemorrhage with loss of consciousness of unspecified duration, initial encounter: Secondary | ICD-10-CM

## 2017-02-02 DIAGNOSIS — S065XAA Traumatic subdural hemorrhage with loss of consciousness status unknown, initial encounter: Secondary | ICD-10-CM

## 2017-02-02 DIAGNOSIS — I6200 Nontraumatic subdural hemorrhage, unspecified: Secondary | ICD-10-CM

## 2017-02-02 NOTE — Telephone Encounter (Signed)
I spoke to [name] (on dpr) & informed her that I submitted the clinical notes for the authorization so she would be aware that we are working towards getting the authorization  for her mother.

## 2017-02-02 NOTE — Progress Notes (Signed)
Office Visit Note   Patient: [name]           Date of Birth: May 17, 1941           MRN: [id number] Visit Date: 02/02/2017              Requested by: Bartholomew Crews, MD [address] [place], [place] [address] PCP: Larey Dresser, MD   Assessment & Plan: Visit Diagnoses:  1. Acute low back pain, unspecified back pain laterality, with sciatica presence unspecified   2. Subdural hematoma (HCC)     Plan: Patient previously lumbar decompression with recent fall and subdural which is been followed. She'll continue to work on walking. We discussed the making sure she uses a cane and avoid slippers sloping areas. We discussed the home modification for safety.  Follow-Up Instructions: Return if symptoms worsen or fail to improve.   Orders:  No orders of the defined types were placed in this encounter.  No orders of the defined types were placed in this encounter.     Procedures: No procedures performed   Clinical Data: No additional findings.   Subjective: Chief Complaint  Patient presents with  . Lower Back - Pain    HPI patient had previous L4-5 decompression for 517. She had a fall on 01/13/2017 and was seen in the emergency room where x-rays were negative. Patient states the pain is less now she's been a mature with a cane. She has seen a neurologist after hitting her head. An MRI or CAT scan was done which showed subdural collections right and left 78 mm. She states her memory is not been quite as good since she had her head. She's taken some tramadol or hydrocodone also use a stool softener laxative when she takes those medications.  Review of Systems  Constitutional: Negative for chills and diaphoresis.  HENT: Negative for ear discharge, ear pain and nosebleeds.   Eyes: Negative for discharge and visual disturbance.  Respiratory: Negative for cough, choking and shortness of breath.   Cardiovascular: Negative for chest pain and palpitations.    Gastrointestinal: Negative for abdominal distention and abdominal pain.  Endocrine: Negative for cold intolerance and heat intolerance.  Genitourinary: Negative for flank pain and hematuria.  Musculoskeletal:       Positive for gout previous L4-5 decompression.  Skin: Negative for rash and wound.  Neurological: Negative for seizures and speech difficulty.       Fall 01/13/2017 was subdural hematomas right and left.  Hematological: Negative for adenopathy. Does not bruise/bleed easily.  Psychiatric/Behavioral: Negative for agitation and suicidal ideas.   [place]az for a stage III kidney disease, hypertension obesity, gout, hyperlipidemia   Objective: Vital Signs: BP 115/68   Pulse 71   Physical Exam  Constitutional: She is oriented to person, place, and time. She appears well-developed.  HENT:  Head: Normocephalic.  Right Ear: External ear normal.  Left Ear: External ear normal.  Eyes: Pupils are equal, round, and reactive to light.  Neck: No tracheal deviation present. No thyromegaly present.  Cardiovascular: Normal rate.   Pulmonary/Chest: Effort normal.  Abdominal: Soft.  Musculoskeletal:  Well-healed lumbar incision. No sciatic notch tenderness anterior tib EHL is intact. She gets slowly from sitting to standing, she ambulates with a cane  Neurological: She is alert and oriented to person, place, and time.  Skin: Skin is warm and dry.  Psychiatric: She has a normal mood and affect. Her behavior is normal.    Ortho Exam  Specialty Comments:  No specialty comments available.  Imaging: Outside films are reviewed which shows previous decompression surgery without anterolisthesis. No evidence of compression fracture no changes that would suggest osteomyelitis.   PMFS History: Patient Active Problem List   Diagnosis Date Noted  . Acute low back pain 02/04/2017  . Subdural hematoma ([place]) 01/26/2017  . Memory disorder 12/20/2016  . History of lumbar laminectomy for spinal  cord decompression 02/02/2016  . Rash and nonspecific skin eruption 07/09/2015  . Bereavement 12/01/2014  . Psychosis 12/01/2014  . Reactive airway disease 08/11/2014  . History of cancer 08/11/2014  . OSA (obstructive sleep apnea) 08/11/2014  . Healthcare maintenance 07/17/2013  . Chronic kidney disease (CKD), stage III (moderate) 10/04/2012  . Schizophrenia, chronic condition ([place]) 01/20/2012  . Postmenopausal atrophic vaginitis 11/01/2010  . Chronic diastolic heart failure ([place]) 05/06/2010  . Allergic rhinitis 10/14/2009  . Obesity, Class II, BMI 35-39.9, with comorbidity 05/14/2009  . Chronic prescription opiate use 04/29/2008  . Anemia 09/25/2007  . Hyperlipidemia 09/03/2006  . Gout 09/03/2006  . Essential hypertension 09/03/2006  . GERD 09/03/2006  . Hx-TIA (transient ischemic attack) 05/13/2001   Past Medical History:  Diagnosis Date  . Chronic venous insufficiency   . Colon, diverticulosis Sept. 2011   outpatient colonoscopy by Dr. Cristina Gong.  Need record  . DDD (degenerative disc disease), lumbosacral   . Depression   . DJD (degenerative joint disease), multiple sites    Low back pain worst  . GERD (gastroesophageal reflux disease)   . Gout   . Gout    Uric Acid level 4.2 on 300 allopurinol  . History of cervical cancer 1972  . Hx-TIA (transient ischemic attack) 05/13/2001   Right facial numbness  . Hyperlipidemia   . Hypertension   . Memory disorder 12/20/2016  . Mitral valve prolapse 10/27/1999   Subsequent 2D echo show normal mitral valve  . Osteoarthritis of hip    bilateral hips  . Ovarian cancer ([place]) 1972   S/P oophorectomy  . Psychosis   . Recurrent boils    History of MRSA skin infections with abscess  . Sensorineural hearing loss of both ears   . Subdural hematoma ([place]) 01/26/2017   bilateral    Family History  Problem Relation Age of Onset  . Diabetes Mother   . Heart disease Mother   . Hearing loss Mother   . Stroke Mother   . Post-traumatic  stress disorder Father   . Schizophrenia Maternal Grandmother   . Diabetes Maternal Grandfather   . Other Brother     brother died in mental health hospital    Past Surgical History:  Procedure Laterality Date  . ABDOMINAL HYSTERECTOMY     1972  . CERVICAL DISCECTOMY  7/07   C5-C6  . JOINT REPLACEMENT     bilateral hip replacement  . LUMBAR DISC SURGERY     L5-S1 7/07  . LUMBAR LAMINECTOMY/DECOMPRESSION MICRODISCECTOMY N/A 02/02/2016   Procedure: L4-5 Decompression;  Surgeon: Marybelle Killings, MD;  Location: [place];  Service: Orthopedics;  Laterality: N/A;  . [place] for ovarian cancer   Social History   Occupational History  . Retired    Social History Main Topics  . Smoking status: Former Smoker    Packs/day: 0.10    Types: Cigarettes    Quit date: 04/08/2013  . Smokeless tobacco: Never Used  . Alcohol use No  . Drug use: No  . Sexual activity: No

## 2017-02-06 ENCOUNTER — Telehealth: Payer: Self-pay | Admitting: Neurology

## 2017-02-06 NOTE — Telephone Encounter (Signed)
Patient's daughter is calling concerned about her Mom having a bad headache last night and today.. She took Hydrocodone which did help. She also had severe diarrhea this past Sunday night. Her daughater does not think headache is severe enough to go to the ED but would like a call back to discuss.

## 2017-02-06 NOTE — Telephone Encounter (Signed)
I called the daughter. The patient had diarrhea on Sunday, 2 days ago, she had a headache yesterday and had to dose hydrocodone twice for this, but the headache is gone now. The patient otherwise is feeling well. We'll watch the patient for now, the CT of the head as been scheduled for 2 days from now.

## 2017-02-08 ENCOUNTER — Ambulatory Visit
Admission: RE | Admit: 2017-02-08 | Discharge: 2017-02-08 | Disposition: A | Discharge status: Home / Self Care | Payer: PPO | Source: Ambulatory Visit | Attending: Neurology | Admitting: Neurology

## 2017-02-08 DIAGNOSIS — S065X9A Traumatic subdural hemorrhage with loss of consciousness of unspecified duration, initial encounter: Secondary | ICD-10-CM

## 2017-02-08 DIAGNOSIS — S065XAA Traumatic subdural hemorrhage with loss of consciousness status unknown, initial encounter: Secondary | ICD-10-CM

## 2017-02-08 DIAGNOSIS — I6200 Nontraumatic subdural hemorrhage, unspecified: Secondary | ICD-10-CM | POA: Diagnosis not present

## 2017-02-09 ENCOUNTER — Encounter (HOSPITAL_COMMUNITY): Payer: Self-pay | Admitting: Emergency Medicine

## 2017-02-09 ENCOUNTER — Telehealth: Payer: Self-pay | Admitting: Neurology

## 2017-02-09 ENCOUNTER — Inpatient Hospital Stay (HOSPITAL_COMMUNITY)
Admission: EM | Admit: 2017-02-09 | Discharge: 2017-02-15 | DRG: 026 | Disposition: A | Discharge status: Home Health | Payer: PPO | Attending: Internal Medicine | Admitting: Internal Medicine

## 2017-02-09 ENCOUNTER — Emergency Department (HOSPITAL_COMMUNITY): Payer: PPO

## 2017-02-09 DIAGNOSIS — I5032 Chronic diastolic (congestive) heart failure: Secondary | ICD-10-CM | POA: Diagnosis not present

## 2017-02-09 DIAGNOSIS — I10 Essential (primary) hypertension: Secondary | ICD-10-CM

## 2017-02-09 DIAGNOSIS — K219 Gastro-esophageal reflux disease without esophagitis: Secondary | ICD-10-CM | POA: Diagnosis not present

## 2017-02-09 DIAGNOSIS — Z833 Family history of diabetes mellitus: Secondary | ICD-10-CM | POA: Diagnosis not present

## 2017-02-09 DIAGNOSIS — Z823 Family history of stroke: Secondary | ICD-10-CM | POA: Diagnosis not present

## 2017-02-09 DIAGNOSIS — F209 Schizophrenia, unspecified: Secondary | ICD-10-CM | POA: Diagnosis not present

## 2017-02-09 DIAGNOSIS — R51 Headache: Secondary | ICD-10-CM | POA: Diagnosis not present

## 2017-02-09 DIAGNOSIS — Z90710 Acquired absence of both cervix and uterus: Secondary | ICD-10-CM | POA: Diagnosis not present

## 2017-02-09 DIAGNOSIS — Z79891 Long term (current) use of opiate analgesic: Secondary | ICD-10-CM | POA: Diagnosis not present

## 2017-02-09 DIAGNOSIS — M109 Gout, unspecified: Secondary | ICD-10-CM | POA: Diagnosis present

## 2017-02-09 DIAGNOSIS — D62 Acute posthemorrhagic anemia: Secondary | ICD-10-CM | POA: Diagnosis not present

## 2017-02-09 DIAGNOSIS — W1830XA Fall on same level, unspecified, initial encounter: Secondary | ICD-10-CM | POA: Diagnosis not present

## 2017-02-09 DIAGNOSIS — Z8614 Personal history of Methicillin resistant Staphylococcus aureus infection: Secondary | ICD-10-CM

## 2017-02-09 DIAGNOSIS — Z87891 Personal history of nicotine dependence: Secondary | ICD-10-CM

## 2017-02-09 DIAGNOSIS — Z8249 Family history of ischemic heart disease and other diseases of the circulatory system: Secondary | ICD-10-CM

## 2017-02-09 DIAGNOSIS — G4733 Obstructive sleep apnea (adult) (pediatric): Secondary | ICD-10-CM | POA: Diagnosis present

## 2017-02-09 DIAGNOSIS — E785 Hyperlipidemia, unspecified: Secondary | ICD-10-CM | POA: Diagnosis not present

## 2017-02-09 DIAGNOSIS — F329 Major depressive disorder, single episode, unspecified: Secondary | ICD-10-CM | POA: Diagnosis present

## 2017-02-09 DIAGNOSIS — S065X0A Traumatic subdural hemorrhage without loss of consciousness, initial encounter: Secondary | ICD-10-CM | POA: Diagnosis not present

## 2017-02-09 DIAGNOSIS — K5909 Other constipation: Secondary | ICD-10-CM | POA: Diagnosis not present

## 2017-02-09 DIAGNOSIS — R197 Diarrhea, unspecified: Secondary | ICD-10-CM | POA: Diagnosis present

## 2017-02-09 DIAGNOSIS — N183 Chronic kidney disease, stage 3 unspecified: Secondary | ICD-10-CM | POA: Diagnosis present

## 2017-02-09 DIAGNOSIS — R140 Abdominal distension (gaseous): Secondary | ICD-10-CM | POA: Diagnosis not present

## 2017-02-09 DIAGNOSIS — S065X0D Traumatic subdural hemorrhage without loss of consciousness, subsequent encounter: Secondary | ICD-10-CM | POA: Diagnosis not present

## 2017-02-09 DIAGNOSIS — Z96643 Presence of artificial hip joint, bilateral: Secondary | ICD-10-CM | POA: Diagnosis not present

## 2017-02-09 DIAGNOSIS — Z79899 Other long term (current) drug therapy: Secondary | ICD-10-CM | POA: Diagnosis not present

## 2017-02-09 DIAGNOSIS — D649 Anemia, unspecified: Secondary | ICD-10-CM | POA: Diagnosis not present

## 2017-02-09 DIAGNOSIS — I6203 Nontraumatic chronic subdural hemorrhage: Secondary | ICD-10-CM | POA: Diagnosis not present

## 2017-02-09 DIAGNOSIS — I130 Hypertensive heart and chronic kidney disease with heart failure and stage 1 through stage 4 chronic kidney disease, or unspecified chronic kidney disease: Secondary | ICD-10-CM | POA: Diagnosis present

## 2017-02-09 DIAGNOSIS — Z8543 Personal history of malignant neoplasm of ovary: Secondary | ICD-10-CM

## 2017-02-09 DIAGNOSIS — Z818 Family history of other mental and behavioral disorders: Secondary | ICD-10-CM | POA: Diagnosis not present

## 2017-02-09 DIAGNOSIS — I6200 Nontraumatic subdural hemorrhage, unspecified: Secondary | ICD-10-CM | POA: Diagnosis not present

## 2017-02-09 DIAGNOSIS — S065X9A Traumatic subdural hemorrhage with loss of consciousness of unspecified duration, initial encounter: Secondary | ICD-10-CM

## 2017-02-09 DIAGNOSIS — Z8673 Personal history of transient ischemic attack (TIA), and cerebral infarction without residual deficits: Secondary | ICD-10-CM

## 2017-02-09 DIAGNOSIS — Z7951 Long term (current) use of inhaled steroids: Secondary | ICD-10-CM

## 2017-02-09 DIAGNOSIS — R109 Unspecified abdominal pain: Secondary | ICD-10-CM

## 2017-02-09 DIAGNOSIS — S065XAA Traumatic subdural hemorrhage with loss of consciousness status unknown, initial encounter: Secondary | ICD-10-CM | POA: Diagnosis present

## 2017-02-09 LAB — DIFFERENTIAL
Basophils Absolute: 0 10*3/uL (ref 0.0–0.1)
Basophils Relative: 0 %
Eosinophils Absolute: 0.3 10*3/uL (ref 0.0–0.7)
Eosinophils Relative: 5 %
Lymphocytes Relative: 31 %
Lymphs Abs: 2.1 10*3/uL (ref 0.7–4.0)
Monocytes Absolute: 0.4 10*3/uL (ref 0.1–1.0)
Monocytes Relative: 5 %
Neutro Abs: 4.1 10*3/uL (ref 1.7–7.7)
Neutrophils Relative %: 59 %

## 2017-02-09 LAB — I-STAT CHEM 8, ED
BUN: 21 mg/dL — ABNORMAL HIGH (ref 6–20)
Calcium, Ion: 1.24 mmol/L (ref 1.15–1.40)
Chloride: 107 mmol/L (ref 101–111)
Creatinine, Ser: 1.4 mg/dL — ABNORMAL HIGH (ref 0.44–1.00)
Glucose, Bld: 97 mg/dL (ref 65–99)
HCT: 35 % — ABNORMAL LOW (ref 36.0–46.0)
Hemoglobin: 11.9 g/dL — ABNORMAL LOW (ref 12.0–15.0)
Potassium: 4 mmol/L (ref 3.5–5.1)
Sodium: 140 mmol/L (ref 135–145)
TCO2: 25 mmol/L (ref 0–100)

## 2017-02-09 LAB — PROTIME-INR
INR: 1.08
Prothrombin Time: 14 seconds (ref 11.4–15.2)

## 2017-02-09 LAB — COMPREHENSIVE METABOLIC PANEL
ALT: 22 U/L (ref 14–54)
AST: 26 U/L (ref 15–41)
Albumin: 4.1 g/dL (ref 3.5–5.0)
Alkaline Phosphatase: 116 U/L (ref 38–126)
Anion gap: 14 (ref 5–15)
BUN: 19 mg/dL (ref 6–20)
CO2: 23 mmol/L (ref 22–32)
Calcium: 10.2 mg/dL (ref 8.9–10.3)
Chloride: 104 mmol/L (ref 101–111)
Creatinine, Ser: 1.33 mg/dL — ABNORMAL HIGH (ref 0.44–1.00)
GFR calc Af Amer: 44 mL/min — ABNORMAL LOW (ref >60)
GFR calc non Af Amer: 38 mL/min — ABNORMAL LOW (ref >60)
Glucose, Bld: 100 mg/dL — ABNORMAL HIGH (ref 65–99)
Potassium: 4 mmol/L (ref 3.5–5.1)
Sodium: 141 mmol/L (ref 135–145)
Total Bilirubin: 0.5 mg/dL (ref 0.3–1.2)
Total Protein: 7.1 g/dL (ref 6.5–8.1)

## 2017-02-09 LAB — CBC
HCT: 33.9 % — ABNORMAL LOW (ref 36.0–46.0)
Hemoglobin: 10.9 g/dL — ABNORMAL LOW (ref 12.0–15.0)
MCH: 30.7 pg (ref 26.0–34.0)
MCHC: 32.2 g/dL (ref 30.0–36.0)
MCV: 95.5 fL (ref 78.0–100.0)
Platelets: 226 10*3/uL (ref 150–400)
RBC: 3.55 MIL/uL — ABNORMAL LOW (ref 3.87–5.11)
RDW: 15.4 % (ref 11.5–15.5)
WBC: 7 10*3/uL (ref 4.0–10.5)

## 2017-02-09 LAB — I-STAT TROPONIN, ED: Troponin i, poc: 0 ng/mL (ref 0.00–0.08)

## 2017-02-09 LAB — CBG MONITORING, ED: Glucose-Capillary: 94 mg/dL (ref 65–99)

## 2017-02-09 LAB — APTT: aPTT: 28 seconds (ref 24–36)

## 2017-02-09 MED ORDER — OXYBUTYNIN CHLORIDE ER 5 MG PO TB24
5.0000 mg | ORAL_TABLET | Freq: Every day | ORAL | Status: DC
  Administered 2017-02-10 – 2017-02-14 (×6): 5 mg via ORAL
  Filled 2017-02-09 (×8): qty 1

## 2017-02-09 MED ORDER — POTASSIUM CHLORIDE CRYS ER 10 MEQ PO TBCR
10.0000 meq | EXTENDED_RELEASE_TABLET | Freq: Every day | ORAL | Status: DC
  Administered 2017-02-10 – 2017-02-15 (×5): 10 meq via ORAL
  Filled 2017-02-09 (×5): qty 1

## 2017-02-09 MED ORDER — AMLODIPINE BESYLATE 10 MG PO TABS
10.0000 mg | ORAL_TABLET | Freq: Every day | ORAL | Status: DC
  Administered 2017-02-10 – 2017-02-15 (×4): 10 mg via ORAL
  Filled 2017-02-09 (×5): qty 1

## 2017-02-09 MED ORDER — FLUTICASONE PROPIONATE 50 MCG/ACT NA SUSP: 1.0000 | Freq: Every day | NASAL | Status: DC | PRN

## 2017-02-09 MED ORDER — LOSARTAN POTASSIUM 50 MG PO TABS
100.0000 mg | ORAL_TABLET | Freq: Every day | ORAL | Status: DC
  Administered 2017-02-10 – 2017-02-15 (×4): 100 mg via ORAL
  Filled 2017-02-09 (×5): qty 2

## 2017-02-09 MED ORDER — TRAMADOL HCL 50 MG PO TABS
50.0000 mg | ORAL_TABLET | Freq: Two times a day (BID) | ORAL | Status: DC | PRN
  Administered 2017-02-10 – 2017-02-15 (×5): 50 mg via ORAL
  Filled 2017-02-09 (×7): qty 1

## 2017-02-09 MED ORDER — FUROSEMIDE 20 MG PO TABS
20.0000 mg | ORAL_TABLET | Freq: Every day | ORAL | Status: DC
  Administered 2017-02-10 – 2017-02-13 (×3): 20 mg via ORAL
  Filled 2017-02-09 (×3): qty 1

## 2017-02-09 MED ORDER — ATORVASTATIN CALCIUM 40 MG PO TABS
40.0000 mg | ORAL_TABLET | Freq: Every day | ORAL | Status: DC
  Administered 2017-02-10 – 2017-02-15 (×5): 40 mg via ORAL
  Filled 2017-02-09 (×5): qty 1

## 2017-02-09 MED ORDER — HYDROXYZINE HCL 25 MG PO TABS
25.0000 mg | ORAL_TABLET | Freq: Every day | ORAL | Status: DC
  Administered 2017-02-10 – 2017-02-14 (×6): 25 mg via ORAL
  Filled 2017-02-09 (×6): qty 1

## 2017-02-09 MED ORDER — BUPROPION HCL ER (XL) 150 MG PO TB24
300.0000 mg | ORAL_TABLET | Freq: Every morning | ORAL | Status: DC
  Administered 2017-02-10 – 2017-02-15 (×5): 300 mg via ORAL
  Filled 2017-02-09: qty 1
  Filled 2017-02-09: qty 2
  Filled 2017-02-09: qty 1
  Filled 2017-02-09 (×2): qty 2

## 2017-02-09 MED ORDER — HYDROCODONE-ACETAMINOPHEN 5-325 MG PO TABS
1.0000 | ORAL_TABLET | Freq: Four times a day (QID) | ORAL | Status: DC | PRN
  Administered 2017-02-10 – 2017-02-13 (×10): 1 via ORAL
  Filled 2017-02-09 (×10): qty 1

## 2017-02-09 MED ORDER — PANTOPRAZOLE SODIUM 40 MG PO TBEC
40.0000 mg | DELAYED_RELEASE_TABLET | Freq: Two times a day (BID) | ORAL | Status: DC
  Administered 2017-02-10 – 2017-02-15 (×9): 40 mg via ORAL
  Filled 2017-02-09 (×9): qty 1

## 2017-02-09 MED ORDER — ALLOPURINOL 100 MG PO TABS
300.0000 mg | ORAL_TABLET | Freq: Every day | ORAL | Status: DC
  Administered 2017-02-10 – 2017-02-15 (×5): 300 mg via ORAL
  Filled 2017-02-09: qty 3
  Filled 2017-02-09: qty 1
  Filled 2017-02-09 (×2): qty 3
  Filled 2017-02-09: qty 1

## 2017-02-09 NOTE — Telephone Encounter (Signed)
Please call daughter and advise her to take patient to ER, if there is evidence of any of the following: slurring of speech, HA, lethargy, drowsiness, sleepiness, balance problem, nausea or vomiting, blurry vision, one sided weakness or numbness.

## 2017-02-09 NOTE — ED Notes (Signed)
The pt reports that  She has back problems and sometimes has difficulty walking for awhile.  She can weigh bear and can get up to use  The bed side commode with little difficulty

## 2017-02-09 NOTE — ED Provider Notes (Signed)
[facility] DEPT Provider Note   CSN: [id number] Arrival date & time: 02/09/17  1414     History   Chief Complaint Chief Complaint  Patient presents with  . Weakness    HPI [name] is a 75 y.o. female.  HPI   Patient 75 year old female presenting with acute on chronic subdural.  Mid-March patient presented after fall. Had a normal CAT scan at that time. Patient then was scheduled for an outpatient MRI for other purposes. On the outpatient MRI that she is found to have to subdural hemorrhages. Patient had follow-up with Dr. Jannifer Franklin, her neurologist. He repeated the CAT scan yesterday to make sure the subdurals were improving. They were in fact getting worse, so he told her to come here to the emergency department.  CT was repeated again in the emergency department today showing no difference between today's and yesterday's. Patient is not on aspirin Plavix or Coumadin.  Past Medical History:  Diagnosis Date  . Chronic venous insufficiency   . Colon, diverticulosis Sept. 2011   outpatient colonoscopy by Dr. Cristina Gong.  Need record  . DDD (degenerative disc disease), lumbosacral   . Depression   . DJD (degenerative joint disease), multiple sites    Low back pain worst  . GERD (gastroesophageal reflux disease)   . Gout   . Gout    Uric Acid level 4.2 on 300 allopurinol  . History of cervical cancer 1972  . Hx-TIA (transient ischemic attack) 05/13/2001   Right facial numbness  . Hyperlipidemia   . Hypertension   . Memory disorder 12/20/2016  . Mitral valve prolapse 10/27/1999   Subsequent 2D echo show normal mitral valve  . Osteoarthritis of hip    bilateral hips  . Ovarian cancer ([place]) 1972   S/P oophorectomy  . Psychosis   . Recurrent boils    History of MRSA skin infections with abscess  . Sensorineural hearing loss of both ears   . Subdural hematoma ([place]) 01/26/2017   bilateral    Patient Active Problem List   Diagnosis Date Noted  . Acute low back  pain 02/04/2017  . Subdural hematoma ([place]) 01/26/2017  . Memory disorder 12/20/2016  . History of lumbar laminectomy for spinal cord decompression 02/02/2016  . Rash and nonspecific skin eruption 07/09/2015  . Bereavement 12/01/2014  . Psychosis 12/01/2014  . Reactive airway disease 08/11/2014  . History of cancer 08/11/2014  . OSA (obstructive sleep apnea) 08/11/2014  . Healthcare maintenance 07/17/2013  . Chronic kidney disease (CKD), stage III (moderate) 10/04/2012  . Schizophrenia, chronic condition ([place]) 01/20/2012  . Postmenopausal atrophic vaginitis 11/01/2010  . Chronic diastolic heart failure ([place]) 05/06/2010  . Allergic rhinitis 10/14/2009  . Obesity, Class II, BMI 35-39.9, with comorbidity 05/14/2009  . Chronic prescription opiate use 04/29/2008  . Anemia 09/25/2007  . Hyperlipidemia 09/03/2006  . Gout 09/03/2006  . Essential hypertension 09/03/2006  . GERD 09/03/2006  . Hx-TIA (transient ischemic attack) 05/13/2001    Past Surgical History:  Procedure Laterality Date  . ABDOMINAL HYSTERECTOMY     1972  . CERVICAL DISCECTOMY  7/07   C5-C6  . JOINT REPLACEMENT     bilateral hip replacement  . LUMBAR DISC SURGERY     L5-S1 7/07  . LUMBAR LAMINECTOMY/DECOMPRESSION MICRODISCECTOMY N/A 02/02/2016   Procedure: L4-5 Decompression;  Surgeon: Marybelle Killings, MD;  Location: [facility];  Service: Orthopedics;  Laterality: N/A;  . [place] for ovarian cancer    OB History  No data available       Home Medications      Family History Family History  Problem Relation Age of Onset  . Diabetes Mother   . Heart disease Mother   . Hearing loss Mother   . Stroke Mother   . Post-traumatic stress disorder Father   . Schizophrenia Maternal Grandmother   . Diabetes Maternal Grandfather   . Other Brother     brother died in mental health [facility]    Social History Social History  Substance Use Topics  . Smoking status: Former Smoker    Packs/day: 0.10     Types: Cigarettes    Quit date: 04/08/2013  . Smokeless tobacco: Never Used  . Alcohol use No     Allergies   Aspirin and Sulfamethoxazole-trimethoprim   Review of Systems Review of Systems  Constitutional: Negative for activity change and fatigue.  HENT: Negative for congestion and drooling.   Cardiovascular: Negative for chest pain.  Genitourinary: Negative for dysuria.  Musculoskeletal: Negative for joint swelling.  Skin: Negative for rash.  Allergic/Immunologic: Negative for immunocompromised state.  Neurological: Negative for seizures, speech difficulty and weakness.  Psychiatric/Behavioral: Negative for agitation and behavioral problems.  All other systems reviewed and are negative.    Physical Exam Updated Vital Signs BP 133/60   Pulse 67   Temp 98.5 F (36.9 C)   Resp (!) 23   Ht 5\' 4"  (1.626 m)   Wt 194 lb (88 kg)   SpO2 100%   BMI 33.30 kg/m   Physical Exam  Constitutional: She is oriented to person, place, and time. She appears well-developed and well-nourished.  HENT:  Head: Normocephalic and atraumatic.  Eyes: EOM are normal. Pupils are equal, round, and reactive to light. Right eye exhibits no discharge. Left eye exhibits no discharge.  Cardiovascular: Normal rate, regular rhythm and normal heart sounds.   No murmur heard. Pulmonary/Chest: Effort normal and breath sounds normal. She has no wheezes. She has no rales.  Abdominal: Soft. She exhibits no distension. There is no tenderness.  Neurological: She is oriented to person, place, and time.  Skin: Skin is warm and dry. She is not diaphoretic.  Psychiatric: She has a normal mood and affect.  Nursing note and vitals reviewed.    ED Treatments / Results  Labs (all labs ordered are listed, but only abnormal results are displayed) Labs Reviewed  CBC - Abnormal; Notable for the following:       Result Value   RBC 3.55 (*)    Hemoglobin 10.9 (*)    HCT 33.9 (*)    All other components within  normal limits  COMPREHENSIVE METABOLIC PANEL - Abnormal; Notable for the following:    Glucose, Bld 100 (*)    Creatinine, Ser 1.33 (*)    GFR calc non Af Amer 38 (*)    GFR calc Af Amer 44 (*)    All other components within normal limits  I-STAT CHEM 8, ED - Abnormal; Notable for the following:    BUN 21 (*)    Creatinine, Ser 1.40 (*)    Hemoglobin 11.9 (*)    HCT 35.0 (*)    All other components within normal limits  PROTIME-INR  APTT  DIFFERENTIAL  I-STAT TROPOININ, ED  CBG MONITORING, ED    EKG  EKG Interpretation None       Radiology Ct Head Wo Contrast  Result Date: 02/09/2017 CLINICAL DATA:  Recent intracranial hemorrhage, increased symptoms headache, leg heaviness, generalized weakness,  and confusion, history ovarian cancer, cervical cancer, TIA, subdural hematoma, smoker EXAM: CT HEAD WITHOUT CONTRAST TECHNIQUE: Contiguous axial images were obtained from the base of the skull through the vertex without intravenous contrast. COMPARISON:  02/08/2017 FINDINGS: Brain: BILATERAL subdural collections are identified, consistent with subdural hematomas. RIGHT subdural measures 11 mm thick, unchanged and is slightly higher in attenuation than the LEFT subdural hematoma which measures 11 mm thick, previously 10 mm. Mass effect on both hemispheres of the brain with flattening of gyri. No midline shift. Ventricular morphology unchanged. No intraparenchymal hemorrhage, mass lesion, or evidence acute infarction. Vascular: Atherosclerotic calcifications at the carotid siphons. Skull: Intact Sinuses/Orbits: Clear Other: N/A IMPRESSION: BILATERAL subdural hematomas measure 11 mm diameter, with RIGHT previously 11 mm and LEFT previously 10 mm on 02/08/2017. No new intracranial abnormalities. Electronically Signed   By: Lavonia Dana M.D.   On: 02/09/2017 17:37   Ct Head Wo Contrast  Result Date: 02/09/2017 CLINICAL DATA:  Subdural hematoma. EXAM: CT HEAD WITHOUT CONTRAST TECHNIQUE: Contiguous  axial images were obtained from the base of the skull through the vertex without intravenous contrast. COMPARISON:  MRI brain 01/25/2017 and CT of the head 01/13/2017. FINDINGS: Brain: Bilateral subdural collections have increased in size since the prior exam. The right-sided collection is homogeneously hyperdense. There is mixed density in the left-sided collection with layering blood products posteriorly. The right-sided collection extends along the anterior interhemispheric fissure to a greater extent than the left-sided collection does. There is no significant midline shift. Sulcal effacement aunt partial ventricular effacement is increased from the 03/17 study. No acute infarct is present. The brainstem and cerebellum are within normal limits. Vascular: Atherosclerotic calcifications are present within the cavernous internal carotid arteries bilaterally. The calvarium is intact. Skull: Calvarium is intact. There is no evidence for acute fracture. No significant extracranial soft tissue injury is present. No focal lytic or blastic lesions are present. Sinuses/Orbits: The globes and orbits are within normal limits. The paranasal sinuses and mastoid air cells are clear. IMPRESSION: 1. Slight increase in size of bilateral subdural hematoma is, right greater than left. 2. Homogeneous increased density of the right sided collection suggesting a more acute hemorrhage or ongoing hemorrhage. 3. Layering blood products on the left, more typical of expected evolution of the previous subdural hematoma. 4. Bilateral mass effect without significant midline shift. These results were called by telephone at the time of interpretation on 02/09/2017 at 8:45 am to Dr. Leta Baptist , who verbally acknowledged these results. Electronically Signed   By: San Morelle M.D.   On: 02/09/2017 08:53    Procedures Procedures (including critical care time)  Medications Ordered in ED Medications - No data to display   Initial  Impression / Assessment and Plan / ED Course  I have reviewed the triage vital signs and the nursing notes.  Pertinent labs & imaging results that were available during my care of the patient were reviewed by me and considered in my medical decision making (see chart for details).     Patient is 76 year old female presenting with acute on chronic subdural. Patient had an MRI several weeks ago showing chronic subdural. CAT scan yesterday showed increasing subdural. Discussed with Neurology. We will admit for observation to internal medicine. Patient currently not on aspirin or any anticoagulants. (taken off asa after initail subdural found)   Patient;s primary neurologist is Dr. Jannifer Franklin who sent her here for eval. Neurology here, after discussion in the ED, did not think emergent neurosurgery consult was warranted given  that she has no new neurologic symptoms at this time.. Will defer to inpatient team.     Final Clinical Impressions(s) / ED Diagnoses   Final diagnoses:  None    New Prescriptions New Prescriptions   No medications on file     Khy Pitre Julio Alm, MD 02/11/17 1104

## 2017-02-09 NOTE — Telephone Encounter (Signed)
Called and left detailed VM for daughter. Advised I spoke with Dr Rexene Alberts who feels she should bring her mother to the ER to be evaluated further. Asked her to call back to ensure she received message. Advised we close at 12pm. Gave [facility] phone number.

## 2017-02-09 NOTE — ED Notes (Signed)
Pt laughing talking with family member.   Waiting on room assignment

## 2017-02-09 NOTE — Telephone Encounter (Signed)
I called the patient, talk with the daughter. The subdural hematoma appears to be getting larger rather than smaller, the patient is now on emergency room, I agree with this decision, the patient will need to be evaluated and followed through a neurosurgeon.   CT brain 02/09/17:  IMPRESSION: 1. Slight increase in size of bilateral subdural hematoma is, right greater than left. 2. Homogeneous increased density of the right sided collection suggesting a more acute hemorrhage or ongoing hemorrhage. 3. Layering blood products on the left, more typical of expected evolution of the previous subdural hematoma. 4. Bilateral mass effect without significant midline shift.

## 2017-02-09 NOTE — ED Triage Notes (Signed)
Pt presents to ED for assessment after being diagnosed with "blood on the brain" approx 1 month ago.  Pt had CT scan yesterday after having headache along with leg heaviness, generalized weakness, confusion, and pt's daughter states CT showed "the area had grown".  Patient c/o heaviness stronger on right leg, but strength equal.

## 2017-02-09 NOTE — Telephone Encounter (Signed)
Dr. Jobe Igo called regarding CT head from today; this shows increase in size of subdural hematomas, right greater than left, with possible acute component on the right side.   Will call patient and family and determine next steps of care (ER evaluation vs monitoring).    Penni Bombard, MD 03/16/3357, 2:51 AM Certified in Neurology, Neurophysiology and Neuroimaging  [facility] [address] [place], [place] [address] [phone]

## 2017-02-09 NOTE — Telephone Encounter (Signed)
Called and spoke with [name] (daughter). Relayed results of CT scan per Dr Leta Baptist note. She verbalized understanding.   She stated patient has no slurring of speech. She did complain of headache this past Sunday that was severe. That is when she called and spoke with Dr Jannifer Franklin on 02/06/17 and he instructed her to monitor the patient and go for CT scan.  HA did improve after taking hydrocodone. She had a slight HA yesterday. Not as severe. She stated pt is not walking much. She gets up during the day about 12x/day. Patient daughter has noticed her walking is different. Pt thinks her legs are stiffening up d/t not getting up as much. But daughter noticed more stiffness, weakness. She was getting in the car yesterday and usually can lift her legs into the car, but was unable to do this. She needed assistance. Right leg is weaker than left leg. Patient is alert and aware of surroundings. She gets confused a little per daughter. She was trying to do something with her bank card last night and got confused of what she was trying to do. She woke up last night not being able to sleep, and was cleaning up. She c/o blurry vision. Daughter unsure if related to her cataracts or d/t fall. She feels like blurry vision has worsened since fall.   Diarrhea started around the first of the month. She also c/o numbness in hands. This sx was present prior to fall. This is not a new sx.   Advised daughter to closely monitor the patient. If she noticed any stroke like symptoms, severe HA, N/V, increased blurry vision, numbness/weakness to bring her straight to ER to be evaluated. Going to speak with [facility] to see if they want her to continue to closely monitor her or bring her to ER today. I will call back to advise. She verbalized understanding.

## 2017-02-09 NOTE — ED Notes (Signed)
Report called to rn on 5 m 

## 2017-02-09 NOTE — Telephone Encounter (Signed)
Tried calling home number, rang several times and went to automated message to enter remote access code. Unable to LVM.  Called mobile for daughter and LVM for her to call back to discuss mother. Gave [facility] phone number for return call

## 2017-02-09 NOTE — Telephone Encounter (Signed)
Called and left detailed message again stating she should bring mother to ER per Dr Rexene Alberts. Asked her to call back if she can before 12pm to let me know she received message.

## 2017-02-09 NOTE — Telephone Encounter (Signed)
Opal Sidles with [facility] is calling for Dr Rowe Clack. He wanting to speak a physician only immediately of an urgent matter reg Dr Jannifer Franklin pt De Nurse [phone]. The work in was not available, Dr Leta Baptist was notified and talked with radiologist.

## 2017-02-09 NOTE — ED Notes (Signed)
Admitting doctor at the bedside 

## 2017-02-09 NOTE — ED Notes (Signed)
Called to give report  Asked to call back in 10 minutes

## 2017-02-09 NOTE — ED Notes (Signed)
The pt is visiting with her family member no distress

## 2017-02-09 NOTE — ED Notes (Signed)
Pt still saying that her back hurts and that she can never lift her legs  Because her back hurts her

## 2017-02-09 NOTE — ED Notes (Signed)
Pt talking on the phone no distress

## 2017-02-09 NOTE — Telephone Encounter (Signed)
Patient daughter called office back. She apologized. She received my message. She was on a work call. I recommended she bring mother to ER. Daughter wanted to know if she could wait until 5pm to bring her. I advised that is her decision in the end, but I recommended to bring her asap. She states her mother was alone right now. I recommended someone be with her right now at all times because of the sx she is having. Want to ensure she is safe and able to observe any changes from her baseline. She will bring her [place]. I did advise that Dr Rexene Alberts on call after hours if they need anything. She verbalized understanding and appreciation.

## 2017-02-09 NOTE — ED Notes (Signed)
MD Mackuen at the bedside 

## 2017-02-09 NOTE — ED Notes (Addendum)
Doctor still at the bedside  Unable to do neuro checks at this time

## 2017-02-09 NOTE — Consult Note (Signed)
Reason for Consult: Subdural hematoma Referring Physician: Emergency department  [name] is an 76 y.o. female.  HPI: Patient is a 76 year old female who suffered a mechanical fall approximately 6 weeks ago. Patient without significant loss of consciousness that time. Initial head CT scan negative. Patient with some mild intermittent headaches and some increasing neurocognitive problems. Outside MRI scan was done as an outpatient which demonstrated bilateral chronic subdural hematomas. The patient was doing well clinically at that time and observational management was recommended. The patient underwent a follow-up head CT scan today which demonstrates some degree of enlargement of these chronic subdural collections more prominently on the left. The patient reports some increasing headaches. She's having some vague visual disturbance. She is also noting some generalized weakness. She's having difficulty standing and ambulating. Patient also notes some intermittent GI distress and recent difficulty with diarrhea. Patient with long-standing lumbar pain and known history of moderate lumbar stenosis. The patient does not have any radiating pain into her legs. She has no radiating numbness. She is having no new bowel or bladder dysfunction.  Past Medical History:  Diagnosis Date  . Chronic venous insufficiency   . Colon, diverticulosis Sept. 2011   outpatient colonoscopy by Dr. Cristina Gong.  Need record  . DDD (degenerative disc disease), lumbosacral   . Depression   . DJD (degenerative joint disease), multiple sites    Low back pain worst  . GERD (gastroesophageal reflux disease)   . Gout   . Gout    Uric Acid level 4.2 on 300 allopurinol  . History of cervical cancer 1972  . Hx-TIA (transient ischemic attack) 05/13/2001   Right facial numbness  . Hyperlipidemia   . Hypertension   . Memory disorder 12/20/2016  . Mitral valve prolapse 10/27/1999   Subsequent 2D echo show normal mitral valve  .  Osteoarthritis of hip    bilateral hips  . Ovarian cancer ([place]) 1972   S/P oophorectomy  . Psychosis   . Recurrent boils    History of MRSA skin infections with abscess  . Sensorineural hearing loss of both ears   . Subdural hematoma ([place]) 01/26/2017   bilateral    Past Surgical History:  Procedure Laterality Date  . ABDOMINAL HYSTERECTOMY     1972  . CERVICAL DISCECTOMY  7/07   C5-C6  . JOINT REPLACEMENT     bilateral hip replacement  . LUMBAR DISC SURGERY     L5-S1 7/07  . LUMBAR LAMINECTOMY/DECOMPRESSION MICRODISCECTOMY N/A 02/02/2016   Procedure: L4-5 Decompression;  Surgeon: Marybelle Killings, MD;  Location: [facility];  Service: Orthopedics;  Laterality: N/A;  . [place] for ovarian cancer    Family History  Problem Relation Age of Onset  . Diabetes Mother   . Heart disease Mother   . Hearing loss Mother   . Stroke Mother   . Post-traumatic stress disorder Father   . Schizophrenia Maternal Grandmother   . Diabetes Maternal Grandfather   . Other Brother     brother died in mental health hospital    Social History:  reports that she quit smoking about 3 years ago. Her smoking use included Cigarettes. She smoked 0.10 packs per day. She has never used smokeless tobacco. She reports that she does not drink alcohol or use drugs.  Allergies:  Allergies  Allergen Reactions  . Aspirin Other (See Comments)    Bleeding from vagina   . Sulfamethoxazole-Trimethoprim Itching    Medications: I have reviewed the patient's  current medications.  Results for orders placed or performed during the hospital encounter of 02/09/17 (from the past 48 hour(s))  Protime-INR     Status: None   Collection Time: 02/09/17  3:45 PM  Result Value Ref Range   Prothrombin Time 14.0 11.4 - 15.2 seconds   INR 1.08   APTT     Status: None   Collection Time: 02/09/17  3:45 PM  Result Value Ref Range   aPTT 28 24 - 36 seconds  CBC     Status: Abnormal   Collection Time: 02/09/17  3:45  PM  Result Value Ref Range   WBC 7.0 4.0 - 10.5 K/uL   RBC 3.55 (L) 3.87 - 5.11 MIL/uL   Hemoglobin 10.9 (L) 12.0 - 15.0 g/dL   HCT 33.9 (L) 36.0 - 46.0 %   MCV 95.5 78.0 - 100.0 fL   MCH 30.7 26.0 - 34.0 pg   MCHC 32.2 30.0 - 36.0 g/dL   RDW 15.4 11.5 - 15.5 %   Platelets 226 150 - 400 K/uL  Differential     Status: None   Collection Time: 02/09/17  3:45 PM  Result Value Ref Range   Neutrophils Relative % 59 %   Neutro Abs 4.1 1.7 - 7.7 K/uL   Lymphocytes Relative 31 %   Lymphs Abs 2.1 0.7 - 4.0 K/uL   Monocytes Relative 5 %   Monocytes Absolute 0.4 0.1 - 1.0 K/uL   Eosinophils Relative 5 %   Eosinophils Absolute 0.3 0.0 - 0.7 K/uL   Basophils Relative 0 %   Basophils Absolute 0.0 0.0 - 0.1 K/uL  Comprehensive metabolic panel     Status: Abnormal   Collection Time: 02/09/17  3:45 PM  Result Value Ref Range   Sodium 141 135 - 145 mmol/L   Potassium 4.0 3.5 - 5.1 mmol/L   Chloride 104 101 - 111 mmol/L   CO2 23 22 - 32 mmol/L   Glucose, Bld 100 (H) 65 - 99 mg/dL   BUN 19 6 - 20 mg/dL   Creatinine, Ser 1.33 (H) 0.44 - 1.00 mg/dL   Calcium 10.2 8.9 - 10.3 mg/dL   Total Protein 7.1 6.5 - 8.1 g/dL   Albumin 4.1 3.5 - 5.0 g/dL   AST 26 15 - 41 U/L   ALT 22 14 - 54 U/L   Alkaline Phosphatase 116 38 - 126 U/L   Total Bilirubin 0.5 0.3 - 1.2 mg/dL   GFR calc non Af Amer 38 (L) >60 mL/min   GFR calc Af Amer 44 (L) >60 mL/min    Comment: (NOTE) The eGFR has been calculated using the CKD EPI equation. This calculation has not been validated in all clinical situations. eGFR's persistently <60 mL/min signify possible Chronic Kidney Disease.    Anion gap 14 5 - 15  I-stat troponin, ED     Status: None   Collection Time: 02/09/17  3:57 PM  Result Value Ref Range   Troponin i, poc 0.00 0.00 - 0.08 ng/mL   Comment 3            Comment: Due to the release kinetics of cTnI, a negative result within the first hours of the onset of symptoms does not rule out myocardial infarction  with certainty. If myocardial infarction is still suspected, repeat the test at appropriate intervals.   I-Stat Chem 8, ED     Status: Abnormal   Collection Time: 02/09/17  3:59 PM  Result Value Ref Range  Sodium 140 135 - 145 mmol/L   Potassium 4.0 3.5 - 5.1 mmol/L   Chloride 107 101 - 111 mmol/L   BUN 21 (H) 6 - 20 mg/dL   Creatinine, Ser 1.40 (H) 0.44 - 1.00 mg/dL   Glucose, Bld 97 65 - 99 mg/dL   Calcium, Ion 1.24 1.15 - 1.40 mmol/L   TCO2 25 0 - 100 mmol/L   Hemoglobin 11.9 (L) 12.0 - 15.0 g/dL   HCT 35.0 (L) 36.0 - 46.0 %  CBG monitoring, ED     Status: None   Collection Time: 02/09/17  5:32 PM  Result Value Ref Range   Glucose-Capillary 94 65 - 99 mg/dL    Ct Head Wo Contrast  Result Date: 02/09/2017 CLINICAL DATA:  Recent intracranial hemorrhage, increased symptoms headache, leg heaviness, generalized weakness, and confusion, history ovarian cancer, cervical cancer, TIA, subdural hematoma, smoker EXAM: CT HEAD WITHOUT CONTRAST TECHNIQUE: Contiguous axial images were obtained from the base of the skull through the vertex without intravenous contrast. COMPARISON:  02/08/2017 FINDINGS: Brain: BILATERAL subdural collections are identified, consistent with subdural hematomas. RIGHT subdural measures 11 mm thick, unchanged and is slightly higher in attenuation than the LEFT subdural hematoma which measures 11 mm thick, previously 10 mm. Mass effect on both hemispheres of the brain with flattening of gyri. No midline shift. Ventricular morphology unchanged. No intraparenchymal hemorrhage, mass lesion, or evidence acute infarction. Vascular: Atherosclerotic calcifications at the carotid siphons. Skull: Intact Sinuses/Orbits: Clear Other: N/A IMPRESSION: BILATERAL subdural hematomas measure 11 mm diameter, with RIGHT previously 11 mm and LEFT previously 10 mm on 02/08/2017. No new intracranial abnormalities. Electronically Signed   By: Lavonia Dana M.D.   On: 02/09/2017 17:37   Ct Head Wo  Contrast  Result Date: 02/09/2017 CLINICAL DATA:  Subdural hematoma. EXAM: CT HEAD WITHOUT CONTRAST TECHNIQUE: Contiguous axial images were obtained from the base of the skull through the vertex without intravenous contrast. COMPARISON:  MRI brain 01/25/2017 and CT of the head 01/13/2017. FINDINGS: Brain: Bilateral subdural collections have increased in size since the prior exam. The right-sided collection is homogeneously hyperdense. There is mixed density in the left-sided collection with layering blood products posteriorly. The right-sided collection extends along the anterior interhemispheric fissure to a greater extent than the left-sided collection does. There is no significant midline shift. Sulcal effacement aunt partial ventricular effacement is increased from the 03/17 study. No acute infarct is present. The brainstem and cerebellum are within normal limits. Vascular: Atherosclerotic calcifications are present within the cavernous internal carotid arteries bilaterally. The calvarium is intact. Skull: Calvarium is intact. There is no evidence for acute fracture. No significant extracranial soft tissue injury is present. No focal lytic or blastic lesions are present. Sinuses/Orbits: The globes and orbits are within normal limits. The paranasal sinuses and mastoid air cells are clear. IMPRESSION: 1. Slight increase in size of bilateral subdural hematoma is, right greater than left. 2. Homogeneous increased density of the right sided collection suggesting a more acute hemorrhage or ongoing hemorrhage. 3. Layering blood products on the left, more typical of expected evolution of the previous subdural hematoma. 4. Bilateral mass effect without significant midline shift. These results were called by telephone at the time of interpretation on 02/09/2017 at 8:45 am to Dr. Leta Baptist , who verbally acknowledged these results. Electronically Signed   By: San Morelle M.D.   On: 02/09/2017 08:53     Pertinent items noted in HPI and remainder of comprehensive ROS otherwise negative. Blood pressure 108/86, pulse  68, temperature 98.5 F (36.9 C), resp. rate (!) 21, height 5' 4"  (1.626 m), weight 88 kg (194 lb), SpO2 98 %. The patient is awake and alert. She is oriented and reasonably appropriate. Her speech is fluent. Her judgment and insight appear intact. Cranial nerve function is normal bilaterally. Motor examination her upper and lower extremities reveal 5 over 5 strength bilaterally. There is no evidence of pronator drift. Reflexes hypoactive but symmetric. No evidence of long track signs. The patient was not ambulated. Examination her head ears eyes nose and throat is unremarkable. Chest and abdomen are benign.  Assessment/Plan: Slowly enlarging bilateral chronic subdural hematomas left greater than right. I recommend medical admission to optimize patient for planned burr hole evacuation of subdural hematomas on Monday afternoon. I will continue to follow patient while hospitalized.  Hagar Sadiq A 02/09/2017, 9:55 PM

## 2017-02-09 NOTE — Telephone Encounter (Signed)
I called patient, talk with the daughter. The CT the head shows some mild increase in the subdural hematoma, they are in the emergency room, I agree with this decision. The patient will need to see a neurosurgeon for close follow-up and decision concerning if and when the subdural should be surgically drained.   CT head 02/09/17:  IMPRESSION: 1. Slight increase in size of bilateral subdural hematoma is, right greater than left. 2. Homogeneous increased density of the right sided collection suggesting a more acute hemorrhage or ongoing hemorrhage. 3. Layering blood products on the left, more typical of expected evolution of the previous subdural hematoma. 4. Bilateral mass effect without significant midline shift.

## 2017-02-10 ENCOUNTER — Inpatient Hospital Stay (HOSPITAL_COMMUNITY): Payer: PPO

## 2017-02-10 DIAGNOSIS — Z8673 Personal history of transient ischemic attack (TIA), and cerebral infarction without residual deficits: Secondary | ICD-10-CM | POA: Diagnosis not present

## 2017-02-10 DIAGNOSIS — D62 Acute posthemorrhagic anemia: Secondary | ICD-10-CM | POA: Diagnosis not present

## 2017-02-10 DIAGNOSIS — R269 Unspecified abnormalities of gait and mobility: Secondary | ICD-10-CM | POA: Diagnosis not present

## 2017-02-10 DIAGNOSIS — S065XAA Traumatic subdural hemorrhage with loss of consciousness status unknown, initial encounter: Secondary | ICD-10-CM | POA: Diagnosis present

## 2017-02-10 DIAGNOSIS — I130 Hypertensive heart and chronic kidney disease with heart failure and stage 1 through stage 4 chronic kidney disease, or unspecified chronic kidney disease: Secondary | ICD-10-CM | POA: Diagnosis not present

## 2017-02-10 DIAGNOSIS — W1830XA Fall on same level, unspecified, initial encounter: Secondary | ICD-10-CM | POA: Diagnosis not present

## 2017-02-10 DIAGNOSIS — S065X9A Traumatic subdural hemorrhage with loss of consciousness of unspecified duration, initial encounter: Secondary | ICD-10-CM | POA: Diagnosis not present

## 2017-02-10 DIAGNOSIS — Z96643 Presence of artificial hip joint, bilateral: Secondary | ICD-10-CM | POA: Diagnosis not present

## 2017-02-10 DIAGNOSIS — Z833 Family history of diabetes mellitus: Secondary | ICD-10-CM | POA: Diagnosis not present

## 2017-02-10 DIAGNOSIS — N183 Chronic kidney disease, stage 3 (moderate): Secondary | ICD-10-CM | POA: Diagnosis not present

## 2017-02-10 DIAGNOSIS — Z90710 Acquired absence of both cervix and uterus: Secondary | ICD-10-CM | POA: Diagnosis not present

## 2017-02-10 DIAGNOSIS — I6203 Nontraumatic chronic subdural hemorrhage: Secondary | ICD-10-CM | POA: Diagnosis not present

## 2017-02-10 DIAGNOSIS — S065X0D Traumatic subdural hemorrhage without loss of consciousness, subsequent encounter: Secondary | ICD-10-CM | POA: Diagnosis not present

## 2017-02-10 DIAGNOSIS — M1990 Unspecified osteoarthritis, unspecified site: Secondary | ICD-10-CM | POA: Diagnosis not present

## 2017-02-10 DIAGNOSIS — Z8249 Family history of ischemic heart disease and other diseases of the circulatory system: Secondary | ICD-10-CM | POA: Diagnosis not present

## 2017-02-10 DIAGNOSIS — M109 Gout, unspecified: Secondary | ICD-10-CM | POA: Diagnosis not present

## 2017-02-10 DIAGNOSIS — I5032 Chronic diastolic (congestive) heart failure: Secondary | ICD-10-CM | POA: Diagnosis not present

## 2017-02-10 DIAGNOSIS — F209 Schizophrenia, unspecified: Secondary | ICD-10-CM | POA: Diagnosis not present

## 2017-02-10 DIAGNOSIS — R140 Abdominal distension (gaseous): Secondary | ICD-10-CM | POA: Diagnosis not present

## 2017-02-10 DIAGNOSIS — S065X0A Traumatic subdural hemorrhage without loss of consciousness, initial encounter: Secondary | ICD-10-CM | POA: Diagnosis not present

## 2017-02-10 DIAGNOSIS — K219 Gastro-esophageal reflux disease without esophagitis: Secondary | ICD-10-CM | POA: Diagnosis not present

## 2017-02-10 DIAGNOSIS — Z8614 Personal history of Methicillin resistant Staphylococcus aureus infection: Secondary | ICD-10-CM | POA: Diagnosis not present

## 2017-02-10 DIAGNOSIS — I6200 Nontraumatic subdural hemorrhage, unspecified: Secondary | ICD-10-CM | POA: Diagnosis not present

## 2017-02-10 DIAGNOSIS — I6202 Nontraumatic subacute subdural hemorrhage: Secondary | ICD-10-CM | POA: Diagnosis not present

## 2017-02-10 DIAGNOSIS — R51 Headache: Secondary | ICD-10-CM | POA: Diagnosis not present

## 2017-02-10 DIAGNOSIS — Z8543 Personal history of malignant neoplasm of ovary: Secondary | ICD-10-CM | POA: Diagnosis not present

## 2017-02-10 DIAGNOSIS — Z823 Family history of stroke: Secondary | ICD-10-CM | POA: Diagnosis not present

## 2017-02-10 DIAGNOSIS — Z87891 Personal history of nicotine dependence: Secondary | ICD-10-CM | POA: Diagnosis not present

## 2017-02-10 DIAGNOSIS — Z79891 Long term (current) use of opiate analgesic: Secondary | ICD-10-CM | POA: Diagnosis not present

## 2017-02-10 DIAGNOSIS — D649 Anemia, unspecified: Secondary | ICD-10-CM | POA: Diagnosis not present

## 2017-02-10 DIAGNOSIS — Z79899 Other long term (current) drug therapy: Secondary | ICD-10-CM | POA: Diagnosis not present

## 2017-02-10 DIAGNOSIS — G4733 Obstructive sleep apnea (adult) (pediatric): Secondary | ICD-10-CM | POA: Diagnosis not present

## 2017-02-10 DIAGNOSIS — M25559 Pain in unspecified hip: Secondary | ICD-10-CM | POA: Diagnosis not present

## 2017-02-10 DIAGNOSIS — I10 Essential (primary) hypertension: Secondary | ICD-10-CM | POA: Diagnosis not present

## 2017-02-10 DIAGNOSIS — E785 Hyperlipidemia, unspecified: Secondary | ICD-10-CM | POA: Diagnosis not present

## 2017-02-10 DIAGNOSIS — R197 Diarrhea, unspecified: Secondary | ICD-10-CM | POA: Diagnosis present

## 2017-02-10 DIAGNOSIS — Z818 Family history of other mental and behavioral disorders: Secondary | ICD-10-CM | POA: Diagnosis not present

## 2017-02-10 DIAGNOSIS — K5909 Other constipation: Secondary | ICD-10-CM | POA: Diagnosis not present

## 2017-02-10 LAB — MRSA PCR SCREENING: MRSA by PCR: NEGATIVE

## 2017-02-10 MED ORDER — BISACODYL 10 MG RE SUPP
10.0000 mg | Freq: Once | RECTAL | Status: AC
Start: 2017-02-10 — End: 2017-02-10
  Administered 2017-02-10: 10 mg via RECTAL
  Filled 2017-02-10: qty 1

## 2017-02-10 NOTE — Progress Notes (Signed)
Patient ID: [name], female   DOB: 11-22-1940, 76 y.o.   MRN: [id number] Subjective: Patient reports moderately severe headache no NTW  Objective: Vital signs in last 24 hours: Temp:  [98.5 F (36.9 C)-99.1 F (37.3 C)] 98.9 F (37.2 C) (04/14 0400) Pulse Rate:  [67-78] 75 (04/14 0400) Resp:  [17-27] 18 (04/14 0400) BP: (108-137)/(60-94) 121/74 (04/14 0400) SpO2:  [95 %-100 %] 98 % (04/14 0400) Weight:  [86.5 kg (190 lb 12.8 oz)-88 kg (194 lb)] 86.5 kg (190 lb 12.8 oz) (04/13 2353)  Intake/Output from previous day: 04/13 0701 - 04/14 0700 In: 477 [P.O.:477] Out: 500 [Urine:500] Intake/Output this shift: No intake/output data recorded.  Neurologic: Grossly normal  Lab Results: Lab Results  Component Value Date   WBC 7.0 02/09/2017   HGB 11.9 (L) 02/09/2017   HCT 35.0 (L) 02/09/2017   MCV 95.5 02/09/2017   PLT 226 02/09/2017   Lab Results  Component Value Date   INR 1.08 02/09/2017   BMET Lab Results  Component Value Date   NA 140 02/09/2017   K 4.0 02/09/2017   CL 107 02/09/2017   CO2 23 02/09/2017   GLUCOSE 97 02/09/2017   BUN 21 (H) 02/09/2017   CREATININE 1.40 (H) 02/09/2017   CALCIUM 10.2 02/09/2017    Studies/Results: Ct Head Wo Contrast  Result Date: 02/09/2017 CLINICAL DATA:  Recent intracranial hemorrhage, increased symptoms headache, leg heaviness, generalized weakness, and confusion, history ovarian cancer, cervical cancer, TIA, subdural hematoma, smoker EXAM: CT HEAD WITHOUT CONTRAST TECHNIQUE: Contiguous axial images were obtained from the base of the skull through the vertex without intravenous contrast. COMPARISON:  02/08/2017 FINDINGS: Brain: BILATERAL subdural collections are identified, consistent with subdural hematomas. RIGHT subdural measures 11 mm thick, unchanged and is slightly higher in attenuation than the LEFT subdural hematoma which measures 11 mm thick, previously 10 mm. Mass effect on both hemispheres of the brain with  flattening of gyri. No midline shift. Ventricular morphology unchanged. No intraparenchymal hemorrhage, mass lesion, or evidence acute infarction. Vascular: Atherosclerotic calcifications at the carotid siphons. Skull: Intact Sinuses/Orbits: Clear Other: N/A IMPRESSION: BILATERAL subdural hematomas measure 11 mm diameter, with RIGHT previously 11 mm and LEFT previously 10 mm on 02/08/2017. No new intracranial abnormalities. Electronically Signed   By: Lavonia Dana M.D.   On: 02/09/2017 17:37   Ct Head Wo Contrast  Result Date: 02/09/2017 CLINICAL DATA:  Subdural hematoma. EXAM: CT HEAD WITHOUT CONTRAST TECHNIQUE: Contiguous axial images were obtained from the base of the skull through the vertex without intravenous contrast. COMPARISON:  MRI brain 01/25/2017 and CT of the head 01/13/2017. FINDINGS: Brain: Bilateral subdural collections have increased in size since the prior exam. The right-sided collection is homogeneously hyperdense. There is mixed density in the left-sided collection with layering blood products posteriorly. The right-sided collection extends along the anterior interhemispheric fissure to a greater extent than the left-sided collection does. There is no significant midline shift. Sulcal effacement aunt partial ventricular effacement is increased from the 03/17 study. No acute infarct is present. The brainstem and cerebellum are within normal limits. Vascular: Atherosclerotic calcifications are present within the cavernous internal carotid arteries bilaterally. The calvarium is intact. Skull: Calvarium is intact. There is no evidence for acute fracture. No significant extracranial soft tissue injury is present. No focal lytic or blastic lesions are present. Sinuses/Orbits: The globes and orbits are within normal limits. The paranasal sinuses and mastoid air cells are clear. IMPRESSION: 1. Slight increase in size of bilateral subdural hematoma is,  right greater than left. 2. Homogeneous increased  density of the right sided collection suggesting a more acute hemorrhage or ongoing hemorrhage. 3. Layering blood products on the left, more typical of expected evolution of the previous subdural hematoma. 4. Bilateral mass effect without significant midline shift. These results were called by telephone at the time of interpretation on 02/09/2017 at 8:45 am to Dr. Leta Baptist , who verbally acknowledged these results. Electronically Signed   By: San Morelle M.D.   On: 02/09/2017 08:53    Assessment/Plan: B CSDH, plan surgery monday   LOS: 0 days    JONES,DAVID S 02/10/2017, 8:06 AM

## 2017-02-10 NOTE — Progress Notes (Signed)
PROGRESS NOTE    [name]  BDZ:[id number] DOB: Mar 16, 1941 DOA: 02/09/2017 PCP: Larey Dresser, MD   Brief Narrative: 76 y.o. female with a past medical history significant for schizophrenia well-compensated, OSA not on CPAP, HTN, hx of TIA and gout, recently had fall with ER visit on CT scan of head was unremarkable. Patient then had MRI of the brain ordered by neurologist for another reason when she was found to have incidental bilateral small subdural hematoma. She was initially asymptomatic so it was decided to follow-up outpatient. But next 2- 3 days patient was complaining of new lower extremities weakness, intermittent headache and therefore her neurologist ordered a follow-up CT scan the day prior to admission when she was found to have enlargement of bilateral subdural hematomas with no midline shift. At that time she was sent to the ER for further evaluation including neurosurgery consultation.  Assessment & Plan:  # Bilateral subdural hematoma associated with headache and lower extremities weakness: -Evaluated by neurosurgeon, plan for neurosurgical intervention on Monday. I discussed with Dr. Annette Stable today. -Labs unremarkable, coags normal. -Further management as per neurosurgeon -Pain management.  #Hypertension: Continue home medication. Monitor blood pressure. Patient is on Norvasc, Lasix, losartan  #History of schizophrenia: Continue home medication  #Chronic kidney disease is stage III: Serum creatinine level stable, around baseline. Monitor BMP. Avoid nephrotoxins.  PT, OT evaluation and treatment after the procedure.  Principal Problem:   Subdural hematoma without coma (HCC) Active Problems:   Essential hypertension   Chronic diastolic heart failure (HCC)   Chronic prescription opiate use   Schizophrenia, chronic condition (HCC)   Chronic kidney disease (CKD), stage III (moderate)  DVT prophylaxis: SCD. No anticoagulation because of subdural hematoma Code  Status: Full code Family Communication: Discussed with the patient's daughter at bedside Disposition Plan: Likely discharge home versus rehabilitation in 2-3 days depending on neurosurgeon's plan    Consultants:   Neurosurgery  Procedures: None Antimicrobials: None  Subjective: Patient was seen and examined at bedside. She reported headache is better with the current medications. Denied chest pain, shortness of breath, nausea or vomiting.  Objective: Vitals:   02/09/17 2244 02/09/17 2353 02/10/17 0400 02/10/17 0958  BP: 116/76 128/73 121/74 113/64  Pulse: 72 72 75 74  Resp: 18 17 18 20   Temp:  99.1 F (37.3 C) 98.9 F (37.2 C) 99.1 F (37.3 C)  TempSrc:  Oral Oral Oral  SpO2: 99% 100% 98% 98%  Weight:  86.5 kg (190 lb 12.8 oz)    Height:  5\' 4"  (1.626 m)      Intake/Output Summary (Last 24 hours) at 02/10/17 1202 Last data filed at 02/10/17 0845  Gross per 24 hour  Intake              717 ml  Output              500 ml  Net              217 ml   Filed Weights   02/09/17 1526 02/09/17 2353  Weight: 88 kg (194 lb) 86.5 kg (190 lb 12.8 oz)    Examination:  General exam: Appears calm and comfortable  Respiratory system: Clear to auscultation. Respiratory effort normal. No wheezing or crackle Cardiovascular system: S1 & S2 heard, RRR.  No pedal edema. Gastrointestinal system: Abdomen is nondistended, soft and nontender. Normal bowel sounds heard. Central nervous system: Alert and oriented. No focal neurological deficits. Extremities: Symmetric 5 x 5 power. Skin: No rashes, lesions  or ulcers Psychiatry: Judgement and insight appear normal. Mood & affect appropriate.     Data Reviewed: I have personally reviewed following labs and imaging studies  CBC:  Recent Labs Lab 02/09/17 1545 02/09/17 1559  WBC 7.0  --   NEUTROABS 4.1  --   HGB 10.9* 11.9*  HCT 33.9* 35.0*  MCV 95.5  --   PLT 226  --    Basic Metabolic Panel:  Recent Labs Lab 02/09/17 1545  02/09/17 1559  NA 141 140  K 4.0 4.0  CL 104 107  CO2 23  --   GLUCOSE 100* 97  BUN 19 21*  CREATININE 1.33* 1.40*  CALCIUM 10.2  --    GFR: Estimated Creatinine Clearance: 36.9 mL/min (A) (by C-G formula based on SCr of 1.4 mg/dL (H)). Liver Function Tests:  Recent Labs Lab 02/09/17 1545  AST 26  ALT 22  ALKPHOS 116  BILITOT 0.5  PROT 7.1  ALBUMIN 4.1   No results for input(s): LIPASE, AMYLASE in the last 168 hours. No results for input(s): AMMONIA in the last 168 hours. Coagulation Profile:  Recent Labs Lab 02/09/17 1545  INR 1.08   Cardiac Enzymes: No results for input(s): CKTOTAL, CKMB, CKMBINDEX, TROPONINI in the last 168 hours. BNP (last 3 results) No results for input(s): PROBNP in the last 8760 hours. HbA1C: No results for input(s): HGBA1C in the last 72 hours. CBG:  Recent Labs Lab 02/09/17 1732  GLUCAP 94   Lipid Profile: No results for input(s): CHOL, HDL, LDLCALC, TRIG, CHOLHDL, LDLDIRECT in the last 72 hours. Thyroid Function Tests: No results for input(s): TSH, T4TOTAL, FREET4, T3FREE, THYROIDAB in the last 72 hours. Anemia Panel: No results for input(s): VITAMINB12, FOLATE, FERRITIN, TIBC, IRON, RETICCTPCT in the last 72 hours. Sepsis Labs: No results for input(s): PROCALCITON, LATICACIDVEN in the last 168 hours.  Recent Results (from the past 240 hour(s))  MRSA PCR Screening     Status: None   Collection Time: 02/10/17  1:38 AM  Result Value Ref Range Status   MRSA by PCR NEGATIVE NEGATIVE Final    Comment:        The GeneXpert MRSA Assay (FDA approved for NASAL specimens only), is one component of a comprehensive MRSA colonization surveillance program. It is not intended to diagnose MRSA infection nor to guide or monitor treatment for MRSA infections.          Radiology Studies: Ct Head Wo Contrast  Result Date: 02/09/2017 CLINICAL DATA:  Recent intracranial hemorrhage, increased symptoms headache, leg heaviness,  generalized weakness, and confusion, history ovarian cancer, cervical cancer, TIA, subdural hematoma, smoker EXAM: CT HEAD WITHOUT CONTRAST TECHNIQUE: Contiguous axial images were obtained from the base of the skull through the vertex without intravenous contrast. COMPARISON:  02/08/2017 FINDINGS: Brain: BILATERAL subdural collections are identified, consistent with subdural hematomas. RIGHT subdural measures 11 mm thick, unchanged and is slightly higher in attenuation than the LEFT subdural hematoma which measures 11 mm thick, previously 10 mm. Mass effect on both hemispheres of the brain with flattening of gyri. No midline shift. Ventricular morphology unchanged. No intraparenchymal hemorrhage, mass lesion, or evidence acute infarction. Vascular: Atherosclerotic calcifications at the carotid siphons. Skull: Intact Sinuses/Orbits: Clear Other: N/A IMPRESSION: BILATERAL subdural hematomas measure 11 mm diameter, with RIGHT previously 11 mm and LEFT previously 10 mm on 02/08/2017. No new intracranial abnormalities. Electronically Signed   By: Lavonia Dana M.D.   On: 02/09/2017 17:37   Ct Head Wo Contrast  Result Date: 02/09/2017  CLINICAL DATA:  Subdural hematoma. EXAM: CT HEAD WITHOUT CONTRAST TECHNIQUE: Contiguous axial images were obtained from the base of the skull through the vertex without intravenous contrast. COMPARISON:  MRI brain 01/25/2017 and CT of the head 01/13/2017. FINDINGS: Brain: Bilateral subdural collections have increased in size since the prior exam. The right-sided collection is homogeneously hyperdense. There is mixed density in the left-sided collection with layering blood products posteriorly. The right-sided collection extends along the anterior interhemispheric fissure to a greater extent than the left-sided collection does. There is no significant midline shift. Sulcal effacement aunt partial ventricular effacement is increased from the 03/17 study. No acute infarct is present. The  brainstem and cerebellum are within normal limits. Vascular: Atherosclerotic calcifications are present within the cavernous internal carotid arteries bilaterally. The calvarium is intact. Skull: Calvarium is intact. There is no evidence for acute fracture. No significant extracranial soft tissue injury is present. No focal lytic or blastic lesions are present. Sinuses/Orbits: The globes and orbits are within normal limits. The paranasal sinuses and mastoid air cells are clear. IMPRESSION: 1. Slight increase in size of bilateral subdural hematoma is, right greater than left. 2. Homogeneous increased density of the right sided collection suggesting a more acute hemorrhage or ongoing hemorrhage. 3. Layering blood products on the left, more typical of expected evolution of the previous subdural hematoma. 4. Bilateral mass effect without significant midline shift. These results were called by telephone at the time of interpretation on 02/09/2017 at 8:45 am to Dr. Leta Baptist , who verbally acknowledged these results. Electronically Signed   By: San Morelle M.D.   On: 02/09/2017 08:53        Scheduled Meds: . allopurinol  300 mg Oral Daily  . amLODipine  10 mg Oral Daily  . atorvastatin  40 mg Oral Daily  . buPROPion  300 mg Oral q morning - 10a  . furosemide  20 mg Oral Daily  . hydrOXYzine  25 mg Oral QHS  . losartan  100 mg Oral Daily  . oxybutynin  5 mg Oral QHS  . pantoprazole  40 mg Oral BID AC  . potassium chloride  10 mEq Oral Daily   Continuous Infusions:   LOS: 0 days    Dron Tanna Furry, MD [facility] Pager [phone]  If 7PM-7AM, please contact night-coverage [email or web address] Password [facility] 02/10/2017, 12:02 PM

## 2017-02-10 NOTE — H&P (Signed)
History and Physical  Patient Name: [name]     EXB:[id number]    DOB: 1941/03/20    DOA: 02/09/2017 PCP: Larey Dresser, MD   Patient coming from: Home  Chief Complaint: Subdural hematomas, progressive leg weakness, headache  HPI: [name] is a 76 y.o. female with a past medical history significant for schizophrenia well-compensated, OSA not on CPAP, HTN, hx of TIA and gout who presents with worsening subdurals.  The patient was in her usual state of health until mid March. She walked to her neighbor's house to feed her dog that she has wont to do, but fell in a stiff breeze because she didn't have her walker and fell, striking her head.  She was seen in the ER that night, had a head CT that was unremarkable, and was discharged. Then just by chance, she had an MRI brain ordered by her neurologist for another reason that was performed about a week later, and showed incidental bilateral small subdural hematomas. She was asymptomatic at that time, and so it was elected to monitor this as an outpatient. Within the last 2-3 days, however she has had new leg weakness, bilaterally, worse on the left, "stiffness", intermittent headaches again, and so her neurologist ordered a follow-up CT yesterday which showed enlargement of bilateral subdural hematomas, some effacement but no midline shift, and so she was sent to the ER for neurosurgery consultation.  ED course: -Afebrile, heart rate 73, respirations 18, blood pressure 122/72, pulse oximetry 95% on room air -Na 141, K 4.0, Cr 1.33 (baseline 1.4), WBC 7.6K, Hgb 10.9 (baseline 11) -INR 1.08 normal Troponin negative -CT of the head was obtained that showed essentially no change from yesterday -TRH was asked to admit for subdural hematoma -I did discuss the case with Neurosurgery who have already evaluated the patient and recommend non-urgent burr-hole evacuation and pre-operative clearance by Medicine    Of note, the patient has  chronic constipation for which she takes a laxative every other day. Recently she was constipated, so her neurologist recommended she double the dose, after which she has had several loose stools per day, including an episode of incontinence on Sunday. She reports 5 soft bowel movements yesterday, and probably about the same number today. Remarkably she is still taking MiraLAX every other day, as well as senna. Her last antibiotics were over a year ago, she and her daughter think, in the context of back surgery.  She has had no nausea or vomiting, no abdominal pain and no sick contacts.  She has had no new chest pain, palpitations, dyspena on exertion, leg swelling orthopnea, or PND.  She has no history of CHF or heart attack.  She carries a chart diagnosis of OSA but does not use CPAP.  She is on no blood thinners (was on aspirin before her fall for TIA, but this has been held)     ROS: ROS        Past Medical History:  Diagnosis Date  . Chronic venous insufficiency   . Colon, diverticulosis Sept. 2011   outpatient colonoscopy by Dr. Cristina Gong.  Need record  . DDD (degenerative disc disease), lumbosacral   . Depression   . DJD (degenerative joint disease), multiple sites    Low back pain worst  . GERD (gastroesophageal reflux disease)   . Gout   . Gout    Uric Acid level 4.2 on 300 allopurinol  . History of cervical cancer 1972  . Hx-TIA (transient ischemic attack) 05/13/2001  Right facial numbness  . Hyperlipidemia   . Hypertension   . Memory disorder 12/20/2016  . Mitral valve prolapse 10/27/1999   Subsequent 2D echo show normal mitral valve  . Osteoarthritis of hip    bilateral hips  . Ovarian cancer ([place]) 1972   S/P oophorectomy  . Psychosis   . Recurrent boils    History of MRSA skin infections with abscess  . Sensorineural hearing loss of both ears   . Subdural hematoma ([place]) 01/26/2017   bilateral    Past Surgical History:  Procedure Laterality Date  . ABDOMINAL  HYSTERECTOMY     1972  . CERVICAL DISCECTOMY  7/07   C5-C6  . JOINT REPLACEMENT     bilateral hip replacement  . LUMBAR DISC SURGERY     L5-S1 7/07  . LUMBAR LAMINECTOMY/DECOMPRESSION MICRODISCECTOMY N/A 02/02/2016   Procedure: L4-5 Decompression;  Surgeon: Marybelle Killings, MD;  Location: [facility];  Service: Orthopedics;  Laterality: N/A;  . [place] for ovarian cancer    Social History: Patient lives with herslef.  The patient walks with a walker.  She is a former smoker.  She was a CNA.    Allergies  Allergen Reactions  . Aspirin Other (See Comments)    Bleeding from vagina   . Sulfamethoxazole-Trimethoprim Itching    Family history: family history includes Diabetes in her maternal grandfather and mother; Hearing loss in her mother; Heart disease in her mother; Other in her brother; Post-traumatic stress disorder in her father; Schizophrenia in her maternal grandmother; Stroke in her mother.  Prior to Admission medications   Medication Sig Start Date End Date Taking? Authorizing Provider  allopurinol (ZYLOPRIM) 300 MG tablet TAKE 1 TABLET BY MOUTH DAILY 05/25/16  Yes Bartholomew Crews, MD  amLODipine (NORVASC) 10 MG tablet TAKE 1 TABLET BY MOUTH EVERY DAY 05/25/16  Yes Bartholomew Crews, MD  ascorbic acid (VITAMIN C) 500 MG tablet Take 500 mg by mouth daily.   Yes Historical Provider, MD  atorvastatin (LIPITOR) 40 MG tablet TAKE 1 TABLET BY MOUTH EVERY DAY 06/27/16  Yes Bartholomew Crews, MD  buPROPion (WELLBUTRIN XL) 300 MG 24 hr tablet Take 1 tablet (300 mg total) by mouth every morning. 11/28/16  Yes Kathlee Nations, MD  Calcium Carbonate-Vitamin D (CALCIUM-VITAMIN D) 500-200 MG-UNIT per tablet Take 1 tablet by mouth daily.     Yes Historical Provider, MD  fluocinonide cream (LIDEX) 0.05 % APPLY TO AFFECTED AREA TWICE DAILY 11/02/16  Yes Bartholomew Crews, MD  fluticasone [facility]) 50 MCG/ACT nasal spray INSTILL 2 SPRAYS IN EACH NOSTRIL DAILY 01/01/17  Yes Bartholomew Crews, MD  furosemide (LASIX) 20 MG tablet TAKE ONE TABLET BY MOUTH DAILY. MAY TAKE AN EXTRA ONE AS NEEDED ONCE DAILY IF SWELLING OR EDEMA IS BAD 01/29/17  Yes Bartholomew Crews, MD  HYDROcodone-acetaminophen (NORCO/VICODIN) 5-325 MG tablet Take 1 tablet by mouth every 6 (six) hours as needed for moderate pain. 01/26/17  Yes Kathrynn Ducking, MD  hydrOXYzine (VISTARIL) 25 MG capsule Take 1 capsule (25 mg total) by mouth at bedtime. 11/28/16  Yes Kathlee Nations, MD  losartan (COZAAR) 100 MG tablet TAKE ONE TABLET BY MOUTH DAILY 01/29/17  Yes Bartholomew Crews, MD  Multiple Vitamins-Minerals (ALIVE WOMENS ENERGY) TABS Take by mouth.   Yes Historical Provider, MD  nystatin (MYCOSTATIN/NYSTOP) powder APPLY TO AFFECTED AREA TWICE DAILY 01/29/17  Yes Bartholomew Crews, MD  OVER THE [place]  capsule: Take 1 capsule by mouth once a day   Yes Historical Provider, MD  pantoprazole (PROTONIX) 40 MG tablet TAKE 1 TABLET BY MOUTH TWICE DAILY BEFORE A MEAL 05/25/16  Yes Bartholomew Crews, MD  potassium chloride (MICRO-K) 10 MEQ CR capsule TAKE 1 CAPSULE BY MOUTH DAILY 05/25/16  Yes Bartholomew Crews, MD  traMADol (ULTRAM) 50 MG tablet TAKE 1 TABLET BY MOUTH TWICE DAILY AS NEEDED Patient taking differently: Take 50 mg by mouth two times a day as needed for pain 01/29/17  Yes Bartholomew Crews, MD  docusate sodium (COLACE) 100 MG capsule Take 1 capsule (100 mg total) by mouth every 12 (twelve) hours. 12/21/13   Elnora Morrison, MD  oxybutynin (DITROPAN-XL) 5 MG 24 hr tablet TAKE ONE TABLET BY MOUTH AT BEDTIME Patient not taking: Reported on 02/09/2017 05/25/16   Bartholomew Crews, MD  senna (SENOKOT) 8.6 MG TABS tablet Take 1 tablet by mouth every other day.    Historical Provider, MD       Physical Exam: BP 128/73 (BP Location: Right Arm)   Pulse 72   Temp 99.1 F (37.3 C) (Oral)   Resp 17   Ht 5\' 4"  (1.626 m)   Wt 86.5 kg (190 lb 12.8 oz)   SpO2 100%   BMI 32.75 kg/m  General  appearance: Well-developed, adult female, alert and in acute distress.   Eyes: Anicteric, conjunctiva pink, lids and lashes normal. PERRL.    ENT: No nasal deformity, discharge, epistaxis.  Hearing somewhat diminished. OP moist without lesions.   Neck: No neck masses.  Trachea midline.  No thyromegaly/tenderness. Lymph: No cervical or supraclavicular lymphadenopathy. Skin: Warm and dry.  No jaundice.  No suspicious rashes or lesions. Cardiac: RRR, nl S1-S2, no murmurs appreciated.  Capillary refill is brisk.  JVP normale.  No LE edema.  Radial and DP pulses 2+ and symmetric. Respiratory: Normal respiratory rate and rhythm.  CTAB without rales or wheezes. Abdomen: Abdomen soft.  No TTP. No ascites, distension, hepatosplenomegaly.   MSK: No deformities or effusions.  No cyanosis or clubbing. Neuro: Cranial nerves 3-12 intact.  Sensation intact to light touch. Speech is fluent.  Muscle strength 5/5 in upper extremities, perhaps 5-/5 in bilateral lower extremities, I feel 4/5 on left.  FTN normal.  Romberg normal.  Babinski normal.    Psych: Sensorium intact and responding to questions, attention normal.  Behavior appropriate.  Affect normal.  Judgment and insight appear normal.     Labs on Admission:  I have personally reviewed following labs and imaging studies: CBC:  Recent Labs Lab 02/09/17 1545 02/09/17 1559  WBC 7.0  --   NEUTROABS 4.1  --   HGB 10.9* 11.9*  HCT 33.9* 35.0*  MCV 95.5  --   PLT 226  --    Basic Metabolic Panel:  Recent Labs Lab 02/09/17 1545 02/09/17 1559  NA 141 140  K 4.0 4.0  CL 104 107  CO2 23  --   GLUCOSE 100* 97  BUN 19 21*  CREATININE 1.33* 1.40*  CALCIUM 10.2  --    GFR: Estimated Creatinine Clearance: 36.9 mL/min (A) (by C-G formula based on SCr of 1.4 mg/dL (H)).  Liver Function Tests:  Recent Labs Lab 02/09/17 1545  AST 26  ALT 22  ALKPHOS 116  BILITOT 0.5  PROT 7.1  ALBUMIN 4.1   No results for input(s): LIPASE, AMYLASE in the  last 168 hours. No results for input(s): AMMONIA in the last 168  hours. Coagulation Profile:  Recent Labs Lab 02/09/17 1545  INR 1.08   Cardiac Enzymes: No results for input(s): CKTOTAL, CKMB, CKMBINDEX, TROPONINI in the last 168 hours. BNP (last 3 results) No results for input(s): PROBNP in the last 8760 hours. HbA1C: No results for input(s): HGBA1C in the last 72 hours. CBG:  Recent Labs Lab 02/09/17 1732  GLUCAP 94   Lipid Profile: No results for input(s): CHOL, HDL, LDLCALC, TRIG, CHOLHDL, LDLDIRECT in the last 72 hours. Thyroid Function Tests: No results for input(s): TSH, T4TOTAL, FREET4, T3FREE, THYROIDAB in the last 72 hours. Anemia Panel: No results for input(s): VITAMINB12, FOLATE, FERRITIN, TIBC, IRON, RETICCTPCT in the last 72 hours. Sepsis Labs: Invalid input(s): PROCALCITONIN, LACTICIDVEN No results found for this or any previous visit (from the past 240 hour(s)).       Radiological Exams on Admission: Personally reviewed: Ct Head Wo Contrast  Result Date: 02/09/2017 CLINICAL DATA:  Recent intracranial hemorrhage, increased symptoms headache, leg heaviness, generalized weakness, and confusion, history ovarian cancer, cervical cancer, TIA, subdural hematoma, smoker EXAM: CT HEAD WITHOUT CONTRAST TECHNIQUE: Contiguous axial images were obtained from the base of the skull through the vertex without intravenous contrast. COMPARISON:  02/08/2017 FINDINGS: Brain: BILATERAL subdural collections are identified, consistent with subdural hematomas. RIGHT subdural measures 11 mm thick, unchanged and is slightly higher in attenuation than the LEFT subdural hematoma which measures 11 mm thick, previously 10 mm. Mass effect on both hemispheres of the brain with flattening of gyri. No midline shift. Ventricular morphology unchanged. No intraparenchymal hemorrhage, mass lesion, or evidence acute infarction. Vascular: Atherosclerotic calcifications at the carotid siphons. Skull:  Intact Sinuses/Orbits: Clear Other: N/A IMPRESSION: BILATERAL subdural hematomas measure 11 mm diameter, with RIGHT previously 11 mm and LEFT previously 10 mm on 02/08/2017. No new intracranial abnormalities. Electronically Signed   By: Lavonia Dana M.D.   On: 02/09/2017 17:37   Ct Head Wo Contrast  Result Date: 02/09/2017 CLINICAL DATA:  Subdural hematoma. EXAM: CT HEAD WITHOUT CONTRAST TECHNIQUE: Contiguous axial images were obtained from the base of the skull through the vertex without intravenous contrast. COMPARISON:  MRI brain 01/25/2017 and CT of the head 01/13/2017. FINDINGS: Brain: Bilateral subdural collections have increased in size since the prior exam. The right-sided collection is homogeneously hyperdense. There is mixed density in the left-sided collection with layering blood products posteriorly. The right-sided collection extends along the anterior interhemispheric fissure to a greater extent than the left-sided collection does. There is no significant midline shift. Sulcal effacement aunt partial ventricular effacement is increased from the 03/17 study. No acute infarct is present. The brainstem and cerebellum are within normal limits. Vascular: Atherosclerotic calcifications are present within the cavernous internal carotid arteries bilaterally. The calvarium is intact. Skull: Calvarium is intact. There is no evidence for acute fracture. No significant extracranial soft tissue injury is present. No focal lytic or blastic lesions are present. Sinuses/Orbits: The globes and orbits are within normal limits. The paranasal sinuses and mastoid air cells are clear. IMPRESSION: 1. Slight increase in size of bilateral subdural hematoma is, right greater than left. 2. Homogeneous increased density of the right sided collection suggesting a more acute hemorrhage or ongoing hemorrhage. 3. Layering blood products on the left, more typical of expected evolution of the previous subdural hematoma. 4. Bilateral  mass effect without significant midline shift. These results were called by telephone at the time of interpretation on 02/09/2017 at 8:45 am to Dr. Leta Baptist , who verbally acknowledged these results. Electronically Signed  By: San Morelle M.D.   On: 02/09/2017 08:53        Assessment/Plan  1. Subdural hematoma:  -Consult to Neurosurgery appreciate cares -Neuro checks with vitals only, per Dr. Annette Stable -From a medical standpoint, patient is optimized for surgery, she should continue her statin, BP regimen, holding only her ARB the day of surgery -May have Norco PRN for pain, or tramadol    2. Diarrhea:  Per history, this is laxative induced.   -Hold Laxatives for now  3. Hypertension, history of TIA/cerebrovascular disease:  -Continue amlodipine, losartan -Continue furosemide, K -Continue statin  4. Mood/schizophrenia:  This is well-compensated.  -Continue Bupropion  5. Other medications:  -Continue hydroxyzine -Continue PPI -Continue oxybutynin -Continue Flonase -Continue allopurinol     DVT prophylaxis: SCDs  Code Status: FULL  Family Communication: Daughter at bedside  Disposition Plan: Anticipate observation overnight, further disposition per Neurosurgery Consults called: Neurosurgery Admission status: OBS At the point of initial evaluation, it is my clinical opinion that admission for OBSERVATION is reasonable and necessary because the patient's presenting complaints in the context of their chronic conditions represent sufficient risk of deterioration or significant morbidity to constitute reasonable grounds for close observation in the hospital setting, but that the patient may be medically stable for discharge from the hospital within 24 to 48 hours.    Medical decision making: Patient seen at 10:00 PM on 02/09/2017.  The patient was discussed with Dr. Annette Stable.  What exists of the patient's chart was reviewed in depth and summarized above.  Clinical condition:  stable.        Edwin Dada [facility] Pager [phone]

## 2017-02-10 NOTE — Progress Notes (Signed)
Patient c/o lower abdominal cramping and fullness. LBM 02/10/2017. Patient states she had to strain and BM was very small. Patient reports taking laxative every other day at home in order to stay regular. Patient has ambulated. RN encouraged patient to increase water intake. MD paged. RN will continue to monitor.

## 2017-02-10 NOTE — Progress Notes (Signed)
Patient reports passing gas and belching provided minor alleviation of abdominal discomfort. Patient administered 10 mg Dulcolax suppository per order. KUB obtained. RN will continue to monitor patient.

## 2017-02-11 MED ORDER — DOCUSATE SODIUM 100 MG PO CAPS
100.0000 mg | ORAL_CAPSULE | Freq: Two times a day (BID) | ORAL | Status: DC | PRN
  Administered 2017-02-13: 100 mg via ORAL
  Filled 2017-02-11: qty 1

## 2017-02-11 MED ORDER — CEFAZOLIN SODIUM-DEXTROSE 2-4 GM/100ML-% IV SOLN
2.0000 g | INTRAVENOUS | Status: AC
Start: 2017-02-11 — End: 2017-02-12
  Administered 2017-02-12: 2 g via INTRAVENOUS
  Filled 2017-02-11: qty 100

## 2017-02-11 MED ORDER — POLYETHYLENE GLYCOL 3350 17 G PO PACK
17.0000 g | PACK | Freq: Every day | ORAL | Status: DC
  Administered 2017-02-11 – 2017-02-15 (×4): 17 g via ORAL
  Filled 2017-02-11 (×3): qty 1

## 2017-02-11 MED ORDER — BISACODYL 5 MG PO TBEC: 5.0000 mg | DELAYED_RELEASE_TABLET | Freq: Every day | ORAL | Status: DC | PRN

## 2017-02-11 NOTE — Progress Notes (Signed)
PROGRESS NOTE    [name]  NOB:[id number] DOB: Mar 14, 1941 DOA: 02/09/2017 PCP: Larey Dresser, MD   Brief Narrative: 76 y.o. female with a past medical history significant for schizophrenia well-compensated, OSA not on CPAP, HTN, hx of TIA and gout, recently had fall with ER visit on CT scan of head was unremarkable. Patient then had MRI of the brain ordered by neurologist for another reason when she was found to have incidental bilateral small subdural hematoma. She was initially asymptomatic so it was decided to follow-up outpatient. But next 2- 3 days patient was complaining of new lower extremities weakness, intermittent headache and therefore her neurologist ordered a follow-up CT scan the day prior to admission when she was found to have enlargement of bilateral subdural hematomas with no midline shift. At that time she was sent to the ER for further evaluation including neurosurgery consultation.  Assessment & Plan:  # Bilateral subdural hematoma associated with headache and lower extremities weakness: -Evaluated by neurosurgeon, plan for neurosurgical intervention on Monday. I discussed with Dr. Annette Stable on 4/14. Npo from midnight -Labs unremarkable, coags normal. -Further management as per neurosurgeon -Pain management.  #Hypertension: Continue home medication. Monitor blood pressure. Patient is on Norvasc, Lasix, losartan  #History of schizophrenia: Continue home medication  #Chronic kidney disease is stage III: Serum creatinine level stable, around baseline. Monitor BMP. Avoid nephrotoxins.  # constipation: Abdomen x-ray reviewed consistent with stool burden. Started MiraLAX and Dulcolax, colace prn.  PT, OT evaluation and treatment after the procedure.  Principal Problem:   Subdural hematoma without coma (HCC) Active Problems:   Essential hypertension   Chronic diastolic heart failure (HCC)   Chronic prescription opiate use   Schizophrenia, chronic condition  (HCC)   Chronic kidney disease (CKD), stage III (moderate)   SDH (subdural hematoma) (HCC)  DVT prophylaxis: SCD. No anticoagulation because of subdural hematoma Code Status: Full code Family Communication: Discussed with the patient's daughter at bedside Disposition Plan: Likely discharge home versus rehabilitation in 2-3 days depending on neurosurgeon's plan    Consultants:   Neurosurgery  Procedures: None Antimicrobials: None  Subjective: Patient was seen and examined at bedside. No new event. Headache is better today. Denied dizziness, nausea, vomiting chest pain or shortness of breath. Reported constipation.  Objective: Vitals:   02/10/17 2131 02/11/17 0124 02/11/17 0608 02/11/17 1008  BP: (!) 105/50 117/70 123/68 124/64  Pulse: 67 63 (!) 57 64  Resp: 20 20 20 20   Temp: 99 F (37.2 C) 98.8 F (37.1 C) 98.4 F (36.9 C) 98.2 F (36.8 C)  TempSrc: Oral Oral Oral Oral  SpO2: 94% 98% 96% 96%  Weight:      Height:       No intake or output data in the 24 hours ending 02/11/17 1150 Filed Weights   02/09/17 1526 02/09/17 2353  Weight: 88 kg (194 lb) 86.5 kg (190 lb 12.8 oz)    Examination:  General exam: Sitting on chair comfortably, not in distress  Respiratory system: Clear to auscultation. Respiratory effort normal. No wheezing or crackle Cardiovascular system: S1 & S2 heard, RRR.  No pedal edema. Gastrointestinal system: Abdomen soft, nontender, nondistended. Bowel sound positive Central nervous system: Alert and oriented. No focal neurological deficit  Extremities: Symmetric 5 x 5 power. Skin: No rashes, lesions or ulcers Psychiatry: Judgement and insight appear normal. Mood & affect appropriate.     Data Reviewed: I have personally reviewed following labs and imaging studies  CBC:  Recent Labs Lab 02/09/17 1545 02/09/17  1559  WBC 7.0  --   NEUTROABS 4.1  --   HGB 10.9* 11.9*  HCT 33.9* 35.0*  MCV 95.5  --   PLT 226  --    Basic Metabolic  Panel:  Recent Labs Lab 02/09/17 1545 02/09/17 1559  NA 141 140  K 4.0 4.0  CL 104 107  CO2 23  --   GLUCOSE 100* 97  BUN 19 21*  CREATININE 1.33* 1.40*  CALCIUM 10.2  --    GFR: Estimated Creatinine Clearance: 36.9 mL/min (A) (by C-G formula based on SCr of 1.4 mg/dL (H)). Liver Function Tests:  Recent Labs Lab 02/09/17 1545  AST 26  ALT 22  ALKPHOS 116  BILITOT 0.5  PROT 7.1  ALBUMIN 4.1   No results for input(s): LIPASE, AMYLASE in the last 168 hours. No results for input(s): AMMONIA in the last 168 hours. Coagulation Profile:  Recent Labs Lab 02/09/17 1545  INR 1.08   Cardiac Enzymes: No results for input(s): CKTOTAL, CKMB, CKMBINDEX, TROPONINI in the last 168 hours. BNP (last 3 results) No results for input(s): PROBNP in the last 8760 hours. HbA1C: No results for input(s): HGBA1C in the last 72 hours. CBG:  Recent Labs Lab 02/09/17 1732  GLUCAP 94   Lipid Profile: No results for input(s): CHOL, HDL, LDLCALC, TRIG, CHOLHDL, LDLDIRECT in the last 72 hours. Thyroid Function Tests: No results for input(s): TSH, T4TOTAL, FREET4, T3FREE, THYROIDAB in the last 72 hours. Anemia Panel: No results for input(s): VITAMINB12, FOLATE, FERRITIN, TIBC, IRON, RETICCTPCT in the last 72 hours. Sepsis Labs: No results for input(s): PROCALCITON, LATICACIDVEN in the last 168 hours.  Recent Results (from the past 240 hour(s))  MRSA PCR Screening     Status: None   Collection Time: 02/10/17  1:38 AM  Result Value Ref Range Status   MRSA by PCR NEGATIVE NEGATIVE Final    Comment:        The GeneXpert MRSA Assay (FDA approved for NASAL specimens only), is one component of a comprehensive MRSA colonization surveillance program. It is not intended to diagnose MRSA infection nor to guide or monitor treatment for MRSA infections.          Radiology Studies: Ct Head Wo Contrast  Result Date: 02/09/2017 CLINICAL DATA:  Recent intracranial hemorrhage,  increased symptoms headache, leg heaviness, generalized weakness, and confusion, history ovarian cancer, cervical cancer, TIA, subdural hematoma, smoker EXAM: CT HEAD WITHOUT CONTRAST TECHNIQUE: Contiguous axial images were obtained from the base of the skull through the vertex without intravenous contrast. COMPARISON:  02/08/2017 FINDINGS: Brain: BILATERAL subdural collections are identified, consistent with subdural hematomas. RIGHT subdural measures 11 mm thick, unchanged and is slightly higher in attenuation than the LEFT subdural hematoma which measures 11 mm thick, previously 10 mm. Mass effect on both hemispheres of the brain with flattening of gyri. No midline shift. Ventricular morphology unchanged. No intraparenchymal hemorrhage, mass lesion, or evidence acute infarction. Vascular: Atherosclerotic calcifications at the carotid siphons. Skull: Intact Sinuses/Orbits: Clear Other: N/A IMPRESSION: BILATERAL subdural hematomas measure 11 mm diameter, with RIGHT previously 11 mm and LEFT previously 10 mm on 02/08/2017. No new intracranial abnormalities. Electronically Signed   By: Lavonia Dana M.D.   On: 02/09/2017 17:37   Dg Abd Portable 1v  Result Date: 02/10/2017 CLINICAL DATA:  Abdominal bloating and cramping. Patient is hurting in her left lower quadrant and hip. EXAM: PORTABLE ABDOMEN - 1 VIEW COMPARISON:  01/03/2015 radiographs FINDINGS: An above-average amount of fecal residue is seen  within the proximal transverse colon at the hepatic flexure. No small nor large bowel obstruction is seen. Gaseous distention of the stomach is noted. There is lumbar spondylitic change as before with facet arthropathy. No acute osseous appearing abnormality. Bilateral uncemented hip arthroplasties are present without evidence of hardware failure or loosening. Cholecystectomy clips are seen in the right upper quadrant. IMPRESSION: Lumbar spondylosis. Intact hip arthroplasties to the extent visualized. Unremarkable bowel  gas pattern apart from increased stool burden within the proximal transverse colon at the hepatic flexure. Electronically Signed   By: Ashley Royalty M.D.   On: 02/10/2017 20:54        Scheduled Meds: . allopurinol  300 mg Oral Daily  . amLODipine  10 mg Oral Daily  . atorvastatin  40 mg Oral Daily  . buPROPion  300 mg Oral q morning - 10a  .  ceFAZolin (ANCEF) IV  2 g Intravenous 30 min Pre-Op  . furosemide  20 mg Oral Daily  . hydrOXYzine  25 mg Oral QHS  . losartan  100 mg Oral Daily  . oxybutynin  5 mg Oral QHS  . pantoprazole  40 mg Oral BID AC  . potassium chloride  10 mEq Oral Daily   Continuous Infusions:   LOS: 1 day    Dron Tanna Furry, MD [facility] Pager [phone]  If 7PM-7AM, please contact night-coverage [email or web address] Password [place] 02/11/2017, 11:50 AM

## 2017-02-11 NOTE — Progress Notes (Signed)
Overall patient stable. She states she rested better last night. She reports that her headache is recent well-controlled. She's having no new symptoms of numbness or weakness.  Afebrile. Vitals are stable. She is awake and alert. She is oriented and appropriate. She has some diffuse weakness of her extremitiesbut otherwise is neurologically intact.  Patient with significant bilateral chronic subdural hematomas. I discussed situation patient. I recommended that she undergo bilateral bur hole evacuation of subdural hematomas in hopes of improving her symptoms. I discussed the risks and benefits involved with this surgery including but not limited to the risk of anesthesia, bleeding, infection, brain injury, seizure, stroke, and even death. I've discussed the chance of reaccumulation of hemorrhage and possible need for further surgery. The patient and her daughter appear to understand. They wish to proceed. Plan to proceed with surgery tomorrow.

## 2017-02-12 ENCOUNTER — Encounter (HOSPITAL_COMMUNITY): Payer: Self-pay | Admitting: Critical Care Medicine

## 2017-02-12 ENCOUNTER — Inpatient Hospital Stay (HOSPITAL_COMMUNITY): Payer: PPO | Admitting: Anesthesiology

## 2017-02-12 ENCOUNTER — Encounter (HOSPITAL_COMMUNITY)
Admission: EM | Disposition: A | Discharge status: Home Health | Payer: Self-pay | Source: Home / Self Care | Attending: Nephrology

## 2017-02-12 DIAGNOSIS — S065X0D Traumatic subdural hemorrhage without loss of consciousness, subsequent encounter: Secondary | ICD-10-CM

## 2017-02-12 HISTORY — PX: BURR HOLE: SHX908

## 2017-02-12 SURGERY — CREATION, CRANIAL BURR HOLE
Anesthesia: General | Laterality: Bilateral

## 2017-02-12 MED ORDER — FENTANYL CITRATE (PF) 100 MCG/2ML IJ SOLN
INTRAMUSCULAR | Status: DC | PRN
  Administered 2017-02-12 (×2): 25 ug via INTRAVENOUS
  Administered 2017-02-12 (×2): 50 ug via INTRAVENOUS
  Administered 2017-02-12: 25 ug via INTRAVENOUS

## 2017-02-12 MED ORDER — SODIUM CHLORIDE 0.9 % IV SOLN
INTRAVENOUS | Status: DC
  Administered 2017-02-12: 17:00:00 via INTRAVENOUS

## 2017-02-12 MED ORDER — ONDANSETRON HCL 4 MG PO TABS: 4.0000 mg | ORAL_TABLET | ORAL | Status: DC | PRN

## 2017-02-12 MED ORDER — LACTATED RINGERS IV SOLN
INTRAVENOUS | Status: DC | PRN
  Administered 2017-02-12: 17:00:00 via INTRAVENOUS

## 2017-02-12 MED ORDER — ONDANSETRON HCL 4 MG/2ML IJ SOLN
INTRAMUSCULAR | Status: DC | PRN
  Administered 2017-02-12: 4 mg via INTRAVENOUS

## 2017-02-12 MED ORDER — LABETALOL HCL 5 MG/ML IV SOLN: 10.0000 mg | INTRAVENOUS | Status: DC | PRN

## 2017-02-12 MED ORDER — BACITRACIN ZINC 500 UNIT/GM EX OINT
TOPICAL_OINTMENT | CUTANEOUS | Status: AC
  Filled 2017-02-12: qty 28.35

## 2017-02-12 MED ORDER — FENTANYL CITRATE (PF) 100 MCG/2ML IJ SOLN: 25.0000 ug | INTRAMUSCULAR | Status: DC | PRN

## 2017-02-12 MED ORDER — THROMBIN 20000 UNITS EX SOLR
CUTANEOUS | Status: AC
  Filled 2017-02-12: qty 20000

## 2017-02-12 MED ORDER — FENTANYL CITRATE (PF) 250 MCG/5ML IJ SOLN
INTRAMUSCULAR | Status: AC
  Filled 2017-02-12: qty 5

## 2017-02-12 MED ORDER — SODIUM CHLORIDE 0.9 % IR SOLN
Status: DC | PRN
  Administered 2017-02-12: 18:00:00

## 2017-02-12 MED ORDER — NALOXONE HCL 0.4 MG/ML IJ SOLN: 0.0800 mg | INTRAMUSCULAR | Status: DC | PRN

## 2017-02-12 MED ORDER — PROMETHAZINE HCL 12.5 MG PO TABS
12.5000 mg | ORAL_TABLET | ORAL | Status: DC | PRN
  Filled 2017-02-12: qty 2

## 2017-02-12 MED ORDER — LIDOCAINE-EPINEPHRINE 1 %-1:100000 IJ SOLN
INTRAMUSCULAR | Status: DC | PRN
  Administered 2017-02-12: 10 mL

## 2017-02-12 MED ORDER — SUGAMMADEX SODIUM 200 MG/2ML IV SOLN
INTRAVENOUS | Status: DC | PRN
  Administered 2017-02-12: 200 mg via INTRAVENOUS

## 2017-02-12 MED ORDER — HYDROMORPHONE HCL 1 MG/ML IJ SOLN
0.5000 mg | INTRAMUSCULAR | Status: DC | PRN
  Administered 2017-02-12 – 2017-02-13 (×2): 0.5 mg via INTRAVENOUS
  Administered 2017-02-13 (×2): 1 mg via INTRAVENOUS
  Filled 2017-02-12 (×4): qty 1

## 2017-02-12 MED ORDER — PROPOFOL 10 MG/ML IV BOLUS
INTRAVENOUS | Status: DC | PRN
  Administered 2017-02-12: 80 mg via INTRAVENOUS
  Administered 2017-02-12 (×2): 20 mg via INTRAVENOUS

## 2017-02-12 MED ORDER — THROMBIN 20000 UNITS EX SOLR
CUTANEOUS | Status: DC | PRN
  Administered 2017-02-12: 18:00:00 via TOPICAL

## 2017-02-12 MED ORDER — ROCURONIUM BROMIDE 100 MG/10ML IV SOLN
INTRAVENOUS | Status: DC | PRN
  Administered 2017-02-12: 30 mg via INTRAVENOUS
  Administered 2017-02-12: 10 mg via INTRAVENOUS

## 2017-02-12 MED ORDER — MIDAZOLAM HCL 2 MG/2ML IJ SOLN
INTRAMUSCULAR | Status: AC
Start: 2017-02-12 — End: 2017-02-12
  Filled 2017-02-12: qty 2

## 2017-02-12 MED ORDER — CEFAZOLIN IN D5W 1 GM/50ML IV SOLN
1.0000 g | Freq: Three times a day (TID) | INTRAVENOUS | Status: AC
  Administered 2017-02-13 (×2): 1 g via INTRAVENOUS
  Filled 2017-02-12 (×2): qty 50

## 2017-02-12 MED ORDER — ONDANSETRON HCL 4 MG/2ML IJ SOLN: 4.0000 mg | INTRAMUSCULAR | Status: DC | PRN

## 2017-02-12 MED ORDER — LIDOCAINE-EPINEPHRINE 1 %-1:100000 IJ SOLN
INTRAMUSCULAR | Status: AC
  Filled 2017-02-12: qty 1

## 2017-02-12 MED ORDER — SODIUM CHLORIDE 0.9 % IV SOLN
500.0000 mg | Freq: Two times a day (BID) | INTRAVENOUS | Status: DC
  Administered 2017-02-12 – 2017-02-15 (×6): 500 mg via INTRAVENOUS
  Filled 2017-02-12 (×8): qty 5

## 2017-02-12 MED ORDER — KCL IN DEXTROSE-NACL 20-5-0.9 MEQ/L-%-% IV SOLN
INTRAVENOUS | Status: DC
  Administered 2017-02-12 – 2017-02-13 (×2): via INTRAVENOUS
  Filled 2017-02-12 (×4): qty 1000

## 2017-02-12 MED ORDER — LIDOCAINE HCL (CARDIAC) 20 MG/ML IV SOLN
INTRAVENOUS | Status: DC | PRN
  Administered 2017-02-12: 60 mg via INTRAVENOUS

## 2017-02-12 MED ORDER — 0.9 % SODIUM CHLORIDE (POUR BTL) OPTIME
TOPICAL | Status: DC | PRN
  Administered 2017-02-12: 1000 mL

## 2017-02-12 MED ORDER — SODIUM CHLORIDE 0.9 % IV SOLN
INTRAVENOUS | Status: DC | PRN
  Administered 2017-02-12: 18:00:00 via INTRAVENOUS

## 2017-02-12 SURGICAL SUPPLY — 62 items
BAG DECANTER FOR FLEXI CONT (MISCELLANEOUS) ×2 IMPLANT
BANDAGE GAUZE 4  KLING STR (GAUZE/BANDAGES/DRESSINGS) IMPLANT
BLADE CLIPPER SURG (BLADE) IMPLANT
BLADE SURG 11 STRL SS (BLADE) ×2 IMPLANT
BNDG COHESIVE 4X5 TAN NS LF (GAUZE/BANDAGES/DRESSINGS) IMPLANT
BUR ACORN 6.0 (BURR) ×2 IMPLANT
CANISTER SUCT 3000ML PPV (MISCELLANEOUS) ×2 IMPLANT
CARTRIDGE OIL MAESTRO DRILL (MISCELLANEOUS) ×1 IMPLANT
CLIP TI MEDIUM 6 (CLIP) IMPLANT
CORDS BIPOLAR (ELECTRODE) ×2 IMPLANT
DECANTER SPIKE VIAL GLASS SM (MISCELLANEOUS) ×2 IMPLANT
DERMABOND ADVANCED (GAUZE/BANDAGES/DRESSINGS) ×1
DERMABOND ADVANCED .7 DNX12 (GAUZE/BANDAGES/DRESSINGS) ×1 IMPLANT
DIFFUSER DRILL AIR PNEUMATIC (MISCELLANEOUS) ×2 IMPLANT
DRAIN HEMOVAC 1/8 X 5 (WOUND CARE) ×2 IMPLANT
DRAPE NEUROLOGICAL W/INCISE (DRAPES) IMPLANT
DRAPE WARM FLUID 44X44 (DRAPE) ×2 IMPLANT
ELECT CAUTERY BLADE 6.4 (BLADE) ×2 IMPLANT
ELECT REM PT RETURN 9FT ADLT (ELECTROSURGICAL) ×2
ELECTRODE REM PT RTRN 9FT ADLT (ELECTROSURGICAL) ×1 IMPLANT
EVACUATOR SILICONE 100CC (DRAIN) ×2 IMPLANT
GAUZE SPONGE 4X4 12PLY STRL (GAUZE/BANDAGES/DRESSINGS) ×2 IMPLANT
GAUZE SPONGE 4X4 16PLY XRAY LF (GAUZE/BANDAGES/DRESSINGS) IMPLANT
GLOVE BIOGEL PI IND STRL 7.0 (GLOVE) ×2 IMPLANT
GLOVE BIOGEL PI IND STRL 7.5 (GLOVE) ×2 IMPLANT
GLOVE BIOGEL PI INDICATOR 7.0 (GLOVE) ×2
GLOVE BIOGEL PI INDICATOR 7.5 (GLOVE) ×2
GLOVE ECLIPSE 9.0 STRL (GLOVE) ×4 IMPLANT
GLOVE EXAM NITRILE LRG STRL (GLOVE) IMPLANT
GLOVE EXAM NITRILE XL STR (GLOVE) IMPLANT
GLOVE EXAM NITRILE XS STR PU (GLOVE) IMPLANT
GLOVE SS N UNI LF 7.5 STRL (GLOVE) ×2 IMPLANT
GOWN STRL REUS W/ TWL LRG LVL3 (GOWN DISPOSABLE) ×1 IMPLANT
GOWN STRL REUS W/ TWL XL LVL3 (GOWN DISPOSABLE) ×1 IMPLANT
GOWN STRL REUS W/TWL 2XL LVL3 (GOWN DISPOSABLE) ×2 IMPLANT
GOWN STRL REUS W/TWL LRG LVL3 (GOWN DISPOSABLE) ×1
GOWN STRL REUS W/TWL XL LVL3 (GOWN DISPOSABLE) ×1
HEMOSTAT SURGICEL 2X14 (HEMOSTASIS) IMPLANT
HOOK DURA (MISCELLANEOUS) ×2 IMPLANT
KIT BASIN OR (CUSTOM PROCEDURE TRAY) ×2 IMPLANT
KIT ROOM TURNOVER OR (KITS) ×2 IMPLANT
NEEDLE HYPO 25X1 1.5 SAFETY (NEEDLE) ×2 IMPLANT
NS IRRIG 1000ML POUR BTL (IV SOLUTION) ×2 IMPLANT
OIL CARTRIDGE MAESTRO DRILL (MISCELLANEOUS) ×2
PACK CRANIOTOMY (CUSTOM PROCEDURE TRAY) ×2 IMPLANT
PAD ARMBOARD 7.5X6 YLW CONV (MISCELLANEOUS) ×6 IMPLANT
PATTIES SURGICAL .25X.25 (GAUZE/BANDAGES/DRESSINGS) IMPLANT
PATTIES SURGICAL .5 X.5 (GAUZE/BANDAGES/DRESSINGS) IMPLANT
PATTIES SURGICAL .5 X3 (DISPOSABLE) IMPLANT
PATTIES SURGICAL 1X1 (DISPOSABLE) IMPLANT
PIN MAYFIELD SKULL DISP (PIN) IMPLANT
SPONGE NEURO XRAY DETECT 1X3 (DISPOSABLE) IMPLANT
SPONGE SURGIFOAM ABS GEL 100 (HEMOSTASIS) IMPLANT
STAPLER VISISTAT 35W (STAPLE) ×2 IMPLANT
STOCKINETTE TUBULAR 6 INCH (GAUZE/BANDAGES/DRESSINGS) ×2 IMPLANT
SUT NURALON 4 0 TR CR/8 (SUTURE) ×4 IMPLANT
SUT VIC AB 2-0 CT1 18 (SUTURE) ×2 IMPLANT
SYR CONTROL 10ML LL (SYRINGE) ×2 IMPLANT
TOWEL GREEN STERILE (TOWEL DISPOSABLE) ×2 IMPLANT
TOWEL GREEN STERILE FF (TOWEL DISPOSABLE) ×2 IMPLANT
TRAY FOLEY W/METER SILVER 16FR (SET/KITS/TRAYS/PACK) IMPLANT
WATER STERILE IRR 1000ML POUR (IV SOLUTION) ×2 IMPLANT

## 2017-02-12 NOTE — Anesthesia Procedure Notes (Signed)
Procedure Name: Intubation Date/Time: 02/12/2017 5:24 PM Performed by: Shirlyn Goltz Pre-anesthesia Checklist: Patient identified, Emergency Drugs available, Suction available and Patient being monitored Patient Re-evaluated:Patient Re-evaluated prior to inductionOxygen Delivery Method: Circle system utilized Preoxygenation: Pre-oxygenation with 100% oxygen Intubation Type: IV induction Ventilation: Mask ventilation without difficulty Laryngoscope Size: Mac and 3 Grade View: Grade I Tube type: Oral Tube size: 7.0 mm Number of attempts: 1 Airway Equipment and Method: Stylet Placement Confirmation: ETT inserted through vocal cords under direct vision,  positive ETCO2 and breath sounds checked- equal and bilateral Secured at: 21 cm Tube secured with: Tape Dental Injury: Teeth and Oropharynx as per pre-operative assessment

## 2017-02-12 NOTE — Brief Op Note (Signed)
02/09/2017 - 02/12/2017  6:21 PM  PATIENT:  [name]  76 y.o. female  PRE-OPERATIVE DIAGNOSIS:  Bilateral subacute/chronic subdural hematomas  POST-OPERATIVE DIAGNOSIS:  * No post-op diagnosis entered *  PROCEDURE:  Procedure(s): BURR HOLES (Bilateral)  SURGEON:  Surgeon(s) and Role:    * Earnie Larsson, MD - Primary    * Kevan Ny Ditty, MD - Assisting  PHYSICIAN ASSISTANT:   ASSISTANTS:    ANESTHESIA:   general  EBL:  Total I/O In: 200 [I.V.:200] Out: 50 [Blood:50]  BLOOD ADMINISTERED:none  DRAINS: (10 mm) Blake drain(s) in the subdural spaxce on Left   LOCAL MEDICATIONS USED:  XYLOCAINE   SPECIMEN:  No Specimen  DISPOSITION OF SPECIMEN:  N/A  COUNTS:  YES  TOURNIQUET:  * No tourniquets in log *  DICTATION: .Dragon Dictation  PLAN OF CARE: Admit to inpatient   PATIENT DISPOSITION:  PACU - hemodynamically stable.   Delay start of Pharmacological VTE agent (>24hrs) due to surgical blood loss or risk of bleeding: yes

## 2017-02-12 NOTE — H&P (View-Only) (Signed)
Overall patient stable. She states she rested better last night. She reports that her headache is recent well-controlled. She's having no new symptoms of numbness or weakness.  Afebrile. Vitals are stable. She is awake and alert. She is oriented and appropriate. She has some diffuse weakness of her extremitiesbut otherwise is neurologically intact.  Patient with significant bilateral chronic subdural hematomas. I discussed situation patient. I recommended that she undergo bilateral bur hole evacuation of subdural hematomas in hopes of improving her symptoms. I discussed the risks and benefits involved with this surgery including but not limited to the risk of anesthesia, bleeding, infection, brain injury, seizure, stroke, and even death. I've discussed the chance of reaccumulation of hemorrhage and possible need for further surgery. The patient and her daughter appear to understand. They wish to proceed. Plan to proceed with surgery tomorrow.

## 2017-02-12 NOTE — Anesthesia Preprocedure Evaluation (Addendum)
Anesthesia Evaluation  Patient identified by MRN, date of birth, ID band Patient awake    Reviewed: Allergy & Precautions, H&P , NPO status , Patient's Chart, lab work & pertinent test results  Airway Mallampati: III  TM Distance: >3 FB Neck ROM: Full    Dental no notable dental hx. (+) Edentulous Upper, Edentulous Lower, Dental Advisory Given   Pulmonary neg pulmonary ROS, former smoker,    Pulmonary exam normal breath sounds clear to auscultation       Cardiovascular hypertension, Pt. on medications  Rhythm:Regular Rate:Normal     Neuro/Psych Subdural hematoma negative psych ROS   GI/Hepatic Neg liver ROS, GERD  Medicated and Controlled,  Endo/Other  negative endocrine ROS  Renal/GU Renal InsufficiencyRenal disease  negative genitourinary   Musculoskeletal  (+) Arthritis , Osteoarthritis,    Abdominal   Peds  Hematology negative hematology ROS (+) anemia ,   Anesthesia Other Findings   Reproductive/Obstetrics negative OB ROS                           Anesthesia Physical Anesthesia Plan  ASA: III  Anesthesia Plan: General   Post-op Pain Management:    Induction: Intravenous  Airway Management Planned: Oral ETT  Additional Equipment:   Intra-op Plan:   Post-operative Plan: Extubation in OR  Informed Consent: I have reviewed the patients History and Physical, chart, labs and discussed the procedure including the risks, benefits and alternatives for the proposed anesthesia with the patient or authorized representative who has indicated his/her understanding and acceptance.   Dental advisory given  Plan Discussed with: CRNA  Anesthesia Plan Comments:         Anesthesia Quick Evaluation

## 2017-02-12 NOTE — Care Management Note (Signed)
Case Management Note  Patient Details  Name: [name] MRN: [id number] Date of Birth: 18-Aug-1941  Subjective/Objective:  Pt admitted with bilateral subdural hematomas. She is from home alone.                  Action/Plan: Plan is for patient to go to OR today. CM following for d/c needs, physician orders.   Expected Discharge Date:                  Expected Discharge Plan:     In-House Referral:     Discharge planning Services     Post Acute Care Choice:    Choice offered to:     DME Arranged:    DME Agency:     HH Arranged:    HH Agency:     Status of Service:  In process, will continue to follow  If discussed at Long Length of Stay Meetings, dates discussed:    Additional Comments:  Pollie Friar, RN 02/12/2017, 10:31 AM

## 2017-02-12 NOTE — Transfer of Care (Signed)
Immediate Anesthesia Transfer of Care Note  Patient: [name]  Procedure(s) Performed: Procedure(s): BURR HOLES (Bilateral)  Patient Location: PACU  Anesthesia Type:General  Level of Consciousness: awake  Airway & Oxygen Therapy: Patient Spontanous Breathing and Patient connected to nasal cannula oxygen  Post-op Assessment: Report given to RN and Post -op Vital signs reviewed and stable  Post vital signs: Reviewed and stable  Last Vitals:  Vitals:   02/12/17 0900 02/12/17 1434  BP: (!) 143/75 (!) 144/76  Pulse: 66 61  Resp:  18  Temp: 37.4 C 37.2 C    Last Pain:  Vitals:   02/12/17 1434  TempSrc: Oral  PainSc:          Complications: No apparent anesthesia complications

## 2017-02-12 NOTE — Op Note (Signed)
Date of procedure: 02/13/2012  Date of dictation: Same  Service: Neurosurgery  Preoperative diagnosis: Bilateral chronic subdural hematoma  Postoperative diagnosis: Same  Procedure Name: Bilateral frontal burr hole evacuation of subdural hematoma. Placement of the left side frontal subdural drain  Surgeon:Amir Fick A.Aziah Brostrom, M.D.  Asst. Surgeon: Ditty  Anesthesia: General  Indication: 76 year old female status post fall proximal May 6 weeks ago. Patient with progressive headaches and weakness. Workup demonstrates evidence of enlarging bilateral subdural hematomas with mass effect. Patient presents now for burr hole evacuation and hopes of improving her symptoms  Operative note: After induction of anesthesia, patient position supine with neck slightly extended. Patient's frontal scalp was prepped and draped sterilely. Local lidocaine is infiltrated in the planned incision sites. Frontal incisions were made bilaterally. Retractor placed. By lateral frontal bur holes were performed. Dura was coagulated bilaterally. There is incised in a cruciate fashion. Dural leaflets of burn back to the edges of the bur hole sites. Subdural fluid was allowed to drain under pressure. Subdural fluid is then irrigated clean. No evidence of active bleeding. Subdural membranes were elevated and incised bilaterally. Underlying cortex looks good. Left frontal subdural drain was placed. This was exited through separate stab incision. Both wounds are then closed in a typical fashion. No apparent complications. Patient tolerated the procedure well and she returns to the recovery room postop.

## 2017-02-12 NOTE — Progress Notes (Signed)
PROGRESS NOTE    [name]  YNW:[id number] DOB: Jun 19, 1941 DOA: 02/09/2017 PCP: Larey Dresser, MD   Brief Narrative: 76 y.o. female with a past medical history significant for schizophrenia well-compensated, OSA not on CPAP, HTN, hx of TIA and gout, recently had fall with ER visit on CT scan of head was unremarkable. Patient then had MRI of the brain ordered by neurologist for another reason when she was found to have incidental bilateral small subdural hematoma. She was initially asymptomatic so it was decided to follow-up outpatient. But next 2- 3 days patient was complaining of new lower extremities weakness, intermittent headache and therefore her neurologist ordered a follow-up CT scan the day prior to admission when she was found to have enlargement of bilateral subdural hematomas with no midline shift. At that time she was sent to the ER for further evaluation including neurosurgery consultation.  Assessment & Plan:  # Bilateral subdural hematoma associated with headache and lower extremities weakness: -Evaluated by neurosurgeon, plan for neurosurgical intervention today. Pt is NPO -Labs unremarkable, coags normal. -Further management as per neurosurgeon -Pain management. -start IVF while on npo.  #Hypertension: Continue home medication. Monitor blood pressure. Patient is on Norvasc, Lasix, losartan. Oral meds on hold today because of npo status.   #History of schizophrenia: Continue home medication  #Chronic kidney disease is stage III: Serum creatinine level stable, around baseline. Monitor BMP. Avoid nephrotoxins.  # constipation: Abdomen x-ray reviewed consistent with stool burden. Started MiraLAX and Dulcolax, colace prn.  PT, OT evaluation and treatment after the procedure.  Principal Problem:   Subdural hematoma without coma (HCC) Active Problems:   Essential hypertension   Chronic diastolic heart failure (HCC)   Chronic prescription opiate use  Schizophrenia, chronic condition (HCC)   Chronic kidney disease (CKD), stage III (moderate)   SDH (subdural hematoma) (HCC)  DVT prophylaxis: SCD. No anticoagulation because of subdural hematoma Code Status: Full code Family Communication: Discussed with the patient's daughter at bedside at length. Disposition Plan: Likely discharge home versus rehabilitation in 2-3 days depending on neurosurgeon's plan    Consultants:   Neurosurgery  Procedures: None Antimicrobials: None  Subjective: Patient was seen and examined at bedside. No new event. Waiting for the OR today. Has headache, stable. Denied dizziness, chest pain, shortness of breath, nausea or vomiting.  Objective: Vitals:   02/11/17 2150 02/12/17 0116 02/12/17 0500 02/12/17 0900  BP: 139/70 121/61 124/63 (!) 143/75  Pulse: 63 70 72 66  Resp: 20 18 18    Temp: 98.4 F (36.9 C) 99.6 F (37.6 C) 98.9 F (37.2 C) 99.4 F (37.4 C)  TempSrc: Oral Oral Oral Oral  SpO2: 98% 97% 98% 95%  Weight:      Height:        Intake/Output Summary (Last 24 hours) at 02/12/17 1000 Last data filed at 02/11/17 1219  Gross per 24 hour  Intake              240 ml  Output                0 ml  Net              240 ml   Filed Weights   02/09/17 1526 02/09/17 2353  Weight: 88 kg (194 lb) 86.5 kg (190 lb 12.8 oz)    Examination:  General exam: Not in distress  Respiratory system: Clear bilateral. Respiratory effort normal. No wheezing or crackle Cardiovascular system: Regular rate rhythm, S1-S2 normal. No pedal edema Gastrointestinal  system: Abdomen soft, nontender, nondistended. Bowel sound positive Central nervous system: Alert awake and following commands. No focal neurological deficit. Extremities: Symmetric 5 over 5 in all extremities Skin: No rashes, lesions or ulcers Psychiatry: Judgement and insight appear normal. Mood & affect appropriate.     Data Reviewed: I have personally reviewed following labs and imaging  studies  CBC:  Recent Labs Lab 02/09/17 1545 02/09/17 1559  WBC 7.0  --   NEUTROABS 4.1  --   HGB 10.9* 11.9*  HCT 33.9* 35.0*  MCV 95.5  --   PLT 226  --    Basic Metabolic Panel:  Recent Labs Lab 02/09/17 1545 02/09/17 1559  NA 141 140  K 4.0 4.0  CL 104 107  CO2 23  --   GLUCOSE 100* 97  BUN 19 21*  CREATININE 1.33* 1.40*  CALCIUM 10.2  --    GFR: Estimated Creatinine Clearance: 36.9 mL/min (A) (by C-G formula based on SCr of 1.4 mg/dL (H)). Liver Function Tests:  Recent Labs Lab 02/09/17 1545  AST 26  ALT 22  ALKPHOS 116  BILITOT 0.5  PROT 7.1  ALBUMIN 4.1   No results for input(s): LIPASE, AMYLASE in the last 168 hours. No results for input(s): AMMONIA in the last 168 hours. Coagulation Profile:  Recent Labs Lab 02/09/17 1545  INR 1.08   Cardiac Enzymes: No results for input(s): CKTOTAL, CKMB, CKMBINDEX, TROPONINI in the last 168 hours. BNP (last 3 results) No results for input(s): PROBNP in the last 8760 hours. HbA1C: No results for input(s): HGBA1C in the last 72 hours. CBG:  Recent Labs Lab 02/09/17 1732  GLUCAP 94   Lipid Profile: No results for input(s): CHOL, HDL, LDLCALC, TRIG, CHOLHDL, LDLDIRECT in the last 72 hours. Thyroid Function Tests: No results for input(s): TSH, T4TOTAL, FREET4, T3FREE, THYROIDAB in the last 72 hours. Anemia Panel: No results for input(s): VITAMINB12, FOLATE, FERRITIN, TIBC, IRON, RETICCTPCT in the last 72 hours. Sepsis Labs: No results for input(s): PROCALCITON, LATICACIDVEN in the last 168 hours.  Recent Results (from the past 240 hour(s))  MRSA PCR Screening     Status: None   Collection Time: 02/10/17  1:38 AM  Result Value Ref Range Status   MRSA by PCR NEGATIVE NEGATIVE Final    Comment:        The GeneXpert MRSA Assay (FDA approved for NASAL specimens only), is one component of a comprehensive MRSA colonization surveillance program. It is not intended to diagnose MRSA infection nor to  guide or monitor treatment for MRSA infections.          Radiology Studies: Dg Abd Portable 1v  Result Date: 02/10/2017 CLINICAL DATA:  Abdominal bloating and cramping. Patient is hurting in her left lower quadrant and hip. EXAM: PORTABLE ABDOMEN - 1 VIEW COMPARISON:  01/03/2015 radiographs FINDINGS: An above-average amount of fecal residue is seen within the proximal transverse colon at the hepatic flexure. No small nor large bowel obstruction is seen. Gaseous distention of the stomach is noted. There is lumbar spondylitic change as before with facet arthropathy. No acute osseous appearing abnormality. Bilateral uncemented hip arthroplasties are present without evidence of hardware failure or loosening. Cholecystectomy clips are seen in the right upper quadrant. IMPRESSION: Lumbar spondylosis. Intact hip arthroplasties to the extent visualized. Unremarkable bowel gas pattern apart from increased stool burden within the proximal transverse colon at the hepatic flexure. Electronically Signed   By: Ashley Royalty M.D.   On: 02/10/2017 20:54  Scheduled Meds: . allopurinol  300 mg Oral Daily  . amLODipine  10 mg Oral Daily  . atorvastatin  40 mg Oral Daily  . buPROPion  300 mg Oral q morning - 10a  .  ceFAZolin (ANCEF) IV  2 g Intravenous 30 min Pre-Op  . furosemide  20 mg Oral Daily  . hydrOXYzine  25 mg Oral QHS  . losartan  100 mg Oral Daily  . oxybutynin  5 mg Oral QHS  . pantoprazole  40 mg Oral BID AC  . polyethylene glycol  17 g Oral Daily  . potassium chloride  10 mEq Oral Daily   Continuous Infusions:   LOS: 2 days    Trevino Wyatt Tanna Furry, MD [facility] Pager [phone]  If 7PM-7AM, please contact night-coverage [email or web address] Password [email or web address] 02/12/2017, 10:00 AM

## 2017-02-12 NOTE — Progress Notes (Signed)
   02/12/17 0900  Clinical Encounter Type  Visited With Health care provider  Visit Type Spiritual support  Referral From Nurse  Consult/Referral To Other (Comment) Idelle Crouch)  Spiritual Encounters  Spiritual Needs Other (Comment) (Confession and Communion prior to brain surgery )  Stress Factors  Patient Stress Factors None identified    Chaplain followed up on [facility] consult for priest. No answer on rectory number voicemail not set up. Left voicemail for Rev. Strollo. Also, called [facility] [place] waiting for a call back from them, [place]. Volanda Napoleon, MDiv

## 2017-02-12 NOTE — Interval H&P Note (Signed)
History and Physical Interval Note:  02/12/2017 4:08 PM  [name]  has presented today for surgery, with the diagnosis of Bilateral subacute/chronic subdural hematomas  The various methods of treatment have been discussed with the patient and family. After consideration of risks, benefits and other options for treatment, the patient has consented to  Procedure(s): BURR HOLES (Bilateral) as a surgical intervention .  The patient's history has been reviewed, patient examined, no change in status, stable for surgery.  I have reviewed the patient's chart and labs.  Questions were answered to the patient's satisfaction.     Payne Garske A

## 2017-02-13 ENCOUNTER — Inpatient Hospital Stay (HOSPITAL_COMMUNITY): Payer: PPO

## 2017-02-13 ENCOUNTER — Encounter (HOSPITAL_COMMUNITY): Payer: Self-pay | Admitting: Neurosurgery

## 2017-02-13 ENCOUNTER — Encounter: Payer: Self-pay | Admitting: Internal Medicine

## 2017-02-13 DIAGNOSIS — D649 Anemia, unspecified: Secondary | ICD-10-CM

## 2017-02-13 LAB — CBC
HCT: 29.6 % — ABNORMAL LOW (ref 36.0–46.0)
Hemoglobin: 9.7 g/dL — ABNORMAL LOW (ref 12.0–15.0)
MCH: 30.8 pg (ref 26.0–34.0)
MCHC: 32.8 g/dL (ref 30.0–36.0)
MCV: 94 fL (ref 78.0–100.0)
Platelets: 198 10*3/uL (ref 150–400)
RBC: 3.15 MIL/uL — ABNORMAL LOW (ref 3.87–5.11)
RDW: 15.4 % (ref 11.5–15.5)
WBC: 8.8 10*3/uL (ref 4.0–10.5)

## 2017-02-13 MED ORDER — SODIUM CHLORIDE 0.9 % IV SOLN
500.0000 mL | Freq: Once | INTRAVENOUS | Status: AC
  Administered 2017-02-13: 500 mL via INTRAVENOUS

## 2017-02-13 MED ORDER — OXYCODONE HCL 5 MG PO TABS
5.0000 mg | ORAL_TABLET | Freq: Four times a day (QID) | ORAL | Status: DC | PRN
  Administered 2017-02-14 (×2): 10 mg via ORAL
  Administered 2017-02-14 – 2017-02-15 (×3): 5 mg via ORAL
  Filled 2017-02-13: qty 1
  Filled 2017-02-13 (×4): qty 2

## 2017-02-13 MED ORDER — OXYCODONE HCL 5 MG PO TABS: 10.0000 mg | ORAL_TABLET | ORAL | Status: AC

## 2017-02-13 NOTE — Progress Notes (Signed)
PROGRESS NOTE    MALEEAH Drake  [name] DOB: [birth year] DOA: 02/09/2017 PCP: Larey Dresser, MD   Brief Narrative: 76 y.o. female with a past medical history significant for schizophrenia well-compensated, OSA not on CPAP, HTN, hx of TIA and gout, recently had fall with ER visit on CT scan of head was unremarkable. Patient then had MRI of the brain ordered by neurologist for another reason when she was found to have incidental bilateral small subdural hematoma. She was initially asymptomatic so it was decided to follow-up outpatient. But next 2- 3 days patient was complaining of new lower extremities weakness, intermittent headache and therefore her neurologist ordered a follow-up CT scan the day prior to admission when she was found to have enlargement of bilateral subdural hematomas with no midline shift. At that time she was sent to the ER for further evaluation including neurosurgery consultation.  Assessment & Plan:  # Bilateral subdural hematoma associated with headache and lower extremities weakness: -s/p bilateral frontal burr hole evacuation of subdural hematoma by neurosurgeon. Placement of the left side frontal subdural drain. Patient is clinically stable. Currently in ICU for close monitoring.  -Mild pain at the site of drain otherwise clinically stable. -Continue current pain medications.  #Hypertension: Continue home medication. Monitor blood pressure. Patient is on Norvasc, Lasix, losartan.   #History of schizophrenia: Continue home medication  #Chronic kidney disease is stage III: Serum creatinine level stable, around baseline. Monitor BMP. Avoid nephrotoxins.  # constipation: Abdomen x-ray reviewed consistent with stool burden. Started MiraLAX and Dulcolax, colace prn.  # Normocytic anemia: Drop in hemoglobin today likely due to acute blood loss during surgery. Continue to monitor.  PT, OT evaluation and treatment after the procedure.  Principal Problem:  Subdural hematoma without coma (HCC) Active Problems:   Essential hypertension   Chronic diastolic heart failure (HCC)   Chronic prescription opiate use   Schizophrenia, chronic condition (HCC)   Chronic kidney disease (CKD), stage III (moderate)   SDH (subdural hematoma) (HCC)  DVT prophylaxis: SCD. No anticoagulation because of subdural hematoma Code Status: Full code Family Communication: Discussed with the patient's daughter at bedside at length. Disposition Plan: Likely discharge home versus rehabilitation in 1-2 days depending on neurosurgeon's plan    Consultants:   Neurosurgery  Procedures: None Antimicrobials: None  Subjective: Patient was seen and examined at bedside. Reported mild pain at the site of the drain. Denied nausea, vomiting, dizziness, chest pain or shortness of breath. Daughter at bedside.  Objective: Vitals:   02/13/17 0600 02/13/17 0700 02/13/17 0751 02/13/17 0800  BP: (!) 104/47 (!) 114/51  105/60  Pulse: (!) 57 (!) 56 (!) 54 (!) 52  Resp: 10 11 12  (!) 9  Temp:    98.9 F (37.2 C)  TempSrc:    Oral  SpO2: 94% 94% 95% 97%  Weight:      Height:        Intake/Output Summary (Last 24 hours) at 02/13/17 1014 Last data filed at 02/13/17 0700  Gross per 24 hour  Intake          1525.17 ml  Output              910 ml  Net           615.17 ml   Filed Weights   02/09/17 1526 02/09/17 2353  Weight: 88 kg (194 lb) 86.5 kg (190 lb 12.8 oz)    Examination:  General exam: Not in distress , drain on the left side of  head. The site looks clean no discharge Respiratory system: Clear bilateral. Respiratory effort normal. No wheezing or crackle Cardiovascular system: Regular rate rhythm, S1 and S2 normal. No pedal edema. Gastrointestinal system: Abdomen soft, nontender, nondistended. Bowel sound positive Central nervous system: Alert, awake and following commands. No focal neurological deficit Extremities: Muscle strength 5 over 5 in all  extremities Skin: No rashes, lesions or ulcers Psychiatry: Judgement and insight appear normal. Mood & affect appropriate.     Data Reviewed: I have personally reviewed following labs and imaging studies  CBC:  Recent Labs Lab 02/09/17 1545 02/09/17 1559 02/13/17 0222  WBC 7.0  --  8.8  NEUTROABS 4.1  --   --   HGB 10.9* 11.9* 9.7*  HCT 33.9* 35.0* 29.6*  MCV 95.5  --  94.0  PLT 226  --  295   Basic Metabolic Panel:  Recent Labs Lab 02/09/17 1545 02/09/17 1559  NA 141 140  K 4.0 4.0  CL 104 107  CO2 23  --   GLUCOSE 100* 97  BUN 19 21*  CREATININE 1.33* 1.40*  CALCIUM 10.2  --    GFR: Estimated Creatinine Clearance: 36.9 mL/min (A) (by C-G formula based on SCr of 1.4 mg/dL (H)). Liver Function Tests:  Recent Labs Lab 02/09/17 1545  AST 26  ALT 22  ALKPHOS 116  BILITOT 0.5  PROT 7.1  ALBUMIN 4.1   No results for input(s): LIPASE, AMYLASE in the last 168 hours. No results for input(s): AMMONIA in the last 168 hours. Coagulation Profile:  Recent Labs Lab 02/09/17 1545  INR 1.08   Cardiac Enzymes: No results for input(s): CKTOTAL, CKMB, CKMBINDEX, TROPONINI in the last 168 hours. BNP (last 3 results) No results for input(s): PROBNP in the last 8760 hours. HbA1C: No results for input(s): HGBA1C in the last 72 hours. CBG:  Recent Labs Lab 02/09/17 1732  GLUCAP 94   Lipid Profile: No results for input(s): CHOL, HDL, LDLCALC, TRIG, CHOLHDL, LDLDIRECT in the last 72 hours. Thyroid Function Tests: No results for input(s): TSH, T4TOTAL, FREET4, T3FREE, THYROIDAB in the last 72 hours. Anemia Panel: No results for input(s): VITAMINB12, FOLATE, FERRITIN, TIBC, IRON, RETICCTPCT in the last 72 hours. Sepsis Labs: No results for input(s): PROCALCITON, LATICACIDVEN in the last 168 hours.  Recent Results (from the past 240 hour(s))  MRSA PCR Screening     Status: None   Collection Time: 02/10/17  1:38 AM  Result Value Ref Range Status   MRSA by PCR  NEGATIVE NEGATIVE Final    Comment:        The GeneXpert MRSA Assay (FDA approved for NASAL specimens only), is one component of a comprehensive MRSA colonization surveillance program. It is not intended to diagnose MRSA infection nor to guide or monitor treatment for MRSA infections.          Radiology Studies: Ct Head Wo Contrast  Result Date: 02/13/2017 CLINICAL DATA:  Follow-up exam for subdural hemorrhage. EXAM: CT HEAD WITHOUT CONTRAST TECHNIQUE: Contiguous axial images were obtained from the base of the skull through the vertex without intravenous contrast. COMPARISON:  Prior CT from 02/09/2017. FINDINGS: Brain: Postoperative changes from interval bilateral subdural evacuation seen. Burr holes are now in place at the frontal calvarium bilaterally. Subdural drain in place at the left frontal burr hole. Postoperative pneumocephalus overlies the bilateral frontal convexities, right greater than left. The mixed density right holo hemispheric subdural collection is overall increased in size measuring up to 18 mm and maximal thickness at  the right frontal convexity. This largely reflects gas. The blood component is overall similar in size measuring approximately 11 mm in maximal thickness. No new hemorrhage seen within this collection. Mixed density left holo hemispheric collection is overall slightly increased in size measuring up to 13 mm in maximal thickness at the left parietal convexity. There is acute blood within this collection at the anterior left frontal convexity. Subdural drain overlies the left frontal convexity. Slightly increased right-to-left shift now measuring up to 7 mm. No hydrocephalus. Basilar cisterns remain patent. No other new acute intracranial hemorrhage. No evidence for acute large vessel territory infarct. Vascular: No hyperdense vessel. Skull: Postoperative swelling with emphysema present within the bilateral scalp. Skin staples in place. Bifrontal burr holes in  place. Sinuses/Orbits: Globes and orbital soft tissues within normal limits. Patient is status post lens extraction on the left. Paranasal sinuses and mastoid air cells are clear. Other: None. IMPRESSION: 1. Postoperative changes from interval frontal burr hole evacuation of bilateral subdural hematomas. 2. Slight interval increase in size of mixed density right subdural collection, largely due to postoperative pneumocephalus. Associated right-to-left midline shift now measures. 3. Slight interval increase in size of mixed density left subdural collection, with small amount of acute blood present anteriorly. Subdural drain in place. 4. No other new acute intracranial process. Electronically Signed   By: Jeannine Boga M.D.   On: 02/13/2017 07:13        Scheduled Meds: . allopurinol  300 mg Oral Daily  . amLODipine  10 mg Oral Daily  . atorvastatin  40 mg Oral Daily  . buPROPion  300 mg Oral q morning - 10a  . furosemide  20 mg Oral Daily  . hydrOXYzine  25 mg Oral QHS  . losartan  100 mg Oral Daily  . oxybutynin  5 mg Oral QHS  . pantoprazole  40 mg Oral BID AC  . polyethylene glycol  17 g Oral Daily  . potassium chloride  10 mEq Oral Daily   Continuous Infusions: . sodium chloride 10 mL/hr at 02/13/17 0700  . dextrose 5 % and 0.9 % NaCl with KCl 20 mEq/L 50 mL/hr at 02/13/17 0700  . levETIRAcetam       LOS: 3 days    Dron Tanna Furry, MD [facility] Pager [phone]  If 7PM-7AM, please contact night-coverage [email or web address] Password [email or web address] 02/13/2017, 10:14 AM

## 2017-02-13 NOTE — Anesthesia Postprocedure Evaluation (Signed)
Anesthesia Post Note  Patient: [name]  Procedure(s) Performed: Procedure(s) (LRB): BURR HOLES (Bilateral)  Patient location during evaluation: PACU Anesthesia Type: General Level of consciousness: awake and alert Pain management: pain level controlled Vital Signs Assessment: post-procedure vital signs reviewed and stable Respiratory status: spontaneous breathing, nonlabored ventilation, respiratory function stable and patient connected to nasal cannula oxygen Cardiovascular status: blood pressure returned to baseline and stable Postop Assessment: no signs of nausea or vomiting Anesthetic complications: no       Last Vitals:  Vitals:   02/13/17 1800 02/13/17 2000  BP: 97/75   Pulse: (!) 51   Resp: 19   Temp:  37 C    Last Pain:  Vitals:   02/13/17 2000  TempSrc: Oral  PainSc:                  Lilli Dewald,W. EDMOND

## 2017-02-13 NOTE — Progress Notes (Signed)
Postop day 1. Overall patient doing well. No headache.  Afebrile. Vital signs are stable. Drain output low. Awake and alert. Oriented and appropriate. Cranial nerve function intact bilaterally. Motor 5/5 bilaterally with no pronator drift. Wounds clean and dry. Follow-up head CT scan reveals resolution of bilateral chronic subdural hematomas. They're still some image cranial air on the right side. Drain on the left is functioning well and well-positioned. No other problems noted.  Doing well status post burr hole evacuation of bilateral subdural hematoma. Mobilize today.

## 2017-02-13 NOTE — Progress Notes (Signed)
MD Carolin Sicks at bedside

## 2017-02-13 NOTE — Care Management Note (Signed)
Case Management Note  Patient Details  Name: [name] MRN: [id number] Date of Birth: November 12, 1940  Subjective/Objective:   Pt admitted on 02/09/17 with chronic bilateral SDH, requiring bilateral Burr holes procedure.   PTA, pt resided at home alone; she ambulates with RW, per report, and has a supportive daughter.                    Action/Plan: PT/OT evaluations pending.  Will follow for discharge planning as pt progresses.    Expected Discharge Date:                  Expected Discharge Plan:  [place]  In-House Referral:     Discharge planning Services  CM Consult  Post Acute Care Choice:    Choice offered to:     DME Arranged:    DME Agency:     HH Arranged:    [place] Agency:     Status of Service:  In process, will continue to follow  If discussed at Long Length of Stay Meetings, dates discussed:    Additional Comments:  Reinaldo Raddle, RN, BSN  Trauma/Neuro ICU Case Manager [phone]

## 2017-02-13 NOTE — Progress Notes (Addendum)
Pts daughter called me into room, stated pt cant breathe. Pt is tachypneic, sats 100%. Placed on 2 L. BP WNL, HR WNL. Pt is complaining of chest pain now. (We had recently given her 0.5mg  dilaudid and had just gotten her up OOB to chair. Internal Med MD notified and requested to come see patient. 12 lead EKG done. Bilateral breath sounds clear. Emotional support given. MD to come see pt.

## 2017-02-13 NOTE — Progress Notes (Signed)
Patient was re-evaluated at the bedside for SOB and anxiety.   On arrival, Pt was alert, awake and oriented. No change in neuro exam.  Mental status unchanged.  Lungs Clear b/l, no wheeze RRR s1s2 nl  Vitals: o2 at 100 % while she was placed in 2 liter, repeat BP 119/63 She denied headache, chest pain  EKG with sinus bradycardia  Pt received dilaudid IV at 12:36 PM. It likely dilaudid causing temporary hypotension and mild bradycardia ? SOB.   Plan to dc IV dilaudid. Continue oral pain meds as needed.  Continue to monitor in ICU.  Discussed with patient's RN and pt's daughter at bedside.

## 2017-02-14 LAB — CBC
HCT: 27.2 % — ABNORMAL LOW (ref 36.0–46.0)
Hemoglobin: 8.7 g/dL — ABNORMAL LOW (ref 12.0–15.0)
MCH: 30.6 pg (ref 26.0–34.0)
MCHC: 32 g/dL (ref 30.0–36.0)
MCV: 95.8 fL (ref 78.0–100.0)
Platelets: 164 10*3/uL (ref 150–400)
RBC: 2.84 MIL/uL — ABNORMAL LOW (ref 3.87–5.11)
RDW: 15.7 % — ABNORMAL HIGH (ref 11.5–15.5)
WBC: 7.7 10*3/uL (ref 4.0–10.5)

## 2017-02-14 MED ORDER — SENNOSIDES-DOCUSATE SODIUM 8.6-50 MG PO TABS
1.0000 | ORAL_TABLET | Freq: Two times a day (BID) | ORAL | Status: DC | PRN
  Administered 2017-02-14: 1 via ORAL
  Filled 2017-02-14: qty 1

## 2017-02-14 NOTE — Progress Notes (Signed)
Stopped by to visit w/ pt and/or daughter after seeing another pt. The latter had seemed a bit agitated when she'd come out in hall and asked me to call nurse. Pt was asleep and, per nurse, daughter had become more calm and then left for the day. Will stop back by again another time.    02/14/17 1500  Clinical Encounter Type  Visited With Family;Patient not available;Health care provider  Visit Type Initial;Psychological support;Spiritual support;Social support  Referral From Chaplain  Spiritual Encounters  Spiritual Needs Emotional  Stress Factors  Patient Stress Factors Health changes;Loss of control  Family Stress Factors Family relationships;Health changes;Loss of control   Gerrit Heck, Chaplain

## 2017-02-14 NOTE — Progress Notes (Addendum)
Occupational Therapy Evaluation Patient Details Name: [name] MRN: [id number] DOB: 1940/11/27 Today's Date: 02/14/2017    History of Present Illness [name] is a 76 y.o. female with a past medical history significant for schizophrenia well-compensated, OSA not on CPAP, HTN, hx of TIA and gout who presents with worsening B subdural hematoma. Pt s/p B burr holes.   Clinical Impression   PTA, pt was independent with basic ADL and required assistance from daughter for home management tasks. Pt currently demonstrates fair dynamic sitting balance for ADL without UE support. Pt requiring maximum encouragement and assistance for ADL this session as she became highly agitated when being asked to move. Ultimately, pt closing eyes and refusing to participate in further mobility. She reports fear over bleeding from drain site when MD removed this earlier in the day and generally "not feeling well." Vital signs stable throughout session. Pt's daughter present and reports that pt has been demonstrating decreased short-term memory this hospitalization and that her behavior is not at baseline. Per RN, pt with agitated behavior overnight as well. Pt's daughter is planning on moving in with pt post-acute D/C and will be able to provide 24 hour assistance. Depending on progress, pt may be able to D/C home with daughter and home health OT services safely. However, if current limitations persist, pt would benefit from SNF placement for continued rehabilitation. OT will continue to follow while admitted and update D/C recommendations as necessary.     Follow Up Recommendations  Home health OT;Supervision/Assistance - 24 hour    Equipment Recommendations  3 in 1 bedside commode    Recommendations for Other Services       Precautions / Restrictions Precautions Precautions: Fall Restrictions Weight Bearing Restrictions: No      Mobility Bed Mobility Overal bed mobility: Needs Assistance Bed  Mobility: Rolling;Sidelying to Sit Rolling: Mod assist Sidelying to sit: Mod assist       General bed mobility comments: Sitting in chair on OT arrival.  Transfers Overall transfer level: Needs assistance Equipment used: Rolling walker (2 wheeled) Transfers: Sit to/from Stand Sit to Stand: Mod assist         General transfer comment: With maximum encouragement pt declining to participate in ADL transfers this session.    Balance Overall balance assessment: Needs assistance Sitting-balance support: No upper extremity supported;Feet supported Sitting balance-Leahy Scale: Fair     Standing balance support: Bilateral upper extremity supported Standing balance-Leahy Scale: Poor Standing balance comment: UE support needed for balance                           ADL either performed or assessed with clinical judgement   ADL Overall ADL's : Needs assistance/impaired                                       General ADL Comments: Pt unwilling to participate in ADL this session. She was able to sit at edge of chair without back support for approximately 5 minutes. Requiring max assist for all ADL at this time due to limited participation.     Vision   Additional Comments: Pt initially able to make eye contact with therapist and utilize vision functionally. When therapist returned to assist pt to wheelchair for transfer to floor, pt closing eyes tightly and not willing to open them despite maximum encouragement.  Perception     Praxis      Pertinent Vitals/Pain Pain Assessment: Faces Faces Pain Scale: Hurts even more Pain Location: head Pain Descriptors / Indicators: Aching Pain Intervention(s): Limited activity within patient's tolerance;Monitored during session     Hand Dominance Right   Extremity/Trunk Assessment Upper Extremity Assessment Upper Extremity Assessment: Generalized weakness (Difficult to assess as pt refusing to participate.)    Lower Extremity Assessment Lower Extremity Assessment: Defer to PT evaluation       Communication Communication Communication: HOH   Cognition Arousal/Alertness: Awake/alert Behavior During Therapy: Agitated Overall Cognitive Status: Impaired/Different from baseline Area of Impairment: Attention;Following commands;Safety/judgement;Problem solving;Awareness;Memory                   Current Attention Level: Selective Memory: Decreased short-term memory Following Commands: Follows one step commands inconsistently Safety/Judgement: Decreased awareness of safety;Decreased awareness of deficits Awareness: Emergent Problem Solving: Slow processing;Difficulty sequencing;Decreased initiation;Requires verbal cues;Requires tactile cues General Comments: Pt becoming agitated despite best efforts to motivate to participate in ADL this session. Pt with escalating behavior and yelling. Ultimately refused to respond to therapist and nursing staff despite encouragement and reassurance that therapist would assist and prevent falling. Pt reporting fear over bleeding when drain removed eariler this date. Per daughter, behavior is not baseline at this time and pt with decreased short-term memory.    General Comments  MD in to remove dressings on pt's head and removed drain on L side.  Noted drops of blood in bathroom, RN in to re-dress area; daughter present throughout and reports able to support at d/c.,    Exercises     Shoulder Instructions      Home Living Family/patient expects to be discharged to:: Private residence Living Arrangements: Alone Available Help at Discharge: Family;Available 24 hours/day (daughter moving in at D/C) Type of Home: House Home Access: Ramped entrance (4 steps with rail in back)     Home Layout: One level     Bathroom Shower/Tub: Walk-in shower         Home Equipment: Environmental consultant - 4 wheels;Cane - single point   Additional Comments: has an aide 3 days a  week      Prior Functioning/Environment Level of Independence: Independent with assistive device(s)        Comments: prior to recent fall was using cane or rollator for ambulation independently; was doing some home managment with daughter sometimes assisting        OT Problem List: Decreased strength;Decreased activity tolerance;Impaired balance (sitting and/or standing);Decreased cognition;Decreased safety awareness;Decreased knowledge of use of DME or AE;Decreased knowledge of precautions;Pain      OT Treatment/Interventions: Self-care/ADL training;Therapeutic exercise;Energy conservation;DME and/or AE instruction;Therapeutic activities;Patient/family education;Balance training    OT Goals(Current goals can be found in the care plan section) Acute Rehab OT Goals Patient Stated Goal: To return to independent OT Goal Formulation: With family Time For Goal Achievement: 02/28/17 Potential to Achieve Goals: Good ADL Goals Pt Will Perform Grooming: with supervision;standing Pt Will Transfer to Toilet: with supervision;ambulating Pt Will Perform Toileting - Clothing Manipulation and hygiene: with supervision;sit to/from stand Pt Will Perform Tub/Shower Transfer: with supervision;rolling walker;ambulating;3 in 1 Additional ADL Goal #1: Pt will demonstrate anticipatory awareness during morning ADL routine in a moderately distracting environment. Additional ADL Goal #2: Pt will demonstrate improved problem solving and emotional regulation skills to complete novel ADL task with minimum VC's for behavior management.  OT Frequency: Min 2X/week   Barriers to D/C:  Co-evaluation              End of Session Nurse Communication: Mobility status  Activity Tolerance: Treatment limited secondary to agitation Patient left: in chair;with call bell/phone within reach;with nursing/sitter in room  OT Visit Diagnosis: Muscle weakness (generalized) (M62.81);Other symptoms and signs  involving cognitive function                Time: 1250-1310 OT Time Calculation (min): 20 min Charges:  OT General Charges $OT Visit: 1 Procedure OT Evaluation $OT Eval Moderate Complexity: 1 Procedure G-Codes:     Norman Herrlich, MS OTR/L  Pager: [place] A [facility] [place] 02/14/2017, 3:22 PM  Carmelle Bamberg A Nezar Buckles 02/14/2017, 3:22 PM

## 2017-02-14 NOTE — Progress Notes (Signed)
Patient ID: [name], female   DOB: 18-May-1941, 76 y.o.   MRN: [id number] Postoperative due to. Patient complains of bandages on her hair. Mild headache. She is awake and alert. She is oriented and appropriate. She has no complaints of numbness weakness or paresthesias. She standing and walking better.  Afebrile. Her vitals are stable. Drain output remains low. Awake and alert. Oriented and appropriate. Motor 5/5 bilaterally. No pronator drift.  Overall progressing recently well. Remove subdural drain today and sent to floor. Mobilize with therapy.

## 2017-02-14 NOTE — Evaluation (Signed)
Physical Therapy Evaluation Patient Details Name: [name] MRN: [id number] DOB: 1941-06-02 Today's Date: 02/14/2017   History of Present Illness  NEEKA URISTA is a 76 y.o. female with a past medical history significant for schizophrenia well-compensated, OSA not on CPAP, HTN, hx of TIA and gout who presents with worsening subdurals  Clinical Impression  Patient presents with decreased independence and safety with mobility, currently needs mod A for safety with ambulation to bathroom.  She doesn't show focal weakness, but generally weak from hospitalization.  She will benefit from skilled PT in the acute setting and feel she will progress to return home with daughter assist and follow up HHPT.  Has an aide 3 days a week as well.      Follow Up Recommendations Home health PT;Supervision/Assistance - 24 hour    Equipment Recommendations  3in1 (PT)    Recommendations for Other Services       Precautions / Restrictions Precautions Precautions: Fall      Mobility  Bed Mobility Overal bed mobility: Needs Assistance Bed Mobility: Rolling;Sidelying to Sit Rolling: Mod assist Sidelying to sit: Mod assist       General bed mobility comments: cues for technique, assist for turning on side and bringing legs off bed and lifting trunk  Transfers Overall transfer level: Needs assistance Equipment used: Rolling walker (2 wheeled) Transfers: Sit to/from Stand Sit to Stand: Mod assist         General transfer comment: lifting assist from EOB, pt reported feeling dizzy initially  Ambulation/Gait Ambulation/Gait assistance: Mod assist Ambulation Distance (Feet): 15 Feet (x 2) Assistive device: Rolling walker (2 wheeled) Gait Pattern/deviations: Step-to pattern;Decreased stride length;Shuffle;Drifts right/left;Trunk flexed     General Gait Details: assist for balance, slow shuffling steps and assist to progress walker for improved fluency  Stairs             Wheelchair Mobility    Modified Rankin (Stroke Patients Only)       Balance Overall balance assessment: Needs assistance   Sitting balance-Leahy Scale: Fair     Standing balance support: Bilateral upper extremity supported Standing balance-Leahy Scale: Poor Standing balance comment: UE support needed for balance                             Pertinent Vitals/Pain Pain Assessment: Faces Faces Pain Scale: Hurts even more Pain Location: head Pain Descriptors / Indicators: Aching Pain Intervention(s): Limited activity within patient's tolerance;Repositioned;Monitored during session;Premedicated before session    Home Living Family/patient expects to be discharged to:: Private residence Living Arrangements: Alone Available Help at Discharge: Family;Available 24 hours/day Type of Home: House Home Access: Ramped entrance (4 steps with rail in back)     Home Layout: One level Home Equipment: [place] - 4 wheels;Cane - single point Additional Comments: has an aide 3 days a week    Prior Function Level of Independence: Independent with assistive device(s)         Comments: prior to recent fall was using cane or rollator for ambulation independently; was doing some home managment with daughter sometimes assisting     Hand Dominance   Dominant Hand: Right    Extremity/Trunk Assessment   Upper Extremity Assessment Upper Extremity Assessment: Overall WFL for tasks assessed (mild numbness in hands)    Lower Extremity Assessment Lower Extremity Assessment: Generalized weakness       Communication   Communication: HOH  Cognition Arousal/Alertness: Awake/alert Behavior During Therapy: [facility]  for tasks assessed/performed Overall Cognitive Status: History of cognitive impairments - at baseline                                        General Comments General comments (skin integrity, edema, etc.): MD in to remove dressings on pt's head and  removed drain on L side.  Noted drops of blood in bathroom, RN in to re-dress area; daughter present throughout and reports able to support at d/c.,    Exercises     Assessment/Plan    PT Assessment Patient needs continued PT services  PT Problem List Decreased activity tolerance;Decreased balance;Decreased knowledge of use of DME;Decreased strength;Decreased mobility;Decreased safety awareness;Decreased knowledge of precautions       PT Treatment Interventions DME instruction;Gait training;Therapeutic exercise;Patient/family education;Therapeutic activities;Stair training;Balance training;Functional mobility training    PT Goals (Current goals can be found in the Care Plan section)  Acute Rehab PT Goals Patient Stated Goal: To return to independent PT Goal Formulation: With patient/family Time For Goal Achievement: 02/28/17 Potential to Achieve Goals: Good    Frequency Min 4X/week   Barriers to discharge        Co-evaluation               End of Session Equipment Utilized During Treatment: Gait belt Activity Tolerance: Patient tolerated treatment well Patient left: in chair;with call bell/phone within reach;with chair alarm set Nurse Communication: Mobility status PT Visit Diagnosis: Other abnormalities of gait and mobility (R26.89);History of falling (Z91.81)    Time: 7703-4035 PT Time Calculation (min) (ACUTE ONLY): 48 min   Charges:   PT Evaluation $PT Eval Moderate Complexity: 1 Procedure PT Treatments $Gait Training: 8-22 mins $Therapeutic Activity: 8-22 mins   PT G CodesMagda Kiel, [place] [phone] 02/14/2017   Reginia Naas 02/14/2017, 11:39 AM

## 2017-02-14 NOTE — Progress Notes (Signed)
PROGRESS NOTE    [name]  YIF:[id number] DOB: 03/23/1941 DOA: 02/09/2017 PCP: Larey Dresser, MD   Brief Narrative: 76 y.o. female with a past medical history significant for schizophrenia well-compensated, OSA not on CPAP, HTN, hx of TIA and gout, recently had fall with ER visit on CT scan of head was unremarkable. Patient then had MRI of the brain ordered by neurologist for another reason when she was found to have incidental bilateral small subdural hematoma. She was initially asymptomatic so it was decided to follow-up outpatient. But next 2- 3 days patient was complaining of new lower extremities weakness, intermittent headache and therefore her neurologist ordered a follow-up CT scan the day prior to admission when she was found to have enlargement of bilateral subdural hematomas with no midline shift. At that time she was sent to the ER for further evaluation including neurosurgery consultation.  Assessment & Plan:  # Bilateral subdural hematoma associated with headache and lower extremities weakness: -s/p bilateral frontal burr hole evacuation of subdural hematoma by Dr.Pool -Placement of the left side frontal subdural drain. Output low, possible drain removal per NSG -Mild pain at the site of drain otherwise clinically stable. -AMbulate, PT/OT  #Hypertension: Continue home medication.  -continue Norvasc, losartan, will hold lasix  #History of schizophrenia: Continue home medication  #Chronic kidney disease is stage III: Serum creatinine level stable, around baseline. Monitor BMP. Avoid nephrotoxins. -hold lasix  # constipation: Abdomen x-ray reviewed consistent with stool burden. - Started MiraLAX and Dulcolax, add senokot  # Normocytic anemia: Drop in hemoglobin today likely due to acute blood loss during surgery. Continue to monitor.  DVT prophylaxis: SCD. No anticoagulation because of subdural hematoma Code Status: Full code Family Communication: Discussed  with the patient's daughter at bedside at length. Disposition Plan: Tx to floor/tele    Consultants:   Neurosurgery  Procedures: None Antimicrobials: None  Subjective: c/p headache, pain at site of drain Ate breakfast  Objective: Vitals:   02/14/17 0800 02/14/17 0900 02/14/17 1024 02/14/17 1200  BP: 106/77 (!) 119/57 (!) 119/57 (!) 123/95  Pulse: 62 62  63  Resp: (!) 21 19  16   Temp:      TempSrc:      SpO2: 94% 93%  99%  Weight:      Height:        Intake/Output Summary (Last 24 hours) at 02/14/17 1309 Last data filed at 02/14/17 1100  Gross per 24 hour  Intake             1860 ml  Output              505 ml  Net             1355 ml   Filed Weights   02/09/17 1526 02/09/17 2353  Weight: 88 kg (194 lb) 86.5 kg (190 lb 12.8 oz)    Examination:  Gen: AAOx3, no distress, dressing on head with drain on left Lungs: CTAB CVS: : Regular rate rhythm, S1 and S2 normal. No pedal edema. ABd: soft, nontender, nondistended. Bowel sound positive Central nervous system: Alert, awake and following commands. No focal neurological deficit Extremities: Muscle strength 5 over 5 in all extremities Skin: No rashes, lesions or ulcers Psychiatry: Judgement and insight appear normal. Mood & affect appropriate.     Data Reviewed: I have personally reviewed following labs and imaging studies  CBC:  Recent Labs Lab 02/09/17 1545 02/09/17 1559 02/13/17 0222 02/14/17 0420  WBC 7.0  --  8.8  7.7  NEUTROABS 4.1  --   --   --   HGB 10.9* 11.9* 9.7* 8.7*  HCT 33.9* 35.0* 29.6* 27.2*  MCV 95.5  --  94.0 95.8  PLT 226  --  198 034   Basic Metabolic Panel:  Recent Labs Lab 02/09/17 1545 02/09/17 1559  NA 141 140  K 4.0 4.0  CL 104 107  CO2 23  --   GLUCOSE 100* 97  BUN 19 21*  CREATININE 1.33* 1.40*  CALCIUM 10.2  --    GFR: Estimated Creatinine Clearance: 36.9 mL/min (A) (by C-G formula based on SCr of 1.4 mg/dL (H)). Liver Function Tests:  Recent Labs Lab  02/09/17 1545  AST 26  ALT 22  ALKPHOS 116  BILITOT 0.5  PROT 7.1  ALBUMIN 4.1   No results for input(s): LIPASE, AMYLASE in the last 168 hours. No results for input(s): AMMONIA in the last 168 hours. Coagulation Profile:  Recent Labs Lab 02/09/17 1545  INR 1.08   Cardiac Enzymes: No results for input(s): CKTOTAL, CKMB, CKMBINDEX, TROPONINI in the last 168 hours. BNP (last 3 results) No results for input(s): PROBNP in the last 8760 hours. HbA1C: No results for input(s): HGBA1C in the last 72 hours. CBG:  Recent Labs Lab 02/09/17 1732  GLUCAP 94   Lipid Profile: No results for input(s): CHOL, HDL, LDLCALC, TRIG, CHOLHDL, LDLDIRECT in the last 72 hours. Thyroid Function Tests: No results for input(s): TSH, T4TOTAL, FREET4, T3FREE, THYROIDAB in the last 72 hours. Anemia Panel: No results for input(s): VITAMINB12, FOLATE, FERRITIN, TIBC, IRON, RETICCTPCT in the last 72 hours. Sepsis Labs: No results for input(s): PROCALCITON, LATICACIDVEN in the last 168 hours.  Recent Results (from the past 240 hour(s))  MRSA PCR Screening     Status: None   Collection Time: 02/10/17  1:38 AM  Result Value Ref Range Status   MRSA by PCR NEGATIVE NEGATIVE Final    Comment:        The GeneXpert MRSA Assay (FDA approved for NASAL specimens only), is one component of a comprehensive MRSA colonization surveillance program. It is not intended to diagnose MRSA infection nor to guide or monitor treatment for MRSA infections.          Radiology Studies: Ct Head Wo Contrast  Result Date: 02/13/2017 CLINICAL DATA:  Follow-up exam for subdural hemorrhage. EXAM: CT HEAD WITHOUT CONTRAST TECHNIQUE: Contiguous axial images were obtained from the base of the skull through the vertex without intravenous contrast. COMPARISON:  Prior CT from 02/09/2017. FINDINGS: Brain: Postoperative changes from interval bilateral subdural evacuation seen. Burr holes are now in place at the frontal  calvarium bilaterally. Subdural drain in place at the left frontal burr hole. Postoperative pneumocephalus overlies the bilateral frontal convexities, right greater than left. The mixed density right holo hemispheric subdural collection is overall increased in size measuring up to 18 mm and maximal thickness at the right frontal convexity. This largely reflects gas. The blood component is overall similar in size measuring approximately 11 mm in maximal thickness. No new hemorrhage seen within this collection. Mixed density left holo hemispheric collection is overall slightly increased in size measuring up to 13 mm in maximal thickness at the left parietal convexity. There is acute blood within this collection at the anterior left frontal convexity. Subdural drain overlies the left frontal convexity. Slightly increased right-to-left shift now measuring up to 7 mm. No hydrocephalus. Basilar cisterns remain patent. No other new acute intracranial hemorrhage. No evidence for acute large  vessel territory infarct. Vascular: No hyperdense vessel. Skull: Postoperative swelling with emphysema present within the bilateral scalp. Skin staples in place. Bifrontal burr holes in place. Sinuses/Orbits: Globes and orbital soft tissues within normal limits. Patient is status post lens extraction on the left. Paranasal sinuses and mastoid air cells are clear. Other: None. IMPRESSION: 1. Postoperative changes from interval frontal burr hole evacuation of bilateral subdural hematomas. 2. Slight interval increase in size of mixed density right subdural collection, largely due to postoperative pneumocephalus. Associated right-to-left midline shift now measures. 3. Slight interval increase in size of mixed density left subdural collection, with small amount of acute blood present anteriorly. Subdural drain in place. 4. No other new acute intracranial process. Electronically Signed   By: Jeannine Boga M.D.   On: 02/13/2017 07:13         Scheduled Meds: . allopurinol  300 mg Oral Daily  . amLODipine  10 mg Oral Daily  . atorvastatin  40 mg Oral Daily  . buPROPion  300 mg Oral q morning - 10a  . hydrOXYzine  25 mg Oral QHS  . losartan  100 mg Oral Daily  . oxybutynin  5 mg Oral QHS  . oxyCODONE  10 mg Oral NOW  . pantoprazole  40 mg Oral BID AC  . polyethylene glycol  17 g Oral Daily  . potassium chloride  10 mEq Oral Daily   Continuous Infusions: . sodium chloride 10 mL/hr at 02/13/17 0700  . levETIRAcetam       LOS: 4 days    Domenic Polite, MD [facility] Pager [phone]  If 7PM-7AM, please contact night-coverage [email or web address] Password [email or web address] 02/14/2017, 1:09 PM

## 2017-02-14 NOTE — Progress Notes (Signed)
Dressing to patient's head changed due to serosanguinous drainage noted.  Education provided to patient's family regarding signs and symptoms of infection, the possible causes of the drainage. Verbalizes understanding.  Will continue to assess dressing as per protocol and reinforce teaching for patient and family peace of mind.  [name]

## 2017-02-15 LAB — BASIC METABOLIC PANEL
Anion gap: 8 (ref 5–15)
BUN: 14 mg/dL (ref 6–20)
CO2: 23 mmol/L (ref 22–32)
Calcium: 8.8 mg/dL — ABNORMAL LOW (ref 8.9–10.3)
Chloride: 107 mmol/L (ref 101–111)
Creatinine, Ser: 1.17 mg/dL — ABNORMAL HIGH (ref 0.44–1.00)
GFR calc Af Amer: 51 mL/min — ABNORMAL LOW (ref >60)
GFR calc non Af Amer: 44 mL/min — ABNORMAL LOW (ref >60)
Glucose, Bld: 114 mg/dL — ABNORMAL HIGH (ref 65–99)
Potassium: 4 mmol/L (ref 3.5–5.1)
Sodium: 138 mmol/L (ref 135–145)

## 2017-02-15 LAB — CBC
HCT: 27 % — ABNORMAL LOW (ref 36.0–46.0)
Hemoglobin: 9 g/dL — ABNORMAL LOW (ref 12.0–15.0)
MCH: 31.8 pg (ref 26.0–34.0)
MCHC: 33.3 g/dL (ref 30.0–36.0)
MCV: 95.4 fL (ref 78.0–100.0)
Platelets: 168 10*3/uL (ref 150–400)
RBC: 2.83 MIL/uL — ABNORMAL LOW (ref 3.87–5.11)
RDW: 15.9 % — ABNORMAL HIGH (ref 11.5–15.5)
WBC: 6.9 10*3/uL (ref 4.0–10.5)

## 2017-02-15 MED ORDER — FLEET ENEMA 7-19 GM/118ML RE ENEM
1.0000 | ENEMA | Freq: Once | RECTAL | Status: AC
  Administered 2017-02-15: 1 via RECTAL
  Filled 2017-02-15: qty 1

## 2017-02-15 MED ORDER — HYDROCODONE-ACETAMINOPHEN 5-325 MG PO TABS: 1.0000 | ORAL_TABLET | Freq: Four times a day (QID) | ORAL | 0 refills | Status: DC | PRN

## 2017-02-15 NOTE — Progress Notes (Signed)
Patient left burr hole site from previous drain with serosanguinous drainage requiring changing around 0145, shadowed by 0330.  Continue to monitor. Educate patient.

## 2017-02-15 NOTE — Consult Note (Addendum)
           [facility] CM Primary Care Navigator  02/15/2017  [name] 1941-08-29 [id number]   Wentto see patient at the bedside to identify possible discharge needs but she was just discharged. RN reports that patient was discharged home with home health services today.  Primary care provider's office called ([facility]/ [facility])to notify of patient's discharge and need for post hospital follow-up and transition of care. Notified also of patient's health issues needing follow-up.  Made aware to refer patient to [facility] care management ifdeemed appropriatefor services.  For questions, please contact:  Dannielle Huh, BSN, RN- [facility] Primary Care Navigator  Telephone: [phone] [place]

## 2017-02-15 NOTE — Discharge Instructions (Signed)

## 2017-02-15 NOTE — Discharge Summary (Signed)
Physician Discharge Summary  Patient ID: [name] MRN: [id number] DOB/AGE: 01-19-1941 76 y.o.  Admit date: 02/09/2017 Discharge date: 02/15/2017  Admission Diagnoses:  Discharge Diagnoses:  Principal Problem:   Subdural hematoma without coma ([place]) Active Problems:   Essential hypertension   Chronic diastolic heart failure (HCC)   Chronic prescription opiate use   Schizophrenia, chronic condition (HCC)   Chronic kidney disease (CKD), stage III (moderate)   SDH (subdural hematoma) ([place])   Discharged Condition: good  Hospital Course: Patient mid-hospital for treatment and evaluation of bilateral subdural hematomas. The patient was stabilized medically. She subsequently underwent bilateral bur hole evacuation of subdural hematoma. Postoperative she is done well. Headaches and lower extremity weakness and unsteadiness are much improved. She is ambulating without difficulty. She is ready for discharge home.  Consults:   Significant Diagnostic Studies:   Treatments:   Discharge Exam: Blood pressure (!) 123/57, pulse 65, temperature 100.2 F (37.9 C), temperature source Oral, resp. rate 16, height 5\' 4"  (1.626 m), weight 86.5 kg (190 lb 12.8 oz), SpO2 95 %.    Awake and alert, oriented and appropriate. Speech fluent. Cranial nerve function intact. Motor and sensory function intact. Wounds clean and dry. Chest and abdomen benign. Disposition: 01-Home or Self Care    Follow-up Information    Sandy Blouch A, MD. Schedule an appointment as soon as possible for a visit in 1 week(s).   Specialty:  Neurosurgery Contact information: [address] [place] [address] [phone]           Signed: Charlie Pitter 02/15/2017, 10:53 AM

## 2017-02-15 NOTE — Care Management Important Message (Signed)
Important Message  Patient Details  Name: [name] MRN: [id number] Date of Birth: Mar 24, 1941   Medicare Important Message Given:  Yes    [name] 02/15/2017, 1:12 PM

## 2017-02-15 NOTE — Progress Notes (Signed)
Discharge orders received.  Discharge instructions and follow-up appointments reviewed with the patient and her daughter.  All belongings sent with the patient.  Prescriptions given to the daughter.  VSS upon discharge.  IV removed and education complete.  Transported out via wheelchair.  Cori Razor, RN

## 2017-02-15 NOTE — Progress Notes (Signed)
Occupational Therapy Treatment Patient Details Name: [name] MRN: [id number] DOB: [birth year] Today's Date: 02/15/2017    History of present illness [name] is a 76 y.o. female with a past medical history significant for schizophrenia well-compensated, OSA not on CPAP, HTN, hx of TIA and gout who presents with worsening B subdural hematoma. Pt s/p B burr holes.   OT comments  PT making progress towards OT goals. Pt very pleasant and agreeable to work with therapy this session, able to perform toilet transfer and peri care as well as sink level grooming with vc for sequencing and safety with RW. Pt continues to benefit from skilled OT in the acute setting and will require [facility] (at her daughters house) to return to PLOF safely.  Follow Up Recommendations  Home health OT;Supervision/Assistance - 24 hour    Equipment Recommendations  3 in 1 bedside commode    Recommendations for Other Services      Precautions / Restrictions Precautions Precautions: Fall Restrictions Weight Bearing Restrictions: No       Mobility Bed Mobility Overal bed mobility: Needs Assistance Bed Mobility: Rolling;Sidelying to Sit Rolling: Mod assist Sidelying to sit: Mod assist       General bed mobility comments: assist for trunk elevation and max cues for Pt to perform actions on her own  Transfers Overall transfer level: Needs assistance Equipment used: Rolling walker (2 wheeled);4-wheeled walker Transfers: Sit to/from Stand Sit to Stand: Min assist         General transfer comment: min A for boost and initial balance    Balance Overall balance assessment: Needs assistance Sitting-balance support: No upper extremity supported;Feet supported Sitting balance-Leahy Scale: Fair Sitting balance - Comments: sitting EOB, able to put lotion on her legs   Standing balance support: Bilateral upper extremity supported Standing balance-Leahy Scale: Poor Standing balance comment: UE  support needed for balance                           ADL either performed or assessed with clinical judgement   ADL Overall ADL's : Needs assistance/impaired     Grooming: Wash/dry hands;Min guard;Standing;Cueing for sequencing Grooming Details (indicate cue type and reason): sink level, vc for safety with RW, sequencing             Lower Body Dressing: Min guard;Sitting/lateral leans Lower Body Dressing Details (indicate cue type and reason): able to adjust socks sitting EOB Toilet Transfer: Minimal assistance;Ambulation;Grab bars;RW;BSC (BSC over toilet in bathroom)   Toileting- Clothing Manipulation and Hygiene: Sit to/from stand;Min guard Toileting - Clothing Manipulation Details (indicate cue type and reason): Pt able to perform front peri care     Functional mobility during ADLs: Min guard;Cueing for safety;Rolling walker       Vision       Perception     Praxis      Cognition Arousal/Alertness: Awake/alert Behavior During Therapy: WFL for tasks assessed/performed Overall Cognitive Status: History of cognitive impairments - at baseline                           Safety/Judgement: Decreased awareness of safety     General Comments: Pt very pleasant and cooperative during session. No agitation, Pt's daughter reports this is her baseline.        Exercises     Shoulder Instructions       General Comments Pt's daughter present for session  Pertinent Vitals/ Pain       Pain Assessment: Faces Faces Pain Scale: Hurts little more Pain Location: head Pain Descriptors / Indicators: Aching Pain Intervention(s): Monitored during session;Repositioned  Home Living                                          Prior Functioning/Environment              Frequency  Min 2X/week        Progress Toward Goals  OT Goals(current goals can now be found in the care plan section)  Progress towards OT goals: Progressing  toward goals  Acute Rehab OT Goals Patient Stated Goal: To return to independent OT Goal Formulation: With family Time For Goal Achievement: 02/28/17 Potential to Achieve Goals: Good  Plan Discharge plan remains appropriate    Co-evaluation    PT/OT/SLP Co-Evaluation/Treatment: Yes Reason for Co-Treatment: For patient/therapist safety;To address functional/ADL transfers;Other (comment) (For patient tolerance of therapy)   OT goals addressed during session: ADL's and self-care      End of Session Equipment Utilized During Treatment: Gait belt;Rolling walker  OT Visit Diagnosis: Muscle weakness (generalized) (M62.81);Other symptoms and signs involving cognitive function   Activity Tolerance Patient tolerated treatment well   Patient Left in chair;with call bell/phone within reach;with chair alarm set;with family/visitor present   Nurse Communication Mobility status        Time: 6701-4103 OT Time Calculation (min): 38 min  Charges: OT General Charges $OT Visit: 1 Procedure OT Treatments $Self Care/Home Management : 8-22 mins  Hulda Humphrey OTR/L [place] 02/15/2017, 1:30 PM

## 2017-02-15 NOTE — Care Management Note (Signed)
Case Management Note  Patient Details  Name: [name] MRN: [id number] Date of Birth: 10/18/1941  Subjective/Objective:                    Action/Plan: Pt discharging home with [facility] services. CM met with the patient and her daughter and provided a list of [facility] agencies. She selected [place]. Santiago Glad with [facility] notified and accepted the referral.  Pt with orders for 3 in 1. Santiago Glad with [facility] had the equipment delivered to the room. Daughter to provide transportation home.    Expected Discharge Date:  02/15/17               Expected Discharge Plan:  [place]  In-House Referral:     Discharge planning Services  CM Consult  Post Acute Care Choice:  Home Health, Durable Medical Equipment Choice offered to:  Patient, Adult Children  DME Arranged:  3-N-1 DME Agency:  [place]:  RN, PT, OT [facility] Agency:  [place]  Status of Service:  Completed, signed off  If discussed at [place] of Stay Meetings, dates discussed:    Additional Comments:  Pollie Friar, RN 02/15/2017, 2:55 PM

## 2017-02-15 NOTE — Progress Notes (Signed)
Physical Therapy Treatment Patient Details Name: [name] MRN: [id number] DOB: [birth year] Today's Date: 02/15/2017    History of Present Illness [name] is a 76 y.o. female with a past medical history significant for schizophrenia well-compensated, OSA not on CPAP, HTN, hx of TIA and gout who presents with worsening B subdural hematoma. Pt s/p B burr holes.    PT Comments    Pt progressing well, needing less assist for general mobility and gait stability.  Pt very participative throughout.    Follow Up Recommendations  Home health PT;Supervision/Assistance - 24 hour     Equipment Recommendations  3in1 (PT)    Recommendations for Other Services       Precautions / Restrictions Precautions Precautions: Fall Restrictions Weight Bearing Restrictions: No    Mobility  Bed Mobility Overal bed mobility: Needs Assistance Bed Mobility: Rolling;Sidelying to Sit Rolling: Mod assist Sidelying to sit: Mod assist       General bed mobility comments: assist for trunk elevation and max cues for Pt to perform actions on her own  Transfers Overall transfer level: Needs assistance Equipment used: Rolling walker (2 wheeled);4-wheeled walker Transfers: Sit to/from Stand Sit to Stand: Min assist         General transfer comment: min A for boost and initial balance  Ambulation/Gait Ambulation/Gait assistance: Min assist Ambulation Distance (Feet): 60 Feet Assistive device: Rolling walker (2 wheeled) Gait Pattern/deviations: Step-through pattern;Decreased step length - right;Decreased step length - left;Decreased stride length   Gait velocity interpretation: Below normal speed for age/gender General Gait Details: some stability assist needed, slow, short, low amplitude steps with occasional shuffle.   Stairs            Wheelchair Mobility    Modified Rankin (Stroke Patients Only)       Balance Overall balance assessment: Needs  assistance Sitting-balance support: No upper extremity supported;Feet supported Sitting balance-Leahy Scale: Fair Sitting balance - Comments: sitting EOB, able to put lotion on her legs   Standing balance support: Bilateral upper extremity supported Standing balance-Leahy Scale: Poor Standing balance comment: UE support needed for balance                            Cognition Arousal/Alertness: Awake/alert Behavior During Therapy: WFL for tasks assessed/performed Overall Cognitive Status: History of cognitive impairments - at baseline                           Safety/Judgement: Decreased awareness of safety     General Comments: Pt very pleasant and cooperative during session. No agitation, Pt's daughter reports this is her baseline.      Exercises      General Comments General comments (skin integrity, edema, etc.): Pt's daughter present for session      Pertinent Vitals/Pain Pain Assessment: Faces Faces Pain Scale: Hurts little more Pain Location: head Pain Descriptors / Indicators: Aching Pain Intervention(s): Monitored during session    Home Living                      Prior Function            PT Goals (current goals can now be found in the care plan section) Acute Rehab PT Goals Patient Stated Goal: To return to independent PT Goal Formulation: With patient/family Time For Goal Achievement: 02/28/17 Potential to Achieve Goals: Good Progress towards PT goals: Progressing toward goals  Frequency    Min 4X/week      PT Plan Current plan remains appropriate    Co-evaluation PT/OT/SLP Co-Evaluation/Treatment: Yes Reason for Co-Treatment: For patient/therapist safety PT goals addressed during session: Mobility/safety with mobility OT goals addressed during session: ADL's and self-care     End of Session   Activity Tolerance: Patient tolerated treatment well Patient left: in chair;with call bell/phone within  reach;with chair alarm set;with family/visitor present Nurse Communication: Mobility status PT Visit Diagnosis: Other abnormalities of gait and mobility (R26.89);History of falling (Z91.81)     Time: 7505-1833 PT Time Calculation (min) (ACUTE ONLY): 38 min  Charges:  $Gait Training: 8-22 mins $Therapeutic Activity: 8-22 mins                    G Codes:       02-28-2017  Donnella Sham, PT [phone]  (pager)   Tessie Fass Sebastian Lurz 2017/02/28, 2:15 PM

## 2017-02-16 ENCOUNTER — Telehealth: Payer: Self-pay

## 2017-02-16 NOTE — Telephone Encounter (Signed)
Edwena Felty from [facility] needs to speak with a nurse about pt.  Please call back.

## 2017-02-16 NOTE — Telephone Encounter (Signed)
[name], [name], [facility] called to let us know pt needs a HFU in 10 to 14 days

## 2017-02-19 DIAGNOSIS — Z8541 Personal history of malignant neoplasm of cervix uteri: Secondary | ICD-10-CM | POA: Diagnosis not present

## 2017-02-19 DIAGNOSIS — I5032 Chronic diastolic (congestive) heart failure: Secondary | ICD-10-CM | POA: Diagnosis not present

## 2017-02-19 DIAGNOSIS — I130 Hypertensive heart and chronic kidney disease with heart failure and stage 1 through stage 4 chronic kidney disease, or unspecified chronic kidney disease: Secondary | ICD-10-CM | POA: Diagnosis not present

## 2017-02-19 DIAGNOSIS — M109 Gout, unspecified: Secondary | ICD-10-CM | POA: Diagnosis not present

## 2017-02-19 DIAGNOSIS — Z79891 Long term (current) use of opiate analgesic: Secondary | ICD-10-CM | POA: Diagnosis not present

## 2017-02-19 DIAGNOSIS — Z48812 Encounter for surgical aftercare following surgery on the circulatory system: Secondary | ICD-10-CM | POA: Diagnosis not present

## 2017-02-19 DIAGNOSIS — G4733 Obstructive sleep apnea (adult) (pediatric): Secondary | ICD-10-CM | POA: Diagnosis not present

## 2017-02-19 DIAGNOSIS — Z7951 Long term (current) use of inhaled steroids: Secondary | ICD-10-CM | POA: Diagnosis not present

## 2017-02-19 DIAGNOSIS — N183 Chronic kidney disease, stage 3 (moderate): Secondary | ICD-10-CM | POA: Diagnosis not present

## 2017-02-19 DIAGNOSIS — F2089 Other schizophrenia: Secondary | ICD-10-CM | POA: Diagnosis not present

## 2017-02-19 DIAGNOSIS — Z8673 Personal history of transient ischemic attack (TIA), and cerebral infarction without residual deficits: Secondary | ICD-10-CM | POA: Diagnosis not present

## 2017-02-26 ENCOUNTER — Other Ambulatory Visit: Payer: Self-pay | Admitting: Neurosurgery

## 2017-02-26 DIAGNOSIS — I6203 Nontraumatic chronic subdural hemorrhage: Secondary | ICD-10-CM

## 2017-02-27 ENCOUNTER — Other Ambulatory Visit: Payer: Self-pay

## 2017-02-27 ENCOUNTER — Ambulatory Visit (INDEPENDENT_AMBULATORY_CARE_PROVIDER_SITE_OTHER): Payer: PPO | Admitting: Psychiatry

## 2017-02-27 ENCOUNTER — Encounter (HOSPITAL_COMMUNITY): Payer: Self-pay | Admitting: Psychiatry

## 2017-02-27 DIAGNOSIS — Z8541 Personal history of malignant neoplasm of cervix uteri: Secondary | ICD-10-CM | POA: Diagnosis not present

## 2017-02-27 DIAGNOSIS — I130 Hypertensive heart and chronic kidney disease with heart failure and stage 1 through stage 4 chronic kidney disease, or unspecified chronic kidney disease: Secondary | ICD-10-CM | POA: Diagnosis not present

## 2017-02-27 DIAGNOSIS — F200 Paranoid schizophrenia: Secondary | ICD-10-CM | POA: Diagnosis not present

## 2017-02-27 DIAGNOSIS — Z818 Family history of other mental and behavioral disorders: Secondary | ICD-10-CM

## 2017-02-27 DIAGNOSIS — F419 Anxiety disorder, unspecified: Secondary | ICD-10-CM | POA: Diagnosis not present

## 2017-02-27 DIAGNOSIS — Z48812 Encounter for surgical aftercare following surgery on the circulatory system: Secondary | ICD-10-CM | POA: Diagnosis not present

## 2017-02-27 DIAGNOSIS — I5032 Chronic diastolic (congestive) heart failure: Secondary | ICD-10-CM | POA: Diagnosis not present

## 2017-02-27 DIAGNOSIS — Z87891 Personal history of nicotine dependence: Secondary | ICD-10-CM | POA: Diagnosis not present

## 2017-02-27 DIAGNOSIS — M109 Gout, unspecified: Secondary | ICD-10-CM | POA: Diagnosis not present

## 2017-02-27 DIAGNOSIS — F2089 Other schizophrenia: Secondary | ICD-10-CM | POA: Diagnosis not present

## 2017-02-27 DIAGNOSIS — Z8673 Personal history of transient ischemic attack (TIA), and cerebral infarction without residual deficits: Secondary | ICD-10-CM | POA: Diagnosis not present

## 2017-02-27 DIAGNOSIS — Z7951 Long term (current) use of inhaled steroids: Secondary | ICD-10-CM | POA: Diagnosis not present

## 2017-02-27 DIAGNOSIS — Z79891 Long term (current) use of opiate analgesic: Secondary | ICD-10-CM | POA: Diagnosis not present

## 2017-02-27 DIAGNOSIS — G4733 Obstructive sleep apnea (adult) (pediatric): Secondary | ICD-10-CM | POA: Diagnosis not present

## 2017-02-27 DIAGNOSIS — N183 Chronic kidney disease, stage 3 (moderate): Secondary | ICD-10-CM | POA: Diagnosis not present

## 2017-02-27 MED ORDER — HYDROXYZINE PAMOATE 25 MG PO CAPS: 25.0000 mg | ORAL_CAPSULE | Freq: Two times a day (BID) | ORAL | 2 refills | Status: DC

## 2017-02-27 MED ORDER — BUPROPION HCL ER (XL) 300 MG PO TB24: 300.0000 mg | ORAL_TABLET | Freq: Every morning | ORAL | 2 refills | Status: DC

## 2017-02-27 NOTE — Telephone Encounter (Signed)
Pt daughter needs to speak with a nurse about meds. Please call back.

## 2017-02-27 NOTE — Telephone Encounter (Signed)
Pt daughter called back asking all meds to be filled @ [facility] on [address]. Daughter states [facility] is no longer in service and unable to get meds. Also needs to speak with a nurse about home health aide.
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
## 2017-02-28 ENCOUNTER — Other Ambulatory Visit: Payer: Self-pay | Admitting: Internal Medicine

## 2017-02-28 MED ORDER — OXYBUTYNIN CHLORIDE ER 5 MG PO TB24: 5.0000 mg | ORAL_TABLET | Freq: Every day | ORAL | 3 refills | Status: DC

## 2017-02-28 MED ORDER — ATORVASTATIN CALCIUM 40 MG PO TABS: 40.0000 mg | ORAL_TABLET | Freq: Every day | ORAL | 3 refills | Status: DC

## 2017-02-28 MED ORDER — PANTOPRAZOLE SODIUM 40 MG PO TBEC: DELAYED_RELEASE_TABLET | ORAL | 3 refills | Status: DC

## 2017-02-28 MED ORDER — ALLOPURINOL 300 MG PO TABS: 300.0000 mg | ORAL_TABLET | Freq: Every day | ORAL | 3 refills | Status: DC

## 2017-02-28 MED ORDER — FUROSEMIDE 20 MG PO TABS: ORAL_TABLET | ORAL | 11 refills | Status: DC

## 2017-02-28 MED ORDER — LOSARTAN POTASSIUM 100 MG PO TABS: 100.0000 mg | ORAL_TABLET | Freq: Every day | ORAL | 11 refills | Status: DC

## 2017-02-28 MED ORDER — POTASSIUM CHLORIDE ER 10 MEQ PO CPCR: 10.0000 meq | ORAL_CAPSULE | Freq: Every day | ORAL | 3 refills | Status: DC

## 2017-02-28 MED ORDER — FLUTICASONE PROPIONATE 50 MCG/ACT NA SUSP: NASAL | 4 refills | Status: DC

## 2017-02-28 MED ORDER — NYSTATIN 100000 UNIT/GM EX POWD: CUTANEOUS | 11 refills | Status: DC

## 2017-02-28 MED ORDER — AMLODIPINE BESYLATE 10 MG PO TABS: 10.0000 mg | ORAL_TABLET | Freq: Every day | ORAL | 3 refills | Status: DC

## 2017-02-28 NOTE — Telephone Encounter (Signed)
I do not Rx Vit C, calcium, Vit D - OTC I do not Rx wellbutrin, vistaril, senna, colace Has RF on all other meds

## 2017-02-28 NOTE — Telephone Encounter (Signed)
Hi Dr. Lynnae January, I called the pharmacy and they couldn't figure out why. It's possible the patient placed multiple requests and they kept coming in even after they were already approved. Sometimes they call in refills using an automated phone system that bypasses speaking to a person. I will try to keep an eye out or if you want to forward any to me in the future I can continue looking into them.

## 2017-03-01 ENCOUNTER — Emergency Department (HOSPITAL_COMMUNITY): Payer: PPO

## 2017-03-01 ENCOUNTER — Encounter (HOSPITAL_COMMUNITY): Payer: Self-pay | Admitting: Emergency Medicine

## 2017-03-01 ENCOUNTER — Emergency Department (HOSPITAL_COMMUNITY)
Admission: EM | Admit: 2017-03-01 | Discharge: 2017-03-01 | Disposition: A | Discharge status: Home / Self Care | Payer: PPO | Attending: Emergency Medicine | Admitting: Emergency Medicine

## 2017-03-01 DIAGNOSIS — I5032 Chronic diastolic (congestive) heart failure: Secondary | ICD-10-CM | POA: Diagnosis not present

## 2017-03-01 DIAGNOSIS — Z8673 Personal history of transient ischemic attack (TIA), and cerebral infarction without residual deficits: Secondary | ICD-10-CM | POA: Insufficient documentation

## 2017-03-01 DIAGNOSIS — W01198A Fall on same level from slipping, tripping and stumbling with subsequent striking against other object, initial encounter: Secondary | ICD-10-CM | POA: Insufficient documentation

## 2017-03-01 DIAGNOSIS — S0003XA Contusion of scalp, initial encounter: Secondary | ICD-10-CM | POA: Diagnosis not present

## 2017-03-01 DIAGNOSIS — I130 Hypertensive heart and chronic kidney disease with heart failure and stage 1 through stage 4 chronic kidney disease, or unspecified chronic kidney disease: Secondary | ICD-10-CM | POA: Diagnosis not present

## 2017-03-01 DIAGNOSIS — S0990XA Unspecified injury of head, initial encounter: Secondary | ICD-10-CM | POA: Diagnosis not present

## 2017-03-01 DIAGNOSIS — Z96643 Presence of artificial hip joint, bilateral: Secondary | ICD-10-CM | POA: Diagnosis not present

## 2017-03-01 DIAGNOSIS — Z79899 Other long term (current) drug therapy: Secondary | ICD-10-CM | POA: Insufficient documentation

## 2017-03-01 DIAGNOSIS — N183 Chronic kidney disease, stage 3 (moderate): Secondary | ICD-10-CM | POA: Diagnosis not present

## 2017-03-01 DIAGNOSIS — J45909 Unspecified asthma, uncomplicated: Secondary | ICD-10-CM | POA: Diagnosis not present

## 2017-03-01 DIAGNOSIS — Z87891 Personal history of nicotine dependence: Secondary | ICD-10-CM | POA: Diagnosis not present

## 2017-03-01 DIAGNOSIS — Y939 Activity, unspecified: Secondary | ICD-10-CM | POA: Insufficient documentation

## 2017-03-01 DIAGNOSIS — S065X0A Traumatic subdural hemorrhage without loss of consciousness, initial encounter: Secondary | ICD-10-CM | POA: Diagnosis not present

## 2017-03-01 DIAGNOSIS — Z8679 Personal history of other diseases of the circulatory system: Secondary | ICD-10-CM

## 2017-03-01 DIAGNOSIS — Y929 Unspecified place or not applicable: Secondary | ICD-10-CM | POA: Insufficient documentation

## 2017-03-01 DIAGNOSIS — Y999 Unspecified external cause status: Secondary | ICD-10-CM | POA: Insufficient documentation

## 2017-03-01 LAB — BASIC METABOLIC PANEL
Anion gap: 11 (ref 5–15)
BUN: 21 mg/dL — ABNORMAL HIGH (ref 6–20)
CO2: 21 mmol/L — ABNORMAL LOW (ref 22–32)
Calcium: 9.1 mg/dL (ref 8.9–10.3)
Chloride: 109 mmol/L (ref 101–111)
Creatinine, Ser: 1.44 mg/dL — ABNORMAL HIGH (ref 0.44–1.00)
GFR calc Af Amer: 40 mL/min — ABNORMAL LOW (ref >60)
GFR calc non Af Amer: 35 mL/min — ABNORMAL LOW (ref >60)
Glucose, Bld: 113 mg/dL — ABNORMAL HIGH (ref 65–99)
Potassium: 3.5 mmol/L (ref 3.5–5.1)
Sodium: 141 mmol/L (ref 135–145)

## 2017-03-01 LAB — CBC WITH DIFFERENTIAL/PLATELET
Basophils Absolute: 0 10*3/uL (ref 0.0–0.1)
Basophils Relative: 0 %
Eosinophils Absolute: 1.1 10*3/uL — ABNORMAL HIGH (ref 0.0–0.7)
Eosinophils Relative: 11 %
HCT: 32.3 % — ABNORMAL LOW (ref 36.0–46.0)
Hemoglobin: 10.4 g/dL — ABNORMAL LOW (ref 12.0–15.0)
Lymphocytes Relative: 29 %
Lymphs Abs: 2.9 10*3/uL (ref 0.7–4.0)
MCH: 30.2 pg (ref 26.0–34.0)
MCHC: 32.2 g/dL (ref 30.0–36.0)
MCV: 93.9 fL (ref 78.0–100.0)
Monocytes Absolute: 0.4 10*3/uL (ref 0.1–1.0)
Monocytes Relative: 4 %
Neutro Abs: 5.6 10*3/uL (ref 1.7–7.7)
Neutrophils Relative %: 56 %
Platelets: 230 10*3/uL (ref 150–400)
RBC: 3.44 MIL/uL — ABNORMAL LOW (ref 3.87–5.11)
RDW: 15.5 % (ref 11.5–15.5)
WBC: 10.1 10*3/uL (ref 4.0–10.5)

## 2017-03-01 MED ORDER — HYDROCODONE-ACETAMINOPHEN 5-325 MG PO TABS
2.0000 | ORAL_TABLET | Freq: Once | ORAL | Status: AC
  Administered 2017-03-01: 2 via ORAL
  Filled 2017-03-01: qty 2

## 2017-03-01 NOTE — ED Provider Notes (Signed)
[facility] DEPT Provider Note   CSN: [id number] Arrival date & time: 03/01/17  0217  By signing my name below, I, Margit Banda, attest that this documentation has been prepared under the direction and in the presence of Veryl Speak, MD. Electronically Signed: Margit Banda, ED Scribe. 03/01/17. 2:34 AM.   History   Chief Complaint Chief Complaint  Patient presents with  . Fall    HPI [name] is a 76 y.o. female with a PSHx of brain surgery who presents to the Emergency Department complaining of head pain s/p slipping and falling PTA. Pt reports she was getting into bed when she slipped and hit her head. Denies LOC. She had brain surgery ~ month ago. She cracked her skull and it filled with blood. Pt denies HA or visual changes.  The history is provided by the patient. No language interpreter was used.  Fall  This is a new problem. The current episode started 1 to 2 hours ago. The problem has not changed since onset.Pertinent negatives include no headaches. Nothing aggravates the symptoms. Nothing relieves the symptoms.    Past Medical History:  Diagnosis Date  . Chronic venous insufficiency   . Colon, diverticulosis Sept. 2011   outpatient colonoscopy by Dr. Cristina Gong.  Need record  . DDD (degenerative disc disease), lumbosacral   . Depression   . DJD (degenerative joint disease), multiple sites    Low back pain worst  . GERD (gastroesophageal reflux disease)   . Gout   . Gout    Uric Acid level 4.2 on 300 allopurinol  . History of cervical cancer 1972  . Hx-TIA (transient ischemic attack) 05/13/2001   Right facial numbness  . Hyperlipidemia   . Hypertension   . Memory disorder 12/20/2016  . Mitral valve prolapse 10/27/1999   Subsequent 2D echo show normal mitral valve  . Osteoarthritis of hip    bilateral hips  . Ovarian cancer ([place]) 1972   S/P oophorectomy  . Psychosis   . Recurrent boils    History of MRSA skin infections with abscess  .  Sensorineural hearing loss of both ears   . Subdural hematoma ([place]) 01/26/2017   bilateral    Patient Active Problem List   Diagnosis Date Noted  . SDH (subdural hematoma) ([place]) 02/10/2017  . Subdural hematoma without coma ([place]) 02/09/2017  . Acute low back pain 02/04/2017  . Memory disorder 12/20/2016  . History of lumbar laminectomy for spinal cord decompression 02/02/2016  . Atopic dermatitis 07/09/2015  . Bereavement 12/01/2014  . Psychosis 12/01/2014  . Reactive airway disease 08/11/2014  . History of cancer 08/11/2014  . OSA (obstructive sleep apnea) 08/11/2014  . Healthcare maintenance 07/17/2013  . Chronic kidney disease (CKD), stage III (moderate) 10/04/2012  . Schizophrenia, chronic condition ([place]) 01/20/2012  . Postmenopausal atrophic vaginitis 11/01/2010  . Chronic diastolic heart failure ([place]) 05/06/2010  . Allergic rhinitis 10/14/2009  . Obesity, Class II, BMI 35-39.9, with comorbidity 05/14/2009  . Chronic prescription opiate use 04/29/2008  . Normocytic anemia 09/25/2007  . Hyperlipidemia 09/03/2006  . Gout 09/03/2006  . Essential hypertension 09/03/2006  . GERD 09/03/2006  . Hx-TIA (transient ischemic attack) 05/13/2001    Past Surgical History:  Procedure Laterality Date  . ABDOMINAL HYSTERECTOMY     1972  . BURR HOLE Bilateral 02/12/2017   Procedure: Haskell Flirt;  Surgeon: Earnie Larsson, MD;  Location: [place] [facility] [place];  Service: Neurosurgery;  Laterality: Bilateral;  . CERVICAL DISCECTOMY  7/07   C5-C6  . JOINT REPLACEMENT  bilateral hip replacement  . LUMBAR DISC SURGERY     L5-S1 7/07  . LUMBAR LAMINECTOMY/DECOMPRESSION MICRODISCECTOMY N/A 02/02/2016   Procedure: L4-5 Decompression;  Surgeon: Marybelle Killings, MD;  Location: [place];  Service: Orthopedics;  Laterality: N/A;  . [place] for ovarian cancer    OB History    No data available       Home Medications    Prior to Admission medications   Medication Sig Start Date End Date Taking?  Authorizing Provider  allopurinol (ZYLOPRIM) 300 MG tablet Take 1 tablet (300 mg total) by mouth daily. 02/28/17   Bartholomew Crews, MD  amLODipine (NORVASC) 10 MG tablet Take 1 tablet (10 mg total) by mouth daily. 02/28/17   Bartholomew Crews, MD  ascorbic acid (VITAMIN C) 500 MG tablet Take 500 mg by mouth daily.    Historical Provider, MD  atorvastatin (LIPITOR) 40 MG tablet Take 1 tablet (40 mg total) by mouth daily. 02/28/17   Bartholomew Crews, MD  buPROPion (WELLBUTRIN XL) 300 MG 24 hr tablet Take 1 tablet (300 mg total) by mouth every morning. 02/27/17   Kathlee Nations, MD  Calcium Carbonate-Vitamin D (CALCIUM-VITAMIN D) 500-200 MG-UNIT per tablet Take 1 tablet by mouth daily.      Historical Provider, MD  docusate sodium (COLACE) 100 MG capsule Take 1 capsule (100 mg total) by mouth every 12 (twelve) hours. 12/21/13   Elnora Morrison, MD  fluocinonide cream (LIDEX) 0.05 % APPLY TO AFFECTED AREA TWICE DAILY 11/02/16   Bartholomew Crews, MD  fluticasone [facility]) 50 MCG/ACT nasal spray INSTILL 2 SPRAYS IN EACH NOSTRIL DAILY 02/28/17   Bartholomew Crews, MD  furosemide (LASIX) 20 MG tablet TAKE ONE TABLET BY MOUTH DAILY. MAY TAKE AN EXTRA ONE AS NEEDED ONCE DAILY IF SWELLING OR EDEMA IS BAD 02/28/17   Bartholomew Crews, MD  HYDROcodone-acetaminophen (NORCO/VICODIN) 5-325 MG tablet Take 1-2 tablets by mouth every 6 (six) hours as needed for moderate pain. 02/15/17   Earnie Larsson, MD  hydrOXYzine (VISTARIL) 25 MG capsule Take 1 capsule (25 mg total) by mouth 2 (two) times daily. 02/27/17   Kathlee Nations, MD  losartan (COZAAR) 100 MG tablet Take 1 tablet (100 mg total) by mouth daily. 02/28/17   Bartholomew Crews, MD  Multiple Vitamins-Minerals (ALIVE WOMENS ENERGY) TABS Take by mouth.    Historical Provider, MD  nystatin (MYCOSTATIN/NYSTOP) powder APPLY TO AFFECTED AREA TWICE DAILY 02/28/17   Bartholomew Crews, MD  OVER THE COUNTER MEDICATION Calciboost capsule: Take 1 capsule by mouth once a day     Historical Provider, MD  oxybutynin (DITROPAN-XL) 5 MG 24 hr tablet Take 1 tablet (5 mg total) by mouth at bedtime. 02/28/17   Bartholomew Crews, MD  pantoprazole (PROTONIX) 40 MG tablet TAKE 1 TABLET BY MOUTH TWICE DAILY BEFORE A MEAL 02/28/17   Bartholomew Crews, MD  potassium chloride (MICRO-K) 10 MEQ CR capsule Take 1 capsule (10 mEq total) by mouth daily. 02/28/17   Bartholomew Crews, MD  senna (SENOKOT) 8.6 MG TABS tablet Take 1 tablet by mouth every other day.    Historical Provider, MD  traMADol (ULTRAM) 50 MG tablet TAKE 1 TABLET BY MOUTH TWICE DAILY AS NEEDED Patient taking differently: Take 50 mg by mouth two times a day as needed for pain 01/29/17   Bartholomew Crews, MD    Family History Family History  Problem Relation Age of Onset  .  Diabetes Mother   . Heart disease Mother   . Hearing loss Mother   . Stroke Mother   . Post-traumatic stress disorder Father   . Schizophrenia Maternal Grandmother   . Diabetes Maternal Grandfather   . Other Brother     brother died in mental health hospital    Social History Social History  Substance Use Topics  . Smoking status: Former Smoker    Packs/day: 0.10    Types: Cigarettes    Quit date: 04/08/2013  . Smokeless tobacco: Never Used  . Alcohol use No     Allergies   Aspirin and Sulfamethoxazole-trimethoprim   Review of Systems Review of Systems  Eyes: Negative for visual disturbance.  Neurological: Negative for syncope and headaches.  All other systems reviewed and are negative.    Physical Exam Updated Vital Signs BP 133/70 (BP Location: Right Arm)   Pulse 79   Temp 98.5 F (36.9 C) (Oral)   Resp 18   SpO2 95%   Physical Exam  Constitutional: She is oriented to person, place, and time. She appears well-developed and well-nourished. No distress.  HENT:  Head: Normocephalic and atraumatic.  Right Ear: Hearing normal.  Left Ear: Hearing normal.  Nose: Nose normal.  Mouth/Throat: Oropharynx is clear and  moist and mucous membranes are normal.  There are surgical incisions noted to both parietal regions. These appear to be healing well.   Eyes: Conjunctivae and EOM are normal. Pupils are equal, round, and reactive to light.  Neck: Normal range of motion. Neck supple.  Cardiovascular: Regular rhythm, S1 normal and S2 normal.  Exam reveals no gallop and no friction rub.   No murmur heard. Pulmonary/Chest: Effort normal and breath sounds normal. No respiratory distress. She exhibits no tenderness.  Abdominal: Soft. Normal appearance and bowel sounds are normal. There is no hepatosplenomegaly. There is no tenderness. There is no rebound, no guarding, no tenderness at McBurney's point and negative Murphy's sign. No hernia.  Musculoskeletal: Normal range of motion.  Neurological: She is alert and oriented to person, place, and time. She has normal strength. No cranial nerve deficit or sensory deficit. Coordination normal. GCS eye subscore is 4. GCS verbal subscore is 5. GCS motor subscore is 6.  Skin: Skin is warm, dry and intact. No rash noted. No cyanosis.  Psychiatric: She has a normal mood and affect. Her speech is normal and behavior is normal. Thought content normal.  Nursing note and vitals reviewed.    ED Treatments / Results  DIAGNOSTIC STUDIES: Oxygen Saturation is 98% on RA, normal by my interpretation.   COORDINATION OF CARE: 2:27 AM-Discussed next steps with pt. Pt verbalized understanding and is agreeable with the plan.    Labs (all labs ordered are listed, but only abnormal results are displayed) Labs Reviewed  BASIC METABOLIC PANEL  CBC WITH DIFFERENTIAL/PLATELET    EKG  EKG Interpretation None       Radiology No results found.  Procedures Procedures (including critical care time)  Medications Ordered in ED Medications - No data to display   Initial Impression / Assessment and Plan / ED Course  I have reviewed the triage vital signs and the nursing  notes.  Pertinent labs & imaging results that were available during my care of the patient were reviewed by me and considered in my medical decision making (see chart for details).  Patient with history of recent subdural hematomas evacuated by neurosurgery. She presents today for evaluation of a fall. She reports slipping out  of her chair and hitting her head on the armrest. She denies any loss of consciousness or severe headache.  She is neurologically intact and CT scan shows no acute findings. She does have residual subdural hematomas, however to a lesser degree than when she left the hospital. Her CT scan today looks better than prior studies. I see no indication for further consultation or treatment. She will be discharged, to follow-up with her neurosurgeon as scheduled.  Final Clinical Impressions(s) / ED Diagnoses   Final diagnoses:  None    New Prescriptions New Prescriptions   No medications on file   I personally performed the services described in this documentation, which was scribed in my presence. The recorded information has been reviewed and is accurate.       Veryl Speak, MD 03/01/17 0600

## 2017-03-01 NOTE — ED Notes (Signed)
The pt is now c/o pain in the back of her head and in her lower back.  Her daughter left but   The pt has called her and she is coming back to get her.  She wants vicodin  Before she leaves.  Dr Woodward Ku it  given

## 2017-03-01 NOTE — ED Notes (Signed)
The pts family has gone home  The pt is not c/o any pain  She is very hard of hearing.   c/o being cold   Blankets given

## 2017-03-01 NOTE — Discharge Instructions (Signed)
Follow-up with Dr. Annette Stable as scheduled, and return to the emergency department if you develop severe headache, confusion, or other new and concerning symptoms.

## 2017-03-01 NOTE — ED Notes (Signed)
The pts pupils are equal and react size 2.0  Equal grips  She reports that both her legs are swollen and she is unable to move them as well as usual

## 2017-03-01 NOTE — ED Triage Notes (Signed)
Pt arrives via EMS for fall this morning after attempting to get in bed, hit her head on rail. PTAR reports bed is 3-4 feet in the air. Hx of subdural hematoma surgery two weeks ago.

## 2017-03-02 ENCOUNTER — Ambulatory Visit: Payer: PPO

## 2017-03-02 DIAGNOSIS — Z8673 Personal history of transient ischemic attack (TIA), and cerebral infarction without residual deficits: Secondary | ICD-10-CM | POA: Diagnosis not present

## 2017-03-02 DIAGNOSIS — G4733 Obstructive sleep apnea (adult) (pediatric): Secondary | ICD-10-CM | POA: Diagnosis not present

## 2017-03-02 DIAGNOSIS — Z48812 Encounter for surgical aftercare following surgery on the circulatory system: Secondary | ICD-10-CM | POA: Diagnosis not present

## 2017-03-02 DIAGNOSIS — I130 Hypertensive heart and chronic kidney disease with heart failure and stage 1 through stage 4 chronic kidney disease, or unspecified chronic kidney disease: Secondary | ICD-10-CM | POA: Diagnosis not present

## 2017-03-02 DIAGNOSIS — F2089 Other schizophrenia: Secondary | ICD-10-CM | POA: Diagnosis not present

## 2017-03-02 DIAGNOSIS — M109 Gout, unspecified: Secondary | ICD-10-CM | POA: Diagnosis not present

## 2017-03-02 DIAGNOSIS — Z79891 Long term (current) use of opiate analgesic: Secondary | ICD-10-CM | POA: Diagnosis not present

## 2017-03-02 DIAGNOSIS — I5032 Chronic diastolic (congestive) heart failure: Secondary | ICD-10-CM | POA: Diagnosis not present

## 2017-03-02 DIAGNOSIS — Z7951 Long term (current) use of inhaled steroids: Secondary | ICD-10-CM | POA: Diagnosis not present

## 2017-03-02 DIAGNOSIS — N183 Chronic kidney disease, stage 3 (moderate): Secondary | ICD-10-CM | POA: Diagnosis not present

## 2017-03-02 DIAGNOSIS — Z8541 Personal history of malignant neoplasm of cervix uteri: Secondary | ICD-10-CM | POA: Diagnosis not present

## 2017-03-06 ENCOUNTER — Encounter (HOSPITAL_COMMUNITY): Payer: Self-pay | Admitting: *Deleted

## 2017-03-06 ENCOUNTER — Emergency Department (HOSPITAL_COMMUNITY): Payer: PPO

## 2017-03-06 ENCOUNTER — Observation Stay (HOSPITAL_COMMUNITY): Payer: PPO

## 2017-03-06 ENCOUNTER — Inpatient Hospital Stay (HOSPITAL_COMMUNITY)
Admission: EM | Admit: 2017-03-06 | Discharge: 2017-03-08 | DRG: 083 | Payer: PPO | Attending: Neurosurgery | Admitting: Neurosurgery

## 2017-03-06 DIAGNOSIS — R296 Repeated falls: Secondary | ICD-10-CM | POA: Diagnosis present

## 2017-03-06 DIAGNOSIS — S065X9A Traumatic subdural hemorrhage with loss of consciousness of unspecified duration, initial encounter: Secondary | ICD-10-CM | POA: Diagnosis not present

## 2017-03-06 DIAGNOSIS — Z96643 Presence of artificial hip joint, bilateral: Secondary | ICD-10-CM | POA: Diagnosis not present

## 2017-03-06 DIAGNOSIS — S065XAA Traumatic subdural hemorrhage with loss of consciousness status unknown, initial encounter: Secondary | ICD-10-CM

## 2017-03-06 DIAGNOSIS — I341 Nonrheumatic mitral (valve) prolapse: Secondary | ICD-10-CM | POA: Diagnosis not present

## 2017-03-06 DIAGNOSIS — R413 Other amnesia: Secondary | ICD-10-CM | POA: Diagnosis not present

## 2017-03-06 DIAGNOSIS — Z8673 Personal history of transient ischemic attack (TIA), and cerebral infarction without residual deficits: Secondary | ICD-10-CM

## 2017-03-06 DIAGNOSIS — E785 Hyperlipidemia, unspecified: Secondary | ICD-10-CM | POA: Diagnosis present

## 2017-03-06 DIAGNOSIS — K219 Gastro-esophageal reflux disease without esophagitis: Secondary | ICD-10-CM | POA: Diagnosis not present

## 2017-03-06 DIAGNOSIS — F329 Major depressive disorder, single episode, unspecified: Secondary | ICD-10-CM | POA: Diagnosis present

## 2017-03-06 DIAGNOSIS — M5137 Other intervertebral disc degeneration, lumbosacral region: Secondary | ICD-10-CM | POA: Diagnosis not present

## 2017-03-06 DIAGNOSIS — W0110XA Fall on same level from slipping, tripping and stumbling with subsequent striking against unspecified object, initial encounter: Secondary | ICD-10-CM | POA: Diagnosis not present

## 2017-03-06 DIAGNOSIS — M109 Gout, unspecified: Secondary | ICD-10-CM | POA: Diagnosis present

## 2017-03-06 DIAGNOSIS — Z9889 Other specified postprocedural states: Secondary | ICD-10-CM

## 2017-03-06 DIAGNOSIS — Z87891 Personal history of nicotine dependence: Secondary | ICD-10-CM

## 2017-03-06 DIAGNOSIS — Z886 Allergy status to analgesic agent status: Secondary | ICD-10-CM

## 2017-03-06 DIAGNOSIS — T380X5A Adverse effect of glucocorticoids and synthetic analogues, initial encounter: Secondary | ICD-10-CM | POA: Diagnosis present

## 2017-03-06 DIAGNOSIS — S0181XA Laceration without foreign body of other part of head, initial encounter: Secondary | ICD-10-CM | POA: Diagnosis not present

## 2017-03-06 DIAGNOSIS — Y92000 Kitchen of unspecified non-institutional (private) residence as  the place of occurrence of the external cause: Secondary | ICD-10-CM

## 2017-03-06 DIAGNOSIS — Y93G3 Activity, cooking and baking: Secondary | ICD-10-CM | POA: Diagnosis not present

## 2017-03-06 DIAGNOSIS — I129 Hypertensive chronic kidney disease with stage 1 through stage 4 chronic kidney disease, or unspecified chronic kidney disease: Secondary | ICD-10-CM | POA: Diagnosis present

## 2017-03-06 DIAGNOSIS — Z8614 Personal history of Methicillin resistant Staphylococcus aureus infection: Secondary | ICD-10-CM | POA: Diagnosis not present

## 2017-03-06 DIAGNOSIS — M1990 Unspecified osteoarthritis, unspecified site: Secondary | ICD-10-CM | POA: Diagnosis present

## 2017-03-06 DIAGNOSIS — N183 Chronic kidney disease, stage 3 (moderate): Secondary | ICD-10-CM | POA: Diagnosis present

## 2017-03-06 DIAGNOSIS — S79912A Unspecified injury of left hip, initial encounter: Secondary | ICD-10-CM | POA: Diagnosis not present

## 2017-03-06 DIAGNOSIS — I872 Venous insufficiency (chronic) (peripheral): Secondary | ICD-10-CM | POA: Diagnosis not present

## 2017-03-06 DIAGNOSIS — Z79899 Other long term (current) drug therapy: Secondary | ICD-10-CM

## 2017-03-06 DIAGNOSIS — S069X9A Unspecified intracranial injury with loss of consciousness of unspecified duration, initial encounter: Secondary | ICD-10-CM

## 2017-03-06 DIAGNOSIS — S0990XA Unspecified injury of head, initial encounter: Secondary | ICD-10-CM | POA: Diagnosis not present

## 2017-03-06 DIAGNOSIS — Z882 Allergy status to sulfonamides status: Secondary | ICD-10-CM | POA: Diagnosis not present

## 2017-03-06 DIAGNOSIS — M25552 Pain in left hip: Secondary | ICD-10-CM | POA: Diagnosis not present

## 2017-03-06 DIAGNOSIS — D62 Acute posthemorrhagic anemia: Secondary | ICD-10-CM | POA: Diagnosis present

## 2017-03-06 DIAGNOSIS — S069XAA Unspecified intracranial injury with loss of consciousness status unknown, initial encounter: Secondary | ICD-10-CM

## 2017-03-06 DIAGNOSIS — M1610 Unilateral primary osteoarthritis, unspecified hip: Secondary | ICD-10-CM | POA: Diagnosis present

## 2017-03-06 DIAGNOSIS — H903 Sensorineural hearing loss, bilateral: Secondary | ICD-10-CM | POA: Diagnosis not present

## 2017-03-06 DIAGNOSIS — S065X0A Traumatic subdural hemorrhage without loss of consciousness, initial encounter: Secondary | ICD-10-CM | POA: Diagnosis not present

## 2017-03-06 DIAGNOSIS — W19XXXA Unspecified fall, initial encounter: Secondary | ICD-10-CM

## 2017-03-06 LAB — CBC WITH DIFFERENTIAL/PLATELET
Basophils Absolute: 0 10*3/uL (ref 0.0–0.1)
Basophils Relative: 0 %
Eosinophils Absolute: 0.5 10*3/uL (ref 0.0–0.7)
Eosinophils Relative: 7 %
HCT: 34.8 % — ABNORMAL LOW (ref 36.0–46.0)
Hemoglobin: 11.2 g/dL — ABNORMAL LOW (ref 12.0–15.0)
Lymphocytes Relative: 23 %
Lymphs Abs: 1.7 10*3/uL (ref 0.7–4.0)
MCH: 30.3 pg (ref 26.0–34.0)
MCHC: 32.2 g/dL (ref 30.0–36.0)
MCV: 94.1 fL (ref 78.0–100.0)
Monocytes Absolute: 0.4 10*3/uL (ref 0.1–1.0)
Monocytes Relative: 5 %
Neutro Abs: 4.7 10*3/uL (ref 1.7–7.7)
Neutrophils Relative %: 65 %
Platelets: 224 10*3/uL (ref 150–400)
RBC: 3.7 MIL/uL — ABNORMAL LOW (ref 3.87–5.11)
RDW: 15.4 % (ref 11.5–15.5)
WBC: 7.3 10*3/uL (ref 4.0–10.5)

## 2017-03-06 LAB — COMPREHENSIVE METABOLIC PANEL
ALT: 19 U/L (ref 14–54)
AST: 25 U/L (ref 15–41)
Albumin: 3.8 g/dL (ref 3.5–5.0)
Alkaline Phosphatase: 107 U/L (ref 38–126)
Anion gap: 10 (ref 5–15)
BUN: 11 mg/dL (ref 6–20)
CO2: 21 mmol/L — ABNORMAL LOW (ref 22–32)
Calcium: 9.5 mg/dL (ref 8.9–10.3)
Chloride: 110 mmol/L (ref 101–111)
Creatinine, Ser: 1.18 mg/dL — ABNORMAL HIGH (ref 0.44–1.00)
GFR calc Af Amer: 51 mL/min — ABNORMAL LOW (ref >60)
GFR calc non Af Amer: 44 mL/min — ABNORMAL LOW (ref >60)
Glucose, Bld: 103 mg/dL — ABNORMAL HIGH (ref 65–99)
Potassium: 3.6 mmol/L (ref 3.5–5.1)
Sodium: 141 mmol/L (ref 135–145)
Total Bilirubin: 0.8 mg/dL (ref 0.3–1.2)
Total Protein: 7.1 g/dL (ref 6.5–8.1)

## 2017-03-06 LAB — MRSA PCR SCREENING: MRSA by PCR: NEGATIVE

## 2017-03-06 MED ORDER — HYDROXYZINE HCL 25 MG PO TABS
25.0000 mg | ORAL_TABLET | Freq: Every day | ORAL | Status: DC
  Administered 2017-03-06 – 2017-03-07 (×2): 25 mg via ORAL
  Filled 2017-03-06 (×2): qty 1

## 2017-03-06 MED ORDER — PANTOPRAZOLE SODIUM 20 MG PO TBEC
20.0000 mg | DELAYED_RELEASE_TABLET | Freq: Two times a day (BID) | ORAL | Status: DC
  Administered 2017-03-06 – 2017-03-08 (×4): 20 mg via ORAL
  Filled 2017-03-06 (×6): qty 1

## 2017-03-06 MED ORDER — DOCUSATE SODIUM 100 MG PO CAPS: 100.0000 mg | ORAL_CAPSULE | Freq: Two times a day (BID) | ORAL | Status: DC

## 2017-03-06 MED ORDER — HYDROCODONE-ACETAMINOPHEN 5-325 MG PO TABS
1.0000 | ORAL_TABLET | ORAL | Status: DC | PRN
  Administered 2017-03-06: 1 via ORAL
  Administered 2017-03-07 (×2): 2 via ORAL
  Filled 2017-03-06: qty 1
  Filled 2017-03-06 (×2): qty 2

## 2017-03-06 MED ORDER — OXYBUTYNIN CHLORIDE ER 5 MG PO TB24
5.0000 mg | ORAL_TABLET | Freq: Every day | ORAL | Status: DC
  Administered 2017-03-06 – 2017-03-07 (×2): 5 mg via ORAL
  Filled 2017-03-06 (×3): qty 1

## 2017-03-06 MED ORDER — ALLOPURINOL 300 MG PO TABS
300.0000 mg | ORAL_TABLET | Freq: Every day | ORAL | Status: DC
  Administered 2017-03-06 – 2017-03-08 (×3): 300 mg via ORAL
  Filled 2017-03-06 (×4): qty 1

## 2017-03-06 MED ORDER — MAGNESIUM CITRATE PO SOLN: 1.0000 | Freq: Once | ORAL | Status: DC | PRN

## 2017-03-06 MED ORDER — AMLODIPINE BESYLATE 10 MG PO TABS
10.0000 mg | ORAL_TABLET | Freq: Every day | ORAL | Status: DC
  Administered 2017-03-06 – 2017-03-08 (×3): 10 mg via ORAL
  Filled 2017-03-06 (×2): qty 1
  Filled 2017-03-06: qty 2

## 2017-03-06 MED ORDER — ACETAMINOPHEN 325 MG PO TABS
650.0000 mg | ORAL_TABLET | Freq: Four times a day (QID) | ORAL | Status: DC | PRN
  Administered 2017-03-06: 650 mg via ORAL
  Filled 2017-03-06 (×3): qty 2

## 2017-03-06 MED ORDER — POLYETHYLENE GLYCOL 3350 17 G PO PACK: 17.0000 g | PACK | Freq: Every day | ORAL | Status: DC | PRN

## 2017-03-06 MED ORDER — POTASSIUM CHLORIDE CRYS ER 10 MEQ PO TBCR
10.0000 meq | EXTENDED_RELEASE_TABLET | Freq: Every day | ORAL | Status: DC
  Administered 2017-03-06 – 2017-03-08 (×3): 10 meq via ORAL
  Filled 2017-03-06 (×3): qty 1

## 2017-03-06 MED ORDER — SODIUM CHLORIDE 0.9 % IV SOLN: INTRAVENOUS | Status: DC

## 2017-03-06 MED ORDER — SODIUM CHLORIDE 0.9% FLUSH: 3.0000 mL | INTRAVENOUS | Status: DC | PRN

## 2017-03-06 MED ORDER — LOSARTAN POTASSIUM 50 MG PO TABS
100.0000 mg | ORAL_TABLET | Freq: Every day | ORAL | Status: DC
  Administered 2017-03-06 – 2017-03-08 (×3): 100 mg via ORAL
  Filled 2017-03-06 (×4): qty 2

## 2017-03-06 MED ORDER — ZOLPIDEM TARTRATE 5 MG PO TABS: 5.0000 mg | ORAL_TABLET | Freq: Every evening | ORAL | Status: DC | PRN

## 2017-03-06 MED ORDER — SODIUM CHLORIDE 0.9 % IV SOLN: 250.0000 mL | INTRAVENOUS | Status: DC | PRN

## 2017-03-06 MED ORDER — SODIUM CHLORIDE 0.9% FLUSH
3.0000 mL | Freq: Two times a day (BID) | INTRAVENOUS | Status: DC
  Administered 2017-03-06 – 2017-03-08 (×4): 3 mL via INTRAVENOUS

## 2017-03-06 MED ORDER — ONDANSETRON HCL 4 MG/2ML IJ SOLN: 4.0000 mg | Freq: Four times a day (QID) | INTRAMUSCULAR | Status: DC | PRN

## 2017-03-06 MED ORDER — ONDANSETRON HCL 4 MG PO TABS: 4.0000 mg | ORAL_TABLET | Freq: Four times a day (QID) | ORAL | Status: DC | PRN

## 2017-03-06 MED ORDER — BISACODYL 10 MG RE SUPP: 10.0000 mg | Freq: Every day | RECTAL | Status: DC | PRN

## 2017-03-06 MED ORDER — TRAMADOL HCL 50 MG PO TABS
50.0000 mg | ORAL_TABLET | Freq: Two times a day (BID) | ORAL | Status: DC | PRN
  Administered 2017-03-06: 50 mg via ORAL
  Filled 2017-03-06: qty 1

## 2017-03-06 MED ORDER — ATORVASTATIN CALCIUM 40 MG PO TABS
40.0000 mg | ORAL_TABLET | Freq: Every day | ORAL | Status: DC
  Administered 2017-03-06 – 2017-03-08 (×3): 40 mg via ORAL
  Filled 2017-03-06 (×4): qty 1

## 2017-03-06 MED ORDER — DOCUSATE SODIUM 100 MG PO CAPS
100.0000 mg | ORAL_CAPSULE | Freq: Two times a day (BID) | ORAL | Status: DC
  Administered 2017-03-06 – 2017-03-08 (×4): 100 mg via ORAL
  Filled 2017-03-06 (×4): qty 1

## 2017-03-06 MED ORDER — ACETAMINOPHEN 650 MG RE SUPP: 650.0000 mg | Freq: Four times a day (QID) | RECTAL | Status: DC | PRN

## 2017-03-06 MED ORDER — FUROSEMIDE 40 MG PO TABS
20.0000 mg | ORAL_TABLET | Freq: Every day | ORAL | Status: DC
  Administered 2017-03-06 – 2017-03-08 (×3): 20 mg via ORAL
  Filled 2017-03-06 (×3): qty 1

## 2017-03-06 MED ORDER — BUPROPION HCL ER (XL) 150 MG PO TB24
300.0000 mg | ORAL_TABLET | Freq: Every morning | ORAL | Status: DC
  Administered 2017-03-06 – 2017-03-08 (×3): 300 mg via ORAL
  Filled 2017-03-06 (×3): qty 2

## 2017-03-06 NOTE — H&P (Signed)
CC:  Chief Complaint  Patient presents with  . Head Injury    HPI: [name] is a 76 y.o. female who presented to the ER after falling and hitting head earlier today. She is s/p craniotomy for evacuation of SDH in April by Dr [name]. She was on her way to an OV to discuss recurrent falls and possible SNF placement with Dr [name] when she fell and hit her head. No LOC. Daughter is present in exam room. Reports multiple falls since discharge from hospital ("too many to count") as well as generalized weakness and unsteadiness. Presented to ER on 5/3 for a fall and had a stable repeat CT head. Patient lives with daughter and daughter feels as though she is unable to care for pt to the extent that the patient needs. Pt Is ambulating with a walker, but still falling. Was supposed to have [facility], but has only seen the therapist twice. Daughter would like patient to be in a rehab facility until she is ready to go home. Patient feels well otherwise. Denies neuro symptoms.  PMH: Past Medical History:  Diagnosis Date  . Chronic venous insufficiency   . Colon, diverticulosis Sept. 2011   outpatient colonoscopy by Dr. Cristina Gong.  Need record  . DDD (degenerative disc disease), lumbosacral   . Depression   . DJD (degenerative joint disease), multiple sites    Low back pain worst  . GERD (gastroesophageal reflux disease)   . Gout   . Gout    Uric Acid level 4.2 on 300 allopurinol  . History of cervical cancer 1972  . Hx-TIA (transient ischemic attack) 05/13/2001   Right facial numbness  . Hyperlipidemia   . Hypertension   . Memory disorder 12/20/2016  . Mitral valve prolapse 10/27/1999   Subsequent 2D echo show normal mitral valve  . Osteoarthritis of hip    bilateral hips  . Ovarian cancer ([place]) 1972   S/P oophorectomy  . Psychosis   . Recurrent boils    History of MRSA skin infections with abscess  . Sensorineural hearing loss of both ears   . Subdural hematoma ([place]) 01/26/2017   bilateral     PSH: Past Surgical History:  Procedure Laterality Date  . ABDOMINAL HYSTERECTOMY     1972  . BURR HOLE Bilateral 02/12/2017   Procedure: Haskell Flirt;  Surgeon: Earnie Larsson, MD;  Location: [place];  Service: Neurosurgery;  Laterality: Bilateral;  . CERVICAL DISCECTOMY  7/07   C5-C6  . JOINT REPLACEMENT     bilateral hip replacement  . LUMBAR DISC SURGERY     L5-S1 7/07  . LUMBAR LAMINECTOMY/DECOMPRESSION MICRODISCECTOMY N/A 02/02/2016   Procedure: L4-5 Decompression;  Surgeon: Marybelle Killings, MD;  Location: [place];  Service: Orthopedics;  Laterality: N/A;  . OOPHORECTOMY     1972 for ovarian cancer    SH: Social History  Substance Use Topics  . Smoking status: Former Smoker    Packs/day: 0.10    Types: Cigarettes    Quit date: 04/08/2013  . Smokeless tobacco: Never Used  . Alcohol use No    MEDS: Prior to Admission medications   Medication Sig Start Date End Date Taking? Authorizing Provider  allopurinol (ZYLOPRIM) 300 MG tablet Take 1 tablet (300 mg total) by mouth daily. 02/28/17  Yes Bartholomew Crews, MD  amLODipine (NORVASC) 10 MG tablet Take 1 tablet (10 mg total) by mouth daily. 02/28/17  Yes Bartholomew Crews, MD  ascorbic acid (VITAMIN C) 500 MG tablet  Take 500 mg by mouth daily.   Yes [provider]  atorvastatin (LIPITOR) 40 MG tablet Take 1 tablet (40 mg total) by mouth daily. 02/28/17  Yes Bartholomew Crews, MD  buPROPion (WELLBUTRIN XL) 300 MG 24 hr tablet Take 1 tablet (300 mg total) by mouth every morning. 02/27/17  Yes Arfeen, Arlyce Harman, MD  fluocinonide cream (LIDEX) 0.05 % APPLY TO AFFECTED AREA TWICE DAILY 11/02/16  Yes Bartholomew Crews, MD  fluticasone [facility]) 50 MCG/ACT nasal spray INSTILL 2 SPRAYS IN EACH NOSTRIL DAILY 02/28/17  Yes Bartholomew Crews, MD  furosemide (LASIX) 20 MG tablet TAKE ONE TABLET BY MOUTH DAILY. MAY TAKE AN EXTRA ONE AS NEEDED ONCE DAILY IF SWELLING OR EDEMA IS BAD 02/28/17  Yes Bartholomew Crews, MD   HYDROcodone-acetaminophen (NORCO/VICODIN) 5-325 MG tablet Take 1-2 tablets by mouth every 6 (six) hours as needed for moderate pain. 02/15/17  Yes Pool, Mallie Mussel, MD  hydrOXYzine (VISTARIL) 25 MG capsule Take 1 capsule (25 mg total) by mouth 2 (two) times daily. Patient taking differently: Take 25 mg by mouth at bedtime.  02/27/17  Yes Arfeen, Arlyce Harman, MD  losartan (COZAAR) 100 MG tablet Take 1 tablet (100 mg total) by mouth daily. 02/28/17  Yes Bartholomew Crews, MD  Multiple Vitamins-Minerals (ALIVE WOMENS ENERGY) TABS Take 1 tablet by mouth daily.    Yes [provider]  nystatin (MYCOSTATIN/NYSTOP) powder APPLY TO AFFECTED AREA TWICE DAILY 02/28/17  Yes Bartholomew Crews, MD  OVER THE COUNTER MEDICATION Calciboost capsule: Take 1 capsule by mouth once a day   Yes [provider]  oxybutynin (DITROPAN-XL) 5 MG 24 hr tablet Take 1 tablet (5 mg total) by mouth at bedtime. 02/28/17  Yes Bartholomew Crews, MD  pantoprazole (PROTONIX) 40 MG tablet TAKE 1 TABLET BY MOUTH TWICE DAILY BEFORE A MEAL 02/28/17  Yes Bartholomew Crews, MD  potassium chloride (MICRO-K) 10 MEQ CR capsule Take 1 capsule (10 mEq total) by mouth daily. 02/28/17  Yes Bartholomew Crews, MD  traMADol (ULTRAM) 50 MG tablet TAKE 1 TABLET BY MOUTH TWICE DAILY AS NEEDED Patient taking differently: Take 50 mg by mouth two times a day as needed for pain 01/29/17  Yes Bartholomew Crews, MD  docusate sodium (COLACE) 100 MG capsule Take 1 capsule (100 mg total) by mouth every 12 (twelve) hours. Patient not taking: Reported on 03/06/2017 12/21/13   Elnora Morrison, MD    ALLERGY: Allergies  Allergen Reactions  . Aspirin Other (See Comments)    Bleeding from vagina   . Sulfamethoxazole-Trimethoprim Itching    ROS: Review of Systems  Constitutional: Negative.   HENT: Negative.   Eyes: Negative.   Respiratory: Negative.   Cardiovascular: Negative.   Gastrointestinal: Negative.   Genitourinary: Negative.    Musculoskeletal: Negative.   Skin: Negative.   Neurological: Negative for dizziness, tingling, tremors, sensory change, speech change, focal weakness, seizures, loss of consciousness and headaches.  Endo/Heme/Allergies: Bruises/bleeds easily.    Vitals:   03/06/17 1233  BP: (!) 141/105  Pulse: 74  Resp: 20  Temp: 98.2 F (36.8 C)   General appearance: WDWN, NAD Eyes: PERRL Cardiovascular: Regular rate and rhythm without murmurs, rubs, gallops. No edema or variciosities. Distal pulses normal. Pulmonary: Clear to auscultation Musculoskeletal:     Muscle tone upper extremities: Normal    Muscle tone lower extremities: Normal    Motor exam: Upper Extremities Deltoid Bicep Tricep Grip  Right 5/5 5/5 5/5 5/5  Left 5/5  5/5 5/5 5/5   Lower Extremity IP Quad PF DF EHL  Right 5/5 5/5 5/5 5/5 5/5  Left 5/5 5/5 5/5 5/5 5/5   Neurological Awake, alert, oriented Memory and concentration grossly intact Speech fluent, appropriate CNII: Visual fields normal CNIII/IV/VI: EOMI CNV: Facial sensation normal CNVII: Symmetric, normal strength CNVIII: Grossly normal CNIX: Normal palate movement CNXI: Trap and SCM strength normal CN XII: Tongue protrusion normal Sensation grossly intact to LT DTR: Normal  IMAGING: CT HEAD IMPRESSION: 1. Increased size of left extra-axial collection compatible with acute on chronic subdural hematoma. 2. Increased left-sided mass effect with now 4 mm of left to right midline shift. 3. Stable appearance of mixed attenuation right subdural hematoma.  IMPRESSION/PLAN - 76 y.o. female, who presented to ER after a fall today and hitting head CT shows a fairly stable SDH. She denies neuro symptoms and is neurologically intact. She will be admitted for observation. Neuro exam q 2 hours. Monitor for worsening. Repeat head CT, only if exam worsens. Daughter feels uncomfortable being the primary caregiver due to her falls. Will get CSW consult & CIR consult for  possible placement. Call for any concerns.

## 2017-03-06 NOTE — ED Provider Notes (Signed)
[name] DEPT Provider Note   CSN: [id number] Arrival date & time: 03/06/17  1235     History   Chief Complaint Chief Complaint  Patient presents with  . Head Injury    HPI [name] is a 76 y.o. female.  HPI   76 year old female with a history of bilateral subdural hematomas with bilateral bur hole evacuation done with Dr. [name] in April, recent falls, presents with concern for fall and head trauma today. Patient reports that she was boiling eggs, and then slipped on the kitchen floor, hitting the back of her head. Reports she had a loss of consciousness for approximately 2 minutes. Denies neck pain, new numbness, weakness. Reports she's had chronic bilateral lower extremity weakness since her surgery and this is unchanged. Reports history of hip replacement, reports some left-sided hip pain. Denies other acute pain or changes in status.   Past Medical History:  Diagnosis Date  . Chronic venous insufficiency   . Colon, diverticulosis Sept. 2011   outpatient colonoscopy by Dr. Cristina Gong.  Need record  . DDD (degenerative disc disease), lumbosacral   . Depression   . DJD (degenerative joint disease), multiple sites    Low back pain worst  . GERD (gastroesophageal reflux disease)   . Gout   . Gout    Uric Acid level 4.2 on 300 allopurinol  . History of cervical cancer 1972  . Hx-TIA (transient ischemic attack) 05/13/2001   Right facial numbness  . Hyperlipidemia   . Hypertension   . Memory disorder 12/20/2016  . Mitral valve prolapse 10/27/1999   Subsequent 2D echo show normal mitral valve  . Osteoarthritis of hip    bilateral hips  . Ovarian cancer ([place]) 1972   S/P oophorectomy  . Psychosis   . Recurrent boils    History of MRSA skin infections with abscess  . Sensorineural hearing loss of both ears   . Subdural hematoma ([place]) 01/26/2017   bilateral    Patient Active Problem List   Diagnosis Date Noted  . Subdural hematoma ([place]) 03/06/2017  . SDH  (subdural hematoma) ([place]) 02/10/2017  . Subdural hematoma without coma ([place]) 02/09/2017  . Acute low back pain 02/04/2017  . Memory disorder 12/20/2016  . History of lumbar laminectomy for spinal cord decompression 02/02/2016  . Atopic dermatitis 07/09/2015  . Bereavement 12/01/2014  . Psychosis 12/01/2014  . Reactive airway disease 08/11/2014  . History of cancer 08/11/2014  . OSA (obstructive sleep apnea) 08/11/2014  . Healthcare maintenance 07/17/2013  . Chronic kidney disease (CKD), stage III (moderate) 10/04/2012  . Schizophrenia, chronic condition ([place]) 01/20/2012  . Postmenopausal atrophic vaginitis 11/01/2010  . Chronic diastolic heart failure ([place]) 05/06/2010  . Allergic rhinitis 10/14/2009  . Obesity, Class II, BMI 35-39.9, with comorbidity 05/14/2009  . Chronic prescription opiate use 04/29/2008  . Normocytic anemia 09/25/2007  . Hyperlipidemia 09/03/2006  . Gout 09/03/2006  . Essential hypertension 09/03/2006  . GERD 09/03/2006  . Hx-TIA (transient ischemic attack) 05/13/2001    Past Surgical History:  Procedure Laterality Date  . ABDOMINAL HYSTERECTOMY     1972  . BURR HOLE Bilateral 02/12/2017   Procedure: Haskell Flirt;  Surgeon: Earnie Larsson, MD;  Location: [facility];  Service: Neurosurgery;  Laterality: Bilateral;  . CERVICAL DISCECTOMY  7/07   C5-C6  . JOINT REPLACEMENT     bilateral hip replacement  . LUMBAR DISC SURGERY     L5-S1 7/07  . LUMBAR LAMINECTOMY/DECOMPRESSION MICRODISCECTOMY N/A 02/02/2016   Procedure: L4-5 Decompression;  Surgeon: Marybelle Killings, MD;  Location: [facility];  Service: Orthopedics;  Laterality: N/A;  . [facility] for ovarian cancer    OB History    No data available       Home Medications    Prior to Admission medications   Medication Sig Start Date End Date Taking? Authorizing Provider  allopurinol (ZYLOPRIM) 300 MG tablet Take 1 tablet (300 mg total) by mouth daily. 02/28/17  Yes Bartholomew Crews, MD  amLODipine  (NORVASC) 10 MG tablet Take 1 tablet (10 mg total) by mouth daily. 02/28/17  Yes Bartholomew Crews, MD  ascorbic acid (VITAMIN C) 500 MG tablet Take 500 mg by mouth daily.   Yes [provider]  atorvastatin (LIPITOR) 40 MG tablet Take 1 tablet (40 mg total) by mouth daily. 02/28/17  Yes Bartholomew Crews, MD  buPROPion (WELLBUTRIN XL) 300 MG 24 hr tablet Take 1 tablet (300 mg total) by mouth every morning. 02/27/17  Yes Arfeen, Arlyce Harman, MD  fluocinonide cream (LIDEX) 0.05 % APPLY TO AFFECTED AREA TWICE DAILY 11/02/16  Yes Bartholomew Crews, MD  fluticasone [facility]) 50 MCG/ACT nasal spray INSTILL 2 SPRAYS IN EACH NOSTRIL DAILY 02/28/17  Yes Bartholomew Crews, MD  furosemide (LASIX) 20 MG tablet TAKE ONE TABLET BY MOUTH DAILY. MAY TAKE AN EXTRA ONE AS NEEDED ONCE DAILY IF SWELLING OR EDEMA IS BAD 02/28/17  Yes Bartholomew Crews, MD  HYDROcodone-acetaminophen (NORCO/VICODIN) 5-325 MG tablet Take 1-2 tablets by mouth every 6 (six) hours as needed for moderate pain. 02/15/17  Yes Pool, Mallie Mussel, MD  hydrOXYzine (VISTARIL) 25 MG capsule Take 1 capsule (25 mg total) by mouth 2 (two) times daily. Patient taking differently: Take 25 mg by mouth at bedtime.  02/27/17  Yes Arfeen, Arlyce Harman, MD  losartan (COZAAR) 100 MG tablet Take 1 tablet (100 mg total) by mouth daily. 02/28/17  Yes Bartholomew Crews, MD  Multiple Vitamins-Minerals (ALIVE WOMENS ENERGY) TABS Take 1 tablet by mouth daily.    Yes [provider]  nystatin (MYCOSTATIN/NYSTOP) powder APPLY TO AFFECTED AREA TWICE DAILY 02/28/17  Yes Bartholomew Crews, MD  OVER THE COUNTER MEDICATION Calciboost capsule: Take 1 capsule by mouth once a day   Yes [provider]  oxybutynin (DITROPAN-XL) 5 MG 24 hr tablet Take 1 tablet (5 mg total) by mouth at bedtime. 02/28/17  Yes Bartholomew Crews, MD  pantoprazole (PROTONIX) 40 MG tablet TAKE 1 TABLET BY MOUTH TWICE DAILY BEFORE A MEAL 02/28/17  Yes Bartholomew Crews, MD  potassium  chloride (MICRO-K) 10 MEQ CR capsule Take 1 capsule (10 mEq total) by mouth daily. 02/28/17  Yes Bartholomew Crews, MD  traMADol (ULTRAM) 50 MG tablet TAKE 1 TABLET BY MOUTH TWICE DAILY AS NEEDED Patient taking differently: Take 50 mg by mouth two times a day as needed for pain 01/29/17  Yes Bartholomew Crews, MD  docusate sodium (COLACE) 100 MG capsule Take 1 capsule (100 mg total) by mouth every 12 (twelve) hours. Patient not taking: Reported on 03/06/2017 12/21/13   Elnora Morrison, MD    Family History Family History  Problem Relation Age of Onset  . Diabetes Mother   . Heart disease Mother   . Hearing loss Mother   . Stroke Mother   . Post-traumatic stress disorder Father   . Schizophrenia Maternal Grandmother   . Diabetes Maternal Grandfather   . Other Brother     brother died in mental  health hospital    Social History Social History  Substance Use Topics  . Smoking status: Former Smoker    Packs/day: 0.10    Types: Cigarettes    Quit date: 04/08/2013  . Smokeless tobacco: Never Used  . Alcohol use No     Allergies   Aspirin and Sulfamethoxazole-trimethoprim   Review of Systems Review of Systems  Constitutional: Negative for fever.  HENT: Negative for sore throat.   Eyes: Negative for visual disturbance.  Respiratory: Negative for cough and shortness of breath.   Cardiovascular: Negative for chest pain.  Gastrointestinal: Negative for abdominal pain, nausea and vomiting.  Genitourinary: Negative for difficulty urinating.  Musculoskeletal: Negative for back pain and neck pain.  Skin: Negative for rash.  Neurological: Positive for weakness (unchnaged from baseline) and headaches. Negative for dizziness, syncope, facial asymmetry and numbness.     Physical Exam Updated Vital Signs BP 135/76   Pulse 84   Temp 98.2 F (36.8 C) (Oral)   Resp 19   SpO2 100%   Physical Exam  Constitutional: She is oriented to person, place, and time. She appears  well-developed and well-nourished. No distress.  HENT:  Head: Normocephalic.  Mouth/Throat: Oropharynx is clear and moist. No oropharyngeal exudate.  Abrasion under front braid, bleeding hemostatic  Eyes: Conjunctivae and EOM are normal. Pupils are equal, round, and reactive to light.  Left pupil with chronic shape abnormality  Neck: Normal range of motion.  Cardiovascular: Normal rate, regular rhythm, normal heart sounds and intact distal pulses.  Exam reveals no gallop and no friction rub.   No murmur heard. Pulmonary/Chest: Effort normal and breath sounds normal. No respiratory distress. She has no wheezes. She has no rales.  Abdominal: Soft. She exhibits no distension. There is no tenderness. There is no guarding.  Musculoskeletal: She exhibits no edema or tenderness.  Neurological: She is alert and oriented to person, place, and time. No cranial nerve deficit or sensory deficit. Coordination and gait normal. GCS eye subscore is 4. GCS verbal subscore is 5. GCS motor subscore is 6.  Bilateral lower extremity weakness chronic per pt since operation no change   Skin: Skin is warm and dry. No rash noted. She is not diaphoretic. No erythema.  Nursing note and vitals reviewed.    ED Treatments / Results  Labs (all labs ordered are listed, but only abnormal results are displayed) Labs Reviewed  CBC WITH DIFFERENTIAL/PLATELET - Abnormal; Notable for the following:       Result Value   RBC 3.70 (*)    Hemoglobin 11.2 (*)    HCT 34.8 (*)    All other components within normal limits  COMPREHENSIVE METABOLIC PANEL - Abnormal; Notable for the following:    CO2 21 (*)    Glucose, Bld 103 (*)    Creatinine, Ser 1.18 (*)    GFR calc non Af Amer 44 (*)    GFR calc Af Amer 51 (*)    All other components within normal limits    EKG  EKG Interpretation None       Radiology Ct Head Wo Contrast  Result Date: 03/06/2017 CLINICAL DATA:  Fall today.  Recent bilateral subdural hematomas  EXAM: CT HEAD WITHOUT CONTRAST TECHNIQUE: Contiguous axial images were obtained from the base of the skull through the vertex without intravenous contrast. COMPARISON:  CT head without contrast 03/01/2017 FINDINGS: Brain: The left extra-axial collection has increased. Acute blood products are noted. Mixed density collections are present suggesting acute on chronic hemorrhage.  The collection measures 18 mm maximally on the coronal images. There is further extension of the left temporal lobe. Previously noted leftward midline shift has changed. There is now 4 mm of left to right midline shift. The mixed attenuation right subdural collection is stable. No acute cortical infarct is present. The ventricles are compressed bilaterally. There is no hydrocephalus. Brainstem and cerebellum are normal. Vascular: Atherosclerotic calcifications are present within the cavernous internal carotid arteries bilaterally. There is no hyperdense vessel. Skull: Bilateral frontal burr holes are present. Sinuses/Orbits: The paranasal sinuses and mastoid air cells are clear. The globes and orbits are within normal limits. IMPRESSION: 1. Increased size of left extra-axial collection compatible with acute on chronic subdural hematoma. 2. Increased left-sided mass effect with now 4 mm of left to right midline shift. 3. Stable appearance of mixed attenuation right subdural hematoma. These results were called by telephone at the time of interpretation on 03/06/2017 at 1:06 pm to Dr. Gareth Morgan , who verbally acknowledged these results. Electronically Signed   By: San Morelle M.D.   On: 03/06/2017 13:08   Dg Hip Unilat With Pelvis 2-3 Views Left  Result Date: 03/06/2017 CLINICAL DATA:  Left hip pain after fall at home today. Initial encounter. EXAM: DG HIP (WITH OR WITHOUT PELVIS) 2-3V LEFT COMPARISON:  None. FINDINGS: Bilateral total hip arthroplasty. No evidence of fracture or loosening. No dislocation. The pelvic ring appears  intact. Osteopenia and atherosclerosis. IMPRESSION: No acute finding. Electronically Signed   By: Monte Fantasia M.D.   On: 03/06/2017 15:09    Procedures Procedures (including critical care time)  Medications Ordered in ED Medications  sodium chloride flush (NS) 0.9 % injection 3 mL (not administered)  sodium chloride flush (NS) 0.9 % injection 3 mL (not administered)  0.9 %  sodium chloride infusion (not administered)  0.9 %  sodium chloride infusion (not administered)  acetaminophen (TYLENOL) tablet 650 mg (not administered)    Or  acetaminophen (TYLENOL) suppository 650 mg (not administered)  HYDROcodone-acetaminophen (NORCO/VICODIN) 5-325 MG per tablet 1-2 tablet (not administered)  zolpidem (AMBIEN) tablet 5 mg (not administered)  docusate sodium (COLACE) capsule 100 mg (not administered)  polyethylene glycol (MIRALAX / GLYCOLAX) packet 17 g (not administered)  bisacodyl (DULCOLAX) suppository 10 mg (not administered)  magnesium citrate solution 1 Bottle (not administered)  ondansetron (ZOFRAN) tablet 4 mg (not administered)    Or  ondansetron (ZOFRAN) injection 4 mg (not administered)  hydrOXYzine (ATARAX/VISTARIL) tablet 25 mg (not administered)  buPROPion (WELLBUTRIN XL) 24 hr tablet 300 mg (300 mg Oral Given 03/06/17 1537)  pantoprazole (PROTONIX) EC tablet 20 mg (not administered)  oxybutynin (DITROPAN-XL) 24 hr tablet 5 mg (not administered)  losartan (COZAAR) tablet 100 mg (not administered)  furosemide (LASIX) tablet 20 mg (20 mg Oral Given 03/06/17 1537)  atorvastatin (LIPITOR) tablet 40 mg (not administered)  amLODipine (NORVASC) tablet 10 mg (10 mg Oral Given 03/06/17 1537)  allopurinol (ZYLOPRIM) tablet 300 mg (not administered)  traMADol (ULTRAM) tablet 50 mg (50 mg Oral Given 03/06/17 1525)  potassium chloride (K-DUR,KLOR-CON) CR tablet 10 mEq (10 mEq Oral Given 03/06/17 1536)   CRITICAL CARE: subdural Performed by: Alvino Chapel   Total critical care  time: 30 minutes  Critical care time was exclusive of separately billable procedures and treating other patients.  Critical care was necessary to treat or prevent imminent or life-threatening deterioration.  Critical care was time spent personally by me on the following activities: development of treatment plan with patient and/or surrogate  as well as nursing, discussions with consultants, evaluation of patient's response to treatment, examination of patient, obtaining history from patient or surrogate, ordering and performing treatments and interventions, ordering and review of laboratory studies, ordering and review of radiographic studies, pulse oximetry and re-evaluation of patient's condition.    Initial Impression / Assessment and Plan / ED Course  I have reviewed the triage vital signs and the nursing notes.  Pertinent labs & imaging results that were available during my care of the patient were reviewed by me and considered in my medical decision making (see chart for details).     76 year old female with a history of bilateral subdural hematomas with bilateral bur hole evacuation done with Dr. [name] in April, recent falls x5, presents with concern for fall and head trauma today. XR hip negative. Denies injuries other than head. Patient without acute change in her neurologic exam, not on anticoagulation. CT head shows acute on chronic subdural hematoma, with previous CT showing right to left midline shift, however CT today showing a 4 mm left to right midline shift. Consulted neurosurgery, and Dr. [name] will admit the patient.  Final Clinical Impressions(s) / ED Diagnoses   Final diagnoses:  Fall  Subdural hematoma [facility])    New Prescriptions Current Discharge Medication List       Gareth Morgan, MD 03/06/17 1759

## 2017-03-06 NOTE — ED Triage Notes (Signed)
Pt arrives from home via [facility] after experiencing a fall and hitting her head. Pt family member states she has had 5 falls since 02/27/17 after having brain surgery on 02/12/17. Daughter states she has been off balance since and has had several falls since and had an appt with Dr. Annette Stable to get into an inpatient rehab.

## 2017-03-07 ENCOUNTER — Other Ambulatory Visit: Payer: PPO

## 2017-03-07 DIAGNOSIS — N3281 Overactive bladder: Secondary | ICD-10-CM | POA: Diagnosis not present

## 2017-03-07 DIAGNOSIS — D62 Acute posthemorrhagic anemia: Secondary | ICD-10-CM | POA: Diagnosis not present

## 2017-03-07 DIAGNOSIS — W19XXXD Unspecified fall, subsequent encounter: Secondary | ICD-10-CM

## 2017-03-07 DIAGNOSIS — S065X0A Traumatic subdural hemorrhage without loss of consciousness, initial encounter: Secondary | ICD-10-CM | POA: Diagnosis not present

## 2017-03-07 DIAGNOSIS — Z8614 Personal history of Methicillin resistant Staphylococcus aureus infection: Secondary | ICD-10-CM | POA: Diagnosis not present

## 2017-03-07 DIAGNOSIS — N183 Chronic kidney disease, stage 3 (moderate): Secondary | ICD-10-CM

## 2017-03-07 DIAGNOSIS — Z8673 Personal history of transient ischemic attack (TIA), and cerebral infarction without residual deficits: Secondary | ICD-10-CM | POA: Diagnosis not present

## 2017-03-07 DIAGNOSIS — I10 Essential (primary) hypertension: Secondary | ICD-10-CM | POA: Diagnosis not present

## 2017-03-07 DIAGNOSIS — N189 Chronic kidney disease, unspecified: Secondary | ICD-10-CM | POA: Diagnosis not present

## 2017-03-07 DIAGNOSIS — S065X1S Traumatic subdural hemorrhage with loss of consciousness of 30 minutes or less, sequela: Secondary | ICD-10-CM | POA: Diagnosis not present

## 2017-03-07 DIAGNOSIS — K5900 Constipation, unspecified: Secondary | ICD-10-CM | POA: Diagnosis not present

## 2017-03-07 DIAGNOSIS — D72829 Elevated white blood cell count, unspecified: Secondary | ICD-10-CM | POA: Diagnosis not present

## 2017-03-07 DIAGNOSIS — W0110XA Fall on same level from slipping, tripping and stumbling with subsequent striking against unspecified object, initial encounter: Secondary | ICD-10-CM | POA: Diagnosis not present

## 2017-03-07 DIAGNOSIS — Z79899 Other long term (current) drug therapy: Secondary | ICD-10-CM | POA: Diagnosis not present

## 2017-03-07 DIAGNOSIS — T380X5A Adverse effect of glucocorticoids and synthetic analogues, initial encounter: Secondary | ICD-10-CM | POA: Diagnosis not present

## 2017-03-07 DIAGNOSIS — M5137 Other intervertebral disc degeneration, lumbosacral region: Secondary | ICD-10-CM | POA: Diagnosis not present

## 2017-03-07 DIAGNOSIS — Z886 Allergy status to analgesic agent status: Secondary | ICD-10-CM | POA: Diagnosis not present

## 2017-03-07 DIAGNOSIS — D72828 Other elevated white blood cell count: Secondary | ICD-10-CM | POA: Diagnosis not present

## 2017-03-07 DIAGNOSIS — S069X0D Unspecified intracranial injury without loss of consciousness, subsequent encounter: Secondary | ICD-10-CM

## 2017-03-07 DIAGNOSIS — I6200 Nontraumatic subdural hemorrhage, unspecified: Secondary | ICD-10-CM | POA: Diagnosis not present

## 2017-03-07 DIAGNOSIS — I872 Venous insufficiency (chronic) (peripheral): Secondary | ICD-10-CM | POA: Diagnosis not present

## 2017-03-07 DIAGNOSIS — M1610 Unilateral primary osteoarthritis, unspecified hip: Secondary | ICD-10-CM | POA: Diagnosis not present

## 2017-03-07 DIAGNOSIS — I129 Hypertensive chronic kidney disease with stage 1 through stage 4 chronic kidney disease, or unspecified chronic kidney disease: Secondary | ICD-10-CM | POA: Diagnosis not present

## 2017-03-07 DIAGNOSIS — H903 Sensorineural hearing loss, bilateral: Secondary | ICD-10-CM | POA: Diagnosis not present

## 2017-03-07 DIAGNOSIS — Y92000 Kitchen of unspecified non-institutional (private) residence as  the place of occurrence of the external cause: Secondary | ICD-10-CM | POA: Diagnosis not present

## 2017-03-07 DIAGNOSIS — R7989 Other specified abnormal findings of blood chemistry: Secondary | ICD-10-CM | POA: Diagnosis not present

## 2017-03-07 DIAGNOSIS — R030 Elevated blood-pressure reading, without diagnosis of hypertension: Secondary | ICD-10-CM

## 2017-03-07 DIAGNOSIS — N179 Acute kidney failure, unspecified: Secondary | ICD-10-CM | POA: Diagnosis not present

## 2017-03-07 DIAGNOSIS — R269 Unspecified abnormalities of gait and mobility: Secondary | ICD-10-CM | POA: Diagnosis not present

## 2017-03-07 DIAGNOSIS — M109 Gout, unspecified: Secondary | ICD-10-CM | POA: Diagnosis not present

## 2017-03-07 DIAGNOSIS — G4700 Insomnia, unspecified: Secondary | ICD-10-CM | POA: Diagnosis not present

## 2017-03-07 DIAGNOSIS — E785 Hyperlipidemia, unspecified: Secondary | ICD-10-CM | POA: Diagnosis not present

## 2017-03-07 DIAGNOSIS — S065X9D Traumatic subdural hemorrhage with loss of consciousness of unspecified duration, subsequent encounter: Secondary | ICD-10-CM | POA: Diagnosis not present

## 2017-03-07 DIAGNOSIS — I341 Nonrheumatic mitral (valve) prolapse: Secondary | ICD-10-CM | POA: Diagnosis not present

## 2017-03-07 DIAGNOSIS — Z882 Allergy status to sulfonamides status: Secondary | ICD-10-CM | POA: Diagnosis not present

## 2017-03-07 DIAGNOSIS — R296 Repeated falls: Secondary | ICD-10-CM | POA: Diagnosis not present

## 2017-03-07 DIAGNOSIS — M1990 Unspecified osteoarthritis, unspecified site: Secondary | ICD-10-CM | POA: Diagnosis present

## 2017-03-07 DIAGNOSIS — R739 Hyperglycemia, unspecified: Secondary | ICD-10-CM

## 2017-03-07 DIAGNOSIS — Z9889 Other specified postprocedural states: Secondary | ICD-10-CM | POA: Diagnosis not present

## 2017-03-07 DIAGNOSIS — S065X9A Traumatic subdural hemorrhage with loss of consciousness of unspecified duration, initial encounter: Secondary | ICD-10-CM | POA: Diagnosis not present

## 2017-03-07 DIAGNOSIS — R413 Other amnesia: Secondary | ICD-10-CM | POA: Diagnosis not present

## 2017-03-07 DIAGNOSIS — F329 Major depressive disorder, single episode, unspecified: Secondary | ICD-10-CM | POA: Diagnosis not present

## 2017-03-07 DIAGNOSIS — N390 Urinary tract infection, site not specified: Secondary | ICD-10-CM | POA: Diagnosis not present

## 2017-03-07 DIAGNOSIS — Z8541 Personal history of malignant neoplasm of cervix uteri: Secondary | ICD-10-CM | POA: Diagnosis not present

## 2017-03-07 DIAGNOSIS — Z7951 Long term (current) use of inhaled steroids: Secondary | ICD-10-CM | POA: Diagnosis not present

## 2017-03-07 DIAGNOSIS — Y93G3 Activity, cooking and baking: Secondary | ICD-10-CM | POA: Diagnosis not present

## 2017-03-07 DIAGNOSIS — Z96643 Presence of artificial hip joint, bilateral: Secondary | ICD-10-CM

## 2017-03-07 DIAGNOSIS — S069XAA Unspecified intracranial injury with loss of consciousness status unknown, initial encounter: Secondary | ICD-10-CM

## 2017-03-07 DIAGNOSIS — S069X9A Unspecified intracranial injury with loss of consciousness of unspecified duration, initial encounter: Secondary | ICD-10-CM

## 2017-03-07 DIAGNOSIS — S065X0D Traumatic subdural hemorrhage without loss of consciousness, subsequent encounter: Secondary | ICD-10-CM | POA: Diagnosis not present

## 2017-03-07 DIAGNOSIS — A499 Bacterial infection, unspecified: Secondary | ICD-10-CM | POA: Diagnosis not present

## 2017-03-07 DIAGNOSIS — Z8543 Personal history of malignant neoplasm of ovary: Secondary | ICD-10-CM | POA: Diagnosis not present

## 2017-03-07 DIAGNOSIS — Z87891 Personal history of nicotine dependence: Secondary | ICD-10-CM | POA: Diagnosis not present

## 2017-03-07 DIAGNOSIS — T380X5D Adverse effect of glucocorticoids and synthetic analogues, subsequent encounter: Secondary | ICD-10-CM | POA: Diagnosis not present

## 2017-03-07 DIAGNOSIS — K219 Gastro-esophageal reflux disease without esophagitis: Secondary | ICD-10-CM | POA: Diagnosis not present

## 2017-03-07 DIAGNOSIS — B9620 Unspecified Escherichia coli [E. coli] as the cause of diseases classified elsewhere: Secondary | ICD-10-CM | POA: Diagnosis not present

## 2017-03-07 LAB — GLUCOSE, CAPILLARY: Glucose-Capillary: 143 mg/dL — ABNORMAL HIGH (ref 65–99)

## 2017-03-07 MED ORDER — DEXAMETHASONE 4 MG PO TABS
4.0000 mg | ORAL_TABLET | Freq: Four times a day (QID) | ORAL | Status: DC
  Administered 2017-03-07 – 2017-03-08 (×5): 4 mg via ORAL
  Filled 2017-03-07 (×7): qty 1

## 2017-03-07 NOTE — Progress Notes (Signed)
Patient admitted with frequent falling. Patient unstable on her feet with attempts at ambulation. No history of discrete weakness or sensory loss. Some intermittent headache which she attributes to striking her head.  On examination she is awake and alert. She is oriented and reasonably appropriate. Her speech is fluent. Her motor and sensory function of her extremities are normal. Her wounds are well-healed. View the patient's recent follow-up CT scans of her head. These demonstrate some minimal residual subdural fluid on the right without mass effect. On the left side she has a small subacute to chronic fluid collection with minimal mass effect. There is no evidence of acute hemorrhage.  Patient was in a poor functional state prior to surgery with multiple falls and resultant bilateral subdural hematomas. She is undergone successful evacuation of these hematomas but has some mild to moderate residual/recurrent subdural on the left side. I do not feel that is likely to improve her situation with further surgery. I think she is best served by further convalescence in a skilled nursing facility with extensive physical and occupational therapy. We will move forward toward that goal.

## 2017-03-07 NOTE — Evaluation (Signed)
Occupational Therapy Evaluation Patient Details Name: [name] MRN: [id number] DOB: May 03, 1941 Today's Date: 03/07/2017    History of Present Illness [name] is a 76 y.o. female with a past medical history significant for schizophrenia well-compensated, OSA not on CPAP, HTN, hx of TIA and gout was admitted following a fall. Pt with hx significant for multiple falls and daughter reports increased LE weakness. Pt s/p B burr holes in April 2018.    Clinical Impression   Pt has been experiencing increased LB weakness, difficulty with LB bathing and dressing and performing IADL and poor safety awareness. She has baseline memory deficits per daughter. Pt requires min assist for standing and basic transfers this visit. Recommending intensive therapy prior to return home with her supportive daughter. Will follow acutely.    Follow Up Recommendations  CIR;Supervision/Assistance - 24 hour    Equipment Recommendations  None recommended by OT    Recommendations for Other Services Rehab consult     Precautions / Restrictions Precautions Precautions: Fall      Mobility Bed Mobility               General bed mobility comments: pt received in the chair  Transfers Overall transfer level: Needs assistance Equipment used: Rolling walker (2 wheeled) Transfers: Sit to/from [facility] Sit to Stand: Min assist Stand pivot transfers: Min assist       General transfer comment: verbal cues for hand placement, min assist for balance, pt blocking knees on chair for support    Balance Overall balance assessment: Needs assistance   Sitting balance-Leahy Scale: Fair Sitting balance - Comments: unsafe access feet in sitting     Standing balance-Leahy Scale: Poor Standing balance comment: UE support needed for balance                           ADL either performed or assessed with clinical judgement   ADL Overall ADL's : Needs  assistance/impaired Eating/Feeding: Independent;Sitting   Grooming: Wash/dry hands;Wash/dry face;Sitting;Set up   Upper Body Bathing: Set up;Sitting   Lower Body Bathing: Maximal assistance;Sit to/from stand   Upper Body Dressing : Set up;Sitting   Lower Body Dressing: Maximal assistance;Sit to/from stand   Toilet Transfer: Minimal assistance;Stand-pivot;RW Armed forces technical officer Details (indicate cue type and reason): simulated to chair           General ADL Comments: Pt unable to cross her foot over opposite knee. Poor balance in standing for pulling up pants. Pt blocks knees on chair for support in standing.     Vision Baseline Vision/History: Wears glasses;Cataracts Wears Glasses: Reading only Patient Visual Report: No change from baseline       Perception     Praxis      Pertinent Vitals/Pain Pain Assessment: No/denies pain     Hand Dominance Left   Extremity/Trunk Assessment Upper Extremity Assessment Upper Extremity Assessment: Overall WFL for tasks assessed   Lower Extremity Assessment Lower Extremity Assessment: Defer to PT evaluation       Communication Communication Communication: HOH   Cognition Arousal/Alertness: Awake/alert Behavior During Therapy: WFL for tasks assessed/performed Overall Cognitive Status: Impaired/Different from baseline Area of Impairment: Memory;Safety/judgement;Following commands                     Memory: Decreased short-term memory Following Commands: Follows one step commands with increased time (and multimodal cues) Safety/Judgement: Decreased awareness of safety;Decreased awareness of deficits   Problem Solving: Slow  processing;Difficulty sequencing     General Comments       Exercises     Shoulder Instructions      Home Living Family/patient expects to be discharged to:: Private residence Living Arrangements: Children Available Help at Discharge: Family;Available 24 hours/day (daughter) Type of Home:  House Home Access: Ramped entrance;Stairs to enter Entrance Stairs-Number of Steps: 4 Entrance Stairs-Rails: Right Home Layout: One level     Bathroom Shower/Tub: Occupational psychologist: Standard     Home Equipment: Environmental consultant - 4 wheels;Cane - single point;Bedside commode;Walker - 2 wheels;Shower seat;Hand held shower head;Grab bars - tub/shower   Additional Comments: has an aide 5 days a week for 2 hours       Prior Functioning/Environment Level of Independence: Needs assistance  Gait / Transfers Assistance Needed: pt has been using RW since admission in April, daughter reports LB weakness increasing ADL's / Homemaking Assistance Needed: daughter reports pt having difficulty with LB bathing and dressing, has been assisted for IADL since April            OT Problem List: Decreased strength;Decreased activity tolerance;Impaired balance (sitting and/or standing);Decreased cognition;Decreased safety awareness;Decreased knowledge of use of DME or AE;Decreased knowledge of precautions;Pain      OT Treatment/Interventions: Self-care/ADL training;Energy conservation;DME and/or AE instruction;Therapeutic activities;Patient/family education;Balance training;Cognitive remediation/compensation    OT Goals(Current goals can be found in the care plan section) Acute Rehab OT Goals Patient Stated Goal: To return to independent OT Goal Formulation: With patient Time For Goal Achievement: 03/21/17 Potential to Achieve Goals: Good ADL Goals Pt Will Perform Grooming: with modified independence;standing Pt Will Perform Lower Body Bathing: with modified independence;sit to/from stand;with adaptive equipment Pt Will Perform Lower Body Dressing: with modified independence;with adaptive equipment;sit to/from stand Pt Will Transfer to Toilet: with modified independence;ambulating;bedside commode (over toilet) Pt Will Perform Toileting - Clothing Manipulation and hygiene: with modified  independence;sit to/from stand Pt Will Perform Tub/Shower Transfer: Shower transfer;with supervision;ambulating;shower seat;rolling walker Additional ADL Goal #1: Pt will identify and gather items necessary for ADL with RW modified independently.  OT Frequency: Min 2X/week   Barriers to D/C:            Co-evaluation              AM-PAC PT "6 Clicks" Daily Activity     Outcome Measure Help from another person eating meals?: None Help from another person taking care of personal grooming?: A Little Help from another person toileting, which includes using toliet, bedpan, or urinal?: A Lot Help from another person bathing (including washing, rinsing, drying)?: A Lot Help from another person to put on and taking off regular upper body clothing?: A Little Help from another person to put on and taking off regular lower body clothing?: A Lot 6 Click Score: 16   End of Session Equipment Utilized During Treatment: Gait belt;Rolling walker  Activity Tolerance: Patient tolerated treatment well Patient left: in chair;with call bell/phone within reach;with family/visitor present  OT Visit Diagnosis: Muscle weakness (generalized) (M62.81);Other symptoms and signs involving cognitive function;Repeated falls (R29.6);Unsteadiness on feet (R26.81);History of falling (Z91.81)                Time: 7564-3329 OT Time Calculation (min): 30 min Charges:  OT General Charges $OT Visit: 1 Procedure OT Evaluation $OT Eval Moderate Complexity: 1 Procedure OT Treatments $Self Care/Home Management : 8-22 mins G-Codes: OT G-codes **NOT FOR INPATIENT CLASS** Functional Assessment Tool Used: Clinical judgement Functional Limitation: Self care Self Care  Current Status [phone]): At least 40 percent but less than 60 percent impaired, limited or restricted Self Care Goal Status (W8677): At least 1 percent but less than 20 percent impaired, limited or restricted     Malka So 03/07/2017, 11:48 AM   [phone]

## 2017-03-07 NOTE — Consult Note (Signed)
Physical Medicine and Rehabilitation Consult Reason for Consult: Increased lower extremity weakness with multiple falls Referring Physician: Dr. Annette Stable   HPI: [name] is a 76 y.o. right handed female with history of bilateral hip replacement,  bilateral frontal burr hole for evacuation of subdural hematoma in April 16 by Dr.Pool after recent fall. She was discharged to home 02/15/2017 with her daughter at minimal assist level using a walker and home health therapies . Her daughter works during the day. Presented 03/06/2017 with multiple falls and reports of increasing lower extremity weakness. There has been no loss of consciousness. Cranial CT reviewed, showing left SDH.  Per report, increased size of left extra-axial collection compatible with acute on chronic subdural hematoma. Increased left sided mass effect with now a 4 mm of left-to-right midline shift. Neurosurgery follow-up currently on Dec[facility]ron protocol and conservative care. Occupational therapy evaluation completed with recommendations of physical medicine rehabilitation consult.   Review of Systems  Constitutional: Negative for chills and fever.  HENT: Negative for hearing loss.   Eyes: Negative for blurred vision and double vision.  Respiratory: Negative for cough and shortness of breath.   Cardiovascular: Negative for chest pain.  Gastrointestinal: Positive for constipation. Negative for nausea and vomiting.       GERD  Genitourinary: Negative for dysuria, flank pain and hematuria.  Musculoskeletal: Positive for falls, joint pain and myalgias.  Skin: Negative for rash.  Neurological: Positive for weakness. Negative for seizures.  Psychiatric/Behavioral: Positive for depression.  All other systems reviewed and are negative.  Past Medical History:  Diagnosis Date  . Chronic venous insufficiency   . Colon, diverticulosis Sept. 2011   outpatient colonoscopy by Dr. Cristina Gong.  Need record  . DDD (degenerative disc  disease), lumbosacral   . Depression   . DJD (degenerative joint disease), multiple sites    Low back pain worst  . GERD (gastroesophageal reflux disease)   . Gout   . Gout    Uric Acid level 4.2 on 300 allopurinol  . History of cervical cancer 1972  . Hx-TIA (transient ischemic attack) 05/13/2001   Right facial numbness  . Hyperlipidemia   . Hypertension   . Memory disorder 12/20/2016  . Mitral valve prolapse 10/27/1999   Subsequent 2D echo show normal mitral valve  . Osteoarthritis of hip    bilateral hips  . Ovarian cancer ([place]) 1972   S/P oophorectomy  . Psychosis   . Recurrent boils    History of MRSA skin infections with abscess  . Sensorineural hearing loss of both ears   . Subdural hematoma ([place]) 01/26/2017   bilateral   Past Surgical History:  Procedure Laterality Date  . ABDOMINAL HYSTERECTOMY     1972  . BURR HOLE Bilateral 02/12/2017   Procedure: Haskell Flirt;  Surgeon: Earnie Larsson, MD;  Location: [facility];  Service: Neurosurgery;  Laterality: Bilateral;  . CERVICAL DISCECTOMY  7/07   C5-C6  . JOINT REPLACEMENT     bilateral hip replacement  . LUMBAR DISC SURGERY     L5-S1 7/07  . LUMBAR LAMINECTOMY/DECOMPRESSION MICRODISCECTOMY N/A 02/02/2016   Procedure: L4-5 Decompression;  Surgeon: Marybelle Killings, MD;  Location: [facility];  Service: Orthopedics;  Laterality: N/A;  . [place] for ovarian cancer   Family History  Problem Relation Age of Onset  . Diabetes Mother   . Heart disease Mother   . Hearing loss Mother   . Stroke Mother   . Post-traumatic  stress disorder Father   . Schizophrenia Maternal Grandmother   . Diabetes Maternal Grandfather   . Other Brother     brother died in mental health hospital   Social History:  reports that she quit smoking about 3 years ago. Her smoking use included Cigarettes. She smoked 0.10 packs per day. She has never used smokeless tobacco. She reports that she does not drink alcohol or use drugs. Allergies:  Allergies    Allergen Reactions  . Aspirin Other (See Comments)    Bleeding from vagina   . Sulfamethoxazole-Trimethoprim Itching   Medications Prior to Admission  Medication Sig Dispense Refill  . allopurinol (ZYLOPRIM) 300 MG tablet Take 1 tablet (300 mg total) by mouth daily. 90 tablet 3  . amLODipine (NORVASC) 10 MG tablet Take 1 tablet (10 mg total) by mouth daily. 90 tablet 3  . ascorbic acid (VITAMIN C) 500 MG tablet Take 500 mg by mouth daily.    [name] atorvastatin (LIPITOR) 40 MG tablet Take 1 tablet (40 mg total) by mouth daily. 90 tablet 3  . buPROPion (WELLBUTRIN XL) 300 MG 24 hr tablet Take 1 tablet (300 mg total) by mouth every morning. 30 tablet 2  . fluocinonide cream (LIDEX) 0.05 % APPLY TO AFFECTED AREA TWICE DAILY 60 g 1  . fluticasone (FLONASE) 50 MCG/ACT nasal spray INSTILL 2 SPRAYS IN EACH NOSTRIL DAILY 16 g 4  . furosemide (LASIX) 20 MG tablet TAKE ONE TABLET BY MOUTH DAILY. MAY TAKE AN EXTRA ONE AS NEEDED ONCE DAILY IF SWELLING OR EDEMA IS BAD 45 tablet 11  . HYDROcodone-acetaminophen (NORCO/VICODIN) 5-325 MG tablet Take 1-2 tablets by mouth every 6 (six) hours as needed for moderate pain. 60 tablet 0  . hydrOXYzine (VISTARIL) 25 MG capsule Take 1 capsule (25 mg total) by mouth 2 (two) times daily. (Patient taking differently: Take 25 mg by mouth at bedtime. ) 60 capsule 2  . losartan (COZAAR) 100 MG tablet Take 1 tablet (100 mg total) by mouth daily. 30 tablet 11  . Multiple Vitamins-Minerals (ALIVE WOMENS ENERGY) TABS Take 1 tablet by mouth daily.     [name] nystatin (MYCOSTATIN/NYSTOP) powder APPLY TO AFFECTED AREA TWICE DAILY 60 g 11  . OVER THE COUNTER MEDICATION Calciboost capsule: Take 1 capsule by mouth once a day    . oxybutynin (DITROPAN-XL) 5 MG 24 hr tablet Take 1 tablet (5 mg total) by mouth at bedtime. 90 tablet 3  . pantoprazole (PROTONIX) 40 MG tablet TAKE 1 TABLET BY MOUTH TWICE DAILY BEFORE A MEAL 180 tablet 3  . potassium chloride (MICRO-K) 10 MEQ CR capsule Take 1  capsule (10 mEq total) by mouth daily. 90 capsule 3  . traMADol (ULTRAM) 50 MG tablet TAKE 1 TABLET BY MOUTH TWICE DAILY AS NEEDED (Patient taking differently: Take 50 mg by mouth two times a day as needed for pain) 60 tablet 0  . docusate sodium (COLACE) 100 MG capsule Take 1 capsule (100 mg total) by mouth every 12 (twelve) hours. (Patient not taking: Reported on 03/06/2017) 20 capsule 0    Home: Home Living Family/patient expects to be discharged to:: Private residence Living Arrangements: Children Available Help at Discharge: Family, Available 24 hours/day (daughter) Type of Home: House Home Access: Ramped entrance, Stairs to enter [employer] of Steps: 4 Entrance Stairs-Rails: Right [place]: One level Bathroom Shower/Tub: Multimedia programmer: [place]: Environmental consultant - 4 wheels, [employer] - single point, Bedside commode, Environmental consultant - 2 wheels, Shower seat, Hand held shower  head, Grab bars - tub/shower Additional Comments: has an aide 5 days a week for 2 hours   Functional History: Prior Function Level of Independence: Needs assistance Gait / Transfers Assistance Needed: pt has been using RW since admission in April, daughter reports LB weakness increasing ADL's / Homemaking Assistance Needed: daughter reports pt having difficulty with LB bathing and dressing, has been assisted for IADL since April Functional Status:  Mobility: Bed Mobility General bed mobility comments: pt received in the chair Transfers Overall transfer level: Needs assistance Equipment used: Rolling walker (2 wheeled) Transfers: Sit to/from Stand, [employer] Transfers Sit to Stand: Min assist Stand pivot transfers: Min assist General transfer comment: verbal cues for hand placement, min assist for balance, pt blocking knees on chair for support      ADL: ADL Overall ADL's : Needs assistance/impaired Eating/Feeding: Independent, Sitting Grooming: Wash/dry hands, Wash/dry face,  Sitting, Set up Upper Body Bathing: Set up, Sitting Lower Body Bathing: Maximal assistance, Sit to/from stand Upper Body Dressing : Set up, Sitting Lower Body Dressing: Maximal assistance, Sit to/from stand Toilet Transfer: Minimal assistance, Stand-pivot, RW Toilet Transfer Details (indicate cue type and reason): simulated to chair General ADL Comments: Pt unable to cross her foot over opposite knee. Poor balance in standing for pulling up pants. Pt blocks knees on chair for support in standing.  Cognition: Cognition Overall Cognitive Status: Impaired/Different from baseline Orientation Level: Oriented X4 Cognition Arousal/Alertness: Awake/alert Behavior During Therapy: WFL for tasks assessed/performed Overall Cognitive Status: Impaired/Different from baseline Area of Impairment: Memory, Safety/judgement, Following commands Memory: Decreased short-term memory Following Commands: Follows one step commands with increased time (and multimodal cues) Safety/Judgement: Decreased awareness of safety, Decreased awareness of deficits Problem Solving: Slow processing, Difficulty sequencing  Blood pressure 118/65, pulse 66, temperature 99.3 F (37.4 C), temperature source Oral, resp. rate 19, height 5\' 3"  (1.6 m), weight 90.2 kg (198 lb 13.7 oz), SpO2 98 %. Physical Exam  Vitals reviewed. Constitutional: She appears well-developed and well-nourished.  HENT:  Head: Normocephalic and atraumatic.  Eyes: Conjunctivae and EOM are normal.  Neck: Normal range of motion. Neck supple. No thyromegaly present.  Cardiovascular: Normal rate and regular rhythm.   Respiratory: Effort normal and breath sounds normal. No respiratory distress.  GI: Soft. Bowel sounds are normal. She exhibits no distension.  Musculoskeletal: She exhibits no edema or tenderness.  Neurological: She is alert.  She makes good eye contact with examiner and follows basic commands.  She provides her name age and date of  birth. Motor: B/l UE 5/5 proximal to distal B/l LE: 4/5 HF, KE, 5/5 ADF/PF  Skin: Skin is warm and dry.  Psychiatric: She has a normal mood and affect. Her behavior is normal.    Results for orders placed or performed during the hospital encounter of 03/06/17 (from the past 24 hour(s))  MRSA PCR Screening     Status: None   Collection Time: 03/06/17  6:21 PM  Result Value Ref Range   MRSA by PCR NEGATIVE NEGATIVE  Glucose, capillary     Status: Abnormal   Collection Time: 03/07/17  7:56 AM  Result Value Ref Range   Glucose-Capillary 143 (H) 65 - 99 mg/dL   Comment 1 Notify RN    Comment 2 Document in Chart    Ct Head Wo Contrast  Result Date: 03/06/2017 CLINICAL DATA:  Fall today.  Recent bilateral subdural hematomas EXAM: CT HEAD WITHOUT CONTRAST TECHNIQUE: Contiguous axial images were obtained from the base of the skull through  the vertex without intravenous contrast. COMPARISON:  CT head without contrast 03/01/2017 FINDINGS: Brain: The left extra-axial collection has increased. Acute blood products are noted. Mixed density collections are present suggesting acute on chronic hemorrhage. The collection measures 18 mm maximally on the coronal images. There is further extension of the left temporal lobe. Previously noted leftward midline shift has changed. There is now 4 mm of left to right midline shift. The mixed attenuation right subdural collection is stable. No acute cortical infarct is present. The ventricles are compressed bilaterally. There is no hydrocephalus. Brainstem and cerebellum are normal. Vascular: Atherosclerotic calcifications are present within the cavernous internal carotid arteries bilaterally. There is no hyperdense vessel. Skull: Bilateral frontal burr holes are present. Sinuses/Orbits: The paranasal sinuses and mastoid air cells are clear. The globes and orbits are within normal limits. IMPRESSION: 1. Increased size of left extra-axial collection compatible with acute on  chronic subdural hematoma. 2. Increased left-sided mass effect with now 4 mm of left to right midline shift. 3. Stable appearance of mixed attenuation right subdural hematoma. These results were called by telephone at the time of interpretation on 03/06/2017 at 1:06 pm to Dr. Gareth Morgan , who verbally acknowledged these results. Electronically Signed   By: San Morelle M.D.   On: 03/06/2017 13:08   Dg Hip Unilat With Pelvis 2-3 Views Left  Result Date: 03/06/2017 CLINICAL DATA:  Left hip pain after fall at home today. Initial encounter. EXAM: DG HIP (WITH OR WITHOUT PELVIS) 2-3V LEFT COMPARISON:  None. FINDINGS: Bilateral total hip arthroplasty. No evidence of fracture or loosening. No dislocation. The pelvic ring appears intact. Osteopenia and atherosclerosis. IMPRESSION: No acute finding. Electronically Signed   By: Monte Fantasia M.D.   On: 03/06/2017 15:09    Assessment/Plan: Diagnosis: Acute on chronic left SDH Labs and images independently reviewed.  Records reviewed and summated above.  [place] score:  >/VII  Cognitive therapy to direct modular abilities in order to maintain goals  including problem solving, self regulation/monitoring, self management, attention, and memory.  Fall precautions; pt at risk for second impact syndrome  Prevention of secondary injury: monitor for hypotension, hypoxia, seizures or signs of increased ICP  Avoid medications that could impair cognitive abilities, such as anticholinergics, antihistaminic, benzodiazapines, narcotics, etc when possible  1. Does the need for close, 24 hr/day medical supervision in concert with the patient's rehab needs make it unreasonable for this patient to be served in a less intensive setting? Potentially 2. Co-Morbidities requiring supervision/potential complications: bilateral hip replacement (monitor pain), bilateral frontal burr hole for evacuation of subdural hematoma, steroid induced hyperglycemia (Monitor  in accordance with exercise and adjust meds as necessary), elevated blood pressure (cont to monitor), ABLA (transfuse if necessary to ensure appropriate perfusion for increased activity tolerance), CKD (avoid nephrotoxic meds) 3. Due to safety and patient education, does the patient require 24 hr/day rehab nursing? Potentially 4. Does the patient require coordinated care of a physician, rehab nurse, PT (1-2 hrs/day, 5 days/week) and OT (1-2 hrs/day, 5 days/week) to address physical and functional deficits in the context of the above medical diagnosis(es)? Potentially Addressing deficits in the following areas: balance, endurance, locomotion, strength, transferring, toileting and psychosocial support 5. Can the patient actively participate in an intensive therapy program of at least 3 hrs of therapy per day at least 5 days per week? Yes 6. The potential for patient to make measurable gains while on inpatient rehab is good and fair 7. Anticipated functional outcomes upon discharge  from inpatient rehab are supervision and min assist  with PT, supervision and min assist with OT, supervision with SLP. 8. Estimated rehab length of stay to reach the above functional goals is: 7-10 days. 9. Does the patient have adequate social supports and living environment to accommodate these discharge functional goals? Potentially 10. Anticipated D/C setting: Home 11. Anticipated post D/C treatments: HH therapy and Home excercise program 12. Overall Rehab/Functional Prognosis: good  RECOMMENDATIONS: This patient's condition is appropriate for continued rehabilitative care in the following setting: Pt appears to be at/near baseline level of functioning.  If this is the case and family cannot care for her at discharge recommend SNF as pt will need supervision at discharge.   Patient has agreed to participate in recommended program. Potentially Note that insurance prior authorization may be required for reimbursement for  recommended care.  Comment: Rehab Admissions Coordinator to follow up.  Delice Lesch, MD, Mellody Drown Cathlyn Parsons., PA-C 03/07/2017

## 2017-03-07 NOTE — Progress Notes (Signed)
Physical medicine rehabilitation consult requested chart reviewed. Awaiting physical occupational therapy evaluations and follow-up at that time with appropriate plan of care

## 2017-03-07 NOTE — Care Management Note (Signed)
Case Management Note  Patient Details  Name: [name] MRN: [id number] Date of Birth: 18-Oct-1941  Subjective/Objective:   Patient admitted with frequent falls and SDH, unstable on feet with attempts at ambulation, intermittent ha from hitting her head. Per pt eval rec CIR, per CIR rec SNF, NCM informed [place] CSW.                  Action/Plan: NCM will follow for dc needs.  Expected Discharge Date:                  Expected Discharge Plan:     In-House Referral:     Discharge planning Services  CM Consult  Post Acute Care Choice:    Choice offered to:     DME Arranged:    DME Agency:     HH Arranged:    HH Agency:     Status of Service:  In process, will continue to follow  If discussed at Long Length of Stay Meetings, dates discussed:    Additional Comments:  Zenon Mayo, RN 03/07/2017, 3:59 PM

## 2017-03-07 NOTE — Evaluation (Signed)
Physical Therapy Evaluation Patient Details Name: [name] MRN: [id number] DOB: 1941-08-03 Today's Date: 03/07/2017   History of Present Illness  [name] is a 76 y.o. female with a past medical history significant for schizophrenia well-compensated, OSA not on CPAP, HTN, hx of TIA and gout was admitted following a fall. Pt with hx significant for multiple falls and daughter reports increased LE weakness. Pt s/p B burr holes in April 2018.   Clinical Impression  Pt admitted with/for increasing LE weakness and falls.  Pt presently needing min assist for general mobility and needing min assist for gait which has significantly degraded and unsafe.  Pt currently limited functionally due to the problems listed. ( See problems list.)   Pt will benefit from PT to maximize function and safety in order to get ready for next venue listed below.     Follow Up Recommendations CIR    Equipment Recommendations  None recommended by PT    Recommendations for Other Services Rehab consult     Precautions / Restrictions Precautions Precautions: Fall Restrictions Weight Bearing Restrictions: No      Mobility  Bed Mobility               General bed mobility comments: pt received in the chair  Transfers Overall transfer level: Needs assistance Equipment used: Rolling walker (2 wheeled) Transfers: Sit to/from Stand Sit to Stand: Min assist Stand pivot transfers: Min assist       General transfer comment: verbal cues for hand placement and setting up for standing, min assist for balance, pt blocking knees on chair for support  Ambulation/Gait Ambulation/Gait assistance: Min assist (with mod multimodal cues) Ambulation Distance (Feet): 8 Feet (x2) Assistive device: Rolling walker (2 wheeled) Gait Pattern/deviations: Step-to pattern;Step-through pattern;Decreased step length - right;Decreased step length - left;Decreased stance time - left;Decreased stride length Gait  velocity: slow Gait velocity interpretation: Below normal speed for age/gender General Gait Details: Very degraded gait pattern from previous admission--pt with very wide BOS, made 2-4 ince steps bilaterally with straight knees and rigid posture.  Started trying to break down pattern and transition to a more flexed knee, toe off pattern which was very difficult for pt.  Stairs            Wheelchair Mobility    Modified Rankin (Stroke Patients Only)       Balance Overall balance assessment: Needs assistance   Sitting balance-Leahy Scale: Fair     Standing balance support: Bilateral upper extremity supported Standing balance-Leahy Scale: Poor Standing balance comment: UE support needed for balance                             Pertinent Vitals/Pain Pain Assessment: No/denies pain    Home Living Family/patient expects to be discharged to:: Private residence Living Arrangements: Children Available Help at Discharge: Family;Available 24 hours/day (daughter) Type of Home: House Home Access: Ramped entrance;Stairs to enter Entrance Stairs-Rails: Right Entrance Stairs-Number of Steps: 4 Home Layout: One level Home Equipment: Walker - 4 wheels;Cane - single point;Bedside commode;Walker - 2 wheels;Shower seat;Hand held shower head;Grab bars - tub/shower Additional Comments: has an aide 5 days a week for 2 hours     Prior Function Level of Independence: Needs assistance   Gait / Transfers Assistance Needed: pt has been using RW since admission in April, daughter reports LB weakness increasing  ADL's / Homemaking Assistance Needed: daughter reports pt having difficulty with LB bathing  and dressing, has been assisted for IADL since April        Hand Dominance   Dominant Hand: Left    Extremity/Trunk Assessment   Upper Extremity Assessment Upper Extremity Assessment: Overall WFL for tasks assessed    Lower Extremity Assessment Lower Extremity Assessment:  RLE deficits/detail;LLE deficits/detail RLE Deficits / Details: strength--hip flex 2+/5, quads 3+, hams 3+, df/pf 3- ;  pt slow to follow sequencing needing multimodal cues.  Notably weaker than L LE RLE Coordination: decreased fine motor LLE Deficits / Details: strength:  hip flex 3/5, quads, 4-, hams, 4-, df/pf 3- LLE Coordination: decreased fine motor       Communication   Communication: HOH  Cognition Arousal/Alertness: Awake/alert Behavior During Therapy: WFL for tasks assessed/performed Overall Cognitive Status: Impaired/Different from baseline Area of Impairment: Memory;Safety/judgement;Following commands                     Memory: Decreased short-term memory Following Commands: Follows one step commands with increased time (and multimodal cues) Safety/Judgement: Decreased awareness of safety;Decreased awareness of deficits   Problem Solving: Slow processing;Difficulty sequencing        General Comments      Exercises     Assessment/Plan    PT Assessment Patient needs continued PT services  PT Problem List Decreased strength;Decreased activity tolerance;Decreased balance;Decreased mobility;Decreased coordination;Decreased knowledge of use of DME;Decreased safety awareness       PT Treatment Interventions DME instruction;Gait training;Functional mobility training;Therapeutic activities;Balance training;Neuromuscular re-education;Patient/family education    PT Goals (Current goals can be found in the Care Plan section)  Acute Rehab PT Goals Patient Stated Goal: To return to independent PT Goal Formulation: With patient/family Time For Goal Achievement: 03/21/17 Potential to Achieve Goals: Good    Frequency Min 4X/week   Barriers to discharge        Co-evaluation               AM-PAC PT "6 Clicks" Daily Activity  Outcome Measure Difficulty turning over in bed (including adjusting bedclothes, sheets and blankets)?: A Lot Difficulty moving  from lying on back to sitting on the side of the bed? : A Lot Difficulty sitting down on and standing up from a chair with arms (e.g., wheelchair, bedside commode, etc,.)?: Total Help needed moving to and from a bed to chair (including a wheelchair)?: A Little Help needed walking in hospital room?: A Little Help needed climbing 3-5 steps with a railing? : A Lot 6 Click Score: 13    End of Session   Activity Tolerance: Patient tolerated treatment well Patient left: in chair;with call bell/phone within reach;with chair alarm set;with family/visitor present Nurse Communication: Mobility status PT Visit Diagnosis: Unsteadiness on feet (R26.81);Other abnormalities of gait and mobility (R26.89);Muscle weakness (generalized) (M62.81);History of falling (Z91.81)    Time: 1350-1426 PT Time Calculation (min) (ACUTE ONLY): 36 min   Charges:   PT Evaluation $PT Eval Moderate Complexity: 1 Procedure PT Treatments $Gait Training: 8-22 mins   PT G Codes:   PT G-Codes **NOT FOR INPATIENT CLASS** Functional Assessment Tool Used: AM-PAC 6 Clicks Basic Mobility;Clinical judgement Functional Limitation: Mobility: Walking and moving around Mobility: Walking and Moving Around Current Status (R6045): At least 20 percent but less than 40 percent impaired, limited or restricted Mobility: Walking and Moving Around Goal Status [phone]): At least 1 percent but less than 20 percent impaired, limited or restricted      Burnard Bunting 03/07/2017, 2:46 PM

## 2017-03-07 NOTE — Progress Notes (Addendum)
Inpatient Rehabilitation  PMR consult has been completed by Dr. Posey Pronto.  His concerns with a potential CIR admission relate to pt. being at or near baseline level of functioning and with availability of  caregiver support.  I have spoken with pt's daughter [name] [phone]).  [name] states she has secured 24 hour supervision for her mother between herself and pt's niece [name].  Plan will be for [name] to further discuss with the rehab team in the am and provide update on feasibility regarding a potential CIR admission (which would require insurance authorization). Please call if questions.  [name] PT Inpatient Rehab Admissions Coordinator Cell [phone] Office [phone]   03/08/17 [phone]  ADDENDUM:     Daughter described her mother as functioning independently in her own home prior to her initial injury in April.  [name] stated that her mom would use a motorized scooter for longer distances but could manage her household, complete  her own ADLs, as well as get herself on the bus to run errands.  Daughter describes that her mom had never had a fall prior to the accident in April which was caused by a large dog knocking her over.    [name]

## 2017-03-08 ENCOUNTER — Encounter (HOSPITAL_COMMUNITY): Payer: Self-pay | Admitting: *Deleted

## 2017-03-08 ENCOUNTER — Inpatient Hospital Stay (HOSPITAL_COMMUNITY)
Admission: RE | Admit: 2017-03-08 | Discharge: 2017-03-18 | DRG: 949 | Disposition: A | Discharge status: Home / Self Care | Payer: PPO | Source: Intra-hospital | Attending: Physical Medicine & Rehabilitation | Admitting: Physical Medicine & Rehabilitation

## 2017-03-08 DIAGNOSIS — W19XXXD Unspecified fall, subsequent encounter: Secondary | ICD-10-CM | POA: Diagnosis present

## 2017-03-08 DIAGNOSIS — Z886 Allergy status to analgesic agent status: Secondary | ICD-10-CM

## 2017-03-08 DIAGNOSIS — Z96643 Presence of artificial hip joint, bilateral: Secondary | ICD-10-CM | POA: Diagnosis present

## 2017-03-08 DIAGNOSIS — N183 Chronic kidney disease, stage 3 unspecified: Secondary | ICD-10-CM | POA: Diagnosis present

## 2017-03-08 DIAGNOSIS — G4700 Insomnia, unspecified: Secondary | ICD-10-CM | POA: Diagnosis present

## 2017-03-08 DIAGNOSIS — N3281 Overactive bladder: Secondary | ICD-10-CM | POA: Diagnosis present

## 2017-03-08 DIAGNOSIS — R7989 Other specified abnormal findings of blood chemistry: Secondary | ICD-10-CM | POA: Diagnosis not present

## 2017-03-08 DIAGNOSIS — N390 Urinary tract infection, site not specified: Secondary | ICD-10-CM | POA: Diagnosis not present

## 2017-03-08 DIAGNOSIS — Z87891 Personal history of nicotine dependence: Secondary | ICD-10-CM | POA: Diagnosis not present

## 2017-03-08 DIAGNOSIS — R296 Repeated falls: Secondary | ICD-10-CM | POA: Diagnosis present

## 2017-03-08 DIAGNOSIS — F209 Schizophrenia, unspecified: Secondary | ICD-10-CM | POA: Diagnosis present

## 2017-03-08 DIAGNOSIS — Z8541 Personal history of malignant neoplasm of cervix uteri: Secondary | ICD-10-CM | POA: Diagnosis not present

## 2017-03-08 DIAGNOSIS — D62 Acute posthemorrhagic anemia: Secondary | ICD-10-CM | POA: Diagnosis present

## 2017-03-08 DIAGNOSIS — N179 Acute kidney failure, unspecified: Secondary | ICD-10-CM | POA: Diagnosis present

## 2017-03-08 DIAGNOSIS — F419 Anxiety disorder, unspecified: Secondary | ICD-10-CM | POA: Diagnosis present

## 2017-03-08 DIAGNOSIS — Z8673 Personal history of transient ischemic attack (TIA), and cerebral infarction without residual deficits: Secondary | ICD-10-CM | POA: Diagnosis not present

## 2017-03-08 DIAGNOSIS — K5900 Constipation, unspecified: Secondary | ICD-10-CM | POA: Diagnosis present

## 2017-03-08 DIAGNOSIS — F329 Major depressive disorder, single episode, unspecified: Secondary | ICD-10-CM | POA: Diagnosis present

## 2017-03-08 DIAGNOSIS — Z7951 Long term (current) use of inhaled steroids: Secondary | ICD-10-CM | POA: Diagnosis not present

## 2017-03-08 DIAGNOSIS — D72829 Elevated white blood cell count, unspecified: Secondary | ICD-10-CM | POA: Diagnosis present

## 2017-03-08 DIAGNOSIS — S065X9D Traumatic subdural hemorrhage with loss of consciousness of unspecified duration, subsequent encounter: Secondary | ICD-10-CM | POA: Diagnosis not present

## 2017-03-08 DIAGNOSIS — N189 Chronic kidney disease, unspecified: Secondary | ICD-10-CM | POA: Diagnosis present

## 2017-03-08 DIAGNOSIS — M109 Gout, unspecified: Secondary | ICD-10-CM | POA: Diagnosis present

## 2017-03-08 DIAGNOSIS — D72828 Other elevated white blood cell count: Secondary | ICD-10-CM | POA: Diagnosis not present

## 2017-03-08 DIAGNOSIS — F200 Paranoid schizophrenia: Secondary | ICD-10-CM

## 2017-03-08 DIAGNOSIS — M545 Low back pain, unspecified: Secondary | ICD-10-CM

## 2017-03-08 DIAGNOSIS — Z90710 Acquired absence of both cervix and uterus: Secondary | ICD-10-CM

## 2017-03-08 DIAGNOSIS — M1612 Unilateral primary osteoarthritis, left hip: Secondary | ICD-10-CM | POA: Diagnosis present

## 2017-03-08 DIAGNOSIS — I10 Essential (primary) hypertension: Secondary | ICD-10-CM | POA: Diagnosis not present

## 2017-03-08 DIAGNOSIS — Z79899 Other long term (current) drug therapy: Secondary | ICD-10-CM

## 2017-03-08 DIAGNOSIS — E785 Hyperlipidemia, unspecified: Secondary | ICD-10-CM | POA: Diagnosis present

## 2017-03-08 DIAGNOSIS — Z8614 Personal history of Methicillin resistant Staphylococcus aureus infection: Secondary | ICD-10-CM

## 2017-03-08 DIAGNOSIS — M1611 Unilateral primary osteoarthritis, right hip: Secondary | ICD-10-CM | POA: Diagnosis present

## 2017-03-08 DIAGNOSIS — Z8543 Personal history of malignant neoplasm of ovary: Secondary | ICD-10-CM

## 2017-03-08 DIAGNOSIS — T380X5D Adverse effect of glucocorticoids and synthetic analogues, subsequent encounter: Secondary | ICD-10-CM

## 2017-03-08 DIAGNOSIS — Z882 Allergy status to sulfonamides status: Secondary | ICD-10-CM

## 2017-03-08 DIAGNOSIS — R269 Unspecified abnormalities of gait and mobility: Secondary | ICD-10-CM

## 2017-03-08 DIAGNOSIS — K219 Gastro-esophageal reflux disease without esophagitis: Secondary | ICD-10-CM | POA: Diagnosis present

## 2017-03-08 DIAGNOSIS — S065X1S Traumatic subdural hemorrhage with loss of consciousness of 30 minutes or less, sequela: Secondary | ICD-10-CM | POA: Diagnosis not present

## 2017-03-08 DIAGNOSIS — B9620 Unspecified Escherichia coli [E. coli] as the cause of diseases classified elsewhere: Secondary | ICD-10-CM | POA: Diagnosis present

## 2017-03-08 DIAGNOSIS — A499 Bacterial infection, unspecified: Secondary | ICD-10-CM | POA: Diagnosis not present

## 2017-03-08 DIAGNOSIS — S065X0A Traumatic subdural hemorrhage without loss of consciousness, initial encounter: Secondary | ICD-10-CM

## 2017-03-08 DIAGNOSIS — S065X0D Traumatic subdural hemorrhage without loss of consciousness, subsequent encounter: Secondary | ICD-10-CM | POA: Diagnosis present

## 2017-03-08 DIAGNOSIS — I129 Hypertensive chronic kidney disease with stage 1 through stage 4 chronic kidney disease, or unspecified chronic kidney disease: Secondary | ICD-10-CM | POA: Diagnosis present

## 2017-03-08 LAB — GLUCOSE, CAPILLARY: Glucose-Capillary: 171 mg/dL — ABNORMAL HIGH (ref 65–99)

## 2017-03-08 MED ORDER — ACETAMINOPHEN 650 MG RE SUPP: 650.0000 mg | Freq: Four times a day (QID) | RECTAL | Status: DC | PRN

## 2017-03-08 MED ORDER — DOCUSATE SODIUM 100 MG PO CAPS
100.0000 mg | ORAL_CAPSULE | Freq: Two times a day (BID) | ORAL | Status: DC
  Administered 2017-03-08 – 2017-03-18 (×20): 100 mg via ORAL
  Filled 2017-03-08 (×20): qty 1

## 2017-03-08 MED ORDER — ACETAMINOPHEN 325 MG PO TABS: 650.0000 mg | ORAL_TABLET | Freq: Four times a day (QID) | ORAL | Status: DC | PRN

## 2017-03-08 MED ORDER — LOSARTAN POTASSIUM 50 MG PO TABS
100.0000 mg | ORAL_TABLET | Freq: Every day | ORAL | Status: DC
  Administered 2017-03-09 – 2017-03-18 (×10): 100 mg via ORAL
  Filled 2017-03-08 (×10): qty 2

## 2017-03-08 MED ORDER — HYDROXYZINE HCL 25 MG PO TABS
25.0000 mg | ORAL_TABLET | Freq: Every day | ORAL | Status: DC
  Administered 2017-03-08 – 2017-03-17 (×10): 25 mg via ORAL
  Filled 2017-03-08 (×10): qty 1

## 2017-03-08 MED ORDER — ONDANSETRON HCL 4 MG PO TABS: 4.0000 mg | ORAL_TABLET | Freq: Four times a day (QID) | ORAL | Status: DC | PRN

## 2017-03-08 MED ORDER — DEXAMETHASONE 4 MG PO TABS
4.0000 mg | ORAL_TABLET | Freq: Four times a day (QID) | ORAL | Status: DC
  Administered 2017-03-09 – 2017-03-10 (×5): 4 mg via ORAL
  Filled 2017-03-08 (×5): qty 1

## 2017-03-08 MED ORDER — TRAMADOL HCL 50 MG PO TABS
50.0000 mg | ORAL_TABLET | Freq: Two times a day (BID) | ORAL | Status: DC | PRN
  Administered 2017-03-10 – 2017-03-17 (×4): 50 mg via ORAL
  Filled 2017-03-08 (×4): qty 1

## 2017-03-08 MED ORDER — PANTOPRAZOLE SODIUM 20 MG PO TBEC
20.0000 mg | DELAYED_RELEASE_TABLET | Freq: Two times a day (BID) | ORAL | Status: DC
  Administered 2017-03-08 – 2017-03-18 (×20): 20 mg via ORAL
  Filled 2017-03-08 (×20): qty 1

## 2017-03-08 MED ORDER — ONDANSETRON HCL 4 MG/2ML IJ SOLN: 4.0000 mg | Freq: Four times a day (QID) | INTRAMUSCULAR | Status: DC | PRN

## 2017-03-08 MED ORDER — ATORVASTATIN CALCIUM 40 MG PO TABS
40.0000 mg | ORAL_TABLET | Freq: Every day | ORAL | Status: DC
  Administered 2017-03-09 – 2017-03-18 (×10): 40 mg via ORAL
  Filled 2017-03-08 (×10): qty 1

## 2017-03-08 MED ORDER — SORBITOL 70 % SOLN: 30.0000 mL | Freq: Every day | Status: DC | PRN

## 2017-03-08 MED ORDER — BUPROPION HCL ER (XL) 150 MG PO TB24
300.0000 mg | ORAL_TABLET | Freq: Every morning | ORAL | Status: DC
  Administered 2017-03-09 – 2017-03-18 (×10): 300 mg via ORAL
  Filled 2017-03-08 (×10): qty 2

## 2017-03-08 MED ORDER — POLYETHYLENE GLYCOL 3350 17 G PO PACK
17.0000 g | PACK | Freq: Every day | ORAL | Status: DC | PRN
Start: 2017-03-08 — End: 2017-03-18

## 2017-03-08 MED ORDER — AMLODIPINE BESYLATE 10 MG PO TABS
10.0000 mg | ORAL_TABLET | Freq: Every day | ORAL | Status: DC
  Administered 2017-03-10 – 2017-03-18 (×9): 10 mg via ORAL
  Filled 2017-03-08 (×10): qty 1

## 2017-03-08 MED ORDER — BISACODYL 10 MG RE SUPP: 10.0000 mg | Freq: Every day | RECTAL | Status: DC | PRN

## 2017-03-08 MED ORDER — OXYBUTYNIN CHLORIDE ER 5 MG PO TB24
5.0000 mg | ORAL_TABLET | Freq: Every day | ORAL | Status: DC
  Administered 2017-03-08 – 2017-03-17 (×10): 5 mg via ORAL
  Filled 2017-03-08 (×10): qty 1

## 2017-03-08 MED ORDER — FUROSEMIDE 20 MG PO TABS
20.0000 mg | ORAL_TABLET | Freq: Every day | ORAL | Status: DC
  Filled 2017-03-08: qty 1

## 2017-03-08 MED ORDER — ALLOPURINOL 100 MG PO TABS
300.0000 mg | ORAL_TABLET | Freq: Every day | ORAL | Status: DC
  Administered 2017-03-09 – 2017-03-18 (×10): 300 mg via ORAL
  Filled 2017-03-08 (×10): qty 3

## 2017-03-08 MED ORDER — POTASSIUM CHLORIDE CRYS ER 10 MEQ PO TBCR
10.0000 meq | EXTENDED_RELEASE_TABLET | Freq: Every day | ORAL | Status: DC
  Administered 2017-03-09 – 2017-03-18 (×10): 10 meq via ORAL
  Filled 2017-03-08 (×10): qty 1

## 2017-03-08 NOTE — Care Management Note (Signed)
Case Management Note  Patient Details  Name: [name] MRN: [id number] Date of Birth: 12-Sep-1941  Subjective/Objective:    Patient discharging to CIR today.                Action/Plan:   Expected Discharge Date:                  Expected Discharge Plan:  IP Rehab Facility  In-House Referral:  Clinical Social Work  Discharge planning Services  CM Consult  Post Acute Care Choice:    Choice offered to:     DME Arranged:    DME Agency:     HH Arranged:    [place] Agency:     Status of Service:  Completed, signed off  If discussed at [employer], dates discussed:    Additional Comments:  Zenon Mayo, RN 03/08/2017, 4:34 PM

## 2017-03-08 NOTE — Progress Notes (Signed)
Occupational Therapy Treatment Patient Details Name: [name] MRN: [id number] DOB: 1940-12-11 Today's Date: 03/08/2017    History of present illness [name] is a 76 y.o. female with a past medical history significant for schizophrenia well-compensated, OSA not on CPAP, HTN, hx of TIA and gout was admitted following a fall. Pt with hx significant for multiple falls and daughter reports increased LE weakness. Pt s/p B burr holes in April 2018.    OT comments  Pt required min assist overall today for toilet transfer with ambulation to bathroom, peri care, LB dressing, and grooming standing at the sink. Pt easily distracted with poor short term memory requiring max cues for sequencing and safety with activities. D/c plan remains appropriate. Will continue to follow acutely.   Follow Up Recommendations  CIR;Supervision/Assistance - 24 hour    Equipment Recommendations  None recommended by OT    Recommendations for Other Services      Precautions / Restrictions Precautions Precautions: Fall Restrictions Weight Bearing Restrictions: No       Mobility Bed Mobility               General bed mobility comments: Pt OOB in chair upon arrival.  Transfers Overall transfer level: Needs assistance Equipment used: Rolling walker (2 wheeled) Transfers: Sit to/from Stand Sit to Stand: Min assist         General transfer comment: Min assist for balance with cues throughout for sequencing and safety    Balance Overall balance assessment: Needs assistance Sitting-balance support: Feet supported;No upper extremity supported Sitting balance-Leahy Scale: Fair     Standing balance support: No upper extremity supported;During functional activity Standing balance-Leahy Scale: Fair                             ADL either performed or assessed with clinical judgement   ADL Overall ADL's : Needs assistance/impaired     Grooming: Minimal  assistance;Standing;Wash/dry hands               Lower Body Dressing: Minimal assistance Lower Body Dressing Details (indicate cue type and reason): min assist to don sock to R foot Toilet Transfer: Minimal assistance;Ambulation;Regular Toilet;RW;Cueing for safety;Cueing for sequencing Toilet Transfer Details (indicate cue type and reason): Max cues for sequencing and safety with RW Toileting- Clothing Manipulation and Hygiene: Minimal assistance;Sit to/from stand Toileting - Clothing Manipulation Details (indicate cue type and reason): Assist for balance     Functional mobility during ADLs: Minimal assistance;Rolling walker General ADL Comments: Needs max cues for attention to activity and sequencing/safety with functional activities.     Vision       Perception     Praxis      Cognition Arousal/Alertness: Awake/alert Behavior During Therapy: WFL for tasks assessed/performed Overall Cognitive Status: Impaired/Different from baseline Area of Impairment: Attention;Memory;Safety/judgement;Awareness;Problem solving                   Current Attention Level: Sustained Memory: Decreased short-term memory Following Commands: Follows one step commands inconsistently;Follows one step commands with increased time Safety/Judgement: Decreased awareness of safety;Decreased awareness of deficits Awareness: Emergent Problem Solving: Difficulty sequencing;Requires verbal cues;Requires tactile cues General Comments: Pt reports "I cant" frequently when asked to participate in functional activities. Needs cues for initiation and sequencing of BADL.        Exercises     Shoulder Instructions       General Comments      Pertinent  Vitals/ Pain       Pain Assessment: No/denies pain  Home Living                                          Prior Functioning/Environment              Frequency  Min 2X/week        Progress Toward Goals  OT  Goals(current goals can now be found in the care plan section)  Progress towards OT goals: Progressing toward goals  Acute Rehab OT Goals Patient Stated Goal: To return to independent OT Goal Formulation: With patient  Plan Discharge plan remains appropriate    Co-evaluation                 AM-PAC PT "6 Clicks" Daily Activity     Outcome Measure   Help from another person eating meals?: None Help from another person taking care of personal grooming?: A Little Help from another person toileting, which includes using toliet, bedpan, or urinal?: A Little Help from another person bathing (including washing, rinsing, drying)?: A Little Help from another person to put on and taking off regular upper body clothing?: A Little Help from another person to put on and taking off regular lower body clothing?: A Little 6 Click Score: 19    End of Session Equipment Utilized During Treatment: Gait belt;Rolling walker  OT Visit Diagnosis: Muscle weakness (generalized) (M62.81);Other symptoms and signs involving cognitive function;Repeated falls (R29.6);Unsteadiness on feet (R26.81);History of falling (Z91.81)   Activity Tolerance Patient tolerated treatment well   Patient Left in chair;with call bell/phone within reach;with nursing/sitter in room;with SCD's reapplied   Nurse Communication Mobility status        Time: 5631-4970 OT Time Calculation (min): 25 min  Charges: OT General Charges $OT Visit: 1 Procedure OT Treatments $Self Care/Home Management : 23-37 mins  [name] A. Ulice Brilliant, M.S., OTR/L Pager: [place] 03/08/2017, 3:32 PM

## 2017-03-08 NOTE — H&P (Signed)
Physical Medicine and Rehabilitation Admission H&P       Chief Complaint  Patient presents with  . Head Injury  : HPI: Shelby Bari McKinneyis a 76 y.o.right handed femalewith history ofbilateral hip replacement,bilateral frontal burr hole for evacuation of subdural hematoma in April 16 by Dr.Poolafter recent fall. She was discharged to home 02/15/2017 with her daughter at minimal assist level using a walkerand home health therapies .Her daughter works during the day and her niece plans to assist as needed .Presented 03/06/2017 with multiple falls and reports of increasing lower extremity weakness. There has been no loss of consciousness. Cranial CT reviewed, showing left SDH. Per report, increased size of left extra-axial collection compatible with acute on chronic subdural hematoma. Increased left sided mass effect with now a 4 mm of left-to-right midline shift. Neurosurgery follow-up currently on Decadron protocol and conservative care. Occupational therapy evaluation completed with recommendations of physical medicine rehabilitation consult. Patient was admitted for comprehensive rehabilitation program  Review of Systems  Constitutional: Negative for chills and fever.  HENT: Negative for hearing loss.   Eyes: Negative for blurred vision and double vision.  Respiratory: Negative for cough and shortness of breath.   Cardiovascular: Positive for leg swelling. Negative for chest pain and palpitations.  Gastrointestinal: Positive for constipation. Negative for nausea and vomiting.       GERD  Musculoskeletal: Positive for falls, joint pain and myalgias.  Skin: Negative for rash.  Neurological: Positive for weakness. Negative for seizures.  Psychiatric/Behavioral: Positive for depression and memory loss.  All other systems reviewed and are negative.      Past Medical History:  Diagnosis Date  . Chronic venous insufficiency   . Colon, diverticulosis Sept. 2011   outpatient  colonoscopy by Dr. Cristina Gong.  Need record  . DDD (degenerative disc disease), lumbosacral   . Depression   . DJD (degenerative joint disease), multiple sites    Low back pain worst  . GERD (gastroesophageal reflux disease)   . Gout   . Gout    Uric Acid level 4.2 on 300 allopurinol  . History of cervical cancer 1972  . Hx-TIA (transient ischemic attack) 05/13/2001   Right facial numbness  . Hyperlipidemia   . Hypertension   . Memory disorder 12/20/2016  . Mitral valve prolapse 10/27/1999   Subsequent 2D echo show normal mitral valve  . Osteoarthritis of hip    bilateral hips  . Ovarian cancer ([place]) 1972   S/P oophorectomy  . Psychosis   . Recurrent boils    History of MRSA skin infections with abscess  . Sensorineural hearing loss of both ears   . Subdural hematoma ([place]) 01/26/2017   bilateral        Past Surgical History:  Procedure Laterality Date  . ABDOMINAL HYSTERECTOMY     1972  . BURR HOLE Bilateral 02/12/2017   Procedure: Haskell Flirt;  Surgeon: Earnie Larsson, MD;  Location: [facility];  Service: Neurosurgery;  Laterality: Bilateral;  . CERVICAL DISCECTOMY  7/07   C5-C6  . JOINT REPLACEMENT     bilateral hip replacement  . LUMBAR DISC SURGERY     L5-S1 7/07  . LUMBAR LAMINECTOMY/DECOMPRESSION MICRODISCECTOMY N/A 02/02/2016   Procedure: L4-5 Decompression;  Surgeon: Marybelle Killings, MD;  Location: [facility];  Service: Orthopedics;  Laterality: N/A;  . [place] for ovarian cancer        Family History  Problem Relation Age of Onset  . Diabetes Mother   . Heart  disease Mother   . Hearing loss Mother   . Stroke Mother   . Post-traumatic stress disorder Father   . Schizophrenia Maternal Grandmother   . Diabetes Maternal Grandfather   . Other Brother        brother died in mental health hospital   Social History:  reports that she quit smoking about 3 years ago. Her smoking use included Cigarettes. She smoked 0.10 packs  per day. She has never used smokeless tobacco. She reports that she does not drink alcohol or use drugs. Allergies:       Allergies  Allergen Reactions  . Aspirin Other (See Comments)    Bleeding from vagina   . Sulfamethoxazole-Trimethoprim Itching         Medications Prior to Admission  Medication Sig Dispense Refill  . allopurinol (ZYLOPRIM) 300 MG tablet Take 1 tablet (300 mg total) by mouth daily. 90 tablet 3  . amLODipine (NORVASC) 10 MG tablet Take 1 tablet (10 mg total) by mouth daily. 90 tablet 3  . ascorbic acid (VITAMIN C) 500 MG tablet Take 500 mg by mouth daily.    [name] atorvastatin (LIPITOR) 40 MG tablet Take 1 tablet (40 mg total) by mouth daily. 90 tablet 3  . buPROPion (WELLBUTRIN XL) 300 MG 24 hr tablet Take 1 tablet (300 mg total) by mouth every morning. 30 tablet 2  . fluocinonide cream (LIDEX) 0.05 % APPLY TO AFFECTED AREA TWICE DAILY 60 g 1  . fluticasone (FLONASE) 50 MCG/ACT nasal spray INSTILL 2 SPRAYS IN EACH NOSTRIL DAILY 16 g 4  . furosemide (LASIX) 20 MG tablet TAKE ONE TABLET BY MOUTH DAILY. MAY TAKE AN EXTRA ONE AS NEEDED ONCE DAILY IF SWELLING OR EDEMA IS BAD 45 tablet 11  . HYDROcodone-acetaminophen (NORCO/VICODIN) 5-325 MG tablet Take 1-2 tablets by mouth every 6 (six) hours as needed for moderate pain. 60 tablet 0  . hydrOXYzine (VISTARIL) 25 MG capsule Take 1 capsule (25 mg total) by mouth 2 (two) times daily. (Patient taking differently: Take 25 mg by mouth at bedtime. ) 60 capsule 2  . losartan (COZAAR) 100 MG tablet Take 1 tablet (100 mg total) by mouth daily. 30 tablet 11  . Multiple Vitamins-Minerals (ALIVE WOMENS ENERGY) TABS Take 1 tablet by mouth daily.     [name] nystatin (MYCOSTATIN/NYSTOP) powder APPLY TO AFFECTED AREA TWICE DAILY 60 g 11  . OVER THE COUNTER MEDICATION Calciboost capsule: Take 1 capsule by mouth once a day    . oxybutynin (DITROPAN-XL) 5 MG 24 hr tablet Take 1 tablet (5 mg total) by mouth at bedtime. 90 tablet 3  .  pantoprazole (PROTONIX) 40 MG tablet TAKE 1 TABLET BY MOUTH TWICE DAILY BEFORE A MEAL 180 tablet 3  . potassium chloride (MICRO-K) 10 MEQ CR capsule Take 1 capsule (10 mEq total) by mouth daily. 90 capsule 3  . traMADol (ULTRAM) 50 MG tablet TAKE 1 TABLET BY MOUTH TWICE DAILY AS NEEDED (Patient taking differently: Take 50 mg by mouth two times a day as needed for pain) 60 tablet 0  . docusate sodium (COLACE) 100 MG capsule Take 1 capsule (100 mg total) by mouth every 12 (twelve) hours. (Patient not taking: Reported on 03/06/2017) 20 capsule 0    Home: Home Living Family/patient expects to be discharged to:: Private residence Living Arrangements: Children Available Help at Discharge: Family, Available 24 hours/day (daughter) Type of Home: House Home Access: Ramped entrance, Stairs to enter [employer] of Steps: 4 Entrance Stairs-Rails: Right Home Layout:  One level Bathroom Shower/Tub: Multimedia programmer: Standard Home Equipment: Environmental consultant - 4 wheels, [employer] - single point, Bedside commode, Walker - 2 wheels, Shower seat, Hand held shower head, Grab bars - tub/shower Additional Comments: has an aide 5 days a week for 2 hours    Functional History: Prior Function Level of Independence: Needs assistance Gait / Transfers Assistance Needed: pt has been using RW since admission in April, daughter reports LB weakness increasing ADL's / Homemaking Assistance Needed: daughter reports pt having difficulty with LB bathing and dressing, has been assisted for IADL since April  Functional Status:  Mobility: Bed Mobility General bed mobility comments: pt received in the chair Transfers Overall transfer level: Needs assistance Equipment used: Rolling walker (2 wheeled) Transfers: Sit to/from Stand Sit to Stand: Min assist Stand pivot transfers: Min assist General transfer comment: verbal cues for hand placement and setting up for standing, min assist for balance, pt blocking  knees on chair for support Ambulation/Gait Ambulation/Gait assistance: Min assist (with mod multimodal cues) Ambulation Distance (Feet): 8 Feet (x2) Assistive device: Rolling walker (2 wheeled) Gait Pattern/deviations: Step-to pattern, Step-through pattern, Decreased step length - right, Decreased step length - left, Decreased stance time - left, Decreased stride length General Gait Details: Very degraded gait pattern from previous admission--pt with very wide BOS, made 2-4 ince steps bilaterally with straight knees and rigid posture.  Started trying to break down pattern and transition to a more flexed knee, toe off pattern which was very difficult for pt. Gait velocity: slow Gait velocity interpretation: Below normal speed for age/gender  ADL: ADL Overall ADL's : Needs assistance/impaired Eating/Feeding: Independent, Sitting Grooming: Wash/dry hands, Wash/dry face, Sitting, Set up Upper Body Bathing: Set up, Sitting Lower Body Bathing: Maximal assistance, Sit to/from stand Upper Body Dressing : Set up, Sitting Lower Body Dressing: Maximal assistance, Sit to/from stand Toilet Transfer: Minimal assistance, Stand-pivot, RW Toilet Transfer Details (indicate cue type and reason): simulated to chair General ADL Comments: Pt unable to cross her foot over opposite knee. Poor balance in standing for pulling up pants. Pt blocks knees on chair for support in standing.  Cognition: Cognition Overall Cognitive Status: Impaired/Different from baseline Orientation Level: Oriented X4 Cognition Arousal/Alertness: Awake/alert Behavior During Therapy: WFL for tasks assessed/performed Overall Cognitive Status: Impaired/Different from baseline Area of Impairment: Memory, Safety/judgement, Following commands Memory: Decreased short-term memory Following Commands: Follows one step commands with increased time (and multimodal cues) Safety/Judgement: Decreased awareness of safety, Decreased awareness of  deficits Problem Solving: Slow processing, Difficulty sequencing  Physical Exam: Blood pressure 115/76, pulse 70, temperature 98.5 F (36.9 C), temperature source Oral, resp. rate (!) 22, height _0  (1.6 m), weight 90.2 kg (198 lb 13.7 oz), SpO2 98 %. Physical Exam  Vitals reviewed. Constitutional: She appears well-developed.  HENT:  Head: Normocephalic.  Eyes: EOM are normal. Right eye exhibits no discharge. Left eye exhibits no discharge.  Neck: Normal range of motion. Neck supple. No tracheal deviation present. No thyromegaly present.  Cardiovascular: Normal rate and regular rhythm.   Respiratory: Effort normal and breath sounds normal. No respiratory distress. She has no wheezes.  GI: Soft. Bowel sounds are normal. She exhibits no distension. There is no tenderness.  Skin. Warm and dry Neurological: She is alert.  She makes good eye contact with examiner and follows basic commands.  She provides her name age and date of birth.Oriented to hospital day and date Remebers 2/3 unrelated objects after a delay Motor: B/l UE 5/5 proximal to distal  B/l LE: 4/5 HF, 4/5 Add, 4/5 Hip Abd, KE, 5/5 ADF/PF   Lab Results Last 48 Hours        Results for orders placed or performed during the hospital encounter of 03/06/17 (from the past 48 hour(s))  CBC with Differential     Status: Abnormal   Collection Time: 03/06/17  1:31 PM  Result Value Ref Range   WBC 7.3 4.0 - 10.5 K/uL   RBC 3.70 (L) 3.87 - 5.11 MIL/uL   Hemoglobin 11.2 (L) 12.0 - 15.0 g/dL   HCT 34.8 (L) 36.0 - 46.0 %   MCV 94.1 78.0 - 100.0 fL   MCH 30.3 26.0 - 34.0 pg   MCHC 32.2 30.0 - 36.0 g/dL   RDW 15.4 11.5 - 15.5 %   Platelets 224 150 - 400 K/uL   Neutrophils Relative % 65 %   Neutro Abs 4.7 1.7 - 7.7 K/uL   Lymphocytes Relative 23 %   Lymphs Abs 1.7 0.7 - 4.0 K/uL   Monocytes Relative 5 %   Monocytes Absolute 0.4 0.1 - 1.0 K/uL   Eosinophils Relative 7 %   Eosinophils Absolute 0.5 0.0 - 0.7  K/uL   Basophils Relative 0 %   Basophils Absolute 0.0 0.0 - 0.1 K/uL  Comprehensive metabolic panel     Status: Abnormal   Collection Time: 03/06/17  1:31 PM  Result Value Ref Range   Sodium 141 135 - 145 mmol/L   Potassium 3.6 3.5 - 5.1 mmol/L   Chloride 110 101 - 111 mmol/L   CO2 21 (L) 22 - 32 mmol/L   Glucose, Bld 103 (H) 65 - 99 mg/dL   BUN 11 6 - 20 mg/dL   Creatinine, Ser 1.18 (H) 0.44 - 1.00 mg/dL   Calcium 9.5 8.9 - 10.3 mg/dL   Total Protein 7.1 6.5 - 8.1 g/dL   Albumin 3.8 3.5 - 5.0 g/dL   AST 25 15 - 41 U/L   ALT 19 14 - 54 U/L   Alkaline Phosphatase 107 38 - 126 U/L   Total Bilirubin 0.8 0.3 - 1.2 mg/dL   GFR calc non Af Amer 44 (L) >60 mL/min   GFR calc Af Amer 51 (L) >60 mL/min    Comment: (NOTE) The eGFR has been calculated using the CKD EPI equation. This calculation has not been validated in all clinical situations. eGFR's persistently <60 mL/min signify possible Chronic Kidney Disease.    Anion gap 10 5 - 15  MRSA PCR Screening     Status: None   Collection Time: 03/06/17  6:21 PM  Result Value Ref Range   MRSA by PCR NEGATIVE NEGATIVE    Comment:        The GeneXpert MRSA Assay (FDA approved for NASAL specimens only), is one component of a comprehensive MRSA colonization surveillance program. It is not intended to diagnose MRSA infection nor to guide or monitor treatment for MRSA infections.   Glucose, capillary     Status: Abnormal   Collection Time: 03/07/17  7:56 AM  Result Value Ref Range   Glucose-Capillary 143 (H) 65 - 99 mg/dL   Comment 1 Notify RN    Comment 2 Document in Chart       Imaging Results (Last 48 hours)  Ct Head Wo Contrast  Result Date: 03/06/2017 CLINICAL DATA:  Fall today.  Recent bilateral subdural hematomas EXAM: CT HEAD WITHOUT CONTRAST TECHNIQUE: Contiguous axial images were obtained from the base of the skull through the vertex without  intravenous contrast. COMPARISON:  CT head  without contrast 03/01/2017 FINDINGS: Brain: The left extra-axial collection has increased. Acute blood products are noted. Mixed density collections are present suggesting acute on chronic hemorrhage. The collection measures 18 mm maximally on the coronal images. There is further extension of the left temporal lobe. Previously noted leftward midline shift has changed. There is now 4 mm of left to right midline shift. The mixed attenuation right subdural collection is stable. No acute cortical infarct is present. The ventricles are compressed bilaterally. There is no hydrocephalus. Brainstem and cerebellum are normal. Vascular: Atherosclerotic calcifications are present within the cavernous internal carotid arteries bilaterally. There is no hyperdense vessel. Skull: Bilateral frontal burr holes are present. Sinuses/Orbits: The paranasal sinuses and mastoid air cells are clear. The globes and orbits are within normal limits. IMPRESSION: 1. Increased size of left extra-axial collection compatible with acute on chronic subdural hematoma. 2. Increased left-sided mass effect with now 4 mm of left to right midline shift. 3. Stable appearance of mixed attenuation right subdural hematoma. These results were called by telephone at the time of interpretation on 03/06/2017 at 1:06 pm to Dr. Gareth Morgan , who verbally acknowledged these results. Electronically Signed   By: San Morelle M.D.   On: 03/06/2017 13:08   Dg Hip Unilat With Pelvis 2-3 Views Left  Result Date: 03/06/2017 CLINICAL DATA:  Left hip pain after fall at home today. Initial encounter. EXAM: DG HIP (WITH OR WITHOUT PELVIS) 2-3V LEFT COMPARISON:  None. FINDINGS: Bilateral total hip arthroplasty. No evidence of fracture or loosening. No dislocation. The pelvic ring appears intact. Osteopenia and atherosclerosis. IMPRESSION: No acute finding. Electronically Signed   By: Monte Fantasia M.D.   On: 03/06/2017 15:09        Medical Problem  List and Plan: 1.  Decreased functional mobility secondary to acute on chronic left traumatic SDH status post bilateral frontal burr hole 02/12/2017 2.  DVT Prophylaxis/Anticoagulation: SCDs. Patient is ambulatory 3. Pain Management with history of bilateral total hip replacement: Ultram as needed 4. Mood: Wellbutrin 300 mg daily, hydroxyzine 25 mg daily at bedtime 5. Neuropsych: This patient is capable of making decisions on her own behalf. 6. Skin/Wound Care: Routine skin checks 7. Fluids/Electrolytes/Nutrition: Routine I&O with follow-up chemistries 8. Hypertension. Norvasc 10 mg daily, Lasix 20 mg daily, Cozaar 100 mg daily. Monitor with increased mobility 9. Gout. Allopurinol. Monitor for any gout flareups 10. Overactive bladder. Did trip and 5 mg daily. Check PVR 3 11. Hyperlipidemia. Lipitor 12. GERD. Protonix 13. Constipation. Laxative assistance  Post Admission Physician Evaluation: 1. Functional deficits secondary  to Acute on chronic Left traumatic SDH. 2. Patient is admitted to receive collaborative, interdisciplinary care between the physiatrist, rehab nursing staff, and therapy team. 3. Patient's level of medical complexity and substantial therapy needs in context of that medical necessity cannot be provided at a lesser intensity of care such as a SNF. 4. Patient has experienced substantial functional loss from his/her baseline which was documented above under the "Functional History" and "Functional Status" headings.  Judging by the patient's diagnosis, physical exam, and functional history, the patient has potential for functional progress which will result in measurable gains while on inpatient rehab.  These gains will be of substantial and practical use upon discharge  in facilitating mobility and self-care at the household level. 5. Physiatrist will provide 24 hour management of medical needs as well as oversight of the therapy plan/treatment and provide guidance as  appropriate regarding the interaction  of the two. 6. The Preadmission Screening has been reviewed and patient status is unchanged unless otherwise stated above. 7. 24 hour rehab nursing will assist with bladder management, bowel management, safety, skin/wound care, disease management, medication administration, pain management and patient education  and help integrate therapy concepts, techniques,education, etc. 8. PT will assess and treat for/with: pre gait, gait training, endurance , safety, equipment, neuromuscular re education.   Goals are: Mod I/Sup. 9. OT will assess and treat for/with: ADLs, Cognitive perceptual skills, Neuromuscular re education, safety, endurance, equipment.   Goals are: Mod I/Sup. Therapy may proceed with showering this patient. 10. SLP will assess and treat for/with: Judgement problem solving med management.  Goals are: ModI/Sup. 11. Case Management and Social Worker will assess and treat for psychological issues and discharge planning. 12. Team conference will be held weekly to assess progress toward goals and to determine barriers to discharge. 13. Patient will receive at least 3 hours of therapy per day at least 5 days per week. 14. ELOS: 7-10d       15. Prognosis:  good     Charlett Blake M.D. [facility] FAAPM&R (Sports Med, Neuromuscular Med) Diplomate Am Board of Electrodiagnostic Med  Cathlyn Parsons., PA-C 03/08/2017

## 2017-03-08 NOTE — Progress Notes (Signed)
Overall stable. Awaiting inpatient rehabilitation authorization.

## 2017-03-08 NOTE — Progress Notes (Signed)
Physical Therapy Treatment Patient Details Name: [name] MRN: [id number] DOB: Mar 05, 1941 Today's Date: 03/08/2017    History of Present Illness [name] is a 76 y.o. female with a past medical history significant for schizophrenia well-compensated, OSA not on CPAP, HTN, hx of TIA and gout was admitted following a fall. Pt with hx significant for multiple falls and daughter reports increased LE weakness. Pt s/p B burr holes in April 2018.     PT Comments    Pt continues to require assistance for OOB mobility. Continue to recommend CIR for further skilled PT services to maximize independence and safety with mobility prior to d/c home.   Follow Up Recommendations  CIR     Equipment Recommendations  None recommended by PT    Recommendations for Other Services Rehab consult     Precautions / Restrictions Precautions Precautions: Fall Restrictions Weight Bearing Restrictions: No    Mobility  Bed Mobility Overal bed mobility: Needs Assistance Bed Mobility: Sit to Supine       Sit to supine: Min assist   General bed mobility comments: assist to bring bilat LE into bed   Transfers Overall transfer level: Needs assistance Equipment used: Rolling walker (2 wheeled) Transfers: Sit to/from Stand Sit to Stand: Min assist Stand pivot transfers: Min assist       General transfer comment: cues for safe hand placement and technique; assist to power up into standing; X4 sit to stands; pt with poor eccentric loading however did began demonstrating carry over of safe technique; pt with short shuffling steps and assist required for management of RW  Ambulation/Gait Ambulation/Gait assistance: Min assist Ambulation Distance (Feet): 6 Feet Assistive device: Rolling walker (2 wheeled) Gait Pattern/deviations: Shuffle;Trunk flexed Gait velocity: slow   General Gait Details: pt with flexed trunk and with short shuffling steps; short distance in room with SpO2 down to  86% on RA with mobility but up to 96% quickly; multimodal cues for posture and assist for safe use of RW   Stairs            Wheelchair Mobility    Modified Rankin (Stroke Patients Only)       Balance Overall balance assessment: Needs assistance Sitting-balance support: Feet supported;No upper extremity supported Sitting balance-Leahy Scale: Fair     Standing balance support: Bilateral upper extremity supported Standing balance-Leahy Scale: Poor Standing balance comment: UE support needed for balance                            Cognition Arousal/Alertness: Awake/alert Behavior During Therapy: WFL for tasks assessed/performed Overall Cognitive Status: Impaired/Different from baseline Area of Impairment: Memory;Safety/judgement;Following commands;Attention;Awareness;Problem solving                   Current Attention Level: Sustained Memory: Decreased short-term memory Following Commands: Follows one step commands inconsistently;Follows one step commands with increased time (and multimodal cues) Safety/Judgement: Decreased awareness of safety;Decreased awareness of deficits Awareness: Emergent Problem Solving: Difficulty sequencing;Requires verbal cues;Requires tactile cues General Comments: pt required step by step cues for performing functional transfers      Exercises General Exercises - Lower Extremity Hip Flexion/Marching: AROM;Both;10 reps;Standing Mini-Sqauts: AROM;Both;5 reps;Standing    General Comments        Pertinent Vitals/Pain Pain Assessment: No/denies pain    Home Living                      Prior Function  PT Goals (current goals can now be found in the care plan section) Acute Rehab PT Goals Patient Stated Goal: To return to independent PT Goal Formulation: With patient/family Time For Goal Achievement: 03/21/17 Potential to Achieve Goals: Good    Frequency    Min 4X/week      PT Plan  Current plan remains appropriate    Co-evaluation              AM-PAC PT "6 Clicks" Daily Activity  Outcome Measure  Difficulty turning over in bed (including adjusting bedclothes, sheets and blankets)?: A Lot Difficulty moving from lying on back to sitting on the side of the bed? : A Lot Difficulty sitting down on and standing up from a chair with arms (e.g., wheelchair, bedside commode, etc,.)?: Total Help needed moving to and from a bed to chair (including a wheelchair)?: A Little Help needed walking in hospital room?: A Little Help needed climbing 3-5 steps with a railing? : A Lot 6 Click Score: 13    End of Session Equipment Utilized During Treatment: Gait belt Activity Tolerance: Patient tolerated treatment well Patient left: with call bell/phone within reach;in bed;with SCD's reapplied Nurse Communication: Mobility status PT Visit Diagnosis: Unsteadiness on feet (R26.81);Other abnormalities of gait and mobility (R26.89);Muscle weakness (generalized) (M62.81);History of falling (Z91.81)     Time: 1630-1700 PT Time Calculation (min) (ACUTE ONLY): 30 min  Charges:  $Therapeutic Activity: 23-37 mins                    G Codes:       Earney Navy, PTA Pager: [phone]     Darliss Cheney 03/08/2017, 5:14 PM

## 2017-03-08 NOTE — Progress Notes (Signed)
Pt arrived to unit at this time.  RN oriented pt and daughter to unit, fall prevention plan, rehab safety plan and the rehab notebook for brain injury.Sindy Guadeloupe

## 2017-03-08 NOTE — PMR Pre-admission (Signed)
PMR Admission Coordinator Pre-Admission Assessment  Patient: [name] is an 76 y.o., female MRN: [id number] DOB: 07-28-1941 Height: 5\' 3"  (160 cm) Weight: 90.2 kg (198 lb 13.7 oz)              Insurance Information HMO:     PPO: X     PCP:      IPA:      80/20:      OTHER:  PRIMARY: [employer]       Policy#: [id number]      Subscriber: Self CM Name: Eula Listen       Phone#: [phone]     Fax#: [phone] Pre-Cert#: [id number] for 7 days; EMR access for reviews      Employer: Retired  Benefits:  Phone #: [phone]     Name: automated phone line  Eff. Date: 10/31/15     Deduct: $0      Out of Pocket Max: $3,400      Life Max: N/A CIR: $250 a day, days 1-6 then $0 a day      SNF: $0 a day, days 1-20; $150 a day, days 21-100 Outpatient: PT/OT/SLP necessity      Co-Pay:  $15 a visit  Home Health: PT/OT/SLP necessity      Co-Pay: $25 a visit  DME: 80%     Co-Pay: 20% Providers: in network   SECONDARY: Medicaid of [place]      Policy#: [id number]      Subscriber: Self CM Name:       Phone#:      Fax#:  Pre-Cert#: [id number]      Employer: Retired  Dealer:  Phone #: [phone]     Name:  Eff. Date: eligible as of 03/08/17     Deduct:       Out of Pocket Max:       Life Max:  CIR:       SNF:  Outpatient:      Co-Pay:  Home Health:       Co-Pay:  DME:      Co-Pay:   Medicaid Application Date:       Case Manager:  Disability Application Date:       Case Worker:   Emergency Contact Information Contact Information    Name Relation Home Work Mobile   Poole,[name] Daughter [phone]       Current Medical History  Patient Admitting Diagnosis: Acute on chronic left SDH  History of Present Illness: Shelby Brunetti. McKinneyis a 76 y.o.right handed femalewith history ofbilateral hip replacement,bilateral frontal burr hole for evacuation of subdural hematoma in April 16 by Dr.Poolafter recent fall. She was discharged to home 02/15/2017 with her daughter at minimal assist level  using a walkerand home health therapies.Her daughter works during the day and her niece plans to assist as needed.Presented 03/06/2017 with multiple falls and reports of increasing lower extremity weakness. There has been no loss of consciousness. Cranial CT reviewed, showing left SDH. Per report, increased size of left extra-axial collection compatible with acute on chronic subdural hematoma. Increased left sided mass effect with now a 4 mm of left-to-right midline shift. Neurosurgery follow-up currently on Decadron protocol and conservative care. Occupational and physical therapy recommending physical medicine rehabilitation consult. Patient was admitted for comprehensive rehabilitation program 03/08/17.      Past Medical History  Past Medical History:  Diagnosis Date  . Chronic venous insufficiency   . Colon, diverticulosis Sept. 2011   outpatient colonoscopy by Dr. Cristina Gong.  Need record  . DDD (degenerative disc disease), lumbosacral   . Depression   . DJD (degenerative joint disease), multiple sites    Low back pain worst  . GERD (gastroesophageal reflux disease)   . Gout   . Gout    Uric Acid level 4.2 on 300 allopurinol  . History of cervical cancer 1972  . Hx-TIA (transient ischemic attack) 05/13/2001   Right facial numbness  . Hyperlipidemia   . Hypertension   . Memory disorder 12/20/2016  . Mitral valve prolapse 10/27/1999   Subsequent 2D echo show normal mitral valve  . Osteoarthritis of hip    bilateral hips  . Ovarian cancer ([place]) 1972   S/P oophorectomy  . Psychosis   . Recurrent boils    History of MRSA skin infections with abscess  . Sensorineural hearing loss of both ears   . Subdural hematoma ([place]) 01/26/2017   bilateral    Family History  family history includes Diabetes in her maternal grandfather and mother; Hearing loss in her mother; Heart disease in her mother; Other in her brother; Post-traumatic stress disorder in her father; Schizophrenia in her  maternal grandmother; Stroke in her mother.  Prior Rehab/Hospitalizations:  Has the patient had major surgery during 100 days prior to admission? Yes  Current Medications   Current Facility-Administered Medications:  .  0.9 %  sodium chloride infusion, 250 mL, Intravenous, PRN, Costella, Vincent J, PA-C .  0.9 %  sodium chloride infusion, , Intravenous, Continuous, Costella, Vincent J, PA-C .  acetaminophen (TYLENOL) tablet 650 mg, 650 mg, Oral, Q6H PRN, 650 mg at 03/06/17 1827 **OR** acetaminophen (TYLENOL) suppository 650 mg, 650 mg, Rectal, Q6H PRN, Costella, Vincent J, PA-C .  allopurinol (ZYLOPRIM) tablet 300 mg, 300 mg, Oral, Daily, Costella, Vincent J, PA-C, 300 mg at 03/08/17 0946 .  amLODipine (NORVASC) tablet 10 mg, 10 mg, Oral, Daily, Costella, Vincent J, PA-C, 10 mg at 03/08/17 0947 .  atorvastatin (LIPITOR) tablet 40 mg, 40 mg, Oral, Daily, Costella, Vincent J, PA-C, 40 mg at 03/08/17 0946 .  bisacodyl (DULCOLAX) suppository 10 mg, 10 mg, Rectal, Daily PRN, Costella, Vincent J, PA-C .  buPROPion (WELLBUTRIN XL) 24 hr tablet 300 mg, 300 mg, Oral, q morning - 10a, Costella, Vincent J, PA-C, 300 mg at 03/08/17 0946 .  dexamethasone (DECADRON) tablet 4 mg, 4 mg, Oral, Q6H, Pool, Mallie Mussel, MD, 4 mg at 03/08/17 1357 .  docusate sodium (COLACE) capsule 100 mg, 100 mg, Oral, BID, Costella, Vincent J, PA-C, 100 mg at 03/08/17 0947 .  furosemide (LASIX) tablet 20 mg, 20 mg, Oral, Daily, Costella, Vincent J, PA-C, 20 mg at 03/08/17 0947 .  HYDROcodone-acetaminophen (NORCO/VICODIN) 5-325 MG per tablet 1-2 tablet, 1-2 tablet, Oral, Q4H PRN, Costella, Vista Mink, PA-C, 2 tablet at 03/07/17 1748 .  hydrOXYzine (ATARAX/VISTARIL) tablet 25 mg, 25 mg, Oral, QHS, Costella, Vincent J, PA-C, 25 mg at 03/07/17 2219 .  losartan (COZAAR) tablet 100 mg, 100 mg, Oral, Daily, Costella, Vincent J, PA-C, 100 mg at 03/08/17 0947 .  magnesium citrate solution 1 Bottle, 1 Bottle, Oral, Once PRN, Costella, Vincent J,  PA-C .  ondansetron (ZOFRAN) tablet 4 mg, 4 mg, Oral, Q6H PRN **OR** ondansetron (ZOFRAN) injection 4 mg, 4 mg, Intravenous, Q6H PRN, Costella, Vincent J, PA-C .  oxybutynin (DITROPAN-XL) 24 hr tablet 5 mg, 5 mg, Oral, QHS, Costella, Vincent J, PA-C, 5 mg at 03/07/17 2219 .  pantoprazole (PROTONIX) EC tablet 20 mg, 20 mg, Oral, BID, Costella, [employer], PA-C, 20  mg at 03/08/17 0945 .  polyethylene glycol (MIRALAX / GLYCOLAX) packet 17 g, 17 g, Oral, Daily PRN, Costella, Vincent J, PA-C .  potassium chloride (K-DUR,KLOR-CON) CR tablet 10 mEq, 10 mEq, Oral, Daily, Costella, Vincent J, PA-C, 10 mEq at 03/08/17 0946 .  sodium chloride flush (NS) 0.9 % injection 3 mL, 3 mL, Intravenous, Q12H, Costella, Vincent J, PA-C, 3 mL at 03/08/17 0947 .  sodium chloride flush (NS) 0.9 % injection 3 mL, 3 mL, Intravenous, PRN, Costella, Vista Mink, PA-C .  traMADol (ULTRAM) tablet 50 mg, 50 mg, Oral, BID PRN, Costella, Vincent J, PA-C, 50 mg at 03/06/17 1525 .  zolpidem (AMBIEN) tablet 5 mg, 5 mg, Oral, QHS PRN, Costella, Vista Mink, PA-C  Patients Current Diet: Diet Heart Room service appropriate? Yes; Fluid consistency: Thin  Precautions / Restrictions Precautions Precautions: (P) Fall Restrictions Weight Bearing Restrictions: No   Has the patient had 2 or more falls or a fall with injury in the past year?Yes, 5 in the last month with final fall resulting in this hospitalization  Prior Activity Level Limited Community (1-2x/wk): Prior to [place] patient was independent at home for basic self-care tasks except for aide to helping with bathing.  In April patient sustained a fall and went home with therapies.  She has not progressed beyond a Min A level and was having multiple falls.  Patient want to get back to being independent as possible in the house.  Prior to all this she enjoyed visiting with her priest and going out to eat with her friend.    Home Assistive Devices / Equipment Home Assistive  Devices/Equipment: Bathtub lift, Built-in shower seat, Cane (specify quad or straight), Eyeglasses, Other (Comment), Tub transfer bench, Walker (specify type), Hearing aid, Blood pressure cuff Home Equipment: Walker - 4 wheels, Cane - single point, Bedside commode, Walker - 2 wheels, Shower seat, Hand held shower head, Grab bars - tub/shower  Prior Device Use: Indicate devices/aids used by the patient prior to current illness, exacerbation or injury? Motorized wheelchair or scooter and Environmental consultant  Prior Functional Level Prior Function Level of Independence: Needs assistance Gait / Transfers Assistance Needed: pt has been using RW since admission in April, daughter reports LB weakness increasing ADL's / Homemaking Assistance Needed: daughter reports pt having difficulty with LB bathing and dressing, has been assisted for IADL since April  Self Care: Did the patient need help bathing, dressing, using the toilet or eating? Needed some help  Indoor Mobility: Did the patient need assistance with walking from room to room (with or without device)? Needed some help  Stairs: Did the patient need assistance with internal or external stairs (with or without device)? Dependent  Functional Cognition: Did the patient need help planning regular tasks such as shopping or remembering to take medications? Needed some help  Current Functional Level Cognition  Overall Cognitive Status: (P) Impaired/Different from baseline Current Attention Level: (P) Sustained Orientation Level: Oriented X4 Following Commands: (P) Follows one step commands inconsistently, Follows one step commands with increased time Safety/Judgement: (P) Decreased awareness of safety, Decreased awareness of deficits General Comments: (P) Pt reports "I cant" frequently when asked to participate in functional activities. Needs cues for initiation and sequencing of BADL.    Extremity Assessment (includes Sensation/Coordination)  Upper Extremity  Assessment: Overall WFL for tasks assessed  Lower Extremity Assessment: RLE deficits/detail, LLE deficits/detail RLE Deficits / Details: strength--hip flex 2+/5, quads 3+, hams 3+, df/pf 3- ;  pt slow to follow sequencing needing multimodal  cues.  Notably weaker than L LE RLE Coordination: decreased fine motor LLE Deficits / Details: strength:  hip flex 3/5, quads, 4-, hams, 4-, df/pf 3- LLE Coordination: decreased fine motor    ADLs  Overall ADL's : (P) Needs assistance/impaired Eating/Feeding: Independent, Sitting Grooming: Wash/dry hands, Wash/dry face, Sitting, Set up Upper Body Bathing: Set up, Sitting Lower Body Bathing: Maximal assistance, Sit to/from stand Upper Body Dressing : Set up, Sitting Lower Body Dressing: Maximal assistance, Sit to/from stand Toilet Transfer: Minimal assistance, Stand-pivot, RW Toilet Transfer Details (indicate cue type and reason): simulated to chair General ADL Comments: Pt unable to cross her foot over opposite knee. Poor balance in standing for pulling up pants. Pt blocks knees on chair for support in standing.    Mobility  General bed mobility comments: pt received in the chair    Transfers  Overall transfer level: Needs assistance Equipment used: Rolling walker (2 wheeled) Transfers: Sit to/from Stand Sit to Stand: Min assist Stand pivot transfers: Min assist General transfer comment: verbal cues for hand placement and setting up for standing, min assist for balance, pt blocking knees on chair for support    Ambulation / Gait / Stairs / Wheelchair Mobility  Ambulation/Gait Ambulation/Gait assistance: Min assist (with mod multimodal cues) Ambulation Distance (Feet): 8 Feet (x2) Assistive device: Rolling walker (2 wheeled) Gait Pattern/deviations: Step-to pattern, Step-through pattern, Decreased step length - right, Decreased step length - left, Decreased stance time - left, Decreased stride length General Gait Details: Very degraded gait  pattern from previous admission--pt with very wide BOS, made 2-4 ince steps bilaterally with straight knees and rigid posture.  Started trying to break down pattern and transition to a more flexed knee, toe off pattern which was very difficult for pt. Gait velocity: slow Gait velocity interpretation: Below normal speed for age/gender    Posture / Balance Dynamic Sitting Balance Sitting balance - Comments: unsafe access feet in sitting Balance Overall balance assessment: Needs assistance Sitting balance-Leahy Scale: Fair Sitting balance - Comments: unsafe access feet in sitting Standing balance support: Bilateral upper extremity supported Standing balance-Leahy Scale: Poor Standing balance comment: UE support needed for balance    Special needs/care consideration BiPAP/CPAP: No CPM: No Continuous Drip IV: No Dialysis: No        Life Vest: No Oxygen: No Special Bed: No Trach Size: No Wound Vac (area): No       Skin: Bruising of legs and butt                             Bowel mgmt: 03/08/17 continent  Bladder mgmt: Intermittent continence  Diabetic mgmt: No     Previous Home Environment Living Arrangements: Children Available Help at Discharge: Family, Available 24 hours/day (daughter) Type of Home: House Home Layout: One level Home Access: Ramped entrance, Stairs to enter Entrance Stairs-Rails: Right Entrance Stairs-Number of Steps: 4 Bathroom Shower/Tub: Multimedia programmer: Standard Home Care Services: Yes Type of Home Care Services: Homehealth aide, Home PT, Home RN, [place] (if known): [facility] Additional Comments: has an aide 5 days a week for 2 hours   Discharge Living Setting Plans for Discharge Living Setting: Patient's home, Alone Type of Home at Discharge: House Discharge Home Layout: One level Discharge Home Access: Ramped entrance Discharge Bathroom Shower/Tub: Walk-in shower Discharge Bathroom Toilet: Handicapped  height Discharge Bathroom Accessibility: Yes How Accessible: Accessible via walker Does the patient have  any problems obtaining your medications?: No  Social/Family/Support Systems Patient Roles: Parent, Other (Comment) (grandparent ) Contact Information: Daughter: [name] [phone] Anticipated Caregiver: Daughter and granddaughter  Anticipated Caregiver's Contact Information: see above  Ability/Limitations of Caregiver: when daughter works granddaughter will be home Caregiver Availability: 24/7 Discharge Plan Discussed with Primary Caregiver: Yes Is Caregiver In Agreement with Plan?: Yes Does Caregiver/Family have Issues with Lodging/Transportation while Pt is in Rehab?: No  Goals/Additional Needs Patient/Family Goal for Rehab: PT/OT Min-Supervision; SLP Supervision  Expected length of stay: 7-10 days Cultural Considerations: Catholic  Dietary Needs: Heart Healthy restrictions  Equipment Needs: TBD Special Service Needs: None Additional Information: Patient had an aide at home for 2-3 hours a day 4 days a week  Pt/Family Agrees to Admission and willing to participate: Yes Program Orientation Provided & Reviewed with Pt/Caregiver Including Roles  & Responsibilities: Yes Additional Information Needs: None Information Needs to be Provided By: N/A   Decrease burden of Care through IP rehab admission: No  Possible need for SNF placement upon discharge: No  Patient Condition: This patient's medical and functional status has changed since the consult dated 03/07/17 in which the Rehabilitation Physician determined and documented that the patient was potentially appropriate for intensive rehabilitative care in an inpatient rehabilitation facility. Issues have been addressed and update has been discussed with Dr. Posey Pronto and Letta Pate and patient now appropriate for inpatient rehabilitation. Will admit to inpatient rehab today.   Preadmission Screen Completed By:  [name],  03/08/2017 3:29 PM ______________________________________________________________________   Discussed status with Dr. Letta Pate on 03/08/17 at 15 and received telephone approval for admission today.  Admission Coordinator:  [name], time 1600/Date 03/08/17

## 2017-03-08 NOTE — Discharge Summary (Signed)
Physician Discharge Summary  Patient ID: [name] MRN: [id number] DOB/AGE: 1941/03/17 76 y.o.  Admit date: 03/06/2017 Discharge date: 03/08/2017  Admission Diagnoses:  Discharge Diagnoses:  Active Problems:   Subdural hematoma (HCC)   Fall   TBI (traumatic brain injury) ([place])   History of bilateral hip replacements   Steroid-induced hyperglycemia   Acute blood loss anemia   Elevated blood pressure reading   Stage 3 chronic kidney disease   Discharged Condition: fair  Hospital Course: Patient readmitted to hospital with frequent falling episodes.  CT scans demonstrate some residual left-sided chronic subdural hematoma with minimal mass effect.  I do not feel that additional surgery is likely to improve her situation it would rather like this gradually resort over time.  The patient is to be transferred to inpatient rehabilitation for further convalescence.  Consults:   Significant Diagnostic Studies:   Treatments:   Discharge Exam: Blood pressure 121/78, pulse 84, temperature 99.1 F (37.3 C), temperature source Oral, resp. rate (!) 22, height 5\' 3"  (1.6 m), weight 90.2 kg (198 lb 13.7 oz), SpO2 95 %.   Patient is awake and alert.  She is oriented and appropriate.  Her cranial nerve function is intact.  Her motor and sensory function of her extremities are normal.  Her wounds are clean and dry.  Her chest and abdomen are benign  Disposition: 01-Home or Self Care      Signed: Charlyn Vialpando A 03/08/2017, 4:40 PM

## 2017-03-08 NOTE — Clinical Social Work Note (Signed)
CSW acknowledges SNF consult. Patient discharging to CIR today.  CSW signing off.  Esthefany Herrig, CSW [phone]  

## 2017-03-08 NOTE — Progress Notes (Addendum)
Inpatient Rehabilitation  I have discussed case with team and feel that patient is very appropriate for IP Rehab.  Will initiate insurance authorization.  Will update that team as I know.  Please call with questions.    Carmelia Roller., CCC/SLP Admission Coordinator  [place]  Cell [phone]

## 2017-03-08 NOTE — Progress Notes (Signed)
Inpatient Rehabilitation  I have received insurance authorization from [employer] and medication clearance from Dr. Annette Stable.  Will proceed with IP Rehab admission.   Carmelia Roller., CCC/SLP Admission Coordinator  [place]  Cell [phone]

## 2017-03-08 NOTE — Progress Notes (Signed)
Inpatient Rehabilitation  Met with patient to discuss team's recommendation for IP Rehab, patient and daughter strongly in agreement.  Shared booklets and answered questions.  Hopeful for word back from insurance today or tomorrow.  Will update team as I know.  Please call with questions.   Carmelia Roller., CCC/SLP Admission Coordinator  [place]  Cell [phone]

## 2017-03-09 ENCOUNTER — Inpatient Hospital Stay (HOSPITAL_COMMUNITY): Payer: PPO | Admitting: Occupational Therapy

## 2017-03-09 ENCOUNTER — Inpatient Hospital Stay (HOSPITAL_COMMUNITY): Payer: Self-pay

## 2017-03-09 ENCOUNTER — Inpatient Hospital Stay (HOSPITAL_COMMUNITY): Payer: Self-pay | Admitting: Speech Pathology

## 2017-03-09 ENCOUNTER — Inpatient Hospital Stay (HOSPITAL_COMMUNITY): Payer: PPO | Admitting: Physical Therapy

## 2017-03-09 DIAGNOSIS — I10 Essential (primary) hypertension: Secondary | ICD-10-CM

## 2017-03-09 DIAGNOSIS — D72828 Other elevated white blood cell count: Secondary | ICD-10-CM

## 2017-03-09 DIAGNOSIS — S065X1S Traumatic subdural hemorrhage with loss of consciousness of 30 minutes or less, sequela: Secondary | ICD-10-CM

## 2017-03-09 DIAGNOSIS — R7989 Other specified abnormal findings of blood chemistry: Secondary | ICD-10-CM

## 2017-03-09 LAB — CBC WITH DIFFERENTIAL/PLATELET
Basophils Absolute: 0 10*3/uL (ref 0.0–0.1)
Basophils Relative: 0 %
Eosinophils Absolute: 0 10*3/uL (ref 0.0–0.7)
Eosinophils Relative: 0 %
HCT: 31.7 % — ABNORMAL LOW (ref 36.0–46.0)
Hemoglobin: 10.2 g/dL — ABNORMAL LOW (ref 12.0–15.0)
Lymphocytes Relative: 13 %
Lymphs Abs: 1.6 10*3/uL (ref 0.7–4.0)
MCH: 30.3 pg (ref 26.0–34.0)
MCHC: 32.2 g/dL (ref 30.0–36.0)
MCV: 94.1 fL (ref 78.0–100.0)
Monocytes Absolute: 0.5 10*3/uL (ref 0.1–1.0)
Monocytes Relative: 4 %
Neutro Abs: 10.7 10*3/uL — ABNORMAL HIGH (ref 1.7–7.7)
Neutrophils Relative %: 83 %
Platelets: 236 10*3/uL (ref 150–400)
RBC: 3.37 MIL/uL — ABNORMAL LOW (ref 3.87–5.11)
RDW: 15.5 % (ref 11.5–15.5)
WBC: 12.9 10*3/uL — ABNORMAL HIGH (ref 4.0–10.5)

## 2017-03-09 LAB — COMPREHENSIVE METABOLIC PANEL
ALT: 20 U/L (ref 14–54)
AST: 26 U/L (ref 15–41)
Albumin: 3.3 g/dL — ABNORMAL LOW (ref 3.5–5.0)
Alkaline Phosphatase: 83 U/L (ref 38–126)
Anion gap: 8 (ref 5–15)
BUN: 26 mg/dL — ABNORMAL HIGH (ref 6–20)
CO2: 23 mmol/L (ref 22–32)
Calcium: 9 mg/dL (ref 8.9–10.3)
Chloride: 108 mmol/L (ref 101–111)
Creatinine, Ser: 1.39 mg/dL — ABNORMAL HIGH (ref 0.44–1.00)
GFR calc Af Amer: 42 mL/min — ABNORMAL LOW (ref >60)
GFR calc non Af Amer: 36 mL/min — ABNORMAL LOW (ref >60)
Glucose, Bld: 133 mg/dL — ABNORMAL HIGH (ref 65–99)
Potassium: 4.1 mmol/L (ref 3.5–5.1)
Sodium: 139 mmol/L (ref 135–145)
Total Bilirubin: 0.3 mg/dL (ref 0.3–1.2)
Total Protein: 6.2 g/dL — ABNORMAL LOW (ref 6.5–8.1)

## 2017-03-09 MED ORDER — FUROSEMIDE 20 MG PO TABS: 20.0000 mg | ORAL_TABLET | Freq: Every day | ORAL | Status: DC

## 2017-03-09 NOTE — Progress Notes (Signed)
Shelby Arn, MD Physician Signed Physical Medicine and Rehabilitation  Consult Note Date of Service: 03/07/2017 1:49 PM  Related encounter: ED to Hosp-Admission (Discharged) from 03/06/2017 in [facility] 4E CV SURGICAL PROGRESSIVE CARE     Expand All Collapse All   [] Hide copied text [] Hover for attribution information      Physical Medicine and Rehabilitation Consult Reason for Consult: Increased lower extremity weakness with multiple falls Referring Physician: Dr. Annette Stable   HPI: [name] is a 76 y.o. right handed female with history of bilateral hip replacement,  bilateral frontal burr hole for evacuation of subdural hematoma in April 16 by Dr.Pool after recent fall. She was discharged to home 02/15/2017 with her daughter at minimal assist level using a walker and home health therapies . Her daughter works during the day. Presented 03/06/2017 with multiple falls and reports of increasing lower extremity weakness. There has been no loss of consciousness. Cranial CT reviewed, showing left SDH.  Per report, increased size of left extra-axial collection compatible with acute on chronic subdural hematoma. Increased left sided mass effect with now a 4 mm of left-to-right midline shift. Neurosurgery follow-up currently on Decadron protocol and conservative care. Occupational therapy evaluation completed with recommendations of physical medicine rehabilitation consult.   Review of Systems  Constitutional: Negative for chills and fever.  HENT: Negative for hearing loss.   Eyes: Negative for blurred vision and double vision.  Respiratory: Negative for cough and shortness of breath.   Cardiovascular: Negative for chest pain.  Gastrointestinal: Positive for constipation. Negative for nausea and vomiting.       GERD  Genitourinary: Negative for dysuria, flank pain and hematuria.  Musculoskeletal: Positive for falls, joint pain and myalgias.  Skin: Negative for rash.  Neurological:  Positive for weakness. Negative for seizures.  Psychiatric/Behavioral: Positive for depression.  All other systems reviewed and are negative.  Past Medical History:  Diagnosis Date  . Chronic venous insufficiency   . Colon, diverticulosis Sept. 2011   outpatient colonoscopy by Dr. Cristina Gong.  Need record  . DDD (degenerative disc disease), lumbosacral   . Depression   . DJD (degenerative joint disease), multiple sites    Low back pain worst  . GERD (gastroesophageal reflux disease)   . Gout   . Gout    Uric Acid level 4.2 on 300 allopurinol  . History of cervical cancer 1972  . Hx-TIA (transient ischemic attack) 05/13/2001   Right facial numbness  . Hyperlipidemia   . Hypertension   . Memory disorder 12/20/2016  . Mitral valve prolapse 10/27/1999   Subsequent 2D echo show normal mitral valve  . Osteoarthritis of hip    bilateral hips  . Ovarian cancer ([place]) 1972   S/P oophorectomy  . Psychosis   . Recurrent boils    History of MRSA skin infections with abscess  . Sensorineural hearing loss of both ears   . Subdural hematoma ([place]) 01/26/2017   bilateral        Past Surgical History:  Procedure Laterality Date  . ABDOMINAL HYSTERECTOMY     1972  . BURR HOLE Bilateral 02/12/2017   Procedure: Haskell Flirt;  Surgeon: Earnie Larsson, MD;  Location: [facility];  Service: Neurosurgery;  Laterality: Bilateral;  . CERVICAL DISCECTOMY  7/07   C5-C6  . JOINT REPLACEMENT     bilateral hip replacement  . LUMBAR DISC SURGERY     L5-S1 7/07  . LUMBAR LAMINECTOMY/DECOMPRESSION MICRODISCECTOMY N/A 02/02/2016   Procedure: L4-5 Decompression;  Surgeon: Thana Farr  Lorin Mercy, MD;  Location: [facility];  Service: Orthopedics;  Laterality: N/A;  . [place] for ovarian cancer         Family History  Problem Relation Age of Onset  . Diabetes Mother   . Heart disease Mother   . Hearing loss Mother   . Stroke Mother   . Post-traumatic stress disorder Father    . Schizophrenia Maternal Grandmother   . Diabetes Maternal Grandfather   . Other Brother     brother died in mental health hospital   Social History:  reports that she quit smoking about 3 years ago. Her smoking use included Cigarettes. She smoked 0.10 packs per day. She has never used smokeless tobacco. She reports that she does not drink alcohol or use drugs. Allergies:       Allergies  Allergen Reactions  . Aspirin Other (See Comments)    Bleeding from vagina   . Sulfamethoxazole-Trimethoprim Itching         Medications Prior to Admission  Medication Sig Dispense Refill  . allopurinol (ZYLOPRIM) 300 MG tablet Take 1 tablet (300 mg total) by mouth daily. 90 tablet 3  . amLODipine (NORVASC) 10 MG tablet Take 1 tablet (10 mg total) by mouth daily. 90 tablet 3  . ascorbic acid (VITAMIN C) 500 MG tablet Take 500 mg by mouth daily.    [name] atorvastatin (LIPITOR) 40 MG tablet Take 1 tablet (40 mg total) by mouth daily. 90 tablet 3  . buPROPion (WELLBUTRIN XL) 300 MG 24 hr tablet Take 1 tablet (300 mg total) by mouth every morning. 30 tablet 2  . fluocinonide cream (LIDEX) 0.05 % APPLY TO AFFECTED AREA TWICE DAILY 60 g 1  . fluticasone (FLONASE) 50 MCG/ACT nasal spray INSTILL 2 SPRAYS IN EACH NOSTRIL DAILY 16 g 4  . furosemide (LASIX) 20 MG tablet TAKE ONE TABLET BY MOUTH DAILY. MAY TAKE AN EXTRA ONE AS NEEDED ONCE DAILY IF SWELLING OR EDEMA IS BAD 45 tablet 11  . HYDROcodone-acetaminophen (NORCO/VICODIN) 5-325 MG tablet Take 1-2 tablets by mouth every 6 (six) hours as needed for moderate pain. 60 tablet 0  . hydrOXYzine (VISTARIL) 25 MG capsule Take 1 capsule (25 mg total) by mouth 2 (two) times daily. (Patient taking differently: Take 25 mg by mouth at bedtime. ) 60 capsule 2  . losartan (COZAAR) 100 MG tablet Take 1 tablet (100 mg total) by mouth daily. 30 tablet 11  . Multiple Vitamins-Minerals (ALIVE WOMENS ENERGY) TABS Take 1 tablet by mouth daily.     [name] nystatin  (MYCOSTATIN/NYSTOP) powder APPLY TO AFFECTED AREA TWICE DAILY 60 g 11  . OVER THE COUNTER MEDICATION Calciboost capsule: Take 1 capsule by mouth once a day    . oxybutynin (DITROPAN-XL) 5 MG 24 hr tablet Take 1 tablet (5 mg total) by mouth at bedtime. 90 tablet 3  . pantoprazole (PROTONIX) 40 MG tablet TAKE 1 TABLET BY MOUTH TWICE DAILY BEFORE A MEAL 180 tablet 3  . potassium chloride (MICRO-K) 10 MEQ CR capsule Take 1 capsule (10 mEq total) by mouth daily. 90 capsule 3  . traMADol (ULTRAM) 50 MG tablet TAKE 1 TABLET BY MOUTH TWICE DAILY AS NEEDED (Patient taking differently: Take 50 mg by mouth two times a day as needed for pain) 60 tablet 0  . docusate sodium (COLACE) 100 MG capsule Take 1 capsule (100 mg total) by mouth every 12 (twelve) hours. (Patient not taking: Reported on 03/06/2017) 20 capsule 0  Home: Home Living Family/patient expects to be discharged to:: Private residence Living Arrangements: Children Available Help at Discharge: Family, Available 24 hours/day (daughter) Type of Home: House Home Access: Ramped entrance, Stairs to enter [employer] of Steps: 4 Entrance Stairs-Rails: Right [place]: One level Bathroom Shower/Tub: Multimedia programmer: Lena: Environmental consultant - 4 wheels, [employer] - single point, Bedside commode, Walker - 2 wheels, Shower seat, Hand held shower head, Grab bars - tub/shower Additional Comments: has an aide 5 days a week for 2 hours   Functional History: Prior Function Level of Independence: Needs assistance Gait / Transfers Assistance Needed: pt has been using RW since admission in April, daughter reports LB weakness increasing ADL's / Homemaking Assistance Needed: daughter reports pt having difficulty with LB bathing and dressing, has been assisted for IADL since April Functional Status:  Mobility: Bed Mobility General bed mobility comments: pt received in the chair Transfers Overall transfer level: Needs  assistance Equipment used: Rolling walker (2 wheeled) Transfers: Sit to/from Stand, [employer] Transfers Sit to Stand: Min assist Stand pivot transfers: Min assist General transfer comment: verbal cues for hand placement, min assist for balance, pt blocking knees on chair for support  ADL: ADL Overall ADL's : Needs assistance/impaired Eating/Feeding: Independent, Sitting Grooming: Wash/dry hands, Wash/dry face, Sitting, Set up Upper Body Bathing: Set up, Sitting Lower Body Bathing: Maximal assistance, Sit to/from stand Upper Body Dressing : Set up, Sitting Lower Body Dressing: Maximal assistance, Sit to/from stand Toilet Transfer: Minimal assistance, Stand-pivot, RW Toilet Transfer Details (indicate cue type and reason): simulated to chair General ADL Comments: Pt unable to cross her foot over opposite knee. Poor balance in standing for pulling up pants. Pt blocks knees on chair for support in standing.  Cognition: Cognition Overall Cognitive Status: Impaired/Different from baseline Orientation Level: Oriented X4 Cognition Arousal/Alertness: Awake/alert Behavior During Therapy: WFL for tasks assessed/performed Overall Cognitive Status: Impaired/Different from baseline Area of Impairment: Memory, Safety/judgement, Following commands Memory: Decreased short-term memory Following Commands: Follows one step commands with increased time (and multimodal cues) Safety/Judgement: Decreased awareness of safety, Decreased awareness of deficits Problem Solving: Slow processing, Difficulty sequencing  Blood pressure 118/65, pulse 66, temperature 99.3 F (37.4 C), temperature source Oral, resp. rate 19, height 5\' 3"  (1.6 m), weight 90.2 kg (198 lb 13.7 oz), SpO2 98 %. Physical Exam  Vitals reviewed. Constitutional: She appears well-developed and well-nourished.  HENT:  Head: Normocephalic and atraumatic.  Eyes: Conjunctivae and EOM are normal.  Neck: Normal range of motion. Neck  supple. No thyromegaly present.  Cardiovascular: Normal rate and regular rhythm.   Respiratory: Effort normal and breath sounds normal. No respiratory distress.  GI: Soft. Bowel sounds are normal. She exhibits no distension.  Musculoskeletal: She exhibits no edema or tenderness.  Neurological: She is alert.  She makes good eye contact with examiner and follows basic commands.  She provides her name age and date of birth. Motor: B/l UE 5/5 proximal to distal B/l LE: 4/5 HF, KE, 5/5 ADF/PF  Skin: Skin is warm and dry.  Psychiatric: She has a normal mood and affect. Her behavior is normal.    Lab Results Last 24 Hours       Results for orders placed or performed during the hospital encounter of 03/06/17 (from the past 24 hour(s))  MRSA PCR Screening     Status: None   Collection Time: 03/06/17  6:21 PM  Result Value Ref Range   MRSA by PCR NEGATIVE NEGATIVE  Glucose, capillary  Status: Abnormal   Collection Time: 03/07/17  7:56 AM  Result Value Ref Range   Glucose-Capillary 143 (H) 65 - 99 mg/dL   Comment 1 Notify RN    Comment 2 Document in Chart       Imaging Results (Last 48 hours)  Ct Head Wo Contrast  Result Date: 03/06/2017 CLINICAL DATA:  Fall today.  Recent bilateral subdural hematomas EXAM: CT HEAD WITHOUT CONTRAST TECHNIQUE: Contiguous axial images were obtained from the base of the skull through the vertex without intravenous contrast. COMPARISON:  CT head without contrast 03/01/2017 FINDINGS: Brain: The left extra-axial collection has increased. Acute blood products are noted. Mixed density collections are present suggesting acute on chronic hemorrhage. The collection measures 18 mm maximally on the coronal images. There is further extension of the left temporal lobe. Previously noted leftward midline shift has changed. There is now 4 mm of left to right midline shift. The mixed attenuation right subdural collection is stable. No acute cortical infarct is  present. The ventricles are compressed bilaterally. There is no hydrocephalus. Brainstem and cerebellum are normal. Vascular: Atherosclerotic calcifications are present within the cavernous internal carotid arteries bilaterally. There is no hyperdense vessel. Skull: Bilateral frontal burr holes are present. Sinuses/Orbits: The paranasal sinuses and mastoid air cells are clear. The globes and orbits are within normal limits. IMPRESSION: 1. Increased size of left extra-axial collection compatible with acute on chronic subdural hematoma. 2. Increased left-sided mass effect with now 4 mm of left to right midline shift. 3. Stable appearance of mixed attenuation right subdural hematoma. These results were called by telephone at the time of interpretation on 03/06/2017 at 1:06 pm to Dr. Gareth Morgan , who verbally acknowledged these results. Electronically Signed   By: San Morelle M.D.   On: 03/06/2017 13:08   Dg Hip Unilat With Pelvis 2-3 Views Left  Result Date: 03/06/2017 CLINICAL DATA:  Left hip pain after fall at home today. Initial encounter. EXAM: DG HIP (WITH OR WITHOUT PELVIS) 2-3V LEFT COMPARISON:  None. FINDINGS: Bilateral total hip arthroplasty. No evidence of fracture or loosening. No dislocation. The pelvic ring appears intact. Osteopenia and atherosclerosis. IMPRESSION: No acute finding. Electronically Signed   By: Monte Fantasia M.D.   On: 03/06/2017 15:09     Assessment/Plan: Diagnosis: Acute on chronic left SDH Labs and images independently reviewed.  Records reviewed and summated above.             [place] score:  >/VII             Cognitive therapy to direct modular abilities in order to maintain goals        including problem solving, self regulation/monitoring, self management, attention, and memory.             Fall precautions; pt at risk for second impact syndrome             Prevention of secondary injury: monitor for hypotension, hypoxia, seizures or signs of  increased ICP             Avoid medications that could impair cognitive abilities, such as anticholinergics, antihistaminic, benzodiazapines, narcotics, etc when possible  1. Does the need for close, 24 hr/day medical supervision in concert with the patient's rehab needs make it unreasonable for this patient to be served in a less intensive setting? Potentially 2. Co-Morbidities requiring supervision/potential complications: bilateral hip replacement (monitor pain), bilateral frontal burr hole for evacuation of subdural hematoma, steroid induced hyperglycemia (Monitor in accordance  with exercise and adjust meds as necessary), elevated blood pressure (cont to monitor), ABLA (transfuse if necessary to ensure appropriate perfusion for increased activity tolerance), CKD (avoid nephrotoxic meds) 3. Due to safety and patient education, does the patient require 24 hr/day rehab nursing? Potentially 4. Does the patient require coordinated care of a physician, rehab nurse, PT (1-2 hrs/day, 5 days/week) and OT (1-2 hrs/day, 5 days/week) to address physical and functional deficits in the context of the above medical diagnosis(es)? Potentially Addressing deficits in the following areas: balance, endurance, locomotion, strength, transferring, toileting and psychosocial support 5. Can the patient actively participate in an intensive therapy program of at least 3 hrs of therapy per day at least 5 days per week? Yes 6. The potential for patient to make measurable gains while on inpatient rehab is good and fair 7. Anticipated functional outcomes upon discharge from inpatient rehab are supervision and min assist  with PT, supervision and min assist with OT, supervision with SLP. 8. Estimated rehab length of stay to reach the above functional goals is: 7-10 days. 9. Does the patient have adequate social supports and living environment to accommodate these discharge functional goals? Potentially 10. Anticipated D/C  setting: Home 11. Anticipated post D/C treatments: HH therapy and Home excercise program 12. Overall Rehab/Functional Prognosis: good  RECOMMENDATIONS: This patient's condition is appropriate for continued rehabilitative care in the following setting: Pt appears to be at/near baseline level of functioning.  If this is the case and family cannot care for her at discharge recommend SNF as pt will need supervision at discharge.   Patient has agreed to participate in recommended program. Potentially Note that insurance prior authorization may be required for reimbursement for recommended care.  Comment: Rehab Admissions Coordinator to follow up.  Delice Lesch, MD, Mellody Drown Cathlyn Parsons., PA-C 03/07/2017

## 2017-03-09 NOTE — Care Management Note (Signed)
[facility] Individual Statement of Services  Patient Name:  [name]  Date:  03/09/2017  Welcome to the [facility].  Our goal is to provide you with an individualized program based on your diagnosis and situation, designed to meet your specific needs.  With this comprehensive rehabilitation program, you will be expected to participate in at least 3 hours of rehabilitation therapies Monday-Friday, with modified therapy programming on the weekends.  Your rehabilitation program will include the following services:  Physical Therapy (PT), Occupational Therapy (OT), Speech Therapy (ST), 24 hour per day rehabilitation nursing, Therapeutic Recreaction (TR), Neuropsychology, Case Management (Social Worker), Rehabilitation Medicine, Nutrition Services and Pharmacy Services  Weekly team conferences will be held on Tuesdays to discuss your progress.  Your Social Worker will talk with you frequently to get your input and to update you on team discussions.  Team conferences with you and your family in attendance may also be held.  Expected length of stay: 9-11 days  Overall anticipated outcome: supervision  Depending on your progress and recovery, your program may change. Your Social Worker will coordinate services and will keep you informed of any changes. Your Social Worker's name and contact numbers are listed  below.  The following services may also be recommended but are not provided by the [facility] will be made to provide these services after discharge if needed.  Arrangements include referral to agencies that provide these services.  Your insurance has been verified to be:  [employer], Medicaid Your primary doctor is:  Dr. Larey Dresser  Pertinent information will be shared with your doctor and  your insurance company.  Social Worker:  [place], [place] or ([phone]   Information discussed with and copy given to patient by: [name], 03/09/2017, 2:36 PM

## 2017-03-09 NOTE — Progress Notes (Signed)
[name] Rehab Admission Coordinator Signed Physical Medicine and Rehabilitation  PMR Pre-admission Date of Service: 03/08/2017 3:29 PM  Related encounter: ED to Hosp-Admission (Discharged) from 03/06/2017 in [facility] 4E CV SURGICAL PROGRESSIVE CARE       [] Hide copied text PMR Admission Coordinator Pre-Admission Assessment  Patient: Shelby WOOLWORTH is an 76 y.o., female MRN: [id number] DOB: [birth year] Height: 5\' 3"  (160 cm) Weight: 90.2 kg (198 lb 13.7 oz)                                                                                                                                                  Insurance Information HMO:     PPO: X     PCP:      IPA:      80/20:      OTHER:  PRIMARY: [employer]       Policy#: [id number]      Subscriber: Self CM Name: Eula Listen       Phone#: [phone]     Fax#: [phone] Pre-Cert#: [id number] for 7 days; EMR access for reviews      Employer: Retired  Benefits:  Phone #: [phone]     Name: automated phone line  Eff. Date: 10/31/15     Deduct: $0      Out of Pocket Max: $3,400      Life Max: N/A CIR: $250 a day, days 1-6 then $0 a day      SNF: $0 a day, days 1-20; $150 a day, days 21-100 Outpatient: PT/OT/SLP necessity      Co-Pay:  $15 a visit  Home Health: PT/OT/SLP necessity      Co-Pay: $25 a visit  DME: 80%     Co-Pay: 20% Providers: in network   SECONDARY: Medicaid of [place]      Policy#: [id number]      Subscriber: Self CM Name:       Phone#:      Fax#:  Pre-Cert#: [id number]      Employer: Retired  Dealer:  Phone #: [phone]     Name:  Eff. Date: eligible as of 03/08/17     Deduct:       Out of Pocket Max:       Life Max:  CIR:       SNF:  Outpatient:      Co-Pay:  Home Health:       Co-Pay:  DME:      Co-Pay:   Medicaid Application Date:       Case Manager:  Disability Application Date:       Case Worker:   Emergency Contact Information        Contact Information    Name Relation Home Work Mobile   Poole,[name]  Daughter [phone]       Current Medical History  Patient Admitting Diagnosis:  Acute on chronic left SDH  History of Present Illness: Shelby Mcwethy. McKinneyis a 76 y.o.right handed femalewith history ofbilateral hip replacement,bilateral frontal burr hole for evacuation of subdural hematoma in April 16 by Dr.Poolafter recent fall. She was discharged to home 02/15/2017 with her daughter at minimal assist level using a walkerand home health therapies.Her daughter works during the day and her niece plans to assist as needed.Presented 03/06/2017 with multiple falls and reports of increasing lower extremity weakness. There has been no loss of consciousness. Cranial CT reviewed, showing left SDH. Per report, increased size of left extra-axial collection compatible with acute on chronic subdural hematoma. Increased left sided mass effect with now a 4 mm of left-to-right midline shift. Neurosurgery follow-up currently on Decadron protocol and conservative care. Occupational and physical therapy recommending physical medicine rehabilitation consult. Patient was admitted for comprehensive rehabilitation program 03/08/17.  Past Medical History  Past Medical History:  Diagnosis Date  . Chronic venous insufficiency   . Colon, diverticulosis Sept. 2011   outpatient colonoscopy by Dr. Cristina Gong.  Need record  . DDD (degenerative disc disease), lumbosacral   . Depression   . DJD (degenerative joint disease), multiple sites    Low back pain worst  . GERD (gastroesophageal reflux disease)   . Gout   . Gout    Uric Acid level 4.2 on 300 allopurinol  . History of cervical cancer 1972  . Hx-TIA (transient ischemic attack) 05/13/2001   Right facial numbness  . Hyperlipidemia   . Hypertension   . Memory disorder 12/20/2016  . Mitral valve prolapse 10/27/1999   Subsequent 2D echo show normal mitral valve  . Osteoarthritis of hip    bilateral hips  . Ovarian cancer ([place]) 1972   S/P  oophorectomy  . Psychosis   . Recurrent boils    History of MRSA skin infections with abscess  . Sensorineural hearing loss of both ears   . Subdural hematoma ([place]) 01/26/2017   bilateral    Family History  family history includes Diabetes in her maternal grandfather and mother; Hearing loss in her mother; Heart disease in her mother; Other in her brother; Post-traumatic stress disorder in her father; Schizophrenia in her maternal grandmother; Stroke in her mother.  Prior Rehab/Hospitalizations:  Has the patient had major surgery during 100 days prior to admission? Yes  Current Medications   Current Facility-Administered Medications:  .  0.9 %  sodium chloride infusion, 250 mL, Intravenous, PRN, Costella, Vincent J, PA-C .  0.9 %  sodium chloride infusion, , Intravenous, Continuous, Costella, Vincent J, PA-C .  acetaminophen (TYLENOL) tablet 650 mg, 650 mg, Oral, Q6H PRN, 650 mg at 03/06/17 1827 **OR** acetaminophen (TYLENOL) suppository 650 mg, 650 mg, Rectal, Q6H PRN, Costella, Vincent J, PA-C .  allopurinol (ZYLOPRIM) tablet 300 mg, 300 mg, Oral, Daily, Costella, Vincent J, PA-C, 300 mg at 03/08/17 0946 .  amLODipine (NORVASC) tablet 10 mg, 10 mg, Oral, Daily, Costella, Vincent J, PA-C, 10 mg at 03/08/17 0947 .  atorvastatin (LIPITOR) tablet 40 mg, 40 mg, Oral, Daily, Costella, Vincent J, PA-C, 40 mg at 03/08/17 0946 .  bisacodyl (DULCOLAX) suppository 10 mg, 10 mg, Rectal, Daily PRN, Costella, Vincent J, PA-C .  buPROPion (WELLBUTRIN XL) 24 hr tablet 300 mg, 300 mg, Oral, q morning - 10a, Costella, Vincent J, PA-C, 300 mg at 03/08/17 0946 .  dexamethasone (DECADRON) tablet 4 mg, 4 mg, Oral, Q6H, Pool, Mallie Mussel, MD, 4 mg at 03/08/17 1357 .  docusate sodium (COLACE) capsule 100 mg, 100  mg, Oral, BID, Costella, Vista Mink, PA-C, 100 mg at 03/08/17 0947 .  furosemide (LASIX) tablet 20 mg, 20 mg, Oral, Daily, Costella, Vincent J, PA-C, 20 mg at 03/08/17 0947 .   HYDROcodone-acetaminophen (NORCO/VICODIN) 5-325 MG per tablet 1-2 tablet, 1-2 tablet, Oral, Q4H PRN, Costella, Vista Mink, PA-C, 2 tablet at 03/07/17 1748 .  hydrOXYzine (ATARAX/VISTARIL) tablet 25 mg, 25 mg, Oral, QHS, Costella, Vincent J, PA-C, 25 mg at 03/07/17 2219 .  losartan (COZAAR) tablet 100 mg, 100 mg, Oral, Daily, Costella, Vincent J, PA-C, 100 mg at 03/08/17 0947 .  magnesium citrate solution 1 Bottle, 1 Bottle, Oral, Once PRN, Costella, Vincent J, PA-C .  ondansetron (ZOFRAN) tablet 4 mg, 4 mg, Oral, Q6H PRN **OR** ondansetron (ZOFRAN) injection 4 mg, 4 mg, Intravenous, Q6H PRN, Costella, Vincent J, PA-C .  oxybutynin (DITROPAN-XL) 24 hr tablet 5 mg, 5 mg, Oral, QHS, Costella, Vincent J, PA-C, 5 mg at 03/07/17 2219 .  pantoprazole (PROTONIX) EC tablet 20 mg, 20 mg, Oral, BID, Costella, Vincent J, PA-C, 20 mg at 03/08/17 0945 .  polyethylene glycol (MIRALAX / GLYCOLAX) packet 17 g, 17 g, Oral, Daily PRN, Costella, Vincent J, PA-C .  potassium chloride (K-DUR,KLOR-CON) CR tablet 10 mEq, 10 mEq, Oral, Daily, Costella, Vincent J, PA-C, 10 mEq at 03/08/17 0946 .  sodium chloride flush (NS) 0.9 % injection 3 mL, 3 mL, Intravenous, Q12H, Costella, Vincent J, PA-C, 3 mL at 03/08/17 0947 .  sodium chloride flush (NS) 0.9 % injection 3 mL, 3 mL, Intravenous, PRN, Costella, Vista Mink, PA-C .  traMADol (ULTRAM) tablet 50 mg, 50 mg, Oral, BID PRN, Costella, Vincent J, PA-C, 50 mg at 03/06/17 1525 .  zolpidem (AMBIEN) tablet 5 mg, 5 mg, Oral, QHS PRN, Costella, Vista Mink, PA-C  Patients Current Diet: Diet Heart Room service appropriate? Yes; Fluid consistency: Thin  Precautions / Restrictions Precautions Precautions: (P) Fall Restrictions Weight Bearing Restrictions: No   Has the patient had 2 or more falls or a fall with injury in the past year?Yes, 5 in the last month with final fall resulting in this hospitalization  Prior Activity Level Limited Community (1-2x/wk): Prior to [place]  patient was independent at home for basic self-care tasks except for aide to helping with bathing.  In April patient sustained a fall and went home with therapies.  She has not progressed beyond a Min A level and was having multiple falls.  Patient want to get back to being independent as possible in the house.  Prior to all this she enjoyed visiting with her priest and going out to eat with her friend.    Home Assistive Devices / Equipment Home Assistive Devices/Equipment: Bathtub lift, Built-in shower seat, Cane (specify quad or straight), Eyeglasses, Other (Comment), Tub transfer bench, Walker (specify type), Hearing aid, Blood pressure cuff Home Equipment: Walker - 4 wheels, Cane - single point, Bedside commode, Walker - 2 wheels, Shower seat, Hand held shower head, Grab bars - tub/shower  Prior Device Use: Indicate devices/aids used by the patient prior to current illness, exacerbation or injury? Motorized wheelchair or scooter and Environmental consultant  Prior Functional Level Prior Function Level of Independence: Needs assistance Gait / Transfers Assistance Needed: pt has been using RW since admission in April, daughter reports LB weakness increasing ADL's / Homemaking Assistance Needed: daughter reports pt having difficulty with LB bathing and dressing, has been assisted for IADL since April  Self Care: Did the patient need help bathing, dressing, using the toilet or eating? Needed  some help  Indoor Mobility: Did the patient need assistance with walking from room to room (with or without device)? Needed some help  Stairs: Did the patient need assistance with internal or external stairs (with or without device)? Dependent  Functional Cognition: Did the patient need help planning regular tasks such as shopping or remembering to take medications? Needed some help  Current Functional Level Cognition  Overall Cognitive Status: (P) Impaired/Different from baseline Current Attention Level: (P)  Sustained Orientation Level: Oriented X4 Following Commands: (P) Follows one step commands inconsistently, Follows one step commands with increased time Safety/Judgement: (P) Decreased awareness of safety, Decreased awareness of deficits General Comments: (P) Pt reports "I cant" frequently when asked to participate in functional activities. Needs cues for initiation and sequencing of BADL.    Extremity Assessment (includes Sensation/Coordination)  Upper Extremity Assessment: Overall WFL for tasks assessed  Lower Extremity Assessment: RLE deficits/detail, LLE deficits/detail RLE Deficits / Details: strength--hip flex 2+/5, quads 3+, hams 3+, df/pf 3- ;  pt slow to follow sequencing needing multimodal cues.  Notably weaker than L LE RLE Coordination: decreased fine motor LLE Deficits / Details: strength:  hip flex 3/5, quads, 4-, hams, 4-, df/pf 3- LLE Coordination: decreased fine motor    ADLs  Overall ADL's : (P) Needs assistance/impaired Eating/Feeding: Independent, Sitting Grooming: Wash/dry hands, Wash/dry face, Sitting, Set up Upper Body Bathing: Set up, Sitting Lower Body Bathing: Maximal assistance, Sit to/from stand Upper Body Dressing : Set up, Sitting Lower Body Dressing: Maximal assistance, Sit to/from stand Toilet Transfer: Minimal assistance, Stand-pivot, RW Toilet Transfer Details (indicate cue type and reason): simulated to chair General ADL Comments: Pt unable to cross her foot over opposite knee. Poor balance in standing for pulling up pants. Pt blocks knees on chair for support in standing.    Mobility  General bed mobility comments: pt received in the chair    Transfers  Overall transfer level: Needs assistance Equipment used: Rolling walker (2 wheeled) Transfers: Sit to/from Stand Sit to Stand: Min assist Stand pivot transfers: Min assist General transfer comment: verbal cues for hand placement and setting up for standing, min assist for balance, pt  blocking knees on chair for support    Ambulation / Gait / Stairs / Wheelchair Mobility  Ambulation/Gait Ambulation/Gait assistance: Min assist (with mod multimodal cues) Ambulation Distance (Feet): 8 Feet (x2) Assistive device: Rolling walker (2 wheeled) Gait Pattern/deviations: Step-to pattern, Step-through pattern, Decreased step length - right, Decreased step length - left, Decreased stance time - left, Decreased stride length General Gait Details: Very degraded gait pattern from previous admission--pt with very wide BOS, made 2-4 ince steps bilaterally with straight knees and rigid posture.  Started trying to break down pattern and transition to a more flexed knee, toe off pattern which was very difficult for pt. Gait velocity: slow Gait velocity interpretation: Below normal speed for age/gender    Posture / Balance Dynamic Sitting Balance Sitting balance - Comments: unsafe access feet in sitting Balance Overall balance assessment: Needs assistance Sitting balance-Leahy Scale: Fair Sitting balance - Comments: unsafe access feet in sitting Standing balance support: Bilateral upper extremity supported Standing balance-Leahy Scale: Poor Standing balance comment: UE support needed for balance    Special needs/care consideration BiPAP/CPAP: No CPM: No Continuous Drip IV: No Dialysis: No        Life Vest: No Oxygen: No Special Bed: No Trach Size: No Wound Vac (area): No       Skin: Bruising of legs and butt  Bowel mgmt: 03/08/17 continent  Bladder mgmt: Intermittent continence  Diabetic mgmt: No     Previous Home Environment Living Arrangements: Children Available Help at Discharge: Family, Available 24 hours/day (daughter) Type of Home: House Home Layout: One level Home Access: Ramped entrance, Stairs to enter Entrance Stairs-Rails: Right Entrance Stairs-Number of Steps: 4 Bathroom Shower/Tub: Multimedia programmer:  [place]: Yes Type of Home Care Services: Homehealth aide, Home PT, Home RN, [place] (if known): [facility] Additional Comments: has an aide 5 days a week for 2 hours   Discharge Living Setting Plans for Discharge Living Setting: Patient's home, Alone Type of Home at Discharge: House Discharge Home Layout: One level Discharge Home Access: [place] entrance Discharge Bathroom Shower/Tub: Walk-in shower Discharge Bathroom Toilet: Handicapped height Discharge Bathroom Accessibility: Yes How Accessible: Accessible via walker Does the patient have any problems obtaining your medications?: No  Social/Family/Support Systems Patient Roles: Parent, Other (Comment) (grandparent ) Contact Information: Daughter: [name] [phone] Anticipated Caregiver: Daughter and granddaughter  Anticipated Caregiver's Contact Information: see above  Ability/Limitations of Caregiver: when daughter works granddaughter will be home Caregiver Availability: 24/7 Discharge Plan Discussed with Primary Caregiver: Yes Is Caregiver In Agreement with Plan?: Yes Does Caregiver/Family have Issues with Lodging/Transportation while Pt is in Rehab?: No  Goals/Additional Needs Patient/Family Goal for Rehab: PT/OT Min-Supervision; SLP Supervision  Expected length of stay: 7-10 days Cultural Considerations: Catholic  Dietary Needs: Heart Healthy restrictions  Equipment Needs: TBD Special Service Needs: None Additional Information: Patient had an aide at home for 2-3 hours a day 4 days a week  Pt/Family Agrees to Admission and willing to participate: Yes Program Orientation Provided & Reviewed with Pt/Caregiver Including Roles  & Responsibilities: Yes Additional Information Needs: None Information Needs to be Provided By: N/A   Decrease burden of Care through IP rehab admission: No  Possible need for SNF placement upon discharge: No  Patient Condition: This  patient's medical and functional status has changed since the consult dated 03/07/17 in which the Rehabilitation Physician determined and documented that the patient was potentially appropriate for intensive rehabilitative care in an inpatient rehabilitation facility. Issues have been addressed and update has been discussed with Dr. Posey Pronto and Letta Pate and patient now appropriate for inpatient rehabilitation. Will admit to inpatient rehab today.   Preadmission Screen Completed By:  [name], 03/08/2017 3:29 PM ______________________________________________________________________   Discussed status with Dr. Letta Pate on 03/08/17 at 73 and received telephone approval for admission today.  Admission Coordinator:  [name], time 1600/Date 03/08/17       Cosigned by: Charlett Blake, MD at 03/08/2017 4:19 PM  Revision History

## 2017-03-09 NOTE — IPOC Note (Signed)
Overall Plan of Care [facility]) Patient Details Name: [name] MRN: [id number] DOB: 25-Sep-1941  Admitting Diagnosis: Acute on Chronic Traumatic SDH After FAll  Hospital Problems: Active Problems:   Essential hypertension   Chronic kidney disease (CKD), stage III (moderate)   Subdural hemorrhage following injury without open intracranial wound and with loss of consciousness of 30 minutes or less, sequela (HCC)     Functional Problem List: Nursing Bladder, Bowel, Edema, Endurance, Medication Management, Nutrition, Perception, Safety, Skin Integrity  PT Balance, Behavior, Endurance, Motor, Safety, Perception, Sensory, Skin Integrity  OT Balance, Cognition, Endurance, Motor, Safety, Pain  SLP Cognition  TR         Basic ADL's: OT Grooming, Bathing, Dressing, Toileting     Advanced  ADL's: OT       Transfers: PT Bed Mobility, Bed to Chair, Car, Manufacturing systems engineer, Metallurgist: PT Ambulation, Emergency planning/management officer, Stairs     Additional Impairments: OT None  SLP Social Cognition   Problem Solving, Memory, Awareness  TR      Anticipated Outcomes Item Anticipated Outcome  Self Feeding Mod I  Swallowing      Basic self-care  Media planner Transfers Supervision- min A  Bowel/Bladder  Min assist  Transfers  supervision with LRAD  Locomotion  supervision with LRAD for household distances  Communication     Cognition  supervision   Pain  <3 on a 0-10 pain scale  Safety/Judgment  min assist    Therapy Plan: PT Intensity: Minimum of 1-2 x/day ,45 to 90 minutes PT Frequency: 5 out of 7 days PT Duration Estimated Length of Stay: 7-10 days OT Intensity: Minimum of 1-2 x/day, 45 to 90 minutes OT Frequency: 5 out of 7 days OT Duration/Estimated Length of Stay: 9-11 days SLP Intensity: Minumum of 1-2 x/day, 30 to 90 minutes SLP Frequency: 3 to 5 out of 7 days SLP Duration/Estimated Length of Stay: 9-11 days         Team Interventions: Nursing Interventions Patient/Family Education, Bladder Management, Bowel Management, Disease Management/Prevention, Medication Management, Skin Care/Wound Management, Discharge Planning, Psychosocial Support  PT interventions Ambulation/gait training, DME/adaptive equipment instruction, Neuromuscular re-education, Psychosocial support, Stair training, UE/LE Strength taining/ROM, Wheelchair propulsion/positioning, UE/LE Coordination activities, Therapeutic Activities, Pain management, Discharge planning, Balance/vestibular training, Cognitive remediation/compensation, Disease management/prevention, Functional mobility training, Patient/family education, Therapeutic Exercise, Visual/perceptual remediation/compensation  OT Interventions Balance/vestibular training, Cognitive remediation/compensation, Discharge planning, Community reintegration, DME/adaptive equipment instruction, Functional mobility training, Pain management, Patient/family education, Psychosocial support, Self Care/advanced ADL retraining, Therapeutic Activities, Therapeutic Exercise, UE/LE Strength taining/ROM, UE/LE Coordination activities  SLP Interventions Cognitive remediation/compensation, Cueing hierarchy, Environmental controls, Internal/external aids, Patient/family education  TR Interventions    SW/CM Interventions Discharge Planning, Psychosocial Support, Patient/Family Education    Team Discharge Planning: Destination: PT-Home ,OT- Home , SLP-Home Projected Follow-up: PT-Home health PT, 24 hour supervision/assistance, OT-  Home health OT, SLP-24 hour supervision/assistance, Home Health SLP, Outpatient SLP Projected Equipment Needs: PT-To be determined, OT- To be determined, SLP-None recommended by SLP Equipment Details: PT- , OT-  Patient/family involved in discharge planning: PT- Patient,  OT-Patient, Family member/caregiver, SLP-Patient, Family member/caregiver  MD ELOS: 8-10 days Medical  Rehab Prognosis:  Excellent Assessment: The patient has been admitted for CIR therapies with the diagnosis of acute on chronic traumatic SDH. The team will be addressing functional mobility, strength, stamina, balance, safety, adaptive techniques and equipment, self-care, bowel and bladder mgt, patient and caregiver education, NMR, visual-spatial awareness,  cognition, behavior, activity tolerance. Goals have been set at supervision to min assist with basic self-care, transfers, mobility, ADL's and cognition.    Meredith Staggers, MD, FAAPMR      See Team Conference Notes for weekly updates to the plan of care

## 2017-03-09 NOTE — Evaluation (Signed)
Occupational Therapy Assessment and Plan  Patient Details  Name: [name] MRN: [id number] Date of Birth: 02-Oct-1941  OT Diagnosis: abnormal posture, cognitive deficits and muscle weakness (generalized) Rehab Potential: Rehab Potential (ACUTE ONLY): Good ELOS: 9-11 days   Today's Date: 03/09/2017 OT Individual Time: 6812-7517 OT Individual Time Calculation (min): 60 min     Problem List:  Patient Active Problem List   Diagnosis Date Noted  . Subdural hemorrhage following injury without open intracranial wound and with loss of consciousness of 30 minutes or less, sequela ([place]) 03/08/2017  . Fall   . TBI (traumatic brain injury) ([place])   . History of bilateral hip replacements   . Steroid-induced hyperglycemia   . Acute blood loss anemia   . Elevated blood pressure reading   . Stage 3 chronic kidney disease   . Subdural hematoma ([place]) 03/06/2017  . SDH (subdural hematoma) ([place]) 02/10/2017  . Subdural hematoma without coma ([place]) 02/09/2017  . Acute low back pain 02/04/2017  . Memory disorder 12/20/2016  . History of lumbar laminectomy for spinal cord decompression 02/02/2016  . Atopic dermatitis 07/09/2015  . Bereavement 12/01/2014  . Psychosis 12/01/2014  . Reactive airway disease 08/11/2014  . History of cancer 08/11/2014  . OSA (obstructive sleep apnea) 08/11/2014  . Healthcare maintenance 07/17/2013  . Chronic kidney disease (CKD), stage III (moderate) 10/04/2012  . Schizophrenia, chronic condition ([place]) 01/20/2012  . Postmenopausal atrophic vaginitis 11/01/2010  . Chronic diastolic heart failure ([place]) 05/06/2010  . Allergic rhinitis 10/14/2009  . Obesity, Class II, BMI 35-39.9, with comorbidity 05/14/2009  . Chronic prescription opiate use 04/29/2008  . Normocytic anemia 09/25/2007  . Hyperlipidemia 09/03/2006  . Gout 09/03/2006  . Essential hypertension 09/03/2006  . GERD 09/03/2006  . Hx-TIA (transient ischemic attack) 05/13/2001    Past Medical History:   Past Medical History:  Diagnosis Date  . Chronic venous insufficiency   . Colon, diverticulosis Sept. 2011   outpatient colonoscopy by Dr. Cristina Gong.  Need record  . DDD (degenerative disc disease), lumbosacral   . Depression   . DJD (degenerative joint disease), multiple sites    Low back pain worst  . GERD (gastroesophageal reflux disease)   . Gout   . Gout    Uric Acid level 4.2 on 300 allopurinol  . History of cervical cancer 1972  . Hx-TIA (transient ischemic attack) 05/13/2001   Right facial numbness  . Hyperlipidemia   . Hypertension   . Memory disorder 12/20/2016  . Mitral valve prolapse 10/27/1999   Subsequent 2D echo show normal mitral valve  . Osteoarthritis of hip    bilateral hips  . Ovarian cancer ([place]) 1972   S/P oophorectomy  . Psychosis   . Recurrent boils    History of MRSA skin infections with abscess  . Sensorineural hearing loss of both ears   . Subdural hematoma ([place]) 01/26/2017   bilateral   Past Surgical History:  Past Surgical History:  Procedure Laterality Date  . ABDOMINAL HYSTERECTOMY     1972  . BURR HOLE Bilateral 02/12/2017   Procedure: Haskell Flirt;  Surgeon: Earnie Larsson, MD;  Location: [facility];  Service: Neurosurgery;  Laterality: Bilateral;  . CERVICAL DISCECTOMY  7/07   C5-C6  . JOINT REPLACEMENT     bilateral hip replacement  . LUMBAR DISC SURGERY     L5-S1 7/07  . LUMBAR LAMINECTOMY/DECOMPRESSION MICRODISCECTOMY N/A 02/02/2016   Procedure: L4-5 Decompression;  Surgeon: Marybelle Killings, MD;  Location: [facility];  Service: Orthopedics;  Laterality: N/A;  . OOPHORECTOMY     1972 for ovarian cancer    Assessment & Plan Clinical Impression: Shelby Spira McKinneyis a 76 y.o.right handed femalewith history ofbilateral hip replacement,bilateral frontal burr hole for evacuation of subdural hematoma in April 16 by Dr.Poolafter recent fall. She was discharged to home 02/15/2017 with her daughter at minimal assist level using a walkerand home health  therapies .Her daughter works during the day and her niece plans to assist as needed .Presented 03/06/2017 with multiple falls and reports of increasing lower extremity weakness. There has been no loss of consciousness. Cranial CT reviewed, showing left SDH. Per report, increased size of left extra-axial collection compatible with acute on chronic subdural hematoma. Increased left sided mass effect with now a 4 mm of left-to-right midline shift. Neurosurgery follow-up currently on Decadron protocol and conservative care. Occupational therapy evaluation completed with recommendations of physical medicine rehabilitation consult. Patient was admitted for comprehensive rehabilitation program Patient transferred to CIR on 03/08/2017 .    Patient currently requires mod with basic self-care skills secondary to muscle weakness, decreased cardiorespiratoy endurance, decreased coordination, decreased attention, decreased awareness, decreased problem solving, decreased safety awareness, decreased memory and delayed processing and decreased sitting balance, decreased standing balance, decreased postural control and decreased balance strategies.  Prior to hospitalization, patient could complete ADLs with modified independent .  Patient will benefit from skilled intervention to decrease level of assist with basic self-care skills and increase independence with basic self-care skills prior to discharge home with care partner.  Anticipate patient will require 24 hour supervision and follow up home health.  OT - End of Session Activity Tolerance: Tolerates 10 - 20 min activity with multiple rests Endurance Deficit: Yes Endurance Deficit Description: Required rest breaks throughout bathing/dressing session OT Assessment Rehab Potential (ACUTE ONLY): Good OT Patient demonstrates impairments in the following area(s): Balance;Cognition;Endurance;Motor;Safety;Pain OT Basic ADL's Functional Problem(s):  Grooming;Bathing;Dressing;Toileting OT Transfers Functional Problem(s): Toilet;Tub/Shower OT Additional Impairment(s): None OT Plan OT Intensity: Minimum of 1-2 x/day, 45 to 90 minutes OT Frequency: 5 out of 7 days OT Duration/Estimated Length of Stay: 9-11 days OT Treatment/Interventions: Balance/vestibular training;Cognitive remediation/compensation;Discharge planning;Community reintegration;DME/adaptive equipment instruction;Functional mobility training;Pain management;Patient/family education;Psychosocial support;Self Care/advanced ADL retraining;Therapeutic Activities;Therapeutic Exercise;UE/LE Strength taining/ROM;UE/LE Coordination activities OT Self Feeding Anticipated Outcome(s): Mod I OT Basic Self-Care Anticipated Outcome(s): Supervision OT Toileting Anticipated Outcome(s): Supervision OT Bathroom Transfers Anticipated Outcome(s): Supervision- min A OT Recommendation Patient destination: Home Follow Up Recommendations: Home health OT Equipment Recommended: To be determined   Skilled Therapeutic Intervention Pt seen for OT eval and ADL bathing/dressing session. Pt in recliner upon arrival, agreeable to tx session, denied pain. Pt's daughter present throughout session.  She completed stand pivot transfers throughout session with hand held assist and max cuing for sequencing of transfer.  She bathed seated on tub bench with steadying assist and heavy reliance on grab bars during standing portions to complete buttock/ pericare hygiene.  She dressed seated in w/c, stood at sink with max A standing balance while pt attempted to pull up pants. Pt returned to recliner at end of session, left seated with all needs in reach and daughter present. RN made aware of pt's position. Educated throughout session regarding role of OT, POC, OT goals, CIR, continuum of care and d/c planning  OT Evaluation Precautions/Restrictions  Precautions Precautions: Fall Restrictions Weight Bearing  Restrictions: No General Chart Reviewed: Yes Additional Pertinent History: hx B hip replacements Pain Pain Assessment Pain Assessment: No/denies pain Home Living/Prior Functioning Home Living Family/patient expects  to be discharged to:: Private residence Living Arrangements: Children Available Help at Discharge: Family, Available 24 hours/day (Daughter works during day, granddaughter to provide assist during the day) Type of Home: House Home Access: Ramped entrance, Stairs to enter [employer] of Steps: 4 Entrance Stairs-Rails: Right [place]: One level Bathroom Shower/Tub: Multimedia programmer: Standard Bathroom Accessibility: Yes Additional Comments: has an aide 5 days a week for 2 hours   Lives With: Alone (Daughter had  recently moved in with her to provide assist) IADL History Current License: No Education: some college  Prior Function Vocation: Retired Surveyor, mining Baseline Vision/History: Wears glasses;Cataracts Wears Glasses: Reading only Patient Visual Report: No change from baseline Cognition Overall Cognitive Status: Impaired/Different from baseline Arousal/Alertness: Awake/alert Orientation Level: Person;Place;Situation Person: Oriented Place: Oriented Situation: Oriented Year: 2018 Month: May Day of Week: Correct Memory: Impaired Memory Impairment: Decreased recall of new information Immediate Memory Recall: Sock;Blue;Bed Memory Recall: Sock;Blue Memory Recall Sock: Without Cue Memory Recall Blue: Without Cue Attention: Sustained Sustained Attention: Appears intact Awareness: Impaired Awareness Impairment: Emergent impairment Problem Solving: Impaired Problem Solving Impairment: Functional complex Executive Function: Self Correcting Self Correcting: Impaired Self Correcting Impairment: Functional complex Safety/Judgment: Impaired Sensation Sensation Light Touch: Appears Intact Stereognosis: Appears Intact Hot/Cold: Appears  Intact Proprioception: Appears Intact Additional Comments: Pt reports hx of tingling in B LEs since hip surgery 10 years ago Coordination Gross Motor Movements are Fluid and Coordinated: No Fine Motor Movements are Fluid and Coordinated: Yes Coordination and Movement Description: Generalized weakness, LE>UE Motor  Motor Motor: Other (comment) Motor - Skilled Clinical Observations: Generalized weakness  Trunk/Postural Assessment  Cervical Assessment Cervical Assessment: Exceptions to [facility] (Forward head) Thoracic Assessment Thoracic Assessment: Exceptions to [facility] (Rounded shoulders; kyphotic) Lumbar Assessment Lumbar Assessment: Exceptions to [facility] (Posterior pelvic tilt) Postural Control Postural Control: Deficits on evaluation (Physical assist for upright standing posture)  Balance Balance Balance Assessed: Yes Static Sitting Balance Static Sitting - Balance Support: Feet supported Static Sitting - Level of Assistance: 5: Stand by assistance Dynamic Sitting Balance Dynamic Sitting - Balance Support: During functional activity;Feet supported;Right upper extremity supported;Left upper extremity supported Dynamic Sitting - Level of Assistance: 5: Stand by assistance;4: Min assist Sitting balance - Comments: Supported Sitting to complete bathing/dressing tasks Theatre stage manager Standing - Balance Support: During functional activity;Bilateral upper extremity supported Static Standing - Level of Assistance: 3: Mod assist;2: Max assist Static Standing - Comment/# of Minutes: Standing to complete LB dressing Dynamic Standing Balance Dynamic Standing - Balance Support: During functional activity;Bilateral upper extremity supported Dynamic Standing - Level of Assistance: 3: Mod assist;2: Max assist Dynamic Standing - Comments: Standing at sink Extremity/Trunk Assessment RUE Assessment RUE Assessment: Exceptions to [facility] (4-/5 throughout; ~160 degrees shoulder flexion) LUE  Assessment LUE Assessment: Exceptions to [facility] (4-/5 throughout; ROM [facility])   See Function Navigator for Current Functional Status.   Refer to Care Plan for Long Term Goals  Recommendations for other services: None    Discharge Criteria: Patient will be discharged from OT if patient refuses treatment 3 consecutive times without medical reason, if treatment goals not met, if there is a change in medical status, if patient makes no progress towards goals or if patient is discharged from hospital.  The above assessment, treatment plan, treatment alternatives and goals were discussed and mutually agreed upon: by patient  Ernestina Patches 03/09/2017, 11:25 AM

## 2017-03-09 NOTE — Progress Notes (Signed)
Patient A/O, no noted distress. Denies pain and able to make needs known . Pt ambulated to bathroom with stand by assist. There was no noted confusion. Pt likes for her room to be extremely hot; her daughter confirms. Pt hard of hearing. Educated her and daughter on medication. Staff will continue to monitor and meet needs. She likes a newspaper in the morning. Uses her call light appropriately.

## 2017-03-09 NOTE — Progress Notes (Signed)
Social Work  Social Work Assessment and Plan  Patient Details  Name: [name] MRN: [id number] Date of Birth: 18-Aug-1941  Today's Date: 03/09/2017  Problem List:  Patient Active Problem List   Diagnosis Date Noted  . Subdural hemorrhage following injury without open intracranial wound and with loss of consciousness of 30 minutes or less, sequela ([place]) 03/08/2017  . Fall   . TBI (traumatic brain injury) ([place])   . History of bilateral hip replacements   . Steroid-induced hyperglycemia   . Acute blood loss anemia   . Elevated blood pressure reading   . Stage 3 chronic kidney disease   . Subdural hematoma ([place]) 03/06/2017  . SDH (subdural hematoma) ([place]) 02/10/2017  . Subdural hematoma without coma ([place]) 02/09/2017  . Acute low back pain 02/04/2017  . Memory disorder 12/20/2016  . History of lumbar laminectomy for spinal cord decompression 02/02/2016  . Atopic dermatitis 07/09/2015  . Bereavement 12/01/2014  . Psychosis 12/01/2014  . Reactive airway disease 08/11/2014  . History of cancer 08/11/2014  . OSA (obstructive sleep apnea) 08/11/2014  . Healthcare maintenance 07/17/2013  . Chronic kidney disease (CKD), stage III (moderate) 10/04/2012  . Schizophrenia, chronic condition ([place]) 01/20/2012  . Postmenopausal atrophic vaginitis 11/01/2010  . Chronic diastolic heart failure ([place]) 05/06/2010  . Allergic rhinitis 10/14/2009  . Obesity, Class II, BMI 35-39.9, with comorbidity 05/14/2009  . Chronic prescription opiate use 04/29/2008  . Normocytic anemia 09/25/2007  . Hyperlipidemia 09/03/2006  . Gout 09/03/2006  . Essential hypertension 09/03/2006  . GERD 09/03/2006  . Hx-TIA (transient ischemic attack) 05/13/2001   Past Medical History:  Past Medical History:  Diagnosis Date  . Chronic venous insufficiency   . Colon, diverticulosis Sept. 2011   outpatient colonoscopy by Dr. Cristina Gong.  Need record  . DDD (degenerative disc disease), lumbosacral   . Depression   .  DJD (degenerative joint disease), multiple sites    Low back pain worst  . GERD (gastroesophageal reflux disease)   . Gout   . Gout    Uric Acid level 4.2 on 300 allopurinol  . History of cervical cancer 1972  . Hx-TIA (transient ischemic attack) 05/13/2001   Right facial numbness  . Hyperlipidemia   . Hypertension   . Memory disorder 12/20/2016  . Mitral valve prolapse 10/27/1999   Subsequent 2D echo show normal mitral valve  . Osteoarthritis of hip    bilateral hips  . Ovarian cancer ([place]) 1972   S/P oophorectomy  . Psychosis   . Recurrent boils    History of MRSA skin infections with abscess  . Sensorineural hearing loss of both ears   . Subdural hematoma ([place]) 01/26/2017   bilateral   Past Surgical History:  Past Surgical History:  Procedure Laterality Date  . ABDOMINAL HYSTERECTOMY     1972  . BURR HOLE Bilateral 02/12/2017   Procedure: Haskell Flirt;  Surgeon: Earnie Larsson, MD;  Location: [facility];  Service: Neurosurgery;  Laterality: Bilateral;  . CERVICAL DISCECTOMY  7/07   C5-C6  . JOINT REPLACEMENT     bilateral hip replacement  . LUMBAR DISC SURGERY     L5-S1 7/07  . LUMBAR LAMINECTOMY/DECOMPRESSION MICRODISCECTOMY N/A 02/02/2016   Procedure: L4-5 Decompression;  Surgeon: Marybelle Killings, MD;  Location: [facility];  Service: Orthopedics;  Laterality: N/A;  . [place] for ovarian cancer   Social History:  reports that she quit smoking about 3 years ago. Her smoking use included Cigarettes. She smoked  0.10 packs per day. She has never used smokeless tobacco. She reports that she does not drink alcohol or use drugs.  Family / Support Systems Marital Status: Widow/Widower How Long?: ~20 yrs Patient Roles: Parent, Other (Comment) (grandparent) Children: daughter, Loetta Rough @ [phone]; 2 sons currently incarcerated and one son deceased Other Supports: grandaughter, Lebron Conners Anticipated Caregiver: Daughter and granddaughter  Ability/Limitations of Caregiver:  when daughter works granddaughter will be home Caregiver Availability: 24/7 Family Dynamics: Daughter very involved and attentive.  She is feeling guilty about pt's last fall as she feels she could have prevented if she hadn't told pt ok to boil some eggs.  She describes having a close relationship with her mother.  Social History Preferred language: English Religion: Catholic Cultural Background: NA Read: Yes Write: Yes Employment Status: Retired Freight forwarder Issues: None Guardian/Conservator: None - per MD, pt is capable of making decisions on her own behal.   Abuse/Neglect Physical Abuse: Denies Verbal Abuse: Denies Sexual Abuse: Denies Exploitation of patient/patient's resources: Denies Self-Neglect: Denies  Emotional Status Pt's affect, behavior adn adjustment status: Pt very pleasant, talktative and somewhat scattered in topics.  Required minimal redirection at times and had to ask her to clarify her information.  She expresses much frustration with her multiple falls at home and blames herself for making poor choices leading to falls.  She is hopeful she can come close to reaching her prior independent fxn.  She denies any significant emotional distress r/t current situation but talks openly about her chronic struggle with depression and "nerves" Recent Psychosocial Issues: Pt speaks of her youngest son who committed suicide 4 yrs ago and how difficult his loss was for herself and her daughter.  Pyschiatric History: Begain seeing a psychiatrist, Dr. Adele Schilder, after the loss of her son.  "I see him for my depression and my nerves." Substance Abuse History: None  Patient / Family Perceptions, Expectations & Goals Pt/Family understanding of illness & functional limitations: Pt and daughter with good understanding of her acute/ chronic SDHs after several falls.  Good, basic understanding of her cognitive deficits, functional limitations/ need for CIR. Premorbid pt/family  roles/activities: Daughter had been providing close to 24/7 supervision over past few week since 1st  fall.  Prior to 1st fall, however, pt was living independently and getting around in the community via [facility] Anticipated changes in roles/activities/participation: Daughter states, "I'm going to be with her all the time now.  It will be me, my daughter and her aide." Pt/family expectations/goals: "I just want to be able to get my legs stronger and do as much as I can for myself."  [employer]: Other (Comment) Manufacturing systems engineer) Premorbid Home Care/DME Agencies: Other (Comment) ([facility] for txs:  PCS Aide via [facility]) Transportation available at discharge: yes Resource referrals recommended: Neuropsychology, Support group (specify)  Discharge Planning Living Arrangements: Children Support Systems: Children, Home care staff Type of Residence: Private residence Insurance Resources: Medicaid (specify county) (Equities trader (primary)) Financial Resources: Social Security Financial Screen Referred: No Living Expenses: Own Money Management: Patient Does the patient have any problems obtaining your medications?: No Home Management: daughter and PCS aide assist pt Patient/Family Preliminary Plans: Pt to return to her own home with daughter, grandaughter and PCS Aide providing support Social Work Anticipated Follow Up Needs: HH/OP Expected length of stay: 9-11 days  Clinical Impression Very pleasant, elderly woman here following multiple falls and with acute on chronic SDHs.  Able to complete assessment interview without much difficulty.  Very talkative and needed minimal clarification and redirection to topic at times.  All information confirmed correct by her daughter who is very supportive of mother.  Family prepared to provide 24/7 supervision - assist at home.  Pt speaks openly about chronic depression and "nerves" (followed by psychiatrist in community) so will  monitor mood while here.  Will follow for support and d/c planning needs.  Bristyl Mclees 03/09/2017, 2:29 PM

## 2017-03-09 NOTE — Progress Notes (Signed)
[place] PHYSICAL MEDICINE & REHABILITATION     PROGRESS NOTE    Subjective/Complaints: Was cold last night. Didn't get enough blankets. Had trouble sleeping as a whole--thinks steroids make her anxious. Low back and hips hurt which made it tough to sleep also  ROS: pt denies nausea, vomiting, diarrhea, cough, shortness of breath or chest pain   Objective: Vital Signs: Blood pressure (!) 90/43, pulse 62, temperature 98.3 F (36.8 C), temperature source Oral, resp. rate 17, height 5\' 3"  (1.6 m), SpO2 94 %. No results found.  Recent Labs  03/06/17 1331 03/09/17 0557  WBC 7.3 12.9*  HGB 11.2* 10.2*  HCT 34.8* 31.7*  PLT 224 236    Recent Labs  03/06/17 1331 03/09/17 0557  NA 141 139  K 3.6 4.1  CL 110 108  GLUCOSE 103* 133*  BUN 11 26*  CREATININE 1.18* 1.39*  CALCIUM 9.5 9.0   CBG (last 3)   Recent Labs  03/07/17 0756 03/08/17 0725  GLUCAP 143* 171*    Wt Readings from Last 3 Encounters:  03/06/17 90.2 kg (198 lb 13.7 oz)  02/09/17 86.5 kg (190 lb 12.8 oz)  01/26/17 89.6 kg (197 lb 8 oz)    Physical Exam:  Constitutional: She appears well-developed.  HENT:  Head: Normocephalic.  Eyes: EOMI Neck: Normal range of motion. Neck supple. No tracheal deviationpresent. No thyromegalypresent.  Cardiovascular: RRR without murmurs  Respiratory: no wheezes or rales, clear.  YW:VPXT NT/ND, BS+  Skin. Warm and dry Neurological: She is alert and oriented x 3.  Reasonable insight and awareness. A little impulsive.  Motor: B/l UE 5/5 proximal to distal B/l LE: 4/5 HF, 4/5 Add, 4/5 Hip Abd, KE, 5/5 ADF/PF---motor exam stable Psych: pleasant and cooperative  Assessment/Plan: 1. Functional and cognitive deficits secondary to SDH which require 3+ hours per day of interdisciplinary therapy in a comprehensive inpatient rehab setting. Physiatrist is providing close team supervision and 24 hour management of active medical problems listed below. Physiatrist and  rehab team continue to assess barriers to discharge/monitor patient progress toward functional and medical goals.  Function:  Bathing Bathing position      Bathing parts      Bathing assist        Upper Body Dressing/Undressing Upper body dressing                    Upper body assist        Lower Body Dressing/Undressing Lower body dressing                                  Lower body assist        Toileting Toileting          Toileting assist     Transfers Chair/bed transfer             Locomotion Ambulation           Wheelchair          Cognition Comprehension Comprehension assist level: Follows basic conversation/direction with extra time/assistive device  Expression Expression assist level: Expresses basic needs/ideas: With no assist  Social Interaction Social Interaction assist level: Interacts appropriately 75 - 89% of the time - Needs redirection for appropriate language or to initiate interaction. (verbose and tangential )  Problem Solving Problem solving assist level: Solves basic 50 - 74% of the time/requires cueing 25 - 49% of the time  Memory Memory  assist level: Recognizes or recalls 50 - 74% of the time/requires cueing 25 - 49% of the time   Medical Problem List and Plan: 1. Decreased functional mobilitysecondary to acute on chronic left traumatic SDH status post bilateral frontal burr hole 02/12/2017  -begin CIR therapies 2. DVT Prophylaxis/Anticoagulation: SCDs. Patient is ambulatory 3. Pain Managementwith history of bilateral total hip replacement and chronic back pain: Ultram as needed  -add kpad for back/hips 4. Mood: Wellbutrin 300 mg daily, hydroxyzine 25 mg daily at bedtime 5. Neuropsych: This patient iscapable of making decisions on herown behalf. 6. Skin/Wound Care: Routine skin checks 7. Fluids/Electrolytes/Nutrition: I personally reviewed the patient's labs today.   -BUN increased---push PO-, hold  lasix  -recheck on Sunday 8.Hypertension. Norvasc 10 mg daily, Lasix 20 mg daily, Cozaar 100 mg daily. Monitor with increased mobility 9.Gout. Allopurinol. Monitor for any gout flareups 10. Overactive bladder. Did trip and 5 mg daily. Check PVR 3 11.Hyperlipidemia. Lipitor 12.GERD. Protonix 13.Constipation. Laxative assistance 14. ABLA: hgb 10.2 today, no signs of blood loss 15. Leukocytosis: 12.9 today. I suspect this is steroid related   LOS (Days) [place] T, MD 03/09/2017 9:54 AM

## 2017-03-09 NOTE — Evaluation (Signed)
Physical Therapy Assessment and Plan  Patient Details  Name: [name] MRN: [id number] Date of Birth: 1941/04/11  PT Diagnosis: Abnormality of gait, Difficulty walking, Hemiplegia dominant, Impaired cognition and Muscle weakness (generalized & BLE) Rehab Potential: Good ELOS: 7-10 days   Today's Date: 03/09/2017 PT Individual Time: 1305-1430 PT Individual Time Calculation (min): 85 min    Problem List:  Patient Active Problem List   Diagnosis Date Noted  . Subdural hemorrhage following injury without open intracranial wound and with loss of consciousness of 30 minutes or less, sequela ([place]) 03/08/2017  . Fall   . TBI (traumatic brain injury) ([place])   . History of bilateral hip replacements   . Steroid-induced hyperglycemia   . Acute blood loss anemia   . Elevated blood pressure reading   . Stage 3 chronic kidney disease   . Subdural hematoma ([place]) 03/06/2017  . SDH (subdural hematoma) ([place]) 02/10/2017  . Subdural hematoma without coma ([place]) 02/09/2017  . Acute low back pain 02/04/2017  . Memory disorder 12/20/2016  . History of lumbar laminectomy for spinal cord decompression 02/02/2016  . Atopic dermatitis 07/09/2015  . Bereavement 12/01/2014  . Psychosis 12/01/2014  . Reactive airway disease 08/11/2014  . History of cancer 08/11/2014  . OSA (obstructive sleep apnea) 08/11/2014  . Healthcare maintenance 07/17/2013  . Chronic kidney disease (CKD), stage III (moderate) 10/04/2012  . Schizophrenia, chronic condition ([place]) 01/20/2012  . Postmenopausal atrophic vaginitis 11/01/2010  . Chronic diastolic heart failure ([place]) 05/06/2010  . Allergic rhinitis 10/14/2009  . Obesity, Class II, BMI 35-39.9, with comorbidity 05/14/2009  . Chronic prescription opiate use 04/29/2008  . Normocytic anemia 09/25/2007  . Hyperlipidemia 09/03/2006  . Gout 09/03/2006  . Essential hypertension 09/03/2006  . GERD 09/03/2006  . Hx-TIA (transient ischemic attack) 05/13/2001    Past  Medical History:  Past Medical History:  Diagnosis Date  . Chronic venous insufficiency   . Colon, diverticulosis Sept. 2011   outpatient colonoscopy by Dr. Cristina Gong.  Need record  . DDD (degenerative disc disease), lumbosacral   . Depression   . DJD (degenerative joint disease), multiple sites    Low back pain worst  . GERD (gastroesophageal reflux disease)   . Gout   . Gout    Uric Acid level 4.2 on 300 allopurinol  . History of cervical cancer 1972  . Hx-TIA (transient ischemic attack) 05/13/2001   Right facial numbness  . Hyperlipidemia   . Hypertension   . Memory disorder 12/20/2016  . Mitral valve prolapse 10/27/1999   Subsequent 2D echo show normal mitral valve  . Osteoarthritis of hip    bilateral hips  . Ovarian cancer ([place]) 1972   S/P oophorectomy  . Psychosis   . Recurrent boils    History of MRSA skin infections with abscess  . Sensorineural hearing loss of both ears   . Subdural hematoma ([place]) 01/26/2017   bilateral   Past Surgical History:  Past Surgical History:  Procedure Laterality Date  . ABDOMINAL HYSTERECTOMY     1972  . BURR HOLE Bilateral 02/12/2017   Procedure: Haskell Flirt;  Surgeon: Earnie Larsson, MD;  Location: [facility];  Service: Neurosurgery;  Laterality: Bilateral;  . CERVICAL DISCECTOMY  7/07   C5-C6  . JOINT REPLACEMENT     bilateral hip replacement  . LUMBAR DISC SURGERY     L5-S1 7/07  . LUMBAR LAMINECTOMY/DECOMPRESSION MICRODISCECTOMY N/A 02/02/2016   Procedure: L4-5 Decompression;  Surgeon: Marybelle Killings, MD;  Location: [facility];  Service:  Orthopedics;  Laterality: N/A;  . [place] for ovarian cancer    Assessment & Plan Clinical Impression: Patient is a 76 y.o. year old female, right handed, with history ofbilateral hip replacement,bilateral frontal burr hole for evacuation of subdural hematoma in April 16 by Dr.Poolafter recent fall. She was discharged to home 02/15/2017 with her daughter at minimal assist level using a  walkerand home health therapies .Her daughter works during the day and her niece plans to assist as needed .Presented 03/06/2017 with multiple falls and reports of increasing lower extremity weakness. There has been no loss of consciousness. Cranial CT reviewed, showing left SDH. Per report, increased size of left extra-axial collection compatible with acute on chronic subdural hematoma. Increased left sided mass effect with now a 4 mm of left-to-right midline shift. Neurosurgery follow-up currently on Decadron protocol and conservative care. Occupational therapy evaluation completed with recommendations of physical medicine rehabilitation consult. Patient was admitted for comprehensive rehabilitation program.  Patient transferred to CIR on 03/08/2017 .   Patient currently requires min with mobility secondary to muscle weakness, decreased cardiorespiratoy endurance, decreased visual perceptual skills, decreased awareness, decreased problem solving, decreased safety awareness and decreased memory, and decreased standing balance, decreased postural control, hemiplegia and decreased balance strategies.  Prior to hospitalization, patient was modified independent  with mobility with RW and lived with Family (daughter) in a House home.  Home access is 4Ramped entrance.  Patient will benefit from skilled PT intervention to maximize safe functional mobility, minimize fall risk and decrease caregiver burden for planned discharge home with 24 hour assist.  Anticipate patient will benefit from follow up [facility] at discharge.  PT - End of Session Activity Tolerance: Tolerates 30+ min activity with multiple rests Endurance Deficit: Yes Endurance Deficit Description: 2/2 fatigue PT Assessment Rehab Potential (ACUTE/IP ONLY): Good PT Patient demonstrates impairments in the following area(s): Balance;Behavior;Endurance;Motor;Safety;Perception;Sensory;Skin Integrity PT Transfers Functional Problem(s): Bed Mobility;Bed to  Chair;Car;Furniture PT Locomotion Functional Problem(s): Ambulation;Wheelchair Mobility;Stairs PT Plan PT Intensity: Minimum of 1-2 x/day ,45 to 90 minutes PT Frequency: 5 out of 7 days PT Duration Estimated Length of Stay: 7-10 days PT Treatment/Interventions: Ambulation/gait training;DME/adaptive equipment instruction;Neuromuscular re-education;Psychosocial support;Stair training;UE/LE Strength taining/ROM;Wheelchair propulsion/positioning;UE/LE Coordination activities;Therapeutic Activities;Pain management;Discharge planning;Balance/vestibular training;Cognitive remediation/compensation;Disease management/prevention;Functional mobility training;Patient/family education;Therapeutic Exercise;Visual/perceptual remediation/compensation PT Transfers Anticipated Outcome(s): supervision with LRAD PT Locomotion Anticipated Outcome(s): supervision with LRAD for household distances PT Recommendation Recommendations for Other Services: Neuropsych consult;Therapeutic Recreation consult Therapeutic Recreation Interventions: Pet therapy Follow Up Recommendations: Home health PT;24 hour supervision/assistance Patient destination: Home Equipment Recommended: To be determined  Skilled Therapeutic Intervention Pt received in room with daughter [name]) & aide Kennyth Lose) present for first few minutes of session. Therapist provided education on ELOS, weekly interdisciplinary team meeting, safety plan while in room, and daily therapy schedule. Pt then completed functional mobility tasks (please see above & below for details) with min assist and RW overall. During gait pt demonstrates shuffled gait pattern, but step length slowly increased as session progressed. Therapist provided assistance for steering RW as pt demonstrated impaired obstacle avoidance on L &R.  Pt also requires mod assist to transfer BLE in/out of car; pt reports "I can't bend my knees" but PROM WFL. Pt negotiated stairs with B rails but requires mod  assist and max cuing for foot placement as pt would put foot half way on each step, decreasing her balance & increasing her fall risk. Pt reported need to use restroom & had continent void on toilet. Pt required min  assist for standing balance when performing perihygiene, clothing management, and hand hygiene at sink. At end of session pt left in recliner with BLE elevated & all needs within reach.   PT Evaluation Precautions/Restrictions Precautions Precautions: Fall Restrictions Weight Bearing Restrictions: No  General Chart Reviewed: Yes Additional Pertinent History: B hip replacement, B frontal burr hole for evacuation (April 2018), DDD, DJD, low back pain, gout, cervical CA, TIA (2002), ovarian CA (1972), hyperlipidemia, HTN, memory disorder, mitral valve prolapse, psychosis, hx of MRSA, sensorinueral hearing loss bilaterally Response to Previous Treatment: Patient with no complaints from previous session. Family/Caregiver Present:  (daughter [name] & aide Kennyth Lose present for first few minutes of session)  Pain Pain Assessment Pain Assessment: No/denies pain  Home Living/Prior Functioning Home Living Available Help at Discharge: Family;Personal care attendant (daughter, granddaughter, & aide) Type of Home: House Home Access: Ramped entrance Entrance Stairs-Number of Steps: 4 Entrance Stairs-Rails: Right Home Layout: One level Bathroom Shower/Tub: Multimedia programmer: Standard Bathroom Accessibility: Yes Additional Comments: has an aide 5 days a week for 2 hours   Lives With: Family (daughter) Prior Function Level of Independence: Requires assistive device for independence;Independent with transfers;Independent with gait  Able to Take Stairs?: No Driving: No  Vision/Perception  Perception Perception:  (pt requires cuing for obstacle avoidance on both L & R during ambulation)   Cognition Arousal/Alertness: Awake/alert Orientation Level: Oriented X4 Memory:  Impaired (pt repeated story to therapist 2x within 45 minutes) Awareness: Impaired Safety/Judgment: Impaired  Sensation Sensation Light Touch: Appears Intact (pt reports BLE are equal & intact to light touch but reports numbness & tingling in BLE for the past 3-4 weeks) Stereognosis: Appears Intact Hot/Cold: Appears Intact Proprioception: Appears Intact (BLE)  Motor  Motor Motor:  (general weakness overall and in BLE) Motor - Skilled Clinical Observations: Generalized weakness   Mobility Bed Mobility Bed Mobility: Rolling Left;Supine to Sit;Sit to Supine Rolling Left: 4: Min assist Rolling Left Details: Tactile cues for sequencing;Tactile cues for weight shifting Supine to Sit: 3: Mod assist Supine to Sit Details: Manual facilitation for weight shifting;Manual facilitation for placement Sit to Supine: 4: Min assist Sit to Supine - Details: Manual facilitation for weight shifting;Manual facilitation for placement Transfers Transfers: Yes Sit to Stand: 4: Min guard;With armrests Stand Pivot Transfers: 4: Min assist (HHA)   Locomotion  Ambulation Ambulation: Yes Ambulation/Gait Assistance: 4: Min assist Ambulation Distance (Feet): 22 Feet Assistive device: Rolling walker Gait Gait: Yes Gait Pattern: Decreased step length - left;Decreased step length - right;Shuffle (decreased hip & knee flexion BLE during swing phase, absent heel strike BLE)   Balance Balance Balance Assessed: Yes Dynamic Standing Balance Dynamic Standing - Balance Support:  (one upper extremity supported) Dynamic Standing - Level of Assistance: 4: Min assist Dynamic Standing - Balance Activities:  (hand hygiene standing at sink)   Extremity Assessment  RLE Assessment RLE Assessment: Exceptions to [facility] (3/5 knee extensors) LLE Assessment LLE Assessment: Within Functional Limits   See Function Navigator for Current Functional Status.   Refer to Care Plan for Long Term Goals  Recommendations for  other services: Neuropsych and Therapeutic Recreation  Pet therapy  Discharge Criteria: Patient will be discharged from PT if patient refuses treatment 3 consecutive times without medical reason, if treatment goals not met, if there is a change in medical status, if patient makes no progress towards goals or if patient is discharged from hospital.  The above assessment, treatment plan, treatment alternatives and goals were discussed and mutually agreed upon:  by patient  Waunita Schooner 03/09/2017, 3:30 PM

## 2017-03-09 NOTE — Progress Notes (Signed)
Occupational Therapy Session Note  Patient Details  Name: [name] MRN: [id number] Date of Birth: 1940/11/11  Today's Date: 03/09/2017 OT Individual Time: 1500-1559 OT Individual Time Calculation (min): 59 min    Short Term Goals: Week 1:  OT Short Term Goal 1 (Week 1): Pt will complete stand pivot transfer to toilet with min A OT Short Term Goal 2 (Week 1): Pt will stand to complete grooming task with steadying assist to increase functional activity tolerance OT Short Term Goal 3 (Week 1): Pt will complete clothing management during toileting task with no more than mod steadying assist OT Short Term Goal 4 (Week 1): Pt will dress UB with set-up assist  Skilled Therapeutic Interventions/Progress Updates:    1:1. No pain reported. Pt ambulates/ propels w/c with MIN A and VC for visual scanning to avoid obstacles on R/L. Pt easily distracted by internal and external stimuli requiring MOD-MAX cues for sequencing/safety awareness during functional mobility/tranfers. Pt completes ambulatory toilet transfer with VC for foot clearance, longer strides, safety awareness and RW management with MINA for balance. Pt grooms in sitting at sink in standard chair to complete oral care with VC to terminate tasks and scan for needed items. In therapy gym, pt stands ~10 min with 2 seated rest breaks to cross midline, obtain playing card and match with pair positioned on mirror with MIN A for balance and VC/tactile cues for posture. Exited session with pt seated in recliner, call light in reach and all needs met.   Therapy Documentation Precautions:  Precautions Precautions: Fall Restrictions Weight Bearing Restrictions: No General:   Vital Signs: Therapy Vitals Temp: 98.4 F (36.9 C) Temp Source: Oral Pulse Rate: 64 Resp: 18 BP: 99/76 Patient Position (if appropriate): Sitting Oxygen Therapy SpO2: 100 % O2 Device: Not Delivered Pain: Pain Assessment Pain Assessment: No/denies  pain ADL:   Vision Baseline Vision/History: Cataracts (hx of L cataract surgery, pt reports she still has R cataracts that need to be removed; wears glasses at baseline 2/2 cataracts; pt reports occasional shadows on R side that were present PTA) Wears Glasses: At all times Patient Visual Report: No change from baseline Vision Assessment?: Vision impaired- to be further tested in functional context   Therapy/Group: Individual Therapy  Tonny Branch 03/09/2017, 4:06 PM

## 2017-03-09 NOTE — Plan of Care (Signed)
Problem: RH Car Transfers Goal: LTG Patient will perform car transfers with assist (PT) LTG: Patient will perform car transfers with assistance (PT). With LRAD  Problem: RH Furniture Transfers Goal: LTG Patient will perform furniture transfers w/assist (OT/PT LTG: Patient will perform furniture transfers  with assistance (OT/PT). With LRAD  Problem: RH Ambulation Goal: LTG Patient will ambulate in controlled environment (PT) LTG: Patient will ambulate in a controlled environment, # of feet with assistance (PT). 100 ft with LRAD Goal: LTG Patient will ambulate in home environment (PT) LTG: Patient will ambulate in home environment, # of feet with assistance (PT). 83 ft with LRAD  Problem: RH Stairs Goal: LTG Patient will ambulate up and down stairs w/assist (PT) LTG: Patient will ambulate up and down # of stairs with assistance (PT) 4 steps with B rails for strengthening with PT only  Problem: RH Balance Goal: LTG Patient will maintain dynamic standing balance (PT) LTG:  Patient will maintain dynamic standing balance with assistance during mobility activities (PT) With LRAD

## 2017-03-09 NOTE — Evaluation (Signed)
Speech Language Pathology Assessment and Plan  Patient Details  Name: [name] MRN: [id number] Date of Birth: 1940/11/14  SLP Diagnosis: Cognitive Impairments  Rehab Potential: Good ELOS: 9-11 days     Today's Date: 03/09/2017 SLP Individual Time: 9983-3825 SLP Individual Time Calculation (min): 57 min   Problem List:  Patient Active Problem List   Diagnosis Date Noted  . Subdural hemorrhage following injury without open intracranial wound and with loss of consciousness of 30 minutes or less, sequela ([place]) 03/08/2017  . Fall   . TBI (traumatic brain injury) ([place])   . History of bilateral hip replacements   . Steroid-induced hyperglycemia   . Acute blood loss anemia   . Elevated blood pressure reading   . Stage 3 chronic kidney disease   . Subdural hematoma ([place]) 03/06/2017  . SDH (subdural hematoma) ([place]) 02/10/2017  . Subdural hematoma without coma ([place]) 02/09/2017  . Acute low back pain 02/04/2017  . Memory disorder 12/20/2016  . History of lumbar laminectomy for spinal cord decompression 02/02/2016  . Atopic dermatitis 07/09/2015  . Bereavement 12/01/2014  . Psychosis 12/01/2014  . Reactive airway disease 08/11/2014  . History of cancer 08/11/2014  . OSA (obstructive sleep apnea) 08/11/2014  . Healthcare maintenance 07/17/2013  . Chronic kidney disease (CKD), stage III (moderate) 10/04/2012  . Schizophrenia, chronic condition ([place]) 01/20/2012  . Postmenopausal atrophic vaginitis 11/01/2010  . Chronic diastolic heart failure ([place]) 05/06/2010  . Allergic rhinitis 10/14/2009  . Obesity, Class II, BMI 35-39.9, with comorbidity 05/14/2009  . Chronic prescription opiate use 04/29/2008  . Normocytic anemia 09/25/2007  . Hyperlipidemia 09/03/2006  . Gout 09/03/2006  . Essential hypertension 09/03/2006  . GERD 09/03/2006  . Hx-TIA (transient ischemic attack) 05/13/2001   Past Medical History:  Past Medical History:  Diagnosis Date  . Chronic venous  insufficiency   . Colon, diverticulosis Sept. 2011   outpatient colonoscopy by Dr. Cristina Gong.  Need record  . DDD (degenerative disc disease), lumbosacral   . Depression   . DJD (degenerative joint disease), multiple sites    Low back pain worst  . GERD (gastroesophageal reflux disease)   . Gout   . Gout    Uric Acid level 4.2 on 300 allopurinol  . History of cervical cancer 1972  . Hx-TIA (transient ischemic attack) 05/13/2001   Right facial numbness  . Hyperlipidemia   . Hypertension   . Memory disorder 12/20/2016  . Mitral valve prolapse 10/27/1999   Subsequent 2D echo show normal mitral valve  . Osteoarthritis of hip    bilateral hips  . Ovarian cancer ([place]) 1972   S/P oophorectomy  . Psychosis   . Recurrent boils    History of MRSA skin infections with abscess  . Sensorineural hearing loss of both ears   . Subdural hematoma ([place]) 01/26/2017   bilateral   Past Surgical History:  Past Surgical History:  Procedure Laterality Date  . ABDOMINAL HYSTERECTOMY     1972  . BURR HOLE Bilateral 02/12/2017   Procedure: Haskell Flirt;  Surgeon: Earnie Larsson, MD;  Location: [facility];  Service: Neurosurgery;  Laterality: Bilateral;  . CERVICAL DISCECTOMY  7/07   C5-C6  . JOINT REPLACEMENT     bilateral hip replacement  . LUMBAR DISC SURGERY     L5-S1 7/07  . LUMBAR LAMINECTOMY/DECOMPRESSION MICRODISCECTOMY N/A 02/02/2016   Procedure: L4-5 Decompression;  Surgeon: Marybelle Killings, MD;  Location: [facility];  Service: Orthopedics;  Laterality: N/A;  . OOPHORECTOMY  1972 for ovarian cancer    Assessment / Plan / Recommendation Clinical Impression   Shelby Patman McKinneyis a 76 y.o.right handed femalewith history ofbilateral hip replacement,bilateral frontal burr hole for evacuation of subdural hematoma in April 16 by Dr.Poolafter recent fall. She was discharged to home 02/15/2017 with her daughter at minimal assist level using a walkerand home health therapies .Her daughter works during the day  and her niece plans to assist as needed .Presented 03/06/2017 with multiple falls and reports of increasing lower extremity weakness. There has been no loss of consciousness. Cranial CT reviewed, showing left SDH. Per report, increased size of left extra-axial collection compatible with acute on chronic subdural hematoma. Increased left sided mass effect with now a 4 mm of left-to-right midline shift. Neurosurgery follow-up currently on [facility] protocol and conservative care. Occupational therapy evaluation completed with recommendations of physical medicine rehabilitation consult. Patient was admitted for comprehensive rehabilitation program on 03/08/2017.  SLP evaluation was completed on 03/09/2017 with the following results:  Pt presents with mild-moderate cognitive impairments characterized by decreased recall of daily information, decreased functional problem solving, and decreased emergent awareness.  As a result, pt would benefit from skilled ST follow up while inpatient in order to maximize functional independence and reduce burden of care prior to discharge.  Anticipate that pt will need 24/7 supervision at discharge in addition to [place] follow up at next level of care.    Skilled Therapeutic Interventions          Cognitive-linguistic evaluation completed with results and recommendations reviewed with patient and family.   Pt needed mod assist verbal cues for working memory of task instructions during portions of the CLQT standardized cognitive assessment.  She was noted to monitor her work for errors but needed mod-max assist verbal cues for correction.  Discussed plan of care and rationale for ST interventions with pt and pt's daughter.  All questions were answered to their satisfaction at this time.  Pt was left in recliner with call bell within reach.      SLP Assessment  Patient will need skilled [facility] during CIR admission    Recommendations  Recommendations for  Other Services: Neuropsych consult Patient destination: Home Follow up Recommendations: 24 hour supervision/assistance;Home Health SLP;Outpatient SLP Equipment Recommended: None recommended by SLP    SLP Frequency 3 to 5 out of 7 days   SLP Duration  SLP Intensity  SLP Treatment/Interventions 9-11 days   Minumum of 1-2 x/day, 30 to 90 minutes  Cognitive remediation/compensation;Cueing hierarchy;Environmental controls;Internal/external aids;Patient/family education    Pain Pain Assessment Pain Assessment: No/denies pain  Prior Functioning Cognitive/Linguistic Baseline: Within functional limits Type of Home: House  Lives With: Alone (Daughter had  recently moved in with her to provide assist) Available Help at Discharge: Family;Available 24 hours/day (Daughter works during day, granddaughter to provide assist during the day) Education: some college  Vocation: Retired  Function:  Eating Eating              Cognition Comprehension Comprehension assist level: Follows basic conversation/direction with extra time/assistive device  Expression   Expression assist level: Expresses basic needs/ideas: With no assist  Social Interaction Social Interaction assist level: Interacts appropriately 75 - 89% of the time - Needs redirection for appropriate language or to initiate interaction.  Problem Solving Problem solving assist level: Solves basic 50 - 74% of the time/requires cueing 25 - 49% of the time  Memory Memory assist level: Recognizes or recalls 50 - 74% of the  time/requires cueing 25 - 49% of the time   Short Term Goals: Week 1: SLP Short Term Goal 1 (Week 1): Pt will recall new, complex, daily information with min assist verbal cues to use external aids.   SLP Short Term Goal 2 (Week 1): Pt will complete semi-complex tasks wtih min assist verbal cues for functional problem solving.   SLP Short Term Goal 3 (Week 1): Pt will recognize and correct errors during semi-complex  tasks wtih mina ssist verbal cues.    Refer to Care Plan for Long Term Goals  Recommendations for other services: Neuropsych  Discharge Criteria: Patient will be discharged from SLP if patient refuses treatment 3 consecutive times without medical reason, if treatment goals not met, if there is a change in medical status, if patient makes no progress towards goals or if patient is discharged from hospital.  The above assessment, treatment plan, treatment alternatives and goals were discussed and mutually agreed upon: by patient and by family  Emilio Math 03/09/2017, 12:16 PM

## 2017-03-10 ENCOUNTER — Inpatient Hospital Stay (HOSPITAL_COMMUNITY): Payer: PPO | Admitting: Physical Therapy

## 2017-03-10 ENCOUNTER — Inpatient Hospital Stay (HOSPITAL_COMMUNITY): Payer: Self-pay

## 2017-03-10 ENCOUNTER — Inpatient Hospital Stay (HOSPITAL_COMMUNITY): Payer: PPO | Admitting: Speech Pathology

## 2017-03-10 DIAGNOSIS — N183 Chronic kidney disease, stage 3 (moderate): Secondary | ICD-10-CM

## 2017-03-10 LAB — URINALYSIS, ROUTINE W REFLEX MICROSCOPIC
Bilirubin Urine: NEGATIVE
Glucose, UA: NEGATIVE mg/dL
Hgb urine dipstick: NEGATIVE
Ketones, ur: NEGATIVE mg/dL
Nitrite: POSITIVE — AB
Protein, ur: NEGATIVE mg/dL
Specific Gravity, Urine: 1.018 (ref 1.005–1.030)
pH: 5 (ref 5.0–8.0)

## 2017-03-10 MED ORDER — TRAZODONE HCL 50 MG PO TABS
50.0000 mg | ORAL_TABLET | Freq: Every evening | ORAL | Status: DC | PRN
  Filled 2017-03-10: qty 1

## 2017-03-10 MED ORDER — DEXAMETHASONE 2 MG PO TABS
2.0000 mg | ORAL_TABLET | Freq: Four times a day (QID) | ORAL | Status: DC
  Administered 2017-03-10 – 2017-03-13 (×12): 2 mg via ORAL
  Filled 2017-03-10 (×12): qty 1

## 2017-03-10 NOTE — Progress Notes (Signed)
Occupational Therapy Session Note  Patient Details  Name: [name] MRN: [id number] Date of Birth: [birth year]  Today's Date: 03/10/2017 OT Individual Time: 8403-3533 OT Individual Time Calculation (min): 55 min    Short Term Goals: Week 1:  OT Short Term Goal 1 (Week 1): Pt will complete stand pivot transfer to toilet with min A OT Short Term Goal 2 (Week 1): Pt will stand to complete grooming task with steadying assist to increase functional activity tolerance OT Short Term Goal 3 (Week 1): Pt will complete clothing management during toileting task with no more than mod steadying assist OT Short Term Goal 4 (Week 1): Pt will dress UB with set-up assist  Skilled Therapeutic Interventions/Progress Updates:    1:1. No pain reported. Pt ambulate throughout session with MIN A for weight shifting laterally to encourage larger steps and VC for foot clearance. Pt transfers onto toilet with steadying A and VC for safety awareness/RW management to void urine. Pt able to comlete hygiene with steadying balance. Pt transfer onto TTB with MIN A for balance and Vc for sequencing. Pt bathes 10/10 body parts in sitting and standing with VC to wash all body parts and to use grab bars to steady herself while washing buttocks. Pt dresses at sit to stand level to don LB clothing seated on standard chair at sink with Vc to elevate foot on trash can to don socks and thread legs into underwear/pants. Pt stands with MIN A for balance to advance pants over hips, don pull over shirt and complete grooming at sink. Exited session with pt seated in recliner call light in reach and all needs met.  Therapy Documentation Precautions:  Precautions Precautions: Fall Restrictions Weight Bearing Restrictions: No General:   Vital Signs:  Pain: Pain Assessment Pain Assessment: No/denies pain    Therapy/Group: Individual Therapy  Tonny Branch 03/10/2017, 10:18 AM

## 2017-03-10 NOTE — Progress Notes (Signed)
Occupational Therapy Session Note  Patient Details  Name: [name] MRN: [id number] Date of Birth: July 06, 1941  Today's Date: 03/10/2017 OT Individual Time: 1500-1530 OT Individual Time Calculation (min): 30 min    Short Term Goals: Week 1:  OT Short Term Goal 1 (Week 1): Pt will complete stand pivot transfer to toilet with min A OT Short Term Goal 2 (Week 1): Pt will stand to complete grooming task with steadying assist to increase functional activity tolerance OT Short Term Goal 3 (Week 1): Pt will complete clothing management during toileting task with no more than mod steadying assist OT Short Term Goal 4 (Week 1): Pt will dress UB with set-up assist  Skilled Therapeutic Interventions/Progress Updates:    1:1. No pain reported. Pt daughter present during session engaging in discussion about d/c plans and DME options. Pt ambulate to/from tub room with supervision and VC for longer strides, tactile cues for weight shifting, and RW management. Daughter states she would like pt to d/c home with her after CIR for some time before returning to pt's home. Daughter has tub/shower. Pt transfers onto TTB with A to bring B legs into tub, however not out of tub. OT educates pt and daughter on sequence of transfer and safety awareness. Exited session with pt semi reclined in bed and call light in reach.  Therapy Documentation Precautions:  Precautions Precautions: Fall Restrictions Weight Bearing Restrictions: No  See Function Navigator for Current Functional Status.   Therapy/Group: Individual Therapy  Tonny Branch 03/10/2017, 5:20 PM

## 2017-03-10 NOTE — Progress Notes (Signed)
Speech Language Pathology Daily Session Note  Patient Details  Name: [name] MRN: [id number] Date of Birth: [birth year]  Today's Date: 03/10/2017 SLP Individual Time: 1015-1100 SLP Individual Time Calculation (min): 45 min  Short Term Goals: Week 1: SLP Short Term Goal 1 (Week 1): Pt will recall new, complex, daily information with min assist verbal cues to use external aids.   SLP Short Term Goal 2 (Week 1): Pt will complete semi-complex tasks wtih min assist verbal cues for functional problem solving.   SLP Short Term Goal 3 (Week 1): Pt will recognize and correct errors during semi-complex tasks wtih mina ssist verbal cues.    Skilled Therapeutic Interventions: Skilled treatment session focused on cognitive goals. SLP facilitated session by providing Min A verbal cues for problem solving during a basic money management task and to self-monitor and correct errors with task. Patient left upright in recliner with all needs within reach. Continue with current plan of care.      Function:  Cognition Comprehension Comprehension assist level: Follows basic conversation/direction with extra time/assistive device  Expression   Expression assist level: Expresses basic needs/ideas: With no assist  Social Interaction Social Interaction assist level: Interacts appropriately 90% of the time - Needs monitoring or encouragement for participation or interaction.  Problem Solving Problem solving assist level: Solves basic 50 - 74% of the time/requires cueing 25 - 49% of the time  Memory Memory assist level: Recognizes or recalls 50 - 74% of the time/requires cueing 25 - 49% of the time    Pain No/Denies Pain   Therapy/Group: Individual Therapy  Cora Stetson 03/10/2017, 12:53 PM

## 2017-03-10 NOTE — Progress Notes (Signed)
[facility] PHYSICAL MEDICINE & REHABILITATION     PROGRESS NOTE    Subjective/Complaints: Was sweating last night (because of steroids), feels chilled, "was swimming in my urine". Didn't sleep well  ROS: pt denies nausea, vomiting, diarrhea, cough, shortness of breath or chest pain   Objective: Vital Signs: Blood pressure 135/61, pulse (!) 56, temperature 98.4 F (36.9 C), temperature source Oral, resp. rate 19, height 5\' 3"  (1.6 m), SpO2 99 %. No results found.  Recent Labs  03/09/17 0557  WBC 12.9*  HGB 10.2*  HCT 31.7*  PLT 236    Recent Labs  03/09/17 0557  NA 139  K 4.1  CL 108  GLUCOSE 133*  BUN 26*  CREATININE 1.39*  CALCIUM 9.0   CBG (last 3)   Recent Labs  03/08/17 0725  GLUCAP 171*    Wt Readings from Last 3 Encounters:  03/06/17 90.2 kg (198 lb 13.7 oz)  02/09/17 86.5 kg (190 lb 12.8 oz)  01/26/17 89.6 kg (197 lb 8 oz)    Physical Exam:  Constitutional: She appears well-developed.  HENT:  Head: Normocephalic.  Eyes: EOMI Neck: Normal range of motion. Neck supple. No tracheal deviationpresent. No thyromegalypresent.  Cardiovascular: RRR without murmur  Respiratory: cta b PF:XTKW NT/ND, BS+  Skin. Warm and dry Neurological: She is alert and oriented x 3.  Reasonable insight and awareness. Remains a little disinhibited  Motor: B/l UE 5/5 proximal to distal B/l LE: 4/5 HF, 4/5 Add, 4/5 Hip Abd, KE, 5/5 ADF/PF---motor exam stable Psych: pleasant and cooperative  Assessment/Plan: 1. Functional and cognitive deficits secondary to SDH which require 3+ hours per day of interdisciplinary therapy in a comprehensive inpatient rehab setting. Physiatrist is providing close team supervision and 24 hour management of active medical problems listed below. Physiatrist and rehab team continue to assess barriers to discharge/monitor patient progress toward functional and medical goals.  Function:  Bathing Bathing position   Position: Shower   Bathing parts Body parts bathed by patient: Right arm, Left arm, Chest, Abdomen, Front perineal area, Buttocks, Right upper leg, Left lower leg, Right lower leg, Left upper leg Body parts bathed by helper: Back  Bathing assist Assist Level: Touching or steadying assistance(Pt > 75%)      Upper Body Dressing/Undressing Upper body dressing   What is the patient wearing?: Bra, Pull over shirt/dress Bra - Perfomed by patient: Thread/unthread right bra strap Bra - Perfomed by helper: Thread/unthread left bra strap, Hook/unhook bra (pull down sports bra) Pull over shirt/dress - Perfomed by patient: Thread/unthread right sleeve, Thread/unthread left sleeve, Put head through opening Pull over shirt/dress - Perfomed by helper: Pull shirt over trunk        Upper body assist        Lower Body Dressing/Undressing Lower body dressing   What is the patient wearing?: Underwear, Pants, Shoes Underwear - Performed by patient: Thread/unthread right underwear leg Underwear - Performed by helper: Thread/unthread left underwear leg, Pull underwear up/down Pants- Performed by patient: Thread/unthread right pants leg, Thread/unthread left pants leg Pants- Performed by helper: Pull pants up/down           Shoes - Performed by helper: Don/doff right shoe, Don/doff left shoe          Lower body assist Assist for lower body dressing: Touching or steadying assistance (Pt > 75%)      Toileting Toileting     Toileting steps completed by helper: Adjust clothing prior to toileting, Performs perineal hygiene, Adjust clothing after  toileting Toileting Assistive Devices: Grab bar or rail  Toileting assist     Transfers Chair/bed transfer   Chair/bed transfer method: Stand pivot Chair/bed transfer assist level: Touching or steadying assistance (Pt > 75%) Chair/bed transfer assistive device:  (HHA)     Locomotion Ambulation     Max distance: 22 ft Assist level: Touching or steadying assistance  (Pt > 75%)   Wheelchair Wheelchair activity did not occur: Safety/medical concerns        Cognition Comprehension Comprehension assist level: Follows basic conversation/direction with extra time/assistive device  Expression Expression assist level: Expresses basic needs/ideas: With no assist  Social Interaction Social Interaction assist level: Interacts appropriately 75 - 89% of the time - Needs redirection for appropriate language or to initiate interaction.  Problem Solving Problem solving assist level: Solves basic 50 - 74% of the time/requires cueing 25 - 49% of the time  Memory Memory assist level: Recognizes or recalls 50 - 74% of the time/requires cueing 25 - 49% of the time   Medical Problem List and Plan: 1. Decreased functional mobilitysecondary to acute on chronic left traumatic SDH status post bilateral frontal burr hole 02/12/2017  -continue CIR therapies  -weaning steroids 2. DVT Prophylaxis/Anticoagulation: SCDs. Patient is ambulatory 3. Pain Managementwith history of bilateral total hip replacement and chronic back pain: Ultram as needed  -added kpad for back/hips 4. Mood: Wellbutrin 300 mg daily, hydroxyzine 25 mg daily at bedtime 5. Neuropsych: This patient iscapable of making decisions on herown behalf. 6. Skin/Wound Care: Routine skin checks 7. Fluids/Electrolytes/Nutrition: I personally reviewed the patient's labs today.   -BUN increased---push PO-, held lasix  -recheck on Sunday 8.Hypertension. Norvasc 10 mg daily, Lasix 20 mg daily, Cozaar 100 mg daily. Monitor with increased mobility 9.Gout. Allopurinol. Monitor for any gout flareups 10. Overactive bladder. Did trip and 5 mg daily. Check PVR 3 11.Hyperlipidemia. Lipitor 12.GERD. Protonix 13.Constipation. Laxative assistance 14. ABLA: hgb 10.2 today, no signs of blood loss 15. Leukocytosis: 12.9 today. I suspect this is steroid related  -will check ua, ucx as she's emptying frequently 16.  Insomnia/sweating  -weaning steroids should help  -keep room warm  -prn trazodone   LOS (Days) 2 A Schlater T, MD 03/10/2017 9:57 AM

## 2017-03-10 NOTE — Progress Notes (Signed)
Physical Therapy Session Note  Patient Details  Name: DEBORH PENSE MRN: [id number] Date of Birth: 1940/12/13  Today's Date: 03/10/2017 PT Individual Time: 1100-1200 PT Individual Time Calculation (min): 60 min   Short Term Goals: Week 1:  PT Short Term Goal 1 (Week 1): STG = LTG due to short ELOS.  Skilled Therapeutic Interventions/Progress Updates:  Pt was seen bedside in the am. Pt performed all sit to stand transfers with S and stand pivot transfers with min guard and verbal cues. Pt ambulated 125 and 150 feet with 4WW and c/s to min guard with verbal cues. Decreased step length noted B and decreased cadence. In gym treatment focused on NMR utilizing step taps and alternating step taps 3 sets x 10 reps each. Pt ascended/descended 4 stairs with B rails and min to mod A with verbal cues. Pt returned to room following treatmen and left sitting up in recliner with call bell within reach.   Therapy Documentation Precautions:  Precautions Precautions: Fall Restrictions Weight Bearing Restrictions: No General:   Vital Signs:  Pain: No c/o pain.   See Function Navigator for Current Functional Status.   Therapy/Group: Individual Therapy  Dub Amis 03/10/2017, 12:24 PM

## 2017-03-11 ENCOUNTER — Inpatient Hospital Stay (HOSPITAL_COMMUNITY): Payer: Self-pay

## 2017-03-11 LAB — BASIC METABOLIC PANEL
Anion gap: 9 (ref 5–15)
BUN: 27 mg/dL — ABNORMAL HIGH (ref 6–20)
CO2: 23 mmol/L (ref 22–32)
Calcium: 9.1 mg/dL (ref 8.9–10.3)
Chloride: 108 mmol/L (ref 101–111)
Creatinine, Ser: 1.34 mg/dL — ABNORMAL HIGH (ref 0.44–1.00)
GFR calc Af Amer: 44 mL/min — ABNORMAL LOW (ref >60)
GFR calc non Af Amer: 38 mL/min — ABNORMAL LOW (ref >60)
Glucose, Bld: 115 mg/dL — ABNORMAL HIGH (ref 65–99)
Potassium: 4 mmol/L (ref 3.5–5.1)
Sodium: 140 mmol/L (ref 135–145)

## 2017-03-11 LAB — CBC
HCT: 35 % — ABNORMAL LOW (ref 36.0–46.0)
Hemoglobin: 11.5 g/dL — ABNORMAL LOW (ref 12.0–15.0)
MCH: 31.3 pg (ref 26.0–34.0)
MCHC: 32.9 g/dL (ref 30.0–36.0)
MCV: 95.1 fL (ref 78.0–100.0)
Platelets: 267 10*3/uL (ref 150–400)
RBC: 3.68 MIL/uL — ABNORMAL LOW (ref 3.87–5.11)
RDW: 15.8 % — ABNORMAL HIGH (ref 11.5–15.5)
WBC: 11.4 10*3/uL — ABNORMAL HIGH (ref 4.0–10.5)

## 2017-03-11 NOTE — Progress Notes (Signed)
[facility]IC[facility] & REHABILITATION     PROGRESS NOTE    Subjective/Complaints: Did a little better last night although room was still cold.   ROS: pt denies nausea, vomiting, diarrhea, cough, shortness of breath or chest pain   Objective: Vital Signs: Blood pressure 133/60, pulse (!) 58, temperature 98 F (36.7 C), temperature source Oral, resp. rate 16, height 5\' 3"  (1.6 m), SpO2 100 %. No results found.  Recent Labs  03/09/17 0557 03/11/17 0742  WBC 12.9* 11.4*  HGB 10.2* 11.5*  HCT 31.7* 35.0*  PLT 236 267    Recent Labs  03/09/17 0557 03/11/17 0742  NA 139 140  K 4.1 4.0  CL 108 108  GLUCOSE 133* 115*  BUN 26* 27*  CREATININE 1.39* 1.34*  CALCIUM 9.0 9.1   CBG (last 3)  No results for input(s): GLUCAP in the last 72 hours.  Wt Readings from Last 3 Encounters:  03/06/17 90.2 kg (198 lb 13.7 oz)  02/09/17 86.5 kg (190 lb 12.8 oz)  01/26/17 89.6 kg (197 lb 8 oz)    Physical Exam:  Constitutional: She appears well-developed.  HENT:  Head: Normocephalic.  Eyes: EOMI Neck: Normal range of motion. Neck supple. No tracheal deviationpresent. No thyromegalypresent.  Cardiovascular: RRR  Respiratory: CTA B KG:URKY NT/ND, BS+  Skin. Warm and dry Neurological: She is alert and oriented x 3.  Reasonable insight and awareness. Remains a little disinhibited and distracted. Decreased attention Motor: B/l UE 5/5 proximal to distal B/l LE: 4/5 HF, 4/5 Add, 4/5 Hip Abd, KE, 5/5 ADF/PF---motor exam stable Psych: pleasant and cooperative  Assessment/Plan: 1. Functional and cognitive deficits secondary to SDH which require 3+ hours per day of interdisciplinary therapy in a comprehensive inpatient rehab setting. Physiatrist is providing close team supervision and 24 hour management of active medical problems listed below. Physiatrist and rehab team continue to assess barriers to discharge/monitor patient progress toward functional and medical  goals.  Function:  Bathing Bathing position   Position: Shower  Bathing parts Body parts bathed by patient: Right arm, Left arm, Chest, Abdomen, Front perineal area, Buttocks, Right upper leg, Left lower leg, Right lower leg, Left upper leg Body parts bathed by helper: Back  Bathing assist Assist Level: Touching or steadying assistance(Pt > 75%)      Upper Body Dressing/Undressing Upper body dressing   What is the patient wearing?: Bra, Pull over shirt/dress Bra - Perfomed by patient: Thread/unthread right bra strap Bra - Perfomed by helper: Thread/unthread left bra strap, Hook/unhook bra (pull down sports bra) Pull over shirt/dress - Perfomed by patient: Thread/unthread right sleeve, Thread/unthread left sleeve, Put head through opening Pull over shirt/dress - Perfomed by helper: Pull shirt over trunk        Upper body assist        Lower Body Dressing/Undressing Lower body dressing   What is the patient wearing?: Underwear, Pants, Shoes Underwear - Performed by patient: Thread/unthread right underwear leg Underwear - Performed by helper: Thread/unthread left underwear leg, Pull underwear up/down Pants- Performed by patient: Thread/unthread right pants leg, Thread/unthread left pants leg Pants- Performed by helper: Pull pants up/down           Shoes - Performed by helper: Don/doff right shoe, Don/doff left shoe          Lower body assist Assist for lower body dressing: Touching or steadying assistance (Pt > 75%)      Toileting Toileting   Toileting steps completed by patient: Performs perineal hygiene (  no underware) Toileting steps completed by helper: Adjust clothing prior to toileting, Performs perineal hygiene, Adjust clothing after toileting Toileting Assistive Devices: Grab bar or rail  Toileting assist     Transfers Chair/bed transfer   Chair/bed transfer method: Stand pivot Chair/bed transfer assist level: Supervision or verbal cues Chair/bed transfer  assistive device: Armrests, Medical sales representative     Max distance: 150 Assist level: Touching or steadying assistance (Pt > 75%)   Wheelchair Wheelchair activity did not occur: Safety/medical concerns        Cognition Comprehension Comprehension assist level: Follows basic conversation/direction with no assist  Expression Expression assist level: Expresses basic needs/ideas: With no assist  Social Interaction Social Interaction assist level: Interacts appropriately 90% of the time - Needs monitoring or encouragement for participation or interaction.  Problem Solving Problem solving assist level: Solves basic 50 - 74% of the time/requires cueing 25 - 49% of the time  Memory Memory assist level: Recognizes or recalls 50 - 74% of the time/requires cueing 25 - 49% of the time   Medical Problem List and Plan: 1. Decreased functional mobilitysecondary to acute on chronic left traumatic SDH status post bilateral frontal burr hole 02/12/2017  -continue CIR therapies  -weaning steroids to off 2. DVT Prophylaxis/Anticoagulation: SCDs. Patient is ambulatory 3. Pain Managementwith history of bilateral total hip replacement and chronic back pain: Ultram as needed  -added kpad for back/hips 4. Mood: Wellbutrin 300 mg daily, hydroxyzine 25 mg daily at bedtime 5. Neuropsych: This patient iscapable of making decisions on herown behalf. 6. Skin/Wound Care: Routine skin checks 7. Fluids/Electrolytes/Nutrition: I personally reviewed the patient's labs today.   -BUN increased---pushing PO-, held lasix starting today  -recheck this week 8.Hypertension. Norvasc 10 mg daily, Lasix 20 mg daily, Cozaar 100 mg daily. Monitor with increased mobility 9.Gout. Allopurinol. Monitor for any gout flareups 10. Overactive bladder. Did trip and 5 mg daily. Check PVR 3 11.Hyperlipidemia. Lipitor 12.GERD. Protonix 13.Constipation. Laxative assistance 14. ABLA: hgb 10.2 today, no signs of  blood loss 15. Leukocytosis: 11.4 today. I suspect this is steroid related  -UA is +, UCX pending---await ucx results 16. Insomnia/sweating  -weaning steroids    -keep room warm  -prn trazodone   LOS (Days) 3 A FACE TO FACE EVALUATION WAS PERFORMED  Meredith Staggers, MD 03/11/2017 9:34 AM

## 2017-03-11 NOTE — Progress Notes (Signed)
Occupational Therapy Note  Patient Details  Name: [name] MRN: [id number] Date of Birth: [birth year]  Today's Date: 03/11/2017 OT Individual Time: 1445-1530 OT Individual Time Calculation (min): 45 min   Pt denied pain Individual therapy  Pt amb with 4WW in room with daughter attending.  Reeducated pt and daughter on safety precautions and necessity of staff being present when up in room.  Pt and daughter verbalized understanding.  Pt amb with 4WW to therapy gym and engaged in sit<>stand and standing balance activities on noncompliant surface. Pt amb with 4WW to ADL apartment and engaged in simple home mgmt tasks with focus on safety.  Pt requires min verbal cues to lock brakes and reach back when performing sit<>stand.  Pt amb with 4WW in hallway to day room and back to room.  Pt required rest breaks X 2 and utilized seat on 4WW to rest.  Pt returned to room and recliner.  Pt remained in recliner with all needs within reach.  Pt reeducated on importance of calling staff if she needed to use bathroom.  Pt verbalized understanding.     Leotis Shames [facility] 03/11/2017, 3:35 PM

## 2017-03-12 ENCOUNTER — Inpatient Hospital Stay (HOSPITAL_COMMUNITY): Payer: Self-pay | Admitting: Occupational Therapy

## 2017-03-12 ENCOUNTER — Inpatient Hospital Stay (HOSPITAL_COMMUNITY): Payer: PPO | Admitting: Speech Pathology

## 2017-03-12 ENCOUNTER — Inpatient Hospital Stay (HOSPITAL_COMMUNITY): Payer: PPO | Admitting: Occupational Therapy

## 2017-03-12 ENCOUNTER — Inpatient Hospital Stay (HOSPITAL_COMMUNITY): Payer: PPO | Admitting: Physical Therapy

## 2017-03-12 DIAGNOSIS — A499 Bacterial infection, unspecified: Secondary | ICD-10-CM

## 2017-03-12 DIAGNOSIS — N390 Urinary tract infection, site not specified: Secondary | ICD-10-CM

## 2017-03-12 LAB — URINE CULTURE: Culture: >=100000 — AB

## 2017-03-12 MED ORDER — AMOXICILLIN 250 MG PO CAPS
250.0000 mg | ORAL_CAPSULE | Freq: Three times a day (TID) | ORAL | Status: DC
  Administered 2017-03-12: 250 mg via ORAL
  Filled 2017-03-12: qty 1

## 2017-03-12 MED ORDER — CEPHALEXIN 250 MG PO CAPS
250.0000 mg | ORAL_CAPSULE | Freq: Four times a day (QID) | ORAL | Status: AC
  Administered 2017-03-12 – 2017-03-17 (×19): 250 mg via ORAL
  Filled 2017-03-12 (×19): qty 1

## 2017-03-12 NOTE — Progress Notes (Signed)
[place] PHYSICAL MEDICINE & REHABILITATION     PROGRESS NOTE    Subjective/Complaints: Had a much better night. Didn't feel as cold. Appreciative of team  ROS: pt denies nausea, vomiting, diarrhea, cough, shortness of breath or chest pain   Objective: Vital Signs: Blood pressure 130/60, pulse 60, temperature 98 F (36.7 C), temperature source Oral, resp. rate 18, height 5\' 3"  (1.6 m), SpO2 98 %. No results found.  Recent Labs  03/11/17 0742  WBC 11.4*  HGB 11.5*  HCT 35.0*  PLT 267    Recent Labs  03/11/17 0742  NA 140  K 4.0  CL 108  GLUCOSE 115*  BUN 27*  CREATININE 1.34*  CALCIUM 9.1   CBG (last 3)  No results for input(s): GLUCAP in the last 72 hours.  Wt Readings from Last 3 Encounters:  03/06/17 90.2 kg (198 lb 13.7 oz)  02/09/17 86.5 kg (190 lb 12.8 oz)  01/26/17 89.6 kg (197 lb 8 oz)    Physical Exam:  Constitutional: She appears well-developed.  HENT:  Head: Normocephalic.  Eyes: EOMI Neck: Normal range of motion. Neck supple. No tracheal deviationpresent. No thyromegalypresent.  Cardiovascular: RRR  Respiratory: CTA B OY:DXAJ NT/ND, BS+  Skin. Warm and dry Neurological: She is alert and oriented x 3. HOH Reasonable insight and awareness. Remains a little disinhibited and distracted. Decreased attention---some of this baseline??? Motor: B/l UE 5/5 proximal to distal B/l LE: 4/5 HF, 4/5 Add, 4/5 Hip Abd, KE, 5/5 ADF/PF---motor exam stable Psych: pleasant and cooperative  Assessment/Plan: 1. Functional and cognitive deficits secondary to SDH which require 3+ hours per day of interdisciplinary therapy in a comprehensive inpatient rehab setting. Physiatrist is providing close team supervision and 24 hour management of active medical problems listed below. Physiatrist and rehab team continue to assess barriers to discharge/monitor patient progress toward functional and medical goals.  Function:  Bathing Bathing position   Position:  Shower  Bathing parts Body parts bathed by patient: Right arm, Left arm, Chest, Abdomen, Front perineal area, Buttocks, Right upper leg, Left lower leg, Right lower leg, Left upper leg Body parts bathed by helper: Back  Bathing assist Assist Level: Touching or steadying assistance(Pt > 75%)      Upper Body Dressing/Undressing Upper body dressing   What is the patient wearing?: Bra, Pull over shirt/dress Bra - Perfomed by patient: Thread/unthread right bra strap Bra - Perfomed by helper: Thread/unthread left bra strap, Hook/unhook bra (pull down sports bra) Pull over shirt/dress - Perfomed by patient: Thread/unthread right sleeve, Thread/unthread left sleeve, Put head through opening Pull over shirt/dress - Perfomed by helper: Pull shirt over trunk        Upper body assist        Lower Body Dressing/Undressing Lower body dressing   What is the patient wearing?: Underwear, Pants, Shoes Underwear - Performed by patient: Thread/unthread right underwear leg Underwear - Performed by helper: Thread/unthread left underwear leg, Pull underwear up/down Pants- Performed by patient: Thread/unthread right pants leg, Thread/unthread left pants leg Pants- Performed by helper: Pull pants up/down           Shoes - Performed by helper: Don/doff right shoe, Don/doff left shoe          Lower body assist Assist for lower body dressing: Touching or steadying assistance (Pt > 75%)      Toileting Toileting   Toileting steps completed by patient: Performs perineal hygiene (no underware) Toileting steps completed by helper: Adjust clothing prior to toileting, Performs  perineal hygiene, Adjust clothing after toileting Toileting Assistive Devices: Grab bar or rail  Toileting assist     Transfers Chair/bed transfer   Chair/bed transfer method: Stand pivot Chair/bed transfer assist level: Supervision or verbal cues Chair/bed transfer assistive device: Armrests, Environmental health practitioner     Max distance: 150 Assist level: Touching or steadying assistance (Pt > 75%)   Wheelchair Wheelchair activity did not occur: Safety/medical concerns        Cognition Comprehension Comprehension assist level: Follows basic conversation/direction with no assist  Expression Expression assist level: Expresses basic needs/ideas: With no assist  Social Interaction Social Interaction assist level: Interacts appropriately 90% of the time - Needs monitoring or encouragement for participation or interaction.  Problem Solving Problem solving assist level: Solves basic 50 - 74% of the time/requires cueing 25 - 49% of the time  Memory Memory assist level: Recognizes or recalls 50 - 74% of the time/requires cueing 25 - 49% of the time   Medical Problem List and Plan: 1. Decreased functional mobilitysecondary to acute on chronic left traumatic SDH status post bilateral frontal burr hole 02/12/2017  -continue CIR therapies  -weaning steroids to off.  2. DVT Prophylaxis/Anticoagulation: SCDs. Patient is ambulatory 3. Pain Managementwith history of bilateral total hip replacement and chronic back pain: Ultram as needed  -added kpad for back/hips 4. Mood: Wellbutrin 300 mg daily, hydroxyzine 25 mg daily at bedtime 5. Neuropsych: This patient iscapable of making decisions on herown behalf. 6. Skin/Wound Care: Routine skin checks 7. Fluids/Electrolytes/Nutrition: I personally reviewed the patient's labs today.   -BUN increased---pushing PO-, held lasix starting today  -recheck Tomorrow 8.Hypertension. Norvasc 10 mg daily, Lasix 20 mg daily, Cozaar 100 mg daily. Monitor with increased mobility 9.Gout. Allopurinol. Monitor for any gout flareups 10. Overactive bladder. Ditropan 5 mg daily. Check PVR 3 11.Hyperlipidemia. Lipitor 12.GERD. Protonix 13.Constipation. Laxative assistance 14. ABLA: hgb 10.2 today, no signs of blood loss 15. Leukocytosis: 11.4 today. I  suspect this is steroid related  -UCX + for Ecoli 100k---begin empiric amoxil 16. Insomnia/sweating: improving  -weaning steroids    -keep room warm  -prn trazodone   LOS (Days) 4 A FACE TO FACE EVALUATION WAS PERFORMED  Meredith Staggers, MD 03/12/2017 9:08 AM

## 2017-03-12 NOTE — Progress Notes (Signed)
Speech Language Pathology Daily Session Note  Patient Details  Name: [name] MRN: [id number] Date of Birth: 12-Jun-1941  Today's Date: 03/12/2017 SLP Individual Time: 0930-1000 SLP Individual Time Calculation (min): 30 min  Short Term Goals: Week 1: SLP Short Term Goal 1 (Week 1): Pt will recall new, complex, daily information with min assist verbal cues to use external aids.   SLP Short Term Goal 2 (Week 1): Pt will complete semi-complex tasks wtih min assist verbal cues for functional problem solving.   SLP Short Term Goal 3 (Week 1): Pt will recognize and correct errors during semi-complex tasks wtih mina ssist verbal cues.    Skilled Therapeutic Interventions: Skilled treatment session focused on cognitive goals. SLP facilitated session by providing Min A verbal cues for recall of his current medications and their functions. Patient also recalled events from previous therapy sessions with supervision verbal cues. Patient left upright in recliner with all needs within reach. Continue with current plan of care.      Function:   Cognition Comprehension Comprehension assist level: Follows basic conversation/direction with no assist  Expression   Expression assist level: Expresses basic needs/ideas: With no assist  Social Interaction Social Interaction assist level: Interacts appropriately 90% of the time - Needs monitoring or encouragement for participation or interaction.  Problem Solving Problem solving assist level: Solves basic 50 - 74% of the time/requires cueing 25 - 49% of the time  Memory Memory assist level: Recognizes or recalls 50 - 74% of the time/requires cueing 25 - 49% of the time    Pain No/Denies Pain  Therapy/Group: Individual Therapy  Maddox Hlavaty 03/12/2017, 3:01 PM

## 2017-03-12 NOTE — Progress Notes (Signed)
Physical Therapy Session Note  Patient Details  Name: [name] MRN: [id number] Date of Birth: 08-17-1941  Today's Date: 03/12/2017 PT Individual Time: 9292-4462 PT Individual Time Calculation (min): 55 min   Short Term Goals: Week 1:  PT Short Term Goal 1 (Week 1): STG = LTG due to short ELOS.  Skilled Therapeutic Interventions/Progress Updates:  Pt received in room & agreeable to tx. Session focused on endurance during gait training, functional mobility, and Berg Balance testing. Pt ambulated room<>gym with rollator and supervision with decreased gait speed, decreased step length, & decreased stride length. Pt reported need to use restroom and ambulated within room<>bathroom with rollator and supervision. Pt completed toilet transfer with supervision to elevated seat; pt with continent void & performed peri-hygiene with supervision overall. Pt completed Berg Balance Test & scored 27/56; educated pt on interpretation of score & current fall risk.  Patient demonstrates increased fall risk as noted by score of 27/56 on Berg Balance Scale.  (<36= high risk for falls, close to 100%; 37-45 significant >80%; 46-51 moderate >50%; 52-55 lower >25%). At end of session pt left sitting in recliner in room with all needs within reach.   Pt reports being "fearful" over activities where she experiences decreased balance; therapist provided support, education, and encouragement.   Therapy Documentation Precautions:  Precautions Precautions: Fall Restrictions Weight Bearing Restrictions: No  Pain: Denies c/o pain, reports "my eyes feel funny" after being outside - RN made aware.   Balance: Balance Balance Assessed: Yes Standardized Balance Assessment Standardized Balance Assessment: Berg Balance Test Berg Balance Test Sit to Stand: Able to stand without using hands and stabilize independently Standing Unsupported: Able to stand 2 minutes with supervision Sitting with Back Unsupported but  Feet Supported on Floor or Stool: Able to sit safely and securely 2 minutes Stand to Sit: Uses backs of legs against chair to control descent Transfers: Able to transfer with verbal cueing and /or supervision Standing Unsupported with Eyes Closed: Able to stand 10 seconds with supervision Standing Ubsupported with Feet Together: Able to place feet together independently and stand for 1 minute with supervision From Standing, Reach Forward with Outstretched Arm: Reaches forward but needs supervision (Pt reached 3in) From Standing Position, Pick up Object from Floor: Able to pick up shoe, needs supervision From Standing Position, Turn to Look Behind Over each Shoulder: Needs supervision when turning Turn 360 Degrees: Needs close supervision or verbal cueing Standing Unsupported, Alternately Place Feet on Step/Stool: Needs assistance to keep from falling or unable to try Standing Unsupported, One Foot in Front: Loses balance while stepping or standing Standing on One Leg: Unable to try or needs assist to prevent fall Total Score: 27   See Function Navigator for Current Functional Status.   Therapy/Group: Individual Therapy  Waunita Schooner 03/12/2017, 5:18 PM

## 2017-03-12 NOTE — Progress Notes (Signed)
Occupational Therapy Session Note  Patient Details  Name: [name] MRN: [id number] Date of Birth: 04-Sep-1941  Today's Date: 03/12/2017 OT Individual Time: 1347-1501 OT Individual Time Calculation (min): 74 min    Short Term Goals: Week 1:  OT Short Term Goal 1 (Week 1): Pt will complete stand pivot transfer to toilet with min A OT Short Term Goal 2 (Week 1): Pt will stand to complete grooming task with steadying assist to increase functional activity tolerance OT Short Term Goal 3 (Week 1): Pt will complete clothing management during toileting task with no more than mod steadying assist OT Short Term Goal 4 (Week 1): Pt will dress UB with set-up assist  Skilled Therapeutic Interventions/Progress Updates:  Tx focus on walker safety, functional ambulation in community setting, and cognitive remediation.   Pt greeted in recliner, agreeable to tx. She ambulated with 4WO and supervision to bathroom for toileting. Afterwards pt was escorted to outdoor environment. She ambulated (with Min A) over uneven surfaces, transferred to 2 benches, and watered plants with small watering can placed on seat of 4WW. Pt required max cues for walker safety and instructional cues for problem solving walker placement when transferring onto surfaces in community. Pt was then escorted back to unit, required questioning cues to locate room. Pt completed sit<stand with walker backwards (pt locking brakes with instruction from seated position). She required instructional cues to acknowledge error. Once walker was oriented correctly by OT, pt ambulated back to recliner. She was left with all needs within reach at time of departure.      Therapy Documentation Precautions:  Precautions Precautions: Fall Restrictions Weight Bearing Restrictions: No   Vital Signs: Therapy Vitals Temp: 98.6 F (37 C) Temp Source: Oral Pulse Rate: 64 Resp: 17 BP: (!) 114/53 Patient Position (if appropriate): Sitting Oxygen  Therapy SpO2: 97 % O2 Device: Not Delivered Pain: No c/o pain during tx    ADL:      See Function Navigator for Current Functional Status.   Therapy/Group: Individual Therapy  Yola Paradiso A Shirla Hodgkiss 03/12/2017, 4:26 PM

## 2017-03-12 NOTE — Progress Notes (Signed)
Occupational Therapy Session Note  Patient Details  Name: [name] MRN: [id number] Date of Birth: Sep 16, 1941  Today's Date: 03/12/2017 OT Individual Time: 2025-4270 and 6237-6283 OT Individual Time Calculation (min): 60 min and 64   Short Term Goals: Week 1:  OT Short Term Goal 1 (Week 1): Pt will complete stand pivot transfer to toilet with min A OT Short Term Goal 2 (Week 1): Pt will stand to complete grooming task with steadying assist to increase functional activity tolerance OT Short Term Goal 3 (Week 1): Pt will complete clothing management during toileting task with no more than mod steadying assist OT Short Term Goal 4 (Week 1): Pt will dress UB with set-up assist  Skilled Therapeutic Interventions/Progress Updates:    Session 1: Upon entering the room, pt requesting to shower. Pt ambulating with steady assistance with rollator to obtain clothing items from dresser. Pt needing cues throughout the session to lock rollater wheels for safety. Pt performed shower transfer with steady assistance. Pt bathing self at shower level and standing to wash buttocks and peri area with steady assistance for balance. Pt transitioning to sit on toilet for dressing tasks with overall min A for balance for LB clothing management. Pt returning to recliner chair to rest at end of session secondary to fatigue. Call bell and all needed items within reach upon exiting the room.   Session 2: Upon entering the room, pt seated in recliner chair awaiting therapist with no c/o pain this session. Pt verbalizing need for toileting and ambulation with rollator to bathroom with steady assistance. Pt performed transfer, clothing management, and hygiene with overall steady assistance. Pt returned to recliner chair for B UE strengthening exercises with use of level 2 resistive theraband. Pt performed 2 sets of 10 chest pulls, shoulder diagonals, bicep curls, and shoulder flexion with min verbal cues for proper  technique. Pt remained in recliner chair at end of session with call bell and all needed items within reach.   Therapy Documentation Precautions:  Precautions Precautions: Fall Restrictions Weight Bearing Restrictions: No General:   Vital Signs:   Pain:   ADL:   Vision   Perception    Praxis   Exercises:   Other Treatments:    See Function Navigator for Current Functional Status.   Therapy/Group: Individual Therapy  Gypsy Decant 03/12/2017, 12:49 PM

## 2017-03-13 ENCOUNTER — Inpatient Hospital Stay (HOSPITAL_COMMUNITY): Payer: PPO | Admitting: Physical Therapy

## 2017-03-13 ENCOUNTER — Inpatient Hospital Stay (HOSPITAL_COMMUNITY): Payer: PPO

## 2017-03-13 ENCOUNTER — Inpatient Hospital Stay (HOSPITAL_COMMUNITY): Payer: PPO | Admitting: Speech Pathology

## 2017-03-13 ENCOUNTER — Inpatient Hospital Stay (HOSPITAL_COMMUNITY): Payer: PPO | Admitting: Occupational Therapy

## 2017-03-13 LAB — BASIC METABOLIC PANEL
Anion gap: 6 (ref 5–15)
BUN: 28 mg/dL — ABNORMAL HIGH (ref 6–20)
CO2: 23 mmol/L (ref 22–32)
Calcium: 8.7 mg/dL — ABNORMAL LOW (ref 8.9–10.3)
Chloride: 109 mmol/L (ref 101–111)
Creatinine, Ser: 1.26 mg/dL — ABNORMAL HIGH (ref 0.44–1.00)
GFR calc Af Amer: 47 mL/min — ABNORMAL LOW (ref >60)
GFR calc non Af Amer: 41 mL/min — ABNORMAL LOW (ref >60)
Glucose, Bld: 111 mg/dL — ABNORMAL HIGH (ref 65–99)
Potassium: 4.5 mmol/L (ref 3.5–5.1)
Sodium: 138 mmol/L (ref 135–145)

## 2017-03-13 MED ORDER — DEXAMETHASONE 2 MG PO TABS
2.0000 mg | ORAL_TABLET | Freq: Three times a day (TID) | ORAL | Status: DC
  Administered 2017-03-13 – 2017-03-15 (×6): 2 mg via ORAL
  Filled 2017-03-13 (×6): qty 1

## 2017-03-13 MED ORDER — FUROSEMIDE 20 MG PO TABS: 20.0000 mg | ORAL_TABLET | Freq: Every day | ORAL | Status: DC

## 2017-03-13 MED ORDER — ALPRAZOLAM 0.25 MG PO TABS
0.2500 mg | ORAL_TABLET | Freq: Three times a day (TID) | ORAL | Status: DC | PRN
  Administered 2017-03-13: 0.25 mg via ORAL
  Filled 2017-03-13: qty 1

## 2017-03-13 NOTE — Progress Notes (Signed)
Speech Language Pathology Daily Session Note  Patient Details  Name: [name] MRN: [id number] Date of Birth: 02-21-1941  Today's Date: 03/13/2017 SLP Individual Time: 0915-1000 SLP Individual Time Calculation (min): 45 min  Short Term Goals: Week 1: SLP Short Term Goal 1 (Week 1): Pt will recall new, complex, daily information with min assist verbal cues to use external aids.   SLP Short Term Goal 2 (Week 1): Pt will complete semi-complex tasks wtih min assist verbal cues for functional problem solving.   SLP Short Term Goal 3 (Week 1): Pt will recognize and correct errors during semi-complex tasks wtih mina ssist verbal cues.    Skilled Therapeutic Interventions: Skilled treatment session focused on cognitive goals. SLP facilitated session by providing Min A verbal cues for organization of a QD pill box with current medications. Patient appeared distracted this session and was perseverative on finding a newspaper and finding out information about friend that has been hospitalized and required frequent redirection. By end of session, patient appeared calm and was left in recliner with all needs within reach. Continue with current plan of care.       Function:  Cognition Comprehension Comprehension assist level: Understands complex 90% of the time/cues 10% of the time  Expression   Expression assist level: Expresses basic needs/ideas: With no assist  Social Interaction Social Interaction assist level: Interacts appropriately with others with medication or extra time (anti-anxiety, antidepressant).  Problem Solving Problem solving assist level: Solves basic 90% of the time/requires cueing < 10% of the time  Memory Memory assist level: Recognizes or recalls 50 - 74% of the time/requires cueing 25 - 49% of the time    Pain No/Denies Pain   Therapy/Group: Individual Therapy  Kyndle Schlender, [place] 03/13/2017, 3:26 PM

## 2017-03-13 NOTE — Progress Notes (Signed)
Physical Therapy Session Note  Patient Details  Name: [name] MRN: [id number] Date of Birth: [birth year]  Today's Date: 03/13/2017 PT Individual Time: 0835-0900 and 1400-1430 and 6270-3500 PT Individual Time Calculation (min): 25 min and 30 min and 76 min  Short Term Goals: Week 1:  PT Short Term Goal 1 (Week 1): STG = LTG due to short ELOS.  Skilled Therapeutic Interventions/Progress Updates:  Treatment 1: Pt received in chair reporting HA ("feels like a rolling pin over my head") & adamantly requesting to eat breakfast. Therapist provided pt time to eat breakfast and then pt agreeable to tx, reporting ice is helping relieve her HA. Pt donned shoes with extra time and set up assistance. Pt ambulated in hallway & around nurses station with rollator and supervision for endurance training; pt reports fatigue towards end of activity. Pt continues to demonstrate shuffled gait pattern and decreased gait speed overall. Pt requires cuing for obstacle avoidance on R. At end of session pt left sitting in recliner in room with all needs within reach.   Pt appears to have significant anxiety this morning over breakfast and feeling rushed to finish in time for therapy; RN made aware.    Treatment 2: Pt received in room & agreeable to tx session, denying c/o pain. Pt less anxious this session, reporting "I took my psychiatric pill" but also stating "I'm fearful of open spaces. I'm fearful of falling". Pt's daughter [name]) on telephone asking to speak with this therapist. [name] inquired about pt's d/c date, family training, recovery process, and current functional mobility - therapist provided education. Pt completed sit<>stand transfers with occasional cuing for managing rollator breaks. Pt ambulated room>around nurses station with rollator and supervision for endurance training. Pt completed 5x sit<>stand without BUE support x 2 trials for BLE strengthening. At end of session pt left sitting in recliner  in room with all needs within reach. Pt with c/o SOB, but SpO2 = 100%.   Treatment 3: Pt received in room & agreeable to tx. Pt denied c/o pain but reporting fatigue. Session focused on functional mobility (transfers, gait, bed mobility) and cognitive remediation. Pt requires cuing for managing w/c brakes before transferring sit<>stand. Pt ambulates room>apartment>gym>room with rollator and supervision, pt requires seated rest breaks 2/2 fatigue & SOB. Pt completes bed mobility in apartment with supervision and cuing to scoot back onto bed once sitting, before transferring to supine (pt very fearful of falling off of bed). In gym pt performed standing hip/knee flexion exercises (pt's AROM decreases after 2-3 reps 2/2 fatigue), heel raises, and hip abduction with multimodal cuing for technique. Pt with poor attention to task, as she is unable to correctly count number of repetitions for exercises. Back in room pt's daughter [name]) arrived; discussed d/c plans, f/u OPPT, and DME recommendations. [name] reports she plans to take pt to her house where she only has a small threshold to enter door. Also discussed pt's current physical and cognitive status and [name] very open to conversation. At end of session pt left sitting in recliner set up with meal tray, all needs within reach, & daughter present.  Throughout session pt expressed her "fear of falling takes over my whole mind & body" as well as described various limitations; therapist provided encouragement and education on current level of functional abilities and mobility.   Therapy Documentation Precautions:  Precautions Precautions: Fall Restrictions Weight Bearing Restrictions: No    See Function Navigator for Current Functional Status.   Therapy/Group: [place]  Loleta Chance 03/13/2017, 5:34 PM

## 2017-03-13 NOTE — Progress Notes (Signed)
Occupational Therapy Session Note  Patient Details  Name: [name] MRN: [id number] Date of Birth: September 01, 1941  Today's Date: 03/13/2017 OT Individual Time: 1300-1358 OT Individual Time Calculation (min): 58 min    Short Term Goals: Week 1:  OT Short Term Goal 1 (Week 1): Pt will complete stand pivot transfer to toilet with min A OT Short Term Goal 2 (Week 1): Pt will stand to complete grooming task with steadying assist to increase functional activity tolerance OT Short Term Goal 3 (Week 1): Pt will complete clothing management during toileting task with no more than mod steadying assist OT Short Term Goal 4 (Week 1): Pt will dress UB with set-up assist  Skilled Therapeutic Interventions/Progress Updates:    Upon entering the room, pt seated in recliner chair with home aide present in room. Pt with no c/o pain but reports feeling very anxious earlier in the morning. Pt reports feeling much calmer now. OT discussed some strategies when having feelings of being overwhelmed. Pt requesting to take shower this session. Clothing gathered onto bed and pt ambulating with rollator to obtain clothing and ambulate into bathroom with supervision. Pt required min cues to lock rollator breaks as needed for safety during session. Pt needing steady assistance with balance during clothing management with toileting. Pt transferred onto TTB with min HHA as she left rollator behind even after cues. Pt bathing from shower level with supervision but need of steady assistance when standing to wash buttocks and peri area. Pt transferred onto toilet for dressing tasks. Min A needed for LB self care and supervision for UB dressing. Pt seated on rollator to brush teeth at sink with cues to lock breaks and rollator placement for safety. Pt returning to recliner chair at end of session with call bell and all needed items within reach.   Therapy Documentation Precautions:  Precautions Precautions:  Fall Restrictions Weight Bearing Restrictions: No General:   Vital Signs:   Pain: Pain Assessment Pain Assessment: No/denies pain  See Function Navigator for Current Functional Status.   Therapy/Group: Individual Therapy  Gypsy Decant 03/13/2017, 2:19 PM

## 2017-03-13 NOTE — Progress Notes (Signed)
[place] PHYSICAL MEDICINE & REHABILITATION     PROGRESS NOTE    Subjective/Complaints: Had pain over left face/eye---states it was "burning and swelling"---received some ice/pain medication which seems to have helped. Asked if it was bothering her now and she said no  ROS: pt denies nausea, vomiting, diarrhea, cough, shortness of breath or chest pain  Objective: Vital Signs: Blood pressure 131/65, pulse 60, temperature 98.6 F (37 C), temperature source Oral, resp. rate 16, height 5\' 3"  (1.6 m), SpO2 98 %. No results found.  Recent Labs  03/11/17 0742  WBC 11.4*  HGB 11.5*  HCT 35.0*  PLT 267    Recent Labs  03/11/17 0742 03/13/17 0735  NA 140 138  K 4.0 4.5  CL 108 109  GLUCOSE 115* 111*  BUN 27* 28*  CREATININE 1.34* 1.26*  CALCIUM 9.1 8.7*   CBG (last 3)  No results for input(s): GLUCAP in the last 72 hours.  Wt Readings from Last 3 Encounters:  03/06/17 90.2 kg (198 lb 13.7 oz)  02/09/17 86.5 kg (190 lb 12.8 oz)  01/26/17 89.6 kg (197 lb 8 oz)    Physical Exam:  Constitutional: She appears well-developed.  HENT:  Head: Normocephalic.  Eyes: EOMI. No swelling. anicteric Neck: Normal range of motion. Neck supple. No tracheal deviationpresent. No thyromegalypresent.  Cardiovascular: RRR Respiratory: cta b SW:HQPR NT/ND, BS+  Skin. Warm and dry Neurological: She is alert and oriented x 3. HOH Reasonable insight and awareness. Remains a little disinhibited and distracted. Decreased attention---some of this baseline??? Motor: B/l UE 5/5 proximal to distal B/l LE: 4/5 HF, 4/5 Add, 4/5 Hip Abd, KE, 5/5 ADF/PF---motor exam stable Psych: pleasant and cooperative  Assessment/Plan: 1. Functional and cognitive deficits secondary to SDH which require 3+ hours per day of interdisciplinary therapy in a comprehensive inpatient rehab setting. Physiatrist is providing close team supervision and 24 hour management of active medical problems listed  below. Physiatrist and rehab team continue to assess barriers to discharge/monitor patient progress toward functional and medical goals.  Function:  Bathing Bathing position   Position: Shower  Bathing parts Body parts bathed by patient: Right arm, Left arm, Chest, Abdomen, Front perineal area, Buttocks, Right upper leg, Left lower leg, Right lower leg, Left upper leg Body parts bathed by helper: Back  Bathing assist Assist Level: Touching or steadying assistance(Pt > 75%)      Upper Body Dressing/Undressing Upper body dressing   What is the patient wearing?: Bra, Pull over shirt/dress Bra - Perfomed by patient: Thread/unthread right bra strap, Thread/unthread left bra strap Bra - Perfomed by helper: Hook/unhook bra (pull down sports bra) Pull over shirt/dress - Perfomed by patient: Thread/unthread right sleeve, Thread/unthread left sleeve, Put head through opening Pull over shirt/dress - Perfomed by helper: Pull shirt over trunk        Upper body assist Assist Level:  (min A)      Lower Body Dressing/Undressing Lower body dressing   What is the patient wearing?: Underwear, Pants, Non-skid slipper socks Underwear - Performed by patient: Thread/unthread right underwear leg, Thread/unthread left underwear leg Underwear - Performed by helper: Pull underwear up/down Pants- Performed by patient: Thread/unthread right pants leg, Thread/unthread left pants leg Pants- Performed by helper: Pull pants up/down           Shoes - Performed by helper: Don/doff right shoe, Don/doff left shoe          Lower body assist Assist for lower body dressing:  (mod A)  Toileting Toileting   Toileting steps completed by patient: Performs perineal hygiene, Adjust clothing prior to toileting, Adjust clothing after toileting Toileting steps completed by helper: Adjust clothing prior to toileting, Performs perineal hygiene, Adjust clothing after toileting Toileting Assistive Devices: Other  (comment) (RW)  Toileting assist Assist level: Supervision or verbal cues   Transfers Chair/bed transfer   Chair/bed transfer method: Stand pivot Chair/bed transfer assist level: Supervision or verbal cues Chair/bed transfer assistive device: Armrests, Medical sales representative     Max distance: 120 ft  Assist level: Supervision or verbal cues   Wheelchair Wheelchair activity did not occur: Safety/medical concerns        Cognition Comprehension Comprehension assist level: Follows basic conversation/direction with no assist  Expression Expression assist level: Expresses basic needs/ideas: With no assist  Social Interaction Social Interaction assist level: Interacts appropriately 90% of the time - Needs monitoring or encouragement for participation or interaction.  Problem Solving Problem solving assist level: Solves basic 50 - 74% of the time/requires cueing 25 - 49% of the time  Memory Memory assist level: Recognizes or recalls 50 - 74% of the time/requires cueing 25 - 49% of the time   Medical Problem List and Plan: 1. Decreased functional mobilitysecondary to acute on chronic left traumatic SDH status post bilateral frontal burr hole 02/12/2017  -continue CIR therapies  -weaning steroids to off.  2. DVT Prophylaxis/Anticoagulation: SCDs. Patient is ambulatory 3. Pain Managementwith history of bilateral total hip replacement and chronic back pain: Ultram as needed  -added kpad for back/hips 4. Mood: Wellbutrin 300 mg daily, hydroxyzine 25 mg daily at bedtime 5. Neuropsych: This patient iscapable of making decisions on herown behalf. 6. Skin/Wound Care: Routine skin checks 7. Fluids/Electrolytes/Nutrition: I personally reviewed the patient's labs today.   -BUN increased---pushing PO-, held lasix starting today  -recheck Tomorrow 8.Hypertension. Norvasc 10 mg daily, Lasix 20 mg daily, Cozaar 100 mg daily. Monitor with increased mobility 9.Gout. Allopurinol.  Monitor for any gout flareups 10. Overactive bladder. Ditropan 5 mg daily. Check PVR 3 11.Hyperlipidemia. Lipitor 12.GERD. Protonix 13.Constipation. Laxative assistance 14. ABLA: hgb 10.2 today, no signs of blood loss 15. Leukocytosis: 11.4 today. I suspect this is steroid related  -UCX + for Ecoli 100k---begin empiric amoxil 16. Insomnia/sweating: improving  -weaning steroids    -keep room warm  -prn trazodone   LOS (Days) 5 A FACE TO FACE EVALUATION WAS PERFORMED  Meredith Staggers, MD 03/13/2017 8:50 AM

## 2017-03-13 NOTE — Progress Notes (Signed)
Physical Therapy Session Note  Patient Details  Name: [name] MRN: [id number] Date of Birth: September 17, 1941  Today's Date: 03/13/2017 PT Individual Time: 7654-6503 PT Individual Time Calculation (min): 45 min   Short Term Goals: Week 1:  PT Short Term Goal 1 (Week 1): STG = LTG due to short ELOS.  Skilled Therapeutic Interventions/Progress Updates:    Initially reporting increased fatigue today but very agreeable to session. Completed toileting needs prior to leaving room with overall distant supervision for gait and transfer in/out of the bathroom and balance while performing hand hygiene at sink. Focused on functional endurance, gait with rollator with focus on increasing step length and maintaining upright posture, and Nustep for NMR for reciprocal movement pattern retraining and overall strengthening/endurance x 14 min on level 4 with BUE/BLE. Pt with supervision verbal cues for attention to R for obstacle negotiation during mobility in room and on unit. Pt requires 1 seated rest break on the way to the gym but able to walk back to room at end of session without need for rest break - reports feeling more awake than when she started this session.    Therapy Documentation Precautions:  Precautions Precautions: Fall Restrictions Weight Bearing Restrictions: No  Pain:  Denies pain.   See Function Navigator for Current Functional Status.   Therapy/Group: Individual Therapy  Canary Brim Ivory Broad, PT, DPT  03/13/2017, 3:32 PM

## 2017-03-14 ENCOUNTER — Inpatient Hospital Stay (HOSPITAL_COMMUNITY): Payer: PPO | Admitting: Physical Therapy

## 2017-03-14 ENCOUNTER — Inpatient Hospital Stay (HOSPITAL_COMMUNITY): Payer: PPO

## 2017-03-14 ENCOUNTER — Encounter (HOSPITAL_COMMUNITY): Payer: Self-pay | Admitting: Psychology

## 2017-03-14 ENCOUNTER — Inpatient Hospital Stay (HOSPITAL_COMMUNITY): Payer: PPO | Admitting: Occupational Therapy

## 2017-03-14 DIAGNOSIS — B9620 Unspecified Escherichia coli [E. coli] as the cause of diseases classified elsewhere: Secondary | ICD-10-CM

## 2017-03-14 NOTE — Progress Notes (Signed)
[place] PHYSICAL MEDICINE & REHABILITATION     PROGRESS NOTE    Subjective/Complaints: Did well last night. Denies pain. Seemed to sleep well. Appetite great  ROS: pt denies nausea, vomiting, diarrhea, cough, shortness of breath or chest pain  Objective: Vital Signs: Blood pressure 120/67, pulse 64, temperature 98 F (36.7 C), temperature source Oral, resp. rate 18, height 5\' 3"  (1.6 m), SpO2 100 %. No results found. No results for input(s): WBC, HGB, HCT, PLT in the last 72 hours.  Recent Labs  03/13/17 0735  NA 138  K 4.5  CL 109  GLUCOSE 111*  BUN 28*  CREATININE 1.26*  CALCIUM 8.7*   CBG (last 3)  No results for input(s): GLUCAP in the last 72 hours.  Wt Readings from Last 3 Encounters:  03/06/17 90.2 kg (198 lb 13.7 oz)  02/09/17 86.5 kg (190 lb 12.8 oz)  01/26/17 89.6 kg (197 lb 8 oz)    Physical Exam:  Constitutional: She appears well-developed.  HENT:  Head: Normocephalic.  Eyes: EOMI. No swelling. anicteric Neck: Normal range of motion. Neck supple. No tracheal deviationpresent. No thyromegalypresent.  Cardiovascular:  RRR Respiratory: CTA B ZO:XWRU NT/ND, BS+  Skin. Warm and dry Neurological: She is alert and oriented x 3. HOH Reasonable insight and awareness. Remains a little disinhibited and distracted. Decreased attention---some of this baseline??? Motor: B/l UE 5/5 proximal to distal B/l LE: 4/5 HF, 4/5 Add, 4/5 Hip Abd, KE, 5/5 ADF/PF---nearly normal Psych: pleasant and cooperative  Assessment/Plan: 1. Functional and cognitive deficits secondary to SDH which require 3+ hours per day of interdisciplinary therapy in a comprehensive inpatient rehab setting. Physiatrist is providing close team supervision and 24 hour management of active medical problems listed below. Physiatrist and rehab team continue to assess barriers to discharge/monitor patient progress toward functional and medical goals.  Function:  Bathing Bathing position    Position: Shower  Bathing parts Body parts bathed by patient: Right arm, Left arm, Chest, Abdomen, Front perineal area, Buttocks, Right upper leg, Left lower leg, Right lower leg, Left upper leg Body parts bathed by helper: Back  Bathing assist Assist Level: Touching or steadying assistance(Pt > 75%)      Upper Body Dressing/Undressing Upper body dressing   What is the patient wearing?: Pull over shirt/dress Bra - Perfomed by patient: Thread/unthread right bra strap, Thread/unthread left bra strap Bra - Perfomed by helper: Hook/unhook bra (pull down sports bra) Pull over shirt/dress - Perfomed by patient: Thread/unthread right sleeve, Thread/unthread left sleeve, Put head through opening, Pull shirt over trunk Pull over shirt/dress - Perfomed by helper: Pull shirt over trunk        Upper body assist Assist Level: Supervision or verbal cues      Lower Body Dressing/Undressing Lower body dressing   What is the patient wearing?: Underwear, Pants, Shoes Underwear - Performed by patient: Thread/unthread right underwear leg, Thread/unthread left underwear leg, Pull underwear up/down Underwear - Performed by helper: Pull underwear up/down Pants- Performed by patient: Thread/unthread right pants leg, Thread/unthread left pants leg, Pull pants up/down Pants- Performed by helper: Pull pants up/down           Shoes - Performed by helper: Don/doff right shoe, Don/doff left shoe          Lower body assist Assist for lower body dressing:  (mod A)      Toileting Toileting   Toileting steps completed by patient: Performs perineal hygiene, Adjust clothing prior to toileting, Adjust clothing after toileting Toileting  steps completed by helper: Adjust clothing prior to toileting, Performs perineal hygiene, Adjust clothing after toileting Toileting Assistive Devices: Grab bar or rail, Other (comment) (rollator)  Toileting assist Assist level: More than reasonable time    Transfers Chair/bed transfer   Chair/bed transfer method: Ambulatory Chair/bed transfer assist level: Supervision or verbal cues Chair/bed transfer assistive device: Armrests, Medical sales representative     Max distance: 150 ft  Assist level: Supervision or verbal cues   Wheelchair Wheelchair activity did not occur: Safety/medical concerns        Cognition Comprehension Comprehension assist level: Understands complex 90% of the time/cues 10% of the time  Expression Expression assist level: Expresses basic needs/ideas: With no assist  Social Interaction Social Interaction assist level: Interacts appropriately with others with medication or extra time (anti-anxiety, antidepressant).  Problem Solving Problem solving assist level: Solves basic 90% of the time/requires cueing < 10% of the time  Memory Memory assist level: Recognizes or recalls 50 - 74% of the time/requires cueing 25 - 49% of the time   Medical Problem List and Plan: 1. Decreased functional mobilitysecondary to acute on chronic left traumatic SDH status post bilateral frontal burr hole 02/12/2017  -continue CIR therapies  -weaning steroids to off.  2. DVT Prophylaxis/Anticoagulation: SCDs. Patient is ambulatory 3. Pain Managementwith history of bilateral total hip replacement and chronic back pain: Ultram as needed  -added kpad for back/hips 4. Mood: Wellbutrin 300 mg daily, hydroxyzine 25 mg daily at bedtime 5. Neuropsych: This patient iscapable of making decisions on herown behalf. 6. Skin/Wound Care: Routine skin checks 7. Fluids/Electrolytes/Nutrition: I personally reviewed the patient's labs today.   -BUN remains elevated from baseline. Continue to hold lasix (20mg  daily)  -recheck Friday 8.Hypertension. Norvasc 10 mg daily, Lasix 20 mg daily (held), Cozaar 100 mg daily. Monitor with increased mobility 9.Gout. Allopurinol. Monitor for any gout flareups 10. Overactive bladder. Ditropan 5 mg daily.    11.Hyperlipidemia. Lipitor 12.GERD. Protonix 13.Constipation. Laxative assistance 14. ABLA: hgb 10.2 today, no signs of blood loss 15. Leukocytosis: 11.4 today. I suspect this is steroid related  -UCX + for Ecoli 100k---sensitive to keflex (changed yesterday)--day 2 of 7 16. Insomnia/sweating: improving  -weaning steroids    -keep room warm  -prn trazodone   LOS (Days) 6 A FACE TO FACE EVALUATION WAS PERFORMED  Meredith Staggers, MD 03/14/2017 9:06 AM

## 2017-03-14 NOTE — Progress Notes (Signed)
Occupational Therapy Session Note  Patient Details  Name: [name] MRN: [id number] Date of Birth: [birth year]  Today's Date: 03/14/2017 OT Individual Time: 0700-0800 OT Individual Time Calculation (min): 60 min    Short Term Goals: Week 1:  OT Short Term Goal 1 (Week 1): Pt will complete stand pivot transfer to toilet with min A OT Short Term Goal 2 (Week 1): Pt will stand to complete grooming task with steadying assist to increase functional activity tolerance OT Short Term Goal 3 (Week 1): Pt will complete clothing management during toileting task with no more than mod steadying assist OT Short Term Goal 4 (Week 1): Pt will dress UB with set-up assist  Skilled Therapeutic Interventions/Progress Updates:    Upon entering the room, pt supine in bed with no c/o pain and agreeable to OT intervention. Pt ambulated with rollator to obtain clothing items with supervision. Pt ambulating to bathroom for toileting where she removed clothing while seated on toilet with overall supervision. Pt required min cues for rollator advancement for shower transfer and to lock breaks for safety. Pt completed bathing with min A needed for balance while standing to wash buttocks and peri area. Pt exited bathroom to sit on EOB for dressing tasks. Pt needing assistance with B shoes this session as she was unable to get over heel. OT discussed use of shoe horn next session. Breakfast tray placed in front of pt with call bell and all needed items within reach upon exiting the room.   Therapy Documentation Precautions:  Precautions Precautions: Fall Restrictions Weight Bearing Restrictions: No General:   Vital Signs:   Pain:   ADL:   Vision   Perception    Praxis   Exercises:   Other Treatments:    See Function Navigator for Current Functional Status.   Therapy/Group: Individual Therapy  Gypsy Decant 03/14/2017, 12:38 PM

## 2017-03-14 NOTE — Consult Note (Signed)
Neuropsychological Consultation   Patient:   [name]   DOB:   1940-11-15  MR Number:  [id number]  Location:  [facility] [address] [id number] [place] [address] Dept: [phone] YDX: [phone]           Date of Service:   03/14/2017  Start Time:   10 AM End Time:   10:55 AM  Provider/Observer:  Ilean Skill, Psy.D.       Clinical Neuropsychologist       Billing Code/Service: [phone] 4 Units  Chief Complaint:    The patient reports that she has been recovering and doing better after her second subdural hematoma after a fall. The patient reports that she has been experiencing more anxiety and stress about some aspects of being in the hospital rehabilitation unit. She reports that she has lived in a small house and being in place with very large calls makes her anxious. The patient also has hearing loss that goes back to her childhood when she fell and injured her in her ear in some way at the same time breaking her norm. She reports that this was never addressed then and she is always had trouble with hearing. The patient denies any current hallucinations or psychosis but she does have a long history of psychosis and has been treated for many years for schizophrenia.  Reason for Service:   [name], a 76 year old female was referred for neuropsychological consultation due to issues with patient's adjustment and coping as well as a review of psychiatric issues to make sure they are being adequately address to ensure they would not complicate her treatment and rehabilitation.  This is the patient's second admission for SDH after falls.  First was 02/12/2017 and second 03/06/2017.  Below is the HPI from current admission.    HPI: [name] a 76 y.o.right handed femalewith history ofbilateral hip replacement,bilateral frontal burr hole for evacuation of subdural hematoma in April  16 by Dr.Poolafter recent fall. She was discharged to home 02/15/2017 with her daughter at minimal assist level using a walkerand home health therapies .Her daughter works during the day and her niece plans to assist as needed .Presented 03/06/2017 with multiple falls and reports of increasing lower extremity weakness. There has been no loss of consciousness. Cranial CT reviewed, showing left SDH. Per report, increased size of left extra-axial collection compatible with acute on chronic subdural hematoma. Increased left sided mass effect with now a 4 mm of left-to-right midline shift. Neurosurgery follow-up currently on Decadron protocol and conservative care. Occupational therapy evaluation completed with recommendations of physical medicine rehabilitation consult. Patient was admitted for comprehensive rehabilitation program  Current Status:  The patient reports that she has had more anxiety with admission.  She reports this is usual.  She denies any current hallucinations or psychosis.  She reports that she has continued to do well with her current psychotropic regimen.  She reports it helps her sleep at night and helps with anxiety during day.  She reports that Dr. Adele Schilder (psychiatrist) has really helped her.   Behavioral Observation: [name]  presents as a 76 y.o.-year-old Left African American Female who appeared her stated age. her dress was Appropriate and she was Well Groomed and her manners were Appropriate to the situation.  her participation was indicative of Appropriate, Attentive and but difficulty hearing (life long issue). behaviors.  There were physical disabilities noted related to  balance and gait.  she displayed an appropriate level of cooperation and motivation.     Interactions:    Active Appropriate and Redirectable  Attention:   abnormal and attention span appeared shorter than expected for age  Memory:   within normal limits; recent and remote memory  intact  Visuo-spatial:  within normal limits  Speech (Volume):  normal  Speech:   normal; fluent  Thought Process:  Tangential and Disorganized  Though Content:  Rumination; The patient denied any delusion or psychosis.  She reports that she has had hallucinations in the past.  Orientation:   person, place, time/date and situation  She did well on MMSE when adjusting from poor hearing.  Judgment:   Fair  Planning:   Poor  Affect:    Anxious  Mood:    Angry  Insight:   Fair  Education:   HS Graduate  Medical History:   Past Medical History:  Diagnosis Date  . Chronic venous insufficiency   . Colon, diverticulosis Sept. 2011   outpatient colonoscopy by Dr. Cristina Gong.  Need record  . DDD (degenerative disc disease), lumbosacral   . Depression   . DJD (degenerative joint disease), multiple sites    Low back pain worst  . GERD (gastroesophageal reflux disease)   . Gout   . Gout    Uric Acid level 4.2 on 300 allopurinol  . History of cervical cancer 1972  . Hx-TIA (transient ischemic attack) 05/13/2001   Right facial numbness  . Hyperlipidemia   . Hypertension   . Memory disorder 12/20/2016  . Mitral valve prolapse 10/27/1999   Subsequent 2D echo show normal mitral valve  . Osteoarthritis of hip    bilateral hips  . Ovarian cancer ([place]) 1972   S/P oophorectomy  . Psychosis   . Recurrent boils    History of MRSA skin infections with abscess  . Sensorineural hearing loss of both ears   . Subdural hematoma ([place]) 01/26/2017   bilateral       Abuse/Trauma History: The patient has talked about some events in childhood where she was injured and some treatment at her [employer] school but does not suggest those experiences cause any ongoing PTSD or engrained trauma.  However, more recent event has caused her a great deal of stress and emotional pain.  Patient's son committed suicide 4 years ago creating great stress for patient.  Psychiatric History:  The patient has a long  history of psychiatric illness.  She has been diagnosed with psychosis and anxiety but primary diagnosis currently is schizophrenia.  She is being followed by Dr. Adele Schilder with behavioral health in [place].  She states that his change in her meds from Haldol to Vistaril has been very helpful and she is sleeping better and not having as much anxiety during day.    Family Med/Psych History:  Family History  Problem Relation Age of Onset  . Diabetes Mother   . Heart disease Mother   . Hearing loss Mother   . Stroke Mother   . Post-traumatic stress disorder Father   . Schizophrenia Maternal Grandmother   . Diabetes Maternal Grandfather   . Other Brother        brother died in mental health hospital    Risk of Suicide/Violence: low The patient denies any SI or HI.  However, there is a family history of suicide.  Impression/DX:  The patient is a 76 year old female that has now had her second SDH following falls within the past  two months.  She does appear to be coping well espically considering her past and ongoing psychiatric history of schizophrenia.  She has been followed by Dr. Adele Schilder and she feels she is doing much better with these symptoms and feels her current medications are some of the most helpful she has ever taken.    Disposition/Plan:  Will remain available for further consult if psychiatric symptoms were to develop or worsen.    Diagnosis:    Subdural hemorrhage following injury without open intracranial wound and with loss of consciousness of 30 minutes or less, sequela ([place]) - Plan: Ambulatory referral to Physical Medicine Rehab         Electronically Signed   _______________________ Ilean Skill, Psy.D.

## 2017-03-14 NOTE — Progress Notes (Signed)
Physical Therapy Session Note  Patient Details  Name: JEWELINE REIF MRN: [id number] Date of Birth: 06/07/1941  Today's Date: 03/14/2017 PT Individual Time: 1443-1540 and 1333-1430 PT Individual Time Calculation (min): 56 min and 57 min  Short Term Goals: Week 1:  PT Short Term Goal 1 (Week 1): STG = LTG due to short ELOS.  Skilled Therapeutic Interventions/Progress Updates:  Treatment 1: Pt received in room & agreeable to tx. RN present administering meds. Pt denied c/o pain. Session focused on functional mobility - gait for endurance training, transfers, and stair negotiation. Pt ambulated room>gym>ortho gym> day room (1 rest break)>room with rollator and supervision. Pt requires cuing for pursed lip breathing 2/2 feeling SOB but SpO2 = 98%, HR = 69 bpm. Pt also requires occasional cuing for R attention and obstacle avoidance. Pt negotiated 8 steps (6") with B rails and supervision for LE strengthening. Pt completed functional transfers (low sedan simulated car and low couch in day room) with cuing for hand placement (pt attempted to pull on car door or either rollator for sit>stand) and supervision overall. At end of session pt left sitting in recliner in room with NT present.   Treatment 2: Pt received in room & agreeable to tx, denying c/o pain. Session focused on gait for endurance training, standing balance, activity tolerance, and BLE strengthening. Pt ambulated room>BI gym>gym>room with rollator and supervision. In BI gym pt engaged in dynavision standing without BUE support and steady assist/close supervision overall for standing balance. Pt able to participate in task x 1 minute + 2 minutes. Pt with about equal reaction time to all four quadrants with use of BUE to push lights. Pt utilized cybex kinetron in sitting, up to 40 cm/sec, for BLE strengthening. Pt engaged in pipe tree assembly (cross) while standing on compliant surface with UE support and supervision/steady assist for balance.  Pt required max cuing for problem solving & error correction when selecting pieces from choice of many. Pt easily overwhelmed with activity, reporting "it makes me frustrated as hell", with therapist providing instruction and encouragement. At end of session pt left sitting in recliner in room with all needs within reach.    Therapy Documentation Precautions:  Precautions Precautions: Fall Restrictions Weight Bearing Restrictions: No   See Function Navigator for Current Functional Status.   Therapy/Group: Individual Therapy  Waunita Schooner 03/14/2017, 3:10 PM

## 2017-03-14 NOTE — Progress Notes (Signed)
Speech Language Pathology Daily Session Note  Patient Details  Name: [name] MRN: [id number] Date of Birth: May 18, 1941  Today's Date: 03/14/2017 SLP Individual Time: 1133-1200 SLP Individual Time Calculation (min): 27 min  Short Term Goals: Week 1: SLP Short Term Goal 1 (Week 1): Pt will recall new, complex, daily information with min assist verbal cues to use external aids.   SLP Short Term Goal 2 (Week 1): Pt will complete semi-complex tasks wtih min assist verbal cues for functional problem solving.   SLP Short Term Goal 3 (Week 1): Pt will recognize and correct errors during semi-complex tasks wtih mina ssist verbal cues.    Skilled Therapeutic Interventions: Skilled treatment session focused on cognitive goals. Upon arrival, patient reported she was "flustered" due to not having a trash can near by with "too much stuff on her table." SLP addressed issue and patient was able to be redirected. Patient recalled events from previous therapy sessions with Min A question cues and asked appropriate questions in regards to anticipatory awareness and medication management.  All questions were addressed and patient will re-organize pill box to maximize comfort. Patient left upright in recliner with visitor present. Continue with current plan of care.      Function:  Cognition Comprehension Comprehension assist level: Follows complex conversation/direction with no assist  Expression   Expression assist level: Expresses basic needs/ideas: With no assist  Social Interaction Social Interaction assist level: Interacts appropriately with others - No medications needed.  Problem Solving Problem solving assist level: Solves basic problems with no assist  Memory Memory assist level: Recognizes or recalls 75 - 89% of the time/requires cueing 10 - 24% of the time    Pain No/Denies Pain   Therapy/Group: Individual Therapy  [name] 03/14/2017, 3:08 PM

## 2017-03-14 NOTE — Progress Notes (Addendum)
Physical Therapy Note  Patient Details  Name: [name] MRN: [id number] Date of Birth: July 01, 1941 Today's Date: 03/14/2017  1500-1600, 60 min individual tx Pain: no c/o  Sit> stand from recliner to Rollator with supervision, remembering.brakes without cues.  Gait in room to toilet and sink for continenet B and B.  Pt DOE after toileting; she sat for rest break on seat of Rollator safely. Pt appears to hold breath during movement; PT educated her on importance of not holding breath  And instructed in diaphragmatic breathing.  Gait on level tile with rollator RW x 10' R/L laterally with supervision.  Up/down 12 steps 2 rails, self selecting step through method without difficulty, min guard assist/supervsion.  Seated restbreak required after steps.  Seated ex: 20 x 1 bil hip adduction with internal rotation, bil heel raises, alternating toe raises.  IN standing with Rollator, pt retrieved pen from floor using L hand, with extra time, min guard assist.Gait to return to room using Rollator.  Pt left resting in recliner witj all needs within reach.  See function navigator for current status.  `  Laquasia Pincus 03/14/2017, 12:32 PM

## 2017-03-15 ENCOUNTER — Inpatient Hospital Stay (HOSPITAL_COMMUNITY): Payer: PPO

## 2017-03-15 ENCOUNTER — Inpatient Hospital Stay (HOSPITAL_COMMUNITY): Payer: Self-pay | Admitting: Speech Pathology

## 2017-03-15 ENCOUNTER — Inpatient Hospital Stay (HOSPITAL_COMMUNITY): Payer: PPO | Admitting: Occupational Therapy

## 2017-03-15 ENCOUNTER — Inpatient Hospital Stay (HOSPITAL_COMMUNITY): Payer: Self-pay | Admitting: Physical Therapy

## 2017-03-15 MED ORDER — DEXAMETHASONE 2 MG PO TABS
1.0000 mg | ORAL_TABLET | Freq: Three times a day (TID) | ORAL | Status: DC
  Administered 2017-03-15 – 2017-03-16 (×3): 1 mg via ORAL
  Filled 2017-03-15 (×3): qty 1

## 2017-03-15 NOTE — Progress Notes (Signed)
Physical Therapy Session Note  Patient Details  Name: SUMIE REMSEN MRN: [id number] Date of Birth: 24-May-1941  Today's Date: 03/15/2017 PT Individual Time: 5809-9833 PT time: 40 min      Short Term Goals: Week 1:  PT Short Term Goal 1 (Week 1): STG = LTG due to short ELOS.  Skilled Therapeutic Interventions/Progress Updates:   Pt received sitting in recliner and agreeable to PT.   Pt instructed in sit<>stand and ambulatory transfers throughout treatment with supervision assist and use of rollator   Toileting completed at start of treatment with distant supervision assist BUE support on Rollator for toilet transfer.   Gait training through rehab unit with supervision assist and rollator. Min cues for posture and step length throughout.    Dynamic standing balance to play 2 rounds of horse shoes and pick up horse shoes with supervision assist. Pt required 1 UE support on RW while picking up horse shoes. Supervision assist while throwing horse shoes for safety during rotational pertubation.   Patient returned too room and left sitting in recliner with call bell in reach and all needs met.          Therapy Documentation Precautions:  Precautions Precautions: Fall Restrictions Weight Bearing Restrictions: No Vital Signs: Therapy Vitals Temp: 98.8 F (37.1 C) Temp Source: Oral Pulse Rate: (!) 59 (rn notified) Resp: (!) 24 (rn notified) BP: (!) 119/54 (rn notified) Patient Position (if appropriate): Sitting Oxygen Therapy SpO2: 97 % O2 Device: Not Delivered Pain: Pain Assessment Pain Assessment: No/denies pain   See Function Navigator for Current Functional Status.   Therapy/Group: Individual Therapy  Lorie Phenix 03/15/2017, 5:05 PM

## 2017-03-15 NOTE — Progress Notes (Signed)
Occupational Therapy Session Note  Patient Details  Name: [name] MRN: [id number] Date of Birth: 01-Dec-1940  Today's Date: 03/15/2017 OT Individual Time: 0930-1040 OT Individual Time Calculation (min): 70 min    Short Term Goals: Week 1:  OT Short Term Goal 1 (Week 1): Pt will complete stand pivot transfer to toilet with min A OT Short Term Goal 2 (Week 1): Pt will stand to complete grooming task with steadying assist to increase functional activity tolerance OT Short Term Goal 3 (Week 1): Pt will complete clothing management during toileting task with no more than mod steadying assist OT Short Term Goal 4 (Week 1): Pt will dress UB with set-up assist  Skilled Therapeutic Interventions/Progress Updates: Patient completed therapy today as follows:   Toilet transfer via rollator=close S; toileting= supervision on 3:1   (patient may benefit from utilizing regular toilet seat for a great sit to stand challenge);   Shower transfer via rollator and tub transfer bench and balancing slightly on wall = close S due to wet surface      UB bathing and dressing = supervision (after obtaining her clothing that dtr had set up at opposite side of room  - via rollator)   LB bathing= Min A (was able to wet middle of towel to throw down to  wash feet and utilized sock aide and long handled shoe horn to don shoes - she had difficulty getting heel of shoe in place)   Grooming=setup  Completed winded with dynamic standing tasks but was able to gain breath and continue completing her tasks with supervision.  Patient was assisted back to her recliner and with call bell in place at the end of the session     Therapy Documentation Precautions:  Precautions Precautions: Fall Restrictions Weight Bearing Restrictions: No      Pain:"my head hurts across my eyes" but she did not rate    Therapy/Group: Individual Therapy  [name] [facility] 03/15/2017, 12:39 PM

## 2017-03-15 NOTE — Progress Notes (Addendum)
Physical Therapy Note  Patient Details  Name: [name] MRN: [id number] Date of Birth: Nov 18, 1940 Today's Date: 03/15/2017  0900-0930, 30 min individual tx Pain: "bad HA"; unable to rate ; PT informed [name], RN  Pt seated in recliner, quite agitated because she doesn't have a newspaper, and is waiting for pain meds.  PT found a newspaper for her on MW unit, and pt calmed down during session.   Pt participated in [place] in standing with bil UE support on locked Rollator walker: 10 x 1  Heel/toe raises, R/L modified tandem mini squats, R/L hip abduction, R/L hamstring curls.  VCs throughout for pt to count aloud to regulate breathing. Also, seated R/L ankle pumps with extended knees.   Pt passed off to entering OT.  See function navigator for current status.   [name] 03/15/2017, 7:50 AM

## 2017-03-15 NOTE — Progress Notes (Signed)
Speech Language Pathology Daily Session Note  Patient Details  Name: [name] MRN: [id number] Date of Birth: 02-11-1941  Today's Date: 03/15/2017 SLP Individual Time: 9030-0923 SLP Individual Time Calculation (min): 58 min  Short Term Goals: Week 1: SLP Short Term Goal 1 (Week 1): Pt will recall new, complex, daily information with min assist verbal cues to use external aids.   SLP Short Term Goal 2 (Week 1): Pt will complete semi-complex tasks wtih min assist verbal cues for functional problem solving.   SLP Short Term Goal 3 (Week 1): Pt will recognize and correct errors during semi-complex tasks wtih mina ssist verbal cues.    Skilled Therapeutic Interventions:  Pt was seen for skilled ST targeting cognitive goals.  SLP facilitated the session with a medication management task targeting goals for memory and problem solving.  Pt required min assist verbal cues for organization to load pills into a twice daily pill box for 100% accuracy.   When ambulating back to room, pt needed min assist verbal cues for use of external aids to facilitate wayfinding back to room.  Pt was left in recliner with call bell within reach.  Continue per current plan of care.       Function:  Eating Eating                 Cognition Comprehension Comprehension assist level: Follows basic conversation/direction with no assist  Expression   Expression assist level: Expresses basic needs/ideas: With no assist  Social Interaction Social Interaction assist level: Interacts appropriately 75 - 89% of the time - Needs redirection for appropriate language or to initiate interaction.  Problem Solving Problem solving assist level: Solves basic 75 - 89% of the time/requires cueing 10 - 24% of the time  Memory Memory assist level: Recognizes or recalls 75 - 89% of the time/requires cueing 10 - 24% of the time    Pain Pain Assessment Pain Assessment: No/denies pain Pain Score: 0-No pain  Therapy/Group:  Individual Therapy  Estera Ozier, Selinda Orion 03/15/2017, 1:58 PM

## 2017-03-15 NOTE — Progress Notes (Signed)
Occupational Therapy Session Note  Patient Details  Name: [name] MRN: [id number] Date of Birth: 1940-11-23  Today's Date: 03/15/2017 OT Concurrent Time: 1400-1500 OT Concurrent Time Calculation (min): 60 min   Short Term Goals: Week 1:  OT Short Term Goal 1 (Week 1): Pt will complete stand pivot transfer to toilet with min A OT Short Term Goal 2 (Week 1): Pt will stand to complete grooming task with steadying assist to increase functional activity tolerance OT Short Term Goal 3 (Week 1): Pt will complete clothing management during toileting task with no more than mod steadying assist OT Short Term Goal 4 (Week 1): Pt will dress UB with set-up assist  Skilled Therapeutic Interventions/Progress Updates:    Upon entering the room, pt seated in recliner chair awaiting therapist. Pt ambulated to bathroom for toileting with rollator and overall supervision. Pt ambulating to ADL apartment 150' + with rollator and supervision. Pt taking seated rest break once reaching room secondary to fatigue. Pt demonstrating kitchen mobility tasks with use of rollator to obtain items from refrigerator and transferring them from one side of kitchen to the next with use of rollator for safety. Pt emptied dish washer and placed items back into cabinets with min cues for rollator placement for safety. OT provided LH shoe horn and demonstrated use to increase independence with donning shoes. Pt attempting to return demonstration but continues to need assistance with task. Pt returning back to room ambulating in same manner as above.   Therapy Documentation Precautions:  Precautions Precautions: Fall Restrictions Weight Bearing Restrictions: No General:   Vital Signs: Therapy Vitals Temp: 98.8 F (37.1 C) Temp Source: Oral Pulse Rate: (!) 59 Resp: (!) 24 BP: (!) 119/54 Patient Position (if appropriate): Sitting Oxygen Therapy SpO2: 97 % O2 Device: Not Delivered Pain: Pain Assessment Pain  Assessment: No/denies pain  See Function Navigator for Current Functional Status.   Therapy/Group: Individual Therapy  Gypsy Decant 03/15/2017, 4:41 PM

## 2017-03-15 NOTE — Progress Notes (Signed)
Social Work Patient ID: [name], female   DOB: 1941/06/22, 76 y.o.   MRN: [id number]   Met with pt and daughter this morning to review team conference and confirm d/c plan/ location.  Both aware and agreeable with targeted d/c on Sun 5/20 and have scheduled family ed to take place on 5/19 from 9-12.  Daughter reports that she plans to have pt d/c to daughter's home to allow more ease with providing recommended 24/7 supervision.  Pt is agreeable with this plan.  Daughter also requests OP tx f/u and have made referrals to [facility].  Daughter without any further concerns.  Continue to follow.  Regina Ganci, LCSW

## 2017-03-15 NOTE — Patient Care Conference (Signed)
Inpatient RehabilitationTeam Conference and Plan of Care Update Date: 03/13/2017   Time: 2:40 PM    Patient Name: [name]      Medical Record Number: [id number]  Date of Birth: 06-14-1941 Sex: Female         Room/Bed: 4W17C/4W17C-01 Payor Info: Payor: [employer] / Plan: [employer] / Product Type: *No Product type* /    Admitting Diagnosis: Acute Chronic Transtic SDH After FAll  Admit Date/Time:  03/08/2017  6:13 PM Admission Comments: No comment available   Primary Diagnosis:  Subdural hemorrhage following injury without open intracranial wound and with loss of consciousness of 30 minutes or less, sequela ([place]) Principal Problem: Subdural hemorrhage following injury without open intracranial wound and with loss of consciousness of 30 minutes or less, sequela [facility])  Patient Active Problem List   Diagnosis Date Noted  . Subdural hemorrhage following injury without open intracranial wound and with loss of consciousness of 30 minutes or less, sequela ([place]) 03/08/2017  . Fall   . TBI (traumatic brain injury) ([place])   . History of bilateral hip replacements   . Steroid-induced hyperglycemia   . Acute blood loss anemia   . Elevated blood pressure reading   . Stage 3 chronic kidney disease   . Subdural hematoma ([place]) 03/06/2017  . SDH (subdural hematoma) ([place]) 02/10/2017  . Subdural hematoma without coma ([place]) 02/09/2017  . Acute low back pain 02/04/2017  . Memory disorder 12/20/2016  . History of lumbar laminectomy for spinal cord decompression 02/02/2016  . Atopic dermatitis 07/09/2015  . Bereavement 12/01/2014  . Psychosis 12/01/2014  . Reactive airway disease 08/11/2014  . History of cancer 08/11/2014  . OSA (obstructive sleep apnea) 08/11/2014  . Healthcare maintenance 07/17/2013  . Chronic kidney disease (CKD), stage III (moderate) 10/04/2012  . Schizophrenia, chronic condition ([place]) 01/20/2012  . Postmenopausal atrophic vaginitis 11/01/2010  .  Chronic diastolic heart failure ([place]) 05/06/2010  . Allergic rhinitis 10/14/2009  . Obesity, Class II, BMI 35-39.9, with comorbidity 05/14/2009  . Chronic prescription opiate use 04/29/2008  . Normocytic anemia 09/25/2007  . Hyperlipidemia 09/03/2006  . Gout 09/03/2006  . Essential hypertension 09/03/2006  . GERD 09/03/2006  . Hx-TIA (transient ischemic attack) 05/13/2001    Expected Discharge Date: Expected Discharge Date: 03/18/17  Team Members Present: Physician leading conference: Dr. Alger Simons Social Worker Present: Lennart Pall, LCSW Nurse Present: Heather Roberts, RN PT Present: Jorge Mandril, PT;Victoria Sabra Heck, PT OT Present: Benay Pillow, OT SLP Present: Weston Anna, SLP PPS Coordinator present : Daiva Nakayama, RN, CRRN     Current Status/Progress Goal Weekly Team Focus  Medical   acute on chronic SDH, conservative mgt, steroid taper  improve functional mobility and safety awarenes  steroid tapering, rx uti, temperature regulation   Bowel/Bladder   continent of b/b, LBM 5/14  remain continent of b/b with min assist  monitor b/b status q shift and prn   Swallow/Nutrition/ Hydration             ADL's   supervision - steady assist with all self care tasks. Steady assistance for toilet and shower transfer  Supervision overall   self care retraining, balance, endurance, B UE strengthening, cognition, pt/family edu   Mobility   supervision with rollator, impaired balance  supervision overall  balance, endurance, gait, pt/family education, cognitive remediation   Communication             Safety/Cognition/ Behavioral Observations  Min A  Supervision  complex problem solving, recall, awareness  Pain   no c/o pain  pain </= 2  monitor q shift and prn   Skin   no s/s of breakdown, pt refuses to remove upper denture for nighttime cleaning  no skin breakdown while on IPR  monitor skin/mucous membranes q shift and prn    Rehab Goals Patient on target to meet rehab  goals: Yes *See Care Plan and progress notes for long and short-term goals.  Barriers to Discharge: HOH, a little impulsive    Possible Resolutions to Barriers:  continued education re: safety, adaptive equipment training,     Discharge Planning/Teaching Needs:  Plan to d/c home with daughter and other family/ friends providing 24/7 supervision  Education to be scheduled.   Team Discussion:  Some behavior issues but likely premorbid;  Easily upset by various, minor issues.  Steroids could also be affecting this.  Reaching supervision goals with PT.  ST focus on more functional tasks.  Need to schedule family ed for Saturday.  Revisions to Treatment Plan:  None   Continued Need for Acute Rehabilitation Level of Care: The patient requires daily medical management by a physician with specialized training in physical medicine and rehabilitation for the following conditions: Daily direction of a multidisciplinary physical rehabilitation program to ensure safe treatment while eliciting the highest outcome that is of practical value to the patient.: Yes Daily medical management of patient stability for increased activity during participation in an intensive rehabilitation regime.: Yes Daily analysis of laboratory values and/or radiology reports with any subsequent need for medication adjustment of medical intervention for : Neurological problems;Mood/behavior problems  Ahkeem Goede 03/15/2017, 2:32 PM

## 2017-03-15 NOTE — Discharge Instructions (Signed)
Inpatient Rehab Discharge Instructions  ANASOFIA MICALLEF Discharge date and time: No discharge date for patient encounter.   Activities/Precautions/ Functional Status: Activity: activity as tolerated Diet: regular diet Wound Care: none needed Functional status:  ___ No restrictions     ___ Walk up steps independently ___ 24/7 supervision/assistance   ___ Walk up steps with assistance ___ Intermittent supervision/assistance  ___ Bathe/dress independently ___ Walk with walker     _x__ Bathe/dress with assistance ___ Walk Independently    ___ Shower independently ___ Walk with assistance    ___ Shower with assistance ___ No alcohol     ___ Return to work/school ________     COMMUNITY REFERRALS UPON DISCHARGE:    Outpatient: PT     OT    ST                   Agency:  [facility]      Phone: [phone]               Appointment Date/Time: (Monday)  03/19/17 @ 11:45 (please arrive at 11:15 for paperwork) - physical therapy                                                                                                                                                                 03/21/17 @ 9:30a - 11:00  (speech and occupational therapy)  Medical Equipment/Items Ordered: tub bench                                                    Agency/Supplier:  [facility] @ [phone]      Special Instructions: No driving   My questions have been answered and I understand these instructions. I will adhere to these goals and the provided educational materials after my discharge from the hospital.  Patient/Caregiver Signature _______________________________ Date __________  Clinician Signature _______________________________________ Date __________  Please bring this form and your medication list with you to all your follow-up doctor's appointments.

## 2017-03-15 NOTE — Progress Notes (Signed)
[place] PHYSICAL MEDICINE & REHABILITATION     PROGRESS NOTE    Subjective/Complaints: Was "hot" last night. Overall feeling well. Good appetite  ROS: pt denies nausea, vomiting, diarrhea, cough, shortness of breath or chest pain  Objective: Vital Signs: Blood pressure 138/68, pulse 68, temperature 98 F (36.7 C), temperature source Oral, resp. rate 16, height 5\' 3"  (1.6 m), weight 92.6 kg (204 lb 3.8 oz), SpO2 100 %. No results found. No results for input(s): WBC, HGB, HCT, PLT in the last 72 hours.  Recent Labs  03/13/17 0735  NA 138  K 4.5  CL 109  GLUCOSE 111*  BUN 28*  CREATININE 1.26*  CALCIUM 8.7*   CBG (last 3)  No results for input(s): GLUCAP in the last 72 hours.  Wt Readings from Last 3 Encounters:  03/14/17 92.6 kg (204 lb 3.8 oz)  03/06/17 90.2 kg (198 lb 13.7 oz)  02/09/17 86.5 kg (190 lb 12.8 oz)    Physical Exam:  Constitutional: She appears well-developed.  HENT:  Head: Normocephalic.  Eyes: EOMI. No swelling. anicteric Neck: Normal range of motion. Neck supple. No tracheal deviationpresent. No thyromegalypresent.  Cardiovascular:  RRR Respiratory: CTA B YW:VPXT NT/ND, BS+  Skin. Warm and dry Neurological: She is alert and oriented x 3. HOH Reasonable insight and awareness. Remains a little disinhibited and distracted. Decreased attention---some of this baseline??? Motor: B/l UE 5/5 proximal to distal B/l LE: 4/5 HF, 4/5 Add, 4/5 Hip Abd, KE, 5/5 ADF/PF--unchanged Psych: pleasant and cooperative  Assessment/Plan: 1. Functional and cognitive deficits secondary to SDH which require 3+ hours per day of interdisciplinary therapy in a comprehensive inpatient rehab setting. Physiatrist is providing close team supervision and 24 hour management of active medical problems listed below. Physiatrist and rehab team continue to assess barriers to discharge/monitor patient progress toward functional and medical goals.  Function:  Bathing Bathing  position   Position: Shower  Bathing parts Body parts bathed by patient: Right arm, Left arm, Chest, Abdomen, Front perineal area, Buttocks, Right upper leg, Left lower leg, Right lower leg, Left upper leg Body parts bathed by helper: Back  Bathing assist Assist Level: Touching or steadying assistance(Pt > 75%)      Upper Body Dressing/Undressing Upper body dressing   What is the patient wearing?: Pull over shirt/dress, Bra Bra - Perfomed by patient: Thread/unthread right bra strap, Thread/unthread left bra strap, Hook/unhook bra (pull down sports bra) Bra - Perfomed by helper: Hook/unhook bra (pull down sports bra) Pull over shirt/dress - Perfomed by patient: Thread/unthread right sleeve, Thread/unthread left sleeve, Put head through opening, Pull shirt over trunk Pull over shirt/dress - Perfomed by helper: Pull shirt over trunk        Upper body assist Assist Level: Supervision or verbal cues      Lower Body Dressing/Undressing Lower body dressing   What is the patient wearing?: Underwear, Pants, Shoes, Socks Underwear - Performed by patient: Thread/unthread right underwear leg, Thread/unthread left underwear leg, Pull underwear up/down Underwear - Performed by helper: Pull underwear up/down Pants- Performed by patient: Thread/unthread right pants leg, Thread/unthread left pants leg, Pull pants up/down Pants- Performed by helper: Pull pants up/down     Socks - Performed by patient: Don/doff right sock, Don/doff left sock     Shoes - Performed by helper: Don/doff right shoe, Don/doff left shoe          Lower body assist Assist for lower body dressing: Touching or steadying assistance (Pt > 75%)  Toileting Toileting   Toileting steps completed by patient: Performs perineal hygiene, Adjust clothing prior to toileting, Adjust clothing after toileting Toileting steps completed by helper: Adjust clothing prior to toileting, Performs perineal hygiene, Adjust clothing after  toileting Toileting Assistive Devices: Grab bar or rail, Other (comment) (rollator)  Toileting assist Assist level: Supervision or verbal cues   Transfers Chair/bed transfer   Chair/bed transfer method: Ambulatory Chair/bed transfer assist level: Supervision or verbal cues Chair/bed transfer assistive device: Armrests, Medical sales representative     Max distance: 150 Assist level: Supervision or verbal cues   Wheelchair Wheelchair activity did not occur: Safety/medical concerns        Cognition Comprehension Comprehension assist level: Follows complex conversation/direction with no assist  Expression Expression assist level: Expresses basic needs/ideas: With no assist  Social Interaction Social Interaction assist level: Interacts appropriately with others - No medications needed.  Problem Solving Problem solving assist level: Solves basic problems with no assist  Memory Memory assist level: Recognizes or recalls 75 - 89% of the time/requires cueing 10 - 24% of the time   Medical Problem List and Plan: 1. Decreased functional mobilitysecondary to acute on chronic left traumatic SDH status post bilateral frontal burr hole 02/12/2017  -continue CIR therapies  -weaning steroids to off.  2. DVT Prophylaxis/Anticoagulation: SCDs. Patient is ambulatory 3. Pain Managementwith history of bilateral total hip replacement and chronic back pain: Ultram as needed  -added kpad for back/hips 4. Mood: Wellbutrin 300 mg daily, hydroxyzine 25 mg daily at bedtime 5. Neuropsych: This patient iscapable of making decisions on herown behalf. 6. Skin/Wound Care: Routine skin checks 7. Fluids/Electrolytes/Nutrition: I personally reviewed the patient's labs today.   -BUN remains elevated from baseline. Continue to hold lasix (20mg  daily)  -recheck tomorrow 8.Hypertension. Norvasc 10 mg daily, Lasix 20 mg daily (held), Cozaar 100 mg daily. Monitor with increased mobility 9.Gout.  Allopurinol. Monitor for any gout flareups 10. Overactive bladder. Ditropan 5 mg daily.   11.Hyperlipidemia. Lipitor 12.GERD. Protonix 13.Constipation. Laxative assistance 14. ABLA: hgb 10.2 today, no signs of blood loss 15. Leukocytosis: 11.4 today. I suspect this is steroid related  -UCX + for Ecoli 100k---sensitive to keflex --day 3 of 7 16. Insomnia/sweating: improving  -weaning steroids    -keep room warm  -prn trazodone   LOS (Days) 7 A FACE TO FACE EVALUATION WAS PERFORMED  Meredith Staggers, MD 03/15/2017 9:39 AM

## 2017-03-16 ENCOUNTER — Inpatient Hospital Stay (HOSPITAL_COMMUNITY): Payer: Self-pay | Admitting: Speech Pathology

## 2017-03-16 ENCOUNTER — Inpatient Hospital Stay (HOSPITAL_COMMUNITY): Payer: PPO | Admitting: Occupational Therapy

## 2017-03-16 ENCOUNTER — Inpatient Hospital Stay (HOSPITAL_COMMUNITY): Payer: Self-pay | Admitting: Physical Therapy

## 2017-03-16 ENCOUNTER — Inpatient Hospital Stay (HOSPITAL_COMMUNITY): Payer: PPO | Admitting: Speech Pathology

## 2017-03-16 LAB — BASIC METABOLIC PANEL
Anion gap: 7 (ref 5–15)
BUN: 31 mg/dL — ABNORMAL HIGH (ref 6–20)
CO2: 23 mmol/L (ref 22–32)
Calcium: 8.6 mg/dL — ABNORMAL LOW (ref 8.9–10.3)
Chloride: 107 mmol/L (ref 101–111)
Creatinine, Ser: 1.33 mg/dL — ABNORMAL HIGH (ref 0.44–1.00)
GFR calc Af Amer: 44 mL/min — ABNORMAL LOW (ref >60)
GFR calc non Af Amer: 38 mL/min — ABNORMAL LOW (ref >60)
Glucose, Bld: 112 mg/dL — ABNORMAL HIGH (ref 65–99)
Potassium: 5.1 mmol/L (ref 3.5–5.1)
Sodium: 137 mmol/L (ref 135–145)

## 2017-03-16 MED ORDER — AMLODIPINE BESYLATE 10 MG PO TABS: 10.0000 mg | ORAL_TABLET | Freq: Every day | ORAL | 3 refills | Status: DC

## 2017-03-16 MED ORDER — POTASSIUM CHLORIDE ER 10 MEQ PO CPCR: 10.0000 meq | ORAL_CAPSULE | Freq: Every day | ORAL | 3 refills | Status: DC

## 2017-03-16 MED ORDER — LOSARTAN POTASSIUM 100 MG PO TABS: 100.0000 mg | ORAL_TABLET | Freq: Every day | ORAL | 11 refills | Status: DC

## 2017-03-16 MED ORDER — DEXAMETHASONE 2 MG PO TABS
1.0000 mg | ORAL_TABLET | Freq: Two times a day (BID) | ORAL | Status: AC
  Administered 2017-03-16 – 2017-03-18 (×4): 1 mg via ORAL
  Filled 2017-03-16 (×4): qty 1

## 2017-03-16 MED ORDER — ALLOPURINOL 300 MG PO TABS: 300.0000 mg | ORAL_TABLET | Freq: Every day | ORAL | 3 refills | Status: DC

## 2017-03-16 MED ORDER — OXYBUTYNIN CHLORIDE ER 5 MG PO TB24: 5.0000 mg | ORAL_TABLET | Freq: Every day | ORAL | 3 refills | Status: DC

## 2017-03-16 MED ORDER — HYDROXYZINE HCL 25 MG PO TABS: 25.0000 mg | ORAL_TABLET | Freq: Every day | ORAL | 0 refills | Status: DC

## 2017-03-16 MED ORDER — ATORVASTATIN CALCIUM 40 MG PO TABS: 40.0000 mg | ORAL_TABLET | Freq: Every day | ORAL | 3 refills | Status: DC

## 2017-03-16 MED ORDER — TRAMADOL HCL 50 MG PO TABS: 50.0000 mg | ORAL_TABLET | Freq: Two times a day (BID) | ORAL | 0 refills | Status: DC | PRN

## 2017-03-16 MED ORDER — PANTOPRAZOLE SODIUM 20 MG PO TBEC: 20.0000 mg | DELAYED_RELEASE_TABLET | Freq: Two times a day (BID) | ORAL | 0 refills | Status: DC

## 2017-03-16 MED ORDER — BUPROPION HCL ER (XL) 300 MG PO TB24: 300.0000 mg | ORAL_TABLET | Freq: Every morning | ORAL | 2 refills | Status: DC

## 2017-03-16 MED ORDER — CEPHALEXIN 250 MG PO CAPS: 250.0000 mg | ORAL_CAPSULE | Freq: Four times a day (QID) | ORAL | 0 refills | Status: DC

## 2017-03-16 MED ORDER — DEXAMETHASONE 1 MG PO TABS: 1.0000 mg | ORAL_TABLET | Freq: Two times a day (BID) | ORAL | 0 refills | Status: DC

## 2017-03-16 MED ORDER — ALPRAZOLAM 0.25 MG PO TABS: 0.2500 mg | ORAL_TABLET | Freq: Three times a day (TID) | ORAL | 0 refills | Status: DC | PRN

## 2017-03-16 NOTE — Progress Notes (Signed)
Occupational Therapy Session Note  Patient Details  Name: [name] MRN: [id number] Date of Birth: 03/22/1941  Today's Date: 03/16/2017 OT Individual Time: 1031-5945 OT Individual Time Calculation (min): 58 min    Short Term Goals: Week 1:  OT Short Term Goal 1 (Week 1): Pt will complete stand pivot transfer to toilet with min A OT Short Term Goal 2 (Week 1): Pt will stand to complete grooming task with steadying assist to increase functional activity tolerance OT Short Term Goal 3 (Week 1): Pt will complete clothing management during toileting task with no more than mod steadying assist OT Short Term Goal 4 (Week 1): Pt will dress UB with set-up assist  Skilled Therapeutic Interventions/Progress Updates:    Upon entering the room, pt seated in recliner chair eating breakfast with no c/o pain. Pt agreeable to OT intervention and requesting shower this session. Her daughter also arriving to room and observed part of session. OT reviewed goals with daughter for family education. Pt obtained clothing items and ambulated to bathroom with supervision with use of rollator. Pt bathing self at shower level while seated on TTB with overall supervision for safety. Pt ambulating to sit on EOB to don clothing items with min verbal cues to lock rollator for safety and steady assistance when bending over to don B shoes and socks. Pt returning to recliner chair at end of session with call bell and all needed items within reach.   Therapy Documentation Precautions:  Precautions Precautions: Fall Restrictions Weight Bearing Restrictions: No General:   Vital Signs: Therapy Vitals Temp: 98.4 F (36.9 C) Temp Source: Oral Pulse Rate: (!) 51 Resp: 20 BP: 131/61 Patient Position (if appropriate): Lying Oxygen Therapy SpO2: 97 % O2 Device: Not Delivered  See Function Navigator for Current Functional Status.   Therapy/Group: Individual Therapy  Gypsy Decant 03/16/2017, 8:01 AM

## 2017-03-16 NOTE — Discharge Summary (Signed)
Discharge summary job [id number]

## 2017-03-16 NOTE — Discharge Summary (Signed)
[name], [name]:  [id number]  MEDICAL RECORD NO.:  [id number]  LOCATION:  4W17C                        FACILITY:  [facility]  PHYSICIAN:  [name], M.D.DATE OF BIRTH:  [birth year]  DATE OF ADMISSION:  03/08/2017 DATE OF DISCHARGE:  03/18/2017                              DISCHARGE SUMMARY   DISCHARGE DIAGNOSES: 1. Left traumatic subdural hematoma, status post bilateral frontal     burr hole placement February 12, 2017. 2. Sequential compression devices for deep venous thrombosis     prophylaxis. 3. Pain management. 4. Depression. 5. Hypertension. 6. Gout. 7. Overactive bladder. 8. Hyperlipidemia. 9. Gastroesophageal reflux disease. 10.Constipation. 11.Acute blood loss anemia. 12.Leukocytosis, resolving. 13.Insomnia. 14.AKI  HISTORY OF PRESENT ILLNESS:  This is a 76 year old right-handed female, with history of bilateral hip replacement, bilateral frontal burr hole for evacuation of subdural hematoma in April by Dr. Annette Drake after recent fall, who was discharged to home with her daughter, minimal assistance, with persistent weakness.  Presented Mar 06, 2017, with multiple falls, report of increasing lower extremity weakness, no loss of consciousness. Cranial CT scan showed left SDH.  Per report, increased size of left extra-axial collection compatible with acute on chronic subdural hematoma with some increased left-sided mass effect, 4 mm left to right midline shift.  Neurosurgery consulted, advised Decadron protocol, conservative care.  Physical and occupational therapy ongoing.  The patient was admitted for a comprehensive rehab program.  PAST MEDICAL HISTORY:  See discharge diagnoses.  SOCIAL HISTORY:  Lives with family, used assistive device prior to admission.  FUNCTIONAL STATUS UPON ADMISSION TO REHAB SERVICES:  Minimal assist, 8- feet rolling walker; minimal assist, stand pivot transfers; mod max assist, activities of daily  living.  PHYSICAL EXAMINATION:  VITAL SIGNS:  Blood pressure 115/76, pulse 70, and temperature 98. GENERAL:  This was an alert female, made good eye contact with examiner and followed basic commands, provides her name, age, date of birth. Oriented to hospital and date. EYES:  EOMs intact. NECK:  Supple, nontender.  No JVD. CARDIAC:  Regular rate and rhythm.  No murmur. ABDOMEN:  Soft, nontender.  Good bowel sounds. LUNGS:  Clear to auscultation without wheeze.  REHABILITATION HOSPITAL COURSE:  The patient was admitted to inpatient rehab services with therapies initiated on a 3-hour daily basis, consisting of physical therapy, occupational therapy, speech therapy, and rehabilitation nursing.  The following issues were addressed during the patient's rehabilitation stay.  Pertaining to [name]'s chronic left traumatic SDH, she had undergone recent burr hole February 12, 2017, per Neurosurgery, Dr. Annette Drake, conservative care, completed course of Decadron therapy as advised, she would follow up Outpatient Neurosurgery.  Pain management with Ultram as needed.  Noted history of bilateral hip replacements and chronic back pain.  She did have a history of depression.  She continued on Wellbutrin as well as hydroxyzine at bedtime.  She was participating fully with her therapies. Blood pressures overall controlled with present regimen.  She did have a history of some overactive bladder, maintained on Ditropan.  Acute on chronic anemia 10.2, asymptomatic.  Mild leukocytosis, felt to be driven by steroids, although she did have an E. coli UTI sensitive to Keflex, she remained  afebrile.  Bouts of insomnia improved as she regulated her day and night schedule.AKI with creatinine 1.33 and Lasix held.  The patient received weekly collaborative interdisciplinary team conferences to discuss estimated length of stay, family teaching, any barriers to discharge.  Modified tandem mini squats as well as  energy conservation techniques.  Ambulates on level tile with rolling walker, supervision up and down stairs.  With minimal assist supervision needing some seated rest breaks.  She could gather her belongings for activities of daily living and homemaking using an assistive device.  Ambulating to the ADL apartment.  Demonstrated kitchen mobility tasks.  She could empty the dishwasher and place items back into the cabinets with minimal cues.  She was able to communicate fully with her needs, working with task targeting goals, memory, and any problem solving.  Full family teaching was completed and plan discharge to home.  DISCHARGE MEDICATIONS: 1. Allopurinol 300 mg p.o. daily. 2. Norvasc 10 mg p.o. daily. 3. Lipitor 40 mg p.o. daily. 4. Wellbutrin 300 mg p.o. daily. 5. Keflex 250 mg p.o. q.i.d. x2 more days and stop. 6. Decadron 1mg  twice daily X 2 days 7. Colace 100 mg p.o. b.i.d. 8. Atarax 25 mg p.o. at bedtime. 9. Cozaar 100 mg p.o. daily. 10.Ditropan 5 mg p.o. at bedtime. 11.Protonix 20 mg p.o. b.i.d. 12.Potassium chloride 10 mEq p.o. daily. 13.Xanax 0.25 mg p.o. t.i.d. as needed. 14.Ultram 50 mg p.o. b.i.d. as needed.  DIET:  Regular.  FOLLOWUP:  She would follow up with Dr. Alger Drake at the outpatient rehab service office as directed; Dr. Earnie Drake, Neurosurgery call for appointment; Dr. Larey Drake, medical management.  Special instructions. Continue to hold Lasix   [name], P.A.   ______________________________ [name], M.D.    DA/MEDQ  D:  03/16/2017  T:  03/16/2017  Job:  [id number]  cc:   [name], M.D. [name], M.D. [name], M.D.

## 2017-03-16 NOTE — Progress Notes (Addendum)
[place] PHYSICAL MEDICINE & REHABILITATION     PROGRESS NOTE    Subjective/Complaints: No new complaints this morning. Feels stronger  ROS: pt denies nausea, vomiting, diarrhea, cough, shortness of breath or chest pain   Objective: Vital Signs: Blood pressure 131/61, pulse (!) 51, temperature 98.4 F (36.9 C), temperature source Oral, resp. rate 20, height 5\' 3"  (1.6 m), weight 92.6 kg (204 lb 3.8 oz), SpO2 97 %. No results found. No results for input(s): WBC, HGB, HCT, PLT in the last 72 hours.  Recent Labs  03/16/17 0600  NA 137  K 5.1  CL 107  GLUCOSE 112*  BUN 31*  CREATININE 1.33*  CALCIUM 8.6*   CBG (last 3)  No results for input(s): GLUCAP in the last 72 hours.  Wt Readings from Last 3 Encounters:  03/14/17 92.6 kg (204 lb 3.8 oz)  03/06/17 90.2 kg (198 lb 13.7 oz)  02/09/17 86.5 kg (190 lb 12.8 oz)    Physical Exam:  Constitutional: She appears well-developed.  HENT:  Head: Normocephalic.  Eyes: EOMI. No swelling. anicteric Neck: Normal range of motion. Neck supple. No tracheal deviationpresent. No thyromegalypresent.  Cardiovascular:  RRR Respiratory: CTA B GG:EZMO NT/ND, BS+  Skin. Warm and dry Neurological: She is alert and oriented x 3. HOH Reasonable insight and awareness. Remains a little disinhibited and distracted. Decreased attention---some of this baseline??? Motor: B/l UE 5/5 proximal to distal B/l LE: 4/5 HF, 4/5 Add, 4/5 Hip Abd, KE, 5/5 ADF/PF--unchanged Psych: pleasant and cooperative  Assessment/Plan: 1. Functional and cognitive deficits secondary to SDH which require 3+ hours per day of interdisciplinary therapy in a comprehensive inpatient rehab setting. Physiatrist is providing close team supervision and 24 hour management of active medical problems listed below. Physiatrist and rehab team continue to assess barriers to discharge/monitor patient progress toward functional and medical goals.  Function:  Bathing Bathing  position   Position: Shower  Bathing parts Body parts bathed by patient: Right arm, Left arm, Right lower leg, Left lower leg, Chest, Abdomen, Front perineal area, Buttocks, Left upper leg, Right upper leg Body parts bathed by helper: Back  Bathing assist Assist Level: Supervision or verbal cues      Upper Body Dressing/Undressing Upper body dressing   What is the patient wearing?: Bra, Pull over shirt/dress Bra - Perfomed by patient: Thread/unthread right bra strap, Thread/unthread left bra strap, Hook/unhook bra (pull down sports bra) Bra - Perfomed by helper: Hook/unhook bra (pull down sports bra) Pull over shirt/dress - Perfomed by patient: Thread/unthread right sleeve, Thread/unthread left sleeve, Put head through opening, Pull shirt over trunk Pull over shirt/dress - Perfomed by helper: Pull shirt over trunk        Upper body assist Assist Level: Supervision or verbal cues      Lower Body Dressing/Undressing Lower body dressing   What is the patient wearing?: Underwear, Pants, Socks, Shoes Underwear - Performed by patient: Thread/unthread right underwear leg, Thread/unthread left underwear leg, Pull underwear up/down Underwear - Performed by helper: Pull underwear up/down Pants- Performed by patient: Thread/unthread right pants leg, Thread/unthread left pants leg, Pull pants up/down, Fasten/unfasten pants Pants- Performed by helper: Pull pants up/down     Socks - Performed by patient: Don/doff right sock, Don/doff left sock   Shoes - Performed by patient: Don/doff right shoe, Don/doff left shoe Shoes - Performed by helper: Don/doff right shoe, Don/doff left shoe          Lower body assist Assist for lower body dressing: Touching  or steadying assistance (Pt > 75%)      Toileting Toileting   Toileting steps completed by patient: Adjust clothing prior to toileting, Performs perineal hygiene, Adjust clothing after toileting Toileting steps completed by helper: Adjust  clothing prior to toileting, Performs perineal hygiene, Adjust clothing after toileting Toileting Assistive Devices: Grab bar or rail  Toileting assist Assist level: Supervision or verbal cues   Transfers Chair/bed transfer   Chair/bed transfer method: Ambulatory Chair/bed transfer assist level: Supervision or verbal cues Chair/bed transfer assistive device: Armrests, Medical sales representative     Max distance: 200 Assist level: Supervision or verbal cues   Wheelchair Wheelchair activity did not occur: Safety/medical concerns        Cognition Comprehension Comprehension assist level: Understands complex 90% of the time/cues 10% of the time  Expression Expression assist level: Expresses complex ideas: With no assist  Social Interaction Social Interaction assist level: Interacts appropriately with others with medication or extra time (anti-anxiety, antidepressant).  Problem Solving Problem solving assist level: Solves basic 75 - 89% of the time/requires cueing 10 - 24% of the time  Memory Memory assist level: Recognizes or recalls 90% of the time/requires cueing < 10% of the time   Medical Problem List and Plan: 1. 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  -ELOS 03/18/17 2. DVT Prophylaxis/Anticoagulation: SCDs. Patient is ambulatory 3. Pain Managementwith history of bilateral total hip replacement and chronic back pain: Ultram as needed  -added kpad for back/hips 4. Mood: Wellbutrin 300 mg daily, hydroxyzine 25 mg daily at bedtime 5. Neuropsych: This patient iscapable of making decisions on herown behalf. 6. Skin/Wound Care: Routine skin checks 7. Fluids/Electrolytes/Nutrition: I personally reviewed the patient's labs today.   -BUN/Cr  with elevation (has CKD however) Continue to hold lasix (20mg  daily)  -recheck again saturday 8.Hypertension. Norvasc 10 mg daily, Lasix 20 mg daily (held), Cozaar 100 mg daily. Monitor with increased mobility 9.Gout. Allopurinol. Monitor for any gout flareups 10. Overactive bladder. Ditropan 5 mg daily.   11.Hyperlipidemia. Lipitor 12.GERD. Protonix 13.Constipation. Laxative assistance 14. ABLA: hgb 10.2 today, no signs of blood loss 15. Leukocytosis: 11.4 today. I suspect this is steroid related  -UCX + for Ecoli 100k---sensitive to keflex --day 4/5  16. Insomnia/sweating: improving  -weaning steroids    -keep room warm  -prn trazodone 17 Acute on Chronic kidney disease  -continue to hold lasix   -repeat BMET in am   LOS (Days) 8 A FACE TO FACE EVALUATION WAS PERFORMED  Meredith Staggers, MD 03/16/2017 9:51 AM

## 2017-03-16 NOTE — Progress Notes (Addendum)
Social Work Discharge Note  The overall goal for the admission was met for:   Discharge location: Yes - home with daughter and family to provide 24/7 assistance.  Length of Stay: Yes - 10 days (with discharge 03/18/17)  Discharge activity level: Yes - supervision overall  Home/community participation: Yes  Services provided included: MD, RD, PT, OT, SLP, RN, TR, Pharmacy, Neuropsych and SW  Financial Services: [employer]  and Medicaid  Follow-up services arranged: Outpatient: PT, OT, ST via [facility], DME: tub bench via [facility] and Patient/Family has no preference for HH/DME agencies  Comments (or additional information):  Patient/Family verbalized understanding of follow-up arrangements: Yes  Individual responsible for coordination of the follow-up plan: pt/ daughter  Confirmed correct DME delivered: Lennart Pall 03/16/2017    Ellarose Brandi

## 2017-03-16 NOTE — Progress Notes (Signed)
Speech Language Pathology Daily Session Note  Patient Details  Name: [name] MRN: [id number] Date of Birth: 01-Apr-1941  Today's Date: 03/16/2017 SLP Individual Time: 7943-2761 SLP Individual Time Calculation (min): 40 min  Short Term Goals: Week 1: SLP Short Term Goal 1 (Week 1): Pt will recall new, complex, daily information with min assist verbal cues to use external aids.   SLP Short Term Goal 2 (Week 1): Pt will complete semi-complex tasks wtih min assist verbal cues for functional problem solving.   SLP Short Term Goal 3 (Week 1): Pt will recognize and correct errors during semi-complex tasks wtih mina ssist verbal cues.    Skilled Therapeutic Interventions: Skilled treatment session focused on cognitive goals.  SLP facilitated session by administering the MoCA-Version 7.1. Patient scored 17/30 points with a score of 26 or above considered normal. Patient demonstrated deficits in the areas of short-term recall, executive functioning and visual-spatial. However, patient's cognitive functioning is improved with functional tasks in which patient requires overall Min A. Patient reported emergent awareness of impairments in short-term recall, therefore, recommend education of incorporating memory strategies at home to maximize safety after discharge. Patient left upright in recliner with all needs within reach. Continue with current plan of care.      Function:  Eating Eating   Modified Consistency Diet: No Eating Assist Level: No help, No cues           Cognition Comprehension Comprehension assist level: Understands complex 90% of the time/cues 10% of the time  Expression   Expression assist level: Expresses complex ideas: With no assist  Social Interaction Social Interaction assist level: Interacts appropriately with others with medication or extra time (anti-anxiety, antidepressant).  Problem Solving Problem solving assist level: Solves basic 75 - 89% of the time/requires  cueing 10 - 24% of the time  Memory Memory assist level: Recognizes or recalls 75 - 89% of the time/requires cueing 10 - 24% of the time    Pain No/Denies Pain   Therapy/Group: Individual Therapy  Keari Miu 03/16/2017, 12:38 PM

## 2017-03-16 NOTE — Progress Notes (Signed)
Speech Language Pathology Daily Session Note  Patient Details  Name: [name] MRN: [id number] Date of Birth: 01/08/1941  Today's Date: 03/16/2017 SLP Individual Time: 5320-2334 SLP Individual Time Calculation (min): 30 min  Short Term Goals: Week 1: SLP Short Term Goal 1 (Week 1): Pt will recall new, complex, daily information with min assist verbal cues to use external aids.   SLP Short Term Goal 2 (Week 1): Pt will complete semi-complex tasks wtih min assist verbal cues for functional problem solving.   SLP Short Term Goal 3 (Week 1): Pt will recognize and correct errors during semi-complex tasks wtih mina ssist verbal cues.    Skilled Therapeutic Interventions:  Pt was seen for skilled ST targeting cognitive goals.  SLP facilitated the session with skilled education regarding memory compensatory strategies given that pt reports that her short term memory isn't back to prior level of function.  SLP provided pt with a handout of strategies to facilitate carryover in between therapy sessions.  Strategies discussed included writing things down, keeping items in the same place, consistent routines, visual reminders, and use of self talk.  Pt verbalized understanding of instruction.  Will review with pt and her daughter during family education tomorrow.  Continue per current plan of care.       Function:  Eating Eating               Cognition Comprehension Comprehension assist level: Understands complex 90% of the time/cues 10% of the time  Expression   Expression assist level: Expresses complex ideas: With no assist  Social Interaction Social Interaction assist level: Interacts appropriately with others with medication or extra time (anti-anxiety, antidepressant).  Problem Solving Problem solving assist level: Solves basic 75 - 89% of the time/requires cueing 10 - 24% of the time  Memory Memory assist level: Recognizes or recalls 75 - 89% of the time/requires cueing 10 - 24%  of the time    Pain Pain Assessment Pain Assessment: No/denies pain  Therapy/Group: Individual Therapy  Darnice Comrie, Selinda Orion 03/16/2017, 4:10 PM

## 2017-03-16 NOTE — Progress Notes (Signed)
Physical Therapy Session Note  Patient Details  Name: [name] MRN: [id number] Date of Birth: 1941-01-18  Today's Date: 03/16/2017 PT Individual Time: 1000-1057 PT Individual Time Calculation (min): 57 min    Skilled Therapeutic Interventions/Progress Updates:    Pt seated up in recliner chair upon therapist entering the room.  Pt ambulated with RW to gym with supervision, performing all transfers and mobility with supervision.  Requires verbal cues for safety and occasional contact guard when ambulating backwards to seat.  Pt also performed [place] based exercises for 1 x 10 standing hamstring curls, 5 x 10 heel raises and toe raises, and standing hip abduction.  Also worked on performing stairs which pt did with supervision ascending and CGA descending.  Following session, pt left up in room in recliner chair with nursing notified of pt location, call bell in reach, and needs met.  Therapy Documentation Precautions:  Precautions Precautions: Fall Restrictions Weight Bearing Restrictions: No   See Function Navigator for Current Functional Status.   Therapy/Group: Individual Therapy  Bethel Gaglio Hilario Quarry 03/16/2017, 12:11 PM

## 2017-03-16 NOTE — Progress Notes (Signed)
Occupational Therapy Session Note  Patient Details  Name: [name] MRN: [id number] Date of Birth: 1941/05/10  Today's Date: 03/16/2017 OT Individual Time: 1405-1500 OT Individual Time Calculation (min): 55 min    Short Term Goals: Week 1:  OT Short Term Goal 1 (Week 1): Pt will complete stand pivot transfer to toilet with min A OT Short Term Goal 2 (Week 1): Pt will stand to complete grooming task with steadying assist to increase functional activity tolerance OT Short Term Goal 3 (Week 1): Pt will complete clothing management during toileting task with no more than mod steadying assist OT Short Term Goal 4 (Week 1): Pt will dress UB with set-up assist  Skilled Therapeutic Interventions/Progress Updates:    Treatment session with focus on functional mobility, activity tolerance, and standing balance.  Pt received asleep in recliner, required increased time to arouse due to impaired hearing.  Ambulated 150 feet with Rollator supervision.  Engaged in table top task at sit > stand level with focus on hand placement and locking of Rollator brakes with mobility.  Returned to room and left in recliner with crossword puzzle pages and pt reports enjoying cross word in newspaper but had not received a paper this date.  Therapy Documentation Precautions:  Precautions Precautions: Fall Restrictions Weight Bearing Restrictions: No Pain:  Pt with no c/o pain  See Function Navigator for Current Functional Status.   Therapy/Group: Individual Therapy  Simonne Come 03/16/2017, 3:26 PM

## 2017-03-17 ENCOUNTER — Encounter (HOSPITAL_COMMUNITY): Payer: Self-pay | Admitting: Speech Pathology

## 2017-03-17 ENCOUNTER — Inpatient Hospital Stay (HOSPITAL_COMMUNITY): Payer: PPO | Admitting: Occupational Therapy

## 2017-03-17 ENCOUNTER — Ambulatory Visit (HOSPITAL_COMMUNITY): Payer: Self-pay | Admitting: Physical Therapy

## 2017-03-17 LAB — BASIC METABOLIC PANEL
Anion gap: 7 (ref 5–15)
BUN: 30 mg/dL — ABNORMAL HIGH (ref 6–20)
CO2: 24 mmol/L (ref 22–32)
Calcium: 8.4 mg/dL — ABNORMAL LOW (ref 8.9–10.3)
Chloride: 105 mmol/L (ref 101–111)
Creatinine, Ser: 1.42 mg/dL — ABNORMAL HIGH (ref 0.44–1.00)
GFR calc Af Amer: 41 mL/min — ABNORMAL LOW (ref >60)
GFR calc non Af Amer: 35 mL/min — ABNORMAL LOW (ref >60)
Glucose, Bld: 93 mg/dL (ref 65–99)
Potassium: 4.6 mmol/L (ref 3.5–5.1)
Sodium: 136 mmol/L (ref 135–145)

## 2017-03-17 NOTE — Progress Notes (Signed)
Physical Therapy Discharge Summary  Patient Details  Name: [name] MRN: [id number] Date of Birth: 01/16/1941  Today's Date: 03/17/2017 PT Individual Time:1100-1200   PT Time: 60min    Patient has met 8 of 8 long term goals due to improved activity tolerance, improved balance, improved postural control, increased strength, improved attention, improved awareness and improved coordination.  Patient to discharge at an ambulatory level Supervision.   Patient's care partner is independent to provide the necessary physical and cognitive assistance at discharge.  Reasons goals not met: all treatment goals met  Recommendation:  Patient will benefit from ongoing skilled PT services in outpatient setting to continue to advance safe functional mobility, address ongoing impairments in balance, strength, endurance, awareness, safety, and minimize fall risk.  Equipment: No equipment provided  Reasons for discharge: treatment goals met and discharge from hospital  Patient/family agrees with progress made and goals achieved: Yes   PT Treatment: Pt instructed in grad day assessment to measure progress towards goals. Pt able to demonstrate supervision assist overall with RW for transfers , gait, and stairs. Pt's daughter present for Family education. Cues for awareness of potentially distracting environments to cues pt for safety with use of rollator. Patient returned too room and left sitting in recliner with call bell in reach and all needs met.     PT Discharge Precautions/Restrictions Precautions Precautions: Fall Restrictions Weight Bearing Restrictions: No Vital Signs Therapy Vitals Temp: 98.6 F (37 C) Temp Source: Oral Pulse Rate: 60 Resp: 16 BP: 137/64 Patient Position (if appropriate): Lying Oxygen Therapy SpO2: 98 % O2 Device: Not Delivered Pain Pain Assessment Pain Assessment: No/denies pain Pain Score: 0-No pain Vision/Perception  Perception Perception: Within  Functional Limits Praxis Praxis: Intact  Cognition Overall Cognitive Status: Impaired/Different from baseline Arousal/Alertness: Awake/alert Orientation Level: Oriented X4 Attention: Sustained Sustained Attention: Appears intact Selective Attention: Appears intact Memory: Impaired Memory Impairment: Decreased recall of new information;Retrieval deficit Awareness: Impaired Awareness Impairment: Emergent impairment Problem Solving: Impaired Problem Solving Impairment: Functional complex Executive Function: Self Monitoring;Self Correcting Self Monitoring: Impaired Self Monitoring Impairment: Functional basic;Verbal basic Self Correcting: Impaired Self Correcting Impairment: Functional basic;Verbal basic Safety/Judgment: Appears intact Sensation Sensation Light Touch: Appears Intact Stereognosis: Appears Intact Hot/Cold: Appears Intact Proprioception: Appears Intact Additional Comments: Pt reports hx of tingling in B LEs since hip surgery 10 years ago Coordination Gross Motor Movements are Fluid and Coordinated: No Fine Motor Movements are Fluid and Coordinated: Yes Motor  Motor Motor: Other (comment) Motor - Discharge Observations: Generalized weakness  Mobility Bed Mobility Bed Mobility: Rolling Left;Supine to Sit;Sit to Supine Rolling Left: 5: Supervision Supine to Sit: 5: Supervision Supine to Sit Details: Verbal cues for technique Sit to Supine: 5: Supervision Sit to Supine - Details: Verbal cues for technique Transfers Transfers: Yes Sit to Stand: 5: Supervision Sit to Stand Details: Verbal cues for precautions/safety Stand Pivot Transfers: 5: Supervision (with Rollator) Stand Pivot Transfer Details: Verbal cues for precautions/safety Locomotion  Ambulation Ambulation: Yes Ambulation/Gait Assistance: 5: Supervision Ambulation Distance (Feet): 150 Feet Assistive device: Rollator Ambulation/Gait Assistance Details: Verbal cues for  precautions/safety Gait Gait: Yes Gait Pattern: Impaired Gait Pattern: Decreased step length - left;Decreased step length - right Stairs / Additional Locomotion Stairs: Yes Stairs Assistance: 5: Supervision Stairs Assistance Details: Verbal cues for precautions/safety Stair Management Technique: Two rails Number of Stairs: 8 Height of Stairs: 6 Wheelchair Mobility Wheelchair Mobility: No  Trunk/Postural Assessment  Cervical Assessment Cervical Assessment: Exceptions to WFL (forward head) Thoracic Assessment Thoracic Assessment: Exceptions to   WFL (rounded shoulders) Lumbar Assessment Lumbar Assessment: Exceptions to [facility] (posterior pelvic tilt) Postural Control Postural Control: Within Functional Limits  Balance Balance Balance Assessed: Yes Static Sitting Balance Static Sitting - Balance Support: Feet supported Static Sitting - Level of Assistance: 6: Modified independent (Device/Increase time) Dynamic Sitting Balance Dynamic Sitting - Balance Support: During functional activity;Feet supported;Right upper extremity supported;Left upper extremity supported Dynamic Sitting - Level of Assistance: 6: Modified independent (Device/Increase time) Static Standing Balance Static Standing - Balance Support: During functional activity;Bilateral upper extremity supported Static Standing - Level of Assistance: 5: Stand by assistance Dynamic Standing Balance Dynamic Standing - Level of Assistance: 5: Stand by assistance Extremity Assessment  RUE Assessment RUE Assessment: Within Functional Limits LUE Assessment LUE Assessment: Within Functional Limits RLE Assessment RLE Assessment: Exceptions to [facility] (4+/5 knee extensors 4/5 hip flexion) LLE Assessment LLE Assessment: Within Functional Limits   See Function Navigator for Current Functional Status.  Lorie Phenix 03/17/2017, 2:50 PM

## 2017-03-17 NOTE — Progress Notes (Signed)
[facility] PHYSICAL MEDICINE & REHABILITATION     PROGRESS NOTE    Subjective/Complaints: No new complaints this morning. Some HA earleier relieved with analgesic  ROS: pt denies nausea, vomiting, diarrhea, cough, shortness of breath or chest pain   Objective: Vital Signs: Blood pressure (!) 139/56, pulse (!) 55, temperature 98.4 F (36.9 C), temperature source Oral, resp. rate 18, height 5\' 3"  (1.6 m), weight 92.6 kg (204 lb 3.8 oz), SpO2 96 %. No results found. No results for input(s): WBC, HGB, HCT, PLT in the last 72 hours.  Recent Labs  03/16/17 0600 03/17/17 0449  NA 137 136  K 5.1 4.6  CL 107 105  GLUCOSE 112* 93  BUN 31* 30*  CREATININE 1.33* 1.42*  CALCIUM 8.6* 8.4*   CBG (last 3)  No results for input(s): GLUCAP in the last 72 hours.  Wt Readings from Last 3 Encounters:  03/14/17 92.6 kg (204 lb 3.8 oz)  03/06/17 90.2 kg (198 lb 13.7 oz)  02/09/17 86.5 kg (190 lb 12.8 oz)    Physical Exam:  Constitutional: She appears well-developed.  HENT:  Head: Normocephalic.  Eyes: EOMI. No swelling. anicteric Neck: Normal range of motion. Neck supple. No tracheal deviationpresent. No thyromegalypresent.  Cardiovascular:  RRR Respiratory: CTA B EU:MPNT NT/ND, BS+  Skin. Warm and dry Neurological: She is alert and oriented x 3. HOH Reasonable insight and awareness. Remains a little disinhibited and distracted. Decreased attention---some of this baseline??? Motor: B/l UE 5/5 proximal to distal B/l LE: 4/5 HF, 4/5 Add, 4/5 Hip Abd, KE, 5/5 ADF/PF--unchanged Psych: pleasant and cooperative  Assessment/Plan: 1. Functional and cognitive deficits secondary to SDH which require 3+ hours per day of interdisciplinary therapy in a comprehensive inpatient rehab setting. Physiatrist is providing close team supervision and 24 hour management of active medical problems listed below. Physiatrist and rehab team continue to assess barriers to discharge/monitor patient  progress toward functional and medical goals.  Function:  Bathing Bathing position   Position: Shower  Bathing parts Body parts bathed by patient: Right arm, Left arm, Right lower leg, Left lower leg, Chest, Abdomen, Front perineal area, Buttocks, Left upper leg, Right upper leg Body parts bathed by helper: Back  Bathing assist Assist Level: Supervision or verbal cues      Upper Body Dressing/Undressing Upper body dressing   What is the patient wearing?: Bra, Pull over shirt/dress Bra - Perfomed by patient: Thread/unthread right bra strap, Thread/unthread left bra strap, Hook/unhook bra (pull down sports bra) Bra - Perfomed by helper: Hook/unhook bra (pull down sports bra) Pull over shirt/dress - Perfomed by patient: Thread/unthread right sleeve, Thread/unthread left sleeve, Put head through opening, Pull shirt over trunk Pull over shirt/dress - Perfomed by helper: Pull shirt over trunk        Upper body assist Assist Level: Supervision or verbal cues      Lower Body Dressing/Undressing Lower body dressing   What is the patient wearing?: Underwear, Pants, Socks, Shoes Underwear - Performed by patient: Thread/unthread right underwear leg, Thread/unthread left underwear leg, Pull underwear up/down Underwear - Performed by helper: Pull underwear up/down Pants- Performed by patient: Thread/unthread right pants leg, Thread/unthread left pants leg, Pull pants up/down, Fasten/unfasten pants Pants- Performed by helper: Pull pants up/down     Socks - Performed by patient: Don/doff right sock, Don/doff left sock   Shoes - Performed by patient: Don/doff right shoe, Don/doff left shoe Shoes - Performed by helper: Don/doff right shoe, Don/doff left shoe  Lower body assist Assist for lower body dressing: Touching or steadying assistance (Pt > 75%)      Toileting Toileting   Toileting steps completed by patient: Adjust clothing prior to toileting, Performs perineal hygiene,  Adjust clothing after toileting Toileting steps completed by helper: Adjust clothing prior to toileting, Adjust clothing after toileting, Performs perineal hygiene Toileting Assistive Devices: Grab bar or rail  Toileting assist Assist level: Supervision or verbal cues   Transfers Chair/bed transfer   Chair/bed transfer method: Ambulatory Chair/bed transfer assist level: Supervision or verbal cues Chair/bed transfer assistive device: Armrests, Medical sales representative     Max distance:  (150 ft) Assist level: Supervision or verbal cues   Wheelchair Wheelchair activity did not occur: Safety/medical concerns        Cognition Comprehension Comprehension assist level: Understands complex 90% of the time/cues 10% of the time  Expression Expression assist level: Expresses complex ideas: With no assist  Social Interaction Social Interaction assist level: Interacts appropriately with others with medication or extra time (anti-anxiety, antidepressant).  Problem Solving Problem solving assist level: Solves basic 75 - 89% of the time/requires cueing 10 - 24% of the time  Memory Memory assist level: Recognizes or recalls 75 - 89% of the time/requires cueing 10 - 24% of the time   Medical Problem List and Plan: 1. 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  -ELOS 03/18/17 2. DVT Prophylaxis/Anticoagulation: SCDs. Patient is ambulatory 3. Pain Managementwith history of bilateral total hip replacement and chronic back pain: Ultram as needed, may use for HA as well  -added kpad for back/hips 4. Mood: Wellbutrin 300 mg daily, hydroxyzine 25 mg daily at bedtime 5. Neuropsych: This patient iscapable of making decisions on herown behalf. 6. Skin/Wound  Care: Routine skin checks 7. Fluids/Electrolytes/Nutrition: I personally reviewed the patient's labs today.   -BUN/Cr with elevation (has CKD however) Continue to hold lasix (20mg  daily)  -recheck again saturday 8.Hypertension. Norvasc 10 mg daily, Lasix 20 mg daily (held), Cozaar 100 mg daily. Monitor with increased mobility 9.Gout. Allopurinol. Monitor for any gout flareups 10. Overactive bladder. Ditropan 5 mg daily.   11.Hyperlipidemia. Lipitor 12.GERD. Protonix 13.Constipation. Laxative assistance 14. ABLA: hgb 10.2 today, no signs of blood loss 15. Leukocytosis: 11.4 today. I suspect this is steroid related  -UCX + for Ecoli 100k---sensitive to keflex --day 4/5  16. Insomnia/sweating: improving  -weaning steroids    -keep room warm  -prn trazodone 17 Acute on Chronic kidney disease  -continue to hold lasix   -repeat BMET Stable, will cont to hold Lasix, recheck in am   LOS (Days) 9 A FACE TO FACE EVALUATION WAS PERFORMED  Charlett Blake, MD 03/17/2017 6:41 AM

## 2017-03-17 NOTE — Progress Notes (Signed)
Occupational Therapy Discharge Summary  Patient Details  Name: [name] MRN: [id number] Date of Birth: Mar 07, 1941  Patient has met 12 of 12 long term goals due to improved activity tolerance, improved balance, ability to compensate for deficits, improved awareness and improved coordination.  Patient to discharge at overall Supervision level.  Patient's care partner, her daughter, is independent to provide the necessary cognitive assistance at discharge.    Reasons goals not met: all goals met  Recommendation:  Patient will benefit from ongoing skilled OT services in home health setting to continue to advance functional skills in the area of BADL and iADL.  Equipment: TTB  Reasons for discharge: treatment goals met  Patient/family agrees with progress made and goals achieved: Yes  OT Discharge Precautions/Restrictions  Precautions Precautions: Fall Restrictions Weight Bearing Restrictions: No  Pain Pain Assessment Pain Assessment: No/denies pain Pain Score: 0-No pain  ADL ADL ADL Comments: see Functional Assessment Tool  Vision Baseline Vision/History: Wears glasses;Cataracts Wears Glasses: At all times Patient Visual Report: No change from baseline  Perception  Perception: Within Functional Limits  Praxis Praxis: Intact  Cognition Orientation Level: Oriented X4 Sustained Attention: Appears intact Selective Attention: Appears intact Safety/Judgment: Appears intact   Sensation Sensation Light Touch: Appears Intact Stereognosis: Appears Intact Hot/Cold: Appears Intact Proprioception: Appears Intact Additional Comments: Pt reports hx of tingling in B LEs since hip surgery 10 years ago Coordination Gross Motor Movements are Fluid and Coordinated: No Fine Motor Movements are Fluid and Coordinated: Yes  Motor  Motor Motor: Other (comment) Motor - Discharge Observations: Generalized weakness  Mobility  Bed Mobility Bed Mobility: Rolling  Left;Supine to Sit;Sit to Supine Rolling Left: 5: Supervision Supine to Sit: 5: Supervision Supine to Sit Details: Verbal cues for technique Sit to Supine: 5: Supervision Sit to Supine - Details: Verbal cues for technique Transfers Sit to Stand: 5: Supervision Sit to Stand Details: Verbal cues for precautions/safety   Trunk/Postural Assessment  Cervical Assessment Cervical Assessment: Exceptions to [facility] (forward head) Thoracic Assessment Thoracic Assessment: Exceptions to [facility] (rounded shoulders) Lumbar Assessment Lumbar Assessment: Exceptions to [facility] (posterior pelvic tilt) Postural Control Postural Control: Within Functional Limits   Balance Balance Balance Assessed: Yes Static Sitting Balance Static Sitting - Balance Support: Feet supported Static Sitting - Level of Assistance: 6: Modified independent (Device/Increase time) Dynamic Sitting Balance Dynamic Sitting - Balance Support: During functional activity;Feet supported;Right upper extremity supported;Left upper extremity supported Dynamic Sitting - Level of Assistance: 6: Modified independent (Device/Increase time) Static Standing Balance Static Standing - Balance Support: During functional activity;Bilateral upper extremity supported Static Standing - Level of Assistance: 5: Stand by assistance Dynamic Standing Balance Dynamic Standing - Level of Assistance: 5: Stand by assistance  Extremity/Trunk Assessment RUE Assessment RUE Assessment: Within Functional Limits LUE Assessment LUE Assessment: Within Functional Limits   See Function Navigator for Current Functional Status.  Salome Spotted 03/18/2017, 3:35 PM

## 2017-03-17 NOTE — Progress Notes (Signed)
Occupational Therapy Session Note  Patient Details  Name: [name] MRN: [id number] Date of Birth: 05-15-1941  Today's Date: 03/17/2017 OT Individual Time: 1000-1100 OT Individual Time Calculation (min): 60 min    Short Term Goals: Week 1:  OT Short Term Goal 1 (Week 1): Pt will complete stand pivot transfer to toilet with min A OT Short Term Goal 2 (Week 1): Pt will stand to complete grooming task with steadying assist to increase functional activity tolerance OT Short Term Goal 3 (Week 1): Pt will complete clothing management during toileting task with no more than mod steadying assist OT Short Term Goal 4 (Week 1): Pt will dress UB with set-up assist  Skilled Therapeutic Interventions/Progress Updates:   Upon entering the room, pt seated in recliner chair awaiting therapist with daughter, [name], present for family education. Pt very excited to go home tomorrow. OT reviewed pt progress to caregiver and reviewed expectations for family education. [name] provided supervision and cues for pt throughout session while pt performed bathing and dressing tasks. Pt continues to need safety cues to lock rollator for safety. OT also recommended purchase of baby monitor for nighttime as caregiver will be sleeping upstairs and pt downstairs. OT also providing education regarding outpatient recommendation. No further questions at time time. Pt seated in recliner chair with call bell and all needed items within reach upon exiting the room.   Therapy Documentation Precautions:  Precautions Precautions: Fall Restrictions Weight Bearing Restrictions: No  Pain: Pain Assessment Pain Assessment: No/denies pain Pain Score: 0-No pain Vision Baseline Vision/History: Wears glasses;Cataracts Patient Visual Report: No change from baseline Perception  Perception: Within Functional Limits Praxis Praxis: Intact  See Function Navigator for Current Functional Status.   Therapy/Group: Individual  Therapy  Gypsy Decant 03/17/2017, 12:32 PM

## 2017-03-17 NOTE — Progress Notes (Signed)
Speech Language Pathology Discharge Summary  Patient Details  Name: [name] MRN: [id number] Date of Birth: [birth year]  Today's Date: 03/17/2017 SLP Individual Time: 0933-1000 SLP Individual Time Calculation (min): 27 min   Skilled Therapeutic Interventions:  Pt was seen for skilled ST targeting completion of family education prior to discharge.  Pt's daughter was present during today's therapy session and remained actively engaged in training.  SLP discussed pt's current level of function and goals in therapy.  Recommended that pt have 24/7 supervision at discharge in addition to assistance with medication and financial management at discharge; daughter was in agreement and reported that she will be able to provide recommended level of assist.  SLP reviewed memory compensatory strategies with pt's daughter, utilizing handout from yesterday's therapy session.  Pt was left in recliner with daughter at bedside.  All questions were answered to their satisfaction at this time.  Pt is ready for discharge tomorrow.     Patient has met 3 of 3 long term goals.  Patient to discharge at overall Supervision;Min level.  Reasons goals not met:     Clinical Impression/Discharge Summary:   Pt has made functional gains while inpatient and is discharging having met 3 out of 3 long term goals.  Pt is currently min assist-supervision for semi-complex tasks due to mild cognitive impairment.  Pt has demonstrated improved use of memory compensatory aids while inpatient.  Pt and family education is complete at this time.  Pt is discharging home with 24/7 supervision from multiple family members and a paid caregiver.    Care Partner:  Caregiver Able to Provide Assistance: Yes  Type of Caregiver Assistance: Physical;Cognitive  Recommendation:  24 hour supervision/assistance;Home Health SLP;Outpatient SLP  Rationale for SLP Follow Up: Maximize cognitive function and independence;Reduce caregiver burden    Equipment: none recommended by SLP    Reasons for discharge: Discharged from hospital   Patient/Family Agrees with Progress Made and Goals Achieved: Yes   Function:  Eating Eating                 Cognition Comprehension Comprehension assist level: Follows basic conversation/direction with no assist  Expression   Expression assist level: Expresses basic needs/ideas: With no assist  Social Interaction Social Interaction assist level: Interacts appropriately 90% of the time - Needs monitoring or encouragement for participation or interaction.  Problem Solving Problem solving assist level: Solves basic 75 - 89% of the time/requires cueing 10 - 24% of the time  Memory Memory assist level: Recognizes or recalls 75 - 89% of the time/requires cueing 10 - 24% of the time   Emilio Math 03/17/2017, 12:40 PM

## 2017-03-18 ENCOUNTER — Inpatient Hospital Stay (HOSPITAL_COMMUNITY): Payer: Self-pay

## 2017-03-18 LAB — BASIC METABOLIC PANEL
Anion gap: 8 (ref 5–15)
BUN: 29 mg/dL — ABNORMAL HIGH (ref 6–20)
CO2: 26 mmol/L (ref 22–32)
Calcium: 8.6 mg/dL — ABNORMAL LOW (ref 8.9–10.3)
Chloride: 104 mmol/L (ref 101–111)
Creatinine, Ser: 1.44 mg/dL — ABNORMAL HIGH (ref 0.44–1.00)
GFR calc Af Amer: 40 mL/min — ABNORMAL LOW (ref >60)
GFR calc non Af Amer: 35 mL/min — ABNORMAL LOW (ref >60)
Glucose, Bld: 103 mg/dL — ABNORMAL HIGH (ref 65–99)
Potassium: 5.3 mmol/L — ABNORMAL HIGH (ref 3.5–5.1)
Sodium: 138 mmol/L (ref 135–145)

## 2017-03-18 LAB — URINALYSIS, ROUTINE W REFLEX MICROSCOPIC
Bacteria, UA: NONE SEEN
Bilirubin Urine: NEGATIVE
Glucose, UA: NEGATIVE mg/dL
Ketones, ur: NEGATIVE mg/dL
Leukocytes, UA: NEGATIVE
Nitrite: NEGATIVE
Protein, ur: NEGATIVE mg/dL
Specific Gravity, Urine: 1.012 (ref 1.005–1.030)
pH: 7 (ref 5.0–8.0)

## 2017-03-18 LAB — CBC
HCT: 33.8 % — ABNORMAL LOW (ref 36.0–46.0)
Hemoglobin: 10.8 g/dL — ABNORMAL LOW (ref 12.0–15.0)
MCH: 30.3 pg (ref 26.0–34.0)
MCHC: 32 g/dL (ref 30.0–36.0)
MCV: 94.7 fL (ref 78.0–100.0)
Platelets: 248 10*3/uL (ref 150–400)
RBC: 3.57 MIL/uL — ABNORMAL LOW (ref 3.87–5.11)
RDW: 16.1 % — ABNORMAL HIGH (ref 11.5–15.5)
WBC: 11.7 10*3/uL — ABNORMAL HIGH (ref 4.0–10.5)

## 2017-03-18 MED ORDER — ESTRADIOL 0.1 MG/GM VA CREA
1.0000 | TOPICAL_CREAM | Freq: Every day | VAGINAL | Status: DC
  Administered 2017-03-18: 1 via VAGINAL
  Filled 2017-03-18: qty 42.5

## 2017-03-18 MED ORDER — FLUCONAZOLE 100 MG PO TABS
150.0000 mg | ORAL_TABLET | Freq: Once | ORAL | Status: AC
  Administered 2017-03-18: 150 mg via ORAL
  Filled 2017-03-18: qty 2

## 2017-03-18 NOTE — Progress Notes (Signed)
Discussed medications with patient's daughter prior to discharge. Patient and family expressed satisfaction with their stay and preparation for home. Escorted to car with all belongings by NT.

## 2017-03-18 NOTE — Progress Notes (Signed)
[facility] PHYSICAL MEDICINE & REHABILITATION     PROGRESS NOTE    Subjective/Complaints: Called by RN, pt urinated clear urine but when she wiped there was a spot of blood on tissue, no pain in vaginal area no pain with urination Vaginal itching  PMH sig for atrophic vaginitis , hx of ovarian CA with TAH BSO, tends to get yeast infections after antibiotics  ROS: pt denies nausea, vomiting, diarrhea, cough, shortness of breath or chest pain   Objective: Vital Signs: Blood pressure (!) 142/60, pulse 60, temperature 98.7 F (37.1 C), temperature source Oral, resp. rate 18, height 5\' 3"  (1.6 m), weight 92.6 kg (204 lb 3.8 oz), SpO2 100 %. No results found.  Recent Labs  03/18/17 0611  WBC 11.7*  HGB 10.8*  HCT 33.8*  PLT 248    Recent Labs  03/16/17 0600 03/17/17 0449  NA 137 136  K 5.1 4.6  CL 107 105  GLUCOSE 112* 93  BUN 31* 30*  CREATININE 1.33* 1.42*  CALCIUM 8.6* 8.4*   CBG (last 3)  No results for input(s): GLUCAP in the last 72 hours.  Wt Readings from Last 3 Encounters:  03/14/17 92.6 kg (204 lb 3.8 oz)  03/06/17 90.2 kg (198 lb 13.7 oz)  02/09/17 86.5 kg (190 lb 12.8 oz)    Physical Exam:  Constitutional: She appears well-developed.  HENT:  Head: Normocephalic.  Eyes: EOMI. No swelling. anicteric Neck: Normal range of motion. Neck supple. No tracheal deviationpresent. No thyromegalypresent.  Cardiovascular:  RRR Respiratory: CTA B UK:GURK NT/ND, BS+  GU- labia majora and labia minora without external lesions, urethral meatus without blood, no vaginal discharge Skin. Warm and dry Neurological: She is alert and oriented x 3. HOH Reasonable insight and awareness. Remains a little disinhibited and distracted. Decreased attention---some of this baseline??? Motor: B/l UE 5/5 proximal to distal B/l LE: 4/5 HF, 4/5 Add, 4/5 Hip Abd, KE, 5/5 ADF/PF--unchanged Psych: pleasant and cooperative  Assessment/Plan: 1. Functional and cognitive deficits  secondary to SDH which require 3+ hours per day of interdisciplinary therapy in a comprehensive inpatient rehab setting. Physiatrist is providing close team supervision and 24 hour management of active medical problems listed below. Physiatrist and rehab team continue to assess barriers to discharge/monitor patient progress toward functional and medical goals.  Function:  Bathing Bathing position   Position: Shower  Bathing parts Body parts bathed by patient: Right arm, Left arm, Right lower leg, Left lower leg, Chest, Abdomen, Front perineal area, Buttocks, Left upper leg, Right upper leg Body parts bathed by helper: Back  Bathing assist Assist Level: Supervision or verbal cues      Upper Body Dressing/Undressing Upper body dressing   What is the patient wearing?: Bra, Pull over shirt/dress Bra - Perfomed by patient: Thread/unthread right bra strap, Thread/unthread left bra strap, Hook/unhook bra (pull down sports bra) Bra - Perfomed by helper: Hook/unhook bra (pull down sports bra) Pull over shirt/dress - Perfomed by patient: Thread/unthread right sleeve, Thread/unthread left sleeve, Put head through opening, Pull shirt over trunk Pull over shirt/dress - Perfomed by helper: Pull shirt over trunk        Upper body assist Assist Level: More than reasonable time      Lower Body Dressing/Undressing Lower body dressing   What is the patient wearing?: Underwear, Pants, Socks, Shoes Underwear - Performed by patient: Thread/unthread right underwear leg, Thread/unthread left underwear leg, Pull underwear up/down Underwear - Performed by helper: Pull underwear up/down Pants- Performed by patient: Thread/unthread  right pants leg, Thread/unthread left pants leg, Pull pants up/down, Fasten/unfasten pants Pants- Performed by helper: Pull pants up/down     Socks - Performed by patient: Don/doff right sock, Don/doff left sock   Shoes - Performed by patient: Don/doff right shoe, Don/doff left  shoe Shoes - Performed by helper: Don/doff right shoe, Don/doff left shoe          Lower body assist Assist for lower body dressing: Supervision or verbal cues      Toileting Toileting   Toileting steps completed by patient: Adjust clothing prior to toileting, Performs perineal hygiene, Adjust clothing after toileting Toileting steps completed by helper: Adjust clothing prior to toileting, Adjust clothing after toileting, Performs perineal hygiene Toileting Assistive Devices: Grab bar or rail  Toileting assist Assist level: Supervision or verbal cues   Transfers Chair/bed transfer   Chair/bed transfer method: Ambulatory Chair/bed transfer assist level: Supervision or verbal cues Chair/bed transfer assistive device: Armrests, Medical sales representative     Max distance: 123ft  Assist level: Supervision or Psychologist, clinical activity did not occur: N/A        Cognition Comprehension Comprehension assist level: Follows basic conversation/direction with no assist  Expression Expression assist level: Expresses basic needs/ideas: With no assist  Social Interaction Social Interaction assist level: Interacts appropriately 90% of the time - Needs monitoring or encouragement for participation or interaction.  Problem Solving Problem solving assist level: Solves basic 75 - 89% of the time/requires cueing 10 - 24% of the time  Memory Memory assist level: Recognizes or recalls 75 - 89% of the time/requires cueing 10 - 24% of the time   Medical Problem List and Plan: 1. 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  -ELOS 03/18/17 2. DVT Prophylaxis/Anticoagulation: SCDs. Patient is ambulatory 3. Pain Managementwith history of  bilateral total hip replacement and chronic back pain: Ultram as needed, may use for HA as well  -added kpad for back/hips 4. Mood: Wellbutrin 300 mg daily, hydroxyzine 25 mg daily at bedtime 5. Neuropsych: This patient iscapable of making decisions on herown behalf. 6. Skin/Wound Care: Routine skin checks 7. Fluids/Electrolytes/Nutrition: I personally reviewed the patient's labs today. 5/20  -BUN/Cr with elevation (has CKD however) Continue to hold lasix (20mg  daily)   8.Hypertension. Norvasc 10 mg daily, Lasix 20 mg daily (held), Cozaar 100 mg daily.  9.Gout. Allopurinol. Monitor for any gout flareups 10. Overactive bladder. Ditropan 5 mg daily.   11.Hyperlipidemia. Lipitor 12.GERD. Protonix 13.Constipation. Laxative assistance 14. ABLA: hgb 10.2 , no signs of blood loss, will repeat CBC prior to d/c 15. Leukocytosis: 11.7 today. I suspect this is steroid related  -completed keflex for UTI, Repeat UA - 16. Insomnia/sweating: improving  -weaning steroids    -keep room warm  -prn trazodone 17 Acute on Chronic kidney disease  -continue to hold lasix   -repeat BMET Stable, will cont to hold Lasix, can recheck 1-2 wks as outpt 18.  Vaginal bleeding one episode of spotting while wiping after urination, Hgb stable, exam unremarkable, has been on Estrace as outpt but none while inpt, will dose one applicator. Inaddition prone to yeast infx after abx, will give prophyllactic dose of 150mg  x 1 today, and rx for diflucan 100mg  x 7 d if she  develops infx later  LOS (Days) Stephen EVALUATION WAS PERFORMED  Charlett Blake, MD 03/18/2017 6:45 AM

## 2017-03-18 NOTE — Progress Notes (Signed)
Occupational Therapy Session Note  Patient Details  Name: [name] MRN: [id number] Date of Birth: [birth year]  Today's Date: 03/18/2017 OT Individual Time: 1000-1100 OT Individual Time Calculation (min): 60 min    Short Term Goals: Week 1:  OT Short Term Goal 1 (Week 1): Pt will complete stand pivot transfer to toilet with min A OT Short Term Goal 2 (Week 1): Pt will stand to complete grooming task with steadying assist to increase functional activity tolerance OT Short Term Goal 3 (Week 1): Pt will complete clothing management during toileting task with no more than mod steadying assist OT Short Term Goal 4 (Week 1): Pt will dress UB with set-up assist  Skilled Therapeutic Interventions/Progress Updates: ADL-retraining at shower level with focus on improved standing balance during BADL, transfers and attention.   Pt received seated in recliner and requesting assist to bathe, dress and groom in prep for imminent discharge.  Patient alerted OT to presence of blood from vaginal discharge as concern prior to engaging in BADL; OT confirmed with RN care provided for condition.   Pt then gathered her clothing and ambulated to shower to bathe, requesting assist as needed (only to wash her back thoroughly) although using LH sponge effectively to wash her feet.   Pt dressed seated at sink with setup and supervision to sequence and problem-solve.   Overall, pt required only supervisory assist to sustain attention to task, sequence and problem-solve preparations for discharge.   Pt dressed with only setup to apply TEDs at BLE.  No LOB noted during session.  Pt left with RN instructing pt on use of medication in bathroom to address presence of vaginal bleeding.       Therapy Documentation Precautions:  Precautions Precautions: Fall Restrictions Weight Bearing Restrictions: No   Vital Signs: Therapy Vitals BP: 129/79   Pain: No/denies pain    See Function Navigator for Current Functional  Status.   Therapy/Group: Individual Therapy  Gray 03/18/2017, 1:00 PM

## 2017-03-19 ENCOUNTER — Telehealth (HOSPITAL_COMMUNITY): Payer: Self-pay

## 2017-03-19 ENCOUNTER — Ambulatory Visit: Payer: PPO | Admitting: Physical Therapy

## 2017-03-19 LAB — URINE CULTURE

## 2017-03-19 NOTE — Progress Notes (Signed)
Received a call from pt's daughter, [name], today and requesting that we switch her follow therapies to be done via [facility] agency instead of Outpatient as transportation is a hardship for them.  Pt has had [place] in the past and daughter request this agency again.  Have made referral to [facility] for HHPT, OT and [place], Calogero Geisen, LCSW

## 2017-03-21 ENCOUNTER — Encounter: Payer: Self-pay | Admitting: *Deleted

## 2017-03-21 DIAGNOSIS — I129 Hypertensive chronic kidney disease with stage 1 through stage 4 chronic kidney disease, or unspecified chronic kidney disease: Secondary | ICD-10-CM | POA: Diagnosis not present

## 2017-03-21 DIAGNOSIS — S065X0D Traumatic subdural hemorrhage without loss of consciousness, subsequent encounter: Secondary | ICD-10-CM | POA: Diagnosis not present

## 2017-03-21 DIAGNOSIS — Z79891 Long term (current) use of opiate analgesic: Secondary | ICD-10-CM | POA: Diagnosis not present

## 2017-03-21 DIAGNOSIS — K2900 Acute gastritis without bleeding: Secondary | ICD-10-CM | POA: Diagnosis not present

## 2017-03-21 DIAGNOSIS — R739 Hyperglycemia, unspecified: Secondary | ICD-10-CM | POA: Diagnosis not present

## 2017-03-21 DIAGNOSIS — I872 Venous insufficiency (chronic) (peripheral): Secondary | ICD-10-CM | POA: Diagnosis not present

## 2017-03-21 DIAGNOSIS — Z96643 Presence of artificial hip joint, bilateral: Secondary | ICD-10-CM | POA: Diagnosis not present

## 2017-03-21 DIAGNOSIS — K219 Gastro-esophageal reflux disease without esophagitis: Secondary | ICD-10-CM | POA: Diagnosis not present

## 2017-03-21 DIAGNOSIS — D62 Acute posthemorrhagic anemia: Secondary | ICD-10-CM | POA: Diagnosis not present

## 2017-03-21 DIAGNOSIS — M109 Gout, unspecified: Secondary | ICD-10-CM | POA: Diagnosis not present

## 2017-03-21 DIAGNOSIS — W19XXXD Unspecified fall, subsequent encounter: Secondary | ICD-10-CM | POA: Diagnosis not present

## 2017-03-21 DIAGNOSIS — E785 Hyperlipidemia, unspecified: Secondary | ICD-10-CM | POA: Diagnosis not present

## 2017-03-21 DIAGNOSIS — T380X5D Adverse effect of glucocorticoids and synthetic analogues, subsequent encounter: Secondary | ICD-10-CM | POA: Diagnosis not present

## 2017-03-21 DIAGNOSIS — N183 Chronic kidney disease, stage 3 (moderate): Secondary | ICD-10-CM | POA: Diagnosis not present

## 2017-03-21 DIAGNOSIS — F329 Major depressive disorder, single episode, unspecified: Secondary | ICD-10-CM | POA: Diagnosis not present

## 2017-03-27 ENCOUNTER — Telehealth: Payer: Self-pay | Admitting: Internal Medicine

## 2017-03-27 NOTE — Telephone Encounter (Signed)
Daughter calls and states that pt was taken off lasix during rehab, when pt returned home daughter restarted lasix, PT told daughter today that she needed to call pcp and inform pcp of swelling, appt w/ dr Software engineer is scheduled 6/14, daughter is advised pt needs eval in clinic asap, appt [facility] thurs 5/31at 1545.

## 2017-03-27 NOTE — Telephone Encounter (Signed)
FEET AND LEGS SWOLLEN

## 2017-03-29 ENCOUNTER — Ambulatory Visit (INDEPENDENT_AMBULATORY_CARE_PROVIDER_SITE_OTHER): Payer: PPO | Admitting: Internal Medicine

## 2017-03-29 DIAGNOSIS — Z8679 Personal history of other diseases of the circulatory system: Secondary | ICD-10-CM

## 2017-03-29 DIAGNOSIS — Z9181 History of falling: Secondary | ICD-10-CM

## 2017-03-29 DIAGNOSIS — R600 Localized edema: Secondary | ICD-10-CM

## 2017-03-29 MED ORDER — FUROSEMIDE 40 MG PO TABS: 40.0000 mg | ORAL_TABLET | Freq: Every day | ORAL | 0 refills | Status: DC

## 2017-03-29 NOTE — Progress Notes (Signed)
   CC: Leg swelling  HPI:  [name] is a 76 y.o. female with past medical history outlined below here for leg swelling. For the details of today's visit, please refer to the assessment and plan.  Past Medical History:  Diagnosis Date  . Chronic venous insufficiency   . Colon, diverticulosis Sept. 2011   outpatient colonoscopy by Dr. Cristina Gong.  Need record  . DDD (degenerative disc disease), lumbosacral   . Depression   . DJD (degenerative joint disease), multiple sites    Low back pain worst  . GERD (gastroesophageal reflux disease)   . Gout   . Gout    Uric Acid level 4.2 on 300 allopurinol  . History of cervical cancer 1972  . Hx-TIA (transient ischemic attack) 05/13/2001   Right facial numbness  . Hyperlipidemia   . Hypertension   . Memory disorder 12/20/2016  . Mitral valve prolapse 10/27/1999   Subsequent 2D echo show normal mitral valve  . Osteoarthritis of hip    bilateral hips  . Ovarian cancer ([place]) 1972   S/P oophorectomy  . Psychosis   . Recurrent boils    History of MRSA skin infections with abscess  . Sensorineural hearing loss of both ears   . Subdural hematoma ([place]) 01/26/2017   bilateral    Review of Systems:  Denies chest pain and SOB.  Physical Exam:  Vitals:   03/29/17 1614  BP: 129/72  Pulse: 86  Temp: 98.7 F (37.1 C)  TempSrc: Oral  SpO2: 99%  Weight: 206 lb 11.2 oz (93.8 kg)    Constitutional: NAD, appears comfortable  Cardiovascular: RRR, no murmurs, rubs, or gallops.  Pulmonary/Chest: CTAB Abdominal: Soft, non tender, non distended. +BS.  Extremities: Warm and well perfused. 3+ pitting edema to her mid shins bilaterally with significant demarcation from her shoe and sock lines  Psych: Normal mood and affect  Assessment & Plan:   See Encounters Tab for problem based charting.  Patient discussed with Dr. Daryll Drown

## 2017-03-29 NOTE — Patient Instructions (Addendum)
Ms. Greeno,  It was a pleasure to see you. For your leg swelling, please increase your dose of lasix to 40 mg daily. You may take 2 tablets of your current prescription until you run out and then start your new prescription with the higher dose tablets. I will call you with the results of your blood work. Please keep your scheduled appointment with Dr. Lynnae January on the 14th for follow up. If you have any questions or concerns, call our clinic at [phone] or after hours call [phone] and ask for the internal medicine resident on call. Thank you!  - Dr. Philipp Ovens

## 2017-03-30 DIAGNOSIS — K219 Gastro-esophageal reflux disease without esophagitis: Secondary | ICD-10-CM | POA: Diagnosis not present

## 2017-03-30 DIAGNOSIS — Z96643 Presence of artificial hip joint, bilateral: Secondary | ICD-10-CM | POA: Diagnosis not present

## 2017-03-30 DIAGNOSIS — S065X0D Traumatic subdural hemorrhage without loss of consciousness, subsequent encounter: Secondary | ICD-10-CM | POA: Diagnosis not present

## 2017-03-30 DIAGNOSIS — Z79891 Long term (current) use of opiate analgesic: Secondary | ICD-10-CM | POA: Diagnosis not present

## 2017-03-30 DIAGNOSIS — E785 Hyperlipidemia, unspecified: Secondary | ICD-10-CM | POA: Diagnosis not present

## 2017-03-30 DIAGNOSIS — D62 Acute posthemorrhagic anemia: Secondary | ICD-10-CM | POA: Diagnosis not present

## 2017-03-30 DIAGNOSIS — W19XXXD Unspecified fall, subsequent encounter: Secondary | ICD-10-CM | POA: Diagnosis not present

## 2017-03-30 DIAGNOSIS — T380X5D Adverse effect of glucocorticoids and synthetic analogues, subsequent encounter: Secondary | ICD-10-CM | POA: Diagnosis not present

## 2017-03-30 DIAGNOSIS — I872 Venous insufficiency (chronic) (peripheral): Secondary | ICD-10-CM | POA: Diagnosis not present

## 2017-03-30 DIAGNOSIS — K2900 Acute gastritis without bleeding: Secondary | ICD-10-CM | POA: Diagnosis not present

## 2017-03-30 DIAGNOSIS — N183 Chronic kidney disease, stage 3 (moderate): Secondary | ICD-10-CM | POA: Diagnosis not present

## 2017-03-30 DIAGNOSIS — R739 Hyperglycemia, unspecified: Secondary | ICD-10-CM | POA: Diagnosis not present

## 2017-03-30 DIAGNOSIS — M109 Gout, unspecified: Secondary | ICD-10-CM | POA: Diagnosis not present

## 2017-03-30 DIAGNOSIS — F329 Major depressive disorder, single episode, unspecified: Secondary | ICD-10-CM | POA: Diagnosis not present

## 2017-03-30 DIAGNOSIS — I129 Hypertensive chronic kidney disease with stage 1 through stage 4 chronic kidney disease, or unspecified chronic kidney disease: Secondary | ICD-10-CM | POA: Diagnosis not present

## 2017-03-30 LAB — BMP8+ANION GAP
Anion Gap: 11 mmol/L (ref 10.0–18.0)
BUN/Creatinine Ratio: 11 — ABNORMAL LOW (ref 12–28)
BUN: 14 mg/dL (ref 8–27)
CO2: 26 mmol/L (ref 18–29)
Calcium: 9.3 mg/dL (ref 8.7–10.3)
Chloride: 106 mmol/L (ref 96–106)
Creatinine, Ser: 1.23 mg/dL — ABNORMAL HIGH (ref 0.57–1.00)
GFR calc Af Amer: 50 mL/min/{1.73_m2} — ABNORMAL LOW (ref >59)
GFR calc non Af Amer: 43 mL/min/{1.73_m2} — ABNORMAL LOW (ref >59)
Glucose: 119 mg/dL — ABNORMAL HIGH (ref 65–99)
Potassium: 4 mmol/L (ref 3.5–5.2)
Sodium: 143 mmol/L (ref 134–144)

## 2017-03-30 NOTE — Assessment & Plan Note (Addendum)
Patient has lower extremity edema likely multifactorial due to dCHF, CKD, and venous insufficiency. She was started on lasix 20 mg daily by her PCP with significant improvement in her swelling. Unfortunately, patient suffered a fall with traumatic subdural hematomas that required bilateral bur hole evacuation and decompression in April 2018. She was initially discharged home with home health services, but experienced recurrent falls and was readmitted to the hospital. She was then transferred to inpatient rehab and improved clinically. While she was inpatient rehab, her lasix was stopped for unclear reasons. Since her discharge from rehab on 03/16/17, daughter has noticed significant worsening of her lower extremity edema. She restarted her lasix on 03/24/27 (6 days ago). Swelling has improved slightly but persisted. On exam today, she has 3+ pitting edema to her mid shins bilaterally with large demarcations from her shoe and sock lines. Today we discussed increasing her lasix dose to 40 mg daily, wearing compression stockings (which patients already has at home but has not been wearing), and elevating her legs when she is resting. Daughter and patient are in agreement. Will check BMP today. Patient is scheduled to follow up with her PCP in 2 weeks.  -- Increase lasix to 40 mg daily  -- F/u BMP -- F/u PCP on 04/12/17  ADDENDUM: Renal function improved slightly since restarting lasix, creatinine 1.4 -> 1.2 and GFR 40 -> 50 from 1 week ago. Electrolytes stable. Called patient's daughter and updated her with results. Continue lasix 40 mg daily. Follow up with PCP.

## 2017-04-02 NOTE — Progress Notes (Signed)
Internal Medicine Clinic Attending  Case discussed with Dr. Guilloud at the time of the visit.  We reviewed the resident's history and exam and pertinent patient test results.  I agree with the assessment, diagnosis, and plan of care documented in the resident's note.  

## 2017-04-03 DIAGNOSIS — K2900 Acute gastritis without bleeding: Secondary | ICD-10-CM | POA: Diagnosis not present

## 2017-04-03 DIAGNOSIS — Z79891 Long term (current) use of opiate analgesic: Secondary | ICD-10-CM | POA: Diagnosis not present

## 2017-04-03 DIAGNOSIS — T380X5D Adverse effect of glucocorticoids and synthetic analogues, subsequent encounter: Secondary | ICD-10-CM | POA: Diagnosis not present

## 2017-04-03 DIAGNOSIS — W19XXXD Unspecified fall, subsequent encounter: Secondary | ICD-10-CM | POA: Diagnosis not present

## 2017-04-03 DIAGNOSIS — D62 Acute posthemorrhagic anemia: Secondary | ICD-10-CM | POA: Diagnosis not present

## 2017-04-03 DIAGNOSIS — E785 Hyperlipidemia, unspecified: Secondary | ICD-10-CM | POA: Diagnosis not present

## 2017-04-03 DIAGNOSIS — S065X0D Traumatic subdural hemorrhage without loss of consciousness, subsequent encounter: Secondary | ICD-10-CM | POA: Diagnosis not present

## 2017-04-03 DIAGNOSIS — M109 Gout, unspecified: Secondary | ICD-10-CM | POA: Diagnosis not present

## 2017-04-03 DIAGNOSIS — K219 Gastro-esophageal reflux disease without esophagitis: Secondary | ICD-10-CM | POA: Diagnosis not present

## 2017-04-03 DIAGNOSIS — Z96643 Presence of artificial hip joint, bilateral: Secondary | ICD-10-CM | POA: Diagnosis not present

## 2017-04-03 DIAGNOSIS — F329 Major depressive disorder, single episode, unspecified: Secondary | ICD-10-CM | POA: Diagnosis not present

## 2017-04-03 DIAGNOSIS — R739 Hyperglycemia, unspecified: Secondary | ICD-10-CM | POA: Diagnosis not present

## 2017-04-03 DIAGNOSIS — I129 Hypertensive chronic kidney disease with stage 1 through stage 4 chronic kidney disease, or unspecified chronic kidney disease: Secondary | ICD-10-CM | POA: Diagnosis not present

## 2017-04-03 DIAGNOSIS — I872 Venous insufficiency (chronic) (peripheral): Secondary | ICD-10-CM | POA: Diagnosis not present

## 2017-04-03 DIAGNOSIS — N183 Chronic kidney disease, stage 3 (moderate): Secondary | ICD-10-CM | POA: Diagnosis not present

## 2017-04-06 ENCOUNTER — Emergency Department (HOSPITAL_COMMUNITY): Payer: PPO

## 2017-04-06 ENCOUNTER — Inpatient Hospital Stay (HOSPITAL_COMMUNITY)
Admission: EM | Admit: 2017-04-06 | Discharge: 2017-04-08 | DRG: 690 | Disposition: A | Discharge status: Home / Self Care | Payer: PPO | Attending: Internal Medicine | Admitting: Internal Medicine

## 2017-04-06 ENCOUNTER — Encounter (HOSPITAL_COMMUNITY): Payer: Self-pay | Admitting: *Deleted

## 2017-04-06 ENCOUNTER — Telehealth: Payer: Self-pay | Admitting: *Deleted

## 2017-04-06 ENCOUNTER — Encounter: Payer: Self-pay | Admitting: Internal Medicine

## 2017-04-06 ENCOUNTER — Ambulatory Visit (INDEPENDENT_AMBULATORY_CARE_PROVIDER_SITE_OTHER): Payer: PPO | Admitting: Internal Medicine

## 2017-04-06 VITALS — BP 120/88 | HR 122 | Temp 103.2°F | Wt 206.6 lb

## 2017-04-06 DIAGNOSIS — R4182 Altered mental status, unspecified: Secondary | ICD-10-CM

## 2017-04-06 DIAGNOSIS — N3941 Urge incontinence: Secondary | ICD-10-CM | POA: Diagnosis present

## 2017-04-06 DIAGNOSIS — Z882 Allergy status to sulfonamides status: Secondary | ICD-10-CM

## 2017-04-06 DIAGNOSIS — G8929 Other chronic pain: Secondary | ICD-10-CM | POA: Diagnosis present

## 2017-04-06 DIAGNOSIS — N390 Urinary tract infection, site not specified: Secondary | ICD-10-CM | POA: Diagnosis not present

## 2017-04-06 DIAGNOSIS — Z79891 Long term (current) use of opiate analgesic: Secondary | ICD-10-CM

## 2017-04-06 DIAGNOSIS — B9689 Other specified bacterial agents as the cause of diseases classified elsewhere: Secondary | ICD-10-CM | POA: Diagnosis not present

## 2017-04-06 DIAGNOSIS — Z96643 Presence of artificial hip joint, bilateral: Secondary | ICD-10-CM | POA: Diagnosis present

## 2017-04-06 DIAGNOSIS — I10 Essential (primary) hypertension: Secondary | ICD-10-CM | POA: Diagnosis not present

## 2017-04-06 DIAGNOSIS — M25552 Pain in left hip: Secondary | ICD-10-CM | POA: Diagnosis present

## 2017-04-06 DIAGNOSIS — Z7952 Long term (current) use of systemic steroids: Secondary | ICD-10-CM | POA: Diagnosis not present

## 2017-04-06 DIAGNOSIS — Z87891 Personal history of nicotine dependence: Secondary | ICD-10-CM | POA: Diagnosis not present

## 2017-04-06 DIAGNOSIS — Z886 Allergy status to analgesic agent status: Secondary | ICD-10-CM

## 2017-04-06 DIAGNOSIS — Z8614 Personal history of Methicillin resistant Staphylococcus aureus infection: Secondary | ICD-10-CM | POA: Diagnosis not present

## 2017-04-06 DIAGNOSIS — R509 Fever, unspecified: Secondary | ICD-10-CM | POA: Diagnosis not present

## 2017-04-06 DIAGNOSIS — Z8673 Personal history of transient ischemic attack (TIA), and cerebral infarction without residual deficits: Secondary | ICD-10-CM

## 2017-04-06 DIAGNOSIS — Z90710 Acquired absence of both cervix and uterus: Secondary | ICD-10-CM

## 2017-04-06 DIAGNOSIS — E785 Hyperlipidemia, unspecified: Secondary | ICD-10-CM | POA: Diagnosis present

## 2017-04-06 DIAGNOSIS — M109 Gout, unspecified: Secondary | ICD-10-CM | POA: Diagnosis present

## 2017-04-06 DIAGNOSIS — M5137 Other intervertebral disc degeneration, lumbosacral region: Secondary | ICD-10-CM | POA: Diagnosis present

## 2017-04-06 DIAGNOSIS — E669 Obesity, unspecified: Secondary | ICD-10-CM | POA: Diagnosis not present

## 2017-04-06 DIAGNOSIS — F209 Schizophrenia, unspecified: Secondary | ICD-10-CM | POA: Diagnosis not present

## 2017-04-06 DIAGNOSIS — Z8541 Personal history of malignant neoplasm of cervix uteri: Secondary | ICD-10-CM | POA: Diagnosis not present

## 2017-04-06 DIAGNOSIS — Z8679 Personal history of other diseases of the circulatory system: Secondary | ICD-10-CM

## 2017-04-06 DIAGNOSIS — Z79899 Other long term (current) drug therapy: Secondary | ICD-10-CM

## 2017-04-06 DIAGNOSIS — Z8543 Personal history of malignant neoplasm of ovary: Secondary | ICD-10-CM

## 2017-04-06 DIAGNOSIS — F39 Unspecified mood [affective] disorder: Secondary | ICD-10-CM | POA: Diagnosis present

## 2017-04-06 DIAGNOSIS — M160 Bilateral primary osteoarthritis of hip: Secondary | ICD-10-CM | POA: Diagnosis present

## 2017-04-06 DIAGNOSIS — S065X9A Traumatic subdural hemorrhage with loss of consciousness of unspecified duration, initial encounter: Secondary | ICD-10-CM | POA: Diagnosis not present

## 2017-04-06 DIAGNOSIS — N3000 Acute cystitis without hematuria: Secondary | ICD-10-CM | POA: Diagnosis not present

## 2017-04-06 DIAGNOSIS — G4733 Obstructive sleep apnea (adult) (pediatric): Secondary | ICD-10-CM | POA: Diagnosis present

## 2017-04-06 DIAGNOSIS — R05 Cough: Secondary | ICD-10-CM | POA: Diagnosis not present

## 2017-04-06 LAB — COMPREHENSIVE METABOLIC PANEL
ALT: 38 U/L (ref 14–54)
AST: 31 U/L (ref 15–41)
Albumin: 3.4 g/dL — ABNORMAL LOW (ref 3.5–5.0)
Alkaline Phosphatase: 104 U/L (ref 38–126)
Anion gap: 11 (ref 5–15)
BUN: 17 mg/dL (ref 6–20)
CO2: 25 mmol/L (ref 22–32)
Calcium: 9.1 mg/dL (ref 8.9–10.3)
Chloride: 100 mmol/L — ABNORMAL LOW (ref 101–111)
Creatinine, Ser: 1.55 mg/dL — ABNORMAL HIGH (ref 0.44–1.00)
GFR calc Af Amer: 37 mL/min — ABNORMAL LOW (ref >60)
GFR calc non Af Amer: 32 mL/min — ABNORMAL LOW (ref >60)
Glucose, Bld: 113 mg/dL — ABNORMAL HIGH (ref 65–99)
Potassium: 3.8 mmol/L (ref 3.5–5.1)
Sodium: 136 mmol/L (ref 135–145)
Total Bilirubin: 0.6 mg/dL (ref 0.3–1.2)
Total Protein: 7.2 g/dL (ref 6.5–8.1)

## 2017-04-06 LAB — URINALYSIS, ROUTINE W REFLEX MICROSCOPIC
Bilirubin Urine: NEGATIVE
Bilirubin Urine: NEGATIVE
Glucose, UA: NEGATIVE mg/dL
Glucose, UA: NEGATIVE mg/dL
Hgb urine dipstick: NEGATIVE
Hgb urine dipstick: NEGATIVE
Ketones, ur: NEGATIVE mg/dL
Ketones, ur: NEGATIVE mg/dL
Nitrite: NEGATIVE
Nitrite: POSITIVE — AB
Protein, ur: NEGATIVE mg/dL
Protein, ur: NEGATIVE mg/dL
Specific Gravity, Urine: 1.006 (ref 1.005–1.030)
Specific Gravity, Urine: 1.01 (ref 1.005–1.030)
pH: 7 (ref 5.0–8.0)
pH: 5 (ref 5.0–8.0)

## 2017-04-06 LAB — POCT URINALYSIS DIPSTICK
Bilirubin, UA: NEGATIVE
Glucose, UA: NEGATIVE
Ketones, UA: NEGATIVE
Nitrite, UA: NEGATIVE
Protein, UA: NEGATIVE
Spec Grav, UA: 1.015 (ref 1.010–1.025)
Urobilinogen, UA: 0.2 E.U./dL
pH, UA: 7 (ref 5.0–8.0)

## 2017-04-06 LAB — CBC WITH DIFFERENTIAL/PLATELET
Basophils Absolute: 0 10*3/uL (ref 0.0–0.1)
Basophils Relative: 0 %
Eosinophils Absolute: 0.2 10*3/uL (ref 0.0–0.7)
Eosinophils Relative: 3 %
HCT: 35.8 % — ABNORMAL LOW (ref 36.0–46.0)
Hemoglobin: 11.5 g/dL — ABNORMAL LOW (ref 12.0–15.0)
Lymphocytes Relative: 23 %
Lymphs Abs: 1.6 10*3/uL (ref 0.7–4.0)
MCH: 30.4 pg (ref 26.0–34.0)
MCHC: 32.1 g/dL (ref 30.0–36.0)
MCV: 94.7 fL (ref 78.0–100.0)
Monocytes Absolute: 0.5 10*3/uL (ref 0.1–1.0)
Monocytes Relative: 7 %
Neutro Abs: 4.9 10*3/uL (ref 1.7–7.7)
Neutrophils Relative %: 67 %
Platelets: 248 10*3/uL (ref 150–400)
RBC: 3.78 MIL/uL — ABNORMAL LOW (ref 3.87–5.11)
RDW: 16.2 % — ABNORMAL HIGH (ref 11.5–15.5)
WBC: 7.3 10*3/uL (ref 4.0–10.5)

## 2017-04-06 LAB — I-STAT CG4 LACTIC ACID, ED
Lactic Acid, Venous: 1.45 mmol/L (ref 0.5–1.9)
Lactic Acid, Venous: 1.17 mmol/L (ref 0.5–1.9)

## 2017-04-06 LAB — PROTIME-INR
INR: 1.06
Prothrombin Time: 13.9 seconds (ref 11.4–15.2)

## 2017-04-06 MED ORDER — DEXTROSE 5 % IV SOLN
1.0000 g | INTRAVENOUS | Status: DC
  Administered 2017-04-07: 1 g via INTRAVENOUS
  Filled 2017-04-06: qty 10

## 2017-04-06 MED ORDER — ENOXAPARIN SODIUM 40 MG/0.4ML ~~LOC~~ SOLN: 40.0000 mg | SUBCUTANEOUS | Status: DC

## 2017-04-06 MED ORDER — SODIUM CHLORIDE 0.9% FLUSH
3.0000 mL | Freq: Two times a day (BID) | INTRAVENOUS | Status: DC
Start: 2017-04-06 — End: 2017-04-08
  Administered 2017-04-06 – 2017-04-07 (×2): 3 mL via INTRAVENOUS

## 2017-04-06 MED ORDER — PANTOPRAZOLE SODIUM 20 MG PO TBEC
20.0000 mg | DELAYED_RELEASE_TABLET | Freq: Two times a day (BID) | ORAL | Status: DC
  Administered 2017-04-06 – 2017-04-08 (×4): 20 mg via ORAL
  Filled 2017-04-06 (×4): qty 1

## 2017-04-06 MED ORDER — TRAMADOL HCL 50 MG PO TABS
50.0000 mg | ORAL_TABLET | Freq: Two times a day (BID) | ORAL | Status: DC | PRN
  Administered 2017-04-07: 50 mg via ORAL
  Filled 2017-04-06: qty 1

## 2017-04-06 MED ORDER — SODIUM CHLORIDE 0.9% FLUSH: 3.0000 mL | INTRAVENOUS | Status: DC | PRN

## 2017-04-06 MED ORDER — ACETAMINOPHEN 650 MG RE SUPP: 650.0000 mg | Freq: Four times a day (QID) | RECTAL | Status: DC | PRN

## 2017-04-06 MED ORDER — ACETAMINOPHEN 325 MG PO TABS
650.0000 mg | ORAL_TABLET | Freq: Once | ORAL | Status: AC
  Administered 2017-04-06: 650 mg via ORAL

## 2017-04-06 MED ORDER — ALLOPURINOL 300 MG PO TABS
300.0000 mg | ORAL_TABLET | Freq: Every day | ORAL | Status: DC
  Administered 2017-04-07 – 2017-04-08 (×2): 300 mg via ORAL
  Filled 2017-04-06 (×2): qty 1

## 2017-04-06 MED ORDER — SENNOSIDES-DOCUSATE SODIUM 8.6-50 MG PO TABS: 1.0000 | ORAL_TABLET | Freq: Every evening | ORAL | Status: DC | PRN

## 2017-04-06 MED ORDER — OXYBUTYNIN CHLORIDE ER 5 MG PO TB24
5.0000 mg | ORAL_TABLET | Freq: Every day | ORAL | Status: DC
  Administered 2017-04-06 – 2017-04-07 (×2): 5 mg via ORAL
  Filled 2017-04-06 (×3): qty 1

## 2017-04-06 MED ORDER — SODIUM CHLORIDE 0.9 % IV SOLN: 250.0000 mL | INTRAVENOUS | Status: DC | PRN

## 2017-04-06 MED ORDER — ATORVASTATIN CALCIUM 40 MG PO TABS
40.0000 mg | ORAL_TABLET | Freq: Every day | ORAL | Status: DC
  Administered 2017-04-07: 40 mg via ORAL
  Filled 2017-04-06: qty 1

## 2017-04-06 MED ORDER — ACETAMINOPHEN 325 MG PO TABS
ORAL_TABLET | ORAL | Status: AC
  Filled 2017-04-06: qty 2

## 2017-04-06 MED ORDER — AMLODIPINE BESYLATE 5 MG PO TABS
10.0000 mg | ORAL_TABLET | Freq: Every day | ORAL | Status: DC
  Administered 2017-04-07 – 2017-04-08 (×2): 10 mg via ORAL
  Filled 2017-04-06 (×2): qty 2

## 2017-04-06 MED ORDER — ACETAMINOPHEN 325 MG PO TABS
650.0000 mg | ORAL_TABLET | Freq: Four times a day (QID) | ORAL | Status: DC | PRN
  Administered 2017-04-06 – 2017-04-07 (×2): 650 mg via ORAL
  Filled 2017-04-06 (×2): qty 2

## 2017-04-06 MED ORDER — BUPROPION HCL ER (XL) 150 MG PO TB24
300.0000 mg | ORAL_TABLET | Freq: Every morning | ORAL | Status: DC
  Administered 2017-04-07 – 2017-04-08 (×2): 300 mg via ORAL
  Filled 2017-04-06 (×2): qty 2

## 2017-04-06 MED ORDER — CEFTRIAXONE SODIUM 1 G IJ SOLR
1.0000 g | Freq: Once | INTRAMUSCULAR | Status: AC
  Administered 2017-04-06: 1 g via INTRAVENOUS
  Filled 2017-04-06: qty 10

## 2017-04-06 MED ORDER — ALPRAZOLAM 0.25 MG PO TABS: 0.2500 mg | ORAL_TABLET | Freq: Every day | ORAL | Status: DC | PRN

## 2017-04-06 NOTE — ED Notes (Signed)
Attempted to call report

## 2017-04-06 NOTE — Telephone Encounter (Signed)
Can she come in for an appointment today?

## 2017-04-06 NOTE — ED Triage Notes (Signed)
Pt in from Internal medicine, per Bonnita Nasuti RN at internal medicine PT from home called today d/t worsening confusion and fever of 102.5 & was brought in with temp 103.2, pt  Had UA completed today with unknown source for infection, pt reported to have baseline confusion

## 2017-04-06 NOTE — H&P (Signed)
Date: 04/06/2017               Patient Name:  [name] MRN: [id number]  DOB: 1941/05/28 Age / Sex: 76 y.o., female   PCP: Bartholomew Crews, MD         Medical Service: Internal Medicine Teaching Service         Attending Physician: Dr. Aldine Contes    First Contact: Dr. Alphonzo Grieve Pager: [phone]  Second Contact: Dr. Jule Ser Pager: [phone]       After Hours (After 5p/  First Contact Pager: [phone]  weekends / holidays): Second Contact Pager: [phone]   Chief Complaint: Fever and confusion  History of Present Illness: [name] is a 76 y.o. woman with PMH schizophrenia, OSA , HTN, TIA, recent SDH s/p craniotomy via Dr. [name] in April 2018, and a recent rehab admission for falls who presents from clinic for evaluation of fever and confusion at home. History primarily gathered from daughter at bedside, who reports that her mother has been confused and disorganized over the last 2-3 days. She has been forgetful, confused conversationally, and leaving her house in [place]. The patient noticed urinary frequency and foul-smelling urine earlier this week, but denies dysuria. She had a recent UTI during her rehab stay last month. They had not noticed any fevers but today her home health SLP checked vitals and found her to be 102.5. She came in to IM clinic for an urgent visit and ultimately went to the ED given her recent complicated SDH history. She denies abdominal pain, changes in her chronic cough, nausea/vomiting, diarrhea, headache, neck pain/stiffness, falls, or myalgias. She reports some pain with left hip flexion but attributes this to home health PT sessions.  In the ED she was febrile to 103.2, HR 116, RR 18, BP 131/64, Spo2 96% on room air. Labs were remarkable for UA with pyuria, nitrite positive, few bacteria, and 6-30 squamous cells. BMP significant for Cr 1.55 from baseline 1.2, CBC unremarkable. CXR normal. CT head showed slightly smaller  bilateral SDHs but appear more hyperdense. IMTS contacted for admission.   Meds:  Current Meds  Medication Sig  . allopurinol (ZYLOPRIM) 300 MG tablet Take 1 tablet (300 mg total) by mouth daily.  [name] ALPRAZolam (XANAX) 0.25 MG tablet Take 1 tablet (0.25 mg total) by mouth 3 (three) times daily as needed for anxiety.  [name] amLODipine (NORVASC) 10 MG tablet Take 1 tablet (10 mg total) by mouth daily.  [name] ascorbic acid (VITAMIN C) 500 MG tablet Take 500 mg by mouth daily.  [name] atorvastatin (LIPITOR) 40 MG tablet Take 1 tablet (40 mg total) by mouth daily.  [name] buPROPion (WELLBUTRIN XL) 300 MG 24 hr tablet Take 1 tablet (300 mg total) by mouth every morning.  . fluocinonide cream (LIDEX) 0.05 % APPLY TO AFFECTED AREA TWICE DAILY  . furosemide (LASIX) 40 MG tablet Take 1 tablet (40 mg total) by mouth daily.    . hydrOXYzine (ATARAX/VISTARIL) 25 MG tablet Take 1 tablet (25 mg total) by mouth at bedtime.  [name] losartan (COZAAR) 100 MG tablet Take 1 tablet (100 mg total) by mouth daily.  . Multiple Vitamins-Minerals (ALIVE WOMENS ENERGY) TABS Take 1 tablet by mouth daily.   [name] oxybutynin (DITROPAN-XL) 5 MG 24 hr tablet Take 1 tablet (5 mg total) by mouth at bedtime.  . pantoprazole (PROTONIX) 20 MG tablet Take 1 tablet (20 mg total) by mouth 2 (two) times daily.  . potassium chloride (  MICRO-K) 10 MEQ CR capsule Take 1 capsule (10 mEq total) by mouth daily.  . traMADol (ULTRAM) 50 MG tablet Take 1 tablet (50 mg total) by mouth 2 (two) times daily as needed.   Allergies: Allergies as of 04/06/2017 - Review Complete 04/06/2017  Allergen Reaction Noted  . Aspirin Other (See Comments)   . Sulfamethoxazole-trimethoprim Itching    Past Medical History:  Diagnosis Date  . Chronic venous insufficiency   . Colon, diverticulosis Sept. 2011   outpatient colonoscopy by Dr. Cristina Gong.  Need record  . DDD (degenerative disc disease), lumbosacral   . Depression   . DJD (degenerative joint disease), multiple sites     Low back pain worst  . GERD (gastroesophageal reflux disease)   . Gout   . Gout    Uric Acid level 4.2 on 300 allopurinol  . History of cervical cancer 1972  . Hx-TIA (transient ischemic attack) 05/13/2001   Right facial numbness  . Hyperlipidemia   . Hypertension   . Memory disorder 12/20/2016  . Mitral valve prolapse 10/27/1999   Subsequent 2D echo show normal mitral valve  . Osteoarthritis of hip    bilateral hips  . Ovarian cancer ([place]) 1972   S/P oophorectomy  . Psychosis   . Recurrent boils    History of MRSA skin infections with abscess  . Sensorineural hearing loss of both ears   . Subdural hematoma ([place]) 01/26/2017   bilateral   Family History:  Family History  Problem Relation Age of Onset  . Diabetes Mother   . Heart disease Mother   . Hearing loss Mother   . Stroke Mother   . Post-traumatic stress disorder Father   . Schizophrenia Maternal Grandmother   . Diabetes Maternal Grandfather   . Other Brother        brother died in mental health hospital   Social History:  Social History   Social History  . Marital status: Widowed    Spouse name: N/A  . Number of children: 3  . Years of education: 15   Occupational History  . Retired    Social History Main Topics  . Smoking status: Former Smoker    Packs/day: 0.10    Types: Cigarettes    Quit date: 04/08/2013  . Smokeless tobacco: Never Used  . Alcohol use No  . Drug use: No  . Sexual activity: No   Other Topics Concern  . Not on file   Social History Narrative   Lives with daughter in [place]. Daughter is primary caretaker, surrogate decision-maker, and arranges for [facility] aide and other caregivers to supervise Ms. Patlan at home. Ambulatory    Review of Systems: A complete ROS was negative except as per HPI.   Physical Exam: Blood pressure (!) 128/93, pulse 100, temperature (!) 101.9 F (38.8 C), temperature source Oral, resp. rate (!) 22, height 5\' 4"  (1.626 m), weight 206 lb 9.6 oz (93.7  kg), SpO2 98 %.  General appearance: Obese elderly woman resting comfortably in bed, in no distress, conversational, poor hearing, mild confusion HENT: Normocephalic, atraumatic, moist mucous membranes Eyes: L pupillary defect, otherwise reactive/equal, non-icteric Cardiovascular: Distant regular heart sounds, no murmurs, rubs, gallops Respiratory: Clear to auscultation bilaterally, normal work of breathing Abdomen: Obese, BS+, soft, non-tender, non-distended Extremities: Normal bulk and range of motion, trace/1+ pitting edema to knees bilaterally, 2+ peripheral pulses, pain with active left hip flexion Skin: Warm, dry, normal skin turgor Neuro: Alert and oriented x3 Psych: Pleasant affect, clear speech,  mild confusion   Assessment & Plan by Problem: Active Problems:   UTI (urinary tract infection)  Urinary tract infection, presenting with several days of confusion/delirium, urinary frequency, malodorous urine, and high fevers at least today. Mental status improved this evening once fever came down. Recent E. Coli UTI last month. UA in clinic today was clean, UA collected in ED was nitrite positive, TNTC leuks, and few bacteria, squamous cells. Exam and labs otherwise largely benign. CT head at baseline, no pneumonia on CXR. -- IV Ceftriaxone  -- Follow urine and blood cultures -- Tylenol PRN for fever or pain  -- Trend CBC  MSK pain, mild left hip flexor pain with active movement, likely muscle strain from recent [facility] PT sessions -- Tramadol 50 mg Q12H PRN or Tylenol PRN  Urge incontinence -- home Oxybutynin 24 hr 5 mg QHS, taken for years  Schizophrenia / mood disorder (?)  -- home Alprazolam 0.25 po QD PRN -- home Bupropion 24hr 300 mg daily  GERD -- home Protonix 20 mg BID  HTN -- home Amlodipine 10 mg QD  HLD -- home Atorvastatin 40 mg QD  History of gout -- home Allopurinol 300 mg QD  FEN/GI: Regular diet, replete electrolytes as needed  DVT ppx: SCDs  Code status:  Full code  Dispo: Admit patient to Observation with expected length of stay less than 2 midnights.  Signed: Asencion Partridge, MD 04/06/2017, 8:28 PM  Pager: [phone]

## 2017-04-06 NOTE — Telephone Encounter (Signed)
Placed her in 1545 appt for today, awaiting for daughter to call back and confirm, if family cannot get her to appt will ask them to take her to urg care or ED

## 2017-04-06 NOTE — ED Notes (Signed)
Dr. Thomasene Lot notified on pt.'s persistent fever.

## 2017-04-06 NOTE — ED Provider Notes (Signed)
[facility] DEPT Provider Note   CSN: [id number] Arrival date & time: 04/06/17  1724     History   Chief Complaint Chief Complaint  Patient presents with  . Fever    HPI [name] is a 76 y.o. female.  The history is provided by the patient and a relative.  Fever   This is a new problem. The current episode started 6 to 12 hours ago. The problem occurs constantly. The problem has not changed since onset.The maximum temperature noted was 103 to 104 F. The temperature was taken using an oral thermometer. Pertinent negatives include no chest pain, no sleepiness, no diarrhea, no vomiting, no congestion, no headaches, no muscle aches and no cough. Associated symptoms comments: Patient with increased confusion over the past 2 days. Patient with foul-smelling urine noted by her daughter.. She has tried nothing for the symptoms.    Past Medical History:  Diagnosis Date  . Chronic venous insufficiency   . Colon, diverticulosis Sept. 2011   outpatient colonoscopy by Dr. Cristina Gong.  Need record  . DDD (degenerative disc disease), lumbosacral   . Depression   . DJD (degenerative joint disease), multiple sites    Low back pain worst  . GERD (gastroesophageal reflux disease)   . Gout   . Gout    Uric Acid level 4.2 on 300 allopurinol  . History of cervical cancer 1972  . Hx-TIA (transient ischemic attack) 05/13/2001   Right facial numbness  . Hyperlipidemia   . Hypertension   . Memory disorder 12/20/2016  . Mitral valve prolapse 10/27/1999   Subsequent 2D echo show normal mitral valve  . Osteoarthritis of hip    bilateral hips  . Ovarian cancer ([place]) 1972   S/P oophorectomy  . Psychosis   . Recurrent boils    History of MRSA skin infections with abscess  . Sensorineural hearing loss of both ears   . Subdural hematoma ([place]) 01/26/2017   bilateral    Patient Active Problem List   Diagnosis Date Noted  . Fever 04/06/2017  . UTI (urinary tract infection) 04/06/2017  .  Lower extremity edema 03/29/2017  . Subdural hemorrhage following injury without open intracranial wound and with loss of consciousness of 30 minutes or less, sequela ([place]) 03/08/2017  . Fall   . TBI (traumatic brain injury) ([place])   . History of bilateral hip replacements   . Steroid-induced hyperglycemia   . Acute blood loss anemia   . Elevated blood pressure reading   . Stage 3 chronic kidney disease   . Subdural hematoma ([place]) 03/06/2017  . SDH (subdural hematoma) ([place]) 02/10/2017  . Subdural hematoma without coma ([place]) 02/09/2017  . Acute low back pain 02/04/2017  . Memory disorder 12/20/2016  . History of lumbar laminectomy for spinal cord decompression 02/02/2016  . Atopic dermatitis 07/09/2015  . Bereavement 12/01/2014  . Psychosis 12/01/2014  . Reactive airway disease 08/11/2014  . History of cancer 08/11/2014  . OSA (obstructive sleep apnea) 08/11/2014  . Healthcare maintenance 07/17/2013  . Chronic kidney disease (CKD), stage III (moderate) 10/04/2012  . Schizophrenia, chronic condition ([place]) 01/20/2012  . Postmenopausal atrophic vaginitis 11/01/2010  . Chronic diastolic heart failure ([place]) 05/06/2010  . Allergic rhinitis 10/14/2009  . Obesity, Class II, BMI 35-39.9, with comorbidity 05/14/2009  . Chronic prescription opiate use 04/29/2008  . Normocytic anemia 09/25/2007  . Hyperlipidemia 09/03/2006  . Gout 09/03/2006  . Essential hypertension 09/03/2006  . GERD 09/03/2006  . Hx-TIA (transient ischemic attack) 05/13/2001  Past Surgical History:  Procedure Laterality Date  . ABDOMINAL HYSTERECTOMY     1972  . BURR HOLE Bilateral 02/12/2017   Procedure: Haskell Flirt;  Surgeon: Earnie Larsson, MD;  Location: [facility];  Service: Neurosurgery;  Laterality: Bilateral;  . CERVICAL DISCECTOMY  7/07   C5-C6  . JOINT REPLACEMENT     bilateral hip replacement  . LUMBAR DISC SURGERY     L5-S1 7/07  . LUMBAR LAMINECTOMY/DECOMPRESSION MICRODISCECTOMY N/A 02/02/2016   Procedure:  L4-5 Decompression;  Surgeon: Marybelle Killings, MD;  Location: [facility];  Service: Orthopedics;  Laterality: N/A;  . [place] for ovarian cancer    OB History    No data available       Home Medications    Prior to Admission medications   Medication Sig Start Date End Date Taking? Authorizing Provider  allopurinol (ZYLOPRIM) 300 MG tablet Take 1 tablet (300 mg total) by mouth daily. 03/16/17  Yes Angiulli, Lavon Paganini, PA-C  ALPRAZolam Duanne Moron) 0.25 MG tablet Take 1 tablet (0.25 mg total) by mouth 3 (three) times daily as needed for anxiety. 03/16/17  Yes Angiulli, Lavon Paganini, PA-C  amLODipine (NORVASC) 10 MG tablet Take 1 tablet (10 mg total) by mouth daily. 03/16/17  Yes Angiulli, Lavon Paganini, PA-C  ascorbic acid (VITAMIN C) 500 MG tablet Take 500 mg by mouth daily.   Yes [provider]  atorvastatin (LIPITOR) 40 MG tablet Take 1 tablet (40 mg total) by mouth daily. 03/16/17  Yes Angiulli, Lavon Paganini, PA-C  buPROPion (WELLBUTRIN XL) 300 MG 24 hr tablet Take 1 tablet (300 mg total) by mouth every morning. 03/16/17  Yes Angiulli, Lavon Paganini, PA-C  fluocinonide cream (LIDEX) 0.05 % APPLY TO AFFECTED AREA TWICE DAILY 11/02/16  Yes Bartholomew Crews, MD  furosemide (LASIX) 40 MG tablet Take 1 tablet (40 mg total) by mouth daily. 03/29/17  Yes Velna Ochs, MD  HYDROcodone-acetaminophen (NORCO/VICODIN) 5-325 MG tablet  03/18/17  Yes [provider]  hydrOXYzine (ATARAX/VISTARIL) 25 MG tablet Take 1 tablet (25 mg total) by mouth at bedtime. 03/16/17  Yes Angiulli, Lavon Paganini, PA-C  losartan (COZAAR) 100 MG tablet Take 1 tablet (100 mg total) by mouth daily. 03/16/17  Yes Angiulli, Lavon Paganini, PA-C  Multiple Vitamins-Minerals (ALIVE WOMENS ENERGY) TABS Take 1 tablet by mouth daily.    Yes [provider]  oxybutynin (DITROPAN-XL) 5 MG 24 hr tablet Take 1 tablet (5 mg total) by mouth at bedtime. 03/16/17  Yes Angiulli, Lavon Paganini, PA-C  pantoprazole (PROTONIX) 20 MG tablet Take 1  tablet (20 mg total) by mouth 2 (two) times daily. 03/16/17  Yes Angiulli, Lavon Paganini, PA-C  potassium chloride (MICRO-K) 10 MEQ CR capsule Take 1 capsule (10 mEq total) by mouth daily. 03/16/17  Yes Angiulli, Lavon Paganini, PA-C  traMADol (ULTRAM) 50 MG tablet Take 1 tablet (50 mg total) by mouth 2 (two) times daily as needed. 03/16/17  Yes Angiulli, Lavon Paganini, PA-C  docusate sodium (COLACE) 100 MG capsule Take 1 capsule (100 mg total) by mouth every 12 (twelve) hours. Patient not taking: Reported on 03/06/2017 12/21/13   Elnora Morrison, MD  fluticasone [facility]) 50 MCG/ACT nasal spray INSTILL 2 SPRAYS IN [facility] NOSTRIL DAILY Patient not taking: Reported on 04/06/2017 02/28/17   Bartholomew Crews, MD    Family History Family History  Problem Relation Age of Onset  . Diabetes Mother   . Heart disease Mother   . Hearing loss Mother   .  Stroke Mother   . Post-traumatic stress disorder Father   . Schizophrenia Maternal Grandmother   . Diabetes Maternal Grandfather   . Other Brother        brother died in mental health hospital    Social History Social History  Substance Use Topics  . Smoking status: Former Smoker    Packs/day: 0.10    Types: Cigarettes    Quit date: 04/08/2013  . Smokeless tobacco: Never Used  . Alcohol use No     Allergies   Aspirin and Sulfamethoxazole-trimethoprim   Review of Systems Review of Systems  Constitutional: Positive for fever. Negative for appetite change.  HENT: Negative for congestion.   Eyes: Negative for visual disturbance.  Respiratory: Negative for cough, chest tightness and shortness of breath.   Cardiovascular: Negative for chest pain.  Gastrointestinal: Negative for abdominal pain, diarrhea and vomiting.  Genitourinary: Negative for decreased urine volume, dysuria, flank pain and hematuria.  Musculoskeletal: Negative for back pain.  Skin: Negative for rash.  Neurological: Negative for dizziness, seizures, syncope, weakness, light-headedness and  headaches.  Psychiatric/Behavioral: Positive for confusion.     Physical Exam Updated Vital Signs BP 97/68 (BP Location: Left Arm)   Pulse 93   Temp (!) 101 F (38.3 C) (Rectal)   Resp 18   Ht 5\' 4"  (1.626 m)   Wt 93.7 kg (206 lb 9.6 oz)   SpO2 98%   BMI 35.46 kg/m   Physical Exam  Constitutional: She is oriented to person, place, and time. She appears well-developed and well-nourished. No distress.  HENT:  Head: Atraumatic.  Mouth/Throat: Oropharynx is clear and moist.  Eyes: Conjunctivae and EOM are normal.  Neck: Normal range of motion. Neck supple.  Cardiovascular: Normal rate, regular rhythm, normal heart sounds and intact distal pulses.   Pulmonary/Chest: Effort normal and breath sounds normal. No respiratory distress.  Abdominal: She exhibits no distension and no mass. There is no tenderness. There is no guarding.  Musculoskeletal: Normal range of motion. She exhibits no edema.  Neurological: She is alert and oriented to person, place, and time. She has normal strength. No cranial nerve deficit or sensory deficit. GCS eye subscore is 4. GCS verbal subscore is 4. GCS motor subscore is 6.  Skin: Skin is warm. Capillary refill takes less than 2 seconds. No pallor.  Psychiatric: She has a normal mood and affect.     ED Treatments / Results  Labs (all labs ordered are listed, but only abnormal results are displayed) Labs Reviewed  COMPREHENSIVE METABOLIC PANEL - Abnormal; Notable for the following:       Result Value   Chloride 100 (*)    Glucose, Bld 113 (*)    Creatinine, Ser 1.55 (*)    Albumin 3.4 (*)    GFR calc non Af Amer 32 (*)    GFR calc Af Amer 37 (*)    All other components within normal limits  CBC WITH DIFFERENTIAL/PLATELET - Abnormal; Notable for the following:    RBC 3.78 (*)    Hemoglobin 11.5 (*)    HCT 35.8 (*)    RDW 16.2 (*)    All other components within normal limits  URINALYSIS, ROUTINE W REFLEX MICROSCOPIC - Abnormal; Notable for the  following:    APPearance HAZY (*)    Nitrite POSITIVE (*)    Leukocytes, UA LARGE (*)    Bacteria, UA RARE (*)    Squamous Epithelial / LPF 6-30 (*)    All other components within normal  limits  CULTURE, BLOOD (ROUTINE X 2)  CULTURE, BLOOD (ROUTINE X 2)  PROTIME-INR  BASIC METABOLIC PANEL  CBC  I-STAT CG4 LACTIC ACID, ED  I-STAT CG4 LACTIC ACID, ED    EKG  EKG Interpretation None       Radiology Dg Chest 2 View  Result Date: 04/06/2017 CLINICAL DATA:  Cough for 1 month with fever. EXAM: CHEST  2 VIEW COMPARISON:  01/25/2016 and prior chest radiographs FINDINGS: The cardiomediastinal silhouette is unchanged with tortuous/ectatic thoracic aorta. Mild peribronchial thickening is unchanged. There is no evidence of focal airspace disease, pulmonary edema, suspicious pulmonary nodule/mass, pleural effusion, or pneumothorax. No acute bony abnormalities are identified. IMPRESSION: No active cardiopulmonary disease. Electronically Signed   By: Margarette Canada M.D.   On: 04/06/2017 18:14   Ct Head Wo Contrast  Result Date: 04/06/2017 CLINICAL DATA:  Progressive confusion and fever. Subdural hematomas. EXAM: CT HEAD WITHOUT CONTRAST TECHNIQUE: Contiguous axial images were obtained from the base of the skull through the vertex without intravenous contrast. COMPARISON:  CT of the head 03/06/2017 and 03/01/2017. FINDINGS: Brain: Bilateral extra-axial fluid collections persist. These are smaller than on the prior exam. There are layering densities compatible with chronic subdural hematomas. Acute exacerbation is not excluded. This results in bilateral mass effect. There is minimal rightward midline shift at the foramen of [place]ro. The basal ganglia and insular ribbon are intact. No focal cortical abnormality is evident. The brainstem and cerebellum are normal. Vascular: Atherosclerotic calcifications are present within the cavernous internal carotid arteries bilaterally. There is no hyperdense vessel. Skull:  Bilateral burr holes are present. The calvarium is otherwise intact. No focal lytic or blastic lesions are present. Sinuses/Orbits: The paranasal sinuses and mastoid air cells are clear. A left lens replacement is present. The globes and orbits are otherwise within normal limits. Other: IMPRESSION: 1. Bilateral extra-axial fluid collections persist. 2. Although the collections are smaller than on the prior study, there is somewhat hyperdense. Acute on chronic hemorrhage is not excluded. 3. Bilateral mass effect with minimal rightward midline shift. 4. No acute parenchymal abnormality. Electronically Signed   By: San Morelle M.D.   On: 04/06/2017 19:29    Procedures Procedures (including critical care time)  Medications Ordered in ED Medications  allopurinol (ZYLOPRIM) tablet 300 mg (not administered)  ALPRAZolam (XANAX) tablet 0.25 mg (not administered)  amLODipine (NORVASC) tablet 10 mg (not administered)  atorvastatin (LIPITOR) tablet 40 mg (not administered)  buPROPion (WELLBUTRIN XL) 24 hr tablet 300 mg (not administered)  oxybutynin (DITROPAN-XL) 24 hr tablet 5 mg (5 mg Oral Given 04/06/17 2339)  pantoprazole (PROTONIX) EC tablet 20 mg (20 mg Oral Given 04/06/17 2340)  traMADol (ULTRAM) tablet 50 mg (not administered)  enoxaparin (LOVENOX) injection 40 mg (not administered)  sodium chloride flush (NS) 0.9 % injection 3 mL (not administered)  sodium chloride flush (NS) 0.9 % injection 3 mL (not administered)  0.9 %  sodium chloride infusion (not administered)  acetaminophen (TYLENOL) tablet 650 mg (not administered)    Or  acetaminophen (TYLENOL) suppository 650 mg (not administered)  senna-docusate (Senokot-S) tablet 1 tablet (not administered)  cefTRIAXone (ROCEPHIN) 1 g in dextrose 5 % 50 mL IVPB (not administered)  acetaminophen (TYLENOL) tablet 650 mg (650 mg Oral Given 04/06/17 1738)  cefTRIAXone (ROCEPHIN) 1 g in dextrose 5 % 50 mL IVPB (0 g Intravenous Stopped 04/06/17 2114)      Initial Impression / Assessment and Plan / ED Course  I have reviewed the triage vital signs and  the nursing notes.  Pertinent labs & imaging results that were available during my care of the patient were reviewed by me and considered in my medical decision making (see chart for details).    76 year old female with past medical history significant for CAD, recent subdural hematomas from a fall requiring evacuation, who presents to the emergency department with altered mental status for 2 days associated with fever. Sent from primary care physician. Temperature 103.5 on arrival. Foul-smelling urine at home. Patient has no focal neurologic deficits. No meningeal signs. Initially tachycardic, 116, hemodynamically stable.  Differential diagnosis includes bacteremia, UTI, pneumonia, viral infection, meningitis. Blood cultures and urine cultures obtained. Lab work remarkable for no leukocytosis. No significant electrolyte abnormalities. Creatinine at baseline. Lactic acid 1.45. UA concerning for UTI with + nitrite, leukocytes, bacteria. CT head showed improvement of subdural hematoma, concern for questionable acute on chronic subdural.  Patient most likely with a urinary tract infection as etiology of her altered mental status and fever. Less likely meningitis given no meningeal signs and another source of the fever. Patient no focal neurologic findings. Discussed acute on chronic subdural hematomas with with neurosurgery, he did not think this was acute bleeding, improved from prior CT scan..  On reevaluation patient had improvement of her tachycardia following Tylenol and resolution of the fever. The patient does not appear septic. Given IV antibiotics. Mental status improved with improvement of temperature.  Patient admitted to internal medicine for further management. Patient stable for the floor.   Final Clinical Impressions(s) / ED Diagnoses   Final diagnoses:  Fever, unspecified fever  cause  Acute cystitis without hematuria  Altered mental status, unspecified altered mental status type    New Prescriptions Current Discharge Medication List       Nathaniel Man, MD 04/06/17 2343    Macarthur Critchley, MD 04/12/17 [phone]

## 2017-04-06 NOTE — Assessment & Plan Note (Addendum)
Fever and altered mental status of uncertain etiology in a geriatric patient with multiple comorbidities.   Her point-of-care urine dipstick is not grossly infected, but she may have urinary tract infection, less likely pneumonia.  She had recent subdural hematoma with evacuation 2 months ago, could have another subdural or infection. She is hemodynamically stable but needs further workup of fever and encephalopathy with STAT labs, blood cultures, advanced imaging of brain and likely admission. -Send to emergency department for further evaluation and management

## 2017-04-06 NOTE — Telephone Encounter (Signed)
Daughter is trying to get pt here for 1545 appt, she had to get off work, if she cant they will go to ED

## 2017-04-06 NOTE — Telephone Encounter (Signed)
I agree. Thank you.

## 2017-04-06 NOTE — Telephone Encounter (Signed)
Today, homecare PT calls and reports that pt's temp is 102. 5 oral, pt denies pain, all other symptoms but caregivers and PT states pt is a little more mentally alert than normal, more confused, more memory impairment. Appetite is normal, fluid intake is normal, urine has no bad odor or abnormal color. Please advise Caregiver marion [phone]

## 2017-04-06 NOTE — Progress Notes (Signed)
   CC: Fever and confusion  HPI:  [name] is a 76 y.o. woman with history of hypertension, HFpEF, schizophrenia, and recent subdural hematoma who presents for evaluation of fever.  Collateral provided by daughter.  For the last 2 days [name] has been confused and acting strangely, different from her baseline. She has not had any falls or injuries. Today her home health speech pathologist check vitals recorded a temperature of 102.5. She called the clinic for an urgent visit for fever and altered mental status.  No history from daughter patient of dysuria, cough, headache, or other symptoms.  Past Medical History:  Diagnosis Date  . Chronic venous insufficiency   . Colon, diverticulosis Sept. 2011   outpatient colonoscopy by Dr. Cristina Gong.  Need record  . DDD (degenerative disc disease), lumbosacral   . Depression   . DJD (degenerative joint disease), multiple sites    Low back pain worst  . GERD (gastroesophageal reflux disease)   . Gout   . Gout    Uric Acid level 4.2 on 300 allopurinol  . History of cervical cancer 1972  . Hx-TIA (transient ischemic attack) 05/13/2001   Right facial numbness  . Hyperlipidemia   . Hypertension   . Memory disorder 12/20/2016  . Mitral valve prolapse 10/27/1999   Subsequent 2D echo show normal mitral valve  . Osteoarthritis of hip    bilateral hips  . Ovarian cancer ([place]) 1972   S/P oophorectomy  . Psychosis   . Recurrent boils    History of MRSA skin infections with abscess  . Sensorineural hearing loss of both ears   . Subdural hematoma ([place]) 01/26/2017   bilateral    Review of Systems:   Unable to obtain review systems due to confusion.  Physical Exam:  Vitals:   04/06/17 1704  BP: 120/88  Pulse: (!) 122  Temp: (!) 103.2 F (39.6 C)  TempSrc: Oral  SpO2: 99%  Weight: 206 lb 9.6 oz (93.7 kg)    Exam limited by patient cooperation  Physical Exam  Constitutional:  Obese woman sitting in wheelchair in no  acute distress  HENT:  Head: Normocephalic and atraumatic.  Eyes: Conjunctivae are normal. No scleral icterus.  Cardiovascular:  Tachycardic  Pulmonary/Chest: Effort normal and breath sounds normal. No respiratory distress.  Abdominal: Soft. She exhibits no distension. There is no tenderness.  Genitourinary:  Genitourinary Comments: No suprapubic tenderness  Neurological:  Alert Oriented to self and place but not situation Not at baseline per daughter Repetitive facial movements Extraocular movements intact Pupils equally round and reactive to light Strength grossly normal and symmetric in all extremities  Skin:  Skin hot and dry No rash    Assessment & Plan:   See Encounters Tab for problem based charting.  Patient seen with Dr. Dareen Piano

## 2017-04-07 DIAGNOSIS — Z9181 History of falling: Secondary | ICD-10-CM

## 2017-04-07 DIAGNOSIS — Z823 Family history of stroke: Secondary | ICD-10-CM

## 2017-04-07 DIAGNOSIS — Z822 Family history of deafness and hearing loss: Secondary | ICD-10-CM

## 2017-04-07 DIAGNOSIS — Z79899 Other long term (current) drug therapy: Secondary | ICD-10-CM

## 2017-04-07 DIAGNOSIS — Z886 Allergy status to analgesic agent status: Secondary | ICD-10-CM

## 2017-04-07 DIAGNOSIS — Z90710 Acquired absence of both cervix and uterus: Secondary | ICD-10-CM | POA: Diagnosis not present

## 2017-04-07 DIAGNOSIS — F209 Schizophrenia, unspecified: Secondary | ICD-10-CM

## 2017-04-07 DIAGNOSIS — Z8679 Personal history of other diseases of the circulatory system: Secondary | ICD-10-CM

## 2017-04-07 DIAGNOSIS — Z8541 Personal history of malignant neoplasm of cervix uteri: Secondary | ICD-10-CM | POA: Diagnosis not present

## 2017-04-07 DIAGNOSIS — Z87891 Personal history of nicotine dependence: Secondary | ICD-10-CM

## 2017-04-07 DIAGNOSIS — Z833 Family history of diabetes mellitus: Secondary | ICD-10-CM

## 2017-04-07 DIAGNOSIS — F39 Unspecified mood [affective] disorder: Secondary | ICD-10-CM | POA: Diagnosis not present

## 2017-04-07 DIAGNOSIS — Z7952 Long term (current) use of systemic steroids: Secondary | ICD-10-CM | POA: Diagnosis not present

## 2017-04-07 DIAGNOSIS — K219 Gastro-esophageal reflux disease without esophagitis: Secondary | ICD-10-CM

## 2017-04-07 DIAGNOSIS — N3941 Urge incontinence: Secondary | ICD-10-CM | POA: Diagnosis not present

## 2017-04-07 DIAGNOSIS — Z8543 Personal history of malignant neoplasm of ovary: Secondary | ICD-10-CM | POA: Diagnosis not present

## 2017-04-07 DIAGNOSIS — I10 Essential (primary) hypertension: Secondary | ICD-10-CM

## 2017-04-07 DIAGNOSIS — Z8673 Personal history of transient ischemic attack (TIA), and cerebral infarction without residual deficits: Secondary | ICD-10-CM | POA: Diagnosis not present

## 2017-04-07 DIAGNOSIS — M25552 Pain in left hip: Secondary | ICD-10-CM | POA: Diagnosis not present

## 2017-04-07 DIAGNOSIS — Z96643 Presence of artificial hip joint, bilateral: Secondary | ICD-10-CM | POA: Diagnosis not present

## 2017-04-07 DIAGNOSIS — G4733 Obstructive sleep apnea (adult) (pediatric): Secondary | ICD-10-CM | POA: Diagnosis not present

## 2017-04-07 DIAGNOSIS — R8299 Other abnormal findings in urine: Secondary | ICD-10-CM

## 2017-04-07 DIAGNOSIS — Z882 Allergy status to sulfonamides status: Secondary | ICD-10-CM

## 2017-04-07 DIAGNOSIS — M160 Bilateral primary osteoarthritis of hip: Secondary | ICD-10-CM | POA: Diagnosis not present

## 2017-04-07 DIAGNOSIS — R350 Frequency of micturition: Secondary | ICD-10-CM

## 2017-04-07 DIAGNOSIS — R8271 Bacteriuria: Secondary | ICD-10-CM

## 2017-04-07 DIAGNOSIS — E785 Hyperlipidemia, unspecified: Secondary | ICD-10-CM

## 2017-04-07 DIAGNOSIS — Z8249 Family history of ischemic heart disease and other diseases of the circulatory system: Secondary | ICD-10-CM

## 2017-04-07 DIAGNOSIS — E669 Obesity, unspecified: Secondary | ICD-10-CM

## 2017-04-07 DIAGNOSIS — M5137 Other intervertebral disc degeneration, lumbosacral region: Secondary | ICD-10-CM | POA: Diagnosis not present

## 2017-04-07 DIAGNOSIS — Z87440 Personal history of urinary (tract) infections: Secondary | ICD-10-CM

## 2017-04-07 DIAGNOSIS — M109 Gout, unspecified: Secondary | ICD-10-CM | POA: Diagnosis not present

## 2017-04-07 DIAGNOSIS — N390 Urinary tract infection, site not specified: Secondary | ICD-10-CM | POA: Diagnosis not present

## 2017-04-07 DIAGNOSIS — Z818 Family history of other mental and behavioral disorders: Secondary | ICD-10-CM

## 2017-04-07 DIAGNOSIS — Z8614 Personal history of Methicillin resistant Staphylococcus aureus infection: Secondary | ICD-10-CM | POA: Diagnosis not present

## 2017-04-07 DIAGNOSIS — G8929 Other chronic pain: Secondary | ICD-10-CM | POA: Diagnosis not present

## 2017-04-07 DIAGNOSIS — Z79891 Long term (current) use of opiate analgesic: Secondary | ICD-10-CM | POA: Diagnosis not present

## 2017-04-07 DIAGNOSIS — R509 Fever, unspecified: Secondary | ICD-10-CM

## 2017-04-07 DIAGNOSIS — Z8619 Personal history of other infectious and parasitic diseases: Secondary | ICD-10-CM

## 2017-04-07 LAB — BASIC METABOLIC PANEL
Anion gap: 11 (ref 5–15)
BUN: 20 mg/dL (ref 6–20)
CO2: 26 mmol/L (ref 22–32)
Calcium: 8.7 mg/dL — ABNORMAL LOW (ref 8.9–10.3)
Chloride: 102 mmol/L (ref 101–111)
Creatinine, Ser: 1.63 mg/dL — ABNORMAL HIGH (ref 0.44–1.00)
GFR calc Af Amer: 34 mL/min — ABNORMAL LOW (ref >60)
GFR calc non Af Amer: 30 mL/min — ABNORMAL LOW (ref >60)
Glucose, Bld: 105 mg/dL — ABNORMAL HIGH (ref 65–99)
Potassium: 3.5 mmol/L (ref 3.5–5.1)
Sodium: 139 mmol/L (ref 135–145)

## 2017-04-07 LAB — CBC
HCT: 31.1 % — ABNORMAL LOW (ref 36.0–46.0)
Hemoglobin: 9.9 g/dL — ABNORMAL LOW (ref 12.0–15.0)
MCH: 30.5 pg (ref 26.0–34.0)
MCHC: 31.8 g/dL (ref 30.0–36.0)
MCV: 95.7 fL (ref 78.0–100.0)
Platelets: 221 10*3/uL (ref 150–400)
RBC: 3.25 MIL/uL — ABNORMAL LOW (ref 3.87–5.11)
RDW: 16.4 % — ABNORMAL HIGH (ref 11.5–15.5)
WBC: 5.6 10*3/uL (ref 4.0–10.5)

## 2017-04-07 LAB — MRSA PCR SCREENING: MRSA by PCR: NEGATIVE

## 2017-04-07 NOTE — Progress Notes (Signed)
Pt was admitted from ED per stretcher accompanied by a nurse tech and pt daughter, on arrival to the floor pt was fully alert and oriented self introduced to pt and family, ID bracelet varified with pt, vital signs are stable, oriented to the room and pt care equipment, fall assessment and fall prevention done, pt was able to demonstrate how to call for help when need, call light and phone within reach and pt able to demonstrate how to use them, due medication given will continue to monitor pt

## 2017-04-07 NOTE — Evaluation (Signed)
Physical Therapy Evaluation Patient Details Name: [name] MRN: [id number] DOB: 04-24-1941 Today's Date: 04/07/2017   History of Present Illness  [name] is a 76 y.o. female with a past medical history significant for schizophrenia well-compensated, OSA not on CPAP, HTN, hx of TIA and gout was admitted following a fall. Pt with hx significant for multiple falls and daughter reports increased LE weakness. Pt s/p B burr holes in April 2018.Pt is now admitted with increased confusion as a resultant of a UTI.    Clinical Impression  Pt presents with the above diagnosis and below deficits for therapy evaluation. Prior to admission, pt was living with her daughter in a two-story home with her main living environment on the first floor. Pt has caregivers with her for 5hrs a day while her daughter works to assist as needed. Pt requires Min guard to supervision for all mobility this session with RW. Pt will benefit from continued acute services to address the below deficits prior to DC.     Follow Up Recommendations Home health PT    Equipment Recommendations  None recommended by PT    Recommendations for Other Services       Precautions / Restrictions Precautions Precautions: Fall Restrictions Weight Bearing Restrictions: No      Mobility  Bed Mobility Overal bed mobility: Independent             General bed mobility comments: per daugther pt is able to get OOB without assistance  Transfers Overall transfer level: Needs assistance Equipment used: Rolling walker (2 wheeled) Transfers: Sit to/from Stand Sit to Stand: Supervision         General transfer comment: supervision for safety  Ambulation/Gait Ambulation/Gait assistance: Supervision;Min guard Ambulation Distance (Feet): 150 Feet Assistive device: Rolling walker (2 wheeled) Gait Pattern/deviations: Step-through pattern;Decreased step length - right;Decreased step length - left Gait velocity:  decreased Gait velocity interpretation: Below normal speed for age/gender General Gait Details: slow cadence, decreased step length but good seqeuncing  Stairs            Wheelchair Mobility    Modified Rankin (Stroke Patients Only)       Balance Overall balance assessment: Needs assistance Sitting-balance support: No upper extremity supported;Feet supported Sitting balance-Leahy Scale: Good     Standing balance support: No upper extremity supported Standing balance-Leahy Scale: Fair                               Pertinent Vitals/Pain Pain Assessment: No/denies pain    Home Living Family/patient expects to be discharged to:: Private residence Living Arrangements: Children Available Help at Discharge: Family;Personal care attendant Type of Home: House Home Access: Ramped entrance;Stairs to enter Entrance Stairs-Rails: None Entrance Stairs-Number of Steps: 1 Home Layout: One level Home Equipment: Corn - 4 wheels;Cane - single point;Bedside commode;Walker - 2 wheels;Shower seat;Hand held shower head;Grab bars - tub/shower Additional Comments: has aide 5 days a week when daughter works     Prior Function Level of Independence: Needs assistance   Gait / Transfers Assistance Needed: pt has been using a RW since returning home  ADL's / Homemaking Assistance Needed: daughter reports pt having difficulty with LB bathing and dressing, has been assisted for IADL since April        Hand Dominance   Dominant Hand: Left    Extremity/Trunk Assessment   Upper Extremity Assessment Upper Extremity Assessment: Overall WFL for tasks assessed  Lower Extremity Assessment Lower Extremity Assessment: Overall WFL for tasks assessed    Cervical / Trunk Assessment Cervical / Trunk Assessment: Normal  Communication   Communication: HOH  Cognition Arousal/Alertness: Awake/alert Behavior During Therapy: WFL for tasks assessed/performed Overall Cognitive  Status: Within Functional Limits for tasks assessed                                        General Comments      Exercises     Assessment/Plan    PT Assessment Patient needs continued PT services  PT Problem List Decreased activity tolerance;Decreased balance;Decreased mobility       PT Treatment Interventions Gait training;Functional mobility training;Therapeutic activities;Therapeutic exercise;Balance training    PT Goals (Current goals can be found in the Care Plan section)  Acute Rehab PT Goals Patient Stated Goal: to get home PT Goal Formulation: With patient Time For Goal Achievement: 04/14/17 Potential to Achieve Goals: Good    Frequency Min 3X/week   Barriers to discharge        Co-evaluation               AM-PAC PT "6 Clicks" Daily Activity  Outcome Measure Difficulty turning over in bed (including adjusting bedclothes, sheets and blankets)?: None Difficulty moving from lying on back to sitting on the side of the bed? : None Difficulty sitting down on and standing up from a chair with arms (e.g., wheelchair, bedside commode, etc,.)?: Total Help needed moving to and from a bed to chair (including a wheelchair)?: A Little Help needed walking in hospital room?: A Little Help needed climbing 3-5 steps with a railing? : A Little 6 Click Score: 18    End of Session Equipment Utilized During Treatment: Gait belt Activity Tolerance: Patient tolerated treatment well Patient left: in chair;with call bell/phone within reach;with family/visitor present Nurse Communication: Mobility status PT Visit Diagnosis: Difficulty in walking, not elsewhere classified (R26.2)    Time: 6384-6659 PT Time Calculation (min) (ACUTE ONLY): 15 min   Charges:   PT Evaluation $PT Eval Moderate Complexity: 1 Procedure     PT G Codes:   PT G-Codes **NOT FOR INPATIENT CLASS** Functional Assessment Tool Used: Clinical judgement;AM-PAC 6 Clicks Basic  Mobility Functional Limitation: Mobility: Walking and moving around Mobility: Walking and Moving Around Current Status (D3570): At least 40 percent but less than 60 percent impaired, limited or restricted Mobility: Walking and Moving Around Goal Status [phone]): At least 1 percent but less than 20 percent impaired, limited or restricted    Scheryl Marten PT, DPT  [phone]   Shanon Rosser 04/07/2017, 3:32 PM

## 2017-04-07 NOTE — Progress Notes (Signed)
   Subjective:  Patient alert and able to answer questions appropriately. She denies chest pain, shortness of breath, abdominal pain, dysuria, hematuria. She does endorse chronic left hip pain which limits her ability to mobilize independently.   Objective:  Vital signs in last 24 hours: Vitals:   04/06/17 2313 04/07/17 0015 04/07/17 0438 04/07/17 0848  BP: 97/68  (!) 105/50 (!) 111/57  Pulse: 93  85   Resp: 18  18   Temp: (!) 101 F (38.3 C) 99.7 F (37.6 C) 99.6 F (37.6 C)   TempSrc: Rectal Rectal    SpO2: 98%  95%   Weight:   201 lb 14.4 oz (91.6 kg)   Height:   5\' 4"  (1.626 m)    Constitutional: NAD, lying in bed comfortably CV: Distant heart sounds, RRR, no murmurs, rubs or gallops appreciated Resp: CTAB, no increased work of breathing, no LE edema Abd: soft, +BS, NDNT MSK: tenderness to palpation of left superior iliac crest; no tenderness over hip joint, spine, SI joints or paraspinal musculature. No swelling or erythema at hip.   Assessment/Plan:  Active Problems:   UTI (urinary tract infection)  UTI:  Patient with last fever at 11pm last night; she seems to be back to her baseline mental status wise. Discussed with patient's daughter who is in agreement with patient staying overnight to receive further IV antibiotics with possible d/c tomorrow if she continues to improve.  --f/u AM CBC --follow fever curve --continue IV ceftriaxone --f/u Urine and blood cultures  Left hip pain: Point tenderness at superior iliac crest; she had hip x rays last month following fall which showed no breakdown of her hip arthroplasty and no fractures.  --continue working with PT --tramadol 50mg  BID PRN, tylenol PRN  Urge incontinence: --continue home oxybutynin 5mg  qhs  Mood disorder: --continue home alprazolam 0.25mg  po qd PRN --continue home bupropion 300mg  daily   Dispo: Anticipated discharge in approximately 1 day.   Alphonzo Grieve, MD 04/07/2017, 12:31 PM Pager  [phone]

## 2017-04-08 LAB — URINE CULTURE: Culture: >=100000 — AB

## 2017-04-08 LAB — CBC
HCT: 30.3 % — ABNORMAL LOW (ref 36.0–46.0)
Hemoglobin: 9.5 g/dL — ABNORMAL LOW (ref 12.0–15.0)
MCH: 30.3 pg (ref 26.0–34.0)
MCHC: 31.4 g/dL (ref 30.0–36.0)
MCV: 96.5 fL (ref 78.0–100.0)
Platelets: 228 10*3/uL (ref 150–400)
RBC: 3.14 MIL/uL — ABNORMAL LOW (ref 3.87–5.11)
RDW: 16.5 % — ABNORMAL HIGH (ref 11.5–15.5)
WBC: 4.5 10*3/uL (ref 4.0–10.5)

## 2017-04-08 LAB — BASIC METABOLIC PANEL
Anion gap: 8 (ref 5–15)
BUN: 18 mg/dL (ref 6–20)
CO2: 27 mmol/L (ref 22–32)
Calcium: 8.6 mg/dL — ABNORMAL LOW (ref 8.9–10.3)
Chloride: 105 mmol/L (ref 101–111)
Creatinine, Ser: 1.52 mg/dL — ABNORMAL HIGH (ref 0.44–1.00)
GFR calc Af Amer: 38 mL/min — ABNORMAL LOW (ref >60)
GFR calc non Af Amer: 32 mL/min — ABNORMAL LOW (ref >60)
Glucose, Bld: 118 mg/dL — ABNORMAL HIGH (ref 65–99)
Potassium: 3.7 mmol/L (ref 3.5–5.1)
Sodium: 140 mmol/L (ref 135–145)

## 2017-04-08 MED ORDER — CEPHALEXIN 500 MG PO CAPS: 500.0000 mg | ORAL_CAPSULE | Freq: Two times a day (BID) | ORAL | 0 refills | Status: DC

## 2017-04-08 MED ORDER — CEPHALEXIN 500 MG PO CAPS
500.0000 mg | ORAL_CAPSULE | Freq: Two times a day (BID) | ORAL | Status: DC
  Administered 2017-04-08: 500 mg via ORAL
  Filled 2017-04-08: qty 1

## 2017-04-08 NOTE — Care Management Note (Signed)
Case Management Note  Patient Details  Name: [name] MRN: [id number] Date of Birth: [birth year]  Subjective/Objective:                 Spoke with patient's daughter. Ptaient active w [facility] pta for PT OT ST, referral placed to [facility] for resumption of services. Daughter could not identify any other needs.    Action/Plan:  Will DC to home w [place].  Expected Discharge Date:  04/08/17               Expected Discharge Plan:  [place]  In-House Referral:     Discharge planning Services  CM Consult  Post Acute Care Choice:  Home Health Choice offered to:  Adult Children  DME Arranged:    DME Agency:     HH Arranged:  PT, OT, Speech Therapy HH Agency:  [facility]  Status of Service:  Completed, signed off  If discussed at [place] of Stay Meetings, dates discussed:    Additional Comments:  Carles Collet, RN 04/08/2017, 1:04 PM

## 2017-04-08 NOTE — Progress Notes (Signed)
[name] to be D/C'd Home per MD order.  Discussed with the patient and all questions fully answered.  VSS, Skin clean, dry and intact without evidence of skin break down, no evidence of skin tears noted. IV catheter discontinued intact. Site without signs and symptoms of complications. Dressing and pressure applied.  An After Visit Summary was printed and given to the patient. Patient received prescription.  D/c education completed with patient/family including follow up instructions, medication list, d/c activities limitations if indicated, with other d/c instructions as indicated by MD - patient able to verbalize understanding, all questions fully answered.   Patient instructed to return to ED, call 911, or call MD for any changes in condition.   Patient escorted via [place], and D/C home via private auto.  Dorris Carnes 04/08/2017 6:51 PM

## 2017-04-08 NOTE — Progress Notes (Signed)
   Subjective:  Patient seen and examined this morning with her daughter present.  No acute events and no complaints.    Objective:  Vital signs in last 24 hours: Vitals:   04/07/17 1451 04/07/17 1452 04/07/17 2120 04/08/17 0529  BP: 107/62  135/64 115/62  Pulse: 91  96 86  Resp:   19 18  Temp:  98.4 F (36.9 C) 99.5 F (37.5 C) 98.9 F (37.2 C)  TempSrc:  Oral    SpO2:   100% 100%  Weight:      Height:       Constitutional: sitting up in chair and watching TV CV: RRR, no murmurs, rubs or gallops appreciated Resp: CTAB, no increased work of breathing, no LE edema Abd: soft, +BS, NDNT Psych: normal mood and affect Skin: clean, dry, intact.  Assessment/Plan:  Active Problems:   UTI (urinary tract infection)  UTI:  Patient afebrile last 24 hours on ceftriaxone for UTI.  Urine cx from clinic with >100,000 colonies of E. Coli with sensitivity to oral options.  Urine cx obtained through the ED with GNRs but no further speciation data as of yet - transition to oral medications >> Keflex 500mg  BID for 3 more days.  Stop 6/12. - discharge home today  Left hip pain: Point tenderness at superior iliac crest; she had hip x rays last month following fall which showed no breakdown of her hip arthroplasty and no fractures.  --continue working with PT  Urge incontinence: --continue home oxybutynin 5mg  qhs  Mood disorder: --continue home alprazolam 0.25mg  po qd PRN --continue home bupropion 300mg  daily   Dispo: Anticipated discharge today.   Jule Ser, DO 04/08/2017, 9:24 AM

## 2017-04-08 NOTE — Discharge Summary (Signed)
Name: [name] MRN: [id number] DOB: [birth year] 76 y.o. PCP: Bartholomew Crews, MD  Date of Admission: 04/06/2017  5:38 PM Date of Discharge: 04/08/2017 Attending Physician: No att. providers found  Discharge Diagnosis:  Active Problems:   UTI (urinary tract infection)   Discharge Medications: Allergies as of 04/08/2017      Reactions   Aspirin Other (See Comments)   Bleeding from vagina   Sulfamethoxazole-trimethoprim Itching      Medication List    STOP taking these medications   HYDROcodone-acetaminophen 5-325 MG tablet Commonly known as:  NORCO/VICODIN     TAKE these medications   ALIVE WOMENS ENERGY Tabs Take 1 tablet by mouth daily.   allopurinol 300 MG tablet Commonly known as:  ZYLOPRIM Take 1 tablet (300 mg total) by mouth daily.   ALPRAZolam 0.25 MG tablet Commonly known as:  XANAX Take 1 tablet (0.25 mg total) by mouth 3 (three) times daily as needed for anxiety.   amLODipine 10 MG tablet Commonly known as:  NORVASC Take 1 tablet (10 mg total) by mouth daily.   ascorbic acid 500 MG tablet Commonly known as:  VITAMIN C Take 500 mg by mouth daily.   atorvastatin 40 MG tablet Commonly known as:  LIPITOR Take 1 tablet (40 mg total) by mouth daily.   buPROPion 300 MG 24 hr tablet Commonly known as:  WELLBUTRIN XL Take 1 tablet (300 mg total) by mouth every morning.   cephALEXin 500 MG capsule Commonly known as:  KEFLEX Take 1 capsule (500 mg total) by mouth 2 (two) times daily.   docusate sodium 100 MG capsule Commonly known as:  COLACE Take 1 capsule (100 mg total) by mouth every 12 (twelve) hours.   fluocinonide cream 0.05 % Commonly known as:  LIDEX APPLY TO AFFECTED AREA TWICE DAILY   fluticasone 50 MCG/ACT nasal spray Commonly known as:  FLONASE INSTILL 2 SPRAYS IN EACH NOSTRIL DAILY   furosemide 40 MG tablet Commonly known as:  LASIX Take 1 tablet (40 mg total) by mouth daily.   hydrOXYzine 25 MG tablet Commonly known  as:  ATARAX/VISTARIL Take 1 tablet (25 mg total) by mouth at bedtime.   losartan 100 MG tablet Commonly known as:  COZAAR Take 1 tablet (100 mg total) by mouth daily.   oxybutynin 5 MG 24 hr tablet Commonly known as:  DITROPAN-XL Take 1 tablet (5 mg total) by mouth at bedtime.   pantoprazole 20 MG tablet Commonly known as:  PROTONIX Take 1 tablet (20 mg total) by mouth 2 (two) times daily.   potassium chloride 10 MEQ CR capsule Commonly known as:  MICRO-K Take 1 capsule (10 mEq total) by mouth daily.   traMADol 50 MG tablet Commonly known as:  ULTRAM Take 1 tablet (50 mg total) by mouth 2 (two) times daily as needed.       Disposition and follow-up:   [name] was discharged from [facility] in Stable condition.  At the hospital follow up visit please address:  1.  Resolution of urinary symptoms, resolved fever curve, consider referral to Urology per daughters request.  Completion of Keflex (End date: 04/10/2017)  2.  Labs / imaging needed at time of follow-up: CBC, BMET  3.  Pending labs/ test needing follow-up: urine culture 04/06/2017 from ED, blood cultures  Follow-up Appointments: Follow-up Information    Bartholomew Crews, MD. Call.   Specialty:  Internal Medicine Why:  Early this week to schedule hospital follow  up appointment. Contact information: [place] [id number] [place], [facility] Follow up.   Why:  resume services established prior to [place]. Contact information: [place] [address] [place] Hospital Course by problem list: Active Problems:   UTI (urinary tract infection)   1. Urinary Tract Infection: Patient presented to ED from clinic with fevers, confusion, and history of foul-smelling urine and increased frequency per her daughter.  UA from the ED showed rare bacteria, positive nitrites and leukocytes.  Urine  culture obtained that same day from clinic grew E. Coli as above.  Urine culture from ED also grew E. Coli with sensitivities pending.  She was initially treated with Ceftriaxone and had resolution of her fever and mental status.  Based on culture results from the clinic, she was transitioned to Keflex to complete her course of antibiotics.  Her final ED urine culture will be followed and if a change to antibiotics needs to be made, patient will be contacted.  There were no other changes to her chronic medical problems or medications.  Discharge Vitals:   BP 114/67   Pulse 86   Temp 98.9 F (37.2 C)   Resp 18   Ht 5\' 4"  (1.626 m)   Wt 201 lb 14.4 oz (91.6 kg)   SpO2 100%   BMI 34.66 kg/m   Pertinent Labs, Studies, and Procedures:  Urinalysis    Component Value Date/Time   COLORURINE YELLOW 04/06/2017 1952   APPEARANCEUR HAZY (A) 04/06/2017 1952   LABSPEC 1.010 04/06/2017 1952   PHURINE 5.0 04/06/2017 1952   GLUCOSEU NEGATIVE 04/06/2017 1952   GLUCOSEU NEG mg/dL 11/01/2010 2152   HGBUR NEGATIVE 04/06/2017 1952   HGBUR trace-intact 11/28/2007 [place] 04/06/2017 1952   BILIRUBINUR NEGATIVE 04/06/2017 [place] 04/06/2017 1952   PROTEINUR NEGATIVE 04/06/2017 1952   UROBILINOGEN 0.2 04/06/2017 1648   UROBILINOGEN 0.2 01/03/2015 1506   NITRITE POSITIVE (A) 04/06/2017 1952   LEUKOCYTESUR LARGE (A) 04/06/2017 1952    Urine Culture 04/06/2017 (Clinic): > 100,000 colonies/mL E. Coli Urine Culture 04/06/2017 (ED):  >100,000 colonies/mL E. Coli  BMP Latest Ref Rng & Units 04/08/2017 04/07/2017 04/06/2017  Glucose 65 - 99 mg/dL 118(H) 105(H) 113(H)  BUN 6 - 20 mg/dL 18 20 17   Creatinine 0.44 - 1.00 mg/dL 1.52(H) 1.63(H) 1.55(H)  BUN/Creat Ratio 12 - 28 - - -  Sodium 135 - 145 mmol/L 140 139 136  Potassium 3.5 - 5.1 mmol/L 3.7 3.5 3.8  Chloride 101 - 111 mmol/L 105 102 100(L)  CO2 22 - 32 mmol/L 27 26 25   Calcium 8.9 - 10.3 mg/dL 8.6(L) 8.7(L) 9.1    CBC Latest Ref Rng & Units 04/08/2017 04/07/2017 04/06/2017  WBC 4.0 - 10.5 K/uL 4.5 5.6 7.3  Hemoglobin 12.0 - 15.0 g/dL 9.5(L) 9.9(L) 11.5(L)  Hematocrit 36.0 - 46.0 % 30.3(L) 31.1(L) 35.8(L)  Platelets 150 - 400 K/uL 228 221 248     Discharge Instructions: Discharge Instructions    Call MD for:  difficulty breathing, headache or visual disturbances    Complete by:  As directed    Call MD for:  persistant nausea and vomiting    Complete by:  As directed    Call MD for:  redness, tenderness, or signs of infection (pain, swelling, redness, odor or green/yellow discharge around incision site)  Complete by:  As directed    Call MD for:  severe uncontrolled pain    Complete by:  As directed    Call MD for:  temperature >100.4    Complete by:  As directed    Diet - low sodium heart healthy    Complete by:  As directed    Discharge instructions    Complete by:  As directed    Ms Hattery,  It was a pleasure taking care of you in the hospital.  You were admitted with a Urinary Tract Infection.  This has been treated with intravenous antibiotics here in the hospital.  You will be transitioned to oral antibiotics to complete 3 more days.  This will give you a total of 5 days of treatment because you received 2 days in the hospital.  Please call clinic on Monday morning to set up a hospital follow up appointment.  No other changes were made to your daily and as needed medications this admission.  Take Care.   Increase activity slowly    Complete by:  As directed       Signed: Jule Ser, DO 04/08/2017, 6:54 PM   Pager: [phone]

## 2017-04-08 NOTE — Progress Notes (Signed)
[name] to be D/C'd Home per MD order.  Discussed with the patient and all questions fully answered.  VSS, Skin clean, dry and intact without evidence of skin break down, no evidence of skin tears noted. IV catheter discontinued intact. Site without signs and symptoms of complications. Dressing and pressure applied.  An After Visit Summary was printed and given to the patient. Patient received prescription.  D/c education completed with patient/family including follow up instructions, medication list, d/c activities limitations if indicated, with other d/c instructions as indicated by MD - patient able to verbalize understanding, all questions fully answered.   Patient instructed to return to ED, call 911, or call MD for any changes in condition.   Patient escorted via [place], and D/C home via private auto.  Dorris Carnes 04/08/2017 3:06 PM

## 2017-04-09 LAB — URINE CULTURE: Culture: >=100000 — AB

## 2017-04-09 NOTE — Progress Notes (Signed)
Internal Medicine Clinic Attending  I saw and evaluated the patient.  I personally confirmed the key portions of the history and exam documented by Dr. O'Sullivan and I reviewed pertinent patient test results.  The assessment, diagnosis, and plan were formulated together and I agree with the documentation in the resident's note.   

## 2017-04-10 ENCOUNTER — Telehealth: Payer: Self-pay

## 2017-04-10 ENCOUNTER — Telehealth: Payer: Self-pay | Admitting: Internal Medicine

## 2017-04-10 ENCOUNTER — Ambulatory Visit (INDEPENDENT_AMBULATORY_CARE_PROVIDER_SITE_OTHER): Payer: PPO | Admitting: Urology

## 2017-04-10 ENCOUNTER — Encounter: Payer: Self-pay | Admitting: Urology

## 2017-04-10 ENCOUNTER — Encounter: Payer: Self-pay | Admitting: Internal Medicine

## 2017-04-10 VITALS — BP 123/76 | HR 96 | Temp 98.2°F | Ht 63.0 in | Wt 206.3 lb

## 2017-04-10 DIAGNOSIS — N390 Urinary tract infection, site not specified: Secondary | ICD-10-CM

## 2017-04-10 DIAGNOSIS — S065X1S Traumatic subdural hemorrhage with loss of consciousness of 30 minutes or less, sequela: Secondary | ICD-10-CM | POA: Diagnosis not present

## 2017-04-10 MED ORDER — CEPHALEXIN 250 MG PO CAPS: 250.0000 mg | ORAL_CAPSULE | Freq: Every day | ORAL | 0 refills | Status: DC

## 2017-04-10 NOTE — Telephone Encounter (Signed)
I cannot write a letter with information about her visit and hospitalization at her daughter's request. The designated party release we currently have documented in her chart only applies to verbal information. Please ask [name] to request a letter or to authorize disclosure in written form regarding her recent admission.

## 2017-04-10 NOTE — Progress Notes (Signed)
04/10/2017 1:31 PM   [name] February 12, 1941 [id number]  Referring provider: Bartholomew Crews, MD [address] [place], [place] [address]  Chief Complaint  Patient presents with  . New Patient (Initial Visit)    Hospital Follow up uti    HPI: Consultation for recurrent UTI. Patient has been treated for multiple positive urine cultures over the last 4 years. She grew Klebsiella and Proteus in 2014 and then had a l and urinary tract infections but they started up again in May 2018 with a positive Escherichia coli culture. Then 4 days ago she had confusion and fever and a urine culture grew Escherichia coli again. A urinalysis didn't look too bad showing rare bacteria, 6-30 white cells and no red cells. Her prior UA did clear after treatment with antibiotics. The cultures grew e coli sensitive to nitrofurantoin and cephalexin but resistant to Bactrim. She's on 500 mg cephalexin BID.   She's been in rehab. She has occasional urine odor. She has had reddish, brown urine. She stays well hydrated. She normally voids with a good flow and feels empty. She was 74 yr smoker and stopped around 2005.   Neurogenic risk includes chronic subdural hematoma since Mar 2018. She has chronic kidney disease with creatinine of 1.5-1.6. This correlates to a GFR around 34.  No h/o GU surgery or history. She has had a TAHx. She has no issue with constipation.      PMH: Past Medical History:  Diagnosis Date  . Chronic venous insufficiency   . Colon, diverticulosis Sept. 2011   outpatient colonoscopy by Dr. Cristina Gong.  Need record  . DDD (degenerative disc disease), lumbosacral   . Depression   . DJD (degenerative joint disease), multiple sites    Low back pain worst  . GERD (gastroesophageal reflux disease)   . Gout   . Gout    Uric Acid level 4.2 on 300 allopurinol  . History of cervical cancer 1972  . Hx-TIA (transient ischemic attack) 05/13/2001   Right facial numbness  .  Hyperlipidemia   . Hypertension   . Memory disorder 12/20/2016  . Mitral valve prolapse 10/27/1999   Subsequent 2D echo show normal mitral valve  . Osteoarthritis of hip    bilateral hips  . Ovarian cancer ([facility]) 1972   S/P oophorectomy  . Psychosis   . Recurrent boils    History of MRSA skin infections with abscess  . Sensorineural hearing loss of both ears   . Subdural hematoma ([facility]) 01/26/2017   bilateral    Surgical History: Past Surgical History:  Procedure Laterality Date  . ABDOMINAL HYSTERECTOMY     1972  . BURR HOLE Bilateral 02/12/2017   Procedure: Haskell Flirt;  Surgeon: Earnie Larsson, MD;  Location: [facility];  Service: Neurosurgery;  Laterality: Bilateral;  . CERVICAL DISCECTOMY  7/07   C5-C6  . JOINT REPLACEMENT     bilateral hip replacement  . LUMBAR DISC SURGERY     L5-S1 7/07  . LUMBAR LAMINECTOMY/DECOMPRESSION MICRODISCECTOMY N/A 02/02/2016   Procedure: L4-5 Decompression;  Surgeon: Marybelle Killings, MD;  Location: [facility];  Service: Orthopedics;  Laterality: N/A;  . OOPHORECTOMY     1972 for ovarian cancer    Home Medications:  Allergies as of 04/10/2017      Reactions   Aspirin Other (See Comments)   Bleeding from vagina   Sulfamethoxazole-trimethoprim Itching      Medication List       Accurate as of 04/10/17  1:31  PM. Always use your most recent med list.          ALIVE WOMENS ENERGY Tabs Take 1 tablet by mouth daily.   allopurinol 300 MG tablet Commonly known as:  ZYLOPRIM Take 1 tablet (300 mg total) by mouth daily.   ALPRAZolam 0.25 MG tablet Commonly known as:  XANAX Take 1 tablet (0.25 mg total) by mouth 3 (three) times daily as needed for anxiety.   amLODipine 10 MG tablet Commonly known as:  NORVASC Take 1 tablet (10 mg total) by mouth daily.   ascorbic acid 500 MG tablet Commonly known as:  VITAMIN C Take 500 mg by mouth daily.   atorvastatin 40 MG tablet Commonly known as:  LIPITOR Take 1 tablet (40 mg total) by mouth daily.     buPROPion 300 MG 24 hr tablet Commonly known as:  WELLBUTRIN XL Take 1 tablet (300 mg total) by mouth every morning.   cephALEXin 500 MG capsule Commonly known as:  KEFLEX Take 1 capsule (500 mg total) by mouth 2 (two) times daily.   docusate sodium 100 MG capsule Commonly known as:  COLACE Take 1 capsule (100 mg total) by mouth every 12 (twelve) hours.   fluocinonide cream 0.05 % Commonly known as:  LIDEX APPLY TO AFFECTED AREA TWICE DAILY   fluticasone 50 MCG/ACT nasal spray Commonly known as:  FLONASE INSTILL 2 SPRAYS IN EACH NOSTRIL DAILY   furosemide 40 MG tablet Commonly known as:  LASIX Take 1 tablet (40 mg total) by mouth daily.   hydrOXYzine 25 MG tablet Commonly known as:  ATARAX/VISTARIL Take 1 tablet (25 mg total) by mouth at bedtime.   losartan 100 MG tablet Commonly known as:  COZAAR Take 1 tablet (100 mg total) by mouth daily.   oxybutynin 5 MG 24 hr tablet Commonly known as:  DITROPAN-XL Take 1 tablet (5 mg total) by mouth at bedtime.   pantoprazole 20 MG tablet Commonly known as:  PROTONIX Take 1 tablet (20 mg total) by mouth 2 (two) times daily.   potassium chloride 10 MEQ CR capsule Commonly known as:  MICRO-K Take 1 capsule (10 mEq total) by mouth daily.   traMADol 50 MG tablet Commonly known as:  ULTRAM Take 1 tablet (50 mg total) by mouth 2 (two) times daily as needed.       Allergies:  Allergies  Allergen Reactions  . Aspirin Other (See Comments)    Bleeding from vagina   . Sulfamethoxazole-Trimethoprim Itching    Family History: Family History  Problem Relation Age of Onset  . Diabetes Mother   . Heart disease Mother   . Hearing loss Mother   . Stroke Mother   . Post-traumatic stress disorder Father   . Schizophrenia Maternal Grandmother   . Diabetes Maternal Grandfather   . Other Brother        brother died in mental health hospital  . Kidney cancer Neg Hx   . Bladder Cancer Neg Hx     Social History:  reports that  she quit smoking about 4 years ago. Her smoking use included Cigarettes. She smoked 0.10 packs per day. She has never used smokeless tobacco. She reports that she does not drink alcohol or use drugs.  ROS: UROLOGY Frequent Urination?: Yes Hard to postpone urination?: No Burning/pain with urination?: No Get up at night to urinate?: Yes Leakage of urine?: Yes Urine stream starts and stops?: Yes Trouble starting stream?: No Do you have to strain to urinate?: No Blood in  urine?: No Urinary tract infection?: Yes Sexually transmitted disease?: No Injury to kidneys or bladder?: No Painful intercourse?: Yes Weak stream?: No Currently pregnant?: No Vaginal bleeding?: No Last menstrual period?: n  Gastrointestinal Nausea?: No Vomiting?: No Indigestion/heartburn?: No Diarrhea?: No Constipation?: No  Constitutional Fever: Yes Night sweats?: Yes Weight loss?: No Fatigue?: No  Skin Skin rash/lesions?: No Itching?: No  Eyes Blurred vision?: No Double vision?: No  Ears/Nose/Throat Sore throat?: No Sinus problems?: No  Hematologic/Lymphatic Swollen glands?: Yes Easy bruising?: Yes  Cardiovascular Leg swelling?: Yes Chest pain?: No  Respiratory Cough?: Yes Shortness of breath?: Yes  Endocrine Excessive thirst?: Yes  Musculoskeletal Back pain?: No Joint pain?: Yes  Neurological Headaches?: No Dizziness?: Yes  Psychologic Depression?: No Anxiety?: Yes  Physical Exam: BP 123/76   Pulse 96   Temp 98.2 F (36.8 C) (Oral)   Ht 5\' 3"  (1.6 m)   Wt 93.6 kg (206 lb 4.8 oz)   BMI 36.54 kg/m   Constitutional:  Alert and oriented, No acute distress. HEENT: [place] AT, moist mucus membranes.  Trachea midline, no masses. Cardiovascular: No clubbing, cyanosis, or edema. Respiratory: Normal respiratory effort, no increased work of breathing. GI: Abdomen is soft, nontender, nondistended, no abdominal masses GU: No CVA tenderness. Skin: No rashes, bruises or suspicious  lesions. Lymph: No cervical or inguinal adenopathy. Neurologic: Grossly intact, no focal deficits, moving all 4 extremities. Psychiatric: Normal mood and affect.  Laboratory Data: Lab Results  Component Value Date   WBC 4.5 04/08/2017   HGB 9.5 (L) 04/08/2017   HCT 30.3 (L) 04/08/2017   MCV 96.5 04/08/2017   PLT 228 04/08/2017    Lab Results  Component Value Date   CREATININE 1.52 (H) 04/08/2017    No results found for: PSA  No results found for: TESTOSTERONE  Lab Results  Component Value Date   HGBA1C 5.7 08/03/2016    Urinalysis    Component Value Date/Time   COLORURINE YELLOW 04/06/2017 1952   APPEARANCEUR HAZY (A) 04/06/2017 1952   LABSPEC 1.010 04/06/2017 1952   PHURINE 5.0 04/06/2017 1952   GLUCOSEU NEGATIVE 04/06/2017 1952   GLUCOSEU NEG mg/dL 11/01/2010 2152   HGBUR NEGATIVE 04/06/2017 1952   HGBUR trace-intact 11/28/2007 1531   BILIRUBINUR NEGATIVE 04/06/2017 1952   BILIRUBINUR NEGATIVE 04/06/2017 1648   KETONESUR NEGATIVE 04/06/2017 1952   PROTEINUR NEGATIVE 04/06/2017 1952   UROBILINOGEN 0.2 04/06/2017 1648   UROBILINOGEN 0.2 01/03/2015 1506   NITRITE POSITIVE (A) 04/06/2017 1952   LEUKOCYTESUR LARGE (A) 04/06/2017 1952    Pertinent Imaging: No recent upper tract imaging   Assessment & Plan:    Recurrent urinary tract infection-I discussed with the patient and her daughter the bacteria in the urine is not uncommon in postmenopausal females. True symptomatic urinary tract infections can wax and wane as she had in 2014 but settled down until recently. We discussed per ID guidelines prophylactic antibiotics do not prevent future symptomatic UTI might promote resistance. We could try prophylactic antibodies for short time to break the cycle or consider self start antibiotics. We'll also discussed the importance of proper fluid intake to keep the bladder flushed out as well as the possible benefit of probiotics. She has not had recent urinary tract imaging  and will screen the GU tract with a renal ultrasound and a cystoscopy as she saw gross hematuria and was a smoker.   Will start cephalexin 250 mg po QHS x 14 days to allow her bladder to recover from the inflammation, eval with  renal/bladder US and cysto.    There are no diagnoses linked to this encounter.  No Follow-up on file.  Festus Aloe, [facility] [address], [place], [place] [address] [phone]

## 2017-04-10 NOTE — Telephone Encounter (Signed)
Thank you Gorica. I appreciate the follow up

## 2017-04-10 NOTE — Telephone Encounter (Signed)
Patient's daughter called to obtain results of urine culture from recent hospitalization for UTI.   Patient was discharged on Keflex to complete full 5 day course of antibiotics which she is scheduled to complete today - daughter confirms this. Urine cultures from both the clinic and hospital samples grew E coli that was resistant to bactrim and ampicillin, making keflex an appropriate therapy for this patient.  Daughter stated that she made an appointment for patient with a urologist and also wanted to know what IV antibiotic her mother was on while in the hospital and I let her know that she received 2 doses of IV ceftriaxone 1g.   She had no further questions or concerns. I let her know to call the clinic or schedule an appointment if anything were to come up or patient began showing symptoms of infection again.   Alphonzo Grieve, MD IMTS - PGY1 Pager [phone]

## 2017-04-10 NOTE — Telephone Encounter (Signed)
[name] PT [facility] called requesting verbal orders for this patient in reguards to skilled nursing to assistant in medication management along with daily tasks, was also letting us know she was recently hospitalized for ongoing UTI's,  Attempted to call her back to verify the orders and got voicemail, left message to have her call back

## 2017-04-10 NOTE — Telephone Encounter (Signed)
Pt daughter requesting a note for work, stating she was with her mother to the appt on Friday. Please call back.

## 2017-04-11 ENCOUNTER — Encounter: Payer: Self-pay | Admitting: Internal Medicine

## 2017-04-11 ENCOUNTER — Encounter: Payer: Self-pay | Admitting: Physical Medicine & Rehabilitation

## 2017-04-11 ENCOUNTER — Encounter: Payer: PPO | Attending: Physical Medicine & Rehabilitation | Admitting: Physical Medicine & Rehabilitation

## 2017-04-11 VITALS — BP 114/70 | HR 85

## 2017-04-11 DIAGNOSIS — N390 Urinary tract infection, site not specified: Secondary | ICD-10-CM

## 2017-04-11 DIAGNOSIS — K219 Gastro-esophageal reflux disease without esophagitis: Secondary | ICD-10-CM | POA: Diagnosis not present

## 2017-04-11 DIAGNOSIS — Z96643 Presence of artificial hip joint, bilateral: Secondary | ICD-10-CM | POA: Insufficient documentation

## 2017-04-11 DIAGNOSIS — Z9889 Other specified postprocedural states: Secondary | ICD-10-CM | POA: Diagnosis not present

## 2017-04-11 DIAGNOSIS — Z87891 Personal history of nicotine dependence: Secondary | ICD-10-CM | POA: Diagnosis not present

## 2017-04-11 DIAGNOSIS — Z823 Family history of stroke: Secondary | ICD-10-CM | POA: Diagnosis not present

## 2017-04-11 DIAGNOSIS — Z833 Family history of diabetes mellitus: Secondary | ICD-10-CM | POA: Diagnosis not present

## 2017-04-11 DIAGNOSIS — Z818 Family history of other mental and behavioral disorders: Secondary | ICD-10-CM | POA: Insufficient documentation

## 2017-04-11 DIAGNOSIS — Z8541 Personal history of malignant neoplasm of cervix uteri: Secondary | ICD-10-CM | POA: Diagnosis not present

## 2017-04-11 DIAGNOSIS — I10 Essential (primary) hypertension: Secondary | ICD-10-CM

## 2017-04-11 DIAGNOSIS — Z8249 Family history of ischemic heart disease and other diseases of the circulatory system: Secondary | ICD-10-CM | POA: Diagnosis not present

## 2017-04-11 DIAGNOSIS — F329 Major depressive disorder, single episode, unspecified: Secondary | ICD-10-CM | POA: Insufficient documentation

## 2017-04-11 DIAGNOSIS — M109 Gout, unspecified: Secondary | ICD-10-CM | POA: Insufficient documentation

## 2017-04-11 DIAGNOSIS — E785 Hyperlipidemia, unspecified: Secondary | ICD-10-CM | POA: Insufficient documentation

## 2017-04-11 DIAGNOSIS — Z90710 Acquired absence of both cervix and uterus: Secondary | ICD-10-CM | POA: Insufficient documentation

## 2017-04-11 DIAGNOSIS — Z8543 Personal history of malignant neoplasm of ovary: Secondary | ICD-10-CM | POA: Diagnosis not present

## 2017-04-11 DIAGNOSIS — Z8673 Personal history of transient ischemic attack (TIA), and cerebral infarction without residual deficits: Secondary | ICD-10-CM | POA: Diagnosis not present

## 2017-04-11 DIAGNOSIS — M5137 Other intervertebral disc degeneration, lumbosacral region: Secondary | ICD-10-CM | POA: Diagnosis not present

## 2017-04-11 DIAGNOSIS — R42 Dizziness and giddiness: Secondary | ICD-10-CM | POA: Insufficient documentation

## 2017-04-11 DIAGNOSIS — I341 Nonrheumatic mitral (valve) prolapse: Secondary | ICD-10-CM | POA: Insufficient documentation

## 2017-04-11 DIAGNOSIS — R4182 Altered mental status, unspecified: Secondary | ICD-10-CM | POA: Diagnosis not present

## 2017-04-11 DIAGNOSIS — R600 Localized edema: Secondary | ICD-10-CM | POA: Diagnosis not present

## 2017-04-11 DIAGNOSIS — R509 Fever, unspecified: Secondary | ICD-10-CM | POA: Diagnosis not present

## 2017-04-11 DIAGNOSIS — N183 Chronic kidney disease, stage 3 (moderate): Secondary | ICD-10-CM | POA: Diagnosis not present

## 2017-04-11 DIAGNOSIS — S065X1S Traumatic subdural hemorrhage with loss of consciousness of 30 minutes or less, sequela: Secondary | ICD-10-CM

## 2017-04-11 DIAGNOSIS — I129 Hypertensive chronic kidney disease with stage 1 through stage 4 chronic kidney disease, or unspecified chronic kidney disease: Secondary | ICD-10-CM | POA: Insufficient documentation

## 2017-04-11 DIAGNOSIS — I872 Venous insufficiency (chronic) (peripheral): Secondary | ICD-10-CM | POA: Diagnosis not present

## 2017-04-11 LAB — CULTURE, BLOOD (ROUTINE X 2)
Culture: NO GROWTH
Culture: NO GROWTH
Special Requests: ADEQUATE
Special Requests: ADEQUATE

## 2017-04-11 MED ORDER — FUROSEMIDE 40 MG PO TABS: 20.0000 mg | ORAL_TABLET | Freq: Every day | ORAL | 3 refills | Status: DC

## 2017-04-11 MED ORDER — AMLODIPINE BESYLATE 10 MG PO TABS: 5.0000 mg | ORAL_TABLET | Freq: Every day | ORAL | 3 refills | Status: DC

## 2017-04-11 NOTE — Telephone Encounter (Signed)
Called back, someone answered but never said hello or responded to my hello, will try again later

## 2017-04-11 NOTE — Telephone Encounter (Signed)
Dr Software engineer, spoke w/ pt's daughter, she needs a note for work just stating that she brought pt to [facility] and was with pt as she was treated, she needs this for work and is in jeopardy of losing her job, she also states she is on pt's HIPPA form as pt's person to speak with.

## 2017-04-11 NOTE — Telephone Encounter (Signed)
Asking to speak with [name]. Please call back.  

## 2017-04-11 NOTE — Patient Instructions (Signed)
1. HOLD OXYBUTYNIN FOR NOW UNTIL YOU SEE DR. Junious Silk  2. ELEVATE LEGS WHILE IN BED AND WHILE SEATED  3. RESUME LASIX AT 20MG  DAILY  4. DECREASE AMLODIPINE TO 5MG  DAILY  5. WEAR TEDS    PLEASE FEEL FREE TO CALL OUR OFFICE WITH ANY PROBLEMS OR QUESTIONS ([phone])

## 2017-04-11 NOTE — Progress Notes (Signed)
Subjective:    Patient ID: DODI LEU, female    DOB: 1941-03-11, 76 y.o.   MRN: [id number]  HPI  This is a rehab follow up visit for Mrs. Chakraborty and her SDH. She was discharged home and has been receiving [place] since last month. About a week ago, however, she developed fever and AMS. She was sent to the ED and found to have a UTI/urosepsis. She was admitted briefly to [facility]. She is back home with her daughter and doing well. Her biggest complaint has been swelling in both feet. We had backed of lasix while in the hospital because of her renal status. I believe she was advised to resume again recently, but the daughter was hesitant to resume.   They are seeing urology regarding the UTI's.  It sounds as if an U/S is scheduled. She remains on ditropan for her bladder but daughter states she never discussed this with the dr. Junious Silk.   Patient is having minimal pain. Her balance and walking is improving. She is using a walker around the house. There have not been any falls. BP's have been under tight control.   Sleep has been fair to reasonable although she frequently has had difficulties at night in the past due to temperature, environmental factors.    Pain Inventory Average Pain 2 Pain Right Now 0 My pain is .  In the last 24 hours, has pain interfered with the following? General activity 2 Relation with others 0 Enjoyment of life 2 What TIME of day is your pain at its worst? . Sleep (in general) Poor  Pain is worse with: walking and standing Pain improves with: rest, therapy/exercise and pacing activities Relief from Meds: 5  Mobility use a walker ability to climb steps?  no do you drive?  no  Function I need assistance with the following:  bathing, meal prep, household duties and shopping  Neuro/Psych dizziness  Prior Studies Any changes since last visit?  no  Physicians involved in your care Any changes since last visit?  no   Family History  Problem  Relation Age of Onset  . Diabetes Mother   . Heart disease Mother   . Hearing loss Mother   . Stroke Mother   . Post-traumatic stress disorder Father   . Schizophrenia Maternal Grandmother   . Diabetes Maternal Grandfather   . Other Brother        brother died in mental health hospital  . Kidney cancer Neg Hx   . Bladder Cancer Neg Hx    Social History   Social History  . Marital status: Widowed    Spouse name: N/A  . Number of children: 3  . Years of education: 15   Occupational History  . Retired    Social History Main Topics  . Smoking status: Former Smoker    Packs/day: 0.10    Types: Cigarettes    Quit date: 04/08/2013  . Smokeless tobacco: Never Used  . Alcohol use No  . Drug use: No  . Sexual activity: No   Other Topics Concern  . None   Social History Narrative   Lives with daughter in [place]. Daughter is primary caretaker, surrogate decision-maker, and arranges for [facility] aide and other caregivers to supervise Ms. Cargile at home. Ambulatory   Past Surgical History:  Procedure Laterality Date  . ABDOMINAL HYSTERECTOMY     1972  . BURR HOLE Bilateral 02/12/2017   Procedure: Haskell Flirt;  Surgeon: Earnie Larsson, MD;  Location: [facility];  Service: Neurosurgery;  Laterality: Bilateral;  . CERVICAL DISCECTOMY  7/07   C5-C6  . JOINT REPLACEMENT     bilateral hip replacement  . LUMBAR DISC SURGERY     L5-S1 7/07  . LUMBAR LAMINECTOMY/DECOMPRESSION MICRODISCECTOMY N/A 02/02/2016   Procedure: L4-5 Decompression;  Surgeon: Marybelle Killings, MD;  Location: [facility];  Service: Orthopedics;  Laterality: N/A;  . [place] for ovarian cancer   Past Medical History:  Diagnosis Date  . Chronic venous insufficiency   . Colon, diverticulosis Sept. 2011   outpatient colonoscopy by Dr. Cristina Gong.  Need record  . DDD (degenerative disc disease), lumbosacral   . Depression   . DJD (degenerative joint disease), multiple sites    Low back pain worst  . GERD  (gastroesophageal reflux disease)   . Gout   . Gout    Uric Acid level 4.2 on 300 allopurinol  . History of cervical cancer 1972  . Hx-TIA (transient ischemic attack) 05/13/2001   Right facial numbness  . Hyperlipidemia   . Hypertension   . Memory disorder 12/20/2016  . Mitral valve prolapse 10/27/1999   Subsequent 2D echo show normal mitral valve  . Osteoarthritis of hip    bilateral hips  . Ovarian cancer ([facility]) 1972   S/P oophorectomy  . Psychosis   . Recurrent boils    History of MRSA skin infections with abscess  . Sensorineural hearing loss of both ears   . Subdural hematoma ([facility]) 01/26/2017   bilateral   BP 114/70   Pulse 85   SpO2 93%   Opioid Risk Score:    Fall Risk Score:  `1  Depression screen PHQ 2/9  Depression screen [facility] 2/9 04/11/2017 03/29/2017 01/04/2017 11/02/2016 08/03/2016 05/03/2016 07/08/2015  Decreased Interest 1 0 0 0 0 0 0  Down, Depressed, Hopeless 0 1 0 0 1 0 1  PHQ - 2 Score 1 1 0 0 1 0 1  Altered sleeping 3 - - - - - -  Tired, decreased energy 0 - - - - - -  Change in appetite 0 - - - - - -  Feeling bad or failure about yourself  0 - - - - - -  Trouble concentrating 0 - - - - - -  Moving slowly or fidgety/restless 1 - - - - - -  Suicidal thoughts 0 - - - - - -  PHQ-9 Score 5 - - - - - -  Difficult doing work/chores Extremely dIfficult - - - - - -  Some recent data might be hidden    Review of Systems  Constitutional: Positive for diaphoresis and fever.  Respiratory: Positive for cough.   Genitourinary: Positive for difficulty urinating.  Musculoskeletal: Positive for joint swelling.       Objective:   Physical Exam  Constitutional: She appears well-developed.  HENT:  Head: Normocephalic.  Eyes: EOMI. No swelling. anicteric Neck: Normal range of motion. Neck supple. No tracheal deviationpresent. No thyromegalypresent.  Cardiovascular:  RRR with bilateral 1+ edema pretibial Respiratory: CTA Bilaterally without wheezes or rales. Normal  effort EX:NTZG NT/ND, BS+  GU- labia majora and labia minora without external lesions, urethral meatus without blood, no vaginal discharge Skin. Warm and dry Neurological: She is alert and oriented x 3. HOH  Motor: B/l UE 5/5 proximal to distal. Gait slightly shuffling B/l LE: 4+/5 HF, 4+/5 Add, 4+/5 Hip Abd, KE, 5/5 ADF/PF  Sensation intact Psych: pleasant and cooperative. A  little quiet today       Assessment & Plan:  1. Decreased functional mobilitysecondary to acute on chronic left traumatic SDH status post bilateral frontal burr hole 02/12/2017    -Continue with HH PT, OT, SLP---has made nice neurological progress  -consider progression to outpt as well 2. Mood: Wellbutrin 300 mg daily, hydroxyzine 25 mg daily at bedtime 3. Skin/Wound Care: Routine skin checks48.Hypertension. Norvasc 10 mg daily--decrease to 5mg  daily  -Lasix 20 mg daily--resumed for edema,   -continue zaar 100 mg daily.  4.Gout. Allopurinol. Monitor for any gout flareups 5. Recent UTI/urosepsis  -urology follow up with U/S  -continued abx  -hold ditropan for now given likely retention  -encouraged adequate fluids 6 Acute on Chronic kidney disease              Thirty minutes of face to face patient care time were spent during this visit. All questions were encouraged and answered. Greater than 50% of time during this encounter was spent counseling patient/family in regard to urinary issues, htn control, rehab considerations. [name]

## 2017-04-12 ENCOUNTER — Inpatient Hospital Stay: Payer: Self-pay | Admitting: Internal Medicine

## 2017-04-12 NOTE — Telephone Encounter (Signed)
Have spoken to daughter she rtc this am

## 2017-04-12 NOTE — Telephone Encounter (Signed)
Left approval on verified voicemail.

## 2017-04-12 NOTE — Telephone Encounter (Signed)
I can write... [name] was [facility] on [facility]th. The physician who evaluated her documented that her daughter was present. [name] was admitted to the hospital on June 8th and discharged on June 10th. Notes from each day indicate that her daughter was present at the bedside each day."  Will that suffice?

## 2017-04-17 ENCOUNTER — Other Ambulatory Visit: Payer: Self-pay | Admitting: Internal Medicine

## 2017-04-17 ENCOUNTER — Telehealth: Payer: Self-pay | Admitting: Internal Medicine

## 2017-04-17 ENCOUNTER — Other Ambulatory Visit (HOSPITAL_COMMUNITY): Payer: Self-pay | Admitting: Psychiatry

## 2017-04-17 DIAGNOSIS — F200 Paranoid schizophrenia: Secondary | ICD-10-CM

## 2017-04-17 DIAGNOSIS — M545 Low back pain, unspecified: Secondary | ICD-10-CM

## 2017-04-17 NOTE — Telephone Encounter (Signed)
[facility] VO requested for PT from [employer].  Please call him back.

## 2017-04-17 NOTE — Telephone Encounter (Signed)
Lm for rtc 

## 2017-04-18 NOTE — Telephone Encounter (Signed)
Lm for wayne

## 2017-04-18 NOTE — Telephone Encounter (Signed)
I am not refilling tramadol for several reasons 1. Recent SDH and burr hole. Tramadol reduces seizure threshold 2. From H&P "Reports multiple falls since discharge from hospital (too many to count") as well as generalized weakness and unsteadiness." 3. Got Hydrocoone from Dr Jannifer Franklin as outpt 3/30 for back pain 4. Got alprazolam 5/20 PA ANGIULLI 5. Got hydrocodone 5/20 Dr Annette Stable

## 2017-04-23 ENCOUNTER — Telehealth: Payer: Self-pay

## 2017-04-23 NOTE — Telephone Encounter (Signed)
Return call made to [facility] with [facility]-no answer-message left recorder.Despina Hidden Cassady6/25/20182:53 PM

## 2017-04-23 NOTE — Telephone Encounter (Signed)
[place] with [facility] requesting VO. Please call back.

## 2017-04-24 NOTE — Telephone Encounter (Signed)
VO OK with me

## 2017-04-24 NOTE — Telephone Encounter (Signed)
Call from [place] ([place]).He is requesting a verbal order to see patient one more time to make up for missed visit from last week.  Verbal ok given-paperwork will follow for MD signature.Despina Hidden Cassady6/26/20183:05 PM  Will make pcp aware of order.

## 2017-04-29 DIAGNOSIS — F329 Major depressive disorder, single episode, unspecified: Secondary | ICD-10-CM | POA: Diagnosis not present

## 2017-04-29 DIAGNOSIS — N183 Chronic kidney disease, stage 3 (moderate): Secondary | ICD-10-CM | POA: Diagnosis not present

## 2017-04-29 DIAGNOSIS — S065X0D Traumatic subdural hemorrhage without loss of consciousness, subsequent encounter: Secondary | ICD-10-CM | POA: Diagnosis not present

## 2017-04-29 DIAGNOSIS — T380X5D Adverse effect of glucocorticoids and synthetic analogues, subsequent encounter: Secondary | ICD-10-CM | POA: Diagnosis not present

## 2017-04-29 DIAGNOSIS — Z79891 Long term (current) use of opiate analgesic: Secondary | ICD-10-CM | POA: Diagnosis not present

## 2017-04-29 DIAGNOSIS — K219 Gastro-esophageal reflux disease without esophagitis: Secondary | ICD-10-CM | POA: Diagnosis not present

## 2017-04-29 DIAGNOSIS — W19XXXD Unspecified fall, subsequent encounter: Secondary | ICD-10-CM | POA: Diagnosis not present

## 2017-04-29 DIAGNOSIS — E785 Hyperlipidemia, unspecified: Secondary | ICD-10-CM | POA: Diagnosis not present

## 2017-04-29 DIAGNOSIS — M109 Gout, unspecified: Secondary | ICD-10-CM | POA: Diagnosis not present

## 2017-04-29 DIAGNOSIS — Z96643 Presence of artificial hip joint, bilateral: Secondary | ICD-10-CM | POA: Diagnosis not present

## 2017-04-29 DIAGNOSIS — R739 Hyperglycemia, unspecified: Secondary | ICD-10-CM | POA: Diagnosis not present

## 2017-04-29 DIAGNOSIS — I129 Hypertensive chronic kidney disease with stage 1 through stage 4 chronic kidney disease, or unspecified chronic kidney disease: Secondary | ICD-10-CM | POA: Diagnosis not present

## 2017-04-29 DIAGNOSIS — K2900 Acute gastritis without bleeding: Secondary | ICD-10-CM | POA: Diagnosis not present

## 2017-04-29 DIAGNOSIS — D62 Acute posthemorrhagic anemia: Secondary | ICD-10-CM | POA: Diagnosis not present

## 2017-04-29 DIAGNOSIS — I872 Venous insufficiency (chronic) (peripheral): Secondary | ICD-10-CM | POA: Diagnosis not present

## 2017-04-30 ENCOUNTER — Telehealth: Payer: Self-pay

## 2017-04-30 DIAGNOSIS — I129 Hypertensive chronic kidney disease with stage 1 through stage 4 chronic kidney disease, or unspecified chronic kidney disease: Secondary | ICD-10-CM | POA: Diagnosis not present

## 2017-04-30 DIAGNOSIS — T380X5D Adverse effect of glucocorticoids and synthetic analogues, subsequent encounter: Secondary | ICD-10-CM | POA: Diagnosis not present

## 2017-04-30 DIAGNOSIS — F329 Major depressive disorder, single episode, unspecified: Secondary | ICD-10-CM | POA: Diagnosis not present

## 2017-04-30 DIAGNOSIS — E785 Hyperlipidemia, unspecified: Secondary | ICD-10-CM | POA: Diagnosis not present

## 2017-04-30 DIAGNOSIS — R739 Hyperglycemia, unspecified: Secondary | ICD-10-CM | POA: Diagnosis not present

## 2017-04-30 DIAGNOSIS — K2900 Acute gastritis without bleeding: Secondary | ICD-10-CM | POA: Diagnosis not present

## 2017-04-30 DIAGNOSIS — N183 Chronic kidney disease, stage 3 (moderate): Secondary | ICD-10-CM | POA: Diagnosis not present

## 2017-04-30 DIAGNOSIS — W19XXXD Unspecified fall, subsequent encounter: Secondary | ICD-10-CM | POA: Diagnosis not present

## 2017-04-30 DIAGNOSIS — Z96643 Presence of artificial hip joint, bilateral: Secondary | ICD-10-CM | POA: Diagnosis not present

## 2017-04-30 DIAGNOSIS — K219 Gastro-esophageal reflux disease without esophagitis: Secondary | ICD-10-CM | POA: Diagnosis not present

## 2017-04-30 DIAGNOSIS — Z79891 Long term (current) use of opiate analgesic: Secondary | ICD-10-CM | POA: Diagnosis not present

## 2017-04-30 DIAGNOSIS — M109 Gout, unspecified: Secondary | ICD-10-CM | POA: Diagnosis not present

## 2017-04-30 DIAGNOSIS — D62 Acute posthemorrhagic anemia: Secondary | ICD-10-CM | POA: Diagnosis not present

## 2017-04-30 DIAGNOSIS — S065X0D Traumatic subdural hemorrhage without loss of consciousness, subsequent encounter: Secondary | ICD-10-CM | POA: Diagnosis not present

## 2017-04-30 DIAGNOSIS — I872 Venous insufficiency (chronic) (peripheral): Secondary | ICD-10-CM | POA: Diagnosis not present

## 2017-04-30 NOTE — Telephone Encounter (Signed)
Called, they are in bank at this time, will rtc

## 2017-04-30 NOTE — Telephone Encounter (Signed)
Needs to speak with a nurse about meds. Please call back.  

## 2017-05-01 ENCOUNTER — Telehealth: Payer: Self-pay | Admitting: Physical Medicine & Rehabilitation

## 2017-05-01 ENCOUNTER — Ambulatory Visit
Admission: RE | Admit: 2017-05-01 | Discharge: 2017-05-01 | Disposition: A | Discharge status: Home / Self Care | Payer: PPO | Source: Ambulatory Visit | Attending: Urology | Admitting: Urology

## 2017-05-01 ENCOUNTER — Other Ambulatory Visit: Payer: Self-pay | Admitting: *Deleted

## 2017-05-01 DIAGNOSIS — N390 Urinary tract infection, site not specified: Secondary | ICD-10-CM | POA: Insufficient documentation

## 2017-05-01 DIAGNOSIS — R319 Hematuria, unspecified: Secondary | ICD-10-CM | POA: Insufficient documentation

## 2017-05-01 DIAGNOSIS — N281 Cyst of kidney, acquired: Secondary | ICD-10-CM | POA: Insufficient documentation

## 2017-05-01 NOTE — Telephone Encounter (Signed)
Pt and daughter came in went over meds with them, they also brought FMLA paperwork and healthcare power of atty- referred to front office

## 2017-05-01 NOTE — Telephone Encounter (Signed)
Saw pt and daughter today, med problems resolved

## 2017-05-01 NOTE — Telephone Encounter (Signed)
Patients daughter dropped off intermittent fmla paperwork ([name] )  and health care poa and advanced directives - I advised that they were completed with white out and we would need to get another clean copy no corrections.  I will forward documents to HIM's but not sure they would be accepted.  Gave her brand new forms to fill out - intraoffice doc's to HIM

## 2017-05-02 ENCOUNTER — Encounter: Payer: Self-pay | Admitting: Internal Medicine

## 2017-05-07 ENCOUNTER — Telehealth: Payer: Self-pay

## 2017-05-07 ENCOUNTER — Encounter: Payer: Self-pay | Admitting: Urology

## 2017-05-07 ENCOUNTER — Encounter: Payer: Self-pay | Admitting: Physical Medicine & Rehabilitation

## 2017-05-07 ENCOUNTER — Ambulatory Visit (INDEPENDENT_AMBULATORY_CARE_PROVIDER_SITE_OTHER): Payer: PPO | Admitting: Urology

## 2017-05-07 ENCOUNTER — Other Ambulatory Visit (HOSPITAL_COMMUNITY): Payer: Self-pay | Admitting: Internal Medicine

## 2017-05-07 VITALS — BP 112/62 | HR 87 | Ht 63.0 in | Wt 210.7 lb

## 2017-05-07 DIAGNOSIS — N390 Urinary tract infection, site not specified: Secondary | ICD-10-CM

## 2017-05-07 DIAGNOSIS — F200 Paranoid schizophrenia: Secondary | ICD-10-CM

## 2017-05-07 LAB — URINALYSIS, COMPLETE
Bilirubin, UA: NEGATIVE
Glucose, UA: NEGATIVE
Ketones, UA: NEGATIVE
Leukocytes, UA: NEGATIVE
Nitrite, UA: NEGATIVE
Protein, UA: NEGATIVE
RBC, UA: NEGATIVE
Specific Gravity, UA: <1.005 — ABNORMAL LOW (ref 1.005–1.030)
Urobilinogen, Ur: 0.2 mg/dL (ref 0.2–1.0)
pH, UA: 5.5 (ref 5.0–7.5)

## 2017-05-07 LAB — MICROSCOPIC EXAMINATION
Bacteria, UA: NONE SEEN
RBC, UA: NONE SEEN /hpf (ref 0–?)
WBC, UA: NONE SEEN /hpf (ref 0–?)

## 2017-05-07 MED ORDER — CIPROFLOXACIN HCL 500 MG PO TABS
500.0000 mg | ORAL_TABLET | Freq: Once | ORAL | Status: AC
  Administered 2017-05-07: 500 mg via ORAL

## 2017-05-07 MED ORDER — LIDOCAINE HCL 2 % EX GEL
1.0000 "application " | Freq: Once | CUTANEOUS | Status: AC
  Administered 2017-05-07: 1 via URETHRAL

## 2017-05-07 NOTE — Telephone Encounter (Signed)
-----   Message from Festus Aloe, MD sent at 05/06/2017  9:08 AM EDT ----- Notify patient / daughter - renal US was normal - no worrisome findings.   ----- Message ----- From: Royanne Foots, [place] Sent: 05/03/2017   9:54 AM To: Festus Aloe, MD    ----- Message ----- From: Interface, Rad Results In Sent: 05/01/2017   6:26 PM To: Rowe Robert Clinical

## 2017-05-07 NOTE — Progress Notes (Signed)
   05/07/17  CC:  Chief Complaint  Patient presents with  . Cysto    HPI:  Blood pressure 112/62, pulse 87, height 5\' 3"  (1.6 m), weight 95.6 kg (210 lb 11.2 oz). NED. A&Ox3.   No respiratory distress   Abd soft, NT, ND Normal external genitalia with patent urethral meatus  Cystoscopy Procedure Note  Patient identification was confirmed, informed consent was obtained, and patient was prepped using Betadine solution.  Lidocaine jelly was administered per urethral meatus.    Preoperative abx where received prior to procedure.    Procedure: - Flexible cystoscope introduced, without any difficulty.   - Thorough search of the bladder revealed:    normal urethral meatus    normal urothelium    no stones    no ulcers     no tumors    no urethral polyps    no trabeculation  - Ureteral orifices were normal in position and appearance.  Post-Procedure: - Patient tolerated the procedure well  Assessment/ Plan:  Patient's renal ultrasound was normal. Her cystoscopic evaluation is also normal. Her urine today is clear and her symptoms are improving. The patient needs no additional follow-up here. She can see is on an as-needed basis.  Ardis Hughs, MD

## 2017-05-07 NOTE — Telephone Encounter (Signed)
Patient's daughter notified on vmail 

## 2017-05-08 ENCOUNTER — Encounter (INDEPENDENT_AMBULATORY_CARE_PROVIDER_SITE_OTHER): Payer: Self-pay | Admitting: Orthopaedic Surgery

## 2017-05-08 ENCOUNTER — Telehealth: Payer: Self-pay | Admitting: *Deleted

## 2017-05-08 NOTE — Telephone Encounter (Signed)
Sherlynn Stalls, OT, [facility] left a message asking for verbal orders 1week2 to work on continue plan of care, medication management, and safe independent active daily living.  Verbal orders were given

## 2017-05-09 DIAGNOSIS — N183 Chronic kidney disease, stage 3 (moderate): Secondary | ICD-10-CM | POA: Diagnosis not present

## 2017-05-09 DIAGNOSIS — K219 Gastro-esophageal reflux disease without esophagitis: Secondary | ICD-10-CM | POA: Diagnosis not present

## 2017-05-09 DIAGNOSIS — T380X5D Adverse effect of glucocorticoids and synthetic analogues, subsequent encounter: Secondary | ICD-10-CM | POA: Diagnosis not present

## 2017-05-09 DIAGNOSIS — Z96643 Presence of artificial hip joint, bilateral: Secondary | ICD-10-CM | POA: Diagnosis not present

## 2017-05-09 DIAGNOSIS — R739 Hyperglycemia, unspecified: Secondary | ICD-10-CM | POA: Diagnosis not present

## 2017-05-09 DIAGNOSIS — F329 Major depressive disorder, single episode, unspecified: Secondary | ICD-10-CM | POA: Diagnosis not present

## 2017-05-09 DIAGNOSIS — I872 Venous insufficiency (chronic) (peripheral): Secondary | ICD-10-CM | POA: Diagnosis not present

## 2017-05-09 DIAGNOSIS — S065X0D Traumatic subdural hemorrhage without loss of consciousness, subsequent encounter: Secondary | ICD-10-CM | POA: Diagnosis not present

## 2017-05-09 DIAGNOSIS — I129 Hypertensive chronic kidney disease with stage 1 through stage 4 chronic kidney disease, or unspecified chronic kidney disease: Secondary | ICD-10-CM | POA: Diagnosis not present

## 2017-05-09 DIAGNOSIS — M109 Gout, unspecified: Secondary | ICD-10-CM | POA: Diagnosis not present

## 2017-05-09 DIAGNOSIS — E785 Hyperlipidemia, unspecified: Secondary | ICD-10-CM | POA: Diagnosis not present

## 2017-05-09 DIAGNOSIS — W19XXXD Unspecified fall, subsequent encounter: Secondary | ICD-10-CM | POA: Diagnosis not present

## 2017-05-09 DIAGNOSIS — Z79891 Long term (current) use of opiate analgesic: Secondary | ICD-10-CM | POA: Diagnosis not present

## 2017-05-09 DIAGNOSIS — D62 Acute posthemorrhagic anemia: Secondary | ICD-10-CM | POA: Diagnosis not present

## 2017-05-09 DIAGNOSIS — K2900 Acute gastritis without bleeding: Secondary | ICD-10-CM | POA: Diagnosis not present

## 2017-05-10 ENCOUNTER — Ambulatory Visit: Payer: Self-pay

## 2017-05-11 DIAGNOSIS — N183 Chronic kidney disease, stage 3 (moderate): Secondary | ICD-10-CM | POA: Diagnosis not present

## 2017-05-11 DIAGNOSIS — Z96643 Presence of artificial hip joint, bilateral: Secondary | ICD-10-CM | POA: Diagnosis not present

## 2017-05-11 DIAGNOSIS — I129 Hypertensive chronic kidney disease with stage 1 through stage 4 chronic kidney disease, or unspecified chronic kidney disease: Secondary | ICD-10-CM | POA: Diagnosis not present

## 2017-05-11 DIAGNOSIS — K2900 Acute gastritis without bleeding: Secondary | ICD-10-CM | POA: Diagnosis not present

## 2017-05-11 DIAGNOSIS — D62 Acute posthemorrhagic anemia: Secondary | ICD-10-CM | POA: Diagnosis not present

## 2017-05-11 DIAGNOSIS — S065X0D Traumatic subdural hemorrhage without loss of consciousness, subsequent encounter: Secondary | ICD-10-CM | POA: Diagnosis not present

## 2017-05-11 DIAGNOSIS — M109 Gout, unspecified: Secondary | ICD-10-CM | POA: Diagnosis not present

## 2017-05-11 DIAGNOSIS — R739 Hyperglycemia, unspecified: Secondary | ICD-10-CM | POA: Diagnosis not present

## 2017-05-11 DIAGNOSIS — T380X5D Adverse effect of glucocorticoids and synthetic analogues, subsequent encounter: Secondary | ICD-10-CM | POA: Diagnosis not present

## 2017-05-11 DIAGNOSIS — F329 Major depressive disorder, single episode, unspecified: Secondary | ICD-10-CM | POA: Diagnosis not present

## 2017-05-11 DIAGNOSIS — W19XXXD Unspecified fall, subsequent encounter: Secondary | ICD-10-CM | POA: Diagnosis not present

## 2017-05-11 DIAGNOSIS — E785 Hyperlipidemia, unspecified: Secondary | ICD-10-CM | POA: Diagnosis not present

## 2017-05-11 DIAGNOSIS — I872 Venous insufficiency (chronic) (peripheral): Secondary | ICD-10-CM | POA: Diagnosis not present

## 2017-05-11 DIAGNOSIS — Z79891 Long term (current) use of opiate analgesic: Secondary | ICD-10-CM | POA: Diagnosis not present

## 2017-05-11 DIAGNOSIS — K219 Gastro-esophageal reflux disease without esophagitis: Secondary | ICD-10-CM | POA: Diagnosis not present

## 2017-05-15 ENCOUNTER — Encounter: Payer: Self-pay | Admitting: Physical Medicine & Rehabilitation

## 2017-05-17 ENCOUNTER — Telehealth: Payer: Self-pay | Admitting: Physical Medicine & Rehabilitation

## 2017-05-17 ENCOUNTER — Encounter: Payer: Self-pay | Admitting: Physical Medicine & Rehabilitation

## 2017-05-17 NOTE — Telephone Encounter (Signed)
[name] called about her FMLA paperwork to see if it had been completed.  [name] said it was up front and ready to be picked up.  I explained to [name] that there would be a $25 charge for [facility] paperwork.  She stated no one charged me before and I explained to her that every patient has to pay $25 for those.  She said she doesn't get off until 4:30 and if we could fax them.  I again explained to her I could but we would have to get payment before we can.  [name] got irate with me and started using profanity.  I told her not to use profanity and to hold and I would get [place].

## 2017-05-17 NOTE — Telephone Encounter (Signed)
Patient was transferred to me by April because patients daughter [name] was belligerent and using profanity about being charge for [facility] records.  I took call she states she cannot be here before 4:30 must have come at lunch the last time - we are using bait and switch because we didn't tell her she had to pay.  I advised the health system charges for MR, ROI, FMLA etc she became extremely loud yellling at me that I was holding her paperwork hostage.  I advised I cannot tell her if we told her when she came the first time because she is not patient she is DPR for patient and the physicians corrected documentation twice based on her comments on form.  He deserves to be paid for his time, and she was not going to continue to yell at me.  She repeated I'm not yelling at you this is how I talk.  This went on for several minutes and I was advised I would fax her document - she needed to pay, and that I hoped I did not have to be treated this way and speak to her in the future. I faxed documents to [employer].

## 2017-05-28 DIAGNOSIS — K219 Gastro-esophageal reflux disease without esophagitis: Secondary | ICD-10-CM | POA: Diagnosis not present

## 2017-05-28 DIAGNOSIS — T380X5D Adverse effect of glucocorticoids and synthetic analogues, subsequent encounter: Secondary | ICD-10-CM

## 2017-05-28 DIAGNOSIS — Z79891 Long term (current) use of opiate analgesic: Secondary | ICD-10-CM

## 2017-05-28 DIAGNOSIS — I129 Hypertensive chronic kidney disease with stage 1 through stage 4 chronic kidney disease, or unspecified chronic kidney disease: Secondary | ICD-10-CM | POA: Diagnosis not present

## 2017-05-28 DIAGNOSIS — W19XXXD Unspecified fall, subsequent encounter: Secondary | ICD-10-CM | POA: Diagnosis not present

## 2017-05-28 DIAGNOSIS — E785 Hyperlipidemia, unspecified: Secondary | ICD-10-CM | POA: Diagnosis not present

## 2017-05-28 DIAGNOSIS — D62 Acute posthemorrhagic anemia: Secondary | ICD-10-CM | POA: Diagnosis not present

## 2017-05-28 DIAGNOSIS — N183 Chronic kidney disease, stage 3 (moderate): Secondary | ICD-10-CM | POA: Diagnosis not present

## 2017-05-28 DIAGNOSIS — Z96643 Presence of artificial hip joint, bilateral: Secondary | ICD-10-CM

## 2017-05-28 DIAGNOSIS — R739 Hyperglycemia, unspecified: Secondary | ICD-10-CM | POA: Diagnosis not present

## 2017-05-28 DIAGNOSIS — I872 Venous insufficiency (chronic) (peripheral): Secondary | ICD-10-CM | POA: Diagnosis not present

## 2017-05-28 DIAGNOSIS — S065X0D Traumatic subdural hemorrhage without loss of consciousness, subsequent encounter: Secondary | ICD-10-CM | POA: Diagnosis not present

## 2017-05-28 DIAGNOSIS — K2900 Acute gastritis without bleeding: Secondary | ICD-10-CM | POA: Diagnosis not present

## 2017-05-28 DIAGNOSIS — M109 Gout, unspecified: Secondary | ICD-10-CM | POA: Diagnosis not present

## 2017-05-28 DIAGNOSIS — F329 Major depressive disorder, single episode, unspecified: Secondary | ICD-10-CM | POA: Diagnosis not present

## 2017-05-29 ENCOUNTER — Encounter (INDEPENDENT_AMBULATORY_CARE_PROVIDER_SITE_OTHER): Payer: Self-pay | Admitting: Orthopaedic Surgery

## 2017-05-29 ENCOUNTER — Ambulatory Visit (INDEPENDENT_AMBULATORY_CARE_PROVIDER_SITE_OTHER): Payer: PPO

## 2017-05-29 ENCOUNTER — Ambulatory Visit (INDEPENDENT_AMBULATORY_CARE_PROVIDER_SITE_OTHER): Payer: PPO | Admitting: Orthopaedic Surgery

## 2017-05-29 VITALS — BP 146/81 | HR 83 | Ht 63.0 in | Wt 210.0 lb

## 2017-05-29 DIAGNOSIS — M545 Low back pain: Secondary | ICD-10-CM

## 2017-05-29 NOTE — Progress Notes (Signed)
Office Visit Note   Patient: [name]           Date of Birth: Sep 12, 1941           MRN: [id number] Visit Date: 05/29/2017              Requested by: Bartholomew Crews, MD [address] [place], [place] [address] PCP: Bartholomew Crews, MD   Assessment & Plan: Visit Diagnoses:  1. Acute left-sided low back pain, with sciatica presence unspecified     Plan: Patient is to walk daily using a walker to prevent falling she does not need to get up and she uses her walker. She needs to walk until she gets tired sit and relax and then repeat her daily walking. We discussed fall prevention. She has tremor all for pain which works. She needs to elevate her feet when her leg swell I discussed with her using Lasix is not a good idea with her renal failure problems. I reviewed the x-rays with her over hips which show good position of her hips. This likely the some recurrent pain is related to her lumbar spine. She is neurologically intact and does not need repeat imaging at this time. Summer pain may be related to her falling. Fall prevention discussed.  Follow-Up Instructions: No Follow-up on file.   Orders:  Orders Placed This Encounter  Procedures  . XR Pelvis 1-2 Views  . XR Lumbar Spine 2-3 Views   No orders of the defined types were placed in this encounter.     Procedures: No procedures performed   Clinical Data: No additional findings.   Subjective: Chief Complaint  Patient presents with  . Lower Back - Pain  . Left Leg - Pain    HPI 76 year old female seen with rolling walker with bilateral groin pain. Onset was 7-18 on the left groin and states that now moved around to her back. She's had several falls and sometimes didn't use her walker. She had surgery for subdural bleed by Dr. [name]. She went home fell a few more times had to go back to inpatient care. She's had UTIs was on Lasix and has some swelling over lower extremities when she sits in a chair  most the day. We discussed intermittent elevation to help with her edema of her feet and ankles with her kidney disease. She's had bilateral total hip arthroplasties in the past and x-rays were obtained today. She denies associated bowel or bladder symptoms other than the UTI she had recently.  Review of Systems via systems positive for stage III kidney disease, chronic heart failure hypertension, GERD, reactive airway disease, history of gout, hyperlipidemia. Revision bilateral total hip arthroplasties. L4-5 decompression April 2017 by Dr. Lorin Mercy. Bur holes for subdural's and for/16/2008. Otherwise negative as it pertains to history of present illness   Objective: Vital Signs: BP (!) 146/81   Pulse 83   Ht 5\' 3"  (1.6 m)   Wt 210 lb (95.3 kg)   BMI 37.20 kg/m   Physical Exam  Constitutional: She is oriented to person, place, and time. She appears well-developed.  HENT:  Head: Normocephalic.  Right Ear: External ear normal.  Left Ear: External ear normal.  Eyes: Pupils are equal, round, and reactive to light.  Neck: No tracheal deviation present. No thyromegaly present.  Cardiovascular: Normal rate.   Pulmonary/Chest: Effort normal and breath sounds normal.  Abdominal: Soft.  Musculoskeletal:  Patient has no pain with hip range of motion she has  some trochanteric bursal tenderness some sciatic notch tenderness finger straight leg raising 90. Anterior tib EHL is intact she has mild pedal edema palpable distal pulses. Foot is warm. No calf tenderness compartments are soft. Knees reach full extension good flexion.  Neurological: She is alert and oriented to person, place, and time.  Skin: Skin is warm and dry.  Psychiatric: She has a normal mood and affect. Her behavior is normal.    Ortho Exam  Specialty Comments:  No specialty comments available.  Imaging: No results found.   PMFS History: Patient Active Problem List   Diagnosis Date Noted  . Fever 04/06/2017  . UTI (urinary  tract infection) 04/06/2017  . Lower extremity edema 03/29/2017  . Subdural hemorrhage following injury without open intracranial wound and with loss of consciousness of 30 minutes or less, sequela ([place]) 03/08/2017  . Fall   . TBI (traumatic brain injury) ([place])   . History of bilateral hip replacements   . Steroid-induced hyperglycemia   . Acute blood loss anemia   . Elevated blood pressure reading   . Stage 3 chronic kidney disease   . Subdural hematoma ([place]) 03/06/2017  . SDH (subdural hematoma) ([place]) 02/10/2017  . Subdural hematoma without coma ([place]) 02/09/2017  . Acute low back pain 02/04/2017  . Memory disorder 12/20/2016  . History of lumbar laminectomy for spinal cord decompression 02/02/2016  . Atopic dermatitis 07/09/2015  . Bereavement 12/01/2014  . Psychosis 12/01/2014  . Reactive airway disease 08/11/2014  . History of cancer 08/11/2014  . OSA (obstructive sleep apnea) 08/11/2014  . Healthcare maintenance 07/17/2013  . Chronic kidney disease (CKD), stage III (moderate) 10/04/2012  . Schizophrenia, chronic condition ([place]) 01/20/2012  . Postmenopausal atrophic vaginitis 11/01/2010  . Chronic diastolic heart failure ([place]) 05/06/2010  . Allergic rhinitis 10/14/2009  . Obesity, Class II, BMI 35-39.9, with comorbidity 05/14/2009  . Chronic prescription opiate use 04/29/2008  . Normocytic anemia 09/25/2007  . Hyperlipidemia 09/03/2006  . Gout 09/03/2006  . Essential hypertension 09/03/2006  . GERD 09/03/2006  . Hx-TIA (transient ischemic attack) 05/13/2001   Past Medical History:  Diagnosis Date  . Chronic venous insufficiency   . Colon, diverticulosis Sept. 2011   outpatient colonoscopy by Dr. Cristina Gong.  Need record  . DDD (degenerative disc disease), lumbosacral   . Depression   . DJD (degenerative joint disease), multiple sites    Low back pain worst  . GERD (gastroesophageal reflux disease)   . Gout   . Gout    Uric Acid level 4.2 on 300 allopurinol  .  History of cervical cancer 1972  . Hx-TIA (transient ischemic attack) 05/13/2001   Right facial numbness  . Hyperlipidemia   . Hypertension   . Memory disorder 12/20/2016  . Mitral valve prolapse 10/27/1999   Subsequent 2D echo show normal mitral valve  . Osteoarthritis of hip    bilateral hips  . Ovarian cancer ([place]) 1972   S/P oophorectomy  . Psychosis   . Recurrent boils    History of MRSA skin infections with abscess  . Sensorineural hearing loss of both ears   . Subdural hematoma ([place]) 01/26/2017   bilateral    Family History  Problem Relation Age of Onset  . Diabetes Mother   . Heart disease Mother   . Hearing loss Mother   . Stroke Mother   . Post-traumatic stress disorder Father   . Schizophrenia Maternal Grandmother   . Diabetes Maternal Grandfather   . Other Brother  brother died in mental health hospital  . Kidney cancer Neg Hx   . Bladder Cancer Neg Hx     Past Surgical History:  Procedure Laterality Date  . ABDOMINAL HYSTERECTOMY     1972  . BURR HOLE Bilateral 02/12/2017   Procedure: Haskell Flirt;  Surgeon: Earnie Larsson, MD;  Location: [place];  Service: Neurosurgery;  Laterality: Bilateral;  . CERVICAL DISCECTOMY  7/07   C5-C6  . JOINT REPLACEMENT     bilateral hip replacement  . LUMBAR DISC SURGERY     L5-S1 7/07  . LUMBAR LAMINECTOMY/DECOMPRESSION MICRODISCECTOMY N/A 02/02/2016   Procedure: L4-5 Decompression;  Surgeon: Marybelle Killings, MD;  Location: [place];  Service: Orthopedics;  Laterality: N/A;  . [place] for ovarian cancer   Social History   Occupational History  . Retired    Social History Main Topics  . Smoking status: Former Smoker    Packs/day: 0.10    Types: Cigarettes    Quit date: 04/08/2013  . Smokeless tobacco: Never Used  . Alcohol use No  . Drug use: No  . Sexual activity: No

## 2017-05-30 ENCOUNTER — Other Ambulatory Visit: Payer: Self-pay | Admitting: Internal Medicine

## 2017-05-30 ENCOUNTER — Encounter (INDEPENDENT_AMBULATORY_CARE_PROVIDER_SITE_OTHER): Payer: Self-pay | Admitting: Orthopaedic Surgery

## 2017-05-30 DIAGNOSIS — K2900 Acute gastritis without bleeding: Secondary | ICD-10-CM | POA: Diagnosis not present

## 2017-05-30 DIAGNOSIS — Z1231 Encounter for screening mammogram for malignant neoplasm of breast: Secondary | ICD-10-CM

## 2017-05-30 DIAGNOSIS — Z79891 Long term (current) use of opiate analgesic: Secondary | ICD-10-CM

## 2017-05-30 DIAGNOSIS — I872 Venous insufficiency (chronic) (peripheral): Secondary | ICD-10-CM | POA: Diagnosis not present

## 2017-05-30 DIAGNOSIS — T380X5D Adverse effect of glucocorticoids and synthetic analogues, subsequent encounter: Secondary | ICD-10-CM

## 2017-05-30 DIAGNOSIS — M109 Gout, unspecified: Secondary | ICD-10-CM | POA: Diagnosis not present

## 2017-05-30 DIAGNOSIS — F329 Major depressive disorder, single episode, unspecified: Secondary | ICD-10-CM | POA: Diagnosis not present

## 2017-05-30 DIAGNOSIS — I129 Hypertensive chronic kidney disease with stage 1 through stage 4 chronic kidney disease, or unspecified chronic kidney disease: Secondary | ICD-10-CM | POA: Diagnosis not present

## 2017-05-30 DIAGNOSIS — E785 Hyperlipidemia, unspecified: Secondary | ICD-10-CM | POA: Diagnosis not present

## 2017-05-30 DIAGNOSIS — R739 Hyperglycemia, unspecified: Secondary | ICD-10-CM | POA: Diagnosis not present

## 2017-05-30 DIAGNOSIS — D62 Acute posthemorrhagic anemia: Secondary | ICD-10-CM | POA: Diagnosis not present

## 2017-05-30 DIAGNOSIS — K219 Gastro-esophageal reflux disease without esophagitis: Secondary | ICD-10-CM | POA: Diagnosis not present

## 2017-05-30 DIAGNOSIS — Z96643 Presence of artificial hip joint, bilateral: Secondary | ICD-10-CM

## 2017-05-30 DIAGNOSIS — S065X0D Traumatic subdural hemorrhage without loss of consciousness, subsequent encounter: Secondary | ICD-10-CM | POA: Diagnosis not present

## 2017-05-30 DIAGNOSIS — W19XXXD Unspecified fall, subsequent encounter: Secondary | ICD-10-CM | POA: Diagnosis not present

## 2017-05-30 DIAGNOSIS — N183 Chronic kidney disease, stage 3 (moderate): Secondary | ICD-10-CM | POA: Diagnosis not present

## 2017-05-31 ENCOUNTER — Ambulatory Visit (INDEPENDENT_AMBULATORY_CARE_PROVIDER_SITE_OTHER): Payer: PPO | Admitting: Psychiatry

## 2017-05-31 ENCOUNTER — Encounter (HOSPITAL_COMMUNITY): Payer: Self-pay | Admitting: Psychiatry

## 2017-05-31 DIAGNOSIS — F419 Anxiety disorder, unspecified: Secondary | ICD-10-CM

## 2017-05-31 DIAGNOSIS — F200 Paranoid schizophrenia: Secondary | ICD-10-CM | POA: Diagnosis not present

## 2017-05-31 DIAGNOSIS — Z87891 Personal history of nicotine dependence: Secondary | ICD-10-CM

## 2017-05-31 DIAGNOSIS — Z818 Family history of other mental and behavioral disorders: Secondary | ICD-10-CM

## 2017-05-31 MED ORDER — HYDROXYZINE PAMOATE 25 MG PO CAPS: 25.0000 mg | ORAL_CAPSULE | Freq: Every day | ORAL | 2 refills | Status: DC

## 2017-05-31 MED ORDER — BUPROPION HCL ER (XL) 300 MG PO TB24: 300.0000 mg | ORAL_TABLET | Freq: Every morning | ORAL | 2 refills | Status: DC
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
## 2017-06-07 ENCOUNTER — Ambulatory Visit (INDEPENDENT_AMBULATORY_CARE_PROVIDER_SITE_OTHER): Payer: PPO | Admitting: Internal Medicine

## 2017-06-07 ENCOUNTER — Encounter: Payer: Self-pay | Admitting: Internal Medicine

## 2017-06-07 VITALS — BP 90/67 | HR 80 | Temp 98.3°F | Wt 216.2 lb

## 2017-06-07 DIAGNOSIS — D649 Anemia, unspecified: Secondary | ICD-10-CM | POA: Diagnosis not present

## 2017-06-07 DIAGNOSIS — N183 Chronic kidney disease, stage 3 unspecified: Secondary | ICD-10-CM

## 2017-06-07 DIAGNOSIS — I5032 Chronic diastolic (congestive) heart failure: Secondary | ICD-10-CM

## 2017-06-07 DIAGNOSIS — Z780 Asymptomatic menopausal state: Secondary | ICD-10-CM

## 2017-06-07 DIAGNOSIS — I130 Hypertensive heart and chronic kidney disease with heart failure and stage 1 through stage 4 chronic kidney disease, or unspecified chronic kidney disease: Secondary | ICD-10-CM | POA: Diagnosis not present

## 2017-06-07 DIAGNOSIS — G8929 Other chronic pain: Secondary | ICD-10-CM | POA: Diagnosis not present

## 2017-06-07 DIAGNOSIS — Z0000 Encounter for general adult medical examination without abnormal findings: Secondary | ICD-10-CM

## 2017-06-07 DIAGNOSIS — Z79899 Other long term (current) drug therapy: Secondary | ICD-10-CM

## 2017-06-07 DIAGNOSIS — I10 Essential (primary) hypertension: Secondary | ICD-10-CM

## 2017-06-07 DIAGNOSIS — IMO0001 Reserved for inherently not codable concepts without codable children: Secondary | ICD-10-CM

## 2017-06-07 DIAGNOSIS — Z79891 Long term (current) use of opiate analgesic: Secondary | ICD-10-CM

## 2017-06-07 DIAGNOSIS — E785 Hyperlipidemia, unspecified: Secondary | ICD-10-CM | POA: Diagnosis not present

## 2017-06-07 DIAGNOSIS — M545 Low back pain, unspecified: Secondary | ICD-10-CM

## 2017-06-07 DIAGNOSIS — R413 Other amnesia: Secondary | ICD-10-CM | POA: Diagnosis not present

## 2017-06-07 DIAGNOSIS — Z87891 Personal history of nicotine dependence: Secondary | ICD-10-CM

## 2017-06-07 DIAGNOSIS — E668 Other obesity: Secondary | ICD-10-CM | POA: Diagnosis not present

## 2017-06-07 DIAGNOSIS — R600 Localized edema: Secondary | ICD-10-CM

## 2017-06-07 DIAGNOSIS — F209 Schizophrenia, unspecified: Secondary | ICD-10-CM | POA: Diagnosis not present

## 2017-06-07 DIAGNOSIS — Z96643 Presence of artificial hip joint, bilateral: Secondary | ICD-10-CM

## 2017-06-07 DIAGNOSIS — Z6839 Body mass index (BMI) 39.0-39.9, adult: Secondary | ICD-10-CM | POA: Diagnosis not present

## 2017-06-07 DIAGNOSIS — R29890 Loss of height: Secondary | ICD-10-CM

## 2017-06-07 DIAGNOSIS — K5900 Constipation, unspecified: Secondary | ICD-10-CM | POA: Diagnosis not present

## 2017-06-07 MED ORDER — FUROSEMIDE 20 MG PO TABS: 20.0000 mg | ORAL_TABLET | Freq: Two times a day (BID) | ORAL | 2 refills | Status: DC | PRN

## 2017-06-07 MED ORDER — ATORVASTATIN CALCIUM 40 MG PO TABS: 40.0000 mg | ORAL_TABLET | Freq: Every day | ORAL | 3 refills | Status: DC

## 2017-06-07 MED ORDER — TRAMADOL HCL 50 MG PO TABS: 50.0000 mg | ORAL_TABLET | Freq: Two times a day (BID) | ORAL | 2 refills | Status: DC | PRN

## 2017-06-07 NOTE — Assessment & Plan Note (Signed)
This problem is chronic and worsening. Her baseline creatinine has been about 1.2-1.4. Most recent creatinine was 1.52. Her GFR is in the 30s. I reviewed with the daughter not to use nonsteroidals. We discussed that as long as her creatinine does not worsen, she could continue as is for the rest of her lites without difficulties. She had a renal ultrasound in July of this year which ruled out post renal causes. She is not on any nephrotoxic medications. She is not diabetic. Her blood pressure has been fairly well controlled. The etiology is not clear although her recent hospitalization seemed to worsen a bit.  PLAN : Continue to follow Avoid nephrotoxic agents Lasix cautiously with frequent creatinine monitoring

## 2017-06-07 NOTE — Assessment & Plan Note (Signed)
This problem is chronic and stable. Her medication regimen is Norvasc 10 and Cozaar 100. She checks her blood pressure at home and it is generally in the 120s or 130s. Her initial blood pressure here is 90/67 although there was some concern that it was an accurate pressure due to the cuff placement. Her repeat pressure was 130/57. I discussed that if she were to get dizzy or lightheaded that we would need to know so that we could cut back on her medication.  PLAN:  Cont current meds

## 2017-06-07 NOTE — Assessment & Plan Note (Signed)
This problem is chronic and stable. She is on pravastatin 40 daily for likely primary prevention. She does have a vague history of a TIA in 2012 although that is not clear. She is having no side effects to this medication. Her LDL was 92 in 2015. I see no need to repeat a lipid panel at this time  PLAN:  Cont current meds

## 2017-06-07 NOTE — Assessment & Plan Note (Signed)
This problem is chronic and stable. She continues to complain of pain that started in her upper legs and then went to her groin areas and around to her back. She has had chronic pain and is on tramadol for this although she seems to indicate that this pain is a little different than previously. She states that she cannot bend the legs because that makes it worse. She states it is all day long and cannot sleep in her bed because she cannot get her legs up into bed without assistance. Therefore she is sleeping in her recliner. Walking also makes the pain worse. She has seen her orthopedic doctor who ordered a pelvic and lower back films which showed changes consistent with L4-L5 decompression and stable bilateral hip arthroplasties. I reviewed orthopedics notes and he did not feel that her pain was due to the hip replacements but rather due to some condition in her back but that no further imaging was indicated. They encouraged her to continue to the ambulatory. I see no need for further imaging. I discussed with her daughter to avoid nonsteroidals. I am okay with cautious tramadol use and the patient states that she uses 1 or 2 a day. The daughter asked about acetaminophen and I stated that she could take up to 600 mg a day.  PLAN : Tramadol 1 or 2 a day Acetaminophen up to 3 g per day Avoid nonsteroidal medications

## 2017-06-07 NOTE — Assessment & Plan Note (Signed)
This problem is chronic and stable. She complains of lower extremity edema. This is not new but rather chronic. Last year, she responded well to Lasix. Her Lasix dose has been increased, decreased, or stopped recently due to renal function and hospitalization. Currently, the daughter is giving her 20 mg twice a day. This does not seem to be helping much with the lower extremity edema. I encouraged her to use her stockings and the daughter states they have a pair at home to use. I am checking a creatinine today to ensure that the Lasix has not worsened her renal function.  PLAN : May use up to 40 mg of Lasix a day as long as renal function does not worsen BMP today

## 2017-06-07 NOTE — Assessment & Plan Note (Signed)
The daughter questioned whether her mother had had a DEXA. I cannot find a DEXA results or the discussion regarding screening. We are in agreement to check DEXA today.

## 2017-06-07 NOTE — Assessment & Plan Note (Signed)
This problem is new. She had started to see neurology back in February of this year for self identified memory issues. Her workup by neurology was interrupted by her subdural hematoma. Her Mini-Mental status exam was 26 in February by neurology. Her mini cog today was 0. She did have delirium she was admitted for a UTI in June 2018. She likely does have dementia although is this is the first time I am seeing her for this issue, I'm hesitant to make the diagnosis. Her daughter stated that she is going to continue to see neurology to further work this up.  PLAN : Continue to follow

## 2017-06-07 NOTE — Progress Notes (Signed)
   Subjective:    Patient ID: [name], female    DOB: 31-Mar-1941, 76 y.o.   MRN: [id number]  HPI  [name] is here for edema. Please see the A&P for the status of the pt's chronic medical problems.  ROS : per ROS section and in problem oriented charting. All other systems are negative.  PMHx, Soc hx, and / or Fam hx : Her daughter is now living with her. She has no stairs in her home. She is using a rolling walker for ambulation. She has completed home physical therapy and occupational therapy. She has completed her with PMR. She is no longer seeing neurosurgery although she continues to see urology and orthopedics.    Review of Systems  Respiratory: Positive for cough.   Cardiovascular: Positive for leg swelling.  Gastrointestinal: Positive for abdominal distention, abdominal pain and constipation.       Daughter reports pt with regular BM  Musculoskeletal: Positive for arthralgias, back pain and gait problem.  Skin: Negative for wound.  Psychiatric/Behavioral: Positive for confusion.       Objective:   Physical Exam  Constitutional: She appears well-developed and well-nourished. No distress.  HENT:  Head: Normocephalic and atraumatic.  Right Ear: External ear normal.  Left Ear: External ear normal.  Nose: Nose normal.  Eyes: Conjunctivae and EOM are normal. Right eye exhibits no discharge. Left eye exhibits no discharge. No scleral icterus.  Musculoskeletal: She exhibits edema and tenderness.  Neurological: She is alert.  Skin: Skin is warm and dry. No rash noted. She is not diaphoretic. No erythema. No pallor.  Psychiatric: Her affect is labile. Her speech is rapid and/or pressured and tangential. She is agitated. Cognition and memory are impaired. She expresses impulsivity and inappropriate judgment.  Focused on pain. Nonsense statements (no BM since July). Aggressive statements towards daughter. States she is not being treated as a human being.          Mini-Cog - 06/07/17 1404    Normal clock drawing test? no   How many words correct? 0         Assessment & Plan:

## 2017-06-07 NOTE — Assessment & Plan Note (Signed)
This problem is chronic and worsened. She had lost about 30 pounds from this time last year until April. Since April, she has gained 30 pounds. She has now back to her weight this time last year. I do not get the sense that this is intravascular volume overloaded she has no symptoms of pulmonary edema. She does have lower extremity edema that is extravascular volume. We will need to continue to follow her weight.  PLAN : follow weight

## 2017-06-07 NOTE — Assessment & Plan Note (Signed)
This problem is chronic and stable. I reviewed her most recent psychiatry note which indicated she was to continue her Wellbutrin and hydroxyzine.  PLAN : follow psychiatry's notes

## 2017-06-07 NOTE — Patient Instructions (Signed)
1. See me in 3 months 2. Take Lasix 20 twice a day if helps with feet swelling 3. OK to take ditropan for urine 4. OK to take up to 6 tylenol 500 mg a day  5. I will order a bone density

## 2017-06-08 ENCOUNTER — Encounter: Payer: Self-pay | Admitting: Internal Medicine

## 2017-06-08 ENCOUNTER — Other Ambulatory Visit: Payer: Self-pay | Admitting: Internal Medicine

## 2017-06-08 DIAGNOSIS — N189 Chronic kidney disease, unspecified: Secondary | ICD-10-CM

## 2017-06-08 LAB — CBC
Hematocrit: 38.1 % (ref 34.0–46.6)
Hemoglobin: 12.2 g/dL (ref 11.1–15.9)
MCH: 30.4 pg (ref 26.6–33.0)
MCHC: 32 g/dL (ref 31.5–35.7)
MCV: 95 fL (ref 79–97)
Platelets: 230 10*3/uL (ref 150–379)
RBC: 4.01 x10E6/uL (ref 3.77–5.28)
RDW: 17.4 % — ABNORMAL HIGH (ref 12.3–15.4)
WBC: 8.1 10*3/uL (ref 3.4–10.8)

## 2017-06-08 LAB — BMP8+ANION GAP
Anion Gap: 18 mmol/L (ref 10.0–18.0)
BUN/Creatinine Ratio: 15 (ref 12–28)
BUN: 26 mg/dL (ref 8–27)
CO2: 21 mmol/L (ref 20–29)
Calcium: 9.7 mg/dL (ref 8.7–10.3)
Chloride: 104 mmol/L (ref 96–106)
Creatinine, Ser: 1.74 mg/dL — ABNORMAL HIGH (ref 0.57–1.00)
GFR calc Af Amer: 33 mL/min/{1.73_m2} — ABNORMAL LOW (ref >59)
GFR calc non Af Amer: 28 mL/min/{1.73_m2} — ABNORMAL LOW (ref >59)
Glucose: 85 mg/dL (ref 65–99)
Potassium: 4.2 mmol/L (ref 3.5–5.2)
Sodium: 143 mmol/L (ref 134–144)

## 2017-06-08 LAB — FERRITIN: Ferritin: 33 ng/mL (ref 15–150)

## 2017-06-08 NOTE — Progress Notes (Signed)
Called, mon at 1530, she repeated back to hold lasix

## 2017-06-11 ENCOUNTER — Other Ambulatory Visit (INDEPENDENT_AMBULATORY_CARE_PROVIDER_SITE_OTHER): Payer: PPO

## 2017-06-11 ENCOUNTER — Encounter: Payer: PPO | Attending: Physical Medicine & Rehabilitation | Admitting: Physical Medicine & Rehabilitation

## 2017-06-11 DIAGNOSIS — Z833 Family history of diabetes mellitus: Secondary | ICD-10-CM | POA: Insufficient documentation

## 2017-06-11 DIAGNOSIS — I129 Hypertensive chronic kidney disease with stage 1 through stage 4 chronic kidney disease, or unspecified chronic kidney disease: Secondary | ICD-10-CM | POA: Insufficient documentation

## 2017-06-11 DIAGNOSIS — Z96643 Presence of artificial hip joint, bilateral: Secondary | ICD-10-CM | POA: Insufficient documentation

## 2017-06-11 DIAGNOSIS — R4182 Altered mental status, unspecified: Secondary | ICD-10-CM | POA: Insufficient documentation

## 2017-06-11 DIAGNOSIS — Z8249 Family history of ischemic heart disease and other diseases of the circulatory system: Secondary | ICD-10-CM | POA: Insufficient documentation

## 2017-06-11 DIAGNOSIS — I341 Nonrheumatic mitral (valve) prolapse: Secondary | ICD-10-CM | POA: Insufficient documentation

## 2017-06-11 DIAGNOSIS — Z8673 Personal history of transient ischemic attack (TIA), and cerebral infarction without residual deficits: Secondary | ICD-10-CM | POA: Insufficient documentation

## 2017-06-11 DIAGNOSIS — M109 Gout, unspecified: Secondary | ICD-10-CM | POA: Insufficient documentation

## 2017-06-11 DIAGNOSIS — Z8541 Personal history of malignant neoplasm of cervix uteri: Secondary | ICD-10-CM | POA: Insufficient documentation

## 2017-06-11 DIAGNOSIS — N183 Chronic kidney disease, stage 3 (moderate): Secondary | ICD-10-CM | POA: Insufficient documentation

## 2017-06-11 DIAGNOSIS — Z90710 Acquired absence of both cervix and uterus: Secondary | ICD-10-CM | POA: Insufficient documentation

## 2017-06-11 DIAGNOSIS — R509 Fever, unspecified: Secondary | ICD-10-CM | POA: Insufficient documentation

## 2017-06-11 DIAGNOSIS — Z9889 Other specified postprocedural states: Secondary | ICD-10-CM | POA: Insufficient documentation

## 2017-06-11 DIAGNOSIS — Z87891 Personal history of nicotine dependence: Secondary | ICD-10-CM | POA: Insufficient documentation

## 2017-06-11 DIAGNOSIS — F329 Major depressive disorder, single episode, unspecified: Secondary | ICD-10-CM | POA: Insufficient documentation

## 2017-06-11 DIAGNOSIS — N189 Chronic kidney disease, unspecified: Secondary | ICD-10-CM | POA: Diagnosis not present

## 2017-06-11 DIAGNOSIS — K219 Gastro-esophageal reflux disease without esophagitis: Secondary | ICD-10-CM | POA: Insufficient documentation

## 2017-06-11 DIAGNOSIS — Z818 Family history of other mental and behavioral disorders: Secondary | ICD-10-CM | POA: Insufficient documentation

## 2017-06-11 DIAGNOSIS — E785 Hyperlipidemia, unspecified: Secondary | ICD-10-CM | POA: Insufficient documentation

## 2017-06-11 DIAGNOSIS — Z8543 Personal history of malignant neoplasm of ovary: Secondary | ICD-10-CM | POA: Insufficient documentation

## 2017-06-11 DIAGNOSIS — I872 Venous insufficiency (chronic) (peripheral): Secondary | ICD-10-CM | POA: Insufficient documentation

## 2017-06-11 DIAGNOSIS — M5137 Other intervertebral disc degeneration, lumbosacral region: Secondary | ICD-10-CM | POA: Insufficient documentation

## 2017-06-11 DIAGNOSIS — Z823 Family history of stroke: Secondary | ICD-10-CM | POA: Insufficient documentation

## 2017-06-11 DIAGNOSIS — R42 Dizziness and giddiness: Secondary | ICD-10-CM | POA: Insufficient documentation

## 2017-06-11 LAB — BASIC METABOLIC PANEL
Anion gap: 9 (ref 5–15)
BUN: 14 mg/dL (ref 6–20)
CO2: 24 mmol/L (ref 22–32)
Calcium: 9.4 mg/dL (ref 8.9–10.3)
Chloride: 109 mmol/L (ref 101–111)
Creatinine, Ser: 1.4 mg/dL — ABNORMAL HIGH (ref 0.44–1.00)
GFR calc Af Amer: 41 mL/min — ABNORMAL LOW (ref >60)
GFR calc non Af Amer: 36 mL/min — ABNORMAL LOW (ref >60)
Glucose, Bld: 90 mg/dL (ref 65–99)
Potassium: 4 mmol/L (ref 3.5–5.1)
Sodium: 142 mmol/L (ref 135–145)

## 2017-06-11 NOTE — Addendum Note (Signed)
Addended by: Larey Dresser A on: 06/11/2017 03:27 PM   Modules accepted: Orders

## 2017-06-11 NOTE — Addendum Note (Signed)
Addended by: Orson Gear on: 06/11/2017 03:34 PM   Modules accepted: Orders

## 2017-06-12 ENCOUNTER — Encounter: Payer: Self-pay | Admitting: Physical Medicine & Rehabilitation

## 2017-06-13 ENCOUNTER — Other Ambulatory Visit: Payer: PPO

## 2017-06-13 LAB — POCT URINALYSIS DIPSTICK
Bilirubin, UA: NEGATIVE
Blood, UA: NEGATIVE
Glucose, UA: NEGATIVE
Ketones, UA: NEGATIVE
Leukocytes, UA: NEGATIVE
Nitrite, UA: NEGATIVE
Protein, UA: NEGATIVE
Spec Grav, UA: 1.015 (ref 1.010–1.025)
Urobilinogen, UA: 0.2 E.U./dL
pH, UA: 5.5 (ref 5.0–8.0)

## 2017-06-14 ENCOUNTER — Encounter: Payer: Self-pay | Admitting: Internal Medicine

## 2017-06-20 ENCOUNTER — Other Ambulatory Visit: Payer: Self-pay | Admitting: *Deleted

## 2017-06-20 ENCOUNTER — Ambulatory Visit (INDEPENDENT_AMBULATORY_CARE_PROVIDER_SITE_OTHER): Payer: PPO | Admitting: Adult Health

## 2017-06-20 ENCOUNTER — Telehealth: Payer: Self-pay | Admitting: Adult Health

## 2017-06-20 ENCOUNTER — Encounter: Payer: Self-pay | Admitting: Adult Health

## 2017-06-20 ENCOUNTER — Other Ambulatory Visit: Payer: Self-pay | Admitting: Internal Medicine

## 2017-06-20 VITALS — BP 124/64 | HR 80 | Ht 62.0 in | Wt 218.0 lb

## 2017-06-20 DIAGNOSIS — M545 Low back pain, unspecified: Secondary | ICD-10-CM

## 2017-06-20 DIAGNOSIS — Z8679 Personal history of other diseases of the circulatory system: Secondary | ICD-10-CM | POA: Diagnosis not present

## 2017-06-20 DIAGNOSIS — R413 Other amnesia: Secondary | ICD-10-CM | POA: Diagnosis not present

## 2017-06-20 MED ORDER — DONEPEZIL HCL 5 MG PO TABS: 5.0000 mg | ORAL_TABLET | Freq: Every day | ORAL | 5 refills | Status: DC

## 2017-06-20 NOTE — Telephone Encounter (Signed)
[name] (listed on DPR) called to confirm the pharmacy she would like for patient to use is [facility] when Aricept 5mg  is called in. FYI

## 2017-06-20 NOTE — Progress Notes (Signed)
PATIENT: [name] DOB: Oct 13, 1941  REASON FOR VISIT: follow up HISTORY FROM: patient  HISTORY OF PRESENT ILLNESS: Today 06/20/17 [name] is a 76 year old female with a history of bilateral subdural hematomas and memory disturbance. She returns today for follow-up. In the past the patient has had surgery for subdural hematomas. The patient had repeat CT scan in April that showed possible worsening and the patient was sent to the emergency room. After being evaluated by Dr. [name] the patient did not undergo surgery. She was discharged to a skilled nursing facility. She has now returned back to her home and has been released by neurosurgeon. Her daughter lives with her. The daughter has noticed some worsening memory problems. She is able to complete all ADLs independently. She does not operate a motor vehicle. Her daughter handles all of her finances. Her daughter also does all the cooking. 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 The patient is more concerned today about swelling in the lower extremities. Daughter reports that Lasix was discontinued due to kidney function abnormality. She reports since being off of Lasix kidney function has improved. However the patient is having a lot of discomfort from ongoing swelling. She returns today for follow-up.  HISTORY 01/26/17: [name] is a 76 year old right-handed black female with a history of a mild memory disorder. The patient fell on 01/13/2017 when she was trying to feed a dog. She tripped over the dog, fell backwards and struck the back of her head, and hurt her low back as well. X-ray of the low back shows degenerative arthritis, but no fractures. The initial CT of the brain was unremarkable. The patient was set up for MRI of the brain for evaluation of memory problems before she had the fall, the MRI was done yesterday, and showed evidence of bilateral subdural  hematoma with chronic and subacute features. The patient is on low-dose aspirin. The patient did note headache for several days after the original fall, but headache has disappeared at this time. She continues to note significant back pain around the coccyx area, she has pain when trying to have a bowel movement. Sitting induces significant pain. She denies pain down either leg. She is taking Ultram for this discomfort, but this has not been extremely helpful.   REVIEW OF SYSTEMS: Out of a complete 14 system review of symptoms, the patient complains only of the following symptoms, and all other reviewed systems are negative.  Shortness of breath, cough, hearing loss, excessive swelling, activity change, incontinence of bladder, back pain, joint pain, frequent waking, confusion, decreased concentration, depression, nervous/anxious, hallucinations  ALLERGIES: Allergies  Allergen Reactions  . Aspirin Other (See Comments)    Bleeding from vagina   . Sulfamethoxazole-Trimethoprim Itching    HOME MEDICATIONS: Outpatient Medications Prior to Visit  Medication Sig Dispense Refill  . acetaminophen (TYLENOL) 500 MG tablet Take 500 mg by mouth every 8 (eight) hours as needed.    [name] allopurinol (ZYLOPRIM) 300 MG tablet TAKE 1 TABLET BY MOUTH DAILY 90 tablet 3  . amLODipine (NORVASC) 10 MG tablet TAKE 1 TABLET BY MOUTH EVERY DAY 90 tablet 3  . ascorbic acid (VITAMIN C) 500 MG tablet Take 500 mg by mouth daily.    [name] atorvastatin (LIPITOR) 40 MG tablet Take 1 tablet (40 mg total) by mouth daily. 90 tablet 3  . buPROPion (WELLBUTRIN XL) 300 MG 24 hr tablet Take 1 tablet (  300 mg total) by mouth every morning. 30 tablet 2  . fluocinonide cream (LIDEX) 0.05 % APPLY TO AFFECTED AREA TWICE DAILY 60 g 1  . fluticasone (FLONASE) 50 MCG/ACT nasal spray INSTILL 2 SPRAYS IN EACH NOSTRIL DAILY 16 g 4  . hydrOXYzine (VISTARIL) 25 MG capsule Take 1 capsule (25 mg total) by mouth at bedtime. 30 capsule 2  . losartan  (COZAAR) 100 MG tablet Take 1 tablet (100 mg total) by mouth daily. 30 tablet 11  . Multiple Vitamins-Minerals (ALIVE WOMENS ENERGY) TABS Take 1 tablet by mouth daily.     [name] oxybutynin (DITROPAN-XL) 5 MG 24 hr tablet Take 5 mg by mouth at bedtime.    . pantoprazole (PROTONIX) 40 MG tablet TAKE 1 TABLET BY MOUTH TWICE DAILY BEFORE A MEAL 180 tablet 3  . potassium chloride (MICRO-K) 10 MEQ CR capsule TAKE 1 CAPSULE BY MOUTH DAILY 90 capsule 2  . traMADol (ULTRAM) 50 MG tablet Take 1 tablet (50 mg total) by mouth 2 (two) times daily as needed. 60 tablet 2  . furosemide (LASIX) 20 MG tablet Take 1 tablet (20 mg total) by mouth 2 (two) times daily as needed. (Patient not taking: Reported on 06/20/2017) 60 tablet 2   No facility-administered medications prior to visit.     PAST MEDICAL HISTORY: Past Medical History:  Diagnosis Date  . Chronic venous insufficiency   . Colon, diverticulosis Sept. 2011   outpatient colonoscopy by Dr. Cristina Gong.  Need record  . DDD (degenerative disc disease), lumbosacral   . Depression   . DJD (degenerative joint disease), multiple sites    Low back pain worst  . GERD (gastroesophageal reflux disease)   . Gout   . Gout    Uric Acid level 4.2 on 300 allopurinol  . History of cervical cancer 1972  . Hx-TIA (transient ischemic attack) 05/13/2001   Right facial numbness  . Hyperlipidemia   . Hypertension   . Memory disorder 12/20/2016  . Mitral valve prolapse 10/27/1999   Subsequent 2D echo show normal mitral valve  . Osteoarthritis of hip    bilateral hips  . Ovarian cancer ([place]) 1972   S/P oophorectomy  . Psychosis   . Recurrent boils    History of MRSA skin infections with abscess  . Sensorineural hearing loss of both ears   . Subdural hematoma ([place]) 01/26/2017   bilateral    PAST SURGICAL HISTORY: Past Surgical History:  Procedure Laterality Date  . ABDOMINAL HYSTERECTOMY     1972  . BURR HOLE Bilateral 02/12/2017   Procedure: Haskell Flirt;  Surgeon:  Earnie Larsson, MD;  Location: [facility];  Service: Neurosurgery;  Laterality: Bilateral;  . CERVICAL DISCECTOMY  7/07   C5-C6  . JOINT REPLACEMENT     bilateral hip replacement  . LUMBAR DISC SURGERY     L5-S1 7/07  . LUMBAR LAMINECTOMY/DECOMPRESSION MICRODISCECTOMY N/A 02/02/2016   Procedure: L4-5 Decompression;  Surgeon: Marybelle Killings, MD;  Location: [facility];  Service: Orthopedics;  Laterality: N/A;  . OOPHORECTOMY     1972 for ovarian cancer    FAMILY HISTORY: Family History  Problem Relation Age of Onset  . Diabetes Mother   . Heart disease Mother   . Hearing loss Mother   . Stroke Mother   . Post-traumatic stress disorder Father   . Schizophrenia Maternal Grandmother   . Diabetes Maternal Grandfather   . Other Brother        brother died in mental health hospital  .  Kidney cancer Neg Hx   . Bladder Cancer Neg Hx     SOCIAL HISTORY: Social History   Social History  . Marital status: Widowed    Spouse name: N/A  . Number of children: 3  . Years of education: 15   Occupational History  . Retired    Social History Main Topics  . Smoking status: Former Smoker    Packs/day: 0.10    Types: Cigarettes    Quit date: 04/08/2013  . Smokeless tobacco: Never Used  . Alcohol use No  . Drug use: No  . Sexual activity: No   Other Topics Concern  . Not on file   Social History Narrative   Lives with daughter in [place]. Daughter is primary caretaker, surrogate decision-maker, and arranges for [facility] aide and other caregivers to supervise Ms. Laperle at home. Ambulatory      PHYSICAL EXAM  Vitals:   06/20/17 1337  BP: 124/64  Pulse: 80  Weight: 218 lb (98.9 kg)  Height: 5\' 2"  (1.575 m)   Body mass index is 39.87 kg/m.   MMSE - Mini Mental State Exam 06/20/2017 01/26/2017 12/20/2016  Orientation to time 3 5 4   Orientation to Place 4 5 5   Registration 2 3 3   Attention/ Calculation 0 5 5  Recall 1 3 1   Language- name 2 objects 2 2 2   Language- repeat 1 1 1   Language-  follow 3 step command 3 3 3   Language- read & follow direction 1 1 1   Write a sentence 1 1 1   Copy design 1 0 0  Total score 19 29 26      Generalized: Well developed, in no acute distress   Neurological examination  Mentation: Alert. Follows all commands speech and language fluent Cranial nerve II-XII: Pupils were equal round reactive to light. Extraocular movements were full, visual field were full on confrontational test. Facial sensation and strength were normal. Uvula tongue midline. Head turning and shoulder shrug  were normal and symmetric. Motor: The motor testing reveals 5 over 5 strength of all 4 extremities. Good symmetric motor tone is noted throughout.  Sensory: Sensory testing is intact to soft touch on all 4 extremities. No evidence of extinction is noted.  Coordination: Cerebellar testing reveals good finger-nose-finger and heel-to-shin bilaterally.  Gait and station: Patient uses a Rollator when ambulating.    DIAGNOSTIC DATA (LABS, IMAGING, TESTING) - I reviewed patient records, labs, notes, testing and imaging myself where available.  Lab Results  Component Value Date   WBC 8.1 06/07/2017   HGB 12.2 06/07/2017   HCT 38.1 06/07/2017   MCV 95 06/07/2017   PLT 230 06/07/2017      Component Value Date/Time   NA 142 06/11/2017 1544   NA 143 06/07/2017 1014   K 4.0 06/11/2017 1544   CL 109 06/11/2017 1544   CO2 24 06/11/2017 1544   GLUCOSE 90 06/11/2017 1544   BUN 14 06/11/2017 1544   BUN 26 06/07/2017 1014   CREATININE 1.40 (H) 06/11/2017 1544   CREATININE 1.34 (H) 05/19/2014 1136   CALCIUM 9.4 06/11/2017 1544   PROT 7.2 04/06/2017 1740   ALBUMIN 3.4 (L) 04/06/2017 1740   AST 31 04/06/2017 1740   ALT 38 04/06/2017 1740   ALKPHOS 104 04/06/2017 1740   BILITOT 0.6 04/06/2017 1740   GFRNONAA 36 (L) 06/11/2017 1544   GFRNONAA 40 (L) 05/19/2014 1136   GFRAA 41 (L) 06/11/2017 1544   GFRAA 46 (L) 05/19/2014 1136   Lab  Results  Component Value Date   CHOL  163 08/13/2014   HDL 52 08/13/2014   LDLCALC 92 08/13/2014   TRIG 96 08/13/2014   CHOLHDL 3.1 08/13/2014   Lab Results  Component Value Date   HGBA1C 5.7 08/03/2016   Lab Results  Component Value Date   TAVWPVXY80 165 12/20/2016   Lab Results  Component Value Date   TSH 3.897 08/13/2014      ASSESSMENT AND PLAN 76 y.o. year old female  has a past medical history of Chronic venous insufficiency; Colon, diverticulosis (Sept. 2011); DDD (degenerative disc disease), lumbosacral; Depression; DJD (degenerative joint disease), multiple sites; GERD (gastroesophageal reflux disease); Gout; Gout; History of cervical cancer (5374); TIA (transient ischemic attack) (05/13/2001); Hyperlipidemia; Hypertension; Memory disorder (12/20/2016); Mitral valve prolapse (10/27/1999); Osteoarthritis of hip; Ovarian cancer ([place]) (1972); Psychosis; Recurrent boils; Sensorineural hearing loss of both ears; and Subdural hematoma ([place]) (01/26/2017). here with :  1. Memory disturbance 2. Hx of SDH  The patient's score has declined since her visit in March. I discussed with the patient and her daughter that we are unsure if this is a progressive memory disturbance or perhaps related to her history of subdural hematomas. The daughter and the patient has elected to try medication. We will start the patient on Aricept 5 mg at bedtime. I have reviewed side effects with the patient and her daughter. I did provide him with a handout. They are advised that if her symptoms worsen or she develops new symptoms he should let us know. She will follow-up in 4-5 months or sooner if needed.   Ward Givens, MSN, NP-C 06/20/2017, 1:44 PM [facility] [address], [place], [place] [address] [phone]

## 2017-06-20 NOTE — Progress Notes (Signed)
I have read the note, and I agree with the clinical assessment and plan.  WILLIS,CHARLES KEITH   

## 2017-06-20 NOTE — Telephone Encounter (Signed)
Order done to [facility].

## 2017-06-20 NOTE — Patient Instructions (Addendum)
Your Plan:  Start Aricept 5 mg at bedtime If your symptoms worsen or you develop new symptoms please let us know.  Thank you for coming to see [place] at [facility]. I hope we have been able to provide you high quality care today.  You may receive a patient satisfaction survey over the next few weeks. We would appreciate your feedback and comments so that we may continue to improve ourselves and the health of our patients.  Donepezil tablets What is this medicine? DONEPEZIL (doe NEP e zil) is used to treat mild to moderate dementia caused by Alzheimer's disease. This medicine may be used for other purposes; ask your health care provider or pharmacist if you have questions. COMMON BRAND NAME(S): Aricept What should I tell my health care provider before I take this medicine? They need to know if you have any of these conditions: -asthma or other lung disease -difficulty passing urine -head injury -heart disease -history of irregular heartbeat -liver disease -seizures (convulsions) -stomach or intestinal disease, ulcers or stomach bleeding -an unusual or allergic reaction to donepezil, other medicines, foods, dyes, or preservatives -pregnant or trying to get pregnant -breast-feeding How should I use this medicine? Take this medicine by mouth with a glass of water. Follow the directions on the prescription label. You may take this medicine with or without food. Take this medicine at regular intervals. This medicine is usually taken before bedtime. Do not take it more often than directed. Continue to take your medicine even if you feel better. Do not stop taking except on your doctor's advice. If you are taking the 23 mg donepezil tablet, swallow it whole; do not cut, crush, or chew it. Talk to your pediatrician regarding the use of this medicine in children. Special care may be needed. Overdosage: If you think you have taken too much of this medicine contact a poison control  center or emergency room at once. NOTE: This medicine is only for you. Do not share this medicine with others. What if I miss a dose? If you miss a dose, take it as soon as you can. If it is almost time for your next dose, take only that dose, do not take double or extra doses. What may interact with this medicine? Do not take this medicine with any of the following medications: -certain medicines for fungal infections like itraconazole, fluconazole, posaconazole, and voriconazole -cisapride -dextromethorphan; quinidine -dofetilide -dronedarone -pimozide -quinidine -thioridazine -ziprasidone This medicine may also interact with the following medications: -antihistamines for allergy, cough and cold -atropine -bethanechol -carbamazepine -certain medicines for bladder problems like oxybutynin, tolterodine -certain medicines for Parkinson's disease like benztropine, trihexyphenidyl -certain medicines for stomach problems like dicyclomine, hyoscyamine -certain medicines for travel sickness like scopolamine -dexamethasone -ipratropium -NSAIDs, medicines for pain and inflammation, like ibuprofen or naproxen -other medicines for Alzheimer's disease -other medicines that prolong the QT interval (cause an abnormal heart rhythm) -phenobarbital -phenytoin -rifampin, rifabutin or rifapentine This list may not describe all possible interactions. Give your health care provider a list of all the medicines, herbs, non-prescription drugs, or dietary supplements you use. Also tell them if you smoke, drink alcohol, or use illegal drugs. Some items may interact with your medicine. What should I watch for while using this medicine? Visit your doctor or health care professional for regular checks on your progress. Check with your doctor or health care professional if your symptoms do not get better or if they get worse. You may get drowsy or dizzy. Do not  drive, use machinery, or do anything that needs  mental alertness until you know how this drug affects you. What side effects may I notice from receiving this medicine? Side effects that you should report to your doctor or health care professional as soon as possible: -allergic reactions like skin rash, itching or hives, swelling of the face, lips, or tongue -feeling faint or lightheaded, falls -loss of bladder control -seizures -signs and symptoms of a dangerous change in heartbeat or heart rhythm like chest pain; dizziness; fast or irregular heartbeat; palpitations; feeling faint or lightheaded, falls; breathing problems -signs and symptoms of infection like fever or chills; cough; sore throat; pain or trouble passing urine -signs and symptoms of liver injury like dark yellow or brown urine; general ill feeling or flu-like symptoms; light-colored stools; loss of appetite; nausea; right upper belly pain; unusually weak or tired; yellowing of the eyes or skin -slow heartbeat or palpitations -unusual bleeding or bruising -vomiting Side effects that usually do not require medical attention (report to your doctor or health care professional if they continue or are bothersome): -diarrhea, especially when starting treatment -headache -loss of appetite -muscle cramps -nausea -stomach upset This list may not describe all possible side effects. Call your doctor for medical advice about side effects. You may report side effects to FDA at 1-800-FDA-1088. Where should I keep my medicine? Keep out of reach of children. Store at room temperature between 15 and 30 degrees C (59 and 86 degrees F). Throw away any unused medicine after the expiration date. NOTE: This sheet is a summary. It may not cover all possible information. If you have questions about this medicine, talk to your doctor, pharmacist, or health care provider.  2018 Elsevier/Gold Standard (2016-04-03 21:00:42)

## 2017-06-22 ENCOUNTER — Encounter: Payer: Self-pay | Admitting: Internal Medicine

## 2017-06-29 ENCOUNTER — Ambulatory Visit: Payer: Self-pay | Admitting: *Deleted

## 2017-07-03 ENCOUNTER — Encounter: Payer: Self-pay | Admitting: Internal Medicine

## 2017-07-03 ENCOUNTER — Encounter (HOSPITAL_COMMUNITY): Payer: Self-pay | Admitting: Emergency Medicine

## 2017-07-03 ENCOUNTER — Ambulatory Visit (HOSPITAL_COMMUNITY)
Admission: EM | Admit: 2017-07-03 | Discharge: 2017-07-03 | Disposition: A | Discharge status: Home / Self Care | Payer: PPO | Attending: Family Medicine | Admitting: Family Medicine

## 2017-07-03 DIAGNOSIS — N3090 Cystitis, unspecified without hematuria: Secondary | ICD-10-CM

## 2017-07-03 LAB — POCT URINALYSIS DIP (DEVICE)
Bilirubin Urine: NEGATIVE
Glucose, UA: NEGATIVE mg/dL
Hgb urine dipstick: NEGATIVE
Ketones, ur: NEGATIVE mg/dL
Leukocytes, UA: NEGATIVE
Nitrite: POSITIVE — AB
Protein, ur: NEGATIVE mg/dL
Specific Gravity, Urine: 1.015 (ref 1.005–1.030)
Urobilinogen, UA: 0.2 mg/dL (ref 0.0–1.0)
pH: 6 (ref 5.0–8.0)

## 2017-07-03 MED ORDER — CIPROFLOXACIN HCL 500 MG PO TABS: 500.0000 mg | ORAL_TABLET | Freq: Two times a day (BID) | ORAL | 0 refills | Status: AC

## 2017-07-03 NOTE — ED Triage Notes (Signed)
uti in May and one in June.  Patient has lower abdominal pain/groin pain.  Pain has never really gone away.  Patient has seen a urologist with testing.  Patient has mentioned this pain to pcp.

## 2017-07-03 NOTE — ED Provider Notes (Signed)
[place]    CSN: [id number] Arrival date & time: 07/03/17  1343     History   Chief Complaint Chief Complaint  Patient presents with  . Recurrent UTI    HPI [name] is a 76 y.o. female.   76 year old female with history of chronic venous insufficiency, hypertension, hyperlipidemia, ovarian cancer s/p hysterectomy, bilateral hip replacement comes in for few day history of dysuria and 2 month history of low abdominal pain/groin pain. Patient is here with daughter, who is providing the history. Daughter states that patient has been treated for recurrent UTIs occurred in may and June. She was seen by an urologist for this issue was discharged back to PCP due to negative workup. Daughter states around that time, patient started having constant groin/lower abdominal pain that is worse with movement. Patient was treated by orthopedics without improvement. Patient has alternating diarrhea and constipation, and daughter is giving otc medications for constipation when it occurs. Denies nausea, vomiting, association with food. Denies fever, chills, night sweats. Patient also has bilateral lower extremity swelling, which is chronic.       Past Medical History:  Diagnosis Date  . Chronic venous insufficiency   . Colon, diverticulosis Sept. 2011   outpatient colonoscopy by Dr. Cristina Gong.  Need record  . DDD (degenerative disc disease), lumbosacral   . Depression   . DJD (degenerative joint disease), multiple sites    Low back pain worst  . GERD (gastroesophageal reflux disease)   . Gout   . Gout    Uric Acid level 4.2 on 300 allopurinol  . History of cervical cancer 1972  . Hx-TIA (transient ischemic attack) 05/13/2001   Right facial numbness  . Hyperlipidemia   . Hypertension   . Memory disorder 12/20/2016  . Mitral valve prolapse 10/27/1999   Subsequent 2D echo show normal mitral valve  . Osteoarthritis of hip    bilateral hips  . Ovarian cancer ([place]) 1972   S/P  oophorectomy  . Psychosis   . Recurrent boils    History of MRSA skin infections with abscess  . Sensorineural hearing loss of both ears   . Subdural hematoma ([place]) 01/26/2017   bilateral    Patient Active Problem List   Diagnosis Date Noted  . History of bilateral hip replacements   . Memory disorder 12/20/2016  . History of lumbar laminectomy for spinal cord decompression 02/02/2016  . Atopic dermatitis 07/09/2015  . Reactive airway disease 08/11/2014  . History of cancer 08/11/2014  . OSA (obstructive sleep apnea) 08/11/2014  . Healthcare maintenance 07/17/2013  . Chronic kidney disease (CKD), stage III (moderate) 10/04/2012  . Schizophrenia, chronic condition ([place]) 01/20/2012  . Postmenopausal atrophic vaginitis 11/01/2010  . Chronic diastolic heart failure ([place]) 05/06/2010  . Allergic rhinitis 10/14/2009  . Obesity, Class II, BMI 35-39.9, with comorbidity 05/14/2009  . Chronic prescription opiate use 04/29/2008  . Normocytic anemia 09/25/2007  . Hyperlipidemia 09/03/2006  . Gout 09/03/2006  . Essential hypertension 09/03/2006  . GERD 09/03/2006  . Hx-TIA (transient ischemic attack) 05/13/2001    Past Surgical History:  Procedure Laterality Date  . ABDOMINAL HYSTERECTOMY     1972  . BURR HOLE Bilateral 02/12/2017   Procedure: Haskell Flirt;  Surgeon: Earnie Larsson, MD;  Location: [facility];  Service: Neurosurgery;  Laterality: Bilateral;  . CERVICAL DISCECTOMY  7/07   C5-C6  . JOINT REPLACEMENT     bilateral hip replacement  . LUMBAR DISC SURGERY     L5-S1  7/07  . LUMBAR LAMINECTOMY/DECOMPRESSION MICRODISCECTOMY N/A 02/02/2016   Procedure: L4-5 Decompression;  Surgeon: Marybelle Killings, MD;  Location: [facility];  Service: Orthopedics;  Laterality: N/A;  . [place] for ovarian cancer    OB History    No data available       Home Medications    Prior to Admission medications   Medication Sig Start Date End Date Taking? Authorizing Provider  acetaminophen  (TYLENOL) 500 MG tablet Take 500 mg by mouth every 8 (eight) hours as needed.    [provider]  allopurinol (ZYLOPRIM) 300 MG tablet TAKE 1 TABLET BY MOUTH DAILY 04/18/17   Bartholomew Crews, MD  amLODipine (NORVASC) 10 MG tablet TAKE 1 TABLET BY MOUTH EVERY DAY 04/18/17   Bartholomew Crews, MD  ascorbic acid (VITAMIN C) 500 MG tablet Take 500 mg by mouth daily.    [provider]  atorvastatin (LIPITOR) 40 MG tablet Take 1 tablet (40 mg total) by mouth daily. 06/07/17   Bartholomew Crews, MD  buPROPion (WELLBUTRIN XL) 300 MG 24 hr tablet Take 1 tablet (300 mg total) by mouth every morning. 05/31/17   Arfeen, Arlyce Harman, MD  ciprofloxacin (CIPRO) 500 MG tablet Take 1 tablet (500 mg total) by mouth every 12 (twelve) hours. 07/03/17 07/08/17  Tasia Catchings, Amy V, PA-C  donepezil (ARICEPT) 5 MG tablet Take 1 tablet (5 mg total) by mouth at bedtime. 06/20/17   Ward Givens, NP  fluocinonide cream (LIDEX) 0.05 % APPLY TO AFFECTED AREA TWICE DAILY 11/02/16   Bartholomew Crews, MD  fluticasone [facility]) 50 MCG/ACT nasal spray INSTILL 2 SPRAYS IN EACH NOSTRIL DAILY 02/28/17   Bartholomew Crews, MD  hydrOXYzine (VISTARIL) 25 MG capsule Take 1 capsule (25 mg total) by mouth at bedtime. 05/31/17   Arfeen, Arlyce Harman, MD  losartan (COZAAR) 100 MG tablet Take 1 tablet (100 mg total) by mouth daily. 03/16/17   Angiulli, Lavon Paganini, PA-C  Multiple Vitamins-Minerals (ALIVE WOMENS ENERGY) TABS Take 1 tablet by mouth daily.     [provider]  oxybutynin (DITROPAN-XL) 5 MG 24 hr tablet Take 5 mg by mouth at bedtime.    [provider]  pantoprazole (PROTONIX) 40 MG tablet TAKE 1 TABLET BY MOUTH TWICE DAILY BEFORE A MEAL 04/18/17   Bartholomew Crews, MD  potassium chloride (MICRO-K) 10 MEQ CR capsule TAKE 1 CAPSULE BY MOUTH DAILY 04/18/17   Bartholomew Crews, MD  traMADol (ULTRAM) 50 MG tablet Take 1 tablet (50 mg total) by mouth 2 (two) times daily as needed. 06/07/17   Bartholomew Crews, MD     Family History Family History  Problem Relation Age of Onset  . Diabetes Mother   . Heart disease Mother   . Hearing loss Mother   . Stroke Mother   . Post-traumatic stress disorder Father   . Schizophrenia Maternal Grandmother   . Diabetes Maternal Grandfather   . Other Brother        brother died in mental health hospital  . Kidney cancer Neg Hx   . Bladder Cancer Neg Hx     Social History Social History  Substance Use Topics  . Smoking status: Former Smoker    Packs/day: 0.10    Types: Cigarettes    Quit date: 04/08/2013  . Smokeless tobacco: Never Used  . Alcohol use No     Allergies   Aspirin and Sulfamethoxazole-trimethoprim   Review of Systems Review of Systems  Reason unable to perform ROS: See HPI as above.  Respiratory: Negative for cough, choking, shortness of breath and wheezing.   Cardiovascular: Negative for chest pain and palpitations.     Physical Exam Triage Vital Signs ED Triage Vitals  Enc Vitals Group     BP 07/03/17 1519 (!) 136/55     Pulse Rate 07/03/17 1519 75     Resp 07/03/17 1519 (!) 22     Temp 07/03/17 1519 98.3 F (36.8 C)     Temp Source 07/03/17 1519 Oral     SpO2 07/03/17 1519 100 %     Weight --      Height --      Head Circumference --      Peak Flow --      Pain Score 07/03/17 1516 10     Pain Loc --      Pain Edu? --      Excl. in [place]? --    No data found.   Updated Vital Signs BP (!) 136/55 (BP Location: Left Arm) Comment: large cuff  Pulse 75   Temp 98.3 F (36.8 C) (Oral)   Resp (!) 22   SpO2 100%      Physical Exam  Constitutional: She is oriented to person, place, and time. She appears well-developed and well-nourished. No distress.  HENT:  Head: Normocephalic and atraumatic.  Eyes: Pupils are equal, round, and reactive to light. Conjunctivae are normal.  Cardiovascular: Normal rate, regular rhythm and normal heart sounds.  Exam reveals no gallop and no friction rub.   No murmur  heard. Pulmonary/Chest: Effort normal and breath sounds normal. She has no wheezes. She has no rales.  Abdominal: There is no CVA tenderness.  Attempted to lay patient down for abdominal exam. Patient was unable to lay down due to back pain/groin pain. Exam performed with patient sitting up. No obvious tenderness to palpation, guarding, rebound. Negative CVA tenderness.   Neurological: She is alert and oriented to person, place, and time.  Skin: Skin is warm and dry.  Psychiatric: She has a normal mood and affect. Her behavior is normal. Judgment normal.     UC Treatments / Results  Labs (all labs ordered are listed, but only abnormal results are displayed) Labs Reviewed  POCT URINALYSIS DIP (DEVICE) - Abnormal; Notable for the following:       Result Value   Nitrite POSITIVE (*)    All other components within normal limits  URINE CULTURE    EKG  EKG Interpretation None       Radiology No results found.  Procedures Procedures (including critical care time)  Medications Ordered in UC Medications - No data to display   Initial Impression / Assessment and Plan / UC Course  I have reviewed the triage vital signs and the nursing notes.  Pertinent labs & imaging results that were available during my care of the patient were reviewed by me and considered in my medical decision making (see chart for details).    Urine dipstick positive for UTI. Start antibiotics as directed. Push fluids. Discussed with patient chronic conditions will need further workup, follow up with PCP for further evaluation and referrals needed. Patient without chest pain, shortness of breath, lung exam clear to auscultation without adventitious lung sounds. Return precautions given.  Case discussed with Dr Mannie Stabile, who agrees to plan.    Final Clinical Impressions(s) / UC Diagnoses   Final diagnoses:  Cystitis    New Prescriptions Discharge Medication List  as of 07/03/2017  4:24 PM    START taking  these medications   Details  ciprofloxacin (CIPRO) 500 MG tablet Take 1 tablet (500 mg total) by mouth every 12 (twelve) hours., Starting Tue 07/03/2017, Until Sun 07/08/2017, Normal          Yu, Amy V, PA-C 07/03/17 [phone]

## 2017-07-03 NOTE — Discharge Instructions (Signed)
Start ciprofloxacin as directed. Keep hydrated, your urine should be clear to pale yellow in color. Monitor for any worsening of symptoms, fever, worsening abdominal pain, nausea/vomiting, flank pain, follow up at the emergency department for further evaluation.

## 2017-07-04 ENCOUNTER — Ambulatory Visit: Payer: PPO | Admitting: Obstetrics and Gynecology

## 2017-07-05 ENCOUNTER — Other Ambulatory Visit: Payer: Self-pay | Admitting: Internal Medicine

## 2017-07-05 DIAGNOSIS — N183 Chronic kidney disease, stage 3 unspecified: Secondary | ICD-10-CM

## 2017-07-06 ENCOUNTER — Ambulatory Visit
Admission: RE | Admit: 2017-07-06 | Discharge: 2017-07-06 | Disposition: A | Discharge status: Home / Self Care | Payer: PPO | Source: Ambulatory Visit | Attending: Internal Medicine | Admitting: Internal Medicine

## 2017-07-06 DIAGNOSIS — Z1231 Encounter for screening mammogram for malignant neoplasm of breast: Secondary | ICD-10-CM | POA: Diagnosis not present

## 2017-07-11 ENCOUNTER — Other Ambulatory Visit: Payer: Self-pay

## 2017-07-11 ENCOUNTER — Ambulatory Visit: Payer: PPO | Admitting: Podiatry

## 2017-07-11 ENCOUNTER — Ambulatory Visit: Payer: Self-pay

## 2017-07-11 ENCOUNTER — Telehealth (HOSPITAL_COMMUNITY): Payer: Self-pay | Admitting: Emergency Medicine

## 2017-07-11 NOTE — ED Notes (Signed)
Ask by CMA Aquilla Solian to review Lab urine culture and if it was obtained

## 2017-07-11 NOTE — Telephone Encounter (Signed)
Tried to call pt back but LM on her VM @ [phone]  Need to tell her that the urine culture was ordered by provider but it was not sent to lab on our behalf.   Pt was placed on antibiotics (Cipro x5 days) and wanted to come back to give another urine sample.   Will wait for her to return call.

## 2017-07-12 ENCOUNTER — Ambulatory Visit: Payer: Self-pay | Admitting: Internal Medicine

## 2017-07-12 ENCOUNTER — Telehealth (HOSPITAL_COMMUNITY): Payer: Self-pay | Admitting: Internal Medicine

## 2017-07-12 NOTE — Telephone Encounter (Signed)
Urine culture was not ordered at urgent care visit 9/4.  Patient finished her course of empiric cipro 3d ago.  Spoke with daughter [name], who will bring patient in for urine culture.  No provider visit needed. No UA needed. Please obtain clean catch urine culture.  Jodi Mourning MD

## 2017-07-17 ENCOUNTER — Encounter: Payer: Self-pay | Admitting: Family Medicine

## 2017-07-17 ENCOUNTER — Ambulatory Visit (INDEPENDENT_AMBULATORY_CARE_PROVIDER_SITE_OTHER): Payer: PPO | Admitting: Family Medicine

## 2017-07-17 VITALS — BP 110/70 | HR 81 | Ht 62.0 in | Wt 218.0 lb

## 2017-07-17 DIAGNOSIS — R7989 Other specified abnormal findings of blood chemistry: Secondary | ICD-10-CM | POA: Diagnosis not present

## 2017-07-17 DIAGNOSIS — M7989 Other specified soft tissue disorders: Secondary | ICD-10-CM | POA: Diagnosis not present

## 2017-07-17 DIAGNOSIS — N3090 Cystitis, unspecified without hematuria: Secondary | ICD-10-CM | POA: Diagnosis not present

## 2017-07-17 DIAGNOSIS — Z23 Encounter for immunization: Secondary | ICD-10-CM

## 2017-07-17 NOTE — Progress Notes (Signed)
Subjective:  [name] is a 76 y.o. female who presents today with a chief complaint of LE edema and to establish care. History is provided by the patient and her daughter.   HPI:  LE Edema, acute problem Patient with swelling in her legs for about a year, though has significantly worsened over the past month. She has pain in both her legs where the swelling is located. The pain interefers with her ability to perform day to day functions at home. She has some mild shortness of breath when laying flat. Weight has been stable. Has been keeping legs elevated. No other treatments tried.   Cystitis, New Problem.  Summary of problem per discussion with daughter: Patient has had 3 infections in the last 4 months. Never had an issue before. Latest one started about 3-4 weeks ago. Went to urgent care 2 weeks ago and was started on a 5 day course of cipro, which she completed, though is still having frequent urination with a foul smell. She was supposed to have a urine culture obtained, but it was never done   Summary of her urgent care visit: Patient seen on 9/4 with a few days of dysuria and had positive nitrites on her UA.   Elevated Creatinine  Patient and daughter are requesting repeat check today.   ROS: Per HPI, otherwise a 14 point review of systems was performed and was negative  PMH:  The following were reviewed and entered/updated in epic: Past Medical History:  Diagnosis Date  . Chronic venous insufficiency   . Colon, diverticulosis Sept. 2011   outpatient colonoscopy by Dr. Cristina Gong.  Need record  . DDD (degenerative disc disease), lumbosacral   . Depression   . DJD (degenerative joint disease), multiple sites    Low back pain worst  . GERD (gastroesophageal reflux disease)   . Gout   . Gout    Uric Acid level 4.2 on 300 allopurinol  . History of cervical cancer 1972  . Hx-TIA (transient ischemic attack) 05/13/2001   Right facial numbness  . Hyperlipidemia   .  Hypertension   . Memory disorder 12/20/2016  . Mitral valve prolapse 10/27/1999   Subsequent 2D echo show normal mitral valve  . Osteoarthritis of hip    bilateral hips  . Ovarian cancer ([place]) 1972   S/P oophorectomy  . Psychosis   . Recurrent boils    History of MRSA skin infections with abscess  . Sensorineural hearing loss of both ears   . Subdural hematoma ([place]) 01/26/2017   bilateral   Patient Active Problem List   Diagnosis Date Noted  . History of bilateral hip replacements   . Memory disorder 12/20/2016  . History of lumbar laminectomy for spinal cord decompression 02/02/2016  . Atopic dermatitis 07/09/2015  . Reactive airway disease 08/11/2014  . History of cancer 08/11/2014  . OSA (obstructive sleep apnea) 08/11/2014  . Healthcare maintenance 07/17/2013  . Chronic kidney disease (CKD), stage III (moderate) 10/04/2012  . Schizophrenia, chronic condition ([place]) 01/20/2012  . Postmenopausal atrophic vaginitis 11/01/2010  . Chronic diastolic heart failure ([place]) 05/06/2010  . Allergic rhinitis 10/14/2009  . Obesity, Class II, BMI 35-39.9, with comorbidity 05/14/2009  . Chronic prescription opiate use 04/29/2008  . Normocytic anemia 09/25/2007  . Hyperlipidemia 09/03/2006  . Gout 09/03/2006  . Essential hypertension 09/03/2006  . GERD 09/03/2006  . Hx-TIA (transient ischemic attack) 05/13/2001   Past Surgical History:  Procedure Laterality Date  . ABDOMINAL HYSTERECTOMY  [place] Bilateral 02/12/2017   Procedure: Haskell Flirt;  Surgeon: Earnie Larsson, MD;  Location: [facility];  Service: Neurosurgery;  Laterality: Bilateral;  . CERVICAL DISCECTOMY  7/07   C5-C6  . JOINT REPLACEMENT     bilateral hip replacement  . LUMBAR DISC SURGERY     L5-S1 7/07  . LUMBAR LAMINECTOMY/DECOMPRESSION MICRODISCECTOMY N/A 02/02/2016   Procedure: L4-5 Decompression;  Surgeon: Marybelle Killings, MD;  Location: [facility];  Service: Orthopedics;  Laterality: N/A;  . [place] for  ovarian cancer    Family History  Problem Relation Age of Onset  . Diabetes Mother   . Heart disease Mother   . Hearing loss Mother   . Stroke Mother   . Post-traumatic stress disorder Father   . Schizophrenia Maternal Grandmother   . Diabetes Maternal Grandfather   . Other Brother        brother died in mental health hospital  . Kidney cancer Neg Hx   . Bladder Cancer Neg Hx     Medications- reviewed and updated Current Outpatient Prescriptions  Medication Sig Dispense Refill  . acetaminophen (TYLENOL) 500 MG tablet Take 500 mg by mouth every 8 (eight) hours as needed.    [name] allopurinol (ZYLOPRIM) 300 MG tablet TAKE 1 TABLET BY MOUTH DAILY 90 tablet 3  . amLODipine (NORVASC) 10 MG tablet TAKE 1 TABLET BY MOUTH EVERY DAY 90 tablet 3  . ascorbic acid (VITAMIN C) 500 MG tablet Take 500 mg by mouth daily.    [name] atorvastatin (LIPITOR) 40 MG tablet Take 1 tablet (40 mg total) by mouth daily. 90 tablet 3  . buPROPion (WELLBUTRIN XL) 300 MG 24 hr tablet Take 1 tablet (300 mg total) by mouth every morning. 30 tablet 2  . donepezil (ARICEPT) 5 MG tablet Take 1 tablet (5 mg total) by mouth at bedtime. 30 tablet 5  . fluocinonide cream (LIDEX) 0.05 % APPLY TO AFFECTED AREA TWICE DAILY 60 g 1  . fluticasone (FLONASE) 50 MCG/ACT nasal spray INSTILL 2 SPRAYS IN EACH NOSTRIL DAILY 16 g 4  . hydrOXYzine (VISTARIL) 25 MG capsule Take 1 capsule (25 mg total) by mouth at bedtime. 30 capsule 2  . losartan (COZAAR) 100 MG tablet Take 1 tablet (100 mg total) by mouth daily. 30 tablet 11  . Multiple Vitamins-Minerals (ALIVE WOMENS ENERGY) TABS Take 1 tablet by mouth daily.     [name] oxybutynin (DITROPAN-XL) 5 MG 24 hr tablet Take 5 mg by mouth at bedtime.    . pantoprazole (PROTONIX) 40 MG tablet TAKE 1 TABLET BY MOUTH TWICE DAILY BEFORE A MEAL 180 tablet 3  . potassium chloride (MICRO-K) 10 MEQ CR capsule TAKE 1 CAPSULE BY MOUTH DAILY 90 capsule 2  . traMADol (ULTRAM) 50 MG tablet Take 1 tablet (50 mg total)  by mouth 2 (two) times daily as needed. 60 tablet 2   No current facility-administered medications for this visit.     Allergies-reviewed and updated Allergies  Allergen Reactions  . Aspirin Other (See Comments)    Bleeding from vagina   . Sulfamethoxazole-Trimethoprim Itching    Social History   Social History  . Marital status: Widowed    Spouse name: N/A  . Number of children: 3  . Years of education: 15   Occupational History  . Retired    Social History Main Topics  . Smoking status: Former Smoker    Packs/day: 0.10    Types: Cigarettes  Quit date: 04/08/2013  . Smokeless tobacco: Never Used  . Alcohol use No  . Drug use: No  . Sexual activity: No   Other Topics Concern  . None   Social History Narrative   Lives with daughter in [place]. Daughter is primary caretaker, surrogate decision-maker, and arranges for [facility] aide and other caregivers to supervise Ms. Makar at home. Ambulatory   Objective:  Physical Exam: BP 110/70   Pulse 81   Ht 5\' 2"  (1.575 m)   Wt 218 lb (98.9 kg)   SpO2 98%   BMI 39.87 kg/m   Gen: NAD, resting comfortably CV: RRR with no murmurs appreciated Pulm: NWOB, CTAB with no crackles, wheezes, or rhonchi GI: Normal bowel sounds present. Soft, Nontender, Nondistended. MSK: - LE: 2-3+ pitting edema to knees bilaterally. RLE slightly larger than left. Mild venous stasis changes noted. Diffusely tender.  Skin: warm, dry Neuro: grossly normal, moves all extremities Psych: Normal affect and thought content  Assessment/Plan:  LE Edema Broad differential. Will check CBC, CMET, UA, TSH. Will also check echo to rule out heart failure (last echo 7 years ago with normal EF). Will also check D-dimer to rule out DVT (Wells Score 1). Recommended continued elevation of LE. Return precautions reviewed. Follow up in 1-2 weeks. Consider stopping norvasc if above work up normal.  Cystitis Check urine culture today. If positive will need  another round of antibiotics.  Elevated Creatinine Check CMET today.   Preventative Healthcare Flu shot given today.   Algis Greenhouse. [name], MD 07/17/2017 3:03 PM

## 2017-07-17 NOTE — Patient Instructions (Signed)
We will check blood work and urine today.  We will schedule an echocardiogram.   Come back after the echocardiogram to discuss next steps.  Keep your legs elevated. You can also try using a compresison stocking.  We can consider switching your amlodipine.  Take care,  Dr [name]

## 2017-07-18 ENCOUNTER — Other Ambulatory Visit: Payer: Self-pay | Admitting: Family Medicine

## 2017-07-18 DIAGNOSIS — M7989 Other specified soft tissue disorders: Secondary | ICD-10-CM

## 2017-07-18 LAB — URINALYSIS, ROUTINE W REFLEX MICROSCOPIC
Bilirubin Urine: NEGATIVE
Hgb urine dipstick: NEGATIVE
Ketones, ur: NEGATIVE
Leukocytes, UA: NEGATIVE
Nitrite: NEGATIVE
Specific Gravity, Urine: 1.025 (ref 1.000–1.030)
Total Protein, Urine: NEGATIVE
Urine Glucose: NEGATIVE
Urobilinogen, UA: 0.2 (ref 0.0–1.0)
pH: 6 (ref 5.0–8.0)

## 2017-07-18 LAB — COMPREHENSIVE METABOLIC PANEL
ALT: 20 U/L (ref 0–35)
AST: 18 U/L (ref 0–37)
Albumin: 4.2 g/dL (ref 3.5–5.2)
Alkaline Phosphatase: 104 U/L (ref 39–117)
BUN: 18 mg/dL (ref 6–23)
CO2: 26 mEq/L (ref 19–32)
Calcium: 9.6 mg/dL (ref 8.4–10.5)
Chloride: 109 mEq/L (ref 96–112)
Creatinine, Ser: 1.3 mg/dL — ABNORMAL HIGH (ref 0.40–1.20)
GFR: 51.23 mL/min — ABNORMAL LOW (ref >60.00)
Glucose, Bld: 89 mg/dL (ref 70–99)
Potassium: 4.3 mEq/L (ref 3.5–5.1)
Sodium: 143 mEq/L (ref 135–145)
Total Bilirubin: 0.4 mg/dL (ref 0.2–1.2)
Total Protein: 6.8 g/dL (ref 6.0–8.3)

## 2017-07-18 LAB — URINE CULTURE
MICRO NUMBER:: 81030116
Result:: NO GROWTH
SPECIMEN QUALITY:: ADEQUATE

## 2017-07-18 LAB — CBC
HCT: 36.9 % (ref 36.0–46.0)
Hemoglobin: 11.8 g/dL — ABNORMAL LOW (ref 12.0–15.0)
MCHC: 31.9 g/dL (ref 30.0–36.0)
MCV: 97.6 fl (ref 78.0–100.0)
Platelets: 236 10*3/uL (ref 150.0–400.0)
RBC: 3.79 Mil/uL — ABNORMAL LOW (ref 3.87–5.11)
RDW: 16.1 % — ABNORMAL HIGH (ref 11.5–15.5)
WBC: 7.8 10*3/uL (ref 4.0–10.5)

## 2017-07-18 LAB — TSH: TSH: 3.13 u[IU]/mL (ref 0.35–4.50)

## 2017-07-18 LAB — D-DIMER, QUANTITATIVE: D-Dimer, Quant: 1.12 mcg/mL FEU — ABNORMAL HIGH (ref <0.50)

## 2017-07-20 ENCOUNTER — Other Ambulatory Visit: Payer: Self-pay | Admitting: *Deleted

## 2017-07-20 NOTE — Patient Outreach (Signed)
[facility]) Care Management  07/20/2017  CAITLINN KLINKER 18-Feb-1941 [id number]   Referral received from insurance plan as high risk case.  Member has history of hypertension, heart failure, sleep apnea, TIA, hyperlipidemia, and schizophrenia per chart.  Per referral, request made to contact home health, [place], to obtain more information and collaborate on plan of care.  Call placed to [facility], voice mail left.  Will await call back.    Update @ 1630:  Voice message received back from [facility] stating member has not been active with [facility] since July.  This care manager will follow up with member and Melissa next week in attempt to determine needs.  [name], [place], MSN [place] [phone]

## 2017-07-23 ENCOUNTER — Ambulatory Visit: Payer: Self-pay

## 2017-07-23 ENCOUNTER — Other Ambulatory Visit: Payer: Self-pay

## 2017-07-23 ENCOUNTER — Telehealth: Payer: Self-pay | Admitting: Family Medicine

## 2017-07-23 ENCOUNTER — Ambulatory Visit (INDEPENDENT_AMBULATORY_CARE_PROVIDER_SITE_OTHER): Payer: PPO | Admitting: Podiatry

## 2017-07-23 ENCOUNTER — Other Ambulatory Visit: Payer: Self-pay | Admitting: *Deleted

## 2017-07-23 DIAGNOSIS — B351 Tinea unguium: Secondary | ICD-10-CM

## 2017-07-23 NOTE — Telephone Encounter (Signed)
Patient's daughter returning missed phone call and had questions about ultrasound patient has to have. Please call patient's daughter and advise.

## 2017-07-23 NOTE — Progress Notes (Signed)
   Subjective: Patient is a 76 year old female presenting today as a new patient with a complaint of possible fungal nails of the bilateral great toes. Patient states that the nails have been discolored and thickened for greater than 1 month. She states the left great toenail has completely fallen off from the nail bed. Patient presents today for further treatment and evaluation.   Past Medical History:  Diagnosis Date  . Chronic venous insufficiency   . Colon, diverticulosis Sept. 2011   outpatient colonoscopy by Dr. Cristina Gong.  Need record  . DDD (degenerative disc disease), lumbosacral   . Depression   . DJD (degenerative joint disease), multiple sites    Low back pain worst  . GERD (gastroesophageal reflux disease)   . Gout   . Gout    Uric Acid level 4.2 on 300 allopurinol  . History of cervical cancer 1972  . Hx-TIA (transient ischemic attack) 05/13/2001   Right facial numbness  . Hyperlipidemia   . Hypertension   . Memory disorder 12/20/2016  . Mitral valve prolapse 10/27/1999   Subsequent 2D echo show normal mitral valve  . Osteoarthritis of hip    bilateral hips  . Ovarian cancer ([place]) 1972   S/P oophorectomy  . Psychosis   . Recurrent boils    History of MRSA skin infections with abscess  . Sensorineural hearing loss of both ears   . Subdural hematoma ([place]) 01/26/2017   bilateral    Objective: Physical Exam General: The patient is alert and oriented x3 in no acute distress.  Dermatology: Hyperkeratotic, discolored, thickened, onychodystrophy of great toenails noted bilaterally.  Skin is warm, dry and supple bilateral lower extremities. Negative for open lesions or macerations.  Vascular: Palpable pedal pulses bilaterally. No edema or erythema noted. Capillary refill within normal limits.  Neurological: Epicritic and protective threshold grossly intact bilaterally.   Musculoskeletal Exam: Range of motion within normal limits to all pedal and ankle joints  bilateral. Muscle strength 5/5 in all groups bilateral.   Assessment: #1 onychodystrophy bilateral great toenails #2 possible onychomycosis #3 hyperkeratotic nails bilateral great toenails  Plan of Care:  #1 Patient was evaluated. #2 Mechanical debridement of bilateral great toenails performed using a nail nipper. Filed with dremel without incident.  #3 Continue using OTC Kerasol. #4 Return to clinic when necessary.    Edrick Kins, DPM [facility]  Dr. Edrick Kins, [place]                                        [place], [place] [address]                Office [phone]  Fax [phone]

## 2017-07-23 NOTE — Patient Outreach (Signed)
[facility]) Care Management  07/23/2017  [name] 1941/07/24 [id number]   Call placed to member to introduce [facility] services.  Spoke with daughter [name], member's identity verified.  This care manager introduced self and discussed [facility] care management services including benefits, rights/responsibilities.  Ms. [name] report that member has had several falls over the past year, one of which resulted in subdural hematoma requring surgery.  Member has had a stay at inpatient rehab as well as received therapy in the home.  She was previously denied Medicaid, but was provided with home aide for 8 hours/week.    Daughter provides as much support as she is able to as she currently work full time.  Member lives alone and daughter feel she is in need of additional hours/assistance.  Daughter report that the member has refused [facility] in the past, but she is encouraging her to reconsider due to current circumstances.  Receptive to discussing options with LCSW.    Daughter report nursing needs are related to falls and now swollen legs as well as recurrent UTIs.  There is concern for blood clots as D-dimer is elevated, doppler studies on legs have been ordered.  She has echocardiogram this week, hoping to have doppler studies done same day.  Follow up with primary MD next week.  Agree to home visit, scheduled for within the next 2 weeks.  Provided Ms. [name] with contact information for this care manager, encouraged to contact with questions/concerns.  [name], [place], MSN [place] [phone]

## 2017-07-23 NOTE — Telephone Encounter (Signed)
Patient's daughter called to get an update on the order for the echocardiogram for the patient. I advised her that the order was in the system and provided her with the number for the Cardiac center at [facility] on [address]. I advised that if there is any issues with getting the patient scheduled to call our office and we will take care of it per Lanny Hurst, referral coordinator.

## 2017-07-23 NOTE — Telephone Encounter (Signed)
Pending echo appointment on 07/26/2017. Can you reach out to daughter about scheduling the Bilateral leg [place]?

## 2017-07-24 ENCOUNTER — Other Ambulatory Visit: Payer: Self-pay | Admitting: *Deleted

## 2017-07-24 ENCOUNTER — Encounter: Payer: Self-pay | Admitting: *Deleted

## 2017-07-24 ENCOUNTER — Telehealth: Payer: Self-pay | Admitting: *Deleted

## 2017-07-24 NOTE — Telephone Encounter (Signed)
Pt appears to have transferred care. pls send back to pharmacy.

## 2017-07-24 NOTE — Patient Outreach (Signed)
[facility]) Care Management  07/24/2017  RANAE CASEBIER 1940/12/01 [id number]   CSW made an initial attempt to try and contact patient today to perform phone assessment, as well as assess and assist with social needs and services, without success.  A HIPAA compliant message was left for patient on voicemail.  CSW is currently awaiting a return call. CSW will make a second outreach attempt within the next week, if CSW does not receive a return call from patient in the meantime. [name], BSW, MSW, LCSW  Licensed Education officer, environmental Health System  Mailing [place] [address] [place], [place] [address] Physical Address-[address], [place], [place] [address] Toll Free Main # [phone] Fax # [phone] Cell # [phone]  Office # [phone] [email or web address]

## 2017-07-24 NOTE — Telephone Encounter (Signed)
Received fax from pharmacy in ref to Furosemide 20mg  BID PRN "The patient reports this medication has been discontinued. Pls confirm this for our records." Will leave form in MD box.

## 2017-07-25 ENCOUNTER — Other Ambulatory Visit (HOSPITAL_COMMUNITY): Payer: Self-pay

## 2017-07-26 ENCOUNTER — Ambulatory Visit (HOSPITAL_COMMUNITY): Payer: PPO | Attending: Cardiology

## 2017-07-26 ENCOUNTER — Other Ambulatory Visit: Payer: Self-pay

## 2017-07-26 ENCOUNTER — Ambulatory Visit
Admission: RE | Admit: 2017-07-26 | Discharge: 2017-07-26 | Disposition: A | Discharge status: Home / Self Care | Payer: PPO | Source: Ambulatory Visit | Attending: Family Medicine | Admitting: Family Medicine

## 2017-07-26 DIAGNOSIS — Z8673 Personal history of transient ischemic attack (TIA), and cerebral infarction without residual deficits: Secondary | ICD-10-CM | POA: Insufficient documentation

## 2017-07-26 DIAGNOSIS — M7989 Other specified soft tissue disorders: Secondary | ICD-10-CM | POA: Diagnosis not present

## 2017-07-26 DIAGNOSIS — I119 Hypertensive heart disease without heart failure: Secondary | ICD-10-CM | POA: Diagnosis not present

## 2017-07-26 DIAGNOSIS — R600 Localized edema: Secondary | ICD-10-CM | POA: Diagnosis not present

## 2017-07-26 DIAGNOSIS — I872 Venous insufficiency (chronic) (peripheral): Secondary | ICD-10-CM | POA: Diagnosis not present

## 2017-07-26 DIAGNOSIS — E785 Hyperlipidemia, unspecified: Secondary | ICD-10-CM | POA: Diagnosis not present

## 2017-07-26 DIAGNOSIS — I358 Other nonrheumatic aortic valve disorders: Secondary | ICD-10-CM | POA: Diagnosis not present

## 2017-07-30 ENCOUNTER — Other Ambulatory Visit: Payer: Self-pay | Admitting: *Deleted

## 2017-07-30 NOTE — Progress Notes (Signed)
PCP notes:   Health maintenance: None.   Abnormal screenings: None.   Patient concerns: Bilateral lower extremity pain.    Nurse concerns: None.   Next PCP appt: 07/31/17 3:15

## 2017-07-30 NOTE — Patient Outreach (Addendum)
[facility]) Care Management  07/30/2017  [name] 01-30-1941 [id number]   CSW made an attempt to try and contact patient's daughter today to perform phone assessment, as well as assess and assist with social work needs and services; however, [name] was unavailable at the time of CSW's call.  This was CSW's second attempt to try and outreach to patient and/or [name], without success.  CSW was unable to leave a HIPAA compliant message for patient on voicemail, receiving an automated recording indicating that "mailbox is full and cannot take any new messages".  CSW will make a third and final outreach attempt to [name] within the next week. [name], BSW, MSW, LCSW  Licensed Education officer, environmental Health System  Mailing [place] [address] [place], [place] [address] Physical Address-[address], [place], [place] [address] Toll Free Main # [phone] Fax # [phone] Cell # [phone]  Office # [phone] [email or web address]

## 2017-07-30 NOTE — Progress Notes (Signed)
Pre visit review using our clinic review tool, if applicable. No additional management support is needed unless otherwise documented below in the visit note. 

## 2017-07-30 NOTE — Progress Notes (Signed)
Subjective:   [name] is a 76 y.o. female who presents for an Initial Medicare Annual Wellness Visit.  Review of Systems    No ROS.  Medicare Wellness Visit. Additional risk factors are reflected in the social history.   Cardiac Risk Factors include: advanced age (>48men, >29 women);dyslipidemia;hypertension;obesity (BMI >30kg/m2);sedentary lifestyle     Objective:    Today's Vitals   07/31/17 1437  BP: 122/67  Pulse: 76  Resp: 16  Temp: 98.5 F (36.9 C)  TempSrc: Oral  SpO2: 96%  Weight: 217 lb 6.4 oz (98.6 kg)  Height: 5\' 2"  (1.575 m)   Body mass index is 39.76 kg/m.   Current Medications (verified) Outpatient Encounter Prescriptions as of 07/31/2017  Medication Sig  . acetaminophen (TYLENOL) 500 MG tablet Take 500 mg by mouth every 8 (eight) hours as needed.  [name] allopurinol (ZYLOPRIM) 300 MG tablet TAKE 1 TABLET BY MOUTH DAILY  . amLODipine (NORVASC) 10 MG tablet TAKE 1 TABLET BY MOUTH EVERY DAY  . ascorbic acid (VITAMIN C) 500 MG tablet Take 500 mg by mouth daily.  [name] atorvastatin (LIPITOR) 40 MG tablet Take 1 tablet (40 mg total) by mouth daily.  [name] buPROPion (WELLBUTRIN XL) 300 MG 24 hr tablet Take 1 tablet (300 mg total) by mouth every morning.  . donepezil (ARICEPT) 5 MG tablet Take 1 tablet (5 mg total) by mouth at bedtime.  . fluticasone (FLONASE) 50 MCG/ACT nasal spray INSTILL 2 SPRAYS IN EACH NOSTRIL DAILY  . hydrOXYzine (VISTARIL) 25 MG capsule Take 1 capsule (25 mg total) by mouth at bedtime.  [name] losartan (COZAAR) 100 MG tablet Take 1 tablet (100 mg total) by mouth daily.  . Multiple Vitamins-Minerals (ALIVE WOMENS ENERGY) TABS Take 1 tablet by mouth daily.   . pantoprazole (PROTONIX) 40 MG tablet TAKE 1 TABLET BY MOUTH TWICE DAILY BEFORE A MEAL  . potassium chloride (MICRO-K) 10 MEQ CR capsule TAKE 1 CAPSULE BY MOUTH DAILY  . traMADol (ULTRAM) 50 MG tablet Take 1 tablet (50 mg total) by mouth 2 (two) times daily as needed.  [name] oxybutynin  (DITROPAN-XL) 5 MG 24 hr tablet Take 5 mg by mouth at bedtime.  . [DISCONTINUED] fluocinonide cream (LIDEX) 0.05 % APPLY TO AFFECTED AREA TWICE DAILY (Patient not taking: Reported on 07/31/2017)   No facility-administered encounter medications on file as of 07/31/2017.     Allergies (verified) Aspirin and Sulfamethoxazole-trimethoprim   History: Past Medical History:  Diagnosis Date  . Chronic venous insufficiency   . Colon, diverticulosis Sept. 2011   outpatient colonoscopy by Dr. Cristina Gong.  Need record  . DDD (degenerative disc disease), lumbosacral   . Depression   . DJD (degenerative joint disease), multiple sites    Low back pain worst  . GERD (gastroesophageal reflux disease)   . Gout   . Gout    Uric Acid level 4.2 on 300 allopurinol  . History of cervical cancer 1972  . Hx-TIA (transient ischemic attack) 05/13/2001   Right facial numbness  . Hyperlipidemia   . Hypertension   . Memory disorder 12/20/2016  . Mitral valve prolapse 10/27/1999   Subsequent 2D echo show normal mitral valve  . Osteoarthritis of hip    bilateral hips  . Ovarian cancer ([place]) 1972   S/P oophorectomy  . Psychosis ([place])   . Recurrent boils    History of MRSA skin infections with abscess  . Sensorineural hearing loss of both ears   . Subdural hematoma ([place]) 01/26/2017   bilateral  Past Surgical History:  Procedure Laterality Date  . ABDOMINAL HYSTERECTOMY     1972  . BURR HOLE Bilateral 02/12/2017   Procedure: Haskell Flirt;  Surgeon: Earnie Larsson, MD;  Location: [facility];  Service: Neurosurgery;  Laterality: Bilateral;  . CERVICAL DISCECTOMY  7/07   C5-C6  . JOINT REPLACEMENT     bilateral hip replacement  . LUMBAR DISC SURGERY     L5-S1 7/07  . LUMBAR LAMINECTOMY/DECOMPRESSION MICRODISCECTOMY N/A 02/02/2016   Procedure: L4-5 Decompression;  Surgeon: Marybelle Killings, MD;  Location: [facility];  Service: Orthopedics;  Laterality: N/A;  . [place] for ovarian cancer   Family History    Problem Relation Age of Onset  . Diabetes Mother   . Heart disease Mother   . Hearing loss Mother   . Stroke Mother   . Post-traumatic stress disorder Father   . Schizophrenia Maternal Grandmother   . Diabetes Maternal Grandfather   . Other Brother        brother died in mental health hospital  . Kidney cancer Neg Hx   . Bladder Cancer Neg Hx    Social History   Occupational History  . Retired    Social History Main Topics  . Smoking status: Former Smoker    Packs/day: 0.10    Types: Cigarettes    Quit date: 04/08/2013  . Smokeless tobacco: Never Used  . Alcohol use No  . Drug use: No  . Sexual activity: No    Tobacco Counseling Counseling given: Not Answered   Activities of Daily Living In your present state of health, do you have any difficulty performing the following activities: 07/31/2017 06/07/2017  Hearing? N Y  Vision? N -  Difficulty concentrating or making decisions? N -  Walking or climbing stairs? Y Y  Comment Uses walker -  Dressing or bathing? N Y  Doing errands, shopping? Tempie Donning  Preparing Food and eating ? N -  Using the Toilet? N -  In the past six months, have you accidently leaked urine? Y -  Comment Wears pads. -  Do you have problems with loss of bowel control? N -  Managing your Medications? N -  Managing your Finances? N -  Housekeeping or managing your Housekeeping? Y -  Some recent data might be hidden    Immunizations and Health Maintenance Immunization History  Administered Date(s) Administered  . Influenza Split 08/03/2011, 07/30/2012  . Influenza Whole 08/09/2007, 07/27/2008, 07/26/2009, 07/20/2010  . Influenza, High Dose Seasonal PF 07/17/2017  . Influenza,inj,Quad PF,6+ Mos 07/17/2013, 07/08/2015, 08/03/2016  . Influenza-Unspecified 07/18/2014  . Pneumococcal Conjugate-13 02/11/2015  . Pneumococcal Polysaccharide-23 08/26/2010, 11/02/2016  . Td 05/07/2009  . Zoster 10/09/2012   There are no preventive care reminders to  display for this patient.  Patient Care Team: Vivi Barrack, MD as PCP - General (Family Medicine) Adele Schilder, Arlyce Harman, MD as Consulting Physician (Psychiatry) [name], RN as [place], LCSW as Randall, Timothy, MD as Consulting Physician (Ophthalmology) Marybelle Killings, MD as Consulting Physician (Orthopedic Surgery)  Indicate any recent Medical Services you may have received from other than [facility] providers in the past year (date may be approximate).     Assessment:   This is a routine wellness examination for Kizzy. Physical assessment deferred to PCP.  Hearing/Vision screen Hearing Screening Comments: Wears hearing aid to right ear.  Vision Screening Comments: Dr Tommy Rainwater. Appt this  month.  Dietary issues and exercise activities discussed: Current Exercise Habits: The patient does not participate in regular exercise at present, Exercise limited by: None identified  Goals    . Weight < 205 lb (92.987 kg)      Depression Screen PHQ 2/9 Scores 06/07/2017 04/11/2017 03/29/2017 01/04/2017 11/02/2016 08/03/2016 05/03/2016  PHQ - 2 Score 6 1 1  0 0 1 0  PHQ- 9 Score 16 5 - - - - -    Fall Risk Fall Risk  07/31/2017 06/07/2017 03/29/2017 01/04/2017 01/04/2017  Falls in the past year? Yes Yes Yes Yes No  Comment - - - - -  Number falls in past yr: 2 or more 2 or more 2 or more 1 -  Comment - - - - -  Injury with Fall? Yes Yes Yes No -  Risk Factor Category  High Fall Risk High Fall Risk High Fall Risk - -  Risk for fall due to : History of fall(s);Impaired balance/gait;Impaired mobility History of fall(s);Impaired balance/gait;Medication side effect History of fall(s);Impaired balance/gait;Impaired mobility Impaired balance/gait;Impaired mobility -  Risk for fall due to: Comment - - - - -  Follow up Education provided;Falls prevention discussed Falls prevention discussed Falls prevention discussed - -     Cognitive Function: MMSE - Mini Mental State Exam 06/20/2017 01/26/2017 12/20/2016  Orientation to time 3 5 4   Orientation to Place 4 5 5   Registration 2 3 3   Attention/ Calculation 0 5 5  Recall 1 3 1   Language- name 2 objects 2 2 2   Language- repeat 1 1 1   Language- follow 3 step command 3 3 3   Language- read & follow direction 1 1 1   Write a sentence 1 1 1   Copy design 1 0 0  Total score 19 29 26         Screening Tests Health Maintenance  Topic Date Due  . MAMMOGRAM  07/06/2018  . TETANUS/TDAP  05/08/2019  . COLONOSCOPY  07/29/2020  . INFLUENZA VACCINE  Completed  . DEXA SCAN  Completed  . PNA vac Low Risk Adult  Completed      Plan:   Follow up with PCP as directed.  I have personally reviewed and noted the following in the patient's chart:   . Medical and social history . Use of alcohol, tobacco or illicit drugs  . Current medications and supplements . Functional ability and status . Nutritional status . Physical activity . Advanced directives . List of other physicians . Vitals . Screenings to include cognitive, depression, and falls . Referrals and appointments  In addition, I have reviewed and discussed with patient certain preventive protocols, quality metrics, and best practice recommendations. A written personalized care plan for preventive services as well as general preventive health recommendations were provided to patient.     Ree Edman, RN   07/31/2017

## 2017-07-31 ENCOUNTER — Encounter: Payer: Self-pay | Admitting: *Deleted

## 2017-07-31 ENCOUNTER — Ambulatory Visit: Payer: Self-pay | Admitting: Family Medicine

## 2017-07-31 ENCOUNTER — Ambulatory Visit (INDEPENDENT_AMBULATORY_CARE_PROVIDER_SITE_OTHER): Payer: PPO | Admitting: Family Medicine

## 2017-07-31 ENCOUNTER — Ambulatory Visit: Payer: PPO | Admitting: *Deleted

## 2017-07-31 ENCOUNTER — Encounter: Payer: Self-pay | Admitting: Family Medicine

## 2017-07-31 VITALS — BP 122/64 | HR 76 | Temp 98.5°F | Ht 62.0 in | Wt 217.2 lb

## 2017-07-31 VITALS — BP 122/67 | HR 76 | Temp 98.5°F | Resp 16 | Ht 62.0 in | Wt 217.4 lb

## 2017-07-31 DIAGNOSIS — R600 Localized edema: Secondary | ICD-10-CM | POA: Diagnosis not present

## 2017-07-31 DIAGNOSIS — Z0000 Encounter for general adult medical examination without abnormal findings: Secondary | ICD-10-CM

## 2017-07-31 DIAGNOSIS — I10 Essential (primary) hypertension: Secondary | ICD-10-CM | POA: Diagnosis not present

## 2017-07-31 DIAGNOSIS — R350 Frequency of micturition: Secondary | ICD-10-CM

## 2017-07-31 DIAGNOSIS — R5381 Other malaise: Secondary | ICD-10-CM

## 2017-07-31 MED ORDER — HYDROCHLOROTHIAZIDE 12.5 MG PO TABS: 12.5000 mg | ORAL_TABLET | Freq: Every day | ORAL | 3 refills | Status: DC

## 2017-07-31 NOTE — Assessment & Plan Note (Addendum)
Likely secondary to venous insufficiency. She does have grade 1 diastolic dysfunction which may be contributing, otherwise her workup this far has been negative. Stop amlodipine today to see if that improves her symptoms. Also start HCTZ to see if she may improve with this. Referral placed to physical therapy to help her regain some conditioning that she has lost over the past several months. Follow up in 1 month. Consider Unna boots or other compression devices if not improving.

## 2017-07-31 NOTE — Progress Notes (Signed)
I have personally reviewed the Medicare Annual Wellness Visit and agree with the assessment and plan.  [name]. [name], MD 07/31/2017 3:19 PM

## 2017-07-31 NOTE — Patient Instructions (Signed)
[name] , Thank you for taking time to come for your Medicare Wellness Visit. I appreciate your ongoing commitment to your health goals. Please review the following plan we discussed and let me know if I can assist you in the future.   These are the goals we discussed: Goals    . Weight < 205 lb (92.987 kg)       This is a list of the screening recommended for you and due dates:  Health Maintenance  Topic Date Due  . Mammogram  07/06/2018  . Tetanus Vaccine  05/08/2019  . Colon Cancer Screening  07/29/2020  . Flu Shot  Completed  . DEXA scan (bone density measurement)  Completed  . Pneumonia vaccines  Completed   Preventive Care for Adults  A healthy lifestyle and preventive care can promote health and wellness. Preventive health guidelines for adults include the following key practices.  . A routine yearly physical is a good way to check with your health care provider about your health and preventive screening. It is a chance to share any concerns and updates on your health and to receive a thorough exam.  . Visit your dentist for a routine exam and preventive care every 6 months. Brush your teeth twice a day and floss once a day. Good oral hygiene prevents tooth decay and gum disease.  . The frequency of eye exams is based on your age, health, family medical history, use  of contact lenses, and other factors. Follow your health care provider's ecommendations for frequency of eye exams.  . Eat a healthy diet. Foods like vegetables, fruits, whole grains, low-fat dairy products, and lean protein foods contain the nutrients you need without too many calories. Decrease your intake of foods high in solid fats, added sugars, and salt. Eat the right amount of calories for you. Get information about a proper diet from your health care provider, if necessary.  . Regular physical exercise is one of the most important things you can do for your health. Most adults should get at least 150  minutes of moderate-intensity exercise (any activity that increases your heart rate and causes you to sweat) each week. In addition, most adults need muscle-strengthening exercises on 2 or more days a week.  Silver Sneakers may be a benefit available to you. To determine eligibility, you may visit the website: www.silversneakers.com or contact program at 779-172-5593 Mon-Fri between 8AM-8PM.   . Maintain a healthy weight. The body mass index (BMI) is a screening tool to identify possible weight problems. It provides an estimate of body fat based on height and weight. Your health care provider can find your BMI and can help you achieve or maintain a healthy weight.   For adults 20 years and older: ? A BMI below 18.5 is considered underweight. ? A BMI of 18.5 to 24.9 is normal. ? A BMI of 25 to 29.9 is considered overweight. ? A BMI of 30 and above is considered obese.   . Maintain normal blood lipids and cholesterol levels by exercising and minimizing your intake of saturated fat. Eat a balanced diet with plenty of fruit and vegetables. Blood tests for lipids and cholesterol should begin at age 44 and be repeated every 5 years. If your lipid or cholesterol levels are high, you are over 50, or you are at high risk for heart disease, you may need your cholesterol levels checked more frequently. Ongoing high lipid and cholesterol levels should be treated with medicines if diet and  exercise are not working.  . If you smoke, find out from your health care provider how to quit. If you do not use tobacco, please do not start.  . If you choose to drink alcohol, please do not consume more than 2 drinks per day. One drink is considered to be 12 ounces (355 mL) of beer, 5 ounces (148 mL) of wine, or 1.5 ounces (44 mL) of liquor.  . If you are 42-65 years old, ask your health care provider if you should take aspirin to prevent strokes.  . Use sunscreen. Apply sunscreen liberally and repeatedly throughout  the day. You should seek shade when your shadow is shorter than you. Protect yourself by wearing long sleeves, pants, a wide-brimmed hat, and sunglasses year round, whenever you are outdoors.  . Once a month, do a whole body skin exam, using a mirror to look at the skin on your back. Tell your health care provider of new moles, moles that have irregular borders, moles that are larger than a pencil eraser, or moles that have changed in shape or color.

## 2017-07-31 NOTE — Assessment & Plan Note (Signed)
Likely secondary to overactive bladder. Reflects under symptoms. Check urinalysis and urine culture today to rule out UTI. Encouraged follow-up with urology if symptoms not improving.

## 2017-07-31 NOTE — Assessment & Plan Note (Signed)
At goal. Stop amlodipine given her low extremity swelling. Start HCTZ 12.5 mg daily. Follow-up in 3-4 weeks. Repeat BMET at that time.

## 2017-07-31 NOTE — Progress Notes (Signed)
    Subjective:  [name] is a 76 y.o. female who presents today with a chief complaint of frequent urination.   HPI:  Frequent Urination, Chronic Problem Patient saw urologist several months ago and stopped her oxybutynin due to having a UTI. She now wears adult underwear and pants due to her excessive urination. No dysuria. No fevers or chills. However, she does report occasionally having a episode of sweating. She completed a course of antibiotics approximately one month ago for preserved UTI. Symptoms have remained relatively stable since then.  Lower Leg Swelling, chronic problem, stable Patient seen last month for this symptom last month. Underwent work up at that time including relatively normal echocardiogram, and normal telemetry Dopplers. She also had normal TSH and normal CBC. She has been on Lasix for this in the past. She does have some pain in her legs related to this. Overall, symptoms are relatively stable since our last visit.  Hypertension, Chronic Problem, Stable BP Readings from Last 3 Encounters:  07/31/17 122/64  07/31/17 122/67  07/17/17 110/70   Current Medications: Amlodipine 10 mg daily, losartan 100 mg daily, compliant without side effects.  ROS: Denies any chest pain, shortness of breath, dyspnea on exertion  ROS: Per HPI  PMH: Smoking history reviewed. Former smoker   Objective:  Physical Exam: BP 122/64   Pulse 76   Temp 98.5 F (36.9 C) (Oral)   Ht 5\' 2"  (1.575 m)   Wt 217 lb 4 oz (98.5 kg)   SpO2 96%   BMI 39.74 kg/m   Gen: NAD, resting comfortably MSK: 2-3+ pretibial edema bilaterally. Chronic venous stasis changes noted.  Assessment/Plan:  Frequent urination Likely secondary to overactive bladder. Reflects under symptoms. Check urinalysis and urine culture today to rule out UTI. Encouraged follow-up with urology if symptoms not improving.  Lower extremity edema Likely secondary to venous insufficiency. She does have grade 1  diastolic dysfunction which may be contributing, otherwise her workup this far has been negative. Stop amlodipine today to see if that improves her symptoms. Also start HCTZ to see if she may improve with this. Referral placed to physical therapy to help her regain some conditioning that she has lost over the past several months. Follow up in 1 month. Consider Unna boots or other compression devices if not improving.  Essential hypertension At goal. Stop amlodipine given her low extremity swelling. Start HCTZ 12.5 mg daily. Follow-up in 3-4 weeks. Repeat BMET at that time.  Algis Greenhouse. [name], MD 07/31/2017 3:36 PM

## 2017-08-01 LAB — URINE CULTURE
MICRO NUMBER:: 81092355
Result:: NO GROWTH
SPECIMEN QUALITY:: ADEQUATE

## 2017-08-01 LAB — URINALYSIS, ROUTINE W REFLEX MICROSCOPIC
Bilirubin Urine: NEGATIVE
Hgb urine dipstick: NEGATIVE
Ketones, ur: NEGATIVE
Leukocytes, UA: NEGATIVE
Nitrite: NEGATIVE
RBC / HPF: NONE SEEN (ref 0–?)
Specific Gravity, Urine: 1.01 (ref 1.000–1.030)
Total Protein, Urine: NEGATIVE
Urine Glucose: NEGATIVE
Urobilinogen, UA: 0.2 (ref 0.0–1.0)
WBC, UA: NONE SEEN (ref 0–?)
pH: 6 (ref 5.0–8.0)

## 2017-08-02 ENCOUNTER — Other Ambulatory Visit: Payer: Self-pay | Admitting: Family Medicine

## 2017-08-02 ENCOUNTER — Encounter: Payer: Self-pay | Admitting: *Deleted

## 2017-08-02 MED ORDER — HYDROCHLOROTHIAZIDE 12.5 MG PO TABS: 12.5000 mg | ORAL_TABLET | Freq: Every day | ORAL | 0 refills | Status: DC

## 2017-08-02 NOTE — Telephone Encounter (Signed)
Rx sent to local pharmacy as requested. 

## 2017-08-02 NOTE — Telephone Encounter (Signed)
The hydrochlorothiazide is not able to be mailed out until next Wednesday. Call the rx into [facility], [place] - [address] . Do not recall the medication from the mailing pharmacy.

## 2017-08-03 DIAGNOSIS — I509 Heart failure, unspecified: Secondary | ICD-10-CM | POA: Diagnosis not present

## 2017-08-03 DIAGNOSIS — R600 Localized edema: Secondary | ICD-10-CM | POA: Diagnosis not present

## 2017-08-06 ENCOUNTER — Other Ambulatory Visit: Payer: Self-pay | Admitting: *Deleted

## 2017-08-06 ENCOUNTER — Encounter: Payer: Self-pay | Admitting: *Deleted

## 2017-08-06 NOTE — Patient Outreach (Signed)
[name]) Care Management   08/06/2017  [name] June 27, 1941 [id number]  [name] is an 76 y.o. female  Subjective:   Member alert and oriented x3, complains of pain in her right leg, denies chest discomfort or difficulty breathing.  Report compliance with medications, uses pill box prepared by daughter.  Objective:   Review of Systems  Constitutional: Negative.   HENT: Negative.   Eyes: Negative.   Respiratory: Negative.   Cardiovascular: Positive for leg swelling.       Right leg  Gastrointestinal: Negative.   Genitourinary: Negative.   Musculoskeletal: Positive for falls.  Skin: Negative.   Neurological: Negative.   Endo/Heme/Allergies: Negative.   Psychiatric/Behavioral: Negative.     Physical Exam  Constitutional: She is oriented to person, place, and time. She appears well-developed and well-nourished.  Neck: Normal range of motion.  Cardiovascular: Normal rate, regular rhythm and normal heart sounds.   Respiratory: Effort normal and breath sounds normal.  GI: Soft. Bowel sounds are normal.  Musculoskeletal: Normal range of motion.  Neurological: She is alert and oriented to person, place, and time.  Skin: Skin is warm and dry.   BP 133/85 (BP Location: Left Wrist, Patient Position: Sitting, Cuff Size: Normal)   Pulse 80   Ht 1.575 m (5' 2" )   Wt 215 lb (97.5 kg)   SpO2 97%   BMI 39.32 kg/m   Encounter Medications:   Outpatient Encounter Prescriptions as of 08/06/2017  Medication Sig Note  . acetaminophen (TYLENOL) 500 MG tablet Take 500 mg by mouth every 8 (eight) hours as needed.   [name] allopurinol (ZYLOPRIM) 300 MG tablet TAKE 1 TABLET BY MOUTH DAILY   . ascorbic acid (VITAMIN C) 500 MG tablet Take 500 mg by mouth daily.   [name] atorvastatin (LIPITOR) 40 MG tablet Take 1 tablet (40 mg total) by mouth daily.   [name] buPROPion (WELLBUTRIN XL) 300 MG 24 hr tablet Take 1 tablet (300 mg total) by mouth every morning.   . donepezil  (ARICEPT) 5 MG tablet Take 1 tablet (5 mg total) by mouth at bedtime.   . fluticasone (FLONASE) 50 MCG/ACT nasal spray INSTILL 2 SPRAYS IN EACH NOSTRIL DAILY 04/10/2017: PRN  . hydrochlorothiazide (HYDRODIURIL) 12.5 MG tablet Take 1 tablet (12.5 mg total) by mouth daily.   . hydrOXYzine (VISTARIL) 25 MG capsule Take 1 capsule (25 mg total) by mouth at bedtime.   [name] losartan (COZAAR) 100 MG tablet Take 1 tablet (100 mg total) by mouth daily.   . Multiple Vitamins-Minerals (ALIVE WOMENS ENERGY) TABS Take 1 tablet by mouth daily.    . pantoprazole (PROTONIX) 40 MG tablet TAKE 1 TABLET BY MOUTH TWICE DAILY BEFORE A MEAL   . potassium chloride (MICRO-K) 10 MEQ CR capsule TAKE 1 CAPSULE BY MOUTH DAILY   . traMADol (ULTRAM) 50 MG tablet Take 1 tablet (50 mg total) by mouth 2 (two) times daily as needed.   [name] oxybutynin (DITROPAN-XL) 5 MG 24 hr tablet Take 5 mg by mouth at bedtime.    No facility-administered encounter medications on file as of 08/06/2017.     Functional Status:   In your present state of health, do you have any difficulty performing the following activities: 08/06/2017 07/31/2017  Hearing? Y N  Comment Hearing Aid -  Vision? Y N  Difficulty concentrating or making decisions? Y N  Walking or climbing stairs? Y Y  Comment - Uses walker  Dressing or bathing? N N  Doing errands, shopping?  Tempie Donning  Preparing Food and eating ? N N  Using the Toilet? N N  In the past six months, have you accidently leaked urine? Y Y  Comment - Wears pads.  Do you have problems with loss of bowel control? N N  Managing your Medications? Y N  Managing your Finances? Y N  Housekeeping or managing your Housekeeping? Y Y  Some recent data might be hidden    Fall/Depression Screening:    Fall Risk  08/06/2017 07/31/2017 06/07/2017  Falls in the past year? Yes Yes Yes  Comment - - -  Number falls in past yr: 2 or more 2 or more 2 or more  Comment - - -  Injury with Fall? Yes Yes Yes  Risk Factor Category   High Fall Risk High Fall Risk High Fall Risk  Risk for fall due to : History of fall(s) History of fall(s);Impaired balance/gait;Impaired mobility History of fall(s);Impaired balance/gait;Medication side effect  Risk for fall due to: Comment - - -  Follow up Education provided;Falls prevention discussed Education provided;Falls prevention discussed Falls prevention discussed   PHQ 2/9 Scores 08/06/2017 06/07/2017 04/11/2017 03/29/2017 01/04/2017 11/02/2016 08/03/2016  PHQ - 2 Score 0 6 1 1  0 0 1  PHQ- 9 Score - 16 5 - - - -    Assessment:    Met with member at scheduled time, home aide, Sasha, present during visit.  Attempted to contact member's daughter/caregiver, no answer, unable to leave voice message as mailbox is full.    Educated on [facility] care management services, consent signed.  Discussed concerns of daughter regarding ability for member to care for herself in her home alone.  She state she was staying with her daughter, but was ready to come home.  She have home aide 8 hours/week, otherwise is home alone.  She is made aware that LCSW was contacted as well regarding concern.  Discussed the option of adult day program such as ACE/PACE, but she refuses to consider option.    Able to complete initial assessment, however member derails from topic often, needing redirecting.  She report her main concern is increasing her mobility.  Discussed possibility of having referral placed for in home physical therapy, member state she prefer to have outpatient therapy.  She had vascular studies complete on her legs to rule out blood clots as well as echocardiogram, results were normal per MD note.  She has follow up with primary MD later this month as well as appointment with ortho.  Will have daughter discuss need for physical therapy during ortho appointment.   Member provided with [facility] calendar tool book.  She has blood pressure monitor, home aide report she is able to assist member with regular blood pressure  checks.  Blood pressure log provided with calendar, advised to record readings and take to primary MD appointment to review trends.  Provided member with contact information for this care manager, advised to contact with concerns.  Plan:   Will follow up with daughter regarding home visit within the next 2 weeks. Will follow up with member with home visit next month.  THN CM Care Plan Problem One     Most Recent Value  Care Plan Problem One  Risk for hospitalization related to fall as evidenced by high fall risk  Role Documenting the Problem One  Care Management [place] for Problem One  Active  [facility] Long Term Goal   Member will not have a fall within the next  45 days  THN Long Term Goal Start Date  08/06/17  Interventions for Problem One Long Term Goal  Educated member on fall preventions/interventions.  Advised to use walker when ambulating in the home.  Provided with fall safety checklist.  THN CM Short Term Goal #1   Member will keep and attend appointment with ortho MD within the next 3 weeks  THN CM Short Term Goal #1 Start Date  08/06/17  Interventions for Short Term Goal #1  Confirmed appointment via chart, confirmed that member does not need transportation.  Advised of importance of attending appointment in effort to discuss options for mobility  THN CM Short Term Goal #2   Member's daughter will discuss physical therapy with MD within the next 3 weeks and develop plan of care to increase mobility  THN CM Short Term Goal #2 Start Date  08/06/17  Interventions for Short Term Goal #2  Will discuss member's concern regarding mobility with daughter.  Educated on possiblity of physical therapy having positive impact on mobility.     [name], [place], MSN [place] [phone]

## 2017-08-07 ENCOUNTER — Encounter: Payer: Self-pay | Admitting: *Deleted

## 2017-08-07 ENCOUNTER — Other Ambulatory Visit: Payer: Self-pay | Admitting: *Deleted

## 2017-08-07 NOTE — Patient Outreach (Signed)
[place] [facility]) Care Management  08/07/2017  MARVELENE STONEBERG 12-02-1940 [id number]   CSW was able to make initial contact with patient today to perform phone assessment, as well as assess and assist with social work needs and services.  CSW introduced self, explained role and types of services provided through [place] [facility] ([place] [facility]).  CSW further explained to patient that CSW works with patient's RNCM, also with [place] [facility], [name]. CSW then explained the reason for the call, indicating that Mrs. Orene Desanctis thought that patient would benefit from social work services and resources to assist with arranging additional hours of home care services, as well as assisting patient with completion of an Adult Medicaid application.  CSW obtained two HIPAA compliant identifiers from patient, which included patient's name and date of birth. Patient reported that she is in the process of getting an aide to assist her from 12-3:00, Monday through Friday, through [facility], increasing her hours from 8 to 15, per week.  Patient and daughter, [name] have already placed the request, they are just awaiting an aide to become available.  CSW requested to speak with [name]; however, she was unavailable at the time of CSW's call.  CSW left a HIPAA compliant message for [name] with patient, encouraging her to have [name] return CSW's call at her earliest convenience.  Patient currently lives at home alone, but [name] is patient's primary caregiver and will stay in the home with patient, from time-to-time.   Patient is now wanting to reapply for Adult Medicaid through the [place], indicating that she has applied in the past, but was denied due to her income exceeding the Poverty Guidelines for the [place] for fiscal year 2017/2018.  Patient believes that she will now be eligible,  as her income has changed and she has accrued a great deal of medical expenses since last applying.  CSW agreed to mail an Adult Medicaid application to patient's home, as patient reported that she and [name] are able to successfully complete and submit to the Department of Social Services for processing.   After CSW explained PACE ([facility] [place] [facility] Elderly) in great detail, patient denied having an interest in enrolling in services.  CSW then spoke with patient about ACE ([facility]), but again, patient was not interested in having CSW make a referral for services.  Patient denied having any additional social work needs at present.  CSW agreed to mail patient the Adult Medicaid application, while awaiting a return call from [name].  CSW will outreach to [name] within the next week, if a return call is not received in the meantime. [name], BSW, MSW, LCSW  Licensed Education officer, environmental Health System  Mailing [employer] [address], [place], [place] [address] Physical Address-[address], [place], [place] [address] Toll Free Main # [phone] Fax # [phone] Cell # [phone]  Office # [phone] [email or web address]

## 2017-08-07 NOTE — Patient Outreach (Signed)
Request received from Joanna Saporito, LCSW to mail patient personal care resources.  Information mailed today. 

## 2017-08-08 ENCOUNTER — Encounter: Payer: Self-pay | Admitting: *Deleted

## 2017-08-13 ENCOUNTER — Other Ambulatory Visit: Payer: Self-pay | Admitting: *Deleted

## 2017-08-13 NOTE — Patient Outreach (Signed)
[facility]) Care Management  08/13/2017  [name] 16-Mar-1941 [id number]   CSW received a return call from patient's daughter, [name], in regards to the HIPAA compliant message left on voicemail for her during the previous week.  However, CSW was unavailable at the time of [name]'s call.  [name] left a message for CSW on voicemail.   CSW made an attempt to try and contact [name] today; however, she was not available at the time of CSW's call.  A second HIPAA compliant message was left on voicemail and CSW is currently awaiting a return call.  CSW is scheduled to outreach to patient again on Tuesday, October 16th, at which time, CSW will also try and make contact with [name]. [name], BSW, MSW, LCSW  Licensed Education officer, environmental Health System  Mailing [employer] [address] [place], [place] [address] Physical Address-[address], [place], [place] [address] Toll Free Main # [phone] Fax # [phone] Cell # [phone]  Office # [phone] [email or web address]

## 2017-08-14 ENCOUNTER — Ambulatory Visit: Payer: Self-pay

## 2017-08-14 ENCOUNTER — Other Ambulatory Visit: Payer: Self-pay | Admitting: *Deleted

## 2017-08-14 DIAGNOSIS — H25011 Cortical age-related cataract, right eye: Secondary | ICD-10-CM | POA: Diagnosis not present

## 2017-08-14 DIAGNOSIS — H02839 Dermatochalasis of unspecified eye, unspecified eyelid: Secondary | ICD-10-CM | POA: Diagnosis not present

## 2017-08-14 DIAGNOSIS — H2511 Age-related nuclear cataract, right eye: Secondary | ICD-10-CM | POA: Diagnosis not present

## 2017-08-14 DIAGNOSIS — H25041 Posterior subcapsular polar age-related cataract, right eye: Secondary | ICD-10-CM | POA: Diagnosis not present

## 2017-08-14 NOTE — Patient Outreach (Signed)
[facility]) Care Management  08/14/2017  LAFERN BRINKLEY 26-Nov-1940 [id number]   CSW made an attempt to try and contact patient today to follow-up regarding social work services and resources, as well as to ensure that patient received the packet of resources mailed to her home by CSW; however, patient was unavailable at the time of CSW's call.  CSW also made an attempt to try and contact patient's daughter, [name], per her initial request, but she too, was unavailable.  A HIPAA compliant message was left for [name] on [employer].  CSW is currently awaiting return calls from patient and [name].  CSW will make a second outreach attempt within the next week, if a return call is not received from patient and/or [name] in the meantime. [name], BSW, MSW, LCSW  Licensed Education officer, environmental Health System  Mailing [place] [address], [place], [place] [address] Physical Address-[address], [place], [place] [address] Toll Free Main # [phone] Fax # [phone] Cell # [phone]  Office # [phone] [email or web address]

## 2017-08-20 ENCOUNTER — Ambulatory Visit: Payer: PPO

## 2017-08-20 ENCOUNTER — Ambulatory Visit (INDEPENDENT_AMBULATORY_CARE_PROVIDER_SITE_OTHER): Payer: PPO | Admitting: Family Medicine

## 2017-08-20 ENCOUNTER — Encounter: Payer: Self-pay | Admitting: Family Medicine

## 2017-08-20 VITALS — BP 142/72 | HR 77 | Ht 62.0 in | Wt 215.8 lb

## 2017-08-20 DIAGNOSIS — I10 Essential (primary) hypertension: Secondary | ICD-10-CM

## 2017-08-20 DIAGNOSIS — R5381 Other malaise: Secondary | ICD-10-CM | POA: Diagnosis not present

## 2017-08-20 DIAGNOSIS — R600 Localized edema: Secondary | ICD-10-CM

## 2017-08-20 NOTE — Patient Instructions (Signed)
No changes today.  Come back in about 3 months.  Your blood pressure should be 150/90 or lower.  Take care,  Dr [name]

## 2017-08-20 NOTE — Progress Notes (Signed)
    Subjective:  [name] is a 76 y.o. female who presents today with a chief complaint of lower extremity edema.   HPI:  LE edema, chronic problem, improving Her last office visit she was started on HCTZ and discontinued on amlodipine.  We also discussed conservative measures including compression stockings and feet elevation.  Since her last visit her symptoms have improved significantly.  Hypertension, Chronic Problem, Stable BP Readings from Last 3 Encounters:  08/20/17 (!) 142/72  08/06/17 133/85  07/31/17 122/64   Current Medications: Losartan 100 mg daily, HCTZ 12.5 mg daily, compliant without side effects. Interim History: See above problem.  3 weeks ago patient was started on HCTZ and stopped amlodipine.  ROS: Denies any chest pain, shortness of breath, dyspnea on exertion, leg edema.   ROS: Per HPI  PMH: Smoking history reviewed. Former smoker.   Objective:  Physical Exam: BP (!) 142/72 (BP Location: Left Arm, Patient Position: Sitting, Cuff Size: Normal)   Pulse 77   Ht 5\' 2"  (1.575 m)   Wt 215 lb 12.8 oz (97.9 kg)   SpO2 95%   BMI 39.47 kg/m   Gen: NAD, resting comfortably CV: RRR with no murmurs appreciated Pulm: NWOB, CTAB with no crackles, wheezes, or rhonchi GI: Normal bowel sounds present. Soft, Nontender, Nondistended. MSK: 1+ pretibial edema bilaterally.  Assessment/Plan:  Essential hypertension Continue HCTZ and losartan.  Follow-up in 3 months.  Lower extremity edema Improving.  Continue HCTZ.  Continue with conservative measures.  Referral to PT pending.   Algis Greenhouse. [name], MD 08/21/2017 8:27 AM

## 2017-08-21 ENCOUNTER — Other Ambulatory Visit: Payer: Self-pay | Admitting: *Deleted

## 2017-08-21 NOTE — Patient Outreach (Addendum)
[facility]) Care Management  08/21/2017  LAQUILLA DAULT 1941-09-12 [id number]   Call placed to member's daughter to follow up on member's current health status as well as conversation regarding increasing care in the home versus PACE program.  Member was adamant about not going to visit during last conversation, however daughter reported she would follow up with member regarding consideration.  No answer.  HIPAA compliant voice message left, will await call back.  If no callback, will follow up within the next week.    Update @ 3pm:  Call received back from daughter.  She report the member is "the same."  She remain concerned about member's ability to stay in her home and care for herself, however, member remains adamant about not going into a facility.  She continue to refuse to consider PACE.  Daughter state she has been working with the [employer] DSS worker to apply for Medicaid.  She state she has written down several questions to as regarding pending approval, specifically about increasing hours of coverage for assistance in the home and food stamps.    She continue to assist member with medications, but is unsure how compliant she is.  Home aide reminds member to take medications when she is in the home.  Daughter report she has been attempting to get in contact with LCSW, J. Saporito, but has been "missing her" due to her work schedule.    Will follow up with LCSW to collaborate on plan of care. Will follow up with routine home visit next month.  [name], [place], MSN [place] [phone]

## 2017-08-21 NOTE — Assessment & Plan Note (Signed)
Continue HCTZ and losartan.  Follow-up in 3 months.

## 2017-08-21 NOTE — Assessment & Plan Note (Signed)
Improving.  Continue HCTZ.  Continue with conservative measures.  Referral to PT pending.

## 2017-08-22 ENCOUNTER — Other Ambulatory Visit: Payer: Self-pay | Admitting: *Deleted

## 2017-08-22 NOTE — Patient Outreach (Signed)
[facility] [facility]) Care Management  08/22/2017  [name] 09-Nov-1940 [id number]   CSW made a second attempt to try and contact patient today to follow-up regarding social work services and resources, as well as to ensure that patient received the packet of resources mailed to her home by CSW; however, patient was unavailable at the time of CSW's call.  CSW also made an attempt to try and contact patient's daughter, [name], but she too, was unavailable.  A HIPAA compliant message was left for [name] on [employer].  CSW is currently awaiting return calls from patient and [name]. CSW will make a third and final outreach attempt within the next week, if a return call is not received from patient and/or [name] in the meantime. [name], BSW, MSW, LCSW  Licensed Education officer, environmental Health System  Mailing [place] [address], [place], [place] [address] Physical Address-[address], [place], [place] [address] Toll Free Main # [phone] Fax # [phone] Cell # [phone]  Office # [phone] [email or web address]

## 2017-08-28 ENCOUNTER — Other Ambulatory Visit: Payer: Self-pay | Admitting: *Deleted

## 2017-08-28 ENCOUNTER — Encounter: Payer: Self-pay | Admitting: *Deleted

## 2017-08-28 NOTE — Patient Outreach (Signed)
[facility]) Care Management  08/28/2017  [name] 07/18/1941 [id number]   CSW made a third and final attempt to try and contact patient and patient's daughter, [name] today to perform phone assessment, as well as assess and assist with social work needs and services, without success.  A HIPAA compliant message was left for patient on voicemail.  CSW  continues to await a return call.  CSW will mail an outreach letter to patient's home, encouraging patient to contact CSW at their earliest convenience, if patient is interested in receiving social work services through [place] with [facility].  If CSW does not receive a return call from patient within the next 10 business days, CSW will proceed with case closure.  Required number of phone attempts will have been made and outreach letter mailed.   [name], BSW, MSW, LCSW  Licensed Education officer, environmental Health System  Mailing [place] [address] [place], [place] [address] Physical Address-[address], [place], [place] [address] Toll Free Main # [phone] Fax # [phone] Cell # [phone]  Office # [phone] [email or web address]

## 2017-08-29 ENCOUNTER — Ambulatory Visit: Payer: Self-pay | Admitting: *Deleted

## 2017-08-30 ENCOUNTER — Encounter (HOSPITAL_COMMUNITY): Payer: Self-pay | Admitting: Emergency Medicine

## 2017-08-30 ENCOUNTER — Telehealth: Payer: Self-pay | Admitting: Family Medicine

## 2017-08-30 DIAGNOSIS — Z5321 Procedure and treatment not carried out due to patient leaving prior to being seen by health care provider: Secondary | ICD-10-CM | POA: Diagnosis not present

## 2017-08-30 DIAGNOSIS — R197 Diarrhea, unspecified: Secondary | ICD-10-CM | POA: Diagnosis present

## 2017-08-30 LAB — URINALYSIS, ROUTINE W REFLEX MICROSCOPIC
Bilirubin Urine: NEGATIVE
Glucose, UA: NEGATIVE mg/dL
Hgb urine dipstick: NEGATIVE
Ketones, ur: NEGATIVE mg/dL
Leukocytes, UA: NEGATIVE
Nitrite: NEGATIVE
Protein, ur: NEGATIVE mg/dL
Specific Gravity, Urine: 1.013 (ref 1.005–1.030)
pH: 5 (ref 5.0–8.0)

## 2017-08-30 LAB — CBC
HCT: 37.5 % (ref 36.0–46.0)
Hemoglobin: 12.2 g/dL (ref 12.0–15.0)
MCH: 31 pg (ref 26.0–34.0)
MCHC: 32.5 g/dL (ref 30.0–36.0)
MCV: 95.4 fL (ref 78.0–100.0)
Platelets: 218 10*3/uL (ref 150–400)
RBC: 3.93 MIL/uL (ref 3.87–5.11)
RDW: 15.9 % — ABNORMAL HIGH (ref 11.5–15.5)
WBC: 8.1 10*3/uL (ref 4.0–10.5)

## 2017-08-30 LAB — COMPREHENSIVE METABOLIC PANEL
ALT: 27 U/L (ref 14–54)
AST: 27 U/L (ref 15–41)
Albumin: 3.9 g/dL (ref 3.5–5.0)
Alkaline Phosphatase: 125 U/L (ref 38–126)
Anion gap: 8 (ref 5–15)
BUN: 15 mg/dL (ref 6–20)
CO2: 22 mmol/L (ref 22–32)
Calcium: 9.6 mg/dL (ref 8.9–10.3)
Chloride: 110 mmol/L (ref 101–111)
Creatinine, Ser: 1.18 mg/dL — ABNORMAL HIGH (ref 0.44–1.00)
GFR calc Af Amer: 51 mL/min — ABNORMAL LOW (ref >60)
GFR calc non Af Amer: 44 mL/min — ABNORMAL LOW (ref >60)
Glucose, Bld: 90 mg/dL (ref 65–99)
Potassium: 4 mmol/L (ref 3.5–5.1)
Sodium: 140 mmol/L (ref 135–145)
Total Bilirubin: 0.8 mg/dL (ref 0.3–1.2)
Total Protein: 7.1 g/dL (ref 6.5–8.1)

## 2017-08-30 LAB — LIPASE, BLOOD: Lipase: 29 U/L (ref 11–51)

## 2017-08-30 NOTE — Telephone Encounter (Signed)
Patient's daughter called back stating she got sent to a "receptionist who was clueless" and stated that triage stated "there were no nurses in the area." Attempted to send patient to triage again.

## 2017-08-30 NOTE — Telephone Encounter (Signed)
Spoke with pts daughter. States she has had diarrhea since 08/21/17, emesis since 08/27/17 and started experiencing diarrhea from her vagina today. I suggested they go to the ED today. Daughter agrees and states they will go to [facility].

## 2017-08-30 NOTE — Telephone Encounter (Signed)
Returning missed call from [place].  Ty,  -LL

## 2017-08-30 NOTE — Telephone Encounter (Signed)
Patient's daughter called in reference to patient having a lot of "stomach issues". Patient fell on 08/19/17 and has been having "stomach issues" since 08/22/17. Patient started "vomiting" 08/26/17. Patient has been having "diarrhea from vagina area" starting today 08/30/17. Patient's daughter stated she realized these symptoms were "strange" and was considering taking patient to ER. Sent patient's daughter to triage. Awaiting note from triage.

## 2017-08-30 NOTE — Telephone Encounter (Addendum)
Left VM on cell phone# to daughter provided in chart.

## 2017-08-30 NOTE — Telephone Encounter (Signed)
Left VM requesting call back

## 2017-08-30 NOTE — ED Notes (Signed)
Per daughter pt has had diarrhea X2 weeks. States "it is coming out the back and the front" .Marland KitchenMarland Kitchen

## 2017-08-31 ENCOUNTER — Emergency Department (HOSPITAL_COMMUNITY)
Admission: EM | Admit: 2017-08-31 | Discharge: 2017-08-31 | Disposition: A | Discharge status: Home / Self Care | Payer: PPO | Attending: Emergency Medicine | Admitting: Emergency Medicine

## 2017-08-31 NOTE — ED Notes (Signed)
Pt called twice, no answer

## 2017-08-31 NOTE — Telephone Encounter (Signed)
FYI

## 2017-08-31 NOTE — Telephone Encounter (Signed)
Patient called stating she went to ED last night 08/30/17 and was there from 6:30pm-11:30pm without being seen so patient went home. Patient stated when her BP was checked at ED it was "very high and over 175". Patient stated she had been sweating all night and is continuing to sweat today. Patient also stated she has not slept in 2 days. Please call patient and advise.

## 2017-09-04 ENCOUNTER — Ambulatory Visit (INDEPENDENT_AMBULATORY_CARE_PROVIDER_SITE_OTHER): Payer: PPO | Admitting: Psychiatry

## 2017-09-04 ENCOUNTER — Encounter (HOSPITAL_COMMUNITY): Payer: Self-pay | Admitting: Psychiatry

## 2017-09-04 DIAGNOSIS — F200 Paranoid schizophrenia: Secondary | ICD-10-CM | POA: Diagnosis not present

## 2017-09-04 DIAGNOSIS — F419 Anxiety disorder, unspecified: Secondary | ICD-10-CM

## 2017-09-04 DIAGNOSIS — Z87891 Personal history of nicotine dependence: Secondary | ICD-10-CM | POA: Diagnosis not present

## 2017-09-04 DIAGNOSIS — Z818 Family history of other mental and behavioral disorders: Secondary | ICD-10-CM | POA: Diagnosis not present

## 2017-09-04 MED ORDER — HYDROXYZINE PAMOATE 25 MG PO CAPS: 25.0000 mg | ORAL_CAPSULE | Freq: Every day | ORAL | 2 refills | Status: DC

## 2017-09-04 MED ORDER — BUPROPION HCL ER (XL) 300 MG PO TB24: 300.0000 mg | ORAL_TABLET | Freq: Every morning | ORAL | 2 refills | Status: DC
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
## 2017-09-11 ENCOUNTER — Encounter: Payer: Self-pay | Admitting: *Deleted

## 2017-09-11 ENCOUNTER — Other Ambulatory Visit: Payer: Self-pay | Admitting: *Deleted

## 2017-09-11 ENCOUNTER — Ambulatory Visit: Payer: Self-pay | Admitting: *Deleted

## 2017-09-11 NOTE — Patient Outreach (Signed)
[facility]) Care Management  09/11/2017  [name] Nov 23, 1940 [id number]   CSW will perform a case closure on patient, due to inability to maintain phone contact, despite required number of phone attempts made and outreach letter mailed to patient's home allowing 10 business days for a response.  CSW will notify patient's RNCM with [place] Management, [name] of CSW's plans to close patient's case.  CSW will fax an update to patient's Primary Care Physician, Dr. Dimas Chyle to ensure that they are aware of CSW's involvement with patient's plan of care.  CSW will submit a case closure request to Alycia Rossetti, Care Management Assistant with [place] [facility], in the form of an [employer].  CSW will ensure that [name] is aware of [name]'s continued involvement with patient's care. [name], BSW, MSW, LCSW  Licensed Education officer, [facility]  Mailing [place] [address] [place], [place] [address] Physical Address-[address], [place], [place] [address] Toll Free Main # [phone] Fax # [phone] Cell # [phone]  Office # [phone] [email or web address]

## 2017-09-11 NOTE — Patient Outreach (Signed)
[facility]) Care Management  09/11/2017  GRANT HENKES August 06, 1941 [id number]   Call placed to follow up on current health status and to schedule home visit, no answer.  HIPAA compliant voice message left.  Will await call back, if no call back, will follow up next week.  [name], MSN, RN [facility] Care Management  [facility] Manager [phone]

## 2017-09-13 ENCOUNTER — Other Ambulatory Visit: Payer: Self-pay | Admitting: *Deleted

## 2017-09-13 NOTE — Patient Outreach (Signed)
[facility] [facility]) Care Management  09/13/2017  [name] 1941-03-19 [id number]   Notified that member called 24 hour nurse triage line with complaints of abdominal pain and diarrhea.  Of note, she was advised to go to the ED.  Noted per chart that she has not arrived at ED as of yet.  Call placed to daughter [name] as her number is listed as member's primary contact, no answer.  HIPAA compliant voice message left.  Will await call back, if no call back, will follow up next week.  [name], MSN, RN [facility] Care Management  [facility] Manager [phone]

## 2017-09-17 ENCOUNTER — Other Ambulatory Visit: Payer: Self-pay | Admitting: *Deleted

## 2017-09-17 ENCOUNTER — Encounter: Payer: Self-pay | Admitting: *Deleted

## 2017-09-17 NOTE — Patient Outreach (Signed)
[facility]) Care Management  09/17/2017  INGRID SHIFRIN 04/12/1941 [id number]   Call placed to member's daughter, [name], to follow up on current health status and to attempt to schedule home visit. No answer, HIPAA compliant voice message left.  This makes 3rd unsuccessful attempt. Will send outreach letter, if no response in 10 days, will close case.  [name], [place], MSN [place] [phone]

## 2017-09-27 ENCOUNTER — Other Ambulatory Visit: Payer: Self-pay | Admitting: Internal Medicine

## 2017-09-27 ENCOUNTER — Ambulatory Visit: Payer: Self-pay | Admitting: Internal Medicine

## 2017-09-27 DIAGNOSIS — M545 Low back pain, unspecified: Secondary | ICD-10-CM

## 2017-09-27 NOTE — Addendum Note (Signed)
Addended by: Orson Gear on: 09/27/2017 04:52 PM   Modules accepted: Orders

## 2017-09-30 IMAGING — CR DG CHEST 2V
2 series · 2 of 2 positions shown · non-contrast
Comparison: 01/25/2016 and prior chest radiographs

CLINICAL DATA: Cough for 1 month with fever.

EXAM:
CHEST  2 VIEW

[chest lat]
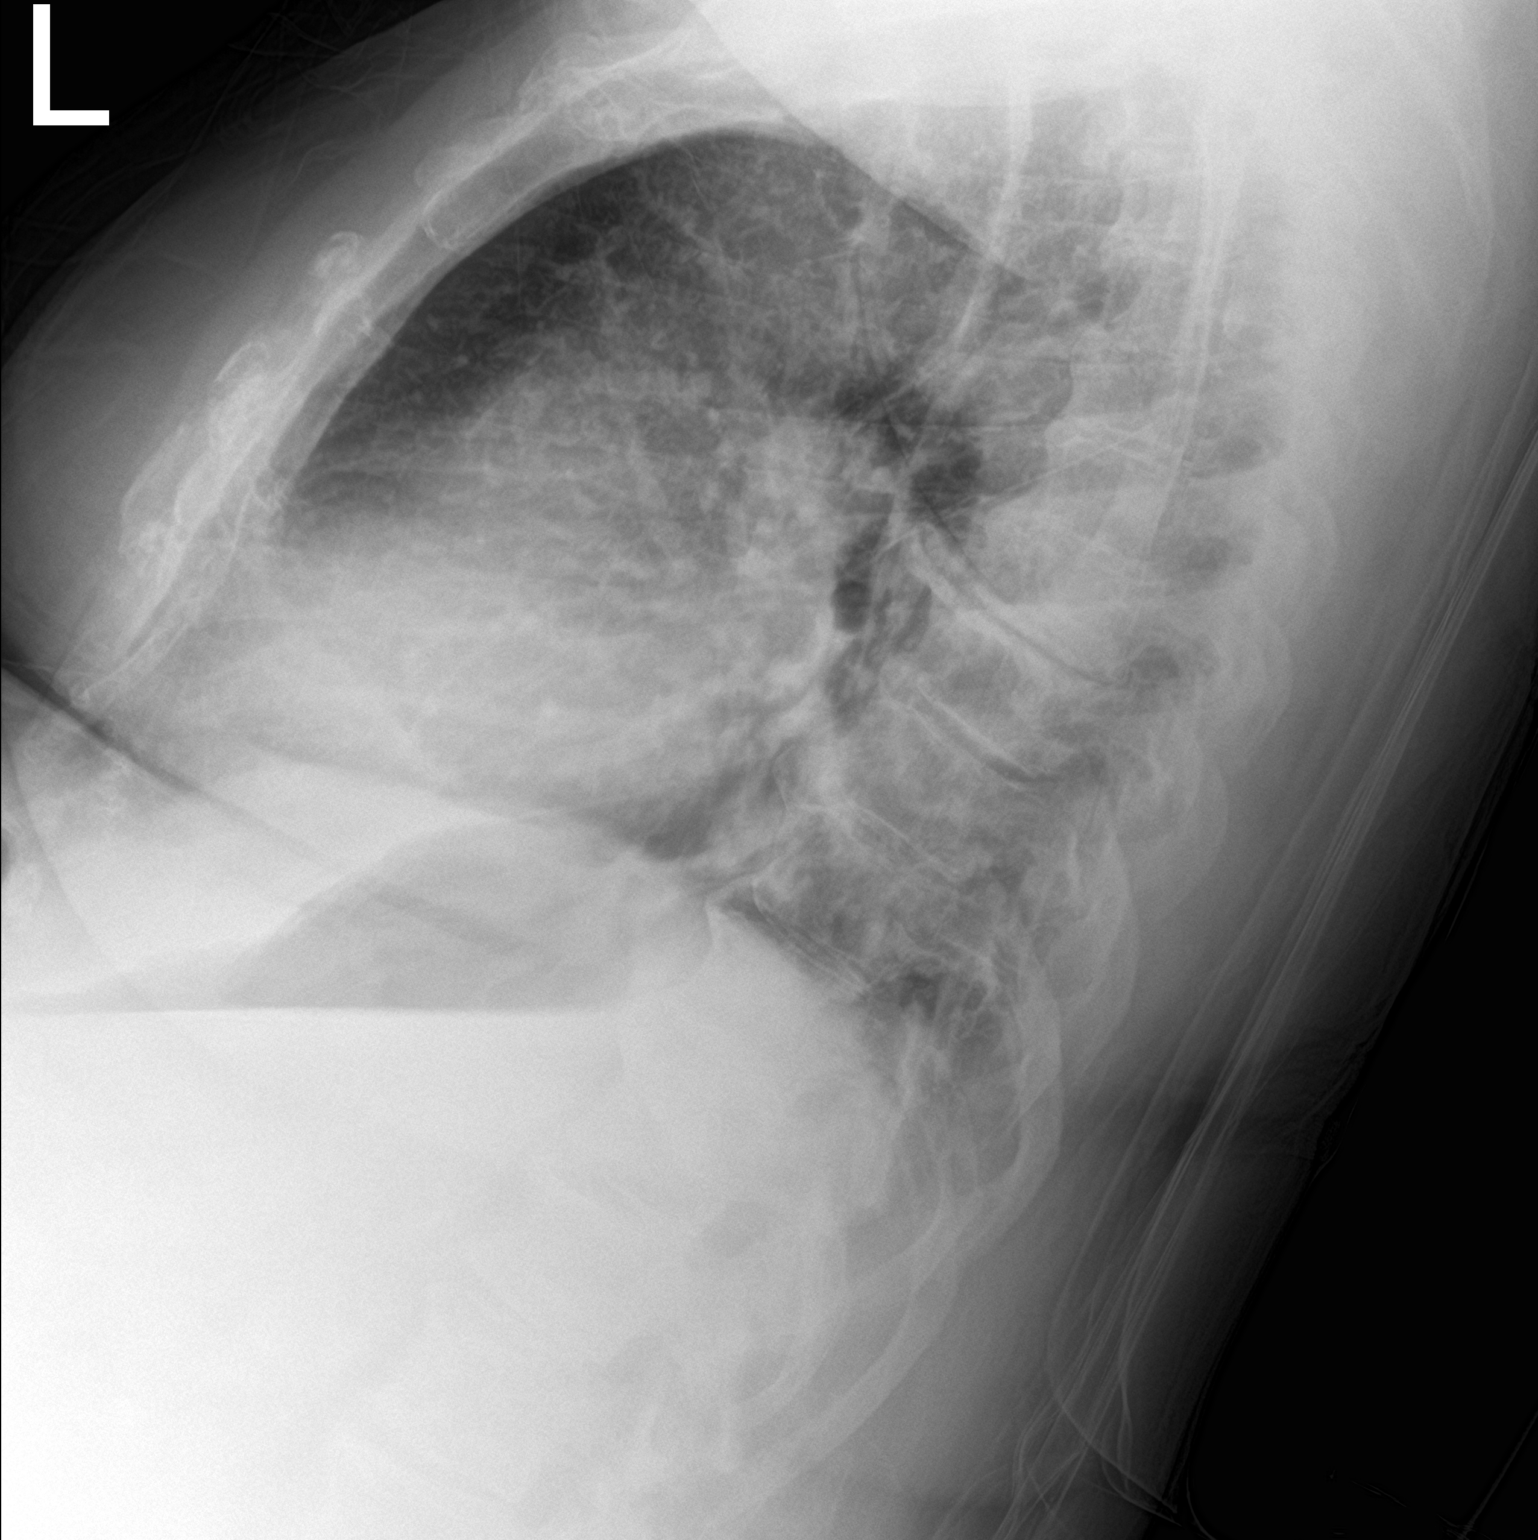

[chest ap]
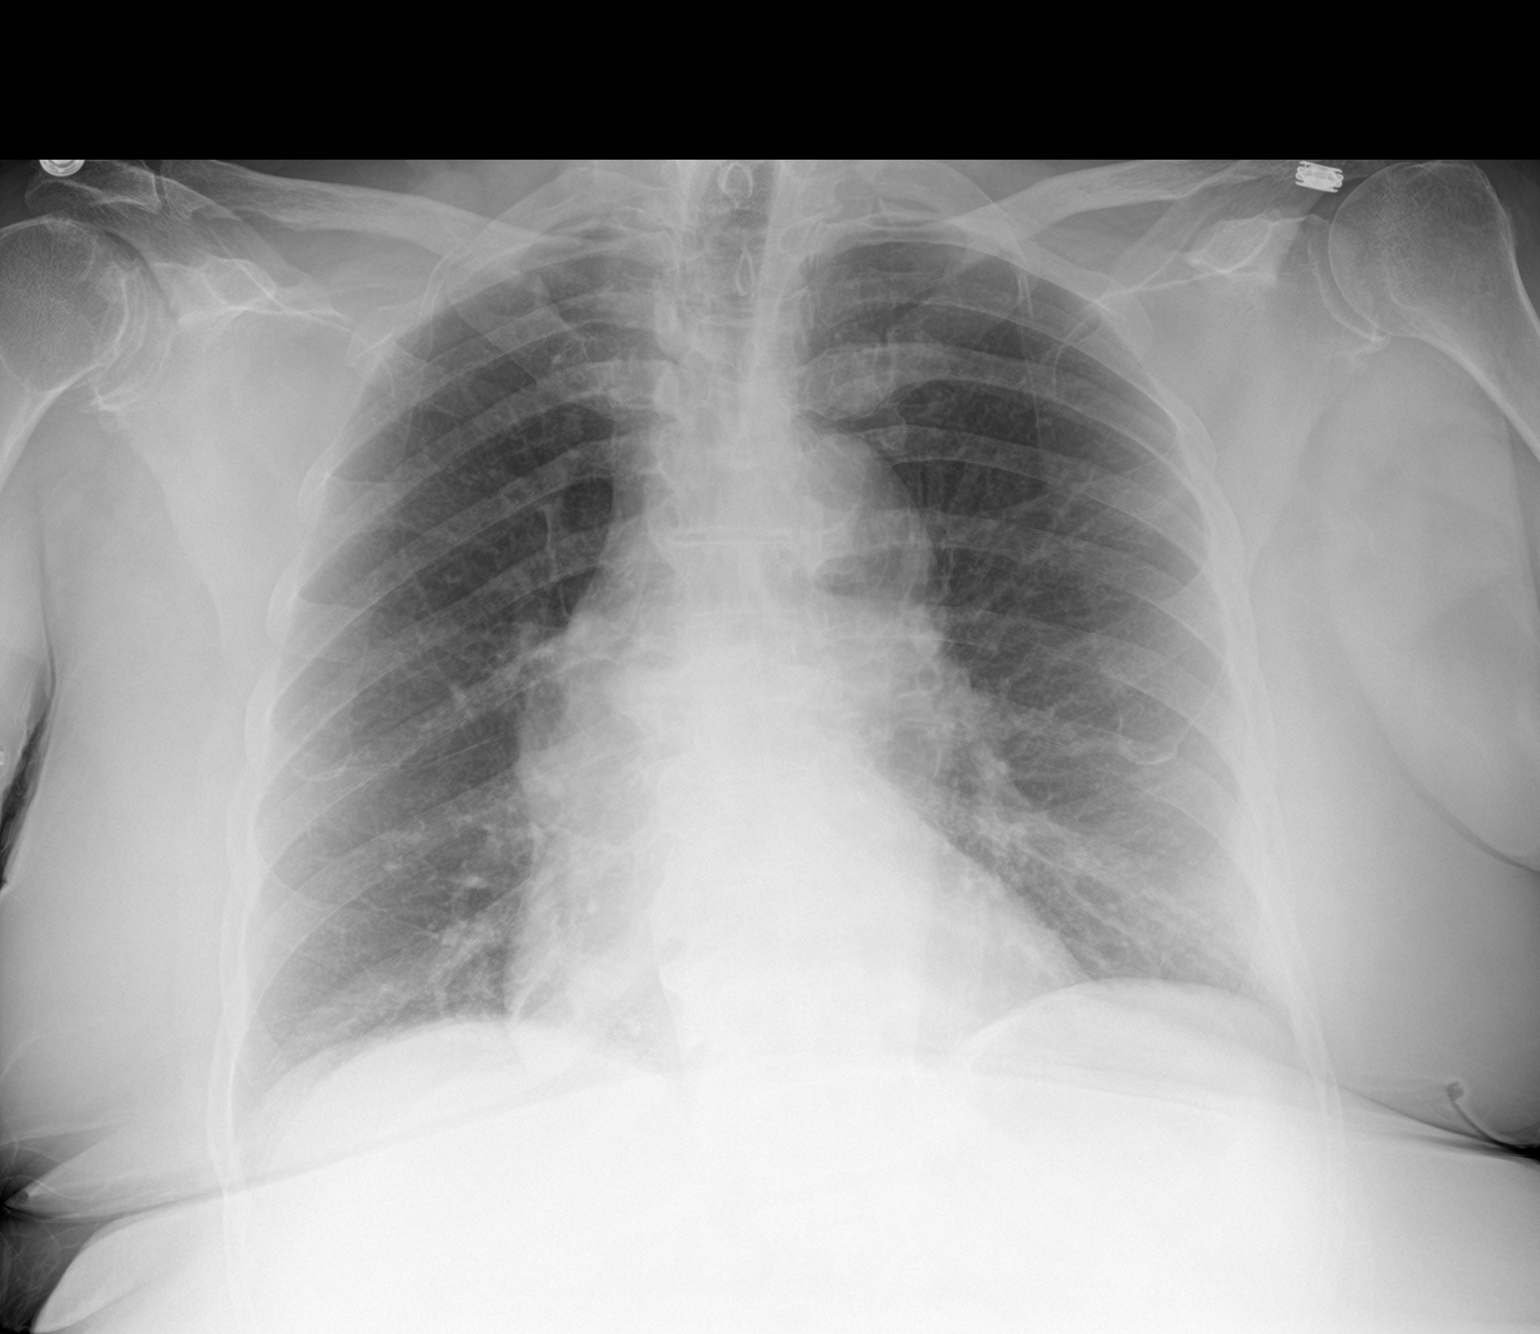

[2 of 2 positions shown; findings below may reference images not displayed]

FINDINGS: The cardiomediastinal silhouette is unchanged with tortuous/ectatic
thoracic aorta.

Mild peribronchial thickening is unchanged.

There is no evidence of focal airspace disease, pulmonary edema,
suspicious pulmonary nodule/mass, pleural effusion, or pneumothorax.
No acute bony abnormalities are identified.
IMPRESSION: No active cardiopulmonary disease.

## 2017-10-01 ENCOUNTER — Other Ambulatory Visit: Payer: Self-pay | Admitting: Family Medicine

## 2017-10-01 DIAGNOSIS — M545 Low back pain, unspecified: Secondary | ICD-10-CM

## 2017-10-02 ENCOUNTER — Other Ambulatory Visit: Payer: Self-pay | Admitting: *Deleted

## 2017-10-02 NOTE — Patient Outreach (Signed)
[name]  10/02/2017  [name] [birth year] [id number]   Unsuccessful outreach letter sent on 11/19, no response.  Will close case.  Will notify primary MD and care management assistant.  [name], MSN, RN [facility] Care Management  [facility] Manager [phone]

## 2017-10-03 NOTE — Telephone Encounter (Signed)
Last OV 07-31-2017 Pending appt 11-20-2017 Last refill 06-07-2017 #60, 2rf Okay to sent in medication?

## 2017-10-03 NOTE — Telephone Encounter (Signed)
Medication sent in for patient. 

## 2017-10-03 NOTE — Telephone Encounter (Signed)
Ok with me. Please place any necessary orders. 

## 2017-10-16 ENCOUNTER — Ambulatory Visit: Payer: PPO | Admitting: Neurology

## 2017-10-16 ENCOUNTER — Ambulatory Visit: Payer: PPO | Admitting: Adult Health

## 2017-10-24 ENCOUNTER — Encounter: Payer: Self-pay | Admitting: Neurology

## 2017-10-24 ENCOUNTER — Ambulatory Visit (INDEPENDENT_AMBULATORY_CARE_PROVIDER_SITE_OTHER): Payer: PPO | Admitting: Neurology

## 2017-10-24 VITALS — BP 183/97 | HR 78 | Ht 62.0 in | Wt 217.0 lb

## 2017-10-24 DIAGNOSIS — R413 Other amnesia: Secondary | ICD-10-CM | POA: Diagnosis not present

## 2017-10-24 MED ORDER — MEMANTINE HCL 5 MG PO TABS: ORAL_TABLET | ORAL | 0 refills | Status: DC

## 2017-10-24 NOTE — Patient Instructions (Signed)
   Stop the donepezil (Aricept).  We will start Namenda. Call in one month for a prescription if this is tolerated.  Look out for dizziness.

## 2017-10-24 NOTE — Progress Notes (Signed)
Reason for visit: Memory disturbance  [name] is an 76 y.o. female  History of present illness:  [name] is a 76 year old right-handed black female with a history of a mild memory disturbance.  The patient has a chronic issue with depression, she is followed through psychiatry.  She continues to indicate that she is having a lot of problems with depression, she feels isolated and withdrawn.  She is on Wellbutrin but this does not help.  The patient has been on Aricept, she has had some problems recently with significant diarrhea and stomach cramps.  The patient otherwise is tolerating the medication fairly well.  The patient indicates that she sleeps fairly well.  The patient returns to this office for further evaluation.  The patient reports a lot of discomfort in the hips with walking, she uses a walker for ambulation.  Past Medical History:  Diagnosis Date  . Chronic venous insufficiency   . Colon, diverticulosis Sept. 2011   outpatient colonoscopy by Dr. Cristina Gong.  Need record  . DDD (degenerative disc disease), lumbosacral   . Depression   . DJD (degenerative joint disease), multiple sites    Low back pain worst  . GERD (gastroesophageal reflux disease)   . Gout   . Gout    Uric Acid level 4.2 on 300 allopurinol  . History of cervical cancer 1972  . Hx-TIA (transient ischemic attack) 05/13/2001   Right facial numbness  . Hyperlipidemia   . Hypertension   . Memory disorder 12/20/2016  . Mitral valve prolapse 10/27/1999   Subsequent 2D echo show normal mitral valve  . Osteoarthritis of hip    bilateral hips  . Ovarian cancer ([place]) 1972   S/P oophorectomy  . Psychosis ([place])   . Recurrent boils    History of MRSA skin infections with abscess  . Sensorineural hearing loss of both ears   . Subdural hematoma ([place]) 01/26/2017   bilateral    Past Surgical History:  Procedure Laterality Date  . ABDOMINAL HYSTERECTOMY     1972  . BURR HOLE Bilateral 02/12/2017   Procedure: Haskell Flirt;  Surgeon: Earnie Larsson, MD;  Location: [place];  Service: Neurosurgery;  Laterality: Bilateral;  . CERVICAL DISCECTOMY  7/07   C5-C6  . JOINT REPLACEMENT     bilateral hip replacement  . LUMBAR DISC SURGERY     L5-S1 7/07  . LUMBAR LAMINECTOMY/DECOMPRESSION MICRODISCECTOMY N/A 02/02/2016   Procedure: L4-5 Decompression;  Surgeon: Marybelle Killings, MD;  Location: [place];  Service: Orthopedics;  Laterality: N/A;  . [place] for ovarian cancer    Family History  Problem Relation Age of Onset  . Diabetes Mother   . Heart disease Mother   . Hearing loss Mother   . Stroke Mother   . Post-traumatic stress disorder Father   . Schizophrenia Maternal Grandmother   . Diabetes Maternal Grandfather   . Other Brother        brother died in mental health hospital  . Kidney cancer Neg Hx   . Bladder Cancer Neg Hx     Social history:  reports that she quit smoking about 4 years ago. Her smoking use included cigarettes. She smoked 0.10 packs per day. she has never used smokeless tobacco. She reports that she does not drink alcohol or use drugs.    Allergies  Allergen Reactions  . Aspirin Other (See Comments)    Bleeding from vagina   . Sulfamethoxazole-Trimethoprim Itching  Medications:  Prior to Admission medications   Medication Sig Start Date End Date Taking? Authorizing Provider  acetaminophen (TYLENOL) 500 MG tablet Take 500 mg by mouth every 8 (eight) hours as needed.   Yes [provider]  allopurinol (ZYLOPRIM) 300 MG tablet TAKE 1 TABLET BY MOUTH DAILY 04/18/17  Yes Bartholomew Crews, MD  ascorbic acid (VITAMIN C) 500 MG tablet Take 500 mg by mouth daily.   Yes [provider]  atorvastatin (LIPITOR) 40 MG tablet Take 1 tablet (40 mg total) by mouth daily. 06/07/17  Yes Bartholomew Crews, MD  buPROPion (WELLBUTRIN XL) 300 MG 24 hr tablet Take 1 tablet (300 mg total) every morning by mouth. 09/04/17  Yes Arfeen, Arlyce Harman, MD  donepezil  (ARICEPT) 5 MG tablet Take 1 tablet (5 mg total) by mouth at bedtime. 06/20/17  Yes Ward Givens, NP  fluticasone (FLONASE) 50 MCG/ACT nasal spray INSTILL 2 SPRAYS IN EACH NOSTRIL DAILY 02/28/17  Yes Bartholomew Crews, MD  hydrochlorothiazide (HYDRODIURIL) 12.5 MG tablet Take 1 tablet (12.5 mg total) by mouth daily. 08/02/17  Yes Vivi Barrack, MD  hydrOXYzine (VISTARIL) 25 MG capsule Take 1 capsule (25 mg total) at bedtime by mouth. 09/04/17  Yes Arfeen, Arlyce Harman, MD  losartan (COZAAR) 100 MG tablet Take 1 tablet (100 mg total) by mouth daily. 03/16/17  Yes Angiulli, Lavon Paganini, PA-C  Multiple Vitamins-Minerals (ALIVE WOMENS ENERGY) TABS Take 1 tablet by mouth daily.    Yes [provider]  pantoprazole (PROTONIX) 40 MG tablet TAKE 1 TABLET BY MOUTH TWICE DAILY BEFORE A MEAL 04/18/17  Yes Bartholomew Crews, MD  potassium chloride (MICRO-K) 10 MEQ CR capsule TAKE 1 CAPSULE BY MOUTH DAILY 04/18/17  Yes Bartholomew Crews, MD  traMADol (ULTRAM) 50 MG tablet TAKE 1 TABLET BY MOUTH TWICE DAILY AS NEEDED 10/03/17  Yes Vivi Barrack, MD    ROS:  Out of a complete 14 system review of symptoms, the patient complains only of the following symptoms, and all other reviewed systems are negative.  Excessive sweating Hearing loss Double vision, blurred vision Cough Leg swelling Abdominal pain, diarrhea Joint pain Memory loss Confusion, decreased concentration, depression, anxiety, hyperactivity  Blood pressure (!) 183/97, pulse 78, height 5\' 2"  (1.575 m), weight 217 lb (98.4 kg).  Physical Exam  General: The patient is alert and cooperative at the time of the examination.  The patient is moderately to markedly obese.  Skin: No significant peripheral edema is noted.   Neurologic Exam  Mental status: The patient is alert and oriented x 3 at the time of the examination. The patient has apparent normal recent and remote memory, with an apparently normal attention span and concentration  ability.   Cranial nerves: Facial symmetry is present. Speech is normal, no aphasia or dysarthria is noted. Extraocular movements are full. Visual fields are full.  Motor: The patient has good strength in all 4 extremities.  Sensory examination: Soft touch sensation is symmetric on the face, arms, and legs.  Coordination: The patient has good finger-nose-finger bilaterally.  The patient has difficulty performing heel shin on both sides.  Gait and station: The patient has a slightly wide-based gait, she can walk independently, she usually uses a walker.  Tandem gait was not attempted.  Romberg is negative. No drift is seen.  Reflexes: Deep tendon reflexes are symmetric.   Assessment/Plan:  1.  Memory disturbance  2.  Chronic depression  The patient is having diarrhea and stomach cramps  on Aricept, she will stop the medication.  We will switch her to [facility], she was given a prescription for a one-month supply to get her to 10 mg twice daily.  If she tolerates this drug, she is to contact our office and we will call in a maintenance dose prescription.  The patient will follow-up in 6 months.  Jill Alexanders MD 10/24/2017 4:11 PM  [facility] [address] [place], [place] [address]  Phone [phone] Fax [phone]

## 2017-11-04 ENCOUNTER — Ambulatory Visit (HOSPITAL_COMMUNITY)
Admission: EM | Admit: 2017-11-04 | Discharge: 2017-11-04 | Disposition: A | Discharge status: Home / Self Care | Payer: PPO | Attending: Internal Medicine | Admitting: Internal Medicine

## 2017-11-04 ENCOUNTER — Encounter (HOSPITAL_COMMUNITY): Payer: Self-pay | Admitting: Emergency Medicine

## 2017-11-04 DIAGNOSIS — R300 Dysuria: Secondary | ICD-10-CM | POA: Diagnosis not present

## 2017-11-04 LAB — POCT URINALYSIS DIP (DEVICE)
Bilirubin Urine: NEGATIVE
Glucose, UA: NEGATIVE mg/dL
Hgb urine dipstick: NEGATIVE
Ketones, ur: NEGATIVE mg/dL
Leukocytes, UA: NEGATIVE
Nitrite: NEGATIVE
Protein, ur: NEGATIVE mg/dL
Specific Gravity, Urine: >=1.03 (ref 1.005–1.030)
Urobilinogen, UA: 0.2 mg/dL (ref 0.0–1.0)
pH: 5.5 (ref 5.0–8.0)

## 2017-11-04 NOTE — Discharge Instructions (Signed)
Lets see what your results show before starting medication.

## 2017-11-04 NOTE — ED Provider Notes (Signed)
11/04/2017 8:06 PM   DOB: [birth year] / MRN: [id number]  SUBJECTIVE:  [name] is a 77 y.o. female presenting for dysuria and vaginal itching.  This started a few days ago.  She tells me that she no worse or better since the onset.    She is allergic to aspirin and sulfamethoxazole-trimethoprim.   She  has a past medical history of Chronic venous insufficiency, Colon, diverticulosis (Sept. 2011), DDD (degenerative disc disease), lumbosacral, Depression, DJD (degenerative joint disease), multiple sites, GERD (gastroesophageal reflux disease), Gout, Gout, History of cervical cancer (1972), TIA (transient ischemic attack) (05/13/2001), Hyperlipidemia, Hypertension, Memory disorder (12/20/2016), Mitral valve prolapse (10/27/1999), Osteoarthritis of hip, Ovarian cancer ([place]) (1972), Psychosis ([place]), Recurrent boils, Sensorineural hearing loss of both ears, and Subdural hematoma ([place]) (01/26/2017).    She  reports that she quit smoking about 4 years ago. Her smoking use included cigarettes. She smoked 0.10 packs per day. she has never used smokeless tobacco. She reports that she does not drink alcohol or use drugs. She  reports that she does not engage in sexual activity. The patient  has a past surgical history that includes Joint replacement; Abdominal hysterectomy; Oophorectomy; Lumbar disc surgery; Cervical discectomy (7/07); Lumbar laminectomy/decompression microdiscectomy (N/A, 02/02/2016); and [facility] (Bilateral, 02/12/2017).  Her family history includes Diabetes in her maternal grandfather and mother; Hearing loss in her mother; Heart disease in her mother; Other in her brother; Post-traumatic stress disorder in her father; Schizophrenia in her maternal grandmother; Stroke in her mother.  Review of Systems  Constitutional: Negative for chills and fever.  Genitourinary: Positive for dysuria. Negative for flank pain, frequency, hematuria and urgency.  Skin: Negative for itching and rash.     OBJECTIVE:  BP (!) 147/53 (BP Location: Left Arm)   Pulse 74   Temp 98.9 F (37.2 C) (Oral)   Resp 20   SpO2 100%   Physical Exam  Constitutional: She is active.  Non-toxic appearance.  Cardiovascular: Normal rate, regular rhythm, S1 normal, S2 normal, normal heart sounds and intact distal pulses. Exam reveals no gallop, no friction rub and no decreased pulses.  No murmur heard. Pulmonary/Chest: Effort normal. No stridor. No tachypnea. No respiratory distress. She has no wheezes. She has no rales.  Abdominal: She exhibits no distension.  Genitourinary: Vagina normal. Cervix exhibits discharge. Cervix exhibits no motion tenderness and no friability. No erythema, tenderness or bleeding in the vagina. No foreign body in the vagina. No signs of injury around the vagina. No vaginal discharge found.  Musculoskeletal: She exhibits no edema.  Neurological: She is alert.  Skin: Skin is warm and dry. She is not diaphoretic. No pallor.    Results for orders placed or performed during the hospital encounter of 11/04/17 (from the past 72 hour(s))  POCT urinalysis dip (device)     Status: None   Collection Time: 11/04/17  7:04 PM  Result Value Ref Range   Glucose, UA NEGATIVE NEGATIVE mg/dL   Bilirubin Urine NEGATIVE NEGATIVE   Ketones, ur NEGATIVE NEGATIVE mg/dL   Specific Gravity, Urine >=1.030 1.005 - 1.030   Hgb urine dipstick NEGATIVE NEGATIVE   pH 5.5 5.0 - 8.0   Protein, ur NEGATIVE NEGATIVE mg/dL   Urobilinogen, UA 0.2 0.0 - 1.0 mg/dL   Nitrite NEGATIVE NEGATIVE   Leukocytes, UA NEGATIVE NEGATIVE    Comment: Biochemical Testing Only. Please order routine urinalysis from main lab if confirmatory testing is needed.   No results found.  ASSESSMENT AND PLAN:  Orders Placed This  Encounter  Procedures  . Urine culture    Standing Status:   Standing    Number of Occurrences:   1  . POCT urinalysis dip (device)    Standing Status:   Standing    Number of Occurrences:   1      Dysuria: No cause is readily apparent.  Will test for yeast and will culture the urine.  If all is negative and she continues to have symptoms it would not be unreasonable to try her on some topical estogen.       The patient is advised to call or return to clinic if she does not see an improvement in symptoms, or to seek the care of the closest emergency department if she worsens with the above plan.   Philis Fendt, MHS, PA-C 11/04/2017 8:06 PM   Tereasa Coop, PA-C 11/04/17 2006

## 2017-11-04 NOTE — ED Triage Notes (Signed)
PT IS HOH   PT C/O: UTI sx onset 3 days associated w/dysuria, urinary freq/urgency  DENIES:  fevers  TAKING MEDS: none   A&O x4... NAD... Ambulatory

## 2017-11-05 DIAGNOSIS — H2511 Age-related nuclear cataract, right eye: Secondary | ICD-10-CM | POA: Diagnosis not present

## 2017-11-05 LAB — CERVICOVAGINAL ANCILLARY ONLY
Bacterial vaginitis: NEGATIVE
Candida vaginitis: NEGATIVE
Chlamydia: NEGATIVE
Neisseria Gonorrhea: NEGATIVE
Trichomonas: NEGATIVE

## 2017-11-06 LAB — URINE CULTURE: Culture: <10000 — AB

## 2017-11-20 ENCOUNTER — Other Ambulatory Visit: Payer: Self-pay | Admitting: *Deleted

## 2017-11-20 ENCOUNTER — Ambulatory Visit (INDEPENDENT_AMBULATORY_CARE_PROVIDER_SITE_OTHER): Payer: PPO | Admitting: Family Medicine

## 2017-11-20 ENCOUNTER — Encounter: Payer: Self-pay | Admitting: Family Medicine

## 2017-11-20 VITALS — BP 130/78 | HR 73 | Temp 98.8°F | Ht 62.0 in | Wt 214.6 lb

## 2017-11-20 DIAGNOSIS — R5381 Other malaise: Secondary | ICD-10-CM

## 2017-11-20 DIAGNOSIS — I10 Essential (primary) hypertension: Secondary | ICD-10-CM | POA: Diagnosis not present

## 2017-11-20 DIAGNOSIS — R197 Diarrhea, unspecified: Secondary | ICD-10-CM | POA: Diagnosis not present

## 2017-11-20 DIAGNOSIS — R32 Unspecified urinary incontinence: Secondary | ICD-10-CM

## 2017-11-20 DIAGNOSIS — N952 Postmenopausal atrophic vaginitis: Secondary | ICD-10-CM

## 2017-11-20 MED ORDER — FLORANEX PO PACK: 1.0000 g | PACK | Freq: Three times a day (TID) | ORAL | 11 refills | Status: DC

## 2017-11-20 MED ORDER — ESTROGENS, CONJUGATED 0.625 MG/GM VA CREA: 1.0000 | TOPICAL_CREAM | Freq: Every day | VAGINAL | 12 refills | Status: DC

## 2017-11-20 NOTE — Assessment & Plan Note (Signed)
No red flag signs or symptoms.  Her weight is stable.  Her abdominal exam today is stable.  History is somewhat limited due to patient's mental illness and dementia, however her lack of red flag signs or symptoms is reassuring.  Advised patient to start probiotic as well as daily fiber supplementation.  Given her intermittent bouts of diarrhea for the past couple of months, it is reasonable to check for infectious causes.  I will place an order for GI pathogen panel and stool culture.  If symptoms continue, where if patient develops more abdominal pain, would consider abdominal CT for further evaluation.

## 2017-11-20 NOTE — Assessment & Plan Note (Signed)
At goal.  Continue HCTZ 12.5 mg daily as well as losartan 100 mg daily.

## 2017-11-20 NOTE — Patient Outreach (Signed)
[facility]) Care Management  11/20/2017  [name] 06-10-1941 [id number]    Contacted by Director of Care Management regarding recent calls to 24 hour nurse triage line.  Also noted that patient has had 2 ED visits since last contact by this care manager.  Case was closed on 12/4 due to inability to maintain contact with patient and daughter, unclear if she is now living with daughter.  Noted that member has appointment with primary MD today.  Call placed to office, spoke with Amber regarding potential help/resources that [facility] could provide to patient and daughter if they are open.  Request made to clarify current residence and again offer [facility] services if needed.  Amber will either place referral or contact this care manager directly if [facility] is recommended.  Will not open at this time, will await call back from MD office.  [name], [place], MSN [place] [phone]

## 2017-11-20 NOTE — Assessment & Plan Note (Addendum)
With vaginal irritation and urinary incontinence.  We will restart her Premarin cream today to see if this helps.  We will also give DME order for incontinence supplies.

## 2017-11-20 NOTE — Patient Instructions (Signed)
Please restart the Premarin cream  I would also like for you to start probiotic and fiber supplement such as Metamucil or Benefiber.  Please bring a stool sample soon to make sure you not having any sort of infection.  If your symptoms worsen or do not improve, we may need to get an abdominal CT.  I will also place an order for a Rollator in for incontinence supplies.  I am glad that you are doing well otherwise.  We will not make any other medication changes today.  Come back to see me in 6 months, or sooner as needed.  Take care, Dr. [name]

## 2017-11-20 NOTE — Progress Notes (Signed)
    Subjective:  [name] is a 77 y.o. female who presents today with a chief complaint of hypertension follow-up.   HPI:  Hypertension, established problem, Stable BP Readings from Last 3 Encounters:  11/20/17 130/78  11/04/17 (!) 147/53  10/24/17 (!) 183/97  Currently on HCTZ 12.5 mg daily and losartan 100 mg daily.  Tolerating these well without side effects.  ROS: Denies any chest Drake, shortness of breath, dyspnea on exertion, leg edema.   Abdominal Drake, new issue Started about 2 months ago.  Located in her lower abdomen.  Associated with some loose stools and some constipation.  Also concerned because she has had some green stools.  No fevers or chills.  No weight loss.  No night sweats.  Normal appetite.  No home treatments tried.  She was recently seen in urgent care for dysuria which has since resolved.  No hematuria.  No melena.  No recent antibiotics.  Urinary incontinence, established problem Patient's daughter would like for patient to have her Premarin refilled to see if this helps.  Also would like a prescription for pads and incontinence supplies.  ROS: Per HPI  PMH: She reports that she quit smoking about 4 years ago. Her smoking use included cigarettes. She smoked 0.10 packs per day. she has never used smokeless tobacco. She reports that she does not drink alcohol or use drugs.   Objective:  Physical Exam: BP 130/78 (BP Location: Right Arm, Patient Position: Sitting, Cuff Size: Large)   Pulse 73   Temp 98.8 F (37.1 C) (Oral)   Ht 5\' 2"  (1.575 m)   Wt 214 lb 9.6 oz (97.3 kg)   SpO2 96%   BMI 39.25 kg/m   Gen: NAD, resting comfortably CV: RRR with no murmurs appreciated Pulm: NWOB, CTAB with no crackles, wheezes, or rhonchi GI: Normal bowel sounds present. Soft, Nontender, Nondistended. MSK: No edema, cyanosis, or clubbing noted Skin: Warm, dry Neuro: Grossly normal, moves all extremities Psych: Normal affect and thought  content  Assessment/Plan:  Diarrhea No red flag signs or symptoms.  Her weight is stable.  Her abdominal exam today is stable.  History is somewhat limited due to patient's mental illness and dementia, however her lack of red flag signs or symptoms is reassuring.  Advised patient to start probiotic as well as daily fiber supplementation.  Given her intermittent bouts of diarrhea for the past couple of months, it is reasonable to check for infectious causes.  I will place an order for GI pathogen panel and stool culture.  If symptoms continue, where if patient develops more abdominal Drake, would consider abdominal CT for further evaluation.  Essential hypertension At goal.  Continue HCTZ 12.5 mg daily as well as losartan 100 mg daily.  Postmenopausal atrophic vaginitis With vaginal irritation and urinary incontinence.  We will restart her Premarin cream today to see if this helps.  We will also give DME order for incontinence supplies.  Age-related debility Placed DME order for rolling walker.  Algis Greenhouse. Shelby Pain, MD 11/20/2017 5:11 PM

## 2017-11-22 ENCOUNTER — Other Ambulatory Visit: Payer: Self-pay | Admitting: Internal Medicine

## 2017-11-23 ENCOUNTER — Other Ambulatory Visit: Payer: Self-pay | Admitting: Family Medicine

## 2017-11-28 ENCOUNTER — Other Ambulatory Visit: Payer: Self-pay

## 2017-12-06 ENCOUNTER — Ambulatory Visit (HOSPITAL_COMMUNITY): Payer: Self-pay | Admitting: Psychiatry

## 2017-12-24 ENCOUNTER — Telehealth: Payer: Self-pay | Admitting: Family Medicine

## 2017-12-24 NOTE — Telephone Encounter (Signed)
See note

## 2017-12-24 NOTE — Telephone Encounter (Signed)
Copied from [place]. Topic: Quick Communication - See Telephone Encounter >> Dec 24, 2017 12:13 PM Ivar Drape wrote: CRM for notification. See Telephone encounter for:  12/24/17. Patient is having bad arthritis in both hands and she would like something called in for it. She would like it to be in generic form.  She would like it sent in to  [place], [place] [phone] (Phone) [phone] (Fax)

## 2017-12-24 NOTE — Telephone Encounter (Signed)
LM for patient's daughter, [name].  Patient has dementia and it is better to try to speak with her daughter.  CRM started.  OK to relay Dr. Marigene Ehlers instructions.

## 2017-12-24 NOTE — Telephone Encounter (Signed)
Please advise 

## 2017-12-24 NOTE — Telephone Encounter (Signed)
Patient has both tylenol and tramadol on her med list.  She can take up to 1000mg  of tylenol per day and her tramadol as prescribed.  We can send in a refill for tramadol if she needs it.  Algis Greenhouse. [name], MD 12/24/2017 2:49 PM

## 2017-12-26 DIAGNOSIS — H5213 Myopia, bilateral: Secondary | ICD-10-CM | POA: Diagnosis not present

## 2017-12-27 ENCOUNTER — Other Ambulatory Visit (HOSPITAL_COMMUNITY): Payer: Self-pay | Admitting: Psychiatry

## 2017-12-27 ENCOUNTER — Other Ambulatory Visit: Payer: Self-pay | Admitting: Internal Medicine

## 2017-12-27 DIAGNOSIS — F200 Paranoid schizophrenia: Secondary | ICD-10-CM

## 2017-12-28 NOTE — Telephone Encounter (Signed)
LM again for patient to return call.

## 2018-01-01 ENCOUNTER — Other Ambulatory Visit: Payer: Self-pay | Admitting: Family Medicine

## 2018-01-09 NOTE — Telephone Encounter (Signed)
Spoke with patient and asked about tramadol.  Patient states that she is still taking it and that it doesn't help.  States that her "fingers stick together like glue and she cannot move them".  Also stated that ever since she was in the hospital she has been "messed up on the inside" and "the [facility] doctors messed her insides up" and she wants and xray of her insides because she has had bleeding from her "anus, vagina, and everywhere" since the "[facility] doctors messed her insides up".  Advised the patient that she would need an appointment with Dr. [name] to evaluate her pain and also any other symptoms before he would be able to prescribe any other medication or make any referrals

## 2018-01-09 NOTE — Telephone Encounter (Signed)
De Nurse Call back [phone] Pharmacy: [facility], [place]  [address] [place] [address] [place] [address]  Phone: [phone] Fax: [phone]   Pt called in again asking for medication for arthritis in her hands. Pt states that we don't need to contact her daughter, we need to contact her. Pt became very upset when advised we LM for her daughter.   Pt states that she needs a referral to a Psychologist, sport and exercise. She said that she needs an xray on her insides. She said she's had surgery before for falls and had blood in her insides.   Pt said she needs her doctor to talk to her not her daughter about what she has and what she needs. Pt requesting a call back.

## 2018-01-09 NOTE — Telephone Encounter (Signed)
Patient stated she will call back to get an appointment as she has to call her daughter first to get a ride.

## 2018-01-09 NOTE — Telephone Encounter (Signed)
See note

## 2018-01-24 ENCOUNTER — Other Ambulatory Visit (HOSPITAL_COMMUNITY): Payer: Self-pay

## 2018-01-24 DIAGNOSIS — F200 Paranoid schizophrenia: Secondary | ICD-10-CM

## 2018-01-24 MED ORDER — BUPROPION HCL ER (XL) 300 MG PO TB24: 300.0000 mg | ORAL_TABLET | Freq: Every morning | ORAL | 2 refills | Status: DC

## 2018-01-31 ENCOUNTER — Telehealth: Payer: Self-pay

## 2018-01-31 ENCOUNTER — Encounter: Payer: Self-pay | Admitting: Family Medicine

## 2018-01-31 ENCOUNTER — Ambulatory Visit (INDEPENDENT_AMBULATORY_CARE_PROVIDER_SITE_OTHER): Payer: PPO | Admitting: Family Medicine

## 2018-01-31 DIAGNOSIS — M1990 Unspecified osteoarthritis, unspecified site: Secondary | ICD-10-CM | POA: Diagnosis not present

## 2018-01-31 DIAGNOSIS — F1120 Opioid dependence, uncomplicated: Secondary | ICD-10-CM

## 2018-01-31 DIAGNOSIS — I5032 Chronic diastolic (congestive) heart failure: Secondary | ICD-10-CM

## 2018-01-31 DIAGNOSIS — R413 Other amnesia: Secondary | ICD-10-CM

## 2018-01-31 DIAGNOSIS — F209 Schizophrenia, unspecified: Secondary | ICD-10-CM | POA: Diagnosis not present

## 2018-01-31 HISTORY — DX: Opioid dependence, uncomplicated: F11.20

## 2018-01-31 MED ORDER — DICLOFENAC SODIUM 1 % TD GEL: 4.0000 g | Freq: Four times a day (QID) | TRANSDERMAL | 5 refills | Status: DC

## 2018-01-31 NOTE — Assessment & Plan Note (Signed)
Stable. Continue wellbutrin and hydroxyzine.

## 2018-01-31 NOTE — Patient Instructions (Signed)
Please try the voltaren gel.  Come back to see me in about 3 months, or sooner as needed.  Take care, Dr [name]

## 2018-01-31 NOTE — Telephone Encounter (Signed)
FMLA paperwork for patient's daughter has been filled out, signed, and faxed.

## 2018-01-31 NOTE — Assessment & Plan Note (Signed)
Stable. Continue namenda. °

## 2018-01-31 NOTE — Progress Notes (Signed)
    Subjective:  [name] is a 77 y.o. female who presents today with a chief complaint of arthritis and to have FMLA paperwork filled out for her daughter. [name]   HPI:  Arthritis, established problem, worsening Several year history.  Located in her hips and hands bilaterally.  Uses Tylenol as needed.  Also uses tramadol occasionally.  Pain is little bit worse since her last visit.  Request additional medication.  History of dementia and schizophrenia, established problems, stable Daughter also request FMLA paperwork be filled out for both of these conditions.  Daughter reports that she takes her mother to all of her doctor's appointments including her psychiatrist and neurologist for the above problems.  Overall, both of these conditions are stable.  She is currently on Namenda and Wellbutrin.  Tolerating both these well without side effects.  ROS: Per HPI  PMH: She reports that she quit smoking about 4 years ago. Her smoking use included cigarettes. She smoked 0.10 packs per day. She has never used smokeless tobacco. She reports that she does not drink alcohol or use drugs.   Objective:  Physical Exam: BP 130/78 (BP Location: Left Arm)   Pulse 77   Temp 97.7 F (36.5 C)   Ht 5\' 2"  (1.575 m)   Wt 214 lb (97.1 kg)   SpO2 97%   BMI 39.14 kg/m   Gen: NAD, resting comfortably MSK: Hypertrophy noted at DIP and PIP joints of hands bilaterally. Skin: Warm, dry Neuro: Grossly normal, moves all extremities Psych: Normal affect and thought content  Assessment/Plan:  Osteoarthritis Continue tylenol and tramadol. Add voltargen gel to regimen. May need to be seen back at orthopedics or sports medicine if not improving.   Schizophrenia, chronic condition ([place]) Stable. Continue wellbutrin and hydroxyzine.   Memory disorder Stable. Continue namenda.   Chronic diastolic heart failure ([facility]) Stable. No signs of volume overload.   FMLA paperwork for patient's daughter was completed  and faxed.  Algis Greenhouse. [name], MD 01/31/2018 1:07 PM

## 2018-01-31 NOTE — Assessment & Plan Note (Signed)
Continue tylenol and tramadol. Add voltargen gel to regimen. May need to be seen back at orthopedics or sports medicine if not improving.

## 2018-01-31 NOTE — Assessment & Plan Note (Signed)
Stable. No signs of volume overload.

## 2018-03-05 ENCOUNTER — Ambulatory Visit (INDEPENDENT_AMBULATORY_CARE_PROVIDER_SITE_OTHER): Payer: PPO | Admitting: Psychiatry

## 2018-03-05 DIAGNOSIS — F419 Anxiety disorder, unspecified: Secondary | ICD-10-CM | POA: Diagnosis not present

## 2018-03-05 DIAGNOSIS — F200 Paranoid schizophrenia: Secondary | ICD-10-CM | POA: Diagnosis not present

## 2018-03-05 DIAGNOSIS — R51 Headache: Secondary | ICD-10-CM | POA: Diagnosis not present

## 2018-03-05 DIAGNOSIS — G8929 Other chronic pain: Secondary | ICD-10-CM

## 2018-03-05 DIAGNOSIS — M2550 Pain in unspecified joint: Secondary | ICD-10-CM | POA: Diagnosis not present

## 2018-03-05 DIAGNOSIS — R109 Unspecified abdominal pain: Secondary | ICD-10-CM | POA: Diagnosis not present

## 2018-03-05 DIAGNOSIS — M549 Dorsalgia, unspecified: Secondary | ICD-10-CM | POA: Diagnosis not present

## 2018-03-05 DIAGNOSIS — Z818 Family history of other mental and behavioral disorders: Secondary | ICD-10-CM

## 2018-03-05 DIAGNOSIS — Z87891 Personal history of nicotine dependence: Secondary | ICD-10-CM

## 2018-03-05 MED ORDER — HYDROXYZINE PAMOATE 25 MG PO CAPS: 25.0000 mg | ORAL_CAPSULE | Freq: Every day | ORAL | 2 refills | Status: DC

## 2018-03-05 MED ORDER — BUPROPION HCL ER (XL) 300 MG PO TB24: 300.0000 mg | ORAL_TABLET | Freq: Every morning | ORAL | 2 refills | Status: DC
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
## 2018-03-13 ENCOUNTER — Encounter (HOSPITAL_COMMUNITY): Payer: Self-pay | Admitting: Emergency Medicine

## 2018-03-13 ENCOUNTER — Emergency Department (HOSPITAL_COMMUNITY)
Admission: EM | Admit: 2018-03-13 | Discharge: 2018-03-13 | Disposition: A | Discharge status: Home / Self Care | Payer: PPO | Attending: Emergency Medicine | Admitting: Emergency Medicine

## 2018-03-13 DIAGNOSIS — Z79899 Other long term (current) drug therapy: Secondary | ICD-10-CM | POA: Insufficient documentation

## 2018-03-13 DIAGNOSIS — Z87891 Personal history of nicotine dependence: Secondary | ICD-10-CM | POA: Diagnosis not present

## 2018-03-13 DIAGNOSIS — N183 Chronic kidney disease, stage 3 (moderate): Secondary | ICD-10-CM | POA: Insufficient documentation

## 2018-03-13 DIAGNOSIS — I5032 Chronic diastolic (congestive) heart failure: Secondary | ICD-10-CM | POA: Diagnosis not present

## 2018-03-13 DIAGNOSIS — R140 Abdominal distension (gaseous): Secondary | ICD-10-CM | POA: Diagnosis not present

## 2018-03-13 DIAGNOSIS — J45909 Unspecified asthma, uncomplicated: Secondary | ICD-10-CM | POA: Insufficient documentation

## 2018-03-13 DIAGNOSIS — I130 Hypertensive heart and chronic kidney disease with heart failure and stage 1 through stage 4 chronic kidney disease, or unspecified chronic kidney disease: Secondary | ICD-10-CM | POA: Insufficient documentation

## 2018-03-13 DIAGNOSIS — K6289 Other specified diseases of anus and rectum: Secondary | ICD-10-CM | POA: Insufficient documentation

## 2018-03-13 DIAGNOSIS — K5900 Constipation, unspecified: Secondary | ICD-10-CM | POA: Diagnosis not present

## 2018-03-13 DIAGNOSIS — R197 Diarrhea, unspecified: Secondary | ICD-10-CM | POA: Insufficient documentation

## 2018-03-13 LAB — URINALYSIS, ROUTINE W REFLEX MICROSCOPIC
Bilirubin Urine: NEGATIVE
Glucose, UA: NEGATIVE mg/dL
Hgb urine dipstick: NEGATIVE
Ketones, ur: NEGATIVE mg/dL
Leukocytes, UA: NEGATIVE
Nitrite: NEGATIVE
Protein, ur: NEGATIVE mg/dL
Specific Gravity, Urine: 1.015 (ref 1.005–1.030)
pH: 5 (ref 5.0–8.0)

## 2018-03-13 LAB — CBC
HCT: 37.6 % (ref 36.0–46.0)
Hemoglobin: 12.1 g/dL (ref 12.0–15.0)
MCH: 31.8 pg (ref 26.0–34.0)
MCHC: 32.2 g/dL (ref 30.0–36.0)
MCV: 98.9 fL (ref 78.0–100.0)
Platelets: 203 10*3/uL (ref 150–400)
RBC: 3.8 MIL/uL — ABNORMAL LOW (ref 3.87–5.11)
RDW: 15.1 % (ref 11.5–15.5)
WBC: 7.2 10*3/uL (ref 4.0–10.5)

## 2018-03-13 LAB — COMPREHENSIVE METABOLIC PANEL
ALT: 20 U/L (ref 14–54)
AST: 20 U/L (ref 15–41)
Albumin: 3.7 g/dL (ref 3.5–5.0)
Alkaline Phosphatase: 106 U/L (ref 38–126)
Anion gap: 8 (ref 5–15)
BUN: 21 mg/dL — ABNORMAL HIGH (ref 6–20)
CO2: 23 mmol/L (ref 22–32)
Calcium: 9.3 mg/dL (ref 8.9–10.3)
Chloride: 109 mmol/L (ref 101–111)
Creatinine, Ser: 1.32 mg/dL — ABNORMAL HIGH (ref 0.44–1.00)
GFR calc Af Amer: 44 mL/min — ABNORMAL LOW (ref >60)
GFR calc non Af Amer: 38 mL/min — ABNORMAL LOW (ref >60)
Glucose, Bld: 91 mg/dL (ref 65–99)
Potassium: 4.1 mmol/L (ref 3.5–5.1)
Sodium: 140 mmol/L (ref 135–145)
Total Bilirubin: 0.4 mg/dL (ref 0.3–1.2)
Total Protein: 6.3 g/dL — ABNORMAL LOW (ref 6.5–8.1)

## 2018-03-13 LAB — I-STAT CG4 LACTIC ACID, ED: Lactic Acid, Venous: 0.76 mmol/L (ref 0.5–1.9)

## 2018-03-13 LAB — LIPASE, BLOOD: Lipase: 29 U/L (ref 11–51)

## 2018-03-13 MED ORDER — HYDROCORTISONE ACE-PRAMOXINE 1-1 % RE CREA: 1.0000 "application " | TOPICAL_CREAM | Freq: Two times a day (BID) | RECTAL | 0 refills | Status: DC

## 2018-03-13 MED ORDER — LIDOCAINE HCL 4 % EX SOLN
Freq: Once | CUTANEOUS | Status: AC
  Administered 2018-03-13: 22:00:00 via TOPICAL
  Filled 2018-03-13: qty 50

## 2018-03-13 NOTE — ED Provider Notes (Signed)
[facility] EMERGENCY DEPARTMENT Provider Note   CSN: [id number] Arrival date & time: 03/13/18  1803    History   Chief Complaint Chief Complaint  Patient presents with  . Constipation  . Diarrhea    HPI [name] is a 77 y.o. female.  HPI  The history is provided by the patient.  Constipation   This is a new problem. The current episode started more than 2 days ago. The stool is described as loose. Pertinent negatives include no abdominal pain and no dysuria. Treatments tried: unspecified laxative. The treatment provided significant (Constipation resolved, now having diarrhea) relief. Risk factors include a change in medication usage/dosage.  Diarrhea   This is a new problem. The current episode started yesterday. The problem occurs 2 to 4 times per day. The problem has not changed since onset.The stool consistency is described as blood tinged. There has been no fever. Pertinent negatives include no abdominal pain. Associated symptoms comments: Rectal pain. She has tried nothing for the symptoms.    Past Medical History:  Diagnosis Date  . Chronic venous insufficiency   . Colon, diverticulosis Sept. 2011   outpatient colonoscopy by Dr. Cristina Gong.  Need record  . DDD (degenerative disc disease), lumbosacral   . Depression   . DJD (degenerative joint disease), multiple sites    Low back pain worst  . GERD (gastroesophageal reflux disease)   . Gout   . Gout    Uric Acid level 4.2 on 300 allopurinol  . History of cervical cancer 1972  . Hx-TIA (transient ischemic attack) 05/13/2001   Right facial numbness  . Hyperlipidemia   . Hypertension   . Memory disorder 12/20/2016  . Mitral valve prolapse 10/27/1999   Subsequent 2D echo show normal mitral valve  . Osteoarthritis of hip    bilateral hips  . Ovarian cancer ([place]) 1972   S/P oophorectomy  . Psychosis ([place])   . Recurrent boils    History of MRSA skin infections with abscess  . Sensorineural  hearing loss of both ears   . Subdural hematoma ([place]) 01/26/2017   bilateral    Patient Active Problem List   Diagnosis Date Noted  . Osteoarthritis 01/31/2018  . Uncomplicated opioid dependence ([place]) 01/31/2018  . Diarrhea 11/20/2017  . Frequent urination 07/31/2017  . Lower extremity edema 07/31/2017  . History of bilateral hip replacements   . Memory disorder 12/20/2016  . History of lumbar laminectomy for spinal cord decompression 02/02/2016  . Atopic dermatitis 07/09/2015  . Reactive airway disease 08/11/2014  . History of cancer 08/11/2014  . OSA (obstructive sleep apnea) 08/11/2014  . Chronic kidney disease (CKD), stage III (moderate) ([place]) 10/04/2012  . Schizophrenia, chronic condition ([place]) 01/20/2012  . Postmenopausal atrophic vaginitis 11/01/2010  . Chronic diastolic heart failure ([place]) 05/06/2010  . Allergic rhinitis 10/14/2009  . Obesity, Class II, BMI 35-39.9, with comorbidity 05/14/2009  . Chronic prescription opiate use 04/29/2008  . Normocytic anemia 09/25/2007  . Hyperlipidemia 09/03/2006  . Gout 09/03/2006  . Essential hypertension 09/03/2006  . GERD 09/03/2006  . Hx-TIA (transient ischemic attack) 05/13/2001    Past Surgical History:  Procedure Laterality Date  . ABDOMINAL HYSTERECTOMY     1972  . BURR HOLE Bilateral 02/12/2017   Procedure: Haskell Flirt;  Surgeon: Earnie Larsson, MD;  Location: [facility];  Service: Neurosurgery;  Laterality: Bilateral;  . CERVICAL DISCECTOMY  7/07   C5-C6  . JOINT REPLACEMENT     bilateral hip replacement  . LUMBAR  DISC SURGERY     L5-S1 7/07  . LUMBAR LAMINECTOMY/DECOMPRESSION MICRODISCECTOMY N/A 02/02/2016   Procedure: L4-5 Decompression;  Surgeon: Marybelle Killings, MD;  Location: [facility];  Service: Orthopedics;  Laterality: N/A;  . [place] for ovarian cancer     OB History   None      Home Medications    Prior to Admission medications   Medication Sig Start Date End Date Taking? Authorizing Provider    acetaminophen (TYLENOL) 500 MG tablet Take 500 mg by mouth every 8 (eight) hours as needed.    [provider]  allopurinol (ZYLOPRIM) 300 MG tablet TAKE 1 TABLET BY MOUTH DAILY 04/18/17   Bartholomew Crews, MD  ascorbic acid (VITAMIN C) 500 MG tablet Take 500 mg by mouth daily.    [provider]  atorvastatin (LIPITOR) 40 MG tablet Take 1 tablet (40 mg total) by mouth daily. 06/07/17   Bartholomew Crews, MD  buPROPion (WELLBUTRIN XL) 300 MG 24 hr tablet Take 1 tablet (300 mg total) by mouth every morning. 03/05/18   Arfeen, Arlyce Harman, MD  diclofenac sodium (VOLTAREN) 1 % GEL Apply 4 g topically 4 (four) times daily. 01/31/18   Vivi Barrack, MD  fluticasone [facility]) 50 MCG/ACT nasal spray INSTILL 2 SPRAYS IN [facility] NOSTRIL DAILY 11/23/17   Vivi Barrack, MD  hydrochlorothiazide (HYDRODIURIL) 12.5 MG tablet Take 1 tablet (12.5 mg total) by mouth daily. 08/02/17   Vivi Barrack, MD  hydrOXYzine (VISTARIL) 25 MG capsule Take 1 capsule (25 mg total) by mouth at bedtime. 03/05/18   Arfeen, Arlyce Harman, MD  lactobacillus (FLORANEX/LACTINEX) PACK Take 1 packet (1 g total) by mouth 3 (three) times daily with meals. 11/20/17   Vivi Barrack, MD  losartan (COZAAR) 100 MG tablet TAKE ONE TABLET BY MOUTH DAILY 01/01/18   Vivi Barrack, MD  memantine [facility]) 5 MG tablet Take 1 tablet daily for one week, then take 1 tablet twice daily for one week, then take 1 tablet in the morning and 2 in the evening for one week, then take 2 tablets twice daily 10/24/17   Kathrynn Ducking, MD  Multiple Vitamins-Minerals (ALIVE WOMENS ENERGY) TABS Take 1 tablet by mouth daily.     [provider]  pantoprazole (PROTONIX) 40 MG tablet TAKE 1 TABLET BY MOUTH TWICE DAILY BEFORE A MEAL 04/18/17   Bartholomew Crews, MD  potassium chloride (MICRO-K) 10 MEQ CR capsule TAKE 1 CAPSULE BY MOUTH DAILY 04/18/17   Bartholomew Crews, MD  pramoxine-hydrocortisone (PROCTOCREAM-HC) 1-1 % rectal cream Place 1  application rectally 2 (two) times daily. 03/13/18   Clifton James, MD  traMADol Veatrice [place]) 50 MG tablet TAKE 1 TABLET BY MOUTH TWICE DAILY AS NEEDED 10/03/17   Vivi Barrack, MD    Family History Family History  Problem Relation Age of Onset  . Diabetes Mother   . Heart disease Mother   . Hearing loss Mother   . Stroke Mother   . Post-traumatic stress disorder Father   . Schizophrenia Maternal Grandmother   . Diabetes Maternal Grandfather   . Other Brother        brother died in mental health hospital  . Kidney cancer Neg Hx   . Bladder Cancer Neg Hx     Social History Social History   Tobacco Use  . Smoking status: Former Smoker    Packs/day: 0.10    Types: Cigarettes    Last  attempt to quit: 04/08/2013    Years since quitting: 4.9  . Smokeless tobacco: Never Used  Substance Use Topics  . Alcohol use: No    Alcohol/week: 0.0 oz  . Drug use: No     Allergies   Aspirin and Sulfamethoxazole-trimethoprim   Review of Systems Review of Systems  Constitutional: Negative for chills and fever.  HENT: Negative for ear pain and sore throat.   Eyes: Negative for pain and visual disturbance.  Respiratory: Negative for cough and shortness of breath.   Cardiovascular: Negative for chest pain and palpitations.  Gastrointestinal: Positive for constipation, diarrhea and rectal pain. Negative for abdominal pain and vomiting.  Genitourinary: Negative for dysuria and hematuria.  Musculoskeletal: Negative for arthralgias and back pain.  Skin: Negative for color change and rash.  Neurological: Negative for seizures and syncope.  All other systems reviewed and are negative.    Physical Exam Updated Vital Signs BP (!) 137/119 (BP Location: Right Arm)   Pulse 76   Temp 98.6 F (37 C) (Oral)   Resp 20   SpO2 100%   Physical Exam  Constitutional: She appears well-developed and well-nourished. No distress.  HENT:  Head: Normocephalic and atraumatic.  Eyes: Conjunctivae  are normal.  Neck: Neck supple.  Cardiovascular: Normal rate and regular rhythm.  No murmur heard. Pulmonary/Chest: Effort normal and breath sounds normal. No respiratory distress.  Abdominal: Soft. There is no tenderness.  Genitourinary:  Genitourinary Comments: Light brown stool.  Rectal vault has small amount of loose stool present.  No impaction.  Musculoskeletal: She exhibits no edema.  Neurological: She is alert.  Skin: Skin is warm and dry.  Psychiatric: She has a normal mood and affect.  Nursing note and vitals reviewed.    ED Treatments / Results  Labs (all labs ordered are listed, but only abnormal results are displayed) Labs Reviewed  COMPREHENSIVE METABOLIC PANEL - Abnormal; Notable for the following components:      Result Value   BUN 21 (*)    Creatinine, Ser 1.32 (*)    Total Protein 6.3 (*)    GFR calc non Af Amer 38 (*)    GFR calc Af Amer 44 (*)    All other components within normal limits  CBC - Abnormal; Notable for the following components:   RBC 3.80 (*)    All other components within normal limits  LIPASE, BLOOD  URINALYSIS, ROUTINE W REFLEX MICROSCOPIC  I-STAT CG4 LACTIC ACID, ED  I-STAT CG4 LACTIC ACID, ED    EKG None  Radiology No results found.  Procedures Procedures (including critical care time)  Medications Ordered in ED Medications  lidocaine (XYLOCAINE) 4 % external solution ( Topical Given 03/13/18 2133)     Initial Impression / Assessment and Plan / ED Course  I have reviewed the triage vital signs and the nursing notes.  Pertinent labs & imaging results that were available during my care of the patient were reviewed by me and considered in my medical decision making (see chart for details).    Patient is a 77 year old female with complicated medical history as above who presents with diarrhea following laxative use after having constipation for the last 3 days.  Patient reports taking an unspecified laxative after 3 days of  no bowel movement.  She had no vomiting or nausea during that time.  Yesterday, she began having large amounts of diarrhea.  She reports they are occasionally blood-streaked.  She is also having rectal pain.  Here, patient is  very well-appearing.  She has no abdominal pain.  She denied any urinary symptoms.  On her rectal exam, she has very light brown stool.  No blood present.  She does have tenderness around her anus.  I suspect her diarrhea is secondary to her laxative use.  She has not had any recent antibiotics to make me concerned for C. difficile.  Her labs are reassuring.  Elevated creatinine noted which is within her baseline.  The plan will be for Proctofoam for her rectal pain.  I advised her to stop taking the laxative.  I also have asked her to follow-up with her primary care physician as soon as possible.  Return precautions were discussed.  Patient discharged in stable condition.  Final Clinical Impressions(s) / ED Diagnoses   Final diagnoses:  Constipation, unspecified constipation type  Diarrhea, unspecified type  Rectal pain    ED Discharge Orders        Ordered    pramoxine-hydrocortisone (PROCTOCREAM-HC) 1-1 % rectal cream  2 times daily,   Status:  Discontinued     03/13/18 2239    pramoxine-hydrocortisone (PROCTOCREAM-HC) 1-1 % rectal cream  2 times daily     03/13/18 2247       Clifton James, MD 03/14/18 Dyann Kief    Quintella Reichert, MD 03/14/18 1427

## 2018-03-13 NOTE — ED Notes (Signed)
Pt alert and oriented x4. Lawyer. Pt in waiting room

## 2018-03-13 NOTE — ED Notes (Signed)
All numbers and contacts on pt  Record has been called. No response. Pt states does not have money for a cab

## 2018-03-13 NOTE — ED Triage Notes (Signed)
Pt presents with [facility] for constipation x 3 days and diarrhea today; states she did an enema today; now having small amt of bloody diarrhea and rectal pain; abd appears distended; hx of bowel obstructions

## 2018-03-13 NOTE — ED Notes (Signed)
Pt states has no transportation to get home. Daughter does not answer the phone

## 2018-03-13 NOTE — ED Notes (Signed)
purewick placed for urine spec collection Vital signs stable Pt alert no family @ bedside currently

## 2018-03-15 ENCOUNTER — Telehealth: Payer: Self-pay | Admitting: *Deleted

## 2018-03-15 NOTE — Telephone Encounter (Signed)
Pharmacy called related to Rx: proctocream not coming in cream form- comes in foam ..Marland KitchenEDCM clarified with EDP (Lawyer) to change Rx to: foam.

## 2018-03-18 ENCOUNTER — Telehealth: Payer: Self-pay | Admitting: Family Medicine

## 2018-03-18 ENCOUNTER — Other Ambulatory Visit: Payer: Self-pay | Admitting: *Deleted

## 2018-03-18 ENCOUNTER — Encounter: Payer: Self-pay | Admitting: *Deleted

## 2018-03-18 NOTE — Telephone Encounter (Signed)
Please see note does not have a message, please note if this was an error.

## 2018-03-18 NOTE — Patient Outreach (Signed)
[facility]) Care Management  03/18/2018  ELVERA ALMARIO Nov 17, 1940 [id number]  Referral from [facility]; Reason;:Referral for copay assistance. Member also receives HHA but states she needs more days or hours because she can barely walk. She currently receives 8 hours per week for HHA. Her daughter helps out when she's not working."  Telephone call to patient; left HIPPA compliant voice mail requesting call back.  Plan: Geophysicist/field seismologist. Follow up 2-4 business days.  Sherrin Daisy, RN BSN [place] Management Coordinator [facility] Care Management  [phone]

## 2018-03-21 ENCOUNTER — Telehealth: Payer: Self-pay | Admitting: Family Medicine

## 2018-03-21 ENCOUNTER — Ambulatory Visit: Payer: Self-pay | Admitting: *Deleted

## 2018-03-21 NOTE — Telephone Encounter (Signed)
Pt requesting refill of Pramoxine-hydrocortisone-HC rectal cream that she was prescribed during an ED visit on 03/13/18 Last OV: 01/31/18 Pharmacy: [facility]

## 2018-03-21 NOTE — Telephone Encounter (Signed)
Copied from [place] [id number]. Topic: Quick Communication - Rx Refill/Question >> Mar 21, 2018  6:43 PM [name] wrote: Call pt to advise; pt was told that she would probably need to contact the physician who gave the Rx initially   Medication: pramoxine-hydrocortisone (PROCTOCREAM-HC) 1-1 % rectal cream [[id number]]   Has the patient contacted their pharmacy? Yes.   (Agent: If no, request that the patient contact the pharmacy for the refill.) (Agent: If yes, when and what did the pharmacy advise?)  Preferred Pharmacy (with phone number or street name): [facility]  Agent: Please be advised that RX refills may take up to 3 business days. We ask that you follow-up with your pharmacy.

## 2018-03-22 NOTE — Telephone Encounter (Signed)
Pt needs office visit for new prescriptions from me.  Algis Greenhouse. [name], MD 03/22/2018 10:55 AM

## 2018-03-22 NOTE — Telephone Encounter (Signed)
Tried to LM 2 x. VM full per recording

## 2018-03-22 NOTE — Telephone Encounter (Signed)
See note

## 2018-03-22 NOTE — Telephone Encounter (Signed)
You have not prescribed before.  Please advise.

## 2018-03-28 ENCOUNTER — Other Ambulatory Visit: Payer: Self-pay | Admitting: Internal Medicine

## 2018-03-28 ENCOUNTER — Other Ambulatory Visit: Payer: Self-pay | Admitting: *Deleted

## 2018-03-28 ENCOUNTER — Other Ambulatory Visit: Payer: Self-pay | Admitting: Family Medicine

## 2018-03-28 DIAGNOSIS — M545 Low back pain, unspecified: Secondary | ICD-10-CM

## 2018-03-29 ENCOUNTER — Other Ambulatory Visit: Payer: Self-pay | Admitting: Family Medicine

## 2018-03-29 ENCOUNTER — Telehealth: Payer: Self-pay | Admitting: Family Medicine

## 2018-03-29 NOTE — Telephone Encounter (Unsigned)
Copied from [place] [id number]. Topic: Quick Communication - Rx Refill/Question >> Mar 29, 2018 12:33 PM [name] wrote: Medication:   traMADol (ULTRAM) 50 MG tablet  This was faxed to pharmacy but it did not go through legibly.  Please resend  Has the patient contacted their pharmacy? Yes.   (Agent: If no, request that the patient contact the pharmacy for the refill.) (Agent: If yes, when and what did the pharmacy advise?)  Preferred Pharmacy (with phone number or street name):  [facility] [place] [address] [place] [address] [place] [address] Phone: [phone] Fax: [phone]    Agent: Please be advised that RX refills may take up to 3 business days. We ask that you follow-up with your pharmacy.

## 2018-03-29 NOTE — Telephone Encounter (Signed)
Please advise 

## 2018-03-29 NOTE — Telephone Encounter (Signed)
I have faxed the prescription to patient's pharmacy.

## 2018-03-29 NOTE — Telephone Encounter (Signed)
Ok to refill but need to verify pharmacy - doesn't look like epic will let me e-prescribe.  Algis Greenhouse. [name], MD 03/29/2018 11:30 AM

## 2018-03-29 NOTE — Patient Outreach (Signed)
[place] [facility]) Care Management  03/29/2018  [name] [birth year] [id number]  Referral from [facility]; Reason;:Referral for copay assistance. Member also receives HHA but states she needs more days or hours because she can barely walk. She currently receives 8 hours per week for HHA. Her daughter helps out when she's not working."  Telephone call to patient on 5/30. She was advised of reason for call & [facility] care management services. HIPPA verification received from patient.   Patient voices her major concern is getting transportation to MD appointments. States she currently uses [employer] & daughter can take her sometimes but is not always available. States she has a "fear" of [employer] because it is sometimes late picking her up and she gets anxious wondering if she will get home.     States she gets medication without problems and takes it as ordered by her MD. States she has caregiver 8 hrs per week who cooks, cleans and does her laundry. . States she is independent in her care.    Voices some of medical conditions include high cholesterol, GERD, arthritis, gout, and anxiety. States she sees psychiatrist every 3 months.  States major concern now is getting another means of transportation other than [employer].   Patient consents to [facility] services.   Plan: Refer to Clinical Social Worker for [employer] for transportation to medical appointments. Telephonic signing off.  Sherrin Daisy, RN BSN [place] Management Coordinator [facility] Care Management  [phone]

## 2018-04-01 ENCOUNTER — Other Ambulatory Visit: Payer: Self-pay

## 2018-04-01 ENCOUNTER — Other Ambulatory Visit: Payer: Self-pay | Admitting: Licensed Clinical Social Worker

## 2018-04-01 DIAGNOSIS — M545 Low back pain, unspecified: Secondary | ICD-10-CM

## 2018-04-01 MED ORDER — ALLOPURINOL 300 MG PO TABS: 300.0000 mg | ORAL_TABLET | Freq: Every day | ORAL | 3 refills | Status: DC

## 2018-04-01 MED ORDER — TRAMADOL HCL 50 MG PO TABS: 50.0000 mg | ORAL_TABLET | Freq: Two times a day (BID) | ORAL | 0 refills | Status: DC | PRN

## 2018-04-01 MED ORDER — PANTOPRAZOLE SODIUM 40 MG PO TBEC
DELAYED_RELEASE_TABLET | ORAL | 3 refills | Status: DC
Start: 2018-04-01 — End: 2019-03-12

## 2018-04-01 MED ORDER — POTASSIUM CHLORIDE ER 10 MEQ PO CPCR: 10.0000 meq | ORAL_CAPSULE | Freq: Every day | ORAL | 2 refills | Status: DC

## 2018-04-01 NOTE — Telephone Encounter (Signed)
Ok with me. Please place any necessary orders. 

## 2018-04-01 NOTE — Patient Outreach (Signed)
  [place] [facility]) Care Management  04/01/2018  [name] [birth year] [id number]  [facility] CSW received new referral on 04/01/18 for transportation assistance. [facility] CSW screened referral. Patient is interested in gaining other transportation resources than [employer]. [facility] CSW will transfer refer to [place] to address patient's transportation needs.  [facility] CSW will sign off at this time.  Eula Fried, BSW, MSW, [email or web address] Phone: [phone] Fax: [phone]

## 2018-04-01 NOTE — Telephone Encounter (Signed)
Rx has been refaxed.

## 2018-04-01 NOTE — Telephone Encounter (Signed)
Please advise.  All were filled by patient's previous provider.  Amlodipine was stopped.  Ok to fill except for amlodipine?

## 2018-04-01 NOTE — Telephone Encounter (Signed)
See note

## 2018-04-02 ENCOUNTER — Other Ambulatory Visit: Payer: Self-pay

## 2018-04-02 NOTE — Patient Outreach (Signed)
[place] [facility]) Care Management  04/02/2018  [name] 06/23/1941 [id number]   BSW called patient in response to social work referral for transportation.  Patient reported that she has been approved for [employer] services but she is afraid to utilize it because she did not get picked up by [employer] to return home the last time she scheduled transportation.   BSW informed patient about Armed forces technical officer and mailed information/application for this service to her. BSW informed patient about [facility].  Application was submitted for this service.   BSW will follow up with patient next week to ensure receipt of [employer] information and to determine status of TAMS application.    Ronn Melena, BSW Social Worker [phone]

## 2018-04-08 ENCOUNTER — Other Ambulatory Visit: Payer: Self-pay

## 2018-04-08 NOTE — Patient Outreach (Signed)
[place] [facility]) Care Management  04/08/2018  [name] 28-Aug-1941 [id number]  BSW received voicemail message from patient regarding [employer] application that was mailed to her last week.  BSW attempted to call patient back but had to leave a voicemail message. Patient is on calendar for a follow up call before the end of the week.    Ronn Melena, BSW Social Worker [phone]

## 2018-04-10 ENCOUNTER — Other Ambulatory Visit: Payer: Self-pay

## 2018-04-10 NOTE — Patient Outreach (Signed)
[place] [facility]) Care Management  04/10/2018  [name] 1941-05-05 [id number]   BSW contacted patient to follow up on status of [employer] and [employer].  BSW informed patient that she was not approved for TAMS because this service is only available to those that are not eligible for [employer].   Patient reported that she completed the application for [employer] but mailed it back to [employer] instead of [employer] [place].  BSW agreed to mail the application to [employer] once received.   BSW will contact patient when the application has mailed.  Ronn Melena, BSW Social Worker [phone]

## 2018-04-11 ENCOUNTER — Other Ambulatory Visit: Payer: Self-pay

## 2018-04-11 NOTE — Patient Outreach (Signed)
[place]) Care Management  04/11/2018  TRINIKA CORTESE 1941/06/11 [id number]   BSW attempted to contact patient regarding referral that was forwarded from [facility], Sherrin Daisy, on 04/10/18 requesting assistance with purchasing adult diapers.  BSW left voicemail message at [phone].  There was no answer and no option to leave voicemail message at [phone].   BSW will attempt to contact patient again within four business days.  Ronn Melena, BSW Social Worker [phone]

## 2018-04-16 ENCOUNTER — Ambulatory Visit: Payer: Self-pay

## 2018-04-16 ENCOUNTER — Other Ambulatory Visit: Payer: Self-pay

## 2018-04-16 NOTE — Patient Outreach (Addendum)
[place] [facility]) Care Management  04/16/2018  [name] 1940-11-09 [id number]   BSW attempted to contact patient to inform her that Senior Wheels application was emailed to [employer] of [place] on 04/15/18 and regarding referral for assistance with purchasing adult diapers.   Patient did not answer phone and there was no option to leave voicemail message.  Unsuccessful outreach letter mailed.  BSW will attempt to contact patient again within four busines days.    Ronn Melena, BSW Social Worker [phone]

## 2018-04-18 ENCOUNTER — Ambulatory Visit: Payer: Self-pay

## 2018-04-18 ENCOUNTER — Other Ambulatory Visit: Payer: Self-pay

## 2018-04-18 ENCOUNTER — Telehealth: Payer: Self-pay | Admitting: Family Medicine

## 2018-04-18 NOTE — Telephone Encounter (Signed)
Copied from [place] [id number]. Topic: Quick Communication - See Telephone Encounter >> Apr 18, 2018  2:39 PM Bea Graff, NT wrote: CRM for notification. See Telephone encounter for: 04/18/18. Amber Chrismon a Education officer, museum with [facility] is calling on behalf of the pt to see if Dr. [name] would be willing to write a rx for this pt to get a new walker. Rolling walker. Her current walker is about 59-77 years old per pt. CB#: [phone]

## 2018-04-18 NOTE — Patient Outreach (Signed)
[facility]) Care Management  04/18/2018  [name] Mar 05, 1941 [id number]   BSW received phone call from patient in response to unsuccessful outreach letter mailed on 04/16/18.  Patient expressed multiple concerns during this conversation. Patient reported that her walker is "old and falling apart" and so it's difficult for her to safely get around the house.  BSW agreed to call Dr. Marigene Ehlers office to request an order for a new walker.  Call was made to Dr. Marigene Ehlers office and the request will be sent to him.  BSW was encouraged to wait at least 3 business days before following up on this request. Patient reported that she has home health services three times per week that assist with light housekeeping and meal preparation.  Patient says that she feels like she needs more help and it might be best for her to go "into a home".  BSW would like to discuss this further with patient's daughter, [name].  [name] was called and a voicemail message was left.   Patient talked about transportation concerns.  BSW informed patient that the application for Senior Wheels was submitted on 04/15/18.  BSW agreed to contact [employer] [place] to determine status of this application.  BSW reminded patient that, even if approved for these services, utilization is based on availability of volulnteers.  BSW encouraged patient to begin utilizing [employer] transportation again due to limited options for her and the importance of attending medical appointments.  BSW spoke with Howie Ill at [employer] who reported that patient has been approved for these services.   BSW will follow up with patient next week after connecting with her daughter.    Ronn Melena, BSW Social Worker [phone]

## 2018-04-18 NOTE — Telephone Encounter (Signed)
See note

## 2018-04-19 NOTE — Telephone Encounter (Signed)
LM for Amber with [facility] to return call.  Can write order, but need to know if family will come pick up or if they would like it faxed to a certain location.  CRM started.

## 2018-04-22 ENCOUNTER — Other Ambulatory Visit: Payer: Self-pay

## 2018-04-22 DIAGNOSIS — M1990 Unspecified osteoarthritis, unspecified site: Secondary | ICD-10-CM

## 2018-04-22 DIAGNOSIS — R262 Difficulty in walking, not elsewhere classified: Secondary | ICD-10-CM

## 2018-04-22 NOTE — Patient Outreach (Signed)
[facility]) Care Management  04/22/2018  [name] 02-25-1941 [id number]   BSW received return phone call from patient's daughter [name].   BSW informed [name] that patient was approved for [employer] and that she can start utilizing this service.  BSW reminded [name] that this service is for medical appointments only and  is run by volunteers so there is not a guarantee that it will be available every time it's needed.  Per [name], she prefers to transport her mother and is able to do it most times.  BSW mailed Policy and Procedure information for Senior Wheels to patients home.   BSW called Dr. Marigene Ehlers office regarding the status of the order for a new walker.  BSW informed [name] that the order will be written and faxed to [name] who will contact her when it is ready to be picked up.  BSW talked with [name] about the status of home health services and that the patient mentioned possibly needing a higher level of care during our last conversation.  Per [name], the in-home aide can assist with bathing but the patient "doesn't trust people" so she does not allow the aide to assist.  Per [name], she would like for her mom to remain in the home as long as she can but she acknowledged that it has been difficult to manage her care including her finances.  She reported that her brother has been planning to move in with them in July but that may no longer be happening.  She acknowledged that a higher level of care may be needed if her brother is not there to assist her in their mom's care.  BSW informed [name] that a referral can be made to a CSW if they want to explore other options.  BSW will follow up with [name] next week to make sure she has heard from [place] about the walker and to determine if she would like to explore options for a higher level of care.   [name], BSW Social Worker [phone]

## 2018-04-22 NOTE — Telephone Encounter (Signed)
Amber states OK to fax to [facility], and pt can pick up. Call if you have any questions.  Amber states just put order the daughter 's phone number to pick up

## 2018-04-23 NOTE — Telephone Encounter (Signed)
Order has been printed and signed.  Placed at front desk for pick up.  Patient's daughter has been notified via VM.

## 2018-04-23 NOTE — Telephone Encounter (Signed)
Pts daughter called and asked if the walker could be sent to [facility], but if not contact her back to come pick it up

## 2018-04-23 NOTE — Telephone Encounter (Signed)
See note

## 2018-04-23 NOTE — Telephone Encounter (Signed)
Order has been faxed to [facility].

## 2018-04-24 ENCOUNTER — Ambulatory Visit: Payer: PPO | Admitting: Adult Health

## 2018-04-25 ENCOUNTER — Other Ambulatory Visit: Payer: Self-pay | Admitting: Internal Medicine

## 2018-04-29 ENCOUNTER — Ambulatory Visit: Payer: Self-pay

## 2018-04-29 ENCOUNTER — Encounter: Payer: Self-pay | Admitting: Adult Health

## 2018-04-29 ENCOUNTER — Other Ambulatory Visit: Payer: Self-pay

## 2018-04-29 ENCOUNTER — Ambulatory Visit (INDEPENDENT_AMBULATORY_CARE_PROVIDER_SITE_OTHER): Payer: PPO | Admitting: Adult Health

## 2018-04-29 VITALS — BP 147/64 | HR 76 | Ht 62.0 in | Wt 218.0 lb

## 2018-04-29 DIAGNOSIS — R269 Unspecified abnormalities of gait and mobility: Secondary | ICD-10-CM | POA: Diagnosis not present

## 2018-04-29 DIAGNOSIS — R413 Other amnesia: Secondary | ICD-10-CM

## 2018-04-29 DIAGNOSIS — S065X9A Traumatic subdural hemorrhage with loss of consciousness of unspecified duration, initial encounter: Secondary | ICD-10-CM | POA: Diagnosis not present

## 2018-04-29 DIAGNOSIS — S065X1S Traumatic subdural hemorrhage with loss of consciousness of 30 minutes or less, sequela: Secondary | ICD-10-CM | POA: Diagnosis not present

## 2018-04-29 DIAGNOSIS — R32 Unspecified urinary incontinence: Secondary | ICD-10-CM | POA: Diagnosis not present

## 2018-04-29 DIAGNOSIS — I5032 Chronic diastolic (congestive) heart failure: Secondary | ICD-10-CM | POA: Diagnosis not present

## 2018-04-29 DIAGNOSIS — M1990 Unspecified osteoarthritis, unspecified site: Secondary | ICD-10-CM | POA: Diagnosis not present

## 2018-04-29 DIAGNOSIS — R2681 Unsteadiness on feet: Secondary | ICD-10-CM | POA: Diagnosis not present

## 2018-04-29 DIAGNOSIS — I6200 Nontraumatic subdural hemorrhage, unspecified: Secondary | ICD-10-CM | POA: Diagnosis not present

## 2018-04-29 DIAGNOSIS — N183 Chronic kidney disease, stage 3 (moderate): Secondary | ICD-10-CM | POA: Diagnosis not present

## 2018-04-29 DIAGNOSIS — M25559 Pain in unspecified hip: Secondary | ICD-10-CM | POA: Diagnosis not present

## 2018-04-29 MED ORDER — MEMANTINE HCL 28 X 5 MG & 21 X 10 MG PO TABS: ORAL_TABLET | ORAL | 0 refills | Status: DC

## 2018-04-29 NOTE — Patient Outreach (Signed)
Opened in error

## 2018-04-29 NOTE — Patient Outreach (Signed)
[place]) Care Management  04/29/2018  [name] 1941-07-16 [id number]  Follow up call to patient's daughter regarding discussion on 04/22/18.  Left voicemail message.  Will make another attempt to contact her within four business days if I do not hear back.  Ronn Melena, BSW Social Worker [phone]

## 2018-04-29 NOTE — Progress Notes (Signed)
PATIENT: [name] DOB: 09-18-1941  REASON FOR VISIT: follow up HISTORY FROM: patient  HISTORY OF PRESENT ILLNESS: Today 04/29/18:  [name] is a 77 year old female with a history of memory disturbance.  She returns today for follow-up.  At the last visit she was started on Namenda.  Her daughter reports that she did the titration pack but they were unaware that she should call back in for another prescription.  The daughter does acknowledge that she was not with her at the last office visit.  The patient feels that her memory has remained stable.  She lives at home alone.  She is able to complete all ADLs independently.  Denies any trouble sleeping.  Denies hallucinations.  Returns today for evaluation.  HISTORY [name] is a 77 year old right-handed black female with a history of a mild memory disturbance.  The patient has a chronic issue with depression, she is followed through psychiatry.  She continues to indicate that she is having a lot of problems with depression, she feels isolated and withdrawn.  She is on Wellbutrin but this does not help.  The patient has been on Aricept, she has had some problems recently with significant diarrhea and stomach cramps.  The patient otherwise is tolerating the medication fairly well.  The patient indicates that she sleeps fairly well.  The patient returns to this office for further evaluation.  The patient reports a lot of discomfort in the hips with walking, she uses a walker for ambulation.   REVIEW OF SYSTEMS: Out of a complete 14 system review of symptoms, the patient complains only of the following symptoms, and all other reviewed systems are negative.  ALLERGIES: Allergies  Allergen Reactions  . Aspirin Other (See Comments)    Bleeding from vagina   . Sulfamethoxazole-Trimethoprim Itching    HOME MEDICATIONS: Outpatient Medications Prior to Visit  Medication Sig Dispense Refill  . acetaminophen (TYLENOL) 500 MG tablet  Take 500 mg by mouth every 8 (eight) hours as needed.    [name] allopurinol (ZYLOPRIM) 300 MG tablet Take 1 tablet (300 mg total) by mouth daily. 90 tablet 3  . ascorbic acid (VITAMIN C) 500 MG tablet Take 500 mg by mouth daily.    [name] atorvastatin (LIPITOR) 40 MG tablet Take 1 tablet (40 mg total) by mouth daily. 90 tablet 3  . buPROPion (WELLBUTRIN XL) 300 MG 24 hr tablet Take 1 tablet (300 mg total) by mouth every morning. 30 tablet 2  . diclofenac sodium (VOLTAREN) 1 % GEL Apply 4 g topically 4 (four) times daily. 100 g 5  . fluticasone (FLONASE) 50 MCG/ACT nasal spray INSTILL 2 SPRAYS IN EACH NOSTRIL DAILY 16 g 11  . hydrochlorothiazide (HYDRODIURIL) 12.5 MG tablet Take 1 tablet (12.5 mg total) by mouth daily. 30 tablet 0  . hydrOXYzine (VISTARIL) 25 MG capsule Take 1 capsule (25 mg total) by mouth at bedtime. 30 capsule 2  . lactobacillus (FLORANEX/LACTINEX) PACK Take 1 packet (1 g total) by mouth 3 (three) times daily with meals. 90 each 11  . losartan (COZAAR) 100 MG tablet TAKE ONE TABLET BY MOUTH DAILY 30 tablet 11  . Multiple Vitamins-Minerals (ALIVE WOMENS ENERGY) TABS Take 1 tablet by mouth daily.     . pantoprazole (PROTONIX) 40 MG tablet TAKE 1 TABLET BY MOUTH TWICE DAILY BEFORE A MEAL 180 tablet 3  . potassium chloride (MICRO-K) 10 MEQ CR capsule Take 1 capsule (10 mEq total) by mouth daily. 90 capsule 2  . pramoxine-hydrocortisone (  PROCTOCREAM-HC) 1-1 % rectal cream Place 1 application rectally 2 (two) times daily. 30 g 0  . traMADol (ULTRAM) 50 MG tablet Take 1 tablet (50 mg total) by mouth 2 (two) times daily as needed. 180 tablet 0  . memantine (NAMENDA) 5 MG tablet Take 1 tablet daily for one week, then take 1 tablet twice daily for one week, then take 1 tablet in the morning and 2 in the evening for one week, then take 2 tablets twice daily 70 tablet 0   No facility-administered medications prior to visit.     PAST MEDICAL HISTORY: Past Medical History:  Diagnosis Date  .  Chronic venous insufficiency   . Colon, diverticulosis Sept. 2011   outpatient colonoscopy by Dr. Cristina Gong.  Need record  . DDD (degenerative disc disease), lumbosacral   . Depression   . DJD (degenerative joint disease), multiple sites    Low back pain worst  . GERD (gastroesophageal reflux disease)   . Gout   . Gout    Uric Acid level 4.2 on 300 allopurinol  . History of cervical cancer 1972  . Hx-TIA (transient ischemic attack) 05/13/2001   Right facial numbness  . Hyperlipidemia   . Hypertension   . Memory disorder 12/20/2016  . Mitral valve prolapse 10/27/1999   Subsequent 2D echo show normal mitral valve  . Osteoarthritis of hip    bilateral hips  . Ovarian cancer ([place]) 1972   S/P oophorectomy  . Psychosis ([place])   . Recurrent boils    History of MRSA skin infections with abscess  . Sensorineural hearing loss of both ears   . Subdural hematoma ([place]) 01/26/2017   bilateral    PAST SURGICAL HISTORY: Past Surgical History:  Procedure Laterality Date  . ABDOMINAL HYSTERECTOMY     1972  . BURR HOLE Bilateral 02/12/2017   Procedure: Haskell Flirt;  Surgeon: Earnie Larsson, MD;  Location: [facility];  Service: Neurosurgery;  Laterality: Bilateral;  . CERVICAL DISCECTOMY  7/07   C5-C6  . JOINT REPLACEMENT     bilateral hip replacement  . LUMBAR DISC SURGERY     L5-S1 7/07  . LUMBAR LAMINECTOMY/DECOMPRESSION MICRODISCECTOMY N/A 02/02/2016   Procedure: L4-5 Decompression;  Surgeon: Marybelle Killings, MD;  Location: [facility];  Service: Orthopedics;  Laterality: N/A;  . OOPHORECTOMY     1972 for ovarian cancer    FAMILY HISTORY: Family History  Problem Relation Age of Onset  . Diabetes Mother   . Heart disease Mother   . Hearing loss Mother   . Stroke Mother   . Post-traumatic stress disorder Father   . Schizophrenia Maternal Grandmother   . Diabetes Maternal Grandfather   . Other Brother        brother died in mental health hospital  . Kidney cancer Neg Hx   . Bladder Cancer Neg Hx      SOCIAL HISTORY: Social History   Socioeconomic History  . Marital status: Widowed    Spouse name: Not on file  . Number of children: 3  . Years of education: 74  . Highest education level: Not on file  Occupational History  . Occupation: Retired  Scientific laboratory technician  . Financial resource strain: Not on file  . Food insecurity:    Worry: Not on file    Inability: Not on file  . Transportation needs:    Medical: Not on file    Non-medical: Not on file  Tobacco Use  . Smoking status: Former Smoker  Packs/day: 0.10    Types: Cigarettes    Last attempt to quit: 04/08/2013    Years since quitting: 5.0  . Smokeless tobacco: Never Used  Substance and Sexual Activity  . Alcohol use: No    Alcohol/week: 0.0 oz  . Drug use: No  . Sexual activity: Never  Lifestyle  . Physical activity:    Days per week: Not on file    Minutes per session: Not on file  . Stress: Not on file  Relationships  . Social connections:    Talks on phone: Not on file    Gets together: Not on file    Attends religious service: Not on file    Active member of club or organization: Not on file    Attends meetings of clubs or organizations: Not on file    Relationship status: Not on file  . Intimate partner violence:    Fear of current or ex partner: Not on file    Emotionally abused: Not on file    Physically abused: Not on file    Forced sexual activity: Not on file  Other Topics Concern  . Not on file  Social History Narrative   Lives with daughter in [place]. Daughter is primary caretaker, surrogate decision-maker, and arranges for [facility] aide and other caregivers to supervise Ms. Starke at home. Ambulatory      PHYSICAL EXAM  Vitals:   04/29/18 1501  BP: (!) 147/64  Pulse: 76  Weight: 218 lb (98.9 kg)  Height: 5\' 2"  (1.575 m)   Body mass index is 39.87 kg/m.   MMSE - Mini Mental State Exam 04/29/2018 06/20/2017 01/26/2017  Orientation to time 5 3 5   Orientation to Place 5 4 5    Registration 3 2 3   Attention/ Calculation 5 0 5  Recall 2 1 3   Language- name 2 objects 2 2 2   Language- repeat 1 1 1   Language- follow 3 step command 2 3 3   Language- read & follow direction 1 1 1   Write a sentence 1 1 1   Copy design 1 1 0  Total score 28 19 29      Generalized: Well developed, in no acute distress   Neurological examination  Mentation: Alert oriented to time, place, history taking. Follows all commands speech and language fluent Cranial nerve II-XII: Pupils were equal round reactive to light. Extraocular movements were full, visual field were full on confrontational test. Facial sensation and strength were normal. Uvula tongue midline. Head turning and shoulder shrug  were normal and symmetric. Motor: The motor testing reveals 5 over 5 strength of all 4 extremities. Good symmetric motor tone is noted throughout.  Sensory: Sensory testing is intact to soft touch on all 4 extremities. No evidence of extinction is noted.  Coordination: Cerebellar testing reveals good finger-nose-finger and heel-to-shin bilaterally.  Gait and station: Patient uses a Rollator when ambulating.  Tandem gait not attempted. Reflexes: Deep tendon reflexes are symmetric and normal bilaterally.   DIAGNOSTIC DATA (LABS, IMAGING, TESTING) - I reviewed patient records, labs, notes, testing and imaging myself where available.  Lab Results  Component Value Date   WBC 7.2 03/13/2018   HGB 12.1 03/13/2018   HCT 37.6 03/13/2018   MCV 98.9 03/13/2018   PLT 203 03/13/2018      Component Value Date/Time   NA 140 03/13/2018 1930   NA 143 06/07/2017 1014   K 4.1 03/13/2018 1930   CL 109 03/13/2018 1930   CO2 23 03/13/2018 1930  GLUCOSE 91 03/13/2018 1930   BUN 21 (H) 03/13/2018 1930   BUN 26 06/07/2017 1014   CREATININE 1.32 (H) 03/13/2018 1930   CREATININE 1.34 (H) 05/19/2014 1136   CALCIUM 9.3 03/13/2018 1930   PROT 6.3 (L) 03/13/2018 1930   ALBUMIN 3.7 03/13/2018 1930   AST 20  03/13/2018 1930   ALT 20 03/13/2018 1930   ALKPHOS 106 03/13/2018 1930   BILITOT 0.4 03/13/2018 1930   GFRNONAA 38 (L) 03/13/2018 1930   GFRNONAA 40 (L) 05/19/2014 1136   GFRAA 44 (L) 03/13/2018 1930   GFRAA 46 (L) 05/19/2014 1136   Lab Results  Component Value Date   CHOL 163 08/13/2014   HDL 52 08/13/2014   LDLCALC 92 08/13/2014   TRIG 96 08/13/2014   CHOLHDL 3.1 08/13/2014   Lab Results  Component Value Date   HGBA1C 5.7 08/03/2016   Lab Results  Component Value Date   [id number] 794 12/20/2016   Lab Results  Component Value Date   TSH 3.13 07/17/2017      ASSESSMENT AND PLAN 77 y.o. year old female  has a past medical history of Chronic venous insufficiency, Colon, diverticulosis (Sept. 2011), DDD (degenerative disc disease), lumbosacral, Depression, DJD (degenerative joint disease), multiple sites, GERD (gastroesophageal reflux disease), Gout, Gout, History of cervical cancer (1972), TIA (transient ischemic attack) (05/13/2001), Hyperlipidemia, Hypertension, Memory disorder (12/20/2016), Mitral valve prolapse (10/27/1999), Osteoarthritis of hip, Ovarian cancer ([place]) (1972), Psychosis ([place]), Recurrent boils, Sensorineural hearing loss of both ears, and Subdural hematoma ([place]) (01/26/2017). here with:  1.  Memory disturbance  The patient's memory score has remained stable.  She would like to restart Namenda.  I will order a titration pack.  I advised the patient and her daughter that at the beginning of week 4 they should call for the maintenance prescription.  They voiced understanding.  She will follow-up in 6 months or sooner if needed.  I spent 15 minutes with the patient. 50% of this time was spent discussing her medication.   Ward Givens, MSN, NP-C 04/29/2018, 3:20 PM [facility] [address], [place], [place] [address] [phone]

## 2018-04-29 NOTE — Patient Instructions (Addendum)
Your Plan:  Continue to monitor memory Restart Namenda- will get a titration pack. Call at the beginning of week 4 and I will send in maintenance prescription.  If your symptoms worsen or you develop new symptoms please let us know.   Thank you for coming to see [place] at [facility]. I hope we have been able to provide you high quality care today.  You may receive a patient satisfaction survey over the next few weeks. We would appreciate your feedback and comments so that we may continue to improve ourselves and the health of our patients.

## 2018-04-29 NOTE — Progress Notes (Signed)
I have read the note, and I agree with the clinical assessment and plan.  Charles K Willis   

## 2018-04-29 NOTE — Progress Notes (Deleted)
PATIENT: [name] DOB: 1941/02/08  REASON FOR VISIT: follow up HISTORY FROM: patient  HISTORY OF PRESENT ILLNESS: Today 04/29/18  HISTORY [name] is a 77 year old right-handed black female with a history of a mild memory disturbance.  The patient has a chronic issue with depression, she is followed through psychiatry.  She continues to indicate that she is having a lot of problems with depression, she feels isolated and withdrawn.  She is on Wellbutrin but this does not help.  The patient has been on Aricept, she has had some problems recently with significant diarrhea and stomach cramps.  The patient otherwise is tolerating the medication fairly well.  The patient indicates that she sleeps fairly well.  The patient returns to this office for further evaluation.  The patient reports a lot of discomfort in the hips with walking, she uses a walker for ambulation.   REVIEW OF SYSTEMS: Out of a complete 14 system review of symptoms, the patient complains only of the following symptoms, and all other reviewed systems are negative.  ALLERGIES: Allergies  Allergen Reactions  . Aspirin Other (See Comments)    Bleeding from vagina   . Sulfamethoxazole-Trimethoprim Itching    HOME MEDICATIONS: Outpatient Medications Prior to Visit  Medication Sig Dispense Refill  . acetaminophen (TYLENOL) 500 MG tablet Take 500 mg by mouth every 8 (eight) hours as needed.    [name] allopurinol (ZYLOPRIM) 300 MG tablet Take 1 tablet (300 mg total) by mouth daily. 90 tablet 3  . ascorbic acid (VITAMIN C) 500 MG tablet Take 500 mg by mouth daily.    [name] atorvastatin (LIPITOR) 40 MG tablet Take 1 tablet (40 mg total) by mouth daily. 90 tablet 3  . buPROPion (WELLBUTRIN XL) 300 MG 24 hr tablet Take 1 tablet (300 mg total) by mouth every morning. 30 tablet 2  . diclofenac sodium (VOLTAREN) 1 % GEL Apply 4 g topically 4 (four) times daily. 100 g 5  . fluticasone (FLONASE) 50 MCG/ACT nasal spray INSTILL 2  SPRAYS IN EACH NOSTRIL DAILY 16 g 11  . hydrochlorothiazide (HYDRODIURIL) 12.5 MG tablet Take 1 tablet (12.5 mg total) by mouth daily. 30 tablet 0  . hydrOXYzine (VISTARIL) 25 MG capsule Take 1 capsule (25 mg total) by mouth at bedtime. 30 capsule 2  . lactobacillus (FLORANEX/LACTINEX) PACK Take 1 packet (1 g total) by mouth 3 (three) times daily with meals. 90 each 11  . losartan (COZAAR) 100 MG tablet TAKE ONE TABLET BY MOUTH DAILY 30 tablet 11  . memantine (NAMENDA) 5 MG tablet Take 1 tablet daily for one week, then take 1 tablet twice daily for one week, then take 1 tablet in the morning and 2 in the evening for one week, then take 2 tablets twice daily 70 tablet 0  . Multiple Vitamins-Minerals (ALIVE WOMENS ENERGY) TABS Take 1 tablet by mouth daily.     . pantoprazole (PROTONIX) 40 MG tablet TAKE 1 TABLET BY MOUTH TWICE DAILY BEFORE A MEAL 180 tablet 3  . potassium chloride (MICRO-K) 10 MEQ CR capsule Take 1 capsule (10 mEq total) by mouth daily. 90 capsule 2  . pramoxine-hydrocortisone (PROCTOCREAM-HC) 1-1 % rectal cream Place 1 application rectally 2 (two) times daily. 30 g 0  . traMADol (ULTRAM) 50 MG tablet Take 1 tablet (50 mg total) by mouth 2 (two) times daily as needed. 180 tablet 0   No facility-administered medications prior to visit.     PAST MEDICAL HISTORY: Past Medical History:  Diagnosis Date  . Chronic venous insufficiency   . Colon, diverticulosis Sept. 2011   outpatient colonoscopy by Dr. Cristina Gong.  Need record  . DDD (degenerative disc disease), lumbosacral   . Depression   . DJD (degenerative joint disease), multiple sites    Low back pain worst  . GERD (gastroesophageal reflux disease)   . Gout   . Gout    Uric Acid level 4.2 on 300 allopurinol  . History of cervical cancer 1972  . Hx-TIA (transient ischemic attack) 05/13/2001   Right facial numbness  . Hyperlipidemia   . Hypertension   . Memory disorder 12/20/2016  . Mitral valve prolapse 10/27/1999    Subsequent 2D echo show normal mitral valve  . Osteoarthritis of hip    bilateral hips  . Ovarian cancer ([place]) 1972   S/P oophorectomy  . Psychosis ([place])   . Recurrent boils    History of MRSA skin infections with abscess  . Sensorineural hearing loss of both ears   . Subdural hematoma ([place]) 01/26/2017   bilateral    PAST SURGICAL HISTORY: Past Surgical History:  Procedure Laterality Date  . ABDOMINAL HYSTERECTOMY     1972  . BURR HOLE Bilateral 02/12/2017   Procedure: Haskell Flirt;  Surgeon: Earnie Larsson, MD;  Location: [place];  Service: Neurosurgery;  Laterality: Bilateral;  . CERVICAL DISCECTOMY  7/07   C5-C6  . JOINT REPLACEMENT     bilateral hip replacement  . LUMBAR DISC SURGERY     L5-S1 7/07  . LUMBAR LAMINECTOMY/DECOMPRESSION MICRODISCECTOMY N/A 02/02/2016   Procedure: L4-5 Decompression;  Surgeon: Marybelle Killings, MD;  Location: [facility];  Service: Orthopedics;  Laterality: N/A;  . OOPHORECTOMY     1972 for ovarian cancer    FAMILY HISTORY: Family History  Problem Relation Age of Onset  . Diabetes Mother   . Heart disease Mother   . Hearing loss Mother   . Stroke Mother   . Post-traumatic stress disorder Father   . Schizophrenia Maternal Grandmother   . Diabetes Maternal Grandfather   . Other Brother        brother died in mental health [facility]  . Kidney cancer Neg Hx   . Bladder Cancer Neg Hx     SOCIAL HISTORY: Social History   Socioeconomic History  . Marital status: Widowed    Spouse name: Not on file  . Number of children: 3  . Years of education: 51  . Highest education level: Not on file  Occupational History  . Occupation: Retired  Scientific laboratory technician  . Financial resource strain: Not on file  . Food insecurity:    Worry: Not on file    Inability: Not on file  . Transportation needs:    Medical: Not on file    Non-medical: Not on file  Tobacco Use  . Smoking status: Former Smoker    Packs/day: 0.10    Types: Cigarettes    Last attempt to quit:  04/08/2013    Years since quitting: 5.0  . Smokeless tobacco: Never Used  Substance and Sexual Activity  . Alcohol use: No    Alcohol/week: 0.0 oz  . Drug use: No  . Sexual activity: Never  Lifestyle  . Physical activity:    Days per week: Not on file    Minutes per session: Not on file  . Stress: Not on file  Relationships  . Social connections:    Talks on phone: Not on file    Gets together: Not on  file    Attends religious service: Not on file    Active member of club or organization: Not on file    Attends meetings of clubs or organizations: Not on file    Relationship status: Not on file  . Intimate partner violence:    Fear of current or ex partner: Not on file    Emotionally abused: Not on file    Physically abused: Not on file    Forced sexual activity: Not on file  Other Topics Concern  . Not on file  Social History Narrative   Lives with daughter in [place]. Daughter is primary caretaker, surrogate decision-maker, and arranges for [facility] aide and other caregivers to supervise Ms. Ruta at home. Ambulatory      PHYSICAL EXAM  Vitals:   04/29/18 1501  BP: (!) 147/64  Pulse: 76  Weight: 218 lb (98.9 kg)  Height: 5\' 2"  (1.575 m)   Body mass index is 39.87 kg/m.   MMSE - Mini Mental State Exam 04/29/2018 06/20/2017 01/26/2017  Orientation to time 5 3 5   Orientation to Place 5 4 5   Registration 3 2 3   Attention/ Calculation 5 0 5  Recall 2 1 3   Language- name 2 objects 2 2 2   Language- repeat 1 1 1   Language- follow 3 step command 2 3 3   Language- read & follow direction 1 1 1   Write a sentence 1 1 1   Copy design 1 1 0  Total score 28 19 29      Generalized: Well developed, in no acute distress   Neurological examination  Mentation: Alert oriented to time, place, history taking. Follows all commands speech and language fluent Cranial nerve II-XII: Pupils were equal round reactive to light. Extraocular movements were full, visual field were full on  confrontational test. Facial sensation and strength were normal. Uvula tongue midline. Head turning and shoulder shrug  were normal and symmetric. Motor: The motor testing reveals 5 over 5 strength of all 4 extremities. Good symmetric motor tone is noted throughout.  Sensory: Sensory testing is intact to soft touch on all 4 extremities. No evidence of extinction is noted.  Coordination: Cerebellar testing reveals good finger-nose-finger and heel-to-shin bilaterally.  Gait and station: Gait is normal. Tandem gait is normal. Romberg is negative. No drift is seen.  Reflexes: Deep tendon reflexes are symmetric and normal bilaterally.   DIAGNOSTIC DATA (LABS, IMAGING, TESTING) - I reviewed patient records, labs, notes, testing and imaging myself where available.  Lab Results  Component Value Date   WBC 7.2 03/13/2018   HGB 12.1 03/13/2018   HCT 37.6 03/13/2018   MCV 98.9 03/13/2018   PLT 203 03/13/2018      Component Value Date/Time   NA 140 03/13/2018 1930   NA 143 06/07/2017 1014   K 4.1 03/13/2018 1930   CL 109 03/13/2018 1930   CO2 23 03/13/2018 1930   GLUCOSE 91 03/13/2018 1930   BUN 21 (H) 03/13/2018 1930   BUN 26 06/07/2017 1014   CREATININE 1.32 (H) 03/13/2018 1930   CREATININE 1.34 (H) 05/19/2014 1136   CALCIUM 9.3 03/13/2018 1930   PROT 6.3 (L) 03/13/2018 1930   ALBUMIN 3.7 03/13/2018 1930   AST 20 03/13/2018 1930   ALT 20 03/13/2018 1930   ALKPHOS 106 03/13/2018 1930   BILITOT 0.4 03/13/2018 1930   GFRNONAA 38 (L) 03/13/2018 1930   GFRNONAA 40 (L) 05/19/2014 1136   GFRAA 44 (L) 03/13/2018 1930   GFRAA 46 (L) 05/19/2014  1136   Lab Results  Component Value Date   CHOL 163 08/13/2014   HDL 52 08/13/2014   LDLCALC 92 08/13/2014   TRIG 96 08/13/2014   CHOLHDL 3.1 08/13/2014   Lab Results  Component Value Date   HGBA1C 5.7 08/03/2016   Lab Results  Component Value Date   [id number] 572 12/20/2016   Lab Results  Component Value Date   TSH 3.13 07/17/2017       ASSESSMENT AND PLAN 77 y.o. year old female  has a past medical history of Chronic venous insufficiency, Colon, diverticulosis (Sept. 2011), DDD (degenerative disc disease), lumbosacral, Depression, DJD (degenerative joint disease), multiple sites, GERD (gastroesophageal reflux disease), Gout, Gout, History of cervical cancer (1972), TIA (transient ischemic attack) (05/13/2001), Hyperlipidemia, Hypertension, Memory disorder (12/20/2016), Mitral valve prolapse (10/27/1999), Osteoarthritis of hip, Ovarian cancer ([place]) (1972), Psychosis ([place]), Recurrent boils, Sensorineural hearing loss of both ears, and Subdural hematoma ([place]) (01/26/2017). here with ***   I spent 15 minutes with the patient. 50% of this time was spent   Ward Givens, MSN, NP-C 04/29/2018, 11:58 AM [facility] [address], [place], [place] [address] [phone]

## 2018-04-30 ENCOUNTER — Encounter: Payer: Self-pay | Admitting: Family Medicine

## 2018-04-30 ENCOUNTER — Ambulatory Visit (INDEPENDENT_AMBULATORY_CARE_PROVIDER_SITE_OTHER): Payer: PPO | Admitting: Family Medicine

## 2018-04-30 VITALS — BP 126/70 | HR 72 | Temp 98.8°F | Ht 63.0 in | Wt 217.4 lb

## 2018-04-30 DIAGNOSIS — E785 Hyperlipidemia, unspecified: Secondary | ICD-10-CM | POA: Diagnosis not present

## 2018-04-30 DIAGNOSIS — R739 Hyperglycemia, unspecified: Secondary | ICD-10-CM

## 2018-04-30 DIAGNOSIS — M1990 Unspecified osteoarthritis, unspecified site: Secondary | ICD-10-CM | POA: Diagnosis not present

## 2018-04-30 DIAGNOSIS — I10 Essential (primary) hypertension: Secondary | ICD-10-CM

## 2018-04-30 DIAGNOSIS — K5900 Constipation, unspecified: Secondary | ICD-10-CM | POA: Diagnosis not present

## 2018-04-30 MED ORDER — AMLODIPINE BESYLATE 10 MG PO TABS: 10.0000 mg | ORAL_TABLET | ORAL | 3 refills | Status: DC

## 2018-04-30 NOTE — Assessment & Plan Note (Signed)
Check lipid panel today.  Continue Lipitor 40 mg daily. 

## 2018-04-30 NOTE — Assessment & Plan Note (Signed)
Stable.  Continue tramadol as needed.  Will place referral to physical therapy.  If symptoms worsen, will need referral to orthopedics or sports medicine.

## 2018-04-30 NOTE — Assessment & Plan Note (Signed)
At goal today.  Refilled her amlodipine.  Continue HCTZ 12.5 mg daily and losartan 100 mg daily.

## 2018-04-30 NOTE — Patient Instructions (Signed)
It was very nice to see you today!  I will refill your amlodipine today.  We will continue all your other medications.  Please use MiraLAX as needed for constipation.  You should  have a soft bowel movement once daily or every other day.  Please continue your fiber supplement and probiotic.  I will place referral to physical therapy today.  We will check blood work today also.  Take care, Dr [name]

## 2018-04-30 NOTE — Progress Notes (Signed)
   Subjective:  [name] is a 77 y.o. female who presents today with a chief complaint of osteoarthritis follow up.   HPI:  Osteoarthritis, chronic problem, stable Takes tramadol 50 mg twice daily which helps with her symptoms.  Also occasionally uses Tylenol and topical Voltaren gel.  Symptoms are stable since her last visit.  She has been referred to physical therapy in the past, but has not followed up with them yet.  HTN, chronic problem, stable Current regimen includes amlodipine 10 mg every other day, HCTZ 12.5 mg daily, and losartan 100 mg daily.  Does well on this regimen without reported side effects.No reported chest pain or shortness of breath.   Constipation, chronic problem, stable Patient had an episode of severe constipation several weeks ago and took several laxatives which subsequently caused her to have severe diarrhea and a flareup of her hemorrhoids.  Symptoms have since resolved.  She ran out of Benefiber earlier this week.  She has been taking her probiotic and doing well.  Has a soft bowel movement daily.  ROS: Per HPI  PMH: She reports that she quit smoking about 5 years ago. Her smoking use included cigarettes. She smoked 0.10 packs per day. She has never used smokeless tobacco. She reports that she does not drink alcohol or use drugs.  Objective:  Physical Exam: BP 126/70 (BP Location: Right Arm, Patient Position: Sitting, Cuff Size: Normal)   Pulse 72   Temp 98.8 F (37.1 C) (Oral)   Ht 5\' 3"  (1.6 m)   Wt 217 lb 6.4 oz (98.6 kg)   SpO2 97%   BMI 38.51 kg/m   Gen: NAD, resting comfortably CV: RRR with no murmurs appreciated Pulm: NWOB, CTAB with no crackles, wheezes, or rhonchi MSK: 2+ pitting edema bilaterally  Assessment/Plan:  Osteoarthritis Stable.  Continue tramadol as needed.  Will place referral to physical therapy.  If symptoms worsen, will need referral to orthopedics or sports medicine.  Essential hypertension At goal today.   Refilled her amlodipine.  Continue HCTZ 12.5 mg daily and losartan 100 mg daily.  Dyslipidemia Check lipid panel today.  Continue Lipitor 40 mg daily.  Constipation Discussed bowel regimen with patient.  Encouraged good oral hydration.  Recommended MiraLAX as needed.  Also recommended continued use of probiotic and fiber supplement.  Hyperglycemia Check A1c today.  Algis Greenhouse. [name], MD 04/30/2018 3:48 PM

## 2018-04-30 NOTE — Assessment & Plan Note (Signed)
Discussed bowel regimen with patient.  Encouraged good oral hydration.  Recommended MiraLAX as needed.  Also recommended continued use of probiotic and fiber supplement.

## 2018-05-01 ENCOUNTER — Other Ambulatory Visit: Payer: Self-pay

## 2018-05-01 ENCOUNTER — Telehealth: Payer: Self-pay | Admitting: Family Medicine

## 2018-05-01 DIAGNOSIS — M1990 Unspecified osteoarthritis, unspecified site: Secondary | ICD-10-CM

## 2018-05-01 LAB — COMPREHENSIVE METABOLIC PANEL
ALT: 17 U/L (ref 0–35)
AST: 15 U/L (ref 0–37)
Albumin: 4.1 g/dL (ref 3.5–5.2)
Alkaline Phosphatase: 123 U/L — ABNORMAL HIGH (ref 39–117)
BUN: 17 mg/dL (ref 6–23)
CO2: 25 mEq/L (ref 19–32)
Calcium: 9.9 mg/dL (ref 8.4–10.5)
Chloride: 108 mEq/L (ref 96–112)
Creatinine, Ser: 1.23 mg/dL — ABNORMAL HIGH (ref 0.40–1.20)
GFR: 54.49 mL/min — ABNORMAL LOW (ref >60.00)
Glucose, Bld: 91 mg/dL (ref 70–99)
Potassium: 4.6 mEq/L (ref 3.5–5.1)
Sodium: 142 mEq/L (ref 135–145)
Total Bilirubin: 0.5 mg/dL (ref 0.2–1.2)
Total Protein: 6.5 g/dL (ref 6.0–8.3)

## 2018-05-01 LAB — CBC
HCT: 35.7 % — ABNORMAL LOW (ref 36.0–46.0)
Hemoglobin: 11.8 g/dL — ABNORMAL LOW (ref 12.0–15.0)
MCHC: 33.2 g/dL (ref 30.0–36.0)
MCV: 98.3 fl (ref 78.0–100.0)
Platelets: 192 10*3/uL (ref 150.0–400.0)
RBC: 3.63 Mil/uL — ABNORMAL LOW (ref 3.87–5.11)
RDW: 15.4 % (ref 11.5–15.5)
WBC: 6.5 10*3/uL (ref 4.0–10.5)

## 2018-05-01 LAB — LIPID PANEL
Cholesterol: 201 mg/dL — ABNORMAL HIGH (ref 0–200)
HDL: 50.5 mg/dL (ref >39.00)
LDL Cholesterol: 127 mg/dL — ABNORMAL HIGH (ref 0–99)
NonHDL: 150.48
Total CHOL/HDL Ratio: 4
Triglycerides: 118 mg/dL (ref 0.0–149.0)
VLDL: 23.6 mg/dL (ref 0.0–40.0)

## 2018-05-01 LAB — HEMOGLOBIN A1C: Hgb A1c MFr Bld: 6 % (ref 4.6–6.5)

## 2018-05-01 LAB — TSH: TSH: 2.46 u[IU]/mL (ref 0.35–4.50)

## 2018-05-01 NOTE — Telephone Encounter (Signed)
New order for physical therapy has been placed.

## 2018-05-01 NOTE — Telephone Encounter (Signed)
Called the patient to set up physical therapy appointment. The patient refused. She would like a location with pool therapy. She says she's had it done before and doing therapy in the pool works better.

## 2018-05-03 ENCOUNTER — Other Ambulatory Visit: Payer: Self-pay

## 2018-05-03 ENCOUNTER — Ambulatory Visit: Payer: Self-pay

## 2018-05-03 NOTE — Patient Outreach (Signed)
[place] [facility]) Care Management  05/03/2018  [name] 02-10-1941 [id number]    Second follow up call to patient's daughter regarding discussion on 04/22/18.  Left voicemail message.  Will make another attempt to contact her within four business days if I do not hear back.  Ronn Melena, BSW Social Worker [phone]

## 2018-05-03 NOTE — Patient Outreach (Signed)
[place] [facility]) Care Management  05/03/2018  [name] 1941/06/22 [id number]   BSW received return phone call from patient's daugther, [name].  [name] confirmed that Ms. Valliant received the new walker that was requested.  We revisited the conversation about the potential need for a higher level of care.  [name] confirmed that her brother is going to be moving in with [name] some time this month so they do not feel there is a need for a higher level of care at this point.  We also talked about the patient's request for assistance with adult diapers.  BSW informed [name] that [employer] will not pay for these products so paying out of pocket is the only option.  [name] reported that she was aware of this and has communicated it to [name] several times.  BSW is closing case at this time due to no other social work needs identified.  BSW encouraged [name] to call if other needs arise.    Ronn [name], BSW Social Worker [phone]

## 2018-05-06 ENCOUNTER — Other Ambulatory Visit: Payer: Self-pay

## 2018-05-06 ENCOUNTER — Encounter: Payer: Self-pay | Admitting: Family Medicine

## 2018-05-06 DIAGNOSIS — R739 Hyperglycemia, unspecified: Secondary | ICD-10-CM | POA: Insufficient documentation

## 2018-05-06 NOTE — Patient Outreach (Signed)
[place] [facility]) Care Management  05/06/2018  [name] 1941-09-29 [id number]   BSW received phone call from [name] on 05/04/18 regarding transportation to an upcoming dermatology appointment. Per [name], she arranged transportation through [employer] but had not heard back with confirmation.  BSW called [employer] today and they confirmed that she has been assigned a driver who should be contacting her today regarding pick up time.  BSW attempted to call [name] back to relay this but she did not answer and there was no option to leave a message.  BSW left voicemail message with her daughter, [name].  Ronn Melena, BSW Social Worker [phone]

## 2018-05-08 ENCOUNTER — Ambulatory Visit: Payer: Self-pay

## 2018-05-20 ENCOUNTER — Ambulatory Visit: Payer: Self-pay | Admitting: Family Medicine

## 2018-05-20 ENCOUNTER — Telehealth: Payer: Self-pay | Admitting: Adult Health

## 2018-05-20 NOTE — Telephone Encounter (Signed)
Called, LVM for daughter letting her know if sx do not improve to contact us or PCP to f/u. Gave [facility] phone number for further questions/concerns.

## 2018-05-20 NOTE — Telephone Encounter (Signed)
Noted. If symptoms do not improve she should let us or PCP know.

## 2018-05-20 NOTE — Telephone Encounter (Signed)
Jinny Blossom- would you like to do anything different?

## 2018-05-20 NOTE — Telephone Encounter (Signed)
Pts daughter [name] called stating that the pt has been taken Namenda for about 2 weeks now and has been experiencing confusion, dizziness, and insomnia. Stating pt stopped medication on Saturday 7/20

## 2018-05-21 NOTE — Telephone Encounter (Signed)
Patient checking status of referral? Thought she has going to have home health PT. Please advise [phone]

## 2018-05-23 ENCOUNTER — Other Ambulatory Visit: Payer: Self-pay | Admitting: Internal Medicine

## 2018-06-06 ENCOUNTER — Ambulatory Visit (HOSPITAL_COMMUNITY): Payer: Self-pay | Admitting: Psychiatry

## 2018-06-11 ENCOUNTER — Ambulatory Visit: Payer: Self-pay | Admitting: Family Medicine

## 2018-06-12 DIAGNOSIS — M25561 Pain in right knee: Secondary | ICD-10-CM | POA: Diagnosis not present

## 2018-06-12 DIAGNOSIS — R296 Repeated falls: Secondary | ICD-10-CM | POA: Diagnosis not present

## 2018-06-12 DIAGNOSIS — R531 Weakness: Secondary | ICD-10-CM | POA: Diagnosis not present

## 2018-06-12 DIAGNOSIS — G8929 Other chronic pain: Secondary | ICD-10-CM | POA: Diagnosis not present

## 2018-06-12 DIAGNOSIS — M545 Low back pain: Secondary | ICD-10-CM | POA: Diagnosis not present

## 2018-06-12 DIAGNOSIS — M25562 Pain in left knee: Secondary | ICD-10-CM | POA: Diagnosis not present

## 2018-06-13 ENCOUNTER — Telehealth: Payer: Self-pay | Admitting: Family Medicine

## 2018-06-13 ENCOUNTER — Other Ambulatory Visit: Payer: Self-pay

## 2018-06-13 DIAGNOSIS — M79672 Pain in left foot: Secondary | ICD-10-CM

## 2018-06-13 DIAGNOSIS — R262 Difficulty in walking, not elsewhere classified: Secondary | ICD-10-CM

## 2018-06-13 DIAGNOSIS — M79671 Pain in right foot: Secondary | ICD-10-CM

## 2018-06-13 NOTE — Telephone Encounter (Signed)
Referral has been placed. 

## 2018-06-13 NOTE — Telephone Encounter (Signed)
Copied from [place] [phone]. Topic: Referral - Request >> Jun 13, 2018 11:51 AM Yvette Rack wrote: Reason for CRM: pt calling wanting a referral to the [facility] [phone] on [address] she states that her insurance advised her to ask her provider to send her there for her feet she can barely walk and that it has a pool for her feet she states that if she get a referral there her co-pay wouldn't be $30 a day

## 2018-06-14 ENCOUNTER — Other Ambulatory Visit: Payer: Self-pay

## 2018-06-14 ENCOUNTER — Other Ambulatory Visit: Payer: Self-pay | Admitting: Family Medicine

## 2018-06-14 NOTE — Patient Outreach (Signed)
[facility] [facility]) Care Management  06/14/2018  [name] 04/19/1941 [id number]   Referral Date: 06/13/18 Referral Source: [facility] Concierge Referral Reason: Need transportation assistance and new case worker   Outreach Attempt: No answer.  HIPAA compliant voice message left.   Plan: RN CM will send letter and attempt patient again within 4 business days.   Jone Baseman, RN, MSN [facility] Care Management Care Management Coordinator Direct Line [phone] Toll Free: [phone]  Fax: [phone]

## 2018-06-18 ENCOUNTER — Other Ambulatory Visit: Payer: Self-pay

## 2018-06-18 NOTE — Patient Outreach (Signed)
[place] [facility]) Care Management  06/18/2018  [name] 1941/06/30 [id number]   Referral Date: 06/13/18 Referral Source: [facility] Concierge Referral Reason: Need transportation assistance and new case worker   Outreach Attempt: No answer.  HIPAA compliant voice message left.    Plan: RN CM will attempt patient again within 4 business days.    Jone Baseman, RN, MSN [place] Management Care Management Coordinator Direct Line [phone] Cell [phone] Toll Free: [phone]  Fax: [phone]

## 2018-06-19 ENCOUNTER — Other Ambulatory Visit (HOSPITAL_COMMUNITY): Payer: Self-pay | Admitting: Psychiatry

## 2018-06-19 ENCOUNTER — Other Ambulatory Visit: Payer: Self-pay

## 2018-06-19 ENCOUNTER — Other Ambulatory Visit: Payer: Self-pay | Admitting: Family Medicine

## 2018-06-19 DIAGNOSIS — F200 Paranoid schizophrenia: Secondary | ICD-10-CM

## 2018-06-19 NOTE — Patient Outreach (Signed)
[place] [facility]) Care Management  06/19/2018  [name] 1941-08-14 [id number]   Referral Date:06/13/18 Referral Source:[employer]A Concierge Referral Reason:Need transportation assistance and new case worker  Telephone call to patient for referral request.  Patient states that she was looking for other transportation resources.  Advised patient that [place] recently has given all the transportation resources available to patient in the area.  Patient verbalized understanding but just wanted any other resources.  Reiterated with patient that she had been given all the transportation resources available at this time.  Patient offered no further concerns.     Plan: RN CM will close case.  Jone Baseman, RN, MSN [place] Management Care Management Coordinator Direct Line [phone] Cell [phone] Toll Free: [phone]  Fax: [phone]

## 2018-06-20 ENCOUNTER — Ambulatory Visit: Payer: Self-pay

## 2018-06-20 DIAGNOSIS — G8929 Other chronic pain: Secondary | ICD-10-CM | POA: Diagnosis not present

## 2018-06-20 DIAGNOSIS — M25561 Pain in right knee: Secondary | ICD-10-CM | POA: Diagnosis not present

## 2018-06-20 DIAGNOSIS — R531 Weakness: Secondary | ICD-10-CM | POA: Diagnosis not present

## 2018-06-20 DIAGNOSIS — R296 Repeated falls: Secondary | ICD-10-CM | POA: Diagnosis not present

## 2018-06-20 DIAGNOSIS — M545 Low back pain: Secondary | ICD-10-CM | POA: Diagnosis not present

## 2018-06-20 DIAGNOSIS — M25562 Pain in left knee: Secondary | ICD-10-CM | POA: Diagnosis not present

## 2018-06-21 ENCOUNTER — Telehealth: Payer: Self-pay | Admitting: Family Medicine

## 2018-06-21 NOTE — Telephone Encounter (Signed)
Patient has been referred to [facility], which is the location she requested.  The referral has been faxed.  She will need to contact her insurance company about any co-pay issues as we do not have any control over that.

## 2018-06-21 NOTE — Telephone Encounter (Signed)
Copied from [place] [phone]. Topic: Quick Communication - See Telephone Encounter >> Jun 21, 2018  2:29 PM Burchel, Abbi R wrote: CRM for notification. See Telephone encounter for: 06/21/18.  Pt states that PT is helping, but she cannot afford a $30 co-pay each time she goes.  Please advise.   Pt:  [phone]

## 2018-07-16 ENCOUNTER — Other Ambulatory Visit (HOSPITAL_COMMUNITY): Payer: Self-pay | Admitting: Family Medicine

## 2018-07-16 DIAGNOSIS — F200 Paranoid schizophrenia: Secondary | ICD-10-CM

## 2018-07-18 ENCOUNTER — Other Ambulatory Visit: Payer: Self-pay | Admitting: Family Medicine

## 2018-07-18 DIAGNOSIS — M545 Low back pain, unspecified: Secondary | ICD-10-CM

## 2018-07-22 ENCOUNTER — Other Ambulatory Visit (HOSPITAL_COMMUNITY): Payer: Self-pay

## 2018-07-22 DIAGNOSIS — F200 Paranoid schizophrenia: Secondary | ICD-10-CM

## 2018-07-22 MED ORDER — HYDROXYZINE PAMOATE 25 MG PO CAPS: 25.0000 mg | ORAL_CAPSULE | Freq: Every day | ORAL | 1 refills | Status: DC

## 2018-07-22 MED ORDER — BUPROPION HCL ER (XL) 300 MG PO TB24: 300.0000 mg | ORAL_TABLET | Freq: Every morning | ORAL | 1 refills | Status: DC

## 2018-07-23 ENCOUNTER — Other Ambulatory Visit: Payer: Self-pay | Admitting: Family Medicine

## 2018-07-23 DIAGNOSIS — M545 Low back pain, unspecified: Secondary | ICD-10-CM

## 2018-07-23 NOTE — Progress Notes (Signed)
Rx printed and signed.  Her Pharmacy is not able to accept e-prescriptions.   Algis Greenhouse. [name], MD 07/23/2018 8:06 AM

## 2018-07-23 NOTE — Progress Notes (Signed)
Rx faxed to patient's pharmacy.

## 2018-07-25 ENCOUNTER — Other Ambulatory Visit: Payer: Self-pay | Admitting: Family Medicine

## 2018-07-25 DIAGNOSIS — Z1231 Encounter for screening mammogram for malignant neoplasm of breast: Secondary | ICD-10-CM

## 2018-07-31 NOTE — Progress Notes (Signed)
Subjective:   [name] is a 77 y.o. female who presents for Medicare Annual (Subsequent) preventive examination.  Reports health as challenged  The patient is seeing Dr. [name] at [facility] son created suicide 2013/02/02  2 boys living dtr with her today  [place] children 3 grands Lives with Herbie [place] her son  Diet Chol/hdl 4; hdl 50;  A1c 6.0  Doesn't feel like eating  Children bring her food ; Popcorn Breakfast frosted flakes and SB Pancakes Children brings food   Exercise Go to the grocery store  PT ordered but has not been  dtr took her and didn't want to stick with it  States she falls- grabs hold to the walker Went to the pool x 1 and now want go  Lives in fear  Dtr states she will not do PT and the patient declines further PT today   She quit smoking a long time ago and picked up briefly x 1 week when son died. She quit many years ago     Health Maintenance Due  Topic Date Due  . MAMMOGRAM  07/06/2018      Mammogram 06/2017; due soon; dtr may schedule with she has hers States she is afraid of falling if she lets go of her walker and that is why she does not want to have another mammogram   dexa 11/2006 - declines  Had flu vaccine today  Educated regarding shingrix and Tetanus; (tdap )      Objective:     Vitals: BP 132/60   Pulse 82   Ht 5\' 2"  (1.575 m)   Wt 217 lb (98.4 kg)   SpO2 95%   BMI 39.69 kg/m   Body mass index is 39.69 kg/m.  Advanced Directives 08/01/2018 08/30/2017 08/07/2017 08/06/2017 06/07/2017 04/06/2017 04/06/2017  Does Patient Have a Medical Advance Directive? Yes No No No Yes Yes -  Type of Advance Directive - - - - Press photographer;Living will Healthcare Power of [place]  Does patient want to make changes to medical advance directive? - - - - No - Patient declined No - Patient declined No - Patient declined  Copy of Healthcare Power of Attorney in Chart? - - - - Yes No - copy requested No -  copy requested  Would patient like information on creating a medical advance directive? - - No - Patient declined Yes (MAU/Ambulatory/Procedural Areas - Information given) - No - Patient declined No - Patient declined   Dtr is her HCPOA in the [place]   Tobacco Social History   Tobacco Use  Smoking Status Former Smoker  . Packs/day: 0.10  . Years: 35.00  . Pack years: 3.50  . Types: Cigarettes  . Last attempt to quit: 04/08/2013  . Years since quitting: 5.3  Smokeless Tobacco Never Used  Tobacco Comment   smoked a few when her son died in 2013/02/02      Counseling given: Yes Comment: smoked a few when her son died in 02-02-2013    Clinical Intake:   Past Medical History:  Diagnosis Date  . Chronic venous insufficiency   . Colon, diverticulosis Sept. 2011   outpatient colonoscopy by Dr. Cristina Gong.  Need record  . DDD (degenerative disc disease), lumbosacral   . Depression   . DJD (degenerative joint disease), multiple sites    Low back pain worst  . GERD (gastroesophageal reflux disease)   . Gout   . Gout    Uric Acid  level 4.2 on 300 allopurinol  . History of cervical cancer 02-18-1971  . Hx-TIA (transient ischemic attack) 05/13/2001   Right facial numbness  . Hyperlipidemia   . Hypertension   . Memory disorder 12/20/2016  . Mitral valve prolapse 10/27/1999   Subsequent 2D echo show normal mitral valve  . Osteoarthritis of hip    bilateral hips  . Ovarian cancer ([place]) 02-18-71   S/P oophorectomy  . Psychosis ([place])   . Recurrent boils    History of MRSA skin infections with abscess  . Sensorineural hearing loss of both ears   . Subdural hematoma ([place]) 01/26/2017   bilateral   Past Surgical History:  Procedure Laterality Date  . ABDOMINAL HYSTERECTOMY     1972  . BURR HOLE Bilateral 02/12/2017   Procedure: Haskell Flirt;  Surgeon: Earnie Larsson, MD;  Location: [facility];  Service: Neurosurgery;  Laterality: Bilateral;  . CERVICAL DISCECTOMY  7/07   C5-C6  . JOINT REPLACEMENT      bilateral hip replacement  . LUMBAR DISC SURGERY     L5-S1 7/07  . LUMBAR LAMINECTOMY/DECOMPRESSION MICRODISCECTOMY N/A 02/02/2016   Procedure: L4-5 Decompression;  Surgeon: Marybelle Killings, MD;  Location: [facility];  Service: Orthopedics;  Laterality: N/A;  . [place] for ovarian cancer   Family History  Problem Relation Age of Onset  . Diabetes Mother   . Heart disease Mother   . Hearing loss Mother   . Stroke Mother   . Post-traumatic stress disorder Father   . Schizophrenia Maternal Grandmother   . Diabetes Maternal Grandfather   . Other Brother        brother died in mental health hospital  . Kidney cancer Neg Hx   . Bladder Cancer Neg Hx    Social History   Socioeconomic History  . Marital status: Widowed    Spouse name: Not on file  . Number of children: 3  . Years of education: 10  . Highest education level: Not on file  Occupational History  . Occupation: Retired  Scientific laboratory technician  . Financial resource strain: Not on file  . Food insecurity:    Worry: Not on file    Inability: Not on file  . Transportation needs:    Medical: Not on file    Non-medical: Not on file  Tobacco Use  . Smoking status: Former Smoker    Packs/day: 0.10    Years: 35.00    Pack years: 3.50    Types: Cigarettes    Last attempt to quit: 04/08/2013    Years since quitting: 5.3  . Smokeless tobacco: Never Used  . Tobacco comment: smoked a few when her son died in 2013/02/17   Substance and Sexual Activity  . Alcohol use: No    Alcohol/week: 0.0 standard drinks  . Drug use: No  . Sexual activity: Never  Lifestyle  . Physical activity:    Days per week: Not on file    Minutes per session: Not on file  . Stress: Not on file  Relationships  . Social connections:    Talks on phone: Not on file    Gets together: Not on file    Attends religious service: Not on file    Active member of club or organization: Not on file    Attends meetings of clubs or organizations: Not on file     Relationship status: Not on file  Other Topics Concern  . Not on file  Social History  Narrative   Lives with daughter in [place]. Daughter is primary caretaker, surrogate decision-maker, and arranges for [facility] aide and other caregivers to supervise Ms. Cerreta at home. Ambulatory    Outpatient Encounter Medications as of 08/01/2018  Medication Sig  . acetaminophen (TYLENOL) 500 MG tablet Take 500 mg by mouth every 8 (eight) hours as needed.  [name] allopurinol (ZYLOPRIM) 300 MG tablet Take 1 tablet (300 mg total) by mouth daily.  [name] amLODipine (NORVASC) 10 MG tablet Take 1 tablet (10 mg total) by mouth every other day.  [name] ascorbic acid (VITAMIN C) 500 MG tablet Take 500 mg by mouth daily.  [name] atorvastatin (LIPITOR) 40 MG tablet TAKE 1 TABLET BY MOUTH EVERY DAY  . buPROPion (WELLBUTRIN XL) 300 MG 24 hr tablet Take 1 tablet (300 mg total) by mouth every morning.  . diclofenac sodium (VOLTAREN) 1 % GEL Apply 4 g topically 4 (four) times daily.  . fluticasone (FLONASE) 50 MCG/ACT nasal spray INSTILL 2 SPRAYS IN EACH NOSTRIL DAILY  . hydrochlorothiazide (HYDRODIURIL) 12.5 MG tablet Take 1 tablet (12.5 mg total) by mouth daily.  . hydrochlorothiazide (HYDRODIURIL) 12.5 MG tablet TAKE ONE TABLET BY MOUTH EVERY DAY  . hydrOXYzine (VISTARIL) 25 MG capsule Take 1 capsule (25 mg total) by mouth at bedtime.  . lactobacillus (FLORANEX/LACTINEX) PACK Take 1 packet (1 g total) by mouth 3 (three) times daily with meals.  [name] losartan (COZAAR) 100 MG tablet TAKE ONE TABLET BY MOUTH DAILY  . memantine (NAMENDA TITRATION PAK) tablet pack 5 mg/day for =1 week; 5 mg twice daily for =1 week; 15 mg/day given in 5 mg and 10 mg separated doses for =1 week; then 10 mg twice daily  . Multiple Vitamins-Minerals (ALIVE WOMENS ENERGY) TABS Take 1 tablet by mouth daily.   . pantoprazole (PROTONIX) 40 MG tablet TAKE 1 TABLET BY MOUTH TWICE DAILY BEFORE A MEAL  . potassium chloride (MICRO-K) 10 MEQ CR capsule Take 1 capsule (10 mEq  total) by mouth daily.  . pramoxine-hydrocortisone (PROCTOCREAM-HC) 1-1 % rectal cream Place 1 application rectally 2 (two) times daily.  . traMADol (ULTRAM) 50 MG tablet TAKE 1 TABLET BY MOUTH TWICE DAILY AS NEEDED   No facility-administered encounter medications on file as of 08/01/2018.     Activities of Daily Living In your present state of health, do you have any difficulty performing the following activities: 08/07/2017 08/06/2017  Hearing? Y Y  Comment - Hearing Aid  Vision? Y Y  Difficulty concentrating or making decisions? Tempie Donning  Walking or climbing stairs? Y Y  Dressing or bathing? N N  Doing errands, shopping? Tempie Donning  Preparing Food and eating ? N N  Using the Toilet? N N  In the past six months, have you accidently leaked urine? Y Y  Do you have problems with loss of bowel control? N N  Managing your Medications? Y Y  Managing your Finances? Tempie Donning  Housekeeping or managing your Housekeeping? Tempie Donning  Some recent data might be hidden    Patient Care Team: Vivi Barrack, MD as PCP - General (Family Medicine) Adele Schilder, Arlyce Harman, MD as Consulting Physician (Psychiatry) Darleen Crocker, MD as Consulting Physician (Ophthalmology) Marybelle Killings, MD as Consulting Physician (Orthopedic Surgery)    Assessment:   This is a routine wellness examination for Xavier.  Exercise Activities and Dietary recommendations    Goals    . Weight < 205 lb (92.987 kg)       Fall Risk Fall  Risk  08/01/2018 04/29/2018 08/07/2017 08/06/2017 07/31/2017  Falls in the past year? Yes Yes Yes Yes Yes  Comment - - - - -  Number falls in past yr: 1 1 2  or more 2 or more 2 or more  Comment lights went out; not witnessed - - - -  Injury with Fall? - Yes Yes Yes Yes  Risk Factor Category  - High Fall Risk High Fall Risk High Fall Risk High Fall Risk  Risk for fall due to : - - History of fall(s);Impaired balance/gait;Impaired mobility;Mental status change History of fall(s) History of fall(s);Impaired  balance/gait;Impaired mobility  Risk for fall due to: Comment - - - - -  Follow up - Education provided Education provided;Falls prevention discussed Education provided;Falls prevention discussed Education provided;Falls prevention discussed   Had one fall I March 2018 and dog knocked her down  And states she is fearful no w  Depression Screen PHQ 2/9 Scores 08/01/2018 08/01/2018 08/07/2017 08/06/2017  PHQ - 2 Score 3 0 0 0  PHQ- 9 Score 5 - - -     Cognitive Function MMSE - Mini Mental State Exam 08/01/2018 04/29/2018 06/20/2017 01/26/2017 12/20/2016  Orientation to time 5 5 3 5 4   Orientation to Place 5 5 4 5 5   Registration 3 3 2 3 3   Attention/ Calculation 5 5 0 5 5  Recall 1 2 1 3 1   Language- name 2 objects 2 2 2 2 2   Language- repeat 1 1 1 1 1   Language- follow 3 step command 3 2 3 3 3   Language- read & follow direction 1 1 1 1 1   Write a sentence 1 1 1 1 1   Copy design 1 1 1  0 0  Total score 28 28 19 29 26         Immunization History  Administered Date(s) Administered  . Influenza Split 08/03/2011, 07/30/2012  . Influenza Whole 08/09/2007, 07/27/2008, 07/26/2009, 07/20/2010  . Influenza, High Dose Seasonal PF 07/17/2017, 08/01/2018  . Influenza,inj,Quad PF,6+ Mos 07/17/2013, 07/08/2015, 08/03/2016  . Influenza-Unspecified 07/18/2014  . Pneumococcal Conjugate-13 02/11/2015  . Pneumococcal Polysaccharide-23 08/26/2010, 11/02/2016  . Td 05/07/2009  . Zoster 10/09/2012      Screening Tests Health Maintenance  Topic Date Due  . MAMMOGRAM  07/06/2018  . TETANUS/TDAP  05/08/2019  . INFLUENZA VACCINE  Completed  . DEXA SCAN  Completed  . PNA vac Low Risk Adult  Completed         Plan:      PCP Notes   Health Maintenance Mammogram 06/2017; due soon; dtr may schedule with she has hers States she is afraid of falling if she lets go of her walker and that is why she does not want to have another mammogram   dexa 11/2006 - declines  Had flu vaccine  today  Educated regarding shingrix and Tetanus; (tdap )     dexa 11/2006      Abnormal Screens  MMSE 28/30; tangential in thinking 1/of 3 recall  Hearing is an issue as well, although she had a hearing aid  Discusses depression and states she is better. [place]; may like ADC and dtr given resources    Referrals  States balance is an issue but refuses PT  Does not want to go back to the pool  Patient concerns; As noted   Nurse Concerns; As noted   Next PCP apt Seeing Dr. [name] today       I have personally reviewed and noted the  following in the patient's chart:   . Medical and social history . Use of alcohol, tobacco or illicit drugs  . Current medications and supplements . Functional ability and status . Nutritional status . Physical activity . Advanced directives . List of other physicians . Hospitalizations, surgeries, and ER visits in previous 12 months . Vitals . Screenings to include cognitive, depression, and falls . Referrals and appointments  In addition, I have reviewed and discussed with patient certain preventive protocols, quality metrics, and best practice recommendations. A written personalized care plan for preventive services as well as general preventive health recommendations were provided to patient.     Wynetta Fines, RN  08/01/2018

## 2018-08-01 ENCOUNTER — Ambulatory Visit (INDEPENDENT_AMBULATORY_CARE_PROVIDER_SITE_OTHER): Payer: PPO | Admitting: Family Medicine

## 2018-08-01 ENCOUNTER — Encounter: Payer: Self-pay | Admitting: Family Medicine

## 2018-08-01 ENCOUNTER — Ambulatory Visit (INDEPENDENT_AMBULATORY_CARE_PROVIDER_SITE_OTHER): Payer: PPO

## 2018-08-01 VITALS — BP 132/60 | HR 82 | Temp 98.6°F | Ht 62.0 in | Wt 217.0 lb

## 2018-08-01 DIAGNOSIS — M1990 Unspecified osteoarthritis, unspecified site: Secondary | ICD-10-CM | POA: Diagnosis not present

## 2018-08-01 DIAGNOSIS — Z0000 Encounter for general adult medical examination without abnormal findings: Secondary | ICD-10-CM

## 2018-08-01 DIAGNOSIS — L910 Hypertrophic scar: Secondary | ICD-10-CM | POA: Insufficient documentation

## 2018-08-01 DIAGNOSIS — Z23 Encounter for immunization: Secondary | ICD-10-CM

## 2018-08-01 DIAGNOSIS — I10 Essential (primary) hypertension: Secondary | ICD-10-CM | POA: Diagnosis not present

## 2018-08-01 DIAGNOSIS — R600 Localized edema: Secondary | ICD-10-CM | POA: Diagnosis not present

## 2018-08-01 HISTORY — DX: Hypertrophic scar: L91.0

## 2018-08-01 NOTE — Patient Instructions (Signed)
It was very nice to see you today!  I am glad that you are doing well!  We gave you your flu shot today.  Please try to get back in with the physical therapist.  You can use cortisone cream as needed.  Come back to see me in 3-6 months, or sooner as needed.   Take care, Dr [name]   Preventive Care 77 Years and Older, Female Preventive care refers to lifestyle choices and visits with your health care provider that can promote health and wellness. What does preventive care include?  A yearly physical exam. This is also called an annual well check.  Dental exams once or twice a year.  Routine eye exams. Ask your health care provider how often you should have your eyes checked.  Personal lifestyle choices, including: ? Daily care of your teeth and gums. ? Regular physical activity. ? Eating a healthy diet. ? Avoiding tobacco and drug use. ? Limiting alcohol use. ? Practicing safe sex. ? Taking low-dose aspirin every day. ? Taking vitamin and mineral supplements as recommended by your health care provider. What happens during an annual well check? The services and screenings done by your health care provider during your annual well check will depend on your age, overall health, lifestyle risk factors, and family history of disease. Counseling Your health care provider may ask you questions about your:  Alcohol use.  Tobacco use.  Drug use.  Emotional well-being.  Home and relationship well-being.  Sexual activity.  Eating habits.  History of falls.  Memory and ability to understand (cognition).  Work and work Statistician.  Reproductive health.  Screening You may have the following tests or measurements:  Height, weight, and BMI.  Blood pressure.  Lipid and cholesterol levels. These may be checked every 5 years, or more frequently if you are over 53 years old.  Skin check.  Lung cancer screening. You may have this screening every year starting at age  10 if you have a 30-pack-year history of smoking and currently smoke or have quit within the past 15 years.  Fecal occult blood test (FOBT) of the stool. You may have this test every year starting at age 40.  Flexible sigmoidoscopy or colonoscopy. You may have a sigmoidoscopy every 5 years or a colonoscopy every 10 years starting at age 77.  Hepatitis C blood test.  Hepatitis B blood test.  Sexually transmitted disease (STD) testing.  Diabetes screening. This is done by checking your blood sugar (glucose) after you have not eaten for a while (fasting). You may have this done every 1-3 years.  Bone density scan. This is done to screen for osteoporosis. You may have this done starting at age 37.  Mammogram. This may be done every 1-2 years. Talk to your health care provider about how often you should have regular mammograms.  Talk with your health care provider about your test results, treatment options, and if necessary, the need for more tests. Vaccines Your health care provider may recommend certain vaccines, such as:  Influenza vaccine. This is recommended every year.  Tetanus, diphtheria, and acellular pertussis (Tdap, Td) vaccine. You may need a Td booster every 10 years.  Varicella vaccine. You may need this if you have not been vaccinated.  Zoster vaccine. You may need this after age 75.  Measles, mumps, and rubella (MMR) vaccine. You may need at least one dose of MMR if you were born in 1957 or later. You may also need a second dose.  Pneumococcal 13-valent conjugate (PCV13) vaccine. One dose is recommended after age 58.  Pneumococcal polysaccharide (PPSV23) vaccine. One dose is recommended after age 66.  Meningococcal vaccine. You may need this if you have certain conditions.  Hepatitis A vaccine. You may need this if you have certain conditions or if you travel or work in places where you may be exposed to hepatitis A.  Hepatitis B vaccine. You may need this if you  have certain conditions or if you travel or work in places where you may be exposed to hepatitis B.  Haemophilus influenzae type b (Hib) vaccine. You may need this if you have certain conditions.  Talk to your health care provider about which screenings and vaccines you need and how often you need them. This information is not intended to replace advice given to you by your health care provider. Make sure you discuss any questions you have with your health care provider. Document Released: 11/12/2015 Document Revised: 07/05/2016 Document Reviewed: 08/17/2015 Elsevier Interactive Patient Education  [employer].

## 2018-08-01 NOTE — Assessment & Plan Note (Addendum)
Recommend the patient go back to physical therapy.  Unfortunately she was not happy with her last pool session.  She would like to try again with a different therapist.  Continue Tylenol and tramadol as needed.

## 2018-08-01 NOTE — Assessment & Plan Note (Signed)
No red flag signs or symptoms.  Recommended over-the-counter cortisone cream as needed for pruritus.

## 2018-08-01 NOTE — Assessment & Plan Note (Signed)
Mild edema today likely secondary to dietary indiscretions (patient admits to eating quite a bit of pizza and popcorn lately) in addition to keeping her legs elevated.  Her lung exam is clear-no signs of volume overload.  Discussed importance of sodium restriction and leg elevation.  Patient voiced understanding.  She will work on this.  She will also use Lasix as needed.  Discussed reasons to return to care.

## 2018-08-01 NOTE — Patient Instructions (Addendum)
[name] , Thank you for taking time to come for your Medicare Wellness Visit. I appreciate your ongoing commitment to your health goals. Please review the following plan we discussed and let me know if I can assist you in the future.   Will taker you flu vaccine today    [facility]; [phone] [facility]; [phone] Get resource to get information on any and all community programs for Seniors  Community solutions; "Aging Gracefully In Place" program; can request or apply    Adult center for Enrichment;  [employer]; [phone]  Adult day services include Adult Day Care, Adult Day Healthcare, Group Respite, [facility], [facility], Education and Support Program    These are the goals we discussed: Goals    . Weight < 205 lb (92.987 kg)       This is a list of the screening recommended for you and due dates:  Health Maintenance  Topic Date Due  . Flu Shot  05/30/2018  . Mammogram  07/06/2018  . Tetanus Vaccine  05/08/2019  . DEXA scan (bone density measurement)  Completed  . Pneumonia vaccines  Completed      Fall Prevention in the Home Falls can cause injuries. They can happen to people of all ages. There are many things you can do to make your home safe and to help prevent falls. What can I do on the outside of my home?  Regularly fix the edges of walkways and driveways and fix any cracks.  Remove anything that might make you trip as you walk through a door, such as a raised step or threshold.  Trim any bushes or trees on the path to your home.  Use bright outdoor lighting.  Clear any walking paths of anything that might make someone trip, such as rocks or tools.  Regularly check to see if handrails are loose or broken. Make sure that both sides of any steps have handrails.  Any raised decks and porches should have guardrails on the edges.  Have any leaves, snow, or ice cleared regularly.  Use sand or salt on walking paths  during winter.  Clean up any spills in your garage right away. This includes oil or grease spills. What can I do in the bathroom?  Use night lights.  Install grab bars by the toilet and in the tub and shower. Do not use towel bars as grab bars.  Use non-skid mats or decals in the tub or shower.  If you need to sit down in the shower, use a plastic, non-slip stool.  Keep the floor dry. Clean up any water that spills on the floor as soon as it happens.  Remove soap buildup in the tub or shower regularly.  Attach bath mats securely with double-sided non-slip rug tape.  Do not have throw rugs and other things on the floor that can make you trip. What can I do in the bedroom?  Use night lights.  Make sure that you have a light by your bed that is easy to reach.  Do not use any sheets or blankets that are too big for your bed. They should not hang down onto the floor.  Have a firm chair that has side arms. You can use this for support while you get dressed.  Do not have throw rugs and other things on the floor that can make you trip. What can I do in the kitchen?  Clean up any spills right away.  Avoid  walking on wet floors.  Keep items that you use a lot in easy-to-reach places.  If you need to reach something above you, use a strong step stool that has a grab bar.  Keep electrical cords out of the way.  Do not use floor polish or wax that makes floors slippery. If you must use wax, use non-skid floor wax.  Do not have throw rugs and other things on the floor that can make you trip. What can I do with my stairs?  Do not leave any items on the stairs.  Make sure that there are handrails on both sides of the stairs and use them. Fix handrails that are broken or loose. Make sure that handrails are as long as the stairways.  Check any carpeting to make sure that it is firmly attached to the stairs. Fix any carpet that is loose or worn.  Avoid having throw rugs at the top  or bottom of the stairs. If you do have throw rugs, attach them to the floor with carpet tape.  Make sure that you have a light switch at the top of the stairs and the bottom of the stairs. If you do not have them, ask someone to add them for you. What else can I do to help prevent falls?  Wear shoes that: ? Do not have high heels. ? Have rubber bottoms. ? Are comfortable and fit you well. ? Are closed at the toe. Do not wear sandals.  If you use a stepladder: ? Make sure that it is fully opened. Do not climb a closed stepladder. ? Make sure that both sides of the stepladder are locked into place. ? Ask someone to hold it for you, if possible.  Clearly mark and make sure that you can see: ? Any grab bars or handrails. ? First and last steps. ? Where the edge of each step is.  Use tools that help you move around (mobility aids) if they are needed. These include: ? Canes. ? Walkers. ? Scooters. ? Crutches.  Turn on the lights when you go into a dark area. Replace any light bulbs as soon as they burn out.  Set up your furniture so you have a clear path. Avoid moving your furniture around.  If any of your floors are uneven, fix them.  If there are any pets around you, be aware of where they are.  Review your medicines with your doctor. Some medicines can make you feel dizzy. This can increase your chance of falling. Ask your doctor what other things that you can do to help prevent falls. This information is not intended to replace advice given to you by your health care provider. Make sure you discuss any questions you have with your health care provider. Document Released: 08/12/2009 Document Revised: 03/23/2016 Document Reviewed: 11/20/2014 Elsevier Interactive Patient Education  2018 [place] Maintenance, Female Adopting a healthy lifestyle and getting preventive care can go a long way to promote health and wellness. Talk with your health care provider about  what schedule of regular examinations is right for you. This is a good chance for you to check in with your provider about disease prevention and staying healthy. In between checkups, there are plenty of things you can do on your own. Experts have done a lot of research about which lifestyle changes and preventive measures are most likely to keep you healthy. Ask your health care provider for more information. Weight and diet Eat a healthy  diet  Be sure to include plenty of vegetables, fruits, low-fat dairy products, and lean protein.  Do not eat a lot of foods high in solid fats, added sugars, or salt.  Get regular exercise. This is one of the most important things you can do for your health. ? Most adults should exercise for at least 150 minutes each week. The exercise should increase your heart rate and make you sweat (moderate-intensity exercise). ? Most adults should also do strengthening exercises at least twice a week. This is in addition to the moderate-intensity exercise.  Maintain a healthy weight  Body mass index (BMI) is a measurement that can be used to identify possible weight problems. It estimates body fat based on height and weight. Your health care provider can help determine your BMI and help you achieve or maintain a healthy weight.  For females 77 years of age and older: ? A BMI below 18.5 is considered underweight. ? A BMI of 18.5 to 24.9 is normal. ? A BMI of 25 to 29.9 is considered overweight. ? A BMI of 30 and above is considered obese.  Watch levels of cholesterol and blood lipids  You should start having your blood tested for lipids and cholesterol at 77 years of age, then have this test every 5 years.  You may need to have your cholesterol levels checked more often if: ? Your lipid or cholesterol levels are high. ? You are older than 77 years of age. ? You are at high risk for heart disease.  Cancer screening Lung Cancer  Lung cancer screening is  recommended for adults 47-34 years old who are at high risk for lung cancer because of a history of smoking.  A yearly low-dose CT scan of the lungs is recommended for people who: ? Currently smoke. ? Have quit within the past 15 years. ? Have at least a 30-pack-year history of smoking. A pack year is smoking an average of one pack of cigarettes a day for 1 year.  Yearly screening should continue until it has been 15 years since you quit.  Yearly screening should stop if you develop a health problem that would prevent you from having lung cancer treatment.  Breast Cancer  Practice breast self-awareness. This means understanding how your breasts normally appear and feel.  It also means doing regular breast self-exams. Let your health care provider know about any changes, no matter how small.  If you are in your 20s or 30s, you should have a clinical breast exam (CBE) by a health care provider every 1-3 years as part of a regular health exam.  If you are 43 or older, have a CBE every year. Also consider having a breast X-ray (mammogram) every year.  If you have a family history of breast cancer, talk to your health care provider about genetic screening.  If you are at high risk for breast cancer, talk to your health care provider about having an MRI and a mammogram every year.  Breast cancer gene (BRCA) assessment is recommended for women who have family members with BRCA-related cancers. BRCA-related cancers include: ? Breast. ? Ovarian. ? Tubal. ? Peritoneal cancers.  Results of the assessment will determine the need for genetic counseling and BRCA1 and BRCA2 testing.  Cervical Cancer Your health care provider may recommend that you be screened regularly for cancer of the pelvic organs (ovaries, uterus, and vagina). This screening involves a pelvic examination, including checking for microscopic changes to the surface of your cervix (  Pap test). You may be encouraged to have this  screening done every 3 years, beginning at age 23.  For women ages 11-65, health care providers may recommend pelvic exams and Pap testing every 3 years, or they may recommend the Pap and pelvic exam, combined with testing for human papilloma virus (HPV), every 5 years. Some types of HPV increase your risk of cervical cancer. Testing for HPV may also be done on women of any age with unclear Pap test results.  Other health care providers may not recommend any screening for nonpregnant women who are considered low risk for pelvic cancer and who do not have symptoms. Ask your health care provider if a screening pelvic exam is right for you.  If you have had past treatment for cervical cancer or a condition that could lead to cancer, you need Pap tests and screening for cancer for at least 20 years after your treatment. If Pap tests have been discontinued, your risk factors (such as having a new sexual partner) need to be reassessed to determine if screening should resume. Some women have medical problems that increase the chance of getting cervical cancer. In these cases, your health care provider may recommend more frequent screening and Pap tests.  Colorectal Cancer  This type of cancer can be detected and often prevented.  Routine colorectal cancer screening usually begins at 77 years of age and continues through 77 years of age.  Your health care provider may recommend screening at an earlier age if you have risk factors for colon cancer.  Your health care provider may also recommend using home test kits to check for hidden blood in the stool.  A small camera at the end of a tube can be used to examine your colon directly (sigmoidoscopy or colonoscopy). This is done to check for the earliest forms of colorectal cancer.  Routine screening usually begins at age 89.  Direct examination of the colon should be repeated every 5-10 years through 77 years of age. However, you may need to be screened  more often if early forms of precancerous polyps or small growths are found.  Skin Cancer  Check your skin from head to toe regularly.  Tell your health care provider about any new moles or changes in moles, especially if there is a change in a mole's shape or color.  Also tell your health care provider if you have a mole that is larger than the size of a pencil eraser.  Always use sunscreen. Apply sunscreen liberally and repeatedly throughout the day.  Protect yourself by wearing long sleeves, pants, a wide-brimmed hat, and sunglasses whenever you are outside.  Heart disease, diabetes, and high blood pressure  High blood pressure causes heart disease and increases the risk of stroke. High blood pressure is more likely to develop in: ? People who have blood pressure in the high end of the normal range (130-139/85-89 mm Hg). ? People who are overweight or obese. ? People who are African American.  If you are 55-1 years of age, have your blood pressure checked every 3-5 years. If you are 78 years of age or older, have your blood pressure checked every year. You should have your blood pressure measured twice-once when you are at a hospital or clinic, and once when you are not at a hospital or clinic. Record the average of the two measurements. To check your blood pressure when you are not at a hospital or clinic, you can use: ? An automated  blood pressure machine at a pharmacy. ? A home blood pressure monitor.  If you are between 78 years and 52 years old, ask your health care provider if you should take aspirin to prevent strokes.  Have regular diabetes screenings. This involves taking a blood sample to check your fasting blood sugar level. ? If you are at a normal weight and have a low risk for diabetes, have this test once every three years after 77 years of age. ? If you are overweight and have a high risk for diabetes, consider being tested at a younger age or more often. Preventing  infection Hepatitis B  If you have a higher risk for hepatitis B, you should be screened for this virus. You are considered at high risk for hepatitis B if: ? You were born in a country where hepatitis B is common. Ask your health care provider which countries are considered high risk. ? Your parents were born in a high-risk country, and you have not been immunized against hepatitis B (hepatitis B vaccine). ? You have HIV or AIDS. ? You use needles to inject street drugs. ? You live with someone who has hepatitis B. ? You have had sex with someone who has hepatitis B. ? You get hemodialysis treatment. ? You take certain medicines for conditions, including cancer, organ transplantation, and autoimmune conditions.  Hepatitis C  Blood testing is recommended for: ? Everyone born from [birth year] through 1965. ? Anyone with known risk factors for hepatitis C.  Sexually transmitted infections (STIs)  You should be screened for sexually transmitted infections (STIs) including gonorrhea and chlamydia if: ? You are sexually active and are younger than 77 years of age. ? You are older than 77 years of age and your health care provider tells you that you are at risk for this type of infection. ? Your sexual activity has changed since you were last screened and you are at an increased risk for chlamydia or gonorrhea. Ask your health care provider if you are at risk.  If you do not have HIV, but are at risk, it may be recommended that you take a prescription medicine daily to prevent HIV infection. This is called pre-exposure prophylaxis (PrEP). You are considered at risk if: ? You are sexually active and do not regularly use condoms or know the HIV status of your partner(s). ? You take drugs by injection. ? You are sexually active with a partner who has HIV.  Talk with your health care provider about whether you are at high risk of being infected with HIV. If you choose to begin PrEP, you should first be  tested for HIV. You should then be tested every 3 months for as long as you are taking PrEP. Pregnancy  If you are premenopausal and you may become pregnant, ask your health care provider about preconception counseling.  If you may become pregnant, take 400 to 800 micrograms (mcg) of folic acid every day.  If you want to prevent pregnancy, talk to your health care provider about birth control (contraception). Osteoporosis and menopause  Osteoporosis is a disease in which the bones lose minerals and strength with aging. This can result in serious bone fractures. Your risk for osteoporosis can be identified using a bone density scan.  If you are 58 years of age or older, or if you are at risk for osteoporosis and fractures, ask your health care provider if you should be screened.  Ask your health care provider whether you should  take a calcium or vitamin D supplement to lower your risk for osteoporosis.  Menopause may have certain physical symptoms and risks.  Hormone replacement therapy may reduce some of these symptoms and risks. Talk to your health care provider about whether hormone replacement therapy is right for you. Follow these instructions at home:  Schedule regular health, dental, and eye exams.  Stay current with your immunizations.  Do not use any tobacco products including cigarettes, chewing tobacco, or electronic cigarettes.  If you are pregnant, do not drink alcohol.  If you are breastfeeding, limit how much and how often you drink alcohol.  Limit alcohol intake to no more than 1 drink per day for nonpregnant women. One drink equals 12 ounces of beer, 5 ounces of wine, or 1 ounces of hard liquor.  Do not use street drugs.  Do not share needles.  Ask your health care provider for help if you need support or information about quitting drugs.  Tell your health care provider if you often feel depressed.  Tell your health care provider if you have ever been abused  or do not feel safe at home. This information is not intended to replace advice given to you by your health care provider. Make sure you discuss any questions you have with your health care provider. Document Released: 05/01/2011 Document Revised: 03/23/2016 Document Reviewed: 07/20/2015 Elsevier Interactive Patient Education  [employer].

## 2018-08-01 NOTE — Progress Notes (Signed)
Subjective:  [name] is a 77 y.o. female who presents today for her annual comprehensive physical exam.    HPI:  She has no acute complaints today.   She has a few minor concerns outlined below 1.  Leg edema.  Worsened over the last few days.  Motor her bed recently broke and she has been able to elevate her legs at night.  She has been taking Lasix every other day which seems to help.   2.  Keloid.  More pruritic over the last few days.  Does not increase in size.  No redness.  No treatments tried.  Lifestyle Diet: No specific diet Exercise: Works with physical therapy  Depression screen [facility] 2/9 08/01/2018  Decreased Interest 3  Down, Depressed, Hopeless 0  PHQ - 2 Score 3  Altered sleeping 1  Tired, decreased energy 0  Change in appetite 0  Feeling bad or failure about yourself  1  Trouble concentrating (No Data)  Moving slowly or fidgety/restless -  Suicidal thoughts -  PHQ-9 Score 5  Difficult doing work/chores -  Some recent data might be hidden    Health Maintenance Due  Topic Date Due  . MAMMOGRAM  07/06/2018     ROS: Per HPI, otherwise a complete review of systems was negative.   PMH:  The following were reviewed and entered/updated in epic: Past Medical History:  Diagnosis Date  . Chronic venous insufficiency   . Colon, diverticulosis Sept. 2011   outpatient colonoscopy by Dr. Cristina Gong.  Need record  . DDD (degenerative disc disease), lumbosacral   . Depression   . DJD (degenerative joint disease), multiple sites    Low back pain worst  . GERD (gastroesophageal reflux disease)   . Gout   . Gout    Uric Acid level 4.2 on 300 allopurinol  . History of cervical cancer 1972  . Hx-TIA (transient ischemic attack) 05/13/2001   Right facial numbness  . Hyperlipidemia   . Hypertension   . Memory disorder 12/20/2016  . Mitral valve prolapse 10/27/1999   Subsequent 2D echo show normal mitral valve  . Osteoarthritis of hip    bilateral hips  .  Ovarian cancer ([place]) 1972   S/P oophorectomy  . Psychosis ([place])   . Recurrent boils    History of MRSA skin infections with abscess  . Sensorineural hearing loss of both ears   . Subdural hematoma ([place]) 01/26/2017   bilateral   Patient Active Problem List   Diagnosis Date Noted  . Keloid 08/01/2018  . Hyperglycemia 05/06/2018  . Dyslipidemia 04/30/2018  . Constipation 04/30/2018  . Osteoarthritis 01/31/2018  . Uncomplicated opioid dependence ([place]) 01/31/2018  . Lower extremity edema 07/31/2017  . History of bilateral hip replacements   . Memory disorder 12/20/2016  . History of lumbar laminectomy for spinal cord decompression 02/02/2016  . Atopic dermatitis 07/09/2015  . Reactive airway disease 08/11/2014  . History of cancer 08/11/2014  . OSA (obstructive sleep apnea) 08/11/2014  . Chronic kidney disease (CKD), stage III (moderate) ([place]) 10/04/2012  . Schizophrenia, chronic condition ([place]) 01/20/2012  . Postmenopausal atrophic vaginitis 11/01/2010  . Chronic diastolic heart failure ([place]) 05/06/2010  . Allergic rhinitis 10/14/2009  . Obesity, Class II, BMI 35-39.9, with comorbidity 05/14/2009  . Chronic prescription opiate use 04/29/2008  . Normocytic anemia 09/25/2007  . Hyperlipidemia 09/03/2006  . Gout 09/03/2006  . Essential hypertension 09/03/2006  . GERD 09/03/2006  . Hx-TIA (transient ischemic attack) 05/13/2001   Past Surgical History:  Procedure Laterality Date  . ABDOMINAL HYSTERECTOMY     1972  . BURR HOLE Bilateral 02/12/2017   Procedure: Haskell Flirt;  Surgeon: Earnie Larsson, MD;  Location: [place];  Service: Neurosurgery;  Laterality: Bilateral;  . CERVICAL DISCECTOMY  7/07   C5-C6  . JOINT REPLACEMENT     bilateral hip replacement  . LUMBAR DISC SURGERY     L5-S1 7/07  . LUMBAR LAMINECTOMY/DECOMPRESSION MICRODISCECTOMY N/A 02/02/2016   Procedure: L4-5 Decompression;  Surgeon: Marybelle Killings, MD;  Location: [place];  Service: Orthopedics;  Laterality: N/A;  .  [place] for ovarian cancer    Family History  Problem Relation Age of Onset  . Diabetes Mother   . Heart disease Mother   . Hearing loss Mother   . Stroke Mother   . Post-traumatic stress disorder Father   . Schizophrenia Maternal Grandmother   . Diabetes Maternal Grandfather   . Other Brother        brother died in mental health hospital  . Kidney cancer Neg Hx   . Bladder Cancer Neg Hx     Medications- reviewed and updated Current Outpatient Medications  Medication Sig Dispense Refill  . acetaminophen (TYLENOL) 500 MG tablet Take 500 mg by mouth every 8 (eight) hours as needed.    [name] allopurinol (ZYLOPRIM) 300 MG tablet Take 1 tablet (300 mg total) by mouth daily. 90 tablet 3  . amLODipine (NORVASC) 10 MG tablet Take 1 tablet (10 mg total) by mouth every other day. 90 tablet 3  . ascorbic acid (VITAMIN C) 500 MG tablet Take 500 mg by mouth daily.    [name] atorvastatin (LIPITOR) 40 MG tablet TAKE 1 TABLET BY MOUTH EVERY DAY 90 tablet 3  . buPROPion (WELLBUTRIN XL) 300 MG 24 hr tablet Take 1 tablet (300 mg total) by mouth every morning. 30 tablet 1  . diclofenac sodium (VOLTAREN) 1 % GEL Apply 4 g topically 4 (four) times daily. 100 g 5  . fluticasone (FLONASE) 50 MCG/ACT nasal spray INSTILL 2 SPRAYS IN EACH NOSTRIL DAILY 16 g 11  . hydrochlorothiazide (HYDRODIURIL) 12.5 MG tablet Take 1 tablet (12.5 mg total) by mouth daily. 30 tablet 0  . hydrochlorothiazide (HYDRODIURIL) 12.5 MG tablet TAKE ONE TABLET BY MOUTH EVERY DAY 90 tablet 2  . hydrOXYzine (VISTARIL) 25 MG capsule Take 1 capsule (25 mg total) by mouth at bedtime. 30 capsule 1  . lactobacillus (FLORANEX/LACTINEX) PACK Take 1 packet (1 g total) by mouth 3 (three) times daily with meals. 90 each 11  . losartan (COZAAR) 100 MG tablet TAKE ONE TABLET BY MOUTH DAILY 30 tablet 11  . memantine (NAMENDA TITRATION PAK) tablet pack 5 mg/day for =1 week; 5 mg twice daily for =1 week; 15 mg/day given in 5 mg and 10 mg  separated doses for =1 week; then 10 mg twice daily 49 tablet 0  . Multiple Vitamins-Minerals (ALIVE WOMENS ENERGY) TABS Take 1 tablet by mouth daily.     . pantoprazole (PROTONIX) 40 MG tablet TAKE 1 TABLET BY MOUTH TWICE DAILY BEFORE A MEAL 180 tablet 3  . potassium chloride (MICRO-K) 10 MEQ CR capsule Take 1 capsule (10 mEq total) by mouth daily. 90 capsule 2  . pramoxine-hydrocortisone (PROCTOCREAM-HC) 1-1 % rectal cream Place 1 application rectally 2 (two) times daily. 30 g 0  . traMADol (ULTRAM) 50 MG tablet TAKE 1 TABLET BY MOUTH TWICE DAILY AS NEEDED 180 tablet 0   No current facility-administered medications  for this visit.     Allergies-reviewed and updated Allergies  Allergen Reactions  . Aspirin Other (See Comments)    Bleeding from vagina   . Sulfamethoxazole-Trimethoprim Itching    Social History   Socioeconomic History  . Marital status: Widowed    Spouse name: Not on file  . Number of children: 3  . Years of education: 16  . Highest education level: Not on file  Occupational History  . Occupation: Retired  Scientific laboratory technician  . Financial resource strain: Not on file  . Food insecurity:    Worry: Not on file    Inability: Not on file  . Transportation needs:    Medical: Not on file    Non-medical: Not on file  Tobacco Use  . Smoking status: Former Smoker    Packs/day: 0.10    Years: 35.00    Pack years: 3.50    Types: Cigarettes    Last attempt to quit: 04/08/2013    Years since quitting: 5.3  . Smokeless tobacco: Never Used  . Tobacco comment: smoked a few when her son died in 2013-02-01   Substance and Sexual Activity  . Alcohol use: No    Alcohol/week: 0.0 standard drinks  . Drug use: No  . Sexual activity: Never  Lifestyle  . Physical activity:    Days per week: Not on file    Minutes per session: Not on file  . Stress: Not on file  Relationships  . Social connections:    Talks on phone: Not on file    Gets together: Not on file    Attends religious  service: Not on file    Active member of club or organization: Not on file    Attends meetings of clubs or organizations: Not on file    Relationship status: Not on file  Other Topics Concern  . Not on file  Social History Narrative   Lives with daughter in [place]. Daughter is primary caretaker, surrogate decision-maker, and arranges for [facility] aide and other caregivers to supervise Ms. Schommer at home. Ambulatory    Objective:  Physical Exam: BP 132/60 (BP Location: Left Arm, Patient Position: Sitting, Cuff Size: Normal)   Pulse 82   Temp 98.6 F (37 C) (Oral)   Ht 5\' 2"  (1.575 m)   Wt 217 lb (98.4 kg)   SpO2 95%   BMI 39.69 kg/m   Body mass index is 39.69 kg/m. Wt Readings from Last 3 Encounters:  08/01/18 217 lb (98.4 kg)  08/01/18 217 lb (98.4 kg)  04/30/18 217 lb 6.4 oz (98.6 kg)   Gen: NAD, resting comfortably HEENT: TMs normal bilaterally. OP clear. No thyromegaly noted.  CV: RRR with no murmurs appreciated Pulm: NWOB, CTAB with no crackles, wheezes, or rhonchi GI: Normal bowel sounds present. Soft, Nontender, Nondistended. MSK: 2+ edema in bilateral lower extremities, cyanosis, or clubbing noted Skin: warm, dry.  Keloid on mid chest noted no surrounding erythema Neuro: CN2-12 grossly intact. Strength 5/5 in upper and lower extremities. Reflexes symmetric and intact bilaterally.  Psych: Normal affect and thought content  Assessment/Plan:  Keloid No red flag signs or symptoms.  Recommended over-the-counter cortisone cream as needed for pruritus.  Osteoarthritis Recommend the patient go back to physical therapy.  Unfortunately she was not happy with her last pool session.  She would like to try again with a different therapist.  Continue Tylenol and tramadol as needed.  Lower extremity edema Mild edema today likely secondary to dietary indiscretions (patient  admits to eating quite a bit of pizza and popcorn lately) in addition to keeping her legs elevated.  Her  lung exam is clear-no signs of volume overload.  Discussed importance of sodium restriction and leg elevation.  Patient voiced understanding.  She will work on this.  She will also use Lasix as needed.  Discussed reasons to return to care.  Preventative Healthcare: Flu shot given today.  Mammogram ordered.  Up-to-date on her other vaccines and screenings.  Patient Counseling(The following topics were reviewed and/or handout was given):  -Nutrition: Stressed importance of moderation in sodium/caffeine intake, saturated fat and cholesterol, caloric balance, sufficient intake of fresh fruits, vegetables, and fiber.  -Stressed the importance of regular exercise.   -Substance Abuse: Discussed cessation/primary prevention of tobacco, alcohol, or other drug use; driving or other dangerous activities under the influence; availability of treatment for abuse.   -Injury prevention: Discussed safety belts, safety helmets, smoke detector, smoking near bedding or upholstery.   -Sexuality: Discussed sexually transmitted diseases, partner selection, use of condoms, avoidance of unintended pregnancy and contraceptive alternatives.   -Dental health: Discussed importance of regular tooth brushing, flossing, and dental visits.  -Health maintenance and immunizations reviewed. Please refer to Health maintenance section.  Return to care in 1 year for next preventative visit.   Algis Greenhouse. [name], MD 08/01/2018 5:19 PM

## 2018-08-02 NOTE — Progress Notes (Signed)
I have personally reviewed the Medicare Annual Wellness Visit and agree with the assessment and plan.  [name]. [name], MD 08/02/2018 10:32 AM

## 2018-08-16 ENCOUNTER — Other Ambulatory Visit: Payer: Self-pay | Admitting: Family Medicine

## 2018-08-16 ENCOUNTER — Other Ambulatory Visit (HOSPITAL_COMMUNITY): Payer: Self-pay | Admitting: Psychiatry

## 2018-08-16 DIAGNOSIS — F200 Paranoid schizophrenia: Secondary | ICD-10-CM

## 2018-08-26 ENCOUNTER — Ambulatory Visit: Payer: Self-pay

## 2018-08-29 ENCOUNTER — Encounter (HOSPITAL_COMMUNITY): Payer: Self-pay | Admitting: Psychiatry

## 2018-08-29 ENCOUNTER — Ambulatory Visit (INDEPENDENT_AMBULATORY_CARE_PROVIDER_SITE_OTHER): Payer: PPO | Admitting: Psychiatry

## 2018-08-29 VITALS — BP 121/75 | HR 80 | Ht 62.0 in | Wt 216.0 lb

## 2018-08-29 DIAGNOSIS — F200 Paranoid schizophrenia: Secondary | ICD-10-CM

## 2018-08-29 DIAGNOSIS — F411 Generalized anxiety disorder: Secondary | ICD-10-CM

## 2018-08-29 MED ORDER — HYDROXYZINE PAMOATE 25 MG PO CAPS: 25.0000 mg | ORAL_CAPSULE | Freq: Every day | ORAL | 0 refills | Status: DC

## 2018-08-29 MED ORDER — BUPROPION HCL ER (XL) 300 MG PO TB24: 300.0000 mg | ORAL_TABLET | Freq: Every morning | ORAL | 0 refills | Status: DC
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
## 2018-09-05 DIAGNOSIS — M6281 Muscle weakness (generalized): Secondary | ICD-10-CM | POA: Diagnosis not present

## 2018-09-05 DIAGNOSIS — R262 Difficulty in walking, not elsewhere classified: Secondary | ICD-10-CM | POA: Diagnosis not present

## 2018-09-05 DIAGNOSIS — M545 Low back pain: Secondary | ICD-10-CM | POA: Diagnosis not present

## 2018-09-05 DIAGNOSIS — R2681 Unsteadiness on feet: Secondary | ICD-10-CM | POA: Diagnosis not present

## 2018-09-23 ENCOUNTER — Other Ambulatory Visit: Payer: Self-pay

## 2018-09-23 NOTE — Patient Outreach (Signed)
[facility]) Care Management  09/23/2018  [name] Nov 15, 1940 [id number]   Telephone Screen  Referral Date: 09/23/18 Referral Source: Urgent-[facility] Concierge Referral Reason: " member needs possible mental assistance and food assistance, member states little to no food in home and is hungry" Insurance:HTA   Outreach attempt # 1 to patient. No answer at present. RN CM left HIPAA compliant voicemail message along with contact info.     Plan: RN CM will make outreach attempt to patient within 3-4 business days. RN CM will send unsuccessful outreach letter to patient.    Enzo Montgomery, RN,BSN,CCM [facility] Telephonic Care Management Coordinator Direct Phone: [phone] Toll Free: [phone] Fax: [phone]

## 2018-09-24 ENCOUNTER — Other Ambulatory Visit: Payer: Self-pay
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
## 2018-09-27 ENCOUNTER — Other Ambulatory Visit: Payer: Self-pay

## 2018-09-27 NOTE — Patient Outreach (Addendum)
[facility]) Care Management  09/27/2018  [name] 01-11-1941 [id number]   Telephone Screen  Referral Date:09/23/18 Referral Source:Urgent-HTA Concierge Referral Reason:" member needs possible mental assistance and food assistance, member states little to no food in home and is hungry" Insurance:HTA    Outreach attempt #3 to patient. No answer at present after multiple rings and unable to leave message.     Plan: RN CM will close case if no response from letter mailed to patient.  Enzo Montgomery, RN,BSN,CCM [place] Management Telephonic Care Management Coordinator Direct Phone: [phone] Toll Free: [phone] Fax: [phone]

## 2018-10-04 ENCOUNTER — Other Ambulatory Visit: Payer: Self-pay

## 2018-10-04 ENCOUNTER — Ambulatory Visit: Payer: Self-pay

## 2018-10-04 NOTE — Patient Outreach (Signed)
[facility]) Care Management  10/04/2018  FAREEHA EVON 1941/08/27 [id number]   Telephone Screen  Referral Date:09/23/18 Referral Source:Urgent-HTA Concierge Referral Reason:" member needs possible mental assistance and food assistance, member states little to no food in home and is hungry" Insurance:HTA    Multiple attempts to establish contact with patient without success. No response from letter mailed to patient. Case is being closed at this time.     Plan: RN CM will close case at this time. RN CM will send MD case closure letter.  Enzo Montgomery, RN,BSN,CCM [place] Management Telephonic Care Management Coordinator Direct Phone: [phone] Toll Free: [phone] Fax: [phone]

## 2018-10-10 ENCOUNTER — Ambulatory Visit: Payer: Self-pay | Admitting: *Deleted

## 2018-10-10 NOTE — Telephone Encounter (Signed)
Left voice message to call clinic. Home phone number busy.

## 2018-10-10 NOTE — Telephone Encounter (Signed)
Please advise 
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
## 2018-10-10 NOTE — Telephone Encounter (Signed)
Can we call to check on patient? She needs to go to the ED for psych eval if she is suicidal.  [name]. [name], MD 10/10/2018 1:26 PM

## 2018-10-15 NOTE — Telephone Encounter (Signed)
Attempted to call patient to follow up,no answer. Also no answer from  daughter [name]. Not able to leave voice message.

## 2018-10-16 ENCOUNTER — Telehealth: Payer: Self-pay | Admitting: Family Medicine

## 2018-10-16 NOTE — Telephone Encounter (Signed)
Left voice message to daughter Ms.[name] to call clinic urgent regarding patient.

## 2018-10-16 NOTE — Telephone Encounter (Signed)
Patient stated she has an head injury due to a fall, confused on when injury occurred. Also stated she  has not eaten and is not feeling well .I informed her that someone will come out to check on her she stated that was ok.

## 2018-10-16 NOTE — Telephone Encounter (Signed)
Pt daughter called. Please call back as soon as possible.

## 2018-10-16 NOTE — Telephone Encounter (Signed)
See note
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
## 2018-10-16 NOTE — Telephone Encounter (Signed)
Pt called in and stated that she would like a script for a hearing [facility] is going to provide her with a set.  All she needs is a script from her PCP , she would like someone to call her back in the morning.    Best number  [phone]
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
## 2018-10-16 NOTE — Telephone Encounter (Signed)
Daughter returned call, hung up before call was transferred, [facility] stated daughter stated she does not know why we our calling her and that she has to work.
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
## 2018-10-16 NOTE — Telephone Encounter (Signed)
Pt's daughter returning call.

## 2018-10-16 NOTE — Telephone Encounter (Signed)
Returned call to daughter left voice message to call clinic.

## 2018-10-17 NOTE — Telephone Encounter (Signed)
I have reached out to the [facility] to find out what information they need from [place] and I am waiting for a return call.

## 2018-10-21 NOTE — Telephone Encounter (Signed)
The person that I need to speak with regarding this matter is not in the office at this time due to the Christmas holiday.  CRM created.

## 2018-10-31 ENCOUNTER — Other Ambulatory Visit: Payer: Self-pay | Admitting: Family Medicine

## 2018-10-31 DIAGNOSIS — M545 Low back pain, unspecified: Secondary | ICD-10-CM

## 2018-11-01 ENCOUNTER — Other Ambulatory Visit: Payer: Self-pay | Admitting: Family Medicine

## 2018-11-05 NOTE — Telephone Encounter (Signed)
Rx Request 

## 2018-11-05 NOTE — Telephone Encounter (Signed)
Rx declined. Needs appointment.  Algis Greenhouse. [name], MD 11/05/2018 9:30 AM

## 2018-11-05 NOTE — Telephone Encounter (Signed)
Noted  

## 2018-11-27 ENCOUNTER — Telehealth: Payer: Self-pay | Admitting: *Deleted

## 2018-11-27 ENCOUNTER — Ambulatory Visit: Payer: PPO | Admitting: Adult Health

## 2018-11-27 NOTE — Telephone Encounter (Signed)
Patient was no show for follow up with NP today.  

## 2018-11-28 ENCOUNTER — Ambulatory Visit (INDEPENDENT_AMBULATORY_CARE_PROVIDER_SITE_OTHER): Payer: Medicare Other | Admitting: Psychiatry

## 2018-11-28 ENCOUNTER — Encounter (HOSPITAL_COMMUNITY): Payer: Self-pay | Admitting: Psychiatry

## 2018-11-28 VITALS — BP 155/76 | HR 75 | Resp 20 | Wt 209.4 lb

## 2018-11-28 DIAGNOSIS — F200 Paranoid schizophrenia: Secondary | ICD-10-CM | POA: Diagnosis not present

## 2018-11-28 DIAGNOSIS — F411 Generalized anxiety disorder: Secondary | ICD-10-CM | POA: Diagnosis not present

## 2018-11-28 MED ORDER — BUPROPION HCL ER (XL) 300 MG PO TB24: 300.0000 mg | ORAL_TABLET | Freq: Every morning | ORAL | 0 refills | Status: DC

## 2018-11-28 MED ORDER — HALOPERIDOL 0.5 MG PO TABS: 0.5000 mg | ORAL_TABLET | Freq: Every day | ORAL | 0 refills | Status: DC

## 2018-11-28 MED ORDER — HYDROXYZINE PAMOATE 25 MG PO CAPS: 25.0000 mg | ORAL_CAPSULE | Freq: Every day | ORAL | 0 refills | Status: DC
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
## 2018-12-03 ENCOUNTER — Other Ambulatory Visit: Payer: Self-pay

## 2018-12-03 ENCOUNTER — Emergency Department (HOSPITAL_COMMUNITY): Payer: Medicare Other

## 2018-12-03 ENCOUNTER — Emergency Department (HOSPITAL_COMMUNITY)
Admission: EM | Admit: 2018-12-03 | Discharge: 2018-12-03 | Disposition: A | Discharge status: Home / Self Care | Payer: Medicare Other | Attending: Emergency Medicine | Admitting: Emergency Medicine

## 2018-12-03 ENCOUNTER — Encounter (HOSPITAL_COMMUNITY): Payer: Self-pay

## 2018-12-03 DIAGNOSIS — Z79899 Other long term (current) drug therapy: Secondary | ICD-10-CM | POA: Insufficient documentation

## 2018-12-03 DIAGNOSIS — Z046 Encounter for general psychiatric examination, requested by authority: Secondary | ICD-10-CM

## 2018-12-03 DIAGNOSIS — I10 Essential (primary) hypertension: Secondary | ICD-10-CM | POA: Diagnosis not present

## 2018-12-03 DIAGNOSIS — F209 Schizophrenia, unspecified: Secondary | ICD-10-CM | POA: Insufficient documentation

## 2018-12-03 DIAGNOSIS — Z87891 Personal history of nicotine dependence: Secondary | ICD-10-CM | POA: Insufficient documentation

## 2018-12-03 LAB — COMPREHENSIVE METABOLIC PANEL
ALT: 17 U/L (ref 0–44)
AST: 22 U/L (ref 15–41)
Albumin: 3.9 g/dL (ref 3.5–5.0)
Alkaline Phosphatase: 109 U/L (ref 38–126)
Anion gap: 9 (ref 5–15)
BUN: 12 mg/dL (ref 8–23)
CO2: 23 mmol/L (ref 22–32)
Calcium: 9.6 mg/dL (ref 8.9–10.3)
Chloride: 112 mmol/L — ABNORMAL HIGH (ref 98–111)
Creatinine, Ser: 1.13 mg/dL — ABNORMAL HIGH (ref 0.44–1.00)
GFR calc Af Amer: 54 mL/min — ABNORMAL LOW (ref >60)
GFR calc non Af Amer: 47 mL/min — ABNORMAL LOW (ref >60)
Glucose, Bld: 105 mg/dL — ABNORMAL HIGH (ref 70–99)
Potassium: 3.9 mmol/L (ref 3.5–5.1)
Sodium: 144 mmol/L (ref 135–145)
Total Bilirubin: 0.8 mg/dL (ref 0.3–1.2)
Total Protein: 7.2 g/dL (ref 6.5–8.1)

## 2018-12-03 LAB — ACETAMINOPHEN LEVEL: Acetaminophen (Tylenol), Serum: <10 ug/mL — ABNORMAL LOW (ref 10–30)

## 2018-12-03 LAB — URINALYSIS, ROUTINE W REFLEX MICROSCOPIC
Bilirubin Urine: NEGATIVE
Glucose, UA: NEGATIVE mg/dL
Hgb urine dipstick: NEGATIVE
Ketones, ur: NEGATIVE mg/dL
Leukocytes, UA: NEGATIVE
Nitrite: NEGATIVE
Protein, ur: NEGATIVE mg/dL
Specific Gravity, Urine: 1.01 (ref 1.005–1.030)
pH: 6 (ref 5.0–8.0)

## 2018-12-03 LAB — CBC
HCT: 39.1 % (ref 36.0–46.0)
Hemoglobin: 12.8 g/dL (ref 12.0–15.0)
MCH: 32.2 pg (ref 26.0–34.0)
MCHC: 32.7 g/dL (ref 30.0–36.0)
MCV: 98.5 fL (ref 80.0–100.0)
Platelets: 214 10*3/uL (ref 150–400)
RBC: 3.97 MIL/uL (ref 3.87–5.11)
RDW: 14.1 % (ref 11.5–15.5)
WBC: 6 10*3/uL (ref 4.0–10.5)
nRBC: 0 % (ref 0.0–0.2)

## 2018-12-03 LAB — RAPID URINE DRUG SCREEN, HOSP PERFORMED
Amphetamines: NOT DETECTED
Barbiturates: NOT DETECTED
Benzodiazepines: NOT DETECTED
Cocaine: NOT DETECTED
Opiates: NOT DETECTED
Tetrahydrocannabinol: NOT DETECTED

## 2018-12-03 LAB — ETHANOL: Alcohol, Ethyl (B): <10 mg/dL (ref <10)

## 2018-12-03 LAB — SALICYLATE LEVEL: Salicylate Lvl: <7 mg/dL (ref 2.8–30.0)

## 2018-12-03 NOTE — ED Notes (Addendum)
Pt and daughter aware D/C papers should be completed soon. ALL belongings - 1 labeled belongings bag - returned to pt - Pt verified all items present.

## 2018-12-03 NOTE — ED Provider Notes (Signed)
[facility] EMERGENCY DEPARTMENT Provider Note   CSN: [id number] Arrival date & time: 12/03/18  1203     History   Chief Complaint Chief Complaint  Patient presents with  . Suicidal    HPI [name] is a 78 y.o. female with history of venous insufficiency, TIA, hypertension, hyperlipidemia, memory disorder, psychosis, schizophrenia, CKD presents for evaluation under IVC.  IVC paperwork was taken out by the patient's social worker and states that the patient has been making threatening comments and making suicidal threats.  She left a voicemail on the social workers phone stating that she had a gun and would use it to harm others if she was taking out of her home.  There is concerned that she is unable to care for herself and her daughter is in the process of applying for guardianship and the patient has a competency hearing later this month.  The patient on examination is resting comfortably, drinking coffee.  She is a poor historian, exhibits tangential thinking  and flight of ideas.  She states that the social workers do not like her and she does not like them. She also states that her grandfather raised dogs for [employer] and her father was a bootlegger.  She denies any physical complaints including fevers, chest pain, shortness of breath, abdominal pain, nausea, vomiting, or weakness.  She denies suicidal ideation or homicidal ideation.  The history is provided by the patient.    Past Medical History:  Diagnosis Date  . Chronic venous insufficiency   . Colon, diverticulosis Sept. 2011   outpatient colonoscopy by Dr. Cristina Gong.  Need record  . DDD (degenerative disc disease), lumbosacral   . Depression   . DJD (degenerative joint disease), multiple sites    Low back pain worst  . GERD (gastroesophageal reflux disease)   . Gout   . Gout    Uric Acid level 4.2 on 300 allopurinol  . History of cervical cancer 1972  . Hx-TIA (transient ischemic attack)  05/13/2001   Right facial numbness  . Hyperlipidemia   . Hypertension   . Memory disorder 12/20/2016  . Mitral valve prolapse 10/27/1999   Subsequent 2D echo show normal mitral valve  . Osteoarthritis of hip    bilateral hips  . Ovarian cancer ([place]) 1972   S/P oophorectomy  . Psychosis ([place])   . Recurrent boils    History of MRSA skin infections with abscess  . Sensorineural hearing loss of both ears   . Subdural hematoma ([place]) 01/26/2017   bilateral    Patient Active Problem List   Diagnosis Date Noted  . Keloid 08/01/2018  . Hyperglycemia 05/06/2018  . Dyslipidemia 04/30/2018  . Constipation 04/30/2018  . Osteoarthritis 01/31/2018  . Uncomplicated opioid dependence ([place]) 01/31/2018  . Lower extremity edema 07/31/2017  . History of bilateral hip replacements   . Memory disorder 12/20/2016  . History of lumbar laminectomy for spinal cord decompression 02/02/2016  . Atopic dermatitis 07/09/2015  . Reactive airway disease 08/11/2014  . History of cancer 08/11/2014  . OSA (obstructive sleep apnea) 08/11/2014  . Chronic kidney disease (CKD), stage III (moderate) ([place]) 10/04/2012  . Schizophrenia, chronic condition ([place]) 01/20/2012  . Postmenopausal atrophic vaginitis 11/01/2010  . Chronic diastolic heart failure ([place]) 05/06/2010  . Allergic rhinitis 10/14/2009  . Obesity, Class II, BMI 35-39.9, with comorbidity 05/14/2009  . Chronic prescription opiate use 04/29/2008  . Normocytic anemia 09/25/2007  . Hyperlipidemia 09/03/2006  . Gout 09/03/2006  . Essential hypertension  09/03/2006  . GERD 09/03/2006  . Hx-TIA (transient ischemic attack) 05/13/2001    Past Surgical History:  Procedure Laterality Date  . ABDOMINAL HYSTERECTOMY     1972  . BURR HOLE Bilateral 02/12/2017   Procedure: Haskell Flirt;  Surgeon: Earnie Larsson, MD;  Location: [facility];  Service: Neurosurgery;  Laterality: Bilateral;  . CERVICAL DISCECTOMY  7/07   C5-C6  . JOINT REPLACEMENT     bilateral hip  replacement  . LUMBAR DISC SURGERY     L5-S1 7/07  . LUMBAR LAMINECTOMY/DECOMPRESSION MICRODISCECTOMY N/A 02/02/2016   Procedure: L4-5 Decompression;  Surgeon: Marybelle Killings, MD;  Location: [facility];  Service: Orthopedics;  Laterality: N/A;  . [place] for ovarian cancer     OB History   No obstetric history on file.      Home Medications    Prior to Admission medications   Medication Sig Start Date End Date Taking? Authorizing Provider  acetaminophen (TYLENOL) 500 MG tablet Take 500 mg by mouth every 8 (eight) hours as needed.    [provider]  allopurinol (ZYLOPRIM) 300 MG tablet Take 1 tablet (300 mg total) by mouth daily. 04/01/18   Vivi Barrack, MD  amLODipine (NORVASC) 10 MG tablet Take 1 tablet (10 mg total) by mouth every other day. 04/30/18   Vivi Barrack, MD  ascorbic acid (VITAMIN C) 500 MG tablet Take 500 mg by mouth daily.    [provider]  atorvastatin (LIPITOR) 40 MG tablet TAKE 1 TABLET BY MOUTH EVERY DAY 06/17/18   Vivi Barrack, MD  buPROPion (WELLBUTRIN XL) 300 MG 24 hr tablet Take 1 tablet (300 mg total) by mouth every morning. 11/28/18   Arfeen, Arlyce Harman, MD  diclofenac sodium (VOLTAREN) 1 % GEL APPLY 4 GRAMS TOPICALLY FOUR TIMES A DAY 08/19/18   Vivi Barrack, MD  fluticasone [facility]) 50 MCG/ACT nasal spray INSTILL 2 SPRAYS IN EACH NOSTRIL DAILY 11/01/18   Vivi Barrack, MD  haloperidol (HALDOL) 0.5 MG tablet Take 1 tablet (0.5 mg total) by mouth at bedtime. 11/28/18 11/28/19  Arfeen, Arlyce Harman, MD  hydrochlorothiazide (HYDRODIURIL) 12.5 MG tablet Take 1 tablet (12.5 mg total) by mouth daily. Patient not taking: Reported on 11/28/2018 08/02/17   Vivi Barrack, MD  hydrochlorothiazide (HYDRODIURIL) 12.5 MG tablet TAKE ONE TABLET BY MOUTH EVERY DAY Patient not taking: Reported on 11/28/2018 06/20/18   Vivi Barrack, MD  hydrOXYzine (VISTARIL) 25 MG capsule Take 1 capsule (25 mg total) by mouth at bedtime. 11/28/18   Arfeen, Arlyce Harman, MD   lactobacillus (FLORANEX/LACTINEX) PACK Take 1 packet (1 g total) by mouth 3 (three) times daily with meals. 11/20/17   Vivi Barrack, MD  losartan (COZAAR) 100 MG tablet TAKE ONE TABLET BY MOUTH DAILY 01/01/18   Vivi Barrack, MD  memantine [facility] TITRATION PAK) tablet pack 5 mg/day for =1 week; 5 mg twice daily for =1 week; 15 mg/day given in 5 mg and 10 mg separated doses for =1 week; then 10 mg twice daily 04/29/18   Ward Givens, NP  Multiple Vitamins-Minerals (ALIVE WOMENS ENERGY) TABS Take 1 tablet by mouth daily.     [provider]  pantoprazole (PROTONIX) 40 MG tablet TAKE 1 TABLET BY MOUTH TWICE DAILY BEFORE A MEAL 04/01/18   Vivi Barrack, MD  potassium chloride (MICRO-K) 10 MEQ CR capsule Take 1 capsule (10 mEq total) by mouth daily. 04/01/18   Vivi Barrack, MD  pramoxine-hydrocortisone (PROCTOCREAM-HC) 1-1 % rectal cream Place 1 application rectally 2 (two) times daily. 03/13/18   Clifton James, MD  traMADol Veatrice Bourbon) 50 MG tablet TAKE 1 TABLET BY MOUTH TWICE DAILY AS NEEDED 07/23/18   Vivi Barrack, MD    Family History Family History  Problem Relation Age of Onset  . Diabetes Mother   . Heart disease Mother   . Hearing loss Mother   . Stroke Mother   . Post-traumatic stress disorder Father   . Schizophrenia Maternal Grandmother   . Diabetes Maternal Grandfather   . Other Brother        brother died in mental health hospital  . Kidney cancer Neg Hx   . Bladder Cancer Neg Hx     Social History Social History   Tobacco Use  . Smoking status: Former Smoker    Packs/day: 0.10    Years: 35.00    Pack years: 3.50    Types: Cigarettes    Last attempt to quit: 04/08/2013    Years since quitting: 5.6  . Smokeless tobacco: Never Used  . Tobacco comment: smoked a few when her son died in 2013-02-11   Substance Use Topics  . Alcohol use: No    Alcohol/week: 0.0 standard drinks  . Drug use: No     Allergies   Aspirin and  Sulfamethoxazole-trimethoprim   Review of Systems Review of Systems  Constitutional: Negative for chills and fever.  Respiratory: Negative for shortness of breath.   Cardiovascular: Negative for chest pain.  Gastrointestinal: Negative for abdominal pain, nausea and vomiting.  Psychiatric/Behavioral: Negative for suicidal ideas.  All other systems reviewed and are negative.    Physical Exam Updated Vital Signs BP (!) 169/75 (BP Location: Right Arm)   Pulse 69   Temp 98.5 F (36.9 C) (Oral)   Resp 16   SpO2 100%   Physical Exam Vitals signs and nursing note reviewed.  Constitutional:      General: She is not in acute distress.    Appearance: She is well-developed.  HENT:     Head: Normocephalic and atraumatic.  Eyes:     General:        Right eye: No discharge.        Left eye: No discharge.     Conjunctiva/sclera: Conjunctivae normal.  Neck:     Vascular: No JVD.     Trachea: No tracheal deviation.  Cardiovascular:     Rate and Rhythm: Normal rate and regular rhythm.     Comments: Trace pitting edema of the bilateral lower extremities Pulmonary:     Effort: Pulmonary effort is normal.     Breath sounds: Normal breath sounds.  Abdominal:     General: Abdomen is flat. There is no distension.     Tenderness: There is no abdominal tenderness. There is no guarding or rebound.  Skin:    General: Skin is warm and dry.     Findings: No erythema.  Neurological:     General: No focal deficit present.     Mental Status: She is alert.     Comments: Fluent speech, no facial droop.  Cranial nerves appear to be grossly intact.  Moving all 4 extremities spontaneously without difficulty  Psychiatric:        Attention and Perception: She is inattentive.        Mood and Affect: Mood normal.        Speech: Speech is rapid and pressured and tangential.  Behavior: Behavior is cooperative.        Thought Content: Thought content is paranoid.        Cognition and Memory:  Cognition is impaired. Memory is impaired.      ED Treatments / Results  Labs (all labs ordered are listed, but only abnormal results are displayed) Labs Reviewed  COMPREHENSIVE METABOLIC PANEL - Abnormal; Notable for the following components:      Result Value   Chloride 112 (*)    Glucose, Bld 105 (*)    Creatinine, Ser 1.13 (*)    GFR calc non Af Amer 47 (*)    GFR calc Af Amer 54 (*)    All other components within normal limits  ACETAMINOPHEN LEVEL - Abnormal; Notable for the following components:   Acetaminophen (Tylenol), Serum <10 (*)    All other components within normal limits  ETHANOL  SALICYLATE LEVEL  CBC  RAPID URINE DRUG SCREEN, HOSP PERFORMED  URINALYSIS, ROUTINE W REFLEX MICROSCOPIC    EKG EKG Interpretation  Date/Time:  Tuesday December 03 2018 14:17:28 EST Ventricular Rate:  63 PR Interval:  168 QRS Duration: 80 QT Interval:  420 QTC Calculation: 429 R Axis:   6 Text Interpretation:  Normal sinus rhythm Normal ECG since last tracing no significant change Confirmed by Noemi Chapel [phone]) on 12/03/2018 2:27:48 PM   Radiology Dg Chest Portable 1 View  Result Date: 12/03/2018 CLINICAL DATA:  Psychiatry clearance. EXAM: PORTABLE CHEST 1 VIEW COMPARISON:  04/06/2017 and prior radiographs FINDINGS: Cardiomegaly and interstitial prominence again noted. There is no evidence of focal airspace disease, pulmonary edema, suspicious pulmonary nodule/mass, pleural effusion, or pneumothorax. No acute bony abnormalities are identified. Cervical spine surgical hardware again noted. IMPRESSION: Cardiomegaly without evidence of acute cardiopulmonary disease. Electronically Signed   By: Margarette Canada M.D.   On: 12/03/2018 13:31    Procedures Procedures (including critical care time)  Medications Ordered in ED Medications - No data to display   Initial Impression / Assessment and Plan / ED Course  I have reviewed the triage vital signs and the nursing  notes.  Pertinent labs & imaging results that were available during my care of the patient were reviewed by me and considered in my medical decision making (see chart for details).     Patient presenting for evaluation under involuntary commitment.  She is afebrile, somewhat hypertensive in the ED but overall well-appearing.  No focal neurologic deficits.  Physical examination is reassuring.  She appears pleasantly psychotic exhibiting flight of ideas and tangential thinking.  She denies any suicidal or homicidal ideation to me.  She does voice her distaste for her social workers. Screening labs reviewed by myself are at patient's baseline.  TTS has seen and evaluated the patient and recommend discharge home under the care of her daughter and follow-up with her psychiatrist.  She has a competency hearing coming up later this month.  She does not appear to be an imminent threat to herself or others at this time.  Her daughter has vouched that there are no weapons in her home.   3:24 PM Care signed out to Dr. Sabra Heck.  Awaiting UA to rule out UTI.  If unremarkable, stable for discharge home without antibiotics.  If any evidence of UTI, sample should be cultured and patient can be sent home with antibiotics for treatment. Final Clinical Impressions(s) / ED Diagnoses   Final diagnoses:  Involuntary commitment    ED Discharge Orders    None  Renita Papa, PA-C 12/03/18 1524    Noemi Chapel, MD 12/03/18 [phone]

## 2018-12-03 NOTE — ED Notes (Signed)
IVC papers rescinded by Dr Sabra Heck - copy faxed to [facility], copy sent to Medical Records, and original placed in folder for [employer]. Daughter and pt voiced understanding of d/c instructions.

## 2018-12-03 NOTE — ED Notes (Addendum)
Pt voided in bathroom - states forgot to give urine specimen. Daughter remains at bedside and voiced understanding and agreement w/tx plan - psych cleared and will be d/c'd to home.

## 2018-12-03 NOTE — ED Notes (Signed)
Copy of IVC papers and Competency Hearing scheduled for 12/23/2018 faxed to [facility]. Daughter has arrived.

## 2018-12-03 NOTE — ED Notes (Signed)
Encouraged pt to void - states will attempt in a few minutes.
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
## 2018-12-03 NOTE — Discharge Instructions (Addendum)
You may return to the hospital for any severe or worsening symptoms, otherwise see your doctor in the next week for recheck

## 2018-12-03 NOTE — ED Triage Notes (Signed)
Pt presents for evaluation of IVC commitment related to suicidal and homicidal threats. Called social worker and left voicemail stating she had gun she would use to harm if taken out of home. Pt appears to have dementia, daughter has filed for paperwork to be legal guardian.

## 2018-12-05 ENCOUNTER — Other Ambulatory Visit: Payer: Self-pay

## 2018-12-05 ENCOUNTER — Emergency Department (HOSPITAL_COMMUNITY): Payer: Medicare Other

## 2018-12-05 ENCOUNTER — Emergency Department (HOSPITAL_COMMUNITY)
Admission: EM | Admit: 2018-12-05 | Discharge: 2018-12-06 | Disposition: A | Discharge status: Home / Self Care | Payer: Medicare Other | Attending: Emergency Medicine | Admitting: Emergency Medicine

## 2018-12-05 ENCOUNTER — Encounter (HOSPITAL_COMMUNITY): Payer: Self-pay

## 2018-12-05 DIAGNOSIS — S0990XA Unspecified injury of head, initial encounter: Secondary | ICD-10-CM | POA: Diagnosis present

## 2018-12-05 DIAGNOSIS — N183 Chronic kidney disease, stage 3 (moderate): Secondary | ICD-10-CM | POA: Insufficient documentation

## 2018-12-05 DIAGNOSIS — I5032 Chronic diastolic (congestive) heart failure: Secondary | ICD-10-CM | POA: Diagnosis not present

## 2018-12-05 DIAGNOSIS — M47812 Spondylosis without myelopathy or radiculopathy, cervical region: Secondary | ICD-10-CM | POA: Insufficient documentation

## 2018-12-05 DIAGNOSIS — Y999 Unspecified external cause status: Secondary | ICD-10-CM | POA: Diagnosis not present

## 2018-12-05 DIAGNOSIS — W19XXXA Unspecified fall, initial encounter: Secondary | ICD-10-CM

## 2018-12-05 DIAGNOSIS — Y92009 Unspecified place in unspecified non-institutional (private) residence as the place of occurrence of the external cause: Secondary | ICD-10-CM | POA: Diagnosis not present

## 2018-12-05 DIAGNOSIS — I130 Hypertensive heart and chronic kidney disease with heart failure and stage 1 through stage 4 chronic kidney disease, or unspecified chronic kidney disease: Secondary | ICD-10-CM | POA: Insufficient documentation

## 2018-12-05 DIAGNOSIS — S0083XA Contusion of other part of head, initial encounter: Secondary | ICD-10-CM | POA: Diagnosis not present

## 2018-12-05 DIAGNOSIS — Z96643 Presence of artificial hip joint, bilateral: Secondary | ICD-10-CM | POA: Insufficient documentation

## 2018-12-05 DIAGNOSIS — Z79899 Other long term (current) drug therapy: Secondary | ICD-10-CM | POA: Insufficient documentation

## 2018-12-05 DIAGNOSIS — Z87891 Personal history of nicotine dependence: Secondary | ICD-10-CM | POA: Diagnosis not present

## 2018-12-05 DIAGNOSIS — Y939 Activity, unspecified: Secondary | ICD-10-CM | POA: Diagnosis not present

## 2018-12-05 DIAGNOSIS — Z8543 Personal history of malignant neoplasm of ovary: Secondary | ICD-10-CM | POA: Insufficient documentation

## 2018-12-05 DIAGNOSIS — W1789XA Other fall from one level to another, initial encounter: Secondary | ICD-10-CM | POA: Insufficient documentation

## 2018-12-05 LAB — URINALYSIS, ROUTINE W REFLEX MICROSCOPIC
Bilirubin Urine: NEGATIVE
Glucose, UA: NEGATIVE mg/dL
Hgb urine dipstick: NEGATIVE
Ketones, ur: NEGATIVE mg/dL
Leukocytes, UA: NEGATIVE
Nitrite: NEGATIVE
Protein, ur: 100 mg/dL — AB
Specific Gravity, Urine: 1.015 (ref 1.005–1.030)
pH: 5 (ref 5.0–8.0)

## 2018-12-05 NOTE — ED Provider Notes (Signed)
[facility] EMERGENCY DEPARTMENT Provider Note   CSN: [id number] Arrival date & time: 12/05/18  2135     History   Chief Complaint Chief Complaint  Patient presents with  . Fall    HPI [name] is a 78 y.o. female.  HPI   She reports that she was at home tonight when she fell striking her forehead.  She was able to get up with help.  He had been ambulating with her walker, prior to the fall.  She denies a prodrome.  She denies other injuries.  She states she was arguing with her son tonight.  Evaluated in the emergency department.  She denies fever, chills, cough, shortness of breath or dizziness.  There are no other known modifying factors.  Past Medical History:  Diagnosis Date  . Chronic venous insufficiency   . Colon, diverticulosis Sept. 2011   outpatient colonoscopy by Dr. Cristina Gong.  Need record  . DDD (degenerative disc disease), lumbosacral   . Depression   . DJD (degenerative joint disease), multiple sites    Low back pain worst  . GERD (gastroesophageal reflux disease)   . Gout   . Gout    Uric Acid level 4.2 on 300 allopurinol  . History of cervical cancer 1972  . Hx-TIA (transient ischemic attack) 05/13/2001   Right facial numbness  . Hyperlipidemia   . Hypertension   . Memory disorder 12/20/2016  . Mitral valve prolapse 10/27/1999   Subsequent 2D echo show normal mitral valve  . Osteoarthritis of hip    bilateral hips  . Ovarian cancer ([place]) 1972   S/P oophorectomy  . Psychosis ([place])   . Recurrent boils    History of MRSA skin infections with abscess  . Sensorineural hearing loss of both ears   . Subdural hematoma ([place]) 01/26/2017   bilateral    Patient Active Problem List   Diagnosis Date Noted  . Keloid 08/01/2018  . Hyperglycemia 05/06/2018  . Dyslipidemia 04/30/2018  . Constipation 04/30/2018  . Osteoarthritis 01/31/2018  . Uncomplicated opioid dependence ([place]) 01/31/2018  . Lower extremity edema 07/31/2017  .  History of bilateral hip replacements   . Memory disorder 12/20/2016  . History of lumbar laminectomy for spinal cord decompression 02/02/2016  . Atopic dermatitis 07/09/2015  . Reactive airway disease 08/11/2014  . History of cancer 08/11/2014  . OSA (obstructive sleep apnea) 08/11/2014  . Chronic kidney disease (CKD), stage III (moderate) ([place]) 10/04/2012  . Schizophrenia, chronic condition ([place]) 01/20/2012  . Postmenopausal atrophic vaginitis 11/01/2010  . Chronic diastolic heart failure ([place]) 05/06/2010  . Allergic rhinitis 10/14/2009  . Obesity, Class II, BMI 35-39.9, with comorbidity 05/14/2009  . Chronic prescription opiate use 04/29/2008  . Normocytic anemia 09/25/2007  . Hyperlipidemia 09/03/2006  . Gout 09/03/2006  . Essential hypertension 09/03/2006  . GERD 09/03/2006  . Hx-TIA (transient ischemic attack) 05/13/2001    Past Surgical History:  Procedure Laterality Date  . ABDOMINAL HYSTERECTOMY     1972  . BURR HOLE Bilateral 02/12/2017   Procedure: Haskell Flirt;  Surgeon: Earnie Larsson, MD;  Location: [facility];  Service: Neurosurgery;  Laterality: Bilateral;  . CERVICAL DISCECTOMY  7/07   C5-C6  . JOINT REPLACEMENT     bilateral hip replacement  . LUMBAR DISC SURGERY     L5-S1 7/07  . LUMBAR LAMINECTOMY/DECOMPRESSION MICRODISCECTOMY N/A 02/02/2016   Procedure: L4-5 Decompression;  Surgeon: Marybelle Killings, MD;  Location: [facility];  Service: Orthopedics;  Laterality: N/A;  .  OOPHORECTOMY     1972 for ovarian cancer     OB History   No obstetric history on file.      Home Medications    Prior to Admission medications   Medication Sig Start Date End Date Taking? Authorizing Provider  acetaminophen (TYLENOL) 500 MG tablet Take 500 mg by mouth every 8 (eight) hours as needed.    [provider]  allopurinol (ZYLOPRIM) 300 MG tablet Take 1 tablet (300 mg total) by mouth daily. 04/01/18   Vivi Barrack, MD  amLODipine (NORVASC) 10 MG tablet Take 1 tablet (10 mg total)  by mouth every other day. 04/30/18   Vivi Barrack, MD  ascorbic acid (VITAMIN C) 500 MG tablet Take 500 mg by mouth daily.    [provider]  atorvastatin (LIPITOR) 40 MG tablet TAKE 1 TABLET BY MOUTH EVERY DAY 06/17/18   Vivi Barrack, MD  buPROPion (WELLBUTRIN XL) 300 MG 24 hr tablet Take 1 tablet (300 mg total) by mouth every morning. 11/28/18   Arfeen, Arlyce Harman, MD  diclofenac sodium (VOLTAREN) 1 % GEL APPLY 4 GRAMS TOPICALLY FOUR TIMES A DAY 08/19/18   Vivi Barrack, MD  fluticasone [facility]) 50 MCG/ACT nasal spray INSTILL 2 SPRAYS IN EACH NOSTRIL DAILY 11/01/18   Vivi Barrack, MD  haloperidol (HALDOL) 0.5 MG tablet Take 1 tablet (0.5 mg total) by mouth at bedtime. 11/28/18 11/28/19  Arfeen, Arlyce Harman, MD  hydrochlorothiazide (HYDRODIURIL) 12.5 MG tablet Take 1 tablet (12.5 mg total) by mouth daily. Patient not taking: Reported on 11/28/2018 08/02/17   Vivi Barrack, MD  hydrochlorothiazide (HYDRODIURIL) 12.5 MG tablet TAKE ONE TABLET BY MOUTH EVERY DAY Patient not taking: Reported on 11/28/2018 06/20/18   Vivi Barrack, MD  hydrOXYzine (VISTARIL) 25 MG capsule Take 1 capsule (25 mg total) by mouth at bedtime. 11/28/18   Arfeen, Arlyce Harman, MD  lactobacillus (FLORANEX/LACTINEX) PACK Take 1 packet (1 g total) by mouth 3 (three) times daily with meals. 11/20/17   Vivi Barrack, MD  losartan (COZAAR) 100 MG tablet TAKE ONE TABLET BY MOUTH DAILY 01/01/18   Vivi Barrack, MD  memantine [facility] [place] TITRATION PAK) tablet pack 5 mg/day for =1 week; 5 mg twice daily for =1 week; 15 mg/day given in 5 mg and 10 mg separated doses for =1 week; then 10 mg twice daily 04/29/18   Ward Givens, NP  Multiple Vitamins-Minerals (ALIVE WOMENS ENERGY) TABS Take 1 tablet by mouth daily.     [provider]  pantoprazole (PROTONIX) 40 MG tablet TAKE 1 TABLET BY MOUTH TWICE DAILY BEFORE A MEAL 04/01/18   Vivi Barrack, MD  potassium chloride (MICRO-K) 10 MEQ CR capsule Take 1 capsule (10 mEq total) by mouth  daily. 04/01/18   Vivi Barrack, MD  pramoxine-hydrocortisone (PROCTOCREAM-HC) 1-1 % rectal cream Place 1 application rectally 2 (two) times daily. 03/13/18   Clifton James, MD  traMADol Veatrice [place]) 50 MG tablet TAKE 1 TABLET BY MOUTH TWICE DAILY AS NEEDED 07/23/18   Vivi Barrack, MD    Family History Family History  Problem Relation Age of Onset  . Diabetes Mother   . Heart disease Mother   . Hearing loss Mother   . Stroke Mother   . Post-traumatic stress disorder Father   . Schizophrenia Maternal Grandmother   . Diabetes Maternal Grandfather   . Other Brother        brother died in mental health [facility]  . Kidney  cancer Neg Hx   . Bladder Cancer Neg Hx     Social History Social History   Tobacco Use  . Smoking status: Former Smoker    Packs/day: 0.10    Years: 35.00    Pack years: 3.50    Types: Cigarettes    Last attempt to quit: 04/08/2013    Years since quitting: 5.6  . Smokeless tobacco: Never Used  . Tobacco comment: smoked a few when her son died in 02/01/2013   Substance Use Topics  . Alcohol use: No    Alcohol/week: 0.0 standard drinks  . Drug use: No     Allergies   Aspirin and Sulfamethoxazole-trimethoprim   Review of Systems Review of Systems  All other systems reviewed and are negative.    Physical Exam Updated Vital Signs BP (!) 157/57   Pulse 70   SpO2 99%   Physical Exam Vitals signs and nursing note reviewed.  Constitutional:      General: She is not in acute distress.    Appearance: She is well-developed. She is obese. She is not ill-appearing or diaphoretic.  HENT:     Head: Normocephalic and atraumatic.     Comments: Right forehead contusion without crepitation, or abrasion/laceration.  No midface tenderness or crepitation.  No trismus.    Right Ear: External ear normal.     Left Ear: External ear normal.     Mouth/Throat:     Pharynx: No oropharyngeal exudate or posterior oropharyngeal erythema.  Eyes:     Conjunctiva/sclera:  Conjunctivae normal.     Pupils: Pupils are equal, round, and reactive to light.  Neck:     Musculoskeletal: Normal range of motion and neck supple.     Trachea: Phonation normal.  Cardiovascular:     Rate and Rhythm: Normal rate and regular rhythm.     Heart sounds: Normal heart sounds.  Pulmonary:     Effort: Pulmonary effort is normal. No respiratory distress.     Breath sounds: Normal breath sounds. No stridor.  Chest:     Chest wall: No tenderness.  Abdominal:     General: There is no distension.     Palpations: Abdomen is soft.     Tenderness: There is no abdominal tenderness. There is no guarding.  Musculoskeletal: Normal range of motion.     Comments: Tender right thumb but no deformity and normal range of motion.  Skin:    General: Skin is warm and dry.  Neurological:     Mental Status: She is alert and oriented to person, place, and time.     Cranial Nerves: No cranial nerve deficit.     Sensory: No sensory deficit.     Motor: No abnormal muscle tone.     Coordination: Coordination normal.  Psychiatric:        Behavior: Behavior normal.        Thought Content: Thought content normal.        Judgment: Judgment normal.     Comments: Anxious, somewhat delusional, stating that she is "going to die soon."      ED Treatments / Results  Labs (all labs ordered are listed, but only abnormal results are displayed) Labs Reviewed  URINALYSIS, ROUTINE W REFLEX MICROSCOPIC - Abnormal; Notable for the following components:      Result Value   Protein, ur 100 (*)    Bacteria, UA RARE (*)    All other components within normal limits    EKG None  Radiology Ct Head  Wo Contrast  Result Date: 12/05/2018 CLINICAL DATA:  79 year old female status post fall at home. EXAM: CT HEAD WITHOUT CONTRAST CT CERVICAL SPINE WITHOUT CONTRAST TECHNIQUE: Multidetector CT imaging of the head and cervical spine was performed following the standard protocol without intravenous contrast.  Multiplanar CT image reconstructions of the cervical spine were also generated. COMPARISON:  Head CT 04/06/2017 and earlier. Brain MRI 01/25/2017. CTA neck 04/25/2006 FINDINGS: CT HEAD FINDINGS Brain: Bilateral subdural hematomas seen in 2018. That on the right has resolved. There is minimal low-density left side subdural collection, 3-4 millimeters in thickness. No intracranial mass effect or midline shift. Cerebral volume has regressed since 2018. No hyperdense intracranial hemorrhage. Patchy bilateral cerebral white matter hypodensity has progressed. No cortically based acute infarct identified. Vascular: Calcified atherosclerosis at the skull base. No suspicious intracranial vascular hyperdensity. Skull: Previous bifrontal burr holes. No acute osseous abnormality identified. Sinuses/Orbits: Paranasal Visualized paranasal sinuses and mastoids are stable and well pneumatized. Other: Right forehead scalp hematoma measuring up to 9 millimeters in thickness. Underlying right frontal bone appears intact. No other acute scalp or orbits soft tissue findings. CT CERVICAL SPINE FINDINGS Alignment: Stable straightening of cervical lordosis since 2007. There is subtle anterolisthesis of C7 on T1, but the facets at that level are ankylosed. Skull base and vertebrae: Visualized skull base is intact. No atlanto-occipital dissociation. No cervical spine fracture identified. There is a stable linear nutrient foramen of the right C2 lamina on series 14, image 29. Soft tissues and spinal canal: No prevertebral fluid or swelling. No visible canal hematoma. Partially retropharyngeal course of both carotids. Negative noncontrast neck soft tissues. Disc levels: Previous C3-C4 and C4-C5 ACDF with solid arthrodesis. Superimposed chronic C2-C3 posterior element and interbody ankylosis. Superimposed degenerative appearing ankylosis of C6 through T1. Superimposed bulky disc and endplate degeneration at C5-C6, although evidence of  developing posterior interspinous ankylosis also at that level. Superimposed moderate to severe right side C1-C2 joint space loss. Partially calcified degenerative ligamentous hypertrophy about the odontoid. Upper chest: Visible upper thoracic levels appear grossly intact. Stable lung apices. IMPRESSION: 1. Right forehead scalp hematoma without underlying fracture. 2. No acute traumatic injury to the brain identified. Resolved right side and nearly resolved left side subdural hematomas since 2018. Residual 4 mm left-side low-density SDH with no associated intracranial mass effect. Progressed cerebral white matter disease since 2018, probably small vessel related. 3.  No acute traumatic injury identified in the cervical spine. 4. Widespread cervical and upper thoracic spine ankylosis, including related to prior C3-C4 and C4-C5 ACDF. Advanced cervical spine degeneration at the unfused levels. Electronically Signed   By: Genevie Ann M.D.   On: 12/05/2018 23:21   Ct Cervical Spine Wo Contrast  Result Date: 12/05/2018 CLINICAL DATA:  78 year old female status post fall at home. EXAM: CT HEAD WITHOUT CONTRAST CT CERVICAL SPINE WITHOUT CONTRAST TECHNIQUE: Multidetector CT imaging of the head and cervical spine was performed following the standard protocol without intravenous contrast. Multiplanar CT image reconstructions of the cervical spine were also generated. COMPARISON:  Head CT 04/06/2017 and earlier. Brain MRI 01/25/2017. CTA neck 04/25/2006 FINDINGS: CT HEAD FINDINGS Brain: Bilateral subdural hematomas seen in 2018. That on the right has resolved. There is minimal low-density left side subdural collection, 3-4 millimeters in thickness. No intracranial mass effect or midline shift. Cerebral volume has regressed since 2018. No hyperdense intracranial hemorrhage. Patchy bilateral cerebral white matter hypodensity has progressed. No cortically based acute infarct identified. Vascular: Calcified atherosclerosis at the  skull base. No suspicious  intracranial vascular hyperdensity. Skull: Previous bifrontal burr holes. No acute osseous abnormality identified. Sinuses/Orbits: Paranasal Visualized paranasal sinuses and mastoids are stable and well pneumatized. Other: Right forehead scalp hematoma measuring up to 9 millimeters in thickness. Underlying right frontal bone appears intact. No other acute scalp or orbits soft tissue findings. CT CERVICAL SPINE FINDINGS Alignment: Stable straightening of cervical lordosis since 2007. There is subtle anterolisthesis of C7 on T1, but the facets at that level are ankylosed. Skull base and vertebrae: Visualized skull base is intact. No atlanto-occipital dissociation. No cervical spine fracture identified. There is a stable linear nutrient foramen of the right C2 lamina on series 14, image 29. Soft tissues and spinal canal: No prevertebral fluid or swelling. No visible canal hematoma. Partially retropharyngeal course of both carotids. Negative noncontrast neck soft tissues. Disc levels: Previous C3-C4 and C4-C5 ACDF with solid arthrodesis. Superimposed chronic C2-C3 posterior element and interbody ankylosis. Superimposed degenerative appearing ankylosis of C6 through T1. Superimposed bulky disc and endplate degeneration at C5-C6, although evidence of developing posterior interspinous ankylosis also at that level. Superimposed moderate to severe right side C1-C2 joint space loss. Partially calcified degenerative ligamentous hypertrophy about the odontoid. Upper chest: Visible upper thoracic levels appear grossly intact. Stable lung apices. IMPRESSION: 1. Right forehead scalp hematoma without underlying fracture. 2. No acute traumatic injury to the brain identified. Resolved right side and nearly resolved left side subdural hematomas since 2018. Residual 4 mm left-side low-density SDH with no associated intracranial mass effect. Progressed cerebral white matter disease since 2018, probably small  vessel related. 3.  No acute traumatic injury identified in the cervical spine. 4. Widespread cervical and upper thoracic spine ankylosis, including related to prior C3-C4 and C4-C5 ACDF. Advanced cervical spine degeneration at the unfused levels. Electronically Signed   By: Genevie Ann M.D.   On: 12/05/2018 23:21    Procedures Procedures (including critical care time)  Medications Ordered in ED Medications - No data to display   Initial Impression / Assessment and Plan / ED Course  I have reviewed the triage vital signs and the nursing notes.  Pertinent labs & imaging results that were available during my care of the patient were reviewed by me and considered in my medical decision making (see chart for details).  Clinical Course as of Dec 05 2328  Thu Dec 05, 2018  2325 Normal  Urinalysis, Routine w reflex microscopic(!) [EW]  2327 Old subdural, no acute traumatic injuries, images reviewed by me  CT Head Wo Contrast [EW]  2328 DJD, no fractures or dislocation, images reviewed by me  CT Cervical Spine Wo Contrast [EW]    Clinical Course User Index [EW] Daleen Bo, MD     Patient Vitals for the past 24 hrs:  BP Pulse SpO2  12/05/18 2203 (!) 157/57 70 99 %  12/05/18 2158 - - 98 %    11:29 PM Reevaluation with update and discussion. After initial assessment and treatment, an updated evaluation reveals no change in clinical status.  Findings discussed with the patient and all questions were answered. Daleen Bo   Medical Decision Making: Fall likely mechanical without serious injuries.  [place] for metabolic and infectious processes with urinalysis is reassuring.  No sign of acute bacterial infection, metabolic instability or suggestion for impending vascular collapse.  CRITICAL CARE-no Performed by: Daleen Bo  Nursing Notes Reviewed/ Care Coordinated Applicable Imaging Reviewed Interpretation of Laboratory Data incorporated into ED treatment  The patient appears  reasonably screened and/or stabilized for discharge and  I doubt any other medical condition or other [facility] requiring further screening, evaluation, or treatment in the ED at this time prior to discharge.  Plan: Home Medications-continue usual medications; Home Treatments-rest, fluids, gradually advance activity; return here if the recommended treatment, does not improve the symptoms; Recommended follow up-PCP follow-up as needed.   Final Clinical Impressions(s) / ED Diagnoses   Final diagnoses:  Fall, initial encounter  Injury of head, initial encounter  Osteoarthritis of cervical spine, unspecified spinal osteoarthritis complication status    ED Discharge Orders    None       Daleen Bo, MD 12/05/18 2330

## 2018-12-05 NOTE — ED Triage Notes (Signed)
Per EMS pt was at home standing up, fell hit forehead. Pt does not know what made her fall. Pt has significant psych history.  Pt walks with front wheel walker and cannot ambulate without it

## 2018-12-05 NOTE — Discharge Instructions (Addendum)
There were no serious injuries from the fall today.  Use Tylenol if needed for pain.  Follow-up with your doctor as needed for problems.

## 2018-12-06 ENCOUNTER — Telehealth: Payer: Self-pay | Admitting: Family Medicine

## 2018-12-06 NOTE — Telephone Encounter (Signed)
Left voices message for daughter to call clinic. MRI request,please advise.

## 2018-12-06 NOTE — Telephone Encounter (Signed)
Copied from [place] [id number]. Topic: General - Other >> Dec 06, 2018  9:42 AM Yvette Rack wrote: Reason for CRM: Pt daughter [name] stated pt fell last night and bumped her head. [name] stated pt was taken to the ED due to a huge knot on her forehead and she would like Dr. [name] to order an MRI. Cb# [phone]

## 2018-12-06 NOTE — Telephone Encounter (Signed)
Patient's daughter returning call, requesting a call back.

## 2018-12-06 NOTE — Telephone Encounter (Signed)
See note

## 2018-12-09 NOTE — Telephone Encounter (Signed)
Please advise 

## 2018-12-09 NOTE — Telephone Encounter (Signed)
Left daughter a message stating we need more details on why she is requesting a MRI. CRM Created.

## 2018-12-09 NOTE — Telephone Encounter (Signed)
See note

## 2018-12-09 NOTE — Telephone Encounter (Signed)
Pt's daughter calling back - states pt fell in 2018 and had a CT done that stated everything was okay.  At a MRI scheduled 2 weeks later it showed she had blood on her brain. Pt's daughter wants the MRI scheduled 2 weeks from fall (December 05, 2018) to make sure there is no pressure on her brain.  States that pt took a terrible fall and has a huge knot on the front of her face. [name], daughter, can be reached at [phone]

## 2018-12-09 NOTE — Telephone Encounter (Signed)
Head Ct should be sufficient to monitor for any signs of bleeding. Recommend that they come in for OV prior to ordering any other imaging studies to make sure that we are doing a medically necessary test.  [name]. [name], MD 12/09/2018 3:55 PM

## 2018-12-09 NOTE — Telephone Encounter (Signed)
Can you call and get more info? Looks like ED ordered head CT and sent her home.  Algis Greenhouse. [name], MD 12/09/2018 8:06 AM

## 2018-12-10 NOTE — Telephone Encounter (Signed)
Patient daughter scheduled appt.

## 2018-12-13 ENCOUNTER — Other Ambulatory Visit: Payer: Self-pay | Admitting: Family Medicine

## 2018-12-13 DIAGNOSIS — M545 Low back pain, unspecified: Secondary | ICD-10-CM

## 2018-12-17 NOTE — Telephone Encounter (Signed)
Ok with refills

## 2018-12-17 NOTE — Telephone Encounter (Signed)
Please advise 

## 2018-12-23 ENCOUNTER — Encounter: Payer: Self-pay | Admitting: Family Medicine

## 2018-12-23 ENCOUNTER — Ambulatory Visit (INDEPENDENT_AMBULATORY_CARE_PROVIDER_SITE_OTHER): Payer: Medicare Other

## 2018-12-23 ENCOUNTER — Ambulatory Visit (INDEPENDENT_AMBULATORY_CARE_PROVIDER_SITE_OTHER): Payer: Medicare Other | Admitting: Family Medicine

## 2018-12-23 VITALS — BP 142/66 | HR 77 | Temp 98.5°F | Ht 62.0 in | Wt 211.0 lb

## 2018-12-23 DIAGNOSIS — M79644 Pain in right finger(s): Secondary | ICD-10-CM

## 2018-12-23 DIAGNOSIS — I10 Essential (primary) hypertension: Secondary | ICD-10-CM

## 2018-12-23 DIAGNOSIS — H539 Unspecified visual disturbance: Secondary | ICD-10-CM | POA: Diagnosis not present

## 2018-12-23 DIAGNOSIS — I5032 Chronic diastolic (congestive) heart failure: Secondary | ICD-10-CM

## 2018-12-23 DIAGNOSIS — W19XXXD Unspecified fall, subsequent encounter: Secondary | ICD-10-CM

## 2018-12-23 DIAGNOSIS — E785 Hyperlipidemia, unspecified: Secondary | ICD-10-CM

## 2018-12-23 NOTE — Progress Notes (Signed)
Chief Complaint:  [name] is a 78 y.o. female who presents today with a chief complaint of fall follow up.  History is provided by patient's daughter.  Assessment/Plan:  Fall / Vision Changes It is difficult to obtain accurate history due to her underlying schizophrenia.  No significant abnormalities on exam, however she reports some right-sided visual deficits.  Given her recent fall, will obtain MRI to rule out any other potential intracranial abnormalities.  Discussed reasons to seek emergent care.  No clear etiology for her fall.  Likely multifactorial in setting of inconsistent medication use and deconditioning.  She refuses PT referral.  Now that her daughter is applying for guardianship, will no longer have issues with medication adherence.  Right thumb pain Plain film with significant amount of arthritis and questionable fracture.  Placed in thumb spica splint.  Follow-up with sports medicine in 1 to 2 weeks.  Hyperlipidemia Stable.  Continue atorvastatin 40 mg daily.  Check lipid panel next blood draw.  Essential hypertension Her systolic is a little high however her diastolic is low at 66.  Continue current regimen.  Continue home blood pressure monitoring.  Follow-up with me in 6 months.  Chronic diastolic heart failure (HCC) Stable.  No signs of overload.     Subjective:  HPI:  Recent Fall / Vision changes / Thumb Pain Patient unfortunately suffered a fall 2 weeks ago and was subsequently transported to the ED.  There she had work-up including CT head/C-spine which was negative for any acute process and she was sent home.  Over the last 1 to 2 weeks, she has had persistent vision loss in her right eye.  No other weakness or numbness.   She has not had any falls over the last 2 weeks.  Her daughter with her today states that she thinks the patient was not taking her medications appropriately and she is not sure if she was either off all of her blood pressure  medications or not taking her psychotropic medications.  Patient recently "kicked out" her other family members and have been living alone, however 2 days ago her daughter moved back in.  She is currently trying to get guardianship over her mother.  She has also noticed increasing pain to the base of her right thumb over the last 2 weeks.  She thinks that she injured it during the fall.  She has some difficulty moving the thumb.  She has tried ice to the area but no other treatments.  Her stable, chronic medical conditions are outlined below:  # Hypertension - On amlodipine 10 mg every other day, HCTZ 12.5 mg daily, and losartan 100 mg daily. tolerating well without side effects.  - ROS: No reported chest pain or shortness of breath.   # Dyslipidemia - On liptor 40mg  daily and tolerating well - ROS: No myalgias  % GAD / Paranoid Schizophrenia - Follows with Dr Adele Schilder  ROS: Per HPI  PMH: She reports that she quit smoking about 5 years ago. Her smoking use included cigarettes. She has a 3.50 pack-year smoking history. She has never used smokeless tobacco. She reports that she does not drink alcohol or use drugs.      Objective:  Physical Exam: BP (!) 142/66 (BP Location: Left Arm, Patient Position: Sitting, Cuff Size: Large)   Pulse 77   Temp 98.5 F (36.9 C) (Oral)   Ht 5\' 2"  (1.575 m)   Wt 211 lb (95.7 kg)   SpO2 97%   BMI  38.59 kg/m   Gen: NAD, resting comfortably CV: Regular rate and rhythm with no murmurs appreciated Pulm: Normal work of breathing, clear to auscultation bilaterally with no crackles, wheezes, or rhonchi GI: Normal bowel sounds present. Soft, Nontender, Nondistended. MSK:  - Hand: Right thumb grossly edematous.  Very limited range of motion at the MCP joint.  Full range of motion at interphalangeal joint. Skin: Warm, dry Neuro: Limited due to mental status. CN2-12 intact. Strength 5/5 in upper and lower extremities.  Psych: Normal affect.  Frequent non  sequiturs.  Tangential speech and thought content.      Algis Greenhouse. [name], MD 12/23/2018 12:21 PM

## 2018-12-23 NOTE — Assessment & Plan Note (Signed)
Stable.  Continue atorvastatin 40 mg daily.  Check lipid panel next blood draw.

## 2018-12-23 NOTE — Progress Notes (Signed)
Please inform patient of the following:  Radiology reviewed her xray and did not find any other findings. Recommend splint for 2 weeks and then follow up with Dr Paulla Fore as we discussed.  [name]. [name], MD 12/23/2018 4:23 PM

## 2018-12-23 NOTE — Assessment & Plan Note (Signed)
Stable.  No signs of overload.

## 2018-12-23 NOTE — Patient Instructions (Addendum)
It was very nice to see you today!  Please use the splint for the next 2 weeks. Follow up with Dr Paulla Fore in 2 weeks.   We will check an MRI to make sure there is nothing else going on.   No other changes today.  Come back see me in 6 months or sooner as needed.   Take care, Dr [name]

## 2018-12-23 NOTE — Assessment & Plan Note (Signed)
Her systolic is a little high however her diastolic is low at 66.  Continue current regimen.  Continue home blood pressure monitoring.  Follow-up with me in 6 months.

## 2018-12-30 ENCOUNTER — Other Ambulatory Visit: Payer: Self-pay | Admitting: Family Medicine

## 2018-12-30 DIAGNOSIS — H539 Unspecified visual disturbance: Secondary | ICD-10-CM

## 2019-01-02 ENCOUNTER — Ambulatory Visit: Payer: Medicare Other | Admitting: Family Medicine

## 2019-01-05 ENCOUNTER — Ambulatory Visit
Admission: RE | Admit: 2019-01-05 | Discharge: 2019-01-05 | Disposition: A | Discharge status: Home / Self Care | Payer: Medicare Other | Source: Ambulatory Visit | Attending: Family Medicine | Admitting: Family Medicine

## 2019-01-05 DIAGNOSIS — H539 Unspecified visual disturbance: Secondary | ICD-10-CM

## 2019-01-06 ENCOUNTER — Ambulatory Visit (INDEPENDENT_AMBULATORY_CARE_PROVIDER_SITE_OTHER): Payer: Medicare Other | Admitting: Sports Medicine

## 2019-01-06 ENCOUNTER — Ambulatory Visit (INDEPENDENT_AMBULATORY_CARE_PROVIDER_SITE_OTHER): Payer: Medicare Other

## 2019-01-06 ENCOUNTER — Encounter: Payer: Self-pay | Admitting: Sports Medicine

## 2019-01-06 VITALS — BP 138/74 | HR 69 | Ht 62.0 in | Wt 213.2 lb

## 2019-01-06 DIAGNOSIS — M19041 Primary osteoarthritis, right hand: Secondary | ICD-10-CM | POA: Diagnosis not present

## 2019-01-06 DIAGNOSIS — M79644 Pain in right finger(s): Secondary | ICD-10-CM | POA: Insufficient documentation

## 2019-01-06 DIAGNOSIS — S62524A Nondisplaced fracture of distal phalanx of right thumb, initial encounter for closed fracture: Secondary | ICD-10-CM | POA: Diagnosis not present

## 2019-01-06 NOTE — Progress Notes (Signed)
Please inform patient of the following:  Her MRI shows the old bleeds from last year that are decreasing in size. She has no new areas of bleeding. Do not need to do further testing at this point. Would like for her to let us know if her symptoms are not improving.  [name]. [name], MD 01/06/2019 9:33 AM

## 2019-01-06 NOTE — Progress Notes (Signed)
[name]. [name], [place] at [facility] [phone]  [name] - 78 y.o. female MRN [id number]  Date of birth: 03-Jan-1941  Visit Date: January 09, 2019  PCP: Shelby Barrack, MD   Referred by: Shelby Barrack, MD  SUBJECTIVE:  Chief Complaint  Patient presents with  . Right Hand - Initial Assessment  . Follow-up    R thumb pain. Thumb spica splint from [employer].    HPI: Patient presents after an injury on February 6 where she fell landing directly on her right thumb.  She has been in a thumb spica splint for this and has been having good improvement in her symptoms.  She has significant underlying osteoarthritis but feels that this is different.  She has been using Tylenol, tramadol and Voltaren with moderate improvement.  The thumb spica splint that she is in does provide her good comfort and immobilization of her thumb.  She is coming out of it from time to time.  REVIEW OF SYSTEMS: No significant nighttime awakenings due to this issue. Denies fevers, chills, recent weight gain or weight loss.  No night sweats.  Otherwise 12 point review of systems is reviewed and negative other than prior history of ovarian cancer 1970 and the fall as above.  HISTORY:  Prior history reviewed and updated per electronic medical record.  Patient Active Problem List   Diagnosis Date Noted  . Pain of right thumb 01/06/2019     R thumb XR -  12/23/18   . Hyperglycemia 05/06/2018    Lab Results  Component Value Date   HGBA1C 6.0 04/30/2018   HGBA1C 5.7 08/03/2016   HGBA1C 6.1 06/04/2012   Lab Results  Component Value Date   MICROALBUR 0.20 06/19/2008   LDLCALC 127 (H) 04/30/2018   CREATININE 1.23 (H) 04/30/2018      . Constipation 04/30/2018  . Osteoarthritis 01/31/2018  . Lower extremity edema 07/31/2017  . History of bilateral hip replacements   . Memory disorder 12/20/2016  . History of lumbar laminectomy for spinal cord  decompression 02/02/2016  . Atopic dermatitis 07/09/2015    Eval derm 2018. OK to use fluocinolone ointment 0.05% BID with flares   . Reactive airway disease 08/11/2014    PFT's 2014 : FEV1 / FVC 85. 19% increase in FEV1 After BD. Official reading was mixed obs and restrictive lung dz. Former smoker.  PFT's 2016 : nl FEV1/FVC, insig response to BD, decreased diff capacity c/w pul vascular process, rec repeating with exercise.    [name] History of cancer 08/11/2014    Ovarian 2972 - s/p oophorectomy (no original records) H/O cervical cancer (no original records)   . OSA (obstructive sleep apnea) 08/11/2014    2011 : Sleep study mild OSA with AHI 7. No titration was done   . Chronic kidney disease (CKD), stage III (moderate) ([place]) 10/04/2012  . Schizophrenia, chronic condition ([place]) 01/20/2012    Mgmt by [place]   . Postmenopausal atrophic vaginitis 11/01/2010    Treated with Premarin Cream   . Chronic diastolic heart failure ([place]) 05/06/2010    Last Echo in 5/11 with EF of 65-70% with Grade 2 diastolic dysfunction.   . Allergic rhinitis 10/14/2009        . Obesity, Class II, BMI 35-39.9, with comorbidity 05/14/2009    Qualifier: Diagnosis of  By: Volanda Napoleon MD, Barnetta Chapel     . Chronic prescription opiate use 04/29/2008    2017 : Progressive  spinal stenosis L4-5, now with neurogenic claudication. Dr Ricard Dillon discussing surgery Feb 19, 2011 : Left total hip arthroplasty 2007-02-19 : epidural shots left L4-L5 2006/02/18 : C5-C6 Diskectomy 2005/02/18 : L THR  02-19-03 : R hip arthroplasty       . Normocytic anemia 09/25/2007    Normocytic. Baseline HgB about 11. Prior anemia panels.     . Hyperlipidemia 09/03/2006    Lab Results  Component Value Date   CHOL 201 (H) 04/30/2018   HDL 50.50 04/30/2018   LDLCALC 127 (H) 04/30/2018   TRIG 118.0 04/30/2018   CHOLHDL 4 04/30/2018     . Gout 09/03/2006    No known crystal dx. Describes podagra. On allopurinol    . Essential hypertension  09/03/2006        . GERD 09/03/2006    Chronic PPI     Social History   Occupational History  . Occupation: Retired  Tobacco Use  . Smoking status: Former Smoker    Packs/day: 0.10    Years: 35.00    Pack years: 3.50    Types: Cigarettes    Last attempt to quit: 04/08/2013    Years since quitting: 5.7  . Smokeless tobacco: Never Used  . Tobacco comment: smoked a few when her son died in 2013-02-18   Substance and Sexual Activity  . Alcohol use: No    Alcohol/week: 0.0 standard drinks  . Drug use: No  . Sexual activity: Never   Social History   Social History Narrative   Lives with daughter in [place]. Daughter is primary caretaker, surrogate decision-maker, and arranges for [facility] aide and other caregivers to supervise [name] at home. Ambulatory   Past Medical History:  Diagnosis Date  . Chronic venous insufficiency   . Colon, diverticulosis Sept. 2011   outpatient colonoscopy by Dr. Cristina Gong.  Need record  . DDD (degenerative disc disease), lumbosacral   . Depression   . DJD (degenerative joint disease), multiple sites    Low back pain worst  . GERD (gastroesophageal reflux disease)   . Gout   . Gout    Uric Acid level 4.2 on 300 allopurinol  . History of cervical cancer 02-19-71  . Hx-TIA (transient ischemic attack) 05/13/2001   Right facial numbness  . Hx-TIA (transient ischemic attack) 05/13/2001   Looking back in Epic, the patient had a hospitalization in July 2002 for evaluation of questionable TIA (s/s of right facial numbness associated with mild blurred vision in her right eye, diaphoresis, and mild confusion) with negative CT head and negative MRI/MRA and was started on Plavix after being heparinized. She was to have possible follow up with Neurology for reevaluation of the need for Pl  . Hyperlipidemia   . Hypertension   . Keloid 08/01/2018  . Memory disorder 12/20/2016  . Mitral valve prolapse 10/27/1999   Subsequent 2D echo show normal mitral valve  .  Osteoarthritis of hip    bilateral hips  . Ovarian cancer ([place]) 02/19/71   S/P oophorectomy  . Psychosis ([place])   . Recurrent boils    History of MRSA skin infections with abscess  . Sensorineural hearing loss of both ears   . Subdural hematoma ([place]) 01/26/2017   bilateral  . Uncomplicated opioid dependence ([place]) 01/31/2018   Past Surgical History:  Procedure Laterality Date  . ABDOMINAL HYSTERECTOMY     1972  . BURR HOLE Bilateral 02/12/2017   Procedure: Haskell Flirt;  Surgeon: Earnie Larsson, MD;  Location: [facility];  Service: Neurosurgery;  Laterality: Bilateral;  . CERVICAL DISCECTOMY  7/07   C5-C6  . JOINT REPLACEMENT     bilateral hip replacement  . LUMBAR DISC SURGERY     L5-S1 7/07  . LUMBAR LAMINECTOMY/DECOMPRESSION MICRODISCECTOMY N/A 02/02/2016   Procedure: L4-5 Decompression;  Surgeon: Marybelle Killings, MD;  Location: [facility];  Service: Orthopedics;  Laterality: N/A;  . OOPHORECTOMY     1972 for ovarian cancer   family history includes Diabetes in her maternal grandfather and mother; Hearing loss in her mother; Heart disease in her mother; Other in her brother; Post-traumatic stress disorder in her father; Schizophrenia in her maternal grandmother; Stroke in her mother. There is no history of Kidney cancer or Bladder Cancer.  OBJECTIVE:  VS:  HT:5\' 2"  (157.5 cm)   WT:213 lb 3.2 oz (96.7 kg)  BMI:38.98    BP:138/74  HR:69bpm  TEMP: ( )  RESP:95 %   PHYSICAL EXAM: CONSTITUTIONAL: Well-developed, Well-nourished and In no acute distress EYES: Pupils are equal., EOM intact without nystagmus. and No scleral icterus. Psychiatric: Alert & appropriately interactive. and Not depressed or anxious appearing. EXTREMITY EXAM: Warm and well perfused  Right hand has significant generalized osteoarthritic bossing of the MCP and IP joints.  There is focal tenderness directly over the distal phalanx of the thumb.  She has some flexion extension that is minimal.  No significant pain with nailbed  pressure.  Good capillary refill.  X-rays reviewed today that do show a minimally displaced transverse fracture of the distal phalanx of the thumb.  Marked degenerative changes otherwise.     ASSESSMENT:  1. Closed nondisplaced fracture of distal phalanx of right thumb, initial encounter   2. Pain of right thumb   3. Primary osteoarthritis of right hand     PROCEDURES:  None  PLAN:  Pertinent additional documentation may be included in corresponding procedure notes, imaging studies, problem based documentation and patient instructions.  No problem-specific Assessment & Plan notes found for this encounter.   She is being mobilized quite well in the thumb spica splint at this time.  We discussed that this should continue to heal well but she has to avoid any type of recurrent injury.  She is not interested in any other type of immobilization at this time and we discussed this will take 6 to 8 weeks minimum to heal.  We will plan to follow-up with her for repeat x-rays     Imaging Orders     DG Finger Thumb Right Referral Orders  No referral(s) requested today      Return in about 2 weeks (around 01/20/2019).          Gerda Diss, [place] [facility] Physician

## 2019-01-10 ENCOUNTER — Other Ambulatory Visit: Payer: Self-pay | Admitting: Family Medicine

## 2019-01-10 ENCOUNTER — Other Ambulatory Visit (HOSPITAL_COMMUNITY): Payer: Self-pay | Admitting: Psychiatry

## 2019-01-10 DIAGNOSIS — F411 Generalized anxiety disorder: Secondary | ICD-10-CM

## 2019-01-10 DIAGNOSIS — F200 Paranoid schizophrenia: Secondary | ICD-10-CM

## 2019-01-23 ENCOUNTER — Ambulatory Visit: Payer: Medicare Other | Admitting: Sports Medicine

## 2019-02-14 ENCOUNTER — Encounter: Payer: Self-pay | Admitting: Family Medicine

## 2019-02-17 ENCOUNTER — Ambulatory Visit: Payer: Medicare Other | Admitting: Sports Medicine

## 2019-02-17 ENCOUNTER — Ambulatory Visit (INDEPENDENT_AMBULATORY_CARE_PROVIDER_SITE_OTHER): Payer: Medicare Other | Admitting: Sports Medicine

## 2019-02-17 ENCOUNTER — Encounter: Payer: Self-pay | Admitting: Sports Medicine

## 2019-02-17 VITALS — BP 146/61 | Ht 62.0 in | Wt 216.4 lb

## 2019-02-17 DIAGNOSIS — S62524A Nondisplaced fracture of distal phalanx of right thumb, initial encounter for closed fracture: Secondary | ICD-10-CM

## 2019-02-17 DIAGNOSIS — M79644 Pain in right finger(s): Secondary | ICD-10-CM

## 2019-02-17 NOTE — Progress Notes (Signed)
[name]. [place], [place] at [place] - 78 y.o. female MRN [id number]  Date of birth: 1940-11-09  Visit Date: 02/17/2019  PCP: Vivi Barrack, MD   Referred by: Vivi Barrack, MD   Virtual Visit via Video  I connected with [name] on 02/20/19 at  1:00 PM EDT by a video enabled telemedicine application and verified that I am speaking with the correct person using two identifiers. Location patient: Home Location provider: Provider office Persons participating in the virtual visit: Patient and Daughter  I discussed the limitations of evaluation and management by telemedicine and the availability of in person appointments. The patient expressed understanding and agreed to proceed.   SUBJECTIVE:   Chief Complaint  Patient presents with  . Follow-up    R thumb.  XR on 01/06/19.  Tramadol and Voltaren.  Has a thumb spica brace    HPI: Patient reports that she is doing well and has no pain in her thumb.  It is reportedly feeling close to 100%.  She is occasionally using Voltaren gel and tramadol and wears a thumb spica every day but only intermittently.  REVIEW OF SYSTEMS: No significant nighttime awakenings due to this issue. Denies fevers, chills, recent weight gain or weight loss.  No night sweats.  Pt denies any change in bowel or bladder habits, muscle weakness, numbness or falls associated with this pain.  HISTORY:  Prior history reviewed and updated per electronic medical record.  Patient Active Problem List   Diagnosis Date Noted  . Pain of right thumb 01/06/2019     R thumb XR -  12/23/18   . Hyperglycemia 05/06/2018    Lab Results  Component Value Date   HGBA1C 6.0 04/30/2018   HGBA1C 5.7 08/03/2016   HGBA1C 6.1 06/04/2012   Lab Results  Component Value Date   MICROALBUR 0.20 06/19/2008   LDLCALC 127 (H) 04/30/2018   CREATININE 1.23 (H) 04/30/2018      . Constipation  04/30/2018  . Osteoarthritis 01/31/2018  . Lower extremity edema 07/31/2017  . History of bilateral hip replacements   . Memory disorder 12/20/2016  . History of lumbar laminectomy for spinal cord decompression 02/02/2016  . Atopic dermatitis 07/09/2015    Eval derm 2018. OK to use fluocinolone ointment 0.05% BID with flares   . Reactive airway disease 08/11/2014    PFT's 2014 : FEV1 / FVC 85. 19% increase in FEV1 After BD. Official reading was mixed obs and restrictive lung dz. Former smoker.  PFT's 2016 : nl FEV1/FVC, insig response to BD, decreased diff capacity c/w pul vascular process, rec repeating with exercise.    [name] History of cancer 08/11/2014    Ovarian 2972 - s/p oophorectomy (no original records) H/O cervical cancer (no original records)   . OSA (obstructive sleep apnea) 08/11/2014    2011 : Sleep study mild OSA with AHI 7. No titration was done   . Chronic kidney disease (CKD), stage III (moderate) ([place]) 10/04/2012  . Schizophrenia, chronic condition ([place]) 01/20/2012    Mgmt by [place]   . Postmenopausal atrophic vaginitis 11/01/2010    Treated with Premarin Cream   . Chronic diastolic heart failure ([place]) 05/06/2010    Last Echo in 5/11 with EF of 65-70% with Grade 2 diastolic dysfunction.   . Allergic rhinitis 10/14/2009        . Obesity, Class II, BMI 35-39.9, with comorbidity 05/14/2009  Qualifier: Diagnosis of  By: Volanda Napoleon MD, Barnetta Chapel     . Chronic prescription opiate use 04/29/2008    02-16-16 : Progressive spinal stenosis L4-5, now with neurogenic claudication. Dr Ricard Dillon discussing surgery 2011-02-16 : Left total hip arthroplasty 02/16/2007 : epidural shots left L4-L5 2006-02-15 : C5-C6 Diskectomy 2005-02-15 : L THR  February 16, 2003 : R hip arthroplasty       . Normocytic anemia 09/25/2007    Normocytic. Baseline HgB about 11. Prior anemia panels.     . Hyperlipidemia 09/03/2006    Lab Results  Component Value Date   CHOL 201 (H) 04/30/2018   HDL 50.50 04/30/2018    LDLCALC 127 (H) 04/30/2018   TRIG 118.0 04/30/2018   CHOLHDL 4 04/30/2018     . Gout 09/03/2006    No known crystal dx. Describes podagra. On allopurinol    . Essential hypertension 09/03/2006        . GERD 09/03/2006    Chronic PPI     Social History   Occupational History  . Occupation: Retired  Tobacco Use  . Smoking status: Former Smoker    Packs/day: 0.10    Years: 35.00    Pack years: 3.50    Types: Cigarettes    Last attempt to quit: 04/08/2013    Years since quitting: 5.8  . Smokeless tobacco: Never Used  . Tobacco comment: smoked a few when her son died in 02-15-2013   Substance and Sexual Activity  . Alcohol use: No    Alcohol/week: 0.0 standard drinks  . Drug use: No  . Sexual activity: Never   Social History   Social History Narrative   Lives with daughter in [place]. Daughter is primary caretaker, surrogate decision-maker, and arranges for [facility] aide and other caregivers to supervise Ms. Sako at home. Ambulatory     OBJECTIVE:  VS:  HT:5\' 2"  (157.5 cm)   WT:216 lb 6.4 oz (98.2 kg)(Taken at home due to virtual visit)  BMI:39.57    BP:(!) 146/61(Taken at home due to virtual visit)  HR:(Not taken due to virtual visit)bpm  TEMP: ( )  RESP:(Not taken due to virtual visit)   PHYSICAL EXAM: GENERAL: Alert, appears well and in no acute distress. HEENT: Atraumatic, conjunctiva clear, no obvious abnormalities on inspection of external nose and ears. NECK: Normal movements of the head and neck. CARDIOPULMONARY: No increased WOB. Speaking in clear sentences. I:E ratio WNL.  MS: Moves all visible extremities without noticeable abnormality. PSYCH: Pleasant and cooperative, well-groomed. Speech normal rate and rhythm. Affect is appropriate. Insight and judgement are appropriate. Attention is focused, linear, and appropriate.  NEURO: CN grossly intact. Oriented as arrived to appointment on time with no prompting. Moves both UE equally.  SKIN: No obvious  lesions, wounds, erythema, or cyanosis noted on face or hands.    ASSESSMENT:   1. Pain of right thumb   2. Closed nondisplaced fracture of distal phalanx of right thumb, initial encounter   3. Thumb pain, right     PROCEDURES:  None  PLAN:  Pertinent additional documentation may be included in corresponding procedure notes, imaging studies, problem based documentation and patient instructions.  No problem-specific Assessment & Plan notes found for this encounter.   Overall she is doing better and essentially has no pain at this time.  Given the good improvement and the current COVID-19 crisis and low likelihood of prolonged issues with this fracture further diagnostic evaluation and management is no longer warranted but I did review the red flags  including worsening symptoms and pain.  She does have underlying significant arthritis of the hand and will likely have some stiffness and discomfort going forward and she recognizes and understands this.  Activity modifications and the importance of avoiding exacerbating activities (limiting pain to no more than a 4 / 10 during or following activity) recommended and discussed.   Discussed red flag symptoms that warrant earlier emergent evaluation and patient voices understanding.   No orders of the defined types were placed in this encounter.  Lab Orders  No laboratory test(s) ordered today   Imaging Orders  No imaging studies ordered today   Referral Orders  No referral(s) requested today     Return if symptoms worsen or fail to improve.          Gerda Diss, [place] [facility] Physician

## 2019-02-20 ENCOUNTER — Encounter: Payer: Self-pay | Admitting: Sports Medicine

## 2019-02-27 ENCOUNTER — Ambulatory Visit (INDEPENDENT_AMBULATORY_CARE_PROVIDER_SITE_OTHER): Payer: Medicare Other | Admitting: Psychiatry

## 2019-02-27 ENCOUNTER — Other Ambulatory Visit: Payer: Self-pay

## 2019-02-27 ENCOUNTER — Encounter (HOSPITAL_COMMUNITY): Payer: Self-pay | Admitting: Psychiatry

## 2019-02-27 DIAGNOSIS — F411 Generalized anxiety disorder: Secondary | ICD-10-CM

## 2019-02-27 DIAGNOSIS — F200 Paranoid schizophrenia: Secondary | ICD-10-CM | POA: Diagnosis not present

## 2019-02-27 MED ORDER — HYDROXYZINE PAMOATE 25 MG PO CAPS: 25.0000 mg | ORAL_CAPSULE | Freq: Every day | ORAL | 0 refills | Status: DC

## 2019-02-27 MED ORDER — HALOPERIDOL 0.5 MG PO TABS: ORAL_TABLET | ORAL | 0 refills | Status: DC

## 2019-02-27 MED ORDER — BUPROPION HCL ER (XL) 300 MG PO TB24: 300.0000 mg | ORAL_TABLET | Freq: Every morning | ORAL | 0 refills | Status: DC
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
## 2019-03-11 ENCOUNTER — Other Ambulatory Visit (HOSPITAL_COMMUNITY): Payer: Self-pay | Admitting: Psychiatry

## 2019-03-11 ENCOUNTER — Other Ambulatory Visit: Payer: Self-pay | Admitting: Family Medicine

## 2019-03-11 DIAGNOSIS — F200 Paranoid schizophrenia: Secondary | ICD-10-CM

## 2019-03-11 DIAGNOSIS — M545 Low back pain, unspecified: Secondary | ICD-10-CM

## 2019-03-12 NOTE — Telephone Encounter (Signed)
Rx request 

## 2019-03-13 ENCOUNTER — Other Ambulatory Visit: Payer: Self-pay | Admitting: Family Medicine

## 2019-03-13 DIAGNOSIS — M545 Low back pain, unspecified: Secondary | ICD-10-CM

## 2019-03-13 NOTE — Progress Notes (Signed)
Tried to send tramadol rx in electronically however pharmacy not set up.

## 2019-03-13 NOTE — Progress Notes (Signed)
Rx has been faxed to pharmacy.

## 2019-03-19 ENCOUNTER — Telehealth: Payer: Self-pay

## 2019-03-19 ENCOUNTER — Other Ambulatory Visit: Payer: Self-pay

## 2019-03-19 NOTE — Telephone Encounter (Signed)
This encounter was created in error - please disregard.

## 2019-03-19 NOTE — Telephone Encounter (Signed)
Spoke with patient daughter she stated patient is feeling well.She stated patient is calling due to her mental illness.She is aware that patient feet are swollen and that Dr. [name] is aware,she refused to Lasix and elevate her feet.If patient needs to be seen her daughter will give [place] a call.

## 2019-03-19 NOTE — Telephone Encounter (Signed)
Patient call stated feet are swollen.Abdominal and back pain x 3 months.Patient stated she is having SOB and sweats at night.Patient stated she is not able to wash herself in the shower.She stated her son cooks and cleans for her.She needs a home health aide to help her with showers.

## 2019-03-20 NOTE — Telephone Encounter (Signed)
Noted.  Algis Greenhouse. [name], MD 03/20/2019 10:13 AM

## 2019-04-01 ENCOUNTER — Other Ambulatory Visit (HOSPITAL_COMMUNITY): Payer: Self-pay | Admitting: Psychiatry

## 2019-04-01 DIAGNOSIS — F200 Paranoid schizophrenia: Secondary | ICD-10-CM

## 2019-04-07 ENCOUNTER — Other Ambulatory Visit: Payer: Self-pay | Admitting: Family Medicine

## 2019-04-10 ENCOUNTER — Other Ambulatory Visit (HOSPITAL_COMMUNITY): Payer: Self-pay

## 2019-04-10 DIAGNOSIS — F200 Paranoid schizophrenia: Secondary | ICD-10-CM

## 2019-04-10 MED ORDER — HALOPERIDOL 0.5 MG PO TABS: ORAL_TABLET | ORAL | 0 refills | Status: DC

## 2019-05-05 ENCOUNTER — Other Ambulatory Visit (HOSPITAL_COMMUNITY): Payer: Self-pay | Admitting: Psychiatry

## 2019-05-05 DIAGNOSIS — F200 Paranoid schizophrenia: Secondary | ICD-10-CM

## 2019-05-29 ENCOUNTER — Ambulatory Visit (INDEPENDENT_AMBULATORY_CARE_PROVIDER_SITE_OTHER): Payer: Medicare Other | Admitting: Psychiatry

## 2019-05-29 ENCOUNTER — Other Ambulatory Visit: Payer: Self-pay

## 2019-05-29 ENCOUNTER — Encounter (HOSPITAL_COMMUNITY): Payer: Self-pay | Admitting: Psychiatry

## 2019-05-29 DIAGNOSIS — F200 Paranoid schizophrenia: Secondary | ICD-10-CM | POA: Diagnosis not present

## 2019-05-29 DIAGNOSIS — F411 Generalized anxiety disorder: Secondary | ICD-10-CM

## 2019-05-29 DIAGNOSIS — F09 Unspecified mental disorder due to known physiological condition: Secondary | ICD-10-CM | POA: Diagnosis not present

## 2019-05-29 MED ORDER — HYDROXYZINE PAMOATE 25 MG PO CAPS: 25.0000 mg | ORAL_CAPSULE | Freq: Every day | ORAL | 0 refills | Status: DC

## 2019-05-29 MED ORDER — BUPROPION HCL ER (XL) 300 MG PO TB24: 300.0000 mg | ORAL_TABLET | Freq: Every morning | ORAL | 0 refills | Status: DC

## 2019-05-29 MED ORDER — HALOPERIDOL 0.5 MG PO TABS: ORAL_TABLET | ORAL | 0 refills | Status: DC
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
## 2019-06-02 ENCOUNTER — Other Ambulatory Visit: Payer: Self-pay | Admitting: Family Medicine

## 2019-06-02 ENCOUNTER — Other Ambulatory Visit (HOSPITAL_COMMUNITY): Payer: Self-pay | Admitting: Psychiatry

## 2019-06-02 DIAGNOSIS — M545 Low back pain, unspecified: Secondary | ICD-10-CM

## 2019-06-02 DIAGNOSIS — F200 Paranoid schizophrenia: Secondary | ICD-10-CM

## 2019-06-03 NOTE — Telephone Encounter (Signed)
Rx request 

## 2019-06-13 ENCOUNTER — Telehealth: Payer: Self-pay | Admitting: Family Medicine

## 2019-06-13 ENCOUNTER — Ambulatory Visit (INDEPENDENT_AMBULATORY_CARE_PROVIDER_SITE_OTHER): Payer: Medicare Other | Admitting: Internal Medicine

## 2019-06-13 ENCOUNTER — Encounter: Payer: Self-pay | Admitting: Internal Medicine

## 2019-06-13 ENCOUNTER — Ambulatory Visit: Payer: Medicare Other | Admitting: Physician Assistant

## 2019-06-13 ENCOUNTER — Other Ambulatory Visit: Payer: Self-pay

## 2019-06-13 VITALS — BP 150/68 | HR 68 | Temp 99.2°F | Ht 62.0 in | Wt 231.0 lb

## 2019-06-13 DIAGNOSIS — N61 Mastitis without abscess: Secondary | ICD-10-CM | POA: Insufficient documentation

## 2019-06-13 MED ORDER — FLUCONAZOLE 150 MG PO TABS: 150.0000 mg | ORAL_TABLET | Freq: Once | ORAL | 0 refills | Status: AC

## 2019-06-13 MED ORDER — DOXYCYCLINE HYCLATE 100 MG PO TABS: 100.0000 mg | ORAL_TABLET | Freq: Two times a day (BID) | ORAL | 0 refills | Status: DC

## 2019-06-13 NOTE — Assessment & Plan Note (Signed)
Cellulitis right breast around 6:00 There is minimal drainage with pressure, but the area is not indurated, red, warm and tender Start doxycycline twice daily x10 days She will monitor the area closely and if there is no improvement or any worsening over the next 48 hours she will call

## 2019-06-13 NOTE — Patient Instructions (Addendum)
You have an infection - start the antibiotic today and complete the entire course.  It is ok to take tylenol or your tramadol for pain.    Monitor the area closely and if it is not improving please call.

## 2019-06-13 NOTE — Telephone Encounter (Signed)
I have reviewed triage notes from last night -- giving "hole in the breast", ongoing drainage and mental crisis noted by the triage nurse last night, coupled with patient's longstanding dementia and diagnosis of paranoid schizophrenia I would recommend she be evaluated at ER setting as packing may be needed. Otherwise would defer to her PCP if he wants to see her for this.

## 2019-06-13 NOTE — Telephone Encounter (Signed)
Patient is scheduled to see Dr. Quay Burow at 2:45 this afternoon.

## 2019-06-13 NOTE — Progress Notes (Signed)
Subjective:    Patient ID: [name], female    DOB: 05-Nov-1940, 78 y.o.   MRN: [id number]  HPI The patient is here for an acute visit.   Cyst ruptured under right breast:  She noticed a cyst under her right breast two days ago.  It broke open yesterday and there has been discharge since then.  She feels like something is inside.  It is very painful.  The area is red.  She denies any fevers or chills.     Medications and allergies reviewed with patient and updated if appropriate.  Patient Active Problem List   Diagnosis Date Noted  . Pain of right thumb 01/06/2019  . Hyperglycemia 05/06/2018  . Constipation 04/30/2018  . Osteoarthritis 01/31/2018  . Lower extremity edema 07/31/2017  . History of bilateral hip replacements   . Memory disorder 12/20/2016  . History of lumbar laminectomy for spinal cord decompression 02/02/2016  . Atopic dermatitis 07/09/2015  . Reactive airway disease 08/11/2014  . History of cancer 08/11/2014  . OSA (obstructive sleep apnea) 08/11/2014  . Chronic kidney disease (CKD), stage III (moderate) ([place]) 10/04/2012  . Schizophrenia, chronic condition ([place]) 01/20/2012  . Postmenopausal atrophic vaginitis 11/01/2010  . Chronic diastolic heart failure ([place]) 05/06/2010  . Allergic rhinitis 10/14/2009  . Obesity, Class II, BMI 35-39.9, with comorbidity 05/14/2009  . Chronic prescription opiate use 04/29/2008  . Normocytic anemia 09/25/2007  . Hyperlipidemia 09/03/2006  . Gout 09/03/2006  . Essential hypertension 09/03/2006  . GERD 09/03/2006    Current Outpatient Medications on File Prior to Visit  Medication Sig Dispense Refill  . acetaminophen (TYLENOL) 500 MG tablet Take 500 mg by mouth every 8 (eight) hours as needed.    [name] allopurinol (ZYLOPRIM) 300 MG tablet TAKE 1 TABLET BY MOUTH ONCE DAILY 90 tablet 2  . amLODipine (NORVASC) 10 MG tablet TAKE 1 TABLET BY MOUTH EVERY OTHER DAY 90 tablet 2  . ascorbic acid (VITAMIN C) 500 MG tablet  Take 500 mg by mouth daily.    [name] atorvastatin (LIPITOR) 40 MG tablet TAKE 1 TABLET BY MOUTH EVERY DAY 90 tablet 3  . buPROPion (WELLBUTRIN XL) 300 MG 24 hr tablet Take 1 tablet (300 mg total) by mouth every morning. 90 tablet 0  . diclofenac sodium (VOLTAREN) 1 % GEL APPLY 4 GRAMS TOPICALLY FOUR TIMES A DAY 100 g 4  . fluticasone (FLONASE) 50 MCG/ACT nasal spray INSTILL 2 SPRAYS IN EACH NOSTRIL DAILY 16 g 10  . haloperidol (HALDOL) 0.5 MG tablet Take one tab at bed time and 2nd if needed for agitation 90 tablet 0  . hydrochlorothiazide (HYDRODIURIL) 12.5 MG tablet TAKE ONE TABLET BY MOUTH EVERY DAY 90 tablet 2  . hydrOXYzine (VISTARIL) 25 MG capsule Take 1 capsule (25 mg total) by mouth at bedtime. 90 capsule 0  . lactobacillus (FLORANEX/LACTINEX) PACK Take 1 packet (1 g total) by mouth 3 (three) times daily with meals. 90 each 11  . losartan (COZAAR) 100 MG tablet TAKE ONE TABLET BY MOUTH DAILY 30 tablet 11  . Multiple Vitamins-Minerals (ALIVE WOMENS ENERGY) TABS Take 1 tablet by mouth daily.     . pantoprazole (PROTONIX) 40 MG tablet TAKE 1 TABLET BY MOUTH TWICE DAILY BEFORE A MEAL 180 tablet 2  . potassium chloride (MICRO-K) 10 MEQ CR capsule TAKE 1 CAPSULE BY MOUTH ONCE DAILY 90 capsule 1  . pramoxine-hydrocortisone (PROCTOCREAM-HC) 1-1 % rectal cream Place 1 application rectally 2 (two) times daily. 30 g 0  .  traMADol (ULTRAM) 50 MG tablet TAKE 1 TABLET BY MOUTH 2 TIMES A DAY AS NEEDED 180 tablet 3   No current facility-administered medications on file prior to visit.     Past Medical History:  Diagnosis Date  . Chronic venous insufficiency   . Colon, diverticulosis Sept. 2011   outpatient colonoscopy by Dr. Cristina Gong.  Need record  . DDD (degenerative disc disease), lumbosacral   . Depression   . DJD (degenerative joint disease), multiple sites    Low back pain worst  . GERD (gastroesophageal reflux disease)   . Gout   . Gout    Uric Acid level 4.2 on 300 allopurinol  . History of  cervical cancer 02-10-1971  . Hx-TIA (transient ischemic attack) 05/13/2001   Right facial numbness  . Hx-TIA (transient ischemic attack) 05/13/2001   Looking back in Epic, the patient had a hospitalization in July 2002 for evaluation of questionable TIA (s/s of right facial numbness associated with mild blurred vision in her right eye, diaphoresis, and mild confusion) with negative CT head and negative MRI/MRA and was started on Plavix after being heparinized. She was to have possible follow up with Neurology for reevaluation of the need for Pl  . Hyperlipidemia   . Hypertension   . Keloid 08/01/2018  . Memory disorder 12/20/2016  . Mitral valve prolapse 10/27/1999   Subsequent 2D echo show normal mitral valve  . Osteoarthritis of hip    bilateral hips  . Ovarian cancer ([place]) Feb 10, 1971   S/P oophorectomy  . Psychosis ([place])   . Recurrent boils    History of MRSA skin infections with abscess  . Sensorineural hearing loss of both ears   . Subdural hematoma ([place]) 01/26/2017   bilateral  . Uncomplicated opioid dependence ([place]) 01/31/2018    Past Surgical History:  Procedure Laterality Date  . ABDOMINAL HYSTERECTOMY     1972  . BURR HOLE Bilateral 02/12/2017   Procedure: Haskell Flirt;  Surgeon: Earnie Larsson, MD;  Location: [facility];  Service: Neurosurgery;  Laterality: Bilateral;  . CERVICAL DISCECTOMY  7/07   C5-C6  . JOINT REPLACEMENT     bilateral hip replacement  . LUMBAR DISC SURGERY     L5-S1 7/07  . LUMBAR LAMINECTOMY/DECOMPRESSION MICRODISCECTOMY N/A 02/02/2016   Procedure: L4-5 Decompression;  Surgeon: Marybelle Killings, MD;  Location: [facility];  Service: Orthopedics;  Laterality: N/A;  . [facility] for ovarian cancer    Social History   Socioeconomic History  . Marital status: Widowed    Spouse name: Not on file  . Number of children: 3  . Years of education: 59  . Highest education level: Not on file  Occupational History  . Occupation: Retired  Scientific laboratory technician  . Financial resource  strain: Not on file  . Food insecurity    Worry: Not on file    Inability: Not on file  . Transportation needs    Medical: Not on file    Non-medical: Not on file  Tobacco Use  . Smoking status: Former Smoker    Packs/day: 0.10    Years: 35.00    Pack years: 3.50    Types: Cigarettes    Quit date: 04/08/2013    Years since quitting: 6.1  . Smokeless tobacco: Never Used  . Tobacco comment: smoked a few when her son died in 2013-02-09   Substance and Sexual Activity  . Alcohol use: No    Alcohol/week: 0.0 standard drinks  . Drug use:  No  . Sexual activity: Never  Lifestyle  . Physical activity    Days per week: Not on file    Minutes per session: Not on file  . Stress: Not on file  Relationships  . Social Herbalist on phone: Not on file    Gets together: Not on file    Attends religious service: Not on file    Active member of club or organization: Not on file    Attends meetings of clubs or organizations: Not on file    Relationship status: Not on file  Other Topics Concern  . Not on file  Social History Narrative   Lives with daughter in [place]. Daughter is primary caretaker, surrogate decision-maker, and arranges for [facility] aide and other caregivers to supervise Ms. Hitchner at home. Ambulatory    Family History  Problem Relation Age of Onset  . Diabetes Mother   . Heart disease Mother   . Hearing loss Mother   . Stroke Mother   . Post-traumatic stress disorder Father   . Schizophrenia Maternal Grandmother   . Diabetes Maternal Grandfather   . Other Brother        brother died in mental health hospital  . Kidney cancer Neg Hx   . Bladder Cancer Neg Hx     Review of Systems  Constitutional: Negative for chills and fever.  Skin: Positive for color change and wound.  Neurological: Negative for light-headedness and headaches.       Objective:   Vitals:   06/13/19 1503  BP: (!) 150/68  Pulse: 68  Temp: 99.2 F (37.3 C)  SpO2: 96%   BP Readings  from Last 3 Encounters:  06/13/19 (!) 150/68  02/17/19 (!) 146/61  01/06/19 138/74   Wt Readings from Last 3 Encounters:  06/13/19 231 lb (104.8 kg)  02/17/19 216 lb 6.4 oz (98.2 kg)  01/06/19 213 lb 3.2 oz (96.7 kg)   Body mass index is 42.25 kg/m.   Physical Exam Constitutional:      General: She is not in acute distress.    Appearance: Normal appearance. She is not ill-appearing, toxic-appearing or diaphoretic.  Skin:    General: Skin is warm and dry.     Comments: 6:00 right breast where the breast meets the chest wall there is a mandrin orange size area of erythema and induration with a very small opening in the center that has mild pussy discharge with applying pressure, area is warm and tender  Neurological:     Mental Status: She is alert.            Assessment & Plan:    See Problem List for Assessment and Plan of chronic medical problems.

## 2019-06-13 NOTE — Telephone Encounter (Signed)
Could I please schedule this pt with Dr. Elyn Aquas for an in office appt today at his 2:30pm appt time? She has a ruptured cyst under her breast with mucus coming out. No other symptoms or covid contact.

## 2019-06-13 NOTE — Telephone Encounter (Signed)
[facility] [place] RECORD [facility] Patient Name: [name] Gender: Female DOB: September 26, 1941 Age: 78 Y 9 M 24 D Return Phone Number: [phone] (Primary) Address: City/State/Zip: [place] [address] Client [facility] Client Site [facility] Night Physician Dimas Chyle- MD Contact Type Call Who Is Calling Patient / Member / Family / Caregiver Call Type Triage / Clinical Caller Name [name] Relationship To Patient Daughter Return Phone Number Please choose phone number Chief Complaint Breast Symptoms Reason for Call Symptomatic / Request for [place] states that her mother has a visible hole in her breast that has milky discharge and pain. She also is having mental health crisis. She is concerned how this happened. Translation No Nurse Assessment Nurse: Owens Shark, RN, Neoma Laming Date/Time (Eastern Time): 06/12/2019 7:54:30 PM Confirm and document reason for call. If symptomatic, describe symptoms. ---Caller states that her mother has a visible hole in her right breast that has milky discharge and pain. She noticed it today. It is a bit bigger than a pinhole. Has the patient had close contact with a person known or suspected to have the novel coronavirus illness OR traveled / lives in area with major community spread (including international travel) in the last 14 days from the onset of symptoms? * If Asymptomatic, screen for exposure and travel within the last 14 days. ---No Does the patient have any new or worsening symptoms? ---Yes Will a triage be completed? ---Yes Related visit to physician within the last 2 weeks? ---No Does the PT have any chronic conditions? (i.e. diabetes, asthma, this includes High risk factors for pregnancy, etc.) ---Yes List chronic conditions. ---Dementia, HTN Is this a behavioral health or  substance abuse call? ---No Guidelines Guideline Title Affirmed Question Affirmed Notes Nurse Date/Time Eilene Ghazi Time) Breast Symptoms Breast lump Helane Rima 06/12/2019 7:58:18 PM Disp. Time Eilene Ghazi Time) Disposition Final User PLEASE NOTE: All timestamps contained within this report are represented as [place] Standard Time. CONFIDENTIALTY NOTICE: This fax transmission is intended only for the addressee. It contains information that is legally privileged, confidential or otherwise protected from use or disclosure. If you are not the intended recipient, you are strictly prohibited from reviewing, disclosing, copying using or disseminating any of this information or taking any action in reliance on or regarding this information. If you have received this fax in error, please notify us immediately by telephone so that we can arrange for its return to [place]. Phone: [phone], Toll-Free: [phone], Fax: [phone] Page: 2 of 2 Call Id: [id number] 06/12/2019 8:12:44 PM SEE PCP WITHIN 3 DAYS Yes Owens Shark, RN, Garrel Ridgel Disagree/Comply Comply Caller Understands Yes PreDisposition Go to Urgent Care/Walk-In Clinic Care Advice Given Per Guideline SEE PCP WITHIN 3 DAYS: CALL BACK IF: CARE ADVICE given per Breast Symptoms (Adult) guideline. * Fever or redness occurs After Care Instructions Given Call Event Type User Date / Time Description Education document email Helane Rima 06/12/2019 8:11:25 PM [facility] Connect Now Instructions Comments User: Festus Aloe, RN Date/Time (Eastern Time): 06/12/2019 7:59:47 PM States the hole is hard and the drainage is foul smelling. User: Festus Aloe, RN Date/Time Eilene Ghazi Time): 06/12/2019 8:12:37 PM Caller states that she is not going to take her to UC and it is getting ready to close. She will call PCP in the morning. User: Festus Aloe, RN Date/Time Eilene Ghazi Time): 06/12/2019 8:14:16 PM Caller states her mother has dementia but  is not  having a mental health crises. She states her mother has a milky discharge from a very small "the size of a broom straw" in her right breast. Referrals REFERRED TO PCP OFFICE REFERRED TO PCP OFFICE

## 2019-06-23 ENCOUNTER — Ambulatory Visit: Payer: Self-pay | Admitting: Family Medicine

## 2019-06-27 ENCOUNTER — Other Ambulatory Visit (HOSPITAL_COMMUNITY): Payer: Self-pay | Admitting: Psychiatry

## 2019-06-27 ENCOUNTER — Ambulatory Visit (INDEPENDENT_AMBULATORY_CARE_PROVIDER_SITE_OTHER): Payer: Medicare Other | Admitting: Family Medicine

## 2019-06-27 ENCOUNTER — Other Ambulatory Visit: Payer: Self-pay

## 2019-06-27 ENCOUNTER — Encounter: Payer: Self-pay | Admitting: Family Medicine

## 2019-06-27 VITALS — BP 132/75 | HR 87 | Temp 97.8°F | Ht 62.0 in | Wt 233.0 lb

## 2019-06-27 DIAGNOSIS — I10 Essential (primary) hypertension: Secondary | ICD-10-CM

## 2019-06-27 DIAGNOSIS — S91209A Unspecified open wound of unspecified toe(s) with damage to nail, initial encounter: Secondary | ICD-10-CM

## 2019-06-27 DIAGNOSIS — L0390 Cellulitis, unspecified: Secondary | ICD-10-CM

## 2019-06-27 DIAGNOSIS — Z23 Encounter for immunization: Secondary | ICD-10-CM | POA: Diagnosis not present

## 2019-06-27 DIAGNOSIS — R05 Cough: Secondary | ICD-10-CM

## 2019-06-27 DIAGNOSIS — R739 Hyperglycemia, unspecified: Secondary | ICD-10-CM

## 2019-06-27 DIAGNOSIS — R600 Localized edema: Secondary | ICD-10-CM

## 2019-06-27 DIAGNOSIS — E785 Hyperlipidemia, unspecified: Secondary | ICD-10-CM | POA: Diagnosis not present

## 2019-06-27 DIAGNOSIS — R059 Cough, unspecified: Secondary | ICD-10-CM

## 2019-06-27 DIAGNOSIS — F200 Paranoid schizophrenia: Secondary | ICD-10-CM

## 2019-06-27 MED ORDER — DOXYCYCLINE HYCLATE 100 MG PO TABS: 100.0000 mg | ORAL_TABLET | Freq: Two times a day (BID) | ORAL | 0 refills | Status: DC

## 2019-06-27 MED ORDER — HYDROCHLOROTHIAZIDE 25 MG PO TABS: 25.0000 mg | ORAL_TABLET | Freq: Every day | ORAL | 3 refills | Status: DC

## 2019-06-27 MED ORDER — FLUCONAZOLE 150 MG PO TABS: 150.0000 mg | ORAL_TABLET | Freq: Once | ORAL | 0 refills | Status: AC

## 2019-06-27 MED ORDER — DOXYCYCLINE HYCLATE 100 MG PO TABS: 100.0000 mg | ORAL_TABLET | Freq: Two times a day (BID) | ORAL | 0 refills | Status: AC

## 2019-06-27 NOTE — Assessment & Plan Note (Signed)
Check lipid panel.  Continue atorvastatin 40 mg daily. 

## 2019-06-27 NOTE — Progress Notes (Signed)
Chief Complaint:  [name] is a 78 y.o. female who presents today with a chief complaint of cellulitis follow up.   Assessment/Plan:  Cellulitis Improving however still present.  Will extend doxycycline for another 10 days.  Also send in one-time dose of Diflucan as needed for yeast vaginitis.  Instructed patient and daughter to keep the area clean and dry.  Nail Avulsion Removed today.  Tolerated well.  See below procedure note.  Cough Check covid antibody.   Essential hypertension At goal.  Given her increased lower extreme edema, will increase HCTZ to 25 mg daily.  Will stop amlodipine.  Continue losartan 100 mg daily.  Check CBC, TSH, and C met.  Hyperlipidemia Check lipid panel.  Continue atorvastatin 40 mg daily.  Hyperglycemia Check A1c.  Check CBC, C met, and TSH.  Preventative Healthcare Flu vaccine given today.   Follow up in 6 months.     Subjective:  HPI:  She was recently seen for breast cellulitis and started on doxycycline. Improved but still has some drainage and pain    She is also had increasing lower extremity swelling.  She has been less active due to COVID-19 pandemic.  She is also had an avulsion of her left great toenail.  Daughter just noticed this this morning.  She is not sure when it happened.  She is having some pain and bleeding from the area.  Her stable, chronic medical conditions are outlined below:  # Hypertension - On amlodipine 10 mg every other day, HCTZ 12.5 mg daily, and losartan 100 mg daily. tolerating well without side effects.  - ROS: No reported chest pain or shortness of breath.   # Dyslipidemia - On liptor 44m daily and tolerating well - ROS: No myalgias  % GAD / Paranoid Schizophrenia - Follows with Dr AAdele Schilder ROS: Per HPI  PMH: She reports that she quit smoking about 6 years ago. Her smoking use included cigarettes. She has a 3.50 pack-year smoking history. She has never used smokeless tobacco. She reports  that she does not drink alcohol or use drugs.      Objective:  Physical Exam: BP 132/75    Pulse 87    Temp 97.8 F (36.6 C)    Ht _0  (1.575 m)    Wt 233 lb (105.7 kg)    SpO2 98%    BMI 42.62 kg/m   Wt Readings from Last 3 Encounters:  06/27/19 233 lb (105.7 kg)  06/13/19 231 lb (104.8 kg)  02/17/19 216 lb 6.4 oz (98.2 kg)  Gen: NAD, resting comfortably CV: Regular rate and rhythm with no murmurs appreciated Pulm: Normal work of breathing, clear to auscultation bilaterally with no crackles, wheezes, or rhonchi Breast: Approximately 2 to 3 cm indurated area on anterior aspect of right breast. MSK: No edema, cyanosis, or clubbing noted.  2+ pitting edema to knees bilaterally.  Left great toenail with partial nail avulsion. Skin: Warm, dry Neuro: Grossly normal, moves all extremities Psych: Normal affect and thought content  Toenail Avulsion Procedure Note  Pre-operative Diagnosis: Right Great Toe Partial Avulstion Post-operative Diagnosis: Same  Indications: Therapeutic  Anesthesia: Lidocaine 1% without epinephrine without added sodium bicarbonate  Procedure Details  History of allergy to iodine: no  The risks (including bleeding and infection) and benefits of the  procedure and Written informed consent obtained.  After digital block anesthesia was obtained, a tourniquet was applied for hemostasis during the procedure.  After prepping with Betadine, the offending edge  of the nail was freed from the nailbed and perionychium, and then split with scissors and removed with  forceps.  All visible granulation tissue is debrided. Antibiotic and bulky dressing was applied.   Complications: none.  Plan: 1. Soak the foot twice daily. Change dressing twice daily until healed over. 2. Warning signs of infection were reviewed.   3. Recommended that the patient use OTC analgesics as needed for pain.       Algis Greenhouse. [name], MD 06/27/2019 4:39 PM

## 2019-06-27 NOTE — Patient Instructions (Addendum)
It was very nice to see you today!  We removed your toenail today.  Please keep the bandage in place for the next 24 to 48 hours.  Please keep areas clean and dry as possible.  I will refill your antibiotic for your breast infection.  Let me know if this does not improve over the next few days.  Please stop your amlodipine.  We will increase your HCTZ to 25 mg daily.  This should help some with the swelling you are having.  Please make sure you are watching your salt state as active as possible.  We will check blood work today.  Come back to see me in 6 months, or sooner if needed.   Take care, Dr [name]  Please try these tips to maintain a healthy lifestyle:   Eat at least 3 REAL meals and 1-2 snacks per day.  Aim for no more than 5 hours between eating.  If you eat breakfast, please do so within one hour of getting up.    Obtain twice as many fruits/vegetables as protein or carbohydrate foods for both lunch and dinner. (Half of each meal should be fruits/vegetables, one quarter protein, and one quarter starchy carbs)   Cut down on sweet beverages. This includes juice, soda, and sweet tea.    Exercise at least 150 minutes every week.

## 2019-06-27 NOTE — Assessment & Plan Note (Signed)
Check A1c.  Check CBC, C met, and TSH.

## 2019-06-27 NOTE — Assessment & Plan Note (Signed)
At goal.  Given her increased lower extreme edema, will increase HCTZ to 25 mg daily.  Will stop amlodipine.  Continue losartan 100 mg daily.  Check CBC, TSH, and C met.

## 2019-06-28 LAB — LIPID PANEL
Cholesterol: 203 mg/dL — ABNORMAL HIGH (ref <200)
HDL: 46 mg/dL — ABNORMAL LOW (ref >=50)
LDL Cholesterol (Calc): 128 mg/dL (calc) — ABNORMAL HIGH
Non-HDL Cholesterol (Calc): 157 mg/dL (calc) — ABNORMAL HIGH (ref <130)
Total CHOL/HDL Ratio: 4.4 (calc) (ref <5.0)
Triglycerides: 169 mg/dL — ABNORMAL HIGH (ref <150)

## 2019-06-28 LAB — CBC
HCT: 35.7 % (ref 35.0–45.0)
Hemoglobin: 12.2 g/dL (ref 11.7–15.5)
MCH: 31.7 pg (ref 27.0–33.0)
MCHC: 34.2 g/dL (ref 32.0–36.0)
MCV: 92.7 fL (ref 80.0–100.0)
MPV: 12.3 fL (ref 7.5–12.5)
Platelets: 203 10*3/uL (ref 140–400)
RBC: 3.85 10*6/uL (ref 3.80–5.10)
RDW: 14.1 % (ref 11.0–15.0)
WBC: 9 10*3/uL (ref 3.8–10.8)

## 2019-06-28 LAB — HEMOGLOBIN A1C
Hgb A1c MFr Bld: 6 % of total Hgb — ABNORMAL HIGH (ref <5.7)
Mean Plasma Glucose: 126 (calc)
eAG (mmol/L): 7 (calc)

## 2019-06-28 LAB — TSH: TSH: 2.57 mIU/L (ref 0.40–4.50)

## 2019-06-28 LAB — SAR COV2 SEROLOGY (COVID19)AB(IGG),IA: SARS CoV2 AB IGG: NEGATIVE

## 2019-06-30 NOTE — Progress Notes (Signed)
Please inform patient of the following:  COVID antibody is negative. She has not been exposed to the virus and is still at risk for infection.  The rest of her labs are all STABLE. Her A1c is stable at 6.0 and her cholesterol levels are stable as well DO not need to make any other changes to her medications at this time. She can follow up in 6 months, or sooner if needed.  Algis Greenhouse. [name], MD 06/30/2019 7:56 AM

## 2019-07-01 ENCOUNTER — Telehealth: Payer: Self-pay

## 2019-07-01 NOTE — Telephone Encounter (Signed)
Left voice message to call clinic 

## 2019-07-01 NOTE — Telephone Encounter (Signed)
Copied from [place] [id number]. Topic: General - Other >> Jul 01, 2019 10:12 AM Leward Quan A wrote: Reason for CRM: Patient daughter [name] "[name]" [name] called for [facility] stated that the was calling back from a message that was left. Please call Ms [name] at Ph# [phone]

## 2019-07-02 IMAGING — DX RIGHT THUMB 2+V
3 series · 3 of 3 positions shown · non-contrast
Comparison: 12/23/2018

CLINICAL DATA: Right thumb pain after falling.

EXAM:
RIGHT THUMB 2+V

[finger pa]
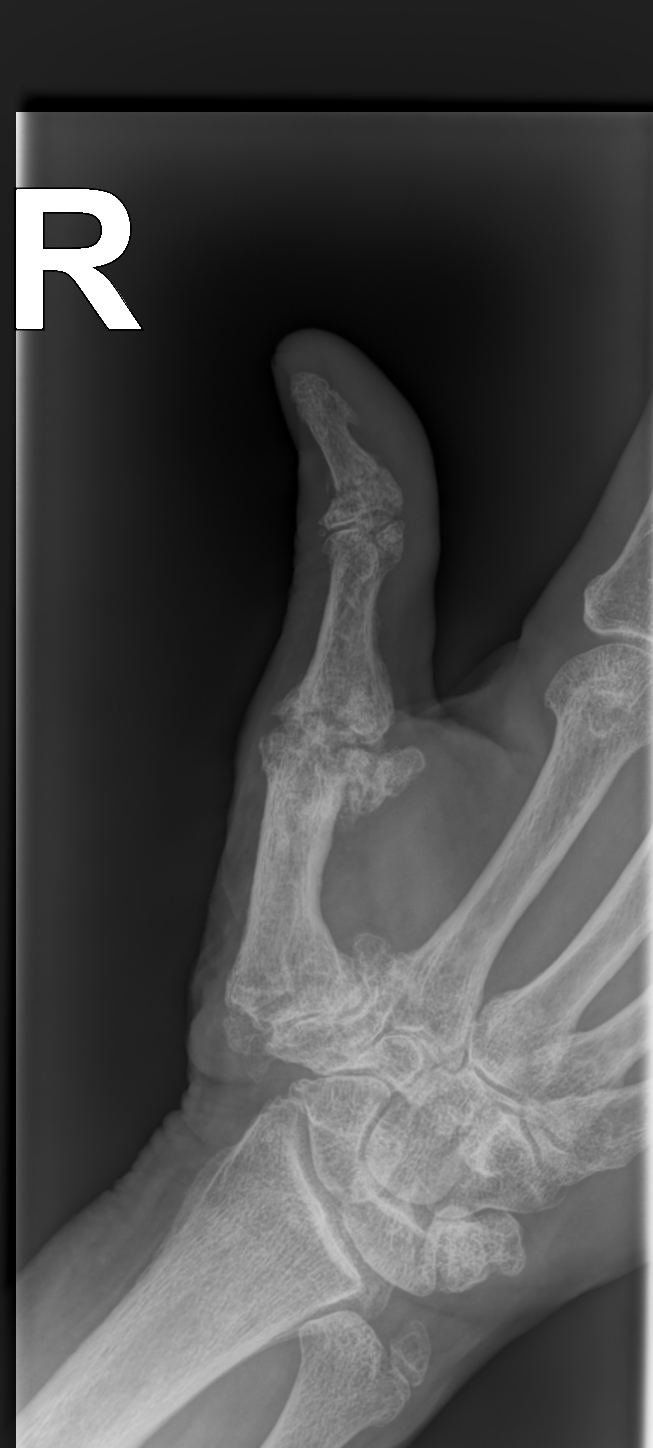

[finger oblique]
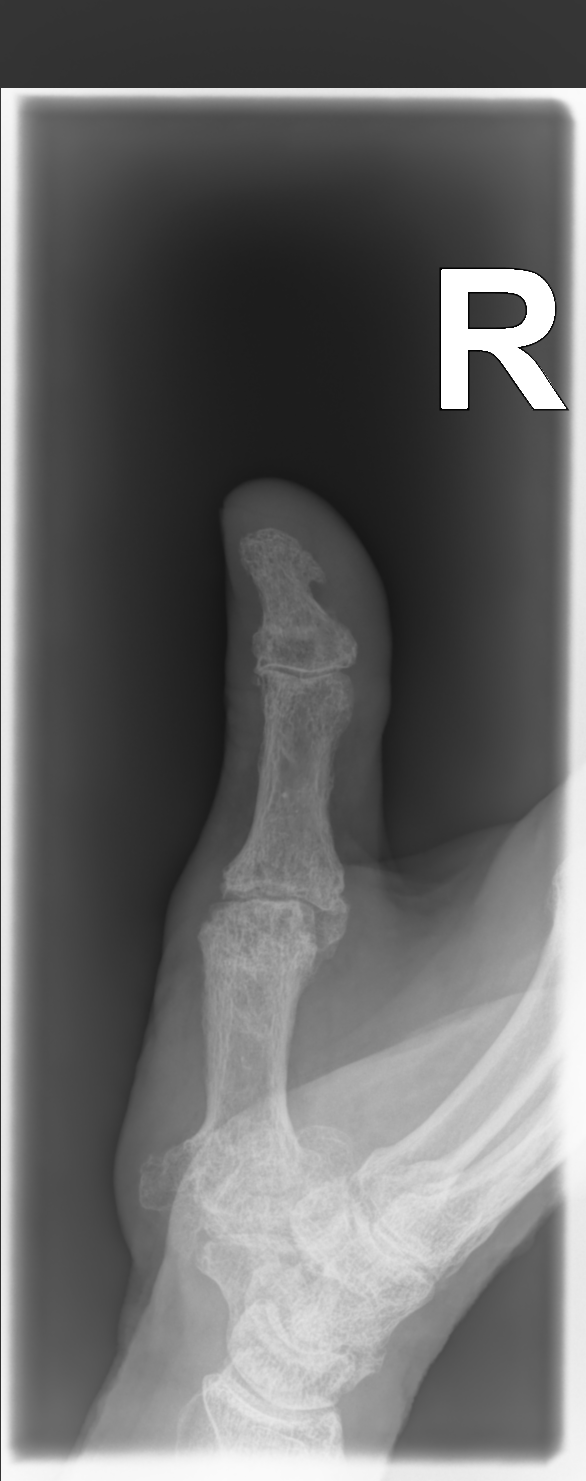

[finger lat]
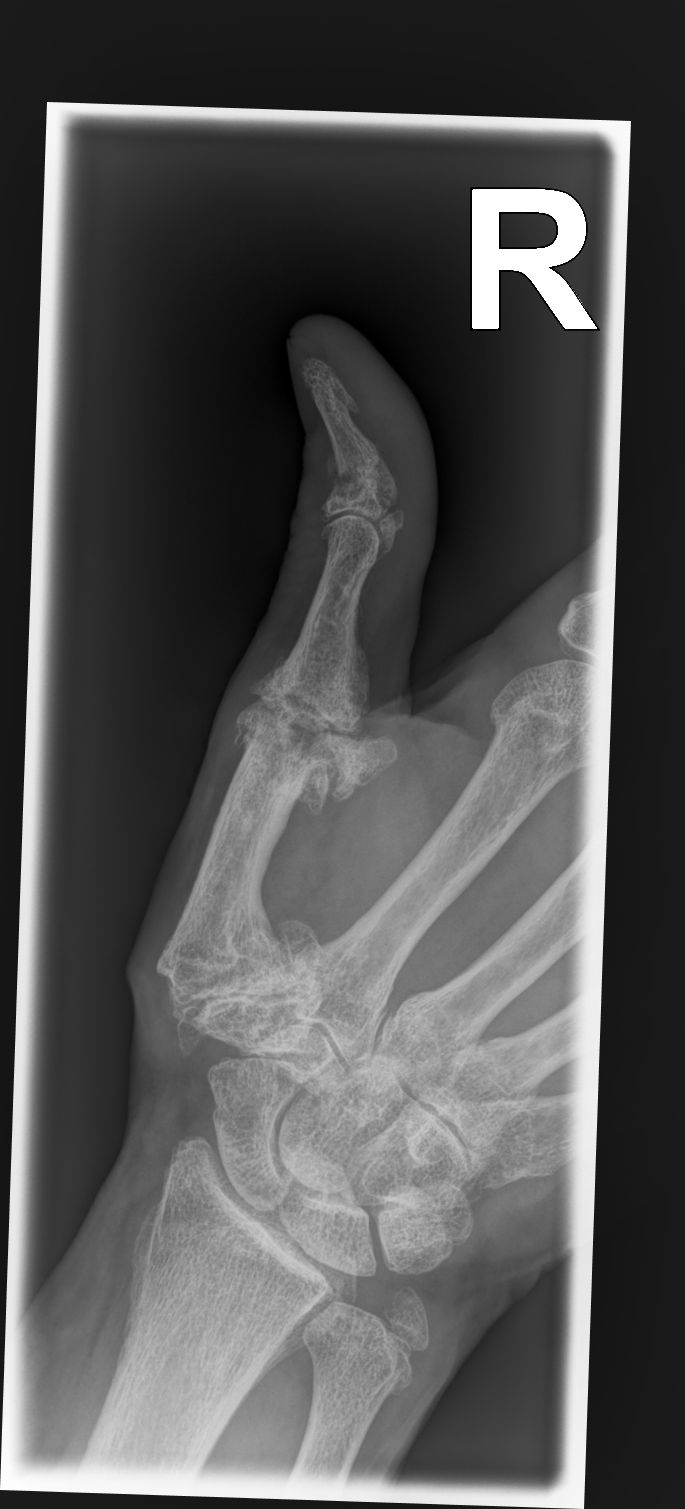

[3 of 3 positions shown; findings below may reference images not displayed]

FINDINGS: There is advanced chronic arthropathy at the MCP joint of the thumb
with erosions and fragmentation. I do not see any change or
traumatic finding relative to that. There is chronic osteoarthritis
at the first carpometacarpal joint. There is fracture at the base of
the distal phalanx of the thumb, not displaced.
IMPRESSION: Nondisplaced fracture of the proximal aspect of the distal phalanx
of the thumb.

Chronic erosive arthropathy of the MCP joint of the thumb,
presumably erosive osteoarthritis.

Chronic osteoarthritis of the first carpometacarpal joint.

## 2019-07-02 NOTE — Telephone Encounter (Signed)
Pt's daughter called back.  Per FC send message.

## 2019-07-02 NOTE — Telephone Encounter (Signed)
Isla Pence spoke with patient's daughter previously.

## 2019-07-28 ENCOUNTER — Other Ambulatory Visit (HOSPITAL_COMMUNITY): Payer: Self-pay | Admitting: Psychiatry

## 2019-07-28 ENCOUNTER — Other Ambulatory Visit: Payer: Self-pay | Admitting: Family Medicine

## 2019-07-28 DIAGNOSIS — F200 Paranoid schizophrenia: Secondary | ICD-10-CM

## 2019-07-30 ENCOUNTER — Telehealth: Payer: Self-pay | Admitting: Family Medicine

## 2019-07-30 NOTE — Telephone Encounter (Signed)
I called the patient to schedule AWV with Loma Sousa.  When explaining the reason for my call, my phone went out and we were disconnected.  I called back about 5 minutes later, but there was no answer and no option to leave a message.  If patient calls back, please schedule Medicare Wellness Visit at next available opening.  VDM (Dee-Dee)

## 2019-08-05 ENCOUNTER — Telehealth: Payer: Self-pay | Admitting: Family Medicine

## 2019-08-05 NOTE — Telephone Encounter (Signed)
Copied from [place] #[id number]. Topic: General - Other >> Aug 05, 2019  9:49 AM Antonieta Iba C wrote: Reason for CRM: pt's daughter called in to speak with provider CMA, she would like to update/discuss pt's new pharmacy and Rx. >> Aug 05, 2019  4:07 PM Keene Breath wrote: Daughter is calling to speak with the nurse to make sure that all future prescriptions for her mother are  Sent to a different pharmacy.  Please call to discuss.  CB# [phone]

## 2019-08-05 NOTE — Telephone Encounter (Signed)
See note  Copied from [place] #[id number]. Topic: General - Other >> Aug 05, 2019  9:49 AM Antonieta Iba C wrote: Reason for CRM: pt's daughter called in to speak with provider CMA, she would like to update/discuss pt's new pharmacy and Rx. >> Aug 05, 2019  4:07 PM Keene Breath wrote: Daughter is calling to speak with the nurse to make sure that all future prescriptions for her mother are  Sent to a different pharmacy.  Please call to discuss.  CB# [phone]

## 2019-08-05 NOTE — Telephone Encounter (Signed)
Copied from [place] #[id number]. Topic: General - Other >> Aug 05, 2019  9:49 AM Antonieta Iba C wrote: Reason for CRM: pt's daughter called in to speak with provider CMA, she would like to update/discuss pt's new pharmacy and Rx.

## 2019-08-07 ENCOUNTER — Other Ambulatory Visit: Payer: Self-pay

## 2019-08-07 ENCOUNTER — Ambulatory Visit: Payer: Self-pay

## 2019-08-07 DIAGNOSIS — F200 Paranoid schizophrenia: Secondary | ICD-10-CM

## 2019-08-07 DIAGNOSIS — F411 Generalized anxiety disorder: Secondary | ICD-10-CM

## 2019-08-07 MED ORDER — HYDROXYZINE PAMOATE 25 MG PO CAPS: 25.0000 mg | ORAL_CAPSULE | Freq: Every day | ORAL | 3 refills | Status: DC

## 2019-08-07 MED ORDER — ATORVASTATIN CALCIUM 40 MG PO TABS: 40.0000 mg | ORAL_TABLET | Freq: Every day | ORAL | 3 refills | Status: DC

## 2019-08-07 MED ORDER — FLUTICASONE PROPIONATE 50 MCG/ACT NA SUSP: NASAL | 5 refills | Status: DC

## 2019-08-07 MED ORDER — DICLOFENAC SODIUM 1 % TD GEL: TRANSDERMAL | 4 refills | Status: DC

## 2019-08-07 MED ORDER — ALLOPURINOL 300 MG PO TABS: 300.0000 mg | ORAL_TABLET | Freq: Every day | ORAL | 3 refills | Status: DC

## 2019-08-07 MED ORDER — HYDROCHLOROTHIAZIDE 25 MG PO TABS: 25.0000 mg | ORAL_TABLET | Freq: Every day | ORAL | 3 refills | Status: DC

## 2019-08-07 MED ORDER — PANTOPRAZOLE SODIUM 40 MG PO TBEC: DELAYED_RELEASE_TABLET | ORAL | 2 refills | Status: DC

## 2019-08-07 MED ORDER — LOSARTAN POTASSIUM 100 MG PO TABS: 100.0000 mg | ORAL_TABLET | Freq: Every day | ORAL | 3 refills | Status: DC

## 2019-08-07 MED ORDER — POTASSIUM CHLORIDE ER 10 MEQ PO CPCR: 10.0000 meq | ORAL_CAPSULE | Freq: Every day | ORAL | 3 refills | Status: DC

## 2019-08-07 MED ORDER — BUPROPION HCL ER (XL) 300 MG PO TB24: 300.0000 mg | ORAL_TABLET | Freq: Every morning | ORAL | 2 refills | Status: DC

## 2019-08-07 NOTE — Telephone Encounter (Signed)
Spoke with patient,voices understanding.

## 2019-08-12 NOTE — Telephone Encounter (Signed)
See request °

## 2019-08-13 ENCOUNTER — Telehealth: Payer: Self-pay | Admitting: Physical Therapy

## 2019-08-13 ENCOUNTER — Telehealth: Payer: Self-pay | Admitting: Family Medicine

## 2019-08-13 ENCOUNTER — Other Ambulatory Visit: Payer: Self-pay

## 2019-08-13 DIAGNOSIS — M545 Low back pain, unspecified: Secondary | ICD-10-CM

## 2019-08-13 DIAGNOSIS — N649 Disorder of breast, unspecified: Secondary | ICD-10-CM

## 2019-08-13 NOTE — Telephone Encounter (Signed)
Pt daughter would like to be aware when this is done please.

## 2019-08-13 NOTE — Telephone Encounter (Signed)
Copied from [place] [id number]. Topic: General - Other >> Aug 13, 2019 12:57 PM Rayann Heman wrote: Reason for CRM: pt daughter called and stated that pt will need a diagnostic mammogram because of boil on breast. Would like the order sent to [facility].

## 2019-08-13 NOTE — Telephone Encounter (Signed)
Pt daughter called and stated that she would like medication traMADol (ULTRAM) 50 MG tablet [id number]   transferred over to  [place], [place] [phone] (Phone) [phone] (Fax)   Please advise

## 2019-08-13 NOTE — Telephone Encounter (Signed)
Rx request. Changed Pharmacy

## 2019-08-13 NOTE — Telephone Encounter (Signed)
Order has been placed.

## 2019-08-14 MED ORDER — TRAMADOL HCL 50 MG PO TABS: ORAL_TABLET | ORAL | 3 refills | Status: DC

## 2019-08-14 NOTE — Telephone Encounter (Signed)
[facility] sent

## 2019-08-14 NOTE — Telephone Encounter (Signed)
Rx faxed

## 2019-08-14 NOTE — Telephone Encounter (Signed)
Rx printed - will need to fx to pharmacy.  Algis Greenhouse. [name], MD 08/14/2019 11:45 AM

## 2019-08-19 ENCOUNTER — Other Ambulatory Visit: Payer: Self-pay | Admitting: Family Medicine

## 2019-08-19 DIAGNOSIS — Z1231 Encounter for screening mammogram for malignant neoplasm of breast: Secondary | ICD-10-CM

## 2019-08-28 ENCOUNTER — Encounter (HOSPITAL_COMMUNITY): Payer: Self-pay | Admitting: Psychiatry

## 2019-08-28 ENCOUNTER — Other Ambulatory Visit: Payer: Self-pay

## 2019-08-28 ENCOUNTER — Ambulatory Visit (INDEPENDENT_AMBULATORY_CARE_PROVIDER_SITE_OTHER): Payer: Medicare Other | Admitting: Psychiatry

## 2019-08-28 DIAGNOSIS — F411 Generalized anxiety disorder: Secondary | ICD-10-CM | POA: Diagnosis not present

## 2019-08-28 DIAGNOSIS — F200 Paranoid schizophrenia: Secondary | ICD-10-CM | POA: Diagnosis not present

## 2019-08-28 MED ORDER — HALOPERIDOL 0.5 MG PO TABS: ORAL_TABLET | ORAL | 0 refills | Status: DC
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
## 2019-09-01 ENCOUNTER — Ambulatory Visit (INDEPENDENT_AMBULATORY_CARE_PROVIDER_SITE_OTHER): Payer: Medicare Other

## 2019-09-01 ENCOUNTER — Other Ambulatory Visit: Payer: Self-pay

## 2019-09-01 VITALS — BP 134/68 | Temp 98.1°F | Ht 62.0 in | Wt 243.0 lb

## 2019-09-01 DIAGNOSIS — Z0000 Encounter for general adult medical examination without abnormal findings: Secondary | ICD-10-CM

## 2019-09-01 DIAGNOSIS — R300 Dysuria: Secondary | ICD-10-CM | POA: Diagnosis not present

## 2019-09-01 DIAGNOSIS — N3090 Cystitis, unspecified without hematuria: Secondary | ICD-10-CM | POA: Diagnosis not present

## 2019-09-01 LAB — POCT URINALYSIS DIPSTICK
Bilirubin, UA: NEGATIVE
Blood, UA: NEGATIVE
Glucose, UA: NEGATIVE
Ketones, UA: NEGATIVE
Leukocytes, UA: NEGATIVE
Nitrite, UA: NEGATIVE
Protein, UA: NEGATIVE
Spec Grav, UA: 1.025 (ref 1.010–1.025)
Urobilinogen, UA: 0.2 E.U./dL
pH, UA: 6 (ref 5.0–8.0)

## 2019-09-01 NOTE — Patient Instructions (Signed)
[name] , Thank you for taking time to come for your Medicare Wellness Visit. I appreciate your ongoing commitment to your health goals. Please review the following plan we discussed and let me know if I can assist you in the future.   Screening recommendations/referrals: Colorectal Screening: No longer indicated Mammogram: scheduled for December Bone Density: No longer indicated   Vision and Dental Exams: Recommended annual ophthalmology exams for early detection of glaucoma and other disorders of the eye Recommended annual dental exams for proper oral hygiene  Vaccinations: Influenza vaccine: completed 06/27/19 Pneumococcal vaccine: up to date; last 11/02/16 Tdap vaccine: recommended; Please call your insurance company to determine your out of pocket expense. You may receive this vaccine at your local pharmacy or Health Dept. Shingles vaccine: Please call your insurance company to determine your out of pocket expense for the Shingrix vaccine. You may receive this vaccine at your local pharmacy.  Advanced directives: We have received a copy of your POA (Power of [place]) and/or Living Will. These documents can be located in your chart.  Goals: Recommend to drink at least 6-8 8oz glasses of water per day and consume a balanced diet rich in fresh fruits and vegetables.   Next appointment: Please schedule your Annual Wellness Visit with your Nurse Health Advisor in one year.  We will check your urine today for infection and call you with the results.   Preventive Care 43 Years and Older, Female Preventive care refers to lifestyle choices and visits with your health care provider that can promote health and wellness. What does preventive care include?  A yearly physical exam. This is also called an annual well check.  Dental exams once or twice a year.  Routine eye exams. Ask your health care provider how often you should have your eyes checked.  Personal lifestyle choices,  including:  Daily care of your teeth and gums.  Regular physical activity.  Eating a healthy diet.  Avoiding tobacco and drug use.  Limiting alcohol use.  Practicing safe sex.  Taking low-dose aspirin every day if recommended by your health care provider.  Taking vitamin and mineral supplements as recommended by your health care provider. What happens during an annual well check? The services and screenings done by your health care provider during your annual well check will depend on your age, overall health, lifestyle risk factors, and family history of disease. Counseling  Your health care provider may ask you questions about your:  Alcohol use.  Tobacco use.  Drug use.  Emotional well-being.  Home and relationship well-being.  Sexual activity.  Eating habits.  History of falls.  Memory and ability to understand (cognition).  Work and work Statistician.  Reproductive health. Screening  You may have the following tests or measurements:  Height, weight, and BMI.  Blood pressure.  Lipid and cholesterol levels. These may be checked every 5 years, or more frequently if you are over 56 years old.  Skin check.  Lung cancer screening. You may have this screening every year starting at age 35 if you have a 30-pack-year history of smoking and currently smoke or have quit within the past 15 years.  Fecal occult blood test (FOBT) of the stool. You may have this test every year starting at age 6.  Flexible sigmoidoscopy or colonoscopy. You may have a sigmoidoscopy every 5 years or a colonoscopy every 10 years starting at age 69.  Hepatitis C blood test.  Hepatitis B blood test.  Sexually transmitted disease (STD)  testing.  Diabetes screening. This is done by checking your blood sugar (glucose) after you have not eaten for a while (fasting). You may have this done every 1-3 years.  Bone density scan. This is done to screen for osteoporosis. You may have this  done starting at age 71.  Mammogram. This may be done every 1-2 years. Talk to your health care provider about how often you should have regular mammograms. Talk with your health care provider about your test results, treatment options, and if necessary, the need for more tests. Vaccines  Your health care provider may recommend certain vaccines, such as:  Influenza vaccine. This is recommended every year.  Tetanus, diphtheria, and acellular pertussis (Tdap, Td) vaccine. You may need a Td booster every 10 years.  Zoster vaccine. You may need this after age 42.  Pneumococcal 13-valent conjugate (PCV13) vaccine. One dose is recommended after age 36.  Pneumococcal polysaccharide (PPSV23) vaccine. One dose is recommended after age 2. Talk to your health care provider about which screenings and vaccines you need and how often you need them. This information is not intended to replace advice given to you by your health care provider. Make sure you discuss any questions you have with your health care provider. Document Released: 11/12/2015 Document Revised: 07/05/2016 Document Reviewed: 08/17/2015 Elsevier Interactive Patient Education  2017 [place] Prevention in the Home Falls can cause injuries. They can happen to people of all ages. There are many things you can do to make your home safe and to help prevent falls. What can I do on the outside of my home?  Regularly fix the edges of walkways and driveways and fix any cracks.  Remove anything that might make you trip as you walk through a door, such as a raised step or threshold.  Trim any bushes or trees on the path to your home.  Use bright outdoor lighting.  Clear any walking paths of anything that might make someone trip, such as rocks or tools.  Regularly check to see if handrails are loose or broken. Make sure that both sides of any steps have handrails.  Any raised decks and porches should have guardrails on the  edges.  Have any leaves, snow, or ice cleared regularly.  Use sand or salt on walking paths during winter.  Clean up any spills in your garage right away. This includes oil or grease spills. What can I do in the bathroom?  Use night lights.  Install grab bars by the toilet and in the tub and shower. Do not use towel bars as grab bars.  Use non-skid mats or decals in the tub or shower.  If you need to sit down in the shower, use a plastic, non-slip stool.  Keep the floor dry. Clean up any water that spills on the floor as soon as it happens.  Remove soap buildup in the tub or shower regularly.  Attach bath mats securely with double-sided non-slip rug tape.  Do not have throw rugs and other things on the floor that can make you trip. What can I do in the bedroom?  Use night lights.  Make sure that you have a light by your bed that is easy to reach.  Do not use any sheets or blankets that are too big for your bed. They should not hang down onto the floor.  Have a firm chair that has side arms. You can use this for support while you get dressed.  Do not  have throw rugs and other things on the floor that can make you trip. What can I do in the kitchen?  Clean up any spills right away.  Avoid walking on wet floors.  Keep items that you use a lot in easy-to-reach places.  If you need to reach something above you, use a strong step stool that has a grab bar.  Keep electrical cords out of the way.  Do not use floor polish or wax that makes floors slippery. If you must use wax, use non-skid floor wax.  Do not have throw rugs and other things on the floor that can make you trip. What can I do with my stairs?  Do not leave any items on the stairs.  Make sure that there are handrails on both sides of the stairs and use them. Fix handrails that are broken or loose. Make sure that handrails are as long as the stairways.  Check any carpeting to make sure that it is firmly  attached to the stairs. Fix any carpet that is loose or worn.  Avoid having throw rugs at the top or bottom of the stairs. If you do have throw rugs, attach them to the floor with carpet tape.  Make sure that you have a light switch at the top of the stairs and the bottom of the stairs. If you do not have them, ask someone to add them for you. What else can I do to help prevent falls?  Wear shoes that:  Do not have high heels.  Have rubber bottoms.  Are comfortable and fit you well.  Are closed at the toe. Do not wear sandals.  If you use a stepladder:  Make sure that it is fully opened. Do not climb a closed stepladder.  Make sure that both sides of the stepladder are locked into place.  Ask someone to hold it for you, if possible.  Clearly mark and make sure that you can see:  Any grab bars or handrails.  First and last steps.  Where the edge of each step is.  Use tools that help you move around (mobility aids) if they are needed. These include:  Canes.  Walkers.  Scooters.  Crutches.  Turn on the lights when you go into a dark area. Replace any light bulbs as soon as they burn out.  Set up your furniture so you have a clear path. Avoid moving your furniture around.  If any of your floors are uneven, fix them.  If there are any pets around you, be aware of where they are.  Review your medicines with your doctor. Some medicines can make you feel dizzy. This can increase your chance of falling. Ask your doctor what other things that you can do to help prevent falls. This information is not intended to replace advice given to you by your health care provider. Make sure you discuss any questions you have with your health care provider. Document Released: 08/12/2009 Document Revised: 03/23/2016 Document Reviewed: 11/20/2014 Elsevier Interactive Patient Education  2017 [employer].

## 2019-09-01 NOTE — Progress Notes (Signed)
Subjective:   [name] is a 78 y.o. female who presents for Medicare Annual (Subsequent) preventive examination.  Review of Systems:   Cardiac Risk Factors include: advanced age (>62men, >45 women);hypertension;sedentary lifestyle;dyslipidemia    Objective:     Vitals: BP 134/68   Temp 98.1 F (36.7 C) (Temporal)   Ht 5\' 2"  (1.575 m)   Wt 243 lb (110.2 kg)   BMI 44.45 kg/m   Body mass index is 44.45 kg/m.  Advanced Directives 09/01/2019 12/05/2018 12/03/2018 08/01/2018 08/30/2017 08/07/2017 08/06/2017  Does Patient Have a Medical Advance Directive? Yes No No Yes No No No  Type of Advance Directive [place]  Does patient want to make changes to medical advance directive? No - Patient declined - - - - - -  Copy of [place] in Chart? Yes - validated most recent copy scanned in chart (See row information) - - - - - -  Would patient like information on creating a medical advance directive? - No - Patient declined No - Patient declined - - No - Patient declined Yes (MAU/Ambulatory/Procedural Areas - Information given)    Tobacco Social History   Tobacco Use  Smoking Status Former Smoker  . Packs/day: 0.10  . Years: 35.00  . Pack years: 3.50  . Types: Cigarettes  . Quit date: 04/08/2013  . Years since quitting: 6.4  Smokeless Tobacco Never Used  Tobacco Comment   smoked a few when her son died in Feb 08, 2013      Counseling given: Not Answered Comment: smoked a few when her son died in 2013/02/08    Clinical Intake:  Pre-visit preparation completed: Yes  Pain : No/denies pain  Diabetes: No  How often do you need to have someone help you when you read instructions, pamphlets, or other written materials from your doctor or pharmacy?: 3 - Sometimes  Interpreter Needed?: No  Comments: accompanied by daughter Information entered by :: Denman George LPN  Past Medical History:  Diagnosis Date  . Chronic venous insufficiency    . Colon, diverticulosis Sept. 2011   outpatient colonoscopy by Dr. Cristina Gong.  Need record  . DDD (degenerative disc disease), lumbosacral   . Depression   . DJD (degenerative joint disease), multiple sites    Low back pain worst  . GERD (gastroesophageal reflux disease)   . Gout   . Gout    Uric Acid level 4.2 on 300 allopurinol  . History of cervical cancer 02-09-1971  . Hx-TIA (transient ischemic attack) 05/13/2001   Right facial numbness  . Hx-TIA (transient ischemic attack) 05/13/2001   Looking back in Epic, the patient had a hospitalization in July 2002 for evaluation of questionable TIA (s/s of right facial numbness associated with mild blurred vision in her right eye, diaphoresis, and mild confusion) with negative CT head and negative MRI/MRA and was started on Plavix after being heparinized. She was to have possible follow up with Neurology for reevaluation of the need for Pl  . Hyperlipidemia   . Hypertension   . Keloid 08/01/2018  . Memory disorder 12/20/2016  . Mitral valve prolapse 10/27/1999   Subsequent 2D echo show normal mitral valve  . Osteoarthritis of hip    bilateral hips  . Ovarian cancer ([place]) Feb 09, 1971   S/P oophorectomy  . Psychosis ([place])   . Recurrent boils    History of MRSA skin infections with abscess  . Sensorineural hearing loss of both ears   .  Subdural hematoma ([place]) 01/26/2017   bilateral  . Uncomplicated opioid dependence ([place]) 01/31/2018   Past Surgical History:  Procedure Laterality Date  . ABDOMINAL HYSTERECTOMY     1972  . BURR HOLE Bilateral 02/12/2017   Procedure: Haskell Flirt;  Surgeon: Earnie Larsson, MD;  Location: [place] [facility];  Service: Neurosurgery;  Laterality: Bilateral;  . CERVICAL DISCECTOMY  7/07   C5-C6  . JOINT REPLACEMENT     bilateral hip replacement  . LUMBAR DISC SURGERY     L5-S1 7/07  . LUMBAR LAMINECTOMY/DECOMPRESSION MICRODISCECTOMY N/A 02/02/2016   Procedure: L4-5 Decompression;  Surgeon: Marybelle Killings, MD;  Location: [facility];  Service:  Orthopedics;  Laterality: N/A;  . [place] for ovarian cancer   Family History  Problem Relation Age of Onset  . Diabetes Mother   . Heart disease Mother   . Hearing loss Mother   . Stroke Mother   . Post-traumatic stress disorder Father   . Schizophrenia Maternal Grandmother   . Diabetes Maternal Grandfather   . Other Brother        brother died in mental health hospital  . Kidney cancer Neg Hx   . Bladder Cancer Neg Hx    Social History   Socioeconomic History  . Marital status: Widowed    Spouse name: Not on file  . Number of children: 3  . Years of education: 85  . Highest education level: Not on file  Occupational History  . Occupation: Retired  Scientific laboratory technician  . Financial resource strain: Not on file  . Food insecurity    Worry: Not on file    Inability: Not on file  . Transportation needs    Medical: Not on file    Non-medical: Not on file  Tobacco Use  . Smoking status: Former Smoker    Packs/day: 0.10    Years: 35.00    Pack years: 3.50    Types: Cigarettes    Quit date: 04/08/2013    Years since quitting: 6.4  . Smokeless tobacco: Never Used  . Tobacco comment: smoked a few when her son died in 2013-02-04   Substance and Sexual Activity  . Alcohol use: No    Alcohol/week: 0.0 standard drinks  . Drug use: No  . Sexual activity: Never  Lifestyle  . Physical activity    Days per week: Not on file    Minutes per session: Not on file  . Stress: Not on file  Relationships  . Social Herbalist on phone: Not on file    Gets together: Not on file    Attends religious service: Not on file    Active member of club or organization: Not on file    Attends meetings of clubs or organizations: Not on file    Relationship status: Not on file  Other Topics Concern  . Not on file  Social History Narrative   Lives with daughter in [place]. Daughter is primary caretaker, surrogate decision-maker, and arranges for [facility] aide and other caregivers  to supervise Ms. Quintanilla at home. Ambulatory    Outpatient Encounter Medications as of 09/01/2019  Medication Sig  . acetaminophen (TYLENOL) 500 MG tablet Take 500 mg by mouth every 8 (eight) hours as needed.  [name] allopurinol (ZYLOPRIM) 300 MG tablet Take 1 tablet (300 mg total) by mouth daily.  [name] ascorbic acid (VITAMIN C) 500 MG tablet Take 500 mg by mouth daily.  [name] atorvastatin (LIPITOR) 40 MG  tablet Take 1 tablet (40 mg total) by mouth daily.  [name] buPROPion (WELLBUTRIN XL) 300 MG 24 hr tablet Take 1 tablet (300 mg total) by mouth every morning.  . diclofenac sodium (VOLTAREN) 1 % GEL APPLY 4 GRAMS TOPICALLY FOUR TIMES A DAY  . fluticasone (FLONASE) 50 MCG/ACT nasal spray INSTILL 2 SPRAYS IN EACH NOSTRIL DAILY  . haloperidol (HALDOL) 0.5 MG tablet Take 1 tablet at bedtime and second tablet to the day for agitation.  . hydrochlorothiazide (HYDRODIURIL) 25 MG tablet Take 1 tablet (25 mg total) by mouth daily.  . hydrOXYzine (VISTARIL) 25 MG capsule Take 1 capsule (25 mg total) by mouth at bedtime.  . lactobacillus (FLORANEX/LACTINEX) PACK Take 1 packet (1 g total) by mouth 3 (three) times daily with meals.  [name] losartan (COZAAR) 100 MG tablet Take 1 tablet (100 mg total) by mouth daily.  . Multiple Vitamins-Minerals (ALIVE WOMENS ENERGY) TABS Take 1 tablet by mouth daily.   . pantoprazole (PROTONIX) 40 MG tablet TAKE 1 TABLET BY MOUTH TWICE DAILY BEFORE A MEAL  . potassium chloride (MICRO-K) 10 MEQ CR capsule Take 1 capsule (10 mEq total) by mouth daily.  . pramoxine-hydrocortisone (PROCTOCREAM-HC) 1-1 % rectal cream Place 1 application rectally 2 (two) times daily.  . traMADol (ULTRAM) 50 MG tablet TAKE 1 TABLET BY MOUTH 2 TIMES A DAY AS NEEDED  . doxycycline (VIBRA-TABS) 100 MG tablet Take 1 tablet (100 mg total) by mouth 2 (two) times daily.   No facility-administered encounter medications on file as of 09/01/2019.     Activities of Daily Living In your present state of health, do you have  any difficulty performing the following activities: 09/01/2019  Hearing? Y  Vision? N  Difficulty concentrating or making decisions? Y  Walking or climbing stairs? Y  Dressing or bathing? N  Doing errands, shopping? Y  Preparing Food and eating ? N  Using the Toilet? N  In the past six months, have you accidently leaked urine? N  Do you have problems with loss of bowel control? N  Managing your Medications? Y  Comment assisted by family  Managing your Finances? Y  Comment handled by daughter  Housekeeping or managing your Housekeeping? Y  Comment assisted by family  Some recent data might be hidden    Patient Care Team: Vivi Barrack, MD as PCP - General (Family Medicine) Adele Schilder, Arlyce Harman, MD as Consulting Physician (Psychiatry) Darleen Crocker, MD as Consulting Physician (Ophthalmology) Marybelle Killings, MD as Consulting Physician (Orthopedic Surgery)    Assessment:   This is a routine wellness examination for Naydeline.  Exercise Activities and Dietary recommendations Current Exercise Habits: The patient does not participate in regular exercise at present  Goals    . Weight < 205 lb (92.987 kg)       Fall Risk Fall Risk  09/01/2019 06/27/2019 12/23/2018 08/01/2018 04/29/2018  Falls in the past year? 1 0 - Yes Yes  Comment - - - - -  Number falls in past yr: 0 - 0 1 1  Comment - - - lights went out; not witnessed -  Injury with Fall? 1 - 1 - Yes  Risk Factor Category  - - - - High Fall Risk  Risk for fall due to : History of fall(s);Impaired mobility;Impaired balance/gait - - - -  Risk for fall due to: Comment - - - - -  Follow up Falls evaluation completed;Education provided;Falls prevention discussed - - - Education provided   Is the patient's  home free of loose throw rugs in walkways, pet beds, electrical cords, etc?   yes      Grab bars in the bathroom? yes      Handrails on the stairs?   yes      Adequate lighting?   yes  Timed Get Up and Go performed: completed and  within normal timeframe; no gait abnormalities noted, however patient does utilize the use of a walker and needs standby assist with transfers   Depression Screen PHQ 2/9 Scores 09/01/2019 08/01/2018 08/01/2018 08/07/2017  PHQ - 2 Score 2 3 0 0  PHQ- 9 Score 4 5 - -     Cognitive Function MMSE - Mini Mental State Exam 08/01/2018 04/29/2018 06/20/2017 01/26/2017 12/20/2016  Orientation to time 5 5 3 5 4   Orientation to Place 5 5 4 5 5   Registration 3 3 2 3 3   Attention/ Calculation 5 5 0 5 5  Recall 1 2 1 3 1   Language- name 2 objects 2 2 2 2 2   Language- repeat 1 1 1 1 1   Language- follow 3 step command 3 2 3 3 3   Language- read & follow direction 1 1 1 1 1   Write a sentence 1 1 1 1 1   Copy design 1 1 1  0 0  Total score 28 28 19 29 26      6CIT Screen 09/01/2019  What Year? 0 points  What month? 0 points  What time? 0 points  Count back from 20 0 points  Months in reverse 0 points  Repeat phrase 4 points  Total Score 4    Immunization History  Administered Date(s) Administered  . Fluad Quad(high Dose 65+) 06/27/2019  . Influenza Split 08/03/2011, 07/30/2012  . Influenza Whole 08/09/2007, 07/27/2008, 07/26/2009, 07/20/2010  . Influenza, High Dose Seasonal PF 07/17/2017, 08/01/2018  . Influenza,inj,Quad PF,6+ Mos 07/17/2013, 07/08/2015, 08/03/2016  . Influenza-Unspecified 07/18/2014  . Pneumococcal Conjugate-13 02/11/2015  . Pneumococcal Polysaccharide-23 08/26/2010, 11/02/2016  . Td 05/07/2009  . Zoster 10/09/2012    Qualifies for Shingles Vaccine?Discussed and patient will check with pharmacy for coverage.  Patient education handout provided   Screening Tests Health Maintenance  Topic Date Due  . MAMMOGRAM  07/06/2018  . TETANUS/TDAP  05/08/2019  . INFLUENZA VACCINE  Completed  . DEXA SCAN  Completed  . PNA vac Low Risk Adult  Completed    Cancer Screenings: Lung: Low Dose CT Chest recommended if Age 39-80 years, 30 pack-year currently smoking OR have quit w/in  15years. Patient does not qualify. Breast:  Up to date on Mammogram? Yes, scheduled for December  Up to date of Bone Density/Dexa? Yes Colorectal: No longer indicated     Plan:  I have personally reviewed and addressed the Medicare Annual Wellness questionnaire and have noted the following in the patient's chart:  A. Medical and social history B. Use of alcohol, tobacco or illicit drugs  C. Current medications and supplements D. Functional ability and status E.  Nutritional status F.  Physical activity G. Advance directives H. List of other physicians I.  Hospitalizations, surgeries, and ER visits in previous 12 months J.  [place] such as hearing and vision if needed, cognitive and depression L. Referrals, records requested, and appointments- none   In addition, I have reviewed and discussed with patient certain preventive protocols, quality metrics, and best practice recommendations. A written personalized care plan for preventive services as well as general preventive health recommendations were provided to patient.   Signed,  Denman George, LPN  Nurse Health Advisor   Nurse Notes: Patient with complaints of cystitis x 10 days.  Urinalysis and urine culture obtained.  Family aware that they will be notified with results.

## 2019-09-03 LAB — URINE CULTURE
MICRO NUMBER:: 1054547
SPECIMEN QUALITY:: ADEQUATE

## 2019-09-03 NOTE — Progress Notes (Signed)
Please inform patient of the following:  Urine culture negative - she does not have a uti. Would like for her to schedule OV if still having symptoms.  [name]. [name], MD 09/03/2019 8:21 AM

## 2019-09-09 ENCOUNTER — Other Ambulatory Visit: Payer: Self-pay | Admitting: Family Medicine

## 2019-10-06 ENCOUNTER — Ambulatory Visit
Admission: RE | Admit: 2019-10-06 | Discharge: 2019-10-06 | Disposition: A | Discharge status: Home / Self Care | Payer: Medicare Other | Source: Ambulatory Visit | Attending: Family Medicine | Admitting: Family Medicine

## 2019-10-06 ENCOUNTER — Other Ambulatory Visit: Payer: Self-pay

## 2019-10-06 DIAGNOSIS — Z1231 Encounter for screening mammogram for malignant neoplasm of breast: Secondary | ICD-10-CM

## 2019-10-14 ENCOUNTER — Ambulatory Visit (INDEPENDENT_AMBULATORY_CARE_PROVIDER_SITE_OTHER): Payer: Medicare Other | Admitting: Orthopaedic Surgery

## 2019-10-14 ENCOUNTER — Encounter: Payer: Self-pay | Admitting: Orthopaedic Surgery

## 2019-10-14 ENCOUNTER — Other Ambulatory Visit: Payer: Self-pay

## 2019-10-14 DIAGNOSIS — I878 Other specified disorders of veins: Secondary | ICD-10-CM | POA: Insufficient documentation

## 2019-10-14 NOTE — Progress Notes (Signed)
Office Visit Note   Patient: [name]           Date of Birth: 1941/05/05           MRN: [id number] Visit Date: 10/14/2019              Requested by: Vivi Barrack, MD [address] [place],  [place] [address] PCP: Vivi Barrack, MD   Assessment & Plan: Visit Diagnoses:  1. Venous stasis of lower extremity     Plan: Patient has moderate failure she is gaining weight has more edema of the feet and ankles and needs to use TED hose.  Please apply lotion to her legs and apply the TED hose and keep them on during the day.  She needs to walk more and be more active and sitting all day long.  We discussed recommendations TED hose were supplied and applied.  Use was discussed.  She will try to walk more with her walker.  We discussed as well up with a blood pressure as well as with her heart.  Follow-up as needed.  Follow-Up Instructions: No follow-ups on file.   Orders:  No orders of the defined types were placed in this encounter.  No orders of the defined types were placed in this encounter.     Procedures: No procedures performed   Clinical Data: No additional findings.   Subjective: Chief Complaint  Patient presents with  . Left Leg - Pain  . Right Leg - Pain    HPI [name] is here with her daughter with problems of increased weight gain and increased swelling lower extremities.  She has been on fluid pills but still has some swelling with some venous discoloration.  She has been walking less using a rolling walker.  She has some tingling when they swell.  Chronic diastolic heart failure chronic kidney disease hypertension.  Review of Systems Reviewed updated unchanged from last office visit.  She has had bilateral hip placements.  Objective: Vital Signs: Ht 5' 3.75" (1.619 m)   Wt 245 lb (111.1 kg)   BMI 42.38 kg/m   Physical Exam Constitutional:      Appearance: She is well-developed.  HENT:     Head: Normocephalic.     Right Ear: External  ear normal.     Left Ear: External ear normal.  Eyes:     Pupils: Pupils are equal, round, and reactive to light.  Neck:     Thyroid: No thyromegaly.     Trachea: No tracheal deviation.  Cardiovascular:     Rate and Rhythm: Normal rate.  Pulmonary:     Effort: Pulmonary effort is normal.  Abdominal:     Palpations: Abdomen is soft.  Skin:    General: Skin is warm and dry.  Neurological:     Mental Status: She is alert and oriented to person, place, and time.  Psychiatric:        Behavior: Behavior normal.     Ortho Exam patient had bilateral mild pitting edema right and left with darkening pretibial region consistent with venous stasis disease.  Good capillary refill.  Edema stops at the tibial tubercle level right and left.  Specialty Comments:  No specialty comments available.  Imaging: No results found.   PMFS History: Patient Active Problem List   Diagnosis Date Noted  . Venous stasis of lower extremity 10/14/2019  . Hyperglycemia 05/06/2018  . Constipation 04/30/2018  . Osteoarthritis 01/31/2018  . Lower extremity edema 07/31/2017  .  History of bilateral hip replacements   . Memory disorder 12/20/2016  . History of lumbar laminectomy for spinal cord decompression 02/02/2016  . Atopic dermatitis 07/09/2015  . Reactive airway disease 08/11/2014  . History of cancer 08/11/2014  . OSA (obstructive sleep apnea) 08/11/2014  . Chronic kidney disease (CKD), stage III (moderate) 10/04/2012  . Schizophrenia, chronic condition ([place]) 01/20/2012  . Postmenopausal atrophic vaginitis 11/01/2010  . Chronic diastolic heart failure ([place]) 05/06/2010  . Allergic rhinitis 10/14/2009  . Obesity, Class II, BMI 35-39.9, with comorbidity 05/14/2009  . Chronic prescription opiate use 04/29/2008  . Normocytic anemia 09/25/2007  . Hyperlipidemia 09/03/2006  . Gout 09/03/2006  . Essential hypertension 09/03/2006  . GERD 09/03/2006   Past Medical History:  Diagnosis Date  .  Chronic venous insufficiency   . Colon, diverticulosis Sept. 2011   outpatient colonoscopy by Dr. Cristina Gong.  Need record  . DDD (degenerative disc disease), lumbosacral   . Depression   . DJD (degenerative joint disease), multiple sites    Low back pain worst  . GERD (gastroesophageal reflux disease)   . Gout   . Gout    Uric Acid level 4.2 on 300 allopurinol  . History of cervical cancer 1972  . Hx-TIA (transient ischemic attack) 05/13/2001   Right facial numbness  . Hx-TIA (transient ischemic attack) 05/13/2001   Looking back in Epic, the patient had a hospitalization in July 2002 for evaluation of questionable TIA (s/s of right facial numbness associated with mild blurred vision in her right eye, diaphoresis, and mild confusion) with negative CT head and negative MRI/MRA and was started on Plavix after being heparinized. She was to have possible follow up with Neurology for reevaluation of the need for Pl  . Hyperlipidemia   . Hypertension   . Keloid 08/01/2018  . Memory disorder 12/20/2016  . Mitral valve prolapse 10/27/1999   Subsequent 2D echo show normal mitral valve  . Osteoarthritis of hip    bilateral hips  . Ovarian cancer ([place]) 1972   S/P oophorectomy  . Psychosis ([place])   . Recurrent boils    History of MRSA skin infections with abscess  . Sensorineural hearing loss of both ears   . Subdural hematoma ([place]) 01/26/2017   bilateral  . Uncomplicated opioid dependence ([place]) 01/31/2018    Family History  Problem Relation Age of Onset  . Diabetes Mother   . Heart disease Mother   . Hearing loss Mother   . Stroke Mother   . Post-traumatic stress disorder Father   . Schizophrenia Maternal Grandmother   . Diabetes Maternal Grandfather   . Other Brother        brother died in mental health hospital  . Kidney cancer Neg Hx   . Bladder Cancer Neg Hx     Past Surgical History:  Procedure Laterality Date  . ABDOMINAL HYSTERECTOMY     1972  . BURR HOLE Bilateral 02/12/2017    Procedure: Haskell Flirt;  Surgeon: Earnie Larsson, MD;  Location: [facility];  Service: Neurosurgery;  Laterality: Bilateral;  . CERVICAL DISCECTOMY  7/07   C5-C6  . JOINT REPLACEMENT     bilateral hip replacement  . LUMBAR DISC SURGERY     L5-S1 7/07  . LUMBAR LAMINECTOMY/DECOMPRESSION MICRODISCECTOMY N/A 02/02/2016   Procedure: L4-5 Decompression;  Surgeon: Marybelle Killings, MD;  Location: [facility];  Service: Orthopedics;  Laterality: N/A;  . [place] for ovarian cancer   Social History   Occupational History  .  Occupation: Retired  Tobacco Use  . Smoking status: Former Smoker    Packs/day: 0.10    Years: 35.00    Pack years: 3.50    Types: Cigarettes    Quit date: 04/08/2013    Years since quitting: 6.5  . Smokeless tobacco: Never Used  . Tobacco comment: smoked a few when her son died in 2013-01-25   Substance and Sexual Activity  . Alcohol use: No    Alcohol/week: 0.0 standard drinks  . Drug use: No  . Sexual activity: Never

## 2019-10-26 ENCOUNTER — Encounter: Payer: Self-pay | Admitting: Family Medicine

## 2019-11-06 ENCOUNTER — Ambulatory Visit (INDEPENDENT_AMBULATORY_CARE_PROVIDER_SITE_OTHER): Payer: Medicare Other | Admitting: Family Medicine

## 2019-11-06 ENCOUNTER — Other Ambulatory Visit: Payer: Self-pay

## 2019-11-06 ENCOUNTER — Encounter: Payer: Self-pay | Admitting: Family Medicine

## 2019-11-06 VITALS — BP 146/84 | HR 76 | Temp 97.2°F | Ht 63.75 in | Wt 252.0 lb

## 2019-11-06 DIAGNOSIS — I878 Other specified disorders of veins: Secondary | ICD-10-CM

## 2019-11-06 DIAGNOSIS — I10 Essential (primary) hypertension: Secondary | ICD-10-CM | POA: Diagnosis not present

## 2019-11-06 DIAGNOSIS — R5381 Other malaise: Secondary | ICD-10-CM | POA: Diagnosis not present

## 2019-11-06 DIAGNOSIS — R413 Other amnesia: Secondary | ICD-10-CM

## 2019-11-06 DIAGNOSIS — K219 Gastro-esophageal reflux disease without esophagitis: Secondary | ICD-10-CM

## 2019-11-06 LAB — COMPREHENSIVE METABOLIC PANEL
ALT: 16 U/L (ref 0–35)
AST: 16 U/L (ref 0–37)
Albumin: 4.1 g/dL (ref 3.5–5.2)
Alkaline Phosphatase: 115 U/L (ref 39–117)
BUN: 19 mg/dL (ref 6–23)
CO2: 24 mEq/L (ref 19–32)
Calcium: 9.6 mg/dL (ref 8.4–10.5)
Chloride: 109 mEq/L (ref 96–112)
Creatinine, Ser: 1.22 mg/dL — ABNORMAL HIGH (ref 0.40–1.20)
GFR: 51.55 mL/min — ABNORMAL LOW (ref >60.00)
Glucose, Bld: 90 mg/dL (ref 70–99)
Potassium: 4.3 mEq/L (ref 3.5–5.1)
Sodium: 143 mEq/L (ref 135–145)
Total Bilirubin: 0.4 mg/dL (ref 0.2–1.2)
Total Protein: 6.8 g/dL (ref 6.0–8.3)

## 2019-11-06 LAB — CBC
HCT: 39.6 % (ref 36.0–46.0)
Hemoglobin: 12.8 g/dL (ref 12.0–15.0)
MCHC: 32.3 g/dL (ref 30.0–36.0)
MCV: 97.4 fl (ref 78.0–100.0)
Platelets: 177 10*3/uL (ref 150.0–400.0)
RBC: 4.06 Mil/uL (ref 3.87–5.11)
RDW: 16.2 % — ABNORMAL HIGH (ref 11.5–15.5)
WBC: 6.5 10*3/uL (ref 4.0–10.5)

## 2019-11-06 LAB — VITAMIN B12: Vitamin B-12: 1098 pg/mL — ABNORMAL HIGH (ref 211–911)

## 2019-11-06 LAB — TSH: TSH: 3.55 u[IU]/mL (ref 0.35–4.50)

## 2019-11-06 MED ORDER — PANTOPRAZOLE SODIUM 40 MG PO TBEC: DELAYED_RELEASE_TABLET | ORAL | 2 refills | Status: DC

## 2019-11-06 NOTE — Assessment & Plan Note (Signed)
Stable.  Does not want diuretic due to urinary incontinence.  Recommended salt avoidance, elevation, and compression stockings.

## 2019-11-06 NOTE — Assessment & Plan Note (Signed)
Worsening.  Will check labs including CBC, C met, TSH, and B12.  Also check urine culture.  If labs are negative we will recheck MRI.

## 2019-11-06 NOTE — Patient Instructions (Addendum)
It was very nice to see you today!  We will check blood work and a urine sample.  We will need to get an MRI depending on the results.  Take care, Dr [name]  Please try these tips to maintain a healthy lifestyle:   Eat at least 3 REAL meals and 1-2 snacks per day.  Aim for no more than 5 hours between eating.  If you eat breakfast, please do so within one hour of getting up.    Each meal should contain half fruits/vegetables, one quarter protein, and one quarter carbs (no bigger than a computer mouse)   Cut down on sweet beverages. This includes juice, soda, and sweet tea.     Drink at least 1 glass of water with each meal and aim for at least 8 glasses per day   Exercise at least 150 minutes every week.

## 2019-11-06 NOTE — Assessment & Plan Note (Signed)
At goal per JNC 8.  Continue losartan 100 mg daily.

## 2019-11-06 NOTE — Assessment & Plan Note (Signed)
Patient with a few falls within the past year.  Will place referral to physical therapy per request.

## 2019-11-06 NOTE — Assessment & Plan Note (Signed)
Decrease Protonix to 40 mg once daily.

## 2019-11-06 NOTE — Progress Notes (Signed)
   [name] is a 79 y.o. female who presents today for an office visit.  History provided by patient's daughter.   Assessment/Plan:  Chronic Problems Addressed Today: Debility Patient with a few falls within the past year.  Will place referral to physical therapy per request.  GERD Decrease Protonix to 40 mg once daily.  Essential hypertension At goal per JNC 8.  Continue losartan 100 mg daily.  Venous stasis of lower extremity Stable.  Does not want diuretic due to urinary incontinence.  Recommended salt avoidance, elevation, and compression stockings.  Memory disorder Worsening.  Will check labs including CBC, C met, TSH, and B12.  Also check urine culture.  If labs are negative we will recheck MRI.     Subjective:  HPI:  Patient's daughter has noticed a change in her mental status within the last couple of months.  She has been having increased confusion.  Difficulty carrying on conversation.  She has also had difficulty remembering things that she has done.  She has had increased urinary and stool incontinence as well.  Saw her orthopedist who increased her dose of HCTZ.  She stopped this within the last couple weeks due to excessive urination.  She has ongoing lower extremity swelling.  Symptoms seem to be at baseline.  They are trying compression stockings with modest improvement.       Objective:  Physical Exam: BP (!) 146/84   Pulse 76   Temp (!) 97.2 F (36.2 C)   Ht 5' 3.75" (1.619 m)   Wt 252 lb (114.3 kg)   SpO2 99%   BMI 43.60 kg/m   Gen: No acute distress, resting comfortably CV: Regular rate and rhythm with no murmurs appreciated Pulm: Normal work of breathing, clear to auscultation bilaterally with no crackles, wheezes, or rhonchi MSK: Bilateral venous stasis changes noted. Neuro: Grossly normal, moves all extremities Psych: Normal affect and thought content      Shelby Miranda M. [name], MD 11/06/2019 1:19 PM

## 2019-11-07 LAB — URINE CULTURE
MICRO NUMBER:: 10018116
SPECIMEN QUALITY:: ADEQUATE

## 2019-11-10 NOTE — Progress Notes (Signed)
Please inform patient of the following:  Blood work and urine sample are NORMAL. Recommend we proceed with brain MRi as we discussed. Please place order for brain MRI without contrast.

## 2019-11-11 ENCOUNTER — Other Ambulatory Visit: Payer: Self-pay

## 2019-11-11 DIAGNOSIS — R413 Other amnesia: Secondary | ICD-10-CM

## 2019-11-11 NOTE — Addendum Note (Signed)
Addended by: Loralyn Freshwater on: 11/11/2019 11:32 AM   Modules accepted: Orders

## 2019-11-11 NOTE — Progress Notes (Signed)
amb  

## 2019-11-12 DIAGNOSIS — I130 Hypertensive heart and chronic kidney disease with heart failure and stage 1 through stage 4 chronic kidney disease, or unspecified chronic kidney disease: Secondary | ICD-10-CM | POA: Diagnosis not present

## 2019-11-12 DIAGNOSIS — Z96643 Presence of artificial hip joint, bilateral: Secondary | ICD-10-CM | POA: Diagnosis not present

## 2019-11-12 DIAGNOSIS — K219 Gastro-esophageal reflux disease without esophagitis: Secondary | ICD-10-CM | POA: Diagnosis not present

## 2019-11-12 DIAGNOSIS — I5032 Chronic diastolic (congestive) heart failure: Secondary | ICD-10-CM | POA: Diagnosis not present

## 2019-11-12 DIAGNOSIS — Z9181 History of falling: Secondary | ICD-10-CM | POA: Diagnosis not present

## 2019-11-12 DIAGNOSIS — Z9989 Dependence on other enabling machines and devices: Secondary | ICD-10-CM | POA: Diagnosis not present

## 2019-11-12 DIAGNOSIS — M1990 Unspecified osteoarthritis, unspecified site: Secondary | ICD-10-CM | POA: Diagnosis not present

## 2019-11-12 DIAGNOSIS — J45909 Unspecified asthma, uncomplicated: Secondary | ICD-10-CM | POA: Diagnosis not present

## 2019-11-12 DIAGNOSIS — G4733 Obstructive sleep apnea (adult) (pediatric): Secondary | ICD-10-CM | POA: Diagnosis not present

## 2019-11-12 DIAGNOSIS — N183 Chronic kidney disease, stage 3 unspecified: Secondary | ICD-10-CM | POA: Diagnosis not present

## 2019-11-12 DIAGNOSIS — I872 Venous insufficiency (chronic) (peripheral): Secondary | ICD-10-CM | POA: Diagnosis not present

## 2019-11-13 ENCOUNTER — Telehealth: Payer: Self-pay | Admitting: Family Medicine

## 2019-11-13 NOTE — Telephone Encounter (Signed)
Ok to take those medications togethers.  [name]. [name], MD 11/13/2019 3:49 PM

## 2019-11-13 NOTE — Telephone Encounter (Signed)
Physical therapist named Shanon Brow from [facility] called requesting verbal orders for plan of treatment. His anticipated frequency is the initial one day for one week following with twice a week for 5 weeks. The 5 weeks will be focusing on balance, strength, endurance, and fall  Prevention. Shanon Brow also wanted to make Dr. [name] aware that his system gave him an interaction warning when inputting pt medications. It was a level 2 potential interaction warning between Haloperidol .5mg  and Hydroxyzine 25mg . Please call Shanon Brow back at [phone].

## 2019-11-13 NOTE — Telephone Encounter (Signed)
Notified Shanon Brow ok for verbal. FYI for medication interaction

## 2019-11-14 NOTE — Telephone Encounter (Signed)
Noted  

## 2019-11-18 ENCOUNTER — Other Ambulatory Visit: Payer: Self-pay

## 2019-11-19 ENCOUNTER — Encounter: Payer: Self-pay | Admitting: Family Medicine

## 2019-11-19 DIAGNOSIS — I130 Hypertensive heart and chronic kidney disease with heart failure and stage 1 through stage 4 chronic kidney disease, or unspecified chronic kidney disease: Secondary | ICD-10-CM | POA: Diagnosis not present

## 2019-11-19 DIAGNOSIS — K219 Gastro-esophageal reflux disease without esophagitis: Secondary | ICD-10-CM | POA: Diagnosis not present

## 2019-11-19 DIAGNOSIS — J45909 Unspecified asthma, uncomplicated: Secondary | ICD-10-CM | POA: Diagnosis not present

## 2019-11-19 DIAGNOSIS — G4733 Obstructive sleep apnea (adult) (pediatric): Secondary | ICD-10-CM | POA: Diagnosis not present

## 2019-11-19 DIAGNOSIS — I872 Venous insufficiency (chronic) (peripheral): Secondary | ICD-10-CM | POA: Diagnosis not present

## 2019-11-19 DIAGNOSIS — I5032 Chronic diastolic (congestive) heart failure: Secondary | ICD-10-CM | POA: Diagnosis not present

## 2019-11-19 DIAGNOSIS — Z96643 Presence of artificial hip joint, bilateral: Secondary | ICD-10-CM | POA: Diagnosis not present

## 2019-11-19 DIAGNOSIS — N183 Chronic kidney disease, stage 3 unspecified: Secondary | ICD-10-CM | POA: Diagnosis not present

## 2019-11-19 DIAGNOSIS — M1990 Unspecified osteoarthritis, unspecified site: Secondary | ICD-10-CM | POA: Diagnosis not present

## 2019-11-19 DIAGNOSIS — Z9989 Dependence on other enabling machines and devices: Secondary | ICD-10-CM | POA: Diagnosis not present

## 2019-11-19 DIAGNOSIS — Z9181 History of falling: Secondary | ICD-10-CM | POA: Diagnosis not present

## 2019-11-22 MED ORDER — AMLODIPINE BESYLATE 5 MG PO TABS: 5.0000 mg | ORAL_TABLET | Freq: Every day | ORAL | 1 refills | Status: DC

## 2019-11-25 ENCOUNTER — Encounter: Payer: Self-pay | Admitting: Family Medicine

## 2019-11-26 DIAGNOSIS — I872 Venous insufficiency (chronic) (peripheral): Secondary | ICD-10-CM | POA: Diagnosis not present

## 2019-11-26 DIAGNOSIS — Z96643 Presence of artificial hip joint, bilateral: Secondary | ICD-10-CM | POA: Diagnosis not present

## 2019-11-26 DIAGNOSIS — Z9989 Dependence on other enabling machines and devices: Secondary | ICD-10-CM | POA: Diagnosis not present

## 2019-11-26 DIAGNOSIS — M1990 Unspecified osteoarthritis, unspecified site: Secondary | ICD-10-CM | POA: Diagnosis not present

## 2019-11-26 DIAGNOSIS — Z9181 History of falling: Secondary | ICD-10-CM | POA: Diagnosis not present

## 2019-11-26 DIAGNOSIS — I130 Hypertensive heart and chronic kidney disease with heart failure and stage 1 through stage 4 chronic kidney disease, or unspecified chronic kidney disease: Secondary | ICD-10-CM | POA: Diagnosis not present

## 2019-11-26 DIAGNOSIS — N183 Chronic kidney disease, stage 3 unspecified: Secondary | ICD-10-CM | POA: Diagnosis not present

## 2019-11-26 DIAGNOSIS — K219 Gastro-esophageal reflux disease without esophagitis: Secondary | ICD-10-CM | POA: Diagnosis not present

## 2019-11-26 DIAGNOSIS — G4733 Obstructive sleep apnea (adult) (pediatric): Secondary | ICD-10-CM | POA: Diagnosis not present

## 2019-11-26 DIAGNOSIS — J45909 Unspecified asthma, uncomplicated: Secondary | ICD-10-CM | POA: Diagnosis not present

## 2019-11-26 DIAGNOSIS — I5032 Chronic diastolic (congestive) heart failure: Secondary | ICD-10-CM | POA: Diagnosis not present

## 2019-11-27 DIAGNOSIS — K219 Gastro-esophageal reflux disease without esophagitis: Secondary | ICD-10-CM | POA: Diagnosis not present

## 2019-11-27 DIAGNOSIS — J45909 Unspecified asthma, uncomplicated: Secondary | ICD-10-CM | POA: Diagnosis not present

## 2019-11-27 DIAGNOSIS — N183 Chronic kidney disease, stage 3 unspecified: Secondary | ICD-10-CM | POA: Diagnosis not present

## 2019-11-27 DIAGNOSIS — Z9181 History of falling: Secondary | ICD-10-CM | POA: Diagnosis not present

## 2019-11-27 DIAGNOSIS — I130 Hypertensive heart and chronic kidney disease with heart failure and stage 1 through stage 4 chronic kidney disease, or unspecified chronic kidney disease: Secondary | ICD-10-CM | POA: Diagnosis not present

## 2019-11-27 DIAGNOSIS — Z9989 Dependence on other enabling machines and devices: Secondary | ICD-10-CM | POA: Diagnosis not present

## 2019-11-27 DIAGNOSIS — Z96643 Presence of artificial hip joint, bilateral: Secondary | ICD-10-CM | POA: Diagnosis not present

## 2019-11-27 DIAGNOSIS — I872 Venous insufficiency (chronic) (peripheral): Secondary | ICD-10-CM | POA: Diagnosis not present

## 2019-11-27 DIAGNOSIS — M1990 Unspecified osteoarthritis, unspecified site: Secondary | ICD-10-CM | POA: Diagnosis not present

## 2019-11-27 DIAGNOSIS — G4733 Obstructive sleep apnea (adult) (pediatric): Secondary | ICD-10-CM | POA: Diagnosis not present

## 2019-11-27 DIAGNOSIS — I5032 Chronic diastolic (congestive) heart failure: Secondary | ICD-10-CM | POA: Diagnosis not present

## 2019-11-28 ENCOUNTER — Ambulatory Visit: Payer: Medicare Other | Admitting: Orthopaedic Surgery

## 2019-12-01 ENCOUNTER — Ambulatory Visit (INDEPENDENT_AMBULATORY_CARE_PROVIDER_SITE_OTHER): Payer: Medicare Other | Admitting: Psychiatry

## 2019-12-01 ENCOUNTER — Other Ambulatory Visit: Payer: Self-pay

## 2019-12-01 ENCOUNTER — Encounter (HOSPITAL_COMMUNITY): Payer: Self-pay | Admitting: Psychiatry

## 2019-12-01 DIAGNOSIS — I130 Hypertensive heart and chronic kidney disease with heart failure and stage 1 through stage 4 chronic kidney disease, or unspecified chronic kidney disease: Secondary | ICD-10-CM | POA: Diagnosis not present

## 2019-12-01 DIAGNOSIS — Z96643 Presence of artificial hip joint, bilateral: Secondary | ICD-10-CM | POA: Diagnosis not present

## 2019-12-01 DIAGNOSIS — N183 Chronic kidney disease, stage 3 unspecified: Secondary | ICD-10-CM | POA: Diagnosis not present

## 2019-12-01 DIAGNOSIS — G4733 Obstructive sleep apnea (adult) (pediatric): Secondary | ICD-10-CM | POA: Diagnosis not present

## 2019-12-01 DIAGNOSIS — J45909 Unspecified asthma, uncomplicated: Secondary | ICD-10-CM | POA: Diagnosis not present

## 2019-12-01 DIAGNOSIS — F200 Paranoid schizophrenia: Secondary | ICD-10-CM | POA: Diagnosis not present

## 2019-12-01 DIAGNOSIS — Z9989 Dependence on other enabling machines and devices: Secondary | ICD-10-CM | POA: Diagnosis not present

## 2019-12-01 DIAGNOSIS — F411 Generalized anxiety disorder: Secondary | ICD-10-CM | POA: Diagnosis not present

## 2019-12-01 DIAGNOSIS — K219 Gastro-esophageal reflux disease without esophagitis: Secondary | ICD-10-CM | POA: Diagnosis not present

## 2019-12-01 DIAGNOSIS — M1990 Unspecified osteoarthritis, unspecified site: Secondary | ICD-10-CM | POA: Diagnosis not present

## 2019-12-01 DIAGNOSIS — I5032 Chronic diastolic (congestive) heart failure: Secondary | ICD-10-CM | POA: Diagnosis not present

## 2019-12-01 DIAGNOSIS — I872 Venous insufficiency (chronic) (peripheral): Secondary | ICD-10-CM | POA: Diagnosis not present

## 2019-12-01 DIAGNOSIS — Z9181 History of falling: Secondary | ICD-10-CM | POA: Diagnosis not present

## 2019-12-01 MED ORDER — BUPROPION HCL ER (XL) 150 MG PO TB24: 150.0000 mg | ORAL_TABLET | Freq: Every morning | ORAL | 0 refills | Status: DC

## 2019-12-01 MED ORDER — HALOPERIDOL 0.5 MG PO TABS: ORAL_TABLET | ORAL | 0 refills | Status: DC

## 2019-12-01 NOTE — Progress Notes (Signed)
Virtual Visit via Telephone Note  I connected with [name] on 12/01/19 at  4:00 PM EST by telephone and verified that I am speaking with the correct person using two identifiers.   I discussed the limitations, risks, security and privacy concerns of performing an evaluation and management service by telephone and the availability of in person appointments. I also discussed with the patient that there may be a patient responsible charge related to this service. The patient expressed understanding and agreed to proceed.   History of Present Illness: Patient was evaluated through phone session.  She is a poor historian but able to explain her symptoms.  I also spoke to her daughter who is involved in her treatment.  Her daughter reported that patient had issues with physical therapist and did not like working with her.  Patient also preoccupied with her somatic complaints and tends to get easily irritable and frustrated.  Her thought process gets circumstantial but she reported lately not hearing any voices or having any suicidal thoughts.  Her daughter reported sometimes she talks to her deceased dog but did not get violent or aggressive.  Patient endorsed having paranoia but her condition fluctuates on and off.  Her daughter endorsed that it is unclear what triggers her mood but there are times she gets confrontational.  Patient son lives with the but usually comes late.  Her daughter is organizes her medication.  Patient also had home health aide who comes and help the patient.  Recently patient seen her PCP who had refilled her hydroxyzine and Wellbutrin.  Patient has no tremors, shakes or any EPS.  She is sleeping okay and her appetite is okay.   Past Psychiatric History:Reviewed. H/O inpatient when try to jump off from the window and her husband saved her. Admitted at [facility] for 3 weeks. Seen in this office since 2013. Haldol helped.    Recent Results (from the past 2160 hour(s))  Urine  culture     Status: None   Collection Time: 11/06/19 11:58 AM   Specimen: Urine  Result Value Ref Range   MICRO NUMBER: [id number]    SPECIMEN QUALITY: Adequate    Sample Source NOT GIVEN    STATUS: FINAL    ISOLATE 1:      Growth of mixed flora was isolated, suggesting probable contamination. No further testing will be performed. If clinically indicated, recollection using a method to minimize contamination, with prompt transfer to Urine Culture Transport Tube, is  recommended.   CBC     Status: Abnormal   Collection Time: 11/06/19 11:58 AM  Result Value Ref Range   WBC 6.5 4.0 - 10.5 K/uL   RBC 4.06 3.87 - 5.11 Mil/uL   Platelets 177.0 150.0 - 400.0 K/uL   Hemoglobin 12.8 12.0 - 15.0 g/dL   HCT 39.6 36.0 - 46.0 %   MCV 97.4 78.0 - 100.0 fl   MCHC 32.3 30.0 - 36.0 g/dL   RDW 16.2 (H) 11.5 - 15.5 %  CMET     Status: Abnormal   Collection Time: 11/06/19 11:58 AM  Result Value Ref Range   Sodium 143 135 - 145 mEq/L   Potassium 4.3 3.5 - 5.1 mEq/L   Chloride 109 96 - 112 mEq/L   CO2 24 19 - 32 mEq/L   Glucose, Bld 90 70 - 99 mg/dL   BUN 19 6 - 23 mg/dL   Creatinine, Ser 1.22 (H) 0.40 - 1.20 mg/dL   Total Bilirubin 0.4 0.2 - 1.2  mg/dL   Alkaline Phosphatase 115 39 - 117 U/L   AST 16 0 - 37 U/L   ALT 16 0 - 35 U/L   Total Protein 6.8 6.0 - 8.3 g/dL   Albumin 4.1 3.5 - 5.2 g/dL   GFR 51.55 (L) >60.00 mL/min   Calcium 9.6 8.4 - 10.5 mg/dL  TSH     Status: None   Collection Time: 11/06/19 11:58 AM  Result Value Ref Range   TSH 3.55 0.35 - 4.50 uIU/mL  Vitamin B12     Status: Abnormal   Collection Time: 11/06/19 11:58 AM  Result Value Ref Range   Vitamin B-12 1,098 (H) 211 - 911 pg/mL      Psychiatric Specialty Exam: Physical Exam  Review of Systems  There were no vitals taken for this visit.There is no height or weight on file to calculate BMI.  General Appearance: NA  Eye Contact:  NA  Speech:  fast and rambling  Volume:  Normal  Mood:  Irritable  Affect:  NA   Thought Process:  Descriptions of Associations: Circumstantial  Orientation:  NA  Thought Content:  Paranoid Ideation and circumstantial  Suicidal Thoughts:  No  Homicidal Thoughts:  No  Memory:  Immediate;   Fair Recent;   Fair Remote;   Poor  Judgement:  Fair  Insight:  Fair  Psychomotor Activity:  NA  Concentration:  Concentration: Fair and Attention Span: Fair  Recall:  [employer] of Knowledge:  Fair  Language:  Fair  Akathisia:  No  Handed:  Right  AIMS (if indicated):     Assets:  Housing Social Support  ADL's:  Intact  Cognition:  WNL  Sleep:   fair      Assessment and Plan: Schizophrenia chronic paranoid type.  Generalized anxiety disorder.  I reviewed blood work results.  Her creatinine is 1.22.  She has not taken extra Haldol since the last visit.  I recommend to try reducing Wellbutrin XL to 150 if this is causing episodic irritability.  Patient's daughter will try reducing Wellbutrin and I will send a new prescription to her pharmacy.  Continue hydroxyzine 25 mg at bedtime prescribed by PCP.  Continue Haldol 0.5 at bedtime.  Recommended to call us back if she is any question of any concern.  Follow-up in 3 months.  Follow Up Instructions:    I discussed the assessment and treatment plan with the patient. The patient was provided an opportunity to ask questions and all were answered. The patient agreed with the plan and demonstrated an understanding of the instructions.   The patient was advised to call back or seek an in-person evaluation if the symptoms worsen or if the condition fails to improve as anticipated.  I provided 20 minutes of non-face-to-face time during this encounter.   Kathlee Nations, MD

## 2019-12-04 DIAGNOSIS — N183 Chronic kidney disease, stage 3 unspecified: Secondary | ICD-10-CM | POA: Diagnosis not present

## 2019-12-04 DIAGNOSIS — Z9989 Dependence on other enabling machines and devices: Secondary | ICD-10-CM | POA: Diagnosis not present

## 2019-12-04 DIAGNOSIS — G4733 Obstructive sleep apnea (adult) (pediatric): Secondary | ICD-10-CM | POA: Diagnosis not present

## 2019-12-04 DIAGNOSIS — Z96643 Presence of artificial hip joint, bilateral: Secondary | ICD-10-CM | POA: Diagnosis not present

## 2019-12-04 DIAGNOSIS — I5032 Chronic diastolic (congestive) heart failure: Secondary | ICD-10-CM | POA: Diagnosis not present

## 2019-12-04 DIAGNOSIS — Z9181 History of falling: Secondary | ICD-10-CM | POA: Diagnosis not present

## 2019-12-04 DIAGNOSIS — I130 Hypertensive heart and chronic kidney disease with heart failure and stage 1 through stage 4 chronic kidney disease, or unspecified chronic kidney disease: Secondary | ICD-10-CM | POA: Diagnosis not present

## 2019-12-04 DIAGNOSIS — K219 Gastro-esophageal reflux disease without esophagitis: Secondary | ICD-10-CM | POA: Diagnosis not present

## 2019-12-04 DIAGNOSIS — I872 Venous insufficiency (chronic) (peripheral): Secondary | ICD-10-CM | POA: Diagnosis not present

## 2019-12-04 DIAGNOSIS — M1990 Unspecified osteoarthritis, unspecified site: Secondary | ICD-10-CM | POA: Diagnosis not present

## 2019-12-04 DIAGNOSIS — J45909 Unspecified asthma, uncomplicated: Secondary | ICD-10-CM | POA: Diagnosis not present

## 2019-12-09 DIAGNOSIS — K219 Gastro-esophageal reflux disease without esophagitis: Secondary | ICD-10-CM | POA: Diagnosis not present

## 2019-12-09 DIAGNOSIS — M1990 Unspecified osteoarthritis, unspecified site: Secondary | ICD-10-CM | POA: Diagnosis not present

## 2019-12-09 DIAGNOSIS — Z9989 Dependence on other enabling machines and devices: Secondary | ICD-10-CM | POA: Diagnosis not present

## 2019-12-09 DIAGNOSIS — N183 Chronic kidney disease, stage 3 unspecified: Secondary | ICD-10-CM | POA: Diagnosis not present

## 2019-12-09 DIAGNOSIS — G4733 Obstructive sleep apnea (adult) (pediatric): Secondary | ICD-10-CM | POA: Diagnosis not present

## 2019-12-09 DIAGNOSIS — Z96643 Presence of artificial hip joint, bilateral: Secondary | ICD-10-CM | POA: Diagnosis not present

## 2019-12-09 DIAGNOSIS — J45909 Unspecified asthma, uncomplicated: Secondary | ICD-10-CM | POA: Diagnosis not present

## 2019-12-09 DIAGNOSIS — Z9181 History of falling: Secondary | ICD-10-CM | POA: Diagnosis not present

## 2019-12-09 DIAGNOSIS — I5032 Chronic diastolic (congestive) heart failure: Secondary | ICD-10-CM | POA: Diagnosis not present

## 2019-12-09 DIAGNOSIS — I872 Venous insufficiency (chronic) (peripheral): Secondary | ICD-10-CM | POA: Diagnosis not present

## 2019-12-09 DIAGNOSIS — I130 Hypertensive heart and chronic kidney disease with heart failure and stage 1 through stage 4 chronic kidney disease, or unspecified chronic kidney disease: Secondary | ICD-10-CM | POA: Diagnosis not present

## 2019-12-12 DIAGNOSIS — N183 Chronic kidney disease, stage 3 unspecified: Secondary | ICD-10-CM | POA: Diagnosis not present

## 2019-12-12 DIAGNOSIS — J45909 Unspecified asthma, uncomplicated: Secondary | ICD-10-CM | POA: Diagnosis not present

## 2019-12-12 DIAGNOSIS — M1990 Unspecified osteoarthritis, unspecified site: Secondary | ICD-10-CM | POA: Diagnosis not present

## 2019-12-12 DIAGNOSIS — Z9989 Dependence on other enabling machines and devices: Secondary | ICD-10-CM | POA: Diagnosis not present

## 2019-12-12 DIAGNOSIS — I130 Hypertensive heart and chronic kidney disease with heart failure and stage 1 through stage 4 chronic kidney disease, or unspecified chronic kidney disease: Secondary | ICD-10-CM | POA: Diagnosis not present

## 2019-12-12 DIAGNOSIS — I5032 Chronic diastolic (congestive) heart failure: Secondary | ICD-10-CM | POA: Diagnosis not present

## 2019-12-12 DIAGNOSIS — K219 Gastro-esophageal reflux disease without esophagitis: Secondary | ICD-10-CM | POA: Diagnosis not present

## 2019-12-12 DIAGNOSIS — G4733 Obstructive sleep apnea (adult) (pediatric): Secondary | ICD-10-CM | POA: Diagnosis not present

## 2019-12-12 DIAGNOSIS — I872 Venous insufficiency (chronic) (peripheral): Secondary | ICD-10-CM | POA: Diagnosis not present

## 2019-12-12 DIAGNOSIS — Z9181 History of falling: Secondary | ICD-10-CM | POA: Diagnosis not present

## 2019-12-12 DIAGNOSIS — Z96643 Presence of artificial hip joint, bilateral: Secondary | ICD-10-CM | POA: Diagnosis not present

## 2019-12-13 ENCOUNTER — Other Ambulatory Visit: Payer: Medicare Other

## 2019-12-15 ENCOUNTER — Other Ambulatory Visit: Payer: Self-pay | Admitting: Family Medicine

## 2019-12-15 DIAGNOSIS — R413 Other amnesia: Secondary | ICD-10-CM

## 2019-12-16 DIAGNOSIS — Z9181 History of falling: Secondary | ICD-10-CM | POA: Diagnosis not present

## 2019-12-16 DIAGNOSIS — Z96643 Presence of artificial hip joint, bilateral: Secondary | ICD-10-CM | POA: Diagnosis not present

## 2019-12-16 DIAGNOSIS — M1990 Unspecified osteoarthritis, unspecified site: Secondary | ICD-10-CM | POA: Diagnosis not present

## 2019-12-16 DIAGNOSIS — I130 Hypertensive heart and chronic kidney disease with heart failure and stage 1 through stage 4 chronic kidney disease, or unspecified chronic kidney disease: Secondary | ICD-10-CM | POA: Diagnosis not present

## 2019-12-16 DIAGNOSIS — G4733 Obstructive sleep apnea (adult) (pediatric): Secondary | ICD-10-CM | POA: Diagnosis not present

## 2019-12-16 DIAGNOSIS — I5032 Chronic diastolic (congestive) heart failure: Secondary | ICD-10-CM | POA: Diagnosis not present

## 2019-12-16 DIAGNOSIS — Z9989 Dependence on other enabling machines and devices: Secondary | ICD-10-CM | POA: Diagnosis not present

## 2019-12-16 DIAGNOSIS — N183 Chronic kidney disease, stage 3 unspecified: Secondary | ICD-10-CM | POA: Diagnosis not present

## 2019-12-16 DIAGNOSIS — K219 Gastro-esophageal reflux disease without esophagitis: Secondary | ICD-10-CM | POA: Diagnosis not present

## 2019-12-16 DIAGNOSIS — I872 Venous insufficiency (chronic) (peripheral): Secondary | ICD-10-CM | POA: Diagnosis not present

## 2019-12-16 DIAGNOSIS — J45909 Unspecified asthma, uncomplicated: Secondary | ICD-10-CM | POA: Diagnosis not present

## 2019-12-17 ENCOUNTER — Telehealth: Payer: Self-pay | Admitting: Family Medicine

## 2019-12-17 NOTE — Telephone Encounter (Signed)
Spoke to [employer] gave verbal for pt.   Called daughter recommended that due to symptoms given by PT that she go to urgent care for evaluation before weather comes in tonight. Daughter refused. States that she does not want to take her there to get sick. I have made app for in our office for Friday. Patient needs to be evaluated in person. If we are open tomorrow I will call and have her seen if able to get here. Reviewed all red words with daughter and had repeat back to me. If any red words she agreed to take her to ED or call 911.   Also wanted to let you know that her MRI has been pushed back to 12/29/2019 due to weather this time. Is that ok or do we need to call and see if we can get sooner?

## 2019-12-17 NOTE — Telephone Encounter (Signed)
Please advise 

## 2019-12-17 NOTE — Telephone Encounter (Signed)
Ok to send Home health. Recommend OV if she is not having any covid symptoms.  Algis Greenhouse. [name], MD 12/17/2019 11:10 AM

## 2019-12-17 NOTE — Telephone Encounter (Signed)
Due to the inclement weather, and everything going on with the patient is it okay to schedule in a virtual slot on Monday?

## 2019-12-17 NOTE — Telephone Encounter (Signed)
Patients daughter called in this morning, stating that her at home therapist listened to her lungs yesterday and heard some rattling, the therapist recommenced that they schedule an ov to rule out pneumonia and to check her lungs. Is it okay to schedule a visit?

## 2019-12-17 NOTE — Telephone Encounter (Signed)
[facility] called in, and said he noticed some rattling in the ride side of the chest, and also states he may be concerned for heart failure. Shanon Brow asked to extend home health for extended 2 times a week for 3/4 weeks and there was a 2.4 weight gain over a 5 day period.   [id number]

## 2019-12-18 ENCOUNTER — Ambulatory Visit: Payer: Medicare Other | Admitting: Family Medicine

## 2019-12-19 ENCOUNTER — Ambulatory Visit (INDEPENDENT_AMBULATORY_CARE_PROVIDER_SITE_OTHER): Payer: Medicare Other | Admitting: Family Medicine

## 2019-12-19 ENCOUNTER — Other Ambulatory Visit: Payer: Self-pay

## 2019-12-19 ENCOUNTER — Ambulatory Visit (INDEPENDENT_AMBULATORY_CARE_PROVIDER_SITE_OTHER): Payer: Medicare Other

## 2019-12-19 ENCOUNTER — Encounter: Payer: Self-pay | Admitting: Family Medicine

## 2019-12-19 VITALS — BP 171/71 | HR 86 | Temp 98.1°F | Ht 63.25 in | Wt 255.2 lb

## 2019-12-19 DIAGNOSIS — I10 Essential (primary) hypertension: Secondary | ICD-10-CM

## 2019-12-19 DIAGNOSIS — Z96643 Presence of artificial hip joint, bilateral: Secondary | ICD-10-CM | POA: Diagnosis not present

## 2019-12-19 DIAGNOSIS — Z9989 Dependence on other enabling machines and devices: Secondary | ICD-10-CM | POA: Diagnosis not present

## 2019-12-19 DIAGNOSIS — M1990 Unspecified osteoarthritis, unspecified site: Secondary | ICD-10-CM | POA: Diagnosis not present

## 2019-12-19 DIAGNOSIS — R059 Cough, unspecified: Secondary | ICD-10-CM

## 2019-12-19 DIAGNOSIS — Z9181 History of falling: Secondary | ICD-10-CM | POA: Diagnosis not present

## 2019-12-19 DIAGNOSIS — I130 Hypertensive heart and chronic kidney disease with heart failure and stage 1 through stage 4 chronic kidney disease, or unspecified chronic kidney disease: Secondary | ICD-10-CM | POA: Diagnosis not present

## 2019-12-19 DIAGNOSIS — N183 Chronic kidney disease, stage 3 unspecified: Secondary | ICD-10-CM | POA: Diagnosis not present

## 2019-12-19 DIAGNOSIS — K219 Gastro-esophageal reflux disease without esophagitis: Secondary | ICD-10-CM | POA: Diagnosis not present

## 2019-12-19 DIAGNOSIS — R600 Localized edema: Secondary | ICD-10-CM

## 2019-12-19 DIAGNOSIS — J45909 Unspecified asthma, uncomplicated: Secondary | ICD-10-CM | POA: Diagnosis not present

## 2019-12-19 DIAGNOSIS — R05 Cough: Secondary | ICD-10-CM

## 2019-12-19 DIAGNOSIS — G4733 Obstructive sleep apnea (adult) (pediatric): Secondary | ICD-10-CM | POA: Diagnosis not present

## 2019-12-19 DIAGNOSIS — I5032 Chronic diastolic (congestive) heart failure: Secondary | ICD-10-CM | POA: Diagnosis not present

## 2019-12-19 DIAGNOSIS — I872 Venous insufficiency (chronic) (peripheral): Secondary | ICD-10-CM | POA: Diagnosis not present

## 2019-12-19 MED ORDER — AZITHROMYCIN 250 MG PO TABS: ORAL_TABLET | ORAL | 0 refills | Status: DC

## 2019-12-19 MED ORDER — FUROSEMIDE 20 MG PO TABS: 20.0000 mg | ORAL_TABLET | Freq: Every day | ORAL | 3 refills | Status: DC | PRN

## 2019-12-19 MED ORDER — AMLODIPINE BESYLATE 10 MG PO TABS: 10.0000 mg | ORAL_TABLET | Freq: Every day | ORAL | 3 refills | Status: DC

## 2019-12-19 NOTE — Telephone Encounter (Signed)
Patient has appt will discuss at that time

## 2019-12-19 NOTE — Patient Instructions (Signed)
It was very nice to see you today!  I think you have bronchitis.  Please start the Z-Pak.  We will check a chest x-ray today.  Please increase your amlodipine to 10 mg daily.  Please also use the Lasix as needed for leg swelling.  Take care, Dr [name]  Please try these tips to maintain a healthy lifestyle:   Eat at least 3 REAL meals and 1-2 snacks per day.  Aim for no more than 5 hours between eating.  If you eat breakfast, please do so within one hour of getting up.    Each meal should contain half fruits/vegetables, one quarter protein, and one quarter carbs (no bigger than a computer mouse)   Cut down on sweet beverages. This includes juice, soda, and sweet tea.     Drink at least 1 glass of water with each meal and aim for at least 8 glasses per day   Exercise at least 150 minutes every week.

## 2019-12-19 NOTE — Progress Notes (Signed)
   [name] is a 79 y.o. female who presents today for an office visit.  Assessment/Plan:  New/Acute Problems: Cough No red flags.  Likely bronchitis.  Will send in azithromycin.  Will check chest x-ray to rule out pulmonary edema or pneumonia.  Discussed reasons to return to care.  Chronic Problems Addressed Today: Essential hypertension Above goal.  Continue losartan 100 mg daily.  Will increase amlodipine to 10 mg daily.  Continue home monitoring goal 150/90 or lower.  Lower extremity edema Continue furosemide 20 mg daily as needed.  Will send in refill today.     Subjective:  HPI:  Patient's home health nurse has noticed increasing weight and rattling in the right side of her chest for the past week or so.  Has had some cough and wheezing as well.  Tried taking OTc medications with modest improvement. Was getting better for a few days and then getting worse. No known sick contacts. No fevers or chills.        Objective:  Physical Exam: BP (!) 171/71   Pulse 86   Temp 98.1 F (36.7 C)   Ht 5' 3.25" (1.607 m)   Wt 255 lb 3.2 oz (115.8 kg)   SpO2 98%   BMI 44.85 kg/m   Wt Readings from Last 3 Encounters:  12/19/19 255 lb 3.2 oz (115.8 kg)  11/06/19 252 lb (114.3 kg)  10/14/19 245 lb (111.1 kg)    Gen: No acute distress, resting comfortably CV: Regular rate and rhythm with no murmurs appreciated Pulm: Normal work of breathing.  Rhonchi bilaterally.  Slight wheeze in the right lower base. Neuro: Grossly normal, moves all extremities Psych: Normal affect and thought content MSK: 2+ edema in bilateral lower extremities to mid shin.      Algis Greenhouse. [name], MD 12/19/2019 1:37 PM

## 2019-12-19 NOTE — Telephone Encounter (Signed)
Later MRI appointment is ok.  Algis Greenhouse. [name], MD 12/19/2019 9:51 AM

## 2019-12-19 NOTE — Assessment & Plan Note (Signed)
Continue furosemide 20 mg daily as needed.  Will send in refill today.

## 2019-12-19 NOTE — Assessment & Plan Note (Signed)
Above goal.  Continue losartan 100 mg daily.  Will increase amlodipine to 10 mg daily.  Continue home monitoring goal 150/90 or lower.

## 2019-12-22 DIAGNOSIS — I130 Hypertensive heart and chronic kidney disease with heart failure and stage 1 through stage 4 chronic kidney disease, or unspecified chronic kidney disease: Secondary | ICD-10-CM | POA: Diagnosis not present

## 2019-12-22 DIAGNOSIS — I872 Venous insufficiency (chronic) (peripheral): Secondary | ICD-10-CM | POA: Diagnosis not present

## 2019-12-22 DIAGNOSIS — K219 Gastro-esophageal reflux disease without esophagitis: Secondary | ICD-10-CM | POA: Diagnosis not present

## 2019-12-22 DIAGNOSIS — J45909 Unspecified asthma, uncomplicated: Secondary | ICD-10-CM | POA: Diagnosis not present

## 2019-12-22 DIAGNOSIS — I5032 Chronic diastolic (congestive) heart failure: Secondary | ICD-10-CM | POA: Diagnosis not present

## 2019-12-22 DIAGNOSIS — G4733 Obstructive sleep apnea (adult) (pediatric): Secondary | ICD-10-CM | POA: Diagnosis not present

## 2019-12-22 DIAGNOSIS — M1990 Unspecified osteoarthritis, unspecified site: Secondary | ICD-10-CM | POA: Diagnosis not present

## 2019-12-22 DIAGNOSIS — N183 Chronic kidney disease, stage 3 unspecified: Secondary | ICD-10-CM | POA: Diagnosis not present

## 2019-12-22 DIAGNOSIS — Z9181 History of falling: Secondary | ICD-10-CM | POA: Diagnosis not present

## 2019-12-22 DIAGNOSIS — Z96643 Presence of artificial hip joint, bilateral: Secondary | ICD-10-CM | POA: Diagnosis not present

## 2019-12-22 DIAGNOSIS — Z9989 Dependence on other enabling machines and devices: Secondary | ICD-10-CM | POA: Diagnosis not present

## 2019-12-22 NOTE — Progress Notes (Signed)
Please inform patient of the following:  Xray shows bronchitis and no pneumonia. Would like for her to finish her antibiotics and let us know if not improving.  Algis Greenhouse. [name], MD 12/22/2019 9:46 AM

## 2019-12-23 ENCOUNTER — Telehealth: Payer: Self-pay

## 2019-12-23 NOTE — Telephone Encounter (Signed)
Patient daughter is requesting Hydrocodone for hip pain, Tramadol is not working.She wants to know if this is ok to take due to kidneys. Please advise

## 2019-12-23 NOTE — Telephone Encounter (Signed)
-----   Message from Vivi Barrack, MD sent at 12/22/2019  9:46 AM EST ----- Please inform patient of the following:  Xray shows bronchitis and no pneumonia. Would like for her to finish her antibiotics and let us know if not improving.  Algis Greenhouse. [name], MD 12/22/2019 9:46 AM

## 2019-12-23 NOTE — Telephone Encounter (Signed)
Do not recommend hydrocodone at this time because it could potentially slow down her breathing and cause serious issues. Recommend sports med referral to discuss other pain management options.  Algis Greenhouse. [name], MD 12/23/2019 3:29 PM

## 2019-12-23 NOTE — Telephone Encounter (Signed)
Patient notified voices understanding 

## 2019-12-25 DIAGNOSIS — I130 Hypertensive heart and chronic kidney disease with heart failure and stage 1 through stage 4 chronic kidney disease, or unspecified chronic kidney disease: Secondary | ICD-10-CM | POA: Diagnosis not present

## 2019-12-25 DIAGNOSIS — I872 Venous insufficiency (chronic) (peripheral): Secondary | ICD-10-CM | POA: Diagnosis not present

## 2019-12-25 DIAGNOSIS — Z9989 Dependence on other enabling machines and devices: Secondary | ICD-10-CM | POA: Diagnosis not present

## 2019-12-25 DIAGNOSIS — Z9181 History of falling: Secondary | ICD-10-CM | POA: Diagnosis not present

## 2019-12-25 DIAGNOSIS — J45909 Unspecified asthma, uncomplicated: Secondary | ICD-10-CM | POA: Diagnosis not present

## 2019-12-25 DIAGNOSIS — N183 Chronic kidney disease, stage 3 unspecified: Secondary | ICD-10-CM | POA: Diagnosis not present

## 2019-12-25 DIAGNOSIS — I5032 Chronic diastolic (congestive) heart failure: Secondary | ICD-10-CM | POA: Diagnosis not present

## 2019-12-25 DIAGNOSIS — G4733 Obstructive sleep apnea (adult) (pediatric): Secondary | ICD-10-CM | POA: Diagnosis not present

## 2019-12-25 DIAGNOSIS — M1990 Unspecified osteoarthritis, unspecified site: Secondary | ICD-10-CM | POA: Diagnosis not present

## 2019-12-25 DIAGNOSIS — Z96643 Presence of artificial hip joint, bilateral: Secondary | ICD-10-CM | POA: Diagnosis not present

## 2019-12-25 DIAGNOSIS — K219 Gastro-esophageal reflux disease without esophagitis: Secondary | ICD-10-CM | POA: Diagnosis not present

## 2019-12-29 ENCOUNTER — Other Ambulatory Visit: Payer: Self-pay

## 2019-12-29 ENCOUNTER — Ambulatory Visit
Admission: RE | Admit: 2019-12-29 | Discharge: 2019-12-29 | Disposition: A | Discharge status: Home / Self Care | Payer: Medicare Other | Source: Ambulatory Visit | Attending: Family Medicine | Admitting: Family Medicine

## 2019-12-29 DIAGNOSIS — S0990XA Unspecified injury of head, initial encounter: Secondary | ICD-10-CM | POA: Diagnosis not present

## 2019-12-29 DIAGNOSIS — R413 Other amnesia: Secondary | ICD-10-CM

## 2019-12-30 NOTE — Progress Notes (Signed)
Please inform patient of the following:  MRI stable compared to last year.

## 2019-12-31 DIAGNOSIS — J45909 Unspecified asthma, uncomplicated: Secondary | ICD-10-CM | POA: Diagnosis not present

## 2019-12-31 DIAGNOSIS — M1990 Unspecified osteoarthritis, unspecified site: Secondary | ICD-10-CM | POA: Diagnosis not present

## 2019-12-31 DIAGNOSIS — N183 Chronic kidney disease, stage 3 unspecified: Secondary | ICD-10-CM | POA: Diagnosis not present

## 2019-12-31 DIAGNOSIS — G4733 Obstructive sleep apnea (adult) (pediatric): Secondary | ICD-10-CM | POA: Diagnosis not present

## 2019-12-31 DIAGNOSIS — Z96643 Presence of artificial hip joint, bilateral: Secondary | ICD-10-CM | POA: Diagnosis not present

## 2019-12-31 DIAGNOSIS — Z9989 Dependence on other enabling machines and devices: Secondary | ICD-10-CM | POA: Diagnosis not present

## 2019-12-31 DIAGNOSIS — I130 Hypertensive heart and chronic kidney disease with heart failure and stage 1 through stage 4 chronic kidney disease, or unspecified chronic kidney disease: Secondary | ICD-10-CM | POA: Diagnosis not present

## 2019-12-31 DIAGNOSIS — I5032 Chronic diastolic (congestive) heart failure: Secondary | ICD-10-CM | POA: Diagnosis not present

## 2019-12-31 DIAGNOSIS — Z9181 History of falling: Secondary | ICD-10-CM | POA: Diagnosis not present

## 2019-12-31 DIAGNOSIS — I872 Venous insufficiency (chronic) (peripheral): Secondary | ICD-10-CM | POA: Diagnosis not present

## 2019-12-31 DIAGNOSIS — K219 Gastro-esophageal reflux disease without esophagitis: Secondary | ICD-10-CM | POA: Diagnosis not present

## 2020-01-01 ENCOUNTER — Ambulatory Visit: Payer: Medicare Other | Attending: Internal Medicine

## 2020-01-01 DIAGNOSIS — Z23 Encounter for immunization: Secondary | ICD-10-CM

## 2020-01-01 NOTE — Progress Notes (Signed)
   Covid-19 Vaccination Clinic  Name:  PATRICIA PRUSHA    MRN: [id number] DOB: [birth year]  01/01/2020  Ms. Bickerstaff was observed post Covid-19 immunization for 15 minutes without incident. She was provided with Vaccine Information Sheet and instruction to access the V-Safe system.   Ms. Hugley was instructed to call 911 with any severe reactions post vaccine: [name] Difficulty breathing  . Swelling of face and throat  . A fast heartbeat  . A bad rash all over body  . Dizziness and weakness   Immunizations Administered    Name Date Dose VIS Date Route   Pfizer COVID-19 Vaccine 01/01/2020 11:27 AM 0.3 mL 10/10/2019 Intramuscular   Manufacturer: [place]   Lot: [id number]   [place]: [id number]

## 2020-01-02 ENCOUNTER — Ambulatory Visit: Payer: Medicare Other | Admitting: Family Medicine

## 2020-01-02 DIAGNOSIS — J45909 Unspecified asthma, uncomplicated: Secondary | ICD-10-CM | POA: Diagnosis not present

## 2020-01-02 DIAGNOSIS — K219 Gastro-esophageal reflux disease without esophagitis: Secondary | ICD-10-CM | POA: Diagnosis not present

## 2020-01-02 DIAGNOSIS — I5032 Chronic diastolic (congestive) heart failure: Secondary | ICD-10-CM | POA: Diagnosis not present

## 2020-01-02 DIAGNOSIS — Z96643 Presence of artificial hip joint, bilateral: Secondary | ICD-10-CM | POA: Diagnosis not present

## 2020-01-02 DIAGNOSIS — Z9181 History of falling: Secondary | ICD-10-CM | POA: Diagnosis not present

## 2020-01-02 DIAGNOSIS — I872 Venous insufficiency (chronic) (peripheral): Secondary | ICD-10-CM | POA: Diagnosis not present

## 2020-01-02 DIAGNOSIS — Z9989 Dependence on other enabling machines and devices: Secondary | ICD-10-CM | POA: Diagnosis not present

## 2020-01-02 DIAGNOSIS — G4733 Obstructive sleep apnea (adult) (pediatric): Secondary | ICD-10-CM | POA: Diagnosis not present

## 2020-01-02 DIAGNOSIS — N183 Chronic kidney disease, stage 3 unspecified: Secondary | ICD-10-CM | POA: Diagnosis not present

## 2020-01-02 DIAGNOSIS — M1990 Unspecified osteoarthritis, unspecified site: Secondary | ICD-10-CM | POA: Diagnosis not present

## 2020-01-02 DIAGNOSIS — I130 Hypertensive heart and chronic kidney disease with heart failure and stage 1 through stage 4 chronic kidney disease, or unspecified chronic kidney disease: Secondary | ICD-10-CM | POA: Diagnosis not present

## 2020-01-22 ENCOUNTER — Ambulatory Visit: Payer: Medicare Other | Admitting: Family Medicine

## 2020-01-27 ENCOUNTER — Ambulatory Visit: Payer: Medicare Other | Attending: Internal Medicine

## 2020-01-27 DIAGNOSIS — Z23 Encounter for immunization: Secondary | ICD-10-CM

## 2020-01-27 NOTE — Progress Notes (Signed)
   Covid-19 Vaccination Clinic  Name:  [name]    MRN: [id number] DOB: 1941/05/31  01/27/2020  [name] was observed post Covid-19 immunization for 15 minutes  without incident. She was provided with Vaccine Information Sheet and instruction to access the V-Safe system.   [name] was instructed to call 911 with any severe reactions post vaccine: [name] Difficulty breathing  . Swelling of face and throat  . A fast heartbeat  . A bad rash all over body  . Dizziness and weakness   Immunizations Administered    Name Date Dose VIS Date Route   Pfizer COVID-19 Vaccine 01/27/2020  4:40 PM 0.3 mL 10/10/2019 Intramuscular   Manufacturer: [place]   Lot: [id number]   [place]: [id number]

## 2020-02-01 ENCOUNTER — Other Ambulatory Visit (HOSPITAL_COMMUNITY): Payer: Self-pay | Admitting: Psychiatry

## 2020-02-01 DIAGNOSIS — F411 Generalized anxiety disorder: Secondary | ICD-10-CM

## 2020-02-01 DIAGNOSIS — F200 Paranoid schizophrenia: Secondary | ICD-10-CM

## 2020-02-25 ENCOUNTER — Other Ambulatory Visit: Payer: Self-pay

## 2020-02-25 ENCOUNTER — Ambulatory Visit (INDEPENDENT_AMBULATORY_CARE_PROVIDER_SITE_OTHER): Payer: Medicare Other | Admitting: Family Medicine

## 2020-02-25 ENCOUNTER — Encounter: Payer: Self-pay | Admitting: Family Medicine

## 2020-02-25 VITALS — BP 142/68 | HR 71 | Temp 98.0°F | Ht 63.25 in | Wt 263.2 lb

## 2020-02-25 DIAGNOSIS — R600 Localized edema: Secondary | ICD-10-CM

## 2020-02-25 DIAGNOSIS — I10 Essential (primary) hypertension: Secondary | ICD-10-CM

## 2020-02-25 DIAGNOSIS — R05 Cough: Secondary | ICD-10-CM

## 2020-02-25 DIAGNOSIS — R059 Cough, unspecified: Secondary | ICD-10-CM

## 2020-02-25 NOTE — Assessment & Plan Note (Signed)
Discussed importance of regular activity and leg elevation.  We will also continue furosemide 20 mg daily as needed.[name]

## 2020-02-25 NOTE — Progress Notes (Signed)
   [name] is a 79 y.o. female who presents today for an office visit.  Assessment/Plan:  New/Acute Problems: Cough Likely postnasal drip.  Recommended they restart Flonase.  Patient's daughter was agreeable.  Chronic Problems Addressed Today: Essential hypertension At goal per JNC-8.  Continue losartan 100 mg daily and amlodipine 10 mg daily.  Follow-up in 6 months.  Lower extremity edema Discussed importance of regular activity and leg elevation.  We will also continue furosemide 20 mg daily as needed..     Subjective:  HPI:  Patient here for follow-up.  She is with her daughter.  She had left ear pain that resolved after her daughter flushed her yourself water.  She is also still having lower extremity edema.  No other issues today.  She has been tolerating her chronic medications well without side effects.       Objective:  Physical Exam: BP (!) 142/68   Pulse 71   Temp 98 F (36.7 C)   Ht 5' 3.25" (1.607 m)   Wt 263 lb 3.2 oz (119.4 kg)   SpO2 96%   BMI 46.26 kg/m   Wt Readings from Last 3 Encounters:  02/25/20 263 lb 3.2 oz (119.4 kg)  12/19/19 255 lb 3.2 oz (115.8 kg)  11/06/19 252 lb (114.3 kg)  Gen: No acute distress, resting comfortably CV: Regular rate and rhythm with no murmurs appreciated Pulm: Normal work of breathing, clear to auscultation bilaterally with no crackles, wheezes, or rhonchi Neuro: Grossly normal, moves all extremities Psych: Normal affect and thought content      [name] M. [name], MD 02/25/2020 9:29 AM

## 2020-02-25 NOTE — Assessment & Plan Note (Signed)
At goal per JNC-8.  Continue losartan 100 mg daily and amlodipine 10 mg daily.  Follow-up in 6 months.

## 2020-02-25 NOTE — Patient Instructions (Signed)
It was very nice to see you today!  Please try using Flonase.  This should help with the cough.  Please stay as active as much as possible.  Come back in 6 months.  Next checkup, or sooner if needed.  Take care, Dr [name]  Please try these tips to maintain a healthy lifestyle:   Eat at least 3 REAL meals and 1-2 snacks per day.  Aim for no more than 5 hours between eating.  If you eat breakfast, please do so within one hour of getting up.    Each meal should contain half fruits/vegetables, one quarter protein, and one quarter carbs (no bigger than a computer mouse)   Cut down on sweet beverages. This includes juice, soda, and sweet tea.     Drink at least 1 glass of water with each meal and aim for at least 8 glasses per day   Exercise at least 150 minutes every week.

## 2020-02-26 ENCOUNTER — Other Ambulatory Visit (HOSPITAL_COMMUNITY): Payer: Self-pay | Admitting: Psychiatry

## 2020-02-26 ENCOUNTER — Other Ambulatory Visit: Payer: Self-pay | Admitting: Family Medicine

## 2020-02-26 DIAGNOSIS — F411 Generalized anxiety disorder: Secondary | ICD-10-CM

## 2020-02-26 DIAGNOSIS — F200 Paranoid schizophrenia: Secondary | ICD-10-CM

## 2020-03-01 ENCOUNTER — Encounter (HOSPITAL_COMMUNITY): Payer: Self-pay | Admitting: Psychiatry

## 2020-03-01 ENCOUNTER — Other Ambulatory Visit: Payer: Self-pay

## 2020-03-01 ENCOUNTER — Telehealth (INDEPENDENT_AMBULATORY_CARE_PROVIDER_SITE_OTHER): Payer: Medicare Other | Admitting: Psychiatry

## 2020-03-01 DIAGNOSIS — F200 Paranoid schizophrenia: Secondary | ICD-10-CM

## 2020-03-01 DIAGNOSIS — F411 Generalized anxiety disorder: Secondary | ICD-10-CM | POA: Diagnosis not present

## 2020-03-01 MED ORDER — HALOPERIDOL 0.5 MG PO TABS: ORAL_TABLET | ORAL | 0 refills | Status: DC

## 2020-03-01 MED ORDER — BUPROPION HCL ER (XL) 300 MG PO TB24: 300.0000 mg | ORAL_TABLET | Freq: Every morning | ORAL | 0 refills | Status: DC

## 2020-03-01 NOTE — Progress Notes (Signed)
Virtual Visit via Telephone Note  I connected with [name] on 03/01/20 at  4:00 PM EDT by telephone and verified that I am speaking with the correct person using two identifiers.   I discussed the limitations, risks, security and privacy concerns of performing an evaluation and management service by telephone and the availability of in person appointments. I also discussed with the patient that there may be a patient responsible charge related to this service. The patient expressed understanding and agreed to proceed.   History of Present Illness: Patient is evaluated by phone.  Her daughter was also close by.  On the last visit we cut down her Wellbutrin from 300-150 to see if that helps her irritability.  However her daughter reported that she has been more depressed, isolated, withdrawn and she went back to previous dose of 350.  She sleeps during the day but at night comes she gets somewhat irritable and labile.  She do not recall any recent hallucination of voices but she is more upset with her physical health.  She has complaining of leg swelling initially seen by PCP.  She denies any suicidal thoughts.  She endorsed chronic fatigue and lack of energy because of physical issues.  She do not recall any tremors shakes or any EPS.  Sometime she has difficulty remembering pain and difficulty expressing her symptoms.  Her daughter is involved in the treatment plan.   Past Psychiatric History:Reviewed. H/O inpatient when try to jump off from the window and her husband saved her. Admitted at [facility] for 3 weeks. Seen in this office since 2013. Haldol helped.    Psychiatric Specialty Exam: Physical Exam  Review of Systems  Musculoskeletal:       Leg swelling and pain    There were no vitals taken for this visit.There is no height or weight on file to calculate BMI.  General Appearance: NA  Eye Contact:  NA  Speech:  fast  Volume:  Normal  Mood:  Dysphoric  Affect:  NA  Thought  Process:  Descriptions of Associations: Circumstantial  Orientation:  Full (Time, Place, and Person)  Thought Content:  Paranoid Ideation  Suicidal Thoughts:  No  Homicidal Thoughts:  No  Memory:  Immediate;   Fair Recent;   Fair Remote;   Fair  Judgement:  Fair  Insight:  Present  Psychomotor Activity:  NA  Concentration:  Concentration: Fair and Attention Span: Fair  Recall:  [employer] of Knowledge:  Fair  Language:  Fair  Akathisia:  No  Handed:  Right  AIMS (if indicated):     Assets:  [employer] Housing Social Support  ADL's:  Intact  Cognition:  Impaired,  Mild  Sleep:   fair      Assessment and Plan: Schizophrenia chronic paranoid type.  Generalized anxiety disorder.  Patient's daughter noticed decreasing Wellbutrin making her more sad and withdrawn.  She like to go back on Wellbutrin 300.  We will do that.  Reassurance given.  Continue haloperidol 0.5 mg to take at bedtime and second if needed for severe agitation.  We will start Wellbutrin XL 300 mg daily.  She is getting hydroxyzine from another provider.  Discussed medication side effects and benefits.  Recommended to call us back if she has any questions or any concerns.  Follow-up in 3 months.      Follow Up Instructions:    I discussed the assessment and treatment plan with the patient. The patient was provided an  opportunity to ask questions and all were answered. The patient agreed with the plan and demonstrated an understanding of the instructions.   The patient was advised to call back or seek an in-person evaluation if the symptoms worsen or if the condition fails to improve as anticipated.  I provided 20 minutes of non-face-to-face time during this encounter.   Kathlee Nations, MD

## 2020-03-09 ENCOUNTER — Other Ambulatory Visit (HOSPITAL_COMMUNITY): Payer: Self-pay | Admitting: *Deleted

## 2020-03-09 DIAGNOSIS — F411 Generalized anxiety disorder: Secondary | ICD-10-CM

## 2020-03-09 DIAGNOSIS — F200 Paranoid schizophrenia: Secondary | ICD-10-CM

## 2020-03-09 MED ORDER — BUPROPION HCL ER (XL) 300 MG PO TB24: 300.0000 mg | ORAL_TABLET | Freq: Every morning | ORAL | 0 refills | Status: DC

## 2020-04-01 ENCOUNTER — Other Ambulatory Visit (HOSPITAL_COMMUNITY): Payer: Self-pay | Admitting: Psychiatry

## 2020-04-01 DIAGNOSIS — F200 Paranoid schizophrenia: Secondary | ICD-10-CM

## 2020-04-12 ENCOUNTER — Other Ambulatory Visit: Payer: Self-pay

## 2020-04-12 ENCOUNTER — Telehealth: Payer: Self-pay | Admitting: Family Medicine

## 2020-04-12 ENCOUNTER — Ambulatory Visit (INDEPENDENT_AMBULATORY_CARE_PROVIDER_SITE_OTHER): Payer: Medicare Other | Admitting: Family Medicine

## 2020-04-12 ENCOUNTER — Encounter: Payer: Self-pay | Admitting: Family Medicine

## 2020-04-12 VITALS — BP 138/66 | HR 84 | Temp 97.6°F | Ht 63.25 in | Wt 266.4 lb

## 2020-04-12 DIAGNOSIS — R600 Localized edema: Secondary | ICD-10-CM | POA: Diagnosis not present

## 2020-04-12 DIAGNOSIS — I5032 Chronic diastolic (congestive) heart failure: Secondary | ICD-10-CM

## 2020-04-12 DIAGNOSIS — R5381 Other malaise: Secondary | ICD-10-CM | POA: Diagnosis not present

## 2020-04-12 NOTE — Progress Notes (Signed)
   [name] is a 79 y.o. female who presents today for an office visit.  Assessment/Plan:  Chronic Problems Addressed Today: Chronic diastolic heart failure (HCC) Stable heart and lung exam however has increased lower extremity edema.  She will continue taking Lasix for now.  Will place referral to [facility] for further assistance with management.  May need SNF placement eventually.  Debility Has not had much success with physical therapy.  Will place referral to teaching.  Lower extremity edema Continue furosemide 20 mg daily as needed.  She is up about 3 pounds since her last visit.     Subjective:  HPI:  Patient is here with her daughter today.  She has had ongoing difficulties with urinary incontinence and overall debility.  She has had increased lower extremity swelling as well.  She has been taking her medications as prescribed with no side effects.  Daughter is concerned about patient's ability to care for herself.  She is possibly interested in skilled nursing facility.  She has tried home health physical therapy and home health aide which have not been particularly successful per daughter.       Objective:  Physical Exam: BP 138/66   Pulse 84   Temp 97.6 F (36.4 C)   Ht 5' 3.25" (1.607 m)   Wt 266 lb 6.1 oz (120.8 kg)   SpO2 96%   BMI 46.81 kg/m   Wt Readings from Last 3 Encounters:  04/12/20 266 lb 6.1 oz (120.8 kg)  02/25/20 263 lb 3.2 oz (119.4 kg)  12/19/19 255 lb 3.2 oz (115.8 kg)  Gen: No acute distress, resting comfortably CV: Regular rate and rhythm with no murmurs appreciated Pulm: Normal work of breathing, clear to auscultation bilaterally with no crackles, wheezes, or rhonchi Psych: Normal affect and thought content      Shelby Divelbiss M. [name], MD 04/12/2020 11:40 AM

## 2020-04-12 NOTE — Telephone Encounter (Signed)
Patients daughter calling back to speak to [facility]

## 2020-04-12 NOTE — Assessment & Plan Note (Signed)
Has not had much success with physical therapy.  Will place referral to teaching.

## 2020-04-12 NOTE — Telephone Encounter (Signed)
Spoke with patient daughter has question about if she changes her mind about inpatient care will it be a issue.

## 2020-04-12 NOTE — Assessment & Plan Note (Signed)
Stable heart and lung exam however has increased lower extremity edema.  She will continue taking Lasix for now.  Will place referral to [facility] for further assistance with management.  May need SNF placement eventually.

## 2020-04-12 NOTE — Assessment & Plan Note (Signed)
Continue furosemide 20 mg daily as needed.  She is up about 3 pounds since her last visit.

## 2020-04-12 NOTE — Patient Instructions (Addendum)
It was very nice to see you today!  I will place a referral for you to meet with social worker.  No medication changes today.  I would like to see her back in about 3 months.  Take care, Dr [name]  Please try these tips to maintain a healthy lifestyle:   Eat at least 3 REAL meals and 1-2 snacks per day.  Aim for no more than 5 hours between eating.  If you eat breakfast, please do so within one hour of getting up.    Each meal should contain half fruits/vegetables, one quarter protein, and one quarter carbs (no bigger than a computer mouse)   Cut down on sweet beverages. This includes juice, soda, and sweet tea.     Drink at least 1 glass of water with each meal and aim for at least 8 glasses per day   Exercise at least 150 minutes every week.   Yes they are

## 2020-04-26 ENCOUNTER — Encounter: Payer: Self-pay | Admitting: *Deleted

## 2020-04-26 ENCOUNTER — Other Ambulatory Visit: Payer: Self-pay | Admitting: *Deleted

## 2020-04-26 NOTE — Patient Outreach (Signed)
[name] [facility]) Care Management  04/26/2020  [name] 08/07/1941 [id number]   Subjective: Telephone call to patient's home / mobile number, spoke with patient's daughter / designated party release [name]), stated patient's name, date of birth, and address.    Discussed [name] Medicare MD referral follow up, daughter voiced understanding, and is in agreement to follow up on patient's behalf.  Daughter states patient is doing well, unable to exercise due to Covid restrictions, feels this has lead to patient's bilateral lower extremity fluid retention, and states patient does not have congestive heart failure.  States the home health physical therapist had mentioned the congestive heart failure diagnosis also and patient is not on any treatment for congestive heart failure.  Daughter states she planning to discuss this diagnosis with patient's primary MD and seek clarification.   Daughter voices understanding of patient's medical diagnosis and treatment plan.  States the family administers patient's medications, have not been giving fluid pill as prescribed until recently due to patient being incontinence of urine.  States they are now aware of the importance of giving patient's medication as prescribed. Discussed the importance of daily monitoring of weight, monitoring of fluid retention, and how fluid can effect the heart and other organs, daughter voices understanding, and will follow up as needed. Daughter states patient is able to manage minimal self care and has assistance as needed.  States she is accessing patient's [employer] benefits on patient's behalf, as needed via member services number on back of card.  Daughter states she may be interested in pursuing skilled nursing home placement for short term rehab to assist with patient's fluid retention and increase patient's overall mobility, is in agreement to a referral to [place] Management Social Worker.  Daughter states patient  does not have any education material, or pharmacy needs at this time.  States she is very appreciative of the follow up, is in agreement  to receive 1 additional follow up call to assess for further CM needs, on patient's behalf,  and is in agreement to receive [place] Management Social Worker services.      Objective: Per KPN (Knowledge Performance Now, point of care tool) and chart review, patient has not had any recent hospitalizations or ED visits.   Patient also has a history of the following: hypertension, hyperlipidemia, Schizophrenia chronic paranoid type, Chronic diastolic heart failure , gout, and Generalized anxiety disorder.        Assessment: Received [employer] MD Referral on 04/12/2020.   Referral source: Dr. Dimas Chyle.  Referral reason: Reason for consult debility, Congestive Heart Failure,  Diagnoses of Heart Failure, Daughter interested in possible [place] placement.  Screening follow up completed and will follow up to assess further care management needs.  Will refer to [place] Management Social Worker for skilled nursing home placement.         Plan: RNCM will refer patient to [place] Management Social Worker for skilled nursing home placement.   RNCM will call patient's daugther for telephone outreach attempt, within 30  business days, EMMI follow up, to assess for further CM needs, and proceed with case closure, within 10 business days if no return call, after 4th unsuccessful outreach call.      [name] H. Annia Friendly, BSN, [place] Management [facility] Telephonic CM Phone: [phone] Fax: [phone]

## 2020-05-02 ENCOUNTER — Other Ambulatory Visit (HOSPITAL_COMMUNITY): Payer: Self-pay | Admitting: Psychiatry

## 2020-05-02 DIAGNOSIS — F200 Paranoid schizophrenia: Secondary | ICD-10-CM

## 2020-05-02 DIAGNOSIS — F411 Generalized anxiety disorder: Secondary | ICD-10-CM

## 2020-05-04 ENCOUNTER — Other Ambulatory Visit: Payer: Self-pay | Admitting: *Deleted

## 2020-05-04 NOTE — Patient Outreach (Signed)
[place] [facility]) Care Management  05/04/2020  [name] 07/23/1941 [id number]   CSW received referral on 04/27/2020 for ?SNF placement.  CSW made contact with pt's daughter, [name], who confirmed pt's identity.  CSW introduced self, role, reason for call.  Per daughter, her brother [name] lives with pt and is there with her day and night. Daughter goes over after work hours and spends time with pt.  "My brother goes to sleep when I get there because mom wakes up during the night".  Pt's son is there to supervise and assist pt; he does not work so he can provide this care.  It is working well (now) but daughter knows it is draining for him and that he may want to pursue his own home and getting a paid job at some point. Daughter reports her mom gets meals on wheels which has been helpful; as well as 8 hours a week of personal care t(the daughter believes this is through [place]).  This care has been helpful however is inconsistent per daughter.  She has decided she does not want to pursue placement of pt at this time; due to COVID restrictions, her dementia (worsening if moved out of her familiar setting), and overall concerns related to 14 day quaranting and other COVID restrictions and the uncertainty of insurance auth for SNF.  CSW  spent 1.5 hour discussing pt needs, options, resources as well as offering support/validation. Possible options to be considered include PACE, Adult Day Care, additional in home (private pay) personal care, PCS/CAPS, MOMS meals, food pantry, etc.  CSW plans to mail resources and email info to daughter and plan a follow up call in 10-14 days to discuss further.     [name], MSW, [place] Worker  [place] [phone]

## 2020-05-05 ENCOUNTER — Encounter: Payer: Self-pay | Admitting: *Deleted

## 2020-05-17 ENCOUNTER — Ambulatory Visit: Payer: Self-pay | Admitting: *Deleted

## 2020-05-17 ENCOUNTER — Other Ambulatory Visit: Payer: Self-pay | Admitting: *Deleted

## 2020-05-17 NOTE — Patient Outreach (Signed)
[facility]) Care Management  05/17/2020  CASSAUNDRA RASCH 1941-10-12 [id number]   CSW attempted to reach pt/family today for updates and was unable to speak with anyone.  CSW left a HIPPA compliant voice message and will attempt again in 3-4 business days if no return call is received.   [name], MSW, [place] Worker  [place] [phone]

## 2020-05-20 ENCOUNTER — Ambulatory Visit: Payer: Self-pay | Admitting: *Deleted

## 2020-05-20 ENCOUNTER — Other Ambulatory Visit: Payer: Self-pay | Admitting: *Deleted

## 2020-05-20 NOTE — Patient Outreach (Signed)
[facility]) Care Management  05/20/2020  AMARIANA MIRANDO 1941-06-14 [id number]   CSW attempted a 2nd follow up outreach call to pt/family and was unsuccessful..  CSW will send pt an Unsuccessful Outreach letter and attempt a third outreach call per policy if no return call is received.   [name], MSW, [place] Worker  [place] [phone]

## 2020-05-28 ENCOUNTER — Ambulatory Visit: Payer: Self-pay | Admitting: *Deleted

## 2020-05-28 ENCOUNTER — Other Ambulatory Visit: Payer: Self-pay | Admitting: Family Medicine

## 2020-05-28 ENCOUNTER — Other Ambulatory Visit: Payer: Self-pay | Admitting: *Deleted

## 2020-05-28 DIAGNOSIS — M545 Low back pain, unspecified: Secondary | ICD-10-CM

## 2020-05-28 NOTE — Telephone Encounter (Signed)
Pt requesting refills.

## 2020-05-28 NOTE — Patient Outreach (Signed)
[facility]) Care Management  05/28/2020  MARRIA MATHISON 1941-10-23 [id number]   CSW attempted to reach pt/family by phone again today and was unable to speak to anyone but did leave a voice mail  Message and will try again per policy in 30 days if no return call is received.   [name], MSW, [place] Worker  [place] [phone]

## 2020-05-31 ENCOUNTER — Other Ambulatory Visit: Payer: Self-pay | Admitting: *Deleted

## 2020-05-31 ENCOUNTER — Ambulatory Visit: Payer: Self-pay | Admitting: *Deleted

## 2020-05-31 NOTE — Patient Outreach (Addendum)
[place] [facility]) Care Management  05/31/2020  [name] May 30, 1941 [id number]   CSW received call from pt's daughter, [name], indicating she is wanting to reconsider SNF placement for mom.  "I think she might need to try that....Marland KitchenMarland Kitchen her Doctor thinks so too".   CSW talked with daughter about this plan; advised of process and will plan to work on this with PCP, Insurance and area SNF's as soon as possible.    Daughter is hopeful a short stay in SNF may help her legs and overall strength and well-being.    [name], MSW, [place] Worker  [place] [phone]

## 2020-06-01 ENCOUNTER — Encounter (HOSPITAL_COMMUNITY): Payer: Self-pay | Admitting: Psychiatry

## 2020-06-01 ENCOUNTER — Other Ambulatory Visit: Payer: Self-pay

## 2020-06-01 ENCOUNTER — Telehealth (INDEPENDENT_AMBULATORY_CARE_PROVIDER_SITE_OTHER): Payer: Medicare Other | Admitting: Psychiatry

## 2020-06-01 DIAGNOSIS — F411 Generalized anxiety disorder: Secondary | ICD-10-CM

## 2020-06-01 DIAGNOSIS — F200 Paranoid schizophrenia: Secondary | ICD-10-CM

## 2020-06-01 MED ORDER — HALOPERIDOL 0.5 MG PO TABS: ORAL_TABLET | ORAL | 0 refills | Status: DC

## 2020-06-01 MED ORDER — BUPROPION HCL ER (XL) 300 MG PO TB24: 300.0000 mg | ORAL_TABLET | Freq: Every morning | ORAL | 0 refills | Status: DC
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
## 2020-06-03 ENCOUNTER — Other Ambulatory Visit: Payer: Self-pay | Admitting: *Deleted

## 2020-06-04 NOTE — Patient Outreach (Signed)
[facility]) Care Management  06/04/2020  NAAMAH BOGGESS Mar 26, 1941 [id number]   CSW spoke with pt's daughter, [name], to update on process for seeking placement for pt.  At this time, CSW has emailed a blank FL2 to PCP for completion.  Once CSW receives FL2 back, placement search can begin.   Pt's daughter reminded this will all be dependent on insurance approval for level of care.   CSW will message PCP to inquire on FL2 and proceed with plans as above.   [name], MSW, [place] Worker  [place] [phone]

## 2020-06-09 ENCOUNTER — Ambulatory Visit: Payer: Self-pay | Admitting: *Deleted

## 2020-06-11 ENCOUNTER — Other Ambulatory Visit: Payer: Self-pay | Admitting: *Deleted

## 2020-06-11 NOTE — Patient Outreach (Signed)
[place] [facility]) Care Management  06/11/2020  [name] December 01, 1940 [id number]   CSW received email today from PCP indicating FL2 to be completed and faxed today.  CSW updated pt's daughter and will plan to proceed next week once FL2 has been delivered and finalized.   [name], MSW, [place] Worker  [place] [phone]

## 2020-06-16 ENCOUNTER — Other Ambulatory Visit: Payer: Self-pay | Admitting: *Deleted

## 2020-06-16 NOTE — Patient Outreach (Signed)
[facility]) Care Management  [place]  06/16/2020   [name] 11-Mar-1941 [id number]    Telephone screening  Received Transfer case from prior [place] Coordinator   Referral received : 04/12/20 Referral source: PCP office Referral reason : Debility, CHF, daughter interested in SNF Placement   Subjective:  Successful outreach call to patient daughter [name], Designated party release. Introduced self and previous follow up [facility] care management Care Coordinator and [name] LCSW we work together on same team.  [name] reports patient doing pretty good, she discussed her concern is with patient swelling of lower legs. Daughter states that she has heard from [place] in past regarding heart failure and discussed with PCP and states that she has a mild case.  Discussed self home management of heart failure, monitoring daily weights, fluid build up swelling. Daughter states they check patient weighs but not daily, they limit salt in diet and make sure she takes her medication as prescribed.  Daughter's main concern is patient with swelling in lower legs,she believes the fact that the patient sits in recliner/lift chair with feet down all the time is contributing to swelling. She states patient does not sleep in adjustable bed at night , she sleeps in recliner chair with feet down. She discussed that patient may have fear of lying in bed and that her dementia/paranoia may be contributing to that. She had recent visit with mental health.  Daughter discussed patient tolerates ambulates to bathroom using her rollator , she has 8 hours a week of personal care, patient son stays with patient 24 hours a day, and daughter assist every evening and daily as needed.  She discussed still wishing to pursue shortterm stay in rehab in hopes that patient will be able rest in bed with feet up and participate in therapy. She discussed patient having recent visit   She  reports that patient is able to use rollator in the home, walking to bathroom and out to screened in porch area. She discussed Education officer, museum is working with her regarding rehab.  Patient daughter declines need for additional educational  resource information at this time.   Assessment:  Daughter interested in possible short term skilled nursing placement . [facility] LCSW following .   Plan Will plan follow up call in the next 3 weeks to assess for ongoing Care management needs and for follow up . Will update [name] LCSW  of   telephone visit .   [name], RN, BSN  [place] Management Coordinator  [phone]- Mobile [phone]- Toll Free Main Office

## 2020-06-18 NOTE — Patient Outreach (Signed)
[facility]) Care Management  06/18/2020  [name] 07/31/1941 [id number]  CSW communicating with daughter and PCP for [facility] and SNF search to begin.  CSW was able to obtain the [facility] completed by PCP on yesterday.  CSW     [name], MSW, [place] Worker  [place] [phone]

## 2020-06-22 ENCOUNTER — Other Ambulatory Visit: Payer: Self-pay | Admitting: *Deleted

## 2020-06-22 DIAGNOSIS — Z09 Encounter for follow-up examination after completed treatment for conditions other than malignant neoplasm: Secondary | ICD-10-CM

## 2020-06-23 NOTE — Patient Outreach (Signed)
[place] [facility]) Care Management  06/23/2020  [name] 26-Aug-1941 [id number]   CSW spoke with pt's daughter on 06/22/2020 at length in regards to plans to proceed with SNF search.  Pt will require SNF auth from her primary Insurance provider in order to be covered at SNF by [facility].  Daughter is interested in short term SNF for rehab;  "she needs the physical therapy to get stronger".  CSW stressed to daughter this will not be a timely process given the delays with [facility] services (due to staffing and availability) as well as getting the insurance auth and bed confirmation.  CSW also reminded daughter that because of COVID, her mother may require a 2 week quarantine at SNF at time she is admitted.  CSW also discussed the PACE program again with daughter and provided her with the website to check out their offerings and opportunities for her mom which would be an alternative to SNF. CSW encouraged daughter to review and consider a tour if interested.    CSW will contact PCP office to request home health be arranged for the PT/OT and RN assessments for SNF determination/auth.     [name], MSW, [place] Worker  [place] [phone]

## 2020-06-25 ENCOUNTER — Other Ambulatory Visit: Payer: Self-pay | Admitting: *Deleted

## 2020-06-28 ENCOUNTER — Other Ambulatory Visit: Payer: Self-pay | Admitting: *Deleted

## 2020-06-28 ENCOUNTER — Ambulatory Visit: Payer: Self-pay | Admitting: *Deleted

## 2020-06-28 NOTE — Patient Outreach (Signed)
[facility]) Care Management  06/28/2020  [name] 03/31/1941 [id number]   Telephone screening  Received Transfer case from prior [place] 06/16/20   Referral received : 04/12/20 Referral source: PCP office Referral reason : Debility, CHF, daughter interested in SNF Placement    Subjective: Placed outreach call to patient daughter, [name], Designated party release.  [name] reports that her patient is doing okay. She reports patient continues to have swelling in lower legs, taking fluid pill daily, patient does weigh on a daily basis . She discussed patient with leg swelling for more than a year, they have tried to work on her elevating her legs. .  Patient daughter discussed patient having annual House Call visit from [place] health care and she has been provided education information on management of Heart Failure , she has declined additional education support on Heart failure. Encouraged to notify MD of worsening symptoms of swelling increased with gain, shortness of breath.  management. Daughter again asked my role in referral from MD, discussed  RN [facility] care management with help in maintaining patient health by sharing education information and providing support on managing chronic conditions  Reviewed we have social workers, pharmacist on team as well. Discussed LCSW [name] is was referred by prior CM for community resource support as daughter interest in short term rehab . Daughter voices understanding of support [name] is providing .  Briefly discussed community resource of PACE daughter is familiar with this and does not feel this will work for them or her mother at this time.  Discussed with daughter best way RN can assist with patient .  Patient daughter agreed that her  interest in having additional education material support on Dementia management.  She is agreeable to receiving education information .   Assessment:  Daughter  interested in possible short term skilled nursing placement . [facility] LCSW following . Patient daughter  interested  in education material on managing Dementia .    Plan Will plan follow up call in the next 3 weeks  to assess for ongoing Care management needs,complete assessment if agreeable , for follow up, . Will update Shelby Clos LCSW  of   telephone visit .  [name], RN, BSN  [place] Management Coordinator  [phone]- Mobile [phone]- Toll Free Main Office

## 2020-06-30 NOTE — Addendum Note (Signed)
Addended by: Betti Cruz on: 06/30/2020 03:02 PM   Modules accepted: Orders

## 2020-07-01 ENCOUNTER — Other Ambulatory Visit: Payer: Self-pay | Admitting: *Deleted

## 2020-07-01 NOTE — Patient Outreach (Signed)
[place] [facility]) Care Management  07/01/2020  [name] 1940/11/18 [id number]   CSW received return call from daughter, [name], who reports she has received no alerts or call from a home health agency as previously requested.  CSW contacted PCP who has his office team looking into status of.  CSW updated daughter and advised her to expect a call from a home health agency to begin services.  CSW will plan to follow up regarding the status of the hh referral and services next week. CSW stressed to daughter there likely will be a delay in getting the disciplines to the home because of demand and staffing issues.     [name], MSW, [place] Worker  [place] [phone]

## 2020-07-02 NOTE — Progress Notes (Signed)
Referral was send

## 2020-07-05 ENCOUNTER — Other Ambulatory Visit (HOSPITAL_COMMUNITY): Payer: Self-pay | Admitting: Psychiatry

## 2020-07-05 DIAGNOSIS — F200 Paranoid schizophrenia: Secondary | ICD-10-CM

## 2020-07-06 ENCOUNTER — Other Ambulatory Visit: Payer: Self-pay | Admitting: *Deleted

## 2020-07-07 ENCOUNTER — Telehealth: Payer: Self-pay | Admitting: Family Medicine

## 2020-07-07 ENCOUNTER — Other Ambulatory Visit: Payer: Self-pay | Admitting: *Deleted

## 2020-07-07 NOTE — Patient Outreach (Signed)
[place] [facility]) Care Management  07/07/2020  [name] [birth year] [id number]   CSW spoke with pt's daughter by phone on 07/06/2020 who reports she has not heard from any home health providers.  Pt's daughter states she plans to call PCP office to inquire.  CSW also reminded her to check her mother's contact # (at pt's home) for possible message left.   CSW will touch base again in the next 10 days.   [name], MSW, [place] Worker  [place] [phone]

## 2020-07-07 NOTE — Telephone Encounter (Signed)
Patients daughter  Is calling regarding the order for PT the patient is needing and would like a call back to discuss what she needs to do next [phone]

## 2020-07-09 ENCOUNTER — Ambulatory Visit: Payer: Medicare Other | Admitting: *Deleted

## 2020-07-13 ENCOUNTER — Telehealth: Payer: Self-pay | Admitting: Family Medicine

## 2020-07-13 ENCOUNTER — Other Ambulatory Visit: Payer: Self-pay | Admitting: *Deleted

## 2020-07-13 NOTE — Telephone Encounter (Signed)
Referral was sent to [facility] on 07/07/2020 for pt to get seen by agency. Did not hear from [facility]. Sent message on 07/13/2020 and was told the patient did not have a visit within the appropriate time frame (90 days). Last visit patient was seen was on 04/12/2020. Will schedule appointment for pt to see Dr. [name] for [place] referral.

## 2020-07-13 NOTE — Patient Outreach (Signed)
[place] [facility]) Care Management  07/13/2020  [name] [birth year] [id number]   CSW received message back from PCP indicating the referral to [facility] was placed earlier this month, however, it was not accepted by the agency.  This was unknown to PCP and his office staff and thus they are working to refer it (ASAP) to an agency that can staff it.  CSW contacted pt's daughter to advise of the delay and hopeful soon outreach call. CSW will touch base later this week for updates on the status of this.   [name], MSW, [place] Worker  [place] [phone]

## 2020-07-13 NOTE — Patient Outreach (Signed)
[facility]) Care Management  07/13/2020  [name] 04/19/1941 [id number]   CSW spoke with pt's daughter, [name], who says her mother is still at home and doing fair.  She has not received word from any home health agency nor the PCP office.  CSW advised daughter of plans to reach out to PCP's office (email sent to PCP and will route this note to PCP) for inquiry and to request the [facility] PT, OT, RN, bath aide and SW be ordered/aranged.   Pt's daughter also plans to call PCP office.  CSW will update once any word is received on the above.   [name], MSW, [place] Worker  [place] [phone]

## 2020-07-15 ENCOUNTER — Other Ambulatory Visit: Payer: Self-pay | Admitting: *Deleted

## 2020-07-15 NOTE — Patient Outreach (Signed)
[place] [facility]) Care Management  07/15/2020  GRAE LEATHERS 1940/12/10 [id number]   CSW spoke with pt's daughter who reports, "Dr [name] called and he has to see her in person before the home health can be arranged because its been more than 90 days. Pt will be seen by PCP on Monday.  CSW will touch base with daughter after the PCP visit for further updates.   [name], MSW, [place] Worker  [place] [phone]

## 2020-07-19 ENCOUNTER — Telehealth (INDEPENDENT_AMBULATORY_CARE_PROVIDER_SITE_OTHER): Payer: Medicare Other | Admitting: Family Medicine

## 2020-07-19 VITALS — Ht 63.5 in

## 2020-07-19 DIAGNOSIS — I5032 Chronic diastolic (congestive) heart failure: Secondary | ICD-10-CM | POA: Diagnosis not present

## 2020-07-19 DIAGNOSIS — I878 Other specified disorders of veins: Secondary | ICD-10-CM

## 2020-07-19 DIAGNOSIS — R5381 Other malaise: Secondary | ICD-10-CM | POA: Diagnosis not present

## 2020-07-19 NOTE — Assessment & Plan Note (Signed)
Will place referral to home health PT.  

## 2020-07-19 NOTE — Assessment & Plan Note (Addendum)
Will place referral to home health PT. Continue lasix 20mg  daily as needed.

## 2020-07-19 NOTE — Progress Notes (Signed)
   [name] is a 79 y.o. female who presents today for a virtual office visit.  Assessment/Plan:  Chronic Problems Addressed Today: Chronic diastolic heart failure (HCC) Overall stable. Will place referral to home health.   Debility Will place referral to home health PT.  Venous stasis of lower extremity Will place referral to home health PT. Continue lasix 20mg  daily as needed.      Subjective:  HPI:  See A/p for status of chronic conditions. Patient here today with her daughter. She has had progressively worsening debility and lower extremity edema. Is been very difficult for daughter to manage her mother's condition at home. We had to place a referral to home health about 3 months ago but unfortunately this referral was not completed before the 90-day window expired. They're still interested in pursuing home health evaluation and management at this point.       Objective/Observations  Physical Exam: Gen: NAD, resting comfortably Pulm: Normal work of breathing Neuro: Grossly normal, moves all extremities  Virtual Visit via Video   I connected with [name] on 07/19/20 at  3:40 PM EDT by a video enabled telemedicine application and verified that I am speaking with the correct person using two identifiers. The limitations of evaluation and management by telemedicine and the availability of in person appointments were discussed. The patient expressed understanding and agreed to proceed.   Patient location: Home Provider location: [place] participating in the virtual visit: Myself and Patient     Algis Greenhouse. [name], MD 07/19/2020 4:01 PM

## 2020-07-19 NOTE — Assessment & Plan Note (Signed)
Overall stable. Will place referral to home health.

## 2020-07-20 ENCOUNTER — Other Ambulatory Visit: Payer: Self-pay | Admitting: *Deleted

## 2020-07-20 ENCOUNTER — Ambulatory Visit: Payer: Self-pay | Admitting: *Deleted

## 2020-07-20 NOTE — Patient Outreach (Signed)
[facility]) Care Management  07/20/2020  INGER WIEST February 16, 1941 [id number]   CSW attempted to reach pt's daughter today for an update and was unable to reach.  CSW left a HIPPA compliant voice message and will await callback or try again in 3-4 business days.   [name], MSW, [place] Worker  [place] [phone]

## 2020-07-26 ENCOUNTER — Ambulatory Visit: Payer: Self-pay | Admitting: *Deleted

## 2020-07-29 ENCOUNTER — Other Ambulatory Visit: Payer: Self-pay | Admitting: Family Medicine

## 2020-07-29 MED ORDER — LOSARTAN POTASSIUM 100 MG PO TABS: 100.0000 mg | ORAL_TABLET | Freq: Every day | ORAL | 3 refills | Status: DC

## 2020-07-29 NOTE — Addendum Note (Signed)
Addended by: Marian Sorrow on: 07/29/2020 10:22 AM   Modules accepted: Orders

## 2020-07-30 ENCOUNTER — Other Ambulatory Visit: Payer: Self-pay | Admitting: *Deleted

## 2020-07-30 NOTE — Patient Outreach (Signed)
[facility]) Care Management  07/30/2020  [name] 1941-10-02 [id number]   CSW spoke with pt's daughter who reports they have not heard from a  home health agency yet. Per daughter, after the PCP office visit in September the home health was supposed to be arranged.  CSW will contact PCP office to alert them to this.   [name], MSW, [place] Worker  [place] [phone]

## 2020-08-02 ENCOUNTER — Other Ambulatory Visit: Payer: Self-pay | Admitting: *Deleted

## 2020-08-03 NOTE — Patient Outreach (Signed)
[facility]) Care Management  08/03/2020  CAELA HUOT [birth year] [id number]   CSW made contact with PCP office and was able to determine [facility] agency referral was made 9/20 to [facility].  CSW was also able to make contact with [facility] agency who indicates they are not able to staff it. PCP office aware and has now made referral to [place] agency.   CSW left voice message for pt's daughter to advise on above.   [name], MSW, [place] Worker  [place] [phone]

## 2020-08-04 ENCOUNTER — Other Ambulatory Visit: Payer: Self-pay | Admitting: Family Medicine

## 2020-08-04 DIAGNOSIS — F200 Paranoid schizophrenia: Secondary | ICD-10-CM

## 2020-08-05 ENCOUNTER — Other Ambulatory Visit: Payer: Self-pay | Admitting: *Deleted

## 2020-08-05 NOTE — Patient Outreach (Signed)
[facility]) Care Management  08/05/2020  [name] 27-Apr-1941 [id number]   CSW received a call from [facility] Intake rep who states they have been trying to reach pt/family for a few days in regards to the home health referral placed by PCP.  CSW provided # for daughter which is the # they have too.  CSW offered to call daughter and has subsequently done so and left a message for call back to CSW or [facility] agency asap.   [name], MSW, [place] Worker  [place] [phone]

## 2020-08-06 ENCOUNTER — Telehealth: Payer: Self-pay

## 2020-08-06 ENCOUNTER — Ambulatory Visit: Payer: Self-pay | Admitting: *Deleted

## 2020-08-06 NOTE — Telephone Encounter (Signed)
[facility] has tried to reach patient through phone and driving by location to get her scheduled for home health care. Nurse who drove by today stated it does not appear anyone is living at current address. They have not been able to successfully get in contact with patient to start services.

## 2020-08-07 NOTE — Patient Outreach (Signed)
[facility]) Care Management  08/07/2020  [name] 1941/06/02 [id number]   CSW has continued throughout this week to reach pt/family to update and inquire about the home health arrangements and have been unsuccessful.  CSW has left multiple messages for pt's daughter (only contact # provided) with no return calls.  CSW received a call from [facility] health intake rep indicating they would have one of their  [facility] workers go by pt's home address to attempt contact.  CSW will continue to seek contact and advise.   [name], MSW, [place] Worker  [place] [phone]

## 2020-08-08 ENCOUNTER — Encounter: Payer: Self-pay | Admitting: Family Medicine

## 2020-08-09 ENCOUNTER — Ambulatory Visit: Payer: Self-pay | Admitting: *Deleted

## 2020-08-09 ENCOUNTER — Other Ambulatory Visit (HOSPITAL_COMMUNITY): Payer: Self-pay | Admitting: Psychiatry

## 2020-08-09 ENCOUNTER — Telehealth: Payer: Self-pay

## 2020-08-09 DIAGNOSIS — F411 Generalized anxiety disorder: Secondary | ICD-10-CM

## 2020-08-09 DIAGNOSIS — F200 Paranoid schizophrenia: Secondary | ICD-10-CM

## 2020-08-09 NOTE — Telephone Encounter (Signed)
FYI

## 2020-08-09 NOTE — Telephone Encounter (Signed)
[place] is calling in stating a referral was sent over for home health, they have attempted to contact patient multiple times and tried to drive by but the home looked to be abandoned, had another nurse reach out to patient but was not successful. [place] is asking how Dr.Pakrer would like to proceed.

## 2020-08-09 NOTE — Telephone Encounter (Signed)
See below

## 2020-08-10 ENCOUNTER — Other Ambulatory Visit: Payer: Self-pay | Admitting: *Deleted

## 2020-08-10 ENCOUNTER — Encounter: Payer: Self-pay | Admitting: Family Medicine

## 2020-08-10 NOTE — Telephone Encounter (Signed)
Received message from patients daughter that they no longer are interested in home health.  Algis Greenhouse. [name], MD 08/10/2020 8:45 AM

## 2020-08-10 NOTE — Patient Outreach (Signed)
[facility]) Care Management  [name]  08/10/2020   [name] May 27, 1941 [id number]  Telephone screening  Received Transfer case from prior [place] 06/16/20   Referral received : 04/12/20 Referral source: PCP office Referral reason: Debility, CHF, daughter interested in SNF Placement .   Subjective: Unsuccessful outreach call to patient daughter [name], designated party release, able to leave a voicemail message for return call.   Plan Will await return call if no response will plan return call in the next 4 business days, will update [name] , LCSW of unsuccessful outreach and follow up plan.    [name], RN, BSN  [place] Management Coordinator  [phone]- Mobile [phone]- Toll Free Main Office

## 2020-08-10 NOTE — Patient Outreach (Addendum)
[facility]) Care Management  08/10/2020  [name] 1941/07/07 [id number]  CSW continues to try to reach pt/family and has been unable.  CSW has left voice messages for pt's daughter and will send Unsuccessful outreach letter as well. CSW will attempt a 3rd outreach call attempt per policy. CSW will update [facility] and PCP of above.   [name], MSW, LCSW Clinical Social Worker  [place]  [phone]    ADDENDUM:  CSW has reviewed chart notes from PCP office that indicate daughter has decided against Home Health as well as placement.  CSW will update [facility] RNCM.   [name], MSW, [place] Worker  [place] [phone]

## 2020-08-13 ENCOUNTER — Other Ambulatory Visit: Payer: Self-pay | Admitting: *Deleted

## 2020-08-13 NOTE — Patient Outreach (Signed)
[facility]) Care Management  [place]  08/13/2020   [name] 01/05/1941 [id number]   Telephone screening  Received Transfer case from prior [facility] Care Coordinator8/18/21   Referral received : 04/12/20 Referral source: PCP office Referral reason: Debility, CHF, daughter interested in SNF Placement.   Subjective: Unsuccessful outreach call to patient daughter [name], designated party release, able to leave a voicemail message for return call.   Per note in epic to provider, patient daughter is  no longer interested in home health or SNF placement at this time   Plan:  Will plan return call in the next 4 business days.  Will send unsuccessful outreach letter.    [name], RN, BSN  [place] Management Coordinator  [phone]- Mobile [phone]- Toll Free Main Office

## 2020-08-18 ENCOUNTER — Other Ambulatory Visit: Payer: Self-pay | Admitting: *Deleted

## 2020-08-18 NOTE — Patient Outreach (Addendum)
[name]) Care Management  [place]  08/18/2020   [name] 1941-10-27 [id number]    Telephone screening  Received Transfer case from prior [facility] Care Coordinator8/18/21   Referral received : 04/12/20 Referral source: PCP office Referral reason: Debility, CHF, daughter interested in SNF Placement.   Subjective: Unsuccessful outreach call to patient daughter [name], designated party release, able to leave a voicemail message for return call.   Per note in epic to provider, patient daughter is  no longer interested in home health or SNF placement at this time   Plan:  Will plan return call in the next 4 weeks , will communicate with LCSW [name].   [name], RN, BSN  [place] Management Coordinator  [phone]- Mobile [phone]- Toll Free Main Office .

## 2020-08-19 ENCOUNTER — Ambulatory Visit: Payer: Self-pay | Admitting: *Deleted

## 2020-08-20 ENCOUNTER — Other Ambulatory Visit: Payer: Self-pay | Admitting: *Deleted

## 2020-08-20 NOTE — Patient Outreach (Signed)
[place] [facility]) Care Management  08/20/2020  MYRIA STEENBERGEN February 10, 1941 [id number]   CSW attempted to reach pt's daughter on 08/19/2020 (pt's birthday) and was unable to reach.  CSW left a HIPPA compliant voice message and will attempt again per policy in 30 days if no return call is received.   [name], MSW, [place] Worker  [place] [phone]

## 2020-08-30 ENCOUNTER — Encounter: Payer: Self-pay | Admitting: Family Medicine

## 2020-08-30 ENCOUNTER — Other Ambulatory Visit: Payer: Self-pay

## 2020-08-30 ENCOUNTER — Ambulatory Visit (INDEPENDENT_AMBULATORY_CARE_PROVIDER_SITE_OTHER): Payer: Medicare Other | Admitting: Family Medicine

## 2020-08-30 VITALS — BP 123/68 | HR 85 | Temp 98.5°F | Ht 63.5 in | Wt 283.4 lb

## 2020-08-30 DIAGNOSIS — I878 Other specified disorders of veins: Secondary | ICD-10-CM | POA: Diagnosis not present

## 2020-08-30 DIAGNOSIS — I10 Essential (primary) hypertension: Secondary | ICD-10-CM | POA: Diagnosis not present

## 2020-08-30 DIAGNOSIS — Z23 Encounter for immunization: Secondary | ICD-10-CM | POA: Diagnosis not present

## 2020-08-30 DIAGNOSIS — E785 Hyperlipidemia, unspecified: Secondary | ICD-10-CM

## 2020-08-30 DIAGNOSIS — I5032 Chronic diastolic (congestive) heart failure: Secondary | ICD-10-CM

## 2020-08-30 DIAGNOSIS — R350 Frequency of micturition: Secondary | ICD-10-CM

## 2020-08-30 NOTE — Assessment & Plan Note (Signed)
We will increase Lasix to 20 mg twice daily.  Discussed importance of leg elevation and salt avoidance.  Will place referral to cardiology.  Check CBC, CMET, TSH.

## 2020-08-30 NOTE — Progress Notes (Signed)
   [name] is a 79 y.o. female who presents today for an office visit.  Assessment/Plan:  Chronic Problems Addressed Today: Chronic diastolic heart failure (HCC) We will increase Lasix to 20 mg twice daily.  Discussed importance of leg elevation and salt avoidance.  Will place referral to cardiology.  Check CBC, CMET, TSH.  Essential hypertension At goal.  Continue losartan 100 mg daily and amlodipine 10 mg daily.  Venous stasis of lower extremity No red flags.  Will increase Lasix as above.  May consider stopping amlodipine as this could be contributing.  Hyperlipidemia Continue Lipitor 40 mg daily.  Check lipid panel.  Urinary Frequency Likely due to Lasix.  Will check urine culture.    Subjective:  HPI:  See A/p.         Objective:  Physical Exam: BP 123/68   Pulse 85   Temp 98.5 F (36.9 C) (Temporal)   Ht 5' 3.5" (1.613 m)   Wt 283 lb 6.4 oz (128.5 kg)   SpO2 99%   BMI 49.41 kg/m   Wt Readings from Last 3 Encounters:  08/30/20 283 lb 6.4 oz (128.5 kg)  04/12/20 266 lb 6.1 oz (120.8 kg)  02/25/20 263 lb 3.2 oz (119.4 kg)  Gen: No acute distress, resting comfortably CV: Regular rate and rhythm with no murmurs appreciated Pulm: Normal work of breathing, clear to auscultation bilaterally with no crackles, wheezes, or rhonchi MSK: 3+ pitting edema to legs bilaterally. Neuro: Grossly normal, moves all extremities Psych: Normal affect and thought content      Amily Depp M. [name], MD 08/30/2020 3:48 PM

## 2020-08-30 NOTE — Assessment & Plan Note (Signed)
No red flags.  Will increase Lasix as above.  May consider stopping amlodipine as this could be contributing.

## 2020-08-30 NOTE — Patient Instructions (Signed)
It was very nice to see you today!  Please increase the Lasix to 20 mg twice daily.  We will check blood work and urine sample today.  We will give your flu vaccine today.  Please come back in 6 months.  Please come back to see me sooner if needed.  Take care, Dr [name]  Please try these tips to maintain a healthy lifestyle:   Eat at least 3 REAL meals and 1-2 snacks per day.  Aim for no more than 5 hours between eating.  If you eat breakfast, please do so within one hour of getting up.    Each meal should contain half fruits/vegetables, one quarter protein, and one quarter carbs (no bigger than a computer mouse)   Cut down on sweet beverages. This includes juice, soda, and sweet tea.     Drink at least 1 glass of water with each meal and aim for at least 8 glasses per day   Exercise at least 150 minutes every week.

## 2020-08-30 NOTE — Assessment & Plan Note (Signed)
At goal.  Continue losartan 100 mg daily and amlodipine 10 mg daily.

## 2020-08-30 NOTE — Assessment & Plan Note (Signed)
Continue Lipitor 40 mg daily.  Check lipid panel. 

## 2020-08-31 ENCOUNTER — Telehealth (INDEPENDENT_AMBULATORY_CARE_PROVIDER_SITE_OTHER): Payer: Medicare Other | Admitting: Psychiatry

## 2020-08-31 ENCOUNTER — Other Ambulatory Visit: Payer: Self-pay

## 2020-08-31 ENCOUNTER — Other Ambulatory Visit: Payer: Self-pay | Admitting: *Deleted

## 2020-08-31 ENCOUNTER — Encounter (HOSPITAL_COMMUNITY): Payer: Self-pay | Admitting: Psychiatry

## 2020-08-31 VITALS — Wt 283.0 lb

## 2020-08-31 DIAGNOSIS — F200 Paranoid schizophrenia: Secondary | ICD-10-CM

## 2020-08-31 DIAGNOSIS — F419 Anxiety disorder, unspecified: Secondary | ICD-10-CM

## 2020-08-31 DIAGNOSIS — R899 Unspecified abnormal finding in specimens from other organs, systems and tissues: Secondary | ICD-10-CM

## 2020-08-31 LAB — URINE CULTURE
MICRO NUMBER:: 11143163
SPECIMEN QUALITY:: ADEQUATE

## 2020-08-31 LAB — COMPREHENSIVE METABOLIC PANEL
AG Ratio: 1.4 (calc) (ref 1.0–2.5)
ALT: 23 U/L (ref 6–29)
AST: 22 U/L (ref 10–35)
Albumin: 4.3 g/dL (ref 3.6–5.1)
Alkaline phosphatase (APISO): 108 U/L (ref 37–153)
BUN/Creatinine Ratio: 13 (calc) (ref 6–22)
BUN: 20 mg/dL (ref 7–25)
CO2: 23 mmol/L (ref 20–32)
Calcium: 9.9 mg/dL (ref 8.6–10.4)
Chloride: 106 mmol/L (ref 98–110)
Creat: 1.51 mg/dL — ABNORMAL HIGH (ref 0.60–0.93)
Globulin: 3 g/dL (calc) (ref 1.9–3.7)
Glucose, Bld: 105 mg/dL — ABNORMAL HIGH (ref 65–99)
Potassium: 4.4 mmol/L (ref 3.5–5.3)
Sodium: 143 mmol/L (ref 135–146)
Total Bilirubin: 0.4 mg/dL (ref 0.2–1.2)
Total Protein: 7.3 g/dL (ref 6.1–8.1)

## 2020-08-31 LAB — URINALYSIS, ROUTINE W REFLEX MICROSCOPIC
Bacteria, UA: NONE SEEN /HPF
Bilirubin Urine: NEGATIVE
Glucose, UA: NEGATIVE
Hgb urine dipstick: NEGATIVE
Hyaline Cast: NONE SEEN /LPF
Ketones, ur: NEGATIVE
Leukocytes,Ua: NEGATIVE
Nitrite: NEGATIVE
RBC / HPF: NONE SEEN /HPF (ref 0–2)
Specific Gravity, Urine: 1.014 (ref 1.001–1.03)
WBC, UA: NONE SEEN /HPF (ref 0–5)
pH: 5.5 (ref 5.0–8.0)

## 2020-08-31 LAB — LIPID PANEL
Cholesterol: 212 mg/dL — ABNORMAL HIGH (ref <200)
HDL: 49 mg/dL — ABNORMAL LOW (ref >=50)
LDL Cholesterol (Calc): 137 mg/dL (calc) — ABNORMAL HIGH
Non-HDL Cholesterol (Calc): 163 mg/dL (calc) — ABNORMAL HIGH (ref <130)
Total CHOL/HDL Ratio: 4.3 (calc) (ref <5.0)
Triglycerides: 136 mg/dL (ref <150)

## 2020-08-31 LAB — HEMOGLOBIN A1C
Hgb A1c MFr Bld: 6.4 % of total Hgb — ABNORMAL HIGH (ref <5.7)
Mean Plasma Glucose: 137 (calc)
eAG (mmol/L): 7.6 (calc)

## 2020-08-31 LAB — CBC
HCT: 36.9 % (ref 35.0–45.0)
Hemoglobin: 12.2 g/dL (ref 11.7–15.5)
MCH: 30.8 pg (ref 27.0–33.0)
MCHC: 33.1 g/dL (ref 32.0–36.0)
MCV: 93.2 fL (ref 80.0–100.0)
MPV: 12.5 fL (ref 7.5–12.5)
Platelets: 260 10*3/uL (ref 140–400)
RBC: 3.96 10*6/uL (ref 3.80–5.10)
RDW: 15.2 % — ABNORMAL HIGH (ref 11.0–15.0)
WBC: 8.7 10*3/uL (ref 3.8–10.8)

## 2020-08-31 LAB — TSH: TSH: 4.62 mIU/L — ABNORMAL HIGH (ref 0.40–4.50)

## 2020-08-31 MED ORDER — BUPROPION HCL ER (XL) 300 MG PO TB24: 300.0000 mg | ORAL_TABLET | Freq: Every morning | ORAL | 0 refills | Status: DC

## 2020-08-31 MED ORDER — HALOPERIDOL 0.5 MG PO TABS: ORAL_TABLET | ORAL | 0 refills | Status: DC

## 2020-08-31 NOTE — Progress Notes (Signed)
Please inform patient of the following:  Kidney numbers are up a bit. Everything else is stable. Would like for her to come back in 1-2 weeks to recheck BMET but do not need to make any other changes to her treatment plan at this time.  Algis Greenhouse. [name], MD 08/31/2020 2:44 PM
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
## 2020-09-01 ENCOUNTER — Telehealth: Payer: Self-pay | Admitting: Family Medicine

## 2020-09-01 NOTE — Telephone Encounter (Signed)
Left message for patient to call back and schedule Medicare Annual Wellness Visit (AWV) either virtually OR in office.   Last AWV 09/01/19; please schedule at anytime with [facility]-Nurse Health Advisor at [facility]  This should be a 45 minute visit.

## 2020-09-13 ENCOUNTER — Other Ambulatory Visit: Payer: Medicare Other

## 2020-09-13 DIAGNOSIS — R899 Unspecified abnormal finding in specimens from other organs, systems and tissues: Secondary | ICD-10-CM

## 2020-09-14 LAB — BASIC METABOLIC PANEL
BUN/Creatinine Ratio: 11 (calc) (ref 6–22)
BUN: 15 mg/dL (ref 7–25)
CO2: 22 mmol/L (ref 20–32)
Calcium: 9.7 mg/dL (ref 8.6–10.4)
Chloride: 106 mmol/L (ref 98–110)
Creat: 1.39 mg/dL — ABNORMAL HIGH (ref 0.60–0.93)
Glucose, Bld: 101 mg/dL — ABNORMAL HIGH (ref 65–99)
Potassium: 4.4 mmol/L (ref 3.5–5.3)
Sodium: 142 mmol/L (ref 135–146)

## 2020-09-14 NOTE — Progress Notes (Signed)
Please inform patient of the following:  Kidney function improving. Would like for her to continue staying well hydrated and we can recheck next time she comes in.  [name]. [name], MD 09/14/2020 8:03 AM

## 2020-09-16 ENCOUNTER — Other Ambulatory Visit: Payer: Self-pay | Admitting: *Deleted

## 2020-09-16 DIAGNOSIS — R899 Unspecified abnormal finding in specimens from other organs, systems and tissues: Secondary | ICD-10-CM

## 2020-09-16 NOTE — Patient Outreach (Signed)
[facility]) Care Management  09/16/2020  [name] 04/15/1941 [id number]   Telephone screening  Received Transfer case from prior [facility] Care Coordinator8/18/21   Referral received : 04/12/20 Referral source: PCP office Referral reason: Debility, CHF, daughter interested in SNF Placement.   PMHX: significant for Diastolic heart failure, Hypertension , Venous stasis of lower extremities , paranoid schizophrenia.     Subjective: Unsuccessful outreach call to patient daughter [name], designated party release, able to leave a voicemail message for return call.   Unsuccessful outreach calls x 4, patient listed as [facility] active, need established care plan and complete assessment .   Plan: Unsuccessful outreach calls x 4, no response from outreach letter,  to patient listed as [facility] active. Will place patient in disease management program and plan return outreach in the next 3 months per workflow.    [name], RN, BSN  [place] Management Coordinator  [phone]- Mobile [phone]- Toll Free Main Office

## 2020-09-17 ENCOUNTER — Ambulatory Visit: Payer: Self-pay | Admitting: *Deleted

## 2020-09-20 NOTE — Progress Notes (Signed)
Cardiology Office Note:    Date:  09/22/2020   ID:  SIMON AABERG, DOB 1941-02-15, MRN [id number]  PCP:  Vivi Barrack, MD  [place] Cardiologist:  No primary care provider on file.  [facility] Electrophysiologist:  None   Referring MD: Vivi Barrack, MD    History of Present Illness:    Shelby ROCCHIO is a 79 y.o. female with a hx of dementia, anxiety, ?schizophrenia, TIA, HTN, HLD, chronic venous insufficiency, and history of ovarian cancer s/p oophrectomy who was referred by Dr. [name] for further evaluation of chronic diastolic heart failure.  History obtained mainly by the daughter who accompanied the patient to the appointment. Patient has a history of CKD with chronic LE edema for which she has been on lasix. Over the past several months, her weight has increased from 230lbs to 280lbs. Her lasix has been increased without significant improvement. During a visit earlier in [place], her Cr had bumped and therefore her lasix was held with improvement. The patient has also had very bad dyspnea on exertion such that she is unable to ambulate more than a couple feet without needing to stop to catch her breath. She has a history of tobacco use but no known diagnosis of COPD. Not on home oxygen. She has never had an ischemic work-up that she knows of. Per the daughter, she has been complaining of intermittent chest pressure but main complaint has been shortness of breath.  On arrival here, the patient was notably very dyspneic with active wheezing. Oxygen saturations were in the 80's. She was placed on 3L Uncertain with improvement of sats to the mid-90s. Exam notable for 2+ pitting edema to the thighs with diffuse expiratory wheezing and rhonchi on exam. Given hypoxia, respiratory distress, and evidence of significant volume overload, the decision was made to transfer to the hospital for further management.  Past Medical History:  Diagnosis Date  . Chronic venous insufficiency     . Colon, diverticulosis Sept. 2011   outpatient colonoscopy by Dr. Cristina Gong.  Need record  . DDD (degenerative disc disease), lumbosacral   . Depression   . DJD (degenerative joint disease), multiple sites    Low back pain worst  . GERD (gastroesophageal reflux disease)   . Gout   . Gout    Uric Acid level 4.2 on 300 allopurinol  . History of cervical cancer 1972  . Hx-TIA (transient ischemic attack) 05/13/2001   Right facial numbness  . Hx-TIA (transient ischemic attack) 05/13/2001   Looking back in Epic, the patient had a hospitalization in July 2002 for evaluation of questionable TIA (s/s of right facial numbness associated with mild blurred vision in her right eye, diaphoresis, and mild confusion) with negative CT head and negative MRI/MRA and was started on Plavix after being heparinized. She was to have possible follow up with Neurology for reevaluation of the need for Pl  . Hyperlipidemia   . Hypertension   . Keloid 08/01/2018  . Memory disorder 12/20/2016  . Mitral valve prolapse 10/27/1999   Subsequent 2D echo show normal mitral valve  . Osteoarthritis of hip    bilateral hips  . Ovarian cancer ([place]) 1972   S/P oophorectomy  . Psychosis ([place])   . Recurrent boils    History of MRSA skin infections with abscess  . Sensorineural hearing loss of both ears   . Subdural hematoma ([place]) 01/26/2017   bilateral  . Uncomplicated opioid dependence ([place]) 01/31/2018    Past Surgical History:  Procedure Laterality Date  . ABDOMINAL HYSTERECTOMY     1972  . BURR HOLE Bilateral 02/12/2017   Procedure: Haskell Flirt;  Surgeon: Earnie Larsson, MD;  Location: [facility];  Service: Neurosurgery;  Laterality: Bilateral;  . CERVICAL DISCECTOMY  7/07   C5-C6  . JOINT REPLACEMENT     bilateral hip replacement  . LUMBAR DISC SURGERY     L5-S1 7/07  . LUMBAR LAMINECTOMY/DECOMPRESSION MICRODISCECTOMY N/A 02/02/2016   Procedure: L4-5 Decompression;  Surgeon: Marybelle Killings, MD;  Location: [facility];  Service:  Orthopedics;  Laterality: N/A;  . [place] for ovarian cancer    Current Medications: Current Meds  Medication Sig  . acetaminophen (TYLENOL) 500 MG tablet Take 500 mg by mouth every 8 (eight) hours as needed.  [name] allopurinol (ZYLOPRIM) 300 MG tablet TAKE 1 TABLET BY MOUTH  DAILY  . amLODipine (NORVASC) 10 MG tablet Take 1 tablet (10 mg total) by mouth daily.  [name] ascorbic acid (VITAMIN C) 500 MG tablet Take 500 mg by mouth daily.   [name] atorvastatin (LIPITOR) 40 MG tablet TAKE 1 TABLET BY MOUTH  DAILY  . [START ON 09/24/2020] azithromycin (ZITHROMAX) 250 MG tablet Take 2 tabs the first day and then 1 tab daily until you run out.  [name] buPROPion (WELLBUTRIN XL) 300 MG 24 hr tablet Take 1 tablet (300 mg total) by mouth every morning.  . diclofenac sodium (VOLTAREN) 1 % GEL APPLY 4 GRAMS TOPICALLY 4  TIMES DAILY  . fluconazole (DIFLUCAN) 150 MG tablet Take 1 tab, repeat in 48 hours if no improvement.  . fluticasone (FLONASE) 50 MCG/ACT nasal spray USE 2 SPRAYS IN BOTH  NOSTRILS DAILY  . furosemide (LASIX) 20 MG tablet TAKE 1 TABLET BY MOUTH  DAILY AS NEEDED FOR  SWELLING AND WEIGHT GAIN  . haloperidol (HALDOL) 0.5 MG tablet Take 1 tablet at bedtime and second tablet if needed for agitation.  . hydrOXYzine (VISTARIL) 25 MG capsule TAKE 1 CAPSULE BY MOUTH AT  BEDTIME  . losartan (COZAAR) 100 MG tablet Take 1 tablet (100 mg total) by mouth daily.  . pantoprazole (PROTONIX) 40 MG tablet Take 1 tablet once daily.  . predniSONE (DELTASONE) 20 MG tablet Take 2 tablets (40 mg total) by mouth daily with breakfast for 5 days.  . traMADol (ULTRAM) 50 MG tablet TAKE 1 TABLET BY MOUTH  TWICE DAILY AS NEEDED  . [DISCONTINUED] lactobacillus (FLORANEX/LACTINEX) PACK Take 1 packet (1 g total) by mouth 3 (three) times daily with meals.     Allergies:   Aspirin and Sulfamethoxazole-trimethoprim   Social History   Socioeconomic History  . Marital status: Widowed    Spouse name: Not on file  . Number  of children: 3  . Years of education: 52  . Highest education level: Not on file  Occupational History  . Occupation: Retired  Tobacco Use  . Smoking status: Former Smoker    Packs/day: 0.10    Years: 35.00    Pack years: 3.50    Types: Cigarettes    Quit date: 04/08/2013    Years since quitting: 7.4  . Smokeless tobacco: Never Used  . Tobacco comment: smoked a few when her son died in 2013/02/16   Vaping Use  . Vaping Use: Never used  Substance and Sexual Activity  . Alcohol use: No    Alcohol/week: 0.0 standard drinks  . Drug use: No  . Sexual activity: Never  Other Topics Concern  . Not on file  Social History Narrative   Lives with daughter in [place]. Daughter is primary caretaker, surrogate decision-maker, and arranges for [facility] aide and other caregivers to supervise Ms. Olsen at home. Ambulatory   Social Determinants of Health   Financial Resource Strain:   . Difficulty of Paying Living Expenses: Not on file  Food Insecurity:   . Worried About Charity fundraiser in the Last Year: Not on file  . Ran Out of Food in the Last Year: Not on file  Transportation Needs: No Transportation Needs  . Lack of Transportation (Medical): No  . Lack of Transportation (Non-Medical): No  Physical Activity:   . Days of Exercise per Week: Not on file  . Minutes of Exercise per Session: Not on file  Stress:   . Feeling of Stress : Not on file  Social Connections:   . Frequency of Communication with Friends and Family: Not on file  . Frequency of Social Gatherings with Friends and Family: Not on file  . Attends Religious Services: Not on file  . Active Member of Clubs or Organizations: Not on file  . Attends Archivist Meetings: Not on file  . Marital Status: Not on file     Family History: The patient's family history includes Diabetes in her maternal grandfather and mother; Hearing loss in her mother; Heart disease in her mother; Other in her brother; Post-traumatic  stress disorder in her father; Schizophrenia in her maternal grandmother; Stroke in her mother. There is no history of Kidney cancer or Bladder Cancer.  ROS:   Please see the history of present illness.    Review of Systems  Constitutional: Positive for diaphoresis and malaise/fatigue. Negative for chills and fever.  HENT: Positive for congestion and hearing loss.   Eyes: Negative for blurred vision.  Respiratory: Positive for cough and shortness of breath.   Cardiovascular: Positive for chest pain and leg swelling.  Gastrointestinal: Negative for abdominal pain and blood in stool.  Genitourinary: Negative for hematuria.  Musculoskeletal: Positive for myalgias.  Neurological: Negative for loss of consciousness.  Endo/Heme/Allergies: Negative for polydipsia.  Psychiatric/Behavioral: Positive for hallucinations.    EKGs/Labs/Other Studies Reviewed:    The following studies were reviewed today: TTE 08/15/17: Study Conclusions   - Left ventricle: The cavity size was normal. Wall thickness was  normal. Systolic function was vigorous. The estimated ejection  fraction was in the range of 65% to 70%. Wall motion was normal;  there were no regional wall motion abnormalities. Doppler  parameters are consistent with abnormal left ventricular  relaxation (grade 1 diastolic dysfunction).  - Aortic valve: Trileaflet; mildly thickened, mildly calcified  leaflets.  - Left atrium: The atrium was mildly dilated.  - Tricuspid valve: There was trivial regurgitation.   Left ventricle: The cavity size was normal. Wall thickness was  normal. Systolic function was vigorous. The estimated ejection  fraction was in the range of 65% to 70%. Wall motion was normal;  there were no regional wall motion abnormalities. Doppler  parameters are consistent with abnormal left ventricular relaxation  (grade 1 diastolic dysfunction).    -------------------------------------------------------------------  Aortic valve:  Trileaflet; mildly thickened, mildly calcified  leaflets. Mobility was not restricted. Doppler: Transvalvular  velocity was within the normal range. There was no stenosis. There  was no regurgitation.  Peak velocity ratio of LVOT to aortic  valve: 0.61. Valve area (Vmax): 2.54 cm^2. Indexed valve area  (Vmax): 1.19 cm^2/m^2.  Peak gradient (S): 13 mm Hg.   -------------------------------------------------------------------  Aorta: Aortic root: The aortic root was normal in size.   -------------------------------------------------------------------  Mitral valve:  Structurally normal valve.  Mobility was not  restricted. Doppler: Transvalvular velocity was within the normal  range. There was no evidence for stenosis. There was no  regurgitation.  Peak gradient (D): 2 mm Hg.   -------------------------------------------------------------------  Left atrium: The atrium was mildly dilated.   -------------------------------------------------------------------  Right ventricle: The cavity size was normal. Wall thickness was  normal. Systolic function was normal.   -------------------------------------------------------------------  Pulmonic valve:  Poorly visualized. Structurally normal valve.  Cusp separation was normal. Doppler: Transvalvular velocity was  within the normal range. There was no evidence for stenosis. There  was no regurgitation.   -------------------------------------------------------------------  Tricuspid valve:  Structurally normal valve.  Doppler:  Transvalvular velocity was within the normal range. There was  trivial regurgitation.   -------------------------------------------------------------------  Pulmonary artery:  The main pulmonary artery was normal-sized.  Systolic pressure was within the normal range.    -------------------------------------------------------------------  Right atrium: The atrium was normal in size.   -------------------------------------------------------------------  Pericardium: There was no pericardial effusion.   Lower extremity doppler ultrasound 06/2017: FINDINGS: RIGHT LOWER EXTREMITY  Common Femoral Vein: No evidence of thrombus. Normal compressibility, respiratory phasicity and response to augmentation.  Saphenofemoral Junction: No evidence of thrombus. Normal compressibility and flow on color Doppler imaging.  Profunda Femoral Vein: No evidence of thrombus. Normal compressibility and flow on color Doppler imaging.  Femoral Vein: No evidence of thrombus. Normal compressibility, respiratory phasicity and response to augmentation.  Popliteal Vein: No evidence of thrombus. Normal compressibility, respiratory phasicity and response to augmentation.  Calf Veins: No evidence of thrombus. Normal compressibility and flow on color Doppler imaging.  Superficial Great Saphenous Vein: No evidence of thrombus. Normal compressibility and flow on color Doppler imaging.  Venous Reflux:  None.  Other Findings: No evidence of superficial thrombophlebitis or abnormal fluid collection.  LEFT LOWER EXTREMITY  Common Femoral Vein: No evidence of thrombus. Normal compressibility, respiratory phasicity and response to augmentation.  Saphenofemoral Junction: No evidence of thrombus. Normal compressibility and flow on color Doppler imaging.  Profunda Femoral Vein: No evidence of thrombus. Normal compressibility and flow on color Doppler imaging.  Femoral Vein: No evidence of thrombus. Normal compressibility, respiratory phasicity and response to augmentation.  Popliteal Vein: No evidence of thrombus. Normal compressibility, respiratory phasicity and response to augmentation.  Calf Veins: No evidence of thrombus. Normal compressibility and  flow on color Doppler imaging.  Superficial Great Saphenous Vein: No evidence of thrombus. Normal compressibility and flow on color Doppler imaging.  Venous Reflux:  None.  Other Findings: No evidence of superficial thrombophlebitis or abnormal fluid collection.  IMPRESSION: No evidence of DVT within either lower extremity.  EKG:  EKG is  ordered today.  The ekg ordered today demonstrates NSR with HR 92  Recent Labs: 08/30/2020: ALT 23; Hemoglobin 12.2; Platelets 260; TSH 4.62 09/13/2020: BUN 15; Creat 1.39; Potassium 4.4; Sodium 142  Recent Lipid Panel    Component Value Date/Time   CHOL 212 (H) 08/30/2020 1546   TRIG 136 08/30/2020 1546   HDL 49 (L) 08/30/2020 1546   CHOLHDL 4.3 08/30/2020 1546   VLDL 23.6 04/30/2018 1546   LDLCALC 137 (H) 08/30/2020 1546      Physical Exam:    VS:  BP (!) 115/54   Pulse 95   Ht 5' 3.5" (1.613 m)   Wt 284 lb (128.8 kg)   SpO2 98%   BMI 49.52 kg/m  Wt Readings from Last 3 Encounters:  09/22/20 284 lb (128.8 kg)  08/30/20 283 lb 6.4 oz (128.5 kg)  04/12/20 266 lb 6.1 oz (120.8 kg)     GEN:  Visibly dyspneic and tachypneic on exam HEENT: Normal NECK: +JVD CARDIAC: RRR, no murmurs appreciated RESPIRATORY:  Diffuse expiratory wheezing and rhonchi on exam. Very tachypneic ABDOMEN: Obese, soft MUSCULOSKELETAL:  Warm, 2+ pitting edema to the thigh SKIN: Warm and dry NEUROLOGIC:  Alert, minimally interactive, will answer yes and no to question PSYCHIATRIC:  Minimally interactive. Often does not respond to questions (per daughter, this is normal for her and she has underlying schizophrenia)  ASSESSMENT:    1. Acute on chronic diastolic (congestive) heart failure ([place])   2. Hypoxia   3. Essential hypertension   4. TIA (transient ischemic attack)   5. Hyperlipidemia, unspecified hyperlipidemia type    PLAN:    In order of problems listed above:  #Respiratory distress #Hypoxia: Patient visibly dyspneic on arrival  dropping sats to low 80s with ambulation requiring supplemental O2. Likely multifactorial in the setting of suspected volume overload, possible URI, and suspected underlying COPD. Will transfer to the hospital for further management. -Transfer to hospital via EMS with plan to admit to medicine -Continue supplemental O2 -TTE -CXR -Start diuresis with lasix once in the hospital -Likely needs treatment of suspected underlying COPD as diffusely wheezing on exam  #Acute on chronic heart failure Acutely decompensated and grossly overloaded on exam. NYHA class III symptoms and severely limited in terms of activity. Has not had cardiology evaluation in the past. Last TTE in our system with LVEF 65%, G1DD, no significant valvular disease. Will need thorough cardiac evaluation as in-patient. -Diuresis with lasix 40mg  IV BID on arrival to ED -Check TTE -BMET, trop BNP -Likely needs ischemic work-up -Monitor I/Os and daily weights -Low Na diet -Will need regular CV follow-up  #Hypertension: -Continue home losartan pending renal function -Continue home amlopdipine  #HLD: -Check lipid panel -Continue statin  #TIA -ASA 81mg  daily -Lipitor 40mg   #Schizophrenia #Dementia: -Management per primary provider  Transfer to the hospital for further management. Plan discussed at length with the daughter and the patient and they are amenable for transfer.  Total time of encounter: 45 minutes total time of encounter, including 35 minutes spent in face-to-face patient care on the date of this encounter. This time includes coordination of care and counseling regarding above mentioned problem list. Remainder of non-face-to-face time involved reviewing chart documents/testing relevant to the patient encounter and documentation in the medical record. I have independently reviewed documentation from referring provider.   Gwyndolyn Kaufman, MD [employer]  [facility]   Medication Adjustments/Labs and  Tests Ordered: Current medicines are reviewed at length with the patient today.  Concerns regarding medicines are outlined above.  Orders Placed This Encounter  Procedures  . EKG 12-Lead   No orders of the defined types were placed in this encounter.   Patient Instructions  Taken by EMS to [facility] ED.   Accompanied by daughter and has belongings.    Signed, Freada Bergeron, MD  09/22/2020 5:21 PM    [place]

## 2020-09-21 ENCOUNTER — Other Ambulatory Visit: Payer: Self-pay | Admitting: *Deleted

## 2020-09-21 NOTE — Patient Outreach (Signed)
[facility]) Care Management  09/21/2020  TANISHIA LEMASTER 11-05-1940 [id number]   CSW attempted a final outreach call to pt/famliy and was unable to reach. CSW will plan to sign off and advise PCP and [facility] team of above.  Please re-consult CSW if needs/interests change.   [name], MSW, [place] Worker  [place] [phone]

## 2020-09-22 ENCOUNTER — Ambulatory Visit (INDEPENDENT_AMBULATORY_CARE_PROVIDER_SITE_OTHER): Payer: Medicare Other | Admitting: Cardiology

## 2020-09-22 ENCOUNTER — Other Ambulatory Visit: Payer: Medicare Other

## 2020-09-22 ENCOUNTER — Encounter: Payer: Self-pay | Admitting: Cardiology

## 2020-09-22 ENCOUNTER — Encounter (HOSPITAL_COMMUNITY): Payer: Self-pay | Admitting: Internal Medicine

## 2020-09-22 ENCOUNTER — Telehealth (INDEPENDENT_AMBULATORY_CARE_PROVIDER_SITE_OTHER): Payer: Medicare Other | Admitting: Family Medicine

## 2020-09-22 ENCOUNTER — Inpatient Hospital Stay (HOSPITAL_COMMUNITY)
Admission: EM | Admit: 2020-09-22 | Discharge: 2020-09-25 | DRG: 291 | Disposition: A | Discharge status: Home / Self Care | Payer: Medicare Other | Attending: Internal Medicine | Admitting: Internal Medicine

## 2020-09-22 ENCOUNTER — Emergency Department (HOSPITAL_COMMUNITY): Payer: Medicare Other

## 2020-09-22 ENCOUNTER — Other Ambulatory Visit: Payer: Self-pay

## 2020-09-22 ENCOUNTER — Encounter: Payer: Self-pay | Admitting: Family Medicine

## 2020-09-22 VITALS — BP 115/54 | HR 95 | Ht 63.5 in | Wt 284.0 lb

## 2020-09-22 VITALS — Temp 98.4°F

## 2020-09-22 DIAGNOSIS — E785 Hyperlipidemia, unspecified: Secondary | ICD-10-CM | POA: Diagnosis not present

## 2020-09-22 DIAGNOSIS — J180 Bronchopneumonia, unspecified organism: Secondary | ICD-10-CM | POA: Diagnosis present

## 2020-09-22 DIAGNOSIS — F32A Depression, unspecified: Secondary | ICD-10-CM | POA: Diagnosis not present

## 2020-09-22 DIAGNOSIS — R2681 Unsteadiness on feet: Secondary | ICD-10-CM | POA: Diagnosis not present

## 2020-09-22 DIAGNOSIS — N1832 Chronic kidney disease, stage 3b: Secondary | ICD-10-CM | POA: Diagnosis present

## 2020-09-22 DIAGNOSIS — G4733 Obstructive sleep apnea (adult) (pediatric): Secondary | ICD-10-CM | POA: Diagnosis not present

## 2020-09-22 DIAGNOSIS — Z96643 Presence of artificial hip joint, bilateral: Secondary | ICD-10-CM | POA: Diagnosis present

## 2020-09-22 DIAGNOSIS — J90 Pleural effusion, not elsewhere classified: Secondary | ICD-10-CM | POA: Diagnosis not present

## 2020-09-22 DIAGNOSIS — I5031 Acute diastolic (congestive) heart failure: Secondary | ICD-10-CM | POA: Diagnosis not present

## 2020-09-22 DIAGNOSIS — I130 Hypertensive heart and chronic kidney disease with heart failure and stage 1 through stage 4 chronic kidney disease, or unspecified chronic kidney disease: Secondary | ICD-10-CM | POA: Diagnosis not present

## 2020-09-22 DIAGNOSIS — G459 Transient cerebral ischemic attack, unspecified: Secondary | ICD-10-CM

## 2020-09-22 DIAGNOSIS — R0902 Hypoxemia: Secondary | ICD-10-CM

## 2020-09-22 DIAGNOSIS — N183 Chronic kidney disease, stage 3 unspecified: Secondary | ICD-10-CM | POA: Diagnosis present

## 2020-09-22 DIAGNOSIS — Z90710 Acquired absence of both cervix and uterus: Secondary | ICD-10-CM | POA: Diagnosis not present

## 2020-09-22 DIAGNOSIS — Z87891 Personal history of nicotine dependence: Secondary | ICD-10-CM | POA: Diagnosis not present

## 2020-09-22 DIAGNOSIS — R21 Rash and other nonspecific skin eruption: Secondary | ICD-10-CM | POA: Diagnosis not present

## 2020-09-22 DIAGNOSIS — Z6841 Body Mass Index (BMI) 40.0 and over, adult: Secondary | ICD-10-CM

## 2020-09-22 DIAGNOSIS — I5033 Acute on chronic diastolic (congestive) heart failure: Secondary | ICD-10-CM | POA: Diagnosis not present

## 2020-09-22 DIAGNOSIS — J40 Bronchitis, not specified as acute or chronic: Secondary | ICD-10-CM | POA: Diagnosis not present

## 2020-09-22 DIAGNOSIS — Z20822 Contact with and (suspected) exposure to covid-19: Secondary | ICD-10-CM | POA: Diagnosis present

## 2020-09-22 DIAGNOSIS — I110 Hypertensive heart disease with heart failure: Secondary | ICD-10-CM | POA: Diagnosis not present

## 2020-09-22 DIAGNOSIS — K219 Gastro-esophageal reflux disease without esophagitis: Secondary | ICD-10-CM | POA: Diagnosis present

## 2020-09-22 DIAGNOSIS — Z8543 Personal history of malignant neoplasm of ovary: Secondary | ICD-10-CM

## 2020-09-22 DIAGNOSIS — M109 Gout, unspecified: Secondary | ICD-10-CM | POA: Diagnosis present

## 2020-09-22 DIAGNOSIS — R899 Unspecified abnormal finding in specimens from other organs, systems and tissues: Secondary | ICD-10-CM | POA: Diagnosis not present

## 2020-09-22 DIAGNOSIS — J440 Chronic obstructive pulmonary disease with acute lower respiratory infection: Secondary | ICD-10-CM | POA: Diagnosis present

## 2020-09-22 DIAGNOSIS — Z8673 Personal history of transient ischemic attack (TIA), and cerebral infarction without residual deficits: Secondary | ICD-10-CM

## 2020-09-22 DIAGNOSIS — R410 Disorientation, unspecified: Secondary | ICD-10-CM | POA: Diagnosis not present

## 2020-09-22 DIAGNOSIS — Z8541 Personal history of malignant neoplasm of cervix uteri: Secondary | ICD-10-CM

## 2020-09-22 DIAGNOSIS — H9190 Unspecified hearing loss, unspecified ear: Secondary | ICD-10-CM | POA: Diagnosis present

## 2020-09-22 DIAGNOSIS — I10 Essential (primary) hypertension: Secondary | ICD-10-CM | POA: Diagnosis not present

## 2020-09-22 DIAGNOSIS — J441 Chronic obstructive pulmonary disease with (acute) exacerbation: Secondary | ICD-10-CM

## 2020-09-22 DIAGNOSIS — I5032 Chronic diastolic (congestive) heart failure: Secondary | ICD-10-CM

## 2020-09-22 DIAGNOSIS — M25559 Pain in unspecified hip: Secondary | ICD-10-CM | POA: Diagnosis not present

## 2020-09-22 DIAGNOSIS — J9601 Acute respiratory failure with hypoxia: Secondary | ICD-10-CM | POA: Diagnosis not present

## 2020-09-22 DIAGNOSIS — R0602 Shortness of breath: Secondary | ICD-10-CM | POA: Diagnosis not present

## 2020-09-22 DIAGNOSIS — F209 Schizophrenia, unspecified: Secondary | ICD-10-CM | POA: Diagnosis present

## 2020-09-22 DIAGNOSIS — F0390 Unspecified dementia without behavioral disturbance: Secondary | ICD-10-CM | POA: Diagnosis present

## 2020-09-22 DIAGNOSIS — I509 Heart failure, unspecified: Secondary | ICD-10-CM

## 2020-09-22 DIAGNOSIS — I517 Cardiomegaly: Secondary | ICD-10-CM | POA: Diagnosis not present

## 2020-09-22 DIAGNOSIS — R269 Unspecified abnormalities of gait and mobility: Secondary | ICD-10-CM | POA: Diagnosis not present

## 2020-09-22 DIAGNOSIS — D631 Anemia in chronic kidney disease: Secondary | ICD-10-CM | POA: Diagnosis present

## 2020-09-22 DIAGNOSIS — R609 Edema, unspecified: Secondary | ICD-10-CM | POA: Diagnosis not present

## 2020-09-22 LAB — RESP PANEL BY RT-PCR (FLU A&B, COVID) ARPGX2
Influenza A by PCR: NEGATIVE
Influenza B by PCR: NEGATIVE
SARS Coronavirus 2 by RT PCR: NEGATIVE

## 2020-09-22 LAB — COMPREHENSIVE METABOLIC PANEL
ALT: 21 U/L (ref 0–44)
AST: 20 U/L (ref 15–41)
Albumin: 3.6 g/dL (ref 3.5–5.0)
Alkaline Phosphatase: 95 U/L (ref 38–126)
Anion gap: 11 (ref 5–15)
BUN: 18 mg/dL (ref 8–23)
CO2: 24 mmol/L (ref 22–32)
Calcium: 9.4 mg/dL (ref 8.9–10.3)
Chloride: 104 mmol/L (ref 98–111)
Creatinine, Ser: 1.61 mg/dL — ABNORMAL HIGH (ref 0.44–1.00)
GFR, Estimated: 32 mL/min — ABNORMAL LOW (ref >60)
Glucose, Bld: 126 mg/dL — ABNORMAL HIGH (ref 70–99)
Potassium: 5.1 mmol/L (ref 3.5–5.1)
Sodium: 139 mmol/L (ref 135–145)
Total Bilirubin: 0.3 mg/dL (ref 0.3–1.2)
Total Protein: 7.1 g/dL (ref 6.5–8.1)

## 2020-09-22 LAB — CBC WITH DIFFERENTIAL/PLATELET
Abs Immature Granulocytes: 0.07 10*3/uL (ref 0.00–0.07)
Basophils Absolute: 0 10*3/uL (ref 0.0–0.1)
Basophils Relative: 0 %
Eosinophils Absolute: 0.2 10*3/uL (ref 0.0–0.5)
Eosinophils Relative: 2 %
HCT: 38.5 % (ref 36.0–46.0)
Hemoglobin: 11.8 g/dL — ABNORMAL LOW (ref 12.0–15.0)
Immature Granulocytes: 1 %
Lymphocytes Relative: 17 %
Lymphs Abs: 1.7 10*3/uL (ref 0.7–4.0)
MCH: 30.3 pg (ref 26.0–34.0)
MCHC: 30.6 g/dL (ref 30.0–36.0)
MCV: 99 fL (ref 80.0–100.0)
Monocytes Absolute: 0.6 10*3/uL (ref 0.1–1.0)
Monocytes Relative: 5 %
Neutro Abs: 7.5 10*3/uL (ref 1.7–7.7)
Neutrophils Relative %: 75 %
Platelets: 262 10*3/uL (ref 150–400)
RBC: 3.89 MIL/uL (ref 3.87–5.11)
RDW: 15.9 % — ABNORMAL HIGH (ref 11.5–15.5)
WBC: 10.1 10*3/uL (ref 4.0–10.5)
nRBC: 0 % (ref 0.0–0.2)

## 2020-09-22 LAB — BRAIN NATRIURETIC PEPTIDE: B Natriuretic Peptide: 35.2 pg/mL (ref 0.0–100.0)

## 2020-09-22 LAB — TROPONIN I (HIGH SENSITIVITY)
Troponin I (High Sensitivity): 10 ng/L (ref <18)
Troponin I (High Sensitivity): 12 ng/L (ref <18)

## 2020-09-22 MED ORDER — FLUCONAZOLE 150 MG PO TABS: ORAL_TABLET | ORAL | 0 refills | Status: DC

## 2020-09-22 MED ORDER — ALLOPURINOL 300 MG PO TABS
300.0000 mg | ORAL_TABLET | Freq: Every day | ORAL | Status: DC
  Administered 2020-09-23 – 2020-09-25 (×3): 300 mg via ORAL
  Filled 2020-09-22 (×3): qty 1

## 2020-09-22 MED ORDER — ATORVASTATIN CALCIUM 40 MG PO TABS
40.0000 mg | ORAL_TABLET | Freq: Every day | ORAL | Status: DC
  Administered 2020-09-22 – 2020-09-24 (×3): 40 mg via ORAL
  Filled 2020-09-22 (×3): qty 1

## 2020-09-22 MED ORDER — HYDROXYZINE PAMOATE 25 MG PO CAPS
25.0000 mg | ORAL_CAPSULE | Freq: Every day | ORAL | Status: DC
  Filled 2020-09-22: qty 1

## 2020-09-22 MED ORDER — ALBUTEROL SULFATE HFA 108 (90 BASE) MCG/ACT IN AERS
4.0000 | INHALATION_SPRAY | Freq: Once | RESPIRATORY_TRACT | Status: AC
  Administered 2020-09-22: 4 via RESPIRATORY_TRACT
  Filled 2020-09-22: qty 6.7

## 2020-09-22 MED ORDER — ALBUTEROL SULFATE (2.5 MG/3ML) 0.083% IN NEBU
2.5000 mg | INHALATION_SOLUTION | RESPIRATORY_TRACT | Status: DC
  Administered 2020-09-22 – 2020-09-23 (×3): 2.5 mg via RESPIRATORY_TRACT
  Filled 2020-09-22 (×3): qty 3

## 2020-09-22 MED ORDER — BUDESONIDE 0.25 MG/2ML IN SUSP
0.2500 mg | Freq: Two times a day (BID) | RESPIRATORY_TRACT | Status: DC
  Administered 2020-09-22 – 2020-09-25 (×6): 0.25 mg via RESPIRATORY_TRACT
  Filled 2020-09-22 (×6): qty 2

## 2020-09-22 MED ORDER — IPRATROPIUM BROMIDE HFA 17 MCG/ACT IN AERS
4.0000 | INHALATION_SPRAY | Freq: Once | RESPIRATORY_TRACT | Status: AC
  Administered 2020-09-22: 4 via RESPIRATORY_TRACT
  Filled 2020-09-22: qty 12.9

## 2020-09-22 MED ORDER — TRAMADOL HCL 50 MG PO TABS
50.0000 mg | ORAL_TABLET | Freq: Every day | ORAL | Status: DC
  Administered 2020-09-24 – 2020-09-25 (×2): 50 mg via ORAL
  Filled 2020-09-22 (×3): qty 1

## 2020-09-22 MED ORDER — PANTOPRAZOLE SODIUM 40 MG PO TBEC
40.0000 mg | DELAYED_RELEASE_TABLET | Freq: Every day | ORAL | Status: DC
  Administered 2020-09-22 – 2020-09-24 (×3): 40 mg via ORAL
  Filled 2020-09-22 (×3): qty 1

## 2020-09-22 MED ORDER — IPRATROPIUM BROMIDE 0.02 % IN SOLN
0.5000 mg | RESPIRATORY_TRACT | Status: DC
  Administered 2020-09-22 – 2020-09-23 (×3): 0.5 mg via RESPIRATORY_TRACT
  Filled 2020-09-22 (×3): qty 2.5

## 2020-09-22 MED ORDER — SODIUM CHLORIDE 0.9 % IV SOLN
100.0000 mg | Freq: Two times a day (BID) | INTRAVENOUS | Status: DC
  Administered 2020-09-22 – 2020-09-23 (×3): 100 mg via INTRAVENOUS
  Filled 2020-09-22 (×4): qty 100

## 2020-09-22 MED ORDER — ENOXAPARIN SODIUM 40 MG/0.4ML ~~LOC~~ SOLN
40.0000 mg | Freq: Every day | SUBCUTANEOUS | Status: DC
  Administered 2020-09-22 – 2020-09-24 (×3): 40 mg via SUBCUTANEOUS
  Filled 2020-09-22 (×3): qty 0.4

## 2020-09-22 MED ORDER — AEROCHAMBER PLUS FLO-VU LARGE MISC
Status: AC
  Administered 2020-09-22: 1
  Filled 2020-09-22: qty 1

## 2020-09-22 MED ORDER — AZITHROMYCIN 250 MG PO TABS: ORAL_TABLET | ORAL | 0 refills | Status: DC

## 2020-09-22 MED ORDER — HALOPERIDOL 0.5 MG PO TABS
0.5000 mg | ORAL_TABLET | Freq: Two times a day (BID) | ORAL | Status: DC
  Administered 2020-09-22 – 2020-09-25 (×6): 0.5 mg via ORAL
  Filled 2020-09-22 (×7): qty 1

## 2020-09-22 MED ORDER — ALBUTEROL SULFATE (2.5 MG/3ML) 0.083% IN NEBU: 2.5000 mg | INHALATION_SOLUTION | RESPIRATORY_TRACT | Status: DC | PRN

## 2020-09-22 MED ORDER — VITAMIN B-12 1000 MCG PO TABS
1000.0000 ug | ORAL_TABLET | Freq: Every day | ORAL | Status: DC
  Administered 2020-09-23 – 2020-09-25 (×3): 1000 ug via ORAL
  Filled 2020-09-22 (×3): qty 1

## 2020-09-22 MED ORDER — ASCORBIC ACID 500 MG PO TABS
500.0000 mg | ORAL_TABLET | Freq: Every day | ORAL | Status: DC
  Administered 2020-09-23 – 2020-09-25 (×3): 500 mg via ORAL
  Filled 2020-09-22 (×3): qty 1

## 2020-09-22 MED ORDER — ACETAMINOPHEN 650 MG RE SUPP: 650.0000 mg | Freq: Four times a day (QID) | RECTAL | Status: DC | PRN

## 2020-09-22 MED ORDER — AMLODIPINE BESYLATE 10 MG PO TABS
10.0000 mg | ORAL_TABLET | Freq: Every day | ORAL | Status: DC
  Administered 2020-09-23 – 2020-09-25 (×3): 10 mg via ORAL
  Filled 2020-09-22 (×3): qty 1

## 2020-09-22 MED ORDER — HYDROXYZINE PAMOATE 25 MG PO CAPS: 25.0000 mg | ORAL_CAPSULE | Freq: Every day | ORAL | Status: DC

## 2020-09-22 MED ORDER — PREDNISONE 20 MG PO TABS: 40.0000 mg | ORAL_TABLET | Freq: Every day | ORAL | 0 refills | Status: DC

## 2020-09-22 MED ORDER — FUROSEMIDE 10 MG/ML IJ SOLN
40.0000 mg | Freq: Two times a day (BID) | INTRAMUSCULAR | Status: DC
  Administered 2020-09-22 – 2020-09-24 (×4): 40 mg via INTRAVENOUS
  Filled 2020-09-22 (×5): qty 4

## 2020-09-22 MED ORDER — LOSARTAN POTASSIUM 50 MG PO TABS
100.0000 mg | ORAL_TABLET | Freq: Every day | ORAL | Status: DC
  Administered 2020-09-23 – 2020-09-25 (×2): 100 mg via ORAL
  Filled 2020-09-22 (×3): qty 2

## 2020-09-22 MED ORDER — ACETAMINOPHEN 325 MG PO TABS: 650.0000 mg | ORAL_TABLET | Freq: Four times a day (QID) | ORAL | Status: DC | PRN

## 2020-09-22 MED ORDER — BUPROPION HCL ER (XL) 300 MG PO TB24
300.0000 mg | ORAL_TABLET | Freq: Every morning | ORAL | Status: DC
  Administered 2020-09-23 – 2020-09-25 (×3): 300 mg via ORAL
  Filled 2020-09-22 (×3): qty 1

## 2020-09-22 MED ORDER — HYDROXYZINE HCL 25 MG PO TABS
25.0000 mg | ORAL_TABLET | Freq: Every day | ORAL | Status: DC
  Administered 2020-09-22 – 2020-09-24 (×3): 25 mg via ORAL
  Filled 2020-09-22 (×3): qty 1

## 2020-09-22 MED ORDER — METHYLPREDNISOLONE SODIUM SUCC 125 MG IJ SOLR
125.0000 mg | Freq: Once | INTRAMUSCULAR | Status: AC
  Administered 2020-09-22: 125 mg via INTRAVENOUS
  Filled 2020-09-22: qty 2

## 2020-09-22 MED ORDER — AEROCHAMBER PLUS FLO-VU MEDIUM MISC
1.0000 | Freq: Once | Status: AC
  Filled 2020-09-22: qty 1

## 2020-09-22 MED ORDER — DICLOFENAC SODIUM 1 % TD GEL: 4.0000 g | Freq: Every day | TRANSDERMAL | Status: DC | PRN

## 2020-09-22 NOTE — H&P (Signed)
History and Physical    [name] NLG:[id number] DOB: [birth year] DOA: 09/22/2020  PCP: Vivi Barrack, MD  Patient coming from: Home.  Chief Complaint: Shortness of breath.  Most of the history was obtained from patient's daughter.  HPI: [name] is a 79 y.o. female with history of diastolic CHF last 2D echo done in 2018 showed EF of 65 to 70% with grade 1 diastolic dysfunction with chronic kidney disease stage III, hypertension, gout previous history of tobacco abuse has been experiencing increasing shortness of breath over the last 2 to 3 weeks.  As per patient's daughter patient's Lasix was discontinued in the first week of November after patient's creatinine was found to be increasing by her primary care physician.  Since then patient has been noticing increasing bilateral lower extremity edema shortness of breath.  Over the last 5 days patient also has been having productive cough wheezing which patient's primary care physician started patient on prednisone antibiotics.  Denies any chest pain.  ED Course: In the ER patient was hypoxic requiring 3 L oxygen which is new for the patient also was found to be wheezing with chest x-ray showing possible infiltrates.  Labs are showing BNP of 35 high sensitive point of 10 EKG showing normal sinus rhythm creatinine 1.6 hemoglobin 11.8 Covid test was negative.  Patient was given steroids nebulizer treatment and Lasix 40 mg.  Cardiology was consulted.  On exam patient has bilateral lower extremity edema extending up to thighs.  Review of Systems: As per HPI, rest all negative.   Past Medical History:  Diagnosis Date  . Chronic venous insufficiency   . Colon, diverticulosis Sept. 2011   outpatient colonoscopy by Dr. Cristina Gong.  Need record  . DDD (degenerative disc disease), lumbosacral   . Depression   . DJD (degenerative joint disease), multiple sites    Low back pain worst  . GERD (gastroesophageal reflux disease)   . Gout   .  Gout    Uric Acid level 4.2 on 300 allopurinol  . History of cervical cancer 1972  . Hx-TIA (transient ischemic attack) 05/13/2001   Right facial numbness  . Hx-TIA (transient ischemic attack) 05/13/2001   Looking back in Epic, the patient had a hospitalization in July 2002 for evaluation of questionable TIA (s/s of right facial numbness associated with mild blurred vision in her right eye, diaphoresis, and mild confusion) with negative CT head and negative MRI/MRA and was started on Plavix after being heparinized. She was to have possible follow up with Neurology for reevaluation of the need for Pl  . Hyperlipidemia   . Hypertension   . Keloid 08/01/2018  . Memory disorder 12/20/2016  . Mitral valve prolapse 10/27/1999   Subsequent 2D echo show normal mitral valve  . Osteoarthritis of hip    bilateral hips  . Ovarian cancer ([place]) 1972   S/P oophorectomy  . Psychosis ([place])   . Recurrent boils    History of MRSA skin infections with abscess  . Sensorineural hearing loss of both ears   . Subdural hematoma ([place]) 01/26/2017   bilateral  . Uncomplicated opioid dependence ([place]) 01/31/2018    Past Surgical History:  Procedure Laterality Date  . ABDOMINAL HYSTERECTOMY     1972  . BURR HOLE Bilateral 02/12/2017   Procedure: Haskell Flirt;  Surgeon: Earnie Larsson, MD;  Location: [place];  Service: Neurosurgery;  Laterality: Bilateral;  . CERVICAL DISCECTOMY  7/07   C5-C6  . JOINT REPLACEMENT  bilateral hip replacement  . LUMBAR DISC SURGERY     L5-S1 7/07  . LUMBAR LAMINECTOMY/DECOMPRESSION MICRODISCECTOMY N/A 02/02/2016   Procedure: L4-5 Decompression;  Surgeon: Marybelle Killings, MD;  Location: [facility];  Service: Orthopedics;  Laterality: N/A;  . [place] for ovarian cancer     reports that she quit smoking about 7 years ago. Her smoking use included cigarettes. She has a 3.50 pack-year smoking history. She has never used smokeless tobacco. She reports that she does not drink alcohol and  does not use drugs.  Allergies  Allergen Reactions  . Aspirin Other (See Comments)    Bleeding from vagina   . Sulfamethoxazole-Trimethoprim Itching    Family History  Problem Relation Age of Onset  . Diabetes Mother   . Heart disease Mother   . Hearing loss Mother   . Stroke Mother   . Post-traumatic stress disorder Father   . Schizophrenia Maternal Grandmother   . Diabetes Maternal Grandfather   . Other Brother        brother died in mental health hospital  . Kidney cancer Neg Hx   . Bladder Cancer Neg Hx     Prior to Admission medications   Medication Sig Start Date End Date Taking? Authorizing Provider  acetaminophen (TYLENOL) 500 MG tablet Take 500 mg by mouth at bedtime.    Yes [provider]  allopurinol (ZYLOPRIM) 300 MG tablet TAKE 1 TABLET BY MOUTH  DAILY Patient taking differently: Take 300 mg by mouth daily.  07/29/20  Yes Vivi Barrack, MD  amLODipine (NORVASC) 10 MG tablet Take 1 tablet (10 mg total) by mouth daily. 12/19/19  Yes Vivi Barrack, MD  ascorbic acid (VITAMIN C) 500 MG tablet Take 500 mg by mouth daily.    Yes [provider]  atorvastatin (LIPITOR) 40 MG tablet TAKE 1 TABLET BY MOUTH  DAILY Patient taking differently: Take 40 mg by mouth at bedtime.  07/29/20  Yes Vivi Barrack, MD  buPROPion (WELLBUTRIN XL) 300 MG 24 hr tablet Take 1 tablet (300 mg total) by mouth every morning. 08/31/20  Yes Arfeen, Arlyce Harman, MD  Cyanocobalamin (VITAMIN B12) 1000 MCG TBCR Take 1 tablet by mouth daily.   Yes [provider]  diclofenac sodium (VOLTAREN) 1 % GEL APPLY 4 GRAMS TOPICALLY 4  TIMES DAILY Patient taking differently: Apply 4 g topically daily as needed (pain).  09/09/19  Yes Vivi Barrack, MD  furosemide (LASIX) 20 MG tablet TAKE 1 TABLET BY MOUTH  DAILY AS NEEDED FOR  SWELLING AND WEIGHT GAIN Patient taking differently: Take 20 mg by mouth daily as needed for fluid or edema.  08/04/20  Yes Vivi Barrack, MD  haloperidol  (HALDOL) 0.5 MG tablet Take 1 tablet at bedtime and second tablet if needed for agitation. Patient taking differently: Take 0.5 mg by mouth 2 (two) times daily. Take 1 tablet at bedtime and second tablet if needed for agitation. 08/31/20  Yes Arfeen, Arlyce Harman, MD  hydrOXYzine (VISTARIL) 25 MG capsule TAKE 1 CAPSULE BY MOUTH AT  BEDTIME Patient taking differently: Take 25 mg by mouth at bedtime.  08/04/20  Yes Vivi Barrack, MD  losartan (COZAAR) 100 MG tablet Take 1 tablet (100 mg total) by mouth daily. 07/29/20  Yes Vivi Barrack, MD  pantoprazole (PROTONIX) 40 MG tablet Take 1 tablet once daily. Patient taking differently: Take 40 mg by mouth at bedtime.  11/06/19  Yes Vivi Barrack,  MD  traMADol (ULTRAM) 50 MG tablet TAKE 1 TABLET BY MOUTH  TWICE DAILY AS NEEDED Patient taking differently: Take 50 mg by mouth daily.  05/28/20  Yes Vivi Barrack, MD  azithromycin (ZITHROMAX) 250 MG tablet Take 2 tabs the first day and then 1 tab daily until you run out. 09/24/20   Shelda Pal, DO  fluconazole (DIFLUCAN) 150 MG tablet Take 1 tab, repeat in 48 hours if no improvement. Patient not taking: Reported on 09/22/2020 09/22/20   Shelda Pal, DO  fluticasone [facility]) 50 MCG/ACT nasal spray USE 2 SPRAYS IN BOTH  NOSTRILS DAILY Patient taking differently: Place 2 sprays into both nostrils daily as needed for allergies.  05/28/20   Vivi Barrack, MD  predniSONE (DELTASONE) 20 MG tablet Take 2 tablets (40 mg total) by mouth daily with breakfast for 5 days. 09/22/20 09/27/20  Shelda Pal, DO    Physical Exam: Constitutional: Moderately built and nourished. Vitals:   09/22/20 1845 09/22/20 1917 09/22/20 2033 09/22/20 2045  BP: 136/73 (!) 140/54 (!) 143/62 136/63  Pulse: 84 89 78 75  Resp: (!) 23 17 19 20   Temp:      TempSrc:      SpO2: 95% 97% 99% 93%   Eyes: Anicteric no pallor. ENMT: No discharge from the ears eyes nose or mouth. Neck: No JVD appreciated no mass  felt. Respiratory: Mild expiratory wheeze and no crepitations. Cardiovascular: S1-S2 heard. Abdomen: Soft nontender bowel sounds present. Musculoskeletal: Bilateral lower extremity edema present.  Skin: No rash. Neurologic: Alert awake oriented to her name and place moves all extremities. Psychiatric: Appears normal.  Normal affect.   Labs on Admission: I have personally reviewed following labs and imaging studies  CBC: Recent Labs  Lab 09/22/20 1747  WBC 10.1  NEUTROABS 7.5  HGB 11.8*  HCT 38.5  MCV 99.0  PLT 818   Basic Metabolic Panel: Recent Labs  Lab 09/22/20 1747  NA 139  K 5.1  CL 104  CO2 24  GLUCOSE 126*  BUN 18  CREATININE 1.61*  CALCIUM 9.4   GFR: Estimated Creatinine Clearance: 37.4 mL/min (A) (by C-G formula based on SCr of 1.61 mg/dL (H)). Liver Function Tests: Recent Labs  Lab 09/22/20 1747  AST 20  ALT 21  ALKPHOS 95  BILITOT 0.3  PROT 7.1  ALBUMIN 3.6   No results for input(s): LIPASE, AMYLASE in the last 168 hours. No results for input(s): AMMONIA in the last 168 hours. Coagulation Profile: No results for input(s): INR, PROTIME in the last 168 hours. Cardiac Enzymes: No results for input(s): CKTOTAL, CKMB, CKMBINDEX, TROPONINI in the last 168 hours. BNP (last 3 results) No results for input(s): PROBNP in the last 8760 hours. HbA1C: No results for input(s): HGBA1C in the last 72 hours. CBG: No results for input(s): GLUCAP in the last 168 hours. Lipid Profile: No results for input(s): CHOL, HDL, LDLCALC, TRIG, CHOLHDL, LDLDIRECT in the last 72 hours. Thyroid Function Tests: No results for input(s): TSH, T4TOTAL, FREET4, T3FREE, THYROIDAB in the last 72 hours. Anemia Panel: No results for input(s): VITAMINB12, FOLATE, FERRITIN, TIBC, IRON, RETICCTPCT in the last 72 hours. Urine analysis:    Component Value Date/Time   COLORURINE YELLOW 08/30/2020 1546   APPEARANCEUR CLEAR 08/30/2020 1546   APPEARANCEUR Clear 05/07/2017 1614    LABSPEC 1.014 08/30/2020 1546   PHURINE 5.5 08/30/2020 1546   GLUCOSEU NEGATIVE 08/30/2020 [place] 07/31/2017 1511   HGBUR NEGATIVE 08/30/2020 1546  HGBUR trace-intact 11/28/2007 1531   BILIRUBINUR Negative 09/01/2019 1532   BILIRUBINUR Negative 05/07/2017 1614   KETONESUR NEGATIVE 08/30/2020 1546   PROTEINUR TRACE (A) 08/30/2020 1546   UROBILINOGEN 0.2 09/01/2019 1532   UROBILINOGEN 0.2 11/04/2017 1904   NITRITE NEGATIVE 08/30/2020 1546   LEUKOCYTESUR NEGATIVE 08/30/2020 1546   Sepsis Labs: @LABRCNTIP (procalcitonin:4,lacticidven:4) ) Recent Results (from the past 240 hour(s))  Resp Panel by RT-PCR (Flu A&B, Covid) Nasopharyngeal Swab     Status: None   Collection Time: 09/22/20  5:53 PM   Specimen: Nasopharyngeal Swab; Nasopharyngeal(NP) swabs in vial transport medium  Result Value Ref Range Status   SARS Coronavirus 2 by RT PCR NEGATIVE NEGATIVE Final    Comment: (NOTE) SARS-CoV-2 target nucleic acids are NOT DETECTED.  The SARS-CoV-2 RNA is generally detectable in upper respiratory specimens during the acute phase of infection. The lowest concentration of SARS-CoV-2 viral copies this assay can detect is 138 copies/mL. A negative result does not preclude SARS-Cov-2 infection and should not be used as the sole basis for treatment or other patient management decisions. A negative result may occur with  improper specimen collection/handling, submission of specimen other than nasopharyngeal swab, presence of viral mutation(s) within the areas targeted by this assay, and inadequate number of viral copies(<138 copies/mL). A negative result must be combined with clinical observations, patient history, and epidemiological information. The expected result is Negative.  Fact Sheet for Patients:  EntrepreneurPulse.com.au  Fact Sheet for Healthcare Providers:  IncredibleEmployment.be  This test is no t yet approved or cleared by  the [place] FDA and  has been authorized for detection and/or diagnosis of SARS-CoV-2 by FDA under an Emergency Use Authorization (EUA). This EUA will remain  in effect (meaning this test can be used) for the duration of the COVID-19 declaration under Section 564(b)(1) of the Act, 21 U.S.C.section 360bbb-3(b)(1), unless the authorization is terminated  or revoked sooner.       Influenza A by PCR NEGATIVE NEGATIVE Final   Influenza B by PCR NEGATIVE NEGATIVE Final    Comment: (NOTE) The Xpert Xpress SARS-CoV-2/FLU/RSV plus assay is intended as an aid in the diagnosis of influenza from Nasopharyngeal swab specimens and should not be used as a sole basis for treatment. Nasal washings and aspirates are unacceptable for Xpert Xpress SARS-CoV-2/FLU/RSV testing.  Fact Sheet for Patients: EntrepreneurPulse.com.au  Fact Sheet for Healthcare Providers: IncredibleEmployment.be  This test is not yet approved or cleared by the [place] FDA and has been authorized for detection and/or diagnosis of SARS-CoV-2 by FDA under an Emergency Use Authorization (EUA). This EUA will remain in effect (meaning this test can be used) for the duration of the COVID-19 declaration under Section 564(b)(1) of the Act, 21 U.S.C. section 360bbb-3(b)(1), unless the authorization is terminated or revoked.  Performed at [facility], [place] [address], [place], [place] [address]      Radiological Exams on Admission: DG Chest Port 1 View  Result Date: 09/22/2020 CLINICAL DATA:  79 year old female with shortness of breath. EXAM: PORTABLE CHEST 1 VIEW COMPARISON:  Chest radiograph dated 12/19/2019. FINDINGS: Bibasilar streaky densities may represent atelectasis or infiltrate. No pleural effusion pneumothorax. Mild cardiomegaly. No acute osseous pathology. IMPRESSION: Bibasilar streaky densities may represent atelectasis or infiltrate. Electronically Signed   By:  Anner Crete M.D.   On: 09/22/2020 17:43    EKG: Independently reviewed.  Normal sinus rhythm.  Assessment/Plan Principal Problem:   Acute on chronic diastolic CHF (congestive heart failure) (HCC) Active Problems:  Schizophrenia, chronic condition ([place])   Chronic kidney disease (CKD), stage III (moderate) (HCC)   OSA (obstructive sleep apnea)   Acute CHF (congestive heart failure) ([place])    1. Acute on chronic diastolic CHF last EF measured was 65 to 70% with grade 1 diastolic dysfunction for which cardiology was consulted and appreciate their input.  On Lasix 40 mg IV every 12.  2D echo has been ordered.  Note that patient has chronic disease and is on ARB in addition to the Lasix. 2. Possible pneumonia with bronchitis for which patient is on empiric antibiotics check sputum cultures and if there is further wheezing may add steroids.  Presently on nebulizer and Pulmicort. 3. Chronic kidney disease stage III creatinine appears to be at baseline.  Note that patient is on IV Lasix and ARB. 4. Hypertension on ARB and amlodipine. 5. History of schizophrenia on Haldol and Wellbutrin. 6. Anemia follow CBC. 7. History of gout on allopurinol. 8. Hyperlipidemia on statins.  Since patient has acute CHF and possible bronchitis with pneumonia will need inpatient status.   DVT prophylaxis: Lovenox. Code Status: Full code. Family Communication: Patient's daughter. Disposition Plan: Home. Consults called: Cardiology. Admission status: Inpatient.   Rise Patience MD [facility] Pager [phone].  If 7PM-7AM, please contact night-coverage [email or web address] Password [facility]  09/22/2020, 9:52 PM

## 2020-09-22 NOTE — Patient Instructions (Signed)
Taken by EMS to [facility] ED.   Accompanied by daughter and has belongings.

## 2020-09-22 NOTE — Consult Note (Signed)
Cardiology Consultation:   Patient ID: [name] MRN: [id number]; DOB: [birth year]  Admit date: 09/22/2020 Date of Consult: 09/22/2020  Primary Care Provider: Vivi Barrack, MD [place] Cardiologist: No primary care provider on file.  [facility] Electrophysiologist:  None    Patient Profile:   [name] is a 79 y.o. female with a hx of chronic systolic heart failure, prior tobacco abuse, TIA, schizophrenia, dementia, and CKD who is being seen today for the evaluation of acute on chronic heart failure exacerbation at the request of Dr. Maryan Rued.  History of Present Illness:   [name] is a 79 year old female with history of HTN, HLD, chronic venous insufficiency, CKD, chronic diastolic heart failure, ovarian cancer s/p oophrectomy, schizophrenia and dementia who presented to cardiology clinic today found to be dyspneic, hypoxic and grossly overloaded on examination prompting referral to the ED for further management.  History obtained mainly by the daughter who is the patient's primary caretaker as patient is intermittently interactive and confused at baseline. She has not regularly been followed by a cardiologist and has just recently been aware of a diagnosis of diastolic heart failure. The daughter was concerned about her mother's worsening LE swelling, weight gain, shortness of breath and chest pain which prompted her to make an appointment today.   Patient has a history of CKD with chronic LE edema for which she has been on lasix. Over the past several months, her weight has increased from 230lbs to 280lbs. Her lasix has been increased without significant improvement. During a visit earlier in Novermber with her PCP, her Cr had bumped and therefore her lasix was held with improvement in renal function. The patient also has long-standing dyspnea on exertion which has been progressively worsening over the past several months. Specifically, she is unable to  ambulate more than a couple feet without needing to stop to catch her breath. She has a history of tobacco use but no known diagnosis of COPD. Not on home oxygen. She has never had an ischemic work-up that she knows of. Per the daughter, she has been complaining of intermittent chest pressure but main complaint has been shortness of breath.  On arrival to Cardiology clinic, the patient was notably very dyspneic with active wheezing. Oxygen saturations were in the 80's. She was placed on 3L [place] with improvement of sats to the mid-90s. Exam notable for 2+ pitting edema to the thighs with diffuse expiratory wheezing and rhonchi on exam. Given hypoxia, respiratory distress, and evidence of significant volume overload, the decision was made to transfer to the hospital for further management.   Past Medical History:  Diagnosis Date  . Chronic venous insufficiency   . Colon, diverticulosis Sept. 2011   outpatient colonoscopy by Dr. Cristina Gong.  Need record  . DDD (degenerative disc disease), lumbosacral   . Depression   . DJD (degenerative joint disease), multiple sites    Low back pain worst  . GERD (gastroesophageal reflux disease)   . Gout   . Gout    Uric Acid level 4.2 on 300 allopurinol  . History of cervical cancer 1972  . Hx-TIA (transient ischemic attack) 05/13/2001   Right facial numbness  . Hx-TIA (transient ischemic attack) 05/13/2001   Looking back in Epic, the patient had a hospitalization in July 2002 for evaluation of questionable TIA (s/s of right facial numbness associated with mild blurred vision in her right eye, diaphoresis, and mild confusion) with negative CT head and negative MRI/MRA and was  started on Plavix after being heparinized. She was to have possible follow up with Neurology for reevaluation of the need for Pl  . Hyperlipidemia   . Hypertension   . Keloid 08/01/2018  . Memory disorder 12/20/2016  . Mitral valve prolapse 10/27/1999   Subsequent 2D echo show normal mitral  valve  . Osteoarthritis of hip    bilateral hips  . Ovarian cancer ([place]) 1972   S/P oophorectomy  . Psychosis ([place])   . Recurrent boils    History of MRSA skin infections with abscess  . Sensorineural hearing loss of both ears   . Subdural hematoma ([place]) 01/26/2017   bilateral  . Uncomplicated opioid dependence ([place]) 01/31/2018    Past Surgical History:  Procedure Laterality Date  . ABDOMINAL HYSTERECTOMY     1972  . BURR HOLE Bilateral 02/12/2017   Procedure: Haskell Flirt;  Surgeon: Earnie Larsson, MD;  Location: [facility];  Service: Neurosurgery;  Laterality: Bilateral;  . CERVICAL DISCECTOMY  7/07   C5-C6  . JOINT REPLACEMENT     bilateral hip replacement  . LUMBAR DISC SURGERY     L5-S1 7/07  . LUMBAR LAMINECTOMY/DECOMPRESSION MICRODISCECTOMY N/A 02/02/2016   Procedure: L4-5 Decompression;  Surgeon: Marybelle Killings, MD;  Location: [facility];  Service: Orthopedics;  Laterality: N/A;  . OOPHORECTOMY     1972 for ovarian cancer     Home Medications:  Prior to Admission medications   Medication Sig Start Date End Date Taking? Authorizing Provider  acetaminophen (TYLENOL) 500 MG tablet Take 500 mg by mouth every 8 (eight) hours as needed.    [provider]  allopurinol (ZYLOPRIM) 300 MG tablet TAKE 1 TABLET BY MOUTH  DAILY 07/29/20   Vivi Barrack, MD  amLODipine (NORVASC) 10 MG tablet Take 1 tablet (10 mg total) by mouth daily. 12/19/19   Vivi Barrack, MD  ascorbic acid (VITAMIN C) 500 MG tablet Take 500 mg by mouth daily.     [provider]  atorvastatin (LIPITOR) 40 MG tablet TAKE 1 TABLET BY MOUTH  DAILY 07/29/20   Vivi Barrack, MD  azithromycin (ZITHROMAX) 250 MG tablet Take 2 tabs the first day and then 1 tab daily until you run out. 09/24/20   Shelda Pal, DO  buPROPion (WELLBUTRIN XL) 300 MG 24 hr tablet Take 1 tablet (300 mg total) by mouth every morning. 08/31/20   Arfeen, Arlyce Harman, MD  diclofenac sodium (VOLTAREN) 1 % GEL APPLY 4 GRAMS TOPICALLY 4  TIMES  DAILY 09/09/19   Vivi Barrack, MD  fluconazole (DIFLUCAN) 150 MG tablet Take 1 tab, repeat in 48 hours if no improvement. 09/22/20   Shelda Pal, DO  fluticasone (FLONASE) 50 MCG/ACT nasal spray USE 2 SPRAYS IN BOTH  NOSTRILS DAILY 05/28/20   Vivi Barrack, MD  furosemide (LASIX) 20 MG tablet TAKE 1 TABLET BY MOUTH  DAILY AS NEEDED FOR  SWELLING AND WEIGHT GAIN 08/04/20   Vivi Barrack, MD  haloperidol (HALDOL) 0.5 MG tablet Take 1 tablet at bedtime and second tablet if needed for agitation. 08/31/20   Arfeen, Arlyce Harman, MD  hydrOXYzine (VISTARIL) 25 MG capsule TAKE 1 CAPSULE BY MOUTH AT  BEDTIME 08/04/20   Vivi Barrack, MD  losartan (COZAAR) 100 MG tablet Take 1 tablet (100 mg total) by mouth daily. 07/29/20   Vivi Barrack, MD  pantoprazole (PROTONIX) 40 MG tablet Take 1 tablet once daily. 11/06/19   Vivi Barrack, MD  potassium  chloride (MICRO-K) 10 MEQ CR capsule Take 10 mEq by mouth daily. 05/28/20   [provider]  predniSONE (DELTASONE) 20 MG tablet Take 2 tablets (40 mg total) by mouth daily with breakfast for 5 days. 09/22/20 09/27/20  Shelda Pal, DO  traMADol (ULTRAM) 50 MG tablet TAKE 1 TABLET BY MOUTH  TWICE DAILY AS NEEDED 05/28/20   Vivi Barrack, MD    Inpatient Medications: Scheduled Meds: . AeroChamber Plus Flo-Vu Medium  1 each Other Once  . albuterol  4 puff Inhalation Once  . ipratropium  4 puff Inhalation Once   Continuous Infusions:  PRN Meds:   Allergies:    Allergies  Allergen Reactions  . Aspirin Other (See Comments)    Bleeding from vagina   . Sulfamethoxazole-Trimethoprim Itching    Social History:   Social History   Socioeconomic History  . Marital status: Widowed    Spouse name: Not on file  . Number of children: 3  . Years of education: 32  . Highest education level: Not on file  Occupational History  . Occupation: Retired  Tobacco Use  . Smoking status: Former Smoker    Packs/day: 0.10    Years: 35.00     Pack years: 3.50    Types: Cigarettes    Quit date: 04/08/2013    Years since quitting: 7.4  . Smokeless tobacco: Never Used  . Tobacco comment: smoked a few when her son died in 01/30/2013   Vaping Use  . Vaping Use: Never used  Substance and Sexual Activity  . Alcohol use: No    Alcohol/week: 0.0 standard drinks  . Drug use: No  . Sexual activity: Never  Other Topics Concern  . Not on file  Social History Narrative   Lives with daughter in [place]. Daughter is primary caretaker, surrogate decision-maker, and arranges for [facility] aide and other caregivers to supervise Ms. Giambra at home. Ambulatory   Social Determinants of Health   Financial Resource Strain:   . Difficulty of Paying Living Expenses: Not on file  Food Insecurity:   . Worried About Charity fundraiser in the Last Year: Not on file  . Ran Out of Food in the Last Year: Not on file  Transportation Needs: No Transportation Needs  . Lack of Transportation (Medical): No  . Lack of Transportation (Non-Medical): No  Physical Activity:   . Days of Exercise per Week: Not on file  . Minutes of Exercise per Session: Not on file  Stress:   . Feeling of Stress : Not on file  Social Connections:   . Frequency of Communication with Friends and Family: Not on file  . Frequency of Social Gatherings with Friends and Family: Not on file  . Attends Religious Services: Not on file  . Active Member of Clubs or Organizations: Not on file  . Attends Archivist Meetings: Not on file  . Marital Status: Not on file  Intimate Partner Violence:   . Fear of Current or Ex-Partner: Not on file  . Emotionally Abused: Not on file  . Physically Abused: Not on file  . Sexually Abused: Not on file    Family History:    Family History  Problem Relation Age of Onset  . Diabetes Mother   . Heart disease Mother   . Hearing loss Mother   . Stroke Mother   . Post-traumatic stress disorder Father   . Schizophrenia Maternal  Grandmother   . Diabetes Maternal Grandfather   .  Other Brother        brother died in mental health [facility]  . Kidney cancer Neg Hx   . Bladder Cancer Neg Hx      ROS:  Please see the history of present illness.  Review of Systems  Constitutional: Positive for diaphoresis and malaise/fatigue. Negative for chills and fever.  HENT: Positive for congestion and hearing loss.   Eyes: Negative for blurred vision.  Respiratory: Positive for cough and shortness of breath.   Cardiovascular: Positive for chest pain and leg swelling.  Gastrointestinal: Negative for abdominal pain and blood in stool.  Genitourinary: Negative for hematuria.  Musculoskeletal: Positive for myalgias.  Neurological: Negative for loss of consciousness.  Endo/Heme/Allergies: Negative for polydipsia.  Psychiatric/Behavioral: Positive for hallucinations.   Physical Exam/Data:   Vitals:   09/22/20 1657 09/22/20 1715 09/22/20 1800  BP: (!) 129/52 114/82 135/68  Pulse: 81 87 76  Resp: (!) 22 19 17   Temp: 98.4 F (36.9 C)    TempSrc: Oral    SpO2: 98% 96% 97%   No intake or output data in the 24 hours ending 09/22/20 1802 Last 3 Weights 09/22/2020 08/30/2020 04/12/2020  Weight (lbs) 284 lb 283 lb 6.4 oz 266 lb 6.1 oz  Weight (kg) 128.822 kg 128.549 kg 120.829 kg  Some encounter information is confidential and restricted. Go to Review Flowsheets activity to see all data.     There is no height or weight on file to calculate BMI.  GEN:  Visibly dyspneic and tachypneic on exam HEENT: Normal NECK: +JVD CARDIAC: RRR, no murmurs appreciated RESPIRATORY:  Diffuse expiratory wheezing and rhonchi on exam. Very tachypneic ABDOMEN: Obese, soft MUSCULOSKELETAL:  Warm, 2+ pitting edema to the thigh SKIN: Warm and dry NEUROLOGIC:  Alert, minimally interactive, will answer yes and no to question PSYCHIATRIC:  Minimally interactive. Often does not respond to questions (per daughter, this is normal for her and she has  underlying schizophrenia)  EKG:  The EKG was personally reviewed and demonstrates:  NSR with HR 95 Telemetry:  Telemetry was personally reviewed and demonstrates: NSR  Relevant CV Studies: TTE 07/26/17: Study Conclusions   - Left ventricle: The cavity size was normal. Wall thickness was  normal. Systolic function was vigorous. The estimated ejection  fraction was in the range of 65% to 70%. Wall motion was normal;  there were no regional wall motion abnormalities. Doppler  parameters are consistent with abnormal left ventricular  relaxation (grade 1 diastolic dysfunction).  - Aortic valve: Trileaflet; mildly thickened, mildly calcified  leaflets.  - Left atrium: The atrium was mildly dilated.  - Tricuspid valve: There was trivial regurgitation.   Left ventricle: The cavity size was normal. Wall thickness was  normal. Systolic function was vigorous. The estimated ejection  fraction was in the range of 65% to 70%. Wall motion was normal;  there were no regional wall motion abnormalities. Doppler  parameters are consistent with abnormal left ventricular relaxation  (grade 1 diastolic dysfunction).   -------------------------------------------------------------------  Aortic valve:  Trileaflet; mildly thickened, mildly calcified  leaflets. Mobility was not restricted. Doppler: Transvalvular  velocity was within the normal range. There was no stenosis. There  was no regurgitation.  Peak velocity ratio of LVOT to aortic  valve: 0.61. Valve area (Vmax): 2.54 cm^2. Indexed valve area  (Vmax): 1.19 cm^2/m^2.  Peak gradient (S): 13 mm Hg.   -------------------------------------------------------------------  Aorta: Aortic root: The aortic root was normal in size.   -------------------------------------------------------------------  Mitral valve:  Structurally normal valve.  Mobility was not  restricted. Doppler: Transvalvular velocity was within the normal    range. There was no evidence for stenosis. There was no  regurgitation.  Peak gradient (D): 2 mm Hg.   -------------------------------------------------------------------  Left atrium: The atrium was mildly dilated.   -------------------------------------------------------------------  Right ventricle: The cavity size was normal. Wall thickness was  normal. Systolic function was normal.   -------------------------------------------------------------------  Pulmonic valve:  Poorly visualized. Structurally normal valve.  Cusp separation was normal. Doppler: Transvalvular velocity was  within the normal range. There was no evidence for stenosis. There  was no regurgitation.   -------------------------------------------------------------------  Tricuspid valve:  Structurally normal valve.  Doppler:  Transvalvular velocity was within the normal range. There was  trivial regurgitation.   -------------------------------------------------------------------  Pulmonary artery:  The main pulmonary artery was normal-sized.  Systolic pressure was within the normal range.   -------------------------------------------------------------------  Right atrium: The atrium was normal in size.   -------------------------------------------------------------------  Pericardium: There was no pericardial effusion.   Lower extremity doppler ultrasound 06/2017: FINDINGS: RIGHT LOWER EXTREMITY  Common Femoral Vein: No evidence of thrombus. Normal compressibility, respiratory phasicity and response to augmentation.  Saphenofemoral Junction: No evidence of thrombus. Normal compressibility and flow on color Doppler imaging.  Profunda Femoral Vein: No evidence of thrombus. Normal compressibility and flow on color Doppler imaging.  Femoral Vein: No evidence of thrombus. Normal compressibility, respiratory phasicity and response to augmentation.  Popliteal Vein: No evidence of  thrombus. Normal compressibility, respiratory phasicity and response to augmentation.  Calf Veins: No evidence of thrombus. Normal compressibility and flow on color Doppler imaging.  Superficial Great Saphenous Vein: No evidence of thrombus. Normal compressibility and flow on color Doppler imaging.  Venous Reflux: None.  Other Findings: No evidence of superficial thrombophlebitis or abnormal fluid collection.  LEFT LOWER EXTREMITY  Common Femoral Vein: No evidence of thrombus. Normal compressibility, respiratory phasicity and response to augmentation.  Saphenofemoral Junction: No evidence of thrombus. Normal compressibility and flow on color Doppler imaging.  Profunda Femoral Vein: No evidence of thrombus. Normal compressibility and flow on color Doppler imaging.  Femoral Vein: No evidence of thrombus. Normal compressibility, respiratory phasicity and response to augmentation.  Popliteal Vein: No evidence of thrombus. Normal compressibility, respiratory phasicity and response to augmentation.  Calf Veins: No evidence of thrombus. Normal compressibility and flow on color Doppler imaging.  Superficial Great Saphenous Vein: No evidence of thrombus. Normal compressibility and flow on color Doppler imaging.  Venous Reflux: None.  Other Findings: No evidence of superficial thrombophlebitis or abnormal fluid collection.  IMPRESSION: No evidence of DVT within either lower extremity.  Laboratory Data:  High Sensitivity Troponin:  No results for input(s): TROPONINIHS in the last 720 hours.   ChemistryNo results for input(s): NA, K, CL, CO2, GLUCOSE, BUN, CREATININE, CALCIUM, GFRNONAA, GFRAA, ANIONGAP in the last 168 hours.  No results for input(s): PROT, ALBUMIN, AST, ALT, ALKPHOS, BILITOT in the last 168 hours. HematologyNo results for input(s): WBC, RBC, HGB, HCT, MCV, MCH, MCHC, RDW, PLT in the last 168 hours. BNPNo results for input(s): BNP, PROBNP in the  last 168 hours.  DDimer No results for input(s): DDIMER in the last 168 hours.   Radiology/Studies:  DG Chest Port 1 View  Result Date: 09/22/2020 CLINICAL DATA:  79 year old female with shortness of breath. EXAM: PORTABLE CHEST 1 VIEW COMPARISON:  Chest radiograph dated 12/19/2019. FINDINGS: Bibasilar streaky densities may represent atelectasis or infiltrate. No pleural effusion pneumothorax. Mild cardiomegaly. No acute osseous pathology. IMPRESSION: Bibasilar streaky densities  may represent atelectasis or infiltrate. Electronically Signed   By: Anner Crete M.D.   On: 09/22/2020 17:43     Assessment and Plan:   #Respiratory distress #Hypoxia: Patient visibly dyspneic on arrival to clinic dropping sats to low 80s with ambulation requiring 3L supplemental O2. Likely multifactorial in the setting of suspected volume overload, possible URI, and ? Underlying asthma/COPD.  -Continue supplemental O2 -Check TTE -Obtain CXR -Diurese with lasix 40mg  IV BID; up-titrate as needed -?underlying asthma/COPD--management per primary team  #Acute on chronic heart failure Acutely decompensated and grossly overloaded on exam. NYHA class III symptoms and severely limited in terms of activity. Has not had cardiology evaluation in the past. Last TTE in our system with LVEF 65%, G1DD, no significant valvular disease. Will need thorough cardiac evaluation as in-patient. -Diuresis with lasix 40mg  IV BID on arrival to ED; titrate as needed -Check TTE -BMET, trop, BNP -Likely needs ischemic work-up -Monitor I/Os and daily weights -Low Na diet -Will need regular CV follow-up on discharge  #Hypertension: -Continue home losartan pending renal function -Continue home amlopdipine  #HLD: -Check lipid panel -Continue statin  #TIA -ASA 81mg  daily -Lipitor 40mg   #Schizophrenia #Dementia: -Management per primary team    New York Heart Association (NYHA) Functional Class NYHA Class III       For questions or updates, please contact [facility] Please consult [email or web address] for contact info under    Signed, Freada Bergeron, MD  09/22/2020 6:02 PM

## 2020-09-22 NOTE — ED Provider Notes (Signed)
[facility] EMERGENCY DEPARTMENT Provider Note   CSN: [id number] Arrival date & time: 09/22/20  1647     History Chief Complaint  Patient presents with  . Congestive Heart Failure    [name] is a 79 y.o. female.  Pt is a 79y/o female with hx of dementia, TIA, HTN, HLD, chronic venous insufficiency, CKD, CHF and history of ovarian cancer s/p oophrectomy presenting today by ambulance from cardiology office with hypoxia and weight gain.  Today was the first time cardiology was seeing the patient.  It was reported for the last several months patient has had a weight gain from 230 pounds to 280 pounds.  Her Lasix have been gradually increased without significant improvement and then at the beginning of December her Lasix was discontinued because there was a bump in her creatinine.  Patient was following up with cardiology today for further evaluation and patient upon arrival there was tachypneic, hypoxic to the 80s which did improve with rest and 3 L of oxygen.  Daughter provided the history while at the office and reported that patient has had severe exertional dyspnea to where she can only take a few steps before she is stopping and gasping for air.  Patient does have a history of tobacco use in the past but has never been diagnosed with COPD.  Patient does not appear to wear chronic oxygen at home.  Patient also had a video visit with her PCP today and at that time was describing URI type symptoms.  Patient's daughter is currently not present and patient does not give much history.  He did prescribe prednisone and azithromycin.  She has not taken any of these medications yet.  She denies any chest pain at this time.  She denies feeling short of breath.  She does complain of her legs being swollen.  The history is provided by the EMS personnel and medical records. The history is limited by the absence of a caregiver.  Congestive Heart Failure       Past Medical  History:  Diagnosis Date  . Chronic venous insufficiency   . Colon, diverticulosis Sept. 2011   outpatient colonoscopy by Dr. Cristina Gong.  Need record  . DDD (degenerative disc disease), lumbosacral   . Depression   . DJD (degenerative joint disease), multiple sites    Low back pain worst  . GERD (gastroesophageal reflux disease)   . Gout   . Gout    Uric Acid level 4.2 on 300 allopurinol  . History of cervical cancer 1972  . Hx-TIA (transient ischemic attack) 05/13/2001   Right facial numbness  . Hx-TIA (transient ischemic attack) 05/13/2001   Looking back in Epic, the patient had a hospitalization in July 2002 for evaluation of questionable TIA (s/s of right facial numbness associated with mild blurred vision in her right eye, diaphoresis, and mild confusion) with negative CT head and negative MRI/MRA and was started on Plavix after being heparinized. She was to have possible follow up with Neurology for reevaluation of the need for Pl  . Hyperlipidemia   . Hypertension   . Keloid 08/01/2018  . Memory disorder 12/20/2016  . Mitral valve prolapse 10/27/1999   Subsequent 2D echo show normal mitral valve  . Osteoarthritis of hip    bilateral hips  . Ovarian cancer ([place]) 1972   S/P oophorectomy  . Psychosis ([place])   . Recurrent boils    History of MRSA skin infections with abscess  . Sensorineural hearing loss of  both ears   . Subdural hematoma ([place]) 01/26/2017   bilateral  . Uncomplicated opioid dependence ([place]) 01/31/2018    Patient Active Problem List   Diagnosis Date Noted  . Debility 11/06/2019  . Venous stasis of lower extremity 10/14/2019  . Hyperglycemia 05/06/2018  . Constipation 04/30/2018  . Osteoarthritis 01/31/2018  . Lower extremity edema 07/31/2017  . History of bilateral hip replacements   . Memory disorder 12/20/2016  . History of lumbar laminectomy for spinal cord decompression 02/02/2016  . Atopic dermatitis 07/09/2015  . Reactive airway disease 08/11/2014  .  History of cancer 08/11/2014  . OSA (obstructive sleep apnea) 08/11/2014  . Chronic kidney disease (CKD), stage III (moderate) ([place]) 10/04/2012  . Schizophrenia, chronic condition ([place]) 01/20/2012  . Postmenopausal atrophic vaginitis 11/01/2010  . Chronic diastolic heart failure ([place]) 05/06/2010  . Allergic rhinitis 10/14/2009  . Obesity, Class II, BMI 35-39.9, with comorbidity 05/14/2009  . Chronic prescription opiate use 04/29/2008  . Normocytic anemia 09/25/2007  . Hyperlipidemia 09/03/2006  . Gout 09/03/2006  . Essential hypertension 09/03/2006  . GERD 09/03/2006    Past Surgical History:  Procedure Laterality Date  . ABDOMINAL HYSTERECTOMY     1972  . BURR HOLE Bilateral 02/12/2017   Procedure: Haskell Flirt;  Surgeon: Earnie Larsson, MD;  Location: [facility];  Service: Neurosurgery;  Laterality: Bilateral;  . CERVICAL DISCECTOMY  7/07   C5-C6  . JOINT REPLACEMENT     bilateral hip replacement  . LUMBAR DISC SURGERY     L5-S1 7/07  . LUMBAR LAMINECTOMY/DECOMPRESSION MICRODISCECTOMY N/A 02/02/2016   Procedure: L4-5 Decompression;  Surgeon: Marybelle Killings, MD;  Location: [facility];  Service: Orthopedics;  Laterality: N/A;  . [place] for ovarian cancer     OB History   No obstetric history on file.     Family History  Problem Relation Age of Onset  . Diabetes Mother   . Heart disease Mother   . Hearing loss Mother   . Stroke Mother   . Post-traumatic stress disorder Father   . Schizophrenia Maternal Grandmother   . Diabetes Maternal Grandfather   . Other Brother        brother died in mental health [facility]  . Kidney cancer Neg Hx   . Bladder Cancer Neg Hx     Social History   Tobacco Use  . Smoking status: Former Smoker    Packs/day: 0.10    Years: 35.00    Pack years: 3.50    Types: Cigarettes    Quit date: 04/08/2013    Years since quitting: 7.4  . Smokeless tobacco: Never Used  . Tobacco comment: smoked a few when her son died in Feb 02, 2013   Vaping Use  .  Vaping Use: Never used  Substance Use Topics  . Alcohol use: No    Alcohol/week: 0.0 standard drinks  . Drug use: No    Home Medications Prior to Admission medications   Medication Sig Start Date End Date Taking? Authorizing Provider  acetaminophen (TYLENOL) 500 MG tablet Take 500 mg by mouth every 8 (eight) hours as needed.    [provider]  allopurinol (ZYLOPRIM) 300 MG tablet TAKE 1 TABLET BY MOUTH  DAILY 07/29/20   Vivi Barrack, MD  amLODipine (NORVASC) 10 MG tablet Take 1 tablet (10 mg total) by mouth daily. 12/19/19   Vivi Barrack, MD  ascorbic acid (VITAMIN C) 500 MG tablet Take 500 mg by mouth daily.  [provider]  atorvastatin (LIPITOR) 40 MG tablet TAKE 1 TABLET BY MOUTH  DAILY 07/29/20   Vivi Barrack, MD  azithromycin (ZITHROMAX) 250 MG tablet Take 2 tabs the first day and then 1 tab daily until you run out. 09/24/20   Shelda Pal, DO  buPROPion (WELLBUTRIN XL) 300 MG 24 hr tablet Take 1 tablet (300 mg total) by mouth every morning. 08/31/20   Arfeen, Arlyce Harman, MD  diclofenac sodium (VOLTAREN) 1 % GEL APPLY 4 GRAMS TOPICALLY 4  TIMES DAILY 09/09/19   Vivi Barrack, MD  fluconazole (DIFLUCAN) 150 MG tablet Take 1 tab, repeat in 48 hours if no improvement. 09/22/20   Shelda Pal, DO  fluticasone (FLONASE) 50 MCG/ACT nasal spray USE 2 SPRAYS IN BOTH  NOSTRILS DAILY 05/28/20   Vivi Barrack, MD  furosemide (LASIX) 20 MG tablet TAKE 1 TABLET BY MOUTH  DAILY AS NEEDED FOR  SWELLING AND WEIGHT GAIN 08/04/20   Vivi Barrack, MD  haloperidol (HALDOL) 0.5 MG tablet Take 1 tablet at bedtime and second tablet if needed for agitation. 08/31/20   Arfeen, Arlyce Harman, MD  hydrOXYzine (VISTARIL) 25 MG capsule TAKE 1 CAPSULE BY MOUTH AT  BEDTIME 08/04/20   Vivi Barrack, MD  losartan (COZAAR) 100 MG tablet Take 1 tablet (100 mg total) by mouth daily. 07/29/20   Vivi Barrack, MD  pantoprazole (PROTONIX) 40 MG tablet Take 1 tablet once daily.  11/06/19   Vivi Barrack, MD  predniSONE (DELTASONE) 20 MG tablet Take 2 tablets (40 mg total) by mouth daily with breakfast for 5 days. 09/22/20 09/27/20  Shelda Pal, DO  traMADol (ULTRAM) 50 MG tablet TAKE 1 TABLET BY MOUTH  TWICE DAILY AS NEEDED 05/28/20   Vivi Barrack, MD    Allergies    Aspirin and Sulfamethoxazole-trimethoprim  Review of Systems   Review of Systems  Unable to perform ROS: Dementia    Physical Exam Updated Vital Signs BP (!) 129/52 (BP Location: Right Arm)   Pulse 81   Temp 98.4 F (36.9 C) (Oral)   Resp (!) 22   SpO2 98%   Physical Exam Vitals and nursing note reviewed.  Constitutional:      General: She is not in acute distress.    Appearance: Normal appearance. She is well-developed. She is obese.  HENT:     Head: Normocephalic and atraumatic.     Mouth/Throat:     Mouth: Mucous membranes are moist.  Eyes:     Pupils: Pupils are equal, round, and reactive to light.  Cardiovascular:     Rate and Rhythm: Normal rate and regular rhythm.     Pulses: Normal pulses.     Heart sounds: Normal heart sounds. No murmur heard.  No friction rub.  Pulmonary:     Effort: Pulmonary effort is normal. Tachypnea present.     Breath sounds: Wheezing present. No rales.  Abdominal:     General: Bowel sounds are normal. There is no distension.     Palpations: Abdomen is soft.     Tenderness: There is no abdominal tenderness. There is no guarding or rebound.  Musculoskeletal:        General: No tenderness. Normal range of motion.     Cervical back: Normal range of motion and neck supple.     Right lower leg: Edema present.     Left lower leg: Edema present.     Comments: No edema  Skin:  General: Skin is warm and dry.     Findings: No rash.  Neurological:     Mental Status: She is alert. Mental status is at baseline.     Cranial Nerves: No cranial nerve deficit.     Sensory: No sensory deficit.     Motor: No weakness.     Comments: Oriented  to self  Psychiatric:     Comments: Calm and cooperative.  Tangential and disorganized thinking     ED Results / Procedures / Treatments   Labs (all labs ordered are listed, but only abnormal results are displayed) Labs Reviewed  CBC WITH DIFFERENTIAL/PLATELET - Abnormal; Notable for the following components:      Result Value   Hemoglobin 11.8 (*)    RDW 15.9 (*)    All other components within normal limits  COMPREHENSIVE METABOLIC PANEL - Abnormal; Notable for the following components:   Glucose, Bld 126 (*)    Creatinine, Ser 1.61 (*)    GFR, Estimated 32 (*)    All other components within normal limits  RESP PANEL BY RT-PCR (FLU A&B, COVID) ARPGX2  BRAIN NATRIURETIC PEPTIDE  TROPONIN I (HIGH SENSITIVITY)  TROPONIN I (HIGH SENSITIVITY)    EKG EKG Interpretation  Date/Time:  Wednesday September 22 2020 16:56:51 EST Ventricular Rate:  83 PR Interval:    QRS Duration: 81 QT Interval:  363 QTC Calculation: 427 R Axis:   11 Text Interpretation: Sinus rhythm Abnormal R-wave progression, early transition No significant change since last tracing Confirmed by Blanchie Dessert ([id number]) on 09/22/2020 4:58:45 PM   Radiology DG Chest Port 1 View  Result Date: 09/22/2020 CLINICAL DATA:  79 year old female with shortness of breath. EXAM: PORTABLE CHEST 1 VIEW COMPARISON:  Chest radiograph dated 12/19/2019. FINDINGS: Bibasilar streaky densities may represent atelectasis or infiltrate. No pleural effusion pneumothorax. Mild cardiomegaly. No acute osseous pathology. IMPRESSION: Bibasilar streaky densities may represent atelectasis or infiltrate. Electronically Signed   By: Anner Crete M.D.   On: 09/22/2020 17:43    Procedures Procedures (including critical care time)  Medications Ordered in ED Medications  albuterol (VENTOLIN HFA) 108 (90 Base) MCG/ACT inhaler 4 puff (has no administration in time range)  AeroChamber Plus Flo-Vu Medium MISC 1 each (has no administration in  time range)    ED Course  I have reviewed the triage vital signs and the nursing notes.  Pertinent labs & imaging results that were available during my care of the patient were reviewed by me and considered in my medical decision making (see chart for details).    MDM Rules/Calculators/A&P                          Pleasant 79 year old female presenting today via ambulance after seeing cardiology for hypoxia and concern for fluid overload.  Patient has had a 20 pound weight gain over the last few months and had adjustment of her Lasix due to an elevated creatinine.  Patient has had severe DOE and was hypoxic upon arrival to cardiology today in the 80s requiring 3 L of oxygen.  Patient also has a history of smoking but never been diagnosed with COPD.  Patient is wheezing on exam and has evidence of fluid overload.  Unclear if wheezing is related to all fluid or patient may also be having a COPD exacerbation.  She is currently on 3 L with mild tachypnea but satting 98%.  She is in no acute distress at this time.  Will check  labs, Covid and chest x-ray.  Patient given albuterol/atrovent to see if this improves wheezing.  Patient was seen by PCP today as well with complaint of URI type symptoms and was given a prescription for prednisone and azithromycin but she did not start taking this medications yet.  7:48 PM Patient's Covid test is negative, CBC within normal limits, CMP with mild worsening renal function of 1.6, initial troponin of 10 and BNP of 35.  Chest x-ray showing atelectasis or infiltrate.  After albuterol and Atrovent patient's wheezing is mildly improved.  She is still requiring oxygen.  Will give Solu-Medrol, another round of inhalers and will also give IV Lasix.  Will admit for new echo, diuresis as well as ongoing respiratory treatments.  Patient has been afebrile and lower suspicion for pneumonia at this time.  MDM Number of Diagnoses or Management Options   Amount and/or Complexity  of Data Reviewed Clinical lab tests: ordered and reviewed Tests in the radiology section of CPT: ordered and reviewed Tests in the medicine section of CPT: ordered and reviewed Decide to obtain previous medical records or to obtain history from someone other than the patient: yes Obtain history from someone other than the patient: yes Review and summarize past medical records: yes Discuss the patient with other providers: yes Independent visualization of images, tracings, or specimens: yes  Risk of Complications, Morbidity, and/or Mortality Presenting problems: moderate Diagnostic procedures: low Management options: moderate  Critical Care Total time providing critical care: 30-74 minutes  Patient Progress Patient progress: improved  CRITICAL CARE Performed by: Vedh Ptacek Total critical care time: 30 minutes Critical care time was exclusive of separately billable procedures and treating other patients. Critical care was necessary to treat or prevent imminent or life-threatening deterioration. Critical care was time spent personally by me on the following activities: development of treatment plan with patient and/or surrogate as well as nursing, discussions with consultants, evaluation of patient's response to treatment, examination of patient, obtaining history from patient or surrogate, ordering and performing treatments and interventions, ordering and review of laboratory studies, ordering and review of radiographic studies, pulse oximetry and re-evaluation of patient's condition.  Final Clinical Impression(s) / ED Diagnoses Final diagnoses:  Acute on chronic congestive heart failure, unspecified heart failure type ([place])  Chronic obstructive pulmonary disease with acute exacerbation [facility])    Rx / DC Orders ED Discharge Orders    None       Blanchie Dessert, MD 09/22/20 1952

## 2020-09-22 NOTE — ED Triage Notes (Signed)
Pt arrives via EMS from Dr. Gabriel Carina with complaints of SOB. Pt SpO2 80's. Placed on 3 liters [place]. Denies CP. Recently taken off lasix d/t kidney function. Gained 60 lbs in last month. Pitted edema noted in all extremities.   108/84 HR 88 97% 3 liters RR 24 98.2 T

## 2020-09-22 NOTE — Progress Notes (Signed)
Chief Complaint  Patient presents with   Nasal Congestion   Cough    [name] here for URI complaints. Due to COVID-19 pandemic, we are interacting via telephone. I verified patient's ID using 2 identifiers. Patient agreed to proceed with visit via this method. Patient is at home, I am at office. Patient, [name] (her daughter) and I are present for visit.   Duration: 5 days  Associated symptoms: sinus congestion, wheezing and cough Denies: sinus pain, rhinorrhea, itchy watery eyes, ear pain, ear drainage, sore throat, shortness of breath, myalgia and fevers Treatment to date: lemon, tea, robitussin, cough drops Sick contacts: No  Seems to get when weather changes.   Past Medical History:  Diagnosis Date   Chronic venous insufficiency    Colon, diverticulosis Sept. 2011   outpatient colonoscopy by Dr. Cristina Gong.  Need record   DDD (degenerative disc disease), lumbosacral    Depression    DJD (degenerative joint disease), multiple sites    Low back pain worst   GERD (gastroesophageal reflux disease)    Gout    Gout    Uric Acid level 4.2 on 300 allopurinol   History of cervical cancer 1972   Hx-TIA (transient ischemic attack) 05/13/2001   Right facial numbness   Hx-TIA (transient ischemic attack) 05/13/2001   Looking back in Epic, the patient had a hospitalization in July 2002 for evaluation of questionable TIA (s/s of right facial numbness associated with mild blurred vision in her right eye, diaphoresis, and mild confusion) with negative CT head and negative MRI/MRA and was started on Plavix after being heparinized. She was to have possible follow up with Neurology for reevaluation of the need for Pl   Hyperlipidemia    Hypertension    Keloid 08/01/2018   Memory disorder 12/20/2016   Mitral valve prolapse 10/27/1999   Subsequent 2D echo show normal mitral valve   Osteoarthritis of hip    bilateral hips   Ovarian cancer ([place]) 1972   S/P oophorectomy    Psychosis ([place])    Recurrent boils    History of MRSA skin infections with abscess   Sensorineural hearing loss of both ears    Subdural hematoma ([place]) 01/26/2017   bilateral   Uncomplicated opioid dependence ([place]) 01/31/2018    Temp 98.4 F (36.9 C) (Oral)  No conversational dyspnea Limited judgment and insight Nml affect and mood  Wheezy bronchitis - Plan: predniSONE (DELTASONE) 20 MG tablet, azithromycin (ZITHROMAX) 250 MG tablet, fluconazole (DIFLUCAN) 150 MG tablet  Pred burst 40 mg/d for 5 d. If no improvement over next couple days, will start Zpak, Diflucan if she develops yeast infection.  Continue to push fluids, practice good hand hygiene, cover mouth when coughing. F/u prn. If starting to experience fevers, shaking, or shortness of breath, seek immediate care. Pt and daughter voiced understanding and agreement to the plan.  [place], DO 09/22/20 3:19 PM

## 2020-09-23 ENCOUNTER — Inpatient Hospital Stay (HOSPITAL_COMMUNITY): Payer: Medicare Other

## 2020-09-23 DIAGNOSIS — I5031 Acute diastolic (congestive) heart failure: Secondary | ICD-10-CM

## 2020-09-23 DIAGNOSIS — I10 Essential (primary) hypertension: Secondary | ICD-10-CM

## 2020-09-23 DIAGNOSIS — I5033 Acute on chronic diastolic (congestive) heart failure: Secondary | ICD-10-CM | POA: Diagnosis not present

## 2020-09-23 LAB — ECHOCARDIOGRAM COMPLETE
Area-P 1/2: 5.31 cm2
Height: 63.5 in
S' Lateral: 2.3 cm
Single Plane A4C EF: 55 %
Weight: 4426.84 oz

## 2020-09-23 LAB — RENAL FUNCTION PANEL
Albumin: 4.2 g/dL (ref 3.6–5.1)
BUN/Creatinine Ratio: 14 (calc) (ref 6–22)
BUN: 20 mg/dL (ref 7–25)
CO2: 21 mmol/L (ref 20–32)
Calcium: 9.6 mg/dL (ref 8.6–10.4)
Chloride: 107 mmol/L (ref 98–110)
Creat: 1.43 mg/dL — ABNORMAL HIGH (ref 0.60–0.93)
Glucose, Bld: 106 mg/dL — ABNORMAL HIGH (ref 65–99)
Phosphorus: 4.5 mg/dL — ABNORMAL HIGH (ref 2.1–4.3)
Potassium: 4.6 mmol/L (ref 3.5–5.3)
Sodium: 142 mmol/L (ref 135–146)

## 2020-09-23 LAB — CBC
HCT: 33.1 % — ABNORMAL LOW (ref 36.0–46.0)
Hemoglobin: 11 g/dL — ABNORMAL LOW (ref 12.0–15.0)
MCH: 31.2 pg (ref 26.0–34.0)
MCHC: 33.2 g/dL (ref 30.0–36.0)
MCV: 93.8 fL (ref 80.0–100.0)
Platelets: 247 10*3/uL (ref 150–400)
RBC: 3.53 MIL/uL — ABNORMAL LOW (ref 3.87–5.11)
RDW: 15.8 % — ABNORMAL HIGH (ref 11.5–15.5)
WBC: 8.1 10*3/uL (ref 4.0–10.5)
nRBC: 0 % (ref 0.0–0.2)

## 2020-09-23 LAB — MRSA PCR SCREENING: MRSA by PCR: NEGATIVE

## 2020-09-23 LAB — COMPREHENSIVE METABOLIC PANEL
ALT: 21 U/L (ref 0–44)
AST: 21 U/L (ref 15–41)
Albumin: 3.3 g/dL — ABNORMAL LOW (ref 3.5–5.0)
Alkaline Phosphatase: 84 U/L (ref 38–126)
Anion gap: 10 (ref 5–15)
BUN: 19 mg/dL (ref 8–23)
CO2: 23 mmol/L (ref 22–32)
Calcium: 9.2 mg/dL (ref 8.9–10.3)
Chloride: 108 mmol/L (ref 98–111)
Creatinine, Ser: 1.55 mg/dL — ABNORMAL HIGH (ref 0.44–1.00)
GFR, Estimated: 34 mL/min — ABNORMAL LOW (ref >60)
Glucose, Bld: 208 mg/dL — ABNORMAL HIGH (ref 70–99)
Potassium: 4.3 mmol/L (ref 3.5–5.1)
Sodium: 141 mmol/L (ref 135–145)
Total Bilirubin: 0.4 mg/dL (ref 0.3–1.2)
Total Protein: 6.7 g/dL (ref 6.5–8.1)

## 2020-09-23 LAB — STREP PNEUMONIAE URINARY ANTIGEN: Strep Pneumo Urinary Antigen: NEGATIVE

## 2020-09-23 LAB — HIV ANTIBODY (ROUTINE TESTING W REFLEX): HIV Screen 4th Generation wRfx: NONREACTIVE

## 2020-09-23 LAB — PROCALCITONIN: Procalcitonin: <0.1 ng/mL

## 2020-09-23 MED ORDER — PERFLUTREN LIPID MICROSPHERE
1.0000 mL | INTRAVENOUS | Status: AC | PRN
  Administered 2020-09-23: 2 mL via INTRAVENOUS
  Filled 2020-09-23: qty 10

## 2020-09-23 MED ORDER — IPRATROPIUM-ALBUTEROL 0.5-2.5 (3) MG/3ML IN SOLN
3.0000 mL | Freq: Three times a day (TID) | RESPIRATORY_TRACT | Status: DC
  Administered 2020-09-23 – 2020-09-24 (×3): 3 mL via RESPIRATORY_TRACT
  Filled 2020-09-23 (×3): qty 3

## 2020-09-23 MED ORDER — SODIUM CHLORIDE 0.9 % IV SOLN
500.0000 mg | INTRAVENOUS | Status: DC
  Administered 2020-09-23: 500 mg via INTRAVENOUS
  Filled 2020-09-23: qty 500

## 2020-09-23 MED ORDER — METHYLPREDNISOLONE SODIUM SUCC 125 MG IJ SOLR
60.0000 mg | Freq: Every day | INTRAMUSCULAR | Status: DC
  Administered 2020-09-23 – 2020-09-25 (×3): 60 mg via INTRAVENOUS
  Filled 2020-09-23 (×3): qty 2

## 2020-09-23 MED ORDER — SODIUM CHLORIDE 0.9 % IV SOLN
2.0000 g | INTRAVENOUS | Status: DC
  Administered 2020-09-23: 2 g via INTRAVENOUS
  Filled 2020-09-23: qty 20
  Filled 2020-09-23: qty 2

## 2020-09-23 NOTE — Progress Notes (Addendum)
PROGRESS NOTE  [name] HDQ:[id number] DOB: September 17, 1941 DOA: 09/22/2020 PCP: Vivi Barrack, MD  Brief History   [name] is a 79 y.o. female with history of diastolic CHF last 2D echo done in 2018 showed EF of 65 to 70% with grade 1 diastolic dysfunction with chronic kidney disease stage III, hypertension, gout previous history of tobacco abuse has been experiencing increasing shortness of breath over the last 2 to 3 weeks.  As per patient's daughter patient's Lasix was discontinued in the first week of November after patient's creatinine was found to be increasing by her primary care physician.  Since then patient has been noticing increasing bilateral lower extremity edema shortness of breath.  Over the last 5 days patient also has been having productive cough wheezing which patient's primary care physician started patient on prednisone antibiotics.  Denies any chest pain.  In the ER patient was hypoxic requiring 3 L oxygen which is new for the patient also was found to be wheezing with chest x-ray showing possible infiltrates.  Labs are showing BNP of 35 high sensitive point of 10 EKG showing normal sinus rhythm creatinine 1.6 hemoglobin 11.8 Covid test was negative.  Patient was given steroids nebulizer treatment and Lasix 40 mg.  Cardiology was consulted.  On exam patient has bilateral lower extremity edema extending up to thighs.  [facility] have been consulted to admit the patient for further evaluation and treatment. Cardiology has been consulted, echocardiogram is pending, and the patient is being diuresed and is receiving IV Rocephin and azithromycin.  Consultants  . Cardiology  Procedures  . None  Antibiotics   Anti-infectives (From admission, onward)   Start     Dose/Rate Route Frequency Ordered Stop   09/23/20 1000  azithromycin (ZITHROMAX) 500 mg in sodium chloride 0.9 % 250 mL IVPB        500 mg 250 mL/hr over 60 Minutes Intravenous Every 24 hours  09/23/20 0621 09/28/20 0959   09/23/20 0800  cefTRIAXone (ROCEPHIN) 2 g in sodium chloride 0.9 % 100 mL IVPB        2 g 200 mL/hr over 30 Minutes Intravenous Every 24 hours 09/23/20 0621 09/28/20 0759   09/22/20 2300  doxycycline (VIBRAMYCIN) 100 mg in sodium chloride 0.9 % 250 mL IVPB        100 mg 125 mL/hr over 120 Minutes Intravenous Every 12 hours 09/22/20 2153      .  Subjective  The patient is resting comfortably. She is communicative, but extremely hard of hearing. Her daughter is at bedside and states that the patient seems a little less confused than at admission, although the patient is always somewhat confused at baseline due to dementia.  Objective   Vitals:  Vitals:   09/23/20 0748 09/23/20 1124  BP:  (!) 131/95  Pulse:  93  Resp:  20  Temp:  98.3 F (36.8 C)  SpO2: 97% 96%    Exam:  Constitutional:  . The patient is awake and alert. She is very hard of hearing and confused at baseline. She is in no acute distress. Respiratory:  . No increased work of breathing. . No wheezes or rhonchi . Rales at bases bilaterally . No tactile fremitus Cardiovascular:  . Regular rate and rhythm . No murmurs, ectopy, or gallups. . No lateral PMI. No thrills. Abdomen:  . Abdomen is soft, non-tender, non-distended . No hernias, masses, or organomegaly . Normoactive bowel sounds.  Musculoskeletal:  . No cyanosis, clubbing, or edema  Skin:  . No rashes, lesions, ulcers . palpation of skin: no induration or nodules Neurologic:  . Pt is moving all extremities Psychiatric:  . I am unable to evaluate the patient's psychiatric condition as she is unable to cooperate with exam.   I have personally reviewed the following:   Today's Data  . Vitals, CMP, CBC  Micro Data  . MRSA by PCR: Negative  Imaging  . CXR  Cardiology Data  . EKG . Echocardiogram: Pending  Scheduled Meds: . allopurinol  300 mg Oral Daily  . amLODipine  10 mg Oral Daily  . ascorbic acid  500 mg  Oral Daily  . atorvastatin  40 mg Oral QHS  . budesonide (PULMICORT) nebulizer solution  0.25 mg Nebulization BID  . buPROPion  300 mg Oral q morning - 10a  . enoxaparin (LOVENOX) injection  40 mg Subcutaneous QHS  . furosemide  40 mg Intravenous BID  . haloperidol  0.5 mg Oral BID  . hydrOXYzine  25 mg Oral QHS  . ipratropium-albuterol  3 mL Nebulization TID  . losartan  100 mg Oral Daily  . methylPREDNISolone (SOLU-MEDROL) injection  60 mg Intravenous Daily  . pantoprazole  40 mg Oral QHS  . traMADol  50 mg Oral Daily  . vitamin B-12  1,000 mcg Oral Daily   Continuous Infusions: . azithromycin 500 mg (09/23/20 1014)  . cefTRIAXone (ROCEPHIN)  IV 2 g (09/23/20 6433)  . doxycycline (VIBRAMYCIN) IV 100 mg (09/23/20 1205)    Principal Problem:   Acute on chronic diastolic CHF (congestive heart failure) (HCC) Active Problems:   Schizophrenia, chronic condition (HCC)   Chronic kidney disease (CKD), stage III (moderate) (HCC)   OSA (obstructive sleep apnea)   Acute CHF (congestive heart failure) ([place])   LOS: 1 day   A & P  Acute on chronic diastolic CHF last EF measured was 65 to 70% with grade 1 diastolic dysfunction for which cardiology was consulted and appreciate their input.  On Lasix 40 mg IV every 12.  2D echo has been ordered.  Note that patient has chronic disease and is on ARB in addition to the Lasix. Cardiology has been consulted.  Possible pneumonia with bronchitis: The patient is on empiric antibiotics check sputum cultures and if there is further wheezing may add steroids.  Presently on nebulizer and Pulmicort. Will check procalcitonin.   Chronic kidney disease stage III: Her creatinine appears to be at baseline.  Note that patient is on IV Lasix and ARB. At 1.55 the creatinine is slightly lower than at admission.  Hypertension: The patient has been continued on ARB and amlodipine as at home. Monitor.  Schizophrenia: The patient has been continued on Haldol and  Wellbutrin.  Anemia: Likely due to chronic renal disease. Will check renal panel. Monitor hemoglobin.  Gout: Pt has been continued on allopurinol.  Hyperlipidemia: Continue statins as at home.  I have seen and examined this patient myself. I have spent 34 minutes in her evaluation and care.  DVT prophylaxis: Lovenox. Code Status: Full code. Family Communication: Patient's daughter. Disposition Plan:  Status is: Inpatient  Remains inpatient appropriate because:IV treatments appropriate due to intensity of illness or inability to take PO   Dispo: The patient is from: Home              Anticipated d/c is to: TBD              Anticipated d/c date is: 2 days  Patient currently is not medically stable to d/c.  Benjiman Sedgwick, DO [facility] Direct contact: see [email or web address]  7PM-7AM contact night coverage as above 09/23/2020, 12:27 PM  LOS: 1 day   ADDENDUM: Procalcitonin was <0.10. Antibiotics stopped.

## 2020-09-23 NOTE — Progress Notes (Signed)
  Echocardiogram 2D Echocardiogram with contrast has been performed.  [name] F 09/23/2020, 10:34 AM

## 2020-09-23 NOTE — Progress Notes (Signed)
Progress Note  Patient Name: [name] Date of Encounter: 09/23/2020  [facility] Cardiologist: Johney Frame   Subjective   [name] is a 79 year old female with history of diastolic congestive heart failure.  She has normal left ventricular systolic function.  She also has CKD stage III, hypertension, past tobacco abuse.  She is admitted with several weeks of shortness of breath.  The patient's family states that the patient that the Lasix was discontinued several weeks ago when her creatinine was found to be elevated.  She is had progressive leg edema since that time.  + productive cough   BNP is 35. Trop is 10  Net I/O are even as of midnight last night- these are not accurate.   A large amount of urine was dumped last night and apparently not recorded.   Pt is moderately demented.  Daughter answered all / most questions  No CP      Inpatient Medications    Scheduled Meds: . allopurinol  300 mg Oral Daily  . amLODipine  10 mg Oral Daily  . ascorbic acid  500 mg Oral Daily  . atorvastatin  40 mg Oral QHS  . budesonide (PULMICORT) nebulizer solution  0.25 mg Nebulization BID  . buPROPion  300 mg Oral q morning - 10a  . enoxaparin (LOVENOX) injection  40 mg Subcutaneous QHS  . furosemide  40 mg Intravenous BID  . haloperidol  0.5 mg Oral BID  . hydrOXYzine  25 mg Oral QHS  . ipratropium-albuterol  3 mL Nebulization TID  . losartan  100 mg Oral Daily  . pantoprazole  40 mg Oral QHS  . traMADol  50 mg Oral Daily  . vitamin B-12  1,000 mcg Oral Daily   Continuous Infusions: . azithromycin    . cefTRIAXone (ROCEPHIN)  IV    . doxycycline (VIBRAMYCIN) IV 100 mg (09/22/20 2247)   PRN Meds: acetaminophen **OR** acetaminophen, albuterol, diclofenac sodium   Vital Signs    Vitals:   09/23/20 0400 09/23/20 0408 09/23/20 0725 09/23/20 0748  BP: (!) 141/59  (!) 166/63   Pulse: 73 72 84   Resp: 14 15 14    Temp: 98.4 F (36.9 C)  98.4 F (36.9 C)     TempSrc: Oral  Oral   SpO2: 94% 94% 97% 97%  Weight: 125.5 kg     Height:        Intake/Output Summary (Last 24 hours) at 09/23/2020 0804 Last data filed at 09/23/2020 0504 Gross per 24 hour  Intake 490.01 ml  Output 500 ml  Net -9.99 ml   Last 3 Weights 09/23/2020 09/23/2020 09/22/2020  Weight (lbs) 276 lb 10.8 oz 281 lb 4.9 oz 284 lb  Weight (kg) 125.5 kg 127.6 kg 128.822 kg  Some encounter information is confidential and restricted. Go to Review Flowsheets activity to see all data.      Telemetry    Sinus rhythm  - Personally Reviewed  ECG     - Personally Reviewed  Physical Exam   GEN: morbidly obese elderly female,  NAD  Neck: No JVD Cardiac: RRR, no murmurs, rubs, or gallops.  Respiratory: basilar rales, rhonchi  GI: Soft, nontender, non-distended  MS: 2+ pitting edema , up into thighs.   Reportedly better according to her duaghter.  Neuro:  Nonfocal  Psych: dementia  Labs    High Sensitivity Troponin:   Recent Labs  Lab 09/22/20 1747 09/22/20 1911  TROPONINIHS 10 12      Chemistry Recent  Labs  Lab 09/22/20 1528 09/22/20 1747 09/23/20 0051  NA 142 139 141  K 4.6 5.1 4.3  CL 107 104 108  CO2 21 24 23   GLUCOSE 106* 126* 208*  BUN 20 18 19   CREATININE 1.43* 1.61* 1.55*  CALCIUM 9.6 9.4 9.2  PROT  --  7.1 6.7  ALBUMIN  --  3.6 3.3*  AST  --  20 21  ALT  --  21 21  ALKPHOS  --  95 84  BILITOT  --  0.3 0.4  GFRNONAA  --  32* 34*  ANIONGAP  --  11 10     Hematology Recent Labs  Lab 09/22/20 1747 09/23/20 0051  WBC 10.1 8.1  RBC 3.89 3.53*  HGB 11.8* 11.0*  HCT 38.5 33.1*  MCV 99.0 93.8  MCH 30.3 31.2  MCHC 30.6 33.2  RDW 15.9* 15.8*  PLT 262 247    BNP Recent Labs  Lab 09/22/20 1747  BNP 35.2     DDimer No results for input(s): DDIMER in the last 168 hours.   Radiology    DG Chest Port 1 View  Result Date: 09/22/2020 CLINICAL DATA:  79 year old female with shortness of breath. EXAM: PORTABLE CHEST 1 VIEW  COMPARISON:  Chest radiograph dated 12/19/2019. FINDINGS: Bibasilar streaky densities may represent atelectasis or infiltrate. No pleural effusion pneumothorax. Mild cardiomegaly. No acute osseous pathology. IMPRESSION: Bibasilar streaky densities may represent atelectasis or infiltrate. Electronically Signed   By: Anner Crete M.D.   On: 09/22/2020 17:43    Cardiac Studies     Patient Profile     79 y.o. female  [place]    1.   Acute on chronic diastolic congestive heart failure: Patient presents with massive volume overload.  She is diuresing nicely.  Her ins and outs are not recorded accurately.  Her leg edema is better according to her daughter. Continue Lasix.  We will need to continue to watch her creatinine carefully. Repeat echocardiogram is scheduled for today.  2.  Hypertension: Continue current medications.   3.  Dementia: All a significant history was obtained from the daughter.  The patient is unable to answer any of her questions regarding her health care.       For questions or updates, please contact [place] Please consult [email or web address] for contact info under        Signed, Mertie Moores, MD  09/23/2020, 8:04 AM

## 2020-09-24 DIAGNOSIS — N1832 Chronic kidney disease, stage 3b: Secondary | ICD-10-CM | POA: Diagnosis not present

## 2020-09-24 DIAGNOSIS — F0390 Unspecified dementia without behavioral disturbance: Secondary | ICD-10-CM

## 2020-09-24 DIAGNOSIS — I5033 Acute on chronic diastolic (congestive) heart failure: Secondary | ICD-10-CM | POA: Diagnosis not present

## 2020-09-24 LAB — BASIC METABOLIC PANEL WITH GFR
Anion gap: 11 (ref 5–15)
BUN: 31 mg/dL — ABNORMAL HIGH (ref 8–23)
CO2: 21 mmol/L — ABNORMAL LOW (ref 22–32)
Calcium: 8.9 mg/dL (ref 8.9–10.3)
Chloride: 106 mmol/L (ref 98–111)
Creatinine, Ser: 1.77 mg/dL — ABNORMAL HIGH (ref 0.44–1.00)
GFR, Estimated: 29 mL/min — ABNORMAL LOW
Glucose, Bld: 187 mg/dL — ABNORMAL HIGH (ref 70–99)
Potassium: 4.8 mmol/L (ref 3.5–5.1)
Sodium: 138 mmol/L (ref 135–145)

## 2020-09-24 LAB — PROCALCITONIN: Procalcitonin: <0.1 ng/mL

## 2020-09-24 MED ORDER — IPRATROPIUM-ALBUTEROL 0.5-2.5 (3) MG/3ML IN SOLN
3.0000 mL | Freq: Two times a day (BID) | RESPIRATORY_TRACT | Status: DC
  Administered 2020-09-24 – 2020-09-25 (×2): 3 mL via RESPIRATORY_TRACT
  Filled 2020-09-24 (×2): qty 3

## 2020-09-24 MED ORDER — GUAIFENESIN-DM 100-10 MG/5ML PO SYRP
10.0000 mL | ORAL_SOLUTION | ORAL | Status: DC | PRN
  Administered 2020-09-24: 10 mL via ORAL
  Filled 2020-09-24: qty 10

## 2020-09-24 MED ORDER — FUROSEMIDE 40 MG PO TABS: 40.0000 mg | ORAL_TABLET | Freq: Every day | ORAL | Status: DC

## 2020-09-24 NOTE — Progress Notes (Addendum)
Progress Note  Patient Name: [name] Date of Encounter: 09/24/2020  [facility] Cardiologist: Johney Frame   Subjective   Mrs. [name] is a 79 year old female with history of diastolic congestive heart failure.  She has normal left ventricular systolic function.  She also has CKD stage III, hypertension, past tobacco abuse.  She is admitted with several weeks of shortness of breath.  The patient's family states that the patient that the Lasix was discontinued several weeks ago when her creatinine was found to be elevated.  She is had progressive leg edema since that time.  + productive cough  11/26 Pt not able to answer questions well. Is not in acute distress. No c/o pain. Dtr is in room. Pt seems to be breathing a little better. O2 need is down to 3 lpm from 5 lpm.  Pt has not been weighed today. No output recorded overnight, day RN not aware of any. Currently w/ 400 ml in canister, not charted yet.    Inpatient Medications    Scheduled Meds: . allopurinol  300 mg Oral Daily  . amLODipine  10 mg Oral Daily  . ascorbic acid  500 mg Oral Daily  . atorvastatin  40 mg Oral QHS  . budesonide (PULMICORT) nebulizer solution  0.25 mg Nebulization BID  . buPROPion  300 mg Oral q morning - 10a  . enoxaparin (LOVENOX) injection  40 mg Subcutaneous QHS  . furosemide  40 mg Intravenous BID  . haloperidol  0.5 mg Oral BID  . hydrOXYzine  25 mg Oral QHS  . ipratropium-albuterol  3 mL Nebulization TID  . losartan  100 mg Oral Daily  . methylPREDNISolone (SOLU-MEDROL) injection  60 mg Intravenous Daily  . pantoprazole  40 mg Oral QHS  . traMADol  50 mg Oral Daily  . vitamin B-12  1,000 mcg Oral Daily   Continuous Infusions:   PRN Meds: acetaminophen **OR** acetaminophen, albuterol, diclofenac sodium   Vital Signs    Vitals:   09/23/20 2106 09/23/20 2310 09/24/20 0440 09/24/20 0755  BP:  132/62 (!) 108/55 (!) 144/83  Pulse:  82  82  Resp:  20 14 20   Temp:  98.5 F  (36.9 C) 97.8 F (36.6 C) 98.5 F (36.9 C)  TempSrc:  Oral Axillary Oral  SpO2: 100% 100%  97%  Weight:      Height:        Intake/Output Summary (Last 24 hours) at 09/24/2020 0916 Last data filed at 09/24/2020 0630 Gross per 24 hour  Intake 1323.2 ml  Output 600 ml  Net 723.2 ml   Last 3 Weights 09/23/2020 09/23/2020 09/22/2020  Weight (lbs) 276 lb 10.8 oz 281 lb 4.9 oz 284 lb  Weight (kg) 125.5 kg 127.6 kg 128.822 kg  Some encounter information is confidential and restricted. Go to Review Flowsheets activity to see all data.      Telemetry    SR, PVCs  - Personally Reviewed  ECG    None today - Personally Reviewed  Physical Exam   GEN: No acute distress.   Neck: JVD approx 9 cm Cardiac: RRR, no murmur, no rubs, or gallops.  Respiratory: scattered rhonchi, very few rales, slight wheeze GI: Soft, non tender, mildly distended  MS:  No edema; No deformity. Neuro:  Nonfocal  Psych: dementia   Labs    High Sensitivity Troponin:   Recent Labs  Lab 09/22/20 1747 09/22/20 1911  TROPONINIHS 10 12      Chemistry Recent [employer]  09/22/20 1747 09/23/20 0051 09/24/20 0038  NA 139 141 138  K 5.1 4.3 4.8  CL 104 108 106  CO2 24 23 21*  GLUCOSE 126* 208* 187*  BUN 18 19 31*  CREATININE 1.61* 1.55* 1.77*  CALCIUM 9.4 9.2 8.9  PROT 7.1 6.7  --   ALBUMIN 3.6 3.3*  --   AST 20 21  --   ALT 21 21  --   ALKPHOS 95 84  --   BILITOT 0.3 0.4  --   GFRNONAA 32* 34* 29*  ANIONGAP 11 10 11      Hematology Recent Labs  Lab 09/22/20 1747 09/23/20 0051  WBC 10.1 8.1  RBC 3.89 3.53*  HGB 11.8* 11.0*  HCT 38.5 33.1*  MCV 99.0 93.8  MCH 30.3 31.2  MCHC 30.6 33.2  RDW 15.9* 15.8*  PLT 262 247    BNP Recent Labs  Lab 09/22/20 1747  BNP 35.2     DDimer No results for input(s): DDIMER in the last 168 hours.   Radiology    DG Chest Port 1 View  Result Date: 09/22/2020 CLINICAL DATA:  79 year old female with shortness of breath. EXAM: PORTABLE CHEST  1 VIEW COMPARISON:  Chest radiograph dated 12/19/2019. FINDINGS: Bibasilar streaky densities may represent atelectasis or infiltrate. No pleural effusion pneumothorax. Mild cardiomegaly. No acute osseous pathology. IMPRESSION: Bibasilar streaky densities may represent atelectasis or infiltrate. Electronically Signed   By: Anner Crete M.D.   On: 09/22/2020 17:43   ECHOCARDIOGRAM COMPLETE  Result Date: 09/23/2020    ECHOCARDIOGRAM REPORT   Patient Name:   [name] [facility] Date of Exam: 09/23/2020 Medical Rec #:  [id number]        Height:       63.5 in Accession #:    [id number]       Weight:       276.7 lb Date of Birth:  15-Jan-1941       BSA:          2.233 m Patient Age:    37 years         BP:           166/63 mmHg Patient Gender: F                HR:           77 bpm. Exam Location:  Inpatient Procedure: 2D Echo, Cardiac Doppler and Color Doppler Indications:    J82.50 Acute diastolic (congestive) heart failure  History:        Patient has prior history of Echocardiogram examinations, most                 recent 07/26/2017. Signs/Symptoms:Altered Mental Status.  Sonographer:    Merrie Roof RDCS Referring Phys: [place]  1. Left ventricular ejection fraction, by estimation, is 65 to 70%. The left ventricle has normal function. The left ventricle has no regional wall motion abnormalities. There is mild left ventricular hypertrophy. Left ventricular diastolic parameters are consistent with Grade I diastolic dysfunction (impaired relaxation).  2. Right ventricular systolic function is normal. The right ventricular size is normal. There is mildly elevated pulmonary artery systolic pressure.  3. The mitral valve is normal in structure. No evidence of mitral valve regurgitation. No evidence of mitral stenosis.  4. The aortic valve is normal in structure. Aortic valve regurgitation is not visualized. No aortic stenosis is present.  5. The inferior vena cava is normal in size with  greater than  50% respiratory variability, suggesting right atrial pressure of 3 mmHg. FINDINGS  Left Ventricle: Left ventricular ejection fraction, by estimation, is 65 to 70%. The left ventricle has normal function. The left ventricle has no regional wall motion abnormalities. The left ventricular internal cavity size was normal in size. There is  mild left ventricular hypertrophy. Left ventricular diastolic parameters are consistent with Grade I diastolic dysfunction (impaired relaxation). Right Ventricle: The right ventricular size is normal. No increase in right ventricular wall thickness. Right ventricular systolic function is normal. There is mildly elevated pulmonary artery systolic pressure. The tricuspid regurgitant velocity is 2.78  m/s, and with an assumed right atrial pressure of 8 mmHg, the estimated right ventricular systolic pressure is 65.6 mmHg. Left Atrium: Left atrial size was normal in size. Right Atrium: Right atrial size was normal in size. Pericardium: There is no evidence of pericardial effusion. Mitral Valve: The mitral valve is normal in structure. No evidence of mitral valve regurgitation. No evidence of mitral valve stenosis. Tricuspid Valve: The tricuspid valve is normal in structure. Tricuspid valve regurgitation is not demonstrated. No evidence of tricuspid stenosis. Aortic Valve: The aortic valve is normal in structure. Aortic valve regurgitation is not visualized. No aortic stenosis is present. Pulmonic Valve: The pulmonic valve was normal in structure. Pulmonic valve regurgitation is not visualized. No evidence of pulmonic stenosis. Aorta: The aortic root is normal in size and structure. Venous: The inferior vena cava is normal in size with greater than 50% respiratory variability, suggesting right atrial pressure of 3 mmHg. IAS/Shunts: No atrial level shunt detected by color flow Doppler.  LEFT VENTRICLE PLAX 2D LVIDd:         3.00 cm      Diastology LVIDs:         2.30 cm      LV  e' medial:    9.57 cm/s LV PW:         1.30 cm      LV E/e' medial:  10.7 LV IVS:        1.30 cm      LV e' lateral:   10.70 cm/s LVOT diam:     2.10 cm      LV E/e' lateral: 9.5 LV SV:         90 LV SV Index:   40 LVOT Area:     3.46 cm  LV Volumes (MOD) LV vol d, MOD A4C: 101.0 ml LV vol s, MOD A4C: 45.4 ml LV SV MOD A4C:     101.0 ml RIGHT VENTRICLE RV Basal diam:  3.60 cm LEFT ATRIUM           Index       RIGHT ATRIUM           Index LA diam:      2.70 cm 1.21 cm/m  RA Area:     14.80 cm LA Vol (A4C): 79.9 ml 35.79 ml/m RA Volume:   37.50 ml  16.80 ml/m  AORTIC VALVE LVOT Vmax:   127.00 cm/s LVOT Vmean:  91.700 cm/s LVOT VTI:    0.260 m  AORTA Ao Root diam: 2.70 cm MITRAL VALVE                TRICUSPID VALVE MV Area (PHT): 5.31 cm     TR Peak grad:   30.9 mmHg MV Decel Time: 143 msec     TR Vmax:        278.00 cm/s MV E velocity: 102.00 cm/s  MV A velocity: 115.00 cm/s  SHUNTS MV E/A ratio:  0.89         Systemic VTI:  0.26 m                             Systemic Diam: 2.10 cm Candee Furbish MD Electronically signed by Candee Furbish MD Signature Date/Time: 09/23/2020/2:30:33 PM    Final     Cardiac Studies   ECHO: 09/23/2020  1. Left ventricular ejection fraction, by estimation, is 65 to 70%. The  left ventricle has normal function. The left ventricle has no regional  wall motion abnormalities. There is mild left ventricular hypertrophy.  Left ventricular diastolic parameters  are consistent with Grade I diastolic dysfunction (impaired relaxation).   2. Right ventricular systolic function is normal. The right ventricular  size is normal. There is mildly elevated pulmonary artery systolic  pressure. RVSP 38.9 mmHg  3. The mitral valve is normal in structure. No evidence of mitral valve  regurgitation. No evidence of mitral stenosis.   4. The aortic valve is normal in structure. Aortic valve regurgitation is  not visualized. No aortic stenosis is present.   5. The inferior vena cava is normal in  size with greater than 50%  respiratory variability, suggesting right atrial pressure of 3 mmHg.   Patient Profile     79 y.o. female w/ hx D-CHF, bilat SDH, HTN, HLD, GERD, CKD III, dementia was admitted 11/24 with SOB (Lasix d/c'd several weeks ago due to elev Cr).  Assessment & Plan    1.   Acute on chronic diastolic congestive heart failure:  - Pt wt up 17 lbs since June - currently on Lasix 40 mg IV bid - I/O net +753.2 ml but may be incomplete - wt today pending - per dtr, volume status much improved - 24-48 hr, pt has had am dose - pt has gained 40 lbs in the past year, unclear how much is fluid. - wt 266 03/2020, 283 08/30/2020 and 284 on admit >> 281 >> 276 >> today pending.  - family not able to weigh at home  2.  Hypertension:  - SBP 100s-140s last 24 hr - on losartan 100 mg qd, with increased BUN/Cr - on home amlodipine 10 mg qd  3.  CKD III - BUN/Cr increasing w/ diuresis - will hold losartan till MD sees  4. Dementia:  - Dtr is present and helps w/ pt care.    Otherwise, per IM Principal Problem:   Acute on chronic diastolic CHF (congestive heart failure) (HCC) Active Problems:   Schizophrenia, chronic condition (HCC)   Chronic kidney disease (CKD), stage III (moderate) (HCC)   OSA (obstructive sleep apnea)   Acute CHF (congestive heart failure) ([place])   For questions or updates, please contact [place] [facility] Please consult [email or web address] for contact info under      Signed, Rosaria Ferries, PA-C  09/24/2020, 9:16 AM    Personally seen and examined. Agree with APP above with the following comments: Briefly 79 yo F with a history of dementia, CKD Stage II, ANF HFpEF.  ABx for possible PNA stopped today. Discussed at length the balance between diuretics and kidney function.  Still has some volume left.  Weaned patient down to 1 L (95%). Will tentatively start lasix 40 mg PO daily.  Agree with primary that may need more aggressive diuresis; finding  difficulty with balance of diuretics and family's wishes.  Discussed with primary  MD; discussed at length with daughter  Werner Lean, MD

## 2020-09-24 NOTE — Progress Notes (Signed)
PROGRESS NOTE  TEIA FREITAS ZOX:[id number] DOB: 03/11/1941 DOA: 09/22/2020 PCP: Vivi Barrack, MD  Brief History   NOELE ICENHOUR is a 79 y.o. female with history of diastolic CHF last 2D echo done in 2018 showed EF of 65 to 70% with grade 1 diastolic dysfunction with chronic kidney disease stage III, hypertension, gout previous history of tobacco abuse has been experiencing increasing shortness of breath over the last 2 to 3 weeks.  As per patient's daughter patient's Lasix was discontinued in the first week of November after patient's creatinine was found to be increasing by her primary care physician.  Since then patient has been noticing increasing bilateral lower extremity edema shortness of breath.  Over the last 5 days patient also has been having productive cough wheezing which patient's primary care physician started patient on prednisone antibiotics.  Denies any chest pain.  In the ER patient was hypoxic requiring 3 L oxygen which is new for the patient also was found to be wheezing with chest x-ray showing possible infiltrates.  Labs are showing BNP of 35 high sensitive point of 10 EKG showing normal sinus rhythm creatinine 1.6 hemoglobin 11.8 Covid test was negative.  Patient was given steroids nebulizer treatment and Lasix 40 mg.  Cardiology was consulted.  On exam patient has bilateral lower extremity edema extending up to thighs.  [facility] have been consulted to admit the patient for further evaluation and treatment. Cardiology has been consulted, echocardiogram is pending, and the patient is being diuresed and is receiving IV Rocephin and azithromycin.   Consultants  . Cardiology  Procedures  . None  Antibiotics   Anti-infectives (From admission, onward)   Start     Dose/Rate Route Frequency Ordered Stop   09/23/20 1000  azithromycin (ZITHROMAX) 500 mg in sodium chloride 0.9 % 250 mL IVPB  Status:  Discontinued        500 mg 250 mL/hr over 60 Minutes  Intravenous Every 24 hours 09/23/20 0621 09/23/20 1634   09/23/20 0800  cefTRIAXone (ROCEPHIN) 2 g in sodium chloride 0.9 % 100 mL IVPB  Status:  Discontinued        2 g 200 mL/hr over 30 Minutes Intravenous Every 24 hours 09/23/20 0621 09/23/20 1634   09/22/20 2300  doxycycline (VIBRAMYCIN) 100 mg in sodium chloride 0.9 % 250 mL IVPB  Status:  Discontinued        100 mg 125 mL/hr over 120 Minutes Intravenous Every 12 hours 09/22/20 2153 09/24/20 0826     Subjective  The patient is resting comfortably. She is communicative, but extremely hard of hearing. No new complaints.  Objective   Vitals:  Vitals:   09/24/20 0923 09/24/20 1309  BP:  (!) 145/71  Pulse:  78  Resp:  18  Temp:    SpO2: 97% 95%    Exam:  Constitutional:  . The patient is awake and alert. She is very hard of hearing and confused at baseline. She is in no acute distress. Respiratory:  . No increased work of breathing. . No wheezes or rhonchi . Rales at bases bilaterally improved. . No tactile fremitus Cardiovascular:  . Regular rate and rhythm . No murmurs, ectopy, or gallups. . No lateral PMI. No thrills. Abdomen:  . Abdomen is soft, non-tender, non-distended . No hernias, masses, or organomegaly . Normoactive bowel sounds.  Musculoskeletal:  . No cyanosis or clubbing . Lower extremity edema is resolved. Skin:  . No rashes, lesions, ulcers . palpation of skin: no  induration or nodules Neurologic:  . Pt is moving all extremities Psychiatric:  . I am unable to evaluate the patient's psychiatric condition as she is unable to cooperate with exam.  I have personally reviewed the following:   Today's Data  . Vitals, BMP  Micro Data  . MRSA by PCR: Negative  Imaging  . CXR  Cardiology Data  . EKG . Echocardiogram: Pending  Scheduled Meds: . allopurinol  300 mg Oral Daily  . amLODipine  10 mg Oral Daily  . ascorbic acid  500 mg Oral Daily  . atorvastatin  40 mg Oral QHS  . budesonide  (PULMICORT) nebulizer solution  0.25 mg Nebulization BID  . buPROPion  300 mg Oral q morning - 10a  . enoxaparin (LOVENOX) injection  40 mg Subcutaneous QHS  . furosemide  40 mg Intravenous BID  . furosemide  40 mg Oral Daily  . haloperidol  0.5 mg Oral BID  . hydrOXYzine  25 mg Oral QHS  . ipratropium-albuterol  3 mL Nebulization BID  . losartan  100 mg Oral Daily  . methylPREDNISolone (SOLU-MEDROL) injection  60 mg Intravenous Daily  . pantoprazole  40 mg Oral QHS  . traMADol  50 mg Oral Daily  . vitamin B-12  1,000 mcg Oral Daily   Continuous Infusions:   Principal Problem:   Acute on chronic diastolic CHF (congestive heart failure) (HCC) Active Problems:   Schizophrenia, chronic condition (HCC)   Chronic kidney disease (CKD), stage III (moderate) (HCC)   OSA (obstructive sleep apnea)   Acute CHF (congestive heart failure) (HCC)   LOS: 2 days   A & P  Acute on chronic diastolic CHF last EF measured was 65 to 70% with grade 1 diastolic dysfunction for which cardiology was consulted and appreciate their input.  On Lasix 40 mg IV every 12.  2D echo has been ordered.  Note that patient has chronic disease and is on ARB in addition to the Lasix. Cardiology has been consulted. Slowly improving.  Possible pneumonia with bronchitis: The patient is on empiric antibiotics check sputum cultures and if there is further wheezing may add steroids.  Presently on nebulizer and Pulmicort. Procalcitonin was <0.01. Antibiotics were stopped.  Chronic kidney disease stage III: Her creatinine appears to be at baseline.  Note that patient is on IV Lasix and ARB. Creatinine is 1.77 today.  Hypertension: The patient has been continued on ARB and amlodipine as at home. Monitor.  Schizophrenia: The patient has been continued on Haldol and Wellbutrin.  Anemia: Likely due to chronic renal disease. Will check renal panel. Monitor hemoglobin.  Gout: Pt has been continued on  allopurinol.  Hyperlipidemia: Continue statins as at home.  I have seen and examined this patient myself. I have spent 32 minutes in her evaluation and care.  DVT prophylaxis: Lovenox. Code Status: Full code. Family Communication: Patient's daughter. Disposition Plan:  Status is: Inpatient  Remains inpatient appropriate because:IV treatments appropriate due to intensity of illness or inability to take PO   Dispo: The patient is from: Home              Anticipated d/c is to: TBD              Anticipated d/c date is: 2 days              Patient currently is not medically stable to d/c.  Kirbie Stodghill, DO [facility] Direct contact: see [email or web address]  7PM-7AM contact night coverage as above 09/24/2020,  12:27 PM  LOS: 1 day

## 2020-09-24 NOTE — Progress Notes (Signed)
Mrs. Ringley swelling in her feet has decreased substantially, but still approx. +3 edema bilaterally in calves and thighs, however. Pt.'s daughter did not want her to get evening lasix so I held it. She is concerned about lasix increasing creatinine/BUN levels and decreasing kidney function, with her biggest concern being that her mother will have to begin dialysis d/t lasix consumption. She has very good insight and wants to do everything she can to prevent anything from getting worse. Perhaps more mechanical means of diuresis (compression socks, physical therapy, etc.) would help decrease swelling while helping prevent further declining kidney function.

## 2020-09-24 NOTE — Plan of Care (Signed)

## 2020-09-25 DIAGNOSIS — I5033 Acute on chronic diastolic (congestive) heart failure: Secondary | ICD-10-CM | POA: Diagnosis not present

## 2020-09-25 LAB — LEGIONELLA PNEUMOPHILA SEROGP 1 UR AG: L. pneumophila Serogp 1 Ur Ag: NEGATIVE

## 2020-09-25 LAB — BASIC METABOLIC PANEL
Anion gap: 13 (ref 5–15)
BUN: 41 mg/dL — ABNORMAL HIGH (ref 8–23)
CO2: 24 mmol/L (ref 22–32)
Calcium: 8.8 mg/dL — ABNORMAL LOW (ref 8.9–10.3)
Chloride: 103 mmol/L (ref 98–111)
Creatinine, Ser: 1.77 mg/dL — ABNORMAL HIGH (ref 0.44–1.00)
GFR, Estimated: 29 mL/min — ABNORMAL LOW (ref >60)
Glucose, Bld: 145 mg/dL — ABNORMAL HIGH (ref 70–99)
Potassium: 4.5 mmol/L (ref 3.5–5.1)
Sodium: 140 mmol/L (ref 135–145)

## 2020-09-25 MED ORDER — BUDESONIDE 0.25 MG/2ML IN SUSP: 0.2500 mg | Freq: Two times a day (BID) | RESPIRATORY_TRACT | 12 refills | Status: DC

## 2020-09-25 MED ORDER — PREDNISONE 20 MG PO TABS: ORAL_TABLET | ORAL | 0 refills | Status: AC

## 2020-09-25 MED ORDER — IPRATROPIUM-ALBUTEROL 0.5-2.5 (3) MG/3ML IN SOLN: 3.0000 mL | Freq: Two times a day (BID) | RESPIRATORY_TRACT | 0 refills | Status: DC

## 2020-09-25 MED ORDER — PREDNISONE 10 MG PO TABS: 10.0000 mg | ORAL_TABLET | Freq: Every day | ORAL | 0 refills | Status: DC

## 2020-09-25 MED ORDER — BENZONATATE 100 MG PO CAPS
200.0000 mg | ORAL_CAPSULE | Freq: Three times a day (TID) | ORAL | Status: DC | PRN
  Administered 2020-09-25: 200 mg via ORAL
  Filled 2020-09-25: qty 2

## 2020-09-25 MED ORDER — CARVEDILOL 3.125 MG PO TABS: 3.1250 mg | ORAL_TABLET | Freq: Two times a day (BID) | ORAL | 11 refills | Status: DC

## 2020-09-25 MED ORDER — BENZONATATE 200 MG PO CAPS: 200.0000 mg | ORAL_CAPSULE | Freq: Three times a day (TID) | ORAL | 0 refills | Status: DC | PRN

## 2020-09-25 NOTE — TOC Transition Note (Signed)
Transition of Care [facility]) - CM/SW Discharge Note   Patient Details  Name: [name] MRN: [id number] Date of Birth: 03-30-1941  [facility]) CM/SW Contact:  Harriet Masson, RN Phone Number: 09/25/2020, 11:05 AM   Clinical Narrative:    [facility] consult for HF screening. RN spoke with POA daughter [name]) and educated on the HF zones verifying pt remains in the GREEN zone today. Discussed the importance of daily weights and when to contact the pt's provider with encountered symptoms. Encouraged the caregiver to document all readings for providers to view. Teach-back method confirmed with 3 lbs overnight and 5 lbs within one week with any precipitating symptoms aware to contact the provider. Pt lives in a single family home with her son and visiting daughter. Confirmed pt is able to afford all her medications sufficient transportation to all medical appointments. Pt has [facility] for mail order medications however will use [facility] for immediate medications.  TOC team will continue to follow via discharge needs.   Final next level of care: Home/Self Care Barriers to Discharge: No Barriers Identified   Patient Goals and CMS Choice        Discharge Placement                  Name of family member notified: [name] Patient and family notified of of transfer: 09/25/20  Discharge Plan and Services                                     Social Determinants of Health (SDOH) Interventions     Readmission Risk Interventions No flowsheet data found.

## 2020-09-25 NOTE — TOC Transition Note (Signed)
Transition of Care [facility]) - CM/SW Discharge Note   Patient Details  Name: [name] MRN: [id number] Date of Birth: [birth year]  [facility]) CM/SW Contact:  Carles Collet, RN Phone Number: 09/25/2020, 11:43 AM   Clinical Narrative:   Nebulizer ordered at request of daughter, ordered, will be delivered to room prior to DC    Final next level of care: Home/Self Care Barriers to Discharge: No Barriers Identified   Patient Goals and CMS Choice        Discharge Placement                  Name of family member notified: [name] Patient and family notified of of transfer: 09/25/20  Discharge Plan and Services                DME Arranged: Nebulizer/meds DME Agency: [employer] Date DME Agency Contacted: 09/25/20 Time DME Agency Contacted: [phone] Representative spoke with at DME Agency: Jodell Cipro            Social Determinants of Health ([place]) Interventions     Readmission Risk Interventions No flowsheet data found.

## 2020-09-25 NOTE — Progress Notes (Signed)
Discharge orders given to patients daughter, [name] at bedside. Patient understands discharge orders and follow up appointments, questions were answered. Patient and daughter currently waiting for nebulizer to be delivered to room.

## 2020-09-25 NOTE — Discharge Summary (Signed)
Physician Discharge Summary  [name] ERX:[id number] DOB: [birth year] DOA: 09/22/2020  PCP: Vivi Barrack, MD  Admit date: 09/22/2020 Discharge date: 09/25/2020  Recommendations for Outpatient Follow-up:  1. Discharge to home. 2. Follow up with PCP in 7-10 days. Have chemistry drawn on that date. 3. Weigh patient daily at home. Call Dr for weight gain of 3 lbs or more in one day or 5 lbs or more in 1 week.  Discharge Diagnoses: Principal diagnosis is #1 1. Acute on chronic diastolic CHF. 2. Acute bronchopneumonia 3. Acute hypoxic respiratory failure 4. CKD III b 5. Hypertension 6. Schizophrenia 7. Anemia 8. Gout 9. Morbid Obesity, Class III with BMI 48.36  Discharge Condition: Fair  Disposition: Home  Diet recommendation: heart healthy  Filed Weights   09/23/20 0005 09/23/20 0400 09/25/20 0410  Weight: 127.6 kg 125.5 kg 125.8 kg    History of present illness: [name] is a 79 y.o. female with history of diastolic CHF last 2D echo done in 2018 showed EF of 65 to 70% with grade 1 diastolic dysfunction with chronic kidney disease stage III, hypertension, gout previous history of tobacco abuse has been experiencing increasing shortness of breath over the last 2 to 3 weeks.  As per patient's daughter patient's Lasix was discontinued in the first week of November after patient's creatinine was found to be increasing by her primary care physician.  Since then patient has been noticing increasing bilateral lower extremity edema shortness of breath.  Over the last 5 days patient also has been having productive cough wheezing which patient's primary care physician started patient on prednisone antibiotics.  Denies any chest pain.  ED Course: In the ER patient was hypoxic requiring 3 L oxygen which is new for the patient also was found to be wheezing with chest x-ray showing possible infiltrates.  Labs are showing BNP of 35 high sensitive point of 10 EKG showing normal  sinus rhythm creatinine 1.6 hemoglobin 11.8 Covid test was negative.  Patient was given steroids nebulizer treatment and Lasix 40 mg.  Cardiology was consulted.  On exam patient has bilateral lower extremity edema extending up to thighs.  Hospital Course:  [facility] have been consulted to admit the patient for further evaluation and treatment. Cardiology has been consulted, echocardiogram is pending, and the patient is being diuresed and is receiving IV Rocephin and azithromycin. However, procalcitonin was negative and antibiotics were stopped. The patient was started on a steroid taper. The patient's oxygen requirements decreased over time. Her diuretics were discontinued at the patient's daughter's request. On 09/25/2020 the patient was saturating at 94% on room air. She was dicharged to home in fair condition.  Today's assessment: S: The patient is resting comfortably. No new complaints.  O: Vitals:  Vitals:   09/25/20 0749 09/25/20 0850  BP: (!) 147/86   Pulse: 82   Resp: 19   Temp: 98.5 F (36.9 C)   SpO2: 96% 94%   Exam:  Constitutional:   The patient is awake and alert. She is very hard of hearing and confused at baseline. She is in no acute distress. Respiratory:   No increased work of breathing.  No wheezes or rhonchi  Rales at bases bilaterally improved.  No tactile fremitus Cardiovascular:   Regular rate and rhythm  No murmurs, ectopy, or gallups.  No lateral PMI. No thrills. Abdomen:   Abdomen is soft, non-tender, non-distended  No hernias, masses, or organomegaly  Normoactive bowel sounds.  Musculoskeletal:   No cyanosis  or clubbing  Lower extremity edema is resolved. Skin:   No rashes, lesions, ulcers  palpation of skin: no induration or nodules Neurologic:   Pt is moving all extremities Psychiatric:   I am unable to evaluate the patient's psychiatric condition as she is unable to cooperate with exam.  Discharge  Instructions  Discharge Instructions    (HEART FAILURE PATIENTS) Call MD:  Anytime you have any of the following symptoms: 1) 3 pound weight gain in 24 hours or 5 pounds in 1 week 2) shortness of breath, with or without a dry hacking cough 3) swelling in the hands, feet or stomach 4) if you have to sleep on extra pillows at night in order to breathe.   Complete by: As directed    AMB referral to CHF clinic   Complete by: As directed    Call MD for:  difficulty breathing, headache or visual disturbances   Complete by: As directed    Diet - low sodium heart healthy   Complete by: As directed    Discharge instructions   Complete by: As directed    Discharge to home. Follow up with PCP in 7-10 days. Have chemistry drawn on that date. Weigh patient daily at home. Call Dr for weight gain of 3 lbs or more in one day or 5 lbs or more in 1 week.   For home use only DME Nebulizer machine   Complete by: As directed    Patient needs a nebulizer to treat with the following condition: Acute bronchitis   Length of Need: Lifetime   Heart Failure patients record your daily weight using the same scale at the same time of day   Complete by: As directed    Increase activity slowly   Complete by: As directed    STOP any activity that causes chest pain, shortness of breath, dizziness, sweating, or exessive weakness   Complete by: As directed      Allergies as of 09/25/2020      Reactions   Aspirin Other (See Comments)   Bleeding from vagina   Sulfamethoxazole-trimethoprim Itching      Medication List    STOP taking these medications   azithromycin 250 MG tablet Commonly known as: ZITHROMAX   fluconazole 150 MG tablet Commonly known as: DIFLUCAN     TAKE these medications   acetaminophen 500 MG tablet Commonly known as: TYLENOL Take 500 mg by mouth at bedtime.   allopurinol 300 MG tablet Commonly known as: ZYLOPRIM TAKE 1 TABLET BY MOUTH  DAILY   amLODipine 10 MG tablet Commonly known  as: NORVASC Take 1 tablet (10 mg total) by mouth daily.   ascorbic acid 500 MG tablet Commonly known as: VITAMIN C Take 500 mg by mouth daily.   atorvastatin 40 MG tablet Commonly known as: LIPITOR TAKE 1 TABLET BY MOUTH  DAILY What changed: when to take this   benzonatate 200 MG capsule Commonly known as: TESSALON Take 1 capsule (200 mg total) by mouth 3 (three) times daily as needed for cough. Notes to patient: Last dose given at 4am, give 3x a day   budesonide 0.25 MG/2ML nebulizer solution Commonly known as: PULMICORT Take 2 mLs (0.25 mg total) by nebulization 2 (two) times daily.   buPROPion 300 MG 24 hr tablet Commonly known as: WELLBUTRIN XL Take 1 tablet (300 mg total) by mouth every morning.   carvedilol 3.125 MG tablet Commonly known as: Coreg Take 1 tablet (3.125 mg total) by mouth 2 (two)  times daily.   diclofenac sodium 1 % Gel Commonly known as: VOLTAREN APPLY 4 GRAMS TOPICALLY 4  TIMES DAILY What changed:   how much to take  how to take this  when to take this  reasons to take this  additional instructions   fluticasone 50 MCG/ACT nasal spray Commonly known as: FLONASE USE 2 SPRAYS IN BOTH  NOSTRILS DAILY What changed:   how much to take  how to take this  when to take this  reasons to take this  additional instructions   furosemide 20 MG tablet Commonly known as: LASIX TAKE 1 TABLET BY MOUTH  DAILY AS NEEDED FOR  SWELLING AND WEIGHT GAIN What changed: See the new instructions.   haloperidol 0.5 MG tablet Commonly known as: HALDOL Take 1 tablet at bedtime and second tablet if needed for agitation. What changed:   how much to take  how to take this  when to take this   hydrOXYzine 25 MG capsule Commonly known as: VISTARIL TAKE 1 CAPSULE BY MOUTH AT  BEDTIME   ipratropium-albuterol 0.5-2.5 (3) MG/3ML Soln Commonly known as: DUONEB Take 3 mLs by nebulization 2 (two) times daily.   losartan 100 MG tablet Commonly known as:  COZAAR Take 1 tablet (100 mg total) by mouth daily.   pantoprazole 40 MG tablet Commonly known as: PROTONIX Take 1 tablet once daily. What changed:   how much to take  how to take this  when to take this  additional instructions   predniSONE 20 MG tablet Commonly known as: DELTASONE Take 3 tablets (60 mg total) by mouth daily for 3 days, THEN 2.5 tablets (50 mg total) daily for 3 days, THEN 2 tablets (40 mg total) daily for 3 days, THEN 1.5 tablets (30 mg total) daily for 3 days, THEN 1 tablet (20 mg total) daily for 3 days, THEN 0.5 tablets (10 mg total) daily for 3 days. Start taking on: September 25, 2020 What changed: See the new instructions.   traMADol 50 MG tablet Commonly known as: ULTRAM TAKE 1 TABLET BY MOUTH  TWICE DAILY AS NEEDED What changed:   how much to take  how to take this  when to take this  additional instructions   Vitamin B12 1000 MCG Tbcr Take 1 tablet by mouth daily.            Durable Medical Equipment  (From admission, onward)         Start     Ordered   09/25/20 1139  For home use only DME Nebulizer machine  Once       Question Answer Comment  Patient needs a nebulizer to treat with the following condition CHF (congestive heart failure) ([place])   Length of Need Lifetime      09/25/20 1140   09/25/20 0000  For home use only DME Nebulizer machine       Question Answer Comment  Patient needs a nebulizer to treat with the following condition Acute bronchitis   Length of Need Lifetime      09/25/20 1020         Allergies  Allergen Reactions  . Aspirin Other (See Comments)    Bleeding from vagina   . Sulfamethoxazole-Trimethoprim Itching    The results of significant diagnostics from this hospitalization (including imaging, microbiology, ancillary and laboratory) are listed below for reference.    Significant Diagnostic Studies: DG Chest Port 1 View  Result Date: 09/22/2020 CLINICAL DATA:  79 year old female with  shortness  of breath. EXAM: PORTABLE CHEST 1 VIEW COMPARISON:  Chest radiograph dated 12/19/2019. FINDINGS: Bibasilar streaky densities may represent atelectasis or infiltrate. No pleural effusion pneumothorax. Mild cardiomegaly. No acute osseous pathology. IMPRESSION: Bibasilar streaky densities may represent atelectasis or infiltrate. Electronically Signed   By: Anner Crete M.D.   On: 09/22/2020 17:43   ECHOCARDIOGRAM COMPLETE  Result Date: 09/23/2020    ECHOCARDIOGRAM REPORT   Patient Name:   [name] [facility] Date of Exam: 09/23/2020 Medical Rec #:  [id number]        Height:       63.5 in Accession #:    [id number]       Weight:       276.7 lb Date of Birth:  [birth year]       BSA:          2.233 m Patient Age:    79 years         BP:           166/63 mmHg Patient Gender: F                HR:           77 bpm. Exam Location:  Inpatient Procedure: 2D Echo, Cardiac Doppler and Color Doppler Indications:    D62.22 Acute diastolic (congestive) heart failure  History:        Patient has prior history of Echocardiogram examinations, most                 recent 07/26/2017. Signs/Symptoms:Altered Mental Status.  Sonographer:    Merrie Roof RDCS Referring Phys: [place]  1. Left ventricular ejection fraction, by estimation, is 65 to 70%. The left ventricle has normal function. The left ventricle has no regional wall motion abnormalities. There is mild left ventricular hypertrophy. Left ventricular diastolic parameters are consistent with Grade I diastolic dysfunction (impaired relaxation).  2. Right ventricular systolic function is normal. The right ventricular size is normal. There is mildly elevated pulmonary artery systolic pressure.  3. The mitral valve is normal in structure. No evidence of mitral valve regurgitation. No evidence of mitral stenosis.  4. The aortic valve is normal in structure. Aortic valve regurgitation is not visualized. No aortic stenosis is present.  5. The  inferior vena cava is normal in size with greater than 50% respiratory variability, suggesting right atrial pressure of 3 mmHg. FINDINGS  Left Ventricle: Left ventricular ejection fraction, by estimation, is 65 to 70%. The left ventricle has normal function. The left ventricle has no regional wall motion abnormalities. The left ventricular internal cavity size was normal in size. There is  mild left ventricular hypertrophy. Left ventricular diastolic parameters are consistent with Grade I diastolic dysfunction (impaired relaxation). Right Ventricle: The right ventricular size is normal. No increase in right ventricular wall thickness. Right ventricular systolic function is normal. There is mildly elevated pulmonary artery systolic pressure. The tricuspid regurgitant velocity is 2.78  m/s, and with an assumed right atrial pressure of 8 mmHg, the estimated right ventricular systolic pressure is 97.9 mmHg. Left Atrium: Left atrial size was normal in size. Right Atrium: Right atrial size was normal in size. Pericardium: There is no evidence of pericardial effusion. Mitral Valve: The mitral valve is normal in structure. No evidence of mitral valve regurgitation. No evidence of mitral valve stenosis. Tricuspid Valve: The tricuspid valve is normal in structure. Tricuspid valve regurgitation is not demonstrated. No evidence of tricuspid stenosis. Aortic Valve: The aortic valve is  normal in structure. Aortic valve regurgitation is not visualized. No aortic stenosis is present. Pulmonic Valve: The pulmonic valve was normal in structure. Pulmonic valve regurgitation is not visualized. No evidence of pulmonic stenosis. Aorta: The aortic root is normal in size and structure. Venous: The inferior vena cava is normal in size with greater than 50% respiratory variability, suggesting right atrial pressure of 3 mmHg. IAS/Shunts: No atrial level shunt detected by color flow Doppler.  LEFT VENTRICLE PLAX 2D LVIDd:         3.00 cm       Diastology LVIDs:         2.30 cm      LV e' medial:    9.57 cm/s LV PW:         1.30 cm      LV E/e' medial:  10.7 LV IVS:        1.30 cm      LV e' lateral:   10.70 cm/s LVOT diam:     2.10 cm      LV E/e' lateral: 9.5 LV SV:         90 LV SV Index:   40 LVOT Area:     3.46 cm  LV Volumes (MOD) LV vol d, MOD A4C: 101.0 ml LV vol s, MOD A4C: 45.4 ml LV SV MOD A4C:     101.0 ml RIGHT VENTRICLE RV Basal diam:  3.60 cm LEFT ATRIUM           Index       RIGHT ATRIUM           Index LA diam:      2.70 cm 1.21 cm/m  RA Area:     14.80 cm LA Vol (A4C): 79.9 ml 35.79 ml/m RA Volume:   37.50 ml  16.80 ml/m  AORTIC VALVE LVOT Vmax:   127.00 cm/s LVOT Vmean:  91.700 cm/s LVOT VTI:    0.260 m  AORTA Ao Root diam: 2.70 cm MITRAL VALVE                TRICUSPID VALVE MV Area (PHT): 5.31 cm     TR Peak grad:   30.9 mmHg MV Decel Time: 143 msec     TR Vmax:        278.00 cm/s MV E velocity: 102.00 cm/s MV A velocity: 115.00 cm/s  SHUNTS MV E/A ratio:  0.89         Systemic VTI:  0.26 m                             Systemic Diam: 2.10 cm Candee Furbish MD Electronically signed by Candee Furbish MD Signature Date/Time: 09/23/2020/2:30:33 PM    Final     Microbiology: Recent Results (from the past 240 hour(s))  Resp Panel by RT-PCR (Flu A&B, Covid) Nasopharyngeal Swab     Status: None   Collection Time: 09/22/20  5:53 PM   Specimen: Nasopharyngeal Swab; Nasopharyngeal(NP) swabs in vial transport medium  Result Value Ref Range Status   SARS Coronavirus 2 by RT PCR NEGATIVE NEGATIVE Final    Comment: (NOTE) SARS-CoV-2 target nucleic acids are NOT DETECTED.  The SARS-CoV-2 RNA is generally detectable in upper respiratory specimens during the acute phase of infection. The lowest concentration of SARS-CoV-2 viral copies this assay can detect is 138 copies/mL. A negative result does not preclude SARS-Cov-2 infection and should not be used as the sole basis  for treatment or other patient management decisions. A negative  result may occur with  improper specimen collection/handling, submission of specimen other than nasopharyngeal swab, presence of viral mutation(s) within the areas targeted by this assay, and inadequate number of viral copies(<138 copies/mL). A negative result must be combined with clinical observations, patient history, and epidemiological information. The expected result is Negative.  Fact Sheet for Patients:  EntrepreneurPulse.com.au  Fact Sheet for Healthcare Providers:  IncredibleEmployment.be  This test is no t yet approved or cleared by the [place] FDA and  has been authorized for detection and/or diagnosis of SARS-CoV-2 by FDA under an Emergency Use Authorization (EUA). This EUA will remain  in effect (meaning this test can be used) for the duration of the COVID-19 declaration under Section 564(b)(1) of the Act, 21 U.S.C.section 360bbb-3(b)(1), unless the authorization is terminated  or revoked sooner.       Influenza A by PCR NEGATIVE NEGATIVE Final   Influenza B by PCR NEGATIVE NEGATIVE Final    Comment: (NOTE) The Xpert Xpress SARS-CoV-2/FLU/RSV plus assay is intended as an aid in the diagnosis of influenza from Nasopharyngeal swab specimens and should not be used as a sole basis for treatment. Nasal washings and aspirates are unacceptable for Xpert Xpress SARS-CoV-2/FLU/RSV testing.  Fact Sheet for Patients: EntrepreneurPulse.com.au  Fact Sheet for Healthcare Providers: IncredibleEmployment.be  This test is not yet approved or cleared by the [place] FDA and has been authorized for detection and/or diagnosis of SARS-CoV-2 by FDA under an Emergency Use Authorization (EUA). This EUA will remain in effect (meaning this test can be used) for the duration of the COVID-19 declaration under Section 564(b)(1) of the Act, 21 U.S.C. section 360bbb-3(b)(1), unless the authorization is  terminated or revoked.  Performed at [facility], [place] [address] [place], [place] [address]   MRSA PCR Screening     Status: None   Collection Time: 09/22/20 10:50 PM   Specimen: Nasopharyngeal  Result Value Ref Range Status   MRSA by PCR NEGATIVE NEGATIVE Final    Comment:        The GeneXpert MRSA Assay (FDA approved for NASAL specimens only), is one component of a comprehensive MRSA colonization surveillance program. It is not intended to diagnose MRSA infection nor to guide or monitor treatment for MRSA infections. Performed at [facility], [place] [address], [place], [place] [address]      Labs: Basic Metabolic Panel: Recent Labs  Lab 09/22/20 1528 09/22/20 1747 09/23/20 0051 09/24/20 0038 09/25/20 0120  NA 142 139 141 138 140  K 4.6 5.1 4.3 4.8 4.5  CL 107 104 108 106 103  CO2 21 24 23  21* 24  GLUCOSE 106* 126* 208* 187* 145*  BUN 20 18 19  31* 41*  CREATININE 1.43* 1.61* 1.55* 1.77* 1.77*  CALCIUM 9.6 9.4 9.2 8.9 8.8*  PHOS 4.5*  --   --   --   --    Liver Function Tests: Recent Labs  Lab 09/22/20 1747 09/23/20 0051  AST 20 21  ALT 21 21  ALKPHOS 95 84  BILITOT 0.3 0.4  PROT 7.1 6.7  ALBUMIN 3.6 3.3*   No results for input(s): LIPASE, AMYLASE in the last 168 hours. No results for input(s): AMMONIA in the last 168 hours. CBC: Recent Labs  Lab 09/22/20 1747 09/23/20 0051  WBC 10.1 8.1  NEUTROABS 7.5  --   HGB 11.8* 11.0*  HCT 38.5 33.1*  MCV 99.0 93.8  PLT 262 247  Cardiac Enzymes: No results for input(s): CKTOTAL, CKMB, CKMBINDEX, TROPONINI in the last 168 hours. BNP: BNP (last 3 results) Recent Labs    09/22/20 1747  BNP 35.2    ProBNP (last 3 results) No results for input(s): PROBNP in the last 8760 hours.  CBG: No results for input(s): GLUCAP in the last 168 hours.  Principal Problem:   Acute on chronic diastolic CHF (congestive heart failure) (HCC) Active Problems:   Schizophrenia, chronic condition (HCC)    Chronic kidney disease (CKD), stage III (moderate) (HCC)   OSA (obstructive sleep apnea)   Acute CHF (congestive heart failure) ([place])   Time coordinating discharge: 38 minutes.  Signed:        Adalena Abdulla, DO [facility]  09/25/2020, 1:41 PM

## 2020-09-25 NOTE — Discharge Instructions (Signed)
Chronic Obstructive Pulmonary Disease Chronic obstructive pulmonary disease (COPD) is a long-term (chronic) lung problem. When you have COPD, it is hard for air to get in and out of your lungs. Usually the condition gets worse over time, and your lungs will never return to normal. There are things you can do to keep yourself as healthy as possible.  Your doctor may treat your condition with: ? Medicines. ? Oxygen. ? Lung surgery.  Your doctor may also recommend: ? Rehabilitation. This includes steps to make your body work better. It may involve a team of specialists. ? Quitting smoking, if you smoke. ? Exercise and changes to your diet. ? Comfort measures (palliative care). Follow these instructions at home: Medicines  Take over-the-counter and prescription medicines only as told by your doctor.  Talk to your doctor before taking any cough or allergy medicines. You may need to avoid medicines that cause your lungs to be dry. Lifestyle  If you smoke, stop. Smoking makes the problem worse. If you need help quitting, ask your doctor.  Avoid being around things that make your breathing worse. This may include smoke, chemicals, and fumes.  Stay active, but remember to rest as well.  Learn and use tips on how to relax.  Make sure you get enough sleep. Most adults need at least 7 hours of sleep every night.  Eat healthy foods. Eat smaller meals more often. Rest before meals. Controlled breathing Learn and use tips on how to control your breathing as told by your doctor. Try:  Breathing in (inhaling) through your nose for 1 second. Then, pucker your lips and breath out (exhale) through your lips for 2 seconds.  Putting one hand on your belly (abdomen). Breathe in slowly through your nose for 1 second. Your hand on your belly should move out. Pucker your lips and breathe out slowly through your lips. Your hand on your belly should move in as you breathe out.  Controlled coughing Learn  and use controlled coughing to clear mucus from your lungs. Follow these steps: 1. Lean your head a little forward. 2. Breathe in deeply. 3. Try to hold your breath for 3 seconds. 4. Keep your mouth slightly open while coughing 2 times. 5. Spit any mucus out into a tissue. 6. Rest and do the steps again 1 or 2 times as needed. General instructions  Make sure you get all the shots (vaccines) that your doctor recommends. Ask your doctor about a flu shot and a pneumonia shot.  Use oxygen therapy and pulmonary rehabilitation if told by your doctor. If you need home oxygen therapy, ask your doctor if you should buy a tool to measure your oxygen level (oximeter).  Make a COPD action plan with your doctor. This helps you to know what to do if you feel worse than usual.  Manage any other conditions you have as told by your doctor.  Avoid going outside when it is very hot, cold, or humid.  Avoid people who have a sickness you can catch (contagious).  Keep all follow-up visits as told by your doctor. This is important. Contact a doctor if:  You cough up more mucus than usual.  There is a change in the color or thickness of the mucus.  It is harder to breathe than usual.  Your breathing is faster than usual.  You have trouble sleeping.  You need to use your medicines more often than usual.  You have trouble doing your normal activities such as getting dressed   or walking around the house. Get help right away if:  You have shortness of breath while resting.  You have shortness of breath that stops you from: ? Being able to talk. ? Doing normal activities.  Your chest hurts for longer than 5 minutes.  Your skin color is more blue than usual.  Your pulse oximeter shows that you have low oxygen for longer than 5 minutes.  You have a fever.  You feel too tired to breathe normally. Summary  Chronic obstructive pulmonary disease (COPD) is a long-term lung problem.  The way your  lungs work will never return to normal. Usually the condition gets worse over time. There are things you can do to keep yourself as healthy as possible.  Take over-the-counter and prescription medicines only as told by your doctor.  If you smoke, stop. Smoking makes the problem worse. This information is not intended to replace advice given to you by your health care provider. Make sure you discuss any questions you have with your health care provider. Document Revised: 09/28/2017 Document Reviewed: 11/20/2016 Elsevier Patient Education  2020 [place] for Chronic Obstructive Pulmonary Disease Chronic obstructive pulmonary disease (COPD) causes symptoms such as shortness of breath, coughing, and chest discomfort. These symptoms can make it difficult to eat enough to maintain a healthy weight. Generally, people with COPD should eat a diet that is high in calories, protein, and other nutrients to maintain body weight and to keep the lungs as healthy as possible. Depending on the medicines you take and other health conditions you may have, your health care provider may give you additional recommendations on what to eat or avoid. Talk with your health care provider about your goals for body weight, and work with a dietitian to develop an eating plan that is right for you. What are tips for following this plan? Reading food labels   Avoid foods with more than 300 milligrams (mg) of salt (sodium) per serving.  Choose foods that contain at least 4 grams (g) of fiber per serving. Try to eat 20-30 g of fiber each day.  Choose foods that are high in calories and protein, such as nuts, beans, yogurt, and cheese. Shopping  Do not buy foods labeled as diet, low-calorie, or low-fat.  If you are able to eat dairy products: ? Avoid low-fat or skim milk. ? Buy dairy products that have at least 2% fat.  Buy nutritional supplement drinks.  Buy grains and prepared foods labeled as  enriched or fortified.  Consider buying low-sodium, pre-made foods to conserve energy for eating. Cooking  Add dry milk or protein powder to smoothies.  Cook with healthy fats, such as olive oil, canola oil, sunflower oil, and grapeseed oil.  Add oil, butter, cream cheese, or nut butters to foods to increase fat and calories.  To make foods easier to chew and swallow: ? Cook vegetables, pasta, and rice until soft. ? Cut or grind meat into very small pieces. ? Dip breads in liquid. Meal planning   Eat when you feel hungry.  Eat 5-6 small meals throughout the day.  Drink 6-8 glasses of water each day.  Do not drink liquids with meals. Drink liquids at the end of the meal to avoid feeling full too quickly.  Eat a variety of fruits and vegetables every day.  Ask for assistance from family or friends with planning and preparing meals as needed.  Avoid foods that cause you to feel bloated, such as  carbonated drinks, fried foods, beans, broccoli, cabbage, and apples.  For older adults, ask your local agency on aging whether you are eligible for meal assistance programs, such as Meals on Wheels. Lifestyle   Do not smoke.  Eat slowly. Take small bites and chew food well before swallowing.  Do not overeat. This may make it more difficult to breathe after eating.  Sit up while eating.  If needed, continue to use supplemental oxygen while eating.  Rest or relax for 30 minutes before and after eating.  Monitor your weight as told by your health care provider.  Exercise as told by your health care provider. What foods can I eat? Fruits All fresh, dried, canned, or frozen fruits that do not cause gas. Vegetables All fresh, canned (no salt added), or frozen vegetables that do not cause gas. Grains Whole grain bread. Enriched whole grain pasta. Fortified whole grain cereals. Fortified rice. Quinoa. Meats and other proteins Lean meat. Poultry. Fish. Dried beans. Unsalted  nuts. Tofu. Eggs. Nut butters. Dairy Whole or 2% milk. Cheese. Yogurt. Fats and oils Olive oil. Canola oil. Butter. Margarine. Beverages Water. Vegetable juice (no salt added). Decaffeinated coffee. Decaffeinated or herbal tea. Seasonings and condiments Fresh or dried herbs. Low-salt or salt-free seasonings. Low-sodium soy sauce. The items listed above may not be a complete list of foods and beverages you can eat. Contact a dietitian for more information. What foods are not recommended? Fruits Fruits that cause gas, such as apples or melon. Vegetables Vegetables that cause gas, such as broccoli, Brussels sprouts, cabbage, cauliflower, and onions. Canned vegetables with added salt. Meats and other proteins Fried meat. Salt-cured meat. Processed meat. Dairy Fat-free or low-fat milk, yogurt, or cheese. Processed cheese. Beverages Carbonated drinks. Caffeinated drinks, such as coffee, tea, and soft drinks. Juice. Alcohol. Vegetable juice with added salt. Seasonings and condiments Salt. Seasoning mixes with salt. Soy sauce. [name]. Other foods Clear soup or broth. Fried foods. Prepared frozen meals. The items listed above may not be a complete list of foods and beverages you should avoid. Contact a dietitian for more information. Summary  COPD symptoms can make it difficult to eat enough to maintain a healthy weight.  A COPD eating plan can help you maintain your body weight and keep your lungs as healthy as possible.  Eat a diet that is high in calories, protein, and other nutrients. Read labels to make sure that you are getting the right nutrients. Cook foods to make them easy to chew and swallow.  Eat 5-6 small meals throughout the day, and avoid foods that cause gas or make you feel bloated. This information is not intended to replace advice given to you by your health care provider. Make sure you discuss any questions you have with your health care provider. Document Revised:  02/06/2019 Document Reviewed: 01/01/2018 Elsevier Patient Education  [place].   Chronic Obstructive Pulmonary Disease Exacerbation Chronic obstructive pulmonary disease (COPD) is a long-term (chronic) lung problem. In COPD, the flow of air from the lungs is limited. COPD exacerbations are times that breathing gets worse and you need more than your normal treatment. Without treatment, they can be life threatening. If they happen often, your lungs can become more damaged. If your COPD gets worse, your doctor may treat you with:  Medicines.  Oxygen.  Different ways to clear your airway, such as using a mask. Follow these instructions at home: Medicines  Take over-the-counter and prescription medicines only as told by your doctor.  If you take an antibiotic or steroid medicine, do not stop taking the medicine even if you start to feel better.  Keep up with shots (vaccinations) as told by your doctor. Be sure to get a yearly (annual) flu shot. Lifestyle  Do not smoke. If you need help quitting, ask your doctor.  Eat healthy foods.  Exercise regularly.  Get plenty of sleep.  Avoid tobacco smoke and other things that can bother your lungs.  Wash your hands often with soap and water. This will help keep you from getting an infection. If you cannot use soap and water, use hand sanitizer.  During flu season, avoid areas that are crowded with people. General instructions  Drink enough fluid to keep your pee (urine) clear or pale yellow. Do not do this if your doctor has told you not to.  Use a cool mist machine (vaporizer).  If you use oxygen or a machine that turns medicine into a mist (nebulizer), continue to use it as told.  Follow all instructions for rehabilitation. These are steps you can take to make your body work better.  Keep all follow-up visits as told by your doctor. This is important. Contact a doctor if:  Your COPD symptoms get worse than normal. Get  help right away if:  You are short of breath and it gets worse.  You have trouble talking.  You have chest pain.  You cough up blood.  You have a fever.  You keep throwing up (vomiting).  You feel weak or you pass out (faint).  You feel confused.  You are not able to sleep because of your symptoms.  You are not able to do daily activities. Summary  COPD exacerbations are times that breathing gets worse and you need more treatment than normal.  COPD exacerbations can be very serious and may cause your lungs to become more damaged.  Do not smoke. If you need help quitting, ask your doctor.  Stay up-to-date on your shots. Get a flu shot every year. This information is not intended to replace advice given to you by your health care provider. Make sure you discuss any questions you have with your health care provider. Document Revised: 09/28/2017 Document Reviewed: 11/20/2016 Elsevier Patient Education  2020 [employer].

## 2020-09-26 DIAGNOSIS — R079 Chest pain, unspecified: Secondary | ICD-10-CM | POA: Diagnosis not present

## 2020-09-26 DIAGNOSIS — R062 Wheezing: Secondary | ICD-10-CM | POA: Diagnosis not present

## 2020-09-26 DIAGNOSIS — R0789 Other chest pain: Secondary | ICD-10-CM | POA: Diagnosis not present

## 2020-09-26 DIAGNOSIS — J80 Acute respiratory distress syndrome: Secondary | ICD-10-CM | POA: Diagnosis not present

## 2020-09-26 DIAGNOSIS — R0602 Shortness of breath: Secondary | ICD-10-CM | POA: Diagnosis not present

## 2020-09-27 ENCOUNTER — Telehealth: Payer: Self-pay

## 2020-09-27 NOTE — Telephone Encounter (Signed)
Pt scheduled  

## 2020-09-27 NOTE — Progress Notes (Signed)
Please inform patient of the following:  Labs are all stable.  [name]. [name], MD 09/27/2020 1:08 PM

## 2020-09-27 NOTE — Telephone Encounter (Signed)
While on the phone with pts daughter notifying them of lab results, she wanted to schedule a virtual visit this week with Dr [name] for a hospital f/u. Please reach out to schedule.  Thank you

## 2020-09-27 NOTE — Telephone Encounter (Cosign Needed)
Transition Care Management Follow-up Telephone Call  Date of discharge and from where: 09/25/20 from [facility] hospital  How have you been since you were released from the hospital? [facility] but still having trouble breathing at times  Any questions or concerns? Yes questions about Oxygen and taking the lasix.  Items Reviewed:  Did the pt receive and understand the discharge instructions provided? Yes   Medications obtained and verified? Yes   Other? No   Any new allergies since your discharge? No   Dietary orders reviewed? Yes  Do you have support at home? Yes  Daughter [facility] and Equipment/Supplies: Were home health services ordered? Yes nebulizer treatments only per pt daughter  Functional Questionnaire: (I = Independent and D = Dependent) ADLs: D  Bathing/Dressing- D  Meal Prep- D  Eating- D  Maintaining continence- D  Transferring/Ambulation- D  Managing Meds- D  Follow up appointments reviewed:   PCP Hospital f/u appt confirmed? Yes  Scheduled to see Dr [name] on 09/30/20 @ 11:40  Are transportation arrangements needed? No   If their condition worsens, is the pt aware to call PCP or go to the Emergency Dept.? Yes  Was the patient provided with contact information for the PCP's office or ED? Yes  Was to pt encouraged to call back with questions or concerns? Yes

## 2020-09-28 ENCOUNTER — Other Ambulatory Visit: Payer: Self-pay | Admitting: *Deleted

## 2020-09-28 NOTE — Patient Outreach (Signed)
[facility]) Care Management  09/28/2020  [name] [birth year] [id number]   EMMI Red Flag Alert   Day #1 Date: 09/27/20 Red Alert Reason : Know who to call about changes in condition?, No    Outreach Attempt #1 Subjective Unsuccessful outreach call to patient , daughter [name], designated party release , no answer able to leave a HIPAA compliant voice message for return call   Plan Will plan return in the next 4 business days.  Will send unsuccessful patient  outreach letter    [name], RN, BSN  [facility] Management Coordinator  [phone]- Mobile [phone]- [place]

## 2020-09-29 ENCOUNTER — Telehealth: Payer: Self-pay | Admitting: *Deleted

## 2020-09-29 ENCOUNTER — Encounter: Payer: Self-pay | Admitting: Physician Assistant

## 2020-09-29 ENCOUNTER — Telehealth: Payer: Self-pay | Admitting: Physician Assistant

## 2020-09-29 ENCOUNTER — Telehealth (INDEPENDENT_AMBULATORY_CARE_PROVIDER_SITE_OTHER): Payer: Medicare Other | Admitting: Physician Assistant

## 2020-09-29 ENCOUNTER — Other Ambulatory Visit: Payer: Self-pay

## 2020-09-29 VITALS — Ht 63.5 in | Wt 274.9 lb

## 2020-09-29 DIAGNOSIS — I5033 Acute on chronic diastolic (congestive) heart failure: Secondary | ICD-10-CM

## 2020-09-29 DIAGNOSIS — I10 Essential (primary) hypertension: Secondary | ICD-10-CM

## 2020-09-29 DIAGNOSIS — N1832 Chronic kidney disease, stage 3b: Secondary | ICD-10-CM

## 2020-09-29 MED ORDER — FUROSEMIDE 20 MG PO TABS: 40.0000 mg | ORAL_TABLET | Freq: Every day | ORAL | 1 refills | Status: DC

## 2020-09-29 NOTE — Telephone Encounter (Signed)
°  Patient Consent for Virtual Visit         [name] has provided verbal consent on 09/29/2020 for a virtual visit (video or telephone).   CONSENT FOR VIRTUAL VISIT FOR:  [name]  By participating in this virtual visit I agree to the following:  I hereby voluntarily request, consent and authorize [facility] and its employed or contracted physicians, physician assistants, nurse practitioners or other licensed health care professionals (the Practitioner), to provide me with telemedicine health care services (the Services") as deemed necessary by the treating Practitioner. I acknowledge and consent to receive the Services by the Practitioner via telemedicine. I understand that the telemedicine visit will involve communicating with the Practitioner through live audiovisual communication technology and the disclosure of certain medical information by electronic transmission. I acknowledge that I have been given the opportunity to request an in-person assessment or other available alternative prior to the telemedicine visit and am voluntarily participating in the telemedicine visit.  I understand that I have the right to withhold or withdraw my consent to the use of telemedicine in the course of my care at any time, without affecting my right to future care or treatment, and that the Practitioner or I may terminate the telemedicine visit at any time. I understand that I have the right to inspect all information obtained and/or recorded in the course of the telemedicine visit and may receive copies of available information for a reasonable fee.  I understand that some of the potential risks of receiving the Services via telemedicine include:   Delay or interruption in medical evaluation due to technological equipment failure or disruption;  Information transmitted may not be sufficient (e.g. poor resolution of images) to allow for appropriate medical decision making by the  Practitioner; and/or   In rare instances, security protocols could fail, causing a breach of personal health information.  Furthermore, I acknowledge that it is my responsibility to provide information about my medical history, conditions and care that is complete and accurate to the best of my ability. I acknowledge that Practitioner's advice, recommendations, and/or decision may be based on factors not within their control, such as incomplete or inaccurate data provided by me or distortions of diagnostic images or specimens that may result from electronic transmissions. I understand that the practice of medicine is not an exact science and that Practitioner makes no warranties or guarantees regarding treatment outcomes. I acknowledge that a copy of this consent can be made available to me via my patient portal ([place]), or I can request a printed copy by calling the office of [facility].    I understand that my insurance will be billed for this visit.   I have read or had this consent read to me.  I understand the contents of this consent, which adequately explains the benefits and risks of the Services being provided via telemedicine.   I have been provided ample opportunity to ask questions regarding this consent and the Services and have had my questions answered to my satisfaction.  I give my informed consent for the services to be provided through the use of telemedicine in my medical care

## 2020-09-29 NOTE — Telephone Encounter (Signed)
Spoke to daughter [name] she advised that she was not able to talk at moment to get patient [name] to update info. for her virtual visit and she would have to call us back when available

## 2020-09-29 NOTE — Progress Notes (Signed)
Virtual Visit via Telephone Note   This visit type was conducted due to national recommendations for restrictions regarding the COVID-19 Pandemic (e.g. social distancing) in an effort to limit this patient's exposure and mitigate transmission in our community.  Due to her co-morbid illnesses, this patient is at least at moderate risk for complications without adequate follow up.  This format is felt to be most appropriate for this patient at this time.  The patient did not have access to video technology/had technical difficulties with video requiring transitioning to audio format only (telephone).  All issues noted in this document were discussed and addressed.  No physical exam could be performed with this format.  Please refer to the patient's chart for her  consent to telehealth for [facility].    Date:  09/29/2020   ID:  [name], DOB April 16, 1941, MRN [id number] The patient was identified using 2 identifiers.  Patient Location: Home Provider Location: Home Office  PCP:  Shelby Barrack, MD  Cardiologist:  Shelby Bergeron, MD  Electrophysiologist:  None   Evaluation Performed:  Follow-Up Visit  Chief Complaint:  Hospitalization Follow-up (CHF)    Patient Profile: [name] is a 79 y.o. female with:  Heart failure with preserved ejection fraction   Chronic kidney disease   Dementia   Anxiety  ?Schizophrenia  Hx of TIA  Hypertension   Hyperlipidemia   Venous insuff  Ovarian CA s/p oophorectomy   Gout   Ex-smoker  Prior CV Studies: Echocardiogram 09/23/2020 EF 65-70, no RWMA, mild LVH, GR 1 DD, normal RVSF, mildly elevated PASP, RVSP 38.9   History of Present Illness:   [name] was admitted from the office 11/24 with hypoxic respiratory failure in the setting of decompensated HFpEF.  Her oxygen saturations were in the 80s prior to transfer to the hospital.  There was some concern for pneumonia but her procalcitonin returned normal  and antibiotics were stopped.  She did complete a steroid taper.  She had good response to IV diuresis.  Discharge notes indicate the family requested that her daily furosemide be stopped at discharge.  Notes indicate she was advised to take furosemide as needed.  She is seen for follow-up   Her daughter, [name], provides the history.  DPR is on file.  The patient has continued to have issues with shortness of breath since discharge.  Overall, her breathing is improved since admission.  Her weight has been fairly stable since discharge.  Her leg edema is improved since admission to the hospital.  However, she continues to have significant leg edema.  She does have discomfort in her chest at night.  She sleeps on an incline.  She continues having issues with wheezing and cough.  Her daughter asked about the possibility to get oxygen as well as inhalers.  She is currently using nebulizers.  She contacted EMS several nights ago due to worsening shortness of breath.  However, her evaluation was stable and she was not brought to the hospital.   Past Medical History:  Diagnosis Date  . Chronic venous insufficiency   . Colon, diverticulosis Sept. 2011   outpatient colonoscopy by Dr. Cristina Gong.  Need record  . DDD (degenerative disc disease), lumbosacral   . Depression   . DJD (degenerative joint disease), multiple sites    Low back pain worst  . GERD (gastroesophageal reflux disease)   . Gout   . Gout    Uric Acid level 4.2 on 300 allopurinol  .  History of cervical cancer 1972  . Hx-TIA (transient ischemic attack) 05/13/2001   Right facial numbness  . Hx-TIA (transient ischemic attack) 05/13/2001   Looking back in Epic, the patient had a hospitalization in July 2002 for evaluation of questionable TIA (s/s of right facial numbness associated with mild blurred vision in her right eye, diaphoresis, and mild confusion) with negative CT head and negative MRI/MRA and was started on Plavix after being  heparinized. She was to have possible follow up with Neurology for reevaluation of the need for Pl  . Hyperlipidemia   . Hypertension   . Keloid 08/01/2018  . Memory disorder 12/20/2016  . Mitral valve prolapse 10/27/1999   Subsequent 2D echo show normal mitral valve  . Osteoarthritis of hip    bilateral hips  . Ovarian cancer ([place]) 1972   S/P oophorectomy  . Psychosis ([place])   . Recurrent boils    History of MRSA skin infections with abscess  . Sensorineural hearing loss of both ears   . Subdural hematoma ([place]) 01/26/2017   bilateral  . Uncomplicated opioid dependence ([place]) 01/31/2018   Past Surgical History:  Procedure Laterality Date  . ABDOMINAL HYSTERECTOMY     1972  . BURR HOLE Bilateral 02/12/2017   Procedure: Haskell Flirt;  Surgeon: Earnie Larsson, MD;  Location: [place];  Service: Neurosurgery;  Laterality: Bilateral;  . CERVICAL DISCECTOMY  7/07   C5-C6  . JOINT REPLACEMENT     bilateral hip replacement  . LUMBAR DISC SURGERY     L5-S1 7/07  . LUMBAR LAMINECTOMY/DECOMPRESSION MICRODISCECTOMY N/A 02/02/2016   Procedure: L4-5 Decompression;  Surgeon: Marybelle Killings, MD;  Location: [place];  Service: Orthopedics;  Laterality: N/A;  . [place] for ovarian cancer     Current Meds  Medication Sig  . acetaminophen (TYLENOL) 500 MG tablet Take 500 mg by mouth at bedtime.   [name] allopurinol (ZYLOPRIM) 300 MG tablet TAKE 1 TABLET BY MOUTH  DAILY  . amLODipine (NORVASC) 10 MG tablet Take 1 tablet (10 mg total) by mouth daily.  [name] ascorbic acid (VITAMIN C) 500 MG tablet Take 500 mg by mouth daily.   [name] atorvastatin (LIPITOR) 40 MG tablet TAKE 1 TABLET BY MOUTH  DAILY  . benzonatate (TESSALON) 200 MG capsule Take 1 capsule (200 mg total) by mouth 3 (three) times daily as needed for cough.  . budesonide (PULMICORT) 0.25 MG/2ML nebulizer solution Take 2 mLs (0.25 mg total) by nebulization 2 (two) times daily.  [name] buPROPion (WELLBUTRIN XL) 300 MG 24 hr tablet Take 1 tablet (300 mg total) by  mouth every morning.  . carvedilol (COREG) 3.125 MG tablet Take 1 tablet (3.125 mg total) by mouth 2 (two) times daily.  . Cyanocobalamin (VITAMIN B12) 1000 MCG TBCR Take 1 tablet by mouth daily.  . diclofenac Sodium (VOLTAREN) 1 % GEL Apply 4 g topically 4 (four) times daily as needed (for pain).  . fluticasone (FLONASE) 50 MCG/ACT nasal spray Place into both nostrils as needed for allergies or rhinitis.  . haloperidol (HALDOL) 0.5 MG tablet Take 0.5 mg by mouth 2 (two) times daily.  . hydrOXYzine (VISTARIL) 25 MG capsule TAKE 1 CAPSULE BY MOUTH AT  BEDTIME  . ipratropium-albuterol (DUONEB) 0.5-2.5 (3) MG/3ML SOLN Take 3 mLs by nebulization 2 (two) times daily.  [name] losartan (COZAAR) 100 MG tablet Take 1 tablet (100 mg total) by mouth daily.  . pantoprazole (PROTONIX) 40 MG tablet Take 1 tablet once daily.  [name]  predniSONE (DELTASONE) 20 MG tablet Take 3 tablets (60 mg total) by mouth daily for 3 days, THEN 2.5 tablets (50 mg total) daily for 3 days, THEN 2 tablets (40 mg total) daily for 3 days, THEN 1.5 tablets (30 mg total) daily for 3 days, THEN 1 tablet (20 mg total) daily for 3 days, THEN 0.5 tablets (10 mg total) daily for 3 days.  . traMADol (ULTRAM) 50 MG tablet TAKE 1 TABLET BY MOUTH  TWICE DAILY AS NEEDED     Allergies:   Aspirin and Sulfamethoxazole-trimethoprim   Social History   Tobacco Use  . Smoking status: Former Smoker    Packs/day: 0.10    Years: 35.00    Pack years: 3.50    Types: Cigarettes    Quit date: 04/08/2013    Years since quitting: 7.4  . Smokeless tobacco: Never Used  . Tobacco comment: smoked a few when her son died in 2013-02-04   Vaping Use  . Vaping Use: Never used  Substance Use Topics  . Alcohol use: No    Alcohol/week: 0.0 standard drinks  . Drug use: No     Family Hx: The patient's family history includes Diabetes in her maternal grandfather and mother; Hearing loss in her mother; Heart disease in her mother; Other in her brother; Post-traumatic stress  disorder in her father; Schizophrenia in her maternal grandmother; Stroke in her mother. There is no history of Kidney cancer or Bladder Cancer.  ROS:   Please see the history of present illness.     Labs/Other Tests and Data Reviewed:    EKG:  No ECG reviewed.  Recent Labs: 08/30/2020: TSH 4.62 09/22/2020: B Natriuretic Peptide 35.2 09/23/2020: ALT 21; Hemoglobin 11.0; Platelets 247 09/25/2020: BUN 41; Creatinine, Ser 1.77; Potassium 4.5; Sodium 140   Recent Lipid Panel Lab Results  Component Value Date/Time   CHOL 212 (H) 08/30/2020 03:46 PM   TRIG 136 08/30/2020 03:46 PM   HDL 49 (L) 08/30/2020 03:46 PM   CHOLHDL 4.3 08/30/2020 03:46 PM   LDLCALC 137 (H) 08/30/2020 03:46 PM    Wt Readings from Last 3 Encounters:  09/29/20 274 lb 14.4 oz (124.7 kg)  09/25/20 277 lb 5.4 oz (125.8 kg)  09/22/20 284 lb (128.8 kg)     Risk Assessment/Calculations:      Objective:    Vital Signs:  Ht 5' 3.5" (1.613 m)   Wt 274 lb 14.4 oz (124.7 kg)   BMI 47.93 kg/m    VITAL SIGNS:  reviewed  ASSESSMENT & PLAN:    1. Acute on chronic heart failure with preserved ejection fraction [facility]) She was recently admitted to the hospital with volume overload.  Her weight was down 10 pounds at discharge.  However, the patient's daughter tells me that her weight was 225 pounds a year ago.  Therefore, she seems to still be up 50 pounds.  She continues to struggle with shortness of breath and orthopnea as well as leg edema.  There are significant limitations to her evaluation with a telephone visit.  However, based upon the history, she is still significantly volume overloaded.  She was discharged to home with furosemide 20 mg as needed.  She seems to need daily diuresis.  Her daughter also notes symptoms of chest discomfort.  Question if this is related to congestive heart failure or if she may have underlying ischemic heart disease.  Her echocardiogram demonstrated normal LV function.  She had normal  high-sensitivity troponins in the hospital.  She has  dementia.  At this point, I am not certain that an ischemic evaluation is warranted.  It would likely be best to manage her medically if at all possible.  If her symptoms do not improve with adequate diuresis, we could certainly consider a nuclear stress test.  She seems to need more advanced care at home then what her daughter can provide.  Unfortunately, she was not set up for home health nursing or PT.  I think she would benefit from having [facility] manage her heart failure at home.  Of note, she has a lot of wheezing and her daughter was asking about inhaled medications and oxygen.  Her O2 improved prior to discharge and was in the upper 90s.  Therefore, she does not seem to need home oxygen at this time.  I have asked her to keep her appointment with primary care tomorrow to discuss whether or not she needs more than just nebulizers at home.  -Start furosemide 40 mg daily  -Start isosorbide 30 mg daily  -BMET 1 week  -Follow-up in the office in 1 week  -Refer to [facility]   2. Essential hypertension Her daughter notes her blood pressure is 140s/80s.  Continue current dose of carvedilol, amlodipine, losartan.  Add isosorbide as noted above.  3. Stage 3b chronic kidney disease ([place]) Her diuretic has been adjusted in the past due to worsening creatinine.  As noted, I have recommended placing her on daily furosemide 40 mg.  We will obtain a BMET in 1 week to keep a close on her renal function and potassium.     Time:   Today, I have spent 31 minutes with the patient with telehealth technology discussing the above problems.     Medication Adjustments/Labs and Tests Ordered: Current medicines are reviewed at length with the patient today.  Concerns regarding medicines are outlined above.   Tests Ordered: No orders of the defined types were placed in this encounter.   Medication Changes: Meds ordered this encounter  Medications    . furosemide (LASIX) 20 MG tablet    Sig: Take 2 tablets (40 mg total) by mouth daily.    Dispense:  180 tablet    Refill:  1    Requesting 1 year supply    Follow Up:  In Person in 1 week(s)  Signed, Richardson Dopp, PA-C  09/29/2020 4:42 PM    [facility]

## 2020-09-29 NOTE — Patient Instructions (Addendum)
Medication Instructions:  Your physician has recommended you make the following change in your medication:   1) Increase Furosemide to 40 mg, take 2 of the 20 mg tablets by mouth once a day  *If you need a refill on your cardiac medications before your next appointment, please call your pharmacy*   Lab Work: BMET in 1 week - this can be drawn at the appt with Dr. Johney Frame  If you have labs (blood work) drawn today and your tests are completely normal, you will receive your results only by: [name] MyChart Message (if you have MyChart) OR . A paper copy in the mail If you have any lab test that is abnormal or we need to change your treatment, we will call you to review the results.    Follow-Up: On 10/07/2020 at 2:40 PM with Dr. Johney Frame at [facility] on [facility].    Other We will arrange a home visit from [place] to help manage congestive heart failure.  Someone will contact you to arrange.

## 2020-09-29 NOTE — Telephone Encounter (Signed)
[name] is returning [name]'s call for [name]'s 2:45 PM virtual appt. Was unable to transfer call due to not getting a response back. Please advise.

## 2020-09-30 ENCOUNTER — Telehealth: Payer: Self-pay | Admitting: *Deleted

## 2020-09-30 ENCOUNTER — Telehealth (INDEPENDENT_AMBULATORY_CARE_PROVIDER_SITE_OTHER): Payer: Medicare Other | Admitting: Family Medicine

## 2020-09-30 ENCOUNTER — Encounter: Payer: Self-pay | Admitting: Family Medicine

## 2020-09-30 ENCOUNTER — Telehealth: Payer: Self-pay | Admitting: Cardiology

## 2020-09-30 VITALS — BP 157/77 | Temp 97.7°F | Ht 63.5 in | Wt 274.9 lb

## 2020-09-30 DIAGNOSIS — R5381 Other malaise: Secondary | ICD-10-CM

## 2020-09-30 DIAGNOSIS — I10 Essential (primary) hypertension: Secondary | ICD-10-CM | POA: Diagnosis not present

## 2020-09-30 DIAGNOSIS — I5032 Chronic diastolic (congestive) heart failure: Secondary | ICD-10-CM | POA: Diagnosis not present

## 2020-09-30 DIAGNOSIS — J45909 Unspecified asthma, uncomplicated: Secondary | ICD-10-CM

## 2020-09-30 DIAGNOSIS — N1832 Chronic kidney disease, stage 3b: Secondary | ICD-10-CM | POA: Diagnosis not present

## 2020-09-30 MED ORDER — ALBUTEROL SULFATE HFA 108 (90 BASE) MCG/ACT IN AERS: 2.0000 | INHALATION_SPRAY | Freq: Four times a day (QID) | RESPIRATORY_TRACT | 0 refills | Status: DC | PRN

## 2020-09-30 NOTE — Progress Notes (Signed)
   [name] is a 78 y.o. female who presents today for a virtual office visit.  Assessment/Plan:  Chronic Problems Addressed Today: Reactive airway disease Refill albuterol inhaler today.  Chronic diastolic heart failure (HCC) No signs of acute overload. She is currently being managed by cardiology. HAs not had much reported success with oral lasix. May benefit from another round of IV diuresis.  She will follow-up with cardiology next week.  Essential hypertension Above goal.  Coreg 3.125 mg twice daily was added on during her hospitalization.  She is also on losartan 100 mg daily and amlodipine 10mg  daily.   Chronic kidney disease (CKD), stage III (moderate) ([place]) Will place referral to nephrology.   Debility Will place referral to home health PT.      Subjective:  HPI: Patient here for hospital follow up.  She presented to the ED on 09/22/2020 with shortness of breath.  Found to have volume overload and CHF exacerbation.  There is also concern for possible community-acquired pneumonia.  She was admitted for IV diuresis and IV antibiotics.  She was found to have negative pro calcitonin level and antibiotics were stopped.  She was started on prednisone taper.  Oxygen requirement decreased over time and she was discharged home on 09/25/2020.  She had virtual visit with cardiology yesterday.  She was started on Imdur and furosemide.  She will be coming back to recheck metabolic panel.  She is also referred to [facility] which has note yet started.   PEr Patient's daughter.  Patient has been mostly well for the last few days since being home.  She has noticed a little bit of increased swelling in extremities over the last couple days.  No reported shortness of breath.  Still has some cough and wheeze.  Patient's daughter would like to have a refill on her albuterol inhaler today.  She is also interested in getting set up to see a physical therapist and nephrologist.         Objective/Observations  Physical Exam: Gen: NAD, resting comfortably Pulm: Normal work of breathing Neuro: Grossly normal, moves all extremities Psych: Normal affect and thought content  Virtual Visit via Video   I connected with [name] on 09/30/20 at 11:40 AM EST by a video enabled telemedicine application and verified that I am speaking with the correct person using two identifiers. The limitations of evaluation and management by telemedicine and the availability of in person appointments were discussed. The patient expressed understanding and agreed to proceed.   Patient location: Home Provider location: [place] Office Persons participating in the virtual visit: Myself and Patient  Time Spent: 55 minutes of total time was spent on the date of the encounter performing the following actions: chart review prior to seeing the patient including recent hospitalization and specialist visits, obtaining history, performing a medically necessary exam, counseling on the treatment plan, placing orders, and documenting in our EHR.       Algis Greenhouse. [name], MD 09/30/2020 12:37 PM

## 2020-09-30 NOTE — Assessment & Plan Note (Signed)
Will place referral to home health PT.

## 2020-09-30 NOTE — Telephone Encounter (Signed)
[place] Visit Initial Request  Date of Request ([place]):  September 30, 2020  Requesting Provider:  Liliane Drake, [facility]    Agency Requested:    [facility] Contact:  Shelby Buff, NP [place], [place] [address] Phone #:  [phone] Fax #:  [phone]  Patient Demographic Information: Name:  [name] Age:  79 y.o.   DOB:  1941/08/24  MRN:  [id number]   Address:   [address] [place] [address]   Phone Numbers:   Home Phone [phone]  Mobile [phone]     Emergency Contact Information on File:   Contact Information    Name Relation Home Work Mobile   [name] Daughter [phone]        The above family members may be contacted for information on this patient (review DPR on file):  Yes    Patient Clinical Information:  Primary Care Provider:  Vivi Barrack, MD  Primary Cardiologist:  Shelby Bergeron, MD  Primary Electrophysiologist:  None   Past Medical Hx: [name]  has a past medical history of Chronic venous insufficiency, Colon, diverticulosis (Sept. 2011), DDD (degenerative disc disease), lumbosacral, Depression, DJD (degenerative joint disease), multiple sites, GERD (gastroesophageal reflux disease), Gout, Gout, History of cervical cancer (1972), TIA (transient ischemic attack) (05/13/2001), TIA (transient ischemic attack) (05/13/2001), Hyperlipidemia, Hypertension, Keloid (08/01/2018), Memory disorder (12/20/2016), Mitral valve prolapse (10/27/1999), Osteoarthritis of hip, Ovarian cancer ([place]) (1972), Psychosis ([place]), Recurrent boils, Sensorineural hearing loss of both ears, Subdural hematoma ([place]) (8/50/2774), and Uncomplicated opioid dependence ([place]) (01/31/2018).   Allergies: She is allergic to aspirin and sulfamethoxazole-trimethoprim.   Medications: Current Outpatient Medications on File Prior to Visit  Medication Sig   acetaminophen (TYLENOL) 500 MG tablet Take  500 mg by mouth at bedtime.    allopurinol (ZYLOPRIM) 300 MG tablet TAKE 1 TABLET BY MOUTH  DAILY   amLODipine (NORVASC) 10 MG tablet Take 1 tablet (10 mg total) by mouth daily.   ascorbic acid (VITAMIN C) 500 MG tablet Take 500 mg by mouth daily.    atorvastatin (LIPITOR) 40 MG tablet TAKE 1 TABLET BY MOUTH  DAILY   benzonatate (TESSALON) 200 MG capsule Take 1 capsule (200 mg total) by mouth 3 (three) times daily as needed for cough.   budesonide (PULMICORT) 0.25 MG/2ML nebulizer solution Take 2 mLs (0.25 mg total) by nebulization 2 (two) times daily.   buPROPion (WELLBUTRIN XL) 300 MG 24 hr tablet Take 1 tablet (300 mg total) by mouth every morning.   carvedilol (COREG) 3.125 MG tablet Take 1 tablet (3.125 mg total) by mouth 2 (two) times daily.   Cyanocobalamin (VITAMIN B12) 1000 MCG TBCR Take 1 tablet by mouth daily.   diclofenac Sodium (VOLTAREN) 1 % GEL Apply 4 g topically 4 (four) times daily as needed (for pain).   fluticasone (FLONASE) 50 MCG/ACT nasal spray Place into both nostrils as needed for allergies or rhinitis.   furosemide (LASIX) 20 MG tablet Take 2 tablets (40 mg total) by mouth daily.   haloperidol (HALDOL) 0.5 MG tablet Take 0.5 mg by mouth 2 (two) times daily.   hydrOXYzine (VISTARIL) 25 MG capsule TAKE 1 CAPSULE BY MOUTH AT  BEDTIME   ipratropium-albuterol (DUONEB) 0.5-2.5 (3) MG/3ML SOLN Take 3 mLs by nebulization 2 (two) times daily.   losartan (COZAAR) 100 MG tablet Take 1 tablet (100 mg total) by mouth daily.   pantoprazole (PROTONIX) 40 MG tablet Take 1 tablet once daily.  predniSONE (DELTASONE) 20 MG tablet Take 3 tablets (60 mg total) by mouth daily for 3 days, THEN 2.5 tablets (50 mg total) daily for 3 days, THEN 2 tablets (40 mg total) daily for 3 days, THEN 1.5 tablets (30 mg total) daily for 3 days, THEN 1 tablet (20 mg total) daily for 3 days, THEN 0.5 tablets (10 mg total) daily for 3 days.   traMADol (ULTRAM) 50 MG tablet TAKE 1 TABLET BY  MOUTH  TWICE DAILY AS NEEDED   No current facility-administered medications on file prior to visit.     Social Hx: She  reports that she quit smoking about 7 years ago. Her smoking use included cigarettes. She has a 3.50 pack-year smoking history. She has never used smokeless tobacco. She reports that she does not drink alcohol and does not use drugs.    Diagnosis/Reason for Visit:   CONGESTIVE HEART FAILURE  Services Requested:  Vital Signs (BP, Pulse, O2, Weight)  Physical Exam  Medication Reconciliation  Assess for Other Rehab/Nursing Needs  # of Visits Needed/Frequency per Week: 1 TIME PER WEEK FOR 4 WEEKS; FIRST VISIT STARTING NEXT WEEK PER [name], PAC

## 2020-09-30 NOTE — Assessment & Plan Note (Signed)
No signs of acute overload. She is currently being managed by cardiology. HAs not had much reported success with oral lasix. May benefit from another round of IV diuresis.  She will follow-up with cardiology next week.

## 2020-09-30 NOTE — Assessment & Plan Note (Signed)
Will place referral to nephrology. 

## 2020-09-30 NOTE — Telephone Encounter (Signed)
[place] Visit Initial Request  Date of Request ([place]):  September 30, 2020  Requesting Provider:  Liliane Shi, [facility]    Agency Requested:    [facility] Contact:  Joen Laura, RN, [address], [place], [place]  [address] [email or web address]  Phone #: [phone] Fax #: [phone]  Patient Demographic Information: Name:  [name] Age:  79 y.o.   DOB:  09/11/1941  MRN:  [id number]   Address:   [address] [place] [address]   Phone Numbers:   Home Phone [phone]  Mobile [phone]     Emergency Contact Information on File:   Contact Information    Name Relation Home Work [place] Daughter [phone]        The above family members may be contacted for information on this patient (review DPR on file):  Yes    Patient Clinical Information:  Primary Care Provider:  Vivi Barrack, MD  Primary Cardiologist:  Freada Bergeron, MD  Primary Electrophysiologist:  None   Past Medical Hx: [name]  has a past medical history of Chronic venous insufficiency, Colon, diverticulosis (Sept. 2011), DDD (degenerative disc disease), lumbosacral, Depression, DJD (degenerative joint disease), multiple sites, GERD (gastroesophageal reflux disease), Gout, Gout, History of cervical cancer (1972), TIA (transient ischemic attack) (05/13/2001), TIA (transient ischemic attack) (05/13/2001), Hyperlipidemia, Hypertension, Keloid (08/01/2018), Memory disorder (12/20/2016), Mitral valve prolapse (10/27/1999), Osteoarthritis of hip, Ovarian cancer ([place]) (1972), Psychosis ([place]), Recurrent boils, Sensorineural hearing loss of both ears, Subdural hematoma ([place]) (9/74/1638), and Uncomplicated opioid dependence ([place]) (01/31/2018).   Allergies: She is allergic to aspirin and sulfamethoxazole-trimethoprim.   Medications: Current Outpatient Medications on File Prior to Visit  Medication Sig  .  acetaminophen (TYLENOL) 500 MG tablet Take 500 mg by mouth at bedtime.   [name] allopurinol (ZYLOPRIM) 300 MG tablet TAKE 1 TABLET BY MOUTH  DAILY  . amLODipine (NORVASC) 10 MG tablet Take 1 tablet (10 mg total) by mouth daily.  [name] ascorbic acid (VITAMIN C) 500 MG tablet Take 500 mg by mouth daily.   [name] atorvastatin (LIPITOR) 40 MG tablet TAKE 1 TABLET BY MOUTH  DAILY  . benzonatate (TESSALON) 200 MG capsule Take 1 capsule (200 mg total) by mouth 3 (three) times daily as needed for cough.  . budesonide (PULMICORT) 0.25 MG/2ML nebulizer solution Take 2 mLs (0.25 mg total) by nebulization 2 (two) times daily.  [name] buPROPion (WELLBUTRIN XL) 300 MG 24 hr tablet Take 1 tablet (300 mg total) by mouth every morning.  . carvedilol (COREG) 3.125 MG tablet Take 1 tablet (3.125 mg total) by mouth 2 (two) times daily.  . Cyanocobalamin (VITAMIN B12) 1000 MCG TBCR Take 1 tablet by mouth daily.  . diclofenac Sodium (VOLTAREN) 1 % GEL Apply 4 g topically 4 (four) times daily as needed (for pain).  . fluticasone (FLONASE) 50 MCG/ACT nasal spray Place into both nostrils as needed for allergies or rhinitis.  . furosemide (LASIX) 20 MG tablet Take 2 tablets (40 mg total) by mouth daily.  . haloperidol (HALDOL) 0.5 MG tablet Take 0.5 mg by mouth 2 (two) times daily.  . hydrOXYzine (VISTARIL) 25 MG capsule TAKE 1 CAPSULE BY MOUTH AT  BEDTIME  . ipratropium-albuterol (DUONEB) 0.5-2.5 (3) MG/3ML SOLN Take 3 mLs by nebulization 2 (two) times daily.  [name] losartan (COZAAR) 100 MG tablet Take 1 tablet (100 mg total) by mouth daily.  . pantoprazole (PROTONIX) 40 MG tablet  Take 1 tablet once daily.  . predniSONE (DELTASONE) 20 MG tablet Take 3 tablets (60 mg total) by mouth daily for 3 days, THEN 2.5 tablets (50 mg total) daily for 3 days, THEN 2 tablets (40 mg total) daily for 3 days, THEN 1.5 tablets (30 mg total) daily for 3 days, THEN 1 tablet (20 mg total) daily for 3 days, THEN 0.5 tablets (10 mg total) daily for 3 days.  .  traMADol (ULTRAM) 50 MG tablet TAKE 1 TABLET BY MOUTH  TWICE DAILY AS NEEDED   No current facility-administered medications on file prior to visit.     Social Hx: She  reports that she quit smoking about 7 years ago. Her smoking use included cigarettes. She has a 3.50 pack-year smoking history. She has never used smokeless tobacco. She reports that she does not drink alcohol and does not use drugs.    Diagnosis/Reason for Visit:   CONGESTIVE HEART FAILURE  Services Requested:  Vital Signs (BP, Pulse, O2, Weight)  Physical Exam  Medication Reconciliation  Assess for Other Rehab/Nursing Needs  # of Visits Needed/Frequency per Week: 1 TIME PER WEEK x 4 WEEKS/FIRST VISIT TO BEGIN NEXT WEEK PER [name], PAC

## 2020-09-30 NOTE — Telephone Encounter (Signed)
Lori from [facility] called in with some questions about the the referral that was sent over    Best number  [phone]

## 2020-09-30 NOTE — Assessment & Plan Note (Signed)
Refill albuterol inhaler today.

## 2020-09-30 NOTE — Assessment & Plan Note (Signed)
Above goal.  Coreg 3.125 mg twice daily was added on during her hospitalization.  She is also on losartan 100 mg daily and amlodipine 10mg  daily.

## 2020-09-30 NOTE — Telephone Encounter (Signed)
PER SCOTT WEAVER, PAC CANCEL THE ORDER FOR [facility] AND PLACE ORDER FOR [facility] TO SEE THE PT.

## 2020-09-30 NOTE — Telephone Encounter (Signed)
Returned call to Cecille Rubin with [facility] in regards to she states she has questions about the referral. Left message that I will be in the office for about another 1 hour. If calls back after 3 pm she may reach out to the requesting provider Liliane Shi, [facility] with questions.

## 2020-10-01 ENCOUNTER — Other Ambulatory Visit: Payer: Self-pay | Admitting: *Deleted

## 2020-10-01 NOTE — Patient Outreach (Signed)
[facility]) Care Management  10/01/2020  [name] [birth year] [id number]   EMMI Red Flag Alert   Day #1 Date: 09/27/20 Red Alert Reason : Know who to call about changes in condition?, No    Outreach Attempt #2 Unsuccessful outreach call to patient , daughter [name]  Designated party release no answer able to leave a HIPAA compliant voice mail message for return call.   Plan Will plan return call in the next 4 business days.    [name], RN, BSN  [place] Management Coordinator  [phone]- Mobile [phone]- Toll Free Main Office

## 2020-10-04 ENCOUNTER — Other Ambulatory Visit (HOSPITAL_COMMUNITY): Payer: Self-pay | Admitting: Psychiatry

## 2020-10-04 NOTE — Telephone Encounter (Signed)
Referral  Received: 4 days ago Waldo Laine, Susy Frizzle; Michae Kava, CMA [place],   My name is Reche Dixon and I am the Environmental health practitioner with [facility]. We received the referral for [facility] today and then we received the cancellation for the referral. We did review her chart and feel like she is appropriate for our [facility] program. It would provide extra support long after home heath has to discharge her. We can see her at the the same time as Home health because of the [facility] benefit.  If you have questions or would like [place] to see her please let me know.   [place]  [phone]

## 2020-10-04 NOTE — Telephone Encounter (Signed)
If patient is doing poorly and she cannot accept home health, I would suggest she take her back to the ED for admission. Richardson Dopp, PA-C    10/04/2020 5:20 PM

## 2020-10-04 NOTE — Telephone Encounter (Signed)
[name] with [facility] called in and stated that pt daughter refused home health.  She stated she works and does not have time for that.  She stated that pt is not doing well and really needs to be back In the hosp but she is not certain daughter is going to take her back, she stated it was not a good visit.    [phone]

## 2020-10-04 NOTE — Telephone Encounter (Signed)
Will forward this to Richardson Dopp, [facility] and his [place].

## 2020-10-05 ENCOUNTER — Other Ambulatory Visit: Payer: Self-pay | Admitting: Family Medicine

## 2020-10-05 NOTE — Progress Notes (Signed)
Cardiology Office Note:    Date:  10/07/2020   ID:  [name], DOB 1941/02/15, MRN [id number]  PCP:  Vivi Barrack, MD  [facility] Cardiologist:  Freada Bergeron, MD  [facility] Electrophysiologist:  None   Referring MD: Vivi Barrack, MD    History of Present Illness:    [name] is a 79 y.o. female with a hx of chronic systolic HF, TIA, schizophrenia, dementia, CKD, and ovarian cancer s/p oophrectomy who presented to clinic on 09/22/20 with worsening SOB, LE edema and hypoxia requiring hospitalization now returning to clinic for follow-up.   Patient admitted from 11/24-11/27 for worsening SOB and hypoxia. She was diuresed with IV lasix and treated with antibiotics for COPD exacerbation. Her O2 status improved and she was discharged home.   Last saw Richardson Dopp on 12/01. During that visit, the patient was continuing to have shortness of breath but was overall improved since admission. Weight was stable but with continued LE edema. She was started on lasix 40mg  daily and imdur 30mg  daily. She presents today for follow-up.  Today, the patient's daughter states that she has been concerned about her mother's fluid status and kidney function for over a year. She was worried that lasix would make her renal function worse as she was recently taken off the medication due to rising Cr. She admits that she left the hospital prematurely as she was concerned about her mother getting IV lasix and the changes in her kidney function. She has done more research since that time and understands that the HF likely is contributing to her elevated fluid levels and may be making the kidney function worse. She is amenable to resuming diuresis. She states that she would like to try to get the fluid off as an out-patient but willing to hospitalize if needed.   Currently, the patient states her breathing is improved. She continues to have significant fluid on board and some pain in her  legs associated with this.   Past Medical History:  Diagnosis Date  . Chronic venous insufficiency   . Colon, diverticulosis Sept. 2011   outpatient colonoscopy by Dr. Cristina Gong.  Need record  . DDD (degenerative disc disease), lumbosacral   . Depression   . DJD (degenerative joint disease), multiple sites    Low back pain worst  . GERD (gastroesophageal reflux disease)   . Gout   . Gout    Uric Acid level 4.2 on 300 allopurinol  . History of cervical cancer 1972  . Hx-TIA (transient ischemic attack) 05/13/2001   Right facial numbness  . Hx-TIA (transient ischemic attack) 05/13/2001   Looking back in Epic, the patient had a hospitalization in July 2002 for evaluation of questionable TIA (s/s of right facial numbness associated with mild blurred vision in her right eye, diaphoresis, and mild confusion) with negative CT head and negative MRI/MRA and was started on Plavix after being heparinized. She was to have possible follow up with Neurology for reevaluation of the need for Pl  . Hyperlipidemia   . Hypertension   . Keloid 08/01/2018  . Memory disorder 12/20/2016  . Mitral valve prolapse 10/27/1999   Subsequent 2D echo show normal mitral valve  . Osteoarthritis of hip    bilateral hips  . Ovarian cancer ([place]) 1972   S/P oophorectomy  . Psychosis ([place])   . Recurrent boils    History of MRSA skin infections with abscess  . Sensorineural hearing loss of both ears   .  Subdural hematoma ([place]) 01/26/2017   bilateral  . Uncomplicated opioid dependence ([place]) 01/31/2018    Past Surgical History:  Procedure Laterality Date  . ABDOMINAL HYSTERECTOMY     1972  . BURR HOLE Bilateral 02/12/2017   Procedure: Haskell Flirt;  Surgeon: Earnie Larsson, MD;  Location: [place];  Service: Neurosurgery;  Laterality: Bilateral;  . CERVICAL DISCECTOMY  7/07   C5-C6  . JOINT REPLACEMENT     bilateral hip replacement  . LUMBAR DISC SURGERY     L5-S1 7/07  . LUMBAR LAMINECTOMY/DECOMPRESSION MICRODISCECTOMY N/A  02/02/2016   Procedure: L4-5 Decompression;  Surgeon: Marybelle Killings, MD;  Location: [place];  Service: Orthopedics;  Laterality: N/A;  . [place] for ovarian cancer    Current Medications: Current Meds  Medication Sig  . acetaminophen (TYLENOL) 500 MG tablet Take 500 mg by mouth at bedtime.   [name] albuterol (VENTOLIN HFA) 108 (90 Base) MCG/ACT inhaler Inhale 2 puffs into the lungs every 6 (six) hours as needed for wheezing or shortness of breath.  . allopurinol (ZYLOPRIM) 300 MG tablet TAKE 1 TABLET BY MOUTH  DAILY  . amLODipine (NORVASC) 10 MG tablet TAKE 1 TABLET BY MOUTH  DAILY  . ascorbic acid (VITAMIN C) 500 MG tablet Take 500 mg by mouth daily.   [name] atorvastatin (LIPITOR) 40 MG tablet TAKE 1 TABLET BY MOUTH  DAILY  . buPROPion (WELLBUTRIN XL) 300 MG 24 hr tablet Take 1 tablet (300 mg total) by mouth every morning.  . Cyanocobalamin (VITAMIN B12) 1000 MCG TBCR Take 1 tablet by mouth daily.  . diclofenac Sodium (VOLTAREN) 1 % GEL Apply 4 g topically 4 (four) times daily as needed (for pain).  . fluticasone (FLONASE) 50 MCG/ACT nasal spray Place into both nostrils as needed for allergies or rhinitis.  . haloperidol (HALDOL) 0.5 MG tablet Take 0.5 mg by mouth 2 (two) times daily.  . hydrOXYzine (VISTARIL) 25 MG capsule TAKE 1 CAPSULE BY MOUTH AT  BEDTIME  . ipratropium-albuterol (DUONEB) 0.5-2.5 (3) MG/3ML SOLN Take 3 mLs by nebulization 2 (two) times daily.  [name] losartan (COZAAR) 100 MG tablet Take 1 tablet (100 mg total) by mouth daily.  . pantoprazole (PROTONIX) 40 MG tablet TAKE 1 TABLET BY MOUTH  TWICE DAILY BEFORE A MEAL  . predniSONE (DELTASONE) 20 MG tablet Take 3 tablets (60 mg total) by mouth daily for 3 days, THEN 2.5 tablets (50 mg total) daily for 3 days, THEN 2 tablets (40 mg total) daily for 3 days, THEN 1.5 tablets (30 mg total) daily for 3 days, THEN 1 tablet (20 mg total) daily for 3 days, THEN 0.5 tablets (10 mg total) daily for 3 days.  . traMADol (ULTRAM) 50 MG  tablet TAKE 1 TABLET BY MOUTH  TWICE DAILY AS NEEDED  . [DISCONTINUED] budesonide (PULMICORT) 0.25 MG/2ML nebulizer solution Take 2 mLs (0.25 mg total) by nebulization 2 (two) times daily.  . [DISCONTINUED] carvedilol (COREG) 3.125 MG tablet Take 1 tablet (3.125 mg total) by mouth 2 (two) times daily.  . [DISCONTINUED] furosemide (LASIX) 20 MG tablet Take 2 tablets (40 mg total) by mouth daily.     Allergies:   Aspirin and Sulfamethoxazole-trimethoprim   Social History   Socioeconomic History  . Marital status: Widowed    Spouse name: Not on file  . Number of children: 3  . Years of education: 84  . Highest education level: Not on file  Occupational History  . Occupation: Retired  Tobacco  Use  . Smoking status: Former Smoker    Packs/day: 0.10    Years: 35.00    Pack years: 3.50    Types: Cigarettes    Quit date: 04/08/2013    Years since quitting: 7.5  . Smokeless tobacco: Never Used  . Tobacco comment: smoked a few when her son died in 02-11-2013   Vaping Use  . Vaping Use: Never used  Substance and Sexual Activity  . Alcohol use: No    Alcohol/week: 0.0 standard drinks  . Drug use: No  . Sexual activity: Never  Other Topics Concern  . Not on file  Social History Narrative   Lives with daughter in [place]. Daughter is primary caretaker, surrogate decision-maker, and arranges for [facility] aide and other caregivers to supervise Ms. Talley at home. Ambulatory   Social Determinants of Health   Financial Resource Strain: Not on file  Food Insecurity: Not on file  Transportation Needs: No Transportation Needs  . Lack of Transportation (Medical): No  . Lack of Transportation (Non-Medical): No  Physical Activity: Not on file  Stress: Not on file  Social Connections: Not on file     Family History: The patient's family history includes Diabetes in her maternal grandfather and mother; Hearing loss in her mother; Heart disease in her mother; Other in her brother; Post-traumatic  stress disorder in her father; Schizophrenia in her maternal grandmother; Stroke in her mother. There is no history of Kidney cancer or Bladder Cancer.  ROS:   Please see the history of present illness.    Review of Systems  Constitutional: Negative for chills and fever.  HENT: Negative for sore throat.   Eyes: Negative for blurred vision.  Respiratory: Positive for shortness of breath.   Cardiovascular: Positive for orthopnea and leg swelling. Negative for chest pain, palpitations and claudication.  Gastrointestinal: Negative for blood in stool, heartburn, nausea and vomiting.  Genitourinary: Positive for frequency.  Musculoskeletal: Positive for joint pain.  Neurological: Negative for dizziness and loss of consciousness.  Endo/Heme/Allergies: Does not bruise/bleed easily.  Psychiatric/Behavioral: Negative for substance abuse.  .  EKGs/Labs/Other Studies Reviewed:    The following studies were reviewed today: TTE October 17, 2020: IMPRESSIONS  1. Left ventricular ejection fraction, by estimation, is 65 to 70%. The  left ventricle has normal function. The left ventricle has no regional  wall motion abnormalities. There is mild left ventricular hypertrophy.  Left ventricular diastolic parameters  are consistent with Grade I diastolic dysfunction (impaired relaxation).  2. Right ventricular systolic function is normal. The right ventricular  size is normal. There is mildly elevated pulmonary artery systolic  pressure.  3. The mitral valve is normal in structure. No evidence of mitral valve  regurgitation. No evidence of mitral stenosis.  4. The aortic valve is normal in structure. Aortic valve regurgitation is  not visualized. No aortic stenosis is present.  5. The inferior vena cava is normal in size with greater than 50%  respiratory variability, suggesting right atrial pressure of 3 mmHg.   FINDINGS  Left Ventricle: Left ventricular ejection fraction, by estimation, is 65  to  70%. The left ventricle has normal function. The left ventricle has no  regional wall motion abnormalities. The left ventricular internal cavity  size was normal in size. There is  mild left ventricular hypertrophy. Left ventricular diastolic parameters  are consistent with Grade I diastolic dysfunction (impaired relaxation).   Right Ventricle: The right ventricular size is normal. No increase in  right ventricular wall thickness. Right  ventricular systolic function is  normal. There is mildly elevated pulmonary artery systolic pressure. The  tricuspid regurgitant velocity is 2.78  m/s, and with an assumed right atrial pressure of 8 mmHg, the estimated  right ventricular systolic pressure is 16.1 mmHg.   Left Atrium: Left atrial size was normal in size.   Right Atrium: Right atrial size was normal in size.   Pericardium: There is no evidence of pericardial effusion.   Mitral Valve: The mitral valve is normal in structure. No evidence of  mitral valve regurgitation. No evidence of mitral valve stenosis.   Tricuspid Valve: The tricuspid valve is normal in structure. Tricuspid  valve regurgitation is not demonstrated. No evidence of tricuspid  stenosis.   Aortic Valve: The aortic valve is normal in structure. Aortic valve  regurgitation is not visualized. No aortic stenosis is present.   Pulmonic Valve: The pulmonic valve was normal in structure. Pulmonic valve  regurgitation is not visualized. No evidence of pulmonic stenosis.   Aorta: The aortic root is normal in size and structure.   Venous: The inferior vena cava is normal in size with greater than 50%  respiratory variability, suggesting right atrial pressure of 3 mmHg.   IAS/Shunts: No atrial level shunt detected by color flow Doppler.   EKG:  EKG 09/22/20: NSR with early r-wave progression  Recent Labs: 08/30/2020: TSH 4.62 09/22/2020: B Natriuretic Peptide 35.2 09/23/2020: ALT 21; Hemoglobin 11.0; Platelets  247 09/25/2020: BUN 41; Creatinine, Ser 1.77; Potassium 4.5; Sodium 140  Recent Lipid Panel    Component Value Date/Time   CHOL 212 (H) 08/30/2020 1546   TRIG 136 08/30/2020 1546   HDL 49 (L) 08/30/2020 1546   CHOLHDL 4.3 08/30/2020 1546   VLDL 23.6 04/30/2018 1546   LDLCALC 137 (H) 08/30/2020 1546      Physical Exam:    VS:  BP (!) 156/80   Pulse 75   Ht 5' 3.5" (1.613 m)   Wt 280 lb 9.6 oz (127.3 kg)   SpO2 95%   BMI 48.93 kg/m     Wt Readings from Last 3 Encounters:  10/07/20 280 lb 9.6 oz (127.3 kg)  09/30/20 274 lb 14.4 oz (124.7 kg)  09/29/20 274 lb 14.4 oz (124.7 kg)     GEN: Comfortable, only intermittently answers questions HEENT: Normal NECK: JVD difficult to assess due to body habitus CARDIAC: RRR, no murmurs, rubs, gallops RESPIRATORY:  Clear to auscultation without rales, wheezing or rhonchi  ABDOMEN: Soft, non-tender, non-distended MUSCULOSKELETAL:  3+ pitting edema to the hips SKIN: Warm and dry NEUROLOGIC:  Alert and oriented x 3 PSYCHIATRIC:  Normal affect   ASSESSMENT:    1. Acute on chronic diastolic (congestive) heart failure ([place])   2. Essential hypertension   3. Stage 3b chronic kidney disease ([place])   4. Acute on chronic heart failure with preserved ejection fraction ([place])   5. Hyperlipidemia, unspecified hyperlipidemia type    PLAN:    In order of problems listed above:  #Acute on chronic diastolic heart failure: LVEF 65-70%. Continued significant LE edema. Family has been hesitant to increase lasix due to renal dysfunction, but she is grossly volume up. We discussed how diuresis will likely help her renal function and the patient and her daughter are amenable to trying. Given suspected gut edema, will transition from lasix to torsemide to help with diuresis. Will have very close monitoring of volume status and renal function going forward. Per family's wishes, trying to avoid repeat hospitalization if we can. -  Stop lasix and transition  to torsemide 40mg  daily -Will repeat BMET today and in 1 week -Will need frequent appointments as we attempt to remove volume -Increase coreg to 6.25mg  BID -Continue losartan 100mg  daily -Compression wraps to the lower extremities -Monitor daily weights and record them for [place] to review -Low Na diet  #Shortness of breath: Likely multifactorial in the setting of chronic HFpEF, reactive airway disease. Overall symptoms improved since last visit. Remains grossly overloaded as above. -Diuresis as above -Continue inhalers and neb treatments -Follow-up with pulm as scheduled  #HTN: Elevated today. -Diuresis as above -Increase coreg as above -Continue amlodipine 10mg  daily -Continue losartan 100mg  daily -If does not improve with volume removal, can add spironolactone  #CKD: Likely worsening renal function in the setting of renovascular congestion. -Agree with nephology referral -Diuresis as above -Repeat BMET today and in 1 week following diuresis  #HLD: -Continue atorvasatin 40mg  daily  Extensive amount of time spent with the patient and her daughter discussing the plan and the goal to help remove some of the fluid. Amenable to diuresis at this time. Will arrange for close follow-up   Medication Adjustments/Labs and Tests Ordered: Current medicines are reviewed at length with the patient today.  Concerns regarding medicines are outlined above.  Orders Placed This Encounter  Procedures  . Basic Metabolic Panel (BMET)   Meds ordered this encounter  Medications  . carvedilol (COREG) 6.25 MG tablet    Sig: Take 1 tablet (6.25 mg total) by mouth 2 (two) times daily.    Dispense:  180 tablet    Refill:  3    Dose increase  . torsemide (DEMADEX) 20 MG tablet    Sig: Take 2 tablets (40 mg total) by mouth daily.    Dispense:  180 tablet    Refill:  3    Patient to stop furosemide  . budesonide (PULMICORT) 0.25 MG/2ML nebulizer solution    Sig: Take 2 mLs (0.25 mg total) by  nebulization 2 (two) times daily.    Dispense:  120 mL    Refill:  2    Patient Instructions  Medication Instructions:  Your physician has recommended you make the following change in your medication: Increase Carvedilol to 6.25 mg by mouth twice daily. Stop furosemide Start torsemide 40 mg by mouth daily  *If you need a refill on your cardiac medications before your next appointment, please call your pharmacy*   Lab Work: Lab work to be done today--BMP If you have labs (blood work) drawn today and your tests are completely normal, you will receive your results only by: [name] MyChart Message (if you have MyChart) OR . A paper copy in the mail If you have any lab test that is abnormal or we need to change your treatment, we will call you to review the results.   Testing/Procedures: none   Follow-Up: At [facility], you and your health needs are our priority.  As part of our continuing mission to provide you with exceptional heart care, we have created designated Provider Care Teams.  These Care Teams include your primary Cardiologist (physician) and Advanced Practice Providers (APPs -  Physician Assistants and Nurse Practitioners) who all work together to provide you with the care you need, when you need it.  We recommend signing up for the patient portal called "MyChart".  Sign up information is provided on this After Visit Summary.  MyChart is used to connect with patients for Virtual Visits (Telemedicine).  Patients are able to view  lab/test results, encounter notes, upcoming appointments, etc.  Non-urgent messages can be sent to your provider as well.   To learn more about what you can do with MyChart, go to [email or web address].    Your next appointment:   December 16,2021 at 2:40  The format for your next appointment:   In person  Provider:   Gwyndolyn Kaufman, MD   Other Instructions      Signed, Freada Bergeron, MD  10/07/2020 4:35 PM    [place]

## 2020-10-06 ENCOUNTER — Other Ambulatory Visit: Payer: Self-pay | Admitting: *Deleted

## 2020-10-06 NOTE — Patient Outreach (Signed)
[facility]) Care Management  10/06/2020  [name] April 22, 1941 [id number]  EMMI Red Flag Alert   Day #1 Date: 09/27/20 Red Alert Reason : Know who to call about changes in condition?, No    Outreach Attempt #3 Unsuccessful outreach call to patient , daughter [name]  Designated party release no answer able to leave a HIPAA compliant voice mail message for return call.   Plan Will plan return call in the next 3 weeks per [facility] care management work flow.   [name], RN, BSN  [facility] Management Coordinator  [phone]- Mobile [phone]- Toll Free Main Office

## 2020-10-07 ENCOUNTER — Encounter: Payer: Self-pay | Admitting: Cardiology

## 2020-10-07 ENCOUNTER — Ambulatory Visit (INDEPENDENT_AMBULATORY_CARE_PROVIDER_SITE_OTHER): Payer: Medicare Other | Admitting: Cardiology

## 2020-10-07 ENCOUNTER — Other Ambulatory Visit: Payer: Self-pay

## 2020-10-07 VITALS — BP 156/80 | HR 75 | Ht 63.5 in | Wt 280.6 lb

## 2020-10-07 DIAGNOSIS — I10 Essential (primary) hypertension: Secondary | ICD-10-CM

## 2020-10-07 DIAGNOSIS — N1832 Chronic kidney disease, stage 3b: Secondary | ICD-10-CM | POA: Diagnosis not present

## 2020-10-07 DIAGNOSIS — E785 Hyperlipidemia, unspecified: Secondary | ICD-10-CM | POA: Diagnosis not present

## 2020-10-07 DIAGNOSIS — I5033 Acute on chronic diastolic (congestive) heart failure: Secondary | ICD-10-CM

## 2020-10-07 MED ORDER — TORSEMIDE 20 MG PO TABS: 40.0000 mg | ORAL_TABLET | Freq: Every day | ORAL | 3 refills | Status: DC

## 2020-10-07 MED ORDER — BUDESONIDE 0.25 MG/2ML IN SUSP
0.2500 mg | Freq: Two times a day (BID) | RESPIRATORY_TRACT | 2 refills | Status: DC
Start: 2020-10-07 — End: 2022-01-10

## 2020-10-07 MED ORDER — CARVEDILOL 6.25 MG PO TABS: 6.2500 mg | ORAL_TABLET | Freq: Two times a day (BID) | ORAL | 3 refills | Status: DC

## 2020-10-07 NOTE — Patient Instructions (Signed)
Medication Instructions:  Your physician has recommended you make the following change in your medication: Increase Carvedilol to 6.25 mg by mouth twice daily. Stop furosemide Start torsemide 40 mg by mouth daily  *If you need a refill on your cardiac medications before your next appointment, please call your pharmacy*   Lab Work: Lab work to be done today--BMP If you have labs (blood work) drawn today and your tests are completely normal, you will receive your results only by: [name] MyChart Message (if you have MyChart) OR . A paper copy in the mail If you have any lab test that is abnormal or we need to change your treatment, we will call you to review the results.   Testing/Procedures: none   Follow-Up: At [facility], you and your health needs are our priority.  As part of our continuing mission to provide you with exceptional heart care, we have created designated Provider Care Teams.  These Care Teams include your primary Cardiologist (physician) and Advanced Practice Providers (APPs -  Physician Assistants and Nurse Practitioners) who all work together to provide you with the care you need, when you need it.  We recommend signing up for the patient portal called "MyChart".  Sign up information is provided on this After Visit Summary.  MyChart is used to connect with patients for Virtual Visits (Telemedicine).  Patients are able to view lab/test results, encounter notes, upcoming appointments, etc.  Non-urgent messages can be sent to your provider as well.   To learn more about what you can do with MyChart, go to [email or web address].    Your next appointment:   December 16,2021 at 2:40  The format for your next appointment:   In person  Provider:   Gwyndolyn Kaufman, MD   Other Instructions

## 2020-10-08 LAB — BASIC METABOLIC PANEL
BUN/Creatinine Ratio: 17 (ref 12–28)
BUN: 29 mg/dL — ABNORMAL HIGH (ref 8–27)
CO2: 20 mmol/L (ref 20–29)
Calcium: 9.4 mg/dL (ref 8.7–10.3)
Chloride: 108 mmol/L — ABNORMAL HIGH (ref 96–106)
Creatinine, Ser: 1.66 mg/dL — ABNORMAL HIGH (ref 0.57–1.00)
GFR calc Af Amer: 34 mL/min/{1.73_m2} — ABNORMAL LOW (ref >59)
GFR calc non Af Amer: 29 mL/min/{1.73_m2} — ABNORMAL LOW (ref >59)
Glucose: 144 mg/dL — ABNORMAL HIGH (ref 65–99)
Potassium: 4.9 mmol/L (ref 3.5–5.2)
Sodium: 144 mmol/L (ref 134–144)

## 2020-10-11 ENCOUNTER — Telehealth: Payer: Self-pay

## 2020-10-11 NOTE — Telephone Encounter (Signed)
The patient has been notified of the result and verbalized understanding.  All questions (if any) were answered. [name] Flavin, RN 10/11/2020 9:20 AM

## 2020-10-11 NOTE — Progress Notes (Deleted)
Cardiology Office Note:    Date:  10/11/2020   ID:  [name], DOB 12-21-1940, MRN [id number]  PCP:  Vivi Barrack, MD  [facility] Cardiologist:  Freada Bergeron, MD  [facility] Electrophysiologist:  None   Referring MD: Vivi Barrack, MD    History of Present Illness:    [name] is a 79 y.o. female with a hx of chronic diastolic HF, TIA, schizophrenia, dementia, CKD, and ovarian cancer s/p oophrectomy who is undergoing diuresis for significant volume overload in the setting of CKD and diastolic HF now presenting to clinic for follow-up.  Patient admitted from 11/24-11/27 for worsening SOB and hypoxia. She was diuresed with IV lasix and treated with antibiotics for COPD exacerbation. Her O2 status improved and she was discharged home. Daughter admits that she left the hospital prematurely as she was concerned about the lasix affecting her renal function and decided to take the patient home.  Saw Richardson Dopp on 12/01. During that visit, the patient was continuing to have shortness of breath but was overall improved since admission. Weight was stable but with continued LE edema. She was started on lasix 40mg  daily and imdur 30mg  daily. She presents today for follow-up.  During our last visit on 10/07/20, patient's daughter expressed that she has been concerned about her mother's fluid status and kidney function for over a year. She was worried that lasix would make her renal function worse as she was recently taken off the medication due to rising Cr. She admits that she left the hospital prematurely as she was concerned about her mother getting IV lasix and the changes in her kidney function. She has done more research since that time and understands that the HF likely is contributing to her elevated fluid levels and may be making the kidney function worse. She was amenable to resuming diuresis. We started her on torsemide 40mg  daily with plans for follow-up 1  week later with repeat BMP at that time.  Currently,  Past Medical History:  Diagnosis Date  . Chronic venous insufficiency   . Colon, diverticulosis Sept. 2011   outpatient colonoscopy by Dr. Cristina Gong.  Need record  . DDD (degenerative disc disease), lumbosacral   . Depression   . DJD (degenerative joint disease), multiple sites    Low back pain worst  . GERD (gastroesophageal reflux disease)   . Gout   . Gout    Uric Acid level 4.2 on 300 allopurinol  . History of cervical cancer 1972  . Hx-TIA (transient ischemic attack) 05/13/2001   Right facial numbness  . Hx-TIA (transient ischemic attack) 05/13/2001   Looking back in Epic, the patient had a hospitalization in July 2002 for evaluation of questionable TIA (s/s of right facial numbness associated with mild blurred vision in her right eye, diaphoresis, and mild confusion) with negative CT head and negative MRI/MRA and was started on Plavix after being heparinized. She was to have possible follow up with Neurology for reevaluation of the need for Pl  . Hyperlipidemia   . Hypertension   . Keloid 08/01/2018  . Memory disorder 12/20/2016  . Mitral valve prolapse 10/27/1999   Subsequent 2D echo show normal mitral valve  . Osteoarthritis of hip    bilateral hips  . Ovarian cancer ([place]) 1972   S/P oophorectomy  . Psychosis ([place])   . Recurrent boils    History of MRSA skin infections with abscess  . Sensorineural hearing loss of both ears   .  Subdural hematoma ([place]) 01/26/2017   bilateral  . Uncomplicated opioid dependence ([place]) 01/31/2018    Past Surgical History:  Procedure Laterality Date  . ABDOMINAL HYSTERECTOMY     1972  . BURR HOLE Bilateral 02/12/2017   Procedure: Haskell Flirt;  Surgeon: Earnie Larsson, MD;  Location: [place];  Service: Neurosurgery;  Laterality: Bilateral;  . CERVICAL DISCECTOMY  7/07   C5-C6  . JOINT REPLACEMENT     bilateral hip replacement  . LUMBAR DISC SURGERY     L5-S1 7/07  . LUMBAR  LAMINECTOMY/DECOMPRESSION MICRODISCECTOMY N/A 02/02/2016   Procedure: L4-5 Decompression;  Surgeon: Marybelle Killings, MD;  Location: [place];  Service: Orthopedics;  Laterality: N/A;  . [place] for ovarian cancer    Current Medications: No outpatient medications have been marked as taking for the 10/14/20 encounter (Appointment) with Freada Bergeron, MD.     Allergies:   Aspirin and Sulfamethoxazole-trimethoprim   Social History   Socioeconomic History  . Marital status: Widowed    Spouse name: Not on file  . Number of children: 3  . Years of education: 8  . Highest education level: Not on file  Occupational History  . Occupation: Retired  Tobacco Use  . Smoking status: Former Smoker    Packs/day: 0.10    Years: 35.00    Pack years: 3.50    Types: Cigarettes    Quit date: 04/08/2013    Years since quitting: 7.5  . Smokeless tobacco: Never Used  . Tobacco comment: smoked a few when her son died in 02-05-2013   Vaping Use  . Vaping Use: Never used  Substance and Sexual Activity  . Alcohol use: No    Alcohol/week: 0.0 standard drinks  . Drug use: No  . Sexual activity: Never  Other Topics Concern  . Not on file  Social History Narrative   Lives with daughter in [place]. Daughter is primary caretaker, surrogate decision-maker, and arranges for [employer] aide and other caregivers to supervise Ms. Grave at home. Ambulatory   Social Determinants of Health   Financial Resource Strain: Not on file  Food Insecurity: Not on file  Transportation Needs: No Transportation Needs  . Lack of Transportation (Medical): No  . Lack of Transportation (Non-Medical): No  Physical Activity: Not on file  Stress: Not on file  Social Connections: Not on file     Family History: The patient's ***family history includes Diabetes in her maternal grandfather and mother; Hearing loss in her mother; Heart disease in her mother; Other in her brother; Post-traumatic stress disorder in her  father; Schizophrenia in her maternal grandmother; Stroke in her mother. There is no history of Kidney cancer or Bladder Cancer.  ROS:   Please see the history of present illness.    *** All other systems reviewed and are negative.  EKGs/Labs/Other Studies Reviewed:    The following studies were reviewed today: TTE Oct 11, 2020: IMPRESSIONS  1. Left ventricular ejection fraction, by estimation, is 65 to 70%. The  left ventricle has normal function. The left ventricle has no regional  wall motion abnormalities. There is mild left ventricular hypertrophy.  Left ventricular diastolic parameters  are consistent with Grade I diastolic dysfunction (impaired relaxation).  2. Right ventricular systolic function is normal. The right ventricular  size is normal. There is mildly elevated pulmonary artery systolic  pressure.  3. The mitral valve is normal in structure. No evidence of mitral valve  regurgitation. No evidence of mitral stenosis.  4. The aortic valve is normal in structure. Aortic valve regurgitation is  not visualized. No aortic stenosis is present.  5. The inferior vena cava is normal in size with greater than 50%  respiratory variability, suggesting right atrial pressure of 3 mmHg.   FINDINGS  Left Ventricle: Left ventricular ejection fraction, by estimation, is 65  to 70%. The left ventricle has normal function. The left ventricle has no  regional wall motion abnormalities. The left ventricular internal cavity  size was normal in size. There is  mild left ventricular hypertrophy. Left ventricular diastolic parameters  are consistent with Grade I diastolic dysfunction (impaired relaxation).   Right Ventricle: The right ventricular size is normal. No increase in  right ventricular wall thickness. Right ventricular systolic function is  normal. There is mildly elevated pulmonary artery systolic pressure. The  tricuspid regurgitant velocity is 2.78  m/s, and with an  assumed right atrial pressure of 8 mmHg, the estimated  right ventricular systolic pressure is 26.9 mmHg.   Left Atrium: Left atrial size was normal in size.   Right Atrium: Right atrial size was normal in size.   Pericardium: There is no evidence of pericardial effusion.   Mitral Valve: The mitral valve is normal in structure. No evidence of  mitral valve regurgitation. No evidence of mitral valve stenosis.   Tricuspid Valve: The tricuspid valve is normal in structure. Tricuspid  valve regurgitation is not demonstrated. No evidence of tricuspid  stenosis.   Aortic Valve: The aortic valve is normal in structure. Aortic valve  regurgitation is not visualized. No aortic stenosis is present.   Pulmonic Valve: The pulmonic valve was normal in structure. Pulmonic valve  regurgitation is not visualized. No evidence of pulmonic stenosis.   Aorta: The aortic root is normal in size and structure.   Venous: The inferior vena cava is normal in size with greater than 50%  respiratory variability, suggesting right atrial pressure of 3 mmHg.   IAS/Shunts: No atrial level shunt detected by color flow Doppler.   EKG:  EKG is *** ordered today.  The ekg ordered today demonstrates ***  Recent Labs: 08/30/2020: TSH 4.62 09/22/2020: B Natriuretic Peptide 35.2 09/23/2020: ALT 21; Hemoglobin 11.0; Platelets 247 10/07/2020: BUN 29; Creatinine, Ser 1.66; Potassium 4.9; Sodium 144  Recent Lipid Panel    Component Value Date/Time   CHOL 212 (H) 08/30/2020 1546   TRIG 136 08/30/2020 1546   HDL 49 (L) 08/30/2020 1546   CHOLHDL 4.3 08/30/2020 1546   VLDL 23.6 04/30/2018 1546   LDLCALC 137 (H) 08/30/2020 1546     Risk Assessment/Calculations:   {Does this patient have ATRIAL FIBRILLATION?[phone]}   Physical Exam:    VS:  There were no vitals taken for this visit.    Wt Readings from Last 3 Encounters:  10/07/20 280 lb 9.6 oz (127.3 kg)  09/30/20 274 lb 14.4 oz (124.7 kg)  09/29/20 274  lb 14.4 oz (124.7 kg)     GEN: *** Well nourished, well developed in no acute distress HEENT: Normal NECK: No JVD; No carotid bruits LYMPHATICS: No lymphadenopathy CARDIAC: ***RRR, no murmurs, rubs, gallops RESPIRATORY:  Clear to auscultation without rales, wheezing or rhonchi  ABDOMEN: Soft, non-tender, non-distended MUSCULOSKELETAL:  No edema; No deformity  SKIN: Warm and dry NEUROLOGIC:  Alert and oriented x 3 PSYCHIATRIC:  Normal affect   ASSESSMENT:    No diagnosis found. PLAN:    In order of problems listed above:  #Acute on chronic diastolic heart failure: LVEF  65-70%. Continued significant LE edema. Family has been hesitant to increase lasix due to renal dysfunction, but she is grossly volume up. We discussed how diuresis will likely help her renal function and the patient and her daughter are amenable to trying. Given suspected gut edema, transitioned from lasix to torsemide to help with diuresis. Will have very close monitoring of volume status and renal function going forward. Per family's wishes, trying to avoid repeat hospitalization if we can. -Continue torsemide 40mg  daily -Will need frequent appointments as we attempt to remove volume -Continue coreg to 6.25mg  BID -Continue losartan 100mg  daily -Compression wraps to the lower extremities -Monitor daily weights and record them for [place] to review -Low Na diet  #Shortness of breath: Likely multifactorial in the setting of chronic HFpEF, reactive airway disease. Overall symptoms improved since last visit. Remains grossly overloaded as above. -Diuresis as above -Continue inhalers and neb treatments -Follow-up with pulm as scheduled  #HTN: Elevated today. -Diuresis as above -Continue coreg as above -Continue amlodipine 10mg  daily -Continue losartan 100mg  daily -If does not improve with volume removal, can add spironolactone  #CKD: Likely worsening renal function in the setting of renovascular  congestion. -Agree with nephology referral -Diuresis as above -Repeat BMET today and in 1 week following diuresis  #HLD: -Continue atorvasatin 40mg  daily    {Are you ordering a CV Procedure (e.g. stress test, cath, DCCV, TEE, etc)?   Press F2        :[id number]}    Medication Adjustments/Labs and Tests Ordered: Current medicines are reviewed at length with the patient today.  Concerns regarding medicines are outlined above.  No orders of the defined types were placed in this encounter.  No orders of the defined types were placed in this encounter.   There are no Patient Instructions on file for this visit.   Signed, Freada Bergeron, MD  10/11/2020 6:50 PM    [place]

## 2020-10-11 NOTE — Telephone Encounter (Signed)
-----   Message from Freada Bergeron, MD sent at 10/09/2020  3:59 PM EST ----- Her kidney function looks stable to slightly improved compared to what it was 2 weeks ago. Her Cr is now 1.66. Her other electrolytes look okay and stable compared to prior. We will repeat her levels after she takes the torsemide for a week. I will see her soon!

## 2020-10-13 ENCOUNTER — Emergency Department (HOSPITAL_COMMUNITY): Payer: Medicare Other

## 2020-10-13 ENCOUNTER — Encounter (HOSPITAL_COMMUNITY): Payer: Self-pay | Admitting: *Deleted

## 2020-10-13 ENCOUNTER — Emergency Department (HOSPITAL_COMMUNITY)
Admission: EM | Admit: 2020-10-13 | Discharge: 2020-10-14 | Disposition: A | Discharge status: Home / Self Care | Payer: Medicare Other | Attending: Emergency Medicine | Admitting: Emergency Medicine

## 2020-10-13 ENCOUNTER — Other Ambulatory Visit: Payer: Self-pay

## 2020-10-13 DIAGNOSIS — J90 Pleural effusion, not elsewhere classified: Secondary | ICD-10-CM | POA: Diagnosis not present

## 2020-10-13 DIAGNOSIS — Z96643 Presence of artificial hip joint, bilateral: Secondary | ICD-10-CM | POA: Diagnosis not present

## 2020-10-13 DIAGNOSIS — I5031 Acute diastolic (congestive) heart failure: Secondary | ICD-10-CM | POA: Diagnosis not present

## 2020-10-13 DIAGNOSIS — Z79899 Other long term (current) drug therapy: Secondary | ICD-10-CM | POA: Diagnosis not present

## 2020-10-13 DIAGNOSIS — I110 Hypertensive heart disease with heart failure: Secondary | ICD-10-CM | POA: Diagnosis not present

## 2020-10-13 DIAGNOSIS — N183 Chronic kidney disease, stage 3 unspecified: Secondary | ICD-10-CM | POA: Insufficient documentation

## 2020-10-13 DIAGNOSIS — Z87891 Personal history of nicotine dependence: Secondary | ICD-10-CM | POA: Insufficient documentation

## 2020-10-13 DIAGNOSIS — Z743 Need for continuous supervision: Secondary | ICD-10-CM | POA: Diagnosis not present

## 2020-10-13 DIAGNOSIS — I5033 Acute on chronic diastolic (congestive) heart failure: Secondary | ICD-10-CM | POA: Diagnosis not present

## 2020-10-13 DIAGNOSIS — Z8673 Personal history of transient ischemic attack (TIA), and cerebral infarction without residual deficits: Secondary | ICD-10-CM | POA: Diagnosis not present

## 2020-10-13 DIAGNOSIS — R0602 Shortness of breath: Secondary | ICD-10-CM | POA: Diagnosis not present

## 2020-10-13 DIAGNOSIS — Z8541 Personal history of malignant neoplasm of cervix uteri: Secondary | ICD-10-CM | POA: Insufficient documentation

## 2020-10-13 DIAGNOSIS — R079 Chest pain, unspecified: Secondary | ICD-10-CM | POA: Insufficient documentation

## 2020-10-13 DIAGNOSIS — R0789 Other chest pain: Secondary | ICD-10-CM | POA: Diagnosis not present

## 2020-10-13 DIAGNOSIS — I517 Cardiomegaly: Secondary | ICD-10-CM | POA: Diagnosis not present

## 2020-10-13 DIAGNOSIS — I129 Hypertensive chronic kidney disease with stage 1 through stage 4 chronic kidney disease, or unspecified chronic kidney disease: Secondary | ICD-10-CM | POA: Insufficient documentation

## 2020-10-13 LAB — BASIC METABOLIC PANEL
Anion gap: 11 (ref 5–15)
BUN: 20 mg/dL (ref 8–23)
CO2: 24 mmol/L (ref 22–32)
Calcium: 9.2 mg/dL (ref 8.9–10.3)
Chloride: 108 mmol/L (ref 98–111)
Creatinine, Ser: 1.58 mg/dL — ABNORMAL HIGH (ref 0.44–1.00)
GFR, Estimated: 33 mL/min — ABNORMAL LOW (ref >60)
Glucose, Bld: 120 mg/dL — ABNORMAL HIGH (ref 70–99)
Potassium: 4.5 mmol/L (ref 3.5–5.1)
Sodium: 143 mmol/L (ref 135–145)

## 2020-10-13 LAB — CBC
HCT: 40 % (ref 36.0–46.0)
Hemoglobin: 13.1 g/dL (ref 12.0–15.0)
MCH: 31.8 pg (ref 26.0–34.0)
MCHC: 32.8 g/dL (ref 30.0–36.0)
MCV: 97.1 fL (ref 80.0–100.0)
Platelets: 146 10*3/uL — ABNORMAL LOW (ref 150–400)
RBC: 4.12 MIL/uL (ref 3.87–5.11)
RDW: 17.3 % — ABNORMAL HIGH (ref 11.5–15.5)
WBC: 9.2 10*3/uL (ref 4.0–10.5)
nRBC: 0 % (ref 0.0–0.2)

## 2020-10-13 LAB — TROPONIN I (HIGH SENSITIVITY)
Troponin I (High Sensitivity): 11 ng/L (ref <18)
Troponin I (High Sensitivity): 11 ng/L (ref <18)

## 2020-10-13 LAB — BRAIN NATRIURETIC PEPTIDE: B Natriuretic Peptide: 190.6 pg/mL — ABNORMAL HIGH (ref 0.0–100.0)

## 2020-10-13 MED ORDER — FUROSEMIDE 10 MG/ML IJ SOLN
40.0000 mg | Freq: Once | INTRAMUSCULAR | Status: AC
  Administered 2020-10-13: 40 mg via INTRAVENOUS
  Filled 2020-10-13: qty 4

## 2020-10-13 NOTE — Discharge Instructions (Addendum)
Seen in the emergency department for increased shortness of breath chest pain and some edema on your legs and abdomen.  Your lab work did not show any significant abnormalities.  You were given a dose of diuretic with good urine output.  We are setting you up with heart failure at home team.  They should contact you for close follow-up.  If you experience worsening pain or shortness of breath please return to the emergency department

## 2020-10-13 NOTE — ED Notes (Signed)
2 failed attempts to collect labs 

## 2020-10-13 NOTE — ED Notes (Addendum)
Pt transported to xray 

## 2020-10-13 NOTE — ED Provider Notes (Signed)
[facility] EMERGENCY DEPARTMENT Provider Note   CSN: [id number] Arrival date & time: 10/13/20  1845     History Chief Complaint  Patient presents with   Chest Pain    [name] is a 79 y.o. female.  She has a history of CHF CKD peripheral edema.  She presents by ambulance from home with a complaint of chest pain.  Patient states the pain is resolved.  Patient lives with her daughter.  Patient is unhappy with her living situation.  She denies any shortness of breath abdominal pain.  She has chronic peripheral edema.  The history is provided by the patient.  Chest Pain Pain location:  Substernal area Pain quality: aching   Pain radiates to:  Does not radiate Pain severity:  Moderate Onset quality:  Gradual Duration:  1 hour Progression:  Resolved Chronicity:  New Relieved by:  Nothing Worsened by:  Nothing Ineffective treatments:  None tried Associated symptoms: no abdominal pain, no cough, no diaphoresis, no fever, no headache, no nausea, no shortness of breath and no vomiting   Risk factors: high cholesterol, hypertension and obesity        Past Medical History:  Diagnosis Date   Chronic venous insufficiency    Colon, diverticulosis Sept. 2011   outpatient colonoscopy by Dr. Cristina Gong.  Need record   DDD (degenerative disc disease), lumbosacral    Depression    DJD (degenerative joint disease), multiple sites    Low back pain worst   GERD (gastroesophageal reflux disease)    Gout    Gout    Uric Acid level 4.2 on 300 allopurinol   History of cervical cancer 1972   Hx-TIA (transient ischemic attack) 05/13/2001   Right facial numbness   Hx-TIA (transient ischemic attack) 05/13/2001   Looking back in Epic, the patient had a hospitalization in July 2002 for evaluation of questionable TIA (s/s of right facial numbness associated with mild blurred vision in her right eye, diaphoresis, and mild confusion) with negative CT head and  negative MRI/MRA and was started on Plavix after being heparinized. She was to have possible follow up with Neurology for reevaluation of the need for Pl   Hyperlipidemia    Hypertension    Keloid 08/01/2018   Memory disorder 12/20/2016   Mitral valve prolapse 10/27/1999   Subsequent 2D echo show normal mitral valve   Osteoarthritis of hip    bilateral hips   Ovarian cancer ([place]) 1972   S/P oophorectomy   Psychosis ([place])    Recurrent boils    History of MRSA skin infections with abscess   Sensorineural hearing loss of both ears    Subdural hematoma ([place]) 01/26/2017   bilateral   Uncomplicated opioid dependence ([place]) 01/31/2018    Patient Active Problem List   Diagnosis Date Noted   Debility 11/06/2019   Venous stasis of lower extremity 10/14/2019   Hyperglycemia 05/06/2018   Constipation 04/30/2018   Osteoarthritis 01/31/2018   Lower extremity edema 07/31/2017   History of bilateral hip replacements    Memory disorder 12/20/2016   History of lumbar laminectomy for spinal cord decompression 02/02/2016   Atopic dermatitis 07/09/2015   Reactive airway disease 08/11/2014   History of cancer 08/11/2014   OSA (obstructive sleep apnea) 08/11/2014   Chronic kidney disease (CKD), stage III (moderate) ([place]) 10/04/2012   Schizophrenia, chronic condition ([place]) 01/20/2012   Postmenopausal atrophic vaginitis 11/01/2010   Chronic diastolic heart failure ([place]) 05/06/2010   Allergic rhinitis 10/14/2009  Obesity, Class II, BMI 35-39.9, with comorbidity 05/14/2009   Chronic prescription opiate use 04/29/2008   Normocytic anemia 09/25/2007   Hyperlipidemia 09/03/2006   Gout 09/03/2006   Essential hypertension 09/03/2006   GERD 09/03/2006    Past Surgical History:  Procedure Laterality Date   ABDOMINAL HYSTERECTOMY     1972   BURR HOLE Bilateral 02/12/2017   Procedure: Haskell Flirt;  Surgeon: Earnie Larsson, MD;  Location: [place];  Service: Neurosurgery;   Laterality: Bilateral;   CERVICAL DISCECTOMY  7/07   C5-C6   JOINT REPLACEMENT     bilateral hip replacement   LUMBAR DISC SURGERY     L5-S1 7/07   LUMBAR LAMINECTOMY/DECOMPRESSION MICRODISCECTOMY N/A 02/02/2016   Procedure: L4-5 Decompression;  Surgeon: Marybelle Killings, MD;  Location: [place];  Service: Orthopedics;  Laterality: N/A;   OOPHORECTOMY     1972 for ovarian cancer     OB History   No obstetric history on file.     Family History  Problem Relation Age of Onset   Diabetes Mother    Heart disease Mother    Hearing loss Mother    Stroke Mother    Post-traumatic stress disorder Father    Schizophrenia Maternal Grandmother    Diabetes Maternal Grandfather    Other Brother        brother died in mental health [facility]   Kidney cancer Neg Hx    Bladder Cancer Neg Hx     Social History   Tobacco Use   Smoking status: Former Smoker    Packs/day: 0.10    Years: 35.00    Pack years: 3.50    Types: Cigarettes    Quit date: 04/08/2013    Years since quitting: 7.5   Smokeless tobacco: Never Used   Tobacco comment: smoked a few when her son died in 2013-01-21   Vaping Use   Vaping Use: Never used  Substance Use Topics   Alcohol use: No    Alcohol/week: 0.0 standard drinks   Drug use: No    Home Medications Prior to Admission medications   Medication Sig Start Date End Date Taking? Authorizing Provider  acetaminophen (TYLENOL) 500 MG tablet Take 500 mg by mouth at bedtime.     [provider]  albuterol (VENTOLIN HFA) 108 (90 Base) MCG/ACT inhaler Inhale 2 puffs into the lungs every 6 (six) hours as needed for wheezing or shortness of breath. 09/30/20   Vivi Barrack, MD  allopurinol (ZYLOPRIM) 300 MG tablet TAKE 1 TABLET BY MOUTH  DAILY 07/29/20   Vivi Barrack, MD  amLODipine (NORVASC) 10 MG tablet TAKE 1 TABLET BY MOUTH  DAILY 10/05/20   Vivi Barrack, MD  ascorbic acid (VITAMIN C) 500 MG tablet Take 500 mg by mouth daily.     [provider]  atorvastatin (LIPITOR) 40 MG tablet TAKE 1 TABLET BY MOUTH  DAILY 07/29/20   Vivi Barrack, MD  budesonide (PULMICORT) 0.25 MG/2ML nebulizer solution Take 2 mLs (0.25 mg total) by nebulization 2 (two) times daily. 10/07/20   Freada Bergeron, MD  buPROPion (WELLBUTRIN XL) 300 MG 24 hr tablet Take 1 tablet (300 mg total) by mouth every morning. 08/31/20   Arfeen, Arlyce Harman, MD  carvedilol (COREG) 6.25 MG tablet Take 1 tablet (6.25 mg total) by mouth 2 (two) times daily. 10/07/20   Freada Bergeron, MD  Cyanocobalamin (VITAMIN B12) 1000 MCG TBCR Take 1 tablet by mouth daily.    [provider]  diclofenac Sodium (VOLTAREN) 1 % GEL Apply 4 g topically 4 (four) times daily as needed (for pain).    [provider]  fluticasone (FLONASE) 50 MCG/ACT nasal spray Place into both nostrils as needed for allergies or rhinitis.    [provider]  haloperidol (HALDOL) 0.5 MG tablet Take 0.5 mg by mouth 2 (two) times daily.    [provider]  hydrOXYzine (VISTARIL) 25 MG capsule TAKE 1 CAPSULE BY MOUTH AT  BEDTIME 08/04/20   Vivi Barrack, MD  ipratropium-albuterol (DUONEB) 0.5-2.5 (3) MG/3ML SOLN Take 3 mLs by nebulization 2 (two) times daily. 09/25/20   Swayze, Ava, DO  losartan (COZAAR) 100 MG tablet Take 1 tablet (100 mg total) by mouth daily. 07/29/20   Vivi Barrack, MD  pantoprazole (PROTONIX) 40 MG tablet TAKE 1 TABLET BY MOUTH  TWICE DAILY BEFORE A MEAL 10/05/20   Vivi Barrack, MD  predniSONE (DELTASONE) 20 MG tablet Take 3 tablets (60 mg total) by mouth daily for 3 days, THEN 2.5 tablets (50 mg total) daily for 3 days, THEN 2 tablets (40 mg total) daily for 3 days, THEN 1.5 tablets (30 mg total) daily for 3 days, THEN 1 tablet (20 mg total) daily for 3 days, THEN 0.5 tablets (10 mg total) daily for 3 days. 09/25/20 10/13/20  Swayze, Ava, DO  torsemide (DEMADEX) 20 MG tablet Take 2 tablets (40 mg total) by mouth daily. 10/07/20   Freada Bergeron, MD  traMADol (ULTRAM) 50 MG tablet TAKE 1 TABLET BY MOUTH  TWICE DAILY AS NEEDED 05/28/20   Vivi Barrack, MD    Allergies    Aspirin and Sulfamethoxazole-trimethoprim  Review of Systems   Review of Systems  Constitutional: Negative for diaphoresis and fever.  HENT: Negative for sore throat.   Eyes: Negative for visual disturbance.  Respiratory: Negative for cough and shortness of breath.   Cardiovascular: Positive for chest pain and leg swelling.  Gastrointestinal: Negative for abdominal pain, nausea and vomiting.  Genitourinary: Negative for dysuria.  Musculoskeletal: Negative for neck pain.  Skin: Negative for rash.  Neurological: Negative for headaches.    Physical Exam Updated Vital Signs BP 125/72 (BP Location: Right Arm)    Pulse 86    Temp 99.1 F (37.3 C) (Oral)    Resp 16    Ht 5' 3.25" (1.607 m)    Wt 127.3 kg    SpO2 99%    BMI 49.32 kg/m   Physical Exam Vitals and nursing note reviewed.  Constitutional:      General: She is not in acute distress.    Appearance: Normal appearance. She is well-developed and well-nourished. She is obese.  HENT:     Head: Normocephalic and atraumatic.  Eyes:     Conjunctiva/sclera: Conjunctivae normal.  Cardiovascular:     Rate and Rhythm: Normal rate and regular rhythm.     Heart sounds: No murmur heard.   Pulmonary:     Effort: Pulmonary effort is normal. No respiratory distress.     Breath sounds: Normal breath sounds.  Abdominal:     Palpations: Abdomen is soft.     Tenderness: There is no abdominal tenderness.  Musculoskeletal:        General: No deformity.     Cervical back: Neck supple.     Right lower leg: Edema present.     Left lower leg: Edema present.  Skin:    General: Skin is warm and dry.  Capillary Refill: Capillary refill takes less than 2 seconds.  Neurological:     General: No focal deficit present.     Mental Status: She is alert.  Psychiatric:        Mood and Affect: Mood and affect  normal.     ED Results / Procedures / Treatments   Labs (all labs ordered are listed, but only abnormal results are displayed) Labs Reviewed  BASIC METABOLIC PANEL - Abnormal; Notable for the following components:      Result Value   Glucose, Bld 120 (*)    Creatinine, Ser 1.58 (*)    GFR, Estimated 33 (*)    All other components within normal limits  CBC - Abnormal; Notable for the following components:   RDW 17.3 (*)    Platelets 146 (*)    All other components within normal limits  BRAIN NATRIURETIC PEPTIDE - Abnormal; Notable for the following components:   B Natriuretic Peptide 190.6 (*)    All other components within normal limits  TROPONIN I (HIGH SENSITIVITY)  TROPONIN I (HIGH SENSITIVITY)    EKG None  Radiology DG Chest 2 View  Result Date: 10/13/2020 CLINICAL DATA:  Chest pain today. Shortness of breath. EXAM: CHEST - 2 VIEW COMPARISON:  09/22/2020 FINDINGS: Borderline cardiomegaly. Tortuous thoracic aorta. Chronic peribronchial thickening. Streaky atelectasis or scarring at the left lung base. No acute airspace disease. No pneumothorax or large pleural effusion. Stable osseous structures. IMPRESSION: Persistent streaky opacity at the left lung base, favoring atelectasis or scarring. There is background peribronchial thickening that appears chronic. Electronically Signed   By: Keith Rake M.D.   On: 10/13/2020 20:14    Procedures Procedures (including critical care time)  Medications Ordered in ED Medications  furosemide (LASIX) injection 40 mg (40 mg Intravenous Given 10/13/20 2227)    ED Course  I have reviewed the triage vital signs and the nursing notes.  Pertinent labs & imaging results that were available during my care of the patient were reviewed by me and considered in my medical decision making (see chart for details).  Clinical Course as of 10/14/20 1131  Wed Oct 13, 2020  2018 Chest x-ray with some atelectasis left base no gross  infiltrates. [MB]  2030 ECG showing sinus rhythm with PVC, otherwise normal intervals, no acute ST-T changes.  Unchanged from 11/21 [MB]  2153 Patient's daughter is here now unable to provide a little more details.  She said she was recently admitted in diuresed.  Since she has been home she started to gain fluid on the legs and have more shortness of breath again.  She said the oral diuretics.do not seem to be doing as much now.  Although she has not gained more than a few pounds. [MB]    Clinical Course User Index [MB] Hayden Rasmussen, MD   MDM Rules/Calculators/A&P                         This patient complains of chest pain shortness of breath peripheral edema; this involves an extensive number of treatment Options and is a complaint that carries with it a high risk of complications and Morbidity. The differential includes ACS, CHF, peripheral edema, anemia, metabolic derangement, pneumonia, renal impairment  I ordered, reviewed and interpreted labs, which included CBC with normal white count normal hemoglobin, chemistries with elevated glucose elevated creatinine but better than baseline troponins flat, BNP mildly elevated I ordered medication IV Lasix with good diuresis I  ordered imaging studies which included chest x-ray and I independently    visualized and interpreted imaging which showed atelectasis left base, no gross fluid overload Additional history obtained from patient's daughter Previous records obtained and reviewed in epic, she was admitted last month for fluid overload and diuresed  After the interventions stated above, I reevaluated the patient and found patient to be symptomatically improved.  She satting 100% on room air lying flat in the bed.  Have placed orders for a face-to-face home heart failure.  Patient and daughter are comfortable plan for return home with increased services.  Return instructions discussed   Final Clinical Impression(s) / ED Diagnoses Final  diagnoses:  Nonspecific chest pain  Acute on chronic diastolic CHF (congestive heart failure) ([place])    Rx / DC Orders ED Discharge Orders    None       Hayden Rasmussen, MD 10/14/20 1133

## 2020-10-13 NOTE — ED Triage Notes (Signed)
The pt arrived by [facility] from home where the pt lives with her daughter.  The pt is VERY hard of heraing  The pts daughter would not let ems start iv fluids on this pt.  The pt is alert

## 2020-10-14 ENCOUNTER — Telehealth: Payer: Self-pay

## 2020-10-14 ENCOUNTER — Telehealth: Payer: Self-pay | Admitting: Cardiology

## 2020-10-14 ENCOUNTER — Ambulatory Visit: Payer: Medicare Other | Admitting: Cardiology

## 2020-10-14 NOTE — Telephone Encounter (Signed)
Daughter home health services and is going to try to get her readmitted due to shortness of breathe with excursion and fluid in legs  Patient was stable. BP was high 146/80  But that was daughters choice to get her readmitted

## 2020-10-14 NOTE — ED Notes (Signed)
Patient verbalizes understanding of discharge instructions. Opportunity for questioning and answers were provided. Armband removed by staff, pt discharged from ED in wheelchair to home with family.   

## 2020-10-14 NOTE — Telephone Encounter (Signed)
     Daughter decided she did not want to send a message

## 2020-10-14 NOTE — Telephone Encounter (Signed)
[facility] went to home of patient and daughter declined and stated she requested some massage therapy for patient  If there are any questions brittany from [facility] [phone]

## 2020-10-18 ENCOUNTER — Telehealth: Payer: Self-pay

## 2020-10-18 DIAGNOSIS — I10 Essential (primary) hypertension: Secondary | ICD-10-CM

## 2020-10-18 DIAGNOSIS — I5033 Acute on chronic diastolic (congestive) heart failure: Secondary | ICD-10-CM

## 2020-10-18 NOTE — Telephone Encounter (Signed)
See below note.

## 2020-10-18 NOTE — Telephone Encounter (Signed)
-----   Message from Freada Bergeron, MD sent at 10/18/2020  8:28 AM EST ----- Do you mind checking in on the patient? We were trying to aggressively diurese as an out-patient but she was very grossly volume up. If she has not been hospitalized, can we ensure she gets a BMET this week and if possible an appointment with an APP. She is very tenuous and unfortunately I was not able to see her last week as I was sick.  Thank you so much!  -Nira Conn

## 2020-10-18 NOTE — Telephone Encounter (Signed)
Follow up  Pts daughter is returning call to [facility], she says call was disconnected   Please call back

## 2020-10-18 NOTE — Telephone Encounter (Signed)
Spoke with the patient's daughter, [name]. She states that the patient is doing well. She did go to the ER last Wednesday 12/15 due to some difficulty breathing. The daughter states that they gave the patient some IV lasix and were able to send her home that evening. She states that the patient has been doing good since then. Her weight this morning was 269.6lb which is down about 10lbs. Her BP this morning was 126/69, HR 87. Offered patient and appointment on 12/22 with Dr. Johney Frame. The daughter states that they are unable to come in this week for blood work or an appt.   She will come for blood work 12/28. Patient's daughter will continue to monitor BP and weight at home.

## 2020-10-18 NOTE — Telephone Encounter (Signed)
Please see other phone note. What do we need to do?  [name]. [name], MD 10/18/2020 3:49 PM

## 2020-10-18 NOTE — Telephone Encounter (Signed)
Left message for patient to call back  

## 2020-10-18 NOTE — Telephone Encounter (Signed)
Spoke with the patient's daughter again. We will keep plan for lab work next week. She will let us know if the patient gains any weight or BP is abnormal.

## 2020-10-18 NOTE — Telephone Encounter (Signed)
See note below

## 2020-10-18 NOTE — Telephone Encounter (Signed)
Thank you so much for reaching out to them! Really appreciate it. I am glad she got some IV diuresis and we can follow-up her labs next week. Again, thank you!

## 2020-10-18 NOTE — Telephone Encounter (Signed)
Patient returning [place] call

## 2020-10-19 ENCOUNTER — Telehealth: Payer: Self-pay

## 2020-10-19 NOTE — Telephone Encounter (Signed)
error 

## 2020-10-19 NOTE — Progress Notes (Signed)
Cardiology Office Note:    Date:  10/20/2020   ID:  [name], DOB Nov 29, 1940, MRN [id number]  PCP:  Vivi Barrack, MD  [facility] Cardiologist:  Freada Bergeron, MD  [facility] Electrophysiologist:  None   Referring MD: Vivi Barrack, MD     History of Present Illness:    [name] is a 79 y.o. female with a hx of chronic diastolic HF, TIA, schizophrenia, dementia, CKD, and ovarian cancer s/p oophrectomy who is undergoing diuresis for significant volume overload in the setting of CKD and diastolic HF now presenting to clinic for follow-up.  Patient admitted from 11/24-11/27 for worsening SOB and hypoxia. She was diuresed with IV lasix and treated with antibiotics for COPD exacerbation. Her O2 status improved and she was discharged home.Daughter admits that she left the hospital prematurely as she was concerned about the lasix affecting her renal function and decided to take the patient home.  Saw Richardson Dopp on 12/01. During that visit, the patient was continuing to have shortness of breath but was overall improved since admission. Weight was stable but with continued LE edema. She was started on lasix 40mg  daily and imdur 30mg  daily. She presents today for follow-up.  During our last visit on 10/07/20, patient's daughter expressedthat she has been concerned about her mother's fluid status and kidney function for over a year. She was worried that lasix would make her renal function worse as she was recently taken off the medication due to rising Cr. She admits that she left the hospital prematurely as she was concerned about her mother getting IV lasix and the changes in her kidney function. She has done more research since that time and understands that the HF likely is contributing to her elevated fluid levels and may be making the kidney function worse. She was amenable to resuming diuresis. We started her on torsemide 40mg  daily with plans for follow-up  1 week later with repeat BMP at that time.  Prior to her visit, the patient went to the ED 12/15 with chest pain. Trop negative. BNP 190. CXR with some atelectasis in left lung base but no significant effusions or pulmonary edema. Cr 1.58. She was given IV lasix with good response and discharged home.  Today, the daughter states that the patient is intermittently more confused and out of it. She states that she sometimes she just waxes and wanes with her underlying dementia and can become more anxious. When she is anxious, she feels more short of breath which she thinks was the primary driver of her going to the ER. Otherwise, she is doing well. Urinating frequently on torsemide. No fevers, chills, chest pain. She continues to have her chronic DOE and orthopnea. LE edema is still profound but maybe mildly improved. They have been attempting to wrap her legs for added compression.  Past Medical History:  Diagnosis Date  . Chronic venous insufficiency   . Colon, diverticulosis Sept. 2011   outpatient colonoscopy by Dr. Cristina Gong.  Need record  . DDD (degenerative disc disease), lumbosacral   . Depression   . DJD (degenerative joint disease), multiple sites    Low back pain worst  . GERD (gastroesophageal reflux disease)   . Gout   . Gout    Uric Acid level 4.2 on 300 allopurinol  . History of cervical cancer 1972  . Hx-TIA (transient ischemic attack) 05/13/2001   Right facial numbness  . Hx-TIA (transient ischemic attack) 05/13/2001   Looking back  in [place], the patient had a hospitalization in July 2002 for evaluation of questionable TIA (s/s of right facial numbness associated with mild blurred vision in her right eye, diaphoresis, and mild confusion) with negative CT head and negative MRI/MRA and was started on Plavix after being heparinized. She was to have possible follow up with Neurology for reevaluation of the need for Pl  . Hyperlipidemia   . Hypertension   . Keloid 08/01/2018  . Memory  disorder 12/20/2016  . Mitral valve prolapse 10/27/1999   Subsequent 2D echo show normal mitral valve  . Osteoarthritis of hip    bilateral hips  . Ovarian cancer ([place]) 1972   S/P oophorectomy  . Psychosis ([place])   . Recurrent boils    History of MRSA skin infections with abscess  . Sensorineural hearing loss of both ears   . Subdural hematoma ([place]) 01/26/2017   bilateral  . Uncomplicated opioid dependence ([place]) 01/31/2018    Past Surgical History:  Procedure Laterality Date  . ABDOMINAL HYSTERECTOMY     1972  . BURR HOLE Bilateral 02/12/2017   Procedure: Haskell Flirt;  Surgeon: Earnie Larsson, MD;  Location: [place];  Service: Neurosurgery;  Laterality: Bilateral;  . CERVICAL DISCECTOMY  7/07   C5-C6  . JOINT REPLACEMENT     bilateral hip replacement  . LUMBAR DISC SURGERY     L5-S1 7/07  . LUMBAR LAMINECTOMY/DECOMPRESSION MICRODISCECTOMY N/A 02/02/2016   Procedure: L4-5 Decompression;  Surgeon: Marybelle Killings, MD;  Location: [place];  Service: Orthopedics;  Laterality: N/A;  . [place] for ovarian cancer    Current Medications: Current Meds  Medication Sig  . acetaminophen (TYLENOL) 500 MG tablet Take 500 mg by mouth at bedtime.   [name] albuterol (VENTOLIN HFA) 108 (90 Base) MCG/ACT inhaler Inhale 2 puffs into the lungs every 6 (six) hours as needed for wheezing or shortness of breath.  . allopurinol (ZYLOPRIM) 300 MG tablet TAKE 1 TABLET BY MOUTH  DAILY  . amLODipine (NORVASC) 10 MG tablet TAKE 1 TABLET BY MOUTH  DAILY  . ascorbic acid (VITAMIN C) 500 MG tablet Take 500 mg by mouth daily.   [name] atorvastatin (LIPITOR) 40 MG tablet TAKE 1 TABLET BY MOUTH  DAILY  . budesonide (PULMICORT) 0.25 MG/2ML nebulizer solution Take 2 mLs (0.25 mg total) by nebulization 2 (two) times daily.  [name] buPROPion (WELLBUTRIN XL) 300 MG 24 hr tablet Take 1 tablet (300 mg total) by mouth every morning.  . carvedilol (COREG) 6.25 MG tablet Take 1 tablet (6.25 mg total) by mouth 2 (two) times daily.  .  Cyanocobalamin (VITAMIN B12) 1000 MCG TBCR Take 1 tablet by mouth daily.  . diclofenac Sodium (VOLTAREN) 1 % GEL Apply 4 g topically 4 (four) times daily as needed (for pain).  . fluticasone (FLONASE) 50 MCG/ACT nasal spray Place into both nostrils as needed for allergies or rhinitis.  . haloperidol (HALDOL) 0.5 MG tablet Take 0.5 mg by mouth 2 (two) times daily.  . hydrOXYzine (VISTARIL) 25 MG capsule TAKE 1 CAPSULE BY MOUTH AT  BEDTIME  . ipratropium-albuterol (DUONEB) 0.5-2.5 (3) MG/3ML SOLN Take 3 mLs by nebulization 2 (two) times daily.  [name] losartan (COZAAR) 100 MG tablet Take 1 tablet (100 mg total) by mouth daily.  . pantoprazole (PROTONIX) 40 MG tablet TAKE 1 TABLET BY MOUTH  TWICE DAILY BEFORE A MEAL  . torsemide (DEMADEX) 20 MG tablet Take 2 tablets (40 mg total) by mouth daily.  . traMADol (  ULTRAM) 50 MG tablet TAKE 1 TABLET BY MOUTH  TWICE DAILY AS NEEDED     Allergies:   Aspirin and Sulfamethoxazole-trimethoprim   Social History   Socioeconomic History  . Marital status: Widowed    Spouse name: Not on file  . Number of children: 3  . Years of education: 70  . Highest education level: Not on file  Occupational History  . Occupation: Retired  Tobacco Use  . Smoking status: Former Smoker    Packs/day: 0.10    Years: 35.00    Pack years: 3.50    Types: Cigarettes    Quit date: 04/08/2013    Years since quitting: 7.5  . Smokeless tobacco: Never Used  . Tobacco comment: smoked a few when her son died in 02-09-2013   Vaping Use  . Vaping Use: Never used  Substance and Sexual Activity  . Alcohol use: No    Alcohol/week: 0.0 standard drinks  . Drug use: No  . Sexual activity: Never  Other Topics Concern  . Not on file  Social History Narrative   Lives with daughter in [place]. Daughter is primary caretaker, surrogate decision-maker, and arranges for [facility] aide and other caregivers to supervise Ms. Amsden at home. Ambulatory   Social Determinants of Health   Financial  Resource Strain: Not on file  Food Insecurity: Not on file  Transportation Needs: No Transportation Needs  . Lack of Transportation (Medical): No  . Lack of Transportation (Non-Medical): No  Physical Activity: Not on file  Stress: Not on file  Social Connections: Not on file     Family History: The patient's family history includes Diabetes in her maternal grandfather and mother; Hearing loss in her mother; Heart disease in her mother; Other in her brother; Post-traumatic stress disorder in her father; Schizophrenia in her maternal grandmother; Stroke in her mother. There is no history of Kidney cancer or Bladder Cancer.  ROS:   Please see the history of present illness.    Review of Systems  Constitutional: Negative for chills and fever.  HENT: Negative for hearing loss.   Eyes: Negative for blurred vision.  Respiratory: Positive for shortness of breath.   Cardiovascular: Positive for chest pain, orthopnea and leg swelling. Negative for palpitations, claudication and PND.  Gastrointestinal: Negative for nausea and vomiting.  Genitourinary: Negative for hematuria.  Musculoskeletal: Positive for joint pain and myalgias.  Neurological: Negative for dizziness and loss of consciousness.  Psychiatric/Behavioral: The patient is nervous/anxious.     EKGs/Labs/Other Studies Reviewed:    The following studies were reviewed today: TTE 10/15/2020: IMPRESSIONS  1. Left ventricular ejection fraction, by estimation, is 65 to 70%. The  left ventricle has normal function. The left ventricle has no regional  wall motion abnormalities. There is mild left ventricular hypertrophy.  Left ventricular diastolic parameters  are consistent with Grade I diastolic dysfunction (impaired relaxation).  2. Right ventricular systolic function is normal. The right ventricular  size is normal. There is mildly elevated pulmonary artery systolic  pressure.  3. The mitral valve is normal in structure. No  evidence of mitral valve  regurgitation. No evidence of mitral stenosis.  4. The aortic valve is normal in structure. Aortic valve regurgitation is  not visualized. No aortic stenosis is present.  5. The inferior vena cava is normal in size with greater than 50%  respiratory variability, suggesting right atrial pressure of 3 mmHg.   FINDINGS  Left Ventricle: Left ventricular ejection fraction, by estimation, is 65  to  70%. The left ventricle has normal function. The left ventricle has no  regional wall motion abnormalities. The left ventricular internal cavity  size was normal in size. There is  mild left ventricular hypertrophy. Left ventricular diastolic parameters  are consistent with Grade I diastolic dysfunction (impaired relaxation).   Right Ventricle: The right ventricular size is normal. No increase in  right ventricular wall thickness. Right ventricular systolic function is  normal. There is mildly elevated pulmonary artery systolic pressure. The  tricuspid regurgitant velocity is 2.78  m/s, and with an assumed right atrial pressure of 8 mmHg, the estimated  right ventricular systolic pressure is 62.8 mmHg.   Left Atrium: Left atrial size was normal in size.   Right Atrium: Right atrial size was normal in size.   Pericardium: There is no evidence of pericardial effusion.   Mitral Valve: The mitral valve is normal in structure. No evidence of  mitral valve regurgitation. No evidence of mitral valve stenosis.   Tricuspid Valve: The tricuspid valve is normal in structure. Tricuspid  valve regurgitation is not demonstrated. No evidence of tricuspid  stenosis.   Aortic Valve: The aortic valve is normal in structure. Aortic valve  regurgitation is not visualized. No aortic stenosis is present.   Pulmonic Valve: The pulmonic valve was normal in structure. Pulmonic valve  regurgitation is not visualized. No evidence of pulmonic stenosis.   Aorta: The aortic root is  normal in size and structure.   Venous: The inferior vena cava is normal in size with greater than 50%  respiratory variability, suggesting right atrial pressure of 3 mmHg.   IAS/Shunts: No atrial level shunt detected by color flow Doppler.    EKG:  The ekg 10/14/20 demonstrates NSR with PVC  Recent Labs: 08/30/2020: TSH 4.62 09/23/2020: ALT 21 10/13/2020: B Natriuretic Peptide 190.6; BUN 20; Creatinine, Ser 1.58; Hemoglobin 13.1; Platelets 146; Potassium 4.5; Sodium 143  Recent Lipid Panel    Component Value Date/Time   CHOL 212 (H) 08/30/2020 1546   TRIG 136 08/30/2020 1546   HDL 49 (L) 08/30/2020 1546   CHOLHDL 4.3 08/30/2020 1546   VLDL 23.6 04/30/2018 1546   LDLCALC 137 (H) 08/30/2020 1546     Physical Exam:    VS:  BP 128/64   Pulse 83   Ht 5' 3.25" (1.607 m)   Wt 280 lb 3.2 oz (127.1 kg)   SpO2 96%   BMI 49.24 kg/m     Wt Readings from Last 3 Encounters:  10/20/20 280 lb 3.2 oz (127.1 kg)  10/13/20 280 lb 10.3 oz (127.3 kg)  10/07/20 280 lb 9.6 oz (127.3 kg)     GEN:  Comfortable, sitting up in her wheelchair. Intermittently answers questions.  HEENT: Normal NECK: No JVD; No carotid bruits CARDIAC: RRR, no murmurs, rubs, gallops RESPIRATORY:  Clear to auscultation without rales, wheezing or rhonchi  ABDOMEN: obese, soft, non-tender, non-distended MUSCULOSKELETAL:  3+ pitting edema to the thighs SKIN: Warm and dry NEUROLOGIC:  Pleasant and intermittently answers questions. Grossly non-focal.  ASSESSMENT:    1. Acute on chronic diastolic (congestive) heart failure (HCC)   2. Stage 3b chronic kidney disease ([place])   3. Essential hypertension   4. Hyperlipidemia, unspecified hyperlipidemia type   5. Shortness of breath    PLAN:    In order of problems listed above:  #Acute on chronic diastolic heart failure: LVEF 65-70%. Continued significant LE edema, but slightly improved. Renal function stable on 12/15 to slightly improved. BNP 191. CXR without  pulmonary edema. Diuresing well with torsemide.  -Continue torsemide 40mg  daily -Repeat BMET today; if stable Cr, will continue current diuretic plan. If elevated, will switch to alternating days of 20mg  daily and 40mg  daily of torsemide  -Continue coreg to 6.25mg  BID -Continue losartan 100mg  daily -Compression wraps to the lower extremities -Monitor daily weights and record them for [place] to review -Low Na diet  #Shortness of breath: Likely multifactorial in the setting of deconditioning, morbid obesity, chronic HFpEF, reactive airway disease. Overall symptoms improved since last visit.CXR with no evidence of pulmonary edema. -Diuresis as above -Continue inhalers and neb treatments -Follow-up with pulm as scheduled  #HTN: Improved today in 120s -Diuresis as above -Continue coreg as above -Continue amlodipine 10mg  daily -Continue losartan 100mg  daily  #CKD: Stable to improving Cr with diuresis. -Agree with nephology referral -Diuresis as above -Repeat BMET today   #HLD: -Continue atorvasatin 40mg  daily    Medication Adjustments/Labs and Tests Ordered: Current medicines are reviewed at length with the patient today.  Concerns regarding medicines are outlined above.  Orders Placed This Encounter  Procedures  . Basic metabolic panel   No orders of the defined types were placed in this encounter.   Patient Instructions  Medication Instructions:  Your physician recommends that you continue on your current medications as directed. Please refer to the Current Medication list given to you today.  *If you need a refill on your cardiac medications before your next appointment, please call your pharmacy*   Lab Work: BMET Today  If you have labs (blood work) drawn today and your tests are completely normal, you will receive your results only by: [name] MyChart Message (if you have MyChart) OR . A paper copy in the mail If you have any lab test that is abnormal or we need to  change your treatment, we will call you to review the results.   Follow-Up: At [facility], you and your health needs are our priority.  As part of our continuing mission to provide you with exceptional heart care, we have created designated Provider Care Teams.  These Care Teams include your primary Cardiologist (physician) and Advanced Practice Providers (APPs -  Physician Assistants and Nurse Practitioners) who all work together to provide you with the care you need, when you need it.   Your next appointment:   11/23/2020 with Dr Johney Frame    Signed, Freada Bergeron, MD  10/20/2020 10:41 AM    [place]

## 2020-10-19 NOTE — Telephone Encounter (Signed)
Patient's daughter called back today and states that her mother is not feeling well today. She states that she seems to be more fatigued. Her weight today was 276.5lb and is unsure whether the weight yesterday of 269.6lb was correct. Still has some swelling and SOB. She states her BP this morning was 100/49. She denies any dizziness. The patient has been scheduled to see Dr. Johney Frame tomorrow 12/22.

## 2020-10-20 ENCOUNTER — Other Ambulatory Visit: Payer: Medicare Other | Admitting: *Deleted

## 2020-10-20 ENCOUNTER — Encounter: Payer: Self-pay | Admitting: Cardiology

## 2020-10-20 ENCOUNTER — Ambulatory Visit (INDEPENDENT_AMBULATORY_CARE_PROVIDER_SITE_OTHER): Payer: Medicare Other | Admitting: Cardiology

## 2020-10-20 ENCOUNTER — Other Ambulatory Visit: Payer: Self-pay

## 2020-10-20 VITALS — BP 128/64 | HR 83 | Ht 63.25 in | Wt 280.2 lb

## 2020-10-20 DIAGNOSIS — N1832 Chronic kidney disease, stage 3b: Secondary | ICD-10-CM | POA: Diagnosis not present

## 2020-10-20 DIAGNOSIS — I5033 Acute on chronic diastolic (congestive) heart failure: Secondary | ICD-10-CM

## 2020-10-20 DIAGNOSIS — I10 Essential (primary) hypertension: Secondary | ICD-10-CM | POA: Diagnosis not present

## 2020-10-20 DIAGNOSIS — E785 Hyperlipidemia, unspecified: Secondary | ICD-10-CM | POA: Diagnosis not present

## 2020-10-20 DIAGNOSIS — R0602 Shortness of breath: Secondary | ICD-10-CM

## 2020-10-20 LAB — BASIC METABOLIC PANEL
BUN/Creatinine Ratio: 12 (ref 12–28)
BUN: 18 mg/dL (ref 8–27)
CO2: 18 mmol/L — ABNORMAL LOW (ref 20–29)
Calcium: 9.1 mg/dL (ref 8.7–10.3)
Chloride: 109 mmol/L — ABNORMAL HIGH (ref 96–106)
Creatinine, Ser: 1.52 mg/dL — ABNORMAL HIGH (ref 0.57–1.00)
GFR calc Af Amer: 37 mL/min/{1.73_m2} — ABNORMAL LOW (ref >59)
GFR calc non Af Amer: 32 mL/min/{1.73_m2} — ABNORMAL LOW (ref >59)
Glucose: 132 mg/dL — ABNORMAL HIGH (ref 65–99)
Potassium: 4 mmol/L (ref 3.5–5.2)
Sodium: 143 mmol/L (ref 134–144)

## 2020-10-20 NOTE — Patient Instructions (Signed)
Medication Instructions:  Your physician recommends that you continue on your current medications as directed. Please refer to the Current Medication list given to you today.  *If you need a refill on your cardiac medications before your next appointment, please call your pharmacy*   Lab Work: BMET Today  If you have labs (blood work) drawn today and your tests are completely normal, you will receive your results only by: [name] MyChart Message (if you have MyChart) OR . A paper copy in the mail If you have any lab test that is abnormal or we need to change your treatment, we will call you to review the results.   Follow-Up: At [facility], you and your health needs are our priority.  As part of our continuing mission to provide you with exceptional heart care, we have created designated Provider Care Teams.  These Care Teams include your primary Cardiologist (physician) and Advanced Practice Providers (APPs -  Physician Assistants and Nurse Practitioners) who all work together to provide you with the care you need, when you need it.   Your next appointment:   11/23/2020 with Dr Johney Frame

## 2020-10-25 ENCOUNTER — Telehealth: Payer: Self-pay | Admitting: Cardiology

## 2020-10-25 DIAGNOSIS — R269 Unspecified abnormalities of gait and mobility: Secondary | ICD-10-CM | POA: Diagnosis not present

## 2020-10-25 DIAGNOSIS — I509 Heart failure, unspecified: Secondary | ICD-10-CM | POA: Diagnosis not present

## 2020-10-25 DIAGNOSIS — R2681 Unsteadiness on feet: Secondary | ICD-10-CM | POA: Diagnosis not present

## 2020-10-25 DIAGNOSIS — M25559 Pain in unspecified hip: Secondary | ICD-10-CM | POA: Diagnosis not present

## 2020-10-25 NOTE — Telephone Encounter (Signed)
New message:      Patient daughter calling concering some medication that her mother is taking. Patient daughter would like for the nurse to call back. ( would like to speak with Albin Felling)

## 2020-10-25 NOTE — Telephone Encounter (Signed)
The patients daughter was asking about a prednisone taper. Answered all questions and she verbalized understanding.

## 2020-10-26 ENCOUNTER — Other Ambulatory Visit: Payer: Medicare Other

## 2020-10-27 ENCOUNTER — Other Ambulatory Visit: Payer: Self-pay | Admitting: *Deleted

## 2020-10-27 NOTE — Patient Outreach (Signed)
[facility]) Care Management  10/27/2020  [name] 08-May-1941 [id number]   EMMI Red Flag Alert   Day #1 Date: 09/27/20 Red Alert Reason : Know who to call about changes in condition?, No   Outreach Attempt #4 Unsuccessful outreach call to patient , daughter [name] Designated party release no answer able to leave a HIPAA compliant voice mail message for return call.   Plan Will plan return call in the next  month as prior  scheduled in disease management program per workflow , prior unsuccessful outreaches   Egbert Garibaldi, RN, BSN  [facility] Care Management,Care Management Coordinator  [phone]- Mobile [phone]- Toll Free Main Office

## 2020-11-11 ENCOUNTER — Ambulatory Visit: Payer: Medicare Other | Admitting: Cardiology

## 2020-11-16 ENCOUNTER — Ambulatory Visit: Payer: Self-pay | Admitting: *Deleted

## 2020-11-18 NOTE — Progress Notes (Deleted)
Cardiology Office Note:    Date:  11/18/2020   ID:  [name], DOB [birth year], MRN [id number]  PCP:  Vivi Barrack, MD  [facility] Cardiologist:  Freada Bergeron, MD  [facility] Electrophysiologist:  None   Referring MD: Vivi Barrack, MD    History of Present Illness:    [name] is a 80 y.o. female with a hx of chronicdiastolicHF, TIA, schizophrenia, dementia, CKD, and ovarian cancer s/p oophrectomy whois undergoing diuresis for significant volume overload in the setting of CKD and diastolic HF now presenting to clinic for follow-up.  Patient admitted from 11/24-11/27 for worsening SOB and hypoxia. She was diuresed with IV lasix and treated with antibiotics for COPD exacerbation. Her O2 status improved and she was discharged home.Daughter admits that she left the hospital prematurely as she was concerned about the lasix affecting her renal function and decided to take the patient home.  SawScott Weaver on 09/29/20. During that visit, the patient was continuing to have shortness of breath but was overall improved since admission. Weight was stable but with continued LE edema. She was started on lasix 40mg  daily and imdur 30mg  daily. She presents today for follow-up.  On our visit on 10/07/20,we started her on torsemide 40mg  daily.Shortly after the visit, the patient went to the ED 12/15 with chest pain. Trop negative. BNP 190. CXR with some atelectasis in left lung base but no significant effusions or pulmonary edema. Cr 1.58. She was given IV lasix with good response and discharged home.  Last visit 10/20/20, she had continued LE edema but was overall improved  Past Medical History:  Diagnosis Date  . Chronic venous insufficiency   . Colon, diverticulosis Sept. 2011   outpatient colonoscopy by Dr. Cristina Gong.  Need record  . DDD (degenerative disc disease), lumbosacral   . Depression   . DJD (degenerative joint disease), multiple sites    Low  back pain worst  . GERD (gastroesophageal reflux disease)   . Gout   . Gout    Uric Acid level 4.2 on 300 allopurinol  . History of cervical cancer 1972  . Hx-TIA (transient ischemic attack) 05/13/2001   Right facial numbness  . Hx-TIA (transient ischemic attack) 05/13/2001   Looking back in Epic, the patient had a hospitalization in July 2002 for evaluation of questionable TIA (s/s of right facial numbness associated with mild blurred vision in her right eye, diaphoresis, and mild confusion) with negative CT head and negative MRI/MRA and was started on Plavix after being heparinized. She was to have possible follow up with Neurology for reevaluation of the need for Pl  . Hyperlipidemia   . Hypertension   . Keloid 08/01/2018  . Memory disorder 12/20/2016  . Mitral valve prolapse 10/27/1999   Subsequent 2D echo show normal mitral valve  . Osteoarthritis of hip    bilateral hips  . Ovarian cancer ([place]) 1972   S/P oophorectomy  . Psychosis ([place])   . Recurrent boils    History of MRSA skin infections with abscess  . Sensorineural hearing loss of both ears   . Subdural hematoma ([place]) 01/26/2017   bilateral  . Uncomplicated opioid dependence ([place]) 01/31/2018    Past Surgical History:  Procedure Laterality Date  . ABDOMINAL HYSTERECTOMY     1972  . BURR HOLE Bilateral 02/12/2017   Procedure: Haskell Flirt;  Surgeon: Earnie Larsson, MD;  Location: [facility];  Service: Neurosurgery;  Laterality: Bilateral;  . CERVICAL DISCECTOMY  7/07  C5-C6  . JOINT REPLACEMENT     bilateral hip replacement  . LUMBAR DISC SURGERY     L5-S1 7/07  . LUMBAR LAMINECTOMY/DECOMPRESSION MICRODISCECTOMY N/A 02/02/2016   Procedure: L4-5 Decompression;  Surgeon: Marybelle Killings, MD;  Location: [facility];  Service: Orthopedics;  Laterality: N/A;  . [place] for ovarian cancer    Current Medications: No outpatient medications have been marked as taking for the 11/26/20 encounter (Appointment) with Freada Bergeron,  MD.     Allergies:   Aspirin and Sulfamethoxazole-trimethoprim   Social History   Socioeconomic History  . Marital status: Widowed    Spouse name: Not on file  . Number of children: 3  . Years of education: 81  . Highest education level: Not on file  Occupational History  . Occupation: Retired  Tobacco Use  . Smoking status: Former Smoker    Packs/day: 0.10    Years: 35.00    Pack years: 3.50    Types: Cigarettes    Quit date: 04/08/2013    Years since quitting: 7.6  . Smokeless tobacco: Never Used  . Tobacco comment: smoked a few when her son died in 2013-02-17   Vaping Use  . Vaping Use: Never used  Substance and Sexual Activity  . Alcohol use: No    Alcohol/week: 0.0 standard drinks  . Drug use: No  . Sexual activity: Never  Other Topics Concern  . Not on file  Social History Narrative   Lives with daughter in [place]. Daughter is primary caretaker, surrogate decision-maker, and arranges for [facility] aide and other caregivers to supervise Ms. Zanetti at home. Ambulatory   Social Determinants of Health   Financial Resource Strain: Not on file  Food Insecurity: Not on file  Transportation Needs: No Transportation Needs  . Lack of Transportation (Medical): No  . Lack of Transportation (Non-Medical): No  Physical Activity: Not on file  Stress: Not on file  Social Connections: Not on file     Family History: The patient's ***family history includes Diabetes in her maternal grandfather and mother; Hearing loss in her mother; Heart disease in her mother; Other in her brother; Post-traumatic stress disorder in her father; Schizophrenia in her maternal grandmother; Stroke in her mother. There is no history of Kidney cancer or Bladder Cancer.  ROS:   Please see the history of present illness.    *** All other systems reviewed and are negative.  EKGs/Labs/Other Studies Reviewed:    The following studies were reviewed today: TTE 10/10/20: IMPRESSIONS  1. Left ventricular  ejection fraction, by estimation, is 65 to 70%. The  left ventricle has normal function. The left ventricle has no regional  wall motion abnormalities. There is mild left ventricular hypertrophy.  Left ventricular diastolic parameters  are consistent with Grade I diastolic dysfunction (impaired relaxation).  2. Right ventricular systolic function is normal. The right ventricular  size is normal. There is mildly elevated pulmonary artery systolic  pressure.  3. The mitral valve is normal in structure. No evidence of mitral valve  regurgitation. No evidence of mitral stenosis.  4. The aortic valve is normal in structure. Aortic valve regurgitation is  not visualized. No aortic stenosis is present.  5. The inferior vena cava is normal in size with greater than 50%  respiratory variability, suggesting right atrial pressure of 3 mmHg.   FINDINGS  Left Ventricle: Left ventricular ejection fraction, by estimation, is 65  to 70%. The left ventricle has normal  function. The left ventricle has no  regional wall motion abnormalities. The left ventricular internal cavity  size was normal in size. There is  mild left ventricular hypertrophy. Left ventricular diastolic parameters  are consistent with Grade I diastolic dysfunction (impaired relaxation).   Right Ventricle: The right ventricular size is normal. No increase in  right ventricular wall thickness. Right ventricular systolic function is  normal. There is mildly elevated pulmonary artery systolic pressure. The  tricuspid regurgitant velocity is 2.78  m/s, and with an assumed right atrial pressure of 8 mmHg, the estimated  right ventricular systolic pressure is [id number] mmHg.   Left Atrium: Left atrial size was normal in size.   Right Atrium: Right atrial size was normal in size.   Pericardium: There is no evidence of pericardial effusion.   Mitral Valve: The mitral valve is normal in structure. No evidence of  mitral valve  regurgitation. No evidence of mitral valve stenosis.   Tricuspid Valve: The tricuspid valve is normal in structure. Tricuspid  valve regurgitation is not demonstrated. No evidence of tricuspid  stenosis.   Aortic Valve: The aortic valve is normal in structure. Aortic valve  regurgitation is not visualized. No aortic stenosis is present.   Pulmonic Valve: The pulmonic valve was normal in structure. Pulmonic valve  regurgitation is not visualized. No evidence of pulmonic stenosis.   Aorta: The aortic root is normal in size and structure.   Venous: The inferior vena cava is normal in size with greater than 50%  respiratory variability, suggesting right atrial pressure of 3 mmHg.   IAS/Shunts: No atrial level shunt detected by color flow Doppler.   EKG:  EKG is *** ordered today.  The ekg ordered today demonstrates ***  Recent Labs: 08/30/2020: TSH 4.62 09/23/2020: ALT 21 10/13/2020: B Natriuretic Peptide 190.6; Hemoglobin 13.1; Platelets 146 10/20/2020: BUN 18; Creatinine, Ser 1.52; Potassium 4.0; Sodium 143  Recent Lipid Panel    Component Value Date/Time   CHOL 212 (H) 08/30/2020 1546   TRIG 136 08/30/2020 1546   HDL 49 (L) 08/30/2020 1546   CHOLHDL 4.3 08/30/2020 1546   VLDL 23.6 04/30/2018 1546   LDLCALC 137 (H) 08/30/2020 1546     Risk Assessment/Calculations:   {Does this patient have ATRIAL FIBRILLATION?[phone]}   Physical Exam:    VS:  There were no vitals taken for this visit.    Wt Readings from Last 3 Encounters:  10/20/20 280 lb 3.2 oz (127.1 kg)  10/13/20 280 lb 10.3 oz (127.3 kg)  10/07/20 280 lb 9.6 oz (127.3 kg)     GEN: *** Well nourished, well developed in no acute distress HEENT: Normal NECK: No JVD; No carotid bruits LYMPHATICS: No lymphadenopathy CARDIAC: ***RRR, no murmurs, rubs, gallops RESPIRATORY:  Clear to auscultation without rales, wheezing or rhonchi  ABDOMEN: Soft, non-tender, non-distended MUSCULOSKELETAL:  No edema; No  deformity  SKIN: Warm and dry NEUROLOGIC:  Alert and oriented x 3 PSYCHIATRIC:  Normal affect   ASSESSMENT:    No diagnosis found. PLAN:    In order of problems listed above:  #Acute on chronic diastolic heart failure: LVEF 65-70%. Continued significant LE edema, but slightly improved. Renal function stable on 12/15 to slightly improved. BNP 191. CXR without pulmonary edema. Diuresing well with torsemide.  -Continue torsemide 40mg  daily -Repeat BMET today; if stable Cr, will continue current diuretic plan. If elevated, will switch to alternating days of 20mg  daily and 40mg  daily of torsemide  -Continuecoreg to 6.25mg  BID -Continue losartan 100mg  daily -  Compression wraps to the lower extremities -Monitor daily weights and record them for [place] to review -Low Na diet  #Shortness of breath: Likely multifactorial in the setting of deconditioning, morbid obesity, chronic HFpEF, reactive airway disease. Overall symptoms improved since last visit.CXR with no evidence of pulmonary edema. -Diuresis as above -Continue inhalers and neb treatments -Follow-up with pulm as scheduled  #HTN: Improved today in 120s -Diuresis as above -Continuecoreg as above -Continue amlodipine 10mg  daily -Continue losartan 100mg  daily  #CKD: Stable to improving Cr with diuresis. -Agree with nephology referral -Diuresis as above -Repeat BMET today   #HLD: -Continue atorvasatin 40mg  daily   {Are you ordering a CV Procedure (e.g. stress test, cath, DCCV, TEE, etc)?   Press F2        :[id number]}    Medication Adjustments/Labs and Tests Ordered: Current medicines are reviewed at length with the patient today.  Concerns regarding medicines are outlined above.  No orders of the defined types were placed in this encounter.  No orders of the defined types were placed in this encounter.   There are no Patient Instructions on file for this visit.   Signed, Freada Bergeron, MD  11/18/2020 4:19  PM    [facility]

## 2020-11-23 ENCOUNTER — Ambulatory Visit: Payer: Medicare Other | Admitting: Cardiology

## 2020-11-25 DIAGNOSIS — M25559 Pain in unspecified hip: Secondary | ICD-10-CM | POA: Diagnosis not present

## 2020-11-25 DIAGNOSIS — R269 Unspecified abnormalities of gait and mobility: Secondary | ICD-10-CM | POA: Diagnosis not present

## 2020-11-25 DIAGNOSIS — I509 Heart failure, unspecified: Secondary | ICD-10-CM | POA: Diagnosis not present

## 2020-11-25 DIAGNOSIS — R2681 Unsteadiness on feet: Secondary | ICD-10-CM | POA: Diagnosis not present

## 2020-11-26 ENCOUNTER — Ambulatory Visit: Payer: Medicare Other | Admitting: Cardiology

## 2020-11-29 ENCOUNTER — Other Ambulatory Visit: Payer: Self-pay | Admitting: *Deleted

## 2020-11-29 NOTE — Patient Outreach (Signed)
[facility]) Care Management  11/29/2020  DEAVION STRIDER 1940-12-08 [id number]   Telephone screening  Disease Management   Referral received : 04/12/20 Referral source: PCP office Referral reason: Debility, CHF, daughter interested in SNF Placement.   PMHX: significant for Diastolic heart failure, Hypertension , Venous stasis of lower extremities , paranoid schizophrenia.    Subjective: Unsuccessful outreach call to patient daughter [name], designated party release, able to leave a voicemail message for return call.    Plan If no return call from patient/daughter  will plan outreach call in the next 3 months, for quarterly disease management outreach.     [name], RN, BSN  [place] Management Coordinator  [phone]- Mobile [phone]- Toll Free Main Office

## 2020-11-30 DIAGNOSIS — D649 Anemia, unspecified: Secondary | ICD-10-CM | POA: Diagnosis not present

## 2020-11-30 DIAGNOSIS — J45909 Unspecified asthma, uncomplicated: Secondary | ICD-10-CM | POA: Diagnosis not present

## 2020-11-30 DIAGNOSIS — I5033 Acute on chronic diastolic (congestive) heart failure: Secondary | ICD-10-CM | POA: Diagnosis not present

## 2020-11-30 DIAGNOSIS — N1832 Chronic kidney disease, stage 3b: Secondary | ICD-10-CM | POA: Diagnosis not present

## 2020-11-30 DIAGNOSIS — I129 Hypertensive chronic kidney disease with stage 1 through stage 4 chronic kidney disease, or unspecified chronic kidney disease: Secondary | ICD-10-CM | POA: Diagnosis not present

## 2020-12-01 ENCOUNTER — Encounter (HOSPITAL_COMMUNITY): Payer: Self-pay | Admitting: Psychiatry

## 2020-12-01 ENCOUNTER — Telehealth (INDEPENDENT_AMBULATORY_CARE_PROVIDER_SITE_OTHER): Payer: Medicare Other | Admitting: Psychiatry

## 2020-12-01 ENCOUNTER — Other Ambulatory Visit: Payer: Self-pay

## 2020-12-01 VITALS — Wt 280.0 lb

## 2020-12-01 DIAGNOSIS — F200 Paranoid schizophrenia: Secondary | ICD-10-CM | POA: Diagnosis not present

## 2020-12-01 DIAGNOSIS — F419 Anxiety disorder, unspecified: Secondary | ICD-10-CM

## 2020-12-01 DIAGNOSIS — F09 Unspecified mental disorder due to known physiological condition: Secondary | ICD-10-CM | POA: Diagnosis not present

## 2020-12-01 LAB — COMPREHENSIVE METABOLIC PANEL
Albumin: 4.2 (ref 3.5–5.0)
Calcium: 10.2 (ref 8.7–10.7)
GFR calc Af Amer: 50
GFR calc non Af Amer: 43

## 2020-12-01 LAB — BASIC METABOLIC PANEL
BUN: 16 (ref 4–21)
CO2: 24 — AB (ref 13–22)
Chloride: 107 (ref 99–108)
Creatinine: 1.2 — AB (ref 0.5–1.1)
Glucose: 120
Potassium: 4.8 (ref 3.4–5.3)

## 2020-12-01 MED ORDER — BUPROPION HCL ER (XL) 300 MG PO TB24: 300.0000 mg | ORAL_TABLET | Freq: Every morning | ORAL | 0 refills | Status: DC

## 2020-12-01 MED ORDER — HALOPERIDOL 0.5 MG PO TABS: ORAL_TABLET | ORAL | 2 refills | Status: DC

## 2020-12-01 NOTE — Progress Notes (Signed)
Virtual Visit via Telephone Note  I connected with [name] on 12/01/20 at  4:20 PM EST by telephone and verified that I am speaking with the correct person using two identifiers.  Location: Patient: Home Provider: Home Office   I discussed the limitations, risks, security and privacy concerns of performing an evaluation and management service by telephone and the availability of in person appointments. I also discussed with the patient that there may be a patient responsible charge related to this service. The patient expressed understanding and agreed to proceed.   History of Present Illness: Patient is evaluated by phone session.  Her daughter [name] is on the phone who provided most of the information.  In past few months patient's general condition of health has been deteriorating.  She had a fall and seen in the emergency room few times.  Her daughter reported last week she fluctuates in her behavior and having episodes of severe irritability as ripping her clothes to now appear more confused.  Her daughter has given few x3 times haloperidol to calm her down which helps and work and she was able to sleep.  Patient has chronic health issues and lately her memory has been declining.  Family is very concerned as patient requires some time more help.  Family has a difficulty to take her to the doctor's appointment and she struggled to get in and out of the car.  Now daughter is thinking to consider high level of care either nursing home or skilled nursing facility.  The beginning family is reluctant because of COVID but now they are open to start the process.  I spoke to patient on the phone briefly but she has rambling speech and at times incoherent.  She did not remember the details of recent hospitalization.  However she denies any suicidal thoughts or crying spells.  She also denies any hallucination but feels that she is getting weak.  Her appetite is fair.  Her energy level is low.  She  did not remember her current medication but reported that she is taking something to help her sleep, anxiety and depression.    Past Psychiatric History:Reviewed. H/Oinpatient at [facility] for 3 weeks after she jumped off from the window and husband saved. Seen in this office since 2013. Haldolhelped.  Recent Results (from the past 2160 hour(s))  Basic Metabolic Panel (BMET)     Status: Abnormal   Collection Time: 09/13/20  3:39 PM  Result Value Ref Range   Glucose, Bld 101 (H) 65 - 99 mg/dL    Comment: .            Fasting reference interval . For someone without known diabetes, a glucose value between 100 and 125 mg/dL is consistent with prediabetes and should be confirmed with a follow-up test. .    BUN 15 7 - 25 mg/dL   Creat 1.39 (H) 0.60 - 0.93 mg/dL    Comment: For patients >17 years of age, the reference limit for Creatinine is approximately 13% higher for people identified as African-American. .    BUN/Creatinine Ratio 11 6 - 22 (calc)   Sodium 142 135 - 146 mmol/L   Potassium 4.4 3.5 - 5.3 mmol/L   Chloride 106 98 - 110 mmol/L   CO2 22 20 - 32 mmol/L   Calcium 9.7 8.6 - 10.4 mg/dL  Renal Function Panel     Status: Abnormal   Collection Time: 09/22/20  3:28 PM  Result Value Ref Range   Glucose,  Bld 106 (H) 65 - 99 mg/dL    Comment: .            Fasting reference interval . For someone without known diabetes, a glucose value between 100 and 125 mg/dL is consistent with prediabetes and should be confirmed with a follow-up test. .    BUN 20 7 - 25 mg/dL   Creat 1.43 (H) 0.60 - 0.93 mg/dL    Comment: For patients >28 years of age, the reference limit for Creatinine is approximately 13% higher for people identified as African-American. .    BUN/Creatinine Ratio 14 6 - 22 (calc)   Sodium 142 135 - 146 mmol/L   Potassium 4.6 3.5 - 5.3 mmol/L   Chloride 107 98 - 110 mmol/L   CO2 21 20 - 32 mmol/L   Calcium 9.6 8.6 - 10.4 mg/dL   Phosphorus 4.5 (H) 2.1 - 4.3  mg/dL   Albumin 4.2 3.6 - 5.1 g/dL  CBC with Differential/Platelet     Status: Abnormal   Collection Time: 09/22/20  5:47 PM  Result Value Ref Range   WBC 10.1 4.0 - 10.5 K/uL   RBC 3.89 3.87 - 5.11 MIL/uL   Hemoglobin 11.8 (L) 12.0 - 15.0 g/dL   HCT 38.5 36.0 - 46.0 %   MCV 99.0 80.0 - 100.0 fL   MCH 30.3 26.0 - 34.0 pg   MCHC 30.6 30.0 - 36.0 g/dL   RDW 15.9 (H) 11.5 - 15.5 %   Platelets 262 150 - 400 K/uL   nRBC 0.0 0.0 - 0.2 %   Neutrophils Relative % 75 %   Neutro Abs 7.5 1.7 - 7.7 K/uL   Lymphocytes Relative 17 %   Lymphs Abs 1.7 0.7 - 4.0 K/uL   Monocytes Relative 5 %   Monocytes Absolute 0.6 0.1 - 1.0 K/uL   Eosinophils Relative 2 %   Eosinophils Absolute 0.2 0.0 - 0.5 K/uL   Basophils Relative 0 %   Basophils Absolute 0.0 0.0 - 0.1 K/uL   Immature Granulocytes 1 %   Abs Immature Granulocytes 0.07 0.00 - 0.07 K/uL    Comment: Performed at [facility], [address] [place], [place] [address]  Comprehensive metabolic panel     Status: Abnormal   Collection Time: 09/22/20  5:47 PM  Result Value Ref Range   Sodium 139 135 - 145 mmol/L   Potassium 5.1 3.5 - 5.1 mmol/L   Chloride 104 98 - 111 mmol/L   CO2 24 22 - 32 mmol/L   Glucose, Bld 126 (H) 70 - 99 mg/dL    Comment: Glucose reference range applies only to samples taken after fasting for at least 8 hours.   BUN 18 8 - 23 mg/dL   Creatinine, Ser 1.61 (H) 0.44 - 1.00 mg/dL   Calcium 9.4 8.9 - 10.3 mg/dL   Total Protein 7.1 6.5 - 8.1 g/dL   Albumin 3.6 3.5 - 5.0 g/dL   AST 20 15 - 41 U/L   ALT 21 0 - 44 U/L   Alkaline Phosphatase 95 38 - 126 U/L   Total Bilirubin 0.3 0.3 - 1.2 mg/dL   GFR, Estimated 32 (L) >60 mL/min    Comment: (NOTE) Calculated using the CKD-EPI Creatinine Equation (2021)    Anion gap 11 5 - 15    Comment: Performed at [facility] [address] [place], [place] [address]  Brain natriuretic peptide     Status: None   Collection Time: 09/22/20  5:47 PM  Result Value Ref  Range   B Natriuretic Peptide 35.2 0.0 - 100.0 pg/mL    Comment: Performed at [facility], [place] [address], [place], [place] [address]  Troponin I (High Sensitivity)     Status: None   Collection Time: 09/22/20  5:47 PM  Result Value Ref Range   Troponin I (High Sensitivity) 10 <18 ng/L    Comment: (NOTE) Elevated high sensitivity troponin I (hsTnI) values and significant  changes across serial measurements may suggest ACS but many other  chronic and acute conditions are known to elevate hsTnI results.  Refer to the "Links" section for chest pain algorithms and additional  guidance. Performed at [facility], [place] [address], [place], [place] [address]   Resp Panel by RT-PCR (Flu A&B, Covid) Nasopharyngeal Swab     Status: None   Collection Time: 09/22/20  5:53 PM   Specimen: Nasopharyngeal Swab; Nasopharyngeal(NP) swabs in vial transport medium  Result Value Ref Range   SARS Coronavirus 2 by RT PCR NEGATIVE NEGATIVE    Comment: (NOTE) SARS-CoV-2 target nucleic acids are NOT DETECTED.  The SARS-CoV-2 RNA is generally detectable in upper respiratory specimens during the acute phase of infection. The lowest concentration of SARS-CoV-2 viral copies this assay can detect is 138 copies/mL. A negative result does not preclude SARS-Cov-2 infection and should not be used as the sole basis for treatment or other patient management decisions. A negative result may occur with  improper specimen collection/handling, submission of specimen other than nasopharyngeal swab, presence of viral mutation(s) within the areas targeted by this assay, and inadequate number of viral copies(<138 copies/mL). A negative result must be combined with clinical observations, patient history, and epidemiological information. The expected result is Negative.  Fact Sheet for Patients:  EntrepreneurPulse.com.au  Fact Sheet for Healthcare Providers:   IncredibleEmployment.be  This test is no t yet approved or cleared by the [place] FDA and  has been authorized for detection and/or diagnosis of SARS-CoV-2 by FDA under an Emergency Use Authorization (EUA). This EUA will remain  in effect (meaning this test can be used) for the duration of the COVID-19 declaration under Section 564(b)(1) of the Act, 21 U.S.C.section 360bbb-3(b)(1), unless the authorization is terminated  or revoked sooner.       Influenza A by PCR NEGATIVE NEGATIVE   Influenza B by PCR NEGATIVE NEGATIVE    Comment: (NOTE) The Xpert Xpress SARS-CoV-2/FLU/RSV plus assay is intended as an aid in the diagnosis of influenza from Nasopharyngeal swab specimens and should not be used as a sole basis for treatment. Nasal washings and aspirates are unacceptable for Xpert Xpress SARS-CoV-2/FLU/RSV testing.  Fact Sheet for Patients: EntrepreneurPulse.com.au  Fact Sheet for Healthcare Providers: IncredibleEmployment.be  This test is not yet approved or cleared by the [place] FDA and has been authorized for detection and/or diagnosis of SARS-CoV-2 by FDA under an Emergency Use Authorization (EUA). This EUA will remain in effect (meaning this test can be used) for the duration of the COVID-19 declaration under Section 564(b)(1) of the Act, 21 U.S.C. section 360bbb-3(b)(1), unless the authorization is terminated or revoked.  Performed at [facility], [place] [address], [place], [place] [address]   Troponin I (High Sensitivity)     Status: None   Collection Time: 09/22/20  7:11 PM  Result Value Ref Range   Troponin I (High Sensitivity) 12 <18 ng/L    Comment: (NOTE) Elevated high sensitivity troponin I (hsTnI) values and  significant  changes across serial measurements may suggest ACS but many other  chronic and acute conditions are known to elevate hsTnI results.  Refer to the "Links" section for  chest pain algorithms and additional  guidance. Performed at [facility], [place] [address] [place], [place] [address]   MRSA PCR Screening     Status: None   Collection Time: 09/22/20 10:50 PM   Specimen: Nasopharyngeal  Result Value Ref Range   MRSA by PCR NEGATIVE NEGATIVE    Comment:        The GeneXpert MRSA Assay (FDA approved for NASAL specimens only), is one component of a comprehensive MRSA colonization surveillance program. It is not intended to diagnose MRSA infection nor to guide or monitor treatment for MRSA infections. Performed at [facility], [place] [address] [place], [place] [address]   Comprehensive metabolic panel     Status: Abnormal   Collection Time: 09/23/20 12:51 AM  Result Value Ref Range   Sodium 141 135 - 145 mmol/L   Potassium 4.3 3.5 - 5.1 mmol/L   Chloride 108 98 - 111 mmol/L   CO2 23 22 - 32 mmol/L   Glucose, Bld 208 (H) 70 - 99 mg/dL    Comment: Glucose reference range applies only to samples taken after fasting for at least 8 hours.   BUN 19 8 - 23 mg/dL   Creatinine, Ser 1.55 (H) 0.44 - 1.00 mg/dL   Calcium 9.2 8.9 - 10.3 mg/dL   Total Protein 6.7 6.5 - 8.1 g/dL   Albumin 3.3 (L) 3.5 - 5.0 g/dL   AST 21 15 - 41 U/L   ALT 21 0 - 44 U/L   Alkaline Phosphatase 84 38 - 126 U/L   Total Bilirubin 0.4 0.3 - 1.2 mg/dL   GFR, Estimated 34 (L) >60 mL/min    Comment: (NOTE) Calculated using the CKD-EPI Creatinine Equation (2021)    Anion gap 10 5 - 15    Comment: Performed at [facility] [address], [place], [place] [address]  CBC     Status: Abnormal   Collection Time: 09/23/20 12:51 AM  Result Value Ref Range   WBC 8.1 4.0 - 10.5 K/uL   RBC 3.53 (L) 3.87 - 5.11 MIL/uL   Hemoglobin 11.0 (L) 12.0 - 15.0 g/dL   HCT 33.1 (L) 36.0 - 46.0 %   MCV 93.8 80.0 - 100.0 fL   MCH 31.2 26.0 - 34.0 pg   MCHC 33.2 30.0 - 36.0 g/dL   RDW 15.8 (H) 11.5 - 15.5 %   Platelets 247 150 - 400 K/uL   nRBC 0.0 0.0 - 0.2 %    Comment:  Performed at [facility], [place] [address], [place],  [address]  Procalcitonin - Baseline     Status: None   Collection Time: 09/23/20  1:10 AM  Result Value Ref Range   Procalcitonin <0.10 ng/mL    Comment:        Interpretation: PCT (Procalcitonin) <= 0.5 ng/mL: Systemic infection (sepsis) is not likely. Local bacterial infection is possible. (NOTE)       Sepsis PCT Algorithm           Lower Respiratory Tract                                      Infection PCT Algorithm    ----------------------------     ----------------------------  PCT < 0.25 ng/mL                PCT < 0.10 ng/mL          Strongly encourage             Strongly discourage   discontinuation of antibiotics    initiation of antibiotics    ----------------------------     -----------------------------       PCT 0.25 - 0.50 ng/mL            PCT 0.10 - 0.25 ng/mL               OR       >80% decrease in PCT            Discourage initiation of                                            antibiotics      Encourage discontinuation           of antibiotics    ----------------------------     -----------------------------         PCT >= 0.50 ng/mL              PCT 0.26 - 0.50 ng/mL               AND        <80% decrease in PCT             Encourage initiation of                                             antibiotics       Encourage continuation           of antibiotics    ----------------------------     -----------------------------        PCT >= 0.50 ng/mL                  PCT > 0.50 ng/mL               AND         increase in PCT                  Strongly encourage                                      initiation of antibiotics    Strongly encourage escalation           of antibiotics                                     -----------------------------                                           PCT <= 0.25 ng/mL  OR                                         > 80% decrease in PCT                                      Discontinue / Do not initiate                                             antibiotics  Performed at [facility], [place] [address], [place], [place] [address]   HIV Antibody (routine testing w rflx)     Status: None   Collection Time: 09/23/20  7:03 AM  Result Value Ref Range   HIV Screen 4th Generation wRfx Non Reactive Non Reactive    Comment: Performed at [facility], [place] [address], [place], [place] [address]  Legionella Pneumophila Serogp 1 Ur Ag     Status: None   Collection Time: 09/23/20 10:20 AM  Result Value Ref Range   L. pneumophila Serogp 1 Ur Ag Negative Negative    Comment: (NOTE) Presumptive negative for L. pneumophila serogroup 1 antigen in urine, suggesting no recent or current infection. Legionnaires' disease cannot be ruled out since other serogroups and species may also cause disease. Performed At: [facility] [place], [place] [id number] Rush Farmer MD PX:[id number]    Source of Sample URINE, RANDOM     Comment: Performed at [facility], [place] [address], [place], [place] [address]  Strep pneumoniae urinary antigen     Status: None   Collection Time: 09/23/20 10:20 AM  Result Value Ref Range   Strep Pneumo Urinary Antigen NEGATIVE NEGATIVE    Comment:        Infection due to S. pneumoniae cannot be absolutely ruled out since the antigen present may be below the detection limit of the test. Performed at [facility], [address] [place],  [address]   ECHOCARDIOGRAM COMPLETE     Status: None   Collection Time: 09/23/20 10:34 AM  Result Value Ref Range   Weight 4,426.84 oz   Height 63.5 in   BP 166/63 mmHg   Single Plane A4C EF 55.0 %   S' Lateral 2.30 cm   Area-P 1/2 5.31 cm2  Procalcitonin     Status: None   Collection Time: 09/24/20 12:38 AM  Result Value Ref Range   Procalcitonin <0.10 ng/mL    Comment:         Interpretation: PCT (Procalcitonin) <= 0.5 ng/mL: Systemic infection (sepsis) is not likely. Local bacterial infection is possible. (NOTE)       Sepsis PCT Algorithm           Lower Respiratory Tract                                      Infection PCT Algorithm    ----------------------------     ----------------------------         PCT < 0.25 ng/mL  PCT < 0.10 ng/mL          Strongly encourage             Strongly discourage   discontinuation of antibiotics    initiation of antibiotics    ----------------------------     -----------------------------       PCT 0.25 - 0.50 ng/mL            PCT 0.10 - 0.25 ng/mL               OR       >80% decrease in PCT            Discourage initiation of                                            antibiotics      Encourage discontinuation           of antibiotics    ----------------------------     -----------------------------         PCT >= 0.50 ng/mL              PCT 0.26 - 0.50 ng/mL               AND        <80% decrease in PCT             Encourage initiation of                                             antibiotics       Encourage continuation           of antibiotics    ----------------------------     -----------------------------        PCT >= 0.50 ng/mL                  PCT > 0.50 ng/mL               AND         increase in PCT                  Strongly encourage                                      initiation of antibiotics    Strongly encourage escalation           of antibiotics                                     -----------------------------                                           PCT <= 0.25 ng/mL                                                 OR                                        >  80% decrease in PCT                                      Discontinue / Do not initiate                                             antibiotics  Performed at [facility], [place] [address] [place], [place] [address]    Basic metabolic panel     Status: Abnormal   Collection Time: 09/24/20 12:38 AM  Result Value Ref Range   Sodium 138 135 - 145 mmol/L   Potassium 4.8 3.5 - 5.1 mmol/L   Chloride 106 98 - 111 mmol/L   CO2 21 (L) 22 - 32 mmol/L   Glucose, Bld 187 (H) 70 - 99 mg/dL    Comment: Glucose reference range applies only to samples taken after fasting for at least 8 hours.   BUN 31 (H) 8 - 23 mg/dL   Creatinine, Ser 1.77 (H) 0.44 - 1.00 mg/dL   Calcium 8.9 8.9 - 10.3 mg/dL   GFR, Estimated 29 (L) >60 mL/min    Comment: (NOTE) Calculated using the CKD-EPI Creatinine Equation (2021)    Anion gap 11 5 - 15    Comment: Performed at [facility] [address] [place], [place] [address]  Basic metabolic panel     Status: Abnormal   Collection Time: 09/25/20  1:20 AM  Result Value Ref Range   Sodium 140 135 - 145 mmol/L   Potassium 4.5 3.5 - 5.1 mmol/L   Chloride 103 98 - 111 mmol/L   CO2 24 22 - 32 mmol/L   Glucose, Bld 145 (H) 70 - 99 mg/dL    Comment: Glucose reference range applies only to samples taken after fasting for at least 8 hours.   BUN 41 (H) 8 - 23 mg/dL   Creatinine, Ser 1.77 (H) 0.44 - 1.00 mg/dL   Calcium 8.8 (L) 8.9 - 10.3 mg/dL   GFR, Estimated 29 (L) >60 mL/min    Comment: (NOTE) Calculated using the CKD-EPI Creatinine Equation (2021)    Anion gap 13 5 - 15    Comment: Performed at [facility] [address], [place], [place] [address]  Basic Metabolic Panel (BMET)     Status: Abnormal   Collection Time: 10/07/20  3:57 PM  Result Value Ref Range   Glucose 144 (H) 65 - 99 mg/dL   BUN 29 (H) 8 - 27 mg/dL   Creatinine, Ser 1.66 (H) 0.57 - 1.00 mg/dL   GFR calc non Af Amer 29 (L) >59 mL/min/1.73   GFR calc Af Amer 34 (L) >59 mL/min/1.73    Comment: **In accordance with recommendations from the NKF-ASN Task force,**   [facility] is in the process of updating its eGFR calculation to the   2021 CKD-EPI creatinine equation that estimates kidney function    without a race variable.    BUN/Creatinine Ratio 17 12 - 28   Sodium 144 134 - 144 mmol/L   Potassium 4.9 3.5 - 5.2 mmol/L   Chloride 108 (H) 96 - 106 mmol/L   CO2 20 20 - 29 mmol/L   Calcium 9.4 8.7 - 10.3 mg/dL  Basic metabolic panel     Status: Abnormal  Collection Time: 10/13/20  7:32 PM  Result Value Ref Range   Sodium 143 135 - 145 mmol/L   Potassium 4.5 3.5 - 5.1 mmol/L   Chloride 108 98 - 111 mmol/L   CO2 24 22 - 32 mmol/L   Glucose, Bld 120 (H) 70 - 99 mg/dL    Comment: Glucose reference range applies only to samples taken after fasting for at least 8 hours.   BUN 20 8 - 23 mg/dL   Creatinine, Ser 1.58 (H) 0.44 - 1.00 mg/dL   Calcium 9.2 8.9 - 10.3 mg/dL   GFR, Estimated 33 (L) >60 mL/min    Comment: (NOTE) Calculated using the CKD-EPI Creatinine Equation (2021)    Anion gap 11 5 - 15    Comment: Performed at [facility] [address], [place], [place] [address]  CBC     Status: Abnormal   Collection Time: 10/13/20  7:32 PM  Result Value Ref Range   WBC 9.2 4.0 - 10.5 K/uL   RBC 4.12 3.87 - 5.11 MIL/uL   Hemoglobin 13.1 12.0 - 15.0 g/dL   HCT 40.0 36.0 - 46.0 %   MCV 97.1 80.0 - 100.0 fL   MCH 31.8 26.0 - 34.0 pg   MCHC 32.8 30.0 - 36.0 g/dL   RDW 17.3 (H) 11.5 - 15.5 %   Platelets 146 (L) 150 - 400 K/uL    Comment: REPEATED TO VERIFY   nRBC 0.0 0.0 - 0.2 %    Comment: Performed at [facility], [place] [address], [place], [place] [address]  Troponin I (High Sensitivity)     Status: None   Collection Time: 10/13/20  7:32 PM  Result Value Ref Range   Troponin I (High Sensitivity) 11 <18 ng/L    Comment: (NOTE) Elevated high sensitivity troponin I (hsTnI) values and significant  changes across serial measurements may suggest ACS but many other  chronic and acute conditions are known to elevate hsTnI results.  Refer to the "Links" section for chest pain algorithms and additional  guidance. Performed at [facility], [place] [address],  [place], [place] [address]   Brain natriuretic peptide     Status: Abnormal   Collection Time: 10/13/20  7:58 PM  Result Value Ref Range   B Natriuretic Peptide 190.6 (H) 0.0 - 100.0 pg/mL    Comment: Performed at [facility] [address], [place], [place] [address]  Troponin I (High Sensitivity)     Status: None   Collection Time: 10/13/20 10:33 PM  Result Value Ref Range   Troponin I (High Sensitivity) 11 <18 ng/L    Comment: (NOTE) Elevated high sensitivity troponin I (hsTnI) values and significant  changes across serial measurements may suggest ACS but many other  chronic and acute conditions are known to elevate hsTnI results.  Refer to the "Links" section for chest pain algorithms and additional  guidance. Performed at [facility], [place] [address], [place], [place] [address]   Basic metabolic panel     Status: Abnormal   Collection Time: 10/20/20 10:40 AM  Result Value Ref Range   Glucose 132 (H) 65 - 99 mg/dL   BUN 18 8 - 27 mg/dL   Creatinine, Ser 1.52 (H) 0.57 - 1.00 mg/dL   GFR calc non Af Amer 32 (L) >59 mL/min/1.73   GFR calc Af Amer 37 (L) >59 mL/min/1.73    Comment: **In accordance with recommendations from the NKF-ASN Task force,**  [facility] is in the process of updating its eGFR calculation to the   2021 CKD-EPI creatinine equation that estimates kidney function   without a race variable.    BUN/Creatinine Ratio 12 12 - 28   Sodium 143 134 - 144 mmol/L   Potassium 4.0 3.5 - 5.2 mmol/L   Chloride 109 (H) 96 - 106 mmol/L   CO2 18 (L) 20 - 29 mmol/L   Calcium 9.1 8.7 - 10.3 mg/dL      Psychiatric Specialty Exam: Physical Exam  Review of Systems  Weight 280 lb (127 kg).There is no height or weight on file to calculate BMI.  General Appearance: NA  Eye Contact:  NA  Speech:  Pressured and At times rambling  Volume:  Increased  Mood:  Dysphoric and Irritable  Affect:  NA  Thought Process:  Descriptions of Associations: Loose  Orientation:   Full (Time, Place, and Person)  Thought Content:  Paranoid Ideation  Suicidal Thoughts:  No  Homicidal Thoughts:  No  Memory:  Immediate;   Fair Recent;   Fair Remote;   Fair  Judgement:  Fair  Insight:  Fair  Psychomotor Activity:  NA  Concentration:  Concentration: Poor and Attention Span: Poor  Recall:  [employer] of Knowledge:  Fair  Language:  Fair  Akathisia:  No  Handed:  Right  AIMS (if indicated):     Assets:  Desire for Improvement Intimacy Social Support  ADL's:  Impaired  Cognition:  Impaired,  Mild  Sleep:   fair      Assessment and Plan: Schizophrenia chronic paranoid type.  Anxiety.  Cognitive impairment.  I reviewed blood work results.  Patient has been seen in the emergency room few times since the last visit.  Her chronic health issues are continued to deteriorate especially her memory.  Sometimes she rambles and does not comprehend well.  I did talk to the patient's daughter about high level of care and she is open to discuss with her PCP.  Daughter will call the PCP soon to start the process.  I recommend and severe agitation and anger she can try giving Haldol up to 3 times a day.  For now continue Wellbutrin XL 300 mg daily.  Patient and daughter do not recall any concerns or side effects from the medication.  Follow-up in 3 months.  Follow Up Instructions:    I discussed the assessment and treatment plan with the patient. The patient was provided an opportunity to ask questions and all were answered. The patient agreed with the plan and demonstrated an understanding of the instructions.   The patient was advised to call back or seek an in-person evaluation if the symptoms worsen or if the condition fails to improve as anticipated.  I provided 25 minutes of non-face-to-face time during this encounter.   Kathlee Nations, MD

## 2020-12-03 ENCOUNTER — Encounter: Payer: Self-pay | Admitting: Family Medicine

## 2020-12-08 DIAGNOSIS — J45909 Unspecified asthma, uncomplicated: Secondary | ICD-10-CM | POA: Diagnosis not present

## 2020-12-08 DIAGNOSIS — N1832 Chronic kidney disease, stage 3b: Secondary | ICD-10-CM | POA: Diagnosis not present

## 2020-12-08 DIAGNOSIS — I129 Hypertensive chronic kidney disease with stage 1 through stage 4 chronic kidney disease, or unspecified chronic kidney disease: Secondary | ICD-10-CM | POA: Diagnosis not present

## 2020-12-08 DIAGNOSIS — I5033 Acute on chronic diastolic (congestive) heart failure: Secondary | ICD-10-CM | POA: Diagnosis not present

## 2020-12-08 DIAGNOSIS — D649 Anemia, unspecified: Secondary | ICD-10-CM | POA: Diagnosis not present

## 2020-12-09 DIAGNOSIS — N390 Urinary tract infection, site not specified: Secondary | ICD-10-CM | POA: Diagnosis not present

## 2020-12-21 ENCOUNTER — Other Ambulatory Visit: Payer: Medicare Other

## 2020-12-23 ENCOUNTER — Other Ambulatory Visit: Payer: Medicare Other

## 2020-12-23 ENCOUNTER — Other Ambulatory Visit: Payer: Self-pay | Admitting: Cardiology

## 2020-12-23 ENCOUNTER — Other Ambulatory Visit: Payer: Self-pay

## 2020-12-23 DIAGNOSIS — I10 Essential (primary) hypertension: Secondary | ICD-10-CM | POA: Diagnosis not present

## 2020-12-23 DIAGNOSIS — I5033 Acute on chronic diastolic (congestive) heart failure: Secondary | ICD-10-CM | POA: Diagnosis not present

## 2020-12-24 ENCOUNTER — Telehealth: Payer: Self-pay | Admitting: Cardiology

## 2020-12-24 LAB — BASIC METABOLIC PANEL
BUN/Creatinine Ratio: 15 (ref 12–28)
BUN: 24 mg/dL (ref 8–27)
CO2: 19 mmol/L — ABNORMAL LOW (ref 20–29)
Calcium: 9.5 mg/dL (ref 8.7–10.3)
Chloride: 106 mmol/L (ref 96–106)
Creatinine, Ser: 1.65 mg/dL — ABNORMAL HIGH (ref 0.57–1.00)
GFR calc Af Amer: 34 mL/min/{1.73_m2} — ABNORMAL LOW (ref >59)
GFR calc non Af Amer: 29 mL/min/{1.73_m2} — ABNORMAL LOW (ref >59)
Glucose: 109 mg/dL — ABNORMAL HIGH (ref 65–99)
Potassium: 4.3 mmol/L (ref 3.5–5.2)
Sodium: 143 mmol/L (ref 134–144)

## 2020-12-24 NOTE — Telephone Encounter (Signed)
-----   Message from Freada Bergeron, MD sent at 12/24/2020  1:05 PM EST ----- Her Cr is a little elevated compared to prior. Can we switch her torsemide dosing to 20mg  every M,W, F and 40mg  every T, Thur, Sat, Sunday and repeat BMET in 14days.

## 2020-12-24 NOTE — Telephone Encounter (Signed)
Pt called in returning [facility] call for lab results   Best number [phone]

## 2020-12-24 NOTE — Telephone Encounter (Signed)
Spoke to patients daughter. Stated patient is not on torsemide anymore. Dr. Royce Macadamia the renal doctor took her off of that. She is currently taking lasix 80 mg daily. Last Cr. At Dr. Luis Abed office was 1.94 on 2/10.  Please further advise IF needed.

## 2020-12-26 DIAGNOSIS — I509 Heart failure, unspecified: Secondary | ICD-10-CM | POA: Diagnosis not present

## 2020-12-26 DIAGNOSIS — M25559 Pain in unspecified hip: Secondary | ICD-10-CM | POA: Diagnosis not present

## 2020-12-26 DIAGNOSIS — R269 Unspecified abnormalities of gait and mobility: Secondary | ICD-10-CM | POA: Diagnosis not present

## 2020-12-26 DIAGNOSIS — R2681 Unsteadiness on feet: Secondary | ICD-10-CM | POA: Diagnosis not present

## 2020-12-29 NOTE — Progress Notes (Unsigned)
Cardiology Office Note:    Date:  12/31/2020   ID:  [name], DOB 1941-05-13, MRN [id number]  PCP:  Vivi Barrack, MD   [place]  Cardiologist:  Freada Bergeron, MD  Advanced Practice Provider:  No care team member to display Electrophysiologist:  None   Referring MD: Vivi Barrack, MD     History of Present Illness:    [name] is a 80 y.o. female with a hx of  hx of chronicdiastolicHF, TIA, schizophrenia, dementia, CKD, and ovarian cancer s/p oophrectomy whois undergoing diuresis for significant volume overload in the setting of CKD and diastolic HF now presenting to clinic for follow-up.  Patient admitted from 11/24-11/27/21 for worsening SOB and hypoxia. She was diuresed with IV lasix and treated with antibiotics for COPD exacerbation. Her O2 status improved and she was discharged home.Daughter admits that she left the hospital prematurely as she was concerned about the lasix affecting her renal function and decided to take the patient home.  SawScott Weaver on 09/29/20. During that visit, the patient was continuing to have shortness of breath but was overall improved since admission. Weight was stable but with continued LE edema. She was started on lasix 40mg  daily and imdur 30mg  daily. She presents today for follow-up.  The patient went to the ED 10/13/20 with chest pain. Trop negative. BNP 190. CXR with some atelectasis in left lung base but no significant effusions or pulmonary edema. Cr 1.58. She was given IV lasix with good response and discharged home.  During our last visit on 10/20/20, the daughter was concerned that the patient was intermittently more confused and out of it which she attributed to her underlying dementia. Otherwise volume status was slowly improving. We had started on on torsemide which was subsequently stopped by Dr. Royce Macadamia due to rising Cr and she was transitioned to lasix 80mg  daily.  Today, the  patient is doing well. Continues to take lasix 80mg  daily and LE is improved. Cr improved to 1.6 from what is was at the renal phyisican's office. Coreg was lowered to 3.125mg  BID with blood pressures in low 100s. Otherwise, she has been taking all medications as prescribed without issues.  Past Medical History:  Diagnosis Date  . Chronic venous insufficiency   . Colon, diverticulosis Sept. 2011   outpatient colonoscopy by Dr. Cristina Gong.  Need record  . DDD (degenerative disc disease), lumbosacral   . Depression   . DJD (degenerative joint disease), multiple sites    Low back pain worst  . GERD (gastroesophageal reflux disease)   . Gout   . Gout    Uric Acid level 4.2 on 300 allopurinol  . History of cervical cancer 1972  . Hx-TIA (transient ischemic attack) 05/13/2001   Right facial numbness  . Hx-TIA (transient ischemic attack) 05/13/2001   Looking back in Epic, the patient had a hospitalization in July 2002 for evaluation of questionable TIA (s/s of right facial numbness associated with mild blurred vision in her right eye, diaphoresis, and mild confusion) with negative CT head and negative MRI/MRA and was started on Plavix after being heparinized. She was to have possible follow up with Neurology for reevaluation of the need for Pl  . Hyperlipidemia   . Hypertension   . Keloid 08/01/2018  . Memory disorder 12/20/2016  . Mitral valve prolapse 10/27/1999   Subsequent 2D echo show normal mitral valve  . Osteoarthritis of hip    bilateral hips  . Ovarian  cancer ([place]) 1972   S/P oophorectomy  . Psychosis ([place])   . Recurrent boils    History of MRSA skin infections with abscess  . Sensorineural hearing loss of both ears   . Subdural hematoma ([place]) 01/26/2017   bilateral  . Uncomplicated opioid dependence ([place]) 01/31/2018    Past Surgical History:  Procedure Laterality Date  . ABDOMINAL HYSTERECTOMY     1972  . BURR HOLE Bilateral 02/12/2017   Procedure: Haskell Flirt;  Surgeon: Earnie Larsson, MD;  Location: [place];  Service: Neurosurgery;  Laterality: Bilateral;  . CERVICAL DISCECTOMY  7/07   C5-C6  . JOINT REPLACEMENT     bilateral hip replacement  . LUMBAR DISC SURGERY     L5-S1 7/07  . LUMBAR LAMINECTOMY/DECOMPRESSION MICRODISCECTOMY N/A 02/02/2016   Procedure: L4-5 Decompression;  Surgeon: Marybelle Killings, MD;  Location: [place];  Service: Orthopedics;  Laterality: N/A;  . [place] for ovarian cancer    Current Medications: Current Meds  Medication Sig  . acetaminophen (TYLENOL) 500 MG tablet Take 500 mg by mouth at bedtime.   [name] albuterol (VENTOLIN HFA) 108 (90 Base) MCG/ACT inhaler Inhale 2 puffs into the lungs every 6 (six) hours as needed for wheezing or shortness of breath.  . allopurinol (ZYLOPRIM) 300 MG tablet TAKE 1 TABLET BY MOUTH  DAILY  . amLODipine (NORVASC) 5 MG tablet Take 1 tablet (5 mg total) by mouth daily.  [name] ascorbic acid (VITAMIN C) 500 MG tablet Take 500 mg by mouth daily.   [name] atorvastatin (LIPITOR) 40 MG tablet TAKE 1 TABLET BY MOUTH  DAILY  . budesonide (PULMICORT) 0.25 MG/2ML nebulizer solution Take 2 mLs (0.25 mg total) by nebulization 2 (two) times daily.  [name] buPROPion (WELLBUTRIN XL) 300 MG 24 hr tablet Take 1 tablet (300 mg total) by mouth every morning.  . carvedilol (COREG) 3.125 MG tablet Take 3.125 mg by mouth 2 (two) times daily with a meal.  . Cyanocobalamin (VITAMIN B12) 1000 MCG TBCR Take 1 tablet by mouth daily.  . diclofenac Sodium (VOLTAREN) 1 % GEL Apply 4 g topically 4 (four) times daily as needed (for pain).  . fluticasone (FLONASE) 50 MCG/ACT nasal spray Place into both nostrils as needed for allergies or rhinitis.  . furosemide (LASIX) 80 MG tablet Take 80 mg by mouth daily.  . haloperidol (HALDOL) 0.5 MG tablet Take one tab twice daily and 3rd if needed for agitation  . hydrOXYzine (VISTARIL) 25 MG capsule TAKE 1 CAPSULE BY MOUTH AT  BEDTIME  . ipratropium-albuterol (DUONEB) 0.5-2.5 (3) MG/3ML SOLN Take 3 mLs by  nebulization 2 (two) times daily.  [name] losartan (COZAAR) 100 MG tablet Take 1 tablet (100 mg total) by mouth daily.  . pantoprazole (PROTONIX) 40 MG tablet TAKE 1 TABLET BY MOUTH  TWICE DAILY BEFORE A MEAL  . traMADol (ULTRAM) 50 MG tablet TAKE 1 TABLET BY MOUTH  TWICE DAILY AS NEEDED  . [DISCONTINUED] amLODipine (NORVASC) 10 MG tablet TAKE 1 TABLET BY MOUTH  DAILY     Allergies:   Aspirin and Sulfamethoxazole-trimethoprim   Social History   Socioeconomic History  . Marital status: Widowed    Spouse name: Not on file  . Number of children: 3  . Years of education: 34  . Highest education level: Not on file  Occupational History  . Occupation: Retired  Tobacco Use  . Smoking status: Former Smoker    Packs/day: 0.10    Years: 35.00  Pack years: 3.50    Types: Cigarettes    Quit date: 04/08/2013    Years since quitting: 7.7  . Smokeless tobacco: Never Used  . Tobacco comment: smoked a few when her son died in 02/09/13   Vaping Use  . Vaping Use: Never used  Substance and Sexual Activity  . Alcohol use: No    Alcohol/week: 0.0 standard drinks  . Drug use: No  . Sexual activity: Never  Other Topics Concern  . Not on file  Social History Narrative   Lives with daughter in [place]. Daughter is primary caretaker, surrogate decision-maker, and arranges for [facility] aide and other caregivers to supervise Ms. Lague at home. Ambulatory   Social Determinants of Health   Financial Resource Strain: Not on file  Food Insecurity: Not on file  Transportation Needs: No Transportation Needs  . Lack of Transportation (Medical): No  . Lack of Transportation (Non-Medical): No  Physical Activity: Not on file  Stress: Not on file  Social Connections: Not on file     Family History: The patient's family history includes Diabetes in her maternal grandfather and mother; Hearing loss in her mother; Heart disease in her mother; Other in her brother; Post-traumatic stress disorder in her father;  Schizophrenia in her maternal grandmother; Stroke in her mother. There is no history of Kidney cancer or Bladder Cancer.  ROS:   Please see the history of present illness.    Review of Systems  Constitutional: Negative for chills and fever.  HENT: Negative for hearing loss.   Eyes: Negative for blurred vision and redness.  Respiratory: Positive for shortness of breath.   Cardiovascular: Positive for leg swelling. Negative for chest pain, palpitations, orthopnea, claudication and PND.  Gastrointestinal: Negative for nausea and vomiting.  Genitourinary: Negative for dysuria and flank pain.  Musculoskeletal: Positive for joint pain.  Neurological: Negative for dizziness and loss of consciousness.  Endo/Heme/Allergies: Negative for polydipsia.  Psychiatric/Behavioral: Negative for substance abuse.    EKGs/Labs/Other Studies Reviewed:    The following studies were reviewed today: TTE October 16, 2020: IMPRESSIONS  1. Left ventricular ejection fraction, by estimation, is 65 to 70%. The  left ventricle has normal function. The left ventricle has no regional  wall motion abnormalities. There is mild left ventricular hypertrophy.  Left ventricular diastolic parameters  are consistent with Grade I diastolic dysfunction (impaired relaxation).  2. Right ventricular systolic function is normal. The right ventricular  size is normal. There is mildly elevated pulmonary artery systolic  pressure.  3. The mitral valve is normal in structure. No evidence of mitral valve  regurgitation. No evidence of mitral stenosis.  4. The aortic valve is normal in structure. Aortic valve regurgitation is  not visualized. No aortic stenosis is present.  5. The inferior vena cava is normal in size with greater than 50%  respiratory variability, suggesting right atrial pressure of 3 mmHg.   FINDINGS  Left Ventricle: Left ventricular ejection fraction, by estimation, is 65  to 70%. The left ventricle has normal  function. The left ventricle has no  regional wall motion abnormalities. The left ventricular internal cavity  size was normal in size. There is  mild left ventricular hypertrophy. Left ventricular diastolic parameters  are consistent with Grade I diastolic dysfunction (impaired relaxation).   Right Ventricle: The right ventricular size is normal. No increase in  right ventricular wall thickness. Right ventricular systolic function is  normal. There is mildly elevated pulmonary artery systolic pressure. The  tricuspid regurgitant velocity is  2.78  m/s, and with an assumed right atrial pressure of 8 mmHg, the estimated  right ventricular systolic pressure is 58.0 mmHg.   Left Atrium: Left atrial size was normal in size.   Right Atrium: Right atrial size was normal in size.   Pericardium: There is no evidence of pericardial effusion.   Mitral Valve: The mitral valve is normal in structure. No evidence of  mitral valve regurgitation. No evidence of mitral valve stenosis.   Tricuspid Valve: The tricuspid valve is normal in structure. Tricuspid  valve regurgitation is not demonstrated. No evidence of tricuspid  stenosis.   Aortic Valve: The aortic valve is normal in structure. Aortic valve  regurgitation is not visualized. No aortic stenosis is present.   Pulmonic Valve: The pulmonic valve was normal in structure. Pulmonic valve  regurgitation is not visualized. No evidence of pulmonic stenosis.   Aorta: The aortic root is normal in size and structure.   Venous: The inferior vena cava is normal in size with greater than 50%  respiratory variability, suggesting right atrial pressure of 3 mmHg.   IAS/Shunts: No atrial level shunt detected by color flow Doppler.    EKG:  The ekg 10/14/20 demonstrates NSR with PVC  Recent Labs: 08/30/2020: TSH 4.62 09/23/2020: ALT 21 10/13/2020: B Natriuretic Peptide 190.6; Hemoglobin 13.1; Platelets 146 12/23/2020: BUN 24; Creatinine, Ser  1.65; Potassium 4.3; Sodium 143  Recent Lipid Panel    Component Value Date/Time   CHOL 212 (H) 08/30/2020 1546   TRIG 136 08/30/2020 1546   HDL 49 (L) 08/30/2020 1546   CHOLHDL 4.3 08/30/2020 1546   VLDL 23.6 04/30/2018 1546   LDLCALC 137 (H) 08/30/2020 1546     Physical Exam:    VS:  BP 100/62   Pulse 74   Ht 5' 3.5" (1.613 m)   SpO2 94%   BMI 48.82 kg/m     Wt Readings from Last 3 Encounters:  10/20/20 280 lb 3.2 oz (127.1 kg)  10/13/20 280 lb 10.3 oz (127.3 kg)  10/07/20 280 lb 9.6 oz (127.3 kg)     GEN:  Comfortable, wheelchair bound HEENT: Normal NECK: No JVD; No carotid bruits CARDIAC: RRR, no murmurs, rubs, gallops RESPIRATORY:  Clear to auscultation without rales, wheezing or rhonchi  ABDOMEN: Soft, non-tender, non-distended MUSCULOSKELETAL:  1+ pitting edema (much improved) SKIN: Warm and dry NEUROLOGIC:  Alert and oriented x 3 PSYCHIATRIC:  Normal affect   ASSESSMENT:    1. Acute on chronic diastolic (congestive) heart failure ([place])   2. Shortness of breath   3. Stage 3b chronic kidney disease ([place])   4. Essential hypertension   5. Hyperlipidemia, unspecified hyperlipidemia type    PLAN:    In order of problems listed above:  #Acute Chronic diastolic heart failure: LVEF 65-70%. LE edema significantly improved. Recently transitioned off torsemide to lasix per renal. Volume status much better. Renal function improving. -Continue lasix 80mg  daily with additional doses as needed -Check BMET today -Decrease amlodipine to 5mg  daily  -Continuecoreg to 3.125mg  BID -Continue losartan 100mg  daily -Compression wraps to the lower extremities -Monitor daily weights  -Low Na diet  #Shortness of breath: Likely multifactorial in the setting of deconditioning, morbid obesity, chronic HFpEF, reactive airway disease. Overall symptoms improved. -Diuresis as above -Continue inhalers and neb treatments -Follow-up with pulm as scheduled  #HTN: Well  controlled. -Diuresis as above -Continuecoreg as above -Decrease amlodipine to 5mg  daily -Continue losartan 100mg  daily  #CKD: -Follow-up with Nephrology -Diuresis as above -Repeat BMET  today   #HLD: -Continue atorvasatin 40mg  daily  Medication Adjustments/Labs and Tests Ordered: Current medicines are reviewed at length with the patient today.  Concerns regarding medicines are outlined above.  Orders Placed This Encounter  Procedures  . Basic metabolic panel   Meds ordered this encounter  Medications  . amLODipine (NORVASC) 5 MG tablet    Sig: Take 1 tablet (5 mg total) by mouth daily.    Dispense:  90 tablet    Refill:  3    Patient Instructions  Medication Instructions:  Your physician has recommended you make the following change in your medication:  1.  DECREASE the Amlodipine to 5 mg daily  *If you need a refill on your cardiac medications before your next appointment, please call your pharmacy*   Lab Work: TODAY:  BMET  If you have labs (blood work) drawn today and your tests are completely normal, you will receive your results only by: [name] MyChart Message (if you have MyChart) OR . A paper copy in the mail If you have any lab test that is abnormal or we need to change your treatment, we will call you to review the results.   Testing/Procedures: None ordered   Follow-Up: At [facility], you and your health needs are our priority.  As part of our continuing mission to provide you with exceptional heart care, we have created designated Provider Care Teams.  These Care Teams include your primary Cardiologist (physician) and Advanced Practice Providers (APPs -  Physician Assistants and Nurse Practitioners) who all work together to provide you with the care you need, when you need it.  We recommend signing up for the patient portal called "MyChart".  Sign up information is provided on this After Visit Summary.  MyChart is used to connect with patients for  Virtual Visits (Telemedicine).  Patients are able to view lab/test results, encounter notes, upcoming appointments, etc.  Non-urgent messages can be sent to your provider as well.   To learn more about what you can do with MyChart, go to [email or web address].    Your next appointment:   2 month(s)   03/10/2021 2:00   The format for your next appointment:   In Person  Provider:   Gwyndolyn Kaufman, MD   Other Instructions      Signed, Freada Bergeron, MD  12/31/2020 4:26 PM    [place]

## 2020-12-31 ENCOUNTER — Other Ambulatory Visit: Payer: Self-pay

## 2020-12-31 ENCOUNTER — Encounter: Payer: Self-pay | Admitting: Cardiology

## 2020-12-31 ENCOUNTER — Ambulatory Visit (INDEPENDENT_AMBULATORY_CARE_PROVIDER_SITE_OTHER): Payer: Medicare Other | Admitting: Cardiology

## 2020-12-31 VITALS — BP 100/62 | HR 74 | Ht 63.5 in

## 2020-12-31 DIAGNOSIS — I5033 Acute on chronic diastolic (congestive) heart failure: Secondary | ICD-10-CM | POA: Diagnosis not present

## 2020-12-31 DIAGNOSIS — I10 Essential (primary) hypertension: Secondary | ICD-10-CM

## 2020-12-31 DIAGNOSIS — N1832 Chronic kidney disease, stage 3b: Secondary | ICD-10-CM

## 2020-12-31 DIAGNOSIS — E785 Hyperlipidemia, unspecified: Secondary | ICD-10-CM | POA: Diagnosis not present

## 2020-12-31 DIAGNOSIS — R0602 Shortness of breath: Secondary | ICD-10-CM

## 2020-12-31 MED ORDER — AMLODIPINE BESYLATE 5 MG PO TABS: 5.0000 mg | ORAL_TABLET | Freq: Every day | ORAL | 3 refills | Status: DC

## 2020-12-31 NOTE — Patient Instructions (Addendum)
Medication Instructions:  Your physician has recommended you make the following change in your medication:  1.  DECREASE the Amlodipine to 5 mg daily  *If you need a refill on your cardiac medications before your next appointment, please call your pharmacy*   Lab Work: TODAY:  BMET  If you have labs (blood work) drawn today and your tests are completely normal, you will receive your results only by: [name] MyChart Message (if you have MyChart) OR . A paper copy in the mail If you have any lab test that is abnormal or we need to change your treatment, we will call you to review the results.   Testing/Procedures: None ordered   Follow-Up: At [facility], you and your health needs are our priority.  As part of our continuing mission to provide you with exceptional heart care, we have created designated Provider Care Teams.  These Care Teams include your primary Cardiologist (physician) and Advanced Practice Providers (APPs -  Physician Assistants and Nurse Practitioners) who all work together to provide you with the care you need, when you need it.  We recommend signing up for the patient portal called "MyChart".  Sign up information is provided on this After Visit Summary.  MyChart is used to connect with patients for Virtual Visits (Telemedicine).  Patients are able to view lab/test results, encounter notes, upcoming appointments, etc.  Non-urgent messages can be sent to your provider as well.   To learn more about what you can do with MyChart, go to [email or web address].    Your next appointment:   2 month(s)   03/10/2021 2:00   The format for your next appointment:   In Person  Provider:   Gwyndolyn Kaufman, MD   Other Instructions

## 2021-01-01 LAB — BASIC METABOLIC PANEL
BUN/Creatinine Ratio: 11 — ABNORMAL LOW (ref 12–28)
BUN: 17 mg/dL (ref 8–27)
CO2: 20 mmol/L (ref 20–29)
Calcium: 9.6 mg/dL (ref 8.7–10.3)
Chloride: 104 mmol/L (ref 96–106)
Creatinine, Ser: 1.59 mg/dL — ABNORMAL HIGH (ref 0.57–1.00)
Glucose: 135 mg/dL — ABNORMAL HIGH (ref 65–99)
Potassium: 3.9 mmol/L (ref 3.5–5.2)
Sodium: 143 mmol/L (ref 134–144)
eGFR: 33 mL/min/{1.73_m2} — ABNORMAL LOW (ref >59)

## 2021-01-03 ENCOUNTER — Telehealth: Payer: Self-pay | Admitting: Cardiology

## 2021-01-03 NOTE — Telephone Encounter (Signed)
Spoke with the pts daughter and informed her that per Dr. Johney Frame, okay for the pt to use cough drops or OTC cough suppressants.  Also informed the pts daughter this could be allergies related to weather changes, and its safe for her to try zyrtec or claritin.  Daughter verbalized understanding and agrees with this plan. Daughter was more than gracious for all the assistance provided.

## 2021-01-03 NOTE — Telephone Encounter (Signed)
Patient is returning call to discuss 12/31/20 lab results.

## 2021-01-03 NOTE — Telephone Encounter (Signed)
Okay to use cough drops or over the counter cough suppressants. May also be related to allergies with change of weather. Can try zyrtec over the counter as well.

## 2021-01-03 NOTE — Telephone Encounter (Signed)
RN called and spoke with patient's daughter and stated that per MD: Kidney function looks improved. Electrolytes look good. No changes in medications.    Patient's daughter stated that she was here Friday 12/31/2020 for an office visit with her mother and forgot to mention that the patient has been coughing since November, and it increased more on Wednesday. The daughter stated that the patient isn't SOB or bringing anything up when she coughs. RN asked what seemed to make her cough more, and she stated when the patient gets cold, and the daughter would give her a cough drop. Patient's daughter stated this helped with her cough, and that she has around 12 cough drops a day.  RN stated she would rely this message to Dr. Ledon Snare RN

## 2021-01-12 ENCOUNTER — Telehealth (INDEPENDENT_AMBULATORY_CARE_PROVIDER_SITE_OTHER): Payer: Medicare Other | Admitting: Psychiatry

## 2021-01-12 ENCOUNTER — Other Ambulatory Visit: Payer: Self-pay

## 2021-01-12 ENCOUNTER — Encounter (HOSPITAL_COMMUNITY): Payer: Self-pay | Admitting: Psychiatry

## 2021-01-12 VITALS — Wt 280.0 lb

## 2021-01-12 DIAGNOSIS — F09 Unspecified mental disorder due to known physiological condition: Secondary | ICD-10-CM | POA: Diagnosis not present

## 2021-01-12 DIAGNOSIS — F419 Anxiety disorder, unspecified: Secondary | ICD-10-CM

## 2021-01-12 DIAGNOSIS — F200 Paranoid schizophrenia: Secondary | ICD-10-CM | POA: Diagnosis not present

## 2021-01-12 MED ORDER — DONEPEZIL HCL 5 MG PO TABS
5.0000 mg | ORAL_TABLET | Freq: Every day | ORAL | 0 refills | Status: DC
Start: 2021-01-12 — End: 2021-02-08

## 2021-01-12 MED ORDER — OLANZAPINE 2.5 MG PO TABS: ORAL_TABLET | ORAL | 0 refills | Status: DC
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
## 2021-01-18 ENCOUNTER — Telehealth: Payer: Self-pay

## 2021-01-18 NOTE — Telephone Encounter (Signed)
Spoke with patient daughter,she scheduled appt for 01/20/2021

## 2021-01-18 NOTE — Telephone Encounter (Signed)
Pt.'s daughter is requesting a call in regards to a fungal infection under pt.'s stomach

## 2021-01-19 ENCOUNTER — Other Ambulatory Visit: Payer: Self-pay | Admitting: *Deleted

## 2021-01-19 NOTE — Patient Outreach (Signed)
[facility]) Care Management  01/19/2021  [name] [birth year] [id number]   Case Closure    Patient being transitioned to Chronic Care Management with the Primary Care Provider.     Plan Case Closure : Patient being transferred to a different case management program.    [name], RN, BSN  [place] Management Coordinator  [phone]- Mobile [phone]- [place]

## 2021-01-20 ENCOUNTER — Telehealth (INDEPENDENT_AMBULATORY_CARE_PROVIDER_SITE_OTHER): Payer: Medicare Other | Admitting: Family Medicine

## 2021-01-20 ENCOUNTER — Encounter: Payer: Self-pay | Admitting: Family Medicine

## 2021-01-20 VITALS — Ht 63.5 in | Wt 264.0 lb

## 2021-01-20 DIAGNOSIS — I5032 Chronic diastolic (congestive) heart failure: Secondary | ICD-10-CM

## 2021-01-20 DIAGNOSIS — R413 Other amnesia: Secondary | ICD-10-CM | POA: Diagnosis not present

## 2021-01-20 DIAGNOSIS — R5381 Other malaise: Secondary | ICD-10-CM

## 2021-01-20 DIAGNOSIS — R21 Rash and other nonspecific skin eruption: Secondary | ICD-10-CM | POA: Diagnosis not present

## 2021-01-20 DIAGNOSIS — I10 Essential (primary) hypertension: Secondary | ICD-10-CM | POA: Diagnosis not present

## 2021-01-20 MED ORDER — FLUCONAZOLE 150 MG PO TABS: 150.0000 mg | ORAL_TABLET | Freq: Once | ORAL | 0 refills | Status: AC

## 2021-01-20 MED ORDER — NYSTATIN 100000 UNIT/GM EX POWD: 1.0000 "application " | Freq: Three times a day (TID) | CUTANEOUS | 0 refills | Status: DC

## 2021-01-20 MED ORDER — KETOCONAZOLE 2 % EX CREA: 1.0000 "application " | TOPICAL_CREAM | Freq: Two times a day (BID) | CUTANEOUS | 0 refills | Status: DC

## 2021-01-20 NOTE — Assessment & Plan Note (Signed)
Will place referral to neurology.  Her psychiatrist had requested this been done a few months ago.  She was started on Aricept 5 mg daily but is not sure if it is made much of a difference.

## 2021-01-20 NOTE — Telephone Encounter (Signed)
See note

## 2021-01-20 NOTE — Assessment & Plan Note (Signed)
Doing well on Coreg 3.125 mg twice daily , amlodipine 5mg  daily, losartan 100 mg daily.

## 2021-01-20 NOTE — Assessment & Plan Note (Addendum)
Will place referral to social work/[facility].  She has several chronic medical conditions and would likely benefit from long-term placement at SNF or other facility at this point.  They were going to go ahead with stent placement last year however backed out due to concerns for Covid.  Her physical debility and medical needs have only increased over the last year and family feels like they are no longer able to manage adequately at home alone.

## 2021-01-20 NOTE — Assessment & Plan Note (Signed)
Managed by cardiology.  Seems to be euvolemic.

## 2021-01-20 NOTE — Progress Notes (Signed)
   [name] is a 80 y.o. female who presents today for a virtual office visit.  History provided by the patient's daughter.   Assessment/Plan:  New/Acute Problems: Rash Consistent with candidal intertrigo. Will refill nystatin.  Improving will consider ketoconazole plus Diflucan.  We will send in pocket prescription for these today.  They will let me know if no improvement.  Chronic Problems Addressed Today: Debility Will place referral to social work/[facility].  She has several chronic medical conditions and would likely benefit from long-term placement at SNF or other facility at this point.  They were going to go ahead with stent placement last year however backed out due to concerns for Covid.  Her physical debility and medical needs have only increased over the last year and family feels like they are no longer able to manage adequately at home alone.    Essential hypertension Doing well on Coreg 3.125 mg twice daily , amlodipine 5mg  daily, losartan 100 mg daily.  Memory disorder Will place referral to neurology.  Her psychiatrist had requested this been done a few months ago.  She was started on Aricept 5 mg daily but is not sure if it is made much of a difference.  Chronic diastolic heart failure ([place]) Managed by cardiology.  Seems to be euvolemic.     Subjective:  HPI:  See A/P for status of chronic conditions.  History provided by patient's daughter.  Has developed a rash under bilateral breast.  Very painful.  Thinks it may be in his infection.  Has tried using leftover nystatin which seems to help.  Needs a refill today.  Daughter is also interested in possible long-term placement for patient.  She has extensive needs due to history of CHF in addition to schizophrenia and dementia.  They have been trying to manage at home but feeling like it is at the point where it is no longer manageable to do so.  They have been in touch with [facility] and social work in the past regarding  placement.  They would like to have another referral placed again today.       Objective/Observations  Physical Exam: Gen: NAD, resting comfortably Pulm: Normal work of breathing Neuro: Grossly normal, moves all extremities Psych: Normal affect and thought content  Virtual Visit via Video   I connected with [name] on 01/20/21 at  3:40 PM EDT by a video enabled telemedicine application and verified that I am speaking with the correct person using two identifiers. The limitations of evaluation and management by telemedicine and the availability of in person appointments were discussed. The patient expressed understanding and agreed to proceed.   Patient location: Home Provider location: [place] participating in the virtual visit: Myself and Patient and patient's daughter     Algis Greenhouse. [name], MD 01/20/2021 4:12 PM

## 2021-01-21 ENCOUNTER — Telehealth: Payer: Self-pay | Admitting: *Deleted

## 2021-01-21 NOTE — Chronic Care Management (AMB) (Signed)
  Chronic Care Management   Note  01/21/2021 Name: [name] MRN: [id number] DOB: [birth year]  [name] is a 80 y.o. year old female who is a primary care patient of Vivi Barrack, MD. I reached out to [name] by phone today in response to a referral sent by Ms. Dayton Bailiff Szumski's PCP, Dr. [name].     Ms. Ellingsen was given information about Chronic Care Management services today including:  1. CCM service includes personalized support from designated clinical staff supervised by her physician, including individualized plan of care and coordination with other care providers 2. 24/7 contact phone numbers for assistance for urgent and routine care needs. 3. Service will only be billed when office clinical staff spend 20 minutes or more in a month to coordinate care. 4. Only one practitioner may furnish and bill the service in a calendar month. 5. The patient may stop CCM services at any time (effective at the end of the month) by phone call to the office staff. 6. The patient will be responsible for cost sharing (co-pay) of up to 20% of the service fee (after annual deductible is met).  Patient daughter [name]  verbally agreed to assistance and services provided by embedded care coordination/care management team today.  Follow up plan: Telephone appointment with care management team member scheduled for: Licensed Clinical SW 02/01/2021 [facility] 02/03/2021  [place] Management

## 2021-01-23 DIAGNOSIS — M25559 Pain in unspecified hip: Secondary | ICD-10-CM | POA: Diagnosis not present

## 2021-01-23 DIAGNOSIS — R2681 Unsteadiness on feet: Secondary | ICD-10-CM | POA: Diagnosis not present

## 2021-01-23 DIAGNOSIS — I509 Heart failure, unspecified: Secondary | ICD-10-CM | POA: Diagnosis not present

## 2021-01-23 DIAGNOSIS — R269 Unspecified abnormalities of gait and mobility: Secondary | ICD-10-CM | POA: Diagnosis not present

## 2021-01-24 ENCOUNTER — Telehealth: Payer: Self-pay | Admitting: *Deleted

## 2021-01-24 NOTE — Chronic Care Management (AMB) (Signed)
  Chronic Care Management   Note  01/24/2021 Name: [name] MRN: [id number] DOB: [birth year]  [name] is a 80 y.o. year old female who is a primary care patient of Vivi Barrack, MD. I reached out to [name] by phone today in response to a referral sent by Ms. Dayton Bailiff Pinales's PCP, Dr. [name].      [name] was given information about Chronic Care Management services today including:  1. CCM service includes personalized support from designated clinical staff supervised by her physician, including individualized plan of care and coordination with other care providers 2. 24/7 contact phone numbers for assistance for urgent and routine care needs. 3. Service will only be billed when office clinical staff spend 20 minutes or more in a month to coordinate care. 4. Only one practitioner may furnish and bill the service in a calendar month. 5. The patient may stop CCM services at any time (effective at the end of the month) by phone call to the office staff. 6. The patient will be responsible for cost sharing (co-pay) of up to 20% of the service fee (after annual deductible is met).  [name] patient daughter decided she did not want call with RNCM and Licensed Clinical SW.   Follow up plan: Patient declines further follow up and engagement by the care management team. Appropriate care team members and provider have been notified via electronic communication.   [place] Management

## 2021-01-25 ENCOUNTER — Telehealth (HOSPITAL_COMMUNITY): Payer: Self-pay

## 2021-01-25 DIAGNOSIS — N1832 Chronic kidney disease, stage 3b: Secondary | ICD-10-CM | POA: Diagnosis not present

## 2021-01-25 DIAGNOSIS — E876 Hypokalemia: Secondary | ICD-10-CM | POA: Diagnosis not present

## 2021-01-25 DIAGNOSIS — I5033 Acute on chronic diastolic (congestive) heart failure: Secondary | ICD-10-CM | POA: Diagnosis not present

## 2021-01-25 DIAGNOSIS — D649 Anemia, unspecified: Secondary | ICD-10-CM | POA: Diagnosis not present

## 2021-01-25 DIAGNOSIS — N179 Acute kidney failure, unspecified: Secondary | ICD-10-CM | POA: Diagnosis not present

## 2021-01-25 DIAGNOSIS — J45909 Unspecified asthma, uncomplicated: Secondary | ICD-10-CM | POA: Diagnosis not present

## 2021-01-25 DIAGNOSIS — I129 Hypertensive chronic kidney disease with stage 1 through stage 4 chronic kidney disease, or unspecified chronic kidney disease: Secondary | ICD-10-CM | POA: Diagnosis not present

## 2021-01-25 LAB — BASIC METABOLIC PANEL
BUN: 18 (ref 4–21)
CO2: 20 (ref 13–22)
Chloride: 105 (ref 99–108)
Creatinine: 1.8 — AB (ref 0.5–1.1)
Glucose: 110
Potassium: 4.5 (ref 3.4–5.3)
Sodium: 144 (ref 137–147)

## 2021-01-25 LAB — COMPREHENSIVE METABOLIC PANEL
Albumin: 4.5 (ref 3.5–5.0)
Calcium: 10 (ref 8.7–10.7)
GFR calc Af Amer: 29

## 2021-01-25 NOTE — Telephone Encounter (Signed)
Patient's daughter called regarding patient's Olanzapine 2.5mg . She stated that she wants [place] to do a PA on it because the medication is costing her $18 out of pocket. She's stating that the insurance won't cover any of it. We spoke with the pharmacist at [facility] who stated that a PA is not required and she picked up the medication for $18 on 3/16. She insisted that she wants it changed to 1 QD and wants a call from the provider. Please review and advise. Thank you

## 2021-01-26 NOTE — Telephone Encounter (Signed)
Spoke to daughter, future refills can be done 5 mg to take 1/2 to one tab daily .

## 2021-01-26 NOTE — Telephone Encounter (Signed)
Ok noted. Thank you.

## 2021-01-27 ENCOUNTER — Telehealth: Payer: Medicare Other

## 2021-01-31 ENCOUNTER — Telehealth: Payer: Self-pay

## 2021-01-31 NOTE — Telephone Encounter (Signed)
Pt is still wanting to see a Neurologist. It looks as if referral is closed. Pt's daughter believes that was a mistake and would still like that referral.

## 2021-02-01 ENCOUNTER — Other Ambulatory Visit: Payer: Self-pay | Admitting: Family Medicine

## 2021-02-01 ENCOUNTER — Telehealth: Payer: Medicare Other

## 2021-02-01 ENCOUNTER — Ambulatory Visit: Payer: Self-pay | Admitting: *Deleted

## 2021-02-01 DIAGNOSIS — M545 Low back pain, unspecified: Secondary | ICD-10-CM

## 2021-02-01 NOTE — Telephone Encounter (Signed)
Rx request 

## 2021-02-03 ENCOUNTER — Telehealth: Payer: Medicare Other

## 2021-02-03 ENCOUNTER — Other Ambulatory Visit (HOSPITAL_COMMUNITY): Payer: Self-pay | Admitting: Psychiatry

## 2021-02-03 DIAGNOSIS — F419 Anxiety disorder, unspecified: Secondary | ICD-10-CM

## 2021-02-03 DIAGNOSIS — F200 Paranoid schizophrenia: Secondary | ICD-10-CM

## 2021-02-03 NOTE — Telephone Encounter (Signed)
Daughter called back and asked if we can send this to [facility] sent and gave her the number to call and schedule if she does not hear from them in the next few days

## 2021-02-03 NOTE — Telephone Encounter (Signed)
Left daughter a VM explaining that her mom has declined this referral until she has seen Dr. [name] again

## 2021-02-08 ENCOUNTER — Other Ambulatory Visit: Payer: Self-pay

## 2021-02-08 ENCOUNTER — Telehealth (INDEPENDENT_AMBULATORY_CARE_PROVIDER_SITE_OTHER): Payer: Medicare Other | Admitting: Psychiatry

## 2021-02-08 ENCOUNTER — Encounter (HOSPITAL_COMMUNITY): Payer: Self-pay | Admitting: Psychiatry

## 2021-02-08 DIAGNOSIS — F09 Unspecified mental disorder due to known physiological condition: Secondary | ICD-10-CM

## 2021-02-08 DIAGNOSIS — F419 Anxiety disorder, unspecified: Secondary | ICD-10-CM | POA: Diagnosis not present

## 2021-02-08 DIAGNOSIS — F200 Paranoid schizophrenia: Secondary | ICD-10-CM

## 2021-02-08 MED ORDER — OLANZAPINE 2.5 MG PO TABS
ORAL_TABLET | ORAL | 1 refills | Status: DC
Start: 2021-02-08 — End: 2021-04-14

## 2021-02-08 MED ORDER — DONEPEZIL HCL 5 MG PO TABS: 5.0000 mg | ORAL_TABLET | Freq: Every day | ORAL | 0 refills | Status: DC
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
## 2021-02-10 ENCOUNTER — Encounter: Payer: Self-pay | Admitting: Neurology

## 2021-02-23 DIAGNOSIS — M25559 Pain in unspecified hip: Secondary | ICD-10-CM | POA: Diagnosis not present

## 2021-02-23 DIAGNOSIS — I509 Heart failure, unspecified: Secondary | ICD-10-CM | POA: Diagnosis not present

## 2021-02-23 DIAGNOSIS — R2681 Unsteadiness on feet: Secondary | ICD-10-CM | POA: Diagnosis not present

## 2021-02-23 DIAGNOSIS — R269 Unspecified abnormalities of gait and mobility: Secondary | ICD-10-CM | POA: Diagnosis not present

## 2021-02-24 ENCOUNTER — Encounter: Payer: Self-pay | Admitting: Family Medicine

## 2021-02-28 ENCOUNTER — Encounter: Payer: Self-pay | Admitting: Family Medicine

## 2021-02-28 ENCOUNTER — Ambulatory Visit (INDEPENDENT_AMBULATORY_CARE_PROVIDER_SITE_OTHER): Payer: Medicare Other | Admitting: Family Medicine

## 2021-02-28 ENCOUNTER — Other Ambulatory Visit: Payer: Self-pay

## 2021-02-28 VITALS — BP 151/67 | HR 80 | Temp 82.0°F | Ht 63.5 in | Wt 267.2 lb

## 2021-02-28 DIAGNOSIS — I10 Essential (primary) hypertension: Secondary | ICD-10-CM

## 2021-02-28 DIAGNOSIS — N1832 Chronic kidney disease, stage 3b: Secondary | ICD-10-CM

## 2021-02-28 DIAGNOSIS — R739 Hyperglycemia, unspecified: Secondary | ICD-10-CM

## 2021-02-28 DIAGNOSIS — I878 Other specified disorders of veins: Secondary | ICD-10-CM

## 2021-02-28 NOTE — Patient Instructions (Signed)
It was very nice to see you today!  We will check blood work today.  I will see back in 6 months.  Come back to see me sooner if needed.  Take care, Dr Aerie Donica  PLEASE NOTE:  If you had any lab tests please let us know if you have not heard back within a few days. You may see your results on mychart before we have a chance to review them but we will give you a call once they are reviewed by us. If we ordered any referrals today, please let us know if you have not heard from their office within the next week.   Please try these tips to maintain a healthy lifestyle:  Eat at least 3 REAL meals and 1-2 snacks per day.  Aim for no more than 5 hours between eating.  If you eat breakfast, please do so within one hour of getting up.   Each meal should contain half fruits/vegetables, one quarter protein, and one quarter carbs (no bigger than a computer mouse)  Cut down on sweet beverages. This includes juice, soda, and sweet tea.   Drink at least 1 glass of water with each meal and aim for at least 8 glasses per day  Exercise at least 150 minutes every week.   

## 2021-02-28 NOTE — Assessment & Plan Note (Signed)
Check A1c. 

## 2021-02-28 NOTE — Assessment & Plan Note (Signed)
We will check requested labs today.  She has been following with nephrology.

## 2021-02-28 NOTE — Progress Notes (Signed)
   CHRISIE JANKOVICH is a 80 y.o. female who presents today for an office visit.  History is provided by patient's daughter  Assessment/Plan:  Chronic Problems Addressed Today: Essential hypertension Slightly above goal.  We will continue current regimen of Coreg 3.125 mg twice daily, amlodipine 5 mg daily and losartan milligrams daily.  She will follow-up with cardiology soon  Chronic kidney disease (CKD), stage III (moderate) (HCC) We will check requested labs today.  She has been following with nephrology.  Hyperglycemia Check A1c.  Venous stasis of lower extremity He has had some weeping recently but overall lower extremity edema seems to be improving.  She is on Lasix per nephrology.  If continues to have significant weeping or other issues with hemostasis may consider referral for Unna boot placement.     Subjective:  HPI:  See A/P       Objective:  Physical Exam: BP (!) 151/67   Pulse 80   Temp (!) 82 F (27.8 C)   Ht 5' 3.5" (1.613 m)   Wt 267 lb 3.2 oz (121.2 kg)   SpO2 97%   BMI 46.59 kg/m   Gen: No acute distress, resting comfortably Neuro: Grossly normal, moves all extremities Psych: Normal affect and thought content      Avabella Wailes M. [name], MD 02/28/2021 3:16 PM

## 2021-02-28 NOTE — Assessment & Plan Note (Signed)
Slightly above goal.  We will continue current regimen of Coreg 3.125 mg twice daily, amlodipine 5 mg daily and losartan milligrams daily.  She will follow-up with cardiology soon

## 2021-02-28 NOTE — Assessment & Plan Note (Signed)
He has had some weeping recently but overall lower extremity edema seems to be improving.  She is on Lasix per nephrology.  If continues to have significant weeping or other issues with hemostasis may consider referral for Unna boot placement.

## 2021-03-01 LAB — CBC
HCT: 38.7 % (ref 36.0–46.0)
Hemoglobin: 12.8 g/dL (ref 12.0–15.0)
MCHC: 33 g/dL (ref 30.0–36.0)
MCV: 95 fl (ref 78.0–100.0)
Platelets: 184 10*3/uL (ref 150.0–400.0)
RBC: 4.08 Mil/uL (ref 3.87–5.11)
RDW: 16.2 % — ABNORMAL HIGH (ref 11.5–15.5)
WBC: 7.3 10*3/uL (ref 4.0–10.5)

## 2021-03-01 LAB — COMPREHENSIVE METABOLIC PANEL
ALT: 16 U/L (ref 0–35)
AST: 14 U/L (ref 0–37)
Albumin: 4.2 g/dL (ref 3.5–5.2)
Alkaline Phosphatase: 120 U/L — ABNORMAL HIGH (ref 39–117)
BUN: 29 mg/dL — ABNORMAL HIGH (ref 6–23)
CO2: 27 mEq/L (ref 19–32)
Calcium: 10 mg/dL (ref 8.4–10.5)
Chloride: 105 mEq/L (ref 96–112)
Creatinine, Ser: 1.58 mg/dL — ABNORMAL HIGH (ref 0.40–1.20)
GFR: 30.94 mL/min — ABNORMAL LOW (ref >60.00)
Glucose, Bld: 119 mg/dL — ABNORMAL HIGH (ref 70–99)
Potassium: 4.1 mEq/L (ref 3.5–5.1)
Sodium: 144 mEq/L (ref 135–145)
Total Bilirubin: 0.4 mg/dL (ref 0.2–1.2)
Total Protein: 7 g/dL (ref 6.0–8.3)

## 2021-03-01 LAB — HEMOGLOBIN A1C: Hgb A1c MFr Bld: 7 % — ABNORMAL HIGH (ref 4.6–6.5)

## 2021-03-01 LAB — TSH: TSH: 4.82 u[IU]/mL — ABNORMAL HIGH (ref 0.35–4.50)

## 2021-03-02 NOTE — Progress Notes (Signed)
Please inform patient of the following:  Her A1c went up a little bit but overall her labs are all stable. Do not need to make any adjustments to her medications at this time. We can recheck again in 6 months.  Algis Greenhouse. [name], MD 03/02/2021 8:10 AM

## 2021-03-07 ENCOUNTER — Telehealth: Payer: Self-pay | Admitting: Cardiology

## 2021-03-07 NOTE — Telephone Encounter (Signed)
[name]   [facility]  03/07/21 2:40 PM Note [place] please gave [name] daughter - [name] and call regarding her mom.  Had to cancel the up coming appointment due to transportation.   Need to discuss her seeing MD.   Please call [phone].

## 2021-03-07 NOTE — Telephone Encounter (Signed)
[name] please gave [name] daughter - [name] and call regarding her mom.  Had to cancel the up coming appointment due to transportation.   Need to discuss her seeing MD.   Please call [phone].

## 2021-03-08 ENCOUNTER — Telehealth: Payer: Self-pay | Admitting: Family Medicine

## 2021-03-08 NOTE — Chronic Care Management (AMB) (Signed)
  Chronic Care Management   Note  03/08/2021 Name: [name] MRN: [id number] DOB: 11/27/1940  [name] is a 80 y.o. year old female who is a primary care patient of Vivi Barrack, MD. I reached out to [name] by phone today in response to a referral sent by [name]'s PCP, Vivi Barrack, MD.   [name] was given information about Chronic Care Management services today including:  1. CCM service includes personalized support from designated clinical staff supervised by her physician, including individualized plan of care and coordination with other care providers 2. 24/7 contact phone numbers for assistance for urgent and routine care needs. 3. Service will only be billed when office clinical staff spend 20 minutes or more in a month to coordinate care. 4. Only one practitioner may furnish and bill the service in a calendar month. 5. The patient may stop CCM services at any time (effective at the end of the month) by phone call to the office staff.   Patient agreed to services and verbal consent obtained.   Follow up plan:   [name] Upstream Scheduler

## 2021-03-08 NOTE — Telephone Encounter (Signed)
Called the pts daughter and got her rescheduled to see Dr. Johney Frame for 03/25/21 at 0900.  She is aware to have the pt here 15 mins prior to that appt.  Daughter states she will arrange this with her transportation, and if that doesn't work out, she will bring the pt herself.  Daughter verbalized understanding and agrees with this plan.

## 2021-03-10 ENCOUNTER — Ambulatory Visit: Payer: Medicare Other | Admitting: Cardiology

## 2021-03-14 DIAGNOSIS — E876 Hypokalemia: Secondary | ICD-10-CM | POA: Diagnosis not present

## 2021-03-14 DIAGNOSIS — J45909 Unspecified asthma, uncomplicated: Secondary | ICD-10-CM | POA: Diagnosis not present

## 2021-03-14 DIAGNOSIS — E1122 Type 2 diabetes mellitus with diabetic chronic kidney disease: Secondary | ICD-10-CM | POA: Diagnosis not present

## 2021-03-14 DIAGNOSIS — D649 Anemia, unspecified: Secondary | ICD-10-CM | POA: Diagnosis not present

## 2021-03-14 DIAGNOSIS — I5033 Acute on chronic diastolic (congestive) heart failure: Secondary | ICD-10-CM | POA: Diagnosis not present

## 2021-03-14 DIAGNOSIS — N179 Acute kidney failure, unspecified: Secondary | ICD-10-CM | POA: Diagnosis not present

## 2021-03-14 DIAGNOSIS — I129 Hypertensive chronic kidney disease with stage 1 through stage 4 chronic kidney disease, or unspecified chronic kidney disease: Secondary | ICD-10-CM | POA: Diagnosis not present

## 2021-03-14 DIAGNOSIS — N1832 Chronic kidney disease, stage 3b: Secondary | ICD-10-CM | POA: Diagnosis not present

## 2021-03-24 NOTE — Progress Notes (Signed)
Cardiology Office Note:    Date:  03/25/2021   ID:  [name], DOB 11-17-1940, MRN [id number]  PCP:  Shelby Barrack, MD   [place]  Cardiologist:  Shelby Bergeron, MD  Advanced Practice Provider:  No care team member to display Electrophysiologist:  None   Referring MD: Shelby Barrack, MD     History of Present Illness:    [name] is a 80 y.o. female with a hx of  hx of chronicdiastolicHF, TIA, schizophrenia, dementia, CKD, and ovarian cancer s/p oophrectomy whois undergoing diuresis for significant volume overload in the setting of CKD and diastolic HF now presenting to clinic for follow-up.  Patient admitted from 11/24-11/27/21 for worsening SOB and hypoxia. She was diuresed with IV lasix and treated with antibiotics for COPD exacerbation. Her O2 status improved and she was discharged home.Daughter admits that she left the hospital prematurely as she was concerned about the lasix affecting her renal function and decided to take the patient home.  SawScott Drake on 09/29/20. During that visit, the patient was continuing to have shortness of breath but was overall improved since admission. Weight was stable but with continued LE edema. She was started on lasix 40mg  daily and imdur 30mg  daily.   The patient went to the ED 10/13/20 with chest pain. Trop negative. BNP 190. CXR with some atelectasis in left lung base but no significant effusions or pulmonary edema. Cr 1.58. She was given IV lasix with good response and discharged home.  During visit on12/22/21, the daughter was concerned that the patient was intermittently more confused and out of it which she attributed to her underlying dementia. Otherwise volume status was slowly improving. We had started on on torsemide which was subsequently stopped by Dr. Royce Drake due to rising Cr and she was transitioned to lasix 80mg  daily.  During last visit on 12/31/20, the patient was doing  well on lasix 80mg  daily. Cr improved to 1.6. Coreg was lowered to 3.125mg  BID and amlodipine was decreased to 5mg  daily due to soft Bps.  Today, the patient is doing very well. The LE edema is significantly improved. No SOB, orthopnea, PND, or chest pain. Cough is improved. Blood pressure is well controlled.  Cr 1.58. Taking all medications as prescribed.  Past Medical History:  Diagnosis Date  . Chronic venous insufficiency   . Colon, diverticulosis Sept. 2011   outpatient colonoscopy by Dr. Cristina Gong.  Need record  . DDD (degenerative disc disease), lumbosacral   . Depression   . DJD (degenerative joint disease), multiple sites    Low back pain worst  . GERD (gastroesophageal reflux disease)   . Gout   . Gout    Uric Acid level 4.2 on 300 allopurinol  . History of cervical cancer 1972  . Hx-TIA (transient ischemic attack) 05/13/2001   Right facial numbness  . Hx-TIA (transient ischemic attack) 05/13/2001   Looking back in Epic, the patient had a hospitalization in July 2002 for evaluation of questionable TIA (s/s of right facial numbness associated with mild blurred vision in her right eye, diaphoresis, and mild confusion) with negative CT head and negative MRI/MRA and was started on Plavix after being heparinized. She was to have possible follow up with Neurology for reevaluation of the need for Pl  . Hyperlipidemia   . Hypertension   . Keloid 08/01/2018  . Memory disorder 12/20/2016  . Mitral valve prolapse 10/27/1999   Subsequent 2D echo show normal mitral valve  .  Osteoarthritis of hip    bilateral hips  . Ovarian cancer ([place]) 1972   S/P oophorectomy  . Psychosis ([place])   . Recurrent boils    History of MRSA skin infections with abscess  . Sensorineural hearing loss of both ears   . Subdural hematoma ([place]) 01/26/2017   bilateral  . Uncomplicated opioid dependence ([place]) 01/31/2018    Past Surgical History:  Procedure Laterality Date  . ABDOMINAL HYSTERECTOMY     1972  . BURR  HOLE Bilateral 02/12/2017   Procedure: Haskell Flirt;  Surgeon: Earnie Larsson, MD;  Location: [facility];  Service: Neurosurgery;  Laterality: Bilateral;  . CERVICAL DISCECTOMY  7/07   C5-C6  . JOINT REPLACEMENT     bilateral hip replacement  . LUMBAR DISC SURGERY     L5-S1 7/07  . LUMBAR LAMINECTOMY/DECOMPRESSION MICRODISCECTOMY N/A 02/02/2016   Procedure: L4-5 Decompression;  Surgeon: Marybelle Killings, MD;  Location: [facility];  Service: Orthopedics;  Laterality: N/A;  . [place] for ovarian cancer    Current Medications: Current Meds  Medication Sig  . acetaminophen (TYLENOL) 500 MG tablet Take 500 mg by mouth at bedtime.   [name] albuterol (VENTOLIN HFA) 108 (90 Base) MCG/ACT inhaler Inhale 2 puffs into the lungs every 6 (six) hours as needed for wheezing or shortness of breath.  . allopurinol (ZYLOPRIM) 300 MG tablet TAKE 1 TABLET BY MOUTH  DAILY  . amLODipine (NORVASC) 5 MG tablet Take 1 tablet (5 mg total) by mouth daily.  [name] ascorbic acid (VITAMIN C) 500 MG tablet Take 500 mg by mouth daily.   [name] atorvastatin (LIPITOR) 40 MG tablet TAKE 1 TABLET BY MOUTH  DAILY  . budesonide (PULMICORT) 0.25 MG/2ML nebulizer solution Take 2 mLs (0.25 mg total) by nebulization 2 (two) times daily.  [name] buPROPion (WELLBUTRIN XL) 300 MG 24 hr tablet Take 1 tablet (300 mg total) by mouth every morning.  . Cyanocobalamin (VITAMIN B12) 1000 MCG TBCR Take 1 tablet by mouth daily.  . diclofenac Sodium (VOLTAREN) 1 % GEL Apply 4 g topically 4 (four) times daily as needed (for pain).  [name] donepezil (ARICEPT) 5 MG tablet Take 1 tablet (5 mg total) by mouth at bedtime.  . fluticasone (FLONASE) 50 MCG/ACT nasal spray Place into both nostrils as needed for allergies or rhinitis.  . furosemide (LASIX) 80 MG tablet Take 80 mg by mouth daily.  [name] ipratropium-albuterol (DUONEB) 0.5-2.5 (3) MG/3ML SOLN Take 3 mLs by nebulization 2 (two) times daily.  [name] ketoconazole (NIZORAL) 2 % cream Apply 1 application topically 2 (two) times  daily.  [name] losartan (COZAAR) 100 MG tablet Take 1 tablet (100 mg total) by mouth daily.  [name] nystatin (MYCOSTATIN/NYSTOP) powder Apply 1 application topically 3 (three) times daily.  [name] OLANZapine (ZYPREXA) 2.5 MG tablet Take 1 tab at bedtime  . pantoprazole (PROTONIX) 40 MG tablet TAKE 1 TABLET BY MOUTH  TWICE DAILY BEFORE A MEAL  . traMADol (ULTRAM) 50 MG tablet TAKE 1 TABLET BY MOUTH  TWICE DAILY AS NEEDED  . [DISCONTINUED] carvedilol (COREG) 3.125 MG tablet Take 3.125 mg by mouth 2 (two) times daily with a meal.     Allergies:   Aspirin and Sulfamethoxazole-trimethoprim   Social History   Socioeconomic History  . Marital status: Widowed    Spouse name: Not on file  . Number of children: 3  . Years of education: 27  . Highest education level: Not on file  Occupational History  . Occupation: Retired  Tobacco Use  . Smoking status: Former Smoker    Packs/day: 0.10    Years: 35.00    Pack years: 3.50    Types: Cigarettes    Quit date: 04/08/2013    Years since quitting: 7.9  . Smokeless tobacco: Never Used  . Tobacco comment: smoked a few when her son died in 2013-01-16   Vaping Use  . Vaping Use: Never used  Substance and Sexual Activity  . Alcohol use: No    Alcohol/week: 0.0 standard drinks  . Drug use: No  . Sexual activity: Never  Other Topics Concern  . Not on file  Social History Narrative   Lives with daughter in [place]. Daughter is primary caretaker, surrogate decision-maker, and arranges for [facility] aide and other caregivers to supervise Ms. Nordahl at home. Ambulatory   Social Determinants of Health   Financial Resource Strain: Not on file  Food Insecurity: Not on file  Transportation Needs: No Transportation Needs  . Lack of Transportation (Medical): No  . Lack of Transportation (Non-Medical): No  Physical Activity: Not on file  Stress: Not on file  Social Connections: Not on file     Family History: The patient's family history includes Diabetes in her  maternal grandfather and mother; Hearing loss in her mother; Heart disease in her mother; Other in her brother; Post-traumatic stress disorder in her father; Schizophrenia in her maternal grandmother; Stroke in her mother. There is no history of Kidney cancer or Bladder Cancer.  ROS:   Please see the history of present illness.    Review of Systems  Constitutional: Negative for chills and fever.  Eyes: Negative for blurred vision.  Respiratory: Negative for cough, shortness of breath and stridor.   Cardiovascular: Positive for leg swelling. Negative for chest pain, palpitations, orthopnea, claudication and PND.  Gastrointestinal: Negative for nausea and vomiting.  Genitourinary: Negative for hematuria.  Musculoskeletal: Positive for joint pain.  Neurological: Negative for dizziness and loss of consciousness.  Endo/Heme/Allergies: Negative for polydipsia.  Psychiatric/Behavioral: Positive for memory loss. Negative for substance abuse.    EKGs/Labs/Other Studies Reviewed:    The following studies were reviewed today: TTE September 24, 2020: IMPRESSIONS  1. Left ventricular ejection fraction, by estimation, is 65 to 70%. The  left ventricle has normal function. The left ventricle has no regional  wall motion abnormalities. There is mild left ventricular hypertrophy.  Left ventricular diastolic parameters  are consistent with Grade I diastolic dysfunction (impaired relaxation).  2. Right ventricular systolic function is normal. The right ventricular  size is normal. There is mildly elevated pulmonary artery systolic  pressure.  3. The mitral valve is normal in structure. No evidence of mitral valve  regurgitation. No evidence of mitral stenosis.  4. The aortic valve is normal in structure. Aortic valve regurgitation is  not visualized. No aortic stenosis is present.  5. The inferior vena cava is normal in size with greater than 50%  respiratory variability, suggesting right atrial pressure  of 3 mmHg.   FINDINGS  Left Ventricle: Left ventricular ejection fraction, by estimation, is 65  to 70%. The left ventricle has normal function. The left ventricle has no  regional wall motion abnormalities. The left ventricular internal cavity  size was normal in size. There is  mild left ventricular hypertrophy. Left ventricular diastolic parameters  are consistent with Grade I diastolic dysfunction (impaired relaxation).   Right Ventricle: The right ventricular size is normal. No increase in  right ventricular wall thickness. Right ventricular systolic function is  normal. There is mildly elevated pulmonary artery systolic pressure. The  tricuspid regurgitant velocity is 2.78  m/s, and with an assumed right atrial pressure of 8 mmHg, the estimated  right ventricular systolic pressure is 16.1 mmHg.   Left Atrium: Left atrial size was normal in size.   Right Atrium: Right atrial size was normal in size.   Pericardium: There is no evidence of pericardial effusion.   Mitral Valve: The mitral valve is normal in structure. No evidence of  mitral valve regurgitation. No evidence of mitral valve stenosis.   Tricuspid Valve: The tricuspid valve is normal in structure. Tricuspid  valve regurgitation is not demonstrated. No evidence of tricuspid  stenosis.   Aortic Valve: The aortic valve is normal in structure. Aortic valve  regurgitation is not visualized. No aortic stenosis is present.   Pulmonic Valve: The pulmonic valve was normal in structure. Pulmonic valve  regurgitation is not visualized. No evidence of pulmonic stenosis.   Aorta: The aortic root is normal in size and structure.   Venous: The inferior vena cava is normal in size with greater than 50%  respiratory variability, suggesting right atrial pressure of 3 mmHg.   IAS/Shunts: No atrial level shunt detected by color flow Doppler.    EKG:  No new tracing  Recent Labs: 10/13/2020: B Natriuretic Peptide  190.6 02/28/2021: ALT 16; BUN 29; Creatinine, Ser 1.58; Hemoglobin 12.8; Platelets 184.0; Potassium 4.1; Sodium 144; TSH 4.82  Recent Lipid Panel    Component Value Date/Time   CHOL 212 (H) 08/30/2020 1546   TRIG 136 08/30/2020 1546   HDL 49 (L) 08/30/2020 1546   CHOLHDL 4.3 08/30/2020 1546   VLDL 23.6 04/30/2018 1546   LDLCALC 137 (H) 08/30/2020 1546     Physical Exam:    VS:  BP 128/72   Pulse 74   Ht 5' 3.5" (1.613 m)   Wt 265 lb 6.4 oz (120.4 kg)   SpO2 94%   BMI 46.28 kg/m     Wt Readings from Last 3 Encounters:  03/25/21 265 lb 6.4 oz (120.4 kg)  02/28/21 267 lb 3.2 oz (121.2 kg)  01/20/21 264 lb (119.7 kg)     GEN:  Comfortable, more talkative today, wheelchair bound HEENT: Normal NECK: No JVD; No carotid bruits CARDIAC: RRR, no murmurs, rubs, gallops RESPIRATORY:  Clear to auscultation without rales, wheezing or rhonchi  ABDOMEN: obese, soft  non-tender, non-distended MUSCULOSKELETAL:  Trace-1+ pitting edema (much improved), warm SKIN: Warm and dry NEUROLOGIC:  Alert and oriented x 3 PSYCHIATRIC:  Normal affect   ASSESSMENT:    1. Chronic diastolic heart failure ([place])   2. Shortness of breath   3. Stage 3b chronic kidney disease ([place])   4. Essential hypertension   5. Leg edema    PLAN:    In order of problems listed above:  #Chronic diastolic heart failure: #LE edema LVEF 65-70%. LE edema significantly improved. Transitioned off torsemide to lasix per renal. Volume status much better. Renal function stable on recent labs with Cr 1.5. -Continue lasix 80mg  daily with additional doses as needed -Continue amlodipine to 5mg  daily  -Continuecoreg 3.125mg  BID -Continue losartan 100mg  daily -Compression wraps to the lower extremities with leg elevation -Monitor daily weights  -Low Na diet  #Shortness of breath: Likely multifactorial in the setting of deconditioning, morbid obesity, chronic HFpEF, reactive airway disease. Overall symptoms  improved. -Diuresis as above -Continue inhalers and neb treatments -Follow-up with pulm as scheduled  #HTN: Well controlled and at goal <  120s/80s. -Diuresis as above -Continuecoreg 3.125mg  BID -Continue amlodipine 5mg  daily -Continue losartan 100mg  daily  #CKD IIIB: Cr stable at 1.5.  -Follow-up with Nephrology as schedled -Diuresis as above  #HLD: -Continue atorvasatin 40mg  daily -Repeat lipids at next visit -No known CAD  Medication Adjustments/Labs and Tests Ordered: Current medicines are reviewed at length with the patient today.  Concerns regarding medicines are outlined above.  No orders of the defined types were placed in this encounter.  Meds ordered this encounter  Medications  . carvedilol (COREG) 3.125 MG tablet    Sig: Take 1 tablet (3.125 mg total) by mouth 2 (two) times daily with a meal.    Dispense:  180 tablet    Refill:  3    Patient Instructions  Medication Instructions:  Your physician recommends that you continue on your current medications as directed. Please refer to the Current Medication list given to you today.  *If you need a refill on your cardiac medications before your next appointment, please call your pharmacy*   Lab Work: None If you have labs (blood work) drawn today and your tests are completely normal, you will receive your results only by: [name] MyChart Message (if you have MyChart) OR . A paper copy in the mail If you have any lab test that is abnormal or we need to change your treatment, we will call you to review the results.   Testing/Procedures: None   Follow-Up: At [facility], you and your health needs are our priority.  As part of our continuing mission to provide you with exceptional heart care, we have created designated Provider Care Teams.  These Care Teams include your primary Cardiologist (physician) and Advanced Practice Providers (APPs -  Physician Assistants and Nurse Practitioners) who all work together  to provide you with the care you need, when you need it.  We recommend signing up for the patient portal called "MyChart".  Sign up information is provided on this After Visit Summary.  MyChart is used to connect with patients for Virtual Visits (Telemedicine).  Patients are able to view lab/test results, encounter notes, upcoming appointments, etc.  Non-urgent messages can be sent to your provider as well.   To learn more about what you can do with MyChart, go to [email or web address].    Your next appointment:   3 month(s)  The format for your next appointment:   In Person  Provider:   You may see Shelby Bergeron, MD or one of the following Advanced Practice Providers on your designated Care Team:    Richardson Dopp, PA-C  Robbie Lis, [place]    Other Instructions      Signed, Shelby Bergeron, MD  03/25/2021 9:34 AM    [place]

## 2021-03-25 ENCOUNTER — Encounter: Payer: Self-pay | Admitting: Cardiology

## 2021-03-25 ENCOUNTER — Other Ambulatory Visit: Payer: Self-pay

## 2021-03-25 ENCOUNTER — Ambulatory Visit (INDEPENDENT_AMBULATORY_CARE_PROVIDER_SITE_OTHER): Payer: Medicare Other | Admitting: Cardiology

## 2021-03-25 VITALS — BP 128/72 | HR 74 | Ht 63.5 in | Wt 265.4 lb

## 2021-03-25 DIAGNOSIS — R269 Unspecified abnormalities of gait and mobility: Secondary | ICD-10-CM | POA: Diagnosis not present

## 2021-03-25 DIAGNOSIS — N1832 Chronic kidney disease, stage 3b: Secondary | ICD-10-CM

## 2021-03-25 DIAGNOSIS — R2681 Unsteadiness on feet: Secondary | ICD-10-CM | POA: Diagnosis not present

## 2021-03-25 DIAGNOSIS — I5032 Chronic diastolic (congestive) heart failure: Secondary | ICD-10-CM | POA: Diagnosis not present

## 2021-03-25 DIAGNOSIS — R600 Localized edema: Secondary | ICD-10-CM | POA: Diagnosis not present

## 2021-03-25 DIAGNOSIS — R0602 Shortness of breath: Secondary | ICD-10-CM

## 2021-03-25 DIAGNOSIS — I509 Heart failure, unspecified: Secondary | ICD-10-CM | POA: Diagnosis not present

## 2021-03-25 DIAGNOSIS — I10 Essential (primary) hypertension: Secondary | ICD-10-CM

## 2021-03-25 DIAGNOSIS — M25559 Pain in unspecified hip: Secondary | ICD-10-CM | POA: Diagnosis not present

## 2021-03-25 MED ORDER — CARVEDILOL 3.125 MG PO TABS: 3.1250 mg | ORAL_TABLET | Freq: Two times a day (BID) | ORAL | 3 refills | Status: DC

## 2021-03-25 NOTE — Patient Instructions (Signed)
Medication Instructions:  Your physician recommends that you continue on your current medications as directed. Please refer to the Current Medication list given to you today.  *If you need a refill on your cardiac medications before your next appointment, please call your pharmacy*   Lab Work: None If you have labs (blood work) drawn today and your tests are completely normal, you will receive your results only by: [name] MyChart Message (if you have MyChart) OR . A paper copy in the mail If you have any lab test that is abnormal or we need to change your treatment, we will call you to review the results.   Testing/Procedures: None   Follow-Up: At [facility], you and your health needs are our priority.  As part of our continuing mission to provide you with exceptional heart care, we have created designated Provider Care Teams.  These Care Teams include your primary Cardiologist (physician) and Advanced Practice Providers (APPs -  Physician Assistants and Nurse Practitioners) who all work together to provide you with the care you need, when you need it.  We recommend signing up for the patient portal called "MyChart".  Sign up information is provided on this After Visit Summary.  MyChart is used to connect with patients for Virtual Visits (Telemedicine).  Patients are able to view lab/test results, encounter notes, upcoming appointments, etc.  Non-urgent messages can be sent to your provider as well.   To learn more about what you can do with MyChart, go to [email or web address].    Your next appointment:   3 month(s)  The format for your next appointment:   In Person  Provider:   You may see Freada Bergeron, MD or one of the following Advanced Practice Providers on your designated Care Team:    Richardson Dopp, PA-C  Robbie Lis, [place]    Other Instructions

## 2021-03-26 ENCOUNTER — Emergency Department: Payer: Medicare Other

## 2021-03-26 ENCOUNTER — Encounter: Payer: Self-pay | Admitting: Intensive Care

## 2021-03-26 ENCOUNTER — Observation Stay: Payer: Medicare Other

## 2021-03-26 ENCOUNTER — Other Ambulatory Visit: Payer: Self-pay

## 2021-03-26 ENCOUNTER — Inpatient Hospital Stay
Admission: EM | Admit: 2021-03-26 | Discharge: 2021-03-29 | DRG: 069 | Disposition: A | Discharge status: Home Health | Payer: Medicare Other | Attending: Obstetrics and Gynecology | Admitting: Obstetrics and Gynecology

## 2021-03-26 DIAGNOSIS — F32A Depression, unspecified: Secondary | ICD-10-CM | POA: Diagnosis present

## 2021-03-26 DIAGNOSIS — I130 Hypertensive heart and chronic kidney disease with heart failure and stage 1 through stage 4 chronic kidney disease, or unspecified chronic kidney disease: Secondary | ICD-10-CM | POA: Diagnosis not present

## 2021-03-26 DIAGNOSIS — I493 Ventricular premature depolarization: Secondary | ICD-10-CM | POA: Diagnosis not present

## 2021-03-26 DIAGNOSIS — Z90710 Acquired absence of both cervix and uterus: Secondary | ICD-10-CM

## 2021-03-26 DIAGNOSIS — Z8541 Personal history of malignant neoplasm of cervix uteri: Secondary | ICD-10-CM

## 2021-03-26 DIAGNOSIS — R55 Syncope and collapse: Secondary | ICD-10-CM

## 2021-03-26 DIAGNOSIS — T502X5A Adverse effect of carbonic-anhydrase inhibitors, benzothiadiazides and other diuretics, initial encounter: Secondary | ICD-10-CM | POA: Diagnosis not present

## 2021-03-26 DIAGNOSIS — J439 Emphysema, unspecified: Secondary | ICD-10-CM | POA: Diagnosis not present

## 2021-03-26 DIAGNOSIS — E876 Hypokalemia: Secondary | ICD-10-CM | POA: Diagnosis not present

## 2021-03-26 DIAGNOSIS — G40909 Epilepsy, unspecified, not intractable, without status epilepticus: Secondary | ICD-10-CM

## 2021-03-26 DIAGNOSIS — Z882 Allergy status to sulfonamides status: Secondary | ICD-10-CM

## 2021-03-26 DIAGNOSIS — I872 Venous insufficiency (chronic) (peripheral): Secondary | ICD-10-CM | POA: Diagnosis present

## 2021-03-26 DIAGNOSIS — Z8673 Personal history of transient ischemic attack (TIA), and cerebral infarction without residual deficits: Secondary | ICD-10-CM | POA: Diagnosis not present

## 2021-03-26 DIAGNOSIS — Z20822 Contact with and (suspected) exposure to covid-19: Secondary | ICD-10-CM | POA: Diagnosis not present

## 2021-03-26 DIAGNOSIS — Z818 Family history of other mental and behavioral disorders: Secondary | ICD-10-CM

## 2021-03-26 DIAGNOSIS — F0391 Unspecified dementia with behavioral disturbance: Secondary | ICD-10-CM | POA: Diagnosis not present

## 2021-03-26 DIAGNOSIS — R531 Weakness: Secondary | ICD-10-CM | POA: Diagnosis present

## 2021-03-26 DIAGNOSIS — I5033 Acute on chronic diastolic (congestive) heart failure: Secondary | ICD-10-CM | POA: Diagnosis not present

## 2021-03-26 DIAGNOSIS — E785 Hyperlipidemia, unspecified: Secondary | ICD-10-CM | POA: Diagnosis present

## 2021-03-26 DIAGNOSIS — I700 Atherosclerosis of aorta: Secondary | ICD-10-CM | POA: Diagnosis not present

## 2021-03-26 DIAGNOSIS — I2510 Atherosclerotic heart disease of native coronary artery without angina pectoris: Secondary | ICD-10-CM | POA: Diagnosis not present

## 2021-03-26 DIAGNOSIS — J810 Acute pulmonary edema: Secondary | ICD-10-CM

## 2021-03-26 DIAGNOSIS — K219 Gastro-esophageal reflux disease without esophagitis: Secondary | ICD-10-CM | POA: Diagnosis present

## 2021-03-26 DIAGNOSIS — J438 Other emphysema: Secondary | ICD-10-CM | POA: Diagnosis present

## 2021-03-26 DIAGNOSIS — R569 Unspecified convulsions: Secondary | ICD-10-CM | POA: Diagnosis not present

## 2021-03-26 DIAGNOSIS — G459 Transient cerebral ischemic attack, unspecified: Principal | ICD-10-CM | POA: Diagnosis present

## 2021-03-26 DIAGNOSIS — N1832 Chronic kidney disease, stage 3b: Secondary | ICD-10-CM | POA: Diagnosis present

## 2021-03-26 DIAGNOSIS — M109 Gout, unspecified: Secondary | ICD-10-CM | POA: Diagnosis present

## 2021-03-26 DIAGNOSIS — H903 Sensorineural hearing loss, bilateral: Secondary | ICD-10-CM | POA: Diagnosis not present

## 2021-03-26 DIAGNOSIS — Z7951 Long term (current) use of inhaled steroids: Secondary | ICD-10-CM

## 2021-03-26 DIAGNOSIS — I509 Heart failure, unspecified: Secondary | ICD-10-CM

## 2021-03-26 DIAGNOSIS — R918 Other nonspecific abnormal finding of lung field: Secondary | ICD-10-CM | POA: Diagnosis not present

## 2021-03-26 DIAGNOSIS — I110 Hypertensive heart disease with heart failure: Secondary | ICD-10-CM | POA: Diagnosis not present

## 2021-03-26 DIAGNOSIS — Z886 Allergy status to analgesic agent status: Secondary | ICD-10-CM

## 2021-03-26 DIAGNOSIS — R4182 Altered mental status, unspecified: Secondary | ICD-10-CM | POA: Diagnosis not present

## 2021-03-26 DIAGNOSIS — R4689 Other symptoms and signs involving appearance and behavior: Secondary | ICD-10-CM | POA: Diagnosis not present

## 2021-03-26 DIAGNOSIS — M160 Bilateral primary osteoarthritis of hip: Secondary | ICD-10-CM | POA: Diagnosis present

## 2021-03-26 DIAGNOSIS — Z6841 Body Mass Index (BMI) 40.0 and over, adult: Secondary | ICD-10-CM

## 2021-03-26 DIAGNOSIS — I739 Peripheral vascular disease, unspecified: Secondary | ICD-10-CM | POA: Diagnosis not present

## 2021-03-26 DIAGNOSIS — J811 Chronic pulmonary edema: Secondary | ICD-10-CM | POA: Diagnosis not present

## 2021-03-26 DIAGNOSIS — F0390 Unspecified dementia without behavioral disturbance: Secondary | ICD-10-CM | POA: Diagnosis present

## 2021-03-26 DIAGNOSIS — Z96643 Presence of artificial hip joint, bilateral: Secondary | ICD-10-CM | POA: Diagnosis present

## 2021-03-26 DIAGNOSIS — E66813 Obesity, class 3: Secondary | ICD-10-CM | POA: Diagnosis present

## 2021-03-26 DIAGNOSIS — F209 Schizophrenia, unspecified: Secondary | ICD-10-CM | POA: Diagnosis present

## 2021-03-26 DIAGNOSIS — Z8543 Personal history of malignant neoplasm of ovary: Secondary | ICD-10-CM

## 2021-03-26 DIAGNOSIS — Z8614 Personal history of Methicillin resistant Staphylococcus aureus infection: Secondary | ICD-10-CM

## 2021-03-26 DIAGNOSIS — Z823 Family history of stroke: Secondary | ICD-10-CM

## 2021-03-26 DIAGNOSIS — Z8249 Family history of ischemic heart disease and other diseases of the circulatory system: Secondary | ICD-10-CM

## 2021-03-26 DIAGNOSIS — Z87891 Personal history of nicotine dependence: Secondary | ICD-10-CM

## 2021-03-26 LAB — COMPREHENSIVE METABOLIC PANEL
ALT: 16 U/L (ref 0–44)
AST: 35 U/L (ref 15–41)
Albumin: 4 g/dL (ref 3.5–5.0)
Alkaline Phosphatase: 114 U/L (ref 38–126)
Anion gap: 13 (ref 5–15)
BUN: 24 mg/dL — ABNORMAL HIGH (ref 8–23)
CO2: 25 mmol/L (ref 22–32)
Calcium: 9.5 mg/dL (ref 8.9–10.3)
Chloride: 104 mmol/L (ref 98–111)
Creatinine, Ser: 1.99 mg/dL — ABNORMAL HIGH (ref 0.44–1.00)
GFR, Estimated: 25 mL/min — ABNORMAL LOW (ref >60)
Glucose, Bld: 146 mg/dL — ABNORMAL HIGH (ref 70–99)
Potassium: 4.6 mmol/L (ref 3.5–5.1)
Sodium: 142 mmol/L (ref 135–145)
Total Bilirubin: 1.3 mg/dL — ABNORMAL HIGH (ref 0.3–1.2)
Total Protein: 7.9 g/dL (ref 6.5–8.1)

## 2021-03-26 LAB — CBC
HCT: 39 % (ref 36.0–46.0)
Hemoglobin: 12.7 g/dL (ref 12.0–15.0)
MCH: 31 pg (ref 26.0–34.0)
MCHC: 32.6 g/dL (ref 30.0–36.0)
MCV: 95.1 fL (ref 80.0–100.0)
Platelets: 208 10*3/uL (ref 150–400)
RBC: 4.1 MIL/uL (ref 3.87–5.11)
RDW: 15.7 % — ABNORMAL HIGH (ref 11.5–15.5)
WBC: 8.4 10*3/uL (ref 4.0–10.5)
nRBC: 0 % (ref 0.0–0.2)

## 2021-03-26 LAB — URINALYSIS, COMPLETE (UACMP) WITH MICROSCOPIC
Bilirubin Urine: NEGATIVE
Glucose, UA: NEGATIVE mg/dL
Hgb urine dipstick: NEGATIVE
Ketones, ur: NEGATIVE mg/dL
Leukocytes,Ua: NEGATIVE
Nitrite: NEGATIVE
Protein, ur: 30 mg/dL — AB
Specific Gravity, Urine: 1.014 (ref 1.005–1.030)
pH: 5 (ref 5.0–8.0)

## 2021-03-26 LAB — CBG MONITORING, ED: Glucose-Capillary: 146 mg/dL — ABNORMAL HIGH (ref 70–99)

## 2021-03-26 MED ORDER — VITAMIN B-12 1000 MCG PO TABS
1000.0000 ug | ORAL_TABLET | Freq: Every day | ORAL | Status: DC
  Administered 2021-03-27 – 2021-03-29 (×3): 1000 ug via ORAL
  Filled 2021-03-26 (×4): qty 1

## 2021-03-26 MED ORDER — SODIUM CHLORIDE 0.9% FLUSH: 3.0000 mL | INTRAVENOUS | Status: DC | PRN

## 2021-03-26 MED ORDER — OLANZAPINE 2.5 MG PO TABS
2.5000 mg | ORAL_TABLET | Freq: Every day | ORAL | Status: DC
  Administered 2021-03-26 – 2021-03-28 (×3): 2.5 mg via ORAL
  Filled 2021-03-26 (×4): qty 1

## 2021-03-26 MED ORDER — FUROSEMIDE 10 MG/ML IJ SOLN
40.0000 mg | Freq: Once | INTRAMUSCULAR | Status: AC
  Administered 2021-03-26: 40 mg via INTRAVENOUS
  Filled 2021-03-26: qty 4

## 2021-03-26 MED ORDER — TRAMADOL HCL 50 MG PO TABS
50.0000 mg | ORAL_TABLET | Freq: Two times a day (BID) | ORAL | Status: DC
  Administered 2021-03-26: 50 mg via ORAL
  Filled 2021-03-26: qty 1

## 2021-03-26 MED ORDER — ALLOPURINOL 300 MG PO TABS
300.0000 mg | ORAL_TABLET | Freq: Every day | ORAL | Status: DC
  Filled 2021-03-26: qty 1

## 2021-03-26 MED ORDER — ENOXAPARIN SODIUM 30 MG/0.3ML IJ SOSY: 30.0000 mg | PREFILLED_SYRINGE | INTRAMUSCULAR | Status: DC

## 2021-03-26 MED ORDER — ALLOPURINOL 100 MG PO TABS
200.0000 mg | ORAL_TABLET | Freq: Every day | ORAL | Status: DC
  Administered 2021-03-27 – 2021-03-29 (×3): 200 mg via ORAL
  Filled 2021-03-26 (×4): qty 2

## 2021-03-26 MED ORDER — ASCORBIC ACID 500 MG PO TABS
500.0000 mg | ORAL_TABLET | Freq: Every day | ORAL | Status: DC
  Administered 2021-03-27 – 2021-03-29 (×3): 500 mg via ORAL
  Filled 2021-03-26 (×3): qty 1

## 2021-03-26 MED ORDER — STROKE: EARLY STAGES OF RECOVERY BOOK: Freq: Once | Status: DC

## 2021-03-26 MED ORDER — FUROSEMIDE 10 MG/ML IJ SOLN
80.0000 mg | Freq: Two times a day (BID) | INTRAMUSCULAR | Status: DC
  Administered 2021-03-26: 80 mg via INTRAVENOUS
  Filled 2021-03-26: qty 8

## 2021-03-26 MED ORDER — LOSARTAN POTASSIUM 50 MG PO TABS
100.0000 mg | ORAL_TABLET | Freq: Every day | ORAL | Status: DC
  Administered 2021-03-27 – 2021-03-29 (×3): 100 mg via ORAL
  Filled 2021-03-26 (×3): qty 2

## 2021-03-26 MED ORDER — BUDESONIDE 0.25 MG/2ML IN SUSP
0.2500 mg | Freq: Two times a day (BID) | RESPIRATORY_TRACT | Status: DC
  Administered 2021-03-26 (×2): 0.25 mg via RESPIRATORY_TRACT
  Filled 2021-03-26 (×3): qty 2

## 2021-03-26 MED ORDER — CARVEDILOL 3.125 MG PO TABS
3.1250 mg | ORAL_TABLET | Freq: Two times a day (BID) | ORAL | Status: DC
  Administered 2021-03-26 – 2021-03-29 (×6): 3.125 mg via ORAL
  Filled 2021-03-26 (×6): qty 1

## 2021-03-26 MED ORDER — ACETAMINOPHEN 500 MG PO TABS
500.0000 mg | ORAL_TABLET | Freq: Every day | ORAL | Status: DC
  Administered 2021-03-26 – 2021-03-28 (×3): 500 mg via ORAL
  Filled 2021-03-26 (×3): qty 1

## 2021-03-26 MED ORDER — AMLODIPINE BESYLATE 5 MG PO TABS: 5.0000 mg | ORAL_TABLET | Freq: Every day | ORAL | Status: DC

## 2021-03-26 MED ORDER — ENOXAPARIN SODIUM 40 MG/0.4ML IJ SOSY: 40.0000 mg | PREFILLED_SYRINGE | Freq: Two times a day (BID) | INTRAMUSCULAR | Status: DC

## 2021-03-26 MED ORDER — SODIUM CHLORIDE 0.9% FLUSH
3.0000 mL | Freq: Two times a day (BID) | INTRAVENOUS | Status: DC
  Administered 2021-03-26 – 2021-03-29 (×7): 3 mL via INTRAVENOUS

## 2021-03-26 MED ORDER — ENOXAPARIN SODIUM 40 MG/0.4ML IJ SOSY: 40.0000 mg | PREFILLED_SYRINGE | INTRAMUSCULAR | Status: DC

## 2021-03-26 MED ORDER — ONDANSETRON HCL 4 MG/2ML IJ SOLN: 4.0000 mg | Freq: Four times a day (QID) | INTRAMUSCULAR | Status: DC | PRN

## 2021-03-26 MED ORDER — IPRATROPIUM-ALBUTEROL 0.5-2.5 (3) MG/3ML IN SOLN
3.0000 mL | Freq: Two times a day (BID) | RESPIRATORY_TRACT | Status: DC
  Administered 2021-03-26 – 2021-03-28 (×4): 3 mL via RESPIRATORY_TRACT
  Filled 2021-03-26 (×5): qty 3

## 2021-03-26 MED ORDER — ONDANSETRON HCL 4 MG PO TABS: 4.0000 mg | ORAL_TABLET | Freq: Four times a day (QID) | ORAL | Status: DC | PRN

## 2021-03-26 MED ORDER — SODIUM CHLORIDE 0.9 % IV SOLN
250.0000 mL | INTRAVENOUS | Status: DC | PRN
Start: 2021-03-26 — End: 2021-03-29

## 2021-03-26 MED ORDER — BUPROPION HCL ER (XL) 150 MG PO TB24
300.0000 mg | ORAL_TABLET | Freq: Every morning | ORAL | Status: DC
  Administered 2021-03-27 – 2021-03-29 (×3): 300 mg via ORAL
  Filled 2021-03-26 (×3): qty 2

## 2021-03-26 MED ORDER — ATORVASTATIN CALCIUM 20 MG PO TABS
40.0000 mg | ORAL_TABLET | Freq: Every day | ORAL | Status: DC
  Administered 2021-03-27 – 2021-03-29 (×3): 40 mg via ORAL
  Filled 2021-03-26 (×3): qty 2

## 2021-03-26 MED ORDER — DONEPEZIL HCL 5 MG PO TABS
5.0000 mg | ORAL_TABLET | Freq: Every day | ORAL | Status: DC
  Administered 2021-03-26 – 2021-03-28 (×3): 5 mg via ORAL
  Filled 2021-03-26 (×4): qty 1

## 2021-03-26 MED ORDER — PANTOPRAZOLE SODIUM 40 MG PO TBEC
40.0000 mg | DELAYED_RELEASE_TABLET | Freq: Two times a day (BID) | ORAL | Status: DC
  Administered 2021-03-26 – 2021-03-29 (×6): 40 mg via ORAL
  Filled 2021-03-26 (×6): qty 1

## 2021-03-26 NOTE — ED Notes (Addendum)
Bladder scan shows 299cc. Corky Downs, MD notified.

## 2021-03-26 NOTE — Progress Notes (Signed)
PHARMACIST - PHYSICIAN COMMUNICATION  CONCERNING:  Enoxaparin (Lovenox) for DVT Prophylaxis    RECOMMENDATION: Patient was prescribed enoxaprin 40mg  q24 hours for VTE prophylaxis.   Filed Weights   03/26/21 0808  Weight: 120.2 kg (265 lb)    Body mass index is 46.94 kg/m.  Estimated Creatinine Clearance: 28.8 mL/min (A) (by C-G formula based on SCr of 1.99 mg/dL (H)).   Patient is candidate for enoxaparin 40mg  every 24 hours based on CrCl <70ml/min with BMI>30  DESCRIPTION: Pharmacy has adjusted enoxaparin dose per [facility] policy.  Patient is now receiving enoxaparin 40 mg every 24 hours    Berta Minor, PharmD Clinical Pharmacist  03/26/2021 12:21 PM

## 2021-03-26 NOTE — ED Notes (Addendum)
Patient transported to MRI. Will give medications that are due upon arrival back to ED.

## 2021-03-26 NOTE — ED Triage Notes (Signed)
Patient arrived from home where she lives with son. Son reports this AM when she was going to the bathroom she had "shaking", left arm limp, tongue hanging out, and trouble speaking. Daughter at bedside reports her face looked lopsided yesterday. Reports patient more tired than usual.

## 2021-03-26 NOTE — H&P (Signed)
History and Physical    [name] DOB: 07/20/1941 DOA: 03/26/2021  PCP: Vivi Barrack, MD   Patient coming from: Home  I have personally briefly reviewed patient's old medical records in [place]  Chief Complaint: Change in mental status History was obtained from patient's daughter [name] who was at the bedside  HPI: [name] is a 80 y.o. female with medical history significant for chronic diastolic CHF last 2D echo done in 20s1 showed EF of 65 to 70% with grade 1 diastolic dysfunction, chronic kidney disease stage III - IV, hypertension, gout, COPD who was brought into the ER by family for evaluation. Patient has dementia and is unable to provide any history.  Her daughter states that she and her brother alternate taking care of their mother and that her brother was taking care of the mother overnight.  They were concerned that patient had not voided all through the night because she is usually incontinent of urine and is on furosemide 80 mg daily.  Her brother walked the patient to the bathroom and while she was sitting on the commode he states that she suddenly had an episode that he describes as generalized shaking spell, transient unresponsiveness, tongue hanging out of her mouth and leaning towards her left side. According to the daughter this lasted about 15 seconds and then patient became more awake but did not respond to any verbal stimuli.  Her son tried to get her up but noticed that her left side was weak.  This improved after a few minutes and patient was able to ambulate back to her room using her Rollator. They decided to bring the patient to the ER to get evaluated for possible stroke. I am unable to do review of systems on this patient due to her underlying dementia. Show sodium 142, potassium 4.6, chloride 104, bicarb 25, glucose 1.6, BUN 24, creatinine 1.9, calcium 9.5, alkaline phosphatase 114, albumin 4.0, AST 35, ALT 16, total  protein 7.9, total bilirubin 1.3, white count 8.4, hemoglobin 12.7, hematocrit 39, MCV 95.9, RDW 15.7, platelet 208. Chest x-ray reviewed by me shows widening of the mediastinum, likely secondary to technical factors (rotation of the patient to the right), however ascending aortic aneurysmal changes could appear similarly. Consider chest CT for further evaluation in the appropriate clinical setting. Mild pulmonary edema. CT scan of the head without contrast shows  No acute finding. Chronic small vessel disease with chronic right thalamic lacunar infarct which has occurred since a 2021 comparison. Twelve-lead EKG reviewed by me shows sinus rhythm with PVCs.     ED Course: Patient is a 80 year old African-American female with a history of dementia who was brought into the ER by family for evaluation of an episode of transient unresponsiveness associated with left-sided weakness and generalized shaking spells, which have resolved. Imaging shows mild pulmonary edema Patient will be referred to observation status for TIA evaluation.  Review of Systems: As per HPI otherwise all other systems reviewed and negative.    Past Medical History:  Diagnosis Date  . Chronic venous insufficiency   . Colon, diverticulosis Sept. 2011   outpatient colonoscopy by Dr. Cristina Gong.  Need record  . DDD (degenerative disc disease), lumbosacral   . Depression   . DJD (degenerative joint disease), multiple sites    Low back pain worst  . GERD (gastroesophageal reflux disease)   . Gout   . Gout    Uric Acid level 4.2 on 300 allopurinol  .  History of cervical cancer 1972  . Hx-TIA (transient ischemic attack) 05/13/2001   Right facial numbness  . Hx-TIA (transient ischemic attack) 05/13/2001   Looking back in Epic, the patient had a hospitalization in July 2002 for evaluation of questionable TIA (s/s of right facial numbness associated with mild blurred vision in her right eye, diaphoresis, and mild confusion)  with negative CT head and negative MRI/MRA and was started on Plavix after being heparinized. She was to have possible follow up with Neurology for reevaluation of the need for Pl  . Hyperlipidemia   . Hypertension   . Keloid 08/01/2018  . Memory disorder 12/20/2016  . Mitral valve prolapse 10/27/1999   Subsequent 2D echo show normal mitral valve  . Osteoarthritis of hip    bilateral hips  . Ovarian cancer ([place]) 1972   S/P oophorectomy  . Psychosis ([place])   . Recurrent boils    History of MRSA skin infections with abscess  . Sensorineural hearing loss of both ears   . Subdural hematoma ([place]) 01/26/2017   bilateral  . Uncomplicated opioid dependence ([place]) 01/31/2018    Past Surgical History:  Procedure Laterality Date  . ABDOMINAL HYSTERECTOMY     1972  . BURR HOLE Bilateral 02/12/2017   Procedure: Haskell Flirt;  Surgeon: Earnie Larsson, MD;  Location: [facility];  Service: Neurosurgery;  Laterality: Bilateral;  . CERVICAL DISCECTOMY  7/07   C5-C6  . JOINT REPLACEMENT     bilateral hip replacement  . LUMBAR DISC SURGERY     L5-S1 7/07  . LUMBAR LAMINECTOMY/DECOMPRESSION MICRODISCECTOMY N/A 02/02/2016   Procedure: L4-5 Decompression;  Surgeon: Marybelle Killings, MD;  Location: [facility];  Service: Orthopedics;  Laterality: N/A;  . [place] for ovarian cancer     reports that she quit smoking about 7 years ago. Her smoking use included cigarettes. She has a 3.50 pack-year smoking history. She has never used smokeless tobacco. She reports that she does not drink alcohol and does not use drugs.  Allergies  Allergen Reactions  . Aspirin Other (See Comments)    Bleeding from vagina   . Sulfamethoxazole-Trimethoprim Itching    Family History  Problem Relation Age of Onset  . Diabetes Mother   . Heart disease Mother   . Hearing loss Mother   . Stroke Mother   . Post-traumatic stress disorder Father   . Schizophrenia Maternal Grandmother   . Diabetes Maternal Grandfather   . Other  Brother        brother died in mental health hospital  . Kidney cancer Neg Hx   . Bladder Cancer Neg Hx       Prior to Admission medications   Medication Sig Start Date End Date Taking? Authorizing Provider  acetaminophen (TYLENOL) 500 MG tablet Take 500 mg by mouth at bedtime.     [provider]  albuterol (VENTOLIN HFA) 108 (90 Base) MCG/ACT inhaler Inhale 2 puffs into the lungs every 6 (six) hours as needed for wheezing or shortness of breath. 09/30/20   Vivi Barrack, MD  allopurinol (ZYLOPRIM) 300 MG tablet TAKE 1 TABLET BY MOUTH  DAILY Patient taking differently: Take 300 mg by mouth daily. 07/29/20   Vivi Barrack, MD  amLODipine (NORVASC) 5 MG tablet Take 1 tablet (5 mg total) by mouth daily. 12/31/20 03/31/21  Freada Bergeron, MD  ascorbic acid (VITAMIN C) 500 MG tablet Take 500 mg by mouth daily.     [provider]  atorvastatin (LIPITOR) 40 MG tablet TAKE 1 TABLET BY MOUTH  DAILY Patient taking differently: Take 40 mg by mouth daily. 07/29/20   Vivi Barrack, MD  budesonide (PULMICORT) 0.25 MG/2ML nebulizer solution Take 2 mLs (0.25 mg total) by nebulization 2 (two) times daily. 10/07/20   Freada Bergeron, MD  buPROPion (WELLBUTRIN XL) 300 MG 24 hr tablet Take 1 tablet (300 mg total) by mouth every morning. 12/01/20   Arfeen, Arlyce Harman, MD  carvedilol (COREG) 3.125 MG tablet Take 1 tablet (3.125 mg total) by mouth 2 (two) times daily with a meal. 03/25/21   Freada Bergeron, MD  Cyanocobalamin (VITAMIN B12) 1000 MCG TBCR Take 1 tablet by mouth daily.    [provider]  diclofenac Sodium (VOLTAREN) 1 % GEL Apply 4 g topically 4 (four) times daily as needed (for pain).    [provider]  donepezil (ARICEPT) 5 MG tablet Take 1 tablet (5 mg total) by mouth at bedtime. 02/08/21 02/08/22  Arfeen, Arlyce Harman, MD  fluticasone (FLONASE) 50 MCG/ACT nasal spray Place into both nostrils as needed for allergies or rhinitis.    [provider]   furosemide (LASIX) 80 MG tablet Take 80 mg by mouth daily.    Claudia Desanctis, MD  ipratropium-albuterol (DUONEB) 0.5-2.5 (3) MG/3ML SOLN Take 3 mLs by nebulization 2 (two) times daily. 09/25/20   Swayze, Ava, DO  ketoconazole (NIZORAL) 2 % cream Apply 1 application topically 2 (two) times daily. 01/20/21   Vivi Barrack, MD  losartan (COZAAR) 100 MG tablet Take 1 tablet (100 mg total) by mouth daily. 07/29/20   Vivi Barrack, MD  nystatin (MYCOSTATIN/NYSTOP) powder Apply 1 application topically 3 (three) times daily. 01/20/21   Vivi Barrack, MD  OLANZapine (ZYPREXA) 2.5 MG tablet Take 1 tab at bedtime Patient taking differently: Take 2.5 mg by mouth at bedtime. Take 1 tab at bedtime 02/08/21   Arfeen, Arlyce Harman, MD  pantoprazole (PROTONIX) 40 MG tablet TAKE 1 TABLET BY MOUTH  TWICE DAILY BEFORE A MEAL Patient taking differently: Take 40 mg by mouth 2 (two) times daily before a meal. TAKE 1 TABLET BY MOUTH  TWICE DAILY BEFORE A MEAL 10/05/20   Vivi Barrack, MD  traMADol (ULTRAM) 50 MG tablet TAKE 1 TABLET BY MOUTH  TWICE DAILY AS NEEDED Patient taking differently: Take 50 mg by mouth 2 (two) times daily. TAKE 1 TABLET BY MOUTH  TWICE DAILY AS NEEDED 02/01/21   Vivi Barrack, MD    Physical Exam: Vitals:   03/26/21 0812 03/26/21 0919 03/26/21 0930 03/26/21 1130  BP: (!) 120/57 (!) 111/59 (!) 110/51 (!) 109/59  Pulse:  63 64 87  Resp:  14 13 16   Temp:      TempSrc:      SpO2:  93% 97% 99%  Weight:      Height:         Vitals:   03/26/21 0812 03/26/21 0919 03/26/21 0930 03/26/21 1130  BP: (!) 120/57 (!) 111/59 (!) 110/51 (!) 109/59  Pulse:  63 64 87  Resp:  14 13 16   Temp:      TempSrc:      SpO2:  93% 97% 99%  Weight:      Height:          Constitutional: Alert and oriented x 1.  Only to person . Not in any apparent distress HEENT:      Head: Normocephalic and atraumatic.  Eyes: PERLA, EOMI, Conjunctivae are normal. Sclera is non-icteric.       Mouth/Throat:  Mucous membranes are moist.       Neck: Supple with no signs of meningismus. Cardiovascular: Regular rate and rhythm. No murmurs, gallops, or rubs. 2+ symmetrical distal pulses are present . No JVD. No LE edema Respiratory: Respiratory effort normal .air entry in both lungs with fine crackles at the bases  Gastrointestinal: Soft, non tender, and non distended with positive bowel sounds.  Central adiposity Genitourinary: No CVA tenderness. Musculoskeletal: Nontender with normal range of motion in all extremities. No cyanosis, or erythema of extremities. Neurologic:  Face is symmetric. Moving all extremities. No gross focal neurologic deficits . Skin: Skin is warm, dry.  No rash or ulcers Psychiatric: Mood and affect are normal   Labs on Admission: I have personally reviewed following labs and imaging studies  CBC: Recent Labs  Lab 03/26/21 0813  WBC 8.4  HGB 12.7  HCT 39.0  MCV 95.1  PLT 409   Basic Metabolic Panel: Recent Labs  Lab 03/26/21 0813  NA 142  K 4.6  CL 104  CO2 25  GLUCOSE 146*  BUN 24*  CREATININE 1.99*  CALCIUM 9.5   GFR: Estimated Creatinine Clearance: 28.8 mL/min (A) (by C-G formula based on SCr of 1.99 mg/dL (H)). Liver Function Tests: Recent Labs  Lab 03/26/21 0813  AST 35  ALT 16  ALKPHOS 114  BILITOT 1.3*  PROT 7.9  ALBUMIN 4.0   No results for input(s): LIPASE, AMYLASE in the last 168 hours. No results for input(s): AMMONIA in the last 168 hours. Coagulation Profile: No results for input(s): INR, PROTIME in the last 168 hours. Cardiac Enzymes: No results for input(s): CKTOTAL, CKMB, CKMBINDEX, TROPONINI in the last 168 hours. BNP (last 3 results) No results for input(s): PROBNP in the last 8760 hours. HbA1C: No results for input(s): HGBA1C in the last 72 hours. CBG: Recent Labs  Lab 03/26/21 0807  GLUCAP 146*   Lipid Profile: No results for input(s): CHOL, HDL, LDLCALC, TRIG, CHOLHDL, LDLDIRECT in the last 72 hours. Thyroid  Function Tests: No results for input(s): TSH, T4TOTAL, FREET4, T3FREE, THYROIDAB in the last 72 hours. Anemia Panel: No results for input(s): VITAMINB12, FOLATE, FERRITIN, TIBC, IRON, RETICCTPCT in the last 72 hours. Urine analysis:    Component Value Date/Time   COLORURINE YELLOW 08/30/2020 1546   APPEARANCEUR CLEAR 08/30/2020 1546   APPEARANCEUR Clear 05/07/2017 1614   LABSPEC 1.014 08/30/2020 1546   PHURINE 5.5 08/30/2020 1546   GLUCOSEU NEGATIVE 08/30/2020 1546   GLUCOSEU NEGATIVE 07/31/2017 1511   HGBUR NEGATIVE 08/30/2020 1546   HGBUR trace-intact 11/28/2007 1531   BILIRUBINUR Negative 09/01/2019 1532   BILIRUBINUR Negative 05/07/2017 1614   KETONESUR NEGATIVE 08/30/2020 1546   PROTEINUR TRACE (A) 08/30/2020 1546   UROBILINOGEN 0.2 09/01/2019 1532   UROBILINOGEN 0.2 11/04/2017 1904   NITRITE NEGATIVE 08/30/2020 1546   LEUKOCYTESUR NEGATIVE 08/30/2020 1546    Radiological Exams on Admission: CT Head Wo Contrast  Result Date: 03/26/2021 CLINICAL DATA:  Mental status change with unknown cause. EXAM: CT HEAD WITHOUT CONTRAST TECHNIQUE: Contiguous axial images were obtained from the base of the skull through the vertex without intravenous contrast. COMPARISON:  Brain MRI 12/29/2019 FINDINGS: Brain: Chronic lacunar infarct at the right thalamus which has occurred since prior. Chronic small vessel ischemia in the cerebral white matter which is confluent and locations. History of subdural hematoma with bilateral burr hole. No visible residual collection or acute hemorrhage.  No hydrocephalus or masslike finding Vascular: No hyperdense vessel or unexpected calcification. Skull: Bifrontal burr holes which are unremarkable. Sinuses/Orbits: Negative IMPRESSION: 1. No acute finding. 2. Chronic small vessel disease with chronic right thalamic lacunar infarct which has occurred since a 2021 comparison. Electronically Signed   By: Monte Fantasia M.D.   On: 03/26/2021 09:20   DG Chest Port 1  View  Result Date: 03/26/2021 CLINICAL DATA:  80 year old female with altered mental status. EXAM: PORTABLE CHEST - 1 VIEW COMPARISON:  10/13/2020 FINDINGS: There is widening of the mediastinum, specifically prominence of the ascending thoracic aorta, likely secondary to the patient being rotated to the right. No cardiomegaly. Mild prominence of the pulmonary vasculature and hazy diffuse opacities, most prominent in the lung bases. No evidence of significant pleural effusion or evidence of pneumothorax. No acute osseous abnormality. IMPRESSION: 1. Widening of the mediastinum, likely secondary to technical factors (rotation of the patient to the right), however ascending aortic aneurysmal changes could appear similarly. Consider chest CT for further evaluation in the appropriate clinical setting. 2. Mild pulmonary edema. Electronically Signed   By: Ruthann Cancer MD   On: 03/26/2021 09:20     Assessment/Plan Principal Problem:   TIA (transient ischemic attack) Active Problems:   GERD   Schizophrenia, chronic condition (HCC)   Acute on chronic diastolic CHF (congestive heart failure) (HCC)   Obesity, Class III, BMI 40-49.9 (morbid obesity) ([place])   Dementia (HCC)     TIA Rule out acute stroke vs syncope Continue high intensity statin Start patient on low-dose aspirin Obtain MRI of the brain without contrast to rule out an acute infarct Will request PT/ST/OT Place patient on cardiac monitor to rule out arrhythmias Orthostatic BP checks      Acute on chronic diastolic dysfunction CHF We will place patient on IV diuretics Continue carvedilol and losartan    Hypertension with stage IV chronic kidney disease Blood pressure stable Continue carvedilol and losartan    Dementia with depression Continue donepezil, Wellbutrin and Zyprexa    COPD Not acutely exacerbated Continue bronchodilator therapy as well as inhaled steroids    DVT prophylaxis: SCD Code Status: full  code Family Communication: Greater than 50% of time was spent discussing patient's condition and plan of care with her daughter [name] at bedside.  All questions and concerns have been addressed.  She verbalizes understanding and agrees to the plan.  CODE STATUS was discussed with patient is full code. Disposition Plan: Back to previous home environment Consults called: ST/OT/PT Status: Observation    Mickie Kozikowski MD [facility]     03/26/2021, 12:35 PM

## 2021-03-26 NOTE — ED Provider Notes (Signed)
[facility] Emergency Department Provider Note   ____________________________________________    I have reviewed the triage vital signs and the nursing notes.   HISTORY  Chief Complaint Altered mental status  History of dementia  HPI [name] is a 80 y.o. female brought in by family for episode of altered mental status, near syncope.  Son reports that patient was sitting on the toilet this morning, seemed to "blackout briefly" and then came to and seem to be weak on the left side but only briefly and then was able to stand up and walk normally with assistance.  Daughter notes that patient seemed okay yesterday evening.  Reportedly the patient slept the entire night which is atypical for her.  No reports of fever or foul-smelling urine.   Past Medical History:  Diagnosis Date  . Chronic venous insufficiency   . Colon, diverticulosis Sept. 2011   outpatient colonoscopy by Dr. Cristina Gong.  Need record  . DDD (degenerative disc disease), lumbosacral   . Depression   . DJD (degenerative joint disease), multiple sites    Low back pain worst  . GERD (gastroesophageal reflux disease)   . Gout   . Gout    Uric Acid level 4.2 on 300 allopurinol  . History of cervical cancer 1972  . Hx-TIA (transient ischemic attack) 05/13/2001   Right facial numbness  . Hx-TIA (transient ischemic attack) 05/13/2001   Looking back in Epic, the patient had a hospitalization in July 2002 for evaluation of questionable TIA (s/s of right facial numbness associated with mild blurred vision in her right eye, diaphoresis, and mild confusion) with negative CT head and negative MRI/MRA and was started on Plavix after being heparinized. She was to have possible follow up with Neurology for reevaluation of the need for Pl  . Hyperlipidemia   . Hypertension   . Keloid 08/01/2018  . Memory disorder 12/20/2016  . Mitral valve prolapse 10/27/1999   Subsequent 2D echo show normal mitral  valve  . Osteoarthritis of hip    bilateral hips  . Ovarian cancer ([place]) 1972   S/P oophorectomy  . Psychosis ([place])   . Recurrent boils    History of MRSA skin infections with abscess  . Sensorineural hearing loss of both ears   . Subdural hematoma ([place]) 01/26/2017   bilateral  . Uncomplicated opioid dependence ([place]) 01/31/2018    Patient Active Problem List   Diagnosis Date Noted  . Acute on chronic diastolic CHF (congestive heart failure) ([place]) 03/26/2021  . Debility 11/06/2019  . Venous stasis of lower extremity 10/14/2019  . Hyperglycemia 05/06/2018  . Constipation 04/30/2018  . Osteoarthritis 01/31/2018  . Lower extremity edema 07/31/2017  . History of bilateral hip replacements   . Memory disorder 12/20/2016  . History of lumbar laminectomy for spinal cord decompression 02/02/2016  . Atopic dermatitis 07/09/2015  . Reactive airway disease 08/11/2014  . History of cancer 08/11/2014  . OSA (obstructive sleep apnea) 08/11/2014  . Chronic kidney disease (CKD), stage III (moderate) ([place]) 10/04/2012  . Schizophrenia, chronic condition ([place]) 01/20/2012  . Postmenopausal atrophic vaginitis 11/01/2010  . Chronic diastolic heart failure ([place]) 05/06/2010  . Allergic rhinitis 10/14/2009  . Obesity, Class II, BMI 35-39.9, with comorbidity 05/14/2009  . Chronic prescription opiate use 04/29/2008  . Normocytic anemia 09/25/2007  . Hyperlipidemia 09/03/2006  . Gout 09/03/2006  . Essential hypertension 09/03/2006  . GERD 09/03/2006    Past Surgical History:  Procedure Laterality Date  . ABDOMINAL HYSTERECTOMY  [place] Bilateral 02/12/2017   Procedure: Haskell Flirt;  Surgeon: Earnie Larsson, MD;  Location: [facility];  Service: Neurosurgery;  Laterality: Bilateral;  . CERVICAL DISCECTOMY  7/07   C5-C6  . JOINT REPLACEMENT     bilateral hip replacement  . LUMBAR DISC SURGERY     L5-S1 7/07  . LUMBAR LAMINECTOMY/DECOMPRESSION MICRODISCECTOMY N/A 02/02/2016   Procedure: L4-5  Decompression;  Surgeon: Marybelle Killings, MD;  Location: [facility];  Service: Orthopedics;  Laterality: N/A;  . [place] for ovarian cancer    Prior to Admission medications   Medication Sig Start Date End Date Taking? Authorizing Provider  acetaminophen (TYLENOL) 500 MG tablet Take 500 mg by mouth at bedtime.     [provider]  albuterol (VENTOLIN HFA) 108 (90 Base) MCG/ACT inhaler Inhale 2 puffs into the lungs every 6 (six) hours as needed for wheezing or shortness of breath. 09/30/20   Vivi Barrack, MD  allopurinol (ZYLOPRIM) 300 MG tablet TAKE 1 TABLET BY MOUTH  DAILY Patient taking differently: Take 300 mg by mouth daily. 07/29/20   Vivi Barrack, MD  amLODipine (NORVASC) 5 MG tablet Take 1 tablet (5 mg total) by mouth daily. 12/31/20 03/31/21  Freada Bergeron, MD  ascorbic acid (VITAMIN C) 500 MG tablet Take 500 mg by mouth daily.     [provider]  atorvastatin (LIPITOR) 40 MG tablet TAKE 1 TABLET BY MOUTH  DAILY Patient taking differently: Take 40 mg by mouth daily. 07/29/20   Vivi Barrack, MD  budesonide (PULMICORT) 0.25 MG/2ML nebulizer solution Take 2 mLs (0.25 mg total) by nebulization 2 (two) times daily. 10/07/20   Freada Bergeron, MD  buPROPion (WELLBUTRIN XL) 300 MG 24 hr tablet Take 1 tablet (300 mg total) by mouth every morning. 12/01/20   Arfeen, Arlyce Harman, MD  carvedilol (COREG) 3.125 MG tablet Take 1 tablet (3.125 mg total) by mouth 2 (two) times daily with a meal. 03/25/21   Freada Bergeron, MD  Cyanocobalamin (VITAMIN B12) 1000 MCG TBCR Take 1 tablet by mouth daily.    [provider]  diclofenac Sodium (VOLTAREN) 1 % GEL Apply 4 g topically 4 (four) times daily as needed (for pain).    [provider]  donepezil (ARICEPT) 5 MG tablet Take 1 tablet (5 mg total) by mouth at bedtime. 02/08/21 02/08/22  Arfeen, Arlyce Harman, MD  fluticasone (FLONASE) 50 MCG/ACT nasal spray Place into both nostrils as needed for allergies or  rhinitis.    [provider]  furosemide (LASIX) 80 MG tablet Take 80 mg by mouth daily.    Claudia Desanctis, MD  ipratropium-albuterol (DUONEB) 0.5-2.5 (3) MG/3ML SOLN Take 3 mLs by nebulization 2 (two) times daily. 09/25/20   Swayze, Ava, DO  ketoconazole (NIZORAL) 2 % cream Apply 1 application topically 2 (two) times daily. 01/20/21   Vivi Barrack, MD  losartan (COZAAR) 100 MG tablet Take 1 tablet (100 mg total) by mouth daily. 07/29/20   Vivi Barrack, MD  nystatin (MYCOSTATIN/NYSTOP) powder Apply 1 application topically 3 (three) times daily. 01/20/21   Vivi Barrack, MD  OLANZapine (ZYPREXA) 2.5 MG tablet Take 1 tab at bedtime Patient taking differently: Take 2.5 mg by mouth at bedtime. Take 1 tab at bedtime 02/08/21   Arfeen, Arlyce Harman, MD  pantoprazole (PROTONIX) 40 MG tablet TAKE 1 TABLET BY MOUTH  TWICE DAILY BEFORE A MEAL Patient taking differently: Take 40  mg by mouth 2 (two) times daily before a meal. TAKE 1 TABLET BY MOUTH  TWICE DAILY BEFORE A MEAL 10/05/20   Vivi Barrack, MD  traMADol (ULTRAM) 50 MG tablet TAKE 1 TABLET BY MOUTH  TWICE DAILY AS NEEDED Patient taking differently: Take 50 mg by mouth 2 (two) times daily. TAKE 1 TABLET BY MOUTH  TWICE DAILY AS NEEDED 02/01/21   Vivi Barrack, MD     Allergies Aspirin and Sulfamethoxazole-trimethoprim  Family History  Problem Relation Age of Onset  . Diabetes Mother   . Heart disease Mother   . Hearing loss Mother   . Stroke Mother   . Post-traumatic stress disorder Father   . Schizophrenia Maternal Grandmother   . Diabetes Maternal Grandfather   . Other Brother        brother died in mental health hospital  . Kidney cancer Neg Hx   . Bladder Cancer Neg Hx     Social History Social History   Tobacco Use  . Smoking status: Former Smoker    Packs/day: 0.10    Years: 35.00    Pack years: 3.50    Types: Cigarettes    Quit date: 04/08/2013    Years since quitting: 7.9  . Smokeless tobacco: Never Used  .  Tobacco comment: smoked a few when her son died in 02-13-13   Vaping Use  . Vaping Use: Never used  Substance Use Topics  . Alcohol use: No    Alcohol/week: 0.0 standard drinks  . Drug use: No    Review of Systems  Constitutional: No fever Eyes: No discharge ENT: No sore throat. Cardiovascular: Denies chest pain. Respiratory: Mild cough, history of CHF Gastrointestinal: No abdominal pain.   no vomiting.   Genitourinary: Negative for foul-smelling urine Musculoskeletal: Chronic neck pain Skin: Negative for rash. Neurological: Negative for headaches, as above   ____________________________________________   PHYSICAL EXAM:  VITAL SIGNS: ED Triage Vitals  Enc Vitals Group     BP 03/26/21 0812 (!) 120/57     Pulse Rate 03/26/21 0807 71     Resp 03/26/21 0807 20     Temp 03/26/21 0807 97.7 F (36.5 C)     Temp Source 03/26/21 0807 Oral     SpO2 03/26/21 0807 98 %     Weight 03/26/21 0808 120.2 kg (265 lb)     Height 03/26/21 0808 1.6 m (5\' 3" )     Head Circumference --      Peak Flow --      Pain Score 03/26/21 0808 0     Pain Loc --      Pain Edu? --      Excl. in [place]? --     Constitutional: Alert, no acute distress Eyes: Conjunctivae are normal.  Head: Atraumatic. Nose: No congestion/rhinnorhea. Mouth/Throat: Mucous membranes are moist.    Cardiovascular: Normal rate, regular rhythm. Grossly normal heart sounds.  Good peripheral circulation. Respiratory: Normal respiratory effort.  No retractions. Lungs CTAB. Gastrointestinal: Soft and nontender. No distention.    Musculoskeletal: 2+ edema bilaterally lower legs Neurologic:  Normal speech and language. No gross focal neurologic deficits are appreciated.  Skin:  Skin is warm, dry and intact. No rash noted. Psychiatric: Mood and affect are normal. Speech and behavior are normal.  ____________________________________________   LABS (all labs ordered are listed, but only abnormal results are displayed)  Labs  Reviewed  CBC - Abnormal; Notable for the following components:      Result Value  RDW 15.7 (*)    All other components within normal limits  COMPREHENSIVE METABOLIC PANEL - Abnormal; Notable for the following components:   Glucose, Bld 146 (*)    BUN 24 (*)    Creatinine, Ser 1.99 (*)    Total Bilirubin 1.3 (*)    GFR, Estimated 25 (*)    All other components within normal limits  CBG MONITORING, ED - Abnormal; Notable for the following components:   Glucose-Capillary 146 (*)    All other components within normal limits  SARS CORONAVIRUS 2 (TAT 6-24 HRS)  URINALYSIS, COMPLETE (UACMP) WITH MICROSCOPIC   ____________________________________________  EKG  ED ECG REPORT I, Lavonia Drafts, the attending physician, personally viewed and interpreted this ECG.  Date: 03/26/2021  Rhythm: normal sinus rhythm QRS Axis: normal Intervals: normal ST/T Wave abnormalities: normal Narrative Interpretation: no evidence of acute ischemia  ____________________________________________  RADIOLOGY  CT head ____________________________________________   PROCEDURES  Procedure(s) performed: No  Procedures   Critical Care performed: No ____________________________________________   INITIAL IMPRESSION / ASSESSMENT AND PLAN / ED COURSE  Pertinent labs & imaging results that were available during my care of the patient were reviewed by me and considered in my medical decision making (see chart for details).  Patient presents with episode as above.  Question near syncopal episode versus possible TIA, .  Regardless we will send for CT head, obtain labs, urine, chest x-ray and monitor carefully here in the emergency department.  CT head is overall reassuring  Chest x-ray demonstrates pulmonary edema.  Lab work demonstrates elevated BUN to creatinine ratio.  Son has arrived and notes that patient had been sitting on the toilet, became unresponsive briefly, then had left sided weakness  and difficulty speaking, symptoms resolved within about 15 minutes.  Suspicious for TIA.  Have discussed with Dr. Francine Graven of the hospital service for admission      ____________________________________________   FINAL CLINICAL IMPRESSION(S) / ED DIAGNOSES  Final diagnoses:  TIA (transient ischemic attack)  Acute pulmonary edema ([place])  Acute on chronic congestive heart failure, unspecified heart failure type [facility])        Note:  This document was prepared using Dragon voice recognition software and may include unintentional dictation errors.   Lavonia Drafts, MD 03/26/21 [phone]

## 2021-03-26 NOTE — ED Notes (Signed)
Informed RN bed assigned 

## 2021-03-26 NOTE — Progress Notes (Signed)
OT Cancellation Note  Patient Details Name: [name] MRN: [id number] DOB: 03-18-1941   Cancelled Treatment:    Reason Eval/Treat Not Completed: Patient at procedure or test/ unavailable. OT order received and chart reviewed. Pt currently off the unit getting MRI. OT will follow up when pt is next available.   Darleen Crocker, MS, OTR/L , CBIS ascom [phone]  03/26/21, 1:45 PM   03/26/2021, 1:45 PM

## 2021-03-26 NOTE — ED Notes (Addendum)
Attempted In/Out cath with assistance, but was unsuccessful. Pt was very aggressive and tense, was continuously kicking and screaming,  so unable to complete and get a urine sample. Corky Downs, MD notified. [place] returned to place.

## 2021-03-27 ENCOUNTER — Observation Stay: Payer: Medicare Other

## 2021-03-27 DIAGNOSIS — I700 Atherosclerosis of aorta: Secondary | ICD-10-CM | POA: Diagnosis not present

## 2021-03-27 DIAGNOSIS — F0391 Unspecified dementia with behavioral disturbance: Secondary | ICD-10-CM | POA: Diagnosis not present

## 2021-03-27 DIAGNOSIS — J439 Emphysema, unspecified: Secondary | ICD-10-CM | POA: Diagnosis not present

## 2021-03-27 DIAGNOSIS — I2510 Atherosclerotic heart disease of native coronary artery without angina pectoris: Secondary | ICD-10-CM | POA: Diagnosis not present

## 2021-03-27 DIAGNOSIS — R4689 Other symptoms and signs involving appearance and behavior: Secondary | ICD-10-CM | POA: Diagnosis not present

## 2021-03-27 DIAGNOSIS — R918 Other nonspecific abnormal finding of lung field: Secondary | ICD-10-CM | POA: Diagnosis not present

## 2021-03-27 LAB — BASIC METABOLIC PANEL
Anion gap: 10 (ref 5–15)
BUN: 22 mg/dL (ref 8–23)
CO2: 26 mmol/L (ref 22–32)
Calcium: 9 mg/dL (ref 8.9–10.3)
Chloride: 106 mmol/L (ref 98–111)
Creatinine, Ser: 1.41 mg/dL — ABNORMAL HIGH (ref 0.44–1.00)
GFR, Estimated: 38 mL/min — ABNORMAL LOW (ref >60)
Glucose, Bld: 117 mg/dL — ABNORMAL HIGH (ref 70–99)
Potassium: 3.3 mmol/L — ABNORMAL LOW (ref 3.5–5.1)
Sodium: 142 mmol/L (ref 135–145)

## 2021-03-27 LAB — GLUCOSE, CAPILLARY: Glucose-Capillary: 161 mg/dL — ABNORMAL HIGH (ref 70–99)

## 2021-03-27 LAB — CBC
HCT: 36 % (ref 36.0–46.0)
Hemoglobin: 12 g/dL (ref 12.0–15.0)
MCH: 31.2 pg (ref 26.0–34.0)
MCHC: 33.3 g/dL (ref 30.0–36.0)
MCV: 93.5 fL (ref 80.0–100.0)
Platelets: 194 10*3/uL (ref 150–400)
RBC: 3.85 MIL/uL — ABNORMAL LOW (ref 3.87–5.11)
RDW: 15.6 % — ABNORMAL HIGH (ref 11.5–15.5)
WBC: 7.2 10*3/uL (ref 4.0–10.5)
nRBC: 0 % (ref 0.0–0.2)

## 2021-03-27 LAB — LIPID PANEL
Cholesterol: 189 mg/dL (ref 0–200)
HDL: 36 mg/dL — ABNORMAL LOW (ref >40)
LDL Cholesterol: 126 mg/dL — ABNORMAL HIGH (ref 0–99)
Total CHOL/HDL Ratio: 5.3 RATIO
Triglycerides: 134 mg/dL (ref <150)
VLDL: 27 mg/dL (ref 0–40)

## 2021-03-27 LAB — MAGNESIUM: Magnesium: 1.7 mg/dL (ref 1.7–2.4)

## 2021-03-27 LAB — SARS CORONAVIRUS 2 (TAT 6-24 HRS): SARS Coronavirus 2: NEGATIVE

## 2021-03-27 MED ORDER — ENOXAPARIN SODIUM 60 MG/0.6ML IJ SOSY
0.5000 mg/kg | PREFILLED_SYRINGE | INTRAMUSCULAR | Status: DC
  Administered 2021-03-27 – 2021-03-29 (×3): 60 mg via SUBCUTANEOUS
  Filled 2021-03-27 (×3): qty 0.6

## 2021-03-27 MED ORDER — BUDESONIDE 0.5 MG/2ML IN SUSP: 0.5000 mg | Freq: Two times a day (BID) | RESPIRATORY_TRACT | Status: DC | PRN

## 2021-03-27 MED ORDER — FUROSEMIDE 40 MG PO TABS
80.0000 mg | ORAL_TABLET | Freq: Every day | ORAL | Status: DC
  Administered 2021-03-27 – 2021-03-29 (×3): 80 mg via ORAL
  Filled 2021-03-27 (×3): qty 2

## 2021-03-27 MED ORDER — POTASSIUM CHLORIDE CRYS ER 20 MEQ PO TBCR
40.0000 meq | EXTENDED_RELEASE_TABLET | Freq: Once | ORAL | Status: AC
  Administered 2021-03-27: 40 meq via ORAL
  Filled 2021-03-27: qty 2

## 2021-03-27 MED ORDER — TRAMADOL HCL 50 MG PO TABS
50.0000 mg | ORAL_TABLET | Freq: Every day | ORAL | Status: DC
Start: 2021-03-27 — End: 2021-03-29
  Administered 2021-03-27 – 2021-03-29 (×3): 50 mg via ORAL
  Filled 2021-03-27 (×3): qty 1

## 2021-03-27 NOTE — Progress Notes (Addendum)
PROGRESS NOTE    [name]  VBT:[id number] DOB: 12-08-1940 DOA: 03/26/2021 PCP: Vivi Barrack, MD  Outpatient Specialists: cardiology    Brief Narrative:   [name] is a 80 y.o. female with medical history significant for chronic diastolic CHF last 2D echo done in 20s1 showed EF of 65 to 70% with grade 1 diastolic dysfunction, chronic kidney disease stage III - IV, hypertension, gout, COPD who was brought into the ER by family for evaluation. Patient has dementia and is unable to provide any history.  Her daughter states that she and her brother alternate taking care of their mother and that her brother was taking care of the mother overnight.  They were concerned that patient had not voided all through the night because she is usually incontinent of urine and is on furosemide 80 mg daily.  Her brother walked the patient to the bathroom and while she was sitting on the commode he states that she suddenly had an episode that he describes as generalized shaking spell, transient unresponsiveness, tongue hanging out of her mouth and leaning towards her left side. According to the daughter this lasted about 15 seconds and then patient became more awake but did not respond to any verbal stimuli.  Her son tried to get her up but noticed that her left side was weak.  This improved after a few minutes and patient was able to ambulate back to her room using her Rollator. They decided to bring the patient to the ER to get evaluated for possible stroke.   Assessment & Plan:   Principal Problem:   TIA (transient ischemic attack) Active Problems:   GERD   Schizophrenia, chronic condition (HCC)   Acute on chronic diastolic CHF (congestive heart failure) (HCC)   Obesity, Class III, BMI 40-49.9 (morbid obesity) ([place])   Dementia ([place])   # TIA With signs possible seizure. Brief episode of left sided weakness at home with preceding possible loc preceded by convulsive activity. Appears to  be back to baseline. Underlying dementia. CT shows evidence prior stroke. MRI degraded by motion, no acute abnormalities seen. Non-focal neuro exam currently. pvcs on tele. - cont home atorvastatin. Not on aspirin for hx of bleeding with aspirin - pt/ot consulted - neurology consulted  # Diastolic heart vailure Appears compensated - home lasix  # HTN Here bp wnl - cont home amlod, coreg, losartan, lasix  # Widened mediastinum On cxr. Radiology thinks likely positional, aneurysm on ddx - will check ct of chest. Non-con given contrast shortage.  # ckd 3 Kidney function at baseline  # Hypokalemia Likely 2/2 diuretics - f/u mg, giving kcl 40  # Dementia with depression - Continue donepezil, Wellbutrin and Zyprexa  # COPD Not acutely exacerbated - home pulmicort prn   DVT prophylaxis: lovenox Code Status: full Family Communication: daughter updated @ bedside 5/29  Level of care: Progressive Cardiac Status is: Observation  Pt will be here at least 2 midnights. I communicated with the UR nurse who advises keeping the patient obs.  Dispo: The patient is from: Home              Anticipated d/c is to: Home              Patient currently is not medically stable to d/c.   Difficult to place patient No        Consultants:  neurology  Procedures: none  Antimicrobials:  none    Subjective: This morning bit more confused  than normal per daughter. No pain, no n/v/d.   Objective: Vitals:   03/26/21 2037 03/26/21 2352 03/27/21 0422 03/27/21 0727  BP:  139/83 (!) 123/35 (!) 123/59  Pulse: 76 76 76 72  Resp: 16 18 16 18   Temp:  99.6 F (37.6 C) 98.7 F (37.1 C) 97.8 F (36.6 C)  TempSrc:  Oral Oral   SpO2: 96% 92% 95% 94%  Weight:    122.4 kg  Height:        Intake/Output Summary (Last 24 hours) at 03/27/2021 0757 Last data filed at 03/27/2021 0500 Gross per 24 hour  Intake --  Output 600 ml  Net -600 ml   Filed Weights   03/26/21 0808 03/26/21 1840  03/27/21 0727  Weight: 120.2 kg 124.1 kg 122.4 kg    Examination:  General exam: Appears calm and comfortable  Respiratory system: Clear to auscultation. Respiratory effort normal. Cardiovascular system: S1 & S2 heard, RRR. No JVD, murmurs, rubs, gallops or clicks. No pedal edema. Gastrointestinal system: Abdomen is obese , soft and nontender. No organomegaly or masses felt. Normal bowel sounds heard. Central nervous system: Alert and oriented. Moving all 4 extremities. Cn 2-12 grossly intact. 5/5 strength Extremities: trace LE edema Skin: No rashes, lesions or ulcers Psychiatry: confused     Data Reviewed: I have personally reviewed following labs and imaging studies  CBC: Recent Labs  Lab 03/26/21 0813 03/27/21 0535  WBC 8.4 7.2  HGB 12.7 12.0  HCT 39.0 36.0  MCV 95.1 93.5  PLT 208 284   Basic Metabolic Panel: Recent Labs  Lab 03/26/21 0813 03/27/21 0535  NA 142 142  K 4.6 3.3*  CL 104 106  CO2 25 26  GLUCOSE 146* 117*  BUN 24* 22  CREATININE 1.99* 1.41*  CALCIUM 9.5 9.0   GFR: Estimated Creatinine Clearance: 41.1 mL/min (A) (by C-G formula based on SCr of 1.41 mg/dL (H)). Liver Function Tests: Recent Labs  Lab 03/26/21 0813  AST 35  ALT 16  ALKPHOS 114  BILITOT 1.3*  PROT 7.9  ALBUMIN 4.0   No results for input(s): LIPASE, AMYLASE in the last 168 hours. No results for input(s): AMMONIA in the last 168 hours. Coagulation Profile: No results for input(s): INR, PROTIME in the last 168 hours. Cardiac Enzymes: No results for input(s): CKTOTAL, CKMB, CKMBINDEX, TROPONINI in the last 168 hours. BNP (last 3 results) No results for input(s): PROBNP in the last 8760 hours. HbA1C: No results for input(s): HGBA1C in the last 72 hours. CBG: Recent Labs  Lab 03/26/21 0807  GLUCAP 146*   Lipid Profile: Recent Labs    03/27/21 0535  CHOL 189  HDL 36*  LDLCALC 126*  TRIG 134  CHOLHDL 5.3   Thyroid Function Tests: No results for input(s): TSH,  T4TOTAL, FREET4, T3FREE, THYROIDAB in the last 72 hours. Anemia Panel: No results for input(s): VITAMINB12, FOLATE, FERRITIN, TIBC, IRON, RETICCTPCT in the last 72 hours. Urine analysis:    Component Value Date/Time   COLORURINE YELLOW (A) 03/26/2021 1155   APPEARANCEUR HAZY (A) 03/26/2021 1155   APPEARANCEUR Clear 05/07/2017 1614   LABSPEC 1.014 03/26/2021 1155   PHURINE 5.0 03/26/2021 1155   GLUCOSEU NEGATIVE 03/26/2021 1155   GLUCOSEU NEGATIVE 07/31/2017 1511   HGBUR NEGATIVE 03/26/2021 1155   HGBUR trace-intact 11/28/2007 1531   BILIRUBINUR NEGATIVE 03/26/2021 1155   BILIRUBINUR Negative 09/01/2019 1532   BILIRUBINUR Negative 05/07/2017 [place] 03/26/2021 1155   PROTEINUR 30 (A) 03/26/2021 1155  UROBILINOGEN 0.2 09/01/2019 1532   UROBILINOGEN 0.2 11/04/2017 1904   NITRITE NEGATIVE 03/26/2021 [place] 03/26/2021 1155   Sepsis Labs: @LABRCNTIP (procalcitonin:4,lacticidven:4)  ) Recent Results (from the past 240 hour(s))  SARS CORONAVIRUS 2 (TAT 6-24 HRS) Nasopharyngeal Nasopharyngeal Swab     Status: None   Collection Time: 03/26/21 11:55 AM   Specimen: Nasopharyngeal Swab  Result Value Ref Range Status   SARS Coronavirus 2 NEGATIVE NEGATIVE Final    Comment: (NOTE) SARS-CoV-2 target nucleic acids are NOT DETECTED.  The SARS-CoV-2 RNA is generally detectable in upper and lower respiratory specimens during the acute phase of infection. Negative results do not preclude SARS-CoV-2 infection, do not rule out co-infections with other pathogens, and should not be used as the sole basis for treatment or other patient management decisions. Negative results must be combined with clinical observations, patient history, and epidemiological information. The expected result is Negative.  Fact Sheet for Patients: SugarRoll.be  Fact Sheet for Healthcare Providers: https://www.woods-mathews.com/  This  test is not yet approved or cleared by the [place] FDA and  has been authorized for detection and/or diagnosis of SARS-CoV-2 by FDA under an Emergency Use Authorization (EUA). This EUA will remain  in effect (meaning this test can be used) for the duration of the COVID-19 declaration under Se ction 564(b)(1) of the Act, 21 U.S.C. section 360bbb-3(b)(1), unless the authorization is terminated or revoked sooner.  Performed at [facility], [place] [address], [place], [place] [address]          Radiology Studies: CT Head Wo Contrast  Result Date: 03/26/2021 CLINICAL DATA:  Mental status change with unknown cause. EXAM: CT HEAD WITHOUT CONTRAST TECHNIQUE: Contiguous axial images were obtained from the base of the skull through the vertex without intravenous contrast. COMPARISON:  Brain MRI 12/29/2019 FINDINGS: Brain: Chronic lacunar infarct at the right thalamus which has occurred since prior. Chronic small vessel ischemia in the cerebral white matter which is confluent and locations. History of subdural hematoma with bilateral burr hole. No visible residual collection or acute hemorrhage. No hydrocephalus or masslike finding Vascular: No hyperdense vessel or unexpected calcification. Skull: Bifrontal burr holes which are unremarkable. Sinuses/Orbits: Negative IMPRESSION: 1. No acute finding. 2. Chronic small vessel disease with chronic right thalamic lacunar infarct which has occurred since a 2021 comparison. Electronically Signed   By: Monte Fantasia M.D.   On: 03/26/2021 09:20   MR BRAIN WO CONTRAST  Result Date: 03/26/2021 CLINICAL DATA:  TIA. EXAM: MRI HEAD WITHOUT CONTRAST TECHNIQUE: Multiplanar, multiecho pulse sequences of the brain and surrounding structures were obtained without intravenous contrast. COMPARISON:  CT head Mar 26, 2021. FINDINGS: Incomplete and motion limited exam with only DWI/ADC sequences performed. No evidence of an obvious/large acute infarct. IMPRESSION:  Incomplete and motion limited exam with only DWI/ADC sequences performed. No evidence of an obvious/large acute infarct. If the patient is able, a repeat MRI could provide more sensitive/complete evaluation. Electronically Signed   By: Margaretha Sheffield MD   On: 03/26/2021 14:18   DG Chest Port 1 View  Result Date: 03/26/2021 CLINICAL DATA:  80 year old female with altered mental status. EXAM: PORTABLE CHEST - 1 VIEW COMPARISON:  10/13/2020 FINDINGS: There is widening of the mediastinum, specifically prominence of the ascending thoracic aorta, likely secondary to the patient being rotated to the right. No cardiomegaly. Mild prominence of the pulmonary vasculature and hazy diffuse opacities, most prominent in the lung bases. No evidence of significant pleural effusion or evidence  of pneumothorax. No acute osseous abnormality. IMPRESSION: 1. Widening of the mediastinum, likely secondary to technical factors (rotation of the patient to the right), however ascending aortic aneurysmal changes could appear similarly. Consider chest CT for further evaluation in the appropriate clinical setting. 2. Mild pulmonary edema. Electronically Signed   By: Ruthann Cancer MD   On: 03/26/2021 09:20        Scheduled Meds: .  stroke: mapping our early stages of recovery book   Does not apply Once  . acetaminophen  500 mg Oral QHS  . allopurinol  200 mg Oral Daily  . ascorbic acid  500 mg Oral Daily  . atorvastatin  40 mg Oral Daily  . buPROPion  300 mg Oral q morning  . carvedilol  3.125 mg Oral BID WC  . donepezil  5 mg Oral QHS  . furosemide  80 mg Intravenous BID  . ipratropium-albuterol  3 mL Nebulization BID  . losartan  100 mg Oral Daily  . OLANZapine  2.5 mg Oral QHS  . pantoprazole  40 mg Oral BID AC  . sodium chloride flush  3 mL Intravenous Q12H  . traMADol  50 mg Oral Daily  . vitamin B-12  1,000 mcg Oral Daily   Continuous Infusions: . sodium chloride       LOS: 0 days    Time spent: 30  min    Desma Maxim, MD [facility]   If 7PM-7AM, please contact night-coverage [email or web address] Password [facility] 03/27/2021, 7:57 AM

## 2021-03-27 NOTE — Plan of Care (Signed)
  Problem: Education: Goal: Knowledge of General Education information will improve Description: Including pain rating scale, medication(s)/side effects and non-pharmacologic comfort measures Outcome: Progressing   Problem: Pain Managment: Goal: General experience of comfort will improve Outcome: Progressing   Problem: Safety: Goal: Ability to remain free from injury will improve Outcome: Progressing   

## 2021-03-27 NOTE — Evaluation (Signed)
Physical Therapy Evaluation Patient Details Name: [name] MRN: [id number] DOB: 1941/04/12 Today's Date: 03/27/2021   History of Present Illness  Per MD notes: Pt is a 80 y.o. female with medical history significant for chronic diastolic CHF last 2D echo done in 20s1 showed EF of 65 to 70% with grade 1 diastolic dysfunction, chronic kidney disease stage III - IV, hypertension, gout, COPD who was brought into the ER by family for evaluation.  Per son pt was in the bathroom and while she was sitting on the commode he states that she suddenly had an episode that he describes as generalized shaking spell, transient unresponsiveness, tongue hanging out of her mouth and leaning towards her left side.  MD assessement includes: TIA with possible seizure, widened mediastinum, and hypokalemia.    Clinical Impression  Pt with dementia and unable to provide any history with below information from daughter at bedside.  Pt was able to follow most 1-step commands with extra time and cuing, however, and put forth good effort with functional tasks.  Pt required physical assistance for trunk and BLE control with bed mobility tasks likely secondary to pt typically sleeps in a recliner at home. Pt was able to stand and ambulate without physical assistance and was steady during gait.  Pt's SpO2 and HR were both WNL on room air.  Pt will benefit from HHPT upon discharge to safely address deficits listed in patient problem list for decreased caregiver assistance and eventual return to PLOF.     Follow Up Recommendations Home health PT;Supervision/Assistance - 24 hour    Equipment Recommendations  None recommended by PT    Recommendations for Other Services       Precautions / Restrictions Precautions Precautions: Fall Restrictions Weight Bearing Restrictions: No Other Position/Activity Restrictions: Seizure precautions      Mobility  Bed Mobility Overal bed mobility: Needs Assistance Bed Mobility:  Supine to Sit;Sit to Supine     Supine to sit: Min assist;Mod assist Sit to supine: Min assist;Mod assist   General bed mobility comments: Min to mod A for BLE and trunk positioning    Transfers Overall transfer level: Needs assistance Equipment used: Rolling walker (2 wheeled) Transfers: Sit to/from Stand Sit to Stand: Min guard         General transfer comment: Fair eccentric and concentric control  Ambulation/Gait Ambulation/Gait assistance: Min guard Gait Distance (Feet): 10 Feet x 1, 6 Feet x 1 Assistive device: Rolling walker (2 wheeled) Gait Pattern/deviations: Step-through pattern;Decreased step length - right;Decreased step length - left Gait velocity: decreased   General Gait Details: Slow cadence with cues for general sequencing but steady without LOB or buckling  Stairs            Wheelchair Mobility    Modified Rankin (Stroke Patients Only)       Balance Overall balance assessment: Needs assistance   Sitting balance-Leahy Scale: Good     Standing balance support: Bilateral upper extremity supported;During functional activity Standing balance-Leahy Scale: Fair                               Pertinent Vitals/Pain Pain Assessment: No/denies pain (Pt reported no pain but dtr reported recent HA and c/o neck pain)    Home Living Family/patient expects to be discharged to:: Private residence Living Arrangements: Alone Available Help at Discharge: Family;Available 24 hours/day Type of Home: House Home Access: Ramped entrance     Home  Layout: One level Home Equipment: Walker - 4 wheels;Transport chair;Walker - 2 wheels Additional Comments: History provided by dtr secondary to pt with dementia; pt's son and dtr provide rotating 24/7 supervision    Prior Function Level of Independence: Needs assistance   Gait / Transfers Assistance Needed: Mod Ind amb with a rollator mostly household distances but dtr stated pt could likely ambualte  limited community distances, no fall history  ADL's / Homemaking Assistance Needed: Son and dtr assist with ADLs        Hand Dominance        Extremity/Trunk Assessment   Upper Extremity Assessment Upper Extremity Assessment: Generalized weakness    Lower Extremity Assessment Lower Extremity Assessment: Generalized weakness       Communication      Cognition Arousal/Alertness: Awake/alert Behavior During Therapy: WFL for tasks assessed/performed Overall Cognitive Status: History of cognitive impairments - at baseline                                 General Comments: Pt unable to provide any assistance with history taking but was able to follow 1-step commands with extra time and cuing      General Comments      Exercises Other Exercises Other Exercises: Pt's daughter provided education on activity progression and risk of decreased strength/power with over reliance on use of lift chair to assist pt with standing   Assessment/Plan    PT Assessment Patient needs continued PT services  PT Problem List Decreased strength;Decreased range of motion;Decreased activity tolerance;Decreased balance;Decreased mobility;Decreased knowledge of use of DME       PT Treatment Interventions DME instruction;Gait training;Functional mobility training;Therapeutic activities;Therapeutic exercise;Balance training;Patient/family education    PT Goals (Current goals can be found in the Care Plan section)  Acute Rehab PT Goals Patient Stated Goal: To get stronger PT Goal Formulation: With family Time For Goal Achievement: 04/09/21 Potential to Achieve Goals: Good    Frequency Min 2X/week   Barriers to discharge        Co-evaluation               AM-PAC PT "6 Clicks" Mobility  Outcome Measure Help needed turning from your back to your side while in a flat bed without using bedrails?: A Lot Help needed moving from lying on your back to sitting on the side of a  flat bed without using bedrails?: A Lot Help needed moving to and from a bed to a chair (including a wheelchair)?: A Lot Help needed standing up from a chair using your arms (e.g., wheelchair or bedside chair)?: A Little Help needed to walk in hospital room?: A Little Help needed climbing 3-5 steps with a railing? : A Lot 6 Click Score: 14    End of Session Equipment Utilized During Treatment: Gait belt Activity Tolerance: Patient tolerated treatment well Patient left: in bed;with call bell/phone within reach;with bed alarm set;with nursing/sitter in room;with SCD's reapplied;with family/visitor present (Pt's dtr requested pt remain in bed) Nurse Communication: Mobility status PT Visit Diagnosis: Muscle weakness (generalized) (M62.81);Difficulty in walking, not elsewhere classified (R26.2)    Time: 0175-1025 PT Time Calculation (min) (ACUTE ONLY): 31 min   Charges:   PT Evaluation $PT Eval Moderate Complexity: 1 Mod PT Treatments $Therapeutic Activity: 8-22 mins        D. Scott Styles Fambro PT, DPT 03/27/21, 11:46 AM

## 2021-03-27 NOTE — Care Management Obs Status (Signed)
[place] NOTIFICATION   Patient Details  Name: [name] MRN: [id number] Date of Birth: 02/20/1941   Medicare Observation Status Notification Given:  Yes  Explained form and answered questions with patient's daughter, [name].   Izola Price, RN 03/27/2021, 3:37 PM

## 2021-03-27 NOTE — Evaluation (Signed)
Occupational Therapy Evaluation Patient Details Name: [name] MRN: [id number] DOB: Sep 23, 1941 Today's Date: 03/27/2021    History of Present Illness Per MD notes: Pt is a 80 y.o. female with medical history significant for chronic diastolic CHF last 2D echo done in 20s1 showed EF of 65 to 70% with grade 1 diastolic dysfunction, chronic kidney disease stage III - IV, hypertension, gout, COPD who was brought into the ER by family for evaluation.  Per son pt was in the bathroom and while she was sitting on the commode he states that she suddenly had an episode that he describes as generalized shaking spell, transient unresponsiveness, tongue hanging out of her mouth and leaning towards her left side.  MD assessement includes: TIA with possible seizure, widened mediastinum, and hypokalemia.   Clinical Impression   Pt seen for OT evaluation this date in setting of acute hospitalization d/t seizure-like activity. Pt's daughter, [name], present in the room and reports that pt waxes and wanes with ability to stand/needing assistance and uses rollator for fxl mobility at baseline. States that her and her brother rotate caring for patient. Pt presents this date with general deconditioning, weakness, and decreased activity tolerance superimposed on baseline cognitive decline which impacts her ability to perform ADLs at her highest attainable/safest level. This date on OT ADL assessment, pt requires MIN/MOD A as well as MIN verbal/tactile cues to sequence seated ADLs, MAX A for LB ADLs including peri care in standing. MOD A for ADL transfers. Will continue to follow acutely and anticipate pt will benefit from [facility] f/u.     Follow Up Recommendations  Home health OT;Supervision/Assistance - 24 hour    Equipment Recommendations  3 in 1 bedside commode;Tub/shower seat    Recommendations for Other Services       Precautions / Restrictions Precautions Precautions: Fall Restrictions Weight Bearing  Restrictions: No Other Position/Activity Restrictions: Seizure precautions      Mobility Bed Mobility Overal bed mobility: Needs Assistance Bed Mobility: Supine to Sit;Sit to Supine     Supine to sit: Min assist;Mod assist Sit to supine: Mod assist   General bed mobility comments: increased assist for LE mgt back to bed    Transfers Overall transfer level: Needs assistance Equipment used: Rolling walker (2 wheeled);1 person hand held assist Transfers: Sit to/from Stand Sit to Stand: Mod assist         General transfer comment: MOD A for STS this date, potentially requiring increased assist d/t fatigue (having just had BM and gotten cleaned up with nursing prior to OT evaluation)    Balance Overall balance assessment: Needs assistance   Sitting balance-Leahy Scale: Good     Standing balance support: Bilateral upper extremity supported;During functional activity Standing balance-Leahy Scale: Fair                             ADL either performed or assessed with clinical judgement   ADL Overall ADL's : Needs assistance/impaired                                       General ADL Comments: MIN/MOD A as well as MIN verbal/tactile cues to sequence seated ADLs, MAX A for LB ADLs including peri care in standing. MOD A for ADL transfers.     Vision Patient Visual Report: No change from baseline  Perception     Praxis      Pertinent Vitals/Pain Pain Assessment: No/denies pain     Hand Dominance Left   Extremity/Trunk Assessment Upper Extremity Assessment Upper Extremity Assessment: Generalized weakness   Lower Extremity Assessment Lower Extremity Assessment: Generalized weakness       Communication Communication Communication: HOH   Cognition Arousal/Alertness: Awake/alert Behavior During Therapy: WFL for tasks assessed/performed Overall Cognitive Status: History of cognitive impairments - at baseline                                  General Comments: Pt requires gentle re-direction throughout, able to follow simple one step commands with increased time inconsistently. Requires cues for sequence/safety throughout. Responds best when her daughter [name] cues her. Not oriented to place, situation or time, only self.   General Comments       Exercises Other Exercises Other Exercises: ed with pt's dtr [name] re: role of OT.   Shoulder Instructions      Home Living Family/patient expects to be discharged to:: Private residence Living Arrangements: Alone Available Help at Discharge: Family;Available 24 hours/day Type of Home: House Home Access: Ramped entrance     Home Layout: One level     Bathroom Shower/Tub: Occupational psychologist: Handicapped height (with arm rests)     Home Equipment: Walker - 4 wheels;Transport chair;Walker - 2 wheels   Additional Comments: History provided by dtr secondary to pt with dementia; pt's son and dtr provide rotating 24/7 supervision      Prior Functioning/Environment Level of Independence: Needs assistance  Gait / Transfers Assistance Needed: Mod Ind amb with a rollator mostly household distances but dtr stated pt could likely ambualte limited community distances, no fall history ADL's / Homemaking Assistance Needed: Son and dtr assist with ADLs            OT Problem List: Decreased strength;Decreased activity tolerance;Impaired balance (sitting and/or standing);Decreased cognition;Decreased safety awareness;Decreased knowledge of use of DME or AE;Obesity      OT Treatment/Interventions: Self-care/ADL training;DME and/or AE instruction;Therapeutic activities;Balance training;Therapeutic exercise;Patient/family education    OT Goals(Current goals can be found in the care plan section) Acute Rehab OT Goals Patient Stated Goal: To get stronger OT Goal Formulation: With family Time For Goal Achievement: 04/10/21 Potential to Achieve  Goals: Good ADL Goals Pt Will Perform Grooming: with min assist;standing (sink-side to complete 1-2 g/h tasks to increase fxl standing tolerance with LRAD) Pt Will Transfer to Toilet: with min guard assist;stand pivot transfer;bedside commode Pt Will Perform Toileting - Clothing Manipulation and hygiene: with min assist;with mod assist;sit to/from stand (with pt contributing to at least anterior peri care to reduce caregiver burden)  OT Frequency: Min 1X/week   Barriers to D/C:            Co-evaluation              AM-PAC OT "6 Clicks" Daily Activity     Outcome Measure Help from another person eating meals?: None Help from another person taking care of personal grooming?: A Little Help from another person toileting, which includes using toliet, bedpan, or urinal?: A Lot Help from another person bathing (including washing, rinsing, drying)?: A Lot Help from another person to put on and taking off regular upper body clothing?: A Little Help from another person to put on and taking off regular lower body clothing?: A Lot 6 Click Score: 16  End of Session Equipment Utilized During Treatment: Gait belt;Rolling walker Nurse Communication: Mobility status  Activity Tolerance: Patient tolerated treatment well Patient left: in bed;with call bell/phone within reach;with bed alarm set;with family/visitor present  OT Visit Diagnosis: Unsteadiness on feet (R26.81);Muscle weakness (generalized) (M62.81)                Time: 7445-1460 OT Time Calculation (min): 46 min Charges:  OT General Charges $OT Visit: 1 Visit OT Evaluation $OT Eval Moderate Complexity: 1 Mod OT Treatments $Self Care/Home Management : 23-37 mins $Therapeutic Activity: 8-22 mins  Gerrianne Scale, MS, OTR/L ascom [phone] 03/27/21, 4:34 PM

## 2021-03-28 ENCOUNTER — Observation Stay: Payer: Medicare Other

## 2021-03-28 DIAGNOSIS — R569 Unspecified convulsions: Secondary | ICD-10-CM

## 2021-03-28 LAB — BASIC METABOLIC PANEL
Anion gap: 10 (ref 5–15)
BUN: 21 mg/dL (ref 8–23)
CO2: 26 mmol/L (ref 22–32)
Calcium: 9.3 mg/dL (ref 8.9–10.3)
Chloride: 107 mmol/L (ref 98–111)
Creatinine, Ser: 1.42 mg/dL — ABNORMAL HIGH (ref 0.44–1.00)
GFR, Estimated: 38 mL/min — ABNORMAL LOW (ref >60)
Glucose, Bld: 130 mg/dL — ABNORMAL HIGH (ref 70–99)
Potassium: 3.8 mmol/L (ref 3.5–5.1)
Sodium: 143 mmol/L (ref 135–145)

## 2021-03-28 LAB — GLUCOSE, CAPILLARY: Glucose-Capillary: 157 mg/dL — ABNORMAL HIGH (ref 70–99)

## 2021-03-28 MED ORDER — IPRATROPIUM-ALBUTEROL 0.5-2.5 (3) MG/3ML IN SOLN: 3.0000 mL | Freq: Four times a day (QID) | RESPIRATORY_TRACT | Status: DC | PRN

## 2021-03-28 MED ORDER — LORAZEPAM 2 MG/ML IJ SOLN
1.0000 mg | Freq: Once | INTRAMUSCULAR | Status: AC
  Administered 2021-03-28: 1 mg via INTRAVENOUS
  Filled 2021-03-28: qty 1

## 2021-03-28 NOTE — Procedures (Signed)
Patient Name: [name]  MRN: [id number]  Epilepsy Attending: Lora Havens  Referring Physician/Provider: Dr Su Monks Date: 03/28/2021 Duration: 24.58 mins  Patient history: 80 year old with a history of diastolic CHF, CKD 3-4, hypertension, gout, COPD brought in for evaluation of episode of transient unresponsiveness with shaking while on the commode. EEG to evaluate for seizure  Level of alertness: Awake, asleep  AEDs during EEG study: None  Technical aspects: This EEG study was done with scalp electrodes positioned according to the 10-20 International system of electrode placement. Electrical activity was acquired at a sampling rate of 500Hz  and reviewed with a high frequency filter of 70Hz  and a low frequency filter of 1Hz . EEG data were recorded continuously and digitally stored.   Description: The posterior dominant rhythm consists of 8-9 Hz activity of moderate voltage (25-35 uV) seen predominantly in posterior head regions, symmetric and reactive to eye opening and eye closing.Sleep was characterized by vertex waves, sleep spindles (12 to 14 Hz), maximal frontocentral region.  EEG showed intermittent 5 to 6 Hz theta slowing in left frontal region. Single spike was  noted in left frontotemporal region.  Physiologic photic driving was not seen during photic stimulation.  Hyperventilation was not performed.     ABNORMALITY - Spike, left frontotemporal region. - Intermittent slow, left frontal region.  IMPRESSION: This study showed evidence of potential epileptogenicity arising from left frontotemporal region. There is also non-specific cortical dysfunction in left frontal region. No seizures were seen throughout the recording.  [name] [name]

## 2021-03-28 NOTE — Consult Note (Signed)
NEUROLOGY CONSULTATION NOTE   Date of service: Mar 28, 2021 Patient Name: [name] MRN:  [id number] DOB:  06-28-1941 Reason for consult: seizure vs convulsive syncope _ _ _   _ __   _ __ _ _  __ __   _ __   __ _  History of Present Illness   Pt unable to provide hx 2/2 dementia. Per H&P "MEGGIN OLA is a 80 y.o. female with medical history significant for chronic diastolic CHF last 2D echo done in 20s1 showed EF of 65 to 70% with grade 1 diastolic dysfunction, chronic kidney disease stage III - IV, hypertension, gout, COPD who was brought into the ER by family for evaluation after episode of transient unresponsiveness with shaking while on commode. Patient has dementia and is unable to provide any history.  Her daughter states that she and her brother alternate taking care of their mother and that her brother was taking care of the mother overnight.  They were concerned that patient had not voided all through the night because she is usually incontinent of urine and is on furosemide 80 mg daily.  Her brother walked the patient to the bathroom and while she was sitting on the commode he states that she suddenly had an episode that he describes as generalized shaking spell, transient unresponsiveness, tongue hanging out of her mouth and leaning towards her left side. According to the daughter this lasted about 15 seconds and then patient became more awake but did not respond to any verbal stimuli.  Her son tried to get her up but noticed that her left side was weak.  This improved after a few minutes and patient was able to ambulate back to her room using her Rollator. They decided to bring the patient to the ER to get evaluated for possible stroke. I am unable to do review of systems on this patient due to her underlying dementia. Show sodium 142, potassium 4.6, chloride 104, bicarb 25, glucose 1.6, BUN 24, creatinine 1.9, calcium 9.5, alkaline phosphatase 114, albumin 4.0, AST 35, ALT 16,  total protein 7.9, total bilirubin 1.3, white count 8.4, hemoglobin 12.7, hematocrit 39, MCV 95.9, RDW 15.7, platelet 208. Chest x-ray reviewed by me shows widening of the mediastinum, likely secondary to technical factors (rotation of the patient to the right), however ascending aortic aneurysmal changes could appear similarly. Consider chest CT for further evaluation in the appropriate clinical setting. Mild pulmonary edema. CT scan of the head without contrast shows  No acute finding. Chronic small vessel disease with chronic right thalamic lacunar infarct which has occurred since a 2021 comparison. Twelve-lead EKG reviewed by me shows sinus rhythm with PVCs."  Above hx confirmed with daughter. While patient being examined at bedside she is oriented to self only, and becomes agitated stating that the examiner, daughter, and patient all "have the devil in [place]" and we need to "get it out! Get it out!" She has had no further events since hospitalization.  CTH  1. No acute finding. 2. Chronic small vessel disease with chronic right thalamic lacunar infarct which has occurred since a 2021 comparison.  MRI brain too degraded by motion to interpret.   ROS   10 point review of systems was performed and was negative except as described in HPI.  Past History   Past Medical History:  Diagnosis Date  . Chronic venous insufficiency   . Colon, diverticulosis Sept. 2011   outpatient colonoscopy by Dr. Cristina Gong.  Need record  .  DDD (degenerative disc disease), lumbosacral   . Depression   . DJD (degenerative joint disease), multiple sites    Low back pain worst  . GERD (gastroesophageal reflux disease)   . Gout   . Gout    Uric Acid level 4.2 on 300 allopurinol  . History of cervical cancer January 30, 1971  . Hx-TIA (transient ischemic attack) 05/13/2001   Right facial numbness  . Hx-TIA (transient ischemic attack) 05/13/2001   Looking back in Epic, the patient had a hospitalization in July 2002 for  evaluation of questionable TIA (s/s of right facial numbness associated with mild blurred vision in her right eye, diaphoresis, and mild confusion) with negative CT head and negative MRI/MRA and was started on Plavix after being heparinized. She was to have possible follow up with Neurology for reevaluation of the need for Pl  . Hyperlipidemia   . Hypertension   . Keloid 08/01/2018  . Memory disorder 12/20/2016  . Mitral valve prolapse 10/27/1999   Subsequent 2D echo show normal mitral valve  . Osteoarthritis of hip    bilateral hips  . Ovarian cancer ([place]) 01-30-71   S/P oophorectomy  . Psychosis ([place])   . Recurrent boils    History of MRSA skin infections with abscess  . Sensorineural hearing loss of both ears   . Subdural hematoma ([place]) 01/26/2017   bilateral  . Uncomplicated opioid dependence ([place]) 01/31/2018   Past Surgical History:  Procedure Laterality Date  . ABDOMINAL HYSTERECTOMY     1972  . BURR HOLE Bilateral 02/12/2017   Procedure: Haskell Flirt;  Surgeon: Earnie Larsson, MD;  Location: [facility];  Service: Neurosurgery;  Laterality: Bilateral;  . CERVICAL DISCECTOMY  7/07   C5-C6  . JOINT REPLACEMENT     bilateral hip replacement  . LUMBAR DISC SURGERY     L5-S1 7/07  . LUMBAR LAMINECTOMY/DECOMPRESSION MICRODISCECTOMY N/A 02/02/2016   Procedure: L4-5 Decompression;  Surgeon: Marybelle Killings, MD;  Location: [place];  Service: Orthopedics;  Laterality: N/A;  . [place] for ovarian cancer   Family History  Problem Relation Age of Onset  . Diabetes Mother   . Heart disease Mother   . Hearing loss Mother   . Stroke Mother   . Post-traumatic stress disorder Father   . Schizophrenia Maternal Grandmother   . Diabetes Maternal Grandfather   . Other Brother        brother died in mental health hospital  . Kidney cancer Neg Hx   . Bladder Cancer Neg Hx    Social History   Socioeconomic History  . Marital status: Widowed    Spouse name: Not on file  . Number of children: 3   . Years of education: 31  . Highest education level: Not on file  Occupational History  . Occupation: Retired  Tobacco Use  . Smoking status: Former Smoker    Packs/day: 0.10    Years: 35.00    Pack years: 3.50    Types: Cigarettes    Quit date: 04/08/2013    Years since quitting: 7.9  . Smokeless tobacco: Never Used  . Tobacco comment: smoked a few when her son died in 01/29/13   Vaping Use  . Vaping Use: Never used  Substance and Sexual Activity  . Alcohol use: No    Alcohol/week: 0.0 standard drinks  . Drug use: No  . Sexual activity: Never  Other Topics Concern  . Not on file  Social History Narrative   Lives  with daughter in [place]. Daughter is primary caretaker, surrogate decision-maker, and arranges for [facility] aide and other caregivers to supervise Ms. Ballon at home. Ambulatory   Social Determinants of Health   Financial Resource Strain: Not on file  Food Insecurity: Not on file  Transportation Needs: No Transportation Needs  . Lack of Transportation (Medical): No  . Lack of Transportation (Non-Medical): No  Physical Activity: Not on file  Stress: Not on file  Social Connections: Not on file   Allergies  Allergen Reactions  . Aspirin Other (See Comments)    Bleeding from vagina   . Sulfamethoxazole-Trimethoprim Itching    Medications   Medications Prior to Admission  Medication Sig Dispense Refill Last Dose  . allopurinol (ZYLOPRIM) 300 MG tablet TAKE 1 TABLET BY MOUTH  DAILY (Patient taking differently: Take 300 mg by mouth daily.) 90 tablet 3 03/25/2021 at Unknown time  . amLODipine (NORVASC) 5 MG tablet Take 1 tablet (5 mg total) by mouth daily. 90 tablet 3 03/25/2021 at Unknown time  . ascorbic acid (VITAMIN C) 500 MG tablet Take 500 mg by mouth daily.    03/25/2021 at Unknown time  . atorvastatin (LIPITOR) 40 MG tablet TAKE 1 TABLET BY MOUTH  DAILY (Patient taking differently: Take 40 mg by mouth daily.) 90 tablet 3 03/25/2021 at Unknown time  . buPROPion  (WELLBUTRIN XL) 300 MG 24 hr tablet Take 1 tablet (300 mg total) by mouth every morning. 90 tablet 0 03/25/2021 at Unknown time  . carvedilol (COREG) 3.125 MG tablet Take 1 tablet (3.125 mg total) by mouth 2 (two) times daily with a meal. 180 tablet 3 03/25/2021 at Unknown time  . Cyanocobalamin (VITAMIN B12) 1000 MCG TBCR Take 1 tablet by mouth daily.   03/25/2021 at Unknown time  . donepezil (ARICEPT) 5 MG tablet Take 1 tablet (5 mg total) by mouth at bedtime. 30 tablet 0 03/25/2021 at Unknown time  . furosemide (LASIX) 80 MG tablet Take 80 mg by mouth daily.   03/25/2021 at Unknown time  . ketoconazole (NIZORAL) 2 % cream Apply 1 application topically 2 (two) times daily. 60 g 0 03/25/2021 at Unknown time  . losartan (COZAAR) 100 MG tablet Take 1 tablet (100 mg total) by mouth daily. 90 tablet 3 03/25/2021 at Unknown time  . nystatin (MYCOSTATIN/NYSTOP) powder Apply 1 application topically 3 (three) times daily. 60 g 0 03/25/2021 at Unknown time  . OLANZapine (ZYPREXA) 2.5 MG tablet Take 1 tab at bedtime (Patient taking differently: Take 2.5 mg by mouth at bedtime. Take 1 tab at bedtime) 30 tablet 1 03/25/2021 at Unknown time  . pantoprazole (PROTONIX) 40 MG tablet TAKE 1 TABLET BY MOUTH  TWICE DAILY BEFORE A MEAL (Patient taking differently: Take 40 mg by mouth 2 (two) times daily before a meal. TAKE 1 TABLET BY MOUTH  TWICE DAILY BEFORE A MEAL) 180 tablet 3 03/25/2021 at Unknown time  . acetaminophen (TYLENOL) 500 MG tablet Take 500 mg by mouth at bedtime.    unknown at prn  . albuterol (VENTOLIN HFA) 108 (90 Base) MCG/ACT inhaler Inhale 2 puffs into the lungs every 6 (six) hours as needed for wheezing or shortness of breath. 8 g 0 unknown at prn  . budesonide (PULMICORT) 0.25 MG/2ML nebulizer solution Take 2 mLs (0.25 mg total) by nebulization 2 (two) times daily. 120 mL 2 unknown at prn  . diclofenac Sodium (VOLTAREN) 1 % GEL Apply 4 g topically 4 (four) times daily as needed (for pain).  unknown at prn   . fluticasone (FLONASE) 50 MCG/ACT nasal spray Place into both nostrils as needed for allergies or rhinitis.   unknown at prn  . ipratropium-albuterol (DUONEB) 0.5-2.5 (3) MG/3ML SOLN Take 3 mLs by nebulization 2 (two) times daily. 360 mL 0 unknown at prn  . traMADol (ULTRAM) 50 MG tablet TAKE 1 TABLET BY MOUTH  TWICE DAILY AS NEEDED (Patient taking differently: Take 50 mg by mouth 2 (two) times daily. TAKE 1 TABLET BY MOUTH  TWICE DAILY AS NEEDED) 60 tablet 5 unknown at prn     Vitals   Vitals:   03/27/21 1511 03/27/21 1953 03/28/21 0522 03/28/21 0726  BP: 126/64 (!) 134/92 140/76 (!) 147/60  Pulse: 72 73 70 70  Resp: 20 14 14 19   Temp: 97.8 F (36.6 C) 98.8 F (37.1 C) 98.2 F (36.8 C) 98 F (36.7 C)  TempSrc:  Oral Oral   SpO2: 97% 97% 100% 97%  Weight:      Height:         Body mass index is 47.8 kg/m.  Physical Exam   Physical Exam Gen: alert, oriented to self only Resp: CTAB, no w/r/r CV: RRR, no m/g/r; nml S1 and S2. 2+ symmetric peripheral pulses.=  Neuro: *MS: alert, oriented to self only, does not follow simple commands *Speech: mild dysarthria, no apparent aphasia but will not cooperate to name or repeat *CN: PERRL, blinks to threat bilat, tracks examiner with apparent EOMI, sensation intact, face symmetric, hearing intact to voice *Motor: moves all extremities spontaneously at least anti-gravity but not to command *Sensory: SILT *Reflexes: 2+ and symmetric throughout, toes equiv bilat   Labs   CBC:  Recent Labs  Lab 03/26/21 0813 03/27/21 0535  WBC 8.4 7.2  HGB 12.7 12.0  HCT 39.0 36.0  MCV 95.1 93.5  PLT 208 166    Basic Metabolic Panel:  Lab Results  Component Value Date   NA 142 03/27/2021   K 3.3 (L) 03/27/2021   CO2 26 03/27/2021   GLUCOSE 117 (H) 03/27/2021   BUN 22 03/27/2021   CREATININE 1.41 (H) 03/27/2021   CALCIUM 9.0 03/27/2021   GFRNONAA 38 (L) 03/27/2021   GFRAA 29 01/25/2021   Lipid Panel:  Lab Results  Component  Value Date   LDLCALC 126 (H) 03/27/2021   HgbA1c:  Lab Results  Component Value Date   HGBA1C 7.0 (H) 02/28/2021   Urine Drug Screen:     Component Value Date/Time   LABOPIA NONE DETECTED 12/03/2018 1502   COCAINSCRNUR NONE DETECTED 12/03/2018 1502   LABBENZ NONE DETECTED 12/03/2018 1502   AMPHETMU NONE DETECTED 12/03/2018 1502   THCU NONE DETECTED 12/03/2018 1502   LABBARB NONE DETECTED 12/03/2018 1502    Alcohol Level     Component Value Date/Time   ETH <10 12/03/2018 1214     Impression   80 y.o. female with medical history significant for chronic diastolic CHF last 2D echo done in 20s1 showed EF of 65 to 70% with grade 1 diastolic dysfunction, chronic kidney disease stage III - IV, hypertension, gout, COPD who was brought into the ER by family for evaluation after episode of transient unresponsiveness with shaking while on commode c/f seizure vs convulsive syncope. Presentation would be atypical for TIA/CVA given no new focla neurologic deficits and rapid recovery of consciousness. Daughter states patient was particularly agitated prior to MRI but has been able to tolerate MRI in recent past so I think it is reasonable to reorder it.  Will also order EEG. Hold off on AEDs for now unless new abnl on MRI brain (beyond remote subcortical infarct) or epileptiform abnl on EEG.  Recommendations   - No indication to start AED at this time pending further w/u - Will reorder brain MRI - rEEG  Will continue to follow  ______________________________________________________________________   Thank you for the opportunity to take part in the care of this patient. If you have any further questions, please contact the neurology consultation attending.  Signed,  Su Monks, MD [facility] [phone]  If 7pm- 7am, please page neurology on call as listed in [place].

## 2021-03-28 NOTE — Progress Notes (Addendum)
PROGRESS NOTE    [name]  PPI:[id number] DOB: January 31, 1941 DOA: 03/26/2021 PCP: Vivi Barrack, MD  Outpatient Specialists: cardiology    Brief Narrative:   [name] is a 80 y.o. female with medical history significant for chronic diastolic CHF last 2D echo done in 20s1 showed EF of 65 to 70% with grade 1 diastolic dysfunction, chronic kidney disease stage III - IV, hypertension, gout, COPD who was brought into the ER by family for evaluation. Patient has dementia and is unable to provide any history.  Her daughter states that she and her brother alternate taking care of their mother and that her brother was taking care of the mother overnight.  They were concerned that patient had not voided all through the night because she is usually incontinent of urine and is on furosemide 80 mg daily.  Her brother walked the patient to the bathroom and while she was sitting on the commode he states that she suddenly had an episode that he describes as generalized shaking spell, transient unresponsiveness, tongue hanging out of her mouth and leaning towards her left side. According to the daughter this lasted about 15 seconds and then patient became more awake but did not respond to any verbal stimuli.  Her son tried to get her up but noticed that her left side was weak.  This improved after a few minutes and patient was able to ambulate back to her room using her Rollator. They decided to bring the patient to the ER to get evaluated for possible stroke.   Assessment & Plan:   Principal Problem:   TIA (transient ischemic attack) Active Problems:   GERD   Schizophrenia, chronic condition (HCC)   Acute on chronic diastolic CHF (congestive heart failure) (HCC)   Obesity, Class III, BMI 40-49.9 (morbid obesity) ([place])   Dementia ([place])   # Questionable TIA #Seizure-like activity With signs possible seizure. Brief episode of left sided weakness at home with preceding possible loc  preceded by convulsive activity. Appears to be back to baseline. Underlying dementia. CT shows evidence prior stroke. MRI degraded by motion, no acute abnormalities seen. Non-focal neuro exam currently. pvcs on tele. - cont home atorvastatin. Not on aspirin for hx of bleeding with aspirin - pt/ot consulted - neurology consulted - attempt to repeat MRI today, also EEG - possible d/c this afternoon  # Diastolic heart vailure Appears compensated - home lasix  # HTN Here bp wnl - cont home amlod, coreg, losartan, lasix  # Widened mediastinum On cxr. Radiology thinks likely positional, aneurysm on ddx. CT with tortuous aorta, no other abnormality  # ckd 3 Kidney function at baseline  # Hypokalemia resolved  # Dementia with depression - Continue donepezil, Wellbutrin and Zyprexa  # COPD Not acutely exacerbated - home pulmicort prn   DVT prophylaxis: lovenox Code Status: full Family Communication: daughter updated @ bedside 5/30  Level of care: Progressive Cardiac Status is: Observation  Pt will be here at least 2 midnights. I communicated with the UR nurse who advises keeping the patient obs.  Dispo: The patient is from: Home              Anticipated d/c is to: Home with home health pt/ot/rn              Patient currently is not medically stable to d/c.   Difficult to place patient No        Consultants:  neurology  Procedures: none  Antimicrobials:  none  Subjective: This morning up in bed, alert, no complaints. Tolerating diet.  Objective: Vitals:   03/28/21 0522 03/28/21 0726 03/28/21 0848 03/28/21 0930  BP: 140/76 (!) 147/60    Pulse: 70 70 77 77  Resp: 14 19 16    Temp: 98.2 F (36.8 C) 98 F (36.7 C)    TempSrc: Oral     SpO2: 100% 97% 94% 96%  Weight:      Height:        Intake/Output Summary (Last 24 hours) at 03/28/2021 1102 Last data filed at 03/28/2021 1001 Gross per 24 hour  Intake 240 ml  Output 1100 ml  Net -860 ml   Filed  Weights   03/26/21 0808 03/26/21 1840 03/27/21 0727  Weight: 120.2 kg 124.1 kg 122.4 kg    Examination:  General exam: Appears calm and comfortable  Respiratory system: Clear to auscultation. Respiratory effort normal. Cardiovascular system: S1 & S2 heard, RRR. No JVD, murmurs, rubs, gallops or clicks. No pedal edema. Gastrointestinal system: Abdomen is obese , soft and nontender. No organomegaly or masses felt. Normal bowel sounds heard. Central nervous system: Alert and oriented. Moving all 4 extremities. Cn 2-12 grossly intact. 5/5 strength Extremities: trace LE edema Skin: No rashes, lesions or ulcers Psychiatry: confused, alert     Data Reviewed: I have personally reviewed following labs and imaging studies  CBC: Recent Labs  Lab 03/26/21 0813 03/27/21 0535  WBC 8.4 7.2  HGB 12.7 12.0  HCT 39.0 36.0  MCV 95.1 93.5  PLT 208 902   Basic Metabolic Panel: Recent Labs  Lab 03/26/21 0813 03/27/21 0535 03/28/21 0708  NA 142 142 143  K 4.6 3.3* 3.8  CL 104 106 107  CO2 25 26 26   GLUCOSE 146* 117* 130*  BUN 24* 22 21  CREATININE 1.99* 1.41* 1.42*  CALCIUM 9.5 9.0 9.3  MG  --  1.7  --    GFR: Estimated Creatinine Clearance: 40.8 mL/min (A) (by C-G formula based on SCr of 1.42 mg/dL (H)). Liver Function Tests: Recent Labs  Lab 03/26/21 0813  AST 35  ALT 16  ALKPHOS 114  BILITOT 1.3*  PROT 7.9  ALBUMIN 4.0   No results for input(s): LIPASE, AMYLASE in the last 168 hours. No results for input(s): AMMONIA in the last 168 hours. Coagulation Profile: No results for input(s): INR, PROTIME in the last 168 hours. Cardiac Enzymes: No results for input(s): CKTOTAL, CKMB, CKMBINDEX, TROPONINI in the last 168 hours. BNP (last 3 results) No results for input(s): PROBNP in the last 8760 hours. HbA1C: No results for input(s): HGBA1C in the last 72 hours. CBG: Recent Labs  Lab 03/26/21 0807 03/27/21 1125  GLUCAP 146* 161*   Lipid Profile: Recent Labs     03/27/21 0535  CHOL 189  HDL 36*  LDLCALC 126*  TRIG 134  CHOLHDL 5.3   Thyroid Function Tests: No results for input(s): TSH, T4TOTAL, FREET4, T3FREE, THYROIDAB in the last 72 hours. Anemia Panel: No results for input(s): VITAMINB12, FOLATE, FERRITIN, TIBC, IRON, RETICCTPCT in the last 72 hours. Urine analysis:    Component Value Date/Time   COLORURINE YELLOW (A) 03/26/2021 1155   APPEARANCEUR HAZY (A) 03/26/2021 1155   APPEARANCEUR Clear 05/07/2017 1614   LABSPEC 1.014 03/26/2021 1155   PHURINE 5.0 03/26/2021 1155   GLUCOSEU NEGATIVE 03/26/2021 [place] 07/31/2017 1511   HGBUR NEGATIVE 03/26/2021 1155   HGBUR trace-intact 11/28/2007 1531   BILIRUBINUR NEGATIVE 03/26/2021 1155   BILIRUBINUR Negative 09/01/2019 1532  BILIRUBINUR Negative 05/07/2017 [place] 03/26/2021 1155   PROTEINUR 30 (A) 03/26/2021 1155   UROBILINOGEN 0.2 09/01/2019 1532   UROBILINOGEN 0.2 11/04/2017 1904   NITRITE NEGATIVE 03/26/2021 [place] 03/26/2021 1155   Sepsis Labs: @LABRCNTIP (procalcitonin:4,lacticidven:4)  ) Recent Results (from the past 240 hour(s))  SARS CORONAVIRUS 2 (TAT 6-24 HRS) Nasopharyngeal Nasopharyngeal Swab     Status: None   Collection Time: 03/26/21 11:55 AM   Specimen: Nasopharyngeal Swab  Result Value Ref Range Status   SARS Coronavirus 2 NEGATIVE NEGATIVE Final    Comment: (NOTE) SARS-CoV-2 target nucleic acids are NOT DETECTED.  The SARS-CoV-2 RNA is generally detectable in upper and lower respiratory specimens during the acute phase of infection. Negative results do not preclude SARS-CoV-2 infection, do not rule out co-infections with other pathogens, and should not be used as the sole basis for treatment or other patient management decisions. Negative results must be combined with clinical observations, patient history, and epidemiological information. The expected result is Negative.  Fact Sheet for  Patients: SugarRoll.be  Fact Sheet for Healthcare Providers: https://www.woods-mathews.com/  This test is not yet approved or cleared by the [place] FDA and  has been authorized for detection and/or diagnosis of SARS-CoV-2 by FDA under an Emergency Use Authorization (EUA). This EUA will remain  in effect (meaning this test can be used) for the duration of the COVID-19 declaration under Se ction 564(b)(1) of the Act, 21 U.S.C. section 360bbb-3(b)(1), unless the authorization is terminated or revoked sooner.  Performed at [facility], [place] [address] [place], [place] [address]          Radiology Studies: CT CHEST WO CONTRAST  Result Date: 03/27/2021 CLINICAL DATA:  Abnormal mediastinal widening by x-ray. EXAM: CT CHEST WITHOUT CONTRAST TECHNIQUE: Multidetector CT imaging of the chest was performed following the standard protocol without IV contrast. COMPARISON:  Radiography from yesterday FINDINGS: Cardiovascular: Normal heart size. Aortic atherosclerosis and tortuosity which correlates with the radiographic appearance. Negative for aneurysm or visible intramural hematoma. Coronary atherosclerotic calcification. Mediastinum/Nodes: No adenopathy or mass. Lungs/Pleura: Paraseptal emphysema at the apices. There is no edema, consolidation, effusion, or pneumothorax. Upper Abdomen: Cystic density at the upper pole right kidney. Musculoskeletal: Spondylosis with generalized bridging osteophytes. IMPRESSION: The radiographic abnormality correlates with aortic tortuosity. No aneurysm, adenopathy, mediastinal hematoma. Aortic and coronary atherosclerosis. Electronically Signed   By: Monte Fantasia M.D.   On: 03/27/2021 08:53   MR BRAIN WO CONTRAST  Result Date: 03/26/2021 CLINICAL DATA:  TIA. EXAM: MRI HEAD WITHOUT CONTRAST TECHNIQUE: Multiplanar, multiecho pulse sequences of the brain and surrounding structures were obtained without  intravenous contrast. COMPARISON:  CT head Mar 26, 2021. FINDINGS: Incomplete and motion limited exam with only DWI/ADC sequences performed. No evidence of an obvious/large acute infarct. IMPRESSION: Incomplete and motion limited exam with only DWI/ADC sequences performed. No evidence of an obvious/large acute infarct. If the patient is able, a repeat MRI could provide more sensitive/complete evaluation. Electronically Signed   By: Margaretha Sheffield MD   On: 03/26/2021 14:18        Scheduled Meds: .  stroke: mapping our early stages of recovery book   Does not apply Once  . acetaminophen  500 mg Oral QHS  . allopurinol  200 mg Oral Daily  . ascorbic acid  500 mg Oral Daily  . atorvastatin  40 mg Oral Daily  . buPROPion  300 mg Oral q morning  . carvedilol  3.125 mg  Oral BID WC  . donepezil  5 mg Oral QHS  . enoxaparin (LOVENOX) injection  0.5 mg/kg Subcutaneous Q24H  . furosemide  80 mg Oral Daily  . ipratropium-albuterol  3 mL Nebulization BID  . losartan  100 mg Oral Daily  . OLANZapine  2.5 mg Oral QHS  . pantoprazole  40 mg Oral BID AC  . sodium chloride flush  3 mL Intravenous Q12H  . traMADol  50 mg Oral Daily  . vitamin B-12  1,000 mcg Oral Daily   Continuous Infusions: . sodium chloride       LOS: 0 days    Time spent: 25 min    Desma Maxim, MD [facility]   If 7PM-7AM, please contact night-coverage [email or web address] Password [email or web address] 03/28/2021, 11:02 AM

## 2021-03-28 NOTE — Progress Notes (Addendum)
SLP Cancellation Note  Patient Details Name: [name] MRN: [id number] DOB: 05/20/1941   Cancelled treatment:       Reason Eval/Treat Not Completed: SLP screened, no needs identified, will sign off (chart reviewed;  discussed w/ Daughter, NSG) Met w/ Dtr and discussed pt's current presentation. Pt has a Baseline of Dementia w/ mildly increased confusion and anxiety, moreso last evening when she also felt cold in the room and Dtr was not present. Dtr endorsed pt was calm this morning and conversing w/ her earlier when she arrived but now is sleeping(d/t not resting much last evening).  Dtr stated she felt "fine" about pt's current communication; pt knew who Dtr was/name, she indicated her basic wants/needs, speech is clear. Pt requires verbal cues for follow through w/ instructions here and at home w/ family. Encouraged Daughter to reduce distractions in the room/environment (when going home too) to reduce Confusion d/t the Dementia. Routine is key.  Dtr denied any overt s/s of aspiration w/ meals/po's so far "except when she was not sitting up when drinking via straw(in the bed)". Discussed general aspiration precautions. Will return at Lunch meal to given handout and to assist in sitting pt Upright for meal, and for instruction w/ Dtr. Dtr expressed appreciation. No reports of swallowing issues per NSG report.  No acute needs indicated as pt appears at her Baseline (w/ Cognitive decline) per pt's Dtr and NSG. Daughter agreed. ST services will sign off at this time w/ MD to reconsult if new needs arise while admitted.       Shelby Kenner, MS, CCC-SLP Speech Language Pathologist Rehab Services [phone] [facility] 03/28/2021, 9:12 AM

## 2021-03-28 NOTE — TOC Initial Note (Addendum)
Transition of Care [facility]) - Initial/Assessment Note    Patient Details  Name: [name] MRN: [id number] Date of Birth: 01/21/1941  [facility] CM/SW Contact:    Alberteen Sam, LCSW Phone Number: 03/28/2021, 9:35 AM  Clinical Narrative:                  Update 10:36am: Corene Cornea with [facility] has accepted patient for PT, OT, RN and aide. CSW will continue to follow for any further discharge planning needs.     CSW spoke with patient's daughter regarding home health services, as patient has Dementia. Daughter [name] reports hx of home health services however has no preference at this time.   Is agreeable to PT, OT, RN and aide for support at home for patient. Confirms patient lives in [place]. Patient's daughter reports no noted DME needs at this time as patient has a walk in tub and toilet set up for easy access.   CSW has sent Home Health referrals pending acceptance at this time.   Expected Discharge Plan: [place] Barriers to Discharge: Continued Medical Work up   Patient Goals and CMS Choice Patient states their goals for this hospitalization and ongoing recovery are:: to go home CMS Medicare.gov Compare Post Acute Care list provided to:: Patient Represenative (must comment) (daughter [name]) Choice offered to / list presented to : Adult Children  Expected Discharge Plan and Services Expected Discharge Plan: [place] Choice: [place] arrangements for the past 2 months: Single Family Home                           HH Arranged: PT,OT,RN,Nurse's Aide   Date HH Agency Contacted: 03/28/21 Time [place]: [place]    Prior Living Arrangements/Services Living arrangements for the past 2 months: [place] with:: Self Patient language and need for interpreter reviewed:: Yes Do you feel safe going back to the place where you live?: Yes      Need for  Family Participation in Patient Care: Yes (Comment) Care giver support system in place?: Yes (comment)   Criminal Activity/Legal Involvement Pertinent to Current Situation/Hospitalization: No - Comment as needed  Activities of Daily Living Home Assistive Devices/Equipment: Bedside commode/3-in-1,Walker (specify type) ADL Screening (condition at time of admission) Patient's cognitive ability adequate to safely complete daily activities?: No Is the patient deaf or have difficulty hearing?: Yes Does the patient have difficulty seeing, even when wearing glasses/contacts?: No Does the patient have difficulty concentrating, remembering, or making decisions?: Yes Patient able to express need for assistance with ADLs?: Yes Does the patient have difficulty dressing or bathing?: Yes Independently performs ADLs?: Yes (appropriate for developmental age) Does the patient have difficulty walking or climbing stairs?: Yes Weakness of Legs: Both Weakness of Arms/Hands: Both  Permission Sought/Granted Permission sought to share information with : Case Manager,Facility Contact Representative,Family Supports Permission granted to share information with : Yes, Verbal Permission Granted  Share Information with NAME: [name]  Permission granted to share info w AGENCY: [place] granted to share info w Relationship: daughter  Permission granted to share info w Contact Information: [phone]  Emotional Assessment Appearance:: Appears stated age     Orientation: : Oriented to Self Alcohol / Substance Use: Not Applicable Psych Involvement: No (comment)  Admission diagnosis:  TIA (transient ischemic attack) [G45.9] Acute pulmonary edema ([place]) [  J81.0] Acute on chronic diastolic CHF (congestive heart failure) (HCC) [I50.33] Acute on chronic congestive heart failure, unspecified heart failure type [facility]) [I50.9] Patient Active Problem List   Diagnosis Date Noted  . Acute on chronic diastolic  CHF (congestive heart failure) ([place]) 03/26/2021  . TIA (transient ischemic attack) 03/26/2021  . Obesity, Class III, BMI 40-49.9 (morbid obesity) ([place]) 03/26/2021  . Dementia ([place]) 03/26/2021  . Debility 11/06/2019  . Venous stasis of lower extremity 10/14/2019  . Hyperglycemia 05/06/2018  . Constipation 04/30/2018  . Osteoarthritis 01/31/2018  . Lower extremity edema 07/31/2017  . History of bilateral hip replacements   . Memory disorder 12/20/2016  . History of lumbar laminectomy for spinal cord decompression 02/02/2016  . Atopic dermatitis 07/09/2015  . Reactive airway disease 08/11/2014  . History of cancer 08/11/2014  . OSA (obstructive sleep apnea) 08/11/2014  . Chronic kidney disease (CKD), stage III (moderate) ([place]) 10/04/2012  . Schizophrenia, chronic condition ([place]) 01/20/2012  . Postmenopausal atrophic vaginitis 11/01/2010  . Chronic diastolic heart failure ([place]) 05/06/2010  . Allergic rhinitis 10/14/2009  . Obesity, Class II, BMI 35-39.9, with comorbidity 05/14/2009  . Chronic prescription opiate use 04/29/2008  . Normocytic anemia 09/25/2007  . Hyperlipidemia 09/03/2006  . Gout 09/03/2006  . Essential hypertension 09/03/2006  . GERD 09/03/2006   PCP:  Vivi Barrack, MD Pharmacy:   [facility], [address], [address] [place], [place] [address] Phone: [phone] Fax: [facility] [address] [place]), [place] - [address] [place] ([place]) [place] [address] Phone: [phone] Fax: [phone]     Social Determinants of Health (SDOH) Interventions    Readmission Risk Interventions No flowsheet data found.

## 2021-03-28 NOTE — Progress Notes (Signed)
eeg done °

## 2021-03-28 NOTE — Plan of Care (Signed)
  Problem: Health Behavior/Discharge Planning: Goal: Ability to manage health-related needs will improve Outcome: Progressing   Problem: Clinical Measurements: Goal: Ability to maintain clinical measurements within normal limits will improve Outcome: Progressing   

## 2021-03-28 NOTE — Progress Notes (Addendum)
Neurology Progress Note HPI from initial consultation note Pt unable to provide hx 2/2 dementia. Per H&P "Shelby Maris McKinneyis a 80 y.o.femalewith medical history significant forchronicdiastolic CHF last 2D echo done in 20s1showed EF of 65 to 70% with grade 1 diastolic dysfunction,chronic kidney disease stage III - IV, hypertension, gout,COPD who was brought into the ER by family for evaluation after episode of transient unresponsiveness with shaking while on commode. Patient has dementia and is unable to provide any history.Her daughter states that she and her brother alternate taking care of their mother and that her brother was taking care of the mother overnight. Theywere concerned that patient had not voided all through the night because she is usually incontinent of urine and is on furosemide 80 mg daily. Her brother walked the patient to the bathroom and while she was sitting on the commode he states that she suddenly had an episode that he describes as generalized shaking spell, transient unresponsiveness, tongue hanging out of her mouth and leaning towards her left side. According to the daughter this lasted about 15 seconds and then patient became more awake but did not respond to any verbal stimuli.Her son tried to get her up but noticed that her left side was weak.This improved after a few minutes and patient was able to ambulate back to her room using her Rollator. They decided to bring the patient to the ER to get evaluated for possible stroke. I am unable to do review of systems on this patient due to her underlying dementia. Show sodium 142, potassium 4.6, chloride 104, bicarb 25, glucose 1.6, BUN 24, creatinine 1.9, calcium 9.5, alkaline phosphatase 114, albumin 4.0, AST 35, ALT 16, total protein 7.9, total bilirubin 1.3, white count 8.4, hemoglobin 12.7, hematocrit 39, MCV 95.9, RDW 15.7, platelet 208. Chest x-ray reviewed by me showswidening of the mediastinum, likely  secondary to technical factors (rotation of the patient to the right), however ascending aortic aneurysmal changes could appear similarly. Consider chest CT for further evaluation in the appropriate clinical setting. Mild pulmonary edema. CT scan of the head without contrast shows No acute finding. Chronic small vessel disease with chronic right thalamic lacunar infarct which has occurred since a 2021 comparison. Twelve-lead EKG reviewed by me shows sinus rhythm with PVCs."  Above hx confirmed with daughter. While patient being examined at bedside she is oriented to self only, and becomes agitated stating that the examiner, daughter, and patient all "have the devil in [place]" and we need to "get it out! Get it out!" She has had no further events since hospitalization.  S:// No acute events overnight.  Daughter reports that she is sleeping peacefully   O:// Current vital signs: BP (!) 118/43 (BP Location: Right Arm)   Pulse 70   Temp 98 F (36.7 C)   Resp 19   Ht 5\' 3"  (1.6 m)   Wt 122.8 kg   SpO2 98%   BMI 47.96 kg/m  Vital signs in last 24 hours: Temp:  [97.8 F (36.6 C)-98.8 F (37.1 C)] 98 F (36.7 C) (05/30 1123) Pulse Rate:  [70-77] 70 (05/30 1123) Resp:  [14-20] 19 (05/30 1123) BP: (118-147)/(43-92) 118/43 (05/30 1123) SpO2:  [94 %-100 %] 98 % (05/30 1123) Weight:  [122.8 kg] 122.8 kg (05/30 0930) General: Comfortably sleeping, awakens to voice. HEENT: Normocephalic atraumatic, edentulous Lungs: Clear Cardiovascular: Regular rate rhythm Extremities warm well perfused Neurological exam Awake alert oriented to self. Does not follow simple commands consistently.  The daughter had to mimic  her to get her to raise her arms which she did keep up but only on daughter's instructions and not mine. She has mild dysarthria Difficult assess for aphasia but it does seem like she has both receptive and expressive aphasia. Cranial nerves: Pupils equal round reactive to light,  extraocular movements appear intact as observed when she was tracking me from side to side, blinks to threat from both sides, face appears symmetric, tongue and palate midline. Motor exam: Moves all 4 spontaneously antigravity and is able to hold both upper extremities against gravity for 10 seconds upon daughter's mimicking. Sensation intact to touch Coordination difficult to assess due to her poor attention concentration   Medications  Current Facility-Administered Medications:  .   stroke: mapping our early stages of recovery book, , Does not apply, Once, Agbata, Tochukwu, MD .  0.9 %  sodium chloride infusion, 250 mL, Intravenous, PRN, Agbata, Tochukwu, MD .  acetaminophen (TYLENOL) tablet 500 mg, 500 mg, Oral, QHS, Agbata, Tochukwu, MD, 500 mg at 03/27/21 2022 .  allopurinol (ZYLOPRIM) tablet 200 mg, 200 mg, Oral, Daily, Agbata, Tochukwu, MD, 200 mg at 03/28/21 1125 .  ascorbic acid (VITAMIN C) tablet 500 mg, 500 mg, Oral, Daily, Agbata, Tochukwu, MD, 500 mg at 03/28/21 1126 .  atorvastatin (LIPITOR) tablet 40 mg, 40 mg, Oral, Daily, Agbata, Tochukwu, MD, 40 mg at 03/28/21 1126 .  budesonide (PULMICORT) nebulizer solution 0.5 mg, 0.5 mg, Nebulization, BID PRN, Wouk, Ailene Rud, MD .  buPROPion (WELLBUTRIN XL) 24 hr tablet 300 mg, 300 mg, Oral, q morning, Agbata, Tochukwu, MD, 300 mg at 03/28/21 1126 .  carvedilol (COREG) tablet 3.125 mg, 3.125 mg, Oral, BID WC, Agbata, Tochukwu, MD, 3.125 mg at 03/28/21 1126 .  donepezil (ARICEPT) tablet 5 mg, 5 mg, Oral, QHS, Agbata, Tochukwu, MD, 5 mg at 03/27/21 2022 .  enoxaparin (LOVENOX) injection 60 mg, 0.5 mg/kg, Subcutaneous, Q24H, Wouk, Ailene Rud, MD, 60 mg at 03/28/21 1127 .  furosemide (LASIX) tablet 80 mg, 80 mg, Oral, Daily, Wouk, Ailene Rud, MD, 80 mg at 03/28/21 1125 .  ipratropium-albuterol (DUONEB) 0.5-2.5 (3) MG/3ML nebulizer solution 3 mL, 3 mL, Nebulization, BID, Agbata, Tochukwu, MD, 3 mL at 03/28/21 0848 .  LORazepam (ATIVAN)  injection 1 mg, 1 mg, Intravenous, Once, Wouk, Ailene Rud, MD .  losartan (COZAAR) tablet 100 mg, 100 mg, Oral, Daily, Agbata, Tochukwu, MD, 100 mg at 03/28/21 1125 .  OLANZapine (ZYPREXA) tablet 2.5 mg, 2.5 mg, Oral, QHS, Agbata, Tochukwu, MD, 2.5 mg at 03/27/21 2022 .  ondansetron (ZOFRAN) tablet 4 mg, 4 mg, Oral, Q6H PRN **OR** ondansetron (ZOFRAN) injection 4 mg, 4 mg, Intravenous, Q6H PRN, Agbata, Tochukwu, MD .  pantoprazole (PROTONIX) EC tablet 40 mg, 40 mg, Oral, BID AC, Agbata, Tochukwu, MD, 40 mg at 03/28/21 1126 .  sodium chloride flush (NS) 0.9 % injection 3 mL, 3 mL, Intravenous, Q12H, Agbata, Tochukwu, MD, 3 mL at 03/28/21 1128 .  sodium chloride flush (NS) 0.9 % injection 3 mL, 3 mL, Intravenous, PRN, Agbata, Tochukwu, MD .  traMADol (ULTRAM) tablet 50 mg, 50 mg, Oral, Daily, Wouk, Ailene Rud, MD, 50 mg at 03/28/21 1127 .  vitamin B-12 (CYANOCOBALAMIN) tablet 1,000 mcg, 1,000 mcg, Oral, Daily, Agbata, Tochukwu, MD, 1,000 mcg at 03/28/21 1126 Labs CBC    Component Value Date/Time   WBC 7.2 03/27/2021 0535   RBC 3.85 (L) 03/27/2021 0535   HGB 12.0 03/27/2021 0535   HGB 12.2 06/07/2017 1014   HCT 36.0 03/27/2021 0535  HCT 38.1 06/07/2017 1014   PLT 194 03/27/2021 0535   PLT 230 06/07/2017 1014   MCV 93.5 03/27/2021 0535   MCV 95 06/07/2017 1014   MCH 31.2 03/27/2021 0535   MCHC 33.3 03/27/2021 0535   RDW 15.6 (H) 03/27/2021 0535   RDW 17.4 (H) 06/07/2017 1014   LYMPHSABS 1.7 09/22/2020 1747   MONOABS 0.6 09/22/2020 1747   EOSABS 0.2 09/22/2020 1747   BASOSABS 0.0 09/22/2020 1747    CMP     Component Value Date/Time   NA 143 03/28/2021 0708   NA 144 01/25/2021 0000   K 3.8 03/28/2021 0708   CL 107 03/28/2021 0708   CO2 26 03/28/2021 0708   GLUCOSE 130 (H) 03/28/2021 0708   BUN 21 03/28/2021 0708   BUN 18 01/25/2021 0000   CREATININE 1.42 (H) 03/28/2021 0708   CREATININE 1.43 (H) 09/22/2020 1528   CALCIUM 9.3 03/28/2021 0708   PROT 7.9 03/26/2021 0813    ALBUMIN 4.0 03/26/2021 0813   AST 35 03/26/2021 0813   ALT 16 03/26/2021 0813   ALKPHOS 114 03/26/2021 0813   BILITOT 1.3 (H) 03/26/2021 0813   GFRNONAA 38 (L) 03/28/2021 0708   GFRNONAA 40 (L) 05/19/2014 1136   GFRAA 29 01/25/2021 0000   GFRAA 46 (L) 05/19/2014 1136   Imaging I have reviewed images in epic and the results pertinent to this consultation are: CT head with no acute changes, chronic small vessel disease with chronic right thalamic lacunar infarction which has happened since the 2021 comparison.  MRI brain-extremely motion greater than nondiagnostic.  Assessment:  80 year old with a history of diastolic CHF, CKD 3-4, hypertension, gout, COPD brought in for evaluation of episode of transient unresponsiveness with shaking while on the commode. Differentials seizures versus convulsive syncope. Very atypical for a TIA-do not suspect this is a TIA. Nondiagnostic MRI due to motion. No AEDs initiated.  Impression: Evaluate for seizure versus convulsive syncope.  Recommendations:  EEG-ordered-technologist notified.  Attempted to the MRI after the EEG.  Okay to use some Ativan for sedation if needed.  Agree with the initial recs of no AED for now.  If the EEG or MRI show concerning findings, would initiate AEDs at that time.  Outpatient sleep study - given body habitus, high risk for OSA and sleep disorders Plan discussed with Dr. Glori Luis via secure chat. Neurology will follow.   -- Amie Portland, MD Neurologist [facility] Pager: [phone]  ADDENDUM EEG ABNORMALITY - Spike, left frontotemporal region. - Intermittent slow, left frontal region. IMPRESSION: This study showed evidence of potential epileptogenicity arising from left frontotemporal region. There is also non-specific cortical dysfunction in left frontal region. No seizures were seen throughout the recording.   Given the abnormal EEG, it is imperitive we look at the MRI to r/o structural  abnormality. If there is underlying stroke in the region-it might be the reason for EEG findings but if there is no stroke, then we will treat it like epilepsy and treat with AEDs.  Communicated with Dr. Si Raider. Patient did not tolerate MRI again this evening. We will attempt in the AM again and then decide on AEDs.  -- Amie Portland, MD Neurologist [facility] Pager: [phone]

## 2021-03-29 ENCOUNTER — Inpatient Hospital Stay: Payer: Medicare Other

## 2021-03-29 DIAGNOSIS — H903 Sensorineural hearing loss, bilateral: Secondary | ICD-10-CM | POA: Diagnosis present

## 2021-03-29 DIAGNOSIS — R531 Weakness: Secondary | ICD-10-CM | POA: Diagnosis present

## 2021-03-29 DIAGNOSIS — E876 Hypokalemia: Secondary | ICD-10-CM | POA: Diagnosis not present

## 2021-03-29 DIAGNOSIS — Z6841 Body Mass Index (BMI) 40.0 and over, adult: Secondary | ICD-10-CM | POA: Diagnosis not present

## 2021-03-29 DIAGNOSIS — N1832 Chronic kidney disease, stage 3b: Secondary | ICD-10-CM | POA: Diagnosis present

## 2021-03-29 DIAGNOSIS — T502X5A Adverse effect of carbonic-anhydrase inhibitors, benzothiadiazides and other diuretics, initial encounter: Secondary | ICD-10-CM | POA: Diagnosis not present

## 2021-03-29 DIAGNOSIS — F209 Schizophrenia, unspecified: Secondary | ICD-10-CM | POA: Diagnosis present

## 2021-03-29 DIAGNOSIS — I5033 Acute on chronic diastolic (congestive) heart failure: Secondary | ICD-10-CM | POA: Diagnosis present

## 2021-03-29 DIAGNOSIS — J438 Other emphysema: Secondary | ICD-10-CM | POA: Diagnosis present

## 2021-03-29 DIAGNOSIS — R569 Unspecified convulsions: Secondary | ICD-10-CM | POA: Diagnosis present

## 2021-03-29 DIAGNOSIS — Z8673 Personal history of transient ischemic attack (TIA), and cerebral infarction without residual deficits: Secondary | ICD-10-CM | POA: Diagnosis not present

## 2021-03-29 DIAGNOSIS — M109 Gout, unspecified: Secondary | ICD-10-CM | POA: Diagnosis present

## 2021-03-29 DIAGNOSIS — I872 Venous insufficiency (chronic) (peripheral): Secondary | ICD-10-CM | POA: Diagnosis present

## 2021-03-29 DIAGNOSIS — I739 Peripheral vascular disease, unspecified: Secondary | ICD-10-CM | POA: Diagnosis present

## 2021-03-29 DIAGNOSIS — E785 Hyperlipidemia, unspecified: Secondary | ICD-10-CM | POA: Diagnosis present

## 2021-03-29 DIAGNOSIS — K219 Gastro-esophageal reflux disease without esophagitis: Secondary | ICD-10-CM | POA: Diagnosis present

## 2021-03-29 DIAGNOSIS — Z8541 Personal history of malignant neoplasm of cervix uteri: Secondary | ICD-10-CM | POA: Diagnosis not present

## 2021-03-29 DIAGNOSIS — F32A Depression, unspecified: Secondary | ICD-10-CM | POA: Diagnosis present

## 2021-03-29 DIAGNOSIS — Z20822 Contact with and (suspected) exposure to covid-19: Secondary | ICD-10-CM | POA: Diagnosis present

## 2021-03-29 DIAGNOSIS — R4182 Altered mental status, unspecified: Secondary | ICD-10-CM | POA: Diagnosis present

## 2021-03-29 DIAGNOSIS — R55 Syncope and collapse: Secondary | ICD-10-CM | POA: Diagnosis not present

## 2021-03-29 DIAGNOSIS — F0390 Unspecified dementia without behavioral disturbance: Secondary | ICD-10-CM | POA: Diagnosis present

## 2021-03-29 DIAGNOSIS — I493 Ventricular premature depolarization: Secondary | ICD-10-CM | POA: Diagnosis present

## 2021-03-29 DIAGNOSIS — I130 Hypertensive heart and chronic kidney disease with heart failure and stage 1 through stage 4 chronic kidney disease, or unspecified chronic kidney disease: Secondary | ICD-10-CM | POA: Diagnosis present

## 2021-03-29 DIAGNOSIS — G40909 Epilepsy, unspecified, not intractable, without status epilepticus: Secondary | ICD-10-CM

## 2021-03-29 DIAGNOSIS — G459 Transient cerebral ischemic attack, unspecified: Secondary | ICD-10-CM | POA: Diagnosis present

## 2021-03-29 LAB — HEMOGLOBIN A1C
Hgb A1c MFr Bld: 6.7 % — ABNORMAL HIGH (ref 4.8–5.6)
Mean Plasma Glucose: 146 mg/dL

## 2021-03-29 LAB — BASIC METABOLIC PANEL
Anion gap: 10 (ref 5–15)
BUN: 21 mg/dL (ref 8–23)
CO2: 27 mmol/L (ref 22–32)
Calcium: 9.3 mg/dL (ref 8.9–10.3)
Chloride: 105 mmol/L (ref 98–111)
Creatinine, Ser: 1.64 mg/dL — ABNORMAL HIGH (ref 0.44–1.00)
GFR, Estimated: 32 mL/min — ABNORMAL LOW (ref >60)
Glucose, Bld: 121 mg/dL — ABNORMAL HIGH (ref 70–99)
Potassium: 3.7 mmol/L (ref 3.5–5.1)
Sodium: 142 mmol/L (ref 135–145)

## 2021-03-29 LAB — GLUCOSE, CAPILLARY
Glucose-Capillary: 122 mg/dL — ABNORMAL HIGH (ref 70–99)
Glucose-Capillary: 130 mg/dL — ABNORMAL HIGH (ref 70–99)

## 2021-03-29 MED ORDER — VALPROIC ACID 250 MG PO CAPS
500.0000 mg | ORAL_CAPSULE | Freq: Two times a day (BID) | ORAL | Status: DC
  Administered 2021-03-29: 500 mg via ORAL
  Filled 2021-03-29 (×2): qty 2

## 2021-03-29 MED ORDER — LORAZEPAM 2 MG/ML IJ SOLN
1.0000 mg | Freq: Once | INTRAMUSCULAR | Status: AC | PRN
  Administered 2021-03-29: 1 mg via INTRAVENOUS
  Filled 2021-03-29: qty 1

## 2021-03-29 MED ORDER — VALPROIC ACID 250 MG PO CAPS: 500.0000 mg | ORAL_CAPSULE | Freq: Two times a day (BID) | ORAL | 1 refills | Status: DC

## 2021-03-29 MED ORDER — GADOBUTROL 1 MMOL/ML IV SOLN
10.0000 mL | Freq: Once | INTRAVENOUS | Status: AC | PRN
  Administered 2021-03-29: 10 mL via INTRAVENOUS

## 2021-03-29 NOTE — Discharge Summary (Signed)
[name] RCV:[id number] DOB: 1941/07/29 DOA: 03/26/2021  PCP: Vivi Barrack, MD  Admit date: 03/26/2021 Discharge date: 03/29/2021  Time spent: 40 minutes  Recommendations for Outpatient Follow-up:  1. Neurology f/u 2-4 weeks, will need LFTs, ammonia 2. Consider sleep study     Discharge Diagnoses:  Principal Problem:   TIA (transient ischemic attack) Active Problems:   GERD   Schizophrenia, chronic condition (HCC)   Acute on chronic diastolic CHF (congestive heart failure) (HCC)   Obesity, Class III, BMI 40-49.9 (morbid obesity) ([place])   Dementia ([place])   Convulsion ([place])   Discharge Condition: fair  Diet recommendation: heart healthy  Filed Weights   03/26/21 1840 03/27/21 0727 03/28/21 0930  Weight: 124.1 kg 122.4 kg 122.8 kg    History of present illness:  Shelby Hammock McKinneyis a 80 y.o.femalewith medical history significant forchronicdiastolic CHF last 2D echo done in 20s1showed EF of 65 to 70% with grade 1 diastolic dysfunction,chronic kidney disease stage III - IV, hypertension, gout,COPD who was brought into the ER by family for evaluation. Patient has dementia and is unable to provide any history.Her daughter states that she and her brother alternate taking care of their mother and that her brother was taking care of the mother overnight. Theywere concerned that patient had not voided all through the night because she is usually incontinent of urine and is on furosemide 80 mg daily. Her brother walked the patient to the bathroom and while she was sitting on the commode he states that she suddenly had an episode that he describes as generalized shaking spell, transient unresponsiveness, tongue hanging out of her mouth and leaning towards her left side. According to the daughter this lasted about 15 seconds and then patient became more awake but did not respond to any verbal stimuli.Her son tried to get her up but noticed that her left side was  weak.This improved after a few minutes and patient was able to ambulate back to her room using her Rollator. They decided to bring the patient to the ER to get evaluated for possible stroke.  Hospital Course:  #Seizure-like activity Brief episode of left sided weakness at home with preceding possible loc preceded by convulsive activity. Appears to be back to baseline. Underlying dementia. CT shows evidence prior stroke. MRI no signs mass or stroke. EEG with  "evidence of potential epileptogenicity arising from left frontotemporal region." Followed by neuro - start depakote bid, f/u neurology 2-4 weeks (patient has appt with [facility] scheduled in July; daughter will call to re-schedule sooner)  # Widened mediastinum On cxr. Radiology thinks likely positional, aneurysm on ddx. CT with tortuous aorta, no other abnormality     Procedures:  eeg   Consultations:  neurology  Discharge Exam: Vitals:   03/29/21 0921 03/29/21 1131  BP: (!) 149/71 111/65  Pulse: 70 69  Resp: 14 17  Temp: 98.5 F (36.9 C) 98.2 F (36.8 C)  SpO2: 95% 97%    General exam: Appears calm and comfortable  Respiratory system: Clear to auscultation. Respiratory effort normal. Cardiovascular system: S1 & S2 heard, RRR. No JVD, murmurs, rubs, gallops or clicks. No pedal edema. Gastrointestinal system: Abdomen is obese , soft and nontender. No organomegaly or masses felt. Normal bowel sounds heard. Central nervous system: Alert and oriented. Moving all 4 extremities. Cn 2-12 grossly intact. 5/5 strength Extremities: trace LE edema Skin: No rashes, lesions or ulcers Psychiatry: confused, alert   Discharge Instructions   Discharge Instructions    Diet -  low sodium heart healthy   Complete by: As directed    Increase activity slowly   Complete by: As directed      Allergies as of 03/29/2021      Reactions   Aspirin Other (See Comments)   Bleeding from vagina   Sulfamethoxazole-trimethoprim  Itching      Medication List    TAKE these medications   acetaminophen 500 MG tablet Commonly known as: TYLENOL Take 500 mg by mouth at bedtime.   albuterol 108 (90 Base) MCG/ACT inhaler Commonly known as: VENTOLIN HFA Inhale 2 puffs into the lungs every 6 (six) hours as needed for wheezing or shortness of breath.   allopurinol 300 MG tablet Commonly known as: ZYLOPRIM TAKE 1 TABLET BY MOUTH  DAILY   amLODipine 5 MG tablet Commonly known as: NORVASC Take 1 tablet (5 mg total) by mouth daily.   ascorbic acid 500 MG tablet Commonly known as: VITAMIN C Take 500 mg by mouth daily.   atorvastatin 40 MG tablet Commonly known as: LIPITOR TAKE 1 TABLET BY MOUTH  DAILY   budesonide 0.25 MG/2ML nebulizer solution Commonly known as: PULMICORT Take 2 mLs (0.25 mg total) by nebulization 2 (two) times daily.   buPROPion 300 MG 24 hr tablet Commonly known as: WELLBUTRIN XL Take 1 tablet (300 mg total) by mouth every morning.   carvedilol 3.125 MG tablet Commonly known as: COREG Take 1 tablet (3.125 mg total) by mouth 2 (two) times daily with a meal.   diclofenac Sodium 1 % Gel Commonly known as: VOLTAREN Apply 4 g topically 4 (four) times daily as needed (for pain).   donepezil 5 MG tablet Commonly known as: Aricept Take 1 tablet (5 mg total) by mouth at bedtime.   fluticasone 50 MCG/ACT nasal spray Commonly known as: FLONASE Place into both nostrils as needed for allergies or rhinitis.   furosemide 80 MG tablet Commonly known as: LASIX Take 80 mg by mouth daily.   ipratropium-albuterol 0.5-2.5 (3) MG/3ML Soln Commonly known as: DUONEB Take 3 mLs by nebulization 2 (two) times daily.   ketoconazole 2 % cream Commonly known as: NIZORAL Apply 1 application topically 2 (two) times daily.   losartan 100 MG tablet Commonly known as: COZAAR Take 1 tablet (100 mg total) by mouth daily.   nystatin powder Commonly known as: MYCOSTATIN/NYSTOP Apply 1 application  topically 3 (three) times daily.   OLANZapine 2.5 MG tablet Commonly known as: ZyPREXA Take 1 tab at bedtime What changed:   how much to take  how to take this  when to take this   pantoprazole 40 MG tablet Commonly known as: PROTONIX TAKE 1 TABLET BY MOUTH  TWICE DAILY BEFORE A MEAL What changed:   how much to take  how to take this  when to take this   traMADol 50 MG tablet Commonly known as: ULTRAM TAKE 1 TABLET BY MOUTH  TWICE DAILY AS NEEDED What changed:   when to take this  additional instructions   valproic acid 250 MG capsule Commonly known as: DEPAKENE Take 2 capsules (500 mg total) by mouth 2 (two) times daily.   Vitamin B12 1000 MCG Tbcr Take 1 tablet by mouth daily.      Allergies  Allergen Reactions  . Aspirin Other (See Comments)    Bleeding from vagina   . Sulfamethoxazole-Trimethoprim Itching      The results of significant diagnostics from this hospitalization (including imaging, microbiology, ancillary and laboratory) are listed below for reference.  Significant Diagnostic Studies: CT Head Wo Contrast  Result Date: 03/26/2021 CLINICAL DATA:  Mental status change with unknown cause. EXAM: CT HEAD WITHOUT CONTRAST TECHNIQUE: Contiguous axial images were obtained from the base of the skull through the vertex without intravenous contrast. COMPARISON:  Brain MRI 12/29/2019 FINDINGS: Brain: Chronic lacunar infarct at the right thalamus which has occurred since prior. Chronic small vessel ischemia in the cerebral white matter which is confluent and locations. History of subdural hematoma with bilateral burr hole. No visible residual collection or acute hemorrhage. No hydrocephalus or masslike finding Vascular: No hyperdense vessel or unexpected calcification. Skull: Bifrontal burr holes which are unremarkable. Sinuses/Orbits: Negative IMPRESSION: 1. No acute finding. 2. Chronic small vessel disease with chronic right thalamic lacunar infarct  which has occurred since a 2021 comparison. Electronically Signed   By: Monte Fantasia M.D.   On: 03/26/2021 09:20   CT CHEST WO CONTRAST  Result Date: 03/27/2021 CLINICAL DATA:  Abnormal mediastinal widening by x-ray. EXAM: CT CHEST WITHOUT CONTRAST TECHNIQUE: Multidetector CT imaging of the chest was performed following the standard protocol without IV contrast. COMPARISON:  Radiography from yesterday FINDINGS: Cardiovascular: Normal heart size. Aortic atherosclerosis and tortuosity which correlates with the radiographic appearance. Negative for aneurysm or visible intramural hematoma. Coronary atherosclerotic calcification. Mediastinum/Nodes: No adenopathy or mass. Lungs/Pleura: Paraseptal emphysema at the apices. There is no edema, consolidation, effusion, or pneumothorax. Upper Abdomen: Cystic density at the upper pole right kidney. Musculoskeletal: Spondylosis with generalized bridging osteophytes. IMPRESSION: The radiographic abnormality correlates with aortic tortuosity. No aneurysm, adenopathy, mediastinal hematoma. Aortic and coronary atherosclerosis. Electronically Signed   By: Monte Fantasia M.D.   On: 03/27/2021 08:53   MR BRAIN WO CONTRAST  Result Date: 03/26/2021 CLINICAL DATA:  TIA. EXAM: MRI HEAD WITHOUT CONTRAST TECHNIQUE: Multiplanar, multiecho pulse sequences of the brain and surrounding structures were obtained without intravenous contrast. COMPARISON:  CT head Mar 26, 2021. FINDINGS: Incomplete and motion limited exam with only DWI/ADC sequences performed. No evidence of an obvious/large acute infarct. IMPRESSION: Incomplete and motion limited exam with only DWI/ADC sequences performed. No evidence of an obvious/large acute infarct. If the patient is able, a repeat MRI could provide more sensitive/complete evaluation. Electronically Signed   By: Margaretha Sheffield MD   On: 03/26/2021 14:18   MR BRAIN W WO CONTRAST  Result Date: 03/29/2021 CLINICAL DATA:  Mental status change.   Stroke suspected. EXAM: MRI HEAD WITHOUT AND WITH CONTRAST TECHNIQUE: Multiplanar, multiecho pulse sequences of the brain and surrounding structures were obtained without and with intravenous contrast. CONTRAST:  54mL GADAVIST GADOBUTROL 1 MMOL/ML IV SOLN COMPARISON:  MRI in CT 03/26/2021 FINDINGS: Brain: These emanation again suffers from motion degradation but is probably sufficiently diagnostic. Diffusion imaging does not show any acute or subacute infarction. No focal abnormality affects the brainstem or cerebellum. Cerebral hemispheres show old infarction in the right medial thalamus and moderate chronic small-vessel ischemic changes of the cerebral hemispheric white matter. No cortical or large vessel territory infarction. No mass lesion, hemorrhage, extra-axial collection or abnormal parenchymal enhancement. The patient does show some dural enhancement. There are bilateral burr holes on this probably represents benign dural thickening due to previous subdural hematomas. Vascular: Major vessels at the base of the brain show flow. Skull and upper cervical spine: No acute finding. Sinuses/Orbits: Clear/normal Other: None IMPRESSION: No acute finding. Old right thalamic infarction. Chronic small-vessel ischemic changes of the cerebral hemispheric white matter. Mild bilateral dural thickening and enhancement in this patient with bilateral burr holes.  This is probably chronic benign dural thickening as a sequela previous subdural hematomas. Electronically Signed   By: Nelson Chimes M.D.   On: 03/29/2021 09:58   DG Chest Port 1 View  Result Date: 03/26/2021 CLINICAL DATA:  80 year old female with altered mental status. EXAM: PORTABLE CHEST - 1 VIEW COMPARISON:  10/13/2020 FINDINGS: There is widening of the mediastinum, specifically prominence of the ascending thoracic aorta, likely secondary to the patient being rotated to the right. No cardiomegaly. Mild prominence of the pulmonary vasculature and hazy diffuse  opacities, most prominent in the lung bases. No evidence of significant pleural effusion or evidence of pneumothorax. No acute osseous abnormality. IMPRESSION: 1. Widening of the mediastinum, likely secondary to technical factors (rotation of the patient to the right), however ascending aortic aneurysmal changes could appear similarly. Consider chest CT for further evaluation in the appropriate clinical setting. 2. Mild pulmonary edema. Electronically Signed   By: Ruthann Cancer MD   On: 03/26/2021 09:20   EEG adult  Result Date: 03/28/2021 Lora Havens, MD     03/28/2021  6:13 PM Patient Name: [name] MRN: [id number] Epilepsy Attending: Lora Havens Referring Physician/Provider: Dr Su Monks Date: 03/28/2021 Duration: 24.58 mins Patient history: 80 year old with a history of diastolic CHF, CKD 3-4, hypertension, gout, COPD brought in for evaluation of episode of transient unresponsiveness with shaking while on the commode. EEG to evaluate for seizure Level of alertness: Awake, asleep AEDs during EEG study: None Technical aspects: This EEG study was done with scalp electrodes positioned according to the 10-20 International system of electrode placement. Electrical activity was acquired at a sampling rate of 500Hz  and reviewed with a high frequency filter of 70Hz  and a low frequency filter of 1Hz . EEG data were recorded continuously and digitally stored. Description: The posterior dominant rhythm consists of 8-9 Hz activity of moderate voltage (25-35 uV) seen predominantly in posterior head regions, symmetric and reactive to eye opening and eye closing.Sleep was characterized by vertex waves, sleep spindles (12 to 14 Hz), maximal frontocentral region.  EEG showed intermittent 5 to 6 Hz theta slowing in left frontal region. Single spike was  noted in left frontotemporal region.  Physiologic photic driving was not seen during photic stimulation.  Hyperventilation was not performed.   ABNORMALITY -  Spike, left frontotemporal region. - Intermittent slow, left frontal region. IMPRESSION: This study showed evidence of potential epileptogenicity arising from left frontotemporal region. There is also non-specific cortical dysfunction in left frontal region. No seizures were seen throughout the recording. Lora Havens    Microbiology: Recent Results (from the past 240 hour(s))  SARS CORONAVIRUS 2 (TAT 6-24 HRS) Nasopharyngeal Nasopharyngeal Swab     Status: None   Collection Time: 03/26/21 11:55 AM   Specimen: Nasopharyngeal Swab  Result Value Ref Range Status   SARS Coronavirus 2 NEGATIVE NEGATIVE Final    Comment: (NOTE) SARS-CoV-2 target nucleic acids are NOT DETECTED.  The SARS-CoV-2 RNA is generally detectable in upper and lower respiratory specimens during the acute phase of infection. Negative results do not preclude SARS-CoV-2 infection, do not rule out co-infections with other pathogens, and should not be used as the sole basis for treatment or other patient management decisions. Negative results must be combined with clinical observations, patient history, and epidemiological information. The expected result is Negative.  Fact Sheet for Patients: SugarRoll.be  Fact Sheet for Healthcare Providers: https://www.woods-mathews.com/  This test is not yet approved or cleared by the [place] FDA and  has been authorized for detection and/or diagnosis of SARS-CoV-2 by FDA under an Emergency Use Authorization (EUA). This EUA will remain  in effect (meaning this test can be used) for the duration of the COVID-19 declaration under Se ction 564(b)(1) of the Act, 21 U.S.C. section 360bbb-3(b)(1), unless the authorization is terminated or revoked sooner.  Performed at [facility], [place] [address] [place], [place] [address]      Labs: Basic Metabolic Panel: Recent Labs  Lab 03/26/21 0813 03/27/21 0535 03/28/21 0708  03/29/21 0632  NA 142 142 143 142  K 4.6 3.3* 3.8 3.7  CL 104 106 107 105  CO2 25 26 26 27   GLUCOSE 146* 117* 130* 121*  BUN 24* 22 21 21   CREATININE 1.99* 1.41* 1.42* 1.64*  CALCIUM 9.5 9.0 9.3 9.3  MG  --  1.7  --   --    Liver Function Tests: Recent Labs  Lab 03/26/21 0813  AST 35  ALT 16  ALKPHOS 114  BILITOT 1.3*  PROT 7.9  ALBUMIN 4.0   No results for input(s): LIPASE, AMYLASE in the last 168 hours. No results for input(s): AMMONIA in the last 168 hours. CBC: Recent Labs  Lab 03/26/21 0813 03/27/21 0535  WBC 8.4 7.2  HGB 12.7 12.0  HCT 39.0 36.0  MCV 95.1 93.5  PLT 208 194   Cardiac Enzymes: No results for input(s): CKTOTAL, CKMB, CKMBINDEX, TROPONINI in the last 168 hours. BNP: BNP (last 3 results) Recent Labs    09/22/20 1747 10/13/20 1958  BNP 35.2 190.6*    ProBNP (last 3 results) No results for input(s): PROBNP in the last 8760 hours.  CBG: Recent Labs  Lab 03/26/21 0807 03/27/21 1125 03/28/21 1124 03/29/21 0744 03/29/21 1131  GLUCAP 146* 161* 157* 122* 130*       Signed:  Desma Maxim MD.  [facility] 03/29/2021, 12:54 PM

## 2021-03-29 NOTE — Progress Notes (Signed)
Patient discharged to home, wheeled out of unit by transport, with all belongings and accompanied by daughter, legal guardian.  VSS.  Medications and discharge instructions reviewed.  All questions answered.  PIV x 1 removed, no bleeding, intact.  Legal guardian verbalized understanding of signs and symptoms of infection and agreed to follow up with Neurology appointment and all other appointments as listed on AVS.

## 2021-03-29 NOTE — TOC Transition Note (Addendum)
Transition of Care [facility]) - CM/SW Discharge Note   Patient Details  Name: [name] MRN: [id number] Date of Birth: Mar 04, 1941  [facility] CM/SW Contact:  Shelby Sam, LCSW Phone Number: 03/29/2021, 1:03 PM   Clinical Narrative:     Patient will DC to: home with home health Anticipated DC date: 03/29/21 Family notified:[name] (daughter) Transport by: daughter to transport home  Per MD patient ready for DC to home with home health services . Patient is set up with [facility] for PT, OT, RN and aide. [name] with [facility] informed of dc today. Reports they will be able to see patient on 6/4.  No identified DME needs per patient's daughter [name]. Patient's daughter to transport home.   CSW signing off Pricilla Riffle, LCSW     Final next level of care: [place] Barriers to Discharge: No Barriers Identified   Patient Goals and CMS Choice Patient states their goals for this hospitalization and ongoing recovery are:: to go home CMS Medicare.gov Compare Post Acute Care list provided to:: Patient Choice offered to / list presented to : Patient  Discharge Placement                Patient to be transferred to facility by: daughter Name of family member notified: [name] (daughter) Patient and family notified of of transfer: 03/29/21  Discharge Plan and Services     Post Acute Care Choice: [name]: PT,OT,RN,Nurse's Aide [place]: [place] ([place]) Date HH Agency Contacted: 03/29/21 Time [place]: [place] Representative spoke with at [place]: [name] ([employer]) Interventions     Readmission Risk Interventions No flowsheet data found.

## 2021-03-29 NOTE — Plan of Care (Signed)
  Problem: Education: Goal: Knowledge of General Education information will improve Description: Including pain rating scale, medication(s)/side effects and non-pharmacologic comfort measures Outcome: Completed/Met   Problem: Health Behavior/Discharge Planning: Goal: Ability to manage health-related needs will improve Outcome: Completed/Met   Problem: Clinical Measurements: Goal: Ability to maintain clinical measurements within normal limits will improve Outcome: Completed/Met Goal: Will remain free from infection Outcome: Completed/Met Goal: Diagnostic test results will improve Outcome: Completed/Met Goal: Respiratory complications will improve Outcome: Completed/Met Goal: Cardiovascular complication will be avoided Outcome: Completed/Met   Problem: Activity: Goal: Risk for activity intolerance will decrease Outcome: Completed/Met   Problem: Nutrition: Goal: Adequate nutrition will be maintained Outcome: Completed/Met   Problem: Coping: Goal: Level of anxiety will decrease Outcome: Completed/Met   Problem: Elimination: Goal: Will not experience complications related to bowel motility Outcome: Completed/Met Goal: Will not experience complications related to urinary retention Outcome: Completed/Met   Problem: Pain Managment: Goal: General experience of comfort will improve Outcome: Completed/Met   Problem: Safety: Goal: Ability to remain free from injury will improve Outcome: Completed/Met   Problem: Skin Integrity: Goal: Risk for impaired skin integrity will decrease Outcome: Completed/Met   Problem: Education: Goal: Knowledge of disease or condition will improve Outcome: Completed/Met Goal: Knowledge of patient specific risk factors addressed and post discharge goals established will improve Outcome: Completed/Met

## 2021-03-29 NOTE — Progress Notes (Signed)
Neurology Progress Note HPI from initial consultation note Pt unable to provide hx 2/2 dementia. Per H&P "Shelby Spruiell McKinneyis a 80 y.o.femalewith medical history significant forchronicdiastolic CHF last 2D echo done in 20s1showed EF of 65 to 70% with grade 1 diastolic dysfunction,chronic kidney disease stage III - IV, hypertension, gout,COPD who was brought into the ER by family for evaluation after episode of transient unresponsiveness with shaking while on commode. Patient has dementia and is unable to provide any history.Her daughter states that she and her brother alternate taking care of their mother and that her brother was taking care of the mother overnight. Theywere concerned that patient had not voided all through the night because she is usually incontinent of urine and is on furosemide 80 mg daily. Her brother walked the patient to the bathroom and while she was sitting on the commode he states that she suddenly had an episode that he describes as generalized shaking spell, transient unresponsiveness, tongue hanging out of her mouth and leaning towards her left side. According to the daughter this lasted about 15 seconds and then patient became more awake but did not respond to any verbal stimuli.Her son tried to get her up but noticed that her left side was weak.This improved after a few minutes and patient was able to ambulate back to her room using her Rollator. They decided to bring the patient to the ER to get evaluated for possible stroke. I am unable to do review of systems on this patient due to her underlying dementia. Show sodium 142, potassium 4.6, chloride 104, bicarb 25, glucose 1.6, BUN 24, creatinine 1.9, calcium 9.5, alkaline phosphatase 114, albumin 4.0, AST 35, ALT 16, total protein 7.9, total bilirubin 1.3, white count 8.4, hemoglobin 12.7, hematocrit 39, MCV 95.9, RDW 15.7, platelet 208. Chest x-ray reviewed by me showswidening of the mediastinum, likely  secondary to technical factors (rotation of the patient to the right), however ascending aortic aneurysmal changes could appear similarly. Consider chest CT for further evaluation in the appropriate clinical setting. Mild pulmonary edema. CT scan of the head without contrast shows No acute finding. Chronic small vessel disease with chronic right thalamic lacunar infarct which has occurred since a 2021 comparison. Twelve-lead EKG reviewed by me shows sinus rhythm with PVCs."  Above hx confirmed with daughter. While patient being examined at bedside she is oriented to self only, and becomes agitated stating that the examiner, daughter, and patient all "have the devil in [place]" and we need to "get it out! Get it out!" She has had no further events since hospitalization.  S:// No acute events overnight.  Daughter reports that she is sleeping peacefully   O:// Current vital signs: BP 120/83 (BP Location: Right Arm)   Pulse 74   Temp 98.6 F (37 C) (Oral)   Resp 18   Ht 5\' 3"  (1.6 m)   Wt 122.8 kg   SpO2 98%   BMI 47.96 kg/m  Vital signs in last 24 hours: Temp:  [98 F (36.7 C)-98.7 F (37.1 C)] 98.6 F (37 C) (05/31 0440) Pulse Rate:  [70-77] 74 (05/31 0440) Resp:  [18-19] 18 (05/31 0440) BP: (118-139)/(43-83) 120/83 (05/31 0440) SpO2:  [96 %-98 %] 98 % (05/31 0440) Weight:  [122.8 kg] 122.8 kg (05/30 0930) General: Comfortably sleeping, awakens to voice. HEENT: Normocephalic atraumatic, edentulous Lungs: Clear Cardiovascular: Regular rate rhythm Extremities warm well perfused Neurological exam Awake alert oriented to self. Laughs and smiles and most questions but does not follow commands. Somewhat  verbal with mild dysarthria Difficult assess for aphasia but it does seem like she has both receptive and expressive aphasia. Cranial nerves: Pupils equal round reactive to light, extraocular movements appear intact as observed when she was tracking me from side to side, blinks to  threat from both sides, face appears symmetric, tongue and palate midline. Motor exam: Moves bilateral upper extremities against gravity much more comfortably than lower extremities-no focal weakness noted.  Definitely weaker in the lower extremities compared to upper extremities but that is a diffuse process. Sensation intact to touch Coordination difficult to assess due to her poor attention concentration   Medications  Current Facility-Administered Medications:  .   stroke: mapping our early stages of recovery book, , Does not apply, Once, Agbata, Tochukwu, MD .  0.9 %  sodium chloride infusion, 250 mL, Intravenous, PRN, Agbata, Tochukwu, MD .  acetaminophen (TYLENOL) tablet 500 mg, 500 mg, Oral, QHS, Agbata, Tochukwu, MD, 500 mg at 03/28/21 2109 .  allopurinol (ZYLOPRIM) tablet 200 mg, 200 mg, Oral, Daily, Agbata, Tochukwu, MD, 200 mg at 03/28/21 1125 .  ascorbic acid (VITAMIN C) tablet 500 mg, 500 mg, Oral, Daily, Agbata, Tochukwu, MD, 500 mg at 03/28/21 1126 .  atorvastatin (LIPITOR) tablet 40 mg, 40 mg, Oral, Daily, Agbata, Tochukwu, MD, 40 mg at 03/28/21 1126 .  budesonide (PULMICORT) nebulizer solution 0.5 mg, 0.5 mg, Nebulization, BID PRN, Wouk, Ailene Rud, MD .  buPROPion (WELLBUTRIN XL) 24 hr tablet 300 mg, 300 mg, Oral, q morning, Agbata, Tochukwu, MD, 300 mg at 03/28/21 1126 .  carvedilol (COREG) tablet 3.125 mg, 3.125 mg, Oral, BID WC, Agbata, Tochukwu, MD, 3.125 mg at 03/28/21 1746 .  donepezil (ARICEPT) tablet 5 mg, 5 mg, Oral, QHS, Agbata, Tochukwu, MD, 5 mg at 03/28/21 2109 .  enoxaparin (LOVENOX) injection 60 mg, 0.5 mg/kg, Subcutaneous, Q24H, Wouk, Ailene Rud, MD, 60 mg at 03/28/21 1127 .  furosemide (LASIX) tablet 80 mg, 80 mg, Oral, Daily, Wouk, Ailene Rud, MD, 80 mg at 03/28/21 1125 .  ipratropium-albuterol (DUONEB) 0.5-2.5 (3) MG/3ML nebulizer solution 3 mL, 3 mL, Nebulization, Q6H PRN, Wouk, Ailene Rud, MD .  losartan (COZAAR) tablet 100 mg, 100 mg, Oral,  Daily, Agbata, Tochukwu, MD, 100 mg at 03/28/21 1125 .  OLANZapine (ZYPREXA) tablet 2.5 mg, 2.5 mg, Oral, QHS, Agbata, Tochukwu, MD, 2.5 mg at 03/28/21 2108 .  ondansetron (ZOFRAN) tablet 4 mg, 4 mg, Oral, Q6H PRN **OR** ondansetron (ZOFRAN) injection 4 mg, 4 mg, Intravenous, Q6H PRN, Agbata, Tochukwu, MD .  pantoprazole (PROTONIX) EC tablet 40 mg, 40 mg, Oral, BID AC, Agbata, Tochukwu, MD, 40 mg at 03/28/21 1746 .  sodium chloride flush (NS) 0.9 % injection 3 mL, 3 mL, Intravenous, Q12H, Agbata, Tochukwu, MD, 3 mL at 03/28/21 2114 .  sodium chloride flush (NS) 0.9 % injection 3 mL, 3 mL, Intravenous, PRN, Agbata, Tochukwu, MD .  traMADol (ULTRAM) tablet 50 mg, 50 mg, Oral, Daily, Wouk, Ailene Rud, MD, 50 mg at 03/28/21 1127 .  vitamin B-12 (CYANOCOBALAMIN) tablet 1,000 mcg, 1,000 mcg, Oral, Daily, Agbata, Tochukwu, MD, 1,000 mcg at 03/28/21 1126 Labs CBC    Component Value Date/Time   WBC 7.2 03/27/2021 0535   RBC 3.85 (L) 03/27/2021 0535   HGB 12.0 03/27/2021 0535   HGB 12.2 06/07/2017 1014   HCT 36.0 03/27/2021 0535   HCT 38.1 06/07/2017 1014   PLT 194 03/27/2021 0535   PLT 230 06/07/2017 1014   MCV 93.5 03/27/2021 0535   MCV 95 06/07/2017 1014  MCH 31.2 03/27/2021 0535   MCHC 33.3 03/27/2021 0535   RDW 15.6 (H) 03/27/2021 0535   RDW 17.4 (H) 06/07/2017 1014   LYMPHSABS 1.7 09/22/2020 1747   MONOABS 0.6 09/22/2020 1747   EOSABS 0.2 09/22/2020 1747   BASOSABS 0.0 09/22/2020 1747    CMP     Component Value Date/Time   NA 142 03/29/2021 0632   NA 144 01/25/2021 0000   K 3.7 03/29/2021 0632   CL 105 03/29/2021 0632   CO2 27 03/29/2021 0632   GLUCOSE 121 (H) 03/29/2021 0632   BUN 21 03/29/2021 0632   BUN 18 01/25/2021 0000   CREATININE 1.64 (H) 03/29/2021 0632   CREATININE 1.43 (H) 09/22/2020 1528   CALCIUM 9.3 03/29/2021 0632   PROT 7.9 03/26/2021 0813   ALBUMIN 4.0 03/26/2021 0813   AST 35 03/26/2021 0813   ALT 16 03/26/2021 0813   ALKPHOS 114 03/26/2021 0813    BILITOT 1.3 (H) 03/26/2021 0813   GFRNONAA 32 (L) 03/29/2021 0632   GFRNONAA 40 (L) 05/19/2014 1136   GFRAA 29 01/25/2021 0000   GFRAA 46 (L) 05/19/2014 1136   Imaging I have reviewed images in epic and the results pertinent to this consultation are: CT head with no acute changes, chronic small vessel disease with chronic right thalamic lacunar infarction which has happened since the 2021 comparison.  MRI brain-extremely motion greater than nondiagnostic.  Second attempt at MRI also failed.  Another attempt will be performed today.   EEG showed left frontal spikes and left-sided slowing.  Assessment:  80 year old with a history of diastolic CHF, CKD 3-4, hypertension, gout, COPD brought in for evaluation of episode of transient unresponsiveness with shaking while on the commode. Differentials seizures versus convulsive syncope. Very atypical for a TIA-do not suspect this is a TIA. EEG with left hemispheric abnormality including slowing and left frontotemporal spikes. This could be consistent with underlying seizures but also could indicate a left hemispheric structural abnormality which an MRI would be really helpful to look at. Nondiagnostic MRI due to motion and cooperation x2.  Another attempt for an MRI will be made today. No AEDs initiated for now but I am inclined to recommending starting AED given h/o dementia, witnessed convulsions, abnormal EEG.  Impression: Evaluate for seizure versus convulsive syncope.  Recommendations:  MRI technologist are aware and will attempt once again to get an MRI.  After the MRI is completed, if there is no structural abnormality, I would recommend initiation of antiepileptics- given her dementia and CKD, I would recommend Depakote instead of Keppra.  I will update final recommendations once imaging studies are made availabl  Outpatient sleep study - given body habitus, high risk for OSA and sleep disorders    -- Amie Portland,  MD Neurologist [facility] Pager: [phone]  Addendum MRI brain completed and reviewed personally.  No acute stroke.  Chronic right thalamic infarction.  Chronic small vessel ischemic changes.  No gross structural abnormalities to explain the left-sided EEG findings.  Mild bilateral dural thickening and enhancement with bilateral bur holes-probably chronic benign dural thickening as a sequela of previous subdural hematomas  I suspect that she has susceptibility to have seizures given her advanced age, prior subdurals and dementia.  I would recommend starting her on Depakote-500 mg twice daily.  Follow-up with outpatient neurology in 2 to 4 weeks-Depakote levels as well as a liver function panel and ammonia should be checked at that time as well.  Also consider outpatient sleep study.  Plan  discussed with Dr. Si Raider via secure chat.  -- Amie Portland, MD Neurologist [facility] Pager: [phone]

## 2021-03-31 ENCOUNTER — Telehealth: Payer: Self-pay

## 2021-03-31 NOTE — Telephone Encounter (Cosign Needed)
Transition Care Management Follow-up Telephone Call  Date of discharge and from where: 5/31 [facility]   How have you been since you were released from the hospital? ok  Any questions or concerns? No  Items Reviewed:  Did the pt receive and understand the discharge instructions provided? Yes   Medications obtained and verified? Yes   Other? No   Any new allergies since your discharge? No   Dietary orders reviewed? Yes  Do you have support at home? Yes   Home Care and Equipment/Supplies: Were home health services ordered? not applicable If so, what is the name of the agency?   Has the agency set up a time to come to the patient's home? not applicable Were any new equipment or medical supplies ordered?  No What is the name of the medical supply agency?  Were you able to get the supplies/equipment? not applicable Do you have any questions related to the use of the equipment or supplies? No  Functional Questionnaire: (I = Independent and D = Dependent) ADLs: D  Bathing/Dressing- D  Meal Prep- D  Eating- D  Maintaining continence- D  Transferring/Ambulation- I  Managing Meds- D  Follow up appointments reviewed:   PCP Hospital f/u appt confirmed? Yes  04/04/21 however needs to reschedule .  [facility] f/u appt confirmed? Yes  Scheduled to see Neurologist on 04/01/21  Are transportation arrangements needed? Yes   If their condition worsens, is the pt aware to call PCP or go to the Emergency Dept.? Yes  Was the patient provided with contact information for the PCP's office or ED? Yes  Was to pt encouraged to call back with questions or concerns? Yes

## 2021-03-31 NOTE — Chronic Care Management (AMB) (Addendum)
Chronic Care Management Pharmacy Assistant   Name: [name]  MRN: [id number] DOB: 11-10-1940  [name] is an 80 y.o. year old female who presents for her initial CCM visit with the clinical pharmacist.  Recent office visits:  02/28/21-Shelby [name] MD (PCP)- Office visit for management of chronic conditions. Labs completed. DISCONTINUED Hydrodyzine 25mg .  Follow up in 6 months.  01/20/21-Shelby [name] MD (PCP)- Oiffice visit for rash. Referral to [facility] care management and Neurology. STARTED Fluconazole 150mg  take 1 once; STARTED Ketoconazole 2% 1 application topically twice daily and Nystatin [id number] unit/gm 1 application topically three times daily.  Recent consult visits:  03/25/21-Shelby Johney Frame MD (Cardiology)- Office visit for heart failure.No medication changes. Recommended compression wraps to the lower extremities with leg elevation, Monitor daily weights, Low Na diet.  Follow up in 3 months. 02/08/21-Shelby Arfeen MD (Behavioral Health)-info unavailable without breaking the glass 01/12/21-Shelby Arfeen MD (Behavioral Health)-info unavailable without breaking the glass 01/03/21-Shelby Pemberton MD (Cardiology)- Telephone note- Recommended patient to use cough drops or OTC cough suppressants, use Zyrtec or Claritin. 12/31/20-Shelby Johney Frame MD (Cardiology)- Office visit for CHF. DECREASED Amlodipine from 10mg  daily to 5mg  take 1 daily.  DECREASED Carvedilol from 6.25mg  2 per day to Carvedilol 3.125mg  2 per day with meals.  Patient reported not taking Cefpodoxime Proxetil 100mg  and Metolazone 5mg . Follow up in 2 months. 12/24/20-Shelby Johney Frame MD (Cardiology)-Telephone note- Dr Johney Drake recommended switching Torsemide to 20mg  every M,W, F and 40mg  every T, [name], Sat, Sunday and repeat BMET in 14days. Patient's daughter reported she is no longer taking Torsemide and is on Lasix 80mg  per day.  02/09/22[name](Nephrology)-Data unavailable 12/01/20-Shelby Arfeen MD  (Psychiatry)data unavailable 02/01/22[name](Nephrology)-Data unavailable 10/20/21-Shelby Johney Frame MD (Cardiology)-No medication changes.  Labs completed. Follow up in 1 month 10/07/21-Shelby Johney Frame MD (Cardiology)-Office visit for CHF, Labs completed.  STARTED Torsemide 40mg  take 1 daily. INCREASED Carvedilol from 3.125 take 1 twice daily to Carvedilol 6.25mg  take 1 twice daily.  DISCONTINUED Furosemide 40mg  take 1 daily.  Followup in 2 weeks.   Hospital visits:  #2 Medication Reconciliation was completed by comparing discharge summary, patient's EMR and Pharmacy list, and upon discussion with patient.  Admitted to the hospital on 03/26/21 due to TIA. Discharge date was 03/29/21. Discharged from [place]?Medications Started at [facility] Discharge:?? -started Depakote 250mg  take 1 twice daily due to seizure like activity  Medication Changes at Hospital Discharge: -Changed None Medications Discontinued at Hospital Discharge: -Stopped None  Medications that remain the same after Hospital Discharge:??  -All other medications will remain the same.     #1 Medication Reconciliation was completed by comparing discharge summary, patient's EMR and Pharmacy list, and upon discussion with patient.  Admitted to the hospital on 10/13/21 due to chest Drake. Discharge date was 10/14/21. Discharged from [place]?Medications Started at [facility] Discharge:?? -started Lasix in the ED  Medication Changes at Hospital Discharge: -Changed None Medications Discontinued at Hospital Discharge: -Stopped None  Medications that remain the same after Hospital Discharge:??  -All other medications will remain the same.    Medications: Outpatient Encounter Medications as of 03/31/2021  Medication Sig  . acetaminophen (TYLENOL) 500 MG tablet Take 500 mg by mouth at bedtime.   [name] albuterol (VENTOLIN HFA) 108 (90 Base) MCG/ACT inhaler Inhale 2 puffs  into the lungs every 6 (six) hours as needed for wheezing or shortness of breath.  . allopurinol (ZYLOPRIM) 300 MG tablet TAKE 1 TABLET  BY MOUTH  DAILY (Patient taking differently: Take 300 mg by mouth daily.)  . amLODipine (NORVASC) 5 MG tablet Take 1 tablet (5 mg total) by mouth daily.  [name] ascorbic acid (VITAMIN C) 500 MG tablet Take 500 mg by mouth daily.   [name] atorvastatin (LIPITOR) 40 MG tablet TAKE 1 TABLET BY MOUTH  DAILY (Patient taking differently: Take 40 mg by mouth daily.)  . budesonide (PULMICORT) 0.25 MG/2ML nebulizer solution Take 2 mLs (0.25 mg total) by nebulization 2 (two) times daily.  [name] buPROPion (WELLBUTRIN XL) 300 MG 24 hr tablet Take 1 tablet (300 mg total) by mouth every morning.  . carvedilol (COREG) 3.125 MG tablet Take 1 tablet (3.125 mg total) by mouth 2 (two) times daily with a meal.  . Cyanocobalamin (VITAMIN B12) 1000 MCG TBCR Take 1 tablet by mouth daily.  . diclofenac Sodium (VOLTAREN) 1 % GEL Apply 4 g topically 4 (four) times daily as needed (for Drake).  [name] donepezil (ARICEPT) 5 MG tablet Take 1 tablet (5 mg total) by mouth at bedtime.  . fluticasone (FLONASE) 50 MCG/ACT nasal spray Place into both nostrils as needed for allergies or rhinitis.  . furosemide (LASIX) 80 MG tablet Take 80 mg by mouth daily.  [name] ipratropium-albuterol (DUONEB) 0.5-2.5 (3) MG/3ML SOLN Take 3 mLs by nebulization 2 (two) times daily.  [name] ketoconazole (NIZORAL) 2 % cream Apply 1 application topically 2 (two) times daily.  [name] losartan (COZAAR) 100 MG tablet Take 1 tablet (100 mg total) by mouth daily.  [name] nystatin (MYCOSTATIN/NYSTOP) powder Apply 1 application topically 3 (three) times daily.  [name] OLANZapine (ZYPREXA) 2.5 MG tablet Take 1 tab at bedtime (Patient taking differently: Take 2.5 mg by mouth at bedtime. Take 1 tab at bedtime)  . pantoprazole (PROTONIX) 40 MG tablet TAKE 1 TABLET BY MOUTH  TWICE DAILY BEFORE A MEAL (Patient taking differently: Take 40 mg by mouth 2 (two) times daily before  a meal. TAKE 1 TABLET BY MOUTH  TWICE DAILY BEFORE A MEAL)  . traMADol (ULTRAM) 50 MG tablet TAKE 1 TABLET BY MOUTH  TWICE DAILY AS NEEDED (Patient taking differently: Take 50 mg by mouth 2 (two) times daily. TAKE 1 TABLET BY MOUTH  TWICE DAILY AS NEEDED)  . valproic acid (DEPAKENE) 250 MG capsule Take 2 capsules (500 mg total) by mouth 2 (two) times daily.   No facility-administered encounter medications on file as of 03/31/2021.   Current Documented Medications Acetaminophen 500mg  OTC Albuterol 108 mcg/ACT last filled 09/30/20  25DS Allopurinol 300mg   Last filled 02/02/21  90DS Amlodipine 5 mg  Last filled 0506/22  90DS Ascorbic acid 500mg   OTC Atorvastatin 40mg  last filled 01/28/21  90 DS Budesonide 0.25mg /16ml  Last filled 10/07/20  30 DS Buproprion 300mg   Last filled 12/03/20  90DS Carveilol 3.125mg   Last filled 03/29/21 30DS Cyanocobalamin 1083mcg  OTC Diclofenac 1%  Last filled 05/28/20  90DS Donepezil 5mg   Last filled 02/22/21  30DS Fluticasone 76mcg  Last filled 01/28/21 90DS Furosemide 80mg   Last filled 03/29/21  30 DS Ipratropium-albuterol 0.5-2.5mg /52ml sol  Last filled 09/25/20  60DS Ketoconazole 2% cream  Last filled 01/20/21  30DS Losartan 100mg   Last filled 01/28/21  90DS Nystatin powder  Last filled 01/20/21  15DS Olanzapine 2.5mg   Last filled 03/30/21  30 DS Pantoprazole 40mg    Last filled 01/28/21  90 DS Tramadol 50mg   Last filled 02/14/21 30DS Depakote 250mg  last filled 06/01/2  15DS    Have you seen any other providers since your last  visit? Patients daughters denies her seeing any other providers outside of the [facility] network.  She has a NP appt with Dr Delice Lesch today.   Any changes in your medications or health?  Patients daughter reported she is taking a new medication.  She does not know the name of that but states she was prescribed this when she went to [facility] ED.  Per chart, the new medication is Depakote 250mg  take 1 twice daily.   Any side effects  from any medications?  Patients daughter reports she is having headaches from new medication (Depakote).  She has an appointment today with Dr. Delice Lesch with [facility] to discuss.   Do you have an symptoms or problems not managed by your medications?  Patients daughter reports she complains of left knee Drake and has a boil on her buttocks x 1 week.   Any concerns about your health right now?  Patients daughter is concerned about knee Drake, boil, and headaches.  Has your provider asked that you check blood pressure, blood sugar, or follow special diet at home?  Patients daughter monitors her BP 2-3 times per week, she was not at home during this call and did not have any values to report.   Do you get any type of exercise on a regular basis?  Patients exercise is limited to walking around in her house.  Can you think of a goal you would like to reach for your health?  No goal provided.   Do you have any problems getting your medications?  Patients daughter reports she uses Mail order and it is working out great.   Is there anything that you would like to discuss during the appointment?  Patients daughter did not have anything else to add.   Please bring medications and supplements to appointment  Appt confirmed 04/05/21  11am phone call  [place]

## 2021-04-01 ENCOUNTER — Encounter: Payer: Self-pay | Admitting: Neurology

## 2021-04-01 ENCOUNTER — Ambulatory Visit (INDEPENDENT_AMBULATORY_CARE_PROVIDER_SITE_OTHER): Payer: Medicare Other | Admitting: Neurology

## 2021-04-01 ENCOUNTER — Other Ambulatory Visit: Payer: Self-pay

## 2021-04-01 VITALS — BP 151/68 | HR 76 | Ht 63.0 in | Wt 236.0 lb

## 2021-04-01 DIAGNOSIS — F0391 Unspecified dementia with behavioral disturbance: Secondary | ICD-10-CM | POA: Diagnosis not present

## 2021-04-01 DIAGNOSIS — G40209 Localization-related (focal) (partial) symptomatic epilepsy and epileptic syndromes with complex partial seizures, not intractable, without status epilepticus: Secondary | ICD-10-CM

## 2021-04-01 DIAGNOSIS — F09 Unspecified mental disorder due to known physiological condition: Secondary | ICD-10-CM

## 2021-04-01 DIAGNOSIS — F03B18 Unspecified dementia, moderate, with other behavioral disturbance: Secondary | ICD-10-CM

## 2021-04-01 MED ORDER — LAMOTRIGINE 25 MG PO TABS: ORAL_TABLET | ORAL | 6 refills | Status: DC

## 2021-04-01 NOTE — Progress Notes (Signed)
NEUROLOGY CONSULTATION NOTE  [name] MRN: [id number] DOB: 09/13/1941  Referring provider: Dr. Dimas Chyle Primary care provider: Dr. Dimas Chyle  Reason for consult:  Memory loss  Dear Dr [name]:  Thank you for your kind referral of [name] for consultation of the above symptoms. Although her history is well known to you, please allow me to reiterate it for the purpose of our medical record. The patient was accompanied to the clinic by her daughter [name] who also provides collateral information. Records and images were personally reviewed where available.   HISTORY OF PRESENT ILLNESS: This is a 80 year old left-handed woman with a history of hypertension, hyperlipidemia, paranoid schizophrenia, presenting for evaluation of memory loss and new onset seizure. She is accompanied by her daughter [name] who helps supplement the history today. She was previously evaluated by neurologist Dr. Jannifer [place] in 2018 for memory loss that started around 2017. She felt something was different herself. MMSE 29/30 at that time. She was found to have bilateral subdural hematomas on brain MRI in 12/2016 after a fall. She had a burr hole and "went through hell and back." She got better to the point where she was doing things, putting her medications in her pillbox and taking them. [name] took over finances in 2018. In 2019, she became belligerent. She does not drive. She was started on Donepezil 5mg  daily. Memantine was started in 2019 but she had side effects of more confusion, insomnia. She was lost to follow-up but has continued to see her psychiatrist. Her daughter reports that "every January is when it happens." In January 2020, DSS called and told [name] she needed to get legal guardianship, she had been calling/threatening people and stopped her medications. Since then, [name] took over medications. That year she could still carry on a conversation. In 2021, it "all went away," she would ramble and  could not hold a thought. She started needing help with bathing. Last January 2022, she started ripping her shirt, blanket out of the blue. She called Dr. Adele Schilder and Donepezil was restarted which helped after a couple of days. She was also started on olanzapine. Since then, she has been much better, she is not ripping things anymore, family gives her fidget toys which helps. She has urinary incontinence and wears Depends. Rare bowel incontinence. She does not sleep more than 3 hours at a time, up at night and sleeping in the daytime. She is fidgety sometimes but not like in January.   She was admitted to [facility] last 03/26/21 after an episode of generalized shaking, transient unresponsiveness with tongue hanging out of her mouth, leaning to the left side. This lasted 15 seconds, they noticed left side was weaker after then improved. 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 Her EEG was abnormal with intermittent left frontal theta slowing, single spike in the left frontotemporal region. She was discharged home on Depakote 500mg  BID but started complaining of a headache after 3 doses. She mostly used to have headaches when her BP is high. She does not recall being admitted to the hospital. Her son lives with her, [name] is her legal guardian and stays the night. Family administers medications. Her mother and grandmother had dementia. No family history of seizures.    Laboratory Data: Lab Results  Component Value Date   WBC 7.2 03/27/2021   HGB 12.0 03/27/2021   HCT 36.0 03/27/2021   MCV 93.5 03/27/2021  PLT 194 03/27/2021     Chemistry      Component Value Date/Time   NA 142 03/29/2021 0632   NA 144 01/25/2021 0000   K 3.7 03/29/2021 0632   CL 105 03/29/2021 0632   CO2 27 03/29/2021 0632   BUN 21 03/29/2021 0632   BUN 18 01/25/2021 0000   CREATININE 1.64 (H) 03/29/2021 0632   CREATININE 1.43 (H) 09/22/2020 1528   GLU 110  01/25/2021 0000      Component Value Date/Time   CALCIUM 9.3 03/29/2021 0632   ALKPHOS 114 03/26/2021 0813   AST 35 03/26/2021 0813   ALT 16 03/26/2021 0813   BILITOT 1.3 (H) 03/26/2021 0813       PAST MEDICAL HISTORY: Past Medical History:  Diagnosis Date   Chronic venous insufficiency    Colon, diverticulosis Sept. 2011   outpatient colonoscopy by Dr. Cristina Gong.  Need record   DDD (degenerative disc disease), lumbosacral    Depression    DJD (degenerative joint disease), multiple sites    Low back pain worst   GERD (gastroesophageal reflux disease)    Gout    Gout    Uric Acid level 4.2 on 300 allopurinol   History of cervical cancer 1972   Hx-TIA (transient ischemic attack) 05/13/2001   Right facial numbness   Hx-TIA (transient ischemic attack) 05/13/2001   Looking back in Epic, the patient had a hospitalization in July 2002 for evaluation of questionable TIA (s/s of right facial numbness associated with mild blurred vision in her right eye, diaphoresis, and mild confusion) with negative CT head and negative MRI/MRA and was started on Plavix after being heparinized. She was to have possible follow up with Neurology for reevaluation of the need for Pl   Hyperlipidemia    Hypertension    Keloid 08/01/2018   Memory disorder 12/20/2016   Mitral valve prolapse 10/27/1999   Subsequent 2D echo show normal mitral valve   Osteoarthritis of hip    bilateral hips   Ovarian cancer ([place]) 1972   S/P oophorectomy   Psychosis ([place])    Recurrent boils    History of MRSA skin infections with abscess   Sensorineural hearing loss of both ears    Subdural hematoma ([place]) 01/26/2017   bilateral   Uncomplicated opioid dependence ([place]) 01/31/2018    PAST SURGICAL HISTORY: Past Surgical History:  Procedure Laterality Date   ABDOMINAL HYSTERECTOMY     1972   BURR HOLE Bilateral 02/12/2017   Procedure: Haskell Flirt;  Surgeon: Earnie Larsson, MD;  Location: [facility];  Service: Neurosurgery;  Laterality:  Bilateral;   CERVICAL DISCECTOMY  7/07   C5-C6   JOINT REPLACEMENT     bilateral hip replacement   LUMBAR DISC SURGERY     L5-S1 7/07   LUMBAR LAMINECTOMY/DECOMPRESSION MICRODISCECTOMY N/A 02/02/2016   Procedure: L4-5 Decompression;  Surgeon: Marybelle Killings, MD;  Location: [facility];  Service: Orthopedics;  Laterality: N/A;   OOPHORECTOMY     1972 for ovarian cancer    MEDICATIONS: Current Outpatient Medications on File Prior to Visit  Medication Sig Dispense Refill   acetaminophen (TYLENOL) 500 MG tablet Take 500 mg by mouth at bedtime.      albuterol (VENTOLIN HFA) 108 (90 Base) MCG/ACT inhaler Inhale 2 puffs into the lungs every 6 (six) hours as needed for wheezing or shortness of breath. 8 g 0   allopurinol (ZYLOPRIM) 300 MG tablet TAKE 1 TABLET BY MOUTH  DAILY (Patient taking differently: Take 300 mg  by mouth daily.) 90 tablet 3   amLODipine (NORVASC) 5 MG tablet Take 1 tablet (5 mg total) by mouth daily. 90 tablet 3   ascorbic acid (VITAMIN C) 500 MG tablet Take 500 mg by mouth daily.      atorvastatin (LIPITOR) 40 MG tablet TAKE 1 TABLET BY MOUTH  DAILY (Patient taking differently: Take 40 mg by mouth daily.) 90 tablet 3   budesonide (PULMICORT) 0.25 MG/2ML nebulizer solution Take 2 mLs (0.25 mg total) by nebulization 2 (two) times daily. 120 mL 2   buPROPion (WELLBUTRIN XL) 300 MG 24 hr tablet Take 1 tablet (300 mg total) by mouth every morning. 90 tablet 0   carvedilol (COREG) 3.125 MG tablet Take 1 tablet (3.125 mg total) by mouth 2 (two) times daily with a meal. 180 tablet 3   Cyanocobalamin (VITAMIN B12) 1000 MCG TBCR Take 1 tablet by mouth daily.     diclofenac Sodium (VOLTAREN) 1 % GEL Apply 4 g topically 4 (four) times daily as needed (for pain).     donepezil (ARICEPT) 5 MG tablet Take 1 tablet (5 mg total) by mouth at bedtime. 30 tablet 0   fluticasone (FLONASE) 50 MCG/ACT nasal spray Place into both nostrils as needed for allergies or rhinitis.     furosemide (LASIX) 80 MG  tablet Take 80 mg by mouth daily.     ipratropium-albuterol (DUONEB) 0.5-2.5 (3) MG/3ML SOLN Take 3 mLs by nebulization 2 (two) times daily. 360 mL 0   ketoconazole (NIZORAL) 2 % cream Apply 1 application topically 2 (two) times daily. 60 g 0   losartan (COZAAR) 100 MG tablet Take 1 tablet (100 mg total) by mouth daily. 90 tablet 3   nystatin (MYCOSTATIN/NYSTOP) powder Apply 1 application topically 3 (three) times daily. 60 g 0   OLANZapine (ZYPREXA) 2.5 MG tablet Take 1 tab at bedtime (Patient taking differently: Take 2.5 mg by mouth at bedtime. Take 1 tab at bedtime) 30 tablet 1   pantoprazole (PROTONIX) 40 MG tablet TAKE 1 TABLET BY MOUTH  TWICE DAILY BEFORE A MEAL (Patient taking differently: Take 40 mg by mouth 2 (two) times daily before a meal. TAKE 1 TABLET BY MOUTH  TWICE DAILY BEFORE A MEAL) 180 tablet 3   traMADol (ULTRAM) 50 MG tablet TAKE 1 TABLET BY MOUTH  TWICE DAILY AS NEEDED (Patient taking differently: Take 50 mg by mouth 2 (two) times daily. TAKE 1 TABLET BY MOUTH  TWICE DAILY AS NEEDED) 60 tablet 5   valproic acid (DEPAKENE) 250 MG capsule Take 2 capsules (500 mg total) by mouth 2 (two) times daily. 60 capsule 1   No current facility-administered medications on file prior to visit.    ALLERGIES: Allergies  Allergen Reactions   Aspirin Other (See Comments)    Bleeding from vagina    Sulfamethoxazole-Trimethoprim Itching    FAMILY HISTORY: Family History  Problem Relation Age of Onset   Diabetes Mother    Heart disease Mother    Hearing loss Mother    Stroke Mother    Post-traumatic stress disorder Father    Schizophrenia Maternal Grandmother    Diabetes Maternal Grandfather    Other Brother        brother died in mental health hospital   Kidney cancer Neg Hx    Bladder Cancer Neg Hx     SOCIAL HISTORY: Social History   Socioeconomic History   Marital status: Widowed    Spouse name: Not on file   Number of children: 3  Years of education: 15   Highest  education level: Not on file  Occupational History   Occupation: Retired  Tobacco Use   Smoking status: Former Smoker    Packs/day: 0.10    Years: 35.00    Pack years: 3.50    Types: Cigarettes    Quit date: 04/08/2013    Years since quitting: 7.9   Smokeless tobacco: Never Used   Tobacco comment: smoked a few when her son died in February 06, 2013   Vaping Use   Vaping Use: Never used  Substance and Sexual Activity   Alcohol use: No    Alcohol/week: 0.0 standard drinks   Drug use: No   Sexual activity: Never  Other Topics Concern   Not on file  Social History Narrative   Lives with daughter in [place]. Daughter is primary caretaker, surrogate decision-maker, and arranges for [facility] aide and other caregivers to supervise Ms. Carmack at home. Ambulatory   Social Determinants of Health   Financial Resource Strain: Not on file  Food Insecurity: Not on file  Transportation Needs: No Transportation Needs   Lack of Transportation (Medical): No   Lack of Transportation (Non-Medical): No  Physical Activity: Not on file  Stress: Not on file  Social Connections: Not on file  Intimate Partner Violence: Not on file     PHYSICAL EXAM: Vitals:   04/01/21 1308  BP: (!) 151/68  Pulse: 76  SpO2: 93%   General: No acute distress Head:  Normocephalic/atraumatic Skin/Extremities: No rash, no edema Neurological Exam: Mental status: alert and oriented to person, city/state. No dysarthria or aphasia, Fund of knowledge is reduced.  Recent and remote memory are impaired. Attention and concentration are reduced, able to spell WORLD, cannot spell backwards. Difficulty with naming, able to repeat. Unable to draw clock. MMSE 9/30 MMSE - Mini Mental State Exam 04/01/2021 08/01/2018 04/29/2018  Orientation to time 0 5 5  Orientation to Place 2 5 5   Registration 3 3 3   Attention/ Calculation 0 5 5  Recall 0 1 2  Language- name 2 objects 1 2 2   Language- repeat 1 1 1   Language- follow 3 step command 1 3 2    Language- read & follow direction 1 1 1   Write a sentence 0 1 1  Copy design 0 1 1  Total score 9 28 28     Cranial nerves: CN I: not tested CN II: pupils equal, round and reactive to light, visual fields intact CN III, IV, VI:  full range of motion, no nystagmus, no ptosis CN V: facial sensation intact CN VII: upper and lower face symmetric CN VIII: hearing intact to conversation CN XI: sternocleidomastoid and trapezius muscles intact CN XII: tongue midline Bulk & Tone: normal, no fasciculations. Motor: 5/5 throughout with no pronator drift. Sensation: intact to light touch, cold, pin, vibration sense.  No extinction to double simultaneous stimulation.   Deep Tendon Reflexes: +1 throughout Cerebellar: no incoordination on finger to nose testing Gait: slow and cautious, no ataxia Tremor: none   IMPRESSION: This is a 80 year old left-handed woman with a history of hypertension, hyperlipidemia, paranoid schizophrenia, presenting for evaluation of memory loss and new onset seizure. Her neurological exam is non-focal, MMSE today 9/30. She has had progressive cognitive decline since February 06, 2017 with behavioral changes on top of psychiatric history. There seemed to be some improvement in behaviors with initiation of Donepezil and olanzapine. She had a seizure last 03/26/21, MRI brain no acute changes. EEG showed occasional left frontal slowing  and a single spike in the left frontotemporal region. She had side effects on Depakote. We discussed starting a different seizure medication that also helps with mood stabilization, Lamotrigine. Side effects, including Kathreen Cosier syndrome were discussed. Start Lamotrigine 25mg  BID x 2 weeks, then increase to 2 tabs BID. Continue Donepezil 5mg  daily. Continue 24/7 care. She does not drive. Follow-up in 3 months, call for any changes.    Thank you for allowing me to participate in the care of this patient. Please do not hesitate to call for any questions or  concerns.   Ellouise Newer, M.D.  CC: Dr. [name]

## 2021-04-01 NOTE — Patient Instructions (Signed)
1. Start Lamotrigine 25mg : take 1 tablet twice a day for 2 weeks, then increase to 2 tablets twice a day  2. Continue Donepezil 5mg  daily  3. Continue 24/7 care  4. Follow-up in 3 months, call for any changes  Seizure Precautions: 1. If medication has been prescribed for you to prevent seizures, take it exactly as directed.  Do not stop taking the medicine without talking to your doctor first, even if you have not had a seizure in a long time.   2. Avoid activities in which a seizure would cause danger to yourself or to others.  Don't operate dangerous machinery, swim alone, or climb in high or dangerous places, such as on ladders, roofs, or girders.  Do not drive unless your doctor says you may.  3. If you have any warning that you may have a seizure, lay down in a safe place where you can't hurt yourself.    4.  No driving for 6 months from last seizure, as per [facility].   Please refer to the following link on the [place] website for more information: http://[email or web address]/[email or web address]/driving/drivingu.cfm   5.  Maintain good sleep hygiene.   6.  Contact your doctor if you have any problems that may be related to the medicine you are taking.  7.  Call 911 and bring the patient back to the ED if:        A.  The seizure lasts longer than 5 minutes.       B.  The patient doesn't awaken shortly after the seizure  C.  The patient has new problems such as difficulty seeing, speaking or moving  D.  The patient was injured during the seizure  E.  The patient has a temperature over 102 F (39C)  F.  The patient vomited and now is having trouble breathing

## 2021-04-02 DIAGNOSIS — G459 Transient cerebral ischemic attack, unspecified: Secondary | ICD-10-CM | POA: Diagnosis not present

## 2021-04-02 DIAGNOSIS — E785 Hyperlipidemia, unspecified: Secondary | ICD-10-CM | POA: Diagnosis not present

## 2021-04-02 DIAGNOSIS — J449 Chronic obstructive pulmonary disease, unspecified: Secondary | ICD-10-CM | POA: Diagnosis not present

## 2021-04-02 DIAGNOSIS — I2510 Atherosclerotic heart disease of native coronary artery without angina pectoris: Secondary | ICD-10-CM | POA: Diagnosis not present

## 2021-04-02 DIAGNOSIS — F32A Depression, unspecified: Secondary | ICD-10-CM | POA: Diagnosis not present

## 2021-04-02 DIAGNOSIS — I5033 Acute on chronic diastolic (congestive) heart failure: Secondary | ICD-10-CM | POA: Diagnosis not present

## 2021-04-02 DIAGNOSIS — I700 Atherosclerosis of aorta: Secondary | ICD-10-CM | POA: Diagnosis not present

## 2021-04-02 DIAGNOSIS — I872 Venous insufficiency (chronic) (peripheral): Secondary | ICD-10-CM | POA: Diagnosis not present

## 2021-04-02 DIAGNOSIS — N183 Chronic kidney disease, stage 3 unspecified: Secondary | ICD-10-CM | POA: Diagnosis not present

## 2021-04-02 DIAGNOSIS — I130 Hypertensive heart and chronic kidney disease with heart failure and stage 1 through stage 4 chronic kidney disease, or unspecified chronic kidney disease: Secondary | ICD-10-CM | POA: Diagnosis not present

## 2021-04-03 DIAGNOSIS — J449 Chronic obstructive pulmonary disease, unspecified: Secondary | ICD-10-CM | POA: Diagnosis not present

## 2021-04-03 DIAGNOSIS — I130 Hypertensive heart and chronic kidney disease with heart failure and stage 1 through stage 4 chronic kidney disease, or unspecified chronic kidney disease: Secondary | ICD-10-CM | POA: Diagnosis not present

## 2021-04-03 DIAGNOSIS — F32A Depression, unspecified: Secondary | ICD-10-CM | POA: Diagnosis not present

## 2021-04-03 DIAGNOSIS — I2510 Atherosclerotic heart disease of native coronary artery without angina pectoris: Secondary | ICD-10-CM | POA: Diagnosis not present

## 2021-04-03 DIAGNOSIS — I5033 Acute on chronic diastolic (congestive) heart failure: Secondary | ICD-10-CM | POA: Diagnosis not present

## 2021-04-03 DIAGNOSIS — E785 Hyperlipidemia, unspecified: Secondary | ICD-10-CM | POA: Diagnosis not present

## 2021-04-03 DIAGNOSIS — I872 Venous insufficiency (chronic) (peripheral): Secondary | ICD-10-CM | POA: Diagnosis not present

## 2021-04-03 DIAGNOSIS — I700 Atherosclerosis of aorta: Secondary | ICD-10-CM | POA: Diagnosis not present

## 2021-04-03 DIAGNOSIS — N183 Chronic kidney disease, stage 3 unspecified: Secondary | ICD-10-CM | POA: Diagnosis not present

## 2021-04-03 DIAGNOSIS — G459 Transient cerebral ischemic attack, unspecified: Secondary | ICD-10-CM | POA: Diagnosis not present

## 2021-04-04 ENCOUNTER — Telehealth: Payer: Self-pay

## 2021-04-04 ENCOUNTER — Inpatient Hospital Stay: Payer: Medicare Other | Admitting: Family Medicine

## 2021-04-04 NOTE — Telephone Encounter (Signed)
Home Health Verbal Orders  Agency:  [facility]   Requesting Skilled nursing    Frequency:  Once a week for 4 weeks.

## 2021-04-04 NOTE — Telephone Encounter (Signed)
Ok for verbal order  °

## 2021-04-05 ENCOUNTER — Telehealth: Payer: Self-pay | Admitting: Neurology

## 2021-04-05 ENCOUNTER — Telehealth: Payer: Medicare Other

## 2021-04-05 ENCOUNTER — Telehealth: Payer: Self-pay

## 2021-04-05 DIAGNOSIS — J449 Chronic obstructive pulmonary disease, unspecified: Secondary | ICD-10-CM | POA: Diagnosis not present

## 2021-04-05 DIAGNOSIS — I700 Atherosclerosis of aorta: Secondary | ICD-10-CM | POA: Diagnosis not present

## 2021-04-05 DIAGNOSIS — E785 Hyperlipidemia, unspecified: Secondary | ICD-10-CM | POA: Diagnosis not present

## 2021-04-05 DIAGNOSIS — I872 Venous insufficiency (chronic) (peripheral): Secondary | ICD-10-CM | POA: Diagnosis not present

## 2021-04-05 DIAGNOSIS — N183 Chronic kidney disease, stage 3 unspecified: Secondary | ICD-10-CM | POA: Diagnosis not present

## 2021-04-05 DIAGNOSIS — I5033 Acute on chronic diastolic (congestive) heart failure: Secondary | ICD-10-CM | POA: Diagnosis not present

## 2021-04-05 DIAGNOSIS — I2510 Atherosclerotic heart disease of native coronary artery without angina pectoris: Secondary | ICD-10-CM | POA: Diagnosis not present

## 2021-04-05 DIAGNOSIS — F32A Depression, unspecified: Secondary | ICD-10-CM | POA: Diagnosis not present

## 2021-04-05 DIAGNOSIS — I130 Hypertensive heart and chronic kidney disease with heart failure and stage 1 through stage 4 chronic kidney disease, or unspecified chronic kidney disease: Secondary | ICD-10-CM | POA: Diagnosis not present

## 2021-04-05 DIAGNOSIS — G459 Transient cerebral ischemic attack, unspecified: Secondary | ICD-10-CM | POA: Diagnosis not present

## 2021-04-05 NOTE — Telephone Encounter (Signed)
Pls let her know to stop the Aricept and start the Lamotrigine to help with the seizures. Would start one medication at a time, see how she feels on the Lamotrigine and we can start a different memory medication once seizures are better controlled. thanks

## 2021-04-05 NOTE — Telephone Encounter (Signed)
Patient's daughter [name] called with concerns about the patient's donepezil. She said it has a side effect of causing seizures and she has had a seizure while on it.  She'd like to know if there is another medication that will work the same for memory and not have a side effect of seizures?  She is questioning starting the patient on lamotrigine for seizures if they are caused by a side effect of a medicine.  She'd like a call from a nurse about this.  [facility] on [address]

## 2021-04-05 NOTE — Telephone Encounter (Signed)
  Chronic Care Management   Outreach Note   Name: [name] MRN: [id number] DOB: 03-Jan-1941  Referred by: Vivi Barrack, MD Reason for referral: Telephone Appointment with [facility] Pharmacist, Madelin Rear.   An unsuccessful telephone outreach was attempted today. The patient was referred to the pharmacist for assistance with care management and care coordination.   Telephone appointment with clinical pharmacist today (04/05/2021) at 11am. If patient immediately returns call, transfer to [phone]. Otherwise, please provide this number so patient can reschedule visit.   Madelin Rear, Pharm.D., BCGP Clinical Pharmacist [facility] [phone]

## 2021-04-05 NOTE — Telephone Encounter (Signed)
Pt daughter stated that she will stop the aricept but she thinks her mom needs to be started on something else for her memory that is not as invasive. She stated at she will not start the lamotrigine until she talks to Dr Delice Lesch. She is asking for Dr Delice Lesch to call her or for a VV she stated that the her mom dose not have seizures and that if they stop the aricept and the other med from the psychiatrist she should not have any more seizures.  Pt daughter stated that both meds where started via phone at the same time and both can cause seizures and that no MD followed up on it, and at the time she did not do any research on it and at her appointment with Dr Delice Lesch she was not aware of the side effects to ask Dr Delice Lesch to ask.

## 2021-04-08 DIAGNOSIS — J449 Chronic obstructive pulmonary disease, unspecified: Secondary | ICD-10-CM | POA: Diagnosis not present

## 2021-04-08 DIAGNOSIS — I5033 Acute on chronic diastolic (congestive) heart failure: Secondary | ICD-10-CM | POA: Diagnosis not present

## 2021-04-08 DIAGNOSIS — G459 Transient cerebral ischemic attack, unspecified: Secondary | ICD-10-CM | POA: Diagnosis not present

## 2021-04-08 DIAGNOSIS — I130 Hypertensive heart and chronic kidney disease with heart failure and stage 1 through stage 4 chronic kidney disease, or unspecified chronic kidney disease: Secondary | ICD-10-CM | POA: Diagnosis not present

## 2021-04-08 DIAGNOSIS — I2510 Atherosclerotic heart disease of native coronary artery without angina pectoris: Secondary | ICD-10-CM | POA: Diagnosis not present

## 2021-04-08 DIAGNOSIS — I700 Atherosclerosis of aorta: Secondary | ICD-10-CM | POA: Diagnosis not present

## 2021-04-08 DIAGNOSIS — F32A Depression, unspecified: Secondary | ICD-10-CM | POA: Diagnosis not present

## 2021-04-08 DIAGNOSIS — I872 Venous insufficiency (chronic) (peripheral): Secondary | ICD-10-CM | POA: Diagnosis not present

## 2021-04-08 DIAGNOSIS — E785 Hyperlipidemia, unspecified: Secondary | ICD-10-CM | POA: Diagnosis not present

## 2021-04-08 DIAGNOSIS — N183 Chronic kidney disease, stage 3 unspecified: Secondary | ICD-10-CM | POA: Diagnosis not present

## 2021-04-08 NOTE — Telephone Encounter (Signed)
Spoke to daughter about concerns. She had spoken as well to the patient's home health nurse and understood the rationale for her medications. She has continued the Donepezil, olanzapine, and started the Lamotrigine. Discussed rationale as well for staying on Donepezil and starting Lamotrigine. She expressed understanding.

## 2021-04-12 DIAGNOSIS — J449 Chronic obstructive pulmonary disease, unspecified: Secondary | ICD-10-CM | POA: Diagnosis not present

## 2021-04-12 DIAGNOSIS — G459 Transient cerebral ischemic attack, unspecified: Secondary | ICD-10-CM | POA: Diagnosis not present

## 2021-04-12 DIAGNOSIS — N183 Chronic kidney disease, stage 3 unspecified: Secondary | ICD-10-CM | POA: Diagnosis not present

## 2021-04-12 DIAGNOSIS — I872 Venous insufficiency (chronic) (peripheral): Secondary | ICD-10-CM | POA: Diagnosis not present

## 2021-04-12 DIAGNOSIS — F32A Depression, unspecified: Secondary | ICD-10-CM | POA: Diagnosis not present

## 2021-04-12 DIAGNOSIS — I130 Hypertensive heart and chronic kidney disease with heart failure and stage 1 through stage 4 chronic kidney disease, or unspecified chronic kidney disease: Secondary | ICD-10-CM | POA: Diagnosis not present

## 2021-04-12 DIAGNOSIS — I700 Atherosclerosis of aorta: Secondary | ICD-10-CM | POA: Diagnosis not present

## 2021-04-12 DIAGNOSIS — E785 Hyperlipidemia, unspecified: Secondary | ICD-10-CM | POA: Diagnosis not present

## 2021-04-12 DIAGNOSIS — I2510 Atherosclerotic heart disease of native coronary artery without angina pectoris: Secondary | ICD-10-CM | POA: Diagnosis not present

## 2021-04-12 DIAGNOSIS — I5033 Acute on chronic diastolic (congestive) heart failure: Secondary | ICD-10-CM | POA: Diagnosis not present

## 2021-04-14 ENCOUNTER — Other Ambulatory Visit: Payer: Self-pay

## 2021-04-14 ENCOUNTER — Ambulatory Visit (INDEPENDENT_AMBULATORY_CARE_PROVIDER_SITE_OTHER): Payer: Medicare Other | Admitting: Psychiatry

## 2021-04-14 ENCOUNTER — Encounter (HOSPITAL_COMMUNITY): Payer: Self-pay | Admitting: Psychiatry

## 2021-04-14 DIAGNOSIS — N183 Chronic kidney disease, stage 3 unspecified: Secondary | ICD-10-CM | POA: Diagnosis not present

## 2021-04-14 DIAGNOSIS — F200 Paranoid schizophrenia: Secondary | ICD-10-CM | POA: Diagnosis not present

## 2021-04-14 DIAGNOSIS — I872 Venous insufficiency (chronic) (peripheral): Secondary | ICD-10-CM | POA: Diagnosis not present

## 2021-04-14 DIAGNOSIS — G459 Transient cerebral ischemic attack, unspecified: Secondary | ICD-10-CM | POA: Diagnosis not present

## 2021-04-14 DIAGNOSIS — F419 Anxiety disorder, unspecified: Secondary | ICD-10-CM | POA: Diagnosis not present

## 2021-04-14 DIAGNOSIS — I5033 Acute on chronic diastolic (congestive) heart failure: Secondary | ICD-10-CM | POA: Diagnosis not present

## 2021-04-14 DIAGNOSIS — E785 Hyperlipidemia, unspecified: Secondary | ICD-10-CM | POA: Diagnosis not present

## 2021-04-14 DIAGNOSIS — F32A Depression, unspecified: Secondary | ICD-10-CM | POA: Diagnosis not present

## 2021-04-14 DIAGNOSIS — I130 Hypertensive heart and chronic kidney disease with heart failure and stage 1 through stage 4 chronic kidney disease, or unspecified chronic kidney disease: Secondary | ICD-10-CM | POA: Diagnosis not present

## 2021-04-14 DIAGNOSIS — J449 Chronic obstructive pulmonary disease, unspecified: Secondary | ICD-10-CM | POA: Diagnosis not present

## 2021-04-14 DIAGNOSIS — I2510 Atherosclerotic heart disease of native coronary artery without angina pectoris: Secondary | ICD-10-CM | POA: Diagnosis not present

## 2021-04-14 DIAGNOSIS — I700 Atherosclerosis of aorta: Secondary | ICD-10-CM | POA: Diagnosis not present

## 2021-04-14 MED ORDER — OLANZAPINE 2.5 MG PO TABS
ORAL_TABLET | ORAL | 0 refills | Status: DC
Start: 2021-04-14 — End: 2021-07-13

## 2021-04-14 MED ORDER — BUPROPION HCL ER (XL) 300 MG PO TB24: 300.0000 mg | ORAL_TABLET | Freq: Every morning | ORAL | 0 refills | Status: DC
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
## 2021-04-16 DIAGNOSIS — G459 Transient cerebral ischemic attack, unspecified: Secondary | ICD-10-CM | POA: Diagnosis not present

## 2021-04-16 DIAGNOSIS — I2510 Atherosclerotic heart disease of native coronary artery without angina pectoris: Secondary | ICD-10-CM | POA: Diagnosis not present

## 2021-04-16 DIAGNOSIS — E785 Hyperlipidemia, unspecified: Secondary | ICD-10-CM | POA: Diagnosis not present

## 2021-04-16 DIAGNOSIS — I700 Atherosclerosis of aorta: Secondary | ICD-10-CM | POA: Diagnosis not present

## 2021-04-16 DIAGNOSIS — I872 Venous insufficiency (chronic) (peripheral): Secondary | ICD-10-CM | POA: Diagnosis not present

## 2021-04-16 DIAGNOSIS — N183 Chronic kidney disease, stage 3 unspecified: Secondary | ICD-10-CM | POA: Diagnosis not present

## 2021-04-16 DIAGNOSIS — J449 Chronic obstructive pulmonary disease, unspecified: Secondary | ICD-10-CM | POA: Diagnosis not present

## 2021-04-16 DIAGNOSIS — I5033 Acute on chronic diastolic (congestive) heart failure: Secondary | ICD-10-CM | POA: Diagnosis not present

## 2021-04-16 DIAGNOSIS — I130 Hypertensive heart and chronic kidney disease with heart failure and stage 1 through stage 4 chronic kidney disease, or unspecified chronic kidney disease: Secondary | ICD-10-CM | POA: Diagnosis not present

## 2021-04-16 DIAGNOSIS — F32A Depression, unspecified: Secondary | ICD-10-CM | POA: Diagnosis not present

## 2021-04-19 DIAGNOSIS — I2510 Atherosclerotic heart disease of native coronary artery without angina pectoris: Secondary | ICD-10-CM | POA: Diagnosis not present

## 2021-04-19 DIAGNOSIS — G459 Transient cerebral ischemic attack, unspecified: Secondary | ICD-10-CM | POA: Diagnosis not present

## 2021-04-19 DIAGNOSIS — I5033 Acute on chronic diastolic (congestive) heart failure: Secondary | ICD-10-CM | POA: Diagnosis not present

## 2021-04-19 DIAGNOSIS — I130 Hypertensive heart and chronic kidney disease with heart failure and stage 1 through stage 4 chronic kidney disease, or unspecified chronic kidney disease: Secondary | ICD-10-CM | POA: Diagnosis not present

## 2021-04-19 DIAGNOSIS — I872 Venous insufficiency (chronic) (peripheral): Secondary | ICD-10-CM | POA: Diagnosis not present

## 2021-04-19 DIAGNOSIS — J449 Chronic obstructive pulmonary disease, unspecified: Secondary | ICD-10-CM | POA: Diagnosis not present

## 2021-04-19 DIAGNOSIS — I700 Atherosclerosis of aorta: Secondary | ICD-10-CM | POA: Diagnosis not present

## 2021-04-19 DIAGNOSIS — E785 Hyperlipidemia, unspecified: Secondary | ICD-10-CM | POA: Diagnosis not present

## 2021-04-19 DIAGNOSIS — N183 Chronic kidney disease, stage 3 unspecified: Secondary | ICD-10-CM | POA: Diagnosis not present

## 2021-04-19 DIAGNOSIS — F32A Depression, unspecified: Secondary | ICD-10-CM | POA: Diagnosis not present

## 2021-04-21 DIAGNOSIS — I700 Atherosclerosis of aorta: Secondary | ICD-10-CM | POA: Diagnosis not present

## 2021-04-21 DIAGNOSIS — I2510 Atherosclerotic heart disease of native coronary artery without angina pectoris: Secondary | ICD-10-CM | POA: Diagnosis not present

## 2021-04-21 DIAGNOSIS — N183 Chronic kidney disease, stage 3 unspecified: Secondary | ICD-10-CM | POA: Diagnosis not present

## 2021-04-21 DIAGNOSIS — F32A Depression, unspecified: Secondary | ICD-10-CM | POA: Diagnosis not present

## 2021-04-21 DIAGNOSIS — E785 Hyperlipidemia, unspecified: Secondary | ICD-10-CM | POA: Diagnosis not present

## 2021-04-21 DIAGNOSIS — I872 Venous insufficiency (chronic) (peripheral): Secondary | ICD-10-CM | POA: Diagnosis not present

## 2021-04-21 DIAGNOSIS — G459 Transient cerebral ischemic attack, unspecified: Secondary | ICD-10-CM | POA: Diagnosis not present

## 2021-04-21 DIAGNOSIS — I5033 Acute on chronic diastolic (congestive) heart failure: Secondary | ICD-10-CM | POA: Diagnosis not present

## 2021-04-21 DIAGNOSIS — J449 Chronic obstructive pulmonary disease, unspecified: Secondary | ICD-10-CM | POA: Diagnosis not present

## 2021-04-21 DIAGNOSIS — I130 Hypertensive heart and chronic kidney disease with heart failure and stage 1 through stage 4 chronic kidney disease, or unspecified chronic kidney disease: Secondary | ICD-10-CM | POA: Diagnosis not present

## 2021-04-25 ENCOUNTER — Ambulatory Visit (INDEPENDENT_AMBULATORY_CARE_PROVIDER_SITE_OTHER): Payer: Medicare Other

## 2021-04-25 ENCOUNTER — Telehealth: Payer: Self-pay | Admitting: Neurology

## 2021-04-25 ENCOUNTER — Telehealth: Payer: Medicare Other

## 2021-04-25 DIAGNOSIS — I5032 Chronic diastolic (congestive) heart failure: Secondary | ICD-10-CM

## 2021-04-25 DIAGNOSIS — G459 Transient cerebral ischemic attack, unspecified: Secondary | ICD-10-CM | POA: Diagnosis not present

## 2021-04-25 DIAGNOSIS — I2510 Atherosclerotic heart disease of native coronary artery without angina pectoris: Secondary | ICD-10-CM | POA: Diagnosis not present

## 2021-04-25 DIAGNOSIS — F32A Depression, unspecified: Secondary | ICD-10-CM | POA: Diagnosis not present

## 2021-04-25 DIAGNOSIS — N183 Chronic kidney disease, stage 3 unspecified: Secondary | ICD-10-CM | POA: Diagnosis not present

## 2021-04-25 DIAGNOSIS — I10 Essential (primary) hypertension: Secondary | ICD-10-CM | POA: Diagnosis not present

## 2021-04-25 DIAGNOSIS — I700 Atherosclerosis of aorta: Secondary | ICD-10-CM | POA: Diagnosis not present

## 2021-04-25 DIAGNOSIS — K219 Gastro-esophageal reflux disease without esophagitis: Secondary | ICD-10-CM

## 2021-04-25 DIAGNOSIS — I872 Venous insufficiency (chronic) (peripheral): Secondary | ICD-10-CM | POA: Diagnosis not present

## 2021-04-25 DIAGNOSIS — R2681 Unsteadiness on feet: Secondary | ICD-10-CM | POA: Diagnosis not present

## 2021-04-25 DIAGNOSIS — E785 Hyperlipidemia, unspecified: Secondary | ICD-10-CM | POA: Diagnosis not present

## 2021-04-25 DIAGNOSIS — M25559 Pain in unspecified hip: Secondary | ICD-10-CM | POA: Diagnosis not present

## 2021-04-25 DIAGNOSIS — I509 Heart failure, unspecified: Secondary | ICD-10-CM | POA: Diagnosis not present

## 2021-04-25 DIAGNOSIS — I5033 Acute on chronic diastolic (congestive) heart failure: Secondary | ICD-10-CM | POA: Diagnosis not present

## 2021-04-25 DIAGNOSIS — F09 Unspecified mental disorder due to known physiological condition: Secondary | ICD-10-CM

## 2021-04-25 DIAGNOSIS — I130 Hypertensive heart and chronic kidney disease with heart failure and stage 1 through stage 4 chronic kidney disease, or unspecified chronic kidney disease: Secondary | ICD-10-CM | POA: Diagnosis not present

## 2021-04-25 DIAGNOSIS — R269 Unspecified abnormalities of gait and mobility: Secondary | ICD-10-CM | POA: Diagnosis not present

## 2021-04-25 DIAGNOSIS — J449 Chronic obstructive pulmonary disease, unspecified: Secondary | ICD-10-CM | POA: Diagnosis not present

## 2021-04-25 NOTE — Telephone Encounter (Signed)
Pt daughter called in and said she never got the rx  for donepezil to [facility]. Said she may be able to get it refilled at her local pharmacy since it would be a new medication. [phone] daughter number

## 2021-04-25 NOTE — Progress Notes (Signed)
Chronic Care Management Pharmacy Note  04/25/2021 Name:  [name] MRN:  [id number] DOB:  12-17-1940  Recommendations/Changes made from today's visit: No Rx changes  Subjective: [name] is an 80 y.o. year old female who is a primary patient of Vivi Barrack, MD.  The CCM team was consulted for assistance with disease management and care coordination needs.  Spoke with patient's daughter, [name], who helps with medication management.   Engaged with patient by telephone for initial visit in response to provider referral for pharmacy case management and/or care coordination services.   Consent to Services:  The patient was given information about Chronic Care Management services, agreed to services, and gave verbal consent prior to initiation of services.  Please see initial visit note for detailed documentation.   Patient Care Team: Vivi Barrack, MD as PCP - General (Family Medicine) Freada Bergeron, MD as PCP - Cardiology (Cardiology) Kathlee Nations, MD as Consulting Physician (Psychiatry) Darleen Crocker, MD as Consulting Physician (Ophthalmology) Marybelle Killings, MD as Consulting Physician (Orthopedic Surgery) Madelin Rear, [facility] as Pharmacist (Pharmacist) Recent office visits:  02/28/21-Caleb Parker MD (PCP)- Office visit for management of chronic conditions. Labs completed. DISCONTINUED Hydrodyzine 71m.  Follow up in 6 months.  01/20/21-Caleb PJerline PainMD (PCP)- Oiffice visit for rash. [facility]. STARTED Fluconazole 1527mtake 1 once; STARTED Ketoconazole 2% 1 application topically twice daily and Nystatin [id number]/gm 1 application topically three times daily.   Recent consult visits:  03/25/21-Heather PeJohney FrameD (Cardiology)- Office visit for heart failure.No medication changes. Recommended compression wraps to the lower extremities with leg elevation, Monitor daily weights, Low Na diet.  Follow up in 3  months. 02/08/21-Syed Arfeen MD (Behavioral Health)-info unavailable without breaking the glass 01/12/21-Syed Arfeen MD (Behavioral Health)-info unavailable without breaking the glass 01/03/21-Heather Pemberton MD (Cardiology)- Telephone note- Recommended patient to use cough drops or OTC cough suppressants, use Zyrtec or Claritin. 12/31/20-Heather PeJohney FrameD (Cardiology)- Office visit for CHF. DECREASED Amlodipine from 1058maily to 5mg45mke 1 daily.  DECREASED Carvedilol from 6.25mg24mer day to Carvedilol 3.125mg 98mr day with meals.  Patient reported not taking Cefpodoxime Proxetil 100mg a40metolazone 5mg. Fo15mw up in 2 months. 12/24/20-Heather [facility])-Telephone note- Dr PembertoJohney Framended switching Torsemide to 20mg eve27m,W, F and 40mg ever23m Thur, Sat, Sunday and repeat BMET in 14days. Patient's daughter reported she is no longer taking Torsemide and is on Lasix 80mg per d11m 12/08/20- [facility])-Data unavailable 12/01/20-Syed Arfeen MD (Psychiatry)data unavailable 11/30/20- [facility])-Data unavailable 10/20/21-Heather Pemberton [facility])-No medication changes.  Labs completed. Follow up in 1 month 10/07/21-Heather Pemberton [facility])-Office visit for CHF, Labs completed.  STARTED Torsemide 40mg take 181mly. INCREASED Carvedilol from 3.125 take 1 twice daily to Carvedilol 6.25mg take 1 72me daily.  DISCONTINUED Furosemide 40mg take 1 d65m.  Followup in 2 weeks.    Hospital visits:  #2        Medication Reconciliation was completed by comparing discharge summary, patient's EMR and Pharmacy list, and upon discussion with patient.   Admitted to the hospital on 03/26/21 due to TIA. Discharge date was 03/29/21. [facility] Depakote 250mg take 1 tw36mdaily due to seizure like activity   Medication Changes at Hospital  Discharge: -Changed None Medications Discontinued at Hospital Discharge: -Stopped None   Medications that remain the same after Hospital Discharge:??  -  All other medications will remain the same.       #1        Medication Reconciliation was completed by comparing discharge summary, patient's EMR and Pharmacy list, and upon discussion with patient.   Admitted to the hospital on 10/13/21 due to chest pain. Discharge date was 10/14/21. Discharged from [place]?Medications Started at [facility] Discharge:?? -started Lasix in the ED   Medication Changes at Hospital Discharge: -Changed None Medications Discontinued at Hospital Discharge: -Stopped None   Medications that remain the same after Hospital Discharge:??  -All other medications will remain the same.     Objective:  Lab Results  Component Value Date   CREATININE 1.64 (H) 03/29/2021   CREATININE 1.42 (H) 03/28/2021   CREATININE 1.41 (H) 03/27/2021    Lab Results  Component Value Date   HGBA1C 6.7 (H) 03/27/2021   Last diabetic Eye exam: No results found for: HMDIABEYEEXA  Last diabetic Foot exam: No results found for: HMDIABFOOTEX      Component Value Date/Time   CHOL 189 03/27/2021 0535   TRIG 134 03/27/2021 0535   HDL 36 (L) 03/27/2021 0535   CHOLHDL 5.3 03/27/2021 0535   VLDL 27 03/27/2021 0535   LDLCALC 126 (H) 03/27/2021 0535   LDLCALC 137 (H) 08/30/2020 1546    Hepatic Function Latest Ref Rng & Units 03/26/2021 02/28/2021 01/25/2021  Total Protein 6.5 - 8.1 g/dL 7.9 7.0 -  Albumin 3.5 - 5.0 g/dL 4.0 4.2 4.5  AST 15 - 41 U/L 35 14 -  ALT 0 - 44 U/L 16 16 -  Alk Phosphatase 38 - 126 U/L 114 120(H) -  Total Bilirubin 0.3 - 1.2 mg/dL 1.3(H) 0.4 -  Bilirubin, Direct (0.0-0.3) mg/dL - - -    Lab Results  Component Value Date/Time   TSH 4.82 (H) 02/28/2021 03:03 PM   TSH 4.62 (H) 08/30/2020 03:46 PM   FREET4 1.04 05/26/2011 02:51 PM   FREET4 1.06 05/27/2010 05:05 PM    CBC  Latest Ref Rng & Units 03/27/2021 03/26/2021 02/28/2021  WBC 4.0 - 10.5 K/uL 7.2 8.4 7.3  Hemoglobin 12.0 - 15.0 g/dL 12.0 12.7 12.8  Hematocrit 36.0 - 46.0 % 36.0 39.0 38.7  Platelets 150 - 400 K/uL 194 208 184.0    No results found for: VD25OH  Clinical ASCVD: Yes  The 10-year ASCVD risk score Mikey Bussing DC Jr., et al., 12/31/11) is: 15%   Values used to calculate the score:     Age: 43 years     Sex: Female     Is Non-Hispanic African American: Yes     Diabetic: No     Tobacco smoker: No     Systolic Blood Pressure: 540 mmHg     Is BP treated: Yes     HDL Cholesterol: 36 mg/dL     Total Cholesterol: 189 mg/dL    Social History   Tobacco Use  Smoking Status Former   Packs/day: 0.10   Years: 35.00   Pack years: 3.50   Types: Cigarettes   Quit date: 04/08/2013   Years since quitting: 8.0  Smokeless Tobacco Never  Tobacco Comments   smoked a few when her son died in 12-30-12    BP Readings from Last 3 Encounters:  04/14/21 139/75  04/01/21 (!) 151/68  03/29/21 111/65   Pulse Readings from Last 3 Encounters:  04/14/21 66  04/01/21 76  03/29/21 69   Wt Readings from Last 3 Encounters:  04/01/21 236 lb (107 kg)  03/28/21 270 lb 11.6 oz (122.8 kg)  03/25/21 265 lb 6.4 oz (120.4 kg)    Assessment: Review of patient past medical history, allergies, medications, health status, including review of consultants reports, laboratory and other test data, was performed as part of comprehensive evaluation and provision of chronic care management services.   SDOH:  (Social Determinants of Health) assessments and interventions performed: Yes  CCM Care Plan  Allergies  Allergen Reactions   Aspirin Other (See Comments)    Bleeding from vagina    Sulfamethoxazole-Trimethoprim Itching    Medications Reviewed Today     Reviewed by Madelin Rear, [facility] (Pharmacist) on 04/25/21 at [place] List Status: <None>   Medication Order Taking? Sig Documenting Provider Last Dose Status Informant   acetaminophen (TYLENOL) 500 MG tablet [id number] Yes Take 500 mg by mouth at bedtime.  [provider]  Active Child  albuterol (VENTOLIN HFA) 108 (90 Base) MCG/ACT inhaler [id number]  Inhale 2 puffs into the lungs every 6 (six) hours as needed for wheezing or shortness of breath. Vivi Barrack, MD  Active Child  allopurinol (ZYLOPRIM) 300 MG tablet [id number] Yes TAKE 1 TABLET BY MOUTH  DAILY  Patient taking differently: Take 300 mg by mouth daily.   Vivi Barrack, MD Taking Active   amLODipine (NORVASC) 5 MG tablet [id number] Yes Take 1 tablet (5 mg total) by mouth daily. Freada Bergeron, MD  Active Child  ascorbic acid (VITAMIN C) 500 MG tablet [id number]  Take 500 mg by mouth daily.  [provider]  Active Child  atorvastatin (LIPITOR) 40 MG tablet [id number] Yes TAKE 1 TABLET BY MOUTH  DAILY  Patient taking differently: Take 40 mg by mouth daily.   Vivi Barrack, MD Taking Active   budesonide (PULMICORT) 0.25 MG/2ML nebulizer solution [id number] Yes Take 2 mLs (0.25 mg total) by nebulization 2 (two) times daily. Freada Bergeron, MD Taking Active Child  buPROPion (WELLBUTRIN XL) 300 MG 24 hr tablet [id number] Yes Take 1 tablet (300 mg total) by mouth every morning. Kathlee Nations, MD Taking Active   carvedilol (COREG) 3.125 MG tablet [id number] Yes Take 1 tablet (3.125 mg total) by mouth 2 (two) times daily with a meal. Freada Bergeron, MD Taking Active Child  Cyanocobalamin (VITAMIN B12) 1000 MCG TBCR [id number]  Take 1 tablet by mouth daily. [provider]  Active Child  diclofenac Sodium (VOLTAREN) 1 % GEL [id number]  Apply 4 g topically 4 (four) times daily as needed (for pain). [provider]  Active Child  donepezil (ARICEPT) 5 MG tablet [id number] Yes Take 1 tablet (5 mg total) by mouth at bedtime. Kathlee Nations, MD  Active Child  fluticasone [facility]) 50 MCG/ACT nasal spray [id number]  Place into both nostrils as needed for allergies or  rhinitis. [provider]  Active Child  furosemide (LASIX) 80 MG tablet [id number] Yes Take 80 mg by mouth daily. Claudia Desanctis, MD  Active Child  ipratropium-albuterol (DUONEB) 0.5-2.5 (3) MG/3ML SOLN [id number]  Take 3 mLs by nebulization 2 (two) times daily. Swayze, Ava, DO  Active Child  ketoconazole (NIZORAL) 2 % cream [id number]  Apply 1 application topically 2 (two) times daily. Vivi Barrack, MD  Active Child  lamoTRIgine (LAMICTAL) 25 MG tablet [id number] Yes Take tablet twice a day for 2 weeks, then increase to 2 tablets twice a day Cameron Sprang, MD  Active   losartan (COZAAR) 100 MG tablet [id number]  Yes Take 1 tablet (100 mg total) by mouth daily. Vivi Barrack, MD  Active Child  nystatin (MYCOSTATIN/NYSTOP) powder [id number]  Apply 1 application topically 3 (three) times daily. Vivi Barrack, MD  Active Child  OLANZapine [facility]) 2.5 MG tablet [id number] Yes Take 1 tab at bedtime Arfeen, Arlyce Harman, MD  Active   pantoprazole (PROTONIX) 40 MG tablet [id number] Yes TAKE 1 TABLET BY MOUTH  TWICE DAILY BEFORE A MEAL  Patient taking differently: Take 40 mg by mouth 2 (two) times daily before a meal. TAKE 1 TABLET BY MOUTH  TWICE DAILY BEFORE A MEAL   Vivi Barrack, MD  Active   traMADol (ULTRAM) 50 MG tablet [id number]  TAKE 1 TABLET BY MOUTH  TWICE DAILY AS NEEDED  Patient not taking: Reported on 04/14/2021   Vivi Barrack, MD  Active             Patient Active Problem List   Diagnosis Date Noted   Seizure disorder ([place]) 03/29/2021   Acute on chronic diastolic CHF (congestive heart failure) ([place]) 03/26/2021   Obesity, Class III, BMI 40-49.9 (morbid obesity) ([place]) 03/26/2021   Dementia ([place]) 03/26/2021   Debility 11/06/2019   Venous stasis of lower extremity 10/14/2019   Hyperglycemia 05/06/2018   Constipation 04/30/2018   Osteoarthritis 01/31/2018   Lower extremity edema 07/31/2017   History of bilateral hip replacements    Memory disorder 12/20/2016   History  of lumbar laminectomy for spinal cord decompression 02/02/2016   Atopic dermatitis 07/09/2015   Reactive airway disease 08/11/2014   History of cancer 08/11/2014   OSA (obstructive sleep apnea) 08/11/2014   Chronic kidney disease (CKD), stage III (moderate) (HCC) 10/04/2012   Schizophrenia, chronic condition ([place]) 01/20/2012   Postmenopausal atrophic vaginitis 11/01/2010   Chronic diastolic heart failure (HCC) 05/06/2010   Allergic rhinitis 10/14/2009   Obesity, Class II, BMI 35-39.9, with comorbidity 05/14/2009   Chronic prescription opiate use 04/29/2008   Normocytic anemia 09/25/2007   Hyperlipidemia 09/03/2006   Gout 09/03/2006   Essential hypertension 09/03/2006   GERD 09/03/2006    Immunization History  Administered Date(s) Administered   Fluad Quad(high Dose 65+) 06/27/2019, 08/30/2020   Influenza Split 08/03/2011, 07/30/2012   Influenza Whole 08/09/2007, 07/27/2008, 07/26/2009, 07/20/2010   Influenza, High Dose Seasonal PF 07/17/2017, 08/01/2018   Influenza,inj,Quad PF,6+ Mos 07/17/2013, 07/08/2015, 08/03/2016   Influenza-Unspecified 07/18/2014   PFIZER(Purple Top)SARS-COV-2 Vaccination 01/01/2020, 01/27/2020   Pneumococcal Conjugate-13 02/11/2015   Pneumococcal Polysaccharide-23 08/26/2010, 11/02/2016   Td 05/07/2009   Zoster, Live 10/09/2012    Conditions to be addressed/monitored: HTN, CHF, OA, CKD III, OSA, Morbid Obesity, GERD, hyperglycemia, schizophrenia, seziure disorder, venous stasis of lower extremity  Care Plan : ccm pharmacy care plan  Updates made by Madelin Rear, [place] since 04/25/2021 12:00 AM     Problem: CHL AMB "PATIENT-SPECIFIC PROBLEM"      Long-Range Goal: Disease Management   Start Date: 04/25/2021  This Visit's Progress: On track  Priority: High  Note:    Hypertension(BP goal <130/80) -Controlled -CKD III-IV - follows with Harrie Jeans, HFpEF -Current treatment: Carvedilol 3.125 mg twice daily Amlodipine 5 mg once daily Losartan 100  mg daily  -Current home readings: testing some at home, 120s/60s today -Current dietary habits: some processed foods, no added salt -Current exercise habits: no routine exercise -Denies hypotensive/hypertensive symptoms -Educated on BP goals and benefits of medications for prevention of heart attack, stroke and kidney damage; -Counseled to monitor BP at home daily, document, and provide  log at future appointments -Counseled on diet and exercise extensively Recommended salt restricted diet <2g/day  Hyperlipidemia: (LDL goal < 70) -Not ideally controlled -Current treatment: Atorvastatin 40 mg once daily -No side effects noted -Educated on Cholesterol goals;  -Recommended to continue current medication  Gout (Goal: minimize symptoms) -Controlled, no recent episodes -Denies any issues with gout past two years -Current treatment  Allopurinol 300 mg once daily -Medications previously tried: n/a -Reviewed low purine diet/potential dietary triggers - some processed meat and soda mentioned -Recommended to continue current medication  Heart Failure (Goal: manage symptoms and prevent exacerbations) -Controlled -Last ejection fraction: 65-70% (Date: 08/2020) -Daughter has been keeping up keeping furosemide 80 mg twice daily -HF type: Diastolic -Unable to do daily weights. Difficulty ambulating. Rather they watch fluid retention in legs  -Current treatment: Furosemide 80 mg once daily (Dr Royce Macadamia) Metolazone 5 mg twice a week as needed (Dr Royce Macadamia) -Educated on Benefits of medications for managing symptoms and prolonging life Importance of weighing daily; speak with cardiology regarding alternative scales in patients not able to ambulate -Recommended to continue current medication  GERD (goal: minimize symptoms) -Not ideally controlled -some late night eating -Current regmimen Pantoprazole 40 mg twice daily  Reviewed triggers including soda, chocolate, late night eating. Discussed  famotidine as preferred over Tums for breakthrough reflux due to potential for constipation Recommended to continue current medication     Current Barriers:  Unable to maintain control of CHF, HLD  Pharmacist Clinical Goal(s):  Patient will contact provider office for questions/concerns as evidenced notation of same in electronic health record through collaboration with PharmD and provider.   Interventions: 1:1 collaboration with Vivi Barrack, MD regarding development and update of comprehensive plan of care as evidenced by provider attestation and co-signature Inter-disciplinary care team collaboration (see longitudinal plan of care) Comprehensive medication review performed; medication list updated in electronic medical record CHF counseling   Patient Goals/Self-Care Activities Patient will:  - take medications as prescribed  Medication Assistance: None required.  Patient affirms current coverage meets needs.  Patient's preferred pharmacy is:  [employer], television[employer]film/video  [facility]) - [place], [place] [address] Phone: [phone] Fax: [place] [address] [place]), [place] - [address] [place]) [place] [address] Phone: [phone] Fax: [phone]  Follow Up:  Patient agrees to Care Plan and Follow-up.  Plan:  CPA call 1 month, review BP readings, see if patient's daughter had any luck obtaining scale. [place] f/u telephone visit 2 months  Future Appointments  Date Time Provider [place]  04/26/2021 11:40 AM Vivi Barrack, MD [facility]  06/06/2021 10:00 AM Freada Bergeron, MD [facility]  06/29/2021  2:00 PM Cameron Sprang, MD [facility] None  07/13/2021  3:20 PM Arfeen, Arlyce Harman, MD [facility] None  09/01/2021  2:40 PM Vivi Barrack, MD [facility] [place], PharmD, [name] Clinical Pharmacist Practitioner  [facility]  [phone]

## 2021-04-25 NOTE — Patient Instructions (Addendum)
[name],  Thank you for talking with me today. I have included our care plan/goals in the following pages.   Please review and call me at [phone] with any questions.  Thanks! Ellin Mayhew, Pharm.D., BCGP Clinical Pharmacist [facility] [place] [phone] Patient Care Plan: ccm pharmacy care plan     Problem Identified: CHL AMB "PATIENT-SPECIFIC PROBLEM"      Long-Range Goal: Disease Management   Start Date: 04/25/2021  This Visit's Progress: On track  Priority: High  Note:    Hypertension(BP goal <130/80) -Controlled -CKD III-IV - follows with Harrie Jeans, HFpEF -Current treatment: Carvedilol 3.125 mg twice daily Amlodipine 5 mg once daily Losartan 100 mg daily  -Current home readings: testing some at home, 120s/60s today -Current dietary habits: some processed foods, no added salt -Current exercise habits: no routine exercise -Denies hypotensive/hypertensive symptoms -Educated on BP goals and benefits of medications for prevention of heart attack, stroke and kidney damage; -Counseled to monitor BP at home daily, document, and provide log at future appointments -Counseled on diet and exercise extensively Recommended salt restricted diet <2g/day  Hyperlipidemia: (LDL goal < 70) -Not ideally controlled -Current treatment: Atorvastatin 40 mg once daily -No side effects noted -Educated on Cholesterol goals;  -Recommended to continue current medication  Gout (Goal: minimize symptoms) -Controlled, no recent episodes -Denies any issues with gout past two years -Current treatment  Allopurinol 300 mg once daily -Medications previously tried: n/a -Reviewed low purine diet/potential dietary triggers - some processed meat and soda mentioned -Recommended to continue current medication  Heart Failure (Goal: manage symptoms and prevent exacerbations) -Controlled -Last ejection fraction: 65-70% (Date:  08/2020) -Daughter has been keeping up keeping furosemide 80 mg twice daily -HF type: Diastolic -Unable to do daily weights. Difficulty ambulating. Rather they watch fluid retention in legs  -Current treatment: Furosemide 80 mg once daily (Dr Royce Macadamia) Metolazone 5 mg twice a week as needed (Dr Royce Macadamia) -Educated on Benefits of medications for managing symptoms and prolonging life Importance of weighing daily; speak with cardiology regarding alternative scales in patients not able to ambulate -Recommended to continue current medication  GERD (goal: minimize symptoms) -Not ideally controlled -some late night eating -Current regmimen Pantoprazole 40 mg twice daily  Reviewed triggers including soda, chocolate, late night eating. Discussed famotidine as preferred over Tums for breakthrough reflux due to potential for constipation Recommended to continue current medication     The patient was given the following information about Chronic Care Management services today, agreed to services, and gave verbal consent: 1. CCM service includes personalized support from designated clinical staff supervised by the primary care provider, including individualized plan of care and coordination with other care providers 2. 24/7 contact phone numbers for assistance for urgent and routine care needs. 3. Service will only be billed when office clinical staff spend 20 minutes or more in a month to coordinate care. 4. Only one practitioner may furnish and bill the service in a calendar month. 5.The patient may stop CCM services at any time (effective at the end of the month) by phone call to the office staff. 6. The patient will be responsible for cost sharing (co-pay) of up to 20% of the service fee (after annual deductible is met). Patient agreed to services and consent obtained.  The patient verbalized understanding of instructions provided today and agreed to receive a MyChart copy of patient instruction and/or  educational materials. Telephone follow up appointment with pharmacy team  member scheduled for: See next appointment with "Care Management Staff" under "What's Next" below.

## 2021-04-26 ENCOUNTER — Other Ambulatory Visit: Payer: Self-pay

## 2021-04-26 ENCOUNTER — Encounter: Payer: Self-pay | Admitting: Family Medicine

## 2021-04-26 ENCOUNTER — Ambulatory Visit (INDEPENDENT_AMBULATORY_CARE_PROVIDER_SITE_OTHER): Payer: Medicare Other | Admitting: Family Medicine

## 2021-04-26 VITALS — BP 122/82 | HR 69 | Temp 97.9°F | Ht 63.0 in

## 2021-04-26 DIAGNOSIS — Z7951 Long term (current) use of inhaled steroids: Secondary | ICD-10-CM

## 2021-04-26 DIAGNOSIS — H903 Sensorineural hearing loss, bilateral: Secondary | ICD-10-CM

## 2021-04-26 DIAGNOSIS — G40909 Epilepsy, unspecified, not intractable, without status epilepticus: Secondary | ICD-10-CM

## 2021-04-26 DIAGNOSIS — I872 Venous insufficiency (chronic) (peripheral): Secondary | ICD-10-CM | POA: Diagnosis not present

## 2021-04-26 DIAGNOSIS — I2510 Atherosclerotic heart disease of native coronary artery without angina pectoris: Secondary | ICD-10-CM | POA: Diagnosis not present

## 2021-04-26 DIAGNOSIS — L89309 Pressure ulcer of unspecified buttock, unspecified stage: Secondary | ICD-10-CM | POA: Diagnosis not present

## 2021-04-26 DIAGNOSIS — R300 Dysuria: Secondary | ICD-10-CM

## 2021-04-26 DIAGNOSIS — M109 Gout, unspecified: Secondary | ICD-10-CM

## 2021-04-26 DIAGNOSIS — K219 Gastro-esophageal reflux disease without esophagitis: Secondary | ICD-10-CM

## 2021-04-26 DIAGNOSIS — M159 Polyosteoarthritis, unspecified: Secondary | ICD-10-CM

## 2021-04-26 DIAGNOSIS — Z87891 Personal history of nicotine dependence: Secondary | ICD-10-CM

## 2021-04-26 DIAGNOSIS — I130 Hypertensive heart and chronic kidney disease with heart failure and stage 1 through stage 4 chronic kidney disease, or unspecified chronic kidney disease: Secondary | ICD-10-CM | POA: Diagnosis not present

## 2021-04-26 DIAGNOSIS — F209 Schizophrenia, unspecified: Secondary | ICD-10-CM

## 2021-04-26 DIAGNOSIS — I10 Essential (primary) hypertension: Secondary | ICD-10-CM

## 2021-04-26 DIAGNOSIS — E785 Hyperlipidemia, unspecified: Secondary | ICD-10-CM | POA: Diagnosis not present

## 2021-04-26 DIAGNOSIS — F0391 Unspecified dementia with behavioral disturbance: Secondary | ICD-10-CM

## 2021-04-26 DIAGNOSIS — F1120 Opioid dependence, uncomplicated: Secondary | ICD-10-CM

## 2021-04-26 DIAGNOSIS — Z8619 Personal history of other infectious and parasitic diseases: Secondary | ICD-10-CM

## 2021-04-26 DIAGNOSIS — J449 Chronic obstructive pulmonary disease, unspecified: Secondary | ICD-10-CM | POA: Diagnosis not present

## 2021-04-26 DIAGNOSIS — I5033 Acute on chronic diastolic (congestive) heart failure: Secondary | ICD-10-CM | POA: Diagnosis not present

## 2021-04-26 DIAGNOSIS — I700 Atherosclerosis of aorta: Secondary | ICD-10-CM | POA: Diagnosis not present

## 2021-04-26 DIAGNOSIS — Z8541 Personal history of malignant neoplasm of cervix uteri: Secondary | ICD-10-CM

## 2021-04-26 DIAGNOSIS — F32A Depression, unspecified: Secondary | ICD-10-CM | POA: Diagnosis not present

## 2021-04-26 DIAGNOSIS — F0390 Unspecified dementia without behavioral disturbance: Secondary | ICD-10-CM

## 2021-04-26 DIAGNOSIS — N183 Chronic kidney disease, stage 3 unspecified: Secondary | ICD-10-CM | POA: Diagnosis not present

## 2021-04-26 MED ORDER — AZELASTINE HCL 0.1 % NA SOLN: 2.0000 | Freq: Two times a day (BID) | NASAL | 12 refills | Status: DC

## 2021-04-26 MED ORDER — DONEPEZIL HCL 5 MG PO TABS: 5.0000 mg | ORAL_TABLET | Freq: Every day | ORAL | 3 refills | Status: DC

## 2021-04-26 MED ORDER — AMOXICILLIN-POT CLAVULANATE 875-125 MG PO TABS: 1.0000 | ORAL_TABLET | Freq: Two times a day (BID) | ORAL | 0 refills | Status: DC

## 2021-04-26 MED ORDER — FLORANEX PO PACK: 1.0000 g | PACK | Freq: Three times a day (TID) | ORAL | 5 refills | Status: DC

## 2021-04-26 MED ORDER — DONEPEZIL HCL 5 MG PO TABS
5.0000 mg | ORAL_TABLET | Freq: Every day | ORAL | 3 refills | Status: DC
Start: 2021-04-26 — End: 2021-04-26

## 2021-04-26 NOTE — Telephone Encounter (Signed)
Sent Rx to [facility] on [address] and to [facility] for 90-day supply

## 2021-04-26 NOTE — Assessment & Plan Note (Signed)
Also following with neurology.

## 2021-04-26 NOTE — Assessment & Plan Note (Signed)
At goal on Coreg 3.125 mg twice daily, amlodipine 5 mg daily, and losartan 100 mg daily.

## 2021-04-26 NOTE — Patient Instructions (Signed)
It was very nice to see you today!  We will check a urine culture today.  You can go ahead and start Augmentin.  This should treat any sort of skin infections as well as most UTIs.  We may need to switch antibiotic depending on the culture of her urine.  Please try the nasal spray.  This should help with her congestion.  I will also see if we can get a home health nurse to come help.  Take care, Dr [name]  PLEASE NOTE:  If you had any lab tests please let us know if you have not heard back within a few days. You may see your results on mychart before we have a chance to review them but we will give you a call once they are reviewed by [place]. If we ordered any referrals today, please let us know if you have not heard from their office within the next week.   Please try these tips to maintain a healthy lifestyle:  Eat at least 3 REAL meals and 1-2 snacks per day.  Aim for no more than 5 hours between eating.  If you eat breakfast, please do so within one hour of getting up.   Each meal should contain half fruits/vegetables, one quarter protein, and one quarter carbs (no bigger than a computer mouse)  Cut down on sweet beverages. This includes juice, soda, and sweet tea.   Drink at least 1 glass of water with each meal and aim for at least 8 glasses per day  Exercise at least 150 minutes every week.

## 2021-04-26 NOTE — Telephone Encounter (Signed)
Pt daughter called in again regarding her aricept refill [phone]

## 2021-04-26 NOTE — Assessment & Plan Note (Signed)
Follows with neurology.  She is nonelectrical.  No recurrent seizures.

## 2021-04-26 NOTE — Addendum Note (Signed)
Addended by: Cameron Sprang on: 04/26/2021 04:46 PM   Modules accepted: Orders

## 2021-04-26 NOTE — Progress Notes (Signed)
Chief Complaint:  [name] is a 80 y.o. female who presents today for a TCM visit.  Assessment/Plan:  New/Acute Problems: Dysuria Check urine culture  Sore throat/URI Patient did not cooperate with strep test.  Will send in prescription for Augmentin.  This should treat concern for possible cellulitis as well and potentially her UTI.    Pressure injury Patient with pain and erythema to posterior buttocks.  This consistent with pressure injury though cellulitis cannot be excluded.  We will start Augmentin.  Given concern for pressure injury we will see if we can have home health nurse come to evaluate.  Discussed importance of regularly rotating sitting position.  Chronic Problems Addressed Today: Seizure disorder [facility]) Follows with neurology.  She is nonelectrical.  No recurrent seizures.  Essential hypertension At goal on Coreg 3.125 mg twice daily, amlodipine 5 mg daily, and losartan 100 mg daily.  Dementia [facility]) Also following with neurology.      Subjective:  HPI:  Summary of Hospital admission: Reason for admission: Seizure Date of admission: 03/26/2021 Date of discharge: 03/29/2021 Date of Interactive contact: 03/31/2021 Summary of Hospital course: Patient presented to the ED on 03/18/2021 with her family.  Had an episode of generalized shaking, unresponsiveness that lasted for about 15 seconds.  She underwent stroke evaluation in the ED.  Neurology was consulted.  MRI was negative for stroke or masses.  EEG showed possible epileptogenicity.  She was started on Depakote and discharged home in stable condition.  Interim history:  History provided by patient's daughter.  In terms of her seizure disorder symptoms have been relatively stable.  She followed up with neurology on 04/01/2021.  She was having adverse reactions to Depakote with headache.  She was switched to Lamictal.  Seems to be doing well with this.  Her daughter is concerned about possible infection on  her buttocks.  She is concerned about a possible boil.  She does have a history of abscesses in the past.  Has some pain to the area.  No obvious drainage.  More redness.  Also concern for possible UTI.  Patient not able to give much history though does complain of itching/pain with urination.  They would like to have a urine culture checked today.  She is also been having some upper respiratory symptoms for the past several days.  Patient is getting occasionally complaining of sore throat.  Also some ringing in her ears.  No specific treatments tried for this.  No fevers or chills though feels persistently cold.  PMH:  The following were reviewed and entered/updated in epic: Past Medical History:  Diagnosis Date   CHF (congestive heart failure) (HCC)    Chronic venous insufficiency    Colon, diverticulosis 06/2010   outpatient colonoscopy by Dr. Cristina Gong.  Need record   DDD (degenerative disc disease), lumbosacral    Depression    DJD (degenerative joint disease), multiple sites    Low back pain worst   GERD (gastroesophageal reflux disease)    Gout    Gout    Uric Acid level 4.2 on 300 allopurinol   History of cervical cancer 1972   Hx-TIA (transient ischemic attack) 05/13/2001   Right facial numbness   Hx-TIA (transient ischemic attack) 05/13/2001   Looking back in Epic, the patient had a hospitalization in July 2002 for evaluation of questionable TIA (s/s of right facial numbness associated with mild blurred vision in her right eye, diaphoresis, and mild confusion) with negative CT head and negative  MRI/MRA and was started on Plavix after being heparinized. She was to have possible follow up with Neurology for reevaluation of the need for Pl   Hyperlipidemia    Hypertension    Keloid 08/01/2018   Memory disorder 12/20/2016   Mitral valve prolapse 10/27/1999   Subsequent 2D echo show normal mitral valve   Osteoarthritis of hip    bilateral hips   Ovarian cancer ([place]) 1972   S/P  oophorectomy   Psychosis ([place])    Recurrent boils    History of MRSA skin infections with abscess   Seizures ([place])    Sensorineural hearing loss of both ears    Subdural hematoma ([place]) 01/26/2017   bilateral   Uncomplicated opioid dependence ([place]) 01/31/2018   Patient Active Problem List   Diagnosis Date Noted   Seizure disorder ([place]) 03/29/2021   Acute on chronic diastolic CHF (congestive heart failure) ([place]) 03/26/2021   Obesity, Class III, BMI 40-49.9 (morbid obesity) ([employer]) 03/26/2021   Dementia ([place]) 03/26/2021   Debility 11/06/2019   Venous stasis of lower extremity 10/14/2019   Hyperglycemia 05/06/2018   Constipation 04/30/2018   Osteoarthritis 01/31/2018   Lower extremity edema 07/31/2017   History of bilateral hip replacements    Memory disorder 12/20/2016   History of lumbar laminectomy for spinal cord decompression 02/02/2016   Atopic dermatitis 07/09/2015   Reactive airway disease 08/11/2014   History of cancer 08/11/2014   OSA (obstructive sleep apnea) 08/11/2014   Chronic kidney disease (CKD), stage III (moderate) ([place]) 10/04/2012   Schizophrenia, chronic condition ([place]) 01/20/2012   Postmenopausal atrophic vaginitis 11/01/2010   Chronic diastolic heart failure ([place]) 05/06/2010   Allergic rhinitis 10/14/2009   Obesity, Class II, BMI 35-39.9, with comorbidity 05/14/2009   Chronic prescription opiate use 04/29/2008   Normocytic anemia 09/25/2007   Hyperlipidemia 09/03/2006   Gout 09/03/2006   Essential hypertension 09/03/2006   GERD 09/03/2006   Past Surgical History:  Procedure Laterality Date   ABDOMINAL HYSTERECTOMY     1972   BURR HOLE Bilateral 02/12/2017   Procedure: Haskell Flirt;  Surgeon: Earnie Larsson, MD;  Location: [facility];  Service: Neurosurgery;  Laterality: Bilateral;   CERVICAL DISCECTOMY  7/07   C5-C6   JOINT REPLACEMENT     bilateral hip replacement   LUMBAR DISC SURGERY     L5-S1 7/07   LUMBAR LAMINECTOMY/DECOMPRESSION MICRODISCECTOMY N/A  02/02/2016   Procedure: L4-5 Decompression;  Surgeon: Marybelle Killings, MD;  Location: [facility];  Service: Orthopedics;  Laterality: N/A;   OOPHORECTOMY     1972 for ovarian cancer    Family History  Problem Relation Age of Onset   Diabetes Mother    Heart disease Mother    Hearing loss Mother    Stroke Mother    Post-traumatic stress disorder Father    Schizophrenia Maternal Grandmother    Diabetes Maternal Grandfather    Other Brother        brother died in mental health hospital   Kidney cancer Neg Hx    Bladder Cancer Neg Hx     Medications- Reconciled discharge and current medications in Epic.  Current Outpatient Medications  Medication Sig Dispense Refill   acetaminophen (TYLENOL) 500 MG tablet Take 500 mg by mouth at bedtime.      albuterol (VENTOLIN HFA) 108 (90 Base) MCG/ACT inhaler Inhale 2 puffs into the lungs every 6 (six) hours as needed for wheezing or shortness of breath. 8 g 0   allopurinol (ZYLOPRIM) 300 MG tablet TAKE  1 TABLET BY MOUTH  DAILY (Patient taking differently: Take 300 mg by mouth daily.) 90 tablet 3   amoxicillin-clavulanate (AUGMENTIN) 875-125 MG tablet Take 1 tablet by mouth 2 (two) times daily. 14 tablet 0   ascorbic acid (VITAMIN C) 500 MG tablet Take 500 mg by mouth daily.      atorvastatin (LIPITOR) 40 MG tablet TAKE 1 TABLET BY MOUTH  DAILY (Patient taking differently: Take 40 mg by mouth daily.) 90 tablet 3   azelastine (ASTELIN) 0.1 % nasal spray Place 2 sprays into both nostrils 2 (two) times daily. 30 mL 12   budesonide (PULMICORT) 0.25 MG/2ML nebulizer solution Take 2 mLs (0.25 mg total) by nebulization 2 (two) times daily. 120 mL 2   buPROPion (WELLBUTRIN XL) 300 MG 24 hr tablet Take 1 tablet (300 mg total) by mouth every morning. 90 tablet 0   carvedilol (COREG) 3.125 MG tablet Take 1 tablet (3.125 mg total) by mouth 2 (two) times daily with a meal. 180 tablet 3   Cyanocobalamin (VITAMIN B12) 1000 MCG TBCR Take 1 tablet by mouth daily.      diclofenac Sodium (VOLTAREN) 1 % GEL Apply 4 g topically 4 (four) times daily as needed (for pain).     donepezil (ARICEPT) 5 MG tablet Take 1 tablet (5 mg total) by mouth at bedtime. 30 tablet 0   fluticasone (FLONASE) 50 MCG/ACT nasal spray Place into both nostrils as needed for allergies or rhinitis.     furosemide (LASIX) 80 MG tablet Take 80 mg by mouth daily.     ipratropium-albuterol (DUONEB) 0.5-2.5 (3) MG/3ML SOLN Take 3 mLs by nebulization 2 (two) times daily. 360 mL 0   ketoconazole (NIZORAL) 2 % cream Apply 1 application topically 2 (two) times daily. 60 g 0   lactobacillus (FLORANEX/LACTINEX) PACK Take 1 packet (1 g total) by mouth 3 (three) times daily with meals. 12 each 5   lamoTRIgine (LAMICTAL) 25 MG tablet Take tablet twice a day for 2 weeks, then increase to 2 tablets twice a day 120 tablet 6   losartan (COZAAR) 100 MG tablet Take 1 tablet (100 mg total) by mouth daily. 90 tablet 3   metolazone (ZAROXOLYN) 5 MG tablet Take 5 mg by mouth. Twice weekly as needed     nystatin (MYCOSTATIN/NYSTOP) powder Apply 1 application topically 3 (three) times daily. 60 g 0   OLANZapine (ZYPREXA) 2.5 MG tablet Take 1 tab at bedtime 90 tablet 0   pantoprazole (PROTONIX) 40 MG tablet TAKE 1 TABLET BY MOUTH  TWICE DAILY BEFORE A MEAL (Patient taking differently: Take 40 mg by mouth 2 (two) times daily before a meal. TAKE 1 TABLET BY MOUTH  TWICE DAILY BEFORE A MEAL) 180 tablet 3   traMADol (ULTRAM) 50 MG tablet TAKE 1 TABLET BY MOUTH  TWICE DAILY AS NEEDED (Patient taking differently: TAKE 1 TABLET BY MOUTH  TWICE DAILY AS NEEDED) 60 tablet 5   amLODipine (NORVASC) 5 MG tablet Take 1 tablet (5 mg total) by mouth daily. 90 tablet 3   No current facility-administered medications for this visit.    Allergies-reviewed and updated Allergies  Allergen Reactions   Aspirin Other (See Comments)    Bleeding from vagina    Sulfamethoxazole-Trimethoprim Itching    Social History   Socioeconomic  History   Marital status: Widowed    Spouse name: Not on file   Number of children: 3   Years of education: 15   Highest education level: Not on file  Occupational History   Occupation: Retired  Tobacco Use   Smoking status: Former    Packs/day: 0.10    Years: 35.00    Pack years: 3.50    Types: Cigarettes    Quit date: 04/08/2013    Years since quitting: 8.0   Smokeless tobacco: Never   Tobacco comments:    smoked a few when her son died in 02-01-13   Vaping Use   Vaping Use: Never used  Substance and Sexual Activity   Alcohol use: No    Alcohol/week: 0.0 standard drinks   Drug use: No   Sexual activity: Never  Other Topics Concern   Not on file  Social History Narrative   Lives with daughter in [place]. Daughter is primary caretaker, surrogate decision-maker, and arranges for [facility] aide and other caregivers to supervise Ms. Forsberg at home. Ambulatory   Social Determinants of Health   Financial Resource Strain: Not on file  Food Insecurity: Not on file  Transportation Needs: No Transportation Needs   Lack of Transportation (Medical): No   Lack of Transportation (Non-Medical): No  Physical Activity: Not on file  Stress: Not on file  Social Connections: Not on file        Objective:  Physical Exam: BP 122/82   Pulse 69   Temp 97.9 F (36.6 C) (Temporal)   Ht 5\' 3"  (1.6 m)   SpO2 97%   BMI 41.81 kg/m   Gen: NAD, resting comfortably HEENT: Bilateral TMs with clear effusion.  OP erythematous.  No exudates. CV: RRR with no murmurs appreciated Pulm: NWOB, CTAB with no crackles, wheezes, or rhonchi GI: Normal bowel sounds present. Soft, Nontender, Nondistended. MSK: No edema, cyanosis, or clubbing noted Skin: Warm, dry.  Erythema noted at left gluteal cleft.  Patient's daughter assisted with this exam.   Time Spent: 45 minutes of total time was spent on the date of the encounter performing the following actions: chart review prior to seeing the patient including  her recent hospitalization and specialist visits, obtaining history, performing a medically necessary exam, counseling on the treatment plan, placing orders, and documenting in our EHR.         Algis Greenhouse. [name], MD 04/26/2021 12:44 PM

## 2021-04-27 LAB — URINE CULTURE
MICRO NUMBER:: 12059251
SPECIMEN QUALITY:: ADEQUATE

## 2021-04-29 ENCOUNTER — Other Ambulatory Visit: Payer: Self-pay

## 2021-04-29 ENCOUNTER — Telehealth: Payer: Self-pay

## 2021-04-29 DIAGNOSIS — I2510 Atherosclerotic heart disease of native coronary artery without angina pectoris: Secondary | ICD-10-CM | POA: Diagnosis not present

## 2021-04-29 DIAGNOSIS — E785 Hyperlipidemia, unspecified: Secondary | ICD-10-CM | POA: Diagnosis not present

## 2021-04-29 DIAGNOSIS — G459 Transient cerebral ischemic attack, unspecified: Secondary | ICD-10-CM | POA: Diagnosis not present

## 2021-04-29 DIAGNOSIS — N183 Chronic kidney disease, stage 3 unspecified: Secondary | ICD-10-CM | POA: Diagnosis not present

## 2021-04-29 DIAGNOSIS — J449 Chronic obstructive pulmonary disease, unspecified: Secondary | ICD-10-CM | POA: Diagnosis not present

## 2021-04-29 DIAGNOSIS — I130 Hypertensive heart and chronic kidney disease with heart failure and stage 1 through stage 4 chronic kidney disease, or unspecified chronic kidney disease: Secondary | ICD-10-CM | POA: Diagnosis not present

## 2021-04-29 DIAGNOSIS — I5033 Acute on chronic diastolic (congestive) heart failure: Secondary | ICD-10-CM | POA: Diagnosis not present

## 2021-04-29 DIAGNOSIS — I700 Atherosclerosis of aorta: Secondary | ICD-10-CM | POA: Diagnosis not present

## 2021-04-29 DIAGNOSIS — F32A Depression, unspecified: Secondary | ICD-10-CM | POA: Diagnosis not present

## 2021-04-29 DIAGNOSIS — I872 Venous insufficiency (chronic) (peripheral): Secondary | ICD-10-CM | POA: Diagnosis not present

## 2021-04-29 MED ORDER — FLUCONAZOLE 150 MG PO TABS: 150.0000 mg | ORAL_TABLET | Freq: Once | ORAL | 0 refills | Status: AC

## 2021-04-29 NOTE — Progress Notes (Signed)
Please inform patient of the following:  Urine culture negative. Would lie for them to let us know if her symptoms are not improving.  Algis Greenhouse. [name], MD 04/29/2021 5:44 PM

## 2021-04-29 NOTE — Telephone Encounter (Signed)
Spoke with patient daughter, Rx sent to Pharmacy Diflucan per [place] PA

## 2021-04-29 NOTE — Telephone Encounter (Signed)
Patient's daughter called in requesting lab results from urine culture.   Culture states that urine was not collected.  Patient state she had handed this to [place].    Has[place], can you please follow up with daughter in regard to getting this culture completed?  Also, daughter is requesting a yeast infection medication for patient since Dr. [name] had prescribed an antibiotic.  Daughter does not want patient to go through the weekend with out a yeast infection med.    Dr. Yong Channel could you send a script in for patient to [facility] on [address]?

## 2021-05-03 ENCOUNTER — Telehealth: Payer: Self-pay

## 2021-05-03 NOTE — Telephone Encounter (Signed)
Pt's daughter called regarding a missed called from this office. She also stated that she has sent emails to Dr [name].

## 2021-05-03 NOTE — Telephone Encounter (Signed)
Left message on voicemail to call office. See My Chart message.

## 2021-05-03 NOTE — Telephone Encounter (Signed)
Left message on voicemail to call office.  

## 2021-05-04 ENCOUNTER — Telehealth: Payer: Self-pay

## 2021-05-04 NOTE — Telephone Encounter (Signed)
Dr. [name], please see message and advise.

## 2021-05-04 NOTE — Telephone Encounter (Signed)
Spoke to pt's daughter [name], told her Urine culture was Neg per Dr. [name]. [name] verbalized understanding and said she is better except for boil on buttock started bothering her yesterday. [name] said it is white, has a head and looks like it is going to pop. She said she put Draw out ointment on it yesterday  a few times. She said pt finished antibiotic Augmentin yesterday.  Asked her if pt having any fever or chills. [name] said no. Told her okay I will send message to Dr. [name] and she what he wants to do and get back to you. [name] verbalized understanding.

## 2021-05-04 NOTE — Telephone Encounter (Signed)
We discussed this at her office visit. I believe this is probably a pressure injury. Recommend having a wound care specialist take a look if they wish.

## 2021-05-04 NOTE — Telephone Encounter (Signed)
Pt's daughter called back to speak to Butch Penny. Please call her back [phone]

## 2021-05-05 ENCOUNTER — Other Ambulatory Visit: Payer: Self-pay | Admitting: *Deleted

## 2021-05-05 MED ORDER — FLUCONAZOLE 150 MG PO TABS: ORAL_TABLET | ORAL | 0 refills | Status: DC

## 2021-05-05 MED ORDER — DOXYCYCLINE MONOHYDRATE 100 MG PO CAPS: 100.0000 mg | ORAL_CAPSULE | Freq: Two times a day (BID) | ORAL | 0 refills | Status: AC

## 2021-05-05 NOTE — Telephone Encounter (Signed)
Patient notified Rx send in

## 2021-05-06 ENCOUNTER — Telehealth: Payer: Self-pay

## 2021-05-06 DIAGNOSIS — F32A Depression, unspecified: Secondary | ICD-10-CM | POA: Diagnosis not present

## 2021-05-06 DIAGNOSIS — I700 Atherosclerosis of aorta: Secondary | ICD-10-CM | POA: Diagnosis not present

## 2021-05-06 DIAGNOSIS — N183 Chronic kidney disease, stage 3 unspecified: Secondary | ICD-10-CM | POA: Diagnosis not present

## 2021-05-06 DIAGNOSIS — I5033 Acute on chronic diastolic (congestive) heart failure: Secondary | ICD-10-CM | POA: Diagnosis not present

## 2021-05-06 DIAGNOSIS — J449 Chronic obstructive pulmonary disease, unspecified: Secondary | ICD-10-CM | POA: Diagnosis not present

## 2021-05-06 DIAGNOSIS — I130 Hypertensive heart and chronic kidney disease with heart failure and stage 1 through stage 4 chronic kidney disease, or unspecified chronic kidney disease: Secondary | ICD-10-CM | POA: Diagnosis not present

## 2021-05-06 DIAGNOSIS — E785 Hyperlipidemia, unspecified: Secondary | ICD-10-CM | POA: Diagnosis not present

## 2021-05-06 DIAGNOSIS — I872 Venous insufficiency (chronic) (peripheral): Secondary | ICD-10-CM | POA: Diagnosis not present

## 2021-05-06 DIAGNOSIS — I2510 Atherosclerotic heart disease of native coronary artery without angina pectoris: Secondary | ICD-10-CM | POA: Diagnosis not present

## 2021-05-06 DIAGNOSIS — G459 Transient cerebral ischemic attack, unspecified: Secondary | ICD-10-CM | POA: Diagnosis not present

## 2021-05-06 NOTE — Chronic Care Management (AMB) (Signed)
Chronic Care Management Pharmacy Assistant   Name: [name]  MRN: [id number] DOB: Dec 26, 1940  Reason for Encounter: General Adherence  Recent office visits:  05/05/21 Orders only - started short course of doxycyline 100 mg capsule 1 capsule by mouth twice daily for 7 days and fluconazole 150 mg 1 tablet every 3 days for 2 doses 04/29/21 - Orders only - Fluconazole 150 mg for one dose. 04/26/21 Shelby Parker,MD (PCP) Seizure disorder -  Started Augmentin 875-125mg  1 tablet twice daily. Started Azelastine HCL 0.1% 2 sprays each nare twice daily also start Lactobacillus 1g three times a day. No follow up noted.   Recent consult visits:  No visits noted.   Hospital visits:  None in previous 6 months  Medications: Outpatient Encounter Medications as of 05/06/2021  Medication Sig   doxycycline (MONODOX) 100 MG capsule Take 1 capsule (100 mg total) by mouth 2 (two) times daily for 7 days.   fluconazole (DIFLUCAN) 150 MG tablet Take 1 tablet every 3 days for 2 doses.   acetaminophen (TYLENOL) 500 MG tablet Take 500 mg by mouth at bedtime.    albuterol (VENTOLIN HFA) 108 (90 Base) MCG/ACT inhaler Inhale 2 puffs into the lungs every 6 (six) hours as needed for wheezing or shortness of breath.   allopurinol (ZYLOPRIM) 300 MG tablet TAKE 1 TABLET BY MOUTH  DAILY (Patient taking differently: Take 300 mg by mouth daily.)   amLODipine (NORVASC) 5 MG tablet Take 1 tablet (5 mg total) by mouth daily.   amoxicillin-clavulanate (AUGMENTIN) 875-125 MG tablet Take 1 tablet by mouth 2 (two) times daily.   ascorbic acid (VITAMIN C) 500 MG tablet Take 500 mg by mouth daily.    atorvastatin (LIPITOR) 40 MG tablet TAKE 1 TABLET BY MOUTH  DAILY (Patient taking differently: Take 40 mg by mouth daily.)   azelastine (ASTELIN) 0.1 % nasal spray Place 2 sprays into both nostrils 2 (two) times daily.   budesonide (PULMICORT) 0.25 MG/2ML nebulizer solution Take 2 mLs (0.25 mg total) by nebulization 2 (two) times  daily.   buPROPion (WELLBUTRIN XL) 300 MG 24 hr tablet Take 1 tablet (300 mg total) by mouth every morning.   carvedilol (COREG) 3.125 MG tablet Take 1 tablet (3.125 mg total) by mouth 2 (two) times daily with a meal.   Cyanocobalamin (VITAMIN B12) 1000 MCG TBCR Take 1 tablet by mouth daily.   diclofenac Sodium (VOLTAREN) 1 % GEL Apply 4 g topically 4 (four) times daily as needed (for pain).   donepezil (ARICEPT) 5 MG tablet Take 1 tablet (5 mg total) by mouth at bedtime.   fluticasone (FLONASE) 50 MCG/ACT nasal spray Place into both nostrils as needed for allergies or rhinitis.   furosemide (LASIX) 80 MG tablet Take 80 mg by mouth daily.   ipratropium-albuterol (DUONEB) 0.5-2.5 (3) MG/3ML SOLN Take 3 mLs by nebulization 2 (two) times daily.   ketoconazole (NIZORAL) 2 % cream Apply 1 application topically 2 (two) times daily.   lactobacillus (FLORANEX/LACTINEX) PACK Take 1 packet (1 g total) by mouth 3 (three) times daily with meals.   lamoTRIgine (LAMICTAL) 25 MG tablet Take tablet twice a day for 2 weeks, then increase to 2 tablets twice a day   losartan (COZAAR) 100 MG tablet Take 1 tablet (100 mg total) by mouth daily.   metolazone (ZAROXOLYN) 5 MG tablet Take 5 mg by mouth. Twice weekly as needed   nystatin (MYCOSTATIN/NYSTOP) powder Apply 1 application topically 3 (three) times daily.   OLANZapine (  ZYPREXA) 2.5 MG tablet Take 1 tab at bedtime   pantoprazole (PROTONIX) 40 MG tablet TAKE 1 TABLET BY MOUTH  TWICE DAILY BEFORE A MEAL (Patient taking differently: Take 40 mg by mouth 2 (two) times daily before a meal. TAKE 1 TABLET BY MOUTH  TWICE DAILY BEFORE A MEAL)   traMADol (ULTRAM) 50 MG tablet TAKE 1 TABLET BY MOUTH  TWICE DAILY AS NEEDED (Patient taking differently: TAKE 1 TABLET BY MOUTH  TWICE DAILY AS NEEDED)   No facility-administered encounter medications on file as of 05/06/2021.   Reviewed chart prior to disease state call. Spoke with patient regarding BP  Recent Office  Vitals: BP Readings from Last 3 Encounters:  04/26/21 122/82  04/01/21 (!) 151/68  03/29/21 111/65   Pulse Readings from Last 3 Encounters:  04/26/21 69  04/01/21 76  03/29/21 69    Wt Readings from Last 3 Encounters:  04/01/21 236 lb (107 kg)  03/28/21 270 lb 11.6 oz (122.8 kg)  03/25/21 265 lb 6.4 oz (120.4 kg)     Kidney Function Lab Results  Component Value Date/Time   CREATININE 1.64 (H) 03/29/2021 06:32 AM   CREATININE 1.42 (H) 03/28/2021 07:08 AM   CREATININE 1.43 (H) 09/22/2020 03:28 PM   CREATININE 1.39 (H) 09/13/2020 03:39 PM   GFR 30.94 (L) 02/28/2021 03:03 PM   GFRNONAA 32 (L) 03/29/2021 06:32 AM   GFRNONAA 40 (L) 05/19/2014 11:36 AM   GFRAA 29 01/25/2021 12:00 AM   GFRAA 46 (L) 05/19/2014 11:36 AM    BMP Latest Ref Rng & Units 03/29/2021 03/28/2021 03/27/2021  Glucose 70 - 99 mg/dL 121(H) 130(H) 117(H)  BUN 8 - 23 mg/dL 21 21 22   Creatinine 0.44 - 1.00 mg/dL 1.64(H) 1.42(H) 1.41(H)  BUN/Creat Ratio 12 - 28 - - -  Sodium 135 - 145 mmol/L 142 143 142  Potassium 3.5 - 5.1 mmol/L 3.7 3.8 3.3(L)  Chloride 98 - 111 mmol/L 105 107 106  CO2 22 - 32 mmol/L 27 26 26   Calcium 8.9 - 10.3 mg/dL 9.3 9.3 9.0   All responses are from patient's daughter. [name] developed a cysts on her buttocks which was treated by antibiotics a few weeks ago. Her daughter stated that even though it has been treated, the cysts has not healed and has had no changes. Even with it still being present, she stated that her mother has not been complaining of pain. Overall there are no immediate concerns for her mother's health.   Current antihypertensive regimen:  Carvedilol 3.125 mg twice daily Amlodipine 5 mg once daily Losartan 100 mg daily  Current home BP readings: 126/85 Patient's daughter stated that BP is taken at home occasionally, but with regular visits from home health nurse and PT visits, they usually rely on readings from them  What diet changes have been made to improve  Blood Pressure Control?  Patient's daughter stated that they substitute salt with pepper, basil, and rosemary  What exercise is being done to improve your Blood Pressure Control?  Patient's daughter stated that [name] has been working with PT to improve her ability to be active. She has been doing weight with her therapist.  Star Rating Drugs:  atorvastatin 40 mg  - last filled 04/01/21 - 90 days Losartan 100 mg  - last filled 04/01/21 - 90 days  [employer], CMA

## 2021-05-09 DIAGNOSIS — F32A Depression, unspecified: Secondary | ICD-10-CM | POA: Diagnosis not present

## 2021-05-09 DIAGNOSIS — N183 Chronic kidney disease, stage 3 unspecified: Secondary | ICD-10-CM | POA: Diagnosis not present

## 2021-05-09 DIAGNOSIS — E785 Hyperlipidemia, unspecified: Secondary | ICD-10-CM | POA: Diagnosis not present

## 2021-05-09 DIAGNOSIS — J449 Chronic obstructive pulmonary disease, unspecified: Secondary | ICD-10-CM | POA: Diagnosis not present

## 2021-05-09 DIAGNOSIS — I872 Venous insufficiency (chronic) (peripheral): Secondary | ICD-10-CM | POA: Diagnosis not present

## 2021-05-09 DIAGNOSIS — I130 Hypertensive heart and chronic kidney disease with heart failure and stage 1 through stage 4 chronic kidney disease, or unspecified chronic kidney disease: Secondary | ICD-10-CM | POA: Diagnosis not present

## 2021-05-09 DIAGNOSIS — I5033 Acute on chronic diastolic (congestive) heart failure: Secondary | ICD-10-CM | POA: Diagnosis not present

## 2021-05-09 DIAGNOSIS — I700 Atherosclerosis of aorta: Secondary | ICD-10-CM | POA: Diagnosis not present

## 2021-05-09 DIAGNOSIS — G459 Transient cerebral ischemic attack, unspecified: Secondary | ICD-10-CM | POA: Diagnosis not present

## 2021-05-09 DIAGNOSIS — I2510 Atherosclerotic heart disease of native coronary artery without angina pectoris: Secondary | ICD-10-CM | POA: Diagnosis not present

## 2021-05-11 DIAGNOSIS — I700 Atherosclerosis of aorta: Secondary | ICD-10-CM | POA: Diagnosis not present

## 2021-05-11 DIAGNOSIS — G459 Transient cerebral ischemic attack, unspecified: Secondary | ICD-10-CM | POA: Diagnosis not present

## 2021-05-11 DIAGNOSIS — F32A Depression, unspecified: Secondary | ICD-10-CM | POA: Diagnosis not present

## 2021-05-11 DIAGNOSIS — I2510 Atherosclerotic heart disease of native coronary artery without angina pectoris: Secondary | ICD-10-CM | POA: Diagnosis not present

## 2021-05-11 DIAGNOSIS — N183 Chronic kidney disease, stage 3 unspecified: Secondary | ICD-10-CM | POA: Diagnosis not present

## 2021-05-11 DIAGNOSIS — I130 Hypertensive heart and chronic kidney disease with heart failure and stage 1 through stage 4 chronic kidney disease, or unspecified chronic kidney disease: Secondary | ICD-10-CM | POA: Diagnosis not present

## 2021-05-11 DIAGNOSIS — I872 Venous insufficiency (chronic) (peripheral): Secondary | ICD-10-CM | POA: Diagnosis not present

## 2021-05-11 DIAGNOSIS — J449 Chronic obstructive pulmonary disease, unspecified: Secondary | ICD-10-CM | POA: Diagnosis not present

## 2021-05-11 DIAGNOSIS — I5033 Acute on chronic diastolic (congestive) heart failure: Secondary | ICD-10-CM | POA: Diagnosis not present

## 2021-05-11 DIAGNOSIS — E785 Hyperlipidemia, unspecified: Secondary | ICD-10-CM | POA: Diagnosis not present

## 2021-05-16 DIAGNOSIS — I5033 Acute on chronic diastolic (congestive) heart failure: Secondary | ICD-10-CM | POA: Diagnosis not present

## 2021-05-16 DIAGNOSIS — N183 Chronic kidney disease, stage 3 unspecified: Secondary | ICD-10-CM | POA: Diagnosis not present

## 2021-05-16 DIAGNOSIS — J449 Chronic obstructive pulmonary disease, unspecified: Secondary | ICD-10-CM | POA: Diagnosis not present

## 2021-05-16 DIAGNOSIS — E785 Hyperlipidemia, unspecified: Secondary | ICD-10-CM | POA: Diagnosis not present

## 2021-05-16 DIAGNOSIS — G459 Transient cerebral ischemic attack, unspecified: Secondary | ICD-10-CM | POA: Diagnosis not present

## 2021-05-16 DIAGNOSIS — I130 Hypertensive heart and chronic kidney disease with heart failure and stage 1 through stage 4 chronic kidney disease, or unspecified chronic kidney disease: Secondary | ICD-10-CM | POA: Diagnosis not present

## 2021-05-16 DIAGNOSIS — I700 Atherosclerosis of aorta: Secondary | ICD-10-CM | POA: Diagnosis not present

## 2021-05-16 DIAGNOSIS — I2510 Atherosclerotic heart disease of native coronary artery without angina pectoris: Secondary | ICD-10-CM | POA: Diagnosis not present

## 2021-05-16 DIAGNOSIS — I872 Venous insufficiency (chronic) (peripheral): Secondary | ICD-10-CM | POA: Diagnosis not present

## 2021-05-16 DIAGNOSIS — F32A Depression, unspecified: Secondary | ICD-10-CM | POA: Diagnosis not present

## 2021-05-19 DIAGNOSIS — I2510 Atherosclerotic heart disease of native coronary artery without angina pectoris: Secondary | ICD-10-CM | POA: Diagnosis not present

## 2021-05-19 DIAGNOSIS — I5033 Acute on chronic diastolic (congestive) heart failure: Secondary | ICD-10-CM | POA: Diagnosis not present

## 2021-05-19 DIAGNOSIS — N183 Chronic kidney disease, stage 3 unspecified: Secondary | ICD-10-CM | POA: Diagnosis not present

## 2021-05-19 DIAGNOSIS — F32A Depression, unspecified: Secondary | ICD-10-CM | POA: Diagnosis not present

## 2021-05-19 DIAGNOSIS — E785 Hyperlipidemia, unspecified: Secondary | ICD-10-CM | POA: Diagnosis not present

## 2021-05-19 DIAGNOSIS — G459 Transient cerebral ischemic attack, unspecified: Secondary | ICD-10-CM | POA: Diagnosis not present

## 2021-05-19 DIAGNOSIS — J449 Chronic obstructive pulmonary disease, unspecified: Secondary | ICD-10-CM | POA: Diagnosis not present

## 2021-05-19 DIAGNOSIS — I130 Hypertensive heart and chronic kidney disease with heart failure and stage 1 through stage 4 chronic kidney disease, or unspecified chronic kidney disease: Secondary | ICD-10-CM | POA: Diagnosis not present

## 2021-05-19 DIAGNOSIS — I872 Venous insufficiency (chronic) (peripheral): Secondary | ICD-10-CM | POA: Diagnosis not present

## 2021-05-19 DIAGNOSIS — I700 Atherosclerosis of aorta: Secondary | ICD-10-CM | POA: Diagnosis not present

## 2021-05-20 ENCOUNTER — Telehealth: Payer: Self-pay

## 2021-05-20 ENCOUNTER — Encounter: Payer: Self-pay | Admitting: Cardiology

## 2021-05-20 NOTE — Telephone Encounter (Signed)
Daughter states Bebe Liter should have faxed over FMLA forms to be completed for daughter in regard to taking care of patient.    Would like to know if there forms were received.    States forms can be duplicated from last month.  Please follow up in regard.

## 2021-05-23 NOTE — Telephone Encounter (Signed)
Patient's daughter is calling in asking for a follow up on message below.

## 2021-05-24 DIAGNOSIS — N183 Chronic kidney disease, stage 3 unspecified: Secondary | ICD-10-CM | POA: Diagnosis not present

## 2021-05-24 DIAGNOSIS — F32A Depression, unspecified: Secondary | ICD-10-CM | POA: Diagnosis not present

## 2021-05-24 DIAGNOSIS — I2510 Atherosclerotic heart disease of native coronary artery without angina pectoris: Secondary | ICD-10-CM | POA: Diagnosis not present

## 2021-05-24 DIAGNOSIS — J449 Chronic obstructive pulmonary disease, unspecified: Secondary | ICD-10-CM | POA: Diagnosis not present

## 2021-05-24 DIAGNOSIS — I130 Hypertensive heart and chronic kidney disease with heart failure and stage 1 through stage 4 chronic kidney disease, or unspecified chronic kidney disease: Secondary | ICD-10-CM | POA: Diagnosis not present

## 2021-05-24 DIAGNOSIS — I700 Atherosclerosis of aorta: Secondary | ICD-10-CM | POA: Diagnosis not present

## 2021-05-24 DIAGNOSIS — G459 Transient cerebral ischemic attack, unspecified: Secondary | ICD-10-CM | POA: Diagnosis not present

## 2021-05-24 DIAGNOSIS — I5033 Acute on chronic diastolic (congestive) heart failure: Secondary | ICD-10-CM | POA: Diagnosis not present

## 2021-05-24 DIAGNOSIS — I872 Venous insufficiency (chronic) (peripheral): Secondary | ICD-10-CM | POA: Diagnosis not present

## 2021-05-24 DIAGNOSIS — E785 Hyperlipidemia, unspecified: Secondary | ICD-10-CM | POA: Diagnosis not present

## 2021-05-24 NOTE — Telephone Encounter (Signed)
FMLA form placed on Dr [name] for signature

## 2021-05-24 NOTE — Telephone Encounter (Signed)
Form received

## 2021-05-24 NOTE — Telephone Encounter (Signed)
[name], please see message.

## 2021-05-25 ENCOUNTER — Ambulatory Visit: Payer: Medicare Other | Admitting: Neurology

## 2021-05-25 DIAGNOSIS — M25559 Pain in unspecified hip: Secondary | ICD-10-CM | POA: Diagnosis not present

## 2021-05-25 DIAGNOSIS — R2681 Unsteadiness on feet: Secondary | ICD-10-CM | POA: Diagnosis not present

## 2021-05-25 DIAGNOSIS — I509 Heart failure, unspecified: Secondary | ICD-10-CM | POA: Diagnosis not present

## 2021-05-25 DIAGNOSIS — R269 Unspecified abnormalities of gait and mobility: Secondary | ICD-10-CM | POA: Diagnosis not present

## 2021-05-26 ENCOUNTER — Other Ambulatory Visit: Payer: Self-pay | Admitting: Family Medicine

## 2021-05-27 ENCOUNTER — Telehealth: Payer: Self-pay

## 2021-05-27 NOTE — Telephone Encounter (Signed)
[name] is calling in requesting verbal orders to move plan of care visit to Saturday.

## 2021-05-27 NOTE — Telephone Encounter (Signed)
Returned call to [place] and VO given.

## 2021-05-27 NOTE — Telephone Encounter (Signed)
FMLA Faxed

## 2021-05-31 ENCOUNTER — Telehealth: Payer: Self-pay

## 2021-05-31 DIAGNOSIS — G40909 Epilepsy, unspecified, not intractable, without status epilepticus: Secondary | ICD-10-CM

## 2021-05-31 DIAGNOSIS — I10 Essential (primary) hypertension: Secondary | ICD-10-CM

## 2021-05-31 DIAGNOSIS — I5032 Chronic diastolic (congestive) heart failure: Secondary | ICD-10-CM

## 2021-05-31 DIAGNOSIS — G4733 Obstructive sleep apnea (adult) (pediatric): Secondary | ICD-10-CM

## 2021-05-31 DIAGNOSIS — N1832 Chronic kidney disease, stage 3b: Secondary | ICD-10-CM

## 2021-05-31 NOTE — Telephone Encounter (Signed)
Received a call from [facility], they went to evaluate patient she states she has no wounds/boils on her back its just a spot on skin from where patient sits so long. Daughter is requesting a hospital bed.

## 2021-06-01 NOTE — Telephone Encounter (Signed)
Please advise 

## 2021-06-01 NOTE — Telephone Encounter (Signed)
I am ok with DME order for hospital bed.  Algis Greenhouse. [name], MD 06/01/2021 8:58 AM

## 2021-06-02 NOTE — Telephone Encounter (Signed)
Spoke to [name], told her I placed the order for a hospital bed for pt and someone should be in touch with you. [name] verbalized understanding.

## 2021-06-02 NOTE — Telephone Encounter (Signed)
Order placed for hospital bed in [place].

## 2021-06-02 NOTE — Addendum Note (Signed)
Addended by: Marian Sorrow on: 06/02/2021 10:23 AM   Modules accepted: Orders

## 2021-06-06 ENCOUNTER — Ambulatory Visit: Payer: Medicare Other | Admitting: Cardiology

## 2021-06-08 ENCOUNTER — Ambulatory Visit: Payer: Medicare Other | Admitting: Cardiology

## 2021-06-13 ENCOUNTER — Telehealth: Payer: Medicare Other

## 2021-06-13 NOTE — Progress Notes (Deleted)
Chronic Care Management Pharmacy Note  06/13/2021 Name:  [name] MRN:  [id number] DOB:  1941-10-02  Recommendations/Changes made from today's visit: No Rx changes  Subjective: [name] is an 80 y.o. year old female who is a primary patient of Vivi Barrack, MD.  The CCM team was consulted for assistance with disease management and care coordination needs.  Spoke with patient's daughter, [name], who helps with medication management.   Engaged with patient by telephone for initial visit in response to provider referral for pharmacy case management and/or care coordination services.   Consent to Services:  The patient was given information about Chronic Care Management services, agreed to services, and gave verbal consent prior to initiation of services.  Please see initial visit note for detailed documentation.   Patient Care Team: Vivi Barrack, MD as PCP - General (Family Medicine) Freada Bergeron, MD as PCP - Cardiology (Cardiology) Kathlee Nations, MD as Consulting Physician (Psychiatry) Darleen Crocker, MD as Consulting Physician (Ophthalmology) Marybelle Killings, MD as Consulting Physician (Orthopedic Surgery) Madelin Rear, [facility] as Pharmacist (Pharmacist) Recent office visits:  02/28/21-Caleb Parker MD (PCP)- Office visit for management of chronic conditions. Labs completed. DISCONTINUED Hydrodyzine 70m.  Follow up in 6 months.  01/20/21-Caleb PJerline PainMD (PCP)- Oiffice visit for rash. [facility] STARTED Fluconazole 1522mtake 1 once; STARTED Ketoconazole 2% 1 application topically twice daily and Nystatin 103825053nit/gm 1 application topically three times daily.   Recent consult visits:  03/25/21-Heather PeJohney FrameD (Cardiology)- Office visit for heart failure.No medication changes. Recommended compression wraps to the lower extremities with leg elevation, Monitor daily weights, Low Na diet.  Follow up in 3  months. 02/08/21-Syed Arfeen MD (Behavioral Health)-info unavailable without breaking the glass 01/12/21-Syed Arfeen MD (Behavioral Health)-info unavailable without breaking the glass 01/03/21-Heather Pemberton MD (Cardiology)- Telephone note- Recommended patient to use cough drops or OTC cough suppressants, use Zyrtec or Claritin. 12/31/20-Heather PeJohney FrameD (Cardiology)- Office visit for CHF. DECREASED Amlodipine from 106maily to 5mg62mke 1 daily.  DECREASED Carvedilol from 6.25mg66mer day to Carvedilol 3.125mg 10mr day with meals.  Patient reported not taking Cefpodoxime Proxetil 100mg a64metolazone 5mg. Fo36mw up in 2 months. 12/24/20-Heather [facility])-Telephone note- Dr PembertoJohney Framended switching Torsemide to 20mg eve57m,W, F and 40mg ever4m Thur, Sat, Sunday and repeat BMET in 14days. Patient's daughter reported she is no longer taking Torsemide and is on Lasix 80mg per d31m 12/08/20- [facility])-Data unavailable 12/01/20-Syed Arfeen MD (Psychiatry)data unavailable 11/30/20- [facility])-Data unavailable 10/20/21-Heather Pemberton [facility])-No medication changes.  Labs completed. Follow up in 1 month 10/07/21-Heather Pemberton [facility])-Office visit for CHF, Labs completed.  STARTED Torsemide 40mg take 173mly. INCREASED Carvedilol from 3.125 take 1 twice daily to Carvedilol 6.25mg take 1 75me daily.  DISCONTINUED Furosemide 40mg take 1 d14m.  Followup in 2 weeks.    Hospital visits:  #2        Medication Reconciliation was completed by comparing discharge summary, patient's EMR and Pharmacy list, and upon discussion with patient.   Admitted to the hospital on 03/26/21 due to TIA. Discharge date was 03/29/21. [facility]started Depakote 250mg take 1 tw43mdaily due to seizure like activity   Medication Changes at Hospital  Discharge: -Changed None Medications Discontinued at Hospital Discharge: -Stopped None   Medications that remain the same after Hospital Discharge:??  -  All other medications will remain the same.       #1        Medication Reconciliation was completed by comparing discharge summary, patient's EMR and Pharmacy list, and upon discussion with patient.   Admitted to the hospital on 10/13/21 due to chest pain. Discharge date was 10/14/21. Discharged from [place]?Medications Started at [facility] Discharge:?? -started Lasix in the ED   Medication Changes at Hospital Discharge: -Changed None Medications Discontinued at Hospital Discharge: -Stopped None   Medications that remain the same after Hospital Discharge:??  -All other medications will remain the same.     Objective:  Lab Results  Component Value Date   CREATININE 1.64 (H) 03/29/2021   CREATININE 1.42 (H) 03/28/2021   CREATININE 1.41 (H) 03/27/2021    Lab Results  Component Value Date   HGBA1C 6.7 (H) 03/27/2021   Last diabetic Eye exam: No results found for: HMDIABEYEEXA  Last diabetic Foot exam: No results found for: HMDIABFOOTEX      Component Value Date/Time   CHOL 189 03/27/2021 0535   TRIG 134 03/27/2021 0535   HDL 36 (L) 03/27/2021 0535   CHOLHDL 5.3 03/27/2021 0535   VLDL 27 03/27/2021 0535   LDLCALC 126 (H) 03/27/2021 0535   LDLCALC 137 (H) 08/30/2020 1546    Hepatic Function Latest Ref Rng & Units 03/26/2021 02/28/2021 01/25/2021  Total Protein 6.5 - 8.1 g/dL 7.9 7.0 -  Albumin 3.5 - 5.0 g/dL 4.0 4.2 4.5  AST 15 - 41 U/L 35 14 -  ALT 0 - 44 U/L 16 16 -  Alk Phosphatase 38 - 126 U/L 114 120(H) -  Total Bilirubin 0.3 - 1.2 mg/dL 1.3(H) 0.4 -  Bilirubin, Direct (0.0-0.3) mg/dL - - -    Lab Results  Component Value Date/Time   TSH 4.82 (H) 02/28/2021 03:03 PM   TSH 4.62 (H) 08/30/2020 03:46 PM   FREET4 1.04 05/26/2011 02:51 PM   FREET4 1.06 05/27/2010 05:05 PM    CBC  Latest Ref Rng & Units 03/27/2021 03/26/2021 02/28/2021  WBC 4.0 - 10.5 K/uL 7.2 8.4 7.3  Hemoglobin 12.0 - 15.0 g/dL 12.0 12.7 12.8  Hematocrit 36.0 - 46.0 % 36.0 39.0 38.7  Platelets 150 - 400 K/uL 194 208 184.0    No results found for: VD25OH  Clinical ASCVD: Yes  The 10-year ASCVD risk score Mikey Bussing DC Jr., et al., Dec 19, 2011) is: 13%   Values used to calculate the score:     Age: 89 years     Sex: Female     Is Non-Hispanic African American: Yes     Diabetic: No     Tobacco smoker: No     Systolic Blood Pressure: 607 mmHg     Is BP treated: Yes     HDL Cholesterol: 36 mg/dL     Total Cholesterol: 189 mg/dL    Social History   Tobacco Use  Smoking Status Former   Packs/day: 0.10   Years: 35.00   Pack years: 3.50   Types: Cigarettes   Quit date: 04/08/2013   Years since quitting: 8.1  Smokeless Tobacco Never  Tobacco Comments   smoked a few when her son died in Dec 18, 2012    BP Readings from Last 3 Encounters:  04/26/21 122/82  04/14/21 139/75  04/01/21 (!) 151/68   Pulse Readings from Last 3 Encounters:  04/26/21 69  04/14/21 66  04/01/21 76   Wt Readings from Last 3 Encounters:  04/01/21 236 lb (107 kg)  03/28/21 270 lb 11.6 oz (122.8 kg)  03/25/21 265 lb 6.4 oz (120.4 kg)    Assessment: Review of patient past medical history, allergies, medications, health status, including review of consultants reports, laboratory and other test data, was performed as part of comprehensive evaluation and provision of chronic care management services.   SDOH:  (Social Determinants of Health) assessments and interventions performed: Yes  CCM Care Plan  Allergies  Allergen Reactions   Aspirin Other (See Comments)    Bleeding from vagina    Sulfamethoxazole-Trimethoprim Itching    Medications Reviewed Today     Reviewed by Betti Cruz, RMA (Registered Medical Assistant) on 04/26/21 at 1113  Med List Status: <None>   Medication Order Taking? Sig Documenting Provider Last  Dose Status Informant  acetaminophen (TYLENOL) 500 MG tablet [id number]  Take 500 mg by mouth at bedtime.  [provider]  Active Child  albuterol (VENTOLIN HFA) 108 (90 Base) MCG/ACT inhaler [id number]  Inhale 2 puffs into the lungs every 6 (six) hours as needed for wheezing or shortness of breath. Vivi Barrack, MD  Active Child  allopurinol (ZYLOPRIM) 300 MG tablet [id number]  TAKE 1 TABLET BY MOUTH  DAILY  Patient taking differently: Take 300 mg by mouth daily.   Vivi Barrack, MD  Active   amLODipine (NORVASC) 5 MG tablet [id number]  Take 1 tablet (5 mg total) by mouth daily. Freada Bergeron, MD  Expired 04/25/21 2359 Child  ascorbic acid (VITAMIN C) 500 MG tablet [id number]  Take 500 mg by mouth daily.  [provider]  Active Child  atorvastatin (LIPITOR) 40 MG tablet [id number]  TAKE 1 TABLET BY MOUTH  DAILY  Patient taking differently: Take 40 mg by mouth daily.   Vivi Barrack, MD  Active   budesonide (PULMICORT) 0.25 MG/2ML nebulizer solution [id number]  Take 2 mLs (0.25 mg total) by nebulization 2 (two) times daily. Freada Bergeron, MD  Active Child  buPROPion (WELLBUTRIN XL) 300 MG 24 hr tablet [id number]  Take 1 tablet (300 mg total) by mouth every morning. Kathlee Nations, MD  Active   carvedilol (COREG) 3.125 MG tablet [id number]  Take 1 tablet (3.125 mg total) by mouth 2 (two) times daily with a meal. Freada Bergeron, MD  Active Child  Cyanocobalamin (VITAMIN B12) 1000 MCG TBCR [id number]  Take 1 tablet by mouth daily. [provider]  Active Child  diclofenac Sodium (VOLTAREN) 1 % GEL [id number]  Apply 4 g topically 4 (four) times daily as needed (for pain). [provider]  Active Child  donepezil (ARICEPT) 5 MG tablet [id number]  Take 1 tablet (5 mg total) by mouth at bedtime. Kathlee Nations, MD  Active Child  fluticasone [facility]) 50 MCG/ACT nasal spray [id number]  Place into both nostrils as needed for allergies or rhinitis. [provider]  Active Child  furosemide (LASIX) 80 MG tablet [id number]  Take 80 mg by mouth daily. Claudia Desanctis, MD  Active Child  ipratropium-albuterol (DUONEB) 0.5-2.5 (3) MG/3ML SOLN [id number]  Take 3 mLs by nebulization 2 (two) times daily. Swayze, Ava, DO  Active Child  ketoconazole (NIZORAL) 2 % cream [id number]  Apply 1 application topically 2 (two) times daily. Vivi Barrack, MD  Active Child  lamoTRIgine (LAMICTAL) 25 MG tablet [id number]  Take tablet twice a day for 2 weeks, then increase to 2 tablets twice a day Cameron Sprang, MD  Active   losartan (  COZAAR) 100 MG tablet [id number]  Take 1 tablet (100 mg total) by mouth daily. Vivi Barrack, MD  Active Child  metolazone (ZAROXOLYN) 5 MG tablet [id number]  Take 5 mg by mouth. Twice weekly as needed Claudia Desanctis, MD  Active Family Member  nystatin (MYCOSTATIN/NYSTOP) powder [id number]  Apply 1 application topically 3 (three) times daily. Vivi Barrack, MD  Active Child  OLANZapine [facility]) 2.5 MG tablet [id number]  Take 1 tab at bedtime Arfeen, Arlyce Harman, MD  Active   pantoprazole (PROTONIX) 40 MG tablet [id number]  TAKE 1 TABLET BY MOUTH  TWICE DAILY BEFORE A MEAL  Patient taking differently: Take 40 mg by mouth 2 (two) times daily before a meal. TAKE 1 TABLET BY MOUTH  TWICE DAILY BEFORE A MEAL   Vivi Barrack, MD  Active   traMADol (ULTRAM) 50 MG tablet [id number]  TAKE 1 TABLET BY MOUTH  TWICE DAILY AS NEEDED  Patient not taking: Reported on 04/14/2021   Vivi Barrack, MD  Active             Patient Active Problem List   Diagnosis Date Noted   Seizure disorder ([place]) 03/29/2021   Acute on chronic diastolic CHF (congestive heart failure) ([place]) 03/26/2021   Obesity, Class III, BMI 40-49.9 (morbid obesity) ([place]) 03/26/2021   Dementia ([place]) 03/26/2021   Debility 11/06/2019   Venous stasis of lower extremity 10/14/2019   Hyperglycemia 05/06/2018   Constipation 04/30/2018   Osteoarthritis 01/31/2018   Lower extremity  edema 07/31/2017   History of bilateral hip replacements    Memory disorder 12/20/2016   History of lumbar laminectomy for spinal cord decompression 02/02/2016   Atopic dermatitis 07/09/2015   Reactive airway disease 08/11/2014   History of cancer 08/11/2014   OSA (obstructive sleep apnea) 08/11/2014   Chronic kidney disease (CKD), stage III (moderate) (HCC) 10/04/2012   Schizophrenia, chronic condition ([place]) 01/20/2012   Postmenopausal atrophic vaginitis 11/01/2010   Chronic diastolic heart failure (HCC) 05/06/2010   Allergic rhinitis 10/14/2009   Obesity, Class II, BMI 35-39.9, with comorbidity 05/14/2009   Chronic prescription opiate use 04/29/2008   Normocytic anemia 09/25/2007   Hyperlipidemia 09/03/2006   Gout 09/03/2006   Essential hypertension 09/03/2006   GERD 09/03/2006    Immunization History  Administered Date(s) Administered   Fluad Quad(high Dose 65+) 06/27/2019, 08/30/2020   Influenza Split 08/03/2011, 07/30/2012   Influenza Whole 08/09/2007, 07/27/2008, 07/26/2009, 07/20/2010   Influenza, High Dose Seasonal PF 07/17/2017, 08/01/2018   Influenza,inj,Quad PF,6+ Mos 07/17/2013, 07/08/2015, 08/03/2016   Influenza-Unspecified 07/18/2014   PFIZER(Purple Top)SARS-COV-2 Vaccination 01/01/2020, 01/27/2020   Pneumococcal Conjugate-13 02/11/2015   Pneumococcal Polysaccharide-23 08/26/2010, 11/02/2016   Td 05/07/2009   Zoster, Live 10/09/2012    Conditions to be addressed/monitored: HTN, CHF, OA, CKD III, OSA, Morbid Obesity, GERD, hyperglycemia, schizophrenia, seziure disorder, venous stasis of lower extremity  There are no care plans that you recently modified to display for this patient.   Medication Assistance: None required.  Patient affirms current coverage meets needs.  Patient's preferred pharmacy is:  [employer], television/film/video  [facility]) - [place], [address] [place] [address] Phone: [phone]  Fax: [place] [address] [place]), [place] - [address] [place]) [place] [address] Phone: [phone] Fax: [phone]  Follow Up:  Patient agrees to Care Plan and Follow-up.  Plan:  CPA call 1 month, review BP  readings, see if patient's daughter had any luck obtaining scale. [place] f/u telephone visit 2 months  Future Appointments  Date Time Provider [place]  06/13/2021  3:30 PM [facility] CCM PHARMACIST [facility]  06/29/2021  2:00 PM Cameron Sprang, MD [facility] None  07/13/2021  3:20 PM Arfeen, Arlyce Harman, MD [facility] None  09/01/2021  2:40 PM Vivi Barrack, MD [facility] P[facility]  11/02/2021  9:00 AM Freada Bergeron, MD [facility]   Beverly Milch, PharmD Clinical Pharmacist [phone]   Current Barriers:  Unable to maintain control of CHF, HLD  Pharmacist Clinical Goal(s):  Patient will contact provider office for questions/concerns as evidenced notation of same in electronic health record through collaboration with PharmD and provider.   Interventions: 1:1 collaboration with Vivi Barrack, MD regarding development and update of comprehensive plan of care as evidenced by provider attestation and co-signature Inter-disciplinary care team collaboration (see longitudinal plan of care) Comprehensive medication review performed; medication list updated in electronic medical record CHF counseling   Patient Goals/Self-Care Activities Patient will:  - take medications as prescribed   Hypertension(BP goal <130/80) -Controlled -CKD III-IV - follows with Harrie Jeans, HFpEF -Current treatment: Carvedilol 3.125 mg twice daily Amlodipine 5 mg once daily Losartan 100 mg daily  -Current home readings: testing some at home, 120s/60s today -Current dietary habits: some processed foods, no added salt -Current exercise habits: no routine exercise -Denies hypotensive/hypertensive symptoms -Educated on BP goals  and benefits of medications for prevention of heart attack, stroke and kidney damage; -Counseled to monitor BP at home daily, document, and provide log at future appointments -Counseled on diet and exercise extensively Recommended salt restricted diet <2g/day  Hyperlipidemia: (LDL goal < 70) -Not ideally controlled -Current treatment: Atorvastatin 40 mg once daily -No side effects noted -Educated on Cholesterol goals;  -Recommended to continue current medication  Gout (Goal: minimize symptoms) -Controlled, no recent episodes -Denies any issues with gout past two years -Current treatment  Allopurinol 300 mg once daily -Medications previously tried: n/a -Reviewed low purine diet/potential dietary triggers - some processed meat and soda mentioned -Recommended to continue current medication  Heart Failure (Goal: manage symptoms and prevent exacerbations) -Controlled -Last ejection fraction: 65-70% (Date: 08/2020) -Daughter has been keeping up keeping furosemide 80 mg twice daily -HF type: Diastolic -Unable to do daily weights. Difficulty ambulating. Rather they watch fluid retention in legs  -Current treatment: Furosemide 80 mg once daily (Dr Royce Macadamia) Metolazone 5 mg twice a week as needed (Dr Royce Macadamia) -Educated on Benefits of medications for managing symptoms and prolonging life Importance of weighing daily; speak with cardiology regarding alternative scales in patients not able to ambulate -Recommended to continue current medication  GERD (goal: minimize symptoms) -Not ideally controlled -some late night eating -Current regmimen Pantoprazole 40 mg twice daily  Reviewed triggers including soda, chocolate, late night eating. Discussed famotidine as preferred over Tums for breakthrough reflux due to potential for constipation Recommended to continue current medication

## 2021-06-15 DIAGNOSIS — D649 Anemia, unspecified: Secondary | ICD-10-CM | POA: Diagnosis not present

## 2021-06-15 DIAGNOSIS — J45909 Unspecified asthma, uncomplicated: Secondary | ICD-10-CM | POA: Diagnosis not present

## 2021-06-15 DIAGNOSIS — I5033 Acute on chronic diastolic (congestive) heart failure: Secondary | ICD-10-CM | POA: Diagnosis not present

## 2021-06-15 DIAGNOSIS — I129 Hypertensive chronic kidney disease with stage 1 through stage 4 chronic kidney disease, or unspecified chronic kidney disease: Secondary | ICD-10-CM | POA: Diagnosis not present

## 2021-06-15 DIAGNOSIS — E1122 Type 2 diabetes mellitus with diabetic chronic kidney disease: Secondary | ICD-10-CM | POA: Diagnosis not present

## 2021-06-15 DIAGNOSIS — N1832 Chronic kidney disease, stage 3b: Secondary | ICD-10-CM | POA: Diagnosis not present

## 2021-06-15 DIAGNOSIS — N179 Acute kidney failure, unspecified: Secondary | ICD-10-CM | POA: Diagnosis not present

## 2021-06-15 DIAGNOSIS — E876 Hypokalemia: Secondary | ICD-10-CM | POA: Diagnosis not present

## 2021-06-15 LAB — BASIC METABOLIC PANEL
BUN: 10 (ref 4–21)
CO2: 26 — AB (ref 13–22)
Chloride: 108 (ref 99–108)
Creatinine: 1.2 — AB (ref 0.5–1.1)
Glucose: 116
Potassium: 3.8 (ref 3.4–5.3)
Sodium: 144 (ref 137–147)

## 2021-06-15 LAB — COMPREHENSIVE METABOLIC PANEL
Albumin: 4.3 (ref 3.5–5.0)
Calcium: 9.9 (ref 8.7–10.7)
GFR calc Af Amer: 47

## 2021-06-15 LAB — CBC AND DIFFERENTIAL: Hemoglobin: 13.3 (ref 12.0–16.0)

## 2021-06-20 ENCOUNTER — Encounter: Payer: Self-pay | Admitting: Family Medicine

## 2021-06-21 ENCOUNTER — Other Ambulatory Visit (HOSPITAL_COMMUNITY): Payer: Self-pay | Admitting: Psychiatry

## 2021-06-21 DIAGNOSIS — F200 Paranoid schizophrenia: Secondary | ICD-10-CM

## 2021-06-22 ENCOUNTER — Other Ambulatory Visit: Payer: Self-pay | Admitting: Family Medicine

## 2021-06-25 DIAGNOSIS — R2681 Unsteadiness on feet: Secondary | ICD-10-CM | POA: Diagnosis not present

## 2021-06-25 DIAGNOSIS — I509 Heart failure, unspecified: Secondary | ICD-10-CM | POA: Diagnosis not present

## 2021-06-25 DIAGNOSIS — R269 Unspecified abnormalities of gait and mobility: Secondary | ICD-10-CM | POA: Diagnosis not present

## 2021-06-25 DIAGNOSIS — M25559 Pain in unspecified hip: Secondary | ICD-10-CM | POA: Diagnosis not present

## 2021-06-29 ENCOUNTER — Other Ambulatory Visit: Payer: Self-pay

## 2021-06-29 ENCOUNTER — Encounter: Payer: Self-pay | Admitting: Neurology

## 2021-06-29 ENCOUNTER — Ambulatory Visit (INDEPENDENT_AMBULATORY_CARE_PROVIDER_SITE_OTHER): Payer: Medicare Other | Admitting: Neurology

## 2021-06-29 VITALS — BP 149/66 | HR 74

## 2021-06-29 DIAGNOSIS — F0391 Unspecified dementia with behavioral disturbance: Secondary | ICD-10-CM | POA: Diagnosis not present

## 2021-06-29 DIAGNOSIS — G40209 Localization-related (focal) (partial) symptomatic epilepsy and epileptic syndromes with complex partial seizures, not intractable, without status epilepticus: Secondary | ICD-10-CM

## 2021-06-29 DIAGNOSIS — F03B18 Unspecified dementia, moderate, with other behavioral disturbance: Secondary | ICD-10-CM

## 2021-06-29 MED ORDER — LAMOTRIGINE 25 MG PO TABS: ORAL_TABLET | ORAL | 3 refills | Status: DC

## 2021-06-29 MED ORDER — DONEPEZIL HCL 5 MG PO TABS: 5.0000 mg | ORAL_TABLET | Freq: Every day | ORAL | 3 refills | Status: DC

## 2021-06-29 NOTE — Progress Notes (Signed)
NEUROLOGY FOLLOW UP OFFICE NOTE  [name] OA:[id number] 29-Nov-1940  HISTORY OF PRESENT ILLNESS: I had the pleasure of seeing [name] in follow-up in the neurology clinic on 06/29/2021.  The patient was last seen 3 months ago for dementia and left temporal lobe epilepsy. She is again accompanied by her daughter [name] who helps supplement the history today.  Records and images were personally reviewed where available.  Since her last visit, she has started Lamotrigine '50mg'$  BID with no seizures since 03/26/21. No side effects. She is on Donepezil '5mg'$  daily and olanzapine 2.'5mg'$  qhs for dementia with behavioral disturbance. She lives with her son, [name] works remotely from the patient's home and fixes her pillbox, administering medications together with her brother. [name] manages finances. The agitation is much better, her son was upsetting her. No hallucinations. Sometimes she might talk about the people on TV like they are there. Sometimes she gets a little more confused, talking out of her head, answering questions not asked. For the most part she knows her daughter's name. She continues to see Psychiatry. She has a rollator but does not get up as much. No falls. Sleep is a little better, sometimes she sleeps all night.    History on Initial Assessment 04/01/2021: This is an 80 year old left-handed woman with a history of hypertension, hyperlipidemia, paranoid schizophrenia, presenting for evaluation of memory loss and new onset seizure. She is accompanied by her daughter [name] who helps supplement the history today. She was previously evaluated by neurologist Dr. Jannifer Franklin in 2018 for memory loss that started around 2017. She felt something was different herself. MMSE 29/30 at that time. She was found to have bilateral subdural hematomas on brain MRI in 12/2016 after a fall. She had a burr hole and "went through hell and back." She got better to the point where she was doing things, putting her  medications in her pillbox and taking them. [name] took over finances in 2018. In 2019, she became belligerent. She does not drive. She was started on Donepezil '5mg'$  daily. Memantine was started in 2019 but she had side effects of more confusion, insomnia. She was lost to follow-up but has continued to see her psychiatrist. Her daughter reports that "every January is when it happens." In January 2020, DSS called and told [name] she needed to get legal guardianship, she had been calling/threatening people and stopped her medications. Since then, [name] took over medications. That year she could still carry on a conversation. In 2021, it "all went away," she would ramble and could not hold a thought. She started needing help with bathing. Last January 2022, she started ripping her shirt, blanket out of the blue. She called Dr. Adele Schilder and Donepezil was restarted which helped after a couple of days. She was also started on olanzapine. Since then, she has been much better, she is not ripping things anymore, family gives her fidget toys which helps. She has urinary incontinence and wears Depends. Rare bowel incontinence. She does not sleep more than 3 hours at a time, up at night and sleeping in the daytime. She is fidgety sometimes but not like in January.   She was [facility] to [facility] after an episode of generalized shaking, transient unresponsiveness with tongue hanging out of her mouth, leaning to the left side. This lasted 15 seconds, they noticed left side was weaker after then improved. 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  Her EEG was abnormal with intermittent left frontal theta slowing, single spike in the left frontotemporal region. She was discharged home on Depakote '500mg'$  BID but started complaining of a headache after 3 doses. She mostly used to have headaches when her BP is high. She does not recall being admitted to the  hospital. Her son lives with her, [name] is her legal guardian and stays the night. Family administers medications. Her mother and grandmother had dementia. No family history of seizures.     Laboratory Data: Lab Results  Component Value Date   WBC 7.2 03/27/2021   HGB 13.3 06/15/2021   HCT 36.0 03/27/2021   MCV 93.5 03/27/2021   PLT 194 03/27/2021     Chemistry      Component Value Date/Time   NA 144 06/15/2021 0000   K 3.8 06/15/2021 0000   CL 108 06/15/2021 0000   CO2 26 (A) 06/15/2021 0000   BUN 10 06/15/2021 0000   CREATININE 1.2 (A) 06/15/2021 0000   CREATININE 1.64 (H) 03/29/2021 0632   CREATININE 1.43 (H) 09/22/2020 1528   GLU 116 06/15/2021 0000      Component Value Date/Time   CALCIUM 9.9 06/15/2021 0000   ALKPHOS 114 03/26/2021 0813   AST 35 03/26/2021 0813   ALT 16 03/26/2021 0813   BILITOT 1.3 (H) 03/26/2021 0813      PAST MEDICAL HISTORY: Past Medical History:  Diagnosis Date   CHF (congestive heart failure) (HCC)    Chronic venous insufficiency    Colon, diverticulosis 06/2010   outpatient colonoscopy by Dr. Cristina Gong.  Need record   DDD (degenerative disc disease), lumbosacral    Depression    DJD (degenerative joint disease), multiple sites    Low back pain worst   GERD (gastroesophageal reflux disease)    Gout    Gout    Uric Acid level 4.2 on 300 allopurinol   History of cervical cancer 1972   Hx-TIA (transient ischemic attack) 05/13/2001   Right facial numbness   Hx-TIA (transient ischemic attack) 05/13/2001   Looking back in Epic, the patient had a hospitalization in July 2002 for evaluation of questionable TIA (s/s of right facial numbness associated with mild blurred vision in her right eye, diaphoresis, and mild confusion) with negative CT head and negative MRI/MRA and was started on Plavix after being heparinized. She was to have possible follow up with Neurology for reevaluation of the need for Pl   Hyperlipidemia    Hypertension     Keloid 08/01/2018   Memory disorder 12/20/2016   Mitral valve prolapse 10/27/1999   Subsequent 2D echo show normal mitral valve   Osteoarthritis of hip    bilateral hips   Ovarian cancer ([place]) 1972   S/P oophorectomy   Psychosis ([place])    Recurrent boils    History of MRSA skin infections with abscess   Seizures ([place])    Sensorineural hearing loss of both ears    Subdural hematoma ([place]) 01/26/2017   bilateral   Uncomplicated opioid dependence ([place]) 01/31/2018    MEDICATIONS: Current Outpatient Medications on File Prior to Visit  Medication Sig Dispense Refill   acetaminophen (TYLENOL) 500 MG tablet Take 500 mg by mouth at bedtime.      albuterol (VENTOLIN HFA) 108 (90 Base) MCG/ACT inhaler Inhale 2 puffs into the lungs every 6 (six) hours as needed for wheezing or shortness of breath. 8 g 0   allopurinol (ZYLOPRIM) 300 MG tablet TAKE 1 TABLET BY MOUTH  DAILY 90  tablet 3   amLODipine (NORVASC) 5 MG tablet Take 1 tablet (5 mg total) by mouth daily. 90 tablet 3   atorvastatin (LIPITOR) 40 MG tablet TAKE 1 TABLET BY MOUTH  DAILY 90 tablet 3   azelastine (ASTELIN) 0.1 % nasal spray Place 2 sprays into both nostrils 2 (two) times daily. 30 mL 12   budesonide (PULMICORT) 0.25 MG/2ML nebulizer solution Take 2 mLs (0.25 mg total) by nebulization 2 (two) times daily. 120 mL 2   buPROPion (WELLBUTRIN XL) 300 MG 24 hr tablet Take 1 tablet (300 mg total) by mouth every morning. 90 tablet 0   carvedilol (COREG) 3.125 MG tablet Take 1 tablet (3.125 mg total) by mouth 2 (two) times daily with a meal. 180 tablet 3   Cyanocobalamin (VITAMIN B12) 1000 MCG TBCR Take 1 tablet by mouth daily.     diclofenac Sodium (VOLTAREN) 1 % GEL Apply 4 g topically 4 (four) times daily as needed (for pain).     donepezil (ARICEPT) 5 MG tablet Take 1 tablet (5 mg total) by mouth at bedtime. 90 tablet 3   fluticasone (FLONASE) 50 MCG/ACT nasal spray Place into both nostrils as needed for allergies or rhinitis.      furosemide (LASIX) 80 MG tablet Take 80 mg by mouth daily.     ipratropium-albuterol (DUONEB) 0.5-2.5 (3) MG/3ML SOLN Take 3 mLs by nebulization 2 (two) times daily. 360 mL 0   ketoconazole (NIZORAL) 2 % cream Apply 1 application topically 2 (two) times daily. 60 g 0   lamoTRIgine (LAMICTAL) 25 MG tablet Take tablet twice a day for 2 weeks, then increase to 2 tablets twice a day 120 tablet 6   losartan (COZAAR) 100 MG tablet TAKE 1 TABLET BY MOUTH  DAILY 90 tablet 3   nystatin (MYCOSTATIN/NYSTOP) powder Apply 1 application topically 3 (three) times daily. 60 g 0   OLANZapine (ZYPREXA) 2.5 MG tablet Take 1 tab at bedtime 90 tablet 0   pantoprazole (PROTONIX) 40 MG tablet TAKE 1 TABLET BY MOUTH  TWICE DAILY BEFORE A MEAL (Patient taking differently: Take 40 mg by mouth 2 (two) times daily before a meal. TAKE 1 TABLET BY MOUTH  TWICE DAILY BEFORE A MEAL) 180 tablet 3   ascorbic acid (VITAMIN C) 500 MG tablet Take 500 mg by mouth daily.  (Patient not taking: Reported on 06/29/2021)     lactobacillus (FLORANEX/LACTINEX) PACK Take 1 packet (1 g total) by mouth 3 (three) times daily with meals. (Patient not taking: Reported on 06/29/2021) 12 each 5   No current facility-administered medications on file prior to visit.    ALLERGIES: Allergies  Allergen Reactions   Aspirin Other (See Comments)    Bleeding from vagina    Sulfamethoxazole-Trimethoprim Itching    FAMILY HISTORY: Family History  Problem Relation Age of Onset   Diabetes Mother    Heart disease Mother    Hearing loss Mother    Stroke Mother    Post-traumatic stress disorder Father    Schizophrenia Maternal Grandmother    Diabetes Maternal Grandfather    Other Brother        brother died in mental health [facility]   Kidney cancer Neg Hx    Bladder Cancer Neg Hx     SOCIAL HISTORY: Social History   Socioeconomic History   Marital status: Widowed    Spouse name: Not on file   Number of children: 3   Years of education: 15    Highest education level: Not on  file  Occupational History   Occupation: Retired  Tobacco Use   Smoking status: Former    Packs/day: 0.10    Years: 35.00    Pack years: 3.50    Types: Cigarettes    Quit date: 04/08/2013    Years since quitting: 8.2   Smokeless tobacco: Never   Tobacco comments:    smoked a few when her son died in 01-25-2013   Vaping Use   Vaping Use: Never used  Substance and Sexual Activity   Alcohol use: No    Alcohol/week: 0.0 standard drinks   Drug use: No   Sexual activity: Never  Other Topics Concern   Not on file  Social History Narrative   Lives with daughter in [place]. Daughter is primary caretaker, surrogate decision-maker, and arranges for [facility] aide and other caregivers to supervise Ms. Trusty at home. Ambulatory   Social Determinants of Health   Financial Resource Strain: Not on file  Food Insecurity: Not on file  Transportation Needs: Not on file  Physical Activity: Not on file  Stress: Not on file  Social Connections: Not on file  Intimate Partner Violence: Not on file     PHYSICAL EXAM: Vitals:   06/29/21 1337  BP: (!) 149/66  Pulse: 74  SpO2: 96%   General: No acute distress Head:  Normocephalic/atraumatic Skin/Extremities: No rash, no edema Neurological Exam: alert and oriented to person. Speech is tangential, but she is also hard of hearing. When asked about her age, she said "old as hell." Able to follow simple commands. Fund of knowledge is reduced. Recent and remote memory are impaired.  Attention and concentration are reduced. Cranial nerves: Pupils equal, round. Extraocular movements intact with no nystagmus. Visual fields full.  No facial asymmetry.  Motor: Bulk and tone normal, muscle strength 5/5 throughout with no pronator drift.   Finger to nose testing intact.  Gait not tested, sitting on wheelchair.    IMPRESSION: This is a 80 yo LH woman with a history of hypertension, hyperlipidemia, paranoid schizophrenia, with  moderate dementia with behavioral disturbance and left temporal lobe epilepsy. She has been seizure-free since 02/2021 on Lamotrigine '50mg'$  BID. Continue Donepezil '5mg'$  daily. Agitation has been quiet, continue follow-up with Psychiatry. Continue 24/7 care. Follow-up in 6 months, they know to call for any changes.    Thank you for allowing me to participate in her care.  Please do not hesitate to call for any questions or concerns.   Ellouise Newer, M.D.   CC: Dr. [name]

## 2021-06-29 NOTE — Patient Instructions (Signed)
Good to see you! Continue Lamotrigine '25mg'$ : Take 2 tablets twice a day and Donepezil '10mg'$  daily. Continue 24/7 care. Follow-up in 6 months, call for any changes   Seizure Precautions: 1. If medication has been prescribed for you to prevent seizures, take it exactly as directed.  Do not stop taking the medicine without talking to your doctor first, even if you have not had a seizure in a long time.   2. Avoid activities in which a seizure would cause danger to yourself or to others.  Don't operate dangerous machinery, swim alone, or climb in high or dangerous places, such as on ladders, roofs, or girders.  Do not drive unless your doctor says you may.  3. If you have any warning that you may have a seizure, lay down in a safe place where you can't hurt yourself.    4.  No driving for 6 months from last seizure, as per [facility].   Please refer to the following link on the [place] website for more information: http://[email or web address]/[email or web address]/[email or web address]   5.  Maintain good sleep hygiene.   6.  Contact your doctor if you have any problems that may be related to the medicine you are taking.  7.  Call 911 and bring the patient back to the ED if:        A.  The seizure lasts longer than 5 minutes.       B.  The patient doesn't awaken shortly after the seizure  C.  The patient has new problems such as difficulty seeing, speaking or moving  D.  The patient was injured during the seizure  E.  The patient has a temperature over 102 F (39C)  F.  The patient vomited and now is having trouble breathing        FALL PRECAUTIONS: Be cautious when walking. Scan the area for obstacles that may increase the risk of trips and falls. When getting up in the mornings, sit up at the edge of the bed for a few minutes before getting out of bed. Consider elevating the bed at the head end to avoid drop of blood pressure when getting up. Walk always  in a well-lit room (use night lights in the walls). Avoid area rugs or power cords from appliances in the middle of the walkways. Use a walker or a cane if necessary and consider physical therapy for balance exercise. Get your eyesight checked regularly.  HOME SAFETY: Consider the safety of the kitchen when operating appliances like stoves, microwave oven, and blender. Consider having supervision and share cooking responsibilities until no longer able to participate in those. Accidents with firearms and other hazards in the house should be identified and addressed as well.  ABILITY TO BE LEFT ALONE: If patient is unable to contact 911 operator, consider using LifeLine, or when the need is there, arrange for someone to stay with patients. Smoking is a fire hazard, consider supervision or cessation. Risk of wandering should be assessed by caregiver and if detected at any point, supervision and safe proof recommendations should be instituted.   RECOMMENDATIONS FOR ALL PATIENTS WITH MEMORY PROBLEMS: 1. Continue to exercise (Recommend 30 minutes of walking everyday, or 3 hours every week) 2. Increase social interactions - continue going to [place] and enjoy social gatherings with friends and family 3. Eat healthy, avoid fried foods and eat more fruits and vegetables 4. Maintain adequate blood pressure, blood sugar, and blood cholesterol level. Reducing the risk of  stroke and cardiovascular disease also helps promoting better memory. 5. Avoid stressful situations. Live a simple life and avoid aggravations. Organize your time and prepare for the next day in anticipation. 6. Sleep well, avoid any interruptions of sleep and avoid any distractions in the bedroom that may interfere with adequate sleep quality 7. Avoid sugar, avoid sweets as there is a strong link between excessive sugar intake, diabetes, and cognitive impairment The Mediterranean diet has been shown to help patients reduce the risk of progressive  memory disorders and reduces cardiovascular risk. This includes eating fish, eat fruits and green leafy vegetables, nuts like almonds and hazelnuts, walnuts, and also use olive oil. Avoid fast foods and fried foods as much as possible. Avoid sweets and sugar as sugar use has been linked to worsening of memory function.  There is always a concern of gradual progression of memory problems. If this is the case, then we may need to adjust level of care according to patient needs. Support, both to the patient and caregiver, should then be put into place.

## 2021-07-01 ENCOUNTER — Other Ambulatory Visit: Payer: Self-pay

## 2021-07-01 ENCOUNTER — Ambulatory Visit: Payer: Medicare Other

## 2021-07-05 ENCOUNTER — Telehealth: Payer: Self-pay

## 2021-07-05 NOTE — Telephone Encounter (Signed)
Order for hospital bed was placed a month ago- can we follow up on this?  Algis Greenhouse. [name], MD 07/05/2021 1:06 PM

## 2021-07-05 NOTE — Telephone Encounter (Signed)
Pt's daughter called regarding getting a hospital bed for [facility]. She stated that another provider recommended for [name] to get a hospital bed at home. [name] then stated that she talked about it to [facility] during an appt. [name] stated tha [name] would look into it but never heard back. Please Advise.

## 2021-07-06 NOTE — Telephone Encounter (Signed)
Can you follow up on this please [name].

## 2021-07-06 NOTE — Telephone Encounter (Signed)
Daughter, [name] has called in this morning to check on status.    Please follow back up with her at [phone].

## 2021-07-08 NOTE — Telephone Encounter (Signed)
DME order re faxed today #1 [phone] Patient Daughter notified

## 2021-07-08 NOTE — Telephone Encounter (Signed)
Stella please follow up on this.

## 2021-07-13 ENCOUNTER — Telehealth (INDEPENDENT_AMBULATORY_CARE_PROVIDER_SITE_OTHER): Payer: Medicare Other | Admitting: Psychiatry

## 2021-07-13 ENCOUNTER — Other Ambulatory Visit: Payer: Self-pay

## 2021-07-13 ENCOUNTER — Telehealth: Payer: Self-pay | Admitting: *Deleted

## 2021-07-13 ENCOUNTER — Encounter (HOSPITAL_COMMUNITY): Payer: Self-pay | Admitting: Psychiatry

## 2021-07-13 VITALS — Wt 236.0 lb

## 2021-07-13 DIAGNOSIS — F09 Unspecified mental disorder due to known physiological condition: Secondary | ICD-10-CM | POA: Diagnosis not present

## 2021-07-13 DIAGNOSIS — F200 Paranoid schizophrenia: Secondary | ICD-10-CM | POA: Diagnosis not present

## 2021-07-13 DIAGNOSIS — F419 Anxiety disorder, unspecified: Secondary | ICD-10-CM

## 2021-07-13 MED ORDER — BUPROPION HCL ER (XL) 300 MG PO TB24: 300.0000 mg | ORAL_TABLET | Freq: Every morning | ORAL | 0 refills | Status: DC

## 2021-07-13 MED ORDER — OLANZAPINE 2.5 MG PO TABS: ORAL_TABLET | ORAL | 0 refills | Status: DC

## 2021-07-13 NOTE — Telephone Encounter (Signed)
Patient daughter stated [employer] did not received fax Note re-faxed again to [phone] att [place]  Patient daughter aware
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
## 2021-07-20 DIAGNOSIS — I509 Heart failure, unspecified: Secondary | ICD-10-CM | POA: Diagnosis not present

## 2021-07-20 DIAGNOSIS — I10 Essential (primary) hypertension: Secondary | ICD-10-CM | POA: Diagnosis not present

## 2021-07-28 ENCOUNTER — Encounter: Payer: Self-pay | Admitting: Family Medicine

## 2021-08-01 ENCOUNTER — Encounter: Payer: Self-pay | Admitting: Family Medicine

## 2021-08-02 ENCOUNTER — Other Ambulatory Visit: Payer: Self-pay

## 2021-08-09 ENCOUNTER — Telehealth: Payer: Self-pay | Admitting: Pharmacist

## 2021-08-09 ENCOUNTER — Other Ambulatory Visit: Payer: Self-pay | Admitting: *Deleted

## 2021-08-09 NOTE — Chronic Care Management (AMB) (Signed)
Chronic Care Management Pharmacy Assistant   Name: [name]  MRN: [id number] DOB: [birth year]   Reason for Encounter: Hypertension Adherence Call    Recent office visits:  None  Recent consult visits:  07/13/2021 VV (Behavioral Health) Kathlee Nations, MD; no further information available.  06/29/2021 OV (Neurology) Cameron Sprang, MD; no medication changes indicated.  Hospital visits:  None in previous 6 months  Medications: Outpatient Encounter Medications as of 08/09/2021  Medication Sig   acetaminophen (TYLENOL) 500 MG tablet Take 500 mg by mouth at bedtime.    albuterol (VENTOLIN HFA) 108 (90 Base) MCG/ACT inhaler Inhale 2 puffs into the lungs every 6 (six) hours as needed for wheezing or shortness of breath.   allopurinol (ZYLOPRIM) 300 MG tablet TAKE 1 TABLET BY MOUTH  DAILY   amLODipine (NORVASC) 5 MG tablet Take 1 tablet (5 mg total) by mouth daily.   ascorbic acid (VITAMIN C) 500 MG tablet Take 500 mg by mouth daily.  (Patient not taking: Reported on 06/29/2021)   atorvastatin (LIPITOR) 40 MG tablet TAKE 1 TABLET BY MOUTH  DAILY   azelastine (ASTELIN) 0.1 % nasal spray Place 2 sprays into both nostrils 2 (two) times daily.   budesonide (PULMICORT) 0.25 MG/2ML nebulizer solution Take 2 mLs (0.25 mg total) by nebulization 2 (two) times daily.   buPROPion (WELLBUTRIN XL) 300 MG 24 hr tablet Take 1 tablet (300 mg total) by mouth every morning.   carvedilol (COREG) 3.125 MG tablet Take 1 tablet (3.125 mg total) by mouth 2 (two) times daily with a meal.   Cyanocobalamin (VITAMIN B12) 1000 MCG TBCR Take 1 tablet by mouth daily.   diclofenac Sodium (VOLTAREN) 1 % GEL Apply 4 g topically 4 (four) times daily as needed (for pain).   donepezil (ARICEPT) 5 MG tablet Take 1 tablet (5 mg total) by mouth at bedtime.   fluticasone (FLONASE) 50 MCG/ACT nasal spray Place into both nostrils as needed for allergies or rhinitis.   furosemide (LASIX) 80 MG tablet Take 80 mg by  mouth daily.   ipratropium-albuterol (DUONEB) 0.5-2.5 (3) MG/3ML SOLN Take 3 mLs by nebulization 2 (two) times daily.   ketoconazole (NIZORAL) 2 % cream Apply 1 application topically 2 (two) times daily.   lactobacillus (FLORANEX/LACTINEX) PACK Take 1 packet (1 g total) by mouth 3 (three) times daily with meals. (Patient not taking: Reported on 06/29/2021)   lamoTRIgine (LAMICTAL) 25 MG tablet Take 2 tablets twice a day   losartan (COZAAR) 100 MG tablet TAKE 1 TABLET BY MOUTH  DAILY   nystatin (MYCOSTATIN/NYSTOP) powder Apply 1 application topically 3 (three) times daily.   OLANZapine (ZYPREXA) 2.5 MG tablet Take 1 tab at bedtime   pantoprazole (PROTONIX) 40 MG tablet TAKE 1 TABLET BY MOUTH  TWICE DAILY BEFORE A MEAL (Patient taking differently: Take 40 mg by mouth 2 (two) times daily before a meal. TAKE 1 TABLET BY MOUTH  TWICE DAILY BEFORE A MEAL)   No facility-administered encounter medications on file as of 08/09/2021.   Reviewed chart prior to disease state call. Spoke with patient regarding BP  Recent Office Vitals: BP Readings from Last 3 Encounters:  06/29/21 (!) 149/66  04/26/21 122/82  04/01/21 (!) 151/68   Pulse Readings from Last 3 Encounters:  06/29/21 74  04/26/21 69  04/01/21 76    Wt Readings from Last 3 Encounters:  04/01/21 236 lb (107 kg)  03/28/21 270 lb 11.6 oz (122.8 kg)  03/25/21 265 lb 6.4 oz (  120.4 kg)     Kidney Function Lab Results  Component Value Date/Time   CREATININE 1.2 (A) 06/15/2021 12:00 AM   CREATININE 1.64 (H) 03/29/2021 06:32 AM   CREATININE 1.42 (H) 03/28/2021 07:08 AM   CREATININE 1.43 (H) 09/22/2020 03:28 PM   CREATININE 1.39 (H) 09/13/2020 03:39 PM   GFR 30.94 (L) 02/28/2021 03:03 PM   GFRNONAA 32 (L) 03/29/2021 06:32 AM   GFRNONAA 40 (L) 05/19/2014 11:36 AM   GFRAA 47 06/15/2021 12:00 AM   GFRAA 46 (L) 05/19/2014 11:36 AM    BMP Latest Ref Rng & Units 06/15/2021 03/29/2021 03/28/2021  Glucose 70 - 99 mg/dL - 121(H) 130(H)  BUN 4 -  21 10 21 21   Creatinine 0.5 - 1.1 1.2(A) 1.64(H) 1.42(H)  BUN/Creat Ratio 12 - 28 - - -  Sodium 137 - 147 144 142 143  Potassium 3.4 - 5.3 3.8 3.7 3.8  Chloride 99 - 108 108 105 107  CO2 13 - 22 26(A) 27 26  Calcium 8.7 - 10.7 9.9 9.3 9.3    Current antihypertensive regimen:  Losartan 100 mg daily Carvedilol 3.125 mg twice daily Amlodipine 5 mg daily Furosemide 80 mg daily  How often are you checking your Blood Pressure? infrequently  Current home BP readings: 121/77  What recent interventions/DTPs have been made by any provider to improve Blood Pressure control since last [name] Visit: No recent interventions or DTPs.  Any recent hospitalizations or ED visits since last visit with [name]? No  What diet changes have been made to improve Blood Pressure Control?  Patient is not eating, giving her ensure, losing weight, swelling in her legs are going down. Not eating much at all. At least 2 a day, may eat a small meal throughout the day.  What exercise is being done to improve your Blood Pressure Control?  Patient is unable to exercise at this time.  Adherence Review: Is the patient currently on ACE/ARB medication? Yes Does the patient have >5 day gap between last estimated fill dates? No  Care Gaps: Medicare Annual Wellness: scheduled 09/01/2021 Hemoglobin A1C: 6.7% on 03/27/2021 Colonoscopy: Aged Out Dexa Scan: Completed Mammogram: Overdue since 10/05/2020  Future Appointments  Date Time Provider [place]  09/01/2021  1:45 PM [facility]  09/01/2021  2:40 PM Vivi Barrack, MD [facility]  10/11/2021  3:20 PM Arfeen, Arlyce Harman, MD [facility] None  11/02/2021  9:00 AM Freada Bergeron, MD [facility]  12/30/2021  4:00 PM Cameron Sprang, MD [facility] None    Star Rating Drugs: Atorvastatin 40 mg last filled 06/25/2021 90 DS Losartan 100 mg last filled 06/25/2021 90 DS  April D Calhoun, [place] Pharmacist Assistant [phone]

## 2021-08-15 ENCOUNTER — Other Ambulatory Visit: Payer: Self-pay

## 2021-08-15 DIAGNOSIS — L89309 Pressure ulcer of unspecified buttock, unspecified stage: Secondary | ICD-10-CM

## 2021-08-15 DIAGNOSIS — F0392 Unspecified dementia, unspecified severity, with psychotic disturbance: Secondary | ICD-10-CM

## 2021-08-15 DIAGNOSIS — R600 Localized edema: Secondary | ICD-10-CM

## 2021-08-19 ENCOUNTER — Other Ambulatory Visit: Payer: Self-pay | Admitting: Family Medicine

## 2021-08-19 DIAGNOSIS — I509 Heart failure, unspecified: Secondary | ICD-10-CM | POA: Diagnosis not present

## 2021-08-19 DIAGNOSIS — I10 Essential (primary) hypertension: Secondary | ICD-10-CM | POA: Diagnosis not present

## 2021-09-01 ENCOUNTER — Telehealth: Payer: Self-pay

## 2021-09-01 ENCOUNTER — Ambulatory Visit: Payer: Medicare Other | Admitting: Family Medicine

## 2021-09-01 NOTE — Telephone Encounter (Signed)
PCP aware

## 2021-09-01 NOTE — Progress Notes (Incomplete)
   WILLINE SCHWALBE is a 80 y.o. female who presents today for an office visit.  Assessment/Plan:  New/Acute Problems: ***  Chronic Problems Addressed Today: No problem-specific Assessment & Plan notes found for this encounter.     Subjective:  HPI:  She is here to follow up.    She is currently on Coreg 3.125 mg twice daily, amlodipine 5 mg daily and losartan 100 mg daily for hypertension       Objective:  Physical Exam: There were no vitals taken for this visit.  Gen: No acute distress, resting comfortably*** CV: Regular rate and rhythm with no murmurs appreciated Pulm: Normal work of breathing, clear to auscultation bilaterally with no crackles, wheezes, or rhonchi Neuro: Grossly normal, moves all extremities Psych: Normal affect and thought content       I,Savera Zaman,acting as a scribe for Dimas Chyle, MD.,have documented all relevant documentation on the behalf of Dimas Chyle, MD,as directed by  Dimas Chyle, MD while in the presence of Dimas Chyle, MD.   *** Algis Greenhouse. [name], MD 09/01/2021 8:06 AM

## 2021-09-01 NOTE — Telephone Encounter (Signed)
Patient's daughter came into the office today and advised that Sherald did not come to the appointment today was a bad day with dementia.

## 2021-09-05 ENCOUNTER — Telehealth (INDEPENDENT_AMBULATORY_CARE_PROVIDER_SITE_OTHER): Payer: Medicare Other | Admitting: Family Medicine

## 2021-09-05 VITALS — Ht 63.0 in | Wt 236.0 lb

## 2021-09-05 DIAGNOSIS — I5032 Chronic diastolic (congestive) heart failure: Secondary | ICD-10-CM

## 2021-09-05 DIAGNOSIS — R5381 Other malaise: Secondary | ICD-10-CM | POA: Diagnosis not present

## 2021-09-05 DIAGNOSIS — I878 Other specified disorders of veins: Secondary | ICD-10-CM | POA: Diagnosis not present

## 2021-09-05 NOTE — Assessment & Plan Note (Signed)
Doing much better with hospital bed.  She will continue her current regimen per cardiology including losartan 100 mg daily, furosemide 80 mg daily, Coreg 3.125 mg twice daily.

## 2021-09-05 NOTE — Telephone Encounter (Signed)
Pt's daughter was returning a call. Please Advise.

## 2021-09-05 NOTE — Assessment & Plan Note (Signed)
Will place referral to home health PT for strengthening exercises

## 2021-09-05 NOTE — Progress Notes (Signed)
   [name] is a 80 y.o. female who presents today for a virtual office visit.  Assessment/Plan:  Chronic Problems Addressed Today: Chronic diastolic heart failure ([place]) Doing much better with hospital bed.  She will continue her current regimen per cardiology including losartan 100 mg daily, furosemide 80 mg daily, Coreg 3.125 mg twice daily.  Debility Will place referral to home health PT for strengthening exercises  Venous stasis of lower extremity Doing much better with hospital bed and frequent elevation.     Subjective:  HPI:  See A/P for status of chronic conditions.  She is doing well.  Recently received hospital bed due to her history of severe lower extremity edema, debility, and need for frequent repositioning.  She has done well with hospital bed in terms of managing her lower extremity edema however she would like some further assistance with strengthening and other exercises to help assist with transition to and from the bed.       Objective/Observations  Physical Exam: Gen: NAD, resting comfortably Pulm: Normal work of breathing Neuro: Grossly normal, moves all extremities Psych: Normal affect and thought content  Virtual Visit via Video   I connected with [name] on 09/05/21 at  4:00 PM EST by a video enabled telemedicine application and verified that I am speaking with the correct person using two identifiers. The limitations of evaluation and management by telemedicine and the availability of in person appointments were discussed. The patient expressed understanding and agreed to proceed.   Patient location: Home Provider location: [place] participating in the virtual visit: Myself and Patient     Algis Greenhouse. [name], MD 09/05/2021 3:31 PM

## 2021-09-05 NOTE — Telephone Encounter (Signed)
Spoke with daughter, no information needed

## 2021-09-05 NOTE — Assessment & Plan Note (Addendum)
Doing much better with hospital bed and frequent elevation.

## 2021-09-14 ENCOUNTER — Telehealth: Payer: Self-pay

## 2021-09-14 NOTE — Telephone Encounter (Signed)
Pt's daughter called wanting an update on referral for Home Health PT. She would like a call back to discuss. Please Advise.

## 2021-09-16 NOTE — Telephone Encounter (Signed)
Verbal left on voicemail

## 2021-09-16 NOTE — Telephone Encounter (Signed)
[name] from [facility] called needing a new verbal to start PT next week. She can be reached at [phone]. Please Advise.

## 2021-09-19 ENCOUNTER — Telehealth: Payer: Self-pay | Admitting: *Deleted

## 2021-09-19 DIAGNOSIS — E785 Hyperlipidemia, unspecified: Secondary | ICD-10-CM | POA: Diagnosis not present

## 2021-09-19 DIAGNOSIS — G3184 Mild cognitive impairment, so stated: Secondary | ICD-10-CM | POA: Diagnosis not present

## 2021-09-19 DIAGNOSIS — R569 Unspecified convulsions: Secondary | ICD-10-CM | POA: Diagnosis not present

## 2021-09-19 DIAGNOSIS — Z9181 History of falling: Secondary | ICD-10-CM | POA: Diagnosis not present

## 2021-09-19 DIAGNOSIS — I509 Heart failure, unspecified: Secondary | ICD-10-CM | POA: Diagnosis not present

## 2021-09-19 DIAGNOSIS — I87332 Chronic venous hypertension (idiopathic) with ulcer and inflammation of left lower extremity: Secondary | ICD-10-CM | POA: Diagnosis not present

## 2021-09-19 DIAGNOSIS — I10 Essential (primary) hypertension: Secondary | ICD-10-CM | POA: Diagnosis not present

## 2021-09-19 DIAGNOSIS — R531 Weakness: Secondary | ICD-10-CM | POA: Diagnosis not present

## 2021-09-19 DIAGNOSIS — M109 Gout, unspecified: Secondary | ICD-10-CM | POA: Diagnosis not present

## 2021-09-19 DIAGNOSIS — I5032 Chronic diastolic (congestive) heart failure: Secondary | ICD-10-CM | POA: Diagnosis not present

## 2021-09-19 NOTE — Telephone Encounter (Signed)
Ok with me. Please place any necessary orders. 

## 2021-09-19 NOTE — Telephone Encounter (Signed)
[facility] PT requesting VO  2xper week for 3 weeks 1x per week for 1 week   VO given

## 2021-09-28 ENCOUNTER — Other Ambulatory Visit: Payer: Self-pay

## 2021-09-28 ENCOUNTER — Telehealth (HOSPITAL_BASED_OUTPATIENT_CLINIC_OR_DEPARTMENT_OTHER): Payer: Medicare Other | Admitting: Psychiatry

## 2021-09-28 ENCOUNTER — Encounter (HOSPITAL_COMMUNITY): Payer: Self-pay | Admitting: Psychiatry

## 2021-09-28 DIAGNOSIS — R569 Unspecified convulsions: Secondary | ICD-10-CM | POA: Diagnosis not present

## 2021-09-28 DIAGNOSIS — F419 Anxiety disorder, unspecified: Secondary | ICD-10-CM

## 2021-09-28 DIAGNOSIS — R531 Weakness: Secondary | ICD-10-CM | POA: Diagnosis not present

## 2021-09-28 DIAGNOSIS — E785 Hyperlipidemia, unspecified: Secondary | ICD-10-CM | POA: Diagnosis not present

## 2021-09-28 DIAGNOSIS — F200 Paranoid schizophrenia: Secondary | ICD-10-CM

## 2021-09-28 DIAGNOSIS — F09 Unspecified mental disorder due to known physiological condition: Secondary | ICD-10-CM | POA: Diagnosis not present

## 2021-09-28 DIAGNOSIS — M109 Gout, unspecified: Secondary | ICD-10-CM | POA: Diagnosis not present

## 2021-09-28 DIAGNOSIS — G3184 Mild cognitive impairment, so stated: Secondary | ICD-10-CM | POA: Diagnosis not present

## 2021-09-28 DIAGNOSIS — I10 Essential (primary) hypertension: Secondary | ICD-10-CM | POA: Diagnosis not present

## 2021-09-28 DIAGNOSIS — I5032 Chronic diastolic (congestive) heart failure: Secondary | ICD-10-CM | POA: Diagnosis not present

## 2021-09-28 DIAGNOSIS — I87332 Chronic venous hypertension (idiopathic) with ulcer and inflammation of left lower extremity: Secondary | ICD-10-CM | POA: Diagnosis not present

## 2021-09-28 DIAGNOSIS — Z9181 History of falling: Secondary | ICD-10-CM | POA: Diagnosis not present

## 2021-09-28 MED ORDER — BUPROPION HCL ER (XL) 300 MG PO TB24: 300.0000 mg | ORAL_TABLET | Freq: Every morning | ORAL | 1 refills | Status: DC

## 2021-09-28 MED ORDER — OLANZAPINE 2.5 MG PO TABS: ORAL_TABLET | ORAL | 1 refills | Status: DC
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
## 2021-10-10 DIAGNOSIS — E785 Hyperlipidemia, unspecified: Secondary | ICD-10-CM

## 2021-10-10 DIAGNOSIS — Z87891 Personal history of nicotine dependence: Secondary | ICD-10-CM

## 2021-10-10 DIAGNOSIS — Z9181 History of falling: Secondary | ICD-10-CM

## 2021-10-10 DIAGNOSIS — I87332 Chronic venous hypertension (idiopathic) with ulcer and inflammation of left lower extremity: Secondary | ICD-10-CM | POA: Diagnosis not present

## 2021-10-10 DIAGNOSIS — M109 Gout, unspecified: Secondary | ICD-10-CM

## 2021-10-10 DIAGNOSIS — I5032 Chronic diastolic (congestive) heart failure: Secondary | ICD-10-CM | POA: Diagnosis not present

## 2021-10-10 DIAGNOSIS — I10 Essential (primary) hypertension: Secondary | ICD-10-CM | POA: Diagnosis not present

## 2021-10-10 DIAGNOSIS — E669 Obesity, unspecified: Secondary | ICD-10-CM

## 2021-10-10 DIAGNOSIS — R531 Weakness: Secondary | ICD-10-CM | POA: Diagnosis not present

## 2021-10-10 DIAGNOSIS — R569 Unspecified convulsions: Secondary | ICD-10-CM

## 2021-10-10 DIAGNOSIS — G3184 Mild cognitive impairment, so stated: Secondary | ICD-10-CM

## 2021-10-11 ENCOUNTER — Telehealth (HOSPITAL_COMMUNITY): Payer: Medicare Other | Admitting: Psychiatry

## 2021-10-13 ENCOUNTER — Other Ambulatory Visit: Payer: Self-pay | Admitting: *Deleted

## 2021-10-13 MED ORDER — AMLODIPINE BESYLATE 5 MG PO TABS: 5.0000 mg | ORAL_TABLET | Freq: Every day | ORAL | 3 refills | Status: DC

## 2021-11-01 DIAGNOSIS — R569 Unspecified convulsions: Secondary | ICD-10-CM | POA: Diagnosis not present

## 2021-11-01 DIAGNOSIS — I10 Essential (primary) hypertension: Secondary | ICD-10-CM | POA: Diagnosis not present

## 2021-11-01 DIAGNOSIS — G3184 Mild cognitive impairment, so stated: Secondary | ICD-10-CM | POA: Diagnosis not present

## 2021-11-01 DIAGNOSIS — Z9181 History of falling: Secondary | ICD-10-CM | POA: Diagnosis not present

## 2021-11-01 DIAGNOSIS — I87332 Chronic venous hypertension (idiopathic) with ulcer and inflammation of left lower extremity: Secondary | ICD-10-CM | POA: Diagnosis not present

## 2021-11-01 DIAGNOSIS — M109 Gout, unspecified: Secondary | ICD-10-CM | POA: Diagnosis not present

## 2021-11-01 DIAGNOSIS — R531 Weakness: Secondary | ICD-10-CM | POA: Diagnosis not present

## 2021-11-01 DIAGNOSIS — I5032 Chronic diastolic (congestive) heart failure: Secondary | ICD-10-CM | POA: Diagnosis not present

## 2021-11-01 DIAGNOSIS — E785 Hyperlipidemia, unspecified: Secondary | ICD-10-CM | POA: Diagnosis not present

## 2021-11-02 ENCOUNTER — Ambulatory Visit: Payer: Medicare Other | Admitting: Cardiology

## 2021-11-02 ENCOUNTER — Telehealth: Payer: Self-pay | Admitting: Family Medicine

## 2021-11-02 NOTE — Telephone Encounter (Signed)
Pt daughter called and said that pt physical therapist came out and said that they think the patient needs to be checked for an UTI, pts daughter asked for a call back from a nurse so she could ask questions. Callback number [phone]

## 2021-11-03 ENCOUNTER — Telehealth: Payer: Self-pay

## 2021-11-03 ENCOUNTER — Encounter: Payer: Self-pay | Admitting: Neurology

## 2021-11-03 ENCOUNTER — Encounter: Payer: Self-pay | Admitting: Family Medicine

## 2021-11-03 NOTE — Telephone Encounter (Signed)
See note

## 2021-11-03 NOTE — Telephone Encounter (Signed)
Patient is scheduled for 3pm per Junious Dresser.

## 2021-11-03 NOTE — Telephone Encounter (Signed)
Spoke with patient daughter aware need to be seen for evaluation  Stated tomorrow be ok to be seen. Please schedule appointment

## 2021-11-03 NOTE — Telephone Encounter (Signed)
Patient has OV with PCP  

## 2021-11-03 NOTE — Telephone Encounter (Signed)
Spoke with pt daughter they have an appointment with PCP tomorrow 11/04/21 for sudden changes

## 2021-11-04 ENCOUNTER — Other Ambulatory Visit: Payer: Self-pay

## 2021-11-04 ENCOUNTER — Ambulatory Visit (INDEPENDENT_AMBULATORY_CARE_PROVIDER_SITE_OTHER): Payer: Commercial Managed Care - HMO | Admitting: Family Medicine

## 2021-11-04 ENCOUNTER — Encounter: Payer: Self-pay | Admitting: Family Medicine

## 2021-11-04 VITALS — BP 146/84 | HR 70 | Temp 98.0°F | Ht 63.0 in

## 2021-11-04 DIAGNOSIS — R269 Unspecified abnormalities of gait and mobility: Secondary | ICD-10-CM

## 2021-11-04 DIAGNOSIS — M1990 Unspecified osteoarthritis, unspecified site: Secondary | ICD-10-CM | POA: Diagnosis not present

## 2021-11-04 DIAGNOSIS — M109 Gout, unspecified: Secondary | ICD-10-CM | POA: Diagnosis not present

## 2021-11-04 DIAGNOSIS — I10 Essential (primary) hypertension: Secondary | ICD-10-CM | POA: Diagnosis not present

## 2021-11-04 DIAGNOSIS — N1832 Chronic kidney disease, stage 3b: Secondary | ICD-10-CM

## 2021-11-04 NOTE — Assessment & Plan Note (Signed)
Check labs 

## 2021-11-04 NOTE — Progress Notes (Signed)
[name] is a 81 y.o. female who presents today for an office visit.  Assessment/Plan:  New/Acute Problems: Abnormal Gait Unclear etiology.  Limited history and physical exam due to patient's dementia.  She has good muscle tone in bilateral lower extremities and is able to move spontaneously and resist gravity-do not think this is an acute neurologic issue.  It is possible that she could have a flare of arthritic pain.  We will check labs and urine to rule out any possible metabolic causes.  We will also check plain films including bilateral knees, bilateral hip, and low back to further assess.  Recommended they take Tylenol 1000 mg 3 times daily as needed.  Management for pain is somewhat limited at this point due to her history of CKD limiting NSAID use, seizure disorder limiting tramadol use and schizoaffective disorder limiting use of prednisone.  If symptoms are not improving in the next several days would consider advanced imaging versus having her follow back up with neurology.  It is possible this could be progression of her underlying dementia.   Chronic Problems Addressed Today: Chronic kidney disease (CKD), stage III (moderate) ([place]) Check labs.  Osteoarthritis She is working with physical therapy.  As above her options for pain control limited.  She can continue using over-the-counter Voltaren gel.  Also continue Tylenol.  Essential hypertension At goal per JNC 8 on Coreg 3.125 mg twice daily, amlodipine 5 mg daily, and losartan 100 mg daily.  Gout No signs of gout flare.  She is on allopurinol 300 mg daily.     Subjective:  HPI:  Patient here with her daughter today.   History is very limited due to patient's dementia. For the last week or so she has had lower extremity weakness with difficulty walking and standing. This seems to be a sudden change starting about a week ago.  He was not willing to walk or stand for a few days however a couple days ago he started  walking on the lateral again without issue.  Patient's daughter is concerned that she may be having the same pain in her knees.  Over the last several days she has had more localizing symptoms to her left knee.  Patient states that it hurts.  She seems to be guarding that area.  She is working with home health physical therapy.  She would like to rule out UTI today.  No changes in bowel or bladder habits.        Objective:  Physical Exam: BP (!) 146/84 (BP Location: Left Arm, Patient Position: Sitting, Cuff Size: Large)    Pulse 70    Temp 98 F (36.7 C) (Temporal)    Ht 5\' 3"  (1.6 m)    SpO2 96%    BMI 41.81 kg/m   Gen: No acute distress, resting comfortably, not fully cooperative with exam MSK: In wheelchair.  Lower extremities with mild pitting edema.  Able to hold bilateral lower extremities against gravity.  Limited left knee exam without obvious abnormality.  Exam limited due to patient's mental status and body habitus.  Gait observation deferred due to safety concerns Neuro: Cranial nerves II through XII grossly intact.  Moves all extremities spontaneously.  Sensation to light touch grossly intact.  Time Spent: 45 minutes of total time was spent on the date of the encounter performing the following actions: chart review prior to seeing the patient including previous imaging results, obtaining history, performing a medically necessary exam, counseling on the treatment plan,  placing orders, and documenting in our EHR.        Algis Greenhouse. [name], MD 11/04/2021 4:00 PM

## 2021-11-04 NOTE — Assessment & Plan Note (Signed)
At goal per JNC 8 on Coreg 3.125 mg twice daily, amlodipine 5 mg daily, and losartan 100 mg daily.

## 2021-11-04 NOTE — Patient Instructions (Signed)
It was very nice to see you today!  It is possible that she could be having a flareup of her arthritis.  It is okay to take up to 3000 mg of Tylenol daily.  We will check blood work, urine sample, and x-ray to make sure there is nothing else is going on.  Take care, Dr [name]  PLEASE NOTE:  If you had any lab tests please let us know if you have not heard back within a few days. You may see your results on mychart before we have a chance to review them but we will give you a call once they are reviewed by [place]. If we ordered any referrals today, please let us know if you have not heard from their office within the next week.   Please try these tips to maintain a healthy lifestyle:  Eat at least 3 REAL meals and 1-2 snacks per day.  Aim for no more than 5 hours between eating.  If you eat breakfast, please do so within one hour of getting up.   Each meal should contain half fruits/vegetables, one quarter protein, and one quarter carbs (no bigger than a computer mouse)  Cut down on sweet beverages. This includes juice, soda, and sweet tea.   Drink at least 1 glass of water with each meal and aim for at least 8 glasses per day  Exercise at least 150 minutes every week.

## 2021-11-04 NOTE — Assessment & Plan Note (Signed)
She is working with physical therapy.  As above her options for pain control limited.  She can continue using over-the-counter Voltaren gel.  Also continue Tylenol.

## 2021-11-04 NOTE — Assessment & Plan Note (Signed)
No signs of gout flare.  She is on allopurinol 300 mg daily.

## 2021-11-05 LAB — URINALYSIS, ROUTINE W REFLEX MICROSCOPIC
Bilirubin Urine: NEGATIVE
Glucose, UA: NEGATIVE
Hgb urine dipstick: NEGATIVE
Hyaline Cast: NONE SEEN /LPF
Ketones, ur: NEGATIVE
Leukocytes,Ua: NEGATIVE
Nitrite: NEGATIVE
RBC / HPF: NONE SEEN /HPF (ref 0–2)
Specific Gravity, Urine: 1.019 (ref 1.001–1.035)
WBC, UA: NONE SEEN /HPF (ref 0–5)
pH: 6.5 (ref 5.0–8.0)

## 2021-11-05 LAB — URINE CULTURE
MICRO NUMBER:: 12837830
SPECIMEN QUALITY:: ADEQUATE

## 2021-11-05 LAB — COMPREHENSIVE METABOLIC PANEL
AG Ratio: 1.5 (calc) (ref 1.0–2.5)
ALT: 14 U/L (ref 6–29)
AST: 14 U/L (ref 10–35)
Albumin: 4.1 g/dL (ref 3.6–5.1)
Alkaline phosphatase (APISO): 134 U/L (ref 37–153)
BUN/Creatinine Ratio: 9 (calc) (ref 6–22)
BUN: 12 mg/dL (ref 7–25)
CO2: 22 mmol/L (ref 20–32)
Calcium: 9.8 mg/dL (ref 8.6–10.4)
Chloride: 109 mmol/L (ref 98–110)
Creat: 1.28 mg/dL — ABNORMAL HIGH (ref 0.60–0.95)
Globulin: 2.7 g/dL (calc) (ref 1.9–3.7)
Glucose, Bld: 98 mg/dL (ref 65–99)
Potassium: 4.1 mmol/L (ref 3.5–5.3)
Sodium: 146 mmol/L (ref 135–146)
Total Bilirubin: 0.5 mg/dL (ref 0.2–1.2)
Total Protein: 6.8 g/dL (ref 6.1–8.1)

## 2021-11-05 LAB — CBC
HCT: 42.4 % (ref 35.0–45.0)
Hemoglobin: 14.4 g/dL (ref 11.7–15.5)
MCH: 32.2 pg (ref 27.0–33.0)
MCHC: 34 g/dL (ref 32.0–36.0)
MCV: 94.9 fL (ref 80.0–100.0)
MPV: 13.9 fL — ABNORMAL HIGH (ref 7.5–12.5)
Platelets: 197 10*3/uL (ref 140–400)
RBC: 4.47 10*6/uL (ref 3.80–5.10)
RDW: 14.2 % (ref 11.0–15.0)
WBC: 6.9 10*3/uL (ref 3.8–10.8)

## 2021-11-05 LAB — MICROSCOPIC MESSAGE

## 2021-11-05 LAB — URIC ACID: Uric Acid, Serum: 3 mg/dL (ref 2.5–7.0)

## 2021-11-05 LAB — EXTRA URINE SPECIMEN

## 2021-11-05 LAB — TSH: TSH: 3.4 mIU/L (ref 0.40–4.50)

## 2021-11-05 LAB — VITAMIN B12: Vitamin B-12: >2000 pg/mL — ABNORMAL HIGH (ref 200–1100)

## 2021-11-07 NOTE — Progress Notes (Signed)
Please inform patient of the following:  Urine culture is negative.  She does not have a UTI.  Her B12 is very high but everything else is normal.  Can we have her stop her B12 supplement and come back in 3 months to recheck B12?  We will contact with results of x-rays as we get them back.

## 2021-11-14 ENCOUNTER — Ambulatory Visit (INDEPENDENT_AMBULATORY_CARE_PROVIDER_SITE_OTHER)
Admission: RE | Admit: 2021-11-14 | Discharge: 2021-11-14 | Disposition: A | Discharge status: Home / Self Care | Payer: Commercial Managed Care - HMO | Source: Ambulatory Visit | Attending: Family Medicine | Admitting: Family Medicine

## 2021-11-14 ENCOUNTER — Other Ambulatory Visit: Payer: Self-pay

## 2021-11-14 ENCOUNTER — Other Ambulatory Visit: Payer: Self-pay | Admitting: Family Medicine

## 2021-11-14 DIAGNOSIS — M1712 Unilateral primary osteoarthritis, left knee: Secondary | ICD-10-CM | POA: Diagnosis not present

## 2021-11-14 DIAGNOSIS — M25551 Pain in right hip: Secondary | ICD-10-CM | POA: Diagnosis not present

## 2021-11-14 DIAGNOSIS — R269 Unspecified abnormalities of gait and mobility: Secondary | ICD-10-CM

## 2021-11-14 DIAGNOSIS — M25552 Pain in left hip: Secondary | ICD-10-CM | POA: Diagnosis not present

## 2021-11-14 DIAGNOSIS — R69 Illness, unspecified: Secondary | ICD-10-CM | POA: Diagnosis not present

## 2021-11-14 DIAGNOSIS — M1711 Unilateral primary osteoarthritis, right knee: Secondary | ICD-10-CM | POA: Diagnosis not present

## 2021-11-14 DIAGNOSIS — M5136 Other intervertebral disc degeneration, lumbar region: Secondary | ICD-10-CM | POA: Diagnosis not present

## 2021-11-14 NOTE — Progress Notes (Signed)
Please inform patient of the following:  Her xrays confirm she has arthritis in her knees, hip, and back. Do not need to make any other changes to her treatment plan at this time. We can try physical therapy or sports medicine referral if the family thinks the patient would be willing to try again.  Algis Greenhouse. [name], MD 11/14/2021 2:59 PM

## 2021-11-15 DIAGNOSIS — I10 Essential (primary) hypertension: Secondary | ICD-10-CM | POA: Diagnosis not present

## 2021-11-15 DIAGNOSIS — G3184 Mild cognitive impairment, so stated: Secondary | ICD-10-CM | POA: Diagnosis not present

## 2021-11-15 DIAGNOSIS — I5032 Chronic diastolic (congestive) heart failure: Secondary | ICD-10-CM | POA: Diagnosis not present

## 2021-11-15 DIAGNOSIS — M109 Gout, unspecified: Secondary | ICD-10-CM | POA: Diagnosis not present

## 2021-11-15 DIAGNOSIS — E785 Hyperlipidemia, unspecified: Secondary | ICD-10-CM | POA: Diagnosis not present

## 2021-11-15 DIAGNOSIS — R531 Weakness: Secondary | ICD-10-CM | POA: Diagnosis not present

## 2021-11-15 DIAGNOSIS — Z9181 History of falling: Secondary | ICD-10-CM | POA: Diagnosis not present

## 2021-11-15 DIAGNOSIS — I87332 Chronic venous hypertension (idiopathic) with ulcer and inflammation of left lower extremity: Secondary | ICD-10-CM | POA: Diagnosis not present

## 2021-11-15 DIAGNOSIS — R569 Unspecified convulsions: Secondary | ICD-10-CM | POA: Diagnosis not present

## 2021-11-15 NOTE — Progress Notes (Signed)
If they are worried about stroke then an MRI would be the best test. Ok to place order for MR brain without contrast if they wish if her symptoms are not improving.   Algis Greenhouse. [name], MD 11/15/2021 7:42 AM

## 2021-11-19 DIAGNOSIS — I10 Essential (primary) hypertension: Secondary | ICD-10-CM | POA: Diagnosis not present

## 2021-11-19 DIAGNOSIS — I509 Heart failure, unspecified: Secondary | ICD-10-CM | POA: Diagnosis not present

## 2021-12-07 ENCOUNTER — Encounter: Payer: Self-pay | Admitting: Family Medicine

## 2021-12-08 ENCOUNTER — Other Ambulatory Visit: Payer: Self-pay | Admitting: *Deleted

## 2021-12-08 MED ORDER — FLUCONAZOLE 150 MG PO TABS: 150.0000 mg | ORAL_TABLET | Freq: Once | ORAL | 0 refills | Status: AC

## 2021-12-08 NOTE — Telephone Encounter (Signed)
Ok to send in diflucan 150mg  take once.  Algis Greenhouse. [name], MD 12/08/2021 10:01 AM

## 2021-12-08 NOTE — Telephone Encounter (Signed)
Rx send to pharmacy  Patient daughter aware

## 2021-12-08 NOTE — Telephone Encounter (Signed)
Spoke with Patient daughter stated patient has Vaginal itching, discharge (color white-yellow cottage cheese looking like) Requesting Rx refill

## 2021-12-14 ENCOUNTER — Ambulatory Visit: Payer: Medicare Other | Admitting: Cardiology

## 2021-12-20 ENCOUNTER — Telehealth: Payer: Self-pay | Admitting: Pharmacist

## 2021-12-20 DIAGNOSIS — I509 Heart failure, unspecified: Secondary | ICD-10-CM | POA: Diagnosis not present

## 2021-12-20 DIAGNOSIS — I10 Essential (primary) hypertension: Secondary | ICD-10-CM | POA: Diagnosis not present

## 2021-12-20 NOTE — Progress Notes (Signed)
Chronic Care Management Pharmacy Assistant   Name: [name]  MRN: [id number] DOB: 12/15/1940   Reason for Encounter: General Adherence Call    Recent office visits:  11/04/2021 OV (PCP) Vivi Barrack, MD; Her B12 is very high but everything else is normal.  Can we have her stop her B12 supplement   09/28/2021 VV (BH) Arfeen, Arlyce Harman, MD; no further information available.  09/05/2021 VV (PCP) Vivi Barrack, MD; no medication changes indicated.  Recent consult visits:  None  Hospital visits:  None in previous 6 months  Medications: Outpatient Encounter Medications as of 12/20/2021  Medication Sig   acetaminophen (TYLENOL) 500 MG tablet Take 500 mg by mouth at bedtime.    albuterol (VENTOLIN HFA) 108 (90 Base) MCG/ACT inhaler Inhale 2 puffs into the lungs every 6 (six) hours as needed for wheezing or shortness of breath.   allopurinol (ZYLOPRIM) 300 MG tablet TAKE 1 TABLET BY MOUTH  DAILY   amLODipine (NORVASC) 5 MG tablet Take 1 tablet (5 mg total) by mouth daily.   ascorbic acid (VITAMIN C) 500 MG tablet Take 500 mg by mouth daily.   atorvastatin (LIPITOR) 40 MG tablet TAKE 1 TABLET BY MOUTH  DAILY   azelastine (ASTELIN) 0.1 % nasal spray Place 2 sprays into both nostrils 2 (two) times daily.   budesonide (PULMICORT) 0.25 MG/2ML nebulizer solution Take 2 mLs (0.25 mg total) by nebulization 2 (two) times daily.   buPROPion (WELLBUTRIN XL) 300 MG 24 hr tablet Take 1 tablet (300 mg total) by mouth every morning.   carvedilol (COREG) 3.125 MG tablet Take 1 tablet (3.125 mg total) by mouth 2 (two) times daily with a meal.   Cyanocobalamin (VITAMIN B12) 1000 MCG TBCR Take 1 tablet by mouth daily.   diclofenac Sodium (VOLTAREN) 1 % GEL Apply 4 g topically 4 (four) times daily as needed (for pain).   donepezil (ARICEPT) 5 MG tablet Take 1 tablet (5 mg total) by mouth at bedtime.   fluticasone (FLONASE) 50 MCG/ACT nasal spray Place into both nostrils as needed for allergies  or rhinitis.   furosemide (LASIX) 80 MG tablet Take 80 mg by mouth daily.   ipratropium-albuterol (DUONEB) 0.5-2.5 (3) MG/3ML SOLN Take 3 mLs by nebulization 2 (two) times daily.   ketoconazole (NIZORAL) 2 % cream Apply 1 application topically 2 (two) times daily.   lactobacillus (FLORANEX/LACTINEX) PACK Take 1 packet (1 g total) by mouth 3 (three) times daily with meals.   lamoTRIgine (LAMICTAL) 25 MG tablet Take 2 tablets twice a day   losartan (COZAAR) 100 MG tablet TAKE 1 TABLET BY MOUTH  DAILY   nystatin (MYCOSTATIN/NYSTOP) powder Apply 1 application topically 3 (three) times daily.   OLANZapine (ZYPREXA) 2.5 MG tablet Take 1 tab at bedtime   pantoprazole (PROTONIX) 40 MG tablet TAKE 1 TABLET BY MOUTH  TWICE DAILY BEFORE A MEAL   No facility-administered encounter medications on file as of 12/20/2021.   Patient Questions: Have you had any problems recently with your health? Patient's daughter states the patient has not had any recent problems with her health.  Have you had any problems with your pharmacy? She denies any problems with her pharmacy.  What issues or side effects are you having with your medications? Patient's daughter states the patient isn't having any issues with any of her medications.  What would you like me to pass along to [name], [name] for him to help you with?  Patient's daughter does not  have anything for me to pass along at this time.  What can we do to take care of you better? Patient's daughter did not have any suggestions.  Care Gaps: Medicare Annual Wellness: Completed 07/01/2021 Hemoglobin A1C: 6.7% on 03/27/2021 Colonoscopy: Aged out Dexa Scan: Completed Mammogram: Overdue since 10/05/2020  Future Appointments  Date Time Provider [place]  12/30/2021  4:00 PM Cameron Sprang, MD [facility] None  02/01/2022  3:00 PM Freada Bergeron, MD [facility]  03/28/2022  3:00 PM Arfeen, Arlyce Harman, MD [facility] None   Star Rating  Drugs: Atorvastatin 40 mg last filled 09/17/2021 90 DS Losartan 100 mg last filled 09/17/2021 90 DS  April D Calhoun, [place] Pharmacist Assistant [phone]

## 2021-12-24 ENCOUNTER — Encounter: Payer: Self-pay | Admitting: Family Medicine

## 2021-12-26 ENCOUNTER — Encounter: Payer: Self-pay | Admitting: Neurology

## 2021-12-26 ENCOUNTER — Other Ambulatory Visit: Payer: Self-pay

## 2021-12-26 DIAGNOSIS — G4733 Obstructive sleep apnea (adult) (pediatric): Secondary | ICD-10-CM

## 2021-12-26 DIAGNOSIS — I5032 Chronic diastolic (congestive) heart failure: Secondary | ICD-10-CM

## 2021-12-26 DIAGNOSIS — F03911 Unspecified dementia, unspecified severity, with agitation: Secondary | ICD-10-CM

## 2021-12-26 DIAGNOSIS — I878 Other specified disorders of veins: Secondary | ICD-10-CM

## 2021-12-26 NOTE — Telephone Encounter (Signed)
Referral placed.

## 2021-12-26 NOTE — Telephone Encounter (Signed)
Please place referral to social work to help with placement.  Algis Greenhouse. [name], MD 12/26/2021 12:18 PM

## 2021-12-26 NOTE — Telephone Encounter (Signed)
Please see note.

## 2021-12-27 ENCOUNTER — Other Ambulatory Visit: Payer: Self-pay

## 2021-12-27 ENCOUNTER — Telehealth: Payer: Self-pay | Admitting: Family Medicine

## 2021-12-27 DIAGNOSIS — R300 Dysuria: Secondary | ICD-10-CM

## 2021-12-27 NOTE — Telephone Encounter (Signed)
[name] Pt daughter called to advise she is dropping off a urine sample tomorrow around lunch to test for UTI and infection. Please advise for orders?

## 2021-12-27 NOTE — Telephone Encounter (Signed)
Patients daughter would like to discuss Urine sample with MA or Dr [name]. Stollings.

## 2021-12-27 NOTE — Telephone Encounter (Signed)
Pt having itching and showing signs of discomfort. Urine is very cloudy but not sure if it is due to powders she is using . Orders placed for specimen drop off tomorrow.

## 2021-12-27 NOTE — Telephone Encounter (Signed)
I am fine with placing an order for a culture but can we get more information about what symptoms she is having?  Algis Greenhouse. [name], MD 12/27/2021 4:06 PM

## 2021-12-28 ENCOUNTER — Other Ambulatory Visit: Payer: Self-pay

## 2021-12-28 ENCOUNTER — Other Ambulatory Visit: Payer: Self-pay | Admitting: Family Medicine

## 2021-12-28 ENCOUNTER — Other Ambulatory Visit: Payer: Medicare Other

## 2021-12-28 DIAGNOSIS — R300 Dysuria: Secondary | ICD-10-CM

## 2021-12-29 ENCOUNTER — Telehealth: Payer: Self-pay | Admitting: Family Medicine

## 2021-12-29 NOTE — Telephone Encounter (Signed)
Called [name] verified pt DOB, advised lab results are not back as of yet and I will call her with results when they come back ?

## 2021-12-29 NOTE — Telephone Encounter (Signed)
Pts daughter wants a call back re urine test results. [phone]. ?

## 2021-12-30 ENCOUNTER — Telehealth: Payer: Self-pay | Admitting: Family Medicine

## 2021-12-30 ENCOUNTER — Other Ambulatory Visit: Payer: Self-pay

## 2021-12-30 ENCOUNTER — Encounter: Payer: Self-pay | Admitting: Neurology

## 2021-12-30 ENCOUNTER — Telehealth (INDEPENDENT_AMBULATORY_CARE_PROVIDER_SITE_OTHER): Payer: Medicare Other | Admitting: Neurology

## 2021-12-30 DIAGNOSIS — F03B18 Unspecified dementia, moderate, with other behavioral disturbance: Secondary | ICD-10-CM

## 2021-12-30 DIAGNOSIS — G40209 Localization-related (focal) (partial) symptomatic epilepsy and epileptic syndromes with complex partial seizures, not intractable, without status epilepticus: Secondary | ICD-10-CM | POA: Diagnosis not present

## 2021-12-30 LAB — URINE CULTURE
MICRO NUMBER:: 13074038
SPECIMEN QUALITY:: ADEQUATE

## 2021-12-30 MED ORDER — NITROFURANTOIN MONOHYD MACRO 100 MG PO CAPS: 100.0000 mg | ORAL_CAPSULE | Freq: Two times a day (BID) | ORAL | 0 refills | Status: AC

## 2021-12-30 MED ORDER — LAMOTRIGINE 25 MG PO TABS: ORAL_TABLET | ORAL | 3 refills | Status: DC

## 2021-12-30 MED ORDER — DONEPEZIL HCL 5 MG PO TABS: 5.0000 mg | ORAL_TABLET | Freq: Every day | ORAL | 3 refills | Status: DC

## 2021-12-30 NOTE — Progress Notes (Signed)
Virtual Visit via Video Note The purpose of this virtual visit is to provide medical care while limiting exposure to the novel coronavirus.    Consent was obtained for video visit:  Yes.   Answered questions that patient's daughter had about telehealth interaction:  Yes.   I discussed the limitations, risks, security and privacy concerns of performing an evaluation and management service by telemedicine. I also discussed with the patient that there may be a patient responsible charge related to this service. The patient expressed understanding and agreed to proceed.  Pt location: Home Physician Location: office Name of referring provider:  Vivi Barrack, MD I connected with [name] at patients initiation/request on 12/30/2021 at  4:00 PM EST by video enabled telemedicine application and verified that I am speaking with the correct person using two identifiers. Pt MRN:  [id number] Pt DOB:  Nov 26, 1940 Video Participants:  [name];  [name] (daughter)   History of Present Illness:  The patient had a virtual video visit on 12/30/2021. She was last seen in the neurology clinic 6 months ago for dementia and left temporal lobe epilepsy. Her daughter [name] is present to provide the history due to worsening dementia. Since her last visit, there has been continued progression of dementia. She is mostly in the bed, her son just suddenly left and [name] is her primary caregiver. [name] has difficulty with transfers due to her own medical issues, she mostly stays in bed. No significant agitation except when [name] tries to do transfers. She continues to see Dr. Adele Drake with Psychiatry. She is on Donepezil 5mg  daily. She has not had any seizures since 02/2021 on Lamotrigine 50mg  BID. [name] reports that she has had horrible looking urine recently and is awaiting results of urine culture done recently. For the past week, she has not been sleeping much and she has not been eating very much as  well. Speech today is tangential, she is unable to follow commands. [name] denies any difficulty swallowing.    History on Initial Assessment 04/01/2021: This is a 81 year old left-handed woman with a history of hypertension, hyperlipidemia, paranoid schizophrenia, presenting for evaluation of memory loss and new onset seizure. She is accompanied by her daughter [name] who helps supplement the history today. She was previously evaluated by neurologist Dr. Jannifer Drake in 2018 for memory loss that started around 2017. She felt something was different herself. MMSE 29/30 at that time. She was found to have bilateral subdural hematomas on brain MRI in 12/2016 after a fall. She had a burr hole and "went through hell and back." She got better to the point where she was doing things, putting her medications in her pillbox and taking them. [name] took over finances in 2018. In 2019, she became belligerent. She does not drive. She was started on Donepezil 5mg  daily. Memantine was started in 2019 but she had side effects of more confusion, insomnia. She was lost to follow-up but has continued to see her psychiatrist. Her daughter reports that "every January is when it happens." In January 2020, DSS called and told [name] she needed to get legal guardianship, she had been calling/threatening people and stopped her medications. Since then, [name] took over medications. That year she could still carry on a conversation. In 2021, it "all went away," she would ramble and could not hold a thought. She started needing help with bathing. Last January 2022, she started ripping her shirt, blanket out of the blue. She called Dr. Adele Drake  and Donepezil was restarted which helped after a couple of days. She was also started on olanzapine. Since then, she has been much better, she is not ripping things anymore, family gives her fidget toys which helps. She has urinary incontinence and wears Depends. Rare bowel incontinence. She does not sleep more than  3 hours at a time, up at night and sleeping in the daytime. She is fidgety sometimes but not like in January.   She was [facility] to [facility]/21 after an episode of generalized shaking, transient unresponsiveness with tongue hanging out of her mouth, leaning to the left side. This lasted 15 seconds, they noticed left side was weaker after then improved. 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 Her EEG was abnormal with intermittent left frontal theta slowing, single spike in the left frontotemporal region. She was discharged home on Depakote 500mg  BID but started complaining of a headache after 3 doses. She mostly used to have headaches when her BP is high. She does not recall being admitted to the hospital. Her son lives with her, [name] is her legal guardian and stays the night. Family administers medications. Her mother and grandmother had dementia. No family history of seizures.     Current Outpatient Medications on File Prior to Visit  Medication Sig Dispense Refill   acetaminophen (TYLENOL) 500 MG tablet Take 500 mg by mouth at bedtime.      albuterol (VENTOLIN HFA) 108 (90 Base) MCG/ACT inhaler Inhale 2 puffs into the lungs every 6 (six) hours as needed for wheezing or shortness of breath. 8 g 0   allopurinol (ZYLOPRIM) 300 MG tablet TAKE 1 TABLET BY MOUTH  DAILY 90 tablet 3   amLODipine (NORVASC) 5 MG tablet Take 1 tablet (5 mg total) by mouth daily. 90 tablet 3   ascorbic acid (VITAMIN C) 500 MG tablet Take 500 mg by mouth daily.     atorvastatin (LIPITOR) 40 MG tablet TAKE 1 TABLET BY MOUTH  DAILY 90 tablet 3   budesonide (PULMICORT) 0.25 MG/2ML nebulizer solution Take 2 mLs (0.25 mg total) by nebulization 2 (two) times daily. 120 mL 2   buPROPion (WELLBUTRIN XL) 300 MG 24 hr tablet Take 1 tablet (300 mg total) by mouth every morning. 90 tablet 1   carvedilol (COREG) 3.125 MG tablet Take 1 tablet (3.125 mg total)  by mouth 2 (two) times daily with a meal. 180 tablet 3   Cyanocobalamin (VITAMIN B12) 1000 MCG TBCR Take 1 tablet by mouth daily.     diclofenac Sodium (VOLTAREN) 1 % GEL Apply 4 g topically 4 (four) times daily as needed (for pain).     donepezil (ARICEPT) 5 MG tablet Take 1 tablet (5 mg total) by mouth at bedtime. 90 tablet 3   fluticasone (FLONASE) 50 MCG/ACT nasal spray Place into both nostrils as needed for allergies or rhinitis.     ipratropium-albuterol (DUONEB) 0.5-2.5 (3) MG/3ML SOLN Take 3 mLs by nebulization 2 (two) times daily. 360 mL 0   ketoconazole (NIZORAL) 2 % cream Apply 1 application topically 2 (two) times daily. 60 g 0   lamoTRIgine (LAMICTAL) 25 MG tablet Take 2 tablets twice a day 360 tablet 3   losartan (COZAAR) 100 MG tablet TAKE 1 TABLET BY MOUTH  DAILY 90 tablet 3   nystatin (MYCOSTATIN/NYSTOP) powder Apply 1 application topically 3 (three) times daily. 60 g 0   OLANZapine (ZYPREXA) 2.5 MG tablet Take 1 tab at  bedtime 90 tablet 1   pantoprazole (PROTONIX) 40 MG tablet TAKE 1 TABLET BY MOUTH  TWICE DAILY BEFORE A MEAL 180 tablet 3   No current facility-administered medications on file prior to visit.     Observations/Objective:   Vitals:   12/30/21 1345  Weight: 220 lb (99.8 kg)  Height: 5\' 3"  (1.6 m)   GEN:  The patient appears stated age and is in NAD.  Neurological examination: Patient is awake, alert, does not know birthday. Says "I don't know" when asked how she is doing. She says "I don't know what I'm doing." She has difficulty following commands, waves goodbye. Cranial nerves: Extraocular movements appear intact. No facial asymmetry. Motor: moves all extremities symmetrically, at least anti-gravity x 4.    Assessment and Plan:   This is an 81 yo LH woman with a history of hypertension, hyperlipidemia, paranoid schizophrenia, with moderate to severe dementia with behavioral disturbance and left temporal lobe epilepsy. She remains seizure-free since 02/2021  on Lamotrigine 50mg  BID, refills sent. She is on Donepezil 5mg  daily for dementia, although at this point there is likely no further benefit to this medication, consider discontinuation. There has been foul-smelling urine, poor appetite and sleep disturbances, she will follow-up on urine culture results with PCP. Continue follow-up with Psychiatry. Caregiver support provided, discussed transitioning to Memory Care. Follow-up in 6 months, call for any changes.    Follow Up Instructions:   -I discussed the assessment and treatment plan with the patient's daughter. The patient's daughter was provided an opportunity to ask questions and all were answered. The patient's daughter agreed with the plan and demonstrated an understanding of the instructions.   The patient's daughter was advised to call back or seek an in-person evaluation if the symptoms worsen or if the condition fails to improve as anticipated.     Cameron Sprang, MD  CC: Dr. [name]

## 2021-12-30 NOTE — Patient Instructions (Signed)
Good to see you. ? ?Refills sent for Lamotrigine 25mg : take 2 tablets twice a day and Donepezil 5mg  daily ? ?2. Follow-up on urine culture results with PCP ? ?3. Continue 24/7 care, recommend transitioning to Memory Care ? ?4. Follow-up in 6 months, call for any changes ? ? ?Seizure Precautions: ?1. If medication has been prescribed for you to prevent seizures, take it exactly as directed.  Do not stop taking the medicine without talking to your doctor first, even if you have not had a seizure in a long time.  ? ?2. Avoid activities in which a seizure would cause danger to yourself or to others.  Don't operate dangerous machinery, swim alone, or climb in high or dangerous places, such as on ladders, roofs, or girders.  Do not drive unless your doctor says you may. ? ?3. If you have any warning that you may have a seizure, lay down in a safe place where you can't hurt yourself.   ? ?4.  No driving for 6 months from last seizure, as per [facility].   Please refer to the following link on the [place] website for more information: http://www.epilepsyfoundation.org/answerplace/Social/driving/drivingu.cfm  ? ?5.  Maintain good sleep hygiene.  ? ?6.  Contact your doctor if you have any problems that may be related to the medicine you are taking. ? ?7.  Call 911 and bring the patient back to the ED if: ?      ? A.  The seizure lasts longer than 5 minutes.      ? B.  The patient doesn't awaken shortly after the seizure ? C.  The patient has new problems such as difficulty seeing, speaking or moving ? D.  The patient was injured during the seizure ? E.  The patient has a temperature over 102 F (39C) ? F.  The patient vomited and now is having trouble breathing ?      ? ?

## 2021-12-30 NOTE — Telephone Encounter (Signed)
Pt's daughter [name] cld to inquire if the results for her mother are back yet. Please call [name] at [phone]. Thank You! ?

## 2021-12-30 NOTE — Progress Notes (Signed)
Please inform patient of the following: ? ?Her urine culture shows that she has a UTI. Recommend starting macrobid 100mg  bid for 5 days. Please send in rx. Would like for them to let us know if her symptoms are not improving. ? ?Algis Greenhouse. [name], MD ?12/30/2021 4:02 PM  ?

## 2022-01-02 NOTE — Telephone Encounter (Signed)
See results note. 

## 2022-01-04 ENCOUNTER — Telehealth: Payer: Self-pay | Admitting: Family Medicine

## 2022-01-04 NOTE — Telephone Encounter (Signed)
Daughter called stated she has been waiting for a call from a Education officer, [employer] - stated she sent an email over the weekend. (?) would like a call back from office-   ?

## 2022-01-06 ENCOUNTER — Other Ambulatory Visit: Payer: Self-pay

## 2022-01-06 DIAGNOSIS — R300 Dysuria: Secondary | ICD-10-CM

## 2022-01-06 NOTE — Telephone Encounter (Signed)
Daughter needs a call from MA today - per daughter. [phone].  ?

## 2022-01-06 NOTE — Telephone Encounter (Signed)
Advised pt daughter [name] ok to drop off another urine specimen next week and future orders have been placed ?

## 2022-01-06 NOTE — Telephone Encounter (Signed)
Spoke w/ [name] pt daughter and stating urine is looking great and back to normal wanting to know if she can drop another specimen off next week to be tested to make sure she is in the clear. Please advise.  ?

## 2022-01-06 NOTE — Telephone Encounter (Signed)
Ok with me. Please place any necessary orders. 

## 2022-01-10 ENCOUNTER — Encounter: Payer: Self-pay | Admitting: Family Medicine

## 2022-01-10 ENCOUNTER — Telehealth (INDEPENDENT_AMBULATORY_CARE_PROVIDER_SITE_OTHER): Payer: Medicare Other | Admitting: Family Medicine

## 2022-01-10 ENCOUNTER — Other Ambulatory Visit: Payer: Self-pay

## 2022-01-10 DIAGNOSIS — M109 Gout, unspecified: Secondary | ICD-10-CM

## 2022-01-10 DIAGNOSIS — F03918 Unspecified dementia, unspecified severity, with other behavioral disturbance: Secondary | ICD-10-CM

## 2022-01-10 DIAGNOSIS — E785 Hyperlipidemia, unspecified: Secondary | ICD-10-CM | POA: Diagnosis not present

## 2022-01-10 DIAGNOSIS — G40909 Epilepsy, unspecified, not intractable, without status epilepticus: Secondary | ICD-10-CM

## 2022-01-10 DIAGNOSIS — R5381 Other malaise: Secondary | ICD-10-CM

## 2022-01-10 DIAGNOSIS — F209 Schizophrenia, unspecified: Secondary | ICD-10-CM | POA: Diagnosis not present

## 2022-01-10 DIAGNOSIS — I5032 Chronic diastolic (congestive) heart failure: Secondary | ICD-10-CM

## 2022-01-10 DIAGNOSIS — K219 Gastro-esophageal reflux disease without esophagitis: Secondary | ICD-10-CM | POA: Diagnosis not present

## 2022-01-10 MED ORDER — PANTOPRAZOLE SODIUM 40 MG PO TBEC: 40.0000 mg | DELAYED_RELEASE_TABLET | Freq: Every day | ORAL | 3 refills | Status: DC

## 2022-01-10 MED ORDER — NYSTATIN 100000 UNIT/GM EX POWD: 1.0000 "application " | Freq: Three times a day (TID) | CUTANEOUS | 0 refills | Status: DC | PRN

## 2022-01-10 MED ORDER — KETOCONAZOLE 2 % EX CREA: 1.0000 "application " | TOPICAL_CREAM | Freq: Two times a day (BID) | CUTANEOUS | 0 refills | Status: DC | PRN

## 2022-01-10 MED ORDER — ACETAMINOPHEN 500 MG PO TABS: ORAL_TABLET | ORAL | Status: DC

## 2022-01-10 NOTE — Assessment & Plan Note (Signed)
Follows with neurology. She is on lamictal '25mg'$  twice daily.  ?

## 2022-01-10 NOTE — Assessment & Plan Note (Signed)
Symptoms have progressed and patient's daughter is no longer able to manage her care at home. She needs assistance with most ADLs. Family is looking into placement into a nursing home. Referral to social work is pending.  ? ?She has followed with neurology and is currently on ariecpt '5mg'$  daily ?

## 2022-01-10 NOTE — Assessment & Plan Note (Signed)
Follows with cardiology. She has been doing better with volume management with her hospital bed. Continue losartan '100mg'$  daily and coreg 3.'125mg'$  twice daily.  ?

## 2022-01-10 NOTE — Assessment & Plan Note (Signed)
No recent gout flares. She is on allopurinol '300mg'$  daily.  ?

## 2022-01-10 NOTE — Progress Notes (Signed)
? ?  [name] is a 81 y.o. female who presents today for a virtual office visit. History provided by the patient's daughter.  ? ?Assessment/Plan:  ?Chronic Problems Addressed Today: ?Schizophrenia, chronic condition ([place]) ?Managed by psychiatry. She is on zyprexa 2.'5mg'$  daily and lamictal '25mg'$  twice daily.  ? ?Dementia ([place]) ?Symptoms have progressed and patient's daughter is no longer able to manage her care at home. She needs assistance with most ADLs. Family is looking into placement into a nursing home. Referral to social work is pending.  ? ?She has followed with neurology and is currently on ariecpt '5mg'$  daily ? ?Chronic diastolic heart failure ([place]) ?Follows with cardiology. She has been doing better with volume management with her hospital bed. Continue losartan '100mg'$  daily and coreg 3.'125mg'$  twice daily.  ? ?GERD ?Continue to protonix '40mg'$  once daily. ? ?Seizure disorder ([place]) ?Follows with neurology. She is on lamictal '25mg'$  twice daily.  ? ?Gout ?No recent gout flares. She is on allopurinol '300mg'$  daily.  ? ?Hyperlipidemia ?Stable on lipitor '40mg'$  daily.  ? ? ?  ?Subjective:  ?HPI: ? ?See A/p for status of chronic conditions.  History is provided by the patient's daughter. Patient's mental status has continued to deteriorate. Family is currently looking into nursing home placement. She needs assistance with most ADLs. Her son had previously been helping however he left a couple of months ago and the family are not able to manage without further assistance.  ? ?   ?  ?Objective/Observations  ?Physical Exam: ?Gen: NAD, resting comfortably ?Pulm: Normal work of breathing ? ?Virtual Visit via Video  ? ?I connected with [name] on 01/10/22 at 10:40 AM EDT by a video enabled telemedicine application and verified that I am speaking with the correct person using two identifiers. The limitations of evaluation and management by telemedicine and the availability of in person appointments were discussed.  The patient expressed understanding and agreed to proceed.  ? ?Patient location: Home ?Provider location: [place] ?Persons participating in the virtual visit: Myself, Patient, and Patient's Daughter ?   ? ?Algis Greenhouse. [name], MD ?01/10/2022 11:17 AM  ?

## 2022-01-10 NOTE — Assessment & Plan Note (Signed)
Continue to protonix '40mg'$  once daily. ?

## 2022-01-10 NOTE — Assessment & Plan Note (Signed)
Managed by psychiatry. She is on zyprexa 2.'5mg'$  daily and lamictal '25mg'$  twice daily.  ?

## 2022-01-10 NOTE — Assessment & Plan Note (Signed)
Stable on lipitor '40mg'$  daily.  ?

## 2022-01-12 ENCOUNTER — Telehealth (INDEPENDENT_AMBULATORY_CARE_PROVIDER_SITE_OTHER): Payer: Medicare Other | Admitting: Family Medicine

## 2022-01-12 ENCOUNTER — Other Ambulatory Visit: Payer: Self-pay

## 2022-01-12 ENCOUNTER — Telehealth: Payer: Self-pay

## 2022-01-12 VITALS — Ht 63.0 in | Wt 220.0 lb

## 2022-01-12 DIAGNOSIS — F03918 Unspecified dementia, unspecified severity, with other behavioral disturbance: Secondary | ICD-10-CM

## 2022-01-12 DIAGNOSIS — F209 Schizophrenia, unspecified: Secondary | ICD-10-CM | POA: Diagnosis not present

## 2022-01-12 NOTE — Telephone Encounter (Signed)
Please let the daughter know that [name] (the social worker they were with last year) contacted me and sent me an FL2 form to get started on. She told me that she would be in contact with them by Tuesday of next week. ? ?Algis Greenhouse. [name], MD ?01/12/2022 12:47 PM  ? ?

## 2022-01-12 NOTE — Telephone Encounter (Signed)
We can face over FL2 and office notes but we do not have a history and physical to fax over. ? ?This is typically something that is done by the attending physician at the nursing facility. I will not be her attending physician at the facility.  ? ?Algis Greenhouse. [name], MD ?01/12/2022 4:08 PM  ? ?

## 2022-01-12 NOTE — Telephone Encounter (Signed)
Please advise 

## 2022-01-12 NOTE — Telephone Encounter (Signed)
See note and advise °

## 2022-01-12 NOTE — Telephone Encounter (Signed)
Pt's daughter, [name], called back in to state the facility is also requiring history and physical faxed over.  ?

## 2022-01-12 NOTE — Assessment & Plan Note (Signed)
Symptoms continue to impress.  She needs assist with most ADLs.  She has an urgent need to be placed into a nursing home.  Her daughter is doing as much as she can at home however patient does need a higher level of care.  Had an extensive discussion with patient's daughter today regarding her placement.  She is agreeable to go to SNF or other assisted living facility at this point.  We reached out to social work to make sure this will be addressed ASAP.  They will be contacting her later today to help with placement. ?

## 2022-01-12 NOTE — Progress Notes (Signed)
? ?  [name] is a 81 y.o. female who presents today for a virtual office visit. ? ?Assessment/Plan:  ?Chronic Problems Addressed Today: ?Dementia ([place]) ?Symptoms continue to impress.  She needs assist with most ADLs.  She has an urgent need to be placed into a nursing home.  Her daughter is doing as much as she can at home however patient does need a higher level of care.  Had an extensive discussion with patient's daughter today regarding her placement.  She is agreeable to go to SNF or other assisted living facility at this point.  We reached out to social work to make sure this will be addressed ASAP.  They will be contacting her later today to help with placement. ? ? ?  ?Subjective:  ?HPI: ? ?History provided by the patient's daughter.  They are still working on placement. Patient's status is still declining.  She has been following more.  She is not continent of urine.  She needs assistance with most ADLs.  Daughter is no longer able to take care of her at home. ? ? ?   ?  ?Objective/Observations  ?Physical Exam: ?Gen: NAD, resting comfortably ?Pulm: Normal work of breathing ?Neuro: Grossly normal, moves all extremities ?Virtual Visit via Video  ? ?I connected with [name] on 01/12/22 at 10:40 AM EDT by a video enabled telemedicine application and verified that I am speaking with the correct person using two identifiers. The limitations of evaluation and management by telemedicine and the availability of in person appointments were discussed. The patient expressed understanding and agreed to proceed.  ? ?Patient location: Home ?Provider location: [place] ?Persons participating in the virtual visit: Myself and Patient and her daughter ? ?Time Spent: ?45 minutes of total time was spent on the date of the encounter performing the following actions: chart review prior to seeing the patient, obtaining history, performing a medically necessary exam, coordinating care with  referral and placement issues, counseling on the treatment plan, placing orders, and documenting in our EHR.  ? ?   ? ?Algis Greenhouse. [name], MD ?01/12/2022 11:38 AM  ?

## 2022-01-12 NOTE — Telephone Encounter (Signed)
Patient daughter talked to a facility and was told to call back to speak to Dr. [name] what the next step are.   ?

## 2022-01-12 NOTE — Telephone Encounter (Signed)
Spoke with [name] pt daughter and advised information; asked me to let Dr [name] know she also called [facility] and spoke with Theresia Majors. Was advised if he could fax a copy of the [place] form to her they could get started with moving her there for skilled nursing then transition info long term care in their facility. Fax # [phone] please advise ?

## 2022-01-13 ENCOUNTER — Telehealth: Payer: Self-pay

## 2022-01-13 NOTE — Telephone Encounter (Signed)
Spoke with patient daughter, requesting appointment for physical and FL2 form  ?Ok to schedule appointment ?Physical is requested to get into Nursing home facility   ?

## 2022-01-13 NOTE — Chronic Care Management (AMB) (Signed)
?  Chronic Care Management  ? ?Note ? ?01/13/2022 ?Name: MARSENA TAFF MRN: [id number] DOB: Oct 15, 1941 ? ?SAMAYRA HEBEL is a 81 y.o. year old female who is a primary care patient of Vivi Barrack, MD. LIV RALLIS is currently enrolled in care management services. An additional referral for LCSW  was placed.  ? ?Follow up plan: ?Telephone appointment with care management team member scheduled for:01/19/2022 ? ?Noreene Larsson, RMA ?Care Guide, Embedded Care Coordination ?[facility]  ?[place],  [address] ?Direct Dial: [phone] ?[email or web address] ?Website: [email or web address]  ? ?

## 2022-01-16 ENCOUNTER — Telehealth: Payer: Self-pay | Admitting: Family Medicine

## 2022-01-16 NOTE — Telephone Encounter (Signed)
Patient not in nursing home yet, need PHY and FL2 form prior to get into nursing home care ?

## 2022-01-16 NOTE — Telephone Encounter (Signed)
Stacy with [facility] states pt's daughter is wanting to have her mother referred for palliative care. Please call back at [phone] and then option #2.  ?

## 2022-01-16 NOTE — Telephone Encounter (Signed)
Attempted to call Stacy back and lvm with callback number and office hours to return our call.  ?

## 2022-01-16 NOTE — Telephone Encounter (Signed)
We do not do nursing home history and physicals. Kela should have the [place] [name], MD ?01/16/2022 1:54 PM  ? ?

## 2022-01-17 ENCOUNTER — Telehealth: Payer: Self-pay | Admitting: Family Medicine

## 2022-01-17 DIAGNOSIS — I509 Heart failure, unspecified: Secondary | ICD-10-CM | POA: Diagnosis not present

## 2022-01-17 DIAGNOSIS — I10 Essential (primary) hypertension: Secondary | ICD-10-CM | POA: Diagnosis not present

## 2022-01-17 NOTE — Telephone Encounter (Signed)
FL2 was sent last week to facility daughter [name] asked me to fax to as well as [name] per Dr [name] request.  ?

## 2022-01-17 NOTE — Telephone Encounter (Signed)
Pt's daughter, [name] would like a call from [place]. She states she wanted to talk more regarding their previous discussion.  ?

## 2022-01-18 NOTE — Telephone Encounter (Signed)
Returning [place] call from [facility]. Pt daughter [name] reached out to her stating that they are seeking palliative care for De Nurse, and Marzetta Board wanted to get the ok from Dr [name]. Also states that they can do the palliative care in home or at facility if she gets moved to [facility]. Please advise if ok with palliative care for patient. ?

## 2022-01-18 NOTE — Telephone Encounter (Signed)
See previews note 

## 2022-01-18 NOTE — Telephone Encounter (Signed)
Yes that is ok to refer to palliative care. ? ?Algis Greenhouse. [name], MD ?01/18/2022 10:37 AM  ? ?

## 2022-01-18 NOTE — Telephone Encounter (Signed)
Returning [place] call from [facility]. Pt daughter [name] reached out to her stating that they are seeking palliative care for [facility], and Marzetta Board wanted to get the ok from Dr [name]. Also states that they can do the palliative care in home or at facility if she gets moved to [facility]. Please advise if ok with palliative care for patient. ?

## 2022-01-18 NOTE — Telephone Encounter (Signed)
Spoke with Ms [name] Patient daughter, aware form FL2 was faxed as patient requested ?

## 2022-01-18 NOTE — Telephone Encounter (Signed)
Left message to return call to our office at their convenience.  

## 2022-01-18 NOTE — Telephone Encounter (Signed)
Called Stacy back to advise ok for referral to palliative care per Dr [name], left secure voice message with callback number.  ?

## 2022-01-19 ENCOUNTER — Telehealth: Payer: Self-pay

## 2022-01-19 ENCOUNTER — Ambulatory Visit (INDEPENDENT_AMBULATORY_CARE_PROVIDER_SITE_OTHER): Payer: Medicare Other | Admitting: Licensed Clinical Social Worker

## 2022-01-19 DIAGNOSIS — I5032 Chronic diastolic (congestive) heart failure: Secondary | ICD-10-CM

## 2022-01-19 DIAGNOSIS — F03911 Unspecified dementia, unspecified severity, with agitation: Secondary | ICD-10-CM

## 2022-01-19 DIAGNOSIS — Z636 Dependent relative needing care at home: Secondary | ICD-10-CM

## 2022-01-19 NOTE — Patient Instructions (Signed)
Visit Information  ? ?Thank you for taking time to visit with me today. Please don't hesitate to contact me if I can be of assistance to you before our next scheduled telephone appointment. ? ?Following are the goals we discussed today: Rehab placement ? ?Our next appointment is by telephone on March 30th at 3:00 ? ?Please call the care guide team at [phone] if you need to cancel or reschedule your appointment.  ? ?If you are experiencing a Mental Health or [place] or need someone to talk to, please call 1-800-273-TALK (toll free, 24 hour hotline)  ? ?Following is a copy of your full care plan:  ?Care Plan : [place]  ?Updates made by Maurine Cane, LCSW since 01/19/2022 12:00 AM  ?  ? ?Problem: Mobility and Independence   ?  ? ?Goal: Mobility and Independence Optimized with rehab at skilled nursing   ?Start Date: 01/19/2022  ?This Visit's Progress: On track  ?Priority: High  ?Note:   ?Current Barriers:  ?Level of Care Concerns:Facility placement (for short term rehab and possible long-term care) ? ?CSW Clinical Goal(s): Identify and reduce risk for harm and injury; present options for unmet needs related to: Facility placement (for skilled needs and possible Long-term care) ? ?Interventions: ?1:1 collaboration with primary care provider regarding development and update of comprehensive plan of care as evidenced by provider attestation and co-signature ?Inter-disciplinary care team collaboration (see longitudinal plan of care) ?Evaluation of current treatment plan related to  self management and patient's adherence to plan as established by provider ? ?Level of Care Concerns in a patient with CHF and Dementia:  (Status: New goal.) ?Current level of care: home, alone ?Evaluation of patient safety in current living environment ?Evaluation of current treatment plan related to facility placement:  ?Solution-Focused Strategies employed:  ?Active listening / Reflection utilized  ?Problem  Solving /Task Center strategies reviewed ?Caregiver stress acknowledged  ?Collaborated with primary care provider to update FL2, and patient's needs for skilled care with options to transition to long-term care.  ? ?Task & activities to accomplish goals: ?Keep appointment with Dr. [name] ?Dr. [name] will updated [facility] and fax to [place] along with [facility] & P once completed. ?  ? ?Consent to CCM Services: ?Ms. Osburn was given information about Chronic Care Management services including:  ?CCM service includes personalized support from designated clinical staff supervised by her physician, including individualized plan of care and coordination with other care providers ?24/7 contact phone numbers for assistance for urgent and routine care needs. ?Service will only be billed when office clinical staff spend 20 minutes or more in a month to coordinate care. ?Only one practitioner may furnish and bill the service in a calendar month. ?The patient may stop CCM services at any time (effective at the end of the month) by phone call to the office staff. ?The patient will be responsible for cost sharing (co-pay) of up to 20% of the service fee (after annual deductible is met). ? ?Patient's daughter agreed to services and verbal consent obtained.  ? ?Patient ' daughter verbalizes understanding of instructions and care plan provided today and agrees to view in [place]. Active MyChart status confirmed with patient.   ? ?Casimer Lanius, LCSW ?Licensed Clinical Social Worker [facility]  ?[place] ?[phone]  ? ? ?  ?

## 2022-01-19 NOTE — Chronic Care Management (AMB) (Signed)
?Chronic Care Management  ? Clinical Social Work Note ? ?01/19/2022 ?Name: [name] MRN: [id number] DOB: 1941/06/25 ? ?[name] is a 81 y.o. year old female who is a primary care patient of [name], Shelby Greenhouse, MD. The CCM team was consulted to assist the patient with chronic disease management and/or care coordination needs related to: Level of Care Concerns.  ? ?Collaboration with patient's daughter  for initial visit in response to provider referral for social work chronic care management and care coordination services.  ? ?Consent to Services:  ?The patient was given information about Chronic Care Management services, agreed to services, and gave verbal consent prior to initiation of services.  Please see initial visit note for detailed documentation.  ? ?Patient agreed to services and consent obtained.  ? ?Summary: Assessed patient's previous and current treatment, support system and barriers to care. Patient's daughter [name] provided all information during this encounter. She is the primary caregiver for patient.  Patient continues to experience difficulty with standing and being able to transfer..  See Care Plan below for interventions and patient self-care actives. ? ?Recommendation: Patient may benefit from, and daughter is in agreement for patient to go to skilled nursing for rehab with the option to explore long-term care. ? ?Follow up Plan: Patient's caregiver would like continued follow-up from CCM LCSW .  per caregiver's request CCM LCSW will follow up in 1 week.  They will call the office if needed prior to next encounter. ?  ?Assessment: Review of patient past medical history, allergies, medications, and health status, including review of relevant consultants reports was performed today as part of a comprehensive evaluation and provision of chronic care management and care coordination services.    ? ?SDOH (Social Determinants of Health) assessments and interventions performed:    ? ?Advanced Directives Status: See [place] application for related entries. ? ?CCM Care Plan ?Conditions to be addressed/monitored: CHF, HTN, and Dementia; Level of care concerns ? ?Care Plan : LCSW Care Plan  ?Updates made by Shelby Cane, LCSW since 01/19/2022 12:00 AM  ?  ? ?Problem: Mobility and Independence   ?  ? ?Goal: Mobility and Independence Optimized with rehab at skilled nursing   ?Start Date: 01/19/2022  ?This Visit's Progress: On track  ?Priority: High  ?Note:   ?Current Barriers:  ?Level of Care Concerns:Facility placement (for short term rehab and possible long-term care) ? ?CSW Clinical Goal(s): Identify and reduce risk for harm and injury; present options for unmet needs related to: Facility placement (for skilled needs and possible Long-term care) ? ?Interventions: ?1:1 collaboration with primary care provider regarding development and update of comprehensive plan of care as evidenced by provider attestation and co-signature ?Inter-disciplinary care team collaboration (see longitudinal plan of care) ?Evaluation of current treatment plan related to  self management and patient's adherence to plan as established by provider ? ?Level of Care Concerns in a patient with CHF and Dementia:  (Status: New goal.) ?Current level of care: home, alone ?Evaluation of patient safety in current living environment ?Evaluation of current treatment plan related to facility placement:  ?Solution-Focused Strategies employed:  ?Active listening / Reflection utilized  ?Problem Solving /Task Center strategies reviewed ?Caregiver stress acknowledged  ?Collaborated with primary care provider to update FL2, and patient's needs for skilled care with options to transition to long-term care.  ? ?Task & activities to accomplish goals: ?Keep appointment with Dr. Jerline Drake ?Dr. Jerline Drake will updated [facility] and fax to [place] along with  [facility] & P once completed. ?  ? Shelby Lanius, LCSW ?Licensed Clinical Social  Worker [name] Management  ?[place] ?[phone]  ?

## 2022-01-19 NOTE — Telephone Encounter (Signed)
Spoke with patient's daughter [name] and scheduled a Mychart Palliative Consult for 01/24/22 @ 2:30 PM.  ? ?Consent obtained; updated Outlook/Netsmart/Team List and Epic.  ? ?

## 2022-01-20 ENCOUNTER — Ambulatory Visit (INDEPENDENT_AMBULATORY_CARE_PROVIDER_SITE_OTHER): Payer: Medicare Other | Admitting: Family Medicine

## 2022-01-20 VITALS — BP 138/86 | HR 70 | Temp 97.9°F | Ht 63.0 in | Wt 219.8 lb

## 2022-01-20 DIAGNOSIS — K219 Gastro-esophageal reflux disease without esophagitis: Secondary | ICD-10-CM | POA: Diagnosis not present

## 2022-01-20 DIAGNOSIS — I10 Essential (primary) hypertension: Secondary | ICD-10-CM | POA: Diagnosis not present

## 2022-01-20 DIAGNOSIS — R5381 Other malaise: Secondary | ICD-10-CM

## 2022-01-20 DIAGNOSIS — F03918 Unspecified dementia, unspecified severity, with other behavioral disturbance: Secondary | ICD-10-CM | POA: Diagnosis not present

## 2022-01-20 DIAGNOSIS — N390 Urinary tract infection, site not specified: Secondary | ICD-10-CM | POA: Diagnosis not present

## 2022-01-20 DIAGNOSIS — E785 Hyperlipidemia, unspecified: Secondary | ICD-10-CM

## 2022-01-20 DIAGNOSIS — J45909 Unspecified asthma, uncomplicated: Secondary | ICD-10-CM | POA: Diagnosis not present

## 2022-01-20 DIAGNOSIS — G4733 Obstructive sleep apnea (adult) (pediatric): Secondary | ICD-10-CM

## 2022-01-20 DIAGNOSIS — R600 Localized edema: Secondary | ICD-10-CM

## 2022-01-20 DIAGNOSIS — M109 Gout, unspecified: Secondary | ICD-10-CM | POA: Diagnosis not present

## 2022-01-20 DIAGNOSIS — G40909 Epilepsy, unspecified, not intractable, without status epilepticus: Secondary | ICD-10-CM

## 2022-01-20 DIAGNOSIS — I5032 Chronic diastolic (congestive) heart failure: Secondary | ICD-10-CM

## 2022-01-20 DIAGNOSIS — L209 Atopic dermatitis, unspecified: Secondary | ICD-10-CM | POA: Diagnosis not present

## 2022-01-20 DIAGNOSIS — N1832 Chronic kidney disease, stage 3b: Secondary | ICD-10-CM

## 2022-01-20 DIAGNOSIS — F209 Schizophrenia, unspecified: Secondary | ICD-10-CM

## 2022-01-20 NOTE — Assessment & Plan Note (Signed)
Symptoms continue to progress.  She needs assistance with most ADLs.  Daughter is looking into short-term rehab and possibly long-term care at some point.  We will complete FL 2 form today and fax over to nursing home.  She is not oriented at baseline. ?

## 2022-01-20 NOTE — Assessment & Plan Note (Signed)
No recent gout flares on allopurinol '300mg'$  daily.  ?

## 2022-01-20 NOTE — Assessment & Plan Note (Signed)
At goal on coreg 3.'125mg'$  twice daily, amlodipine '5mg'$  daily, and losartan '100mg'$  daily.  ?

## 2022-01-20 NOTE — Assessment & Plan Note (Signed)
Follows with psychiatry.  She is on Zyprexa 2.5 mg daily and lamictal '25mg'$  twice daily.  ?

## 2022-01-20 NOTE — Assessment & Plan Note (Signed)
Follows with cardiology. Doing better volume management. Will continue losartan '100mg'$  daily and coreg 3.'125mg'$  twice daily.  ?

## 2022-01-20 NOTE — Assessment & Plan Note (Signed)
Follows with neurology.  No recent seizures.  She is on Lamictal 25 mg twice daily. ?

## 2022-01-20 NOTE — Assessment & Plan Note (Addendum)
Will place order for [facility] PT evaluation. Would likely benefit from SNF placement.  ?

## 2022-01-20 NOTE — Assessment & Plan Note (Signed)
Uses albuterol inhaler as needed.  ?

## 2022-01-20 NOTE — Assessment & Plan Note (Signed)
Stable on protonix '40mg'$  once daily.  ?

## 2022-01-20 NOTE — Assessment & Plan Note (Signed)
Stable on daily emolients and topical hydrocortisone cream as needed.  ?

## 2022-01-20 NOTE — Assessment & Plan Note (Signed)
Stable on lipitor '40mg'$  daily.  ?

## 2022-01-20 NOTE — Progress Notes (Addendum)
? ?[name] is a 81 y.o. female who presents today for an office visit. ? ?Assessment/Plan:  ?New/Acute Problems: ?UTI ?Recheck urine culture.  ? ?Skin Lump ?Consistent with hematoma vs lipoma. Will continue with watchful waiting.  ? ?Chronic Problems Addressed Today: ?Dementia ([place]) ?Symptoms continue to progress.  She needs assistance with most ADLs.  Daughter is looking into short-term rehab and possibly long-term care at some point.  We will complete [place] 2 form today and fax over to nursing home.  She is not oriented at baseline. ? ?Debility ?Will place order for [facility] PT evaluation. Would likely benefit from SNF placement.  ? ?Lower extremity edema ?Doing better with frequent leg elevation. ? ?Seizure disorder ([place]) ?Follows with neurology.  No recent seizures.  She is on Lamictal 25 mg twice daily. ? ?Schizophrenia, chronic condition ([place]) ?Follows with psychiatry.  She is on Zyprexa 2.5 mg daily and lamictal '25mg'$  twice daily.  ? ?Atopic dermatitis ?Stable on daily emolients and topical hydrocortisone cream as needed.  ? ?Reactive airway disease ?Uses albuterol inhaler as needed.  ? ?GERD ?Stable on protonix '40mg'$  once daily.  ? ?Chronic diastolic heart failure ([place]) ?Follows with cardiology. Doing better volume management. Will continue losartan '100mg'$  daily and coreg 3.'125mg'$  twice daily.  ? ?Essential hypertension ?At goal on coreg 3.'125mg'$  twice daily, amlodipine '5mg'$  daily, and losartan '100mg'$  daily.  ? ?Gout ?No recent gout flares on allopurinol '300mg'$  daily.  ? ?Hyperlipidemia ?Stable on lipitor '40mg'$  daily.  ? ?  ?Subjective:  ?HPI: ? ?Patient is here with her daughter today. They are in need of placement into a rehab facility and possibly long term care facility. Please see A/P for status of chronic conditions.  ? ?He has longstanding history of dementia that has been progressively worsening for the last several years and especially over the last few months.  Daughter currently cares for patient in her  home however this has become very difficult.  Patient needs assistance with most ADLs.  She is constantly dizzy and oriented and needs assistance with bathing, feeding, dressing, and total care.  She is immune ambulatory.  She is incontinent of both bowel and bladder.  She would benefit from rehab stay to help with strengthening exercises that she cannot get at home.  ? ?Daughter also reports a lump on her right arm after falling recently. Located in medial part of right upper extremity. ? ?She would also like to have a urine culture checked today.  ? ? ?PMH: ? ?The following were reviewed and entered/updated in epic: ?Past Medical History:  ?Diagnosis Date  ? CHF (congestive heart failure) ([place])   ? Chronic venous insufficiency   ? Colon, diverticulosis 06/2010  ? outpatient colonoscopy by Dr. Cristina Gong.  Need record  ? DDD (degenerative disc disease), lumbosacral   ? Depression   ? DJD (degenerative joint disease), multiple sites   ? Low back pain worst  ? GERD (gastroesophageal reflux disease)   ? Gout   ? Gout   ? Uric Acid level 4.2 on 300 allopurinol  ? History of cervical cancer 1972  ? Hx-TIA (transient ischemic attack) 05/13/2001  ? Right facial numbness  ? Hx-TIA (transient ischemic attack) 05/13/2001  ? Looking back in Epic, the patient had a hospitalization in July 2002 for evaluation of questionable TIA (s/s of right facial numbness associated with mild blurred vision in her right eye, diaphoresis, and mild confusion) with negative CT head and negative MRI/MRA and was started on Plavix after being  heparinized. She was to have possible follow up with Neurology for reevaluation of the need for Pl  ? Hyperlipidemia   ? Hypertension   ? Keloid 08/01/2018  ? Memory disorder 12/20/2016  ? Mitral valve prolapse 10/27/1999  ? Subsequent 2D echo show normal mitral valve  ? Osteoarthritis of hip   ? bilateral hips  ? Ovarian cancer ([place]) 1972  ? S/P oophorectomy  ? Psychosis ([place])   ? Recurrent boils   ? History of  MRSA skin infections with abscess  ? Seizures ([place])   ? Sensorineural hearing loss of both ears   ? Subdural hematoma 01/26/2017  ? bilateral  ? Uncomplicated opioid dependence ([place]) 01/31/2018  ? ?Patient Active Problem List  ? Diagnosis Date Noted  ? Seizure disorder ([place]) 03/29/2021  ? Dementia ([place]) 03/26/2021  ? Debility 11/06/2019  ? Venous stasis of lower extremity 10/14/2019  ? Hyperglycemia 05/06/2018  ? Constipation 04/30/2018  ? Osteoarthritis 01/31/2018  ? Lower extremity edema 07/31/2017  ? History of bilateral hip replacements   ? History of lumbar laminectomy for spinal cord decompression 02/02/2016  ? Atopic dermatitis 07/09/2015  ? Reactive airway disease 08/11/2014  ? History of cancer 08/11/2014  ? OSA (obstructive sleep apnea) 08/11/2014  ? Chronic kidney disease (CKD), stage III (moderate) ([place]) 10/04/2012  ? Schizophrenia, chronic condition ([place]) 01/20/2012  ? Postmenopausal atrophic vaginitis 11/01/2010  ? Chronic diastolic heart failure ([place]) 05/06/2010  ? Allergic rhinitis 10/14/2009  ? Obesity, Class II, BMI 35-39.9, with comorbidity 05/14/2009  ? Chronic prescription opiate use 04/29/2008  ? Normocytic anemia 09/25/2007  ? Hyperlipidemia 09/03/2006  ? Gout 09/03/2006  ? Essential hypertension 09/03/2006  ? GERD 09/03/2006  ? ?Past Surgical History:  ?Procedure Laterality Date  ? ABDOMINAL HYSTERECTOMY    ? 1972  ? BURR HOLE Bilateral 02/12/2017  ? Procedure: Haskell Flirt;  Surgeon: Earnie Larsson, MD;  Location: [place];  Service: Neurosurgery;  Laterality: Bilateral;  ? CERVICAL DISCECTOMY  7/07  ? C5-C6  ? JOINT REPLACEMENT    ? bilateral hip replacement  ? LUMBAR DISC SURGERY    ? L5-S1 7/07  ? LUMBAR LAMINECTOMY/DECOMPRESSION MICRODISCECTOMY N/A 02/02/2016  ? Procedure: L4-5 Decompression;  Surgeon: Marybelle Killings, MD;  Location: [place] [facility];  Service: Orthopedics;  Laterality: N/A;  ? OOPHORECTOMY    ? 1972 for ovarian cancer  ? ? ?Family History  ?Problem Relation Age of Onset  ? Diabetes Mother   ?  Heart disease Mother   ? Hearing loss Mother   ? Stroke Mother   ? Post-traumatic stress disorder Father   ? Schizophrenia Maternal Grandmother   ? Diabetes Maternal Grandfather   ? Other Brother   ?     brother died in mental health hospital  ? Kidney cancer Neg Hx   ? Bladder Cancer Neg Hx   ? ? ?Medications- reviewed and updated ?Current Outpatient Medications  ?Medication Sig Dispense Refill  ? acetaminophen (TYLENOL) 500 MG tablet '500mg'$  at night and every 6 hours as needed. 30 tablet   ? allopurinol (ZYLOPRIM) 300 MG tablet TAKE 1 TABLET BY MOUTH  DAILY 90 tablet 3  ? ascorbic acid (VITAMIN C) 500 MG tablet Take 500 mg by mouth daily.    ? atorvastatin (LIPITOR) 40 MG tablet TAKE 1 TABLET BY MOUTH  DAILY 90 tablet 3  ? buPROPion (WELLBUTRIN XL) 300 MG 24 hr tablet Take 1 tablet (300 mg total) by mouth every morning. 90 tablet 1  ? carvedilol (COREG) 3.125  MG tablet Take 1 tablet (3.125 mg total) by mouth 2 (two) times daily with a meal. 180 tablet 3  ? Cyanocobalamin (VITAMIN B12) 1000 MCG TBCR Take 1 tablet by mouth daily.    ? diclofenac Sodium (VOLTAREN) 1 % GEL Apply 4 g topically 4 (four) times daily as needed (for pain).    ? donepezil (ARICEPT) 5 MG tablet Take 1 tablet (5 mg total) by mouth at bedtime. 90 tablet 3  ? ketoconazole (NIZORAL) 2 % cream Apply 1 application. topically 2 (two) times daily as needed for irritation. 60 g 0  ? lamoTRIgine (LAMICTAL) 25 MG tablet Take 2 tablets twice a day 360 tablet 3  ? losartan (COZAAR) 100 MG tablet TAKE 1 TABLET BY MOUTH  DAILY 90 tablet 3  ? nystatin (MYCOSTATIN/NYSTOP) powder Apply 1 application. topically 3 (three) times daily as needed (yeast infection). 60 g 0  ? OLANZapine (ZYPREXA) 2.5 MG tablet Take 1 tab at bedtime 90 tablet 1  ? pantoprazole (PROTONIX) 40 MG tablet Take 1 tablet (40 mg total) by mouth daily. 90 tablet 3  ? amLODipine (NORVASC) 5 MG tablet Take 1 tablet (5 mg total) by mouth daily. 90 tablet 3  ? fluticasone (FLONASE) 50 MCG/ACT  nasal spray Place into both nostrils as needed for allergies or rhinitis. (Patient not taking: Reported on 01/20/2022)    ? ?No current facility-administered medications for this visit.  ? ? ?Allergies-revi

## 2022-01-20 NOTE — Assessment & Plan Note (Signed)
Doing better with frequent leg elevation. ?

## 2022-01-20 NOTE — Patient Instructions (Signed)
It was very nice to see you today! ? ?We will fax over the forms to the nursing home. ? ?We will check a urine culture today. ? ?I will refer your home health physical therapy.  ? ?Take care, ?Dr [name] ? ?PLEASE NOTE: ? ?If you had any lab tests please let us know if you have not heard back within a few days. You may see your results on mychart before we have a chance to review them but we will give you a call once they are reviewed by [place]. If we ordered any referrals today, please let us know if you have not heard from their office within the next week.  ? ?Please try these tips to maintain a healthy lifestyle: ? ?Eat at least 3 REAL meals and 1-2 snacks per day.  Aim for no more than 5 hours between eating.  If you eat breakfast, please do so within one hour of getting up.  ? ?Each meal should contain half fruits/vegetables, one quarter protein, and one quarter carbs (no bigger than a computer mouse) ? ?Cut down on sweet beverages. This includes juice, soda, and sweet tea.  ? ?Drink at least 1 glass of water with each meal and aim for at least 8 glasses per day ? ?Exercise at least 150 minutes every week.   ?

## 2022-01-22 LAB — URINE CULTURE
MICRO NUMBER:: 13176530
SPECIMEN QUALITY:: ADEQUATE

## 2022-01-23 NOTE — Progress Notes (Signed)
Please inform patient of the following: ? ?Urine culture shows UTI. Recommend starting keflex '500mg'$  bid x 7 days. ? ?[name]. Shelby Pain, MD ?01/23/2022 10:47 AM  ?

## 2022-01-24 ENCOUNTER — Other Ambulatory Visit: Payer: Self-pay

## 2022-01-24 ENCOUNTER — Other Ambulatory Visit: Payer: Self-pay | Admitting: *Deleted

## 2022-01-24 ENCOUNTER — Telehealth: Payer: Self-pay | Admitting: Pharmacist

## 2022-01-24 ENCOUNTER — Telehealth: Payer: Medicare Other | Admitting: Hospice

## 2022-01-24 DIAGNOSIS — F03918 Unspecified dementia, unspecified severity, with other behavioral disturbance: Secondary | ICD-10-CM

## 2022-01-24 DIAGNOSIS — G40909 Epilepsy, unspecified, not intractable, without status epilepticus: Secondary | ICD-10-CM

## 2022-01-24 DIAGNOSIS — F339 Major depressive disorder, recurrent, unspecified: Secondary | ICD-10-CM

## 2022-01-24 DIAGNOSIS — R269 Unspecified abnormalities of gait and mobility: Secondary | ICD-10-CM

## 2022-01-24 DIAGNOSIS — I5032 Chronic diastolic (congestive) heart failure: Secondary | ICD-10-CM

## 2022-01-24 DIAGNOSIS — N898 Other specified noninflammatory disorders of vagina: Secondary | ICD-10-CM

## 2022-01-24 DIAGNOSIS — Z515 Encounter for palliative care: Secondary | ICD-10-CM

## 2022-01-24 MED ORDER — CEPHALEXIN 500 MG PO CAPS: 500.0000 mg | ORAL_CAPSULE | Freq: Two times a day (BID) | ORAL | 0 refills | Status: AC

## 2022-01-24 MED ORDER — FLUCONAZOLE 150 MG PO TABS: 150.0000 mg | ORAL_TABLET | Freq: Once | ORAL | 0 refills | Status: AC

## 2022-01-24 NOTE — Progress Notes (Signed)
? ? ?Chronic Care Management ?Pharmacy Assistant  ? ?Name: [name]  MRN: [id number] DOB: 08-18-1941 ? ? ?Reason for Encounter: General Adherence Call ?  ? ?Recent office visits:  ?01/20/2022 OV (PCP) Vivi Barrack, MD; Urine culture shows UTI. Recommend starting keflex '500mg'$  bid x 7 days. ? ?01/12/2022 VV (PCP) Vivi Barrack, MD; no medication changes indicated. ? ?01/10/2022 VV (PCP) Vivi Barrack, MD; no medication changes indicated. ? ?Recent consult visits:  ?12/30/2021 VV (Neurology) Cameron Sprang, MD; no medication changes indicated. ? ?Hospital visits:  ?None in previous 6 months ? ?Medications: ?Outpatient Encounter Medications as of 01/24/2022  ?Medication Sig  ? acetaminophen (TYLENOL) 500 MG tablet '500mg'$  at night and every 6 hours as needed.  ? allopurinol (ZYLOPRIM) 300 MG tablet TAKE 1 TABLET BY MOUTH  DAILY  ? amLODipine (NORVASC) 5 MG tablet Take 1 tablet (5 mg total) by mouth daily.  ? ascorbic acid (VITAMIN C) 500 MG tablet Take 500 mg by mouth daily.  ? atorvastatin (LIPITOR) 40 MG tablet TAKE 1 TABLET BY MOUTH  DAILY  ? buPROPion (WELLBUTRIN XL) 300 MG 24 hr tablet Take 1 tablet (300 mg total) by mouth every morning.  ? carvedilol (COREG) 3.125 MG tablet Take 1 tablet (3.125 mg total) by mouth 2 (two) times daily with a meal.  ? cephALEXin (KEFLEX) 500 MG capsule Take 1 capsule (500 mg total) by mouth 2 (two) times daily for 7 days.  ? Cyanocobalamin (VITAMIN B12) 1000 MCG TBCR Take 1 tablet by mouth daily.  ? diclofenac Sodium (VOLTAREN) 1 % GEL Apply 4 g topically 4 (four) times daily as needed (for pain).  ? donepezil (ARICEPT) 5 MG tablet Take 1 tablet (5 mg total) by mouth at bedtime.  ? fluticasone (FLONASE) 50 MCG/ACT nasal spray Place into both nostrils as needed for allergies or rhinitis. (Patient not taking: Reported on 01/20/2022)  ? ketoconazole (NIZORAL) 2 % cream Apply 1 application. topically 2 (two) times daily as needed for irritation.  ? lamoTRIgine (LAMICTAL) 25  MG tablet Take 2 tablets twice a day  ? losartan (COZAAR) 100 MG tablet TAKE 1 TABLET BY MOUTH  DAILY  ? nystatin (MYCOSTATIN/NYSTOP) powder Apply 1 application. topically 3 (three) times daily as needed (yeast infection).  ? OLANZapine (ZYPREXA) 2.5 MG tablet Take 1 tab at bedtime  ? pantoprazole (PROTONIX) 40 MG tablet Take 1 tablet (40 mg total) by mouth daily.  ? ?No facility-administered encounter medications on file as of 01/24/2022.  ? ?Patient Questions: ?Have you had any problems recently with your health? ?Patient's daughter states the patient is currently taking antibiotics for an UTI. ? ?Have you had any problems with your pharmacy? ?Patient's daughter denies having any problems with their pharmacy. ? ?What issues or side effects are you having with your medications? ?Patient's daughter states the patient is having a hard time taking her antibiotic. She denies the patient having any side effects that she is aware of. ? ?What would you like me to pass along to [name], [name] for him to help you with?  ?Patient's daughter does not have anything for me to pass along at this time. ? ?What can we do to take care of you better? ?Patient's daughter does not have any suggestions. ? ?Patient's daughter states she is waiting on a PT evaluation for the patient. She states she would like a follow up call.  ?-I sent a message to Team [place]. ? ?Care Gaps: ?Medicare Annual Wellness: Completed  07/01/2021 ?Hemoglobin A1C: 6.7% on 03/27/2021 ?Colonoscopy: Aged out ?Dexa Scan: Completed ?Mammogram: Overdue since 10/05/2020 ? ?Future Appointments  ?Date Time Provider [place]  ?01/26/2022  3:00 PM [facility] SOCIAL [facility]  ?02/01/2022  3:00 PM Freada Bergeron, MD [facility]  ?03/28/2022  3:00 PM Arfeen, Arlyce Harman, MD [facility] None  ?07/10/2022  2:30 PM Cameron Sprang, MD [facility] None  ? ?Star Rating Drugs: ?Atorvastatin 40 mg last filled 12/13/2021 100 DS ? ?April D Calhoun, [place] ?Clinical  Pharmacist Assistant ?[phone]  ?

## 2022-01-24 NOTE — Progress Notes (Signed)
? ? ?Manufacturing engineer ?Community Palliative Care Consult Note ?Telephone: [phone]  ?Fax: [phone] ? ?PATIENT NAME: [name] ?[place] [address] ?[id number] (home)  ?DOB: Apr 14, 1941 ?MRN: [id number] ? ?PRIMARY CARE PROVIDER:    ?Vivi Barrack, MD,  ?[address] ?[place] [address] ?[phone] ? ?REFERRING PROVIDER:   ?Vivi Barrack, MD ?[address] ?[place],  [place] [address] ?[phone] ? ?RESPONSIBLE PARTY:   [name] - [place] [phone] ?[name] works remote with [employer].  ?Contact Information   ? ? Name Relation Home Work Mobile  ? Po[place],[name] Shelby [phone]    ? [name] Son [phone]    ? ?  ? ? ?TELEHEALTH VISIT STATEMENT ?Due to the COVID-19 crisis, this visit was done via telemedicine from my office and it was initiated and consent by this patient and or family.  ?I connected with patient OR PROXY by a telephone/video  and verified that I am speaking with the correct person. I discussed the limitations of evaluation and management by telemedicine. The patient/proxy expressed understanding and agreed to proceed. Patient is [facility] with [facility] during visit.  ?Palliative Care was asked to follow this patient to address advance care planning, complex medical decision making and goals of care clarification. This is the initial visit.  ? ?  ASSESSMENT AND / RECOMMENDATIONS:  ? ?Advance Care Planning: Our advance care planning conversation included a discussion about:    ?The value and importance of advance care planning  ?Difference between Hospice and Palliative care ?Exploration of goals of care in the event of a sudden injury or illness  ?Identification and preparation of a healthcare agent  ?Review and updating or creation of an  advance directive document . ?Decision not to resuscitate or to de-escalate disease focused treatments due to poor prognosis. ? ?CODE STATUS: She is a full code ? ?Goals of Care:  Goals include to maximize quality of life and symptom management ? ?I spent  16 minutes providing this initial consultation. More than 50% of the time in this consultation was spent on counseling patient and coordinating communication. ?-------------------------------------------------------------------------------------------------------------------------------------- ? ?Symptom Management/Plan: ?Dementia: progressive Memory/confusion in line with Dementia disease trajectory.  ?Incontinent of bladder FAST 6C, ambulatory with rolling walker. Encourage reminiscence, word search/puzzles, cueing for recollection.  Promote calm approach and engaging environment.  Optimize ongoing supportive care. Continue Donepezil and Olanzapine as ordered. Continue with Neurologist and Psychiatrist as planned.  Routine CBC CMP ? ?Seizure:Managed with Lamotrigene. Seizure precautions ? ?Depression: Managed with Bupropion.  Encourage socialization and participation in activities of choice.  Psych consult as needed ? ?Gait disturbance: PT/OT is in process for gait training and strengthening.  ? ?CHF: Not currently on diuretic. Continue Carvedilol, Losartan and Amlodipine to control blood pressure. Elevate BLE during the day as much as possible to promote circulation. Adhere to Fluid and salt limits. Monitor weight closely and report weight gain of 2 Ibs in a day or 5 Ibs in a week.  ? ? ?Follow up: Palliative care will continue to follow for complex medical decision making, advance care planning, and clarification of goals. Return 6 weeks or prn. Encouraged to call provider sooner with any concerns.  ? ?Family /Caregiver/Community Supports: Patient lives at home with [place]. [name] coordinates patient's care and  has arranged for additional private caregiver starting today. Strong family support system identified. [name] is interested in getting nursing aide from palliative care.  ACC RN to follow-up and assess need. ? ?  HOSPICE  ELIGIBILITY/DIAGNOSIS: TBD ? ?Chief Complaint: Initial Palliative care visit ? ?HISTORY OF PRESENT ILLNESS:  [name] is a 81 y.o. year old female  with multiple morbidities requiring close monitoring and with high risk of complications and  mortality: Dementia, seizure disorder, diastolic congestive heart failure, CKD 3, gait disturbance.  History of Depression and Schizophrenia. History obtained from review of EMR, discussion with primary team, caregiver, family and/or [name].  ?Independent interpretation of tests and reviewed as needed, available labs, patient records, imaging, studies and related documents from the EMR. ? ? ?PAST MEDICAL HISTORY:  ?Active Ambulatory Problems  ?  Diagnosis Date Noted  ? Hyperlipidemia 09/03/2006  ? Gout 09/03/2006  ? Obesity, Class II, BMI 35-39.9, with comorbidity 05/14/2009  ? Normocytic anemia 09/25/2007  ? Essential hypertension 09/03/2006  ? Chronic diastolic heart failure ([place]) 05/06/2010  ? Allergic rhinitis 10/14/2009  ? GERD 09/03/2006  ? Chronic prescription opiate use 04/29/2008  ? Postmenopausal atrophic vaginitis 11/01/2010  ? Schizophrenia, chronic condition ([place]) 01/20/2012  ? Chronic kidney disease (CKD), stage III (moderate) ([place]) 10/04/2012  ? Reactive airway disease 08/11/2014  ? History of cancer 08/11/2014  ? [place] (obstructive sleep apnea) 08/11/2014  ? Atopic dermatitis 07/09/2015  ? History of lumbar laminectomy for spinal cord decompression 02/02/2016  ? History of bilateral hip replacements   ? Lower extremity edema 07/31/2017  ? Osteoarthritis 01/31/2018  ? Constipation 04/30/2018  ? Hyperglycemia 05/06/2018  ? Venous stasis of lower extremity 10/14/2019  ? Debility 11/06/2019  ? Dementia ([place]) 03/26/2021  ? Seizure disorder ([place]) 03/29/2021  ? ?Resolved Ambulatory Problems  ?  Diagnosis Date Noted  ? Unspecified visual loss 04/13/2009  ? VENOUS INSUFFICIENCY 09/03/2006  ? INTERTRIGO, CANDIDAL 03/13/2008  ? [place] DISEASE  09/03/2006  ? LEG EDEMA, BILATERAL 06/19/2008  ? DYSPNEA ON EXERTION 03/11/2010  ? SHIN SPLINTS 08/26/2010  ? SPRAIN AND STRAIN OF CALCANEOFIBULAR 10/05/2010  ? Personal history of unspecified circulatory disease 09/03/2006  ? UPPER RESPIRATORY INFECTION, VIRAL 11/01/2010  ? Dysuria 11/28/2007  ? Hx-TIA (transient ischemic attack) 05/13/2001  ? Mitral valve prolapse 10/27/1999  ? Dysuria 06/22/2011  ? Cough 06/22/2011  ? Cough 06/23/2011  ? Night sweats 10/04/2012  ? Menopausal symptom 11/11/2012  ? Acute upper respiratory infections of unspecified site 12/20/2012  ? Healthcare maintenance 07/17/2013  ? UTI (urinary tract infection) 07/28/2013  ? Abnormal blood findings 08/13/2014  ? Back pain at L4-L5 level 05/03/2016  ? Memory disorder 12/20/2016  ? SDH (subdural hematoma) 02/10/2017  ? TBI (traumatic brain injury)   ? Subdural hemorrhage following injury without open intracranial wound and with loss of consciousness of 30 minutes or less, sequela ([place]) 03/08/2017  ? Frequent urination 07/31/2017  ? Diarrhea 11/20/2017  ? Uncomplicated opioid dependence ([place]) 01/31/2018  ? Dyslipidemia 04/30/2018  ? Keloid 08/01/2018  ? Pain of right thumb 01/06/2019  ? Cellulitis of right breast 06/13/2019  ? Acute CHF (congestive heart failure) ([place]) 09/22/2020  ? Acute on chronic diastolic CHF (congestive heart failure) ([place]) 09/22/2020  ? Acute on chronic diastolic CHF (congestive heart failure) ([place]) 03/26/2021  ? TIA (transient ischemic attack) 03/26/2021  ? Obesity, Class III, BMI 40-49.9 (morbid obesity) ([place]) 03/26/2021  ? ?Past Medical History:  ?Diagnosis Date  ? CHF (congestive heart failure) ([place])   ? Chronic venous insufficiency   ? Colon, diverticulosis 06/2010  ? DDD (degenerative disc disease), lumbosacral   ? Depression   ? DJD (degenerative joint disease), multiple sites   ?  GERD (gastroesophageal reflux disease)   ? Gout   ? Gout   ? History of cervical cancer 1972  ? Hypertension   ? Osteoarthritis of hip   ?  Ovarian cancer ([place]) 1972  ? Psychosis ([place])   ? Recurrent boils   ? Seizures ([place])   ? Sensorineural hearing loss of both ears   ? Subdural hematoma 01/26/2017  ? ? ?SOCIAL HX:  ?Social History  ? ?Tobacco Use  ? [place]

## 2022-01-26 ENCOUNTER — Ambulatory Visit: Payer: Medicare Other | Admitting: Licensed Clinical Social Worker

## 2022-01-26 DIAGNOSIS — I10 Essential (primary) hypertension: Secondary | ICD-10-CM

## 2022-01-26 DIAGNOSIS — F03918 Unspecified dementia, unspecified severity, with other behavioral disturbance: Secondary | ICD-10-CM

## 2022-01-26 NOTE — Chronic Care Management (AMB) (Signed)
?  Chronic Care Management  ? Clinical Social Work Note ? ?01/26/2022 ?Name: [name] MRN: [id number] DOB: April 12, 1941 ? ?[name] is a 81 y.o. year old female who is a primary care patient of [name], Algis Greenhouse, MD. The CCM team was consulted to assist the patient with chronic disease management and/or care coordination needs related to: Level of Care Concerns.  ? ?Collaboration with patient's daughter  for follow up visit in response to provider referral for social work chronic care management and care coordination services.  ? ?Consent to Services:  ?The patient was given information about Chronic Care Management services, agreed to services, and gave verbal consent prior to initiation of services.  Please see initial visit note for detailed documentation.  ? ?Patient's daughter agreed to services and consent obtained.  ? ?Summary: Patient's daughter [name] provided all information during this encounter. She is making progress with getting all needed information for placement. Current waiting on [facility] PT evaluation.  HH order has been placed by PCP .  LCSW will f/u with referral coordinator on progress. ( In-basket message sent to [facility]) See Care Plan below for interventions and patient self-care actives. ? ? ?Follow up Plan: Patient's caregiver would like continued follow-up from CCM LCSW .  per caregiver's request CCM LCSW will follow up in 2 weeks.  They will call the office if needed prior to next encounter. ?  ?Assessment: Review of patient past medical history, allergies, medications, and health status, including review of relevant consultants reports was performed today as part of a comprehensive evaluation and provision of chronic care management and care coordination services.    ? ?SDOH (Social Determinants of Health) assessments and interventions performed:   ? ?Advanced Directives Status: Not addressed in this encounter. ? ?CCM Care Plan ?Conditions to be addressed/monitored: Dementia; Level of  care concerns ? ?Care Plan : LCSW Care Plan  ?Updates made by Maurine Cane, LCSW since 01/26/2022 12:00 AM  ?  ? ?Problem: Mobility and Independence   ?  ? ?Goal: Mobility and Independence Optimized with rehab at skilled nursing   ?Start Date: 01/19/2022  ?This Visit's Progress: On track  ?Recent Progress: On track  ?Priority: High  ?Note:   ?Current Barriers:  ?Level of Care Concerns:Facility placement (for short term rehab and possible long-term care) ? ?CSW Clinical Goal(s): Identify and reduce risk for harm and injury; present options for unmet needs related to: Facility placement (for skilled needs and possible Long-term care) ? ?Interventions: ?1:1 collaboration with primary care provider regarding development and update of comprehensive plan of care as evidenced by provider attestation and co-signature ?Inter-disciplinary care team collaboration (see longitudinal plan of care) ?Evaluation of current treatment plan related to  self management and patient's adherence to plan as established by provider ? ?Level of Care Concerns in a patient with CHF and Dementia:  (Status: Goal on Track (progressing): YES.) ?Current level of care: home, alone ?Evaluation of patient safety in current living environment ?Evaluation of current treatment plan related to facility placement:  ?Solution-Focused Strategies employed:  ?Active listening / Reflection utilized  ?Problem Solving /Task Center strategies reviewed ?Caregiver stress acknowledged  ? ?Task & activities to accomplish goals: ?Continue to work with [place] for placement ?Continue with self care for yourself  ?Follow up with [facility] for physical therapy  ?  ?  ?Casimer Lanius, LCSW ?Licensed Clinical Social Worker [employer]  ?[place] ?[phone]  ? ? ?

## 2022-01-26 NOTE — Patient Instructions (Addendum)
Visit Information ? ?Thank you for taking time to visit with me today. Please don't hesitate to contact me if I can be of assistance to you before our next scheduled telephone appointment. ? ?Following are the goals we discussed today: Facility placement  ?Task & activities to accomplish goals: ?Continue to work with [place] for placement ?Continue with self care for yourself  ?Follow up with [facility] for physical therapy  ? ?Our next appointment is by telephone on April 13th at 3:00 ? ?Please call the care guide team at [phone] if you need to cancel or reschedule your appointment.  ? ?If you are experiencing a Mental Health or [place] or need someone to talk to, please call the Suicide and Crisis Lifeline: 988 ?call the [place] National Suicide Prevention Lifeline: 640-395-0243 or TTY: (623) 014-5486 TTY 978-146-3387) to talk to a trained counselor ?call 1-800-273-TALK (toll free, 24 hour hotline) ?go to [facility] [address], [place] [phone])  ? ?Patient's daughter verbalizes understanding of instructions and care plan provided today and agrees to view in [place]. Active MyChart status confirmed with patient.   ? ?  ?Casimer Lanius, LCSW ?Licensed Clinical Social Worker [employer]  ?[place] ?[phone]  ?

## 2022-01-27 DIAGNOSIS — I10 Essential (primary) hypertension: Secondary | ICD-10-CM

## 2022-01-27 DIAGNOSIS — F03911 Unspecified dementia, unspecified severity, with agitation: Secondary | ICD-10-CM

## 2022-01-27 DIAGNOSIS — I5032 Chronic diastolic (congestive) heart failure: Secondary | ICD-10-CM

## 2022-01-27 DIAGNOSIS — F03918 Unspecified dementia, unspecified severity, with other behavioral disturbance: Secondary | ICD-10-CM

## 2022-01-29 NOTE — Progress Notes (Deleted)
?Cardiology Office Note:   ? ?Date:  01/29/2022  ? ?ID:  DAILYNN [name], DOB [birth year], MRN [id number] ? ?PCP:  Vivi Barrack, MD ?  ?[place]  ?Cardiologist:  Freada Bergeron, MD  ?Advanced Practice Provider:  No care team member to display ?Electrophysiologist:  None  ? ?Referring MD: Vivi Barrack, MD  ? ? ? ?History of Present Illness:   ? ?[name] is a 81 y.o. female with a hx of  hx of chronic diastolic HF, TIA, schizophrenia, dementia, CKD, and ovarian cancer s/p oophrectomy who is undergoing diuresis for significant volume overload in the setting of CKD and diastolic HF now presenting to clinic for follow-up. ?  ?Patient admitted from 11/24-11/27/21 for worsening SOB and hypoxia. She was diuresed with IV lasix and treated with antibiotics for COPD exacerbation. Her O2 status improved and she was discharged home. Daughter admits that she left the hospital prematurely as she was concerned about the lasix affecting her renal function and decided to take the patient home. ?  ?Saw Richardson Dopp on 09/29/20. During that visit, the patient was continuing to have shortness of breath but was overall improved since admission. Weight was stable but with continued LE edema. She was started on lasix '40mg'$  daily and imdur '30mg'$  daily.  ?  ?The patient went to the ED 10/13/20 with chest pain. Trop negative. BNP 190. CXR with some atelectasis in left lung base but no significant effusions or pulmonary edema. Cr 1.58. She was given IV lasix with good response and discharged home. ?  ?During visit on12/22/21, the daughter was concerned that the patient was intermittently more confused and out of it which she attributed to her underlying dementia. Otherwise volume status was slowly improving. We had started on on torsemide which was subsequently stopped by Dr. Royce Macadamia due to rising Cr and she was transitioned to lasix '80mg'$  daily. ? ?During last visit on 12/31/20, the patient was doing  well on lasix '80mg'$  daily. Cr improved to 1.6. Coreg was lowered to 3.'125mg'$  BID and amlodipine was decreased to '5mg'$  daily due to soft Bps. ? ?Was last seen in clinic on 02/2021 where she was doing well from a CV standpoint. ? ?Today, *** ? ?Past Medical History:  ?Diagnosis Date  ? CHF (congestive heart failure) ([place])   ? Chronic venous insufficiency   ? Colon, diverticulosis 06/2010  ? outpatient colonoscopy by Dr. Cristina Gong.  Need record  ? DDD (degenerative disc disease), lumbosacral   ? Depression   ? DJD (degenerative joint disease), multiple sites   ? Low back pain worst  ? GERD (gastroesophageal reflux disease)   ? Gout   ? Gout   ? Uric Acid level 4.2 on 300 allopurinol  ? History of cervical cancer 1972  ? Hx-TIA (transient ischemic attack) 05/13/2001  ? Right facial numbness  ? Hx-TIA (transient ischemic attack) 05/13/2001  ? Looking back in Epic, the patient had a hospitalization in July 2002 for evaluation of questionable TIA (s/s of right facial numbness associated with mild blurred vision in her right eye, diaphoresis, and mild confusion) with negative CT head and negative MRI/MRA and was started on Plavix after being heparinized. She was to have possible follow up with Neurology for reevaluation of the need for Pl  ? Hyperlipidemia   ? Hypertension   ? Keloid 08/01/2018  ? Memory disorder 12/20/2016  ? Mitral valve prolapse 10/27/1999  ? Subsequent 2D echo show normal mitral valve  ?  Osteoarthritis of hip   ? bilateral hips  ? Ovarian cancer ([place]) 31-Jan-1971  ? S/P oophorectomy  ? Psychosis ([place])   ? Recurrent boils   ? History of MRSA skin infections with abscess  ? Seizures ([place])   ? Sensorineural hearing loss of both ears   ? Subdural hematoma 01/26/2017  ? bilateral  ? Uncomplicated opioid dependence ([place]) 01/31/2018  ? ? ?Past Surgical History:  ?Procedure Laterality Date  ? ABDOMINAL HYSTERECTOMY    ? 1972  ? BURR HOLE Bilateral 02/12/2017  ? Procedure: Haskell Flirt;  Surgeon: Earnie Larsson, MD;  Location: [place];  Service: Neurosurgery;  Laterality: Bilateral;  ? CERVICAL DISCECTOMY  7/07  ? C5-C6  ? JOINT REPLACEMENT    ? bilateral hip replacement  ? LUMBAR DISC SURGERY    ? L5-S1 7/07  ? LUMBAR LAMINECTOMY/DECOMPRESSION MICRODISCECTOMY N/A 02/02/2016  ? Procedure: L4-5 Decompression;  Surgeon: Marybelle Killings, MD;  Location: [place];  Service: Orthopedics;  Laterality: N/A;  ? OOPHORECTOMY    ? 1972 for ovarian cancer  ? ? ?Current Medications: ?No outpatient medications have been marked as taking for the 02/01/22 encounter (Appointment) with Freada Bergeron, MD.  ?  ? ?Allergies:   Aspirin and Sulfamethoxazole-trimethoprim  ? ?Social History  ? ?Socioeconomic History  ? Marital status: Widowed  ?  Spouse name: Not on file  ? Number of children: 3  ? Years of education: 76  ? Highest education level: Not on file  ?Occupational History  ? Occupation: Retired  ?Tobacco Use  ? Smoking status: Former  ?  Packs/day: 0.10  ?  Years: 35.00  ?  Pack years: 3.50  ?  Types: Cigarettes  ?  Quit date: 04/08/2013  ?  Years since quitting: 8.8  ? Smokeless tobacco: Never  ? Tobacco comments:  ?  smoked a few when her son died in 2013-01-30   ?Vaping Use  ? Vaping Use: Never used  ?Substance and Sexual Activity  ? Alcohol use: No  ?  Alcohol/week: 0.0 standard drinks  ? Drug use: No  ? Sexual activity: Never  ?Other Topics Concern  ? Not on file  ?Social History Narrative  ? Lives with daughter in [place]. Daughter is primary caretaker, surrogate decision-maker, and arranges for [facility] aide and other caregivers to supervise Ms. Silvestri at home. Ambulatory  ? ?Social Determinants of Health  ? ?Financial Resource Strain: Not on file  ?Food Insecurity: Not on file  ?Transportation Needs: Not on file  ?Physical Activity: Not on file  ?Stress: Not on file  ?Social Connections: Not on file  ?  ? ?Family History: ?The patient's family history includes Diabetes in her maternal grandfather and mother; Hearing loss in her mother; Heart disease in her  mother; Other in her brother; Post-traumatic stress disorder in her father; Schizophrenia in her maternal grandmother; Stroke in her mother. There is no history of Kidney cancer or Bladder Cancer. ? ?ROS:   ?Please see the history of present illness.    ?Review of Systems  ?Constitutional:  Negative for chills and fever.  ?Eyes:  Negative for blurred vision.  ?Respiratory:  Negative for cough, shortness of breath and stridor.   ?Cardiovascular:  Positive for leg swelling. Negative for chest pain, palpitations, orthopnea, claudication and PND.  ?Gastrointestinal:  Negative for nausea and vomiting.  ?Genitourinary:  Negative for hematuria.  ?Musculoskeletal:  Positive for joint pain.  ?Neurological:  Negative for dizziness and loss of consciousness.  ?Endo/Heme/Allergies:  Negative for polydipsia.  ?Psychiatric/Behavioral:  Positive for memory loss. Negative for substance abuse.   ? ?EKGs/Labs/Other Studies Reviewed:   ? ?The following studies were reviewed today: ?TTE 09/23/20: ?IMPRESSIONS  ? 1. Left ventricular ejection fraction, by estimation, is 65 to 70%. The  ?left ventricle has normal function. The left ventricle has no regional  ?wall motion abnormalities. There is mild left ventricular hypertrophy.  ?Left ventricular diastolic parameters  ?are consistent with Grade I diastolic dysfunction (impaired relaxation).  ? 2. Right ventricular systolic function is normal. The right ventricular  ?size is normal. There is mildly elevated pulmonary artery systolic  ?pressure.  ? 3. The mitral valve is normal in structure. No evidence of mitral valve  ?regurgitation. No evidence of mitral stenosis.  ? 4. The aortic valve is normal in structure. Aortic valve regurgitation is  ?not visualized. No aortic stenosis is present.  ? 5. The inferior vena cava is normal in size with greater than 50%  ?respiratory variability, suggesting right atrial pressure of 3 mmHg.  ? ?FINDINGS  ? Left Ventricle: Left ventricular ejection  fraction, by estimation, is 65  ?to 70%. The left ventricle has normal function. The left ventricle has no  ?regional wall motion abnormalities. The left ventricular internal cavity  ?size was normal in size

## 2022-02-01 ENCOUNTER — Encounter: Payer: Self-pay | Admitting: Cardiology

## 2022-02-01 ENCOUNTER — Ambulatory Visit (INDEPENDENT_AMBULATORY_CARE_PROVIDER_SITE_OTHER): Payer: Medicare Other | Admitting: Cardiology

## 2022-02-01 VITALS — BP 144/80 | HR 72 | Ht 63.0 in | Wt 219.0 lb

## 2022-02-01 DIAGNOSIS — R0602 Shortness of breath: Secondary | ICD-10-CM

## 2022-02-01 DIAGNOSIS — N1832 Chronic kidney disease, stage 3b: Secondary | ICD-10-CM

## 2022-02-01 DIAGNOSIS — I5032 Chronic diastolic (congestive) heart failure: Secondary | ICD-10-CM

## 2022-02-01 DIAGNOSIS — E785 Hyperlipidemia, unspecified: Secondary | ICD-10-CM | POA: Diagnosis not present

## 2022-02-01 DIAGNOSIS — I10 Essential (primary) hypertension: Secondary | ICD-10-CM

## 2022-02-01 NOTE — Patient Instructions (Signed)
Medication Instructions:  ? ?Your physician recommends that you continue on your current medications as directed. Please refer to the Current Medication list given to you today. ? ?*If you need a refill on your cardiac medications before your next appointment, please call your pharmacy* ? ? ? ?Follow-Up: ?At [facility], you and your health needs are our priority.  As part of our continuing mission to provide you with exceptional heart care, we have created designated Provider Care Teams.  These Care Teams include your primary Cardiologist (physician) and Advanced Practice Providers (APPs -  Physician Assistants and Nurse Practitioners) who all work together to provide you with the care you need, when you need it. ? ?We recommend signing up for the patient portal called "MyChart".  Sign up information is provided on this After Visit Summary.  MyChart is used to connect with patients for Virtual Visits (Telemedicine).  Patients are able to view lab/test results, encounter notes, upcoming appointments, etc.  Non-urgent messages can be sent to your provider as well.   ?To learn more about what you can do with MyChart, go to [email or web address].   ? ?Your next appointment:   ?6 month(s) ? ?The format for your next appointment:   ?In Person ? ?Provider:   ?Freada Bergeron, MD { ? ? ? ?

## 2022-02-01 NOTE — Progress Notes (Signed)
?Cardiology Office Note:   ? ?Date:  02/01/2022  ? ?ID:  [name], DOB 11/26/1940, MRN [id number] ? ?PCP:  Shelby Barrack, MD ?  ?[place]  ?Cardiologist:  Shelby Bergeron, MD  ?Advanced Practice Provider:  No care team member to display ?Electrophysiologist:  None  ? ?Referring MD: Shelby Barrack, MD  ? ? ? ?History of Present Illness:   ? ?[name] is a 81 y.o. female with a hx of  hx of chronic diastolic HF, TIA, schizophrenia, dementia, CKD, and ovarian cancer s/p oophrectomy who is undergoing diuresis for significant volume overload in the setting of CKD and diastolic HF now presenting to clinic for follow-up. ?  ?Patient admitted from 11/24-11/27/21 for worsening SOB and hypoxia. She was diuresed with IV lasix and treated with antibiotics for COPD exacerbation. Her O2 status improved and she was discharged home. Daughter admits that she left the hospital prematurely as she was concerned about the lasix affecting her renal function and decided to take the patient home. ?  ?Saw Richardson Drake on 09/29/20. During that visit, the patient was continuing to have shortness of breath but was overall improved since admission. Weight was stable but with continued LE edema. She was started on lasix '40mg'$  daily and imdur '30mg'$  daily.  ?  ?The patient went to the ED 10/13/20 with chest pain. Trop negative. BNP 190. CXR with some atelectasis in left lung base but no significant effusions or pulmonary edema. Cr 1.58. She was given IV lasix with good response and discharged home. ?  ?During visit on12/22/21, the daughter was concerned that the patient was intermittently more confused and out of it which she attributed to her underlying dementia. Otherwise volume status was slowly improving. We had started on on torsemide which was subsequently stopped by Dr. Royce Macadamia due to rising Cr and she was transitioned to lasix '80mg'$  daily. ? ?During last visit on 12/31/20, the patient was doing  well on lasix '80mg'$  daily. Cr improved to 1.6. Coreg was lowered to 3.'125mg'$  BID and amlodipine was decreased to '5mg'$  daily due to soft Bps. ? ?Was last seen in clinic on 02/2021 where she was doing well from a CV standpoint. ? ?Today, she is accompanied daughter and doing well. Her LE edema has significantly improved. Her daughter reports that laying down in bed has helped resolve the swelling and she is no longer taking Lasix regularly. She denies chest pain, chest pressure, dyspnea at rest or with exertion, palpitations, PND, or orthopnea.  ? ?Recently, she endorses constipation and having difficulty pushing stool out. She was not able to complete a bowel movement this morning. Her daughter believes the constipation is related to her being sedentary in bed.  ? ?Past Medical History:  ?Diagnosis Date  ? CHF (congestive heart failure) ([place])   ? Chronic venous insufficiency   ? Colon, diverticulosis 06/2010  ? outpatient colonoscopy by Dr. Cristina Drake.  Need record  ? DDD (degenerative disc disease), lumbosacral   ? Depression   ? DJD (degenerative joint disease), multiple sites   ? Low back pain worst  ? GERD (gastroesophageal reflux disease)   ? Gout   ? Gout   ? Uric Acid level 4.2 on 300 allopurinol  ? History of cervical cancer 1972  ? Hx-TIA (transient ischemic attack) 05/13/2001  ? Right facial numbness  ? Hx-TIA (transient ischemic attack) 05/13/2001  ? Looking back in Epic, the patient had a hospitalization in July 2002 for  evaluation of questionable TIA (s/s of right facial numbness associated with mild blurred vision in her right eye, diaphoresis, and mild confusion) with negative CT head and negative MRI/MRA and was started on Plavix after being heparinized. She was to have possible follow up with Neurology for reevaluation of the need for Pl  ? Hyperlipidemia   ? Hypertension   ? Keloid 08/01/2018  ? Memory disorder 12/20/2016  ? Mitral valve prolapse 10/27/1999  ? Subsequent 2D echo show normal mitral valve   ? Osteoarthritis of hip   ? bilateral hips  ? Ovarian cancer ([place]) 1972  ? S/P oophorectomy  ? Psychosis ([place])   ? Recurrent boils   ? History of MRSA skin infections with abscess  ? Seizures ([place])   ? Sensorineural hearing loss of both ears   ? Subdural hematoma ([place]) 01/26/2017  ? bilateral  ? Uncomplicated opioid dependence ([place]) 01/31/2018  ? ? ?Past Surgical History:  ?Procedure Laterality Date  ? ABDOMINAL HYSTERECTOMY    ? 1972  ? BURR HOLE Bilateral 02/12/2017  ? Procedure: Haskell Flirt;  Surgeon: Shelby Larsson, MD;  Location: [place];  Service: Neurosurgery;  Laterality: Bilateral;  ? CERVICAL DISCECTOMY  7/07  ? C5-C6  ? JOINT REPLACEMENT    ? bilateral hip replacement  ? LUMBAR DISC SURGERY    ? L5-S1 7/07  ? LUMBAR LAMINECTOMY/DECOMPRESSION MICRODISCECTOMY N/A 02/02/2016  ? Procedure: L4-5 Decompression;  Surgeon: Shelby Killings, MD;  Location: [place];  Service: Orthopedics;  Laterality: N/A;  ? OOPHORECTOMY    ? 1972 for ovarian cancer  ? ? ?Current Medications: ?Current Meds  ?Medication Sig  ? acetaminophen (TYLENOL) 500 MG tablet '500mg'$  at night and every 6 hours as needed.  ? allopurinol (ZYLOPRIM) 300 MG tablet TAKE 1 TABLET BY MOUTH  DAILY  ? ascorbic acid (VITAMIN C) 500 MG tablet Take 500 mg by mouth daily.  ? atorvastatin (LIPITOR) 40 MG tablet TAKE 1 TABLET BY MOUTH  DAILY  ? buPROPion (WELLBUTRIN XL) 300 MG 24 hr tablet Take 1 tablet (300 mg total) by mouth every morning.  ? carvedilol (COREG) 3.125 MG tablet Take 1 tablet (3.125 mg total) by mouth 2 (two) times daily with a meal.  ? Cyanocobalamin (VITAMIN B12) 1000 MCG TBCR Take 1 tablet by mouth daily.  ? diclofenac Sodium (VOLTAREN) 1 % GEL Apply 4 g topically 4 (four) times daily as needed (for pain).  ? donepezil (ARICEPT) 5 MG tablet Take 1 tablet (5 mg total) by mouth at bedtime.  ? fluticasone (FLONASE) 50 MCG/ACT nasal spray Place into both nostrils as needed for allergies or rhinitis.  ? ketoconazole (NIZORAL) 2 % cream Apply 1 application.  topically 2 (two) times daily as needed for irritation.  ? lamoTRIgine (LAMICTAL) 25 MG tablet Take 2 tablets twice a day  ? losartan (COZAAR) 100 MG tablet TAKE 1 TABLET BY MOUTH  DAILY  ? nystatin (MYCOSTATIN/NYSTOP) powder Apply 1 application. topically 3 (three) times daily as needed (yeast infection).  ? OLANZapine (ZYPREXA) 2.5 MG tablet Take 1 tab at bedtime  ? pantoprazole (PROTONIX) 40 MG tablet Take 1 tablet (40 mg total) by mouth daily.  ?  ? ?Allergies:   Aspirin and Sulfamethoxazole-trimethoprim  ? ?Social History  ? ?Socioeconomic History  ? Marital status: Widowed  ?  Spouse name: Not on file  ? Number of children: 3  ? Years of education: 81  ? Highest education level: Not on file  ?Occupational History  ? Occupation: Retired  ?  Tobacco Use  ? Smoking status: Former  ?  Packs/day: 0.10  ?  Years: 35.00  ?  Pack years: 3.50  ?  Types: Cigarettes  ?  Quit date: 04/08/2013  ?  Years since quitting: 8.8  ? Smokeless tobacco: Never  ? Tobacco comments:  ?  smoked a few when her son died in 01-14-13   ?Vaping Use  ? Vaping Use: Never used  ?Substance and Sexual Activity  ? Alcohol use: No  ?  Alcohol/week: 0.0 standard drinks  ? Drug use: No  ? Sexual activity: Never  ?Other Topics Concern  ? Not on file  ?Social History Narrative  ? Lives with daughter in [place]. Daughter is primary caretaker, surrogate decision-maker, and arranges for [facility] aide and other caregivers to supervise Ms. Forbush at home. Ambulatory  ? ?Social Determinants of Health  ? ?Financial Resource Strain: Not on file  ?Food Insecurity: Not on file  ?Transportation Needs: Not on file  ?Physical Activity: Not on file  ?Stress: Not on file  ?Social Connections: Not on file  ?  ? ?Family History: ?The patient's family history includes Diabetes in her maternal grandfather and mother; Hearing loss in her mother; Heart disease in her mother; Other in her brother; Post-traumatic stress disorder in her father; Schizophrenia in her maternal  grandmother; Stroke in her mother. There is no history of Kidney cancer or Bladder Cancer. ? ?ROS:   ?Please see the history of present illness.    ?Review of Systems  ?Constitutional:  Negative for chills and fe

## 2022-02-05 DIAGNOSIS — M109 Gout, unspecified: Secondary | ICD-10-CM | POA: Diagnosis not present

## 2022-02-05 DIAGNOSIS — I5032 Chronic diastolic (congestive) heart failure: Secondary | ICD-10-CM | POA: Diagnosis not present

## 2022-02-05 DIAGNOSIS — I110 Hypertensive heart disease with heart failure: Secondary | ICD-10-CM | POA: Diagnosis not present

## 2022-02-05 DIAGNOSIS — N390 Urinary tract infection, site not specified: Secondary | ICD-10-CM | POA: Diagnosis not present

## 2022-02-05 DIAGNOSIS — E785 Hyperlipidemia, unspecified: Secondary | ICD-10-CM | POA: Diagnosis not present

## 2022-02-05 DIAGNOSIS — K219 Gastro-esophageal reflux disease without esophagitis: Secondary | ICD-10-CM | POA: Diagnosis not present

## 2022-02-05 DIAGNOSIS — M1990 Unspecified osteoarthritis, unspecified site: Secondary | ICD-10-CM | POA: Diagnosis not present

## 2022-02-06 ENCOUNTER — Other Ambulatory Visit: Payer: Medicare Other

## 2022-02-06 ENCOUNTER — Telehealth: Payer: Self-pay

## 2022-02-06 ENCOUNTER — Other Ambulatory Visit: Payer: Self-pay | Admitting: *Deleted

## 2022-02-06 DIAGNOSIS — N390 Urinary tract infection, site not specified: Secondary | ICD-10-CM

## 2022-02-06 NOTE — Telephone Encounter (Signed)
Would you like me to place an order?  ?

## 2022-02-06 NOTE — Telephone Encounter (Signed)
Ok with me. Please place any necessary orders. 

## 2022-02-06 NOTE — Telephone Encounter (Signed)
Order placed, patient daughter notified  ?

## 2022-02-06 NOTE — Telephone Encounter (Signed)
Daughter is requesting call back.  Would like to drop off urine to be tested to make sure UTI has gone away.

## 2022-02-07 ENCOUNTER — Other Ambulatory Visit: Payer: Self-pay | Admitting: *Deleted

## 2022-02-07 LAB — URINE CULTURE
MICRO NUMBER:: 13241990
SPECIMEN QUALITY:: ADEQUATE

## 2022-02-07 MED ORDER — CARVEDILOL 3.125 MG PO TABS: 3.1250 mg | ORAL_TABLET | Freq: Two times a day (BID) | ORAL | 3 refills | Status: DC

## 2022-02-08 ENCOUNTER — Other Ambulatory Visit: Payer: Medicare Other

## 2022-02-08 ENCOUNTER — Telehealth: Payer: Self-pay | Admitting: Family Medicine

## 2022-02-08 DIAGNOSIS — Z515 Encounter for palliative care: Secondary | ICD-10-CM

## 2022-02-08 NOTE — Telephone Encounter (Signed)
See Result Notes.  

## 2022-02-08 NOTE — Progress Notes (Signed)
Please inform patient of the following: ? ?Good news! Urine culture is negative. ? ?Algis Greenhouse. [name], MD ?02/08/2022 9:20 AM  ?

## 2022-02-08 NOTE — Telephone Encounter (Signed)
Patient wants a call back from MA re urine results. She stated she had spoken to someone in the office yesterday re evaluation report.- Daughter would like a call back-  ?

## 2022-02-08 NOTE — Progress Notes (Signed)
COMMUNITY PALLIATIVE CARE SW NOTE ? ?PATIENT NAME: [name] ?DOB: 14-Jun-1941 ?MRN: [id number] ? ?PRIMARY CARE PROVIDER: Vivi Barrack, MD ? ?RESPONSIBLE PARTY:  ?Acct ID - Guarantor Home Phone Work Phone Relationship Acct Type  ?[id number] [name], [name]* [id number]  Self P/F  ?   [address], [place], [place] [address]  ? ?Due to the COVID-19 crisis, this virtual check-in visit was done via telephone from my office and it was initiated and consent by this patient and or family. ? ?PC SW completed a telephonic encounter with patient's daughter-[name] to provide support and resources to her. [name] advised that she works full-time (at home) and is patient's full-time caregiver. She is fatigued, have health issues of her own and have anxiety as she needs help. She explained that she does not have a support network in place to help her. Her goal is to keep patient at home. SW clarified patient's insurance and provided education on resources such as [facility], [employer] services and [employer]. SW offered to assist [name] with accessing those programs, which she was grateful for. SW provided supportive counseling, active listening, education, normalization of feelings and reassurance of support. ?No other concerns were noted.  ? ?Katheren Puller, LCSW ? ?

## 2022-02-09 ENCOUNTER — Ambulatory Visit (INDEPENDENT_AMBULATORY_CARE_PROVIDER_SITE_OTHER): Payer: Medicare Other | Admitting: Licensed Clinical Social Worker

## 2022-02-09 DIAGNOSIS — F03918 Unspecified dementia, unspecified severity, with other behavioral disturbance: Secondary | ICD-10-CM

## 2022-02-09 DIAGNOSIS — I10 Essential (primary) hypertension: Secondary | ICD-10-CM

## 2022-02-09 NOTE — Patient Instructions (Signed)
Visit Information ? ?Thank you for taking time to visit with me today. Please don't hesitate to contact me if I can be of assistance to you before our next scheduled telephone appointment. ? ?Following are the goals we discussed today: levels of care ?Task & activities for Caregiver to accomplish goals: ?Continue to work with [facility] for placement ?Continue with self care for yourself  ? ?Our next appointment is by telephone on May 18th at 3:00 ? ?Please call the care guide team at [phone] if you need to cancel or reschedule your appointment.  ? ?If you are experiencing a Mental Health or [place] or need someone to talk to, please call 1-800-273-TALK (toll free, 24 hour hotline)  ? ?Patient's daughter verbalizes understanding of instructions and care plan provided today and agrees to view in [place]. Active MyChart status confirmed with patient.   ? ?Casimer Lanius, LCSW ?Licensed Clinical Social Worker [employer]  ?[place] ?[phone]  ?

## 2022-02-09 NOTE — Chronic Care Management (AMB) (Signed)
?  Chronic Care Management  ? Clinical Social Work Note ? ?02/09/2022 ?Name: JEROLYN FLENNIKEN MRN: [id number] DOB: 31-Aug-1941 ? ?QUIARA KILLIAN is a 81 y.o. year old female who is a primary care patient of [name], Algis Greenhouse, MD. The CCM team was consulted to assist the patient with chronic disease management and/or care coordination needs related to: Level of Care Concerns.  ? ?Collaboration with patient's daughter  for follow up visit in response to provider referral for social work chronic care management and care coordination services.  ? ?Consent to Services:  ?The patient was given information about Chronic Care Management services, agreed to services, and gave verbal consent prior to initiation of services.  Please see initial visit note for detailed documentation.  ? ?Patient's daughter agreed to services and consent obtained.  ?Summary: Patient's daughter [name] provided all information during this encounter.Jackquline Denmark completed assessment for PT, notes will be sent for short term rehab at [facility].  Daughter reports having everything that she needs and is on track for placement.   See Care Plan below for interventions and patient self-care actives. ? ?Follow up Plan: Patient's caregiver would like continued follow-up from CCM LCSW .  per caregiver's request CCM LCSW will follow up in 30 days.  They will call the office if needed prior to next encounter. ?  ?Assessment: Review of patient past medical history, allergies, medications, and health status, including review of relevant consultants reports was performed today as part of a comprehensive evaluation and provision of chronic care management and care coordination services.    ? ?SDOH (Social Determinants of Health) assessments and interventions performed:   ? ?Advanced Directives Status: See [place] application for related entries. ? ?CCM Care Plan ?Conditions to be addressed/monitored: ; Level of care concerns ? ?Care Plan : LCSW  Care Plan  ?Updates made by Maurine Cane, LCSW since 02/09/2022 12:00 AM  ?  ? ?Problem: Mobility and Independence   ?  ? ?Goal: Mobility and Independence Optimized with rehab at skilled nursing   ?Start Date: 01/19/2022  ?This Visit's Progress: On track  ?Recent Progress: On track  ?Priority: High  ?Note:   ?Current Barriers:  ?Level of Care Concerns:Facility placement (for short term rehab and possible long-term care) ? ?CSW Clinical Goal(s): Identify and reduce risk for harm and injury; present options for unmet needs related to: Facility placement (for skilled needs and possible Long-term care) ? ?Interventions: ?1:1 collaboration with primary care provider regarding development and update of comprehensive plan of care as evidenced by provider attestation and co-signature ?Inter-disciplinary care team collaboration (see longitudinal plan of care) ?Evaluation of current treatment plan related to  self management and patient's adherence to plan as established by provider ? ?Level of Care Concerns in a patient with CHF and Dementia:  (Status: Goal on Track (progressing): YES.) ?Current level of care: home, alone ?Evaluation of patient safety in current living environment ?Evaluation of current treatment plan related to facility placement:  ?Solution-Focused Strategies employed:  ?Problem [place] strategies reviewed ? ?Task & activities to accomplish goals: ?Continue to work with [facility] for placement ?Continue with self care for yourself  ? ?  ? ?Casimer Lanius, LCSW ?Licensed Clinical Social Worker [employer] [employer]  ?[place] ?[phone]  ? ? ?

## 2022-02-10 ENCOUNTER — Telehealth: Payer: Self-pay | Admitting: Family Medicine

## 2022-02-10 NOTE — Telephone Encounter (Signed)
Spoke with pt daughter [name] she stated patient will move to a health facility in May ?

## 2022-02-13 ENCOUNTER — Other Ambulatory Visit: Payer: Medicare Other

## 2022-02-13 DIAGNOSIS — Z515 Encounter for palliative care: Secondary | ICD-10-CM

## 2022-02-13 NOTE — Progress Notes (Signed)
COMMUNITY PALLIATIVE CARE SW NOTE ? ?PATIENT NAME: [name] ?DOB: Apr 10, 1941 ?MRN: [id number] ? ?PRIMARY CARE PROVIDER: Vivi Barrack, MD ? ?RESPONSIBLE PARTY:  ?Acct ID - Guarantor Home Phone Work Phone Relationship Acct Type  ?[id number] [name], SCHUERMANN* [phone]  Self P/F  ?   [address] [place], [place] [address]  ? ?Due to the COVID-19 crisis, this virtual check-in visit was done via telephone from my office and it was initiated and consent by this patient and or family. ? ?PC SW completed a telephonic encounter with patient's daughter-[name] to extend support to her and assess goals. [name] advised that she is still trying to assess resources SW provided as well. She is working with CCM SW to get patient a bed at [facility] for therapy. SW explained to [place] that PCS application has been completed and needs to be sent to PCP, but if she is pursuing rehab, then the PCS may need to be put on hold. [name] advised that SW could hold it until patient is discharged back home. [name] stated that she will keep SW updated on progress made toward placement.  ? ?Katheren Puller, LCSW ? ?

## 2022-02-14 DIAGNOSIS — I5032 Chronic diastolic (congestive) heart failure: Secondary | ICD-10-CM | POA: Diagnosis not present

## 2022-02-14 DIAGNOSIS — E785 Hyperlipidemia, unspecified: Secondary | ICD-10-CM | POA: Diagnosis not present

## 2022-02-14 DIAGNOSIS — F209 Schizophrenia, unspecified: Secondary | ICD-10-CM | POA: Diagnosis not present

## 2022-02-14 DIAGNOSIS — I110 Hypertensive heart disease with heart failure: Secondary | ICD-10-CM | POA: Diagnosis not present

## 2022-02-14 DIAGNOSIS — G40909 Epilepsy, unspecified, not intractable, without status epilepticus: Secondary | ICD-10-CM | POA: Diagnosis not present

## 2022-02-14 DIAGNOSIS — N390 Urinary tract infection, site not specified: Secondary | ICD-10-CM | POA: Diagnosis not present

## 2022-02-14 DIAGNOSIS — M1990 Unspecified osteoarthritis, unspecified site: Secondary | ICD-10-CM | POA: Diagnosis not present

## 2022-02-14 DIAGNOSIS — F0390 Unspecified dementia without behavioral disturbance: Secondary | ICD-10-CM

## 2022-02-14 DIAGNOSIS — K219 Gastro-esophageal reflux disease without esophagitis: Secondary | ICD-10-CM | POA: Diagnosis not present

## 2022-02-14 DIAGNOSIS — M109 Gout, unspecified: Secondary | ICD-10-CM | POA: Diagnosis not present

## 2022-02-15 ENCOUNTER — Ambulatory Visit: Payer: Self-pay | Admitting: Licensed Clinical Social Worker

## 2022-02-15 ENCOUNTER — Telehealth: Payer: Self-pay | Admitting: Family Medicine

## 2022-02-15 DIAGNOSIS — Z7189 Other specified counseling: Secondary | ICD-10-CM

## 2022-02-15 DIAGNOSIS — I10 Essential (primary) hypertension: Secondary | ICD-10-CM

## 2022-02-15 DIAGNOSIS — F03918 Unspecified dementia, unspecified severity, with other behavioral disturbance: Secondary | ICD-10-CM

## 2022-02-15 NOTE — Chronic Care Management (AMB) (Signed)
?  Chronic Care Management  ? Clinical Social Work Note ? ?02/15/2022 ?Name: NADEEN SHIPMAN MRN: [id number] DOB: 01/04/1941 ? ?ATHEENA SPANO is a 81 y.o. year old female who is a primary care patient of [name], Algis Greenhouse, MD. The CCM team was consulted to assist the patient with chronic disease management and/or care coordination needs related to: Level of Care Concerns.  ? ?Collaboration with patient's daughter  for follow up visit in response to provider referral for social work chronic care management and care coordination services.  ? ?Consent to Services:  ?The patient was given information about Chronic Care Management services, agreed to services, and gave verbal consent prior to initiation of services.  Please see initial visit note for detailed documentation.  ? ?Patient's daughter agreed to services and consent obtained.  ? ?Summary:  incoming call from patient's daughter [name], she continues to experience barriers with getting patient placed into facility for rehab. Per daughter Jackquline Denmark has completed the PT evaluation but will not send it to [facility] . Daughter is requesting LCSW to collaborate with PCP to fax the PT notes to  Att:  Deirdre Pippins   phone # [phone]   Fax #: [phone].  LCSW will provide update to PCP. See Care Plan below for interventions and patient self-care actives.  ? ?Follow up Plan:  F/u phone appointment scheduled with LCSW in May. ?  ?Assessment: Review of patient past medical history, allergies, medications, and health status, including review of relevant consultants reports was performed today as part of a comprehensive evaluation and provision of chronic care management and care coordination services.    ? ?SDOH (Social Determinants of Health) assessments and interventions performed:   ? ?Advanced Directives Status: Not addressed in this encounter. ? ?CCM Care Plan ?Conditions to be addressed/monitored: Dementia; Level of care concerns ? ?Care Plan :  LCSW Care Plan  ?Updates made by Maurine Cane, LCSW since 02/15/2022 12:00 AM  ?  ? ?Problem: Mobility and Independence   ?  ? ?Goal: Mobility and Independence Optimized with rehab at skilled nursing   ?Start Date: 01/19/2022  ?Recent Progress: On track  ?Priority: High  ?Note:   ?Current Barriers:  ?Level of Care Concerns:Facility placement (for short term rehab and possible long-term care) ?Difficult with getting PT evaluation to facility ? ?CSW Clinical Goal(s): Identify and reduce risk for harm and injury; present options for unmet needs related to: Facility placement (for skilled needs and possible Long-term care) ? ?Interventions: ?1:1 collaboration with primary care provider regarding development and update of comprehensive plan of care as evidenced by provider attestation and co-signature ?Inter-disciplinary care team collaboration (see longitudinal plan of care) ?Evaluation of current treatment plan related to  self management and patient's adherence to plan as established by provider ? ?Level of Care Concerns in a patient with CHF and Dementia:  (Status: Goal on Track (progressing): YES.) ?Current level of care: home, alone ?Evaluation of patient safety in current living environment ?Evaluation of current treatment plan related to facility placement:  ?Solution-Focused Strategies employed:  ?Problem [place] strategies reviewed ?Collaborated with PCP for needed information ? ?Task & activities to accomplish goals: ?Continue to work with [facility] for placement ?Continue with self care for yourself  ? ?  ? ?Casimer Lanius, LCSW ?Licensed Clinical Social Worker [employer]  ?[place] ?[phone]  ? ?

## 2022-02-15 NOTE — Patient Instructions (Addendum)
Visit Information ? ?Thank you for taking time to visit with me today. Please don't hesitate to contact me if I can be of assistance to you before our next scheduled telephone appointment. ? ?Following are the goals we discussed today:  ?Task & activities to accomplish goals: ?Per your request I have reached out to Dr. [name] about  the PT notes ?Continue to work with [facility] for placement ?Continue with self care for yourself  ?  ?Our next appointment is by telephone on May 18th at 2:00 ? ?Please call the care guide team at [phone] if you need to cancel or reschedule your appointment.  ? ?If you are experiencing a Mental Health or [place] or need someone to talk to, please call 1-800-273-TALK (toll free, 24 hour hotline) ?call 911  ? ?Patient verbalizes understanding of instructions and care plan provided today and agrees to view in [place]. Active MyChart status confirmed with patient.   ? ?Casimer Lanius, LCSW ?Licensed Clinical Social Worker [employer]  ?[place] ?[phone]  ?

## 2022-02-15 NOTE — Telephone Encounter (Signed)
Spoke with patient, stated send power of attorney information to patient PT for notes to be sent to skill nursing facility  ?Will give [place] a call back if any other records needed  ?

## 2022-02-15 NOTE — Telephone Encounter (Signed)
Pt's daughter states the physical therapist agency that works with her mother states they cannot fax the progress notes or any information regarding pt to the nursing home due to [facility] requirements. She states there should not be an issue, she has signed all the neccessary paperwork. She is asking if our office has the report from the PT agency and if we can send it to the nursing home. Please advise ?

## 2022-02-16 ENCOUNTER — Other Ambulatory Visit: Payer: Self-pay | Admitting: Family Medicine

## 2022-02-17 DIAGNOSIS — I509 Heart failure, unspecified: Secondary | ICD-10-CM | POA: Diagnosis not present

## 2022-02-17 DIAGNOSIS — I10 Essential (primary) hypertension: Secondary | ICD-10-CM | POA: Diagnosis not present

## 2022-02-21 ENCOUNTER — Telehealth: Payer: Self-pay | Admitting: Family Medicine

## 2022-02-21 DIAGNOSIS — N390 Urinary tract infection, site not specified: Secondary | ICD-10-CM | POA: Diagnosis not present

## 2022-02-21 DIAGNOSIS — I5032 Chronic diastolic (congestive) heart failure: Secondary | ICD-10-CM | POA: Diagnosis not present

## 2022-02-21 DIAGNOSIS — M109 Gout, unspecified: Secondary | ICD-10-CM | POA: Diagnosis not present

## 2022-02-21 DIAGNOSIS — M1990 Unspecified osteoarthritis, unspecified site: Secondary | ICD-10-CM | POA: Diagnosis not present

## 2022-02-21 DIAGNOSIS — I110 Hypertensive heart disease with heart failure: Secondary | ICD-10-CM | POA: Diagnosis not present

## 2022-02-21 DIAGNOSIS — K219 Gastro-esophageal reflux disease without esophagitis: Secondary | ICD-10-CM | POA: Diagnosis not present

## 2022-02-21 DIAGNOSIS — E785 Hyperlipidemia, unspecified: Secondary | ICD-10-CM | POA: Diagnosis not present

## 2022-02-21 NOTE — Telephone Encounter (Signed)
Tillie Rung from [facility] called re patient-  ? ?Stated patient is having a junky non productive cough - stated her lungs are abnormal upper and lower. -  ? ?Stated patients oxygen level is 92 at rest -  ? ?Stated daughter would like to repeat urine anaclisis for UTI.. ? ? ?Patient is also constipated- has not had a bowel movement since 5 days -   ? ?Requesting:  ? ?Chest x ray  ? ? ?Please call Tillie Rung at [phone].  ?

## 2022-02-22 ENCOUNTER — Telehealth: Payer: Self-pay | Admitting: Family Medicine

## 2022-02-22 NOTE — Telephone Encounter (Signed)
Daughter called stating we need to fax a new FL2 form to [facility] as the last FL2 expired on 01/20/22- Stated they just got PT EVAL and updated FL2 is needed. ? ?Stated form can be faxed to [phone] at [place]-  ?

## 2022-02-22 NOTE — Telephone Encounter (Signed)
Daughter called again asking if we had given the message to Dr. [name]. I advised she called after 12 noon and Dr. [name] would not see the message until tomorrow.  She was satisfied and said waiting until tomorrow would not be a problem.  ?

## 2022-02-22 NOTE — Telephone Encounter (Signed)
Please see note and advise  

## 2022-02-23 NOTE — Telephone Encounter (Signed)
Daughter has called back in regard for an update.    I have transferred patient to [facility].

## 2022-02-23 NOTE — Telephone Encounter (Signed)
Daughter, [name] has called in again.  States if [facility] does not receive forms today.  The office will have to start authorization process all over again.  Is requesting call back asap at [phone].

## 2022-02-23 NOTE — Telephone Encounter (Signed)
I have left VM for daughter to call back to schedule appt.    I have also spoken with Tillie Rung.   She was concerned about patients cough.    She is going to text patients daughter as well to see if we can get patient scheduled for in office visit tomorrow.

## 2022-02-23 NOTE — Telephone Encounter (Signed)
Pt's daughter does not have any other conflicts and should be available when you call back. ?

## 2022-02-23 NOTE — Telephone Encounter (Signed)
Please schedule appt.  thanks

## 2022-02-23 NOTE — Telephone Encounter (Signed)
Daughter called this morning wanting to make sure this was taken care of and to make sure [name] knows this is an updated form.  ?

## 2022-02-23 NOTE — Telephone Encounter (Signed)
FL2 Faxed to [phone] ?

## 2022-02-23 NOTE — Telephone Encounter (Signed)
Recommend they come in for an office visit if they are able to. ? ?[name]. [name], MD ?02/23/2022 8:31 AM  ? ?

## 2022-02-24 NOTE — Telephone Encounter (Signed)
Spoke with patient on 02/23/2022 patient aware FL2 form was faxed  ?A new version was done and faxed as well  ?

## 2022-02-26 DIAGNOSIS — I10 Essential (primary) hypertension: Secondary | ICD-10-CM

## 2022-02-26 DIAGNOSIS — F03918 Unspecified dementia, unspecified severity, with other behavioral disturbance: Secondary | ICD-10-CM

## 2022-03-01 ENCOUNTER — Telehealth: Payer: Self-pay | Admitting: *Deleted

## 2022-03-01 NOTE — Telephone Encounter (Signed)
Left message to return call to our office at their convenience.  

## 2022-03-01 NOTE — Telephone Encounter (Signed)
PASSAR requesting a note form Dr [name] stating that the skilled nursing request is for a short term Rehab stay for day stay for days or less in skilled nursing  ?Letter may be faxed back to [facility] [phone]  ?Ok to write letter? Please advise how many days  ?

## 2022-03-01 NOTE — Telephone Encounter (Signed)
What is PASSAR? Can we contact patient's daughter to see what they need? ? ?Algis Greenhouse. [name], MD ?03/01/2022 9:26 AM  ? ?

## 2022-03-02 ENCOUNTER — Encounter: Payer: Self-pay | Admitting: Family Medicine

## 2022-03-02 NOTE — Telephone Encounter (Signed)
Letter sent by fax and email to [facility] Admissions Coordinator as requested.  ?

## 2022-03-02 NOTE — Telephone Encounter (Signed)
Daughter called back to give pt's SSN to [facility] "as requested". ? ?CSR will walk back to team, then shred the information. ?

## 2022-03-06 ENCOUNTER — Telehealth: Payer: Self-pay | Admitting: Family Medicine

## 2022-03-06 ENCOUNTER — Other Ambulatory Visit: Payer: Self-pay

## 2022-03-06 ENCOUNTER — Other Ambulatory Visit: Payer: Medicare Other | Admitting: Hospice

## 2022-03-06 DIAGNOSIS — F339 Major depressive disorder, recurrent, unspecified: Secondary | ICD-10-CM

## 2022-03-06 DIAGNOSIS — G40909 Epilepsy, unspecified, not intractable, without status epilepticus: Secondary | ICD-10-CM

## 2022-03-06 DIAGNOSIS — R269 Unspecified abnormalities of gait and mobility: Secondary | ICD-10-CM | POA: Diagnosis not present

## 2022-03-06 DIAGNOSIS — F03918 Unspecified dementia, unspecified severity, with other behavioral disturbance: Secondary | ICD-10-CM

## 2022-03-06 DIAGNOSIS — Z515 Encounter for palliative care: Secondary | ICD-10-CM | POA: Diagnosis not present

## 2022-03-06 DIAGNOSIS — M109 Gout, unspecified: Secondary | ICD-10-CM | POA: Diagnosis not present

## 2022-03-06 DIAGNOSIS — I5032 Chronic diastolic (congestive) heart failure: Secondary | ICD-10-CM | POA: Diagnosis not present

## 2022-03-06 DIAGNOSIS — K219 Gastro-esophageal reflux disease without esophagitis: Secondary | ICD-10-CM | POA: Diagnosis not present

## 2022-03-06 DIAGNOSIS — N390 Urinary tract infection, site not specified: Secondary | ICD-10-CM | POA: Diagnosis not present

## 2022-03-06 DIAGNOSIS — M1990 Unspecified osteoarthritis, unspecified site: Secondary | ICD-10-CM | POA: Diagnosis not present

## 2022-03-06 DIAGNOSIS — I110 Hypertensive heart disease with heart failure: Secondary | ICD-10-CM | POA: Diagnosis not present

## 2022-03-06 DIAGNOSIS — E785 Hyperlipidemia, unspecified: Secondary | ICD-10-CM | POA: Diagnosis not present

## 2022-03-06 NOTE — Telephone Encounter (Signed)
[name] called asking to speak to [name] was advised [name] is not in office today she asked to speak to [name] stated there is a faxing coming through re peer to peer with dr [name] for [name]. [name] stated it is time sensitive.   ?

## 2022-03-06 NOTE — Telephone Encounter (Signed)
Called [place] and spoke with [place]; She advised [place] PASSAR number has been obtained as of 03-07-22. ?She submitted for [facility] approval on 5-5 and received a call today that they would like Dr. [name] to complete a PEER to PEER review by calling [phone] option 5. (case ID [id number]) this has to be done by 10am 03-07-2022. ?Please let me know if this is going to be attempted otherwise I not be able to move forward with this admission. ?

## 2022-03-06 NOTE — Telephone Encounter (Signed)
Dr [name] completed peer to peer 03/06/22 3:45pm  ?

## 2022-03-06 NOTE — Progress Notes (Signed)
? ? ?Manufacturing engineer ?Community Palliative Care Consult Note ?Telephone: [phone]  ?Fax: [phone] ? ?PATIENT NAME: [name] ?[place] [address] ?[phone] (home)  ?DOB: 1941/08/08 ?MRN: [id number] ? ?PRIMARY CARE PROVIDER:    ?Vivi Barrack, MD,  ?[place] ?[place] [address] ?[phone] ? ?REFERRING PROVIDER:   ?Vivi Barrack, MD ?[place] ?[place],  [place] [address] ?[phone] ? ?RESPONSIBLE PARTY:   Daughter - [name] - [place] [phone] ?[name] works remote with [employer].  ?Contact Information   ? ? Name Relation Home Work Mobile  ? Poole,[name] Daughter [phone]    ? Osa Craver Son [phone]    ? ?  ? ? ?TELEHEALTH VISIT STATEMENT ?Due to the COVID-19 crisis, this visit was done via telemedicine from my office and it was initiated and consent by this patient and or family.  ?I connected with patient OR PROXY by a telephone/video  and verified that I am speaking with the correct person. I discussed the limitations of evaluation and management by telemedicine. The patient/proxy expressed understanding and agreed to proceed. Patient is at home with [facility] during visit.  ?Palliative Care was asked to follow this patient to address advance care planning, complex medical decision making and goals of care clarification. This is a f/u visit.  ? ?ASSESSMENT AND / RECOMMENDATIONS:  ? ?CODE STATUS: She is a full code ? ?Goals of Care: Goals include to maximize quality of life and symptom management. ?[name] expressed distress in trying to get long-term care placement and all PCS for patient.  [facility] social worker was following and secured and submitted PCS for patient which was pended when plan changed for patient to go to [facility].  That did not materialize.  [name] will call [facility] social worker today to inform and revisit PCS.   ?Visit consisted of counseling and education dealing with the complex and emotionally intense issues  of symptom management and palliative care in the setting of serious and potentially life-threatening illness. Palliative care team will continue to support patient, patient's family, and medical team. ? ?Symptom Management/Plan: stress with [place] working.  ?Dementia: progressive Memory/confusion in line with Dementia disease trajectory. Continue Donepezil and Olanzapine as ordered.  ?Incontinent of bladder FAST 6C, ambulatory with rolling walker.  Continue to encourage reminiscence, word search/puzzles, cueing for recollection.  Promote calm approach and engaging environment.  Optimize ongoing supportive care.  Continue with Neurologist and Psychiatrist as planned. Routine CBC CMP ?Seizure: No seizure since last visit. Managed with Lamotrigene. Seizure precautions ? ?Depression: Managed with Bupropion.  Encourage socialization and participation in activities of choice.  Psych consult as needed ? ?Gait disturbance: PT/OT is in process for gait training and strengthening.  ? ?CHF: Not currently on diuretic. Continue Carvedilol, Losartan and Amlodipine to control blood pressure. Elevate BLE during the day as much as possible to promote circulation. Adhere to Fluid and salt limits. Monitor weight closely and report weight gain of 2 Ibs in a day or 5 Ibs in a week.  ? ? ?Follow up: Palliative care will continue to follow for complex medical decision making, advance care planning, and clarification of goals. Return 6 weeks or prn. Encouraged to call provider sooner with any concerns.  ? ?Family /Caregiver/Community Supports: Patient lives at home with [place].  Strong family support system identified.  ? ?HOSPICE ELIGIBILITY/DIAGNOSIS: TBD ? ?Chief Complaint: f/u visit ? ?HISTORY OF PRESENT ILLNESS:  WENDELL FIEBIG is a 81 y.o. year old female  with multiple morbidities requiring close monitoring and with high risk of complications and  mortality: Dementia, seizure disorder, diastolic congestive heart failure, CKD 3, gait  disturbance.  History of Depression and Schizophrenia. History obtained from review of EMR, discussion with primary team, caregiver, family and/or [name].  ?I reviewed as needed, available labs, patient records, imaging, studies and related documents from the EMR. ? ? ?PAST MEDICAL HISTORY:  ?Active Ambulatory Problems  ?  Diagnosis Date Noted  ? Hyperlipidemia 09/03/2006  ? Gout 09/03/2006  ? Obesity, Class II, BMI 35-39.9, with comorbidity 05/14/2009  ? Normocytic anemia 09/25/2007  ? Essential hypertension 09/03/2006  ? Chronic diastolic heart failure ([place]) 05/06/2010  ? Allergic rhinitis 10/14/2009  ? GERD 09/03/2006  ? Chronic prescription opiate use 04/29/2008  ? Postmenopausal atrophic vaginitis 11/01/2010  ? Schizophrenia, chronic condition ([place]) 01/20/2012  ? Chronic kidney disease (CKD), stage III (moderate) ([place]) 10/04/2012  ? Reactive airway disease 08/11/2014  ? History of cancer 08/11/2014  ? OSA (obstructive sleep apnea) 08/11/2014  ? Atopic dermatitis 07/09/2015  ? History of lumbar laminectomy for spinal cord decompression 02/02/2016  ? History of bilateral hip replacements   ? Lower extremity edema 07/31/2017  ? Osteoarthritis 01/31/2018  ? Constipation 04/30/2018  ? Hyperglycemia 05/06/2018  ? Venous stasis of lower extremity 10/14/2019  ? Debility 11/06/2019  ? Dementia ([place]) 03/26/2021  ? Seizure disorder ([place]) 03/29/2021  ? ?Resolved Ambulatory Problems  ?  Diagnosis Date Noted  ? Unspecified visual loss 04/13/2009  ? VENOUS INSUFFICIENCY 09/03/2006  ? INTERTRIGO, CANDIDAL 03/13/2008  ? [place] DISEASE 09/03/2006  ? LEG EDEMA, BILATERAL 06/19/2008  ? DYSPNEA ON EXERTION 03/11/2010  ? SHIN SPLINTS 08/26/2010  ? SPRAIN AND STRAIN OF CALCANEOFIBULAR 10/05/2010  ? Personal history of unspecified circulatory disease 09/03/2006  ? UPPER RESPIRATORY INFECTION, VIRAL 11/01/2010  ? Dysuria 11/28/2007  ? Hx-TIA (transient ischemic attack) 05/13/2001  ? Mitral valve prolapse 10/27/1999  ?  Dysuria 06/22/2011  ? Cough 06/22/2011  ? Cough 06/23/2011  ? Night sweats 10/04/2012  ? Menopausal symptom 11/11/2012  ? Acute upper respiratory infections of unspecified site 12/20/2012  ? Healthcare maintenance 07/17/2013  ? UTI (urinary tract infection) 07/28/2013  ? Abnormal blood findings 08/13/2014  ? Back pain at L4-L5 level 05/03/2016  ? Memory disorder 12/20/2016  ? SDH (subdural hematoma) ([place]) 02/10/2017  ? TBI (traumatic brain injury) ([place])   ? Subdural hemorrhage following injury without open intracranial wound and with loss of consciousness of 30 minutes or less, sequela ([place]) 03/08/2017  ? Frequent urination 07/31/2017  ? Diarrhea 11/20/2017  ? Uncomplicated opioid dependence ([facility]) 01/31/2018  ? Dyslipidemia 04/30/2018  ? [place] 08/01/2018  ? Pain of right thumb 01/06/2019  ? Cellulitis of right breast 06/13/2019  ? Acute CHF (congestive heart failure) ([place]) 09/22/2020  ? Acute on chronic diastolic CHF (congestive heart failure) ([place]) 09/22/2020  ? Acute on chronic diastolic CHF (congestive heart failure) ([place]) 03/26/2021  ? TIA (transient ischemic attack) 03/26/2021  ? Obesity, Class III, BMI 40-49.9 (morbid obesity) ([place]) 03/26/2021  ? ?Past Medical History:  ?Diagnosis Date  ? CHF (congestive heart failure) ([place])   ? Chronic venous insufficiency   ? Colon, diverticulosis 06/2010  ? DDD (degenerative disc disease), lumbosacral   ? Depression   ? DJD (degenerative joint disease), multiple sites   ? GERD (gastroesophageal reflux disease)   ? Gout   ? Gout   ? History of cervical cancer 1972  ? Hypertension   ? Osteoarthritis of  hip   ? Ovarian cancer ([place]) 01-14-71  ? Psychosis ([place])   ? Recurrent boils   ? Seizures ([place])   ? Sensorineural hearing loss of both ears   ? Subdural hematoma ([place]) 01/26/2017  ? ? ?SOCIAL HX:  ?Social History  ? ?Tobacco Use  ? Smoking status: Former  ?  Packs/day: 0.10  ?  Years: 35.00  ?  Pack years: 3.50  ?  Types: Cigarettes  ?  Quit date: 04/08/2013  ?  Years since  quitting: 8.9  ? Smokeless tobacco: Never  ? Tobacco comments:  ?  smoked a few when her son died in 13-Jan-2013   ?Substance Use Topics  ? Alcohol use: No  ?  Alcohol/week: 0.0 standard drinks  ? ?  ?FAMILY HX:

## 2022-03-08 DIAGNOSIS — K219 Gastro-esophageal reflux disease without esophagitis: Secondary | ICD-10-CM | POA: Diagnosis not present

## 2022-03-08 DIAGNOSIS — R569 Unspecified convulsions: Secondary | ICD-10-CM | POA: Diagnosis not present

## 2022-03-08 DIAGNOSIS — E119 Type 2 diabetes mellitus without complications: Secondary | ICD-10-CM | POA: Diagnosis not present

## 2022-03-08 DIAGNOSIS — N390 Urinary tract infection, site not specified: Secondary | ICD-10-CM | POA: Diagnosis not present

## 2022-03-08 DIAGNOSIS — E559 Vitamin D deficiency, unspecified: Secondary | ICD-10-CM | POA: Diagnosis not present

## 2022-03-08 DIAGNOSIS — N183 Chronic kidney disease, stage 3 unspecified: Secondary | ICD-10-CM | POA: Diagnosis not present

## 2022-03-08 DIAGNOSIS — F32A Depression, unspecified: Secondary | ICD-10-CM | POA: Diagnosis not present

## 2022-03-08 DIAGNOSIS — M5136 Other intervertebral disc degeneration, lumbar region: Secondary | ICD-10-CM | POA: Diagnosis not present

## 2022-03-08 DIAGNOSIS — I5032 Chronic diastolic (congestive) heart failure: Secondary | ICD-10-CM | POA: Diagnosis not present

## 2022-03-08 DIAGNOSIS — I10 Essential (primary) hypertension: Secondary | ICD-10-CM | POA: Diagnosis not present

## 2022-03-11 ENCOUNTER — Other Ambulatory Visit: Payer: Self-pay | Admitting: Family Medicine

## 2022-03-13 ENCOUNTER — Telehealth: Payer: Self-pay | Admitting: Family Medicine

## 2022-03-13 NOTE — Telephone Encounter (Signed)
Spoke with patient daughter, gave information for [facility]  ?

## 2022-03-13 NOTE — Telephone Encounter (Signed)
Pt's daughter was inquiring about an old referral for a lift chair from 08/15/21. She is wanting to know the name of the company that this referral was sent to.  ?

## 2022-03-15 DIAGNOSIS — R6889 Other general symptoms and signs: Secondary | ICD-10-CM | POA: Diagnosis not present

## 2022-03-16 ENCOUNTER — Ambulatory Visit (INDEPENDENT_AMBULATORY_CARE_PROVIDER_SITE_OTHER): Payer: Medicare Other | Admitting: Licensed Clinical Social Worker

## 2022-03-16 DIAGNOSIS — Z7189 Other specified counseling: Secondary | ICD-10-CM

## 2022-03-16 DIAGNOSIS — Z636 Dependent relative needing care at home: Secondary | ICD-10-CM

## 2022-03-16 DIAGNOSIS — I10 Essential (primary) hypertension: Secondary | ICD-10-CM

## 2022-03-16 DIAGNOSIS — F03918 Unspecified dementia, unspecified severity, with other behavioral disturbance: Secondary | ICD-10-CM

## 2022-03-16 NOTE — Chronic Care Management (AMB) (Signed)
  Chronic Care Management   Clinical Social Work Note  03/16/2022 Name: [name] MRN: [id number] DOB: 08/07/1941  [name] is a 81 y.o. year old female who is a primary care patient of [name], Algis Greenhouse, MD. The CCM team was consulted to assist the patient with chronic disease management and/or care coordination needs related to: Level of Care Concerns.   Collaboration with patient's daughter  for follow up visit in response to provider referral for social work chronic care management and care coordination services.   Consent to Services:  The patient was given information about Chronic Care Management services, agreed to services, and gave verbal consent prior to initiation of services.  Please see initial visit note for detailed documentation.   Patient's daughter agreed to services and consent obtained.   Summary: Patient's daughter [name] provided all information during this encounter. Reports patient went to rehab for a few days but it did not work out.  She is not longer considering long-term care.  Patient will remain home with her. Daughter will continue to collaborate with Palliative care for in-home support options. No new needs identified today for patient .  See Care Plan below for interventions and patient self-care actives.  Follow up Plan:  Caregiver does not require continued follow-up by CCM LCSW. Will contact the office if needed No follow up scheduled with CCM LCSW at this time. Will follow up in 90 days if no needs are identified in 90 day will disconnect from care team.    Assessment: Review of patient past medical history, allergies, medications, and health status, including review of relevant consultants reports was performed today as part of a comprehensive evaluation and provision of chronic care management and care coordination services.     SDOH (Social Determinants of Health) assessments and interventions performed:    Advanced Directives Status: Not  addressed in this encounter.  CCM Care Plan Conditions to be addressed/monitored: Dementia;   Care Plan : LCSW Care Plan  Updates made by Maurine Cane, LCSW since 03/16/2022 12:00 AM     Problem: Mobility and Independence      Goal: Mobility and Independence Optimized with rehab at skilled nursing Completed 03/16/2022  Start Date: 01/19/2022  This Visit's Progress: On track  Recent Progress: On track  Priority: High  Note:   Current Barriers:  Level of Care Concerns:Facility placement (for short term rehab and possible long-term care) Difficult with getting PT evaluation to facility  CSW Clinical Goal(s): Identify and reduce risk for harm and injury; present options for unmet needs related to: Facility placement (for skilled needs and possible Long-term care)  Interventions: 1:1 collaboration with primary care provider regarding development and update of comprehensive plan of care as evidenced by provider attestation and co-signature Inter-disciplinary care team collaboration (see longitudinal plan of care) Evaluation of current treatment plan related to  self management and patient's adherence to plan as established by provider  Level of Care Concerns in a patient with CHF and Dementia:  (Status: Goal Met. Patient declined further engagement on this goal.) Current level of care: home, alone Evaluation of patient safety in current living environment Evaluation of current treatment plan related to facility placement:   Task & activities to accomplish goals: Continue working with Palliative Care contact me if I can assist  Continue with self care for yourself     Casimer Lanius, LCSW Licensed Clinical Social Worker [facility] Management  [facility] [place] [phone]

## 2022-03-16 NOTE — Patient Instructions (Signed)
Visit Information  Thank you for taking time to visit with me today. Please don't hesitate to contact me if I can be of assistance to you before our next scheduled telephone appointment.  Following are the goals we discussed today: Long-term Care; You have decide not to move forward with this goal Task & activities : Continue working with Palliative Care contact me if I can assist  Continue with self care for yourself     No follow up scheduled, per our conversation you do not require continued follow up Per our conversation I will remain part of your care team for the next 90 days.  If no needs are identified in the next 90 days, I will disconnect from the care team. Casimer Lanius, LCSW Licensed Clinical Social Worker [facility] Management  [place] [phone]   Please call the care guide team at [phone] if you need to cancel or reschedule your appointment.   If you are experiencing a Mental Health or [place] or need someone to talk to, please call the [place] National Suicide Prevention Lifeline: (719)537-4884 or TTY: (724) 867-8607 TTY 662-774-7069) to talk to a trained counselor call 1-800-273-TALK (toll free, 24 hour hotline)   Patient verbalizes understanding of instructions and care plan provided today and agrees to view in [place]. Active MyChart status and patient understanding of how to access instructions and care plan via MyChart confirmed with patient.

## 2022-03-19 DIAGNOSIS — I509 Heart failure, unspecified: Secondary | ICD-10-CM | POA: Diagnosis not present

## 2022-03-19 DIAGNOSIS — I10 Essential (primary) hypertension: Secondary | ICD-10-CM | POA: Diagnosis not present

## 2022-03-20 DIAGNOSIS — N390 Urinary tract infection, site not specified: Secondary | ICD-10-CM | POA: Diagnosis not present

## 2022-03-20 DIAGNOSIS — I5032 Chronic diastolic (congestive) heart failure: Secondary | ICD-10-CM | POA: Diagnosis not present

## 2022-03-20 DIAGNOSIS — I110 Hypertensive heart disease with heart failure: Secondary | ICD-10-CM | POA: Diagnosis not present

## 2022-03-20 DIAGNOSIS — M1990 Unspecified osteoarthritis, unspecified site: Secondary | ICD-10-CM | POA: Diagnosis not present

## 2022-03-20 DIAGNOSIS — M109 Gout, unspecified: Secondary | ICD-10-CM | POA: Diagnosis not present

## 2022-03-20 DIAGNOSIS — K219 Gastro-esophageal reflux disease without esophagitis: Secondary | ICD-10-CM | POA: Diagnosis not present

## 2022-03-20 DIAGNOSIS — E785 Hyperlipidemia, unspecified: Secondary | ICD-10-CM | POA: Diagnosis not present

## 2022-03-21 ENCOUNTER — Ambulatory Visit: Payer: Self-pay | Admitting: Licensed Clinical Social Worker

## 2022-03-21 ENCOUNTER — Other Ambulatory Visit: Payer: Medicare Other

## 2022-03-21 DIAGNOSIS — F03918 Unspecified dementia, unspecified severity, with other behavioral disturbance: Secondary | ICD-10-CM

## 2022-03-21 DIAGNOSIS — M1990 Unspecified osteoarthritis, unspecified site: Secondary | ICD-10-CM

## 2022-03-21 DIAGNOSIS — R54 Age-related physical debility: Secondary | ICD-10-CM | POA: Diagnosis not present

## 2022-03-21 DIAGNOSIS — Z741 Need for assistance with personal care: Secondary | ICD-10-CM

## 2022-03-21 DIAGNOSIS — Z515 Encounter for palliative care: Secondary | ICD-10-CM

## 2022-03-21 DIAGNOSIS — I110 Hypertensive heart disease with heart failure: Secondary | ICD-10-CM | POA: Diagnosis not present

## 2022-03-21 DIAGNOSIS — E785 Hyperlipidemia, unspecified: Secondary | ICD-10-CM | POA: Diagnosis not present

## 2022-03-21 NOTE — Chronic Care Management (AMB) (Signed)
  Care Management  Collaboration  Note  03/21/2022 Name: MAKAIA RAPPA MRN: [id number] DOB: 18-Jan-1941  AUGUSTA MIRKIN is a 81 y.o. year old female who is a primary care patient of [name], Algis Greenhouse, MD. The CCM team was consulted reference care coordination needs for Level of Care Concerns.  Assessment: Patient was not interviewed or contacted during this encounter. Discussed possible level of care options to assist with meeting patient's needs.  All community options including private pay have been explored.   Intervention:Conducted brief assessment, recommendations and relevant information discussed.  CCM LCSW collaborated with Katheren Puller, LCSW with Palliative care to assist with meeting patient's needs.    Follow up Plan:  CCM LCSW will continue to collaborate with Brayton Layman, LCSW at [facility] as needed in order to meet patient's needs . This LCSW will remain part of care team for the next 90 days, if no needs are identify will disconnect at that time   Review of patient past medical history, allergies, medications, and health status, including review of pertinent consultant reports was performed as part of comprehensive evaluation and provision of care management/care coordination services.     Casimer Lanius, LCSW Licensed Clinical Social Worker [facility]  [facility] [place] [phone]

## 2022-03-21 NOTE — Patient Instructions (Signed)
     Patient was not contacted during this encounter.  LCSW collaborated with care team to accomplish patient's care plan goal   Casimer Lanius, LCSW Licensed Clinical Social Worker [facility] [place] [phone]

## 2022-03-22 NOTE — Progress Notes (Signed)
COMMUNITY PALLIATIVE CARE SW NOTE  PATIENT NAME: EYLIN PONTARELLI DOB: [birth year] MRN: [id number]  PRIMARY CARE PROVIDER: Vivi Barrack, MD  RESPONSIBLE PARTY:  Acct ID - Guarantor Home Phone Work Phone Relationship Acct Type  [id number] CHARLYNE, ROBERTSHAW* [phone]  Self P/F     [address] [place], [place] [address]   SOCIAL WORK TELEPHONIC ENCOUNTER (3:50 pm-4:10 pm)  PC SW completed a telephonic encounter with patient's daughter-[name]. [name] requested assistance with applying for PCS services for her mother. SW advised her that patient does not have full medicaid, as it only pays for her medicare premium, and it will not cover PCS services. [name] advised that she wished she had known this earlier. SW advised that although they had previously discussed it, SW was not aware of the type of medicaid patient had. SW inquired if she had considered PACE any further and she stated "no". She states that she has a nurse coming out to the home on Thursday, but could not tell SW what agency the nurse was coming from. She stated that she was hoping to get some help from them. SW reinforced ongoing support by the palliative care team and encouraged her to call with any questions or concerns.   SW consulted with patient's CCM SW-Deborah Laurance Flatten regarding any additional resources she could suggest for patient/PCG. Both PC and CCM SW have suggested several of the same resources that was met with resistance from the PCG. PC SW to provide ongoing support to patient and family and as needed. PC SW to follow-up with PCG in the 2-4 weeks to provide support and assess any needs.   [address] [place], [place]

## 2022-03-23 ENCOUNTER — Other Ambulatory Visit: Payer: Medicare Other | Admitting: Hospice

## 2022-03-23 ENCOUNTER — Telehealth: Payer: Self-pay | Admitting: Hospice

## 2022-03-23 DIAGNOSIS — Z515 Encounter for palliative care: Secondary | ICD-10-CM

## 2022-03-23 NOTE — Telephone Encounter (Signed)
NP called [name] several times for scheduled visit, not picked; could not drop voicemail because voicemail was full.  NP arrived home and no one answered the door.

## 2022-03-27 DIAGNOSIS — M1990 Unspecified osteoarthritis, unspecified site: Secondary | ICD-10-CM | POA: Diagnosis not present

## 2022-03-27 DIAGNOSIS — K219 Gastro-esophageal reflux disease without esophagitis: Secondary | ICD-10-CM | POA: Diagnosis not present

## 2022-03-27 DIAGNOSIS — N390 Urinary tract infection, site not specified: Secondary | ICD-10-CM | POA: Diagnosis not present

## 2022-03-27 DIAGNOSIS — I5032 Chronic diastolic (congestive) heart failure: Secondary | ICD-10-CM | POA: Diagnosis not present

## 2022-03-27 DIAGNOSIS — E785 Hyperlipidemia, unspecified: Secondary | ICD-10-CM | POA: Diagnosis not present

## 2022-03-27 DIAGNOSIS — M109 Gout, unspecified: Secondary | ICD-10-CM | POA: Diagnosis not present

## 2022-03-27 DIAGNOSIS — I110 Hypertensive heart disease with heart failure: Secondary | ICD-10-CM | POA: Diagnosis not present

## 2022-03-28 ENCOUNTER — Encounter (HOSPITAL_COMMUNITY): Payer: Self-pay | Admitting: Psychiatry

## 2022-03-28 ENCOUNTER — Telehealth (HOSPITAL_BASED_OUTPATIENT_CLINIC_OR_DEPARTMENT_OTHER): Payer: Medicare Other | Admitting: Psychiatry

## 2022-03-28 VITALS — Wt 220.0 lb

## 2022-03-28 DIAGNOSIS — F200 Paranoid schizophrenia: Secondary | ICD-10-CM | POA: Diagnosis not present

## 2022-03-28 DIAGNOSIS — F419 Anxiety disorder, unspecified: Secondary | ICD-10-CM | POA: Diagnosis not present

## 2022-03-28 DIAGNOSIS — F09 Unspecified mental disorder due to known physiological condition: Secondary | ICD-10-CM

## 2022-03-28 MED ORDER — OLANZAPINE 2.5 MG PO TABS: ORAL_TABLET | ORAL | 1 refills | Status: DC

## 2022-03-28 MED ORDER — BUPROPION HCL ER (XL) 300 MG PO TB24: 300.0000 mg | ORAL_TABLET | Freq: Every morning | ORAL | 1 refills | Status: DC
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
## 2022-03-29 DIAGNOSIS — I10 Essential (primary) hypertension: Secondary | ICD-10-CM

## 2022-03-29 DIAGNOSIS — M1990 Unspecified osteoarthritis, unspecified site: Secondary | ICD-10-CM

## 2022-03-29 DIAGNOSIS — F0390 Unspecified dementia without behavioral disturbance: Secondary | ICD-10-CM

## 2022-04-06 DIAGNOSIS — I5032 Chronic diastolic (congestive) heart failure: Secondary | ICD-10-CM | POA: Diagnosis not present

## 2022-04-16 ENCOUNTER — Other Ambulatory Visit (HOSPITAL_COMMUNITY): Payer: Self-pay | Admitting: Psychiatry

## 2022-04-16 DIAGNOSIS — F419 Anxiety disorder, unspecified: Secondary | ICD-10-CM

## 2022-04-16 DIAGNOSIS — F09 Unspecified mental disorder due to known physiological condition: Secondary | ICD-10-CM

## 2022-04-16 DIAGNOSIS — F200 Paranoid schizophrenia: Secondary | ICD-10-CM

## 2022-04-19 DIAGNOSIS — I509 Heart failure, unspecified: Secondary | ICD-10-CM | POA: Diagnosis not present

## 2022-04-19 DIAGNOSIS — I10 Essential (primary) hypertension: Secondary | ICD-10-CM | POA: Diagnosis not present

## 2022-05-01 ENCOUNTER — Other Ambulatory Visit (HOSPITAL_COMMUNITY): Payer: Self-pay | Admitting: Psychiatry

## 2022-05-01 DIAGNOSIS — F419 Anxiety disorder, unspecified: Secondary | ICD-10-CM

## 2022-05-01 DIAGNOSIS — F09 Unspecified mental disorder due to known physiological condition: Secondary | ICD-10-CM

## 2022-05-01 DIAGNOSIS — F200 Paranoid schizophrenia: Secondary | ICD-10-CM

## 2022-05-18 DIAGNOSIS — M1990 Unspecified osteoarthritis, unspecified site: Secondary | ICD-10-CM | POA: Diagnosis not present

## 2022-05-18 DIAGNOSIS — Z87440 Personal history of urinary (tract) infections: Secondary | ICD-10-CM | POA: Diagnosis not present

## 2022-05-18 DIAGNOSIS — R54 Age-related physical debility: Secondary | ICD-10-CM | POA: Diagnosis not present

## 2022-05-18 DIAGNOSIS — I110 Hypertensive heart disease with heart failure: Secondary | ICD-10-CM | POA: Diagnosis not present

## 2022-05-19 ENCOUNTER — Telehealth: Payer: Self-pay | Admitting: Family Medicine

## 2022-05-19 NOTE — Telephone Encounter (Signed)
[name] called re patient - she would like a call from [employer] papers.- She did not provide further information.

## 2022-05-23 ENCOUNTER — Other Ambulatory Visit: Payer: Self-pay | Admitting: Family Medicine

## 2022-05-25 NOTE — Telephone Encounter (Signed)
FMLA placed at PCP office to be sign

## 2022-05-29 NOTE — Telephone Encounter (Signed)
FMLA faxed to [phone] Copy mail to patient

## 2022-05-29 NOTE — Telephone Encounter (Signed)
FMLA placed of daughters chart

## 2022-06-01 ENCOUNTER — Encounter: Payer: Self-pay | Admitting: Family Medicine

## 2022-06-01 ENCOUNTER — Telehealth: Payer: Self-pay | Admitting: Pharmacist

## 2022-06-01 NOTE — Progress Notes (Signed)
Chronic Care Management Pharmacy Assistant   Name: [name]  MRN: [id number] DOB: [birth year]   Reason for Encounter: General Adherence Call    Recent office visits:  01/20/2022 OV (PCP) Shelby Barrack, MD; no medication changes indicated.  01/12/2022 VV (PCP) Shelby Barrack, MD; no medication changes indicated.  Recent consult visits:  02/01/2022 OV (Cardiology) Shelby Bergeron, MD; no medication changes indicated.  Hospital visits:  None in previous 6 months  Medications: Outpatient Encounter Medications as of 06/01/2022  Medication Sig   acetaminophen (TYLENOL) 500 MG tablet '500mg'$  at night and every 6 hours as needed.   allopurinol (ZYLOPRIM) 300 MG tablet TAKE 1 TABLET BY MOUTH  DAILY   amLODipine (NORVASC) 5 MG tablet Take 1 tablet (5 mg total) by mouth daily.   ascorbic acid (VITAMIN C) 500 MG tablet Take 500 mg by mouth daily.   atorvastatin (LIPITOR) 40 MG tablet TAKE 1 TABLET BY MOUTH  DAILY   buPROPion (WELLBUTRIN XL) 300 MG 24 hr tablet Take 1 tablet (300 mg total) by mouth every morning.   carvedilol (COREG) 3.125 MG tablet Take 1 tablet (3.125 mg total) by mouth 2 (two) times daily with a meal.   Cyanocobalamin (VITAMIN B12) 1000 MCG TBCR Take 1 tablet by mouth daily.   diclofenac Sodium (VOLTAREN) 1 % GEL Apply 4 g topically 4 (four) times daily as needed (for pain).   donepezil (ARICEPT) 5 MG tablet Take 1 tablet (5 mg total) by mouth at bedtime.   fluticasone (FLONASE) 50 MCG/ACT nasal spray Place into both nostrils as needed for allergies or rhinitis.   ketoconazole (NIZORAL) 2 % cream Apply 1 application. topically 2 (two) times daily as needed for irritation.   lamoTRIgine (LAMICTAL) 25 MG tablet Take 2 tablets twice a day   losartan (COZAAR) 100 MG tablet TAKE 1 TABLET BY MOUTH  DAILY   nystatin (MYCOSTATIN/NYSTOP) powder Apply 1 application. topically 3 (three) times daily as needed (yeast infection).   OLANZapine (ZYPREXA) 2.5 MG tablet  Take 1 tab at bedtime   pantoprazole (PROTONIX) 40 MG tablet TAKE 1 TABLET BY MOUTH TWICE  DAILY BEFORE MEALS   No facility-administered encounter medications on file as of 06/01/2022.   Contacted [name] for General Review Call   Chart Review:  Have there been any documented new, changed, or discontinued medications since last visit? No (If yes, include name, dose, frequency, date) Has there been any documented recent hospitalizations or ED visits since last visit with Clinical Pharmacist? No Brief Summary (including medication and/or Diagnosis changes):   Adherence Review:  Does the Clinical Pharmacist Assistant have access to adherence rates? Yes Adherence rates for STAR metric medications: Atorvastatin 40 mg last filled 03/11/2022 100 DS Losartan 100 mg last filled 03/11/2022 100 DS Does the patient have >5 day gap between last estimated fill dates for any of the above medications or other medication gaps? No Reason for medication gaps.   Disease State Questions:  Able to connect with Patient? Yes Did patient have any problems with their health recently? Yes, patient's daughter states the patient is currently on Palliative care. She is unable to get out of bed on her own. Due to patients inability to get out of bed she has developed some bed sores. Patient states these sores are taking longer than usual to heal. Have you had any admissions or emergency room visits or worsening of your condition(s) since last visit? No Have you had any visits with  new specialists or providers since your last visit? No Have you had any new health care problem(s) since your last visit? No Have you run out of any of your medications since you last spoke with clinical pharmacist? No Are there any medications you are not taking as prescribed? No Are you having any issues or side effects with your medications? No Do you have any other health concerns or questions you want to discuss with your  Clinical Pharmacist before your next visit? No Are there any health concerns that you feel we can do a better job addressing? No Are you having any problems with any of the following since the last visit: (select all that apply)  None 12. Any falls since last visit? No 13. Any increased or uncontrolled pain since last visit? No - patient's daughter states although the patient has some slow healing bed sores, she doesn't seem to be in a great amount of pain.  Care Gaps: Medicare Annual Wellness: Completed 07/01/2021 Hemoglobin A1C: 6.7% on 03/27/2021 Colonoscopy: Aged out Dexa Scan: Completed Mammogram: Overdue since 10/05/2020  Future Appointments  Date Time Provider [place]  07/10/2022  2:30 PM Shelby Sprang, MD [facility] None  09/28/2022  2:00 PM [name], Shelby Harman, MD [facility] None   Star Rating Drugs: Atorvastatin 40 mg last filled 03/11/2022 100 DS Losartan 100 mg last filled 03/11/2022 100 DS  [name], [place] Pharmacist Assistant [phone]

## 2022-06-04 ENCOUNTER — Encounter: Payer: Self-pay | Admitting: Family Medicine

## 2022-06-05 NOTE — Telephone Encounter (Signed)
See note

## 2022-06-05 NOTE — Telephone Encounter (Signed)
Left message to return call to our office at their convenience.  FMLA was faxed on 05/25/2022

## 2022-06-06 NOTE — Telephone Encounter (Signed)
Spoke with patient Daughter, patient was informed FMLA was faxed  Stated will talk to agency to veryfied what we need to add

## 2022-06-07 NOTE — Telephone Encounter (Signed)
I have not seen an FMLA that we can amend. Have they sent one over to our office?  Algis Greenhouse. [name], MD 06/07/2022 8:06 AM

## 2022-06-07 NOTE — Telephone Encounter (Signed)
Patient daughter call stated FMLA was received, only page one  FMLA re faxed to [phone]

## 2022-06-26 ENCOUNTER — Telehealth: Payer: Self-pay | Admitting: Family Medicine

## 2022-06-26 DIAGNOSIS — I110 Hypertensive heart disease with heart failure: Secondary | ICD-10-CM | POA: Diagnosis not present

## 2022-06-26 NOTE — Telephone Encounter (Signed)
Caller states: -Received a hospice order for patient. -patient's family has asked for PCP to be "Attending of record" for hospice care.  Caller asks: -Does PCP consent to be Attending of Record? -If So, does PCP wish to manage the comfort orders or defer to the [facility]   If he does, does he wish to manage this own comfort order or defer to the [facility] physician.    Caller requests: -Call back with determination.

## 2022-06-26 NOTE — Telephone Encounter (Signed)
See note

## 2022-06-27 DIAGNOSIS — I110 Hypertensive heart disease with heart failure: Secondary | ICD-10-CM | POA: Diagnosis not present

## 2022-06-27 DIAGNOSIS — E785 Hyperlipidemia, unspecified: Secondary | ICD-10-CM | POA: Diagnosis not present

## 2022-06-27 DIAGNOSIS — R54 Age-related physical debility: Secondary | ICD-10-CM | POA: Diagnosis not present

## 2022-06-27 NOTE — Telephone Encounter (Signed)
I am fine with being attending of record. Will defer comfort orders to the [facility] physician.  Algis Greenhouse. [name], MD 06/27/2022 9:00 AM

## 2022-07-08 NOTE — Telephone Encounter (Signed)
Spoke with patient daughter, need FMLA to be faxed again

## 2022-07-10 ENCOUNTER — Telehealth: Payer: Medicare Other | Admitting: Neurology

## 2022-07-11 NOTE — Telephone Encounter (Signed)
FMLA faxed to day [phone]

## 2022-07-12 ENCOUNTER — Other Ambulatory Visit: Payer: Self-pay | Admitting: *Deleted

## 2022-07-12 MED ORDER — AMLODIPINE BESYLATE 5 MG PO TABS: 5.0000 mg | ORAL_TABLET | Freq: Every day | ORAL | 2 refills | Status: DC

## 2022-07-13 ENCOUNTER — Telehealth: Payer: Self-pay | Admitting: Family Medicine

## 2022-07-13 NOTE — Telephone Encounter (Signed)
Caller states: -Patient and daughter are revoking hospice care but would like to continue with palliative care    Caller requests: -A callback at [phone] option 2 with PCP's approval of continuing palliative care

## 2022-07-14 NOTE — Telephone Encounter (Signed)
Ok to give VO? 

## 2022-07-14 NOTE — Telephone Encounter (Signed)
April Nurse with [facility] has been revoked by Patient- requests the following:  Verbal Orders  Agency: [facility]   Caller: April  Contact and title- [phone]  Requesting Palliative Care:    Reason for Request:  Hospice Care was revoked

## 2022-07-14 NOTE — Telephone Encounter (Signed)
Ok with me. Please place any necessary orders. 

## 2022-07-20 NOTE — Telephone Encounter (Signed)
Call [phone] April  Left voice message with VO ok to continue [place] care

## 2022-07-24 ENCOUNTER — Encounter: Payer: Self-pay | Admitting: *Deleted

## 2022-07-24 DIAGNOSIS — H5789 Other specified disorders of eye and adnexa: Secondary | ICD-10-CM | POA: Diagnosis not present

## 2022-07-25 ENCOUNTER — Encounter: Payer: Self-pay | Admitting: Neurology

## 2022-07-25 ENCOUNTER — Telehealth: Payer: Medicare Other | Admitting: Student

## 2022-07-25 ENCOUNTER — Telehealth: Payer: Medicare Other | Admitting: Neurology

## 2022-07-25 DIAGNOSIS — G40209 Localization-related (focal) (partial) symptomatic epilepsy and epileptic syndromes with complex partial seizures, not intractable, without status epilepticus: Secondary | ICD-10-CM

## 2022-07-25 DIAGNOSIS — F03B18 Unspecified dementia, moderate, with other behavioral disturbance: Secondary | ICD-10-CM

## 2022-07-25 MED ORDER — DONEPEZIL HCL 5 MG PO TABS: 5.0000 mg | ORAL_TABLET | Freq: Every day | ORAL | 3 refills | Status: DC

## 2022-07-25 MED ORDER — LAMOTRIGINE 25 MG PO TABS: ORAL_TABLET | ORAL | 3 refills | Status: DC

## 2022-07-25 NOTE — Progress Notes (Signed)
Patient a No show for Mychart visit.

## 2022-07-25 NOTE — Progress Notes (Signed)
Virtual Visit via Video Note The purpose of this virtual visit is to provide medical care while limiting exposure to the novel coronavirus.    Consent was obtained for video visit:  Yes.   Answered questions that patient's daughter had about telehealth interaction:  Yes.   I discussed the limitations, risks, security and privacy concerns of performing an evaluation and management service by telemedicine. I also discussed with the patient that there may be a patient responsible charge related to this service. The patient expressed understanding and agreed to proceed.  Pt location: Home Physician Location: office Name of referring provider:  Vivi Barrack, MD I connected with [name] at patients initiation/request on 07/25/2022 at  1:30 PM EDT by video enabled telemedicine application and verified that I am speaking with the correct person using two identifiers. Pt MRN:  [id number] Pt DOB:  06-Dec-1940 Video Participants:  [name];  [name] (daughter)   History of Present Illness:  The patient had a virtual video visit on 07/25/2022. She was last seen in the neurology clinic 6 months ago for severe dementia and left temporal lobe epilepsy. Her daughter [name] provides the information. She is now bedbound with minimal verbal output, does not follow commands/respond. She is not eating much, [name] feeds her soft foods and notes she may have a harder time swallowing, no choking. She still has a cough with her CHF but it is much better. She is mostly in the bed due to falls. She is on Donepezil '5mg'$  daily. [name] denies any seizures since 02/2021 on Lamotrigine '50mg'$  BID. She sees psychiatrist Dr. Adele Schilder and takes olanzapine and Wellbutrin. [name] states she is not agitated, but fidgets more when awake. Sometimes she will just cry. She fights a lot when they roll her for transfers, she has a great fear of falling.    History on Initial Assessment 04/01/2021: This is a 81 year old  left-handed woman with a history of hypertension, hyperlipidemia, paranoid schizophrenia, presenting for evaluation of memory loss and new onset seizure. She is accompanied by her daughter [name] who helps supplement the history today. She was previously evaluated by neurologist Dr. Jannifer Franklin in 2018 for memory loss that started around 2017. She felt something was different herself. MMSE 29/30 at that time. She was found to have bilateral subdural hematomas on brain MRI in 12/2016 after a fall. She had a burr hole and "went through hell and back." She got better to the point where she was doing things, putting her medications in her pillbox and taking them. [name] took over finances in 2018. In 2019, she became belligerent. She does not drive. She was started on Donepezil '5mg'$  daily. Memantine was started in 2019 but she had side effects of more confusion, insomnia. She was lost to follow-up but has continued to see her psychiatrist. Her daughter reports that "every January is when it happens." In January 2020, DSS called and told [name] she needed to get legal guardianship, she had been calling/threatening people and stopped her medications. Since then, [name] took over medications. That year she could still carry on a conversation. In 2021, it "all went away," she would ramble and could not hold a thought. She started needing help with bathing. Last January 2022, she started ripping her shirt, blanket out of the blue. She called Dr. Adele Schilder and Donepezil was restarted which helped after a couple of days. She was also started on olanzapine. Since then, she has been much better, she is  not ripping things anymore, family gives her fidget toys which helps. She has urinary incontinence and wears Depends. Rare bowel incontinence. She does not sleep more than 3 hours at a time, up at night and sleeping in the daytime. She is fidgety sometimes but not like in January.   She was [facility] to [facility] 03/26/21 after an episode of  generalized shaking, transient unresponsiveness with tongue hanging out of her mouth, leaning to the left side. This lasted 15 seconds, they noticed left side was weaker after then improved. 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 Her EEG was abnormal with intermittent left frontal theta slowing, single spike in the left frontotemporal region. She was discharged home on Depakote '500mg'$  BID but started complaining of a headache after 3 doses. She mostly used to have headaches when her BP is high. She does not recall being admitted to the hospital. Her son lives with her, [name] is her legal guardian and stays the night. Family administers medications. Her mother and grandmother had dementia. No family history of seizures.      Current Outpatient Medications on File Prior to Visit  Medication Sig Dispense Refill   acetaminophen (TYLENOL) 500 MG tablet '500mg'$  at night and every 6 hours as needed. 30 tablet    allopurinol (ZYLOPRIM) 300 MG tablet TAKE 1 TABLET BY MOUTH  DAILY 100 tablet 2   amLODipine (NORVASC) 5 MG tablet Take 1 tablet (5 mg total) by mouth daily. 90 tablet 2   ascorbic acid (VITAMIN C) 500 MG tablet Take 500 mg by mouth daily.     atorvastatin (LIPITOR) 40 MG tablet TAKE 1 TABLET BY MOUTH  DAILY 100 tablet 2   buPROPion (WELLBUTRIN XL) 300 MG 24 hr tablet Take 1 tablet (300 mg total) by mouth every morning. 90 tablet 1   carvedilol (COREG) 3.125 MG tablet Take 1 tablet (3.125 mg total) by mouth 2 (two) times daily with a meal. 180 tablet 3   Cyanocobalamin (VITAMIN B12) 1000 MCG TBCR Take 1 tablet by mouth daily.     diclofenac Sodium (VOLTAREN) 1 % GEL Apply 4 g topically 4 (four) times daily as needed (for pain).     donepezil (ARICEPT) 5 MG tablet Take 1 tablet (5 mg total) by mouth at bedtime. 90 tablet 3   fluticasone (FLONASE) 50 MCG/ACT nasal spray Place into both nostrils as needed for allergies or  rhinitis.     ketoconazole (NIZORAL) 2 % cream Apply 1 application. topically 2 (two) times daily as needed for irritation. 60 g 0   lamoTRIgine (LAMICTAL) 25 MG tablet Take 2 tablets twice a day 360 tablet 3   losartan (COZAAR) 100 MG tablet TAKE 1 TABLET BY MOUTH  DAILY 100 tablet 2   nystatin (MYCOSTATIN/NYSTOP) powder Apply 1 application. topically 3 (three) times daily as needed (yeast infection). 60 g 0   OLANZapine (ZYPREXA) 2.5 MG tablet Take 1 tab at bedtime 90 tablet 1   pantoprazole (PROTONIX) 40 MG tablet TAKE 1 TABLET BY MOUTH TWICE  DAILY BEFORE MEALS 200 tablet 2   No current facility-administered medications on file prior to visit.     Observations/Objective:   Vitals:   07/25/22 1229  Weight: 200 lb (90.7 kg)  Height: '5\' 4"'$  (1.626 m)   GEN:  The patient appears stated age and is in NAD.  Neurological examination: Patient is awake, minimal verbal output ("no"), does not follow instructions. Briefly looks at  daughter, poor attention.    Assessment and Plan:   This is an 81 yo LH woman with a history of hypertension, hyperlipidemia, paranoid schizophrenia, with severe dementia with behavioral disturbance and left temporal lobe epilepsy. She has been seizure-free since 02/2021 on Lamotrigine '50mg'$  BID. We again discussed Donepezil '5mg'$  daily, there is likely no further benefit with regards to memory, however [name] is hesitant to stop it if it is helping with behaviors. We agreed to continue medications. Follow-up with PCP for safety labs. Continue 24/7 care. Follow-up in 8 months, call for any changes.    Follow Up Instructions:   -I discussed the assessment and treatment plan with the patient. The patient was provided an opportunity to ask questions and all were answered. The patient agreed with the plan and demonstrated an understanding of the instructions.   The patient was advised to call back or seek an in-person evaluation if the symptoms worsen or if the condition  fails to improve as anticipated.     Cameron Sprang, MD

## 2022-07-25 NOTE — Patient Instructions (Signed)
Good to see you. Refills sent for the Donepezil and Lamotrigine. Continue 24/7 care. Follow-up in 8 months, call for any changes.

## 2022-07-27 ENCOUNTER — Other Ambulatory Visit: Payer: Medicare Other | Admitting: Student

## 2022-07-27 DIAGNOSIS — R053 Chronic cough: Secondary | ICD-10-CM

## 2022-07-27 DIAGNOSIS — F01C0 Vascular dementia, severe, without behavioral disturbance, psychotic disturbance, mood disturbance, and anxiety: Secondary | ICD-10-CM

## 2022-07-27 DIAGNOSIS — I679 Cerebrovascular disease, unspecified: Secondary | ICD-10-CM

## 2022-07-27 DIAGNOSIS — I5032 Chronic diastolic (congestive) heart failure: Secondary | ICD-10-CM | POA: Diagnosis not present

## 2022-07-27 DIAGNOSIS — Z515 Encounter for palliative care: Secondary | ICD-10-CM

## 2022-07-28 NOTE — Progress Notes (Signed)
[facility] Palliative Care Consult Note Telephone: [phone]  Fax: [phone]   Date of encounter: 07/27/22  PATIENT NAME: [name] [id number]   [phone] (home)  DOB: Mar 21, 1941 MRN: [id number] PRIMARY CARE PROVIDER:    Vivi Barrack, MD,  [address] [place] [address] [phone]  REFERRING PROVIDER:   Vivi Drake, [place],  [place] [address] [phone]  RESPONSIBLE PARTY:    Contact Information     Name Relation Home Work Mobile   [name] Daughter [phone]     [name] [phone]          I met face to face with patient and family in the home. Palliative Care was asked to follow this patient by consultation request of  Shelby Barrack, MD to address advance care planning and complex medical decision making. This is the initial visit.                                     ASSESSMENT AND PLAN / RECOMMENDATIONS:   Advance Care Planning/Goals of Care: Goals include to maximize quality of life and symptom management. Patient/health care surrogate gave his/her permission to discuss.Our advance care planning conversation included a discussion about:    The value and importance of advance care planning  Experiences with loved ones who have been seriously ill or have died  Exploration of personal, cultural or spiritual beliefs that might influence medical decisions  Exploration of goals of care in the event of a sudden injury or illness  Daughter is legal guardian CODE STATUS: Full Code  Education provided on palliative medicine versus hospice services.  Patient recently was briefly on hospice services; daughter revoked services as she felt patient was not ready for hospice just yet and felt overwhelmed.  We also discussed CODE STATUS she would like for patient to continue being a Full Code.  Education provided on what CPR would look like for patient.  This will  be a ongoing discussion. Will monitor for changes and declines; will refer back to hospice when family is agreeable.   Symptom Management/Plan:  Cerebrovascular disease-patient is dependent for all adl's. We discussed discontinuing statin. Family to continue providing assist with al adl's.   Dementia-patient is dependent for all ADL. Reorient and redirect as needed. Patient is primarily in bed due to recent falls; difficulty transferring. She requires assist x 2 to reposition and turn in bed. Monitor for worsening dysphagia. Continue soft foods, elevate HOB. Patient currently receiving donepezil; discussed discontinuing medication.   Chronic Diastolic Heart Failure-patient with shortness of breath; no edema. Continue beta blocker , losartan as directed. Not currently on any diuretics. Monitor for worsening shortness of breath, edema, fatigue.   Cough-patient with chronic non productive cough. Discussed possible causes such as allergic rhinitis,  GERD, heart failure; although she appears euvolemic presently. She is encouraged to use Flonase, keep HOB elevated after eating or drinking. She is receiving duonebs PRN.  Follow up Palliative Care Visit: Palliative care will continue to follow for complex medical decision making, advance care planning, and clarification of goals. Return 4 weeks or prn.  I spent 75 minutes providing this consultation. More than 50% of the time in this consultation was spent in counseling and care coordination.   PPS: 30%  HOSPICE ELIGIBILITY/DIAGNOSIS: TBD  Chief Complaint: Palliative medicine initial consult  HISTORY  OF PRESENT ILLNESS:  [name] is a 81 y.o. year old female  with cerebrovascular disease, vascular dementia, congestive heart failure, depression, schizophrenia, diverticulitis, GERD, hyperlipidemia, hypertension, seizure disorder, CKD 3B, TIAs, OA, OSA, allergic rhinitis, reactive airway disease, gout.  Patient resides at home with her  daughter. Patient briefly on hospice services. Daughter revoked services as she states she felt overwhelmed and also patient is not ready for hospice at this time. Patient is dependent for all adl's. She has a caregiver coming to assist with care on the weekends. Daughter reports some skin breakdown to buttocks; she is managing. She endorses some agitation at night. Patient is eating softer foods; fair appetite. If not soft foods, she will chew and spit out. She is being fed. She also drinks 2-3 ensures daily. Patient with non-productive cough. Edema to LE has improved. Patient seen by [facility] on 9/25; cipro eye drops ordered due to crusting and drainage.    Patient received resting in bed. She does arouse to stimulation. She does not exhibit any signs of pain or discomfort. No drainage observed to eyes.   History obtained from review of EMR, discussion with primary team, and interview with family, facility staff/caregiver and/or [name].  I reviewed available labs, medications, imaging, studies and related documents from the EMR.  Records reviewed and summarized above.   ROS  Unable to obtain d/t dementia.   Physical Exam: Pulse 72,  resp 24, b/p 142/90, sats 93% on room air Constitutional: NAD General: frail appearing, obese  EYES: anicteric sclera, lids intact, no discharge  ENMT: intact hearing, oral mucous membranes dry CV: S1S2, RRR, no LE edema Pulmonary: scattered rhonchi, no increased work of breathing, non-productive cough, room air Abdomen: normo-active BS + 4 quadrants, soft and non tender GU: deferred MSK: non-ambulatory Skin: warm and dry, no rashes; buttocks not visualized Neuro:  +generalized weakness,  alert, oriented to person Psych: non-anxious affect, calm, cooperative Hem/lymph/immuno: no widespread bruising CURRENT PROBLEM LIST:  Patient Active Problem List   Diagnosis Date Noted   Seizure disorder ([place]) 03/29/2021   Dementia ([place]) 03/26/2021   Debility  11/06/2019   Venous stasis of lower extremity 10/14/2019   Hyperglycemia 05/06/2018   Constipation 04/30/2018   Osteoarthritis 01/31/2018   Lower extremity edema 07/31/2017   History of bilateral hip replacements    History of lumbar laminectomy for spinal cord decompression 02/02/2016   Atopic dermatitis 07/09/2015   Reactive airway disease 08/11/2014   History of cancer 08/11/2014   OSA (obstructive sleep apnea) 08/11/2014   Chronic kidney disease (CKD), stage III (moderate) (HCC) 10/04/2012   Schizophrenia, chronic condition ([place]) 01/20/2012   Postmenopausal atrophic vaginitis 11/01/2010   Chronic diastolic heart failure (HCC) 05/06/2010   Allergic rhinitis 10/14/2009   Obesity, Class II, BMI 35-39.9, with comorbidity 05/14/2009   Chronic prescription opiate use 04/29/2008   Normocytic anemia 09/25/2007   Hyperlipidemia 09/03/2006   Gout 09/03/2006   Essential hypertension 09/03/2006   GERD 09/03/2006   PAST MEDICAL HISTORY:  Active Ambulatory Problems    Diagnosis Date Noted   Hyperlipidemia 09/03/2006   Gout 09/03/2006   Obesity, Class II, BMI 35-39.9, with comorbidity 05/14/2009   Normocytic anemia 09/25/2007   Essential hypertension 09/03/2006   Chronic diastolic heart failure ([place]) 05/06/2010   Allergic rhinitis 10/14/2009   GERD 09/03/2006   Chronic prescription opiate use 04/29/2008   Postmenopausal atrophic vaginitis 11/01/2010   Schizophrenia, chronic condition ([place]) 01/20/2012   Chronic kidney disease (CKD), stage III (moderate) (  [place]) 10/04/2012   Reactive airway disease 08/11/2014   History of cancer 08/11/2014   OSA (obstructive sleep apnea) 08/11/2014   Atopic dermatitis 07/09/2015   History of lumbar laminectomy for spinal cord decompression 02/02/2016   History of bilateral hip replacements    Lower extremity edema 07/31/2017   Osteoarthritis 01/31/2018   Constipation 04/30/2018   Hyperglycemia 05/06/2018   Venous stasis of lower extremity  10/14/2019   Debility 11/06/2019   Dementia ([place]) 03/26/2021   Seizure disorder ([place]) 03/29/2021   Resolved Ambulatory Problems    Diagnosis Date Noted   Unspecified visual loss 04/13/2009   VENOUS INSUFFICIENCY 09/03/2006   INTERTRIGO, CANDIDAL 03/13/2008   DEGENERATIVE DISC DISEASE 09/03/2006   LEG EDEMA, BILATERAL 06/19/2008   DYSPNEA ON EXERTION 03/11/2010   SHIN SPLINTS 08/26/2010   SPRAIN AND STRAIN OF CALCANEOFIBULAR 10/05/2010   Personal history of unspecified circulatory disease 09/03/2006   UPPER RESPIRATORY INFECTION, VIRAL 11/01/2010   Dysuria 11/28/2007   Hx-TIA (transient ischemic attack) 05/13/2001   Mitral valve prolapse 10/27/1999   Dysuria 06/22/2011   Cough 06/22/2011   Cough 06/23/2011   Night sweats 10/04/2012   Menopausal symptom 11/11/2012   Acute upper respiratory infections of unspecified site 12/20/2012   Healthcare maintenance 07/17/2013   UTI (urinary tract infection) 07/28/2013   Abnormal blood findings 08/13/2014   Back pain at L4-L5 level 05/03/2016   Memory disorder 12/20/2016   SDH (subdural hematoma) ([place]) 02/10/2017   TBI (traumatic brain injury) ([place])    Subdural hemorrhage following injury without open intracranial wound and with loss of consciousness of 30 minutes or less, sequela ([place]) 03/08/2017   Frequent urination 07/31/2017   Diarrhea 32/44/0102   Uncomplicated opioid dependence ([place]) 01/31/2018   Dyslipidemia 04/30/2018   Keloid 08/01/2018   Pain of right thumb 01/06/2019   Cellulitis of right breast 06/13/2019   Acute CHF (congestive heart failure) ([place]) 09/22/2020   Acute on chronic diastolic CHF (congestive heart failure) ([place]) 09/22/2020   Acute on chronic diastolic CHF (congestive heart failure) ([place]) 03/26/2021   TIA (transient ischemic attack) 03/26/2021   Obesity, Class III, BMI 40-49.9 (morbid obesity) ([place]) 03/26/2021   Past Medical History:  Diagnosis Date   CHF (congestive heart failure) (HCC)    Chronic venous  insufficiency    Colon, diverticulosis 06/2010   DDD (degenerative disc disease), lumbosacral    Depression    DJD (degenerative joint disease), multiple sites    GERD (gastroesophageal reflux disease)    Gout    Gout    History of cervical cancer 1972   Hypertension    Osteoarthritis of hip    Ovarian cancer ([place]) 1972   Psychosis ([place])    Recurrent boils    Seizures ([place])    Sensorineural hearing loss of both ears    Subdural hematoma ([place]) 01/26/2017   SOCIAL HX:  Social History   Tobacco Use   Smoking status: Former    Packs/day: 0.10    Years: 35.00    Total pack years: 3.50    Types: Cigarettes    Quit date: 04/08/2013    Years since quitting: 9.3   Smokeless tobacco: Never   Tobacco comments:    smoked a few when her son died in Jan 03, 2013   Substance Use Topics   Alcohol use: No    Alcohol/week: 0.0 standard drinks of alcohol   FAMILY HX:  Family History  Problem Relation Age of Onset   Diabetes Mother    Heart disease Mother  Hearing loss Mother    Stroke Mother    Post-traumatic stress disorder Father    Schizophrenia Maternal Grandmother    Diabetes Maternal Grandfather    Other Brother        brother died in [facility]   Kidney cancer Neg Hx    Bladder Cancer Neg Hx       ALLERGIES:  Allergies  Allergen Reactions   Aspirin Other (See Comments)    Bleeding from vagina    Sulfamethoxazole-Trimethoprim Itching     PERTINENT MEDICATIONS:  Outpatient Encounter Medications as of 07/27/2022  Medication Sig   acetaminophen (TYLENOL) 500 MG tablet 58m at night and every 6 hours as needed.   allopurinol (ZYLOPRIM) 300 MG tablet TAKE 1 TABLET BY MOUTH  DAILY   amLODipine (NORVASC) 5 MG tablet Take 1 tablet (5 mg total) by mouth daily.   ascorbic acid (VITAMIN C) 500 MG tablet Take 500 mg by mouth daily.   atorvastatin (LIPITOR) 40 MG tablet TAKE 1 TABLET BY MOUTH  DAILY   buPROPion (WELLBUTRIN XL) 300 MG 24 hr tablet Take 1 tablet (300 mg  total) by mouth every morning.   carvedilol (COREG) 3.125 MG tablet Take 1 tablet (3.125 mg total) by mouth 2 (two) times daily with a meal.   Cyanocobalamin (VITAMIN B12) 1000 MCG TBCR Take 1 tablet by mouth daily.   diclofenac Sodium (VOLTAREN) 1 % GEL Apply 4 g topically 4 (four) times daily as needed (for pain).   donepezil (ARICEPT) 5 MG tablet Take 1 tablet (5 mg total) by mouth at bedtime.   fluticasone (FLONASE) 50 MCG/ACT nasal spray Place into both nostrils as needed for allergies or rhinitis.   ketoconazole (NIZORAL) 2 % cream Apply 1 application. topically 2 (two) times daily as needed for irritation.   lamoTRIgine (LAMICTAL) 25 MG tablet Take 2 tablets twice a day   losartan (COZAAR) 100 MG tablet TAKE 1 TABLET BY MOUTH  DAILY   nystatin (MYCOSTATIN/NYSTOP) powder Apply 1 application. topically 3 (three) times daily as needed (yeast infection).   OLANZapine (ZYPREXA) 2.5 MG tablet Take 1 tab at bedtime   pantoprazole (PROTONIX) 40 MG tablet TAKE 1 TABLET BY MOUTH TWICE  DAILY BEFORE MEALS   No facility-administered encounter medications on file as of 07/27/2022.   Thank you for the opportunity to participate in the care of Ms. Polidore.  The palliative care team will continue to follow. Please call our office at [phone] we can be of additional assistance.   LEzekiel Slocumb NP   COVID-19 PATIENT SCREENING TOOL Asked and negative response unless otherwise noted:  Have you had symptoms of covid, tested positive or been in contact with someone with symptoms/positive test in the past 5-10 days? No

## 2022-07-31 DIAGNOSIS — E785 Hyperlipidemia, unspecified: Secondary | ICD-10-CM | POA: Diagnosis not present

## 2022-07-31 DIAGNOSIS — I110 Hypertensive heart disease with heart failure: Secondary | ICD-10-CM | POA: Diagnosis not present

## 2022-07-31 DIAGNOSIS — R54 Age-related physical debility: Secondary | ICD-10-CM | POA: Diagnosis not present

## 2022-08-03 ENCOUNTER — Telehealth: Payer: Self-pay | Admitting: *Deleted

## 2022-08-03 NOTE — Patient Outreach (Signed)
  Care Coordination   08/03/2022 Name: [name] MRN: [id number] DOB: 28-Oct-1941   Care Coordination Outreach Attempts:  An unsuccessful telephone outreach was attempted today to offer the patient information about available care coordination services as a benefit of their health plan.   Follow Up Plan:  Additional outreach attempts will be made to offer the patient care coordination information and services.   Encounter Outcome:  No Answer  Care Coordination Interventions Activated:  No   Care Coordination Interventions:  No, not indicated    Raina Mina, RN Care Management Coordinator [place] [facility] [phone]

## 2022-08-07 ENCOUNTER — Inpatient Hospital Stay
Admission: EM | Admit: 2022-08-07 | Discharge: 2022-08-10 | DRG: 871 | Disposition: A | Discharge status: Hospice | Payer: Medicare Other | Attending: Internal Medicine | Admitting: Internal Medicine

## 2022-08-07 ENCOUNTER — Emergency Department: Payer: Medicare Other

## 2022-08-07 DIAGNOSIS — I499 Cardiac arrhythmia, unspecified: Secondary | ICD-10-CM | POA: Diagnosis not present

## 2022-08-07 DIAGNOSIS — M109 Gout, unspecified: Secondary | ICD-10-CM | POA: Diagnosis not present

## 2022-08-07 DIAGNOSIS — N390 Urinary tract infection, site not specified: Secondary | ICD-10-CM | POA: Diagnosis not present

## 2022-08-07 DIAGNOSIS — Z87891 Personal history of nicotine dependence: Secondary | ICD-10-CM | POA: Diagnosis not present

## 2022-08-07 DIAGNOSIS — I10 Essential (primary) hypertension: Secondary | ICD-10-CM | POA: Diagnosis not present

## 2022-08-07 DIAGNOSIS — Z823 Family history of stroke: Secondary | ICD-10-CM

## 2022-08-07 DIAGNOSIS — I129 Hypertensive chronic kidney disease with stage 1 through stage 4 chronic kidney disease, or unspecified chronic kidney disease: Secondary | ICD-10-CM | POA: Diagnosis not present

## 2022-08-07 DIAGNOSIS — Z882 Allergy status to sulfonamides status: Secondary | ICD-10-CM | POA: Diagnosis not present

## 2022-08-07 DIAGNOSIS — Z8543 Personal history of malignant neoplasm of ovary: Secondary | ICD-10-CM

## 2022-08-07 DIAGNOSIS — K219 Gastro-esophageal reflux disease without esophagitis: Secondary | ICD-10-CM | POA: Diagnosis not present

## 2022-08-07 DIAGNOSIS — R6520 Severe sepsis without septic shock: Secondary | ICD-10-CM | POA: Diagnosis not present

## 2022-08-07 DIAGNOSIS — I509 Heart failure, unspecified: Secondary | ICD-10-CM | POA: Diagnosis not present

## 2022-08-07 DIAGNOSIS — A419 Sepsis, unspecified organism: Secondary | ICD-10-CM | POA: Diagnosis present

## 2022-08-07 DIAGNOSIS — E875 Hyperkalemia: Secondary | ICD-10-CM | POA: Diagnosis not present

## 2022-08-07 DIAGNOSIS — J9811 Atelectasis: Secondary | ICD-10-CM | POA: Diagnosis not present

## 2022-08-07 DIAGNOSIS — Z886 Allergy status to analgesic agent status: Secondary | ICD-10-CM | POA: Diagnosis not present

## 2022-08-07 DIAGNOSIS — E785 Hyperlipidemia, unspecified: Secondary | ICD-10-CM | POA: Diagnosis not present

## 2022-08-07 DIAGNOSIS — Z66 Do not resuscitate: Secondary | ICD-10-CM | POA: Diagnosis present

## 2022-08-07 DIAGNOSIS — I110 Hypertensive heart disease with heart failure: Secondary | ICD-10-CM | POA: Diagnosis not present

## 2022-08-07 DIAGNOSIS — D631 Anemia in chronic kidney disease: Secondary | ICD-10-CM | POA: Diagnosis present

## 2022-08-07 DIAGNOSIS — F32A Depression, unspecified: Secondary | ICD-10-CM | POA: Diagnosis present

## 2022-08-07 DIAGNOSIS — Z96643 Presence of artificial hip joint, bilateral: Secondary | ICD-10-CM | POA: Diagnosis present

## 2022-08-07 DIAGNOSIS — N134 Hydroureter: Secondary | ICD-10-CM | POA: Diagnosis not present

## 2022-08-07 DIAGNOSIS — N136 Pyonephrosis: Secondary | ICD-10-CM | POA: Diagnosis not present

## 2022-08-07 DIAGNOSIS — Z1152 Encounter for screening for COVID-19: Secondary | ICD-10-CM | POA: Diagnosis not present

## 2022-08-07 DIAGNOSIS — Z7189 Other specified counseling: Secondary | ICD-10-CM | POA: Diagnosis not present

## 2022-08-07 DIAGNOSIS — E870 Hyperosmolality and hypernatremia: Secondary | ICD-10-CM | POA: Diagnosis not present

## 2022-08-07 DIAGNOSIS — I700 Atherosclerosis of aorta: Secondary | ICD-10-CM | POA: Diagnosis not present

## 2022-08-07 DIAGNOSIS — Z8249 Family history of ischemic heart disease and other diseases of the circulatory system: Secondary | ICD-10-CM

## 2022-08-07 DIAGNOSIS — E860 Dehydration: Secondary | ICD-10-CM | POA: Diagnosis present

## 2022-08-07 DIAGNOSIS — Z7401 Bed confinement status: Secondary | ICD-10-CM

## 2022-08-07 DIAGNOSIS — Z79899 Other long term (current) drug therapy: Secondary | ICD-10-CM

## 2022-08-07 DIAGNOSIS — Z515 Encounter for palliative care: Secondary | ICD-10-CM | POA: Diagnosis not present

## 2022-08-07 DIAGNOSIS — Z8673 Personal history of transient ischemic attack (TIA), and cerebral infarction without residual deficits: Secondary | ICD-10-CM

## 2022-08-07 DIAGNOSIS — J690 Pneumonitis due to inhalation of food and vomit: Secondary | ICD-10-CM | POA: Diagnosis not present

## 2022-08-07 DIAGNOSIS — A4150 Gram-negative sepsis, unspecified: Principal | ICD-10-CM | POA: Diagnosis present

## 2022-08-07 DIAGNOSIS — R0902 Hypoxemia: Secondary | ICD-10-CM | POA: Diagnosis not present

## 2022-08-07 DIAGNOSIS — Z8614 Personal history of Methicillin resistant Staphylococcus aureus infection: Secondary | ICD-10-CM

## 2022-08-07 DIAGNOSIS — Z743 Need for continuous supervision: Secondary | ICD-10-CM | POA: Diagnosis not present

## 2022-08-07 DIAGNOSIS — G9340 Encephalopathy, unspecified: Secondary | ICD-10-CM | POA: Diagnosis not present

## 2022-08-07 DIAGNOSIS — L8990 Pressure ulcer of unspecified site, unspecified stage: Secondary | ICD-10-CM | POA: Insufficient documentation

## 2022-08-07 DIAGNOSIS — N179 Acute kidney failure, unspecified: Secondary | ICD-10-CM | POA: Diagnosis not present

## 2022-08-07 DIAGNOSIS — E861 Hypovolemia: Secondary | ICD-10-CM | POA: Diagnosis present

## 2022-08-07 DIAGNOSIS — Z8541 Personal history of malignant neoplasm of cervix uteri: Secondary | ICD-10-CM

## 2022-08-07 DIAGNOSIS — Z818 Family history of other mental and behavioral disorders: Secondary | ICD-10-CM

## 2022-08-07 DIAGNOSIS — G9341 Metabolic encephalopathy: Secondary | ICD-10-CM | POA: Diagnosis present

## 2022-08-07 DIAGNOSIS — F03918 Unspecified dementia, unspecified severity, with other behavioral disturbance: Secondary | ICD-10-CM | POA: Diagnosis present

## 2022-08-07 DIAGNOSIS — G40909 Epilepsy, unspecified, not intractable, without status epilepticus: Secondary | ICD-10-CM | POA: Diagnosis present

## 2022-08-07 DIAGNOSIS — R531 Weakness: Secondary | ICD-10-CM | POA: Diagnosis not present

## 2022-08-07 DIAGNOSIS — N1330 Unspecified hydronephrosis: Secondary | ICD-10-CM | POA: Diagnosis not present

## 2022-08-07 DIAGNOSIS — N1832 Chronic kidney disease, stage 3b: Secondary | ICD-10-CM | POA: Diagnosis present

## 2022-08-07 DIAGNOSIS — R6889 Other general symptoms and signs: Secondary | ICD-10-CM | POA: Diagnosis not present

## 2022-08-07 DIAGNOSIS — R059 Cough, unspecified: Secondary | ICD-10-CM | POA: Diagnosis not present

## 2022-08-07 DIAGNOSIS — I6381 Other cerebral infarction due to occlusion or stenosis of small artery: Secondary | ICD-10-CM | POA: Diagnosis not present

## 2022-08-07 LAB — CBC WITH DIFFERENTIAL/PLATELET
Abs Immature Granulocytes: 0.32 10*3/uL — ABNORMAL HIGH (ref 0.00–0.07)
Basophils Absolute: 0.1 10*3/uL (ref 0.0–0.1)
Basophils Relative: 0 %
Eosinophils Absolute: 0.1 10*3/uL (ref 0.0–0.5)
Eosinophils Relative: 0 %
HCT: 37.9 % (ref 36.0–46.0)
Hemoglobin: 11.5 g/dL — ABNORMAL LOW (ref 12.0–15.0)
Immature Granulocytes: 1 %
Lymphocytes Relative: 7 %
Lymphs Abs: 2.4 10*3/uL (ref 0.7–4.0)
MCH: 30.7 pg (ref 26.0–34.0)
MCHC: 30.3 g/dL (ref 30.0–36.0)
MCV: 101.1 fL — ABNORMAL HIGH (ref 80.0–100.0)
Monocytes Absolute: 1.4 10*3/uL — ABNORMAL HIGH (ref 0.1–1.0)
Monocytes Relative: 4 %
Neutro Abs: 29.4 10*3/uL — ABNORMAL HIGH (ref 1.7–7.7)
Neutrophils Relative %: 88 %
Platelets: 301 10*3/uL (ref 150–400)
RBC: 3.75 MIL/uL — ABNORMAL LOW (ref 3.87–5.11)
RDW: 16.6 % — ABNORMAL HIGH (ref 11.5–15.5)
Smear Review: NORMAL
WBC: 33.7 10*3/uL — ABNORMAL HIGH (ref 4.0–10.5)
nRBC: 0 % (ref 0.0–0.2)

## 2022-08-07 LAB — TROPONIN I (HIGH SENSITIVITY): Troponin I (High Sensitivity): 18 ng/L — ABNORMAL HIGH (ref <18)

## 2022-08-07 LAB — LACTIC ACID, PLASMA: Lactic Acid, Venous: 1.7 mmol/L (ref 0.5–1.9)

## 2022-08-07 MED ORDER — SODIUM CHLORIDE 0.9 % IV SOLN
500.0000 mg | Freq: Once | INTRAVENOUS | Status: AC
  Administered 2022-08-08: 500 mg via INTRAVENOUS
  Filled 2022-08-07: qty 5

## 2022-08-07 MED ORDER — SODIUM CHLORIDE 0.9 % IV BOLUS
1000.0000 mL | Freq: Once | INTRAVENOUS | Status: AC
  Administered 2022-08-08: 1000 mL via INTRAVENOUS

## 2022-08-07 MED ORDER — SODIUM CHLORIDE 0.9 % IV SOLN
2.0000 g | Freq: Once | INTRAVENOUS | Status: AC
  Administered 2022-08-08: 2 g via INTRAVENOUS
  Filled 2022-08-07: qty 20

## 2022-08-07 NOTE — ED Provider Notes (Signed)
[facility] Provider Note    Event Date/Time   First MD Initiated Contact with Patient 08/07/22 2059     (approximate)   History   Dehydration and Altered Mental Status (Patient seen by primary care today for decreased PO intake per daughter. They told her patient was dehydrated and needed fluids. Patient has dementia at baseline, but daughter states she is more confused.)   HPI  [name] is a 81 y.o. female  with h/o dementia here with ams, decreased PO intake. Daughter reports that pt has been increasingly confused and had decreased PO intake over the past several days, and has been appearing "uncomfortable" and fidgeting. She has stopped eating/drinking much today. NP came to check on pt today and told them to come to the ED because she seemed dehydrated. Pt has also had a cough. No known fevers. Remainder of history limited 2/2 AMS.       Physical Exam   Triage Vital Signs: ED Triage Vitals  Enc Vitals Group     BP 08/07/22 2124 (!) 108/59     Pulse Rate 08/07/22 2037 99     Resp 08/07/22 2037 (!) 26     Temp 08/07/22 2037 98.8 F (37.1 C)     Temp Source 08/07/22 2037 Axillary     SpO2 08/07/22 2037 97 %     Weight 08/07/22 2116 193 lb 8 oz (87.8 kg)     Height --      Head Circumference --      Peak Flow --      Pain Score --      Pain Loc --      Pain Edu? --      Excl. in [place]? --     Most recent vital signs: Vitals:   08/07/22 2230 08/08/22 0119  BP: 109/75   Pulse: (!) 103 97  Resp: 19 (!) 23  Temp:  98.7 F (37.1 C)  SpO2: 95% 97%     General: Awake, no distress.  CV:  Good peripheral perfusion. RRR. No murmurs. Resp:  Normal effort. Lungs with bilateral rhonchi, occasional coughing. No wheezes.  Abd:  No distention.  Other:  Disoriented, opens eyes to painful stimuli but falls asleep. No focal deficits apparent.   ED Results / Procedures / Treatments   Labs (all labs ordered are listed, but only abnormal  results are displayed) Labs Reviewed  CBC WITH DIFFERENTIAL/PLATELET - Abnormal; Notable for the following components:      Result Value   WBC 33.7 (*)    RBC 3.75 (*)    Hemoglobin 11.5 (*)    MCV 101.1 (*)    RDW 16.6 (*)    Neutro Abs 29.4 (*)    Monocytes Absolute 1.4 (*)    Abs Immature Granulocytes 0.32 (*)    All other components within normal limits  TROPONIN I (HIGH SENSITIVITY) - Abnormal; Notable for the following components:   Troponin I (High Sensitivity) 18 (*)    All other components within normal limits  SARS CORONAVIRUS 2 BY RT PCR  CULTURE, BLOOD (ROUTINE X 2)  CULTURE, BLOOD (ROUTINE X 2)  LACTIC ACID, PLASMA  URINALYSIS, ROUTINE W REFLEX MICROSCOPIC  COMPREHENSIVE METABOLIC PANEL  PROCALCITONIN     EKG Sinus rhythm, VR 98. PR 154, QRS 90, QTc 412. No acute St elevations or depressions.   RADIOLOGY CXR: Left basilar infiltrate CT Head: NAICA   I also independently reviewed and agree with radiologist interpretations.  PROCEDURES:  Critical Care performed: No  MEDICATIONS ORDERED IN ED: Medications  sodium chloride 0.9 % bolus 1,000 mL (1,000 mLs Intravenous New Bag/Given 08/08/22 0005)  cefTRIAXone (ROCEPHIN) 2 g in sodium chloride 0.9 % 100 mL IVPB (0 g Intravenous Stopped 08/08/22 0030)  azithromycin (ZITHROMAX) 500 mg in sodium chloride 0.9 % 250 mL IVPB (500 mg Intravenous New Bag/Given 08/08/22 0039)  sodium chloride 0.9 % bolus 1,000 mL (1,000 mLs Intravenous New Bag/Given 08/08/22 0046)     IMPRESSION / MDM / ASSESSMENT AND PLAN / ED COURSE  I reviewed the triage vital signs and the nursing notes.                             Ddx:  Differential includes the following, with pertinent life- or limb-threatening emergencies considered:  Sepsis 2/2 UTI, PNA, CHF, ACS, medication effect, CVA or intracranial bleed/lesion  Patient's presentation is most consistent with acute presentation with potential threat to life or bodily  function.  MDM:  81 yo F with h/o dementia, here with AMS/decreased PO intake. Suspect acute encephalopathy in setting of likely UTI based on appearance of urine, possibly PNA given lung findings on exam, tachypnea, and inifltrate on CXR. CBC shows significant leukocytosis with left shift, and pt tachycardic. Will give fluids, send cultures and start on empiric coverage for PNA and possible UTI. No focal deficits. CT head is negative.Will admit to medicine. Daughter updated and is in agreement.   MEDICATIONS GIVEN IN ED: Medications  sodium chloride 0.9 % bolus 1,000 mL (1,000 mLs Intravenous New Bag/Given 08/08/22 0005)  cefTRIAXone (ROCEPHIN) 2 g in sodium chloride 0.9 % 100 mL IVPB (0 g Intravenous Stopped 08/08/22 0030)  azithromycin (ZITHROMAX) 500 mg in sodium chloride 0.9 % 250 mL IVPB (500 mg Intravenous New Bag/Given 08/08/22 0039)  sodium chloride 0.9 % bolus 1,000 mL (1,000 mLs Intravenous New Bag/Given 08/08/22 0046)     Consults:  Hospitalist   EMR reviewed       FINAL CLINICAL IMPRESSION(S) / ED DIAGNOSES   Final diagnoses:  Acute encephalopathy     Rx / DC Orders   ED Discharge Orders     None        Note:  This document was prepared using Dragon voice recognition software and may include unintentional dictation errors.   Duffy Bruce, MD 08/08/22 [phone]

## 2022-08-08 ENCOUNTER — Inpatient Hospital Stay: Payer: Medicare Other

## 2022-08-08 DIAGNOSIS — L8990 Pressure ulcer of unspecified site, unspecified stage: Secondary | ICD-10-CM | POA: Insufficient documentation

## 2022-08-08 DIAGNOSIS — A4150 Gram-negative sepsis, unspecified: Secondary | ICD-10-CM | POA: Diagnosis not present

## 2022-08-08 DIAGNOSIS — N179 Acute kidney failure, unspecified: Secondary | ICD-10-CM

## 2022-08-08 DIAGNOSIS — A419 Sepsis, unspecified organism: Secondary | ICD-10-CM | POA: Diagnosis present

## 2022-08-08 DIAGNOSIS — F03918 Unspecified dementia, unspecified severity, with other behavioral disturbance: Secondary | ICD-10-CM | POA: Diagnosis present

## 2022-08-08 DIAGNOSIS — E875 Hyperkalemia: Secondary | ICD-10-CM

## 2022-08-08 DIAGNOSIS — N390 Urinary tract infection, site not specified: Secondary | ICD-10-CM | POA: Diagnosis not present

## 2022-08-08 DIAGNOSIS — F0390 Unspecified dementia without behavioral disturbance: Secondary | ICD-10-CM | POA: Insufficient documentation

## 2022-08-08 LAB — URINALYSIS, ROUTINE W REFLEX MICROSCOPIC
Bilirubin Urine: NEGATIVE
Glucose, UA: NEGATIVE mg/dL
Ketones, ur: NEGATIVE mg/dL
Nitrite: NEGATIVE
Protein, ur: 100 mg/dL — AB
Specific Gravity, Urine: 1.018 (ref 1.005–1.030)
WBC, UA: >50 WBC/hpf — ABNORMAL HIGH (ref 0–5)
pH: 5 (ref 5.0–8.0)

## 2022-08-08 LAB — BASIC METABOLIC PANEL
Anion gap: 10 (ref 5–15)
BUN: 111 mg/dL — ABNORMAL HIGH (ref 8–23)
CO2: 18 mmol/L — ABNORMAL LOW (ref 22–32)
Calcium: 8.8 mg/dL — ABNORMAL LOW (ref 8.9–10.3)
Chloride: 117 mmol/L — ABNORMAL HIGH (ref 98–111)
Creatinine, Ser: 6.33 mg/dL — ABNORMAL HIGH (ref 0.44–1.00)
GFR, Estimated: 6 mL/min — ABNORMAL LOW (ref >60)
Glucose, Bld: 107 mg/dL — ABNORMAL HIGH (ref 70–99)
Potassium: 5 mmol/L (ref 3.5–5.1)
Sodium: 145 mmol/L (ref 135–145)

## 2022-08-08 LAB — COMPREHENSIVE METABOLIC PANEL
ALT: 149 U/L — ABNORMAL HIGH (ref 0–44)
AST: 121 U/L — ABNORMAL HIGH (ref 15–41)
Albumin: 2.5 g/dL — ABNORMAL LOW (ref 3.5–5.0)
Alkaline Phosphatase: 213 U/L — ABNORMAL HIGH (ref 38–126)
Anion gap: 13 (ref 5–15)
BUN: 107 mg/dL — ABNORMAL HIGH (ref 8–23)
CO2: 16 mmol/L — ABNORMAL LOW (ref 22–32)
Calcium: 8.7 mg/dL — ABNORMAL LOW (ref 8.9–10.3)
Chloride: 113 mmol/L — ABNORMAL HIGH (ref 98–111)
Creatinine, Ser: 7.55 mg/dL — ABNORMAL HIGH (ref 0.44–1.00)
GFR, Estimated: 5 mL/min — ABNORMAL LOW (ref >60)
Glucose, Bld: 90 mg/dL (ref 70–99)
Potassium: 5.5 mmol/L — ABNORMAL HIGH (ref 3.5–5.1)
Sodium: 142 mmol/L (ref 135–145)
Total Bilirubin: 1.7 mg/dL — ABNORMAL HIGH (ref 0.3–1.2)
Total Protein: 6.6 g/dL (ref 6.5–8.1)

## 2022-08-08 LAB — PROCALCITONIN: Procalcitonin: 10.07 ng/mL

## 2022-08-08 LAB — RENAL FUNCTION PANEL
Albumin: 2.8 g/dL — ABNORMAL LOW (ref 3.5–5.0)
Anion gap: 15 (ref 5–15)
BUN: 114 mg/dL — ABNORMAL HIGH (ref 8–23)
CO2: 16 mmol/L — ABNORMAL LOW (ref 22–32)
Calcium: 8.9 mg/dL (ref 8.9–10.3)
Chloride: 112 mmol/L — ABNORMAL HIGH (ref 98–111)
Creatinine, Ser: 7.83 mg/dL — ABNORMAL HIGH (ref 0.44–1.00)
GFR, Estimated: 5 mL/min — ABNORMAL LOW (ref >60)
Glucose, Bld: 95 mg/dL (ref 70–99)
Phosphorus: 5.7 mg/dL — ABNORMAL HIGH (ref 2.5–4.6)
Potassium: 5.9 mmol/L — ABNORMAL HIGH (ref 3.5–5.1)
Sodium: 143 mmol/L (ref 135–145)

## 2022-08-08 LAB — CBG MONITORING, ED
Glucose-Capillary: 99 mg/dL (ref 70–99)
Glucose-Capillary: 159 mg/dL — ABNORMAL HIGH (ref 70–99)

## 2022-08-08 LAB — POTASSIUM: Potassium: 5 mmol/L (ref 3.5–5.1)

## 2022-08-08 LAB — SARS CORONAVIRUS 2 BY RT PCR: SARS Coronavirus 2 by RT PCR: NEGATIVE

## 2022-08-08 MED ORDER — ALLOPURINOL 100 MG PO TABS
300.0000 mg | ORAL_TABLET | Freq: Every day | ORAL | Status: DC
  Filled 2022-08-08: qty 1
  Filled 2022-08-08: qty 3
  Filled 2022-08-08: qty 1

## 2022-08-08 MED ORDER — DICLOFENAC SODIUM 1 % EX GEL
4.0000 g | Freq: Four times a day (QID) | CUTANEOUS | Status: DC | PRN
  Administered 2022-08-10: 4 g via TOPICAL
  Filled 2022-08-08: qty 100

## 2022-08-08 MED ORDER — LOSARTAN POTASSIUM 50 MG PO TABS: 100.0000 mg | ORAL_TABLET | Freq: Every day | ORAL | Status: DC

## 2022-08-08 MED ORDER — LAMOTRIGINE 25 MG PO TABS
25.0000 mg | ORAL_TABLET | Freq: Two times a day (BID) | ORAL | Status: DC
  Administered 2022-08-08 – 2022-08-10 (×4): 25 mg via ORAL
  Filled 2022-08-08 (×5): qty 1

## 2022-08-08 MED ORDER — ALBUTEROL SULFATE (2.5 MG/3ML) 0.083% IN NEBU
10.0000 mg | INHALATION_SOLUTION | Freq: Once | RESPIRATORY_TRACT | Status: AC
  Administered 2022-08-08: 10 mg via RESPIRATORY_TRACT
  Filled 2022-08-08: qty 12

## 2022-08-08 MED ORDER — CALCIUM GLUCONATE 10 % IV SOLN
1.0000 g | Freq: Once | INTRAVENOUS | Status: AC
  Administered 2022-08-08: 1 g via INTRAVENOUS
  Filled 2022-08-08: qty 10

## 2022-08-08 MED ORDER — SODIUM CHLORIDE 0.9 % IV SOLN: INTRAVENOUS | Status: AC

## 2022-08-08 MED ORDER — BUPROPION HCL ER (XL) 150 MG PO TB24
300.0000 mg | ORAL_TABLET | Freq: Every morning | ORAL | Status: DC
  Administered 2022-08-08 – 2022-08-10 (×2): 300 mg via ORAL
  Filled 2022-08-08 (×2): qty 2

## 2022-08-08 MED ORDER — INSULIN ASPART 100 UNIT/ML IV SOLN
5.0000 [IU] | Freq: Once | INTRAVENOUS | Status: AC
  Administered 2022-08-08: 5 [IU] via INTRAVENOUS
  Filled 2022-08-08: qty 0.05

## 2022-08-08 MED ORDER — FLUTICASONE PROPIONATE 50 MCG/ACT NA SUSP
2.0000 | Freq: Every day | NASAL | Status: DC
  Filled 2022-08-08: qty 16

## 2022-08-08 MED ORDER — OLANZAPINE 2.5 MG PO TABS
2.5000 mg | ORAL_TABLET | Freq: Every day | ORAL | Status: DC
  Administered 2022-08-08 – 2022-08-09 (×2): 2.5 mg via ORAL
  Filled 2022-08-08 (×2): qty 1

## 2022-08-08 MED ORDER — DEXTROSE 50 % IV SOLN
1.0000 | Freq: Once | INTRAVENOUS | Status: AC
  Administered 2022-08-08: 50 mL via INTRAVENOUS
  Filled 2022-08-08: qty 50

## 2022-08-08 MED ORDER — SODIUM CHLORIDE 0.9 % IV SOLN
2.0000 g | INTRAVENOUS | Status: DC
  Administered 2022-08-08: 2 g via INTRAVENOUS
  Filled 2022-08-08 (×2): qty 20

## 2022-08-08 MED ORDER — VITAMIN C 500 MG PO TABS
500.0000 mg | ORAL_TABLET | Freq: Every day | ORAL | Status: DC
  Administered 2022-08-08 – 2022-08-10 (×2): 500 mg via ORAL
  Filled 2022-08-08 (×3): qty 1

## 2022-08-08 MED ORDER — CARVEDILOL 3.125 MG PO TABS
3.1250 mg | ORAL_TABLET | Freq: Two times a day (BID) | ORAL | Status: DC
  Administered 2022-08-08 – 2022-08-09 (×2): 3.125 mg via ORAL
  Filled 2022-08-08 (×4): qty 1

## 2022-08-08 MED ORDER — PANTOPRAZOLE SODIUM 40 MG PO TBEC
40.0000 mg | DELAYED_RELEASE_TABLET | Freq: Two times a day (BID) | ORAL | Status: DC
  Administered 2022-08-08: 40 mg via ORAL
  Filled 2022-08-08 (×2): qty 1

## 2022-08-08 MED ORDER — SODIUM CHLORIDE 0.9 % IV BOLUS
1000.0000 mL | Freq: Once | INTRAVENOUS | Status: AC
  Administered 2022-08-08: 1000 mL via INTRAVENOUS

## 2022-08-08 MED ORDER — AMLODIPINE BESYLATE 5 MG PO TABS: 5.0000 mg | ORAL_TABLET | Freq: Every day | ORAL | Status: DC

## 2022-08-08 MED ORDER — DONEPEZIL HCL 5 MG PO TABS
5.0000 mg | ORAL_TABLET | Freq: Every day | ORAL | Status: DC
  Administered 2022-08-08 – 2022-08-09 (×2): 5 mg via ORAL
  Filled 2022-08-08 (×2): qty 1

## 2022-08-08 MED ORDER — ALBUTEROL SULFATE (2.5 MG/3ML) 0.083% IN NEBU
INHALATION_SOLUTION | RESPIRATORY_TRACT | Status: AC
  Filled 2022-08-08: qty 9

## 2022-08-08 MED ORDER — ATORVASTATIN CALCIUM 20 MG PO TABS
40.0000 mg | ORAL_TABLET | Freq: Every day | ORAL | Status: DC
  Administered 2022-08-08: 40 mg via ORAL
  Filled 2022-08-08 (×2): qty 2

## 2022-08-08 MED ORDER — VITAMIN B-12 1000 MCG PO TABS
1000.0000 ug | ORAL_TABLET | Freq: Every day | ORAL | Status: DC
  Filled 2022-08-08 (×2): qty 1

## 2022-08-08 MED ORDER — ENOXAPARIN SODIUM 30 MG/0.3ML IJ SOSY: 30.0000 mg | PREFILLED_SYRINGE | INTRAMUSCULAR | Status: DC

## 2022-08-08 MED ORDER — HEPARIN SODIUM (PORCINE) 5000 UNIT/ML IJ SOLN
5000.0000 [IU] | Freq: Three times a day (TID) | INTRAMUSCULAR | Status: DC
  Administered 2022-08-08 – 2022-08-10 (×6): 5000 [IU] via SUBCUTANEOUS
  Filled 2022-08-08 (×6): qty 1

## 2022-08-08 MED ORDER — SODIUM ZIRCONIUM CYCLOSILICATE 10 G PO PACK
10.0000 g | PACK | Freq: Once | ORAL | Status: DC
  Filled 2022-08-08: qty 1

## 2022-08-08 NOTE — Evaluation (Signed)
Clinical/Bedside Swallow Evaluation Patient Details  Name: [name] MRN: [id number] Date of Birth: Nov 03, 1940  Today's Date: 08/08/2022 Time: SLP Start Time (ACUTE ONLY): 1450 SLP Stop Time (ACUTE ONLY): 1540 SLP Time Calculation (min) (ACUTE ONLY): 50 min  Past Medical History:  Past Medical History:  Diagnosis Date   CHF (congestive heart failure) (HCC)    Chronic venous insufficiency    Colon, diverticulosis 06/2010   outpatient colonoscopy by Dr. Cristina Gong.  Need record   DDD (degenerative disc disease), lumbosacral    Depression    DJD (degenerative joint disease), multiple sites    Low back pain worst   GERD (gastroesophageal reflux disease)    Gout    Gout    Uric Acid level 4.2 on 300 allopurinol   History of cervical cancer 1972   Hx-TIA (transient ischemic attack) 05/13/2001   Right facial numbness   Hx-TIA (transient ischemic attack) 05/13/2001   Looking back in Epic, the patient had a hospitalization in July 2002 for evaluation of questionable TIA (s/s of right facial numbness associated with mild blurred vision in her right eye, diaphoresis, and mild confusion) with negative CT head and negative MRI/MRA and was started on Plavix after being heparinized. She was to have possible follow up with Neurology for reevaluation of the need for Pl   Hyperlipidemia    Hypertension    Keloid 08/01/2018   Memory disorder 12/20/2016   Mitral valve prolapse 10/27/1999   Subsequent 2D echo show normal mitral valve   Osteoarthritis of hip    bilateral hips   Ovarian cancer ([place]) 1972   S/P oophorectomy   Psychosis ([place])    Recurrent boils    History of MRSA skin infections with abscess   Seizures ([place])    Sensorineural hearing loss of both ears    Subdural hematoma ([place]) 01/26/2017   bilateral   Uncomplicated opioid dependence ([place]) 01/31/2018   Past Surgical History:  Past Surgical History:  Procedure Laterality Date   ABDOMINAL HYSTERECTOMY     1972   BURR  HOLE Bilateral 02/12/2017   Procedure: Haskell Flirt;  Surgeon: Earnie Larsson, MD;  Location: [facility];  Service: Neurosurgery;  Laterality: Bilateral;   CERVICAL DISCECTOMY  7/07   C5-C6   JOINT REPLACEMENT     bilateral hip replacement   LUMBAR DISC SURGERY     L5-S1 7/07   LUMBAR LAMINECTOMY/DECOMPRESSION MICRODISCECTOMY N/A 02/02/2016   Procedure: L4-5 Decompression;  Surgeon: Marybelle Killings, MD;  Location: [facility];  Service: Orthopedics;  Laterality: N/A;   OOPHORECTOMY     1972 for ovarian cancer   HPI:  Pt  is a 81 y.o. female with h/o Multiple medical issues(see List) including Dementia arriving to the ED with ams, decreased PO intake. Daughter reports that pt has been increasingly confused and had decreased PO intake over the past several days, and has been appearing "uncomfortable" and fidgeting. She has stopped eating/drinking much today. NP came to check on pt today and told them to come to the ED because she seemed dehydrated.  Pt was recently on Hospice services; family revoked this per chart notes and changed code status to FULL CODE.  Pt has exhibited a drowsy, lethargic presentation w/ difficulty awaking to take po meds per NSG report today.  CXR: Low lung volumes with mild left lung base atelectasis.  Head CT w/ no acute changes.    Assessment / Plan / Recommendation  Clinical Impression   Pt seen for BSE this afternoon.  NSG reported drowsiness earlier and inability to take her oral medication safely/appropriately. Pt presented w/ significant drowsiness and lethargy -- suspect impact of declining Cognitive status/Dementia and current acute illness. Pt was not fully arousable to MOD+ verbal/tactile stim but did root/open mouth slightly to oral stim at lips. Eyes remained closed during the session. Nonverbal and did not follow commands. Appeared reactive to tactile stim; defensive to oral stim of swabs for attempted oral care. On RA; afebrile.   Pt appears to present w/ oropharyngeal phase  dysphagia in setting of declined Cognitive status; Baseline Dementia. Family has reported a decline in pt's presentation and oral intake in recent weeks. ANY Cognitive decline can impact her overall awareness/timing of swallow and safety during po tasks which increases risk for aspiration, choking. Pt's risk for aspiration/aspiration pneumonia in setting of Lethargy and decreased engagement in po tasks is HIGH, and an oral diet cannot be recommended at this time.        Pt was presented w/ tsp/cup trials of water, juice at her lips after attempted oral care(defensive reactions to the oral care noted). She rooted and opened mouth to accept ~10 trials of thin liquids. Post lingual smacking and min time, a pharyngeal swallow followed. This same presentation was noted w/ 1/4 tsp trials of puree(applesauce). MOD-MAX verbal/tactile cues were given during po trials to encouraged intake and swallowing overall.  No overt clinical s/s of aspiration noted w/ these trials given: no decline in respiratory status during/post trials, no cough. O2 sats remained mid 90s during trials. Oral phase was c/b decreased awareness and attention to the boluses w/ increased time for bolus management and oral clearing. Difficult to determine full oral clearing d/t Cognitive decline(would not open mouth for check). OM Exam appeared to reveal overall weakness; min white coating on anterior tongue noted. Confusion of oral care noted.         In setting of baseline Dementia and worsening Cognitive decline, her risk for aspiration is too HIGH for recommendation of oral diet. Recommend NPO status w/ frequent oral care. Alternative means for medications. ST services will f/u w/ pt's status next 1-2 days for improvement and appropriateness for po trials again. MD/NSG updated.  ST services recommends follow w/ Palliative Care for [place] and education re: impact of Cognitive decline/Dementia on swallowing.  SLP Visit Diagnosis: Dysphagia,  oropharyngeal phase (R13.12) (declined Cognitive status/function)    Aspiration Risk  Severe aspiration risk;Risk for inadequate nutrition/hydration    Diet Recommendation   NPO   Medication Administration: Via alternative means    Other  Recommendations Recommended Consults:  (Palliative Care; dietician) Oral Care Recommendations: Oral care QID;Staff/trained caregiver to provide oral care Other Recommendations:  (TBD)    Recommendations for follow up therapy are one component of a multi-disciplinary discharge planning process, led by the attending physician.  Recommendations may be updated based on patient status, additional functional criteria and insurance authorization.  Follow up Recommendations Follow physician's recommendations for discharge plan and follow up therapies (TBD; Hospice services previously)      Assistance Recommended at Discharge Frequent or constant Supervision/Assistance  Functional Status Assessment Patient has had a recent decline in their functional status and/or demonstrates limited ability to make significant improvements in function in a reasonable and predictable amount of time  Frequency and Duration min 2x/week  2 weeks       Prognosis Prognosis for Safe Diet Advancement: Guarded Barriers to Reach Goals: Cognitive deficits;Language deficits;Time post onset;Severity of deficits;Behavior Barriers/Prognosis Comment: Dementia, decline in  status/functioning      Swallow Study   General Date of Onset: 08/07/22 HPI: Pt  is a 81 y.o. female with h/o multiple medical issues including Dementia arriving to the ED with ams, decreased PO intake. Daughter reports that pt has been increasingly confused and had decreased PO intake over the past several days, and has been appearing "uncomfortable" and fidgeting. She has stopped eating/drinking much today. NP came to check on pt today and told them to come to the ED because she seemed dehydrated.  Pt was recently on  Hospice services; family revoked this per chart notes and changed code status to FULL CODE.  Pt has exhibited a drowsy, lethargic presentation w/ difficulty awaking to take po meds per NSG report today.  CXR: Low lung volumes with mild left lung base atelectasis.  Head CT w/ no acute changes. Type of Study: Bedside Swallow Evaluation Previous Swallow Assessment: none Diet Prior to this Study: NPO (no diet order per MD) Temperature Spikes Noted: No (wbc elevated) Respiratory Status: Room air History of Recent Intubation: No Behavior/Cognition: Lethargic/Drowsy;Doesn't follow directions;Requires cueing (roots/responds to oral stim) Oral Cavity Assessment: Dry (viewable tongue appeared to have a white coating?) Oral Care Completed by SLP: Yes (attempted - defensive) Oral Cavity - Dentition: Edentulous Vision:  (n/a) Self-Feeding Abilities: Total assist Patient Positioning: Upright in bed (full positioning support given) Baseline Vocal Quality:  (nonverbal) Volitional Cough: Cognitively unable to elicit Volitional Swallow: Unable to elicit    Oral/Motor/Sensory Function Overall Oral Motor/Sensory Function: Generalized oral weakness   Ice Chips Ice chips: Not tested   Thin Liquid Thin Liquid: Impaired Presentation: Spoon;Cup (at lips; 8 trials) Oral Phase Impairments: Poor awareness of bolus Oral Phase Functional Implications:  (spillage) Pharyngeal  Phase Impairments:  (lingual smacking then swallowing w/ trials given) Other Comments: MOD-MAX cues    Nectar Thick Nectar Thick Liquid: Not tested   Honey Thick Honey Thick Liquid: Not tested   Puree Puree: Impaired Presentation: Spoon (fed; 5 trials) Oral Phase Impairments: Poor awareness of bolus;Reduced lingual movement/coordination Oral Phase Functional Implications:  (did not pull bolus from spoon fully) Pharyngeal Phase Impairments:  (same as w/ liquids) Other Comments: MOD-MAX cues   Solid     Solid: Not tested        Orinda Kenner, MS, CCC-SLP Speech Language Pathologist Rehab Services; [place] [phone] (ascom)  Jowell Bossi 08/08/2022,5:41 PM

## 2022-08-08 NOTE — ED Notes (Signed)
Pt started falling asleep while taking medications.  Pt with one mild coughing fit after medications. Dr. Reesa Chew notified and speech eval ordered.

## 2022-08-08 NOTE — ED Notes (Addendum)
This RN requested MD to bedside to discuss plan of care and goals of therapy with pts daughter. Dr. Reesa Chew states she is no longer on campus and to consult social work and palliative care. Social work states she will see the pt in the morning, palliative care is no longer in house. MD notified of this and asked again to come to bedside and/or if there is a physician on campus covering her. MD states she will put in DNR and DNI orders but not comfort care orders.

## 2022-08-08 NOTE — Progress Notes (Signed)
[facility] ([facility])  Plan was for patient to be admitted at home with [facility] hospice services. However, patient was transferred to the ED prior to admission.    Patient is not an active patient with [facility] services. [facility] will continue to follow for any discharge planning needs and to coordinate continuation of hospice care.     Please call with any questions/concerns.    Thank you for the opportunity to participate in this patient's care.   Shania Daniel, MSW [facility] Hospital Liaison  [phone]   

## 2022-08-08 NOTE — Assessment & Plan Note (Signed)
-   We will continue Aricept and Zyprexa and Namenda.

## 2022-08-08 NOTE — Assessment & Plan Note (Signed)
-   This is clearly secondary to her sepsis and underlying UTI and possible pneumonia. - We will monitor mental status with above management.

## 2022-08-08 NOTE — Progress Notes (Signed)
Progress Note   Patient: [name] GGY:[id number] DOB: 07/16/1941 DOA: 08/07/2022     1 DOS: the patient was seen and examined on 08/08/2022   Brief hospital course: Taken from H&P.  [name] is a 81 y.o. African-American female with medical history significant for CHF, diverticulosis, GERD, gout, TIA, hypertension and dyslipidemia as well as seizure disorder, who presented to the emergency room with acute onset of altered mental status with confusion as well as significant diminished appetite.  The patient is demented and therefore most of the history was obtained from her daughter.  She has been having dry cough without wheezing or dyspnea.  The symptoms started on Saturday night.  It is hard to say if she had any urinary symptoms including frequency or urgency or dysuria or flank pain.  No chest pain or palpitations were reported.  ED course.  On arrival she was afebrile with mild tachypnea and tachycardia. Soft blood pressure at 93/57.  Labs pertinent for leukocytosis of 03.5 with neutrophilic predominance.  CMP with potassium of 5.5, bicarb of 16, BUN 107, creatinine 7.55 with baseline around 1.2-1.4.  AST 121, ALT 149, alkaline phosphatase 213, T. bili 1.7.  Troponin at 18.  Lactic acid 1.7 COVID-19 PCR negative.  Procalcitonin at 10.07.  UA with significant leukocytosis and bacteriuria. CXR with low lung volumes and mild left lung base atelectasis. CT head was without any acute intracranial abnormality  10/10: Remained afebrile.  Significant AKI and mild hyperkalemia. Added urine culture as add-on.  Preliminary blood cultures negative in less than 12-hour reporting. CT abdomen with markedly distended bladder and bilateral hydronephrosis, thickening of bladder with some diverticular appearance-underlying mass cannot be excluded.  Stranding and some thickening of intestinal wall-possible ileus.  Foley catheter was placed with removal of very cloudy urine.  Renal ultrasound  which was done after placing Foley catheter with improvement in hydronephrosis.  Medical renal disease. Blood pressure on softer side-Home antihypertensives for placed on hold. Repeat BMP with worsening hyperkalemia, calcium gluconate, albuterol and insulin with dextrose ordered as patient with decreased responsiveness and encephalopathy.  Lokelma was also ordered but she was unable to take much p.o. at this time. Nephrology was consulted.  Had a long discussion with daughter on phone.  She wants to keep her full code.  She was a hospice patient before and daughter revoked that status. Patient had last urinary output on Sunday per daughter.  Not eating or drinking for the past few days.  Patient will be very high risk for deterioration and mortality, based on underlying comorbidities and advanced age.  Palliative care was also consulted.    Assessment and Plan: * Sepsis due to gram-negative UTI [facility]) Patient met sepsis criteria with leukocytosis, tachycardia, tachypnea, severe sepsis with manifestations of endorgan damage at acute encephalopathy and AKI. Procalcitonin elevated at 10.07. Concern of UTI.  Blood and urine cultures pending. -Continue with ceftriaxone -Follow-up cultures  Acute metabolic encephalopathy - This is clearly secondary to her sepsis and underlying UTI and possible pneumonia. - We will monitor mental status with above management.  Hyperkalemia Worsening potassium, at 5.9 on repeat.  Most likely secondary to AKI. Unable to take [facility] due to decreased responsiveness. -Ordered calcium gluconate, albuterol and insulin with dextrose. -Nephrology consult  AKI (acute kidney injury) ([place]) Significantly elevated BUN and creatinine at 107/7.85 with baseline around 1.2.  Bicarb low at 16.  Potassium 5.9. CT abdomen with concern of markedly distended bladder and bilateral hydronephrosis, underlying mass cannot be  excluded.  Foley catheter was placed. Renal ultrasound also  concerning for medical renal disease. -Nephrology consult -Monitor renal function -Avoid nephrotoxins  Dementia with behavioral disturbance ([place]) - We will continue Aricept and Zyprexa and Namenda.  Dyslipidemia - We will continue statin therapy.  GERD - We will can continue PPI therapy  Essential hypertension Blood pressure currently soft. -Holding home antihypertensives   Gout - We will continue allopurinol.    Subjective: Patient was seen and examined in ED today.  Remained encephalopathic and very lethargic, just open eyes momentarily with deep sternal rub.  Not following any other commands.    Physical Exam: Vitals:   08/08/22 1051 08/08/22 1100 08/08/22 1200 08/08/22 1400  BP: (!) 105/54 97/69 107/60 115/60  Pulse: 96 95 86 85  Resp: (!) 21 (!) 24 (!) 28 (!) 26  Temp: 98.6 F (37 C) 98.5 F (36.9 C) 98.2 F (36.8 C) 97.9 F (36.6 C)  TempSrc: Bladder  Bladder Bladder  SpO2: 97% 95% 93% 96%  Weight:       General.  Very lethargic and somnolent elderly lady, hardly open eyes on deep sternal rub, not following any commands. Pulmonary.  Lungs clear bilaterally, normal respiratory effort. CV.  Regular rate and rhythm, no JVD, rub or murmur. Abdomen.  Soft, nontender, nondistended, BS positive. CNS.  Lethargic and not following any commands. Extremities.  No edema, no cyanosis, pulses intact and symmetrical. Psychiatry.  Judgment and insight appears impaired.  Data Reviewed:  Prior data reviewed  Family Communication: Discussed with daughter on phone  Disposition: Status is: Inpatient Remains inpatient appropriate because: Severity of illness   Planned Discharge Destination: Home with Home Health  DVT prophylaxis.  Heparin Time spent: 50 minutes  This record has been created using Systems analyst. Errors have been sought and corrected,but may not always be located. Such creation errors do not reflect on the standard of  care.  Author: Lorella Nimrod, MD 08/08/2022 3:18 PM  For on call review [email or web address].

## 2022-08-08 NOTE — Assessment & Plan Note (Signed)
-   We will continue allopurinol 

## 2022-08-08 NOTE — ED Notes (Addendum)
Pts daughter called nurse to bedside and stated that she understands her mother might not make it through this illness and hospitilization. Pts daughter states that she would like to make her mother DNR/DNI at this time. This RN reiterated that if the pts heart were to stop and/or her breathing were to stop the hospital staff would not intervene. Pts daughter states "yes, that's what I want." MD notified.

## 2022-08-08 NOTE — Assessment & Plan Note (Addendum)
Patient met sepsis criteria with leukocytosis, tachycardia, tachypnea, severe sepsis with manifestations of endorgan damage at acute encephalopathy and AKI. Procalcitonin elevated at 10.07. Concern of UTI.  Blood and urine cultures pending. -Continue with ceftriaxone -Follow-up cultures

## 2022-08-08 NOTE — ED Notes (Signed)
MD notified of pts blood pressure

## 2022-08-08 NOTE — ED Notes (Signed)
O2 83% at rest, encouraged pt to take deep breaths...SaO2 increased to 89% with effort of breathing correctly.  As soon as pt relaxed and breathed normal SaO2 dropped to 85%.  O2 at 2lpm via [place] provided.

## 2022-08-08 NOTE — Hospital Course (Addendum)
Taken from H&P.  [name] is a 81 y.o. African-American female with medical history significant for CHF, diverticulosis, GERD, gout, TIA, hypertension and dyslipidemia as well as seizure disorder, who presented to the emergency room with acute onset of altered mental status with confusion as well as significant diminished appetite.  The patient is demented and therefore most of the history was obtained from her daughter.  She has been having dry cough without wheezing or dyspnea.  The symptoms started on Saturday night.  It is hard to say if she had any urinary symptoms including frequency or urgency or dysuria or flank pain.  No chest pain or palpitations were reported.  ED course.  On arrival she was afebrile with mild tachypnea and tachycardia. Soft blood pressure at 93/57.  Labs pertinent for leukocytosis of 78.2 with neutrophilic predominance.  CMP with potassium of 5.5, bicarb of 16, BUN 107, creatinine 7.55 with baseline around 1.2-1.4.  AST 121, ALT 149, alkaline phosphatase 213, T. bili 1.7.  Troponin at 18.  Lactic acid 1.7 COVID-19 PCR negative.  Procalcitonin at 10.07.  UA with significant leukocytosis and bacteriuria. CXR with low lung volumes and mild left lung base atelectasis. CT head was without any acute intracranial abnormality  10/10: Remained afebrile.  Significant AKI and mild hyperkalemia. Added urine culture as add-on.  Preliminary blood cultures negative in less than 12-hour reporting. CT abdomen with markedly distended bladder and bilateral hydronephrosis, thickening of bladder with some diverticular appearance-underlying mass cannot be excluded.  Stranding and some thickening of intestinal wall-possible ileus.  Foley catheter was placed with removal of very cloudy urine.  Renal ultrasound which was done after placing Foley catheter with improvement in hydronephrosis.  Medical renal disease. Blood pressure on softer side-Home antihypertensives for placed on  hold. Repeat BMP with worsening hyperkalemia, calcium gluconate, albuterol and insulin with dextrose ordered as patient with decreased responsiveness and encephalopathy.  Lokelma was also ordered but she was unable to take much p.o. at this time. Nephrology was consulted.  Had a long discussion with daughter on phone.  She wants to keep her full code.  She was a hospice patient before and daughter revoked that status. Patient had last urinary output on Sunday per daughter.  Not eating or drinking for the past few days.  Patient will be very high risk for deterioration and mortality, based on underlying comorbidities and advanced age.  Palliative care was also consulted.  Daughter later decided to proceed with DNR with full scope of medical care at this time.  CODE STATUS was changed. Daughter would like to see her for next couple of days and if deteriorating or shows no improvement then they might decide about comfort care.  10/11: Patient with improved mental status, more alert.  Had 1 unwitnessed large emesis overnight.  Little worsening cough this morning, repeat chest x-ray with worsening of right lower lobe infiltrate-concerning for aspiration pneumonia. Urine cultures with multiple species-asking for a repeat but patient is already on antibiotics for more than 24 hours. 2 urine cultures from earlier this year, 1 with E. coli and 1 with Proteus mirabilis both with good sensitivity.   Blood cultures remain negative. Potassium normalized and renal functions improving. Mild hypernatremia-giving some hypertonic saline with D5. Switch antibiotics to Unasyn to cover for aspiration pneumonia. Add Zithromax

## 2022-08-08 NOTE — Assessment & Plan Note (Signed)
-   We will can continue PPI therapy. 

## 2022-08-08 NOTE — ED Notes (Signed)
Upon cleaning pt, several small abbrasions noted to pts hips and upper thighs. Pt skin cleaned, no dressing applied.

## 2022-08-08 NOTE — Assessment & Plan Note (Addendum)
Blood pressure currently soft. -Holding home antihypertensives

## 2022-08-08 NOTE — Assessment & Plan Note (Signed)
-   We will continue statin therapy. 

## 2022-08-08 NOTE — ED Notes (Signed)
Pt given bed bath and changed into clean gown. Pt transferred onto hospital bed with new bedding.

## 2022-08-08 NOTE — H&P (Signed)
[place]   PATIENT NAME: [name]    MR#:  [id number]  DATE OF BIRTH:  1941-07-06  DATE OF ADMISSION:  08/07/2022  PRIMARY CARE PHYSICIAN: Vivi Barrack, MD   Patient is coming from: Home  REQUESTING/REFERRING PHYSICIAN: Duffy Bruce, MD  CHIEF COMPLAINT:   Chief Complaint  Patient presents with   Dehydration   Altered Mental Status    Patient seen by primary care today for decreased PO intake per daughter. They told her patient was dehydrated and needed fluids. Patient has dementia at baseline, but daughter states she is more confused.    HISTORY OF PRESENT ILLNESS:  [name] is a 81 y.o. African-American female with medical history significant for CHF, diverticulosis, GERD, gout, TIA, hypertension and dyslipidemia as well as seizure disorder, who presented to the emergency room with acute onset of altered mental status with confusion as well as significant diminished appetite.  The patient is demented and therefore is a very poor historian so most of the history was obtained from her daughter.  She has been having dry cough without wheezing or dyspnea.  The symptoms started on Saturday night.  It is hard to say if she had any urinary symptoms including frequency or urgency or dysuria or flank pain.  No chest pain or palpitations were reported.  ED Course: Upon presentation to the ER, respiratory it was 26 and blood pressure 108/59 with a heart rate of 102.  Later on BP was down to 93/57 with map of 63.Labs revealed leukocytosis of 33.7 with neutrophilia and bandemia, anemia worse than previous levels and macrocytosis..  CMP is pending.  High sensitive troponin I was 18. EKG as reviewed by me : EKG showed no sinus rhythm with a rate of 98 with low voltage QRS.  Imaging: Portable chest ray showed low lung volumes with mild left lung basal atelectasis.  The patient was given 2 g of IV Rocephin, 500 mg of IV Zithromax and 2 L bolus of IV normal saline.  She  will be admitted to a medical telemetry bed for further evaluation and management. PAST MEDICAL HISTORY:   Past Medical History:  Diagnosis Date   CHF (congestive heart failure) (HCC)    Chronic venous insufficiency    Colon, diverticulosis 06/2010   outpatient colonoscopy by Dr. Cristina Gong.  Need record   DDD (degenerative disc disease), lumbosacral    Depression    DJD (degenerative joint disease), multiple sites    Low back pain worst   GERD (gastroesophageal reflux disease)    Gout    Gout    Uric Acid level 4.2 on 300 allopurinol   History of cervical cancer 1972   Hx-TIA (transient ischemic attack) 05/13/2001   Right facial numbness   Hx-TIA (transient ischemic attack) 05/13/2001   Looking back in Epic, the patient had a hospitalization in July 2002 for evaluation of questionable TIA (s/s of right facial numbness associated with mild blurred vision in her right eye, diaphoresis, and mild confusion) with negative CT head and negative MRI/MRA and was started on Plavix after being heparinized. She was to have possible follow up with Neurology for reevaluation of the need for Pl   Hyperlipidemia    Hypertension    Keloid 08/01/2018   Memory disorder 12/20/2016   Mitral valve prolapse 10/27/1999   Subsequent 2D echo show normal mitral valve   Osteoarthritis of hip    bilateral hips   Ovarian cancer ([place]) 1972  S/P oophorectomy   Psychosis ([place])    Recurrent boils    History of MRSA skin infections with abscess   Seizures ([place])    Sensorineural hearing loss of both ears    Subdural hematoma ([place]) 01/26/2017   bilateral   Uncomplicated opioid dependence ([employer]) 01/31/2018    PAST SURGICAL HISTORY:   Past Surgical History:  Procedure Laterality Date   ABDOMINAL HYSTERECTOMY     1971-01-24   BURR HOLE Bilateral 02/12/2017   Procedure: Haskell Flirt;  Surgeon: Earnie Larsson, MD;  [facility];  Service: Neurosurgery;  Laterality: Bilateral;   CERVICAL DISCECTOMY  7/07   C5-C6    JOINT REPLACEMENT     bilateral hip replacement   LUMBAR DISC SURGERY     L5-S1 7/07   LUMBAR LAMINECTOMY/DECOMPRESSION MICRODISCECTOMY N/A 02/02/2016   Procedure: L4-5 Decompression;  Surgeon: Marybelle Killings, MD;  Location: [place];  Service: Orthopedics;  Laterality: N/A;   OOPHORECTOMY     1972 for ovarian cancer    SOCIAL HISTORY:   Social History   Tobacco Use   Smoking status: Former    Packs/day: 0.10    Years: 35.00    Total pack years: 3.50    Types: Cigarettes    Quit date: 04/08/2013    Years since quitting: 9.3   Smokeless tobacco: Never   Tobacco comments:    smoked a few when her son died in 2013/01/23   Substance Use Topics   Alcohol use: No    Alcohol/week: 0.0 standard drinks of alcohol    FAMILY HISTORY:   Family History  Problem Relation Age of Onset   Diabetes Mother    Heart disease Mother    Hearing loss Mother    Stroke Mother    Post-traumatic stress disorder Father    Schizophrenia Maternal Grandmother    Diabetes Maternal Grandfather    Other Brother        brother died in mental health hospital   Kidney cancer Neg Hx    Bladder Cancer Neg Hx     DRUG ALLERGIES:   Allergies  Allergen Reactions   Aspirin Other (See Comments)    Bleeding from vagina    Sulfamethoxazole-Trimethoprim Itching    REVIEW OF SYSTEMS:   ROS As per history of present illness, otherwise unobtainable due to the patient's dementia.  MEDICATIONS AT HOME:   Prior to Admission medications   Medication Sig Start Date End Date Taking? Authorizing Provider  acetaminophen (TYLENOL) 500 MG tablet '500mg'$  at night and every 6 hours as needed. 01-23-2022   Vivi Barrack, MD  allopurinol (ZYLOPRIM) 300 MG tablet TAKE 1 TABLET BY MOUTH  DAILY 03/13/22   Vivi Barrack, MD  amLODipine (NORVASC) 5 MG tablet Take 1 tablet (5 mg total) by mouth daily. 07/12/22   Freada Bergeron, MD  ascorbic acid (VITAMIN C) 500 MG tablet Take 500 mg by mouth daily.    [provider]   atorvastatin (LIPITOR) 40 MG tablet TAKE 1 TABLET BY MOUTH  DAILY 02/17/22   Vivi Barrack, MD  buPROPion (WELLBUTRIN XL) 300 MG 24 hr tablet Take 1 tablet (300 mg total) by mouth every morning. 03/28/22   Arfeen, Arlyce Harman, MD  carvedilol (COREG) 3.125 MG tablet Take 1 tablet (3.125 mg total) by mouth 2 (two) times daily with a meal. 02/07/22   Freada Bergeron, MD  Cyanocobalamin (VITAMIN B12) 1000 MCG TBCR Take 1 tablet by mouth daily.    [provider]  diclofenac Sodium (VOLTAREN) 1 % GEL Apply 4 g topically 4 (four) times daily as needed (for pain).    [provider]  donepezil (ARICEPT) 5 MG tablet Take 1 tablet (5 mg total) by mouth at bedtime. 07/25/22 07/25/23  Cameron Sprang, MD  fluticasone Asencion Islam) 50 MCG/ACT nasal spray Place into both nostrils as needed for allergies or rhinitis.    [provider]  ketoconazole (NIZORAL) 2 % cream Apply 1 application. topically 2 (two) times daily as needed for irritation. 01/10/22   Vivi Barrack, MD  lamoTRIgine (LAMICTAL) 25 MG tablet Take 2 tablets twice a day 07/25/22   Cameron Sprang, MD  losartan (COZAAR) 100 MG tablet TAKE 1 TABLET BY MOUTH  DAILY 02/17/22   Vivi Barrack, MD  nystatin (MYCOSTATIN/NYSTOP) powder Apply 1 application. topically 3 (three) times daily as needed (yeast infection). 01/10/22   Vivi Barrack, MD  OLANZapine [facility]) 2.5 MG tablet Take 1 tab at bedtime 03/28/22   Arfeen, Arlyce Harman, MD  pantoprazole (PROTONIX) 40 MG tablet TAKE 1 TABLET BY MOUTH TWICE  DAILY BEFORE MEALS 05/23/22   Vivi Barrack, MD      VITAL SIGNS:  Blood pressure 109/75, pulse 97, temperature 98.7 F (37.1 C), temperature source Oral, resp. rate (!) 23, weight 87.8 kg, SpO2 97 %.  PHYSICAL EXAMINATION:  Physical Exam  GENERAL:  81 y.o.-year-old African-American female patient lying in the bed with no acute distress.  EYES: Pupils equal, round, reactive to light and accommodation. No scleral icterus. Extraocular  muscles intact.  HEENT: Head atraumatic, normocephalic. Oropharynx with mucous membrane and tongue and nasopharynx clear.  NECK:  Supple, no jugular venous distention. No thyroid enlargement, no tenderness.  LUNGS: Soft diminished left basal breath sounds.  No use of accessory muscles of respiration.  CARDIOVASCULAR: Regular rate and rhythm, S1, S2 normal. No murmurs, rubs, or gallops.  ABDOMEN: Soft, nondistended, nontender. Bowel sounds present. No organomegaly or mass.  EXTREMITIES: No pedal edema, cyanosis, or clubbing.  NEUROLOGIC: Cranial nerves II through XII are intact. Muscle strength 5/5 in all extremities. Sensation intact. Gait not checked.  PSYCHIATRIC: The patient is globally confused not back to her baseline with dementia.  No good eye contact.   SKIN: No obvious rash, lesion, or ulcer.   LABORATORY PANEL:   CBC Recent Labs  Lab 08/07/22 2109  WBC 33.7*  HGB 11.5*  HCT 37.9  PLT 301   ------------------------------------------------------------------------------------------------------------------  Chemistries  No results for input(s): "NA", "K", "CL", "CO2", "GLUCOSE", "BUN", "CREATININE", "CALCIUM", "MG", "AST", "ALT", "ALKPHOS", "BILITOT" in the last 168 hours.  Invalid input(s): "GFRCGP" ------------------------------------------------------------------------------------------------------------------  Cardiac Enzymes No results for input(s): "TROPONINI" in the last 168 hours. ------------------------------------------------------------------------------------------------------------------  RADIOLOGY:  CT HEAD WO CONTRAST (5MM)  Result Date: 08/07/2022 CLINICAL DATA:  Altered mental status, dehydration, dementia EXAM: CT HEAD WITHOUT CONTRAST TECHNIQUE: Contiguous axial images were obtained from the base of the skull through the vertex without intravenous contrast. RADIATION DOSE REDUCTION: This exam was performed according to the departmental dose-optimization  program which includes automated exposure control, adjustment of the mA and/or kV according to patient size and/or use of iterative reconstruction technique. COMPARISON:  MRI brain dated 03/29/2021 FINDINGS: Motion degraded images. Brain: No evidence of acute infarction, hemorrhage, hydrocephalus, extra-axial collection or mass lesion/mass effect. Subcortical white matter and periventricular small vessel ischemic changes. Old right thalamic lacunar infarct. Vascular: Intracranial atherosclerosis. Skull: Normal. Negative for fracture or focal lesion. Bilateral frontal burr holes. Sinuses/Orbits: The  visualized paranasal sinuses are essentially clear. The mastoid air cells are unopacified. Other: None. IMPRESSION: Motion degraded images. No evidence of acute intracranial abnormality. Small vessel ischemic changes. Old right thalamic lacunar infarct. Electronically Signed   By: Julian Hy M.D.   On: 08/07/2022 23:18   DG Chest Portable 1 View  Result Date: 08/07/2022 CLINICAL DATA:  Weakness. Altered mental status. EXAM: PORTABLE CHEST 1 VIEW COMPARISON:  Chest radiograph and CT 03/26/2021 FINDINGS: Patient is rotated. The heart is grossly stable in size. There is aortic tortuosity. Low lung volumes with mild left lung base atelectasis. No pneumothorax or pleural effusion. No pulmonary edema. IMPRESSION: Low lung volumes with mild left lung base atelectasis. Electronically Signed   By: Keith Rake M.D.   On: 08/07/2022 23:05      IMPRESSION AND PLAN:  Assessment and Plan: * Sepsis due to gram-negative UTI [facility]) - The patient will be admitted to a medical telemetry bed. - We will continue antibiotic therapy with IV Rocephin and add IV Zithromax for the possibility of underlying pneumonia. - We will follow blood and urine cultures. - We will continue hydration with IV normal saline. - Sepsis manifested by leukocytosis and tachycardia and tachypnea.  Acute metabolic encephalopathy - This is  clearly secondary to her sepsis and underlying UTI and possible pneumonia. - We will monitor mental status with above management.  Dementia with behavioral disturbance ([place]) - We will continue Aricept and Zyprexa and Namenda.  Dyslipidemia - We will continue statin therapy.  GERD - We will can continue PPI therapy  Essential hypertension - We will continue her antihypertensives.   Gout - We will continue allopurinol.   DVT prophylaxis: Lovenox.  Advanced Care Planning:  Code Status: full code.  Family Communication:  The plan of care was discussed in details with the patient (and family). I answered all questions. The patient agreed to proceed with the above mentioned plan. Further management will depend upon hospital course. Disposition Plan: Back to previous home environment Consults called: none.  All the records are reviewed and case discussed with ED provider.  Status is: Inpatient   At the time of the admission, it appears that the appropriate admission status for this patient is inpatient.  This is judged to be reasonable and necessary in order to provide the required intensity of service to ensure the patient's safety given the presenting symptoms, physical exam findings and initial radiographic and laboratory data in the context of comorbid conditions.  The patient requires inpatient status due to high intensity of service, high risk of further deterioration and high frequency of surveillance required.  I certify that at the time of admission, it is my clinical judgment that the patient will require inpatient hospital care extending more than 2 midnights.                            Dispo: The patient is from: Home              Anticipated d/c is to: Home              Patient currently is not medically stable to d/c.              Difficult to place patient: No  Christel Mormon M.D on 08/08/2022 at 2:48 AM  [facility]   From 7 PM-7 AM, contact  night-coverage [email or web address]  CC: Primary care physician; Vivi Barrack, MD

## 2022-08-08 NOTE — Assessment & Plan Note (Signed)
Worsening potassium, at 5.9 on repeat.  Most likely secondary to AKI. Unable to take [facility] due to decreased responsiveness. -Ordered calcium gluconate, albuterol and insulin with dextrose. -Nephrology consult

## 2022-08-08 NOTE — Assessment & Plan Note (Signed)
Significantly elevated BUN and creatinine at 107/7.85 with baseline around 1.2.  Bicarb low at 16.  Potassium 5.9. CT abdomen with concern of markedly distended bladder and bilateral hydronephrosis, underlying mass cannot be excluded.  Foley catheter was placed. Renal ultrasound also concerning for medical renal disease. -Nephrology consult -Monitor renal function -Avoid nephrotoxins

## 2022-08-08 NOTE — Progress Notes (Signed)
Palliative:  Consult received and chart reviewed. Visited patient at bedside - she is not responsive to voice or gentle physical stimulation. Discussed case with SLP. No family at bedside.  Chart review reveals HCPOA document listing [name] (daughter) as primary Media planner. Call to [facility] with no answer. Voicemail left with call back number. Will await call back and continue attempts to discuss goals of care with daughter.  Juel Burrow, DNP, AGNP-C Palliative Medicine Team Team Phone # [phone]  Pager # [phone]  NO CHARGE

## 2022-08-08 NOTE — ED Notes (Signed)
Crushed meds and offered in applesauce.. pt not awake enough to understand swallow.  Pt only able to consume half followed by a sip of water in which she began coughing.

## 2022-08-09 ENCOUNTER — Telehealth: Payer: Self-pay | Admitting: *Deleted

## 2022-08-09 ENCOUNTER — Inpatient Hospital Stay: Payer: Medicare Other

## 2022-08-09 ENCOUNTER — Other Ambulatory Visit: Payer: Self-pay

## 2022-08-09 DIAGNOSIS — Z7189 Other specified counseling: Secondary | ICD-10-CM

## 2022-08-09 DIAGNOSIS — E870 Hyperosmolality and hypernatremia: Secondary | ICD-10-CM

## 2022-08-09 DIAGNOSIS — Z515 Encounter for palliative care: Secondary | ICD-10-CM

## 2022-08-09 DIAGNOSIS — G9340 Encephalopathy, unspecified: Secondary | ICD-10-CM

## 2022-08-09 DIAGNOSIS — N179 Acute kidney failure, unspecified: Secondary | ICD-10-CM

## 2022-08-09 DIAGNOSIS — Z66 Do not resuscitate: Secondary | ICD-10-CM

## 2022-08-09 DIAGNOSIS — A419 Sepsis, unspecified organism: Secondary | ICD-10-CM | POA: Diagnosis present

## 2022-08-09 LAB — URINE CULTURE

## 2022-08-09 LAB — CBC
HCT: 29.7 % — ABNORMAL LOW (ref 36.0–46.0)
Hemoglobin: 9.3 g/dL — ABNORMAL LOW (ref 12.0–15.0)
MCH: 30.4 pg (ref 26.0–34.0)
MCHC: 31.3 g/dL (ref 30.0–36.0)
MCV: 97.1 fL (ref 80.0–100.0)
Platelets: 301 10*3/uL (ref 150–400)
RBC: 3.06 MIL/uL — ABNORMAL LOW (ref 3.87–5.11)
RDW: 15.9 % — ABNORMAL HIGH (ref 11.5–15.5)
WBC: 17.1 10*3/uL — ABNORMAL HIGH (ref 4.0–10.5)
nRBC: 0 % (ref 0.0–0.2)

## 2022-08-09 LAB — POTASSIUM
Potassium: 5.1 mmol/L (ref 3.5–5.1)
Potassium: 4.3 mmol/L (ref 3.5–5.1)
Potassium: 4.5 mmol/L (ref 3.5–5.1)

## 2022-08-09 LAB — RENAL FUNCTION PANEL
Albumin: 2.2 g/dL — ABNORMAL LOW (ref 3.5–5.0)
Anion gap: 12 (ref 5–15)
BUN: 102 mg/dL — ABNORMAL HIGH (ref 8–23)
CO2: 18 mmol/L — ABNORMAL LOW (ref 22–32)
Calcium: 8.8 mg/dL — ABNORMAL LOW (ref 8.9–10.3)
Chloride: 119 mmol/L — ABNORMAL HIGH (ref 98–111)
Creatinine, Ser: 5.26 mg/dL — ABNORMAL HIGH (ref 0.44–1.00)
GFR, Estimated: 8 mL/min — ABNORMAL LOW (ref >60)
Glucose, Bld: 99 mg/dL (ref 70–99)
Phosphorus: 5.2 mg/dL — ABNORMAL HIGH (ref 2.5–4.6)
Potassium: 4.8 mmol/L (ref 3.5–5.1)
Sodium: 149 mmol/L — ABNORMAL HIGH (ref 135–145)

## 2022-08-09 LAB — CBG MONITORING, ED: Glucose-Capillary: 99 mg/dL (ref 70–99)

## 2022-08-09 MED ORDER — SODIUM CHLORIDE 0.9 % IV SOLN
3.0000 g | INTRAVENOUS | Status: DC
  Administered 2022-08-09: 3 g via INTRAVENOUS
  Filled 2022-08-09 (×2): qty 8

## 2022-08-09 MED ORDER — DEXTROSE-NACL 5-0.45 % IV SOLN: INTRAVENOUS | Status: DC

## 2022-08-09 MED ORDER — PANTOPRAZOLE SODIUM 40 MG IV SOLR
40.0000 mg | Freq: Every day | INTRAVENOUS | Status: DC
  Administered 2022-08-09 – 2022-08-10 (×2): 40 mg via INTRAVENOUS
  Filled 2022-08-09 (×2): qty 10

## 2022-08-09 MED ORDER — ONDANSETRON HCL 4 MG/2ML IJ SOLN: 4.0000 mg | Freq: Four times a day (QID) | INTRAMUSCULAR | Status: DC | PRN

## 2022-08-09 MED ORDER — GUAIFENESIN-DM 100-10 MG/5ML PO SYRP
10.0000 mL | ORAL_SOLUTION | Freq: Three times a day (TID) | ORAL | Status: DC
  Administered 2022-08-09 – 2022-08-10 (×3): 10 mL via ORAL
  Filled 2022-08-09 (×3): qty 10

## 2022-08-09 MED ORDER — SODIUM CHLORIDE 0.9 % IV SOLN
500.0000 mg | INTRAVENOUS | Status: DC
  Administered 2022-08-09: 500 mg via INTRAVENOUS
  Filled 2022-08-09: qty 5
  Filled 2022-08-09: qty 500

## 2022-08-09 NOTE — Assessment & Plan Note (Signed)
Improving.  This is clearly secondary to her sepsis and underlying UTI and possible pneumonia. - We will monitor mental status with above management.

## 2022-08-09 NOTE — ED Notes (Signed)
MD at BS

## 2022-08-09 NOTE — Consult Note (Signed)
Pharmacy Antibiotic Note  [name] is a 81 y.o. female admitted on 08/07/2022 with  aspiration pneumonia .  Pharmacy has been consulted for unasyn dosing.  Plan: Unasyn 3g IV every 6 hours Continue to monitor and dose adjust antibiotics according to renal function and indication   Height: '5\' 4"'$  (162.6 cm) Weight: 95.7 kg (211 lb) IBW/kg (Calculated) : 54.7  Temp (24hrs), Avg:98.1 F (36.7 C), Min:97.6 F (36.4 C), Max:98.4 F (36.9 C)  Recent Labs  Lab 08/07/22 2109 08/08/22 0515 08/08/22 1110 08/08/22 2029 08/09/22 0855  WBC 33.7*  --   --   --  17.1*  CREATININE  --  7.55* 7.83* 6.33* 5.26*  LATICACIDVEN 1.7  --   --   --   --     Estimated Creatinine Clearance: 9.6 mL/min (A) (by C-G formula based on SCr of 5.26 mg/dL (H)).    Allergies  Allergen Reactions   Aspirin Other (See Comments)    Bleeding from vagina    Sulfamethoxazole-Trimethoprim Itching    Antimicrobials this admission: 10/10 Azithromycin >>  10/10 Ceftriaxone >> 10/11 10/11 Unasyn >>  Microbiology results: 10/10 BCx: NGTD 10/11 UCx: multiple species present    Thank you for allowing pharmacy to be a part of this patient's care.  Darrick Penna 08/09/2022 2:33 PM

## 2022-08-09 NOTE — Progress Notes (Signed)
Progress Note   Patient: [name] DXA:[id number] DOB: 1940/12/26 DOA: 08/07/2022     2 DOS: the patient was seen and examined on 08/09/2022   Brief hospital course: Taken from H&P.  [name] is a 81 y.o. African-American female with medical history significant for CHF, diverticulosis, GERD, gout, TIA, hypertension and dyslipidemia as well as seizure disorder, who presented to the emergency room with acute onset of altered mental status with confusion as well as significant diminished appetite.  The patient is demented and therefore most of the history was obtained from her daughter.  She has been having dry cough without wheezing or dyspnea.  The symptoms started on Saturday night.  It is hard to say if she had any urinary symptoms including frequency or urgency or dysuria or flank pain.  No chest pain or palpitations were reported.  ED course.  On arrival she was afebrile with mild tachypnea and tachycardia. Soft blood pressure at 93/57.  Labs pertinent for leukocytosis of 20.9 with neutrophilic predominance.  CMP with potassium of 5.5, bicarb of 16, BUN 107, creatinine 7.55 with baseline around 1.2-1.4.  AST 121, ALT 149, alkaline phosphatase 213, T. bili 1.7.  Troponin at 18.  Lactic acid 1.7 COVID-19 PCR negative.  Procalcitonin at 10.07.  UA with significant leukocytosis and bacteriuria. CXR with low lung volumes and mild left lung base atelectasis. CT head was without any acute intracranial abnormality  10/10: Remained afebrile.  Significant AKI and mild hyperkalemia. Added urine culture as add-on.  Preliminary blood cultures negative in less than 12-hour reporting. CT abdomen with markedly distended bladder and bilateral hydronephrosis, thickening of bladder with some diverticular appearance-underlying mass cannot be excluded.  Stranding and some thickening of intestinal wall-possible ileus.  Foley catheter was placed with removal of very cloudy urine.  Renal ultrasound  which was done after placing Foley catheter with improvement in hydronephrosis.  Medical renal disease. Blood pressure on softer side-Home antihypertensives for placed on hold. Repeat BMP with worsening hyperkalemia, calcium gluconate, albuterol and insulin with dextrose ordered as patient with decreased responsiveness and encephalopathy.  Lokelma was also ordered but she was unable to take much p.o. at this time. Nephrology was consulted.  Had a long discussion with daughter on phone.  She wants to keep her full code.  She was a hospice patient before and daughter revoked that status. Patient had last urinary output on Sunday per daughter.  Not eating or drinking for the past few days.  Patient will be very high risk for deterioration and mortality, based on underlying comorbidities and advanced age.  Palliative care was also consulted.  Daughter later decided to proceed with DNR with full scope of medical care at this time.  CODE STATUS was changed. Daughter would like to see her for next couple of days and if deteriorating or shows no improvement then they might decide about comfort care.  10/11: Patient with improved mental status, more alert.  Had 1 unwitnessed large emesis overnight.  Little worsening cough this morning, repeat chest x-ray with worsening of right lower lobe infiltrate-concerning for aspiration pneumonia. Urine cultures with multiple species-asking for a repeat but patient is already on antibiotics for more than 24 hours. 2 urine cultures from earlier this year, 1 with E. coli and 1 with Proteus mirabilis both with good sensitivity.   Blood cultures remain negative. Potassium normalized and renal functions improving. Mild hypernatremia-giving some hypertonic saline with D5. Switch antibiotics to Unasyn to cover for aspiration pneumonia. Add  Zithromax   Assessment and Plan: * Sepsis due to gram-negative UTI ([place]) Aspiration pneumonia. UTI Patient met sepsis criteria  with leukocytosis, tachycardia, tachypnea, severe sepsis with manifestations of endorgan damage at acute encephalopathy and AKI. Procalcitonin elevated at 10.07. Concern of UTI.  Urine cultures with multiple species and blood cultures remain negative. 2 prior urine cultures with E. coli and Proteus and good sensitivity. Concern of aspiration pneumonia due to unwitnessed large emesis overnight and chest x-ray with worsening right lower lobe infiltrate. -Switch antibiotics to Unasyn. -Add Zithromax -Continue with supportive care -Continue to monitor  Acute metabolic encephalopathy Improving.  This is clearly secondary to her sepsis and underlying UTI and possible pneumonia. - We will monitor mental status with above management.  Hyperkalemia Resolved.  Potassium at 4.8 today  Most likely secondary to AKI.  S/p calcium gluconate, albuterol and insulin with dextrose. Unable to take [facility] due to decreased responsiveness. -Nephrology consult  AKI (acute kidney injury) ([place]) Improving, BUN at 102, creatinine 5.26 and bicarb of 18 today. Significantly elevated BUN and creatinine at 107/7.85 with baseline around 1.2.  Bicarb low at 16.  Potassium 5.9. CT abdomen with concern of markedly distended bladder and bilateral hydronephrosis, underlying mass cannot be excluded.  Foley catheter was placed. Renal ultrasound also concerning for medical renal disease. -Continue with IV hydration -Nephrology consult -Monitor renal function -Avoid nephrotoxins  Hypernatremia Free water deficit of about 2.8 L. IV fluids switched to hypotonic saline with D5. -Continue to monitor -Encourage p.o. hydration  Essential hypertension Blood pressure within goal. -Continue holding home antihypertensives-we will restart once blood pressure starting trending up.  Gout - We will continue allopurinol.  GERD - We will can continue PPI therapy  Dementia with behavioral disturbance ([place]) - We will continue  Aricept and Zyprexa and Namenda.  Dyslipidemia - We will continue statin therapy.     Subjective: Patient was more alert and following commands when seen today.  She wants to eat.  Had a large overnight unwitnessed emesis, none of aspiration with increased cough.  Physical Exam: Vitals:   08/09/22 1130 08/09/22 1133 08/09/22 1309 08/09/22 1311  BP:    (!) 125/56  Pulse:  90  90  Resp: 20 13  18   Temp: 98.4 F (36.9 C) 98.4 F (36.9 C)  98.3 F (36.8 C)  TempSrc:      SpO2: 92% 100%  96%  Weight:   95.7 kg   Height:   5' 4"  (1.626 m)    General.  Chronically ill-appearing obese lady, in no acute distress. Pulmonary.  Lungs clear bilaterally, normal respiratory effort. CV.  Regular rate and rhythm, no JVD, rub or murmur. Abdomen.  Soft, nontender, nondistended, BS positive. CNS.  Alert and oriented to self.  No focal neurologic deficit. Extremities.  No edema, no cyanosis, pulses intact and symmetrical. Psychiatry.  Judgment and insight appears impaired.  Data Reviewed:  Prior data reviewed  Family Communication: Discussed with daughter at bedside.  Disposition: Status is: Inpatient Remains inpatient appropriate because: Severity of illness   Planned Discharge Destination: Home with Home Health  DVT prophylaxis.  Heparin Time spent: 50 minutes  This record has been created using Systems analyst. Errors have been sought and corrected,but may not always be located. Such creation errors do not reflect on the standard of care.  Author: Lorella Nimrod, MD 08/09/2022 1:56 PM  For on call review [email or web address].

## 2022-08-09 NOTE — Social Work (Signed)
CSW notified that family is interested in Hospice and has requested pt be a DNR. CSW followed up with pt's daughter who confirmed and noted a referral had been made to [facility], and she had been working with them to get pt home with Hospice. CSW spoke with [facility] rep who confirmed they are following pt. TOC will continue to follow for DC needs.

## 2022-08-09 NOTE — Patient Outreach (Signed)
  Care Coordination   Initial Visit Note   08/09/2022 Name: LURLEAN KERNEN MRN: [id number] DOB: 06-18-1941  TINESHA SIEGRIST is a 81 y.o. year old female who sees Vivi Barrack, MD for primary care. I  spoke with the daughter [name]  What matters to the patients health and wellness today?  na    Goals Addressed   None     SDOH assessments and interventions completed:  No  Explained case management services with available LSW and pharmacy for any possible needs in managing her health. Daughter indicating pt was currently inpt states as verified via Epic. Will reach out at a later date for possible needs. Note was not able to scheduled appointment but will attempt outreach over the next week.  Care Coordination Interventions Activated:  Yes  Care Coordination Interventions:  Yes, provided   Follow up plan:  Will follow next week if pt is discharged from the hospital.    Encounter Outcome:  Pt. Visit Completed   Raina Mina, RN Care Management Coordinator [place] Office [phone]

## 2022-08-09 NOTE — Consult Note (Signed)
Consultation Note Date: 08/09/2022   Patient Name: [name]  DOB: 11/19/1940  MRN: [id number]  Age / Sex: 81 y.o., female  PCP: Vivi Barrack, MD Referring Physician: Lorella Nimrod, MD  Reason for Consultation: Establishing goals of care  HPI/Patient Profile: 81 y.o. female  with past medical history of CHF, diverticulosis, GERD, gout, TIA, hypertension, dementia, seizures, and dyslipidemia  admitted on 08/07/2022 with AMS and poor PO intake. Patient diagnosed with sepsis related to UTI.  Patient also found to have AKI with potassium at 5.9 and creatinine at almost 8; now trending down.  PMT consulted to discuss goals of care.  Clinical Assessment and Goals of Care: I have reviewed medical records including EPIC notes, labs and imaging, received report from Dr. Reesa Chew, [employer], and bedside RN, assessed the patient and then met with daughter [name] to discuss diagnosis prognosis, [place], EOL wishes, disposition and options.  I introduced Palliative Medicine as specialized medical care for people living with serious illness. It focuses on providing relief from the symptoms and stress of a serious illness. The goal is to improve quality of life for both the patient and the family.  We discussed a brief life review of the patient.  [name] shares the patient lives with her she has been providing care for the patient for a while.  She tells me about what a great mother patient has been.  [name] also has 2 brothers but they have not been involved in decision-making.  As far as functional and nutritional status [name] tells me the patient has significantly declined over the past month.  She tells me patient has been bedbound with fluctuating appetite -appetite has been declining recently.  [name] also tells me of worsening cognition and patient being less verbally interactive.   We discussed patient's current illness and what it means in the larger context of  patient's on-going co-morbidities.  Natural disease trajectory and expectations at EOL were discussed.  We discussed patient's dementia and what appears to be end-stage.  We discussed bedbound status and poor p.o. intake.  We discussed risk for infections.  I attempted to elicit values and goals of care important to the patient.    The difference between aggressive medical intervention and comfort care was considered in light of the patient's goals of care.  [name] ultimately wants to focus on the patient's comfort but she is agreeable to continue current treatment for patient's UTI and AKI.  Advance directives, concepts specific to code status, artificial feeding and hydration, and rehospitalization were considered and discussed.  Discussed with [name] the importance of continued conversation with family and the medical providers regarding overall plan of care and treatment options, ensuring decisions are within the context of the patients values and GOCs.    We discussed expectations as patient nears end-of-life.  We discussed expected poor p.o. intake and risk for aspiration or repeated UTI.  [name] understands patient is nearing end-of-life.  Hospice services outpatient were explained and offered.  [name] is interested in the support of hospice in the home.  We also briefly discussed hospice facility that could become appropriate in the future but at this point [place] would like the patient to be at home.  Questions and concerns were addressed. The family was encouraged to call with questions or concerns.  Primary Decision Maker NEXT OF KIN -daughter [name]    SUMMARY OF RECOMMENDATIONS   CODE STATUS DNR/DNI Daughter would like to continue current treatment of UTI/AKI but ultimately plans to transition  to comfort focused care and hospice support at home Per chart review hospice liaison aware of family's desire for hospice support at discharge  Code Status/Advance Care Planning: DNR       Primary Diagnoses: Present on Admission:  Sepsis due to gram-negative UTI ([place])  Acute metabolic encephalopathy  GERD  Essential hypertension  Dementia with behavioral disturbance ([place])  Gout  Sepsis ([place])  Sepsis secondary to UTI [facility])   I have reviewed the medical record, interviewed the patient and family, and examined the patient. The following aspects are pertinent.  Past Medical History:  Diagnosis Date   CHF (congestive heart failure) (HCC)    Chronic venous insufficiency    Colon, diverticulosis 06/2010   outpatient colonoscopy by Dr. Cristina Gong.  Need record   DDD (degenerative disc disease), lumbosacral    Depression    DJD (degenerative joint disease), multiple sites    Low back pain worst   GERD (gastroesophageal reflux disease)    Gout    Gout    Uric Acid level 4.2 on 300 allopurinol   History of cervical cancer 01/04/71   Hx-TIA (transient ischemic attack) 05/13/2001   Right facial numbness   Hx-TIA (transient ischemic attack) 05/13/2001   Looking back in Epic, the patient had a hospitalization in July 2002 for evaluation of questionable TIA (s/s of right facial numbness associated with mild blurred vision in her right eye, diaphoresis, and mild confusion) with negative CT head and negative MRI/MRA and was started on Plavix after being heparinized. She was to have possible follow up with Neurology for reevaluation of the need for Pl   Hyperlipidemia    Hypertension    Keloid 08/01/2018   Memory disorder 12/20/2016   Mitral valve prolapse 10/27/1999   Subsequent 2D echo show normal mitral valve   Osteoarthritis of hip    bilateral hips   Ovarian cancer ([place]) 01-04-1971   S/P oophorectomy   Psychosis ([place])    Recurrent boils    History of MRSA skin infections with abscess   Seizures ([place])    Sensorineural hearing loss of both ears    Subdural hematoma ([place]) 01/26/2017   bilateral   Uncomplicated opioid dependence ([place]) 01/31/2018   Social History    Socioeconomic History   Marital status: Widowed    Spouse name: Not on file   Number of children: 3   Years of education: 15   Highest education level: Not on file  Occupational History   Occupation: Retired  Tobacco Use   Smoking status: Former    Packs/day: 0.10    Years: 35.00    Total pack years: 3.50    Types: Cigarettes    Quit date: 04/08/2013    Years since quitting: 9.3   Smokeless tobacco: Never   Tobacco comments:    smoked a few when her son died in 01/04/2013   Vaping Use   Vaping Use: Never used  Substance and Sexual Activity   Alcohol use: No    Alcohol/week: 0.0 standard drinks of alcohol   Drug use: No   Sexual activity: Never  Other Topics Concern   Not on file  Social History Narrative   Lives with daughter in [place]. Daughter is primary caretaker, surrogate decision-maker, and arranges for [facility] aide and other caregivers to supervise Ms. Crumm at home. Ambulatory   Social Determinants of Health   Financial Resource Strain: Not on file  Food Insecurity: No Food Insecurity (08/09/2022)   Hunger Vital Sign  Worried About Charity fundraiser in the Last Year: Never true    [place] in the Last Year: Never true  Transportation Needs: No Transportation Needs (08/09/2022)   PRAPARE - Hydrologist (Medical): No    Lack of Transportation (Non-Medical): No  Physical Activity: Not on file  Stress: Not on file  Social Connections: Not on file   Family History  Problem Relation Age of Onset   Diabetes Mother    Heart disease Mother    Hearing loss Mother    Stroke Mother    Post-traumatic stress disorder Father    Schizophrenia Maternal Grandmother    Diabetes Maternal Grandfather    Other Brother        brother died in mental health hospital   Kidney cancer Neg Hx    Bladder Cancer Neg Hx    Scheduled Meds:  allopurinol  300 mg Oral Daily   ascorbic acid  500 mg Oral Daily   atorvastatin  40 mg Oral Daily    buPROPion  300 mg Oral q morning   carvedilol  3.125 mg Oral BID WC   cyanocobalamin  1,000 mcg Oral Daily   donepezil  5 mg Oral QHS   fluticasone  2 spray Each Nare Daily   heparin injection (subcutaneous)  5,000 Units Subcutaneous Q8H   lamoTRIgine  25 mg Oral BID   OLANZapine  2.5 mg Oral QHS   pantoprazole (PROTONIX) IV  40 mg Intravenous Daily   sodium zirconium cyclosilicate  10 g Oral Once   Continuous Infusions:  cefTRIAXone (ROCEPHIN)  IV Stopped (08/08/22 2222)   dextrose 5 % and 0.45% NaCl 100 mL/hr at 08/09/22 1138   PRN Meds:.diclofenac Sodium, ondansetron (ZOFRAN) IV Allergies  Allergen Reactions   Aspirin Other (See Comments)    Bleeding from vagina    Sulfamethoxazole-Trimethoprim Itching   Review of Systems  Unable to perform ROS: Patient nonverbal    Physical Exam Constitutional:      General: She is not in acute distress.    Appearance: She is ill-appearing.     Comments: Awake and looking around but nonverbal  Pulmonary:     Effort: Pulmonary effort is normal.  Skin:    General: Skin is warm and dry.  Neurological:     Mental Status: She is alert.     Vital Signs: BP (!) 125/56 (BP Location: Right Arm)   Pulse 90   Temp 98.3 F (36.8 C)   Resp 18   Ht 5' 4"  (1.626 m)   Wt 95.7 kg   SpO2 96%   BMI 36.22 kg/m      Pain Score: Asleep   SpO2: SpO2: 96 % O2 Device:SpO2: 96 % O2 Flow Rate: .   IO: Intake/output summary:  Intake/Output Summary (Last 24 hours) at 08/09/2022 1312 Last data filed at 08/09/2022 0847 Gross per 24 hour  Intake 2400 ml  Output --  Net 2400 ml    LBM:   Baseline Weight: Weight: 87.8 kg Most recent weight: Weight: 95.7 kg     Palliative Assessment/Data: PPS 20%     *Please note that this is a verbal dictation therefore any spelling or grammatical errors are due to the "[place] [facility]" system interpretation.   Juel Burrow, DNP, AGNP-C Palliative Medicine Team [phone] Pager:  [phone]

## 2022-08-09 NOTE — Assessment & Plan Note (Signed)
Blood pressure within goal. -Continue holding home antihypertensives-we will restart once blood pressure starting trending up.

## 2022-08-09 NOTE — ED Notes (Signed)
Informed RN bed assigned 

## 2022-08-09 NOTE — Assessment & Plan Note (Addendum)
Aspiration pneumonia. UTI Patient met sepsis criteria with leukocytosis, tachycardia, tachypnea, severe sepsis with manifestations of endorgan damage at acute encephalopathy and AKI. Procalcitonin elevated at 10.07. Concern of UTI.  Urine cultures with multiple species and blood cultures remain negative. 2 prior urine cultures with E. coli and Proteus and good sensitivity. Concern of aspiration pneumonia due to unwitnessed large emesis overnight and chest x-ray with worsening right lower lobe infiltrate. -Switch antibiotics to Unasyn. -Add Zithromax -Continue with supportive care -Continue to monitor

## 2022-08-09 NOTE — Assessment & Plan Note (Signed)
Improving, BUN at 102, creatinine 5.26 and bicarb of 18 today. Significantly elevated BUN and creatinine at 107/7.85 with baseline around 1.2.  Bicarb low at 16.  Potassium 5.9. CT abdomen with concern of markedly distended bladder and bilateral hydronephrosis, underlying mass cannot be excluded.  Foley catheter was placed. Renal ultrasound also concerning for medical renal disease. -Continue with IV hydration -Nephrology consult -Monitor renal function -Avoid nephrotoxins

## 2022-08-09 NOTE — Progress Notes (Signed)
Speech Language Pathology Treatment: Dysphagia  Patient Details Name: [name] MRN: [id number] DOB: 04/29/1941 Today's Date: 08/09/2022 Time: 1410-1500 SLP Time Calculation (min) (ACUTE ONLY): 50 min  Assessment / Plan / Recommendation Clinical Impression  Pt seen for f/u w/ her swallowing; po trials. Pt awake intermittently w/ eyes open today per NSG report. Daughter present in room w/ questions re: pt's swallowing and how best to support pt when feeding her.  Dtr reported pt is drowsy at times -- suspect impact of declining Cognitive status/Dementia and current acute illness. Instructed only to give po's when pt is fully awake.  On RA, afebrile.  W/ MIN-MOD verbal/tactile/visual cues, pt was able to participate in few po trials/boluses -- limited po intake but adequate for Pleasure bites/sips. Pt remained nonverbal and did not follow commands but responded to presentation and support during po trials -- she even helped to hold cup for drinking.     Pt appears to present w/ oropharyngeal phase dysphagia in setting of declined Cognitive status; Baseline Dementia. ANY Cognitive decline can impact her overall awareness/timing of swallow and safety during po tasks which increases risk for aspiration, choking. Pt's risk for aspiration/aspiration pneumonia in setting of Lethargy and decreased engagement in po tasks is increased. When awake and engaged in po trials, risk for aspiration can be reduced w/ feeding support, aspiration precautions, and a modified diet -- dysphagia level 1(puree). Dtr stated she had been feeding pt pureed foods in recent weeks.          Pt was presented w/ cup trials of water, juice at her lips. She rooted and opened mouth to accept ~8-9 trials of thin liquids. A pharyngeal swallow was noted w/ trials; min lingual smacking and time for oral prep and oral phase management for A-P transfer for swallowing. This same presentation was noted w/ 1/4 tsp trials of  puree(applesauce). MOD verbal/tactile/visual cues were given during po trials to encouraged intake and swallowing overall.  No immediate, overt clinical s/s of aspiration noted w/ these trials given: no decline in respiratory status during/post trials, no cough. A delayed cough was noted x1 after trials of thin liquids were completed. Oral phase was c/b decreased awareness and attention to the boluses w/ increased time for bolus management and oral clearing -- oral phase deficits can impact the pharyngeal phase.          In setting of [place] per Hospice/Palliative Care notes, baseline Dementia and worsening Cognitive decline, risk for aspiration, and baseline Dysphagia presentation at home, recommend continue w/ a Pureed diet (as at home) w/ thin liquids via TSP/CUP w/ aspiration precautions; Oral care prior to po's. Supervision w/ all oral intake. Giving po's only when fully awake/engaged and swallowing. Check for oral clearing during po intake. PIlls Crushed in Puree.   MD/NSG/Palliative Care updated. Recommend follow w/ Hospice NSG at home for ongoing education re: impact of Cognitive decline/Dementia on swallowing and overall oral intake.       HPI HPI: Pt  is a 81 y.o. female with h/o multiple medical issues including Dementia arriving to the ED with ams, decreased PO intake. Daughter reports that pt has been increasingly confused and had decreased PO intake over the past several days, and has been appearing "uncomfortable" and fidgeting. She has stopped eating/drinking much today. NP came to check on pt today and told them to come to the ED because she seemed dehydrated.  Pt was recently on Hospice services; family revoked this per chart notes and changed  code status to FULL CODE.  Pt has exhibited a drowsy, lethargic presentation w/ difficulty awaking to take po meds per NSG report today.  CXR: Low lung volumes with mild left lung base atelectasis.  Head CT w/ no acute changes.      SLP Plan  All  goals met      Recommendations for follow up therapy are one component of a multi-disciplinary discharge planning process, led by the attending physician.  Recommendations may be updated based on patient status, additional functional criteria and insurance authorization.    Recommendations  Diet recommendations: Dysphagia 1 (puree);Thin liquid (moreso for Pleasure) Liquids provided via: Cup;Teaspoon Medication Administration: Crushed with puree Supervision: Staff to assist with self feeding;Full supervision/cueing for compensatory strategies Compensations: Minimize environmental distractions;Slow rate;Small sips/bites;Follow solids with liquid Postural Changes and/or Swallow Maneuvers: Out of bed for meals;Seated upright 90 degrees;Upright 30-60 min after meal                General recommendations:  (Palliative Care and Hospice following now and at D/C) Oral Care Recommendations: Oral care BID;Oral care before and after PO;Staff/trained caregiver to provide oral care Follow Up Recommendations: No SLP follow up Assistance recommended at discharge: Frequent or constant Supervision/Assistance (for feeding) SLP Visit Diagnosis: Dysphagia, oropharyngeal phase (R13.12) (Dementia at baseline) Plan: All goals met             Orinda Kenner, MS, [place]; [place] [phone] (ascom) [name]  08/09/2022, 4:47 PM

## 2022-08-09 NOTE — Progress Notes (Signed)
[facility])       Plan was for patient to be admitted at home with [facility] hospice services. However, patient was transferred to the ED prior to admission.    Patient is not an active patient with [facility] services. However, per conversation with daughter/[name]. Plan is for patient to DC from [facility] on 10.12 and wishes for [facility] services to begin once cleared.    [facility] will continue to follow for any discharge planning needs and to coordinate continuation of hospice care.     Please call with any questions/concerns.    Thank you for the opportunity to participate in this patient's care.   Daphene Calamity, MSW [facility] Liaison  [phone]

## 2022-08-09 NOTE — Consult Note (Signed)
[facility]  CONSULT NOTE    Date: 08/09/2022                  Patient Name:  [name]  MRN: [id number]  DOB: [birth year]  Age / Sex: 81 y.o., female         PCP: Vivi Barrack, MD                 Service Requesting Consult: [facility]                 Reason for Consult: Acute kidney injury            History of Present Illness: [name] is a 81 y.o.  female with past medical history including diverticulitis, GERD, hypertension, CHF, gout, dyslipidemia, dementia and TIA, who was admitted to [facility] on 08/07/2022 for Hyperkalemia [E87.5] Acute UTI [N39.0] Acute encephalopathy [G93.40] AKI (acute kidney injury) ([place]) [N17.9] Sepsis secondary to UTI ([place]) [A41.9, N39.0] Sepsis ([place]) [G18.2] Acute metabolic encephalopathy [X93.71] Sepsis due to gram-negative UTI ([place]) [A41.50, N39.0]  Patient was brought to emergency department for decreased oral intake and altered mental status.  Patient currently resides with her daughter who recently rescinded her hospice status.  Daughter states patient's appetite has decreased significantly over the past several days.  Daughter states she felt this was a progression of dementia.  Patient has mild confusion at baseline however confusion worse since the weekend.  Labs on ED arrival include potassium 5.5, serum bicarb 16, BUN 107, creatinine 7.55 with GFR 5, calcium 8.7, albumin 2.5, AST 121, ALT 149.  CT head negative for acute changes.  Chest x-ray shows mild left lung atelectasis.  CT abdomen pelvis showed bladder wall thickening with questionable mass and moderate bilateral hydronephrosis.  Foley catheter placed.  Renal ultrasound shows resolved hydronephrosis with Foley catheter placement.  Urinalysis appears turbid with leukocytes.   Medications: Outpatient medications: Medications Prior to Admission  Medication Sig Dispense Refill Last Dose   acetaminophen (TYLENOL) 500 MG tablet '500mg'$  at night and every 6  hours as needed. 30 tablet  Unknown at PRN   allopurinol (ZYLOPRIM) 300 MG tablet TAKE 1 TABLET BY MOUTH  DAILY 100 tablet 2 08/06/2022 at 0800   amLODipine (NORVASC) 5 MG tablet Take 1 tablet (5 mg total) by mouth daily. 90 tablet 2 08/06/2022 at 0800   ascorbic acid (VITAMIN C) 500 MG tablet Take 500 mg by mouth daily.      atorvastatin (LIPITOR) 40 MG tablet TAKE 1 TABLET BY MOUTH  DAILY 100 tablet 2 Past Week at Unknown   buPROPion (WELLBUTRIN XL) 300 MG 24 hr tablet Take 1 tablet (300 mg total) by mouth every morning. 90 tablet 1 08/06/2022 at Unknown   carvedilol (COREG) 3.125 MG tablet Take 1 tablet (3.125 mg total) by mouth 2 (two) times daily with a meal. 180 tablet 3 08/06/2022 at 0800   diclofenac Sodium (VOLTAREN) 1 % GEL Apply 4 g topically 4 (four) times daily as needed (for pain).   Unknown at PRN   donepezil (ARICEPT) 5 MG tablet Take 1 tablet (5 mg total) by mouth at bedtime. 90 tablet 3 08/01/2022 at 2000   fluticasone (FLONASE) 50 MCG/ACT nasal spray Place 1-2 sprays into both nostrils daily as needed for allergies or rhinitis.   Unknown at PRN   lamoTRIgine (LAMICTAL) 25 MG tablet Take 2 tablets twice a day 360 tablet 3 08/06/2022 at 0800   losartan (COZAAR) 100 MG  tablet TAKE 1 TABLET BY MOUTH  DAILY 100 tablet 2 08/06/2022 at 0800   nystatin (MYCOSTATIN/NYSTOP) powder Apply 1 application. topically 3 (three) times daily as needed (yeast infection). 60 g 0 Unknown at PRN   OLANZapine (ZYPREXA) 2.5 MG tablet Take 1 tab at bedtime 90 tablet 1 Past Week at 2000   pantoprazole (PROTONIX) 40 MG tablet TAKE 1 TABLET BY MOUTH TWICE  DAILY BEFORE MEALS (Patient taking differently: Take 40 mg by mouth daily.) 200 tablet 2 08/06/2022 at 0800    Current medications: Current Facility-Administered Medications  Medication Dose Route Frequency Provider Last Rate Last Admin   allopurinol (ZYLOPRIM) tablet 300 mg  300 mg Oral Daily Mansy, Jan A, MD       Ampicillin-Sulbactam (UNASYN) 3 g in sodium  chloride 0.9 % 100 mL IVPB  3 g Intravenous Q24H Darrick Penna, [facility]       ascorbic acid (VITAMIN C) tablet 500 mg  500 mg Oral Daily Mansy, Jan A, MD   500 mg at 08/08/22 1047   atorvastatin (LIPITOR) tablet 40 mg  40 mg Oral Daily Mansy, Jan A, MD   40 mg at 08/08/22 1046   azithromycin (ZITHROMAX) 500 mg in sodium chloride 0.9 % 250 mL IVPB  500 mg Intravenous Q24H Lorella Nimrod, MD 250 mL/hr at 08/09/22 1453 500 mg at 08/09/22 1453   buPROPion (WELLBUTRIN XL) 24 hr tablet 300 mg  300 mg Oral q morning Mansy, Jan A, MD   300 mg at 08/08/22 1046   carvedilol (COREG) tablet 3.125 mg  3.125 mg Oral BID WC Mansy, Jan A, MD   3.125 mg at 08/08/22 1046   cyanocobalamin (VITAMIN B12) tablet 1,000 mcg  1,000 mcg Oral Daily Mansy, Jan A, MD       dextrose 5 %-0.45 % sodium chloride infusion   Intravenous Continuous Lorella Nimrod, MD 100 mL/hr at 08/09/22 1322 IV Pump Association at 08/09/22 1322   diclofenac Sodium (VOLTAREN) 1 % topical gel 4 g  4 g Topical QID PRN Mansy, Jan A, MD       donepezil (ARICEPT) tablet 5 mg  5 mg Oral QHS Mansy, Jan A, MD   5 mg at 08/08/22 2151   fluticasone (FLONASE) 50 MCG/ACT nasal spray 2 spray  2 spray Each Nare Daily Mansy, Jan A, MD       guaiFENesin-dextromethorphan (ROBITUSSIN DM) 100-10 MG/5ML syrup 10 mL  10 mL Oral Q8H Amin, Soundra Pilon, MD       heparin injection 5,000 Units  5,000 Units Subcutaneous Q8H Lorella Nimrod, MD   5,000 Units at 08/09/22 1416   lamoTRIgine (LAMICTAL) tablet 25 mg  25 mg Oral BID Mansy, Jan A, MD   25 mg at 08/08/22 2151   OLANZapine (ZYPREXA) tablet 2.5 mg  2.5 mg Oral QHS Mansy, Jan A, MD   2.5 mg at 08/08/22 2151   ondansetron (ZOFRAN) injection 4 mg  4 mg Intravenous Q6H PRN Lorella Nimrod, MD       pantoprazole (PROTONIX) injection 40 mg  40 mg Intravenous Daily Lorella Nimrod, MD   40 mg at 08/09/22 1134   sodium zirconium cyclosilicate (LOKELMA) packet 10 g  10 g Oral Once Lorella Nimrod, MD          Allergies: Allergies   Allergen Reactions   Aspirin Other (See Comments)    Bleeding from vagina    Sulfamethoxazole-Trimethoprim Itching      Past Medical History: Past Medical History:  Diagnosis Date  CHF (congestive heart failure) (HCC)    Chronic venous insufficiency    Colon, diverticulosis 06/2010   outpatient colonoscopy by Dr. Cristina Gong.  Need record   DDD (degenerative disc disease), lumbosacral    Depression    DJD (degenerative joint disease), multiple sites    Low back pain worst   GERD (gastroesophageal reflux disease)    Gout    Gout    Uric Acid level 4.2 on 300 allopurinol   History of cervical cancer 02-02-71   Hx-TIA (transient ischemic attack) 05/13/2001   Right facial numbness   Hx-TIA (transient ischemic attack) 05/13/2001   Looking back in Epic, the patient had a hospitalization in July 2002 for evaluation of questionable TIA (s/s of right facial numbness associated with mild blurred vision in her right eye, diaphoresis, and mild confusion) with negative CT head and negative MRI/MRA and was started on Plavix after being heparinized. She was to have possible follow up with Neurology for reevaluation of the need for Pl   Hyperlipidemia    Hypertension    Keloid 08/01/2018   Memory disorder 12/20/2016   Mitral valve prolapse 10/27/1999   Subsequent 2D echo show normal mitral valve   Osteoarthritis of hip    bilateral hips   Ovarian cancer ([place]) 02/02/71   S/P oophorectomy   Psychosis ([place])    Recurrent boils    History of MRSA skin infections with abscess   Seizures ([place])    Sensorineural hearing loss of both ears    Subdural hematoma ([place]) 01/26/2017   bilateral   Uncomplicated opioid dependence ([place]) 01/31/2018     Past Surgical History: Past Surgical History:  Procedure Laterality Date   ABDOMINAL HYSTERECTOMY     02-02-71   BURR HOLE Bilateral 02/12/2017   Procedure: Haskell Flirt;  Surgeon: Earnie Larsson, MD;  Location: [place];  Service: Neurosurgery;  Laterality: Bilateral;    CERVICAL DISCECTOMY  7/07   C5-C6   JOINT REPLACEMENT     bilateral hip replacement   LUMBAR DISC SURGERY     L5-S1 7/07   LUMBAR LAMINECTOMY/DECOMPRESSION MICRODISCECTOMY N/A 02/02/2016   Procedure: L4-5 Decompression;  Surgeon: Marybelle Killings, MD;  Location: [place];  Service: Orthopedics;  Laterality: N/A;   OOPHORECTOMY     1972 for ovarian cancer     Family History: Family History  Problem Relation Age of Onset   Diabetes Mother    Heart disease Mother    Hearing loss Mother    Stroke Mother    Post-traumatic stress disorder Father    Schizophrenia Maternal Grandmother    Diabetes Maternal Grandfather    Other Brother        brother died in mental health [facility]   Kidney cancer Neg Hx    Bladder Cancer Neg Hx      Social History: Social History   Socioeconomic History   Marital status: Widowed    Spouse name: Not on file   Number of children: 3   Years of education: 15   Highest education level: Not on file  Occupational History   Occupation: Retired  Tobacco Use   Smoking status: Former    Packs/day: 0.10    Years: 35.00    Total pack years: 3.50    Types: Cigarettes    Quit date: 04/08/2013    Years since quitting: 9.3   Smokeless tobacco: Never   Tobacco comments:    smoked a few when her son died in 01-Feb-2013   Vaping Use  Vaping Use: Never used  Substance and Sexual Activity   Alcohol use: No    Alcohol/week: 0.0 standard drinks of alcohol   Drug use: No   Sexual activity: Never  Other Topics Concern   Not on file  Social History Narrative   Lives with daughter in [place]. Daughter is primary caretaker, surrogate decision-maker, and arranges for [facility] aide and other caregivers to supervise Ms. Caputi at home. Ambulatory   Social Determinants of Health   Financial Resource Strain: Not on file  Food Insecurity: No Food Insecurity (08/09/2022)   Hunger Vital Sign    Worried About Running Out of Food in the Last Year: Never true    Ran Out of Food  in the Last Year: Never true  Transportation Needs: No Transportation Needs (08/09/2022)   PRAPARE - Hydrologist (Medical): No    Lack of Transportation (Non-Medical): No  Physical Activity: Not on file  Stress: Not on file  Social Connections: Not on file  Intimate Partner Violence: Not At Risk (08/09/2022)   Humiliation, Afraid, Rape, and Kick questionnaire    Fear of Current or Ex-Partner: No    Emotionally Abused: No    Physically Abused: No    Sexually Abused: No     Review of Systems: Review of Systems  Unable to perform ROS: Dementia    Vital Signs: Blood pressure (!) 125/56, pulse 90, temperature 98.3 F (36.8 C), resp. rate 18, height '5\' 4"'$  (1.626 m), weight 95.7 kg, SpO2 96 %.  Weight trends: Filed Weights   08/07/22 2116 08/09/22 1309  Weight: 87.8 kg 95.7 kg    Physical Exam: General: NAD, resting comfortably  Head: Normocephalic, atraumatic. Dry oral mucosal membranes  Eyes: Anicteric  Lungs:  Clear to auscultation, normal effort, room air  Heart: Regular rate and rhythm  Abdomen:  Soft, nontender, obese  Extremities: No peripheral edema.  Neurologic: Nonfocal, moving all four extremities  Skin: No lesions  Access: None  GU -Foley catheter   Lab results: Basic Metabolic Panel: Recent Labs  Lab 08/08/22 1110 08/08/22 2029 08/09/22 0234 08/09/22 0855  NA 143 145  --  149*  K 5.9* 5.0  5.0 5.1 4.8  CL 112* 117*  --  119*  CO2 16* 18*  --  18*  GLUCOSE 95 107*  --  99  BUN 114* 111*  --  102*  CREATININE 7.83* 6.33*  --  5.26*  CALCIUM 8.9 8.8*  --  8.8*  PHOS 5.7*  --   --  5.2*    Liver Function Tests: Recent Labs  Lab 08/08/22 0515 08/08/22 1110 08/09/22 0855  AST 121*  --   --   ALT 149*  --   --   ALKPHOS 213*  --   --   BILITOT 1.7*  --   --   PROT 6.6  --   --   ALBUMIN 2.5* 2.8* 2.2*   No results for input(s): "LIPASE", "AMYLASE" in the last 168 hours. No results for input(s): "AMMONIA" in  the last 168 hours.  CBC: Recent Labs  Lab 08/07/22 2109 08/09/22 0855  WBC 33.7* 17.1*  NEUTROABS 29.4*  --   HGB 11.5* 9.3*  HCT 37.9 29.7*  MCV 101.1* 97.1  PLT 301 301    Cardiac Enzymes: No results for input(s): "CKTOTAL", "CKMB", "CKMBINDEX", "TROPONINI" in the last 168 hours.  BNP: Invalid input(s): "POCBNP"  CBG: Recent Labs  Lab 08/08/22 1606 08/08/22 1706 08/09/22 4008  ZOXWRU 04 540* 98    Microbiology: Results for orders placed or performed during the hospital encounter of 08/07/22  SARS Coronavirus 2 by RT PCR (hospital order, performed in [facility] hospital lab) *cepheid single result test*     Status: None   Collection Time: 08/08/22 12:11 AM   Specimen: Nasal Swab  Result Value Ref Range Status   SARS Coronavirus 2 by RT PCR NEGATIVE NEGATIVE Final    Comment: (NOTE) SARS-CoV-2 target nucleic acids are NOT DETECTED.  The SARS-CoV-2 RNA is generally detectable in upper and lower respiratory specimens during the acute phase of infection. The lowest concentration of SARS-CoV-2 viral copies this assay can detect is 250 copies / mL. A negative result does not preclude SARS-CoV-2 infection and should not be used as the sole basis for treatment or other patient management decisions.  A negative result may occur with improper specimen collection / handling, submission of specimen other than nasopharyngeal swab, presence of viral mutation(s) within the areas targeted by this assay, and inadequate number of viral copies (<250 copies / mL). A negative result must be combined with clinical observations, patient history, and epidemiological information.  Fact Sheet for Patients:   https://www.patel.info/  Fact Sheet for Healthcare Providers: https://hall.com/  This test is not yet approved or  cleared by the [place] FDA and has been authorized for detection and/or diagnosis of SARS-CoV-2 by FDA under an  Emergency Use Authorization (EUA).  This EUA will remain in effect (meaning this test can be used) for the duration of the COVID-19 declaration under Section 564(b)(1) of the Act, 21 U.S.C. section 360bbb-3(b)(1), unless the authorization is terminated or revoked sooner.  Performed at [facility], [address] [place], [place] [address]   Blood culture (routine x 2)     Status: None (Preliminary result)   Collection Time: 08/08/22 12:11 AM   Specimen: BLOOD  Result Value Ref Range Status   Specimen Description BLOOD BLOOD RIGHT ARM  Final   Special Requests   Final    BOTTLES DRAWN AEROBIC AND ANAEROBIC Blood Culture results may not be optimal due to an inadequate volume of blood received in culture bottles   Culture   Final    NO GROWTH 1 DAY Performed at [facility], [address] [place], [place] [address]    Report Status PENDING  Incomplete  Blood culture (routine x 2)     Status: None (Preliminary result)   Collection Time: 08/08/22 12:11 AM   Specimen: BLOOD  Result Value Ref Range Status   Specimen Description BLOOD BLOOD LEFT ARM  Final   Special Requests   Final    BOTTLES DRAWN AEROBIC AND ANAEROBIC Blood Culture results may not be optimal due to an inadequate volume of blood received in culture bottles   Culture   Final    NO GROWTH 1 DAY Performed at [facility], [address] [place], [place] [address]    Report Status PENDING  Incomplete  Urine Culture     Status: Abnormal   Collection Time: 08/08/22 12:11 AM   Specimen: Urine, Clean Catch  Result Value Ref Range Status   Specimen Description   Final    URINE, CLEAN CATCH Performed at [facility], [address], [place], [place] [address]    Special Requests   Final    NONE Performed at [facility], [address] [place],  [address]    Culture [place] (  A)  Final   Report Status 08/09/2022 FINAL   Final   *Note: Due to a large number of results and/or encounters for the requested time period, some results have not been displayed. A complete set of results can be found in Results Review.    Coagulation Studies: No results for input(s): "LABPROT", "INR" in the last 72 hours.  Urinalysis: Recent Labs    08/08/22 0011  COLORURINE YELLOW*  LABSPEC 1.018  PHURINE 5.0  GLUCOSEU NEGATIVE  HGBUR SMALL*  BILIRUBINUR NEGATIVE  KETONESUR NEGATIVE  PROTEINUR 100*  NITRITE NEGATIVE  LEUKOCYTESUR MODERATE*      Imaging: DG Chest Port 1 View  Result Date: 08/09/2022 CLINICAL DATA:  Reason for exam: Cough, AMS, and dehydration. Pt hx of HTN, CHF, and mitral valve prolapse. Cough, altered mental status EXAM: PORTABLE CHEST - 1 VIEW COMPARISON:  08/07/2022 FINDINGS: Increased mild airspace opacities at the right lung base. Prominent right perihilar and left basilar interstitial opacities without convincing change. Heart size and mediastinal contours are within normal limits. Tortuous thoracic aorta. No effusion. Visualized bones unremarkable. IMPRESSION: Increased patchy right lower lobe airspace disease. Electronically Signed   By: Lucrezia Europe M.D.   On: 08/09/2022 10:33   US RENAL  Result Date: 08/08/2022 CLINICAL DATA:  81 year old female with acute kidney injury. EXAM: RENAL / URINARY TRACT ULTRASOUND COMPLETE COMPARISON:  CT Abdomen and Pelvis without contrast 0948 hours today. FINDINGS: Right Kidney: Renal measurements: 9.5 x 4.9 x 5.3 cm = volume: 128 mL. Echogenic right renal cortex. The right hydronephrosis seen earlier today now appears resolved (image 4). Left Kidney: Renal measurements: 11.0 x 5.2 x 4.8 cm = volume: 146 mL. Echogenic left renal cortex (image 17). The left hydronephrosis seen earlier today appears resolved. Bladder: Decompressed now with partially visible Foley catheter balloon on image 32. Other: None. IMPRESSION: 1. Resolved bilateral hydronephrosis following Foley  catheter decompression of the abnormal bladder (see CT Abdomen and Pelvis 0948 hours today). 2. Evidence of chronic medical renal disease. Electronically Signed   By: Genevie Ann M.D.   On: 08/08/2022 11:15   CT ABDOMEN PELVIS WO CONTRAST  Result Date: 08/08/2022 CLINICAL DATA:  Sepsis, dementia, altered mental status EXAM: CT ABDOMEN AND PELVIS WITHOUT CONTRAST TECHNIQUE: Multidetector CT imaging of the abdomen and pelvis was performed following the standard protocol without IV contrast. RADIATION DOSE REDUCTION: This exam was performed according to the departmental dose-optimization program which includes automated exposure control, adjustment of the mA and/or kV according to patient size and/or use of iterative reconstruction technique. COMPARISON:  12/21/2013 FINDINGS: Lower chest: No acute abnormality. Cardiomegaly. Left and right coronary artery calcifications. Hepatobiliary: No focal liver abnormality is seen. Status post cholecystectomy. No biliary dilatation. Pancreas: Unremarkable. No pancreatic ductal dilatation or surrounding inflammatory changes. Spleen: Normal in size without significant abnormality. Adrenals/Urinary Tract: Adrenal glands are unremarkable. Moderate bilateral hydronephrosis and hydroureter to the ureterovesicular junctions. No calculi or other obstructing etiology identified. Distended urinary bladder with extensive wall thickening and fat stranding anteriorly, with a diverticular appearance of the bladder wall in this vicinity (series 2, image 64, series 6, image 83). Stomach/Bowel: Stomach is within normal limits. Appendix not clearly visualized. Multiple fluid-filled, although not overtly distended loops of small bowel throughout the abdomen. Gas and stool present throughout the colon to the rectum. Sigmoid diverticula. Vascular/Lymphatic: Aortic atherosclerosis. No enlarged abdominal or pelvic lymph nodes. Reproductive: Status post hysterectomy. Other: No abdominal wall hernia or  abnormality. No ascites. Musculoskeletal: No acute osseous findings. Status post  bilateral hip total arthroplasty. IMPRESSION: 1. Distended urinary bladder with extensive wall thickening and fat stranding anteriorly, with a diverticular appearance of the bladder wall in this vicinity. Underlying mass is not excluded. 2. Moderate bilateral hydronephrosis and hydroureter to the ureterovesicular junctions, presumably secondary to back pressure in the setting of urinary retention. No calculi or other obstructing etiology identified. 3. Multiple fluid-filled, although not overtly distended loops of small bowel throughout the abdomen. Gas and stool present throughout the colon to the rectum. Findings are suggestive of ileus. 4. Coronary artery disease. Aortic Atherosclerosis (ICD10-I70.0). Electronically Signed   By: Delanna Ahmadi M.D.   On: 08/08/2022 09:52   CT HEAD WO CONTRAST (5MM)  Result Date: 08/07/2022 CLINICAL DATA:  Altered mental status, dehydration, dementia EXAM: CT HEAD WITHOUT CONTRAST TECHNIQUE: Contiguous axial images were obtained from the base of the skull through the vertex without intravenous contrast. RADIATION DOSE REDUCTION: This exam was performed according to the departmental dose-optimization program which includes automated exposure control, adjustment of the mA and/or kV according to patient size and/or use of iterative reconstruction technique. COMPARISON:  MRI brain dated 03/29/2021 FINDINGS: Motion degraded images. Brain: No evidence of acute infarction, hemorrhage, hydrocephalus, extra-axial collection or mass lesion/mass effect. Subcortical white matter and periventricular small vessel ischemic changes. Old right thalamic lacunar infarct. Vascular: Intracranial atherosclerosis. Skull: Normal. Negative for fracture or focal lesion. Bilateral frontal burr holes. Sinuses/Orbits: The visualized paranasal sinuses are essentially clear. The mastoid air cells are unopacified. Other: None.  IMPRESSION: Motion degraded images. No evidence of acute intracranial abnormality. Small vessel ischemic changes. Old right thalamic lacunar infarct. Electronically Signed   By: Julian Hy M.D.   On: 08/07/2022 23:18   DG Chest Portable 1 View  Result Date: 08/07/2022 CLINICAL DATA:  Weakness. Altered mental status. EXAM: PORTABLE CHEST 1 VIEW COMPARISON:  Chest radiograph and CT 03/26/2021 FINDINGS: Patient is rotated. The heart is grossly stable in size. There is aortic tortuosity. Low lung volumes with mild left lung base atelectasis. No pneumothorax or pleural effusion. No pulmonary edema. IMPRESSION: Low lung volumes with mild left lung base atelectasis. Electronically Signed   By: Keith Rake M.D.   On: 08/07/2022 23:05     Assessment & Plan: [name] is a 81 y.o.  female with with past medical history including diverticulitis, GERD, hypertension, CHF, gout, dyslipidemia, dementia and TIA, who was admitted to [facility] on 08/07/2022 for Hyperkalemia [E87.5] Acute UTI [N39.0] Acute encephalopathy [G93.40] AKI (acute kidney injury) ([place]) [N17.9] Sepsis secondary to UTI ([place]) [A41.9, N39.0] Sepsis ([place]) [H06.2] Acute metabolic encephalopathy [B76.28] Sepsis due to gram-negative UTI ([place]) [A41.50, N39.0]  Acute kidney injury on chronic kidney disease stage IIIb.  Baseline GFR appears to be 30.  AKI likely secondary to hypovolemia and and moderate bilateral hydronephrosis.  CT abdomen pelvis showed bilateral hydronephrosis, renal ultrasound shows hydronephrosis resolved with Foley catheter placement.  No IV contrast exposure.  Agree with IV fluids for hydration.  Recommend avoiding nephrotoxic agents and therapies for now.  Expect renal function to improve with continued hydration.  2.  Hyponatremia/hyperkalemia.  Sodium 149, primary team has ordered D5 1/2NS infusion.  Potassium on admission 5.5 with recheck 5.9.  Patient received hyperkalemia protocol.  Attempts to dose  Lokelma unsuccessful due to decreased patient alertness.  3.  Hypertension with chronic kidney disease.  Home regimen includes amlodipine, carvedilol, and losartan.  Only prescribe carvedilol at this time.  Blood pressure stable.  4. Anemia of chronic kidney disease Lab  Results  Component Value Date   HGB 9.3 (L) 08/09/2022    Hemoglobin at goal.  LOS: 2   10/11/20232:59 PM

## 2022-08-09 NOTE — ED Notes (Signed)
Pending transport 109A. Transport initiated.

## 2022-08-09 NOTE — Patient Instructions (Signed)
Visit Information  Thank you for taking time to visit with me today. Please don't hesitate to contact me if I can be of assistance to you.   Following are the goals we discussed today:   Goals Addressed   None     Please call the care guide team at [phone] if you need to cancel or reschedule your appointment.   If you are experiencing a Mental Health or [place] or need someone to talk to, please call the Suicide and Crisis Lifeline: 988  Patient verbalizes understanding of instructions and care plan provided today and agrees to view in [place]. Active MyChart status and patient understanding of how to access instructions and care plan via MyChart confirmed with patient.     No needs at this time as pt inpt status. RN will follow up over the next week for possible assistance    Raina Mina, RN Care Management Coordinator [facility] [phone]

## 2022-08-09 NOTE — Assessment & Plan Note (Signed)
Free water deficit of about 2.8 L. IV fluids switched to hypotonic saline with D5. -Continue to monitor -Encourage p.o. hydration

## 2022-08-09 NOTE — ED Notes (Addendum)
Gown noted to be soaked in emesis. Pt sleeping. Family at [facility]. Apparently vomited/ regurgitated while sleeping/ in the night. VSS. Labs obtained and sent. Admitting, palliative, and SLP messaged.

## 2022-08-09 NOTE — IPAL (Signed)
  Interdisciplinary Goals of Care Family Meeting   Date carried out: 08/09/2022  Location of the meeting: Bedside  Member's involved: Physician, nurse, daughter  Durable Power of Attorney or Loss adjuster, chartered: Daughter  Discussion: We discussed goals of care for CAMELLA SEIM based on her age, advance dementia and underlying comorbidities.  Also concern of failure to thrive and recent infection.  CODE STATUS changed to DNR with full scope of medical care at this time.  If patient fails to respond or continue to deteriorate in the next couple of days, daughter might consider comfort care.  Code status: Full DNR  Disposition: Continue current acute care  Time spent for the meeting: 20 minutes.    Lorella Nimrod, MD  08/09/2022, 1:59 PM

## 2022-08-09 NOTE — ED Notes (Signed)
Pt drinks water without difficulty. When attempting to admin PO meds, the pills remained in pt mouth, and pt unable to swallow them at this time. Pills removed from mouth by family. WCTM.

## 2022-08-09 NOTE — Assessment & Plan Note (Signed)
Resolved.  Potassium at 4.8 today  Most likely secondary to AKI.  S/p calcium gluconate, albuterol and insulin with dextrose. Unable to take [facility] due to decreased responsiveness. -Nephrology consult

## 2022-08-10 ENCOUNTER — Telehealth: Payer: Self-pay | Admitting: Family Medicine

## 2022-08-10 LAB — CREATININE, SERUM
Creatinine, Ser: 3.29 mg/dL — ABNORMAL HIGH (ref 0.44–1.00)
GFR, Estimated: 14 mL/min — ABNORMAL LOW (ref >60)

## 2022-08-10 LAB — POTASSIUM: Potassium: 3.9 mmol/L (ref 3.5–5.1)

## 2022-08-10 MED ORDER — BISACODYL 10 MG RE SUPP
10.0000 mg | Freq: Once | RECTAL | Status: AC
  Administered 2022-08-10: 10 mg via RECTAL
  Filled 2022-08-10: qty 1

## 2022-08-10 MED ORDER — AZITHROMYCIN 500 MG PO TABS: 500.0000 mg | ORAL_TABLET | Freq: Every day | ORAL | 0 refills | Status: DC

## 2022-08-10 MED ORDER — ACETAMINOPHEN 325 MG PO TABS
650.0000 mg | ORAL_TABLET | Freq: Four times a day (QID) | ORAL | Status: DC | PRN
  Administered 2022-08-10: 650 mg via ORAL
  Filled 2022-08-10 (×2): qty 2

## 2022-08-10 MED ORDER — AMOXICILLIN-POT CLAVULANATE 250-62.5 MG/5ML PO SUSR: 250.0000 mg | Freq: Two times a day (BID) | ORAL | 0 refills | Status: DC

## 2022-08-10 MED ORDER — POLYETHYLENE GLYCOL 3350 17 G PO PACK: 17.0000 g | PACK | Freq: Every day | ORAL | 0 refills | Status: DC

## 2022-08-10 MED ORDER — POLYETHYLENE GLYCOL 3350 17 G PO PACK
17.0000 g | PACK | Freq: Every day | ORAL | Status: DC
  Administered 2022-08-10: 17 g via ORAL
  Filled 2022-08-10: qty 1

## 2022-08-10 MED ORDER — CYANOCOBALAMIN 1000 MCG PO TABS: 1000.0000 ug | ORAL_TABLET | Freq: Every day | ORAL | 0 refills | Status: DC

## 2022-08-10 MED ORDER — PANTOPRAZOLE SODIUM 40 MG PO TBEC: 40.0000 mg | DELAYED_RELEASE_TABLET | Freq: Every day | ORAL | Status: DC

## 2022-08-10 MED ORDER — CHLORHEXIDINE GLUCONATE CLOTH 2 % EX PADS
6.0000 | MEDICATED_PAD | Freq: Every day | CUTANEOUS | Status: DC
  Administered 2022-08-10: 6 via TOPICAL

## 2022-08-10 MED ORDER — AZITHROMYCIN 500 MG PO TABS: 500.0000 mg | ORAL_TABLET | Freq: Every day | ORAL | Status: DC

## 2022-08-10 NOTE — TOC Initial Note (Signed)
Transition of Care [facility]) - Initial/Assessment Note    Patient Details  Name: [name] MRN: [id number] Date of Birth: 06/21/1941  Transition of [facility]) CM/SW Contact:    Candie Chroman, LCSW Phone Number: 08/10/2022, 12:49 PM  Clinical Narrative:   CSW called patient's daughter, introduced role, and explained that discharge planning would be discussed. She confirmed plan for home with hospice. No DME needs at this time. Patient will need EMS transport home to [address] [place], [place] [address]. Will contact daughter when dc order is in.          Expected Discharge Plan: Home w Hospice Care Barriers to Discharge: No Barriers Identified   Patient Goals and CMS Choice     Choice offered to / list presented to : Adult Children  Expected Discharge Plan and Services Expected Discharge Plan: [place] Acute Care Choice: Hospice Living arrangements for the past 2 months: Single Family Home                                      Prior Living Arrangements/Services Living arrangements for the past 2 months: Single Family Home Lives with:: Adult Children Patient language and need for interpreter reviewed:: Yes Do you feel safe going back to the place where you live?: Yes      Need for Family Participation in Patient Care: Yes (Comment) Care giver support system in place?: Yes (comment)   Criminal Activity/Legal Involvement Pertinent to Current Situation/Hospitalization: No - Comment as needed  Activities of Daily Living   ADL Screening (condition at time of admission) Patient's cognitive ability adequate to safely complete daily activities?: No Is the patient deaf or have difficulty hearing?: No Does the patient have difficulty seeing, even when wearing glasses/contacts?: No Does the patient have difficulty concentrating, remembering, or making decisions?: Yes Patient able to express need for assistance with ADLs?: No Does the patient  have difficulty dressing or bathing?: Yes Independently performs ADLs?: No Communication: Independent Dressing (OT): Dependent Is this a change from baseline?: Pre-admission baseline Grooming: Dependent Is this a change from baseline?: Pre-admission baseline Feeding: Dependent Is this a change from baseline?: Pre-admission baseline Bathing: Dependent Is this a change from baseline?: Pre-admission baseline Toileting: Dependent Is this a change from baseline?: Pre-admission baseline In/Out Bed: Dependent Is this a change from baseline?: Pre-admission baseline Walks in Home: Dependent Is this a change from baseline?: Pre-admission baseline Does the patient have difficulty walking or climbing stairs?: Yes Weakness of Legs: Both Weakness of Arms/Hands: Both  Permission Sought/Granted Permission sought to share information with : Facility Sport and exercise psychologist, Family Supports    Share Information with NAME: [name]  Permission granted to share info w AGENCY: [place] granted to share info w Relationship: Daughter  Permission granted to share info w Contact Information: [phone]  Emotional Assessment         Alcohol / Substance Use: Not Applicable Psych Involvement: No (comment)  Admission diagnosis:  Hyperkalemia [E87.5] Acute UTI [N39.0] Acute encephalopathy [G93.40] AKI (acute kidney injury) ([place]) [N17.9] Sepsis secondary to UTI ([place]) [A41.9, N39.0] Sepsis ([place]) [W25.8] Acute metabolic encephalopathy [N27.78] Sepsis due to gram-negative UTI ([place]) [A41.50, N39.0] Patient Active Problem List   Diagnosis Date Noted   Sepsis secondary to UTI ([place]) 08/09/2022   Hypernatremia 08/09/2022   Dementia with behavioral disturbance ([place]) 08/08/2022   Pressure  injury of skin 08/08/2022   Hyperkalemia 08/08/2022   AKI (acute kidney injury) ([place]) 08/08/2022   Sepsis ([place]) 08/08/2022   Sepsis due to gram-negative UTI ([place]) 95/62/1308   Acute metabolic  encephalopathy 65/78/4696   Seizure disorder ([place]) 03/29/2021   Dementia ([place]) 03/26/2021   Debility 11/06/2019   Venous stasis of lower extremity 10/14/2019   Hyperglycemia 05/06/2018   Dyslipidemia 04/30/2018   Constipation 04/30/2018   Osteoarthritis 01/31/2018   Lower extremity edema 07/31/2017   History of bilateral hip replacements    History of lumbar laminectomy for spinal cord decompression 02/02/2016   Atopic dermatitis 07/09/2015   Reactive airway disease 08/11/2014   History of cancer 08/11/2014   OSA (obstructive sleep apnea) 08/11/2014   Chronic kidney disease (CKD), stage III (moderate) ([place]) 10/04/2012   Schizophrenia, chronic condition ([place]) 01/20/2012   Postmenopausal atrophic vaginitis 11/01/2010   Chronic diastolic heart failure ([place]) 05/06/2010   Allergic rhinitis 10/14/2009   Obesity, Class II, BMI 35-39.9, with comorbidity 05/14/2009   Chronic prescription opiate use 04/29/2008   Normocytic anemia 09/25/2007   Hyperlipidemia 09/03/2006   Gout 09/03/2006   Essential hypertension 09/03/2006   GERD 09/03/2006   PCP:  Vivi Barrack, MD Pharmacy:   [facility] ([facility]), [place] [address]  ([place]) [place] [address] Phone: [phone] Fax: [place], [address] [place] [address] Phone: [phone] Fax: [phone]     Social Determinants of Health (SDOH) Interventions    Readmission Risk Interventions    08/10/2022   12:47 PM  Readmission Risk Prevention Plan  PCP or Specialist Appt within 3-5 Days Complete  HRI or [place] Complete  Social Work Consult for [place] Planning/Counseling Complete  Palliative Care Screening Complete

## 2022-08-10 NOTE — Telephone Encounter (Signed)
See note

## 2022-08-10 NOTE — Progress Notes (Signed)
[facility]  ROUNDING NOTE   Subjective:   Patient seen resting quietly, eyes closed Daughter at bedside Appetite remains poor No lower extremity edema  Creatinine 3.29 UOP 1.9L  Objective:  Vital signs in last 24 hours:  Temp:  [97.5 F (36.4 C)-98.5 F (36.9 C)] 97.5 F (36.4 C) (10/12 1148) Pulse Rate:  [79-90] 90 (10/12 1148) Resp:  [18-20] 18 (10/12 1148) BP: (124-152)/(56-68) 152/59 (10/12 1148) SpO2:  [94 %-100 %] 94 % (10/12 1148) Weight:  [95.7 kg] 95.7 kg (10/11 1309)  Weight change:  Filed Weights   08/07/22 2116 08/09/22 1309  Weight: 87.8 kg 95.7 kg    Intake/Output: I/O last 3 completed shifts: In: 2400 [I.V.:200; Other:1100; IV Piggyback:1100] Out: 1900 [Urine:1900]   Intake/Output this shift:  Total I/O In: 120 [P.O.:120] Out: -   Physical Exam: General: NAD, resting comfortably  Head: Normocephalic, atraumatic. Moist oral mucosal membranes  Eyes: Anicteric  Lungs:  Clear to auscultation, normal effort, room air  Heart: Regular rate and rhythm  Abdomen:  Soft, nontender  Extremities:  No peripheral edema.  Neurologic: Nonfocal, moving all four extremities  Skin: No lesions  Access: None    Basic Metabolic Panel: Recent Labs  Lab 08/08/22 0515 08/08/22 1110 08/08/22 2029 08/09/22 0234 08/09/22 0855 08/09/22 1424 08/09/22 1812 08/10/22 0629  NA 142 143 145  --  149*  --   --   --   K 5.5* 5.9* 5.0  5.0 5.1 4.8 4.3 4.5 3.9  CL 113* 112* 117*  --  119*  --   --   --   CO2 16* 16* 18*  --  18*  --   --   --   GLUCOSE 90 95 107*  --  99  --   --   --   BUN 107* 114* 111*  --  102*  --   --   --   CREATININE 7.55* 7.83* 6.33*  --  5.26*  --   --  3.29*  CALCIUM 8.7* 8.9 8.8*  --  8.8*  --   --   --   PHOS  --  5.7*  --   --  5.2*  --   --   --     Liver Function Tests: Recent Labs  Lab 08/08/22 0515 08/08/22 1110 08/09/22 0855  AST 121*  --   --   ALT 149*  --   --   ALKPHOS 213*  --   --   BILITOT 1.7*  --    --   PROT 6.6  --   --   ALBUMIN 2.5* 2.8* 2.2*   No results for input(s): "LIPASE", "AMYLASE" in the last 168 hours. No results for input(s): "AMMONIA" in the last 168 hours.  CBC: Recent Labs  Lab 08/07/22 2109 08/09/22 0855  WBC 33.7* 17.1*  NEUTROABS 29.4*  --   HGB 11.5* 9.3*  HCT 37.9 29.7*  MCV 101.1* 97.1  PLT 301 301    Cardiac Enzymes: No results for input(s): "CKTOTAL", "CKMB", "CKMBINDEX", "TROPONINI" in the last 168 hours.  BNP: Invalid input(s): "POCBNP"  CBG: Recent Labs  Lab 08/08/22 1606 08/08/22 1706 08/09/22 0854  GLUCAP 99 159* 99    Microbiology: Results for orders placed or performed during the hospital encounter of 08/07/22  SARS Coronavirus 2 by RT PCR (hospital order, performed in [facility] hospital lab) *cepheid single result test*     Status: None   Collection Time: 08/08/22 12:11 AM  Specimen: Nasal Swab  Result Value Ref Range Status   SARS Coronavirus 2 by RT PCR NEGATIVE NEGATIVE Final    Comment: (NOTE) SARS-CoV-2 target nucleic acids are NOT DETECTED.  The SARS-CoV-2 RNA is generally detectable in upper and lower respiratory specimens during the acute phase of infection. The lowest concentration of SARS-CoV-2 viral copies this assay can detect is 250 copies / mL. A negative result does not preclude SARS-CoV-2 infection and should not be used as the sole basis for treatment or other patient management decisions.  A negative result may occur with improper specimen collection / handling, submission of specimen other than nasopharyngeal swab, presence of viral mutation(s) within the areas targeted by this assay, and inadequate number of viral copies (<250 copies / mL). A negative result must be combined with clinical observations, patient history, and epidemiological information.  Fact Sheet for Patients:   https://www.patel.info/  Fact Sheet for Healthcare  Providers: https://hall.com/  This test is not yet approved or  cleared by the [place] FDA and has been authorized for detection and/or diagnosis of SARS-CoV-2 by FDA under an Emergency Use Authorization (EUA).  This EUA will remain in effect (meaning this test can be used) for the duration of the COVID-19 declaration under Section 564(b)(1) of the Act, 21 U.S.C. section 360bbb-3(b)(1), unless the authorization is terminated or revoked sooner.  Performed at [facility], [address] [place], [place] [address]   Blood culture (routine x 2)     Status: None (Preliminary result)   Collection Time: 08/08/22 12:11 AM   Specimen: BLOOD  Result Value Ref Range Status   Specimen Description BLOOD BLOOD RIGHT ARM  Final   Special Requests   Final    BOTTLES DRAWN AEROBIC AND ANAEROBIC Blood Culture results may not be optimal due to an inadequate volume of blood received in culture bottles   Culture   Final    NO GROWTH 2 DAYS Performed at [facility], [address], [place], [place] [address]    Report Status PENDING  Incomplete  Blood culture (routine x 2)     Status: None (Preliminary result)   Collection Time: 08/08/22 12:11 AM   Specimen: BLOOD  Result Value Ref Range Status   Specimen Description BLOOD BLOOD LEFT ARM  Final   Special Requests   Final    BOTTLES DRAWN AEROBIC AND ANAEROBIC Blood Culture results may not be optimal due to an inadequate volume of blood received in culture bottles   Culture   Final    NO GROWTH 2 DAYS Performed at [facility], [address] [place], [place] [address]    Report Status PENDING  Incomplete  Urine Culture     Status: Abnormal   Collection Time: 08/08/22 12:11 AM   Specimen: Urine, Clean Catch  Result Value Ref Range Status   Specimen Description   Final    URINE, CLEAN CATCH Performed at [facility], [address] [place], [place] [address]    Special  Requests   Final    NONE Performed at [facility], [address], [place], [place] [address]    Culture MULTIPLE SPECIES PRESENT, SUGGEST RECOLLECTION (A)  Final   Report Status 08/09/2022 FINAL  Final   *Note: Due to a large number of results and/or encounters for the requested time period, some results have not been displayed. A complete set of results can be found in Results Review.    Coagulation Studies: No results for  input(s): "LABPROT", "INR" in the last 72 hours.  Urinalysis: Recent Labs    08/08/22 0011  COLORURINE YELLOW*  LABSPEC 1.018  PHURINE 5.0  GLUCOSEU NEGATIVE  HGBUR SMALL*  BILIRUBINUR NEGATIVE  KETONESUR NEGATIVE  PROTEINUR 100*  NITRITE NEGATIVE  LEUKOCYTESUR MODERATE*      Imaging: DG Chest Port 1 View  Result Date: 08/09/2022 CLINICAL DATA:  Reason for exam: Cough, AMS, and dehydration. Pt hx of HTN, CHF, and mitral valve prolapse. Cough, altered mental status EXAM: PORTABLE CHEST - 1 VIEW COMPARISON:  08/07/2022 FINDINGS: Increased mild airspace opacities at the right lung base. Prominent right perihilar and left basilar interstitial opacities without convincing change. Heart size and mediastinal contours are within normal limits. Tortuous thoracic aorta. No effusion. Visualized bones unremarkable. IMPRESSION: Increased patchy right lower lobe airspace disease. Electronically Signed   By: Lucrezia Europe M.D.   On: 08/09/2022 10:33     Medications:    ampicillin-sulbactam (UNASYN) IV 3 g (08/09/22 1658)   dextrose 5 % and 0.45% NaCl 100 mL/hr at 08/09/22 2300    allopurinol  300 mg Oral Daily   ascorbic acid  500 mg Oral Daily   atorvastatin  40 mg Oral Daily   azithromycin  500 mg Oral Daily   buPROPion  300 mg Oral q morning   carvedilol  3.125 mg Oral BID WC   Chlorhexidine Gluconate Cloth  6 each Topical Daily   cyanocobalamin  1,000 mcg Oral Daily   donepezil  5 mg Oral QHS   fluticasone  2 spray Each Nare Daily    guaiFENesin-dextromethorphan  10 mL Oral Q8H   heparin injection (subcutaneous)  5,000 Units Subcutaneous Q8H   lamoTRIgine  25 mg Oral BID   OLANZapine  2.5 mg Oral QHS   [START ON 08/11/2022] pantoprazole  40 mg Oral Daily   polyethylene glycol  17 g Oral Daily   sodium zirconium cyclosilicate  10 g Oral Once   acetaminophen, diclofenac Sodium, ondansetron (ZOFRAN) IV  Assessment/ Plan:  [name] is a 81 y.o.  female  with with past medical history including diverticulitis, GERD, hypertension, CHF, gout, dyslipidemia, dementia and TIA, who was admitted to [facility] on 08/07/2022 for Hyperkalemia [E87.5] Acute UTI [N39.0] Acute encephalopathy [G93.40] AKI (acute kidney injury) ([place]) [N17.9] Sepsis secondary to UTI ([place]) [A41.9, N39.0] Sepsis ([place]) [U72.5] Acute metabolic encephalopathy [D66.44] Sepsis due to gram-negative UTI ([place]) [A41.50, N39.0]   Acute kidney injury on chronic kidney disease stage IIIb.  Baseline GFR appears to be 30.  AKI likely secondary to hypovolemia and moderate bilateral hydronephrosis.  CT abdomen pelvis showed bilateral hydronephrosis, renal ultrasound shows hydronephrosis resolved with Foley catheter placement.  No IV contrast exposure.    Renal function has improved with IV fluids and Foley catheter.  Patient's daughter encouraged to attempt to increase oral intake however patient has dementia so this may be difficult.  Primary team will order DC of Foley and assess voiding.  We will continue to monitor.  Lab Results  Component Value Date   CREATININE 3.29 (H) 08/10/2022   CREATININE 5.26 (H) 08/09/2022   CREATININE 6.33 (H) 08/08/2022    Intake/Output Summary (Last 24 hours) at 08/10/2022 1202 Last data filed at 08/10/2022 0900 Gross per 24 hour  Intake 120 ml  Output 1900 ml  Net -1780 ml   2.  Hyponatremia/hyperkalemia.  Potassium corrected to 3.9.  elevated sodium corrected with IVF   3.  Hypertension with chronic kidney disease.  Home  regimen includes amlodipine, carvedilol, and losartan.  Only prescribe carvedilol at this time.  Blood pressure 152/59.    LOS: [place] 10/12/202312:02 PM

## 2022-08-10 NOTE — Plan of Care (Signed)

## 2022-08-10 NOTE — TOC Transition Note (Signed)
Transition of Care [facility]) - CM/SW Discharge Note   Patient Details  Name: [name] MRN: [id number] Date of Birth: 11/12/1940  [facility]) CM/SW Contact:  Laurena Slimmer, RN Phone Number: 08/10/2022, 1:12 PM   Clinical Narrative:    EMS arranged, EMS packet arranged. Nurse, MD and patient's daughter notified. TOC signing off.      Barriers to Discharge: No Barriers Identified   Patient Goals and CMS Choice     Choice offered to / list presented to : Adult Children  Discharge Placement                       Discharge Plan and Services     Post Acute Care Choice: Hospice                               Social Determinants of Health (SDOH) Interventions     Readmission Risk Interventions    08/10/2022   12:47 PM  Readmission Risk Prevention Plan  PCP or Specialist Appt within 3-5 Days Complete  HRI or [place] Complete  Social Work Consult for [place] Planning/Counseling Complete  Palliative Care Screening Complete

## 2022-08-10 NOTE — Progress Notes (Signed)
Pt d/c home via EMS. IV removed intact. VSS. Education completed. All belongings sent with pts daughter. All questions answered.

## 2022-08-10 NOTE — Telephone Encounter (Signed)
Caller is Sherri, Referral intake, with [facility] hospice   Caller states: - Pt is wanting to know if PCP will serve as attending provider for hospice   Caller requests: - A callback at [phone] with an answer

## 2022-08-10 NOTE — TOC Progression Note (Signed)
Transition of Care [facility]) - Progression Note    Patient Details  Name: [name] MRN: [id number] Date of Birth: 1941/03/30  Transition of Care [facility]) CM/SW Contact  Laurena Slimmer, RN Phone Number: 08/10/2022, 12:39 PM  Clinical Narrative:    Spoke with patient's daughter, [name]. Advised the hospice liaison would contact her. Patient daughter requesting EMS be arranged.   Spoke with [facility] representative. She stated she had spoken with patient's family.RNCM will arrange EMS for patient discharge today.            Expected Discharge Plan and Services                                                 Social Determinants of Health (SDOH) Interventions    Readmission Risk Interventions     No data to display

## 2022-08-10 NOTE — Progress Notes (Signed)
Initial Nutrition Assessment  DOCUMENTATION CODES:   Obesity unspecified  INTERVENTION:   -Ensure Enlive po TID, each supplement provides 350 kcal and 20 grams of protein -MVI with minerals daily  NUTRITION DIAGNOSIS:   Inadequate oral intake related to decreased appetite as evidenced by per patient/family report.  GOAL:   Patient will meet greater than or equal to 90% of their needs  MONITOR:   PO intake, Supplement acceptance, Diet advancement  REASON FOR ASSESSMENT:   Consult Assessment of nutrition requirement/status  ASSESSMENT:   Pt with medical history significant for CHF, diverticulosis, GERD, gout, TIA, hypertension and dyslipidemia as well as seizure disorder, who presented with acute onset of altered mental status with confusion as well as significant diminished appetite.  Pt admitted with sepsis due to ram negative UTI and acute metabolic encephalopathy.   10/11- s/p BSE- advanced to dysphagia 1 diet with thin liquids  Reviewed I/O's: -1.7 L x 24 hours and -860 since admission  UOP: 1.9 L x 24 hours   Spoke with pt and daughter at bedside. Pt will smile and nod her head to communicate. Per daughter, pt with poor oral intake for several days PTA. She took a few bites of pureed, but mostly hold it in her mouth and spits it out. Pt prefers sweet things; pt consumed a few bites of oatmeal mixed with fruit and sugar. Pt consumed all of her milk and orange juice. RD provided coffee and a strawberry Ensure (which she drinks at home). Encouraged continued use of Ensure at home.   Reviewed wt hx; pt has experienced a 3.6% wt loss over the past 3 months, which is not significant for time frame.   Per palliative care notes, plan to transition to comfort care after being medically optimized.   Medications reviewed and include vitamin C, vitamin B12, miralax, and lokelma.   Labs reviewed: Na: 149, Phos: 5.2, CBGS: 99 (inpatient orders for glycemic control are none).     NUTRITION - FOCUSED PHYSICAL EXAM:  Flowsheet Row Most Recent Value  Orbital Region No depletion  Upper Arm Region Mild depletion  Thoracic and Lumbar Region No depletion  Buccal Region No depletion  Temple Region No depletion  Clavicle Bone Region No depletion  Clavicle and Acromion Bone Region No depletion  Scapular Bone Region No depletion  Dorsal Hand No depletion  Patellar Region Mild depletion  Anterior Thigh Region Mild depletion  Posterior Calf Region Mild depletion  Edema (RD Assessment) None  Hair Reviewed  Eyes Reviewed  Mouth Reviewed  Skin Reviewed  Nails Reviewed       Diet Order:   Diet Order             Diet - low sodium heart healthy           DIET - DYS 1 Room service appropriate? No; Fluid consistency: Thin  Diet effective now                   EDUCATION NEEDS:   Education needs have been addressed  Skin:  Skin Assessment: Skin Integrity Issues: Skin Integrity Issues:: Stage II Stage II: coccyx  Last BM:  08/10/22 (type 1)  Height:   Ht Readings from Last 1 Encounters:  08/09/22 '5\' 4"'$  (1.626 m)    Weight:   Wt Readings from Last 1 Encounters:  08/09/22 95.7 kg    Ideal Body Weight:  54.5 kg  BMI:  Body mass index is 36.22 kg/m.  Estimated Nutritional Needs:  Kcal:  1700-1900  Protein:  85-100 grams  Fluid:  > 1.7 L    Loistine Chance, RD, LDN, [place] Registered Dietitian II Certified Diabetes Care and Education Specialist Please refer to [facility] for RD and/or RD on-call/weekend/after hours pager

## 2022-08-10 NOTE — Discharge Summary (Signed)
Physician Discharge Summary   Patient: [name] MRN: [id number] DOB: [birth year]  Admit date:     08/07/2022  Discharge date: 08/10/22  Discharge Physician: Carlyle Lipa   PCP: Vivi Barrack, MD   Recommendations at discharge:   Follow up with hospice care  Discharge Diagnoses: Principal Problem:   Sepsis due to gram-negative UTI [facility]) Active Problems:   Acute metabolic encephalopathy   Hyperkalemia   AKI (acute kidney injury) ([place])   Hypernatremia   Essential hypertension   Gout   GERD   Dementia with behavioral disturbance (HCC)   Dyslipidemia   Pressure injury of skin   Sepsis ([place])   Sepsis secondary to UTI [facility])  Resolved Problems:   * No resolved hospital problems. [facility] Course: Taken from H&P.  [name] is a 81 y.o. African-American female with medical history significant for CHF, diverticulosis, GERD, gout, TIA, hypertension and dyslipidemia as well as seizure disorder, who presented to the emergency room with acute onset of altered mental status with confusion as well as significant diminished appetite.  The patient is demented and therefore most of the history was obtained from her daughter.  She has been having dry cough without wheezing or dyspnea.  The symptoms started on Saturday night.  It is hard to say if she had any urinary symptoms including frequency or urgency or dysuria or flank pain.  No chest pain or palpitations were reported.  Patient was admitted with the hospitalist team, she was diagnosed of having sepsis secondary to UTI, aspiration pneumonia.  Blood cultures remain negative.  Urine culture showed multiple species.  Patient was started on broad-spectrum antibiotics.  However there was a large emesis that was unwitnessed and aspiration was suspected and patient's antibiotics were switched to Unasyn and Zithromax was added to the regimen.  Patient also had acute metabolic encephalopathy secondary to underlying UTI and  sepsis.  Patient's mentation had fairly improved with the above-mentioned treatment.  Patient also presented with AKI secondary to hydronephrosis suggestive of obstructive uropathy, Foley catheter was placed, ultrasound of the kidney also showed medical renal disease.  Her creatinine however had improved a repeat of 2 days from 6.33-3.2 with hydration.,  Patient's hyperkalemia also had resolved.  Patient had some mild improvement other comorbidities were fairly stable.  Patient has severe underlying dementia with behavioral disturbance.  Interdisciplinary goals of care family meeting was conducted and given that the patient is having advanced dementia and underlying comorbidities the patient's daughter who is the power of attorney wanted to pursue hospice care at home and have the patient discharged on 08/10/2022.  The wishes of the family were carried out.              Consultants: Nephrology Procedures performed: None Disposition: Hospice care Diet recommendation:  Discharge Diet Orders (From admission, onward)     Start     Ordered   08/10/22 0000  Diet - low sodium heart healthy        08/10/22 1255           Dysphagia type 1 Thin Liquid DISCHARGE MEDICATION: Allergies as of 08/10/2022       Reactions   Aspirin Other (See Comments)   Bleeding from vagina   Sulfamethoxazole-trimethoprim Itching        Medication List     STOP taking these medications    amLODipine 5 MG tablet Commonly known as: NORVASC   losartan 100 MG tablet Commonly known as: COZAAR  Vitamin B12 1000 MCG Tbcr Replaced by: cyanocobalamin 1000 MCG tablet       TAKE these medications    acetaminophen 500 MG tablet Commonly known as: TYLENOL '500mg'$  at night and every 6 hours as needed.   allopurinol 300 MG tablet Commonly known as: ZYLOPRIM TAKE 1 TABLET BY MOUTH  DAILY   amoxicillin-clavulanate 250-62.5 MG/5ML suspension Commonly known as: AUGMENTIN Take 5 mLs (250 mg total) by  mouth 2 (two) times daily for 4 days.   ascorbic acid 500 MG tablet Commonly known as: VITAMIN C Take 500 mg by mouth daily.   atorvastatin 40 MG tablet Commonly known as: LIPITOR TAKE 1 TABLET BY MOUTH  DAILY   azithromycin 500 MG tablet Commonly known as: Zithromax Take 1 tablet (500 mg total) by mouth daily for 3 days. Take 1 tablet daily for 3 days.   buPROPion 300 MG 24 hr tablet Commonly known as: WELLBUTRIN XL Take 1 tablet (300 mg total) by mouth every morning.   carvedilol 3.125 MG tablet Commonly known as: COREG Take 1 tablet (3.125 mg total) by mouth 2 (two) times daily with a meal.   cyanocobalamin 1000 MCG tablet Take 1 tablet (1,000 mcg total) by mouth daily. Start taking on: August 11, 2022 Replaces: Vitamin B12 1000 MCG Tbcr   diclofenac Sodium 1 % Gel Commonly known as: VOLTAREN Apply 4 g topically 4 (four) times daily as needed (for pain).   donepezil 5 MG tablet Commonly known as: Aricept Take 1 tablet (5 mg total) by mouth at bedtime.   fluticasone 50 MCG/ACT nasal spray Commonly known as: FLONASE Place 1-2 sprays into both nostrils daily as needed for allergies or rhinitis.   lamoTRIgine 25 MG tablet Commonly known as: LAMICTAL Take 2 tablets twice a day   nystatin powder Commonly known as: MYCOSTATIN/NYSTOP Apply 1 application. topically 3 (three) times daily as needed (yeast infection).   OLANZapine 2.5 MG tablet Commonly known as: ZyPREXA Take 1 tab at bedtime   pantoprazole 40 MG tablet Commonly known as: PROTONIX TAKE 1 TABLET BY MOUTH TWICE  DAILY BEFORE MEALS What changed: when to take this   polyethylene glycol 17 g packet Commonly known as: MIRALAX / GLYCOLAX Take 17 g by mouth daily. Start taking on: August 11, 2022               Discharge Care Instructions  (From admission, onward)           Start     Ordered   08/10/22 0000  No dressing needed        08/10/22 1255            Discharge Exam: Filed  Weights   08/07/22 2116 08/09/22 1309  Weight: 87.8 kg 95.7 kg     Condition at discharge: poor  The results of significant diagnostics from this hospitalization (including imaging, microbiology, ancillary and laboratory) are listed below for reference.   Imaging Studies: DG Chest Port 1 View  Result Date: 08/09/2022 CLINICAL DATA:  Reason for exam: Cough, AMS, and dehydration. Pt hx of HTN, CHF, and mitral valve prolapse. Cough, altered mental status EXAM: PORTABLE CHEST - 1 VIEW COMPARISON:  08/07/2022 FINDINGS: Increased mild airspace opacities at the right lung base. Prominent right perihilar and left basilar interstitial opacities without convincing change. Heart size and mediastinal contours are within normal limits. Tortuous thoracic aorta. No effusion. Visualized bones unremarkable. IMPRESSION: Increased patchy right lower lobe airspace disease. Electronically Signed   By: Lucrezia Europe  M.D.   On: 08/09/2022 10:33   US RENAL  Result Date: 08/08/2022 CLINICAL DATA:  81 year old female with acute kidney injury. EXAM: RENAL / URINARY TRACT ULTRASOUND COMPLETE COMPARISON:  CT Abdomen and Pelvis without contrast 0948 hours today. FINDINGS: Right Kidney: Renal measurements: 9.5 x 4.9 x 5.3 cm = volume: 128 mL. Echogenic right renal cortex. The right hydronephrosis seen earlier today now appears resolved (image 4). Left Kidney: Renal measurements: 11.0 x 5.2 x 4.8 cm = volume: 146 mL. Echogenic left renal cortex (image 17). The left hydronephrosis seen earlier today appears resolved. Bladder: Decompressed now with partially visible Foley catheter balloon on image 32. Other: None. IMPRESSION: 1. Resolved bilateral hydronephrosis following Foley catheter decompression of the abnormal bladder (see CT Abdomen and Pelvis 0948 hours today). 2. Evidence of chronic medical renal disease. Electronically Signed   By: Genevie Ann M.D.   On: 08/08/2022 11:15   CT ABDOMEN PELVIS WO CONTRAST  Result Date:  08/08/2022 CLINICAL DATA:  Sepsis, dementia, altered mental status EXAM: CT ABDOMEN AND PELVIS WITHOUT CONTRAST TECHNIQUE: Multidetector CT imaging of the abdomen and pelvis was performed following the standard protocol without IV contrast. RADIATION DOSE REDUCTION: This exam was performed according to the departmental dose-optimization program which includes automated exposure control, adjustment of the mA and/or kV according to patient size and/or use of iterative reconstruction technique. COMPARISON:  12/21/2013 FINDINGS: Lower chest: No acute abnormality. Cardiomegaly. Left and right coronary artery calcifications. Hepatobiliary: No focal liver abnormality is seen. Status post cholecystectomy. No biliary dilatation. Pancreas: Unremarkable. No pancreatic ductal dilatation or surrounding inflammatory changes. Spleen: Normal in size without significant abnormality. Adrenals/Urinary Tract: Adrenal glands are unremarkable. Moderate bilateral hydronephrosis and hydroureter to the ureterovesicular junctions. No calculi or other obstructing etiology identified. Distended urinary bladder with extensive wall thickening and fat stranding anteriorly, with a diverticular appearance of the bladder wall in this vicinity (series 2, image 64, series 6, image 83). Stomach/Bowel: Stomach is within normal limits. Appendix not clearly visualized. Multiple fluid-filled, although not overtly distended loops of small bowel throughout the abdomen. Gas and stool present throughout the colon to the rectum. Sigmoid diverticula. Vascular/Lymphatic: Aortic atherosclerosis. No enlarged abdominal or pelvic lymph nodes. Reproductive: Status post hysterectomy. Other: No abdominal wall hernia or abnormality. No ascites. Musculoskeletal: No acute osseous findings. Status post bilateral hip total arthroplasty. IMPRESSION: 1. Distended urinary bladder with extensive wall thickening and fat stranding anteriorly, with a diverticular appearance of the  bladder wall in this vicinity. Underlying mass is not excluded. 2. Moderate bilateral hydronephrosis and hydroureter to the ureterovesicular junctions, presumably secondary to back pressure in the setting of urinary retention. No calculi or other obstructing etiology identified. 3. Multiple fluid-filled, although not overtly distended loops of small bowel throughout the abdomen. Gas and stool present throughout the colon to the rectum. Findings are suggestive of ileus. 4. Coronary artery disease. Aortic Atherosclerosis (ICD10-I70.0). Electronically Signed   By: Delanna Ahmadi M.D.   On: 08/08/2022 09:52   CT HEAD WO CONTRAST (5MM)  Result Date: 08/07/2022 CLINICAL DATA:  Altered mental status, dehydration, dementia EXAM: CT HEAD WITHOUT CONTRAST TECHNIQUE: Contiguous axial images were obtained from the base of the skull through the vertex without intravenous contrast. RADIATION DOSE REDUCTION: This exam was performed according to the departmental dose-optimization program which includes automated exposure control, adjustment of the mA and/or kV according to patient size and/or use of iterative reconstruction technique. COMPARISON:  MRI brain dated 03/29/2021 FINDINGS: Motion degraded images. Brain: No evidence of acute  infarction, hemorrhage, hydrocephalus, extra-axial collection or mass lesion/mass effect. Subcortical white matter and periventricular small vessel ischemic changes. Old right thalamic lacunar infarct. Vascular: Intracranial atherosclerosis. Skull: Normal. Negative for fracture or focal lesion. Bilateral frontal burr holes. Sinuses/Orbits: The visualized paranasal sinuses are essentially clear. The mastoid air cells are unopacified. Other: None. IMPRESSION: Motion degraded images. No evidence of acute intracranial abnormality. Small vessel ischemic changes. Old right thalamic lacunar infarct. Electronically Signed   By: Julian Hy M.D.   On: 08/07/2022 23:18   DG Chest Portable 1  View  Result Date: 08/07/2022 CLINICAL DATA:  Weakness. Altered mental status. EXAM: PORTABLE CHEST 1 VIEW COMPARISON:  Chest radiograph and CT 03/26/2021 FINDINGS: Patient is rotated. The heart is grossly stable in size. There is aortic tortuosity. Low lung volumes with mild left lung base atelectasis. No pneumothorax or pleural effusion. No pulmonary edema. IMPRESSION: Low lung volumes with mild left lung base atelectasis. Electronically Signed   By: Keith Rake M.D.   On: 08/07/2022 23:05    Microbiology: Results for orders placed or performed during the hospital encounter of 08/07/22  SARS Coronavirus 2 by RT PCR (hospital order, performed in [facility] hospital lab) *cepheid single result test*     Status: None   Collection Time: 08/08/22 12:11 AM   Specimen: Nasal Swab  Result Value Ref Range Status   SARS Coronavirus 2 by RT PCR NEGATIVE NEGATIVE Final    Comment: (NOTE) SARS-CoV-2 target nucleic acids are NOT DETECTED.  The SARS-CoV-2 RNA is generally detectable in upper and lower respiratory specimens during the acute phase of infection. The lowest concentration of SARS-CoV-2 viral copies this assay can detect is 250 copies / mL. A negative result does not preclude SARS-CoV-2 infection and should not be used as the sole basis for treatment or other patient management decisions.  A negative result may occur with improper specimen collection / handling, submission of specimen other than nasopharyngeal swab, presence of viral mutation(s) within the areas targeted by this assay, and inadequate number of viral copies (<250 copies / mL). A negative result must be combined with clinical observations, patient history, and epidemiological information.  Fact Sheet for Patients:   https://www.patel.info/  Fact Sheet for Healthcare Providers: https://hall.com/  This test is not yet approved or  cleared by the [place] FDA and has been  authorized for detection and/or diagnosis of SARS-CoV-2 by FDA under an Emergency Use Authorization (EUA).  This EUA will remain in effect (meaning this test can be used) for the duration of the COVID-19 declaration under Section 564(b)(1) of the Act, 21 U.S.C. section 360bbb-3(b)(1), unless the authorization is terminated or revoked sooner.  Performed at [facility], [address] [place], [place] [address]   Blood culture (routine x 2)     Status: None (Preliminary result)   Collection Time: 08/08/22 12:11 AM   Specimen: BLOOD  Result Value Ref Range Status   Specimen Description BLOOD BLOOD RIGHT ARM  Final   Special Requests   Final    BOTTLES DRAWN AEROBIC AND ANAEROBIC Blood Culture results may not be optimal due to an inadequate volume of blood received in culture bottles   Culture   Final    NO GROWTH 2 DAYS Performed at [facility], [address], [place], [place] [address]    Report Status PENDING  Incomplete  Blood culture (routine x 2)     Status: None (Preliminary result)   Collection Time: 08/08/22 12:11 AM  Specimen: BLOOD  Result Value Ref Range Status   Specimen Description BLOOD BLOOD LEFT ARM  Final   Special Requests   Final    BOTTLES DRAWN AEROBIC AND ANAEROBIC Blood Culture results may not be optimal due to an inadequate volume of blood received in culture bottles   Culture   Final    NO GROWTH 2 DAYS Performed at [facility], [address] [place], [place] [address]    Report Status PENDING  Incomplete  Urine Culture     Status: Abnormal   Collection Time: 08/08/22 12:11 AM   Specimen: Urine, Clean Catch  Result Value Ref Range Status   Specimen Description   Final    URINE, CLEAN CATCH Performed at [facility], [address], [place], [place] [address]    Special Requests   Final    NONE Performed at [facility], [address], [place], [place] [address]    Culture MULTIPLE SPECIES  PRESENT, SUGGEST RECOLLECTION (A)  Final   Report Status 08/09/2022 FINAL  Final   *Note: Due to a large number of results and/or encounters for the requested time period, some results have not been displayed. A complete set of results can be found in Results Review.    Labs: CBC: Recent Labs  Lab 08/07/22 2109 08/09/22 0855  WBC 33.7* 17.1*  NEUTROABS 29.4*  --   HGB 11.5* 9.3*  HCT 37.9 29.7*  MCV 101.1* 97.1  PLT 301 676   Basic Metabolic Panel: Recent Labs  Lab 08/08/22 0515 08/08/22 1110 08/08/22 2029 08/09/22 0234 08/09/22 0855 08/09/22 1424 08/09/22 1812 08/10/22 0629  NA 142 143 145  --  149*  --   --   --   K 5.5* 5.9* 5.0  5.0 5.1 4.8 4.3 4.5 3.9  CL 113* 112* 117*  --  119*  --   --   --   CO2 16* 16* 18*  --  18*  --   --   --   GLUCOSE 90 95 107*  --  99  --   --   --   BUN 107* 114* 111*  --  102*  --   --   --   CREATININE 7.55* 7.83* 6.33*  --  5.26*  --   --  3.29*  CALCIUM 8.7* 8.9 8.8*  --  8.8*  --   --   --   PHOS  --  5.7*  --   --  5.2*  --   --   --    Liver Function Tests: Recent Labs  Lab 08/08/22 0515 08/08/22 1110 08/09/22 0855  AST 121*  --   --   ALT 149*  --   --   ALKPHOS 213*  --   --   BILITOT 1.7*  --   --   PROT 6.6  --   --   ALBUMIN 2.5* 2.8* 2.2*   CBG: Recent Labs  Lab 08/08/22 1606 08/08/22 1706 08/09/22 0854  GLUCAP 99 159* 99    Discharge time spent: greater than 30 minutes.  Signed: Carlyle Lipa, MD [facility] 08/10/2022

## 2022-08-11 ENCOUNTER — Inpatient Hospital Stay (HOSPITAL_COMMUNITY)
Admission: EM | Admit: 2022-08-11 | Discharge: 2022-08-18 | DRG: 871 | Disposition: A | Discharge status: Hospice | Payer: Medicare Other | Attending: Internal Medicine | Admitting: Internal Medicine

## 2022-08-11 ENCOUNTER — Other Ambulatory Visit: Payer: Self-pay

## 2022-08-11 ENCOUNTER — Encounter (HOSPITAL_COMMUNITY): Payer: Self-pay | Admitting: *Deleted

## 2022-08-11 DIAGNOSIS — B3731 Acute candidiasis of vulva and vagina: Secondary | ICD-10-CM | POA: Diagnosis present

## 2022-08-11 DIAGNOSIS — I341 Nonrheumatic mitral (valve) prolapse: Secondary | ICD-10-CM | POA: Diagnosis not present

## 2022-08-11 DIAGNOSIS — R404 Transient alteration of awareness: Secondary | ICD-10-CM | POA: Diagnosis not present

## 2022-08-11 DIAGNOSIS — J690 Pneumonitis due to inhalation of food and vomit: Secondary | ICD-10-CM | POA: Diagnosis not present

## 2022-08-11 DIAGNOSIS — R109 Unspecified abdominal pain: Secondary | ICD-10-CM | POA: Diagnosis present

## 2022-08-11 DIAGNOSIS — G40909 Epilepsy, unspecified, not intractable, without status epilepticus: Secondary | ICD-10-CM

## 2022-08-11 DIAGNOSIS — Z9049 Acquired absence of other specified parts of digestive tract: Secondary | ICD-10-CM

## 2022-08-11 DIAGNOSIS — K219 Gastro-esophageal reflux disease without esophagitis: Secondary | ICD-10-CM | POA: Diagnosis present

## 2022-08-11 DIAGNOSIS — N179 Acute kidney failure, unspecified: Secondary | ICD-10-CM | POA: Diagnosis present

## 2022-08-11 DIAGNOSIS — Z8249 Family history of ischemic heart disease and other diseases of the circulatory system: Secondary | ICD-10-CM

## 2022-08-11 DIAGNOSIS — Z66 Do not resuscitate: Secondary | ICD-10-CM | POA: Diagnosis not present

## 2022-08-11 DIAGNOSIS — Z96643 Presence of artificial hip joint, bilateral: Secondary | ICD-10-CM | POA: Diagnosis present

## 2022-08-11 DIAGNOSIS — E669 Obesity, unspecified: Secondary | ICD-10-CM | POA: Diagnosis present

## 2022-08-11 DIAGNOSIS — F03918 Unspecified dementia, unspecified severity, with other behavioral disturbance: Secondary | ICD-10-CM | POA: Diagnosis present

## 2022-08-11 DIAGNOSIS — E785 Hyperlipidemia, unspecified: Secondary | ICD-10-CM | POA: Diagnosis present

## 2022-08-11 DIAGNOSIS — A419 Sepsis, unspecified organism: Secondary | ICD-10-CM | POA: Diagnosis not present

## 2022-08-11 DIAGNOSIS — Z87891 Personal history of nicotine dependence: Secondary | ICD-10-CM

## 2022-08-11 DIAGNOSIS — K567 Ileus, unspecified: Secondary | ICD-10-CM | POA: Diagnosis present

## 2022-08-11 DIAGNOSIS — H903 Sensorineural hearing loss, bilateral: Secondary | ICD-10-CM | POA: Diagnosis present

## 2022-08-11 DIAGNOSIS — I872 Venous insufficiency (chronic) (peripheral): Secondary | ICD-10-CM | POA: Diagnosis not present

## 2022-08-11 DIAGNOSIS — Z882 Allergy status to sulfonamides status: Secondary | ICD-10-CM

## 2022-08-11 DIAGNOSIS — E870 Hyperosmolality and hypernatremia: Secondary | ICD-10-CM | POA: Diagnosis not present

## 2022-08-11 DIAGNOSIS — L89152 Pressure ulcer of sacral region, stage 2: Secondary | ICD-10-CM | POA: Diagnosis not present

## 2022-08-11 DIAGNOSIS — N3001 Acute cystitis with hematuria: Secondary | ICD-10-CM

## 2022-08-11 DIAGNOSIS — Z90722 Acquired absence of ovaries, bilateral: Secondary | ICD-10-CM

## 2022-08-11 DIAGNOSIS — F03C18 Unspecified dementia, severe, with other behavioral disturbance: Secondary | ICD-10-CM | POA: Diagnosis not present

## 2022-08-11 DIAGNOSIS — Z886 Allergy status to analgesic agent status: Secondary | ICD-10-CM

## 2022-08-11 DIAGNOSIS — R627 Adult failure to thrive: Secondary | ICD-10-CM | POA: Diagnosis present

## 2022-08-11 DIAGNOSIS — N1330 Unspecified hydronephrosis: Secondary | ICD-10-CM | POA: Diagnosis not present

## 2022-08-11 DIAGNOSIS — F209 Schizophrenia, unspecified: Secondary | ICD-10-CM | POA: Diagnosis present

## 2022-08-11 DIAGNOSIS — Z7189 Other specified counseling: Secondary | ICD-10-CM | POA: Diagnosis not present

## 2022-08-11 DIAGNOSIS — R112 Nausea with vomiting, unspecified: Secondary | ICD-10-CM | POA: Diagnosis not present

## 2022-08-11 DIAGNOSIS — R6520 Severe sepsis without septic shock: Secondary | ICD-10-CM | POA: Diagnosis not present

## 2022-08-11 DIAGNOSIS — N1832 Chronic kidney disease, stage 3b: Secondary | ICD-10-CM | POA: Diagnosis not present

## 2022-08-11 DIAGNOSIS — N390 Urinary tract infection, site not specified: Secondary | ICD-10-CM | POA: Diagnosis not present

## 2022-08-11 DIAGNOSIS — R748 Abnormal levels of other serum enzymes: Secondary | ICD-10-CM | POA: Diagnosis not present

## 2022-08-11 DIAGNOSIS — I130 Hypertensive heart and chronic kidney disease with heart failure and stage 1 through stage 4 chronic kidney disease, or unspecified chronic kidney disease: Secondary | ICD-10-CM | POA: Diagnosis present

## 2022-08-11 DIAGNOSIS — R110 Nausea: Secondary | ICD-10-CM | POA: Diagnosis not present

## 2022-08-11 DIAGNOSIS — R1111 Vomiting without nausea: Secondary | ICD-10-CM | POA: Diagnosis not present

## 2022-08-11 DIAGNOSIS — I5032 Chronic diastolic (congestive) heart failure: Secondary | ICD-10-CM | POA: Diagnosis not present

## 2022-08-11 DIAGNOSIS — Z8673 Personal history of transient ischemic attack (TIA), and cerebral infarction without residual deficits: Secondary | ICD-10-CM

## 2022-08-11 DIAGNOSIS — Z515 Encounter for palliative care: Secondary | ICD-10-CM

## 2022-08-11 DIAGNOSIS — Z79899 Other long term (current) drug therapy: Secondary | ICD-10-CM

## 2022-08-11 DIAGNOSIS — Z90710 Acquired absence of both cervix and uterus: Secondary | ICD-10-CM

## 2022-08-11 DIAGNOSIS — R000 Tachycardia, unspecified: Secondary | ICD-10-CM | POA: Diagnosis not present

## 2022-08-11 DIAGNOSIS — R1110 Vomiting, unspecified: Secondary | ICD-10-CM | POA: Diagnosis not present

## 2022-08-11 DIAGNOSIS — Z743 Need for continuous supervision: Secondary | ICD-10-CM | POA: Diagnosis not present

## 2022-08-11 DIAGNOSIS — B999 Unspecified infectious disease: Secondary | ICD-10-CM | POA: Diagnosis not present

## 2022-08-11 DIAGNOSIS — Z6836 Body mass index (BMI) 36.0-36.9, adult: Secondary | ICD-10-CM

## 2022-08-11 DIAGNOSIS — I700 Atherosclerosis of aorta: Secondary | ICD-10-CM | POA: Diagnosis not present

## 2022-08-11 DIAGNOSIS — Z823 Family history of stroke: Secondary | ICD-10-CM

## 2022-08-11 DIAGNOSIS — M109 Gout, unspecified: Secondary | ICD-10-CM | POA: Diagnosis present

## 2022-08-11 DIAGNOSIS — Z8541 Personal history of malignant neoplasm of cervix uteri: Secondary | ICD-10-CM

## 2022-08-11 DIAGNOSIS — Z8614 Personal history of Methicillin resistant Staphylococcus aureus infection: Secondary | ICD-10-CM

## 2022-08-11 DIAGNOSIS — J189 Pneumonia, unspecified organism: Secondary | ICD-10-CM

## 2022-08-11 DIAGNOSIS — Z833 Family history of diabetes mellitus: Secondary | ICD-10-CM

## 2022-08-11 DIAGNOSIS — N134 Hydroureter: Secondary | ICD-10-CM | POA: Diagnosis not present

## 2022-08-11 DIAGNOSIS — Z818 Family history of other mental and behavioral disorders: Secondary | ICD-10-CM

## 2022-08-11 DIAGNOSIS — Z8543 Personal history of malignant neoplasm of ovary: Secondary | ICD-10-CM

## 2022-08-11 NOTE — ED Triage Notes (Signed)
The pt arrived by [place] from home  where she is cared for by her daughter  the pt recently diagnosed with a uti and renal failure  the daughter according to ems does not know how to help the pt  she was referred to hospice cars but has not met with the daughter yet  the pt is non-verbal  alert

## 2022-08-11 NOTE — Telephone Encounter (Signed)
Yes that is ok.  Algis Greenhouse. [name], MD 08/11/2022 7:35 AM

## 2022-08-11 NOTE — Telephone Encounter (Signed)
I called Sherri, and let her know your response. She said thanks !

## 2022-08-12 ENCOUNTER — Emergency Department (HOSPITAL_COMMUNITY): Payer: Medicare Other

## 2022-08-12 DIAGNOSIS — E785 Hyperlipidemia, unspecified: Secondary | ICD-10-CM | POA: Diagnosis present

## 2022-08-12 DIAGNOSIS — G40909 Epilepsy, unspecified, not intractable, without status epilepticus: Secondary | ICD-10-CM

## 2022-08-12 DIAGNOSIS — N179 Acute kidney failure, unspecified: Secondary | ICD-10-CM

## 2022-08-12 DIAGNOSIS — J690 Pneumonitis due to inhalation of food and vomit: Secondary | ICD-10-CM | POA: Diagnosis not present

## 2022-08-12 DIAGNOSIS — F209 Schizophrenia, unspecified: Secondary | ICD-10-CM | POA: Diagnosis present

## 2022-08-12 DIAGNOSIS — N390 Urinary tract infection, site not specified: Secondary | ICD-10-CM

## 2022-08-12 DIAGNOSIS — R112 Nausea with vomiting, unspecified: Secondary | ICD-10-CM | POA: Diagnosis not present

## 2022-08-12 DIAGNOSIS — R945 Abnormal results of liver function studies: Secondary | ICD-10-CM | POA: Diagnosis not present

## 2022-08-12 DIAGNOSIS — J189 Pneumonia, unspecified organism: Secondary | ICD-10-CM | POA: Diagnosis not present

## 2022-08-12 DIAGNOSIS — K219 Gastro-esophageal reflux disease without esophagitis: Secondary | ICD-10-CM

## 2022-08-12 DIAGNOSIS — F03918 Unspecified dementia, unspecified severity, with other behavioral disturbance: Secondary | ICD-10-CM | POA: Diagnosis not present

## 2022-08-12 DIAGNOSIS — I872 Venous insufficiency (chronic) (peripheral): Secondary | ICD-10-CM | POA: Diagnosis present

## 2022-08-12 DIAGNOSIS — R748 Abnormal levels of other serum enzymes: Secondary | ICD-10-CM | POA: Diagnosis not present

## 2022-08-12 DIAGNOSIS — Z515 Encounter for palliative care: Secondary | ICD-10-CM | POA: Diagnosis not present

## 2022-08-12 DIAGNOSIS — A419 Sepsis, unspecified organism: Secondary | ICD-10-CM | POA: Diagnosis not present

## 2022-08-12 DIAGNOSIS — N3001 Acute cystitis with hematuria: Secondary | ICD-10-CM | POA: Diagnosis not present

## 2022-08-12 DIAGNOSIS — N1330 Unspecified hydronephrosis: Secondary | ICD-10-CM | POA: Diagnosis not present

## 2022-08-12 DIAGNOSIS — Z66 Do not resuscitate: Secondary | ICD-10-CM | POA: Diagnosis not present

## 2022-08-12 DIAGNOSIS — I700 Atherosclerosis of aorta: Secondary | ICD-10-CM | POA: Diagnosis not present

## 2022-08-12 DIAGNOSIS — K567 Ileus, unspecified: Secondary | ICD-10-CM | POA: Diagnosis not present

## 2022-08-12 DIAGNOSIS — Z7189 Other specified counseling: Secondary | ICD-10-CM | POA: Diagnosis not present

## 2022-08-12 DIAGNOSIS — N1832 Chronic kidney disease, stage 3b: Secondary | ICD-10-CM | POA: Diagnosis present

## 2022-08-12 DIAGNOSIS — K6389 Other specified diseases of intestine: Secondary | ICD-10-CM | POA: Diagnosis not present

## 2022-08-12 DIAGNOSIS — R935 Abnormal findings on diagnostic imaging of other abdominal regions, including retroperitoneum: Secondary | ICD-10-CM | POA: Diagnosis not present

## 2022-08-12 DIAGNOSIS — F03C18 Unspecified dementia, severe, with other behavioral disturbance: Secondary | ICD-10-CM | POA: Diagnosis present

## 2022-08-12 DIAGNOSIS — L89152 Pressure ulcer of sacral region, stage 2: Secondary | ICD-10-CM | POA: Diagnosis present

## 2022-08-12 DIAGNOSIS — E870 Hyperosmolality and hypernatremia: Secondary | ICD-10-CM | POA: Diagnosis present

## 2022-08-12 DIAGNOSIS — E669 Obesity, unspecified: Secondary | ICD-10-CM | POA: Diagnosis present

## 2022-08-12 DIAGNOSIS — R1110 Vomiting, unspecified: Secondary | ICD-10-CM | POA: Diagnosis not present

## 2022-08-12 DIAGNOSIS — R109 Unspecified abdominal pain: Secondary | ICD-10-CM | POA: Diagnosis not present

## 2022-08-12 DIAGNOSIS — I341 Nonrheumatic mitral (valve) prolapse: Secondary | ICD-10-CM | POA: Diagnosis present

## 2022-08-12 DIAGNOSIS — H903 Sensorineural hearing loss, bilateral: Secondary | ICD-10-CM | POA: Diagnosis present

## 2022-08-12 DIAGNOSIS — I130 Hypertensive heart and chronic kidney disease with heart failure and stage 1 through stage 4 chronic kidney disease, or unspecified chronic kidney disease: Secondary | ICD-10-CM | POA: Diagnosis present

## 2022-08-12 DIAGNOSIS — M109 Gout, unspecified: Secondary | ICD-10-CM | POA: Diagnosis present

## 2022-08-12 DIAGNOSIS — I5032 Chronic diastolic (congestive) heart failure: Secondary | ICD-10-CM | POA: Diagnosis present

## 2022-08-12 DIAGNOSIS — B3731 Acute candidiasis of vulva and vagina: Secondary | ICD-10-CM | POA: Diagnosis present

## 2022-08-12 DIAGNOSIS — R6520 Severe sepsis without septic shock: Secondary | ICD-10-CM | POA: Diagnosis not present

## 2022-08-12 DIAGNOSIS — R933 Abnormal findings on diagnostic imaging of other parts of digestive tract: Secondary | ICD-10-CM | POA: Diagnosis not present

## 2022-08-12 DIAGNOSIS — N134 Hydroureter: Secondary | ICD-10-CM | POA: Diagnosis not present

## 2022-08-12 LAB — COMPREHENSIVE METABOLIC PANEL
ALT: 122 U/L — ABNORMAL HIGH (ref 0–44)
AST: 71 U/L — ABNORMAL HIGH (ref 15–41)
Albumin: 2.4 g/dL — ABNORMAL LOW (ref 3.5–5.0)
Alkaline Phosphatase: 241 U/L — ABNORMAL HIGH (ref 38–126)
Anion gap: 14 (ref 5–15)
BUN: 56 mg/dL — ABNORMAL HIGH (ref 8–23)
CO2: 17 mmol/L — ABNORMAL LOW (ref 22–32)
Calcium: 9.3 mg/dL (ref 8.9–10.3)
Chloride: 112 mmol/L — ABNORMAL HIGH (ref 98–111)
Creatinine, Ser: 2.93 mg/dL — ABNORMAL HIGH (ref 0.44–1.00)
GFR, Estimated: 16 mL/min — ABNORMAL LOW (ref >60)
Glucose, Bld: 80 mg/dL (ref 70–99)
Potassium: 4.9 mmol/L (ref 3.5–5.1)
Sodium: 143 mmol/L (ref 135–145)
Total Bilirubin: 2.2 mg/dL — ABNORMAL HIGH (ref 0.3–1.2)
Total Protein: 7.3 g/dL (ref 6.5–8.1)

## 2022-08-12 LAB — CBC WITH DIFFERENTIAL/PLATELET
Abs Immature Granulocytes: 0.6 10*3/uL — ABNORMAL HIGH (ref 0.00–0.07)
Basophils Absolute: 0 10*3/uL (ref 0.0–0.1)
Basophils Relative: 0 %
Eosinophils Absolute: 0.6 10*3/uL — ABNORMAL HIGH (ref 0.0–0.5)
Eosinophils Relative: 2 %
HCT: 35.8 % — ABNORMAL LOW (ref 36.0–46.0)
Hemoglobin: 11.2 g/dL — ABNORMAL LOW (ref 12.0–15.0)
Lymphocytes Relative: 6 %
Lymphs Abs: 1.9 10*3/uL (ref 0.7–4.0)
MCH: 31.4 pg (ref 26.0–34.0)
MCHC: 31.3 g/dL (ref 30.0–36.0)
MCV: 100.3 fL — ABNORMAL HIGH (ref 80.0–100.0)
Metamyelocytes Relative: 1 %
Monocytes Absolute: 0.6 10*3/uL (ref 0.1–1.0)
Monocytes Relative: 2 %
Myelocytes: 1 %
Neutro Abs: 27.8 10*3/uL — ABNORMAL HIGH (ref 1.7–7.7)
Neutrophils Relative %: 88 %
Platelets: 349 10*3/uL (ref 150–400)
RBC: 3.57 MIL/uL — ABNORMAL LOW (ref 3.87–5.11)
RDW: 16.2 % — ABNORMAL HIGH (ref 11.5–15.5)
WBC: 31.6 10*3/uL — ABNORMAL HIGH (ref 4.0–10.5)
nRBC: 0 /100 WBC
nRBC: 0.2 % (ref 0.0–0.2)

## 2022-08-12 LAB — URINALYSIS, ROUTINE W REFLEX MICROSCOPIC
Bilirubin Urine: NEGATIVE
Glucose, UA: NEGATIVE mg/dL
Ketones, ur: NEGATIVE mg/dL
Nitrite: NEGATIVE
Protein, ur: 30 mg/dL — AB
RBC / HPF: >50 RBC/hpf — ABNORMAL HIGH (ref 0–5)
Specific Gravity, Urine: 1.017 (ref 1.005–1.030)
WBC, UA: >50 WBC/hpf — ABNORMAL HIGH (ref 0–5)
pH: 5 (ref 5.0–8.0)

## 2022-08-12 LAB — LIPASE, BLOOD: Lipase: 153 U/L — ABNORMAL HIGH (ref 11–51)

## 2022-08-12 LAB — LACTIC ACID, PLASMA: Lactic Acid, Venous: 1.9 mmol/L (ref 0.5–1.9)

## 2022-08-12 MED ORDER — HEPARIN SODIUM (PORCINE) 5000 UNIT/ML IJ SOLN
5000.0000 [IU] | Freq: Three times a day (TID) | INTRAMUSCULAR | Status: DC
  Administered 2022-08-12 – 2022-08-18 (×15): 5000 [IU] via SUBCUTANEOUS
  Filled 2022-08-12 (×17): qty 1

## 2022-08-12 MED ORDER — ALBUTEROL SULFATE (2.5 MG/3ML) 0.083% IN NEBU
2.5000 mg | INHALATION_SOLUTION | Freq: Four times a day (QID) | RESPIRATORY_TRACT | Status: DC | PRN
  Administered 2022-08-17: 2.5 mg via RESPIRATORY_TRACT
  Filled 2022-08-12: qty 3

## 2022-08-12 MED ORDER — ACETAMINOPHEN 325 MG PO TABS: 650.0000 mg | ORAL_TABLET | Freq: Four times a day (QID) | ORAL | Status: DC | PRN

## 2022-08-12 MED ORDER — CARVEDILOL 3.125 MG PO TABS
3.1250 mg | ORAL_TABLET | Freq: Two times a day (BID) | ORAL | Status: DC
  Administered 2022-08-12 – 2022-08-16 (×6): 3.125 mg via ORAL
  Filled 2022-08-12 (×9): qty 1

## 2022-08-12 MED ORDER — ACETAMINOPHEN 325 MG PO TABS
650.0000 mg | ORAL_TABLET | Freq: Once | ORAL | Status: AC
  Administered 2022-08-12: 650 mg via ORAL
  Filled 2022-08-12: qty 2

## 2022-08-12 MED ORDER — SORBITOL 70 % SOLN
300.0000 mL | TOPICAL_OIL | Freq: Once | ORAL | Status: AC
  Administered 2022-08-13: 300 mL via RECTAL
  Filled 2022-08-12: qty 90

## 2022-08-12 MED ORDER — ACETAMINOPHEN 650 MG RE SUPP
650.0000 mg | Freq: Four times a day (QID) | RECTAL | Status: DC | PRN
  Administered 2022-08-12: 650 mg via RECTAL
  Filled 2022-08-12: qty 1

## 2022-08-12 MED ORDER — PANTOPRAZOLE SODIUM 40 MG IV SOLR
40.0000 mg | INTRAVENOUS | Status: DC
  Administered 2022-08-12 – 2022-08-16 (×5): 40 mg via INTRAVENOUS
  Filled 2022-08-12 (×5): qty 10

## 2022-08-12 MED ORDER — SODIUM CHLORIDE 0.9 % IV SOLN
500.0000 mg | Freq: Once | INTRAVENOUS | Status: AC
  Administered 2022-08-12: 500 mg via INTRAVENOUS
  Filled 2022-08-12: qty 5

## 2022-08-12 MED ORDER — FENTANYL CITRATE PF 50 MCG/ML IJ SOSY
25.0000 ug | PREFILLED_SYRINGE | INTRAMUSCULAR | Status: DC | PRN
  Administered 2022-08-12 (×2): 50 ug via INTRAVENOUS
  Administered 2022-08-12: 25 ug via INTRAVENOUS
  Filled 2022-08-12 (×4): qty 1

## 2022-08-12 MED ORDER — HEPARIN SODIUM (PORCINE) 5000 UNIT/ML IJ SOLN
5000.0000 [IU] | Freq: Three times a day (TID) | INTRAMUSCULAR | Status: DC
  Administered 2022-08-12: 5000 [IU] via SUBCUTANEOUS
  Filled 2022-08-12: qty 1

## 2022-08-12 MED ORDER — SODIUM CHLORIDE 0.9 % IV SOLN
1.0000 g | Freq: Once | INTRAVENOUS | Status: AC
  Administered 2022-08-12: 1 g via INTRAVENOUS
  Filled 2022-08-12: qty 10

## 2022-08-12 MED ORDER — SODIUM CHLORIDE 0.9 % IV SOLN
500.0000 mg | INTRAVENOUS | Status: DC
  Administered 2022-08-13 – 2022-08-17 (×5): 500 mg via INTRAVENOUS
  Filled 2022-08-12 (×6): qty 5

## 2022-08-12 MED ORDER — SODIUM CHLORIDE 0.9% FLUSH
3.0000 mL | Freq: Two times a day (BID) | INTRAVENOUS | Status: DC
  Administered 2022-08-12 – 2022-08-17 (×10): 3 mL via INTRAVENOUS

## 2022-08-12 MED ORDER — FENTANYL CITRATE PF 50 MCG/ML IJ SOSY
50.0000 ug | PREFILLED_SYRINGE | Freq: Once | INTRAMUSCULAR | Status: AC
  Administered 2022-08-12: 50 ug via INTRAVENOUS
  Filled 2022-08-12: qty 1

## 2022-08-12 MED ORDER — LORAZEPAM 2 MG/ML IJ SOLN: 0.2500 mg | Freq: Four times a day (QID) | INTRAMUSCULAR | Status: DC | PRN

## 2022-08-12 MED ORDER — SODIUM CHLORIDE 0.9 % IV SOLN: INTRAVENOUS | Status: DC

## 2022-08-12 MED ORDER — SODIUM CHLORIDE 0.9 % IV SOLN
2.0000 g | INTRAVENOUS | Status: DC
  Administered 2022-08-13 – 2022-08-17 (×5): 2 g via INTRAVENOUS
  Filled 2022-08-12 (×6): qty 20

## 2022-08-12 MED ORDER — LAMOTRIGINE 100 MG PO TABS
50.0000 mg | ORAL_TABLET | Freq: Two times a day (BID) | ORAL | Status: DC
  Administered 2022-08-12 – 2022-08-16 (×7): 50 mg via ORAL
  Filled 2022-08-12: qty 2
  Filled 2022-08-12 (×5): qty 1
  Filled 2022-08-12 (×2): qty 2
  Filled 2022-08-12: qty 1

## 2022-08-12 NOTE — TOC Initial Note (Addendum)
Transition of Care [facility]) - Initial/Assessment Note    Patient Details  Name: [name] MRN: [id number] Date of Birth: [birth year]  Transition of [facility] CM/SW Contact:    Verdell Carmine, RN Phone Number: 08/12/2022, 10:58 AM  Clinical Narrative:                  81 yo patient just discharged 2 days ago from [place] under hospice care. Hospice has not been to see the patient yet. She started having nausea/vomiting. Daughter states she feels like she did not receive enough information, and she spoke to hospice yesterday who couldn't come at that time., would likely set up next week.  She is now requesting to  have another work up done. And will consider dialysis. Unsure if the patient is a candidate, she has dementia , advanced and her daughter is MPOA. [place] with Hospice. They were scheduled  to see her at home today.      CM will follow for needs, recommendations, and transitions of care    Barriers to Discharge: Continued Medical Work up   Patient Goals and CMS Choice        Expected Discharge Plan and Services     Discharge Planning Services: CM Consult   Living arrangements for the past 2 months: [place]                                      Prior Living Arrangements/Services Living arrangements for the past 2 months: Single Family Home Lives with:: Adult Children Patient language and need for interpreter reviewed:: Yes        Need for Family Participation in Patient Care: Yes (Comment) Care giver support system in place?: Yes (comment)   Criminal Activity/Legal Involvement Pertinent to Current Situation/Hospitalization: No - Comment as needed  Activities of Daily Living      Permission Sought/Granted                  Emotional Assessment       Orientation: : Fluctuating Orientation (Suspected and/or reported [place]) Alcohol / Substance Use: Not Applicable Psych Involvement: No  (comment)  Admission diagnosis:  Sepsis ([place]) [A41.9] Patient Active Problem List   Diagnosis Date Noted   Sepsis secondary to UTI ([place]) 08/09/2022   Hypernatremia 08/09/2022   Dementia with behavioral disturbance ([place]) 08/08/2022   Pressure injury of skin 08/08/2022   Hyperkalemia 08/08/2022   AKI (acute kidney injury) ([place]) 08/08/2022   Sepsis ([place]) 08/08/2022   Sepsis due to gram-negative UTI ([place]) 03/47/4259   Acute metabolic encephalopathy 56/38/7564   Seizure disorder ([place]) 03/29/2021   Dementia ([place]) 03/26/2021   Debility 11/06/2019   Venous stasis of lower extremity 10/14/2019   Hyperglycemia 05/06/2018   Dyslipidemia 04/30/2018   Constipation 04/30/2018   Osteoarthritis 01/31/2018   Lower extremity edema 07/31/2017   History of bilateral hip replacements    History of lumbar laminectomy for spinal cord decompression 02/02/2016   Atopic dermatitis 07/09/2015   Reactive airway disease 08/11/2014   History of cancer 08/11/2014   OSA (obstructive sleep apnea) 08/11/2014   Chronic kidney disease (CKD), stage III (moderate) ([place]) 10/04/2012   Schizophrenia, chronic condition ([place]) 01/20/2012   Postmenopausal atrophic vaginitis 11/01/2010   Chronic diastolic heart failure ([place]) 05/06/2010   Allergic rhinitis 10/14/2009   Obesity, Class II, BMI 35-39.9, with comorbidity 05/14/2009   Chronic prescription  opiate use 04/29/2008   Normocytic anemia 09/25/2007   Hyperlipidemia 09/03/2006   Gout 09/03/2006   Essential hypertension 09/03/2006   GERD 09/03/2006   PCP:  Vivi Barrack, MD Pharmacy:   [facility] [address]), [place] - [address] [place] ([place]) [place] [address] Phone: [phone] Fax: [place], [address] [place] [address] Phone: [phone] Fax: [phone]     Social Determinants of Health (SDOH) Interventions     Readmission Risk Interventions    08/10/2022   12:47 PM  Readmission Risk Prevention Plan  PCP or Specialist Appt within 3-5 Days Complete  HRI or [place] Complete  Social Work Consult for [place] Planning/Counseling Complete  Palliative Care Screening Complete

## 2022-08-12 NOTE — ED Notes (Signed)
The pt started vomiting as she was getting tylenol approx 562m of liquid no formed particles

## 2022-08-12 NOTE — H&P (Signed)
History and Physical    Patient: [name] TGG:[id number] DOB: [birth year] DOA: 08/11/2022 DOS: the patient was seen and examined on 08/12/2022 PCP: Vivi Barrack, MD  Patient coming from: Home  Chief Complaint:  Chief Complaint  Patient presents with   Emesis   HPI: [name] is a 81 y.o. female with medical history significant of hypertension, dyslipidemia, diastolic CHF, diverticulosis, GERD, gout, TIA, seizure disorder, and severe dementia with behavioral disturbance who presents with complaints of vomiting.  History is obtained from review of records and talks with the patient's daughter at bedside.  Hospitalized 10/9-10/12 with acute renal failure and sepsis thought secondary to UTI and aspiration pneumonia.  Blood cultures were negative and urine cultures showed multiple species.  Treated with broad-spectrum antibiotics initially and switched to Unasyn and azithromycin.  She was noted to have concern for hydronephrosis suggestive of obstructive uropathy for which Foley catheter was placed.  Creatinine improved with IV hydration from 7.83 down to 3.29 prior to discharge.  Palliative care has been consulted to help establish goals of care and ultimately patient was discharged home with hospice.  Prior to discharge patient had seem to be in pain from her back and was constipated.  She was manually disimpacted and pain complaints seem to improve.  Daughter notes that her Foley catheter had also been discontinued although it was said that it would stay in.  After getting home patient had developed abdominal pain.  The patient's daughter had given her Tylenol and subsequently Pepto-Bismol, but patient vomited the Pepto-Bismol up.  Thereafter patient slept the rest of the day, but the next morning had been unable to get comfortable and fidgeting.  Her daughter tried giving her Tylenol and tramadol without improvement in symptoms.  Patient appeared to be in significant pain and she had  not been given any medications to keep her comfortable.  Plan was for hospice to see the patient today, but due to her being so much discomfort brought her back to the hospital overnight. Patient's daughter had only tried to give her medications of Coreg and Lamictal.  Upon admission into the emergency department patient was seen to be febrile up to 100.4 F with tachycardia and tachypnea.  Labs significant for WBC 31.6, hemoglobin 11.2, BUN 56, creatinine 2.93, alkaline phosphatase 241, albumin 2.4, lipase 153, AST 71, ALT 122, and total bilirubin 2.2.  Chest x-ray noted mild airspace disease in the right lung base slightly improved from prior exam.  Urinalysis noted moderate hemoglobin, moderate leukocytes, many bacteria, greater than 50 RBCs/hpf, 21-50 squamous epithelial/LPF, and greater than 50 WBCs.  Blood and urine cultures have been obtained.  Patient was given acetaminophen 650 mg p.o., Rocephin, and azithromycin.    Review of Systems: As mentioned in the history of present illness. All other systems reviewed and are negative. Past Medical History:  Diagnosis Date   CHF (congestive heart failure) (HCC)    Chronic venous insufficiency    Colon, diverticulosis 06/2010   outpatient colonoscopy by Dr. Cristina Gong.  Need record   DDD (degenerative disc disease), lumbosacral    Depression    DJD (degenerative joint disease), multiple sites    Low back pain worst   GERD (gastroesophageal reflux disease)    Gout    Gout    Uric Acid level 4.2 on 300 allopurinol   History of cervical cancer 1972   Hx-TIA (transient ischemic attack) 05/13/2001   Right facial numbness   Hx-TIA (transient ischemic attack) 05/13/2001  Looking back in Epic, the patient had a hospitalization in July 2002 for evaluation of questionable TIA (s/s of right facial numbness associated with mild blurred vision in her right eye, diaphoresis, and mild confusion) with negative CT head and negative MRI/MRA and was started on  Plavix after being heparinized. She was to have possible follow up with Neurology for reevaluation of the need for Pl   Hyperlipidemia    Hypertension    Keloid 08/01/2018   Memory disorder 12/20/2016   Mitral valve prolapse 10/27/1999   Subsequent 2D echo show normal mitral valve   Osteoarthritis of hip    bilateral hips   Ovarian cancer ([place]) 1972   S/P oophorectomy   Psychosis ([place])    Recurrent boils    History of MRSA skin infections with abscess   Seizures ([place])    Sensorineural hearing loss of both ears    Subdural hematoma ([place]) 01/26/2017   bilateral   Uncomplicated opioid dependence ([place]) 01/31/2018   Past Surgical History:  Procedure Laterality Date   ABDOMINAL HYSTERECTOMY     1972   BURR HOLE Bilateral 02/12/2017   Procedure: Haskell Flirt;  Surgeon: Earnie Larsson, MD;  Location: [facility];  Service: Neurosurgery;  Laterality: Bilateral;   CERVICAL DISCECTOMY  7/07   C5-C6   JOINT REPLACEMENT     bilateral hip replacement   LUMBAR DISC SURGERY     L5-S1 7/07   LUMBAR LAMINECTOMY/DECOMPRESSION MICRODISCECTOMY N/A 02/02/2016   Procedure: L4-5 Decompression;  Surgeon: Marybelle Killings, MD;  Location: [facility];  Service: Orthopedics;  Laterality: N/A;   OOPHORECTOMY     1972 for ovarian cancer   Social History:  reports that she quit smoking about 9 years ago. Her smoking use included cigarettes. She has a 3.50 pack-year smoking history. She has never used smokeless tobacco. She reports that she does not drink alcohol and does not use drugs.  Allergies  Allergen Reactions   Aspirin Other (See Comments)    Bleeding from vagina    Sulfamethoxazole-Trimethoprim Itching    Family History  Problem Relation Age of Onset   Diabetes Mother    Heart disease Mother    Hearing loss Mother    Stroke Mother    Post-traumatic stress disorder Father    Schizophrenia Maternal Grandmother    Diabetes Maternal Grandfather    Other Brother        brother died in mental health hospital    Kidney cancer Neg Hx    Bladder Cancer Neg Hx     Prior to Admission medications   Medication Sig Start Date End Date Taking? Authorizing Provider  carvedilol (COREG) 3.125 MG tablet Take 1 tablet (3.125 mg total) by mouth 2 (two) times daily with a meal. 02/07/22  Yes Pemberton, Greer Ee, MD  lamoTRIgine (LAMICTAL) 25 MG tablet Take 2 tablets twice a day Patient taking differently: Take 50 mg by mouth 2 (two) times daily. 07/25/22  Yes Cameron Sprang, MD  nystatin (MYCOSTATIN/NYSTOP) powder Apply 1 application. topically 3 (three) times daily as needed (yeast infection). 01/10/22  Yes Vivi Barrack, MD  acetaminophen (TYLENOL) 500 MG tablet '500mg'$  at night and every 6 hours as needed. Patient not taking: Reported on 08/12/2022 01/10/22   Vivi Barrack, MD  allopurinol (ZYLOPRIM) 300 MG tablet TAKE 1 TABLET BY MOUTH  DAILY Patient not taking: Reported on 08/12/2022 03/13/22   Vivi Barrack, MD  amoxicillin-clavulanate (AUGMENTIN) 250-62.5 MG/5ML suspension Take 5 mLs (250 mg total) by  mouth 2 (two) times daily for 4 days. Patient not taking: Reported on 08/12/2022 08/10/22 08/14/22  Carlyle Lipa, MD  ascorbic acid (VITAMIN C) 500 MG tablet Take 500 mg by mouth daily. Patient not taking: Reported on 08/12/2022    [provider]  atorvastatin (LIPITOR) 40 MG tablet TAKE 1 TABLET BY MOUTH  DAILY Patient not taking: Reported on 08/12/2022 02/17/22   Vivi Barrack, MD  azithromycin (ZITHROMAX) 500 MG tablet Take 1 tablet (500 mg total) by mouth daily for 3 days. Take 1 tablet daily for 3 days. Patient not taking: Reported on 08/12/2022 08/10/22 08/13/22  Carlyle Lipa, MD  buPROPion (WELLBUTRIN XL) 300 MG 24 hr tablet Take 1 tablet (300 mg total) by mouth every morning. Patient not taking: Reported on 08/12/2022 03/28/22   Arfeen, Arlyce Harman, MD  cyanocobalamin 1000 MCG tablet Take 1 tablet (1,000 mcg total) by mouth daily. Patient not taking: Reported on 08/12/2022 08/11/22  09/10/22  Carlyle Lipa, MD  diclofenac Sodium (VOLTAREN) 1 % GEL Apply 4 g topically 4 (four) times daily as needed (for pain). Patient not taking: Reported on 08/12/2022    [provider]  donepezil (ARICEPT) 5 MG tablet Take 1 tablet (5 mg total) by mouth at bedtime. Patient not taking: Reported on 08/12/2022 07/25/22 07/25/23  Cameron Sprang, MD  fluticasone [facility]) 50 MCG/ACT nasal spray Place 1-2 sprays into both nostrils daily as needed for allergies or rhinitis. Patient not taking: Reported on 08/12/2022    [provider]  OLANZapine (ZYPREXA) 2.5 MG tablet Take 1 tab at bedtime Patient not taking: Reported on 08/12/2022 03/28/22   Arfeen, Arlyce Harman, MD  pantoprazole (PROTONIX) 40 MG tablet TAKE 1 TABLET BY MOUTH TWICE  DAILY BEFORE MEALS Patient not taking: Reported on 08/12/2022 05/23/22   Vivi Barrack, MD  polyethylene glycol (MIRALAX / GLYCOLAX) 17 g packet Take 17 g by mouth daily. Patient not taking: Reported on 08/12/2022 08/11/22   Carlyle Lipa, MD    Physical Exam: Vitals:   08/12/22 0430 08/12/22 0530 08/12/22 0715 08/12/22 0805  BP: 134/76 109/77 (!) 135/105   Pulse: (!) 101 (!) 105 (!) 102   Resp: (!) 22 (!) 24 (!) 23   Temp:    100.1 F (37.8 C)  TempSrc:    Axillary  SpO2: 97% 95% 100%   Weight:      Height:        Constitutional: Lethargic elderly female currently in no acute distress Eyes: PERRL, lids and conjunctivae normal ENMT: Mucous membranes are dry Neck: normal, supple, no ABD Respiratory: Tachypneic with rales throughout both lung fields.  O2 saturation currently maintained on room air. Cardiovascular: Tachycardic gallops. No extremity edema.  Abdomen:distended abdomen without tenderness to palpation currently.  Bowel sounds positive.  Musculoskeletal: no clubbing / cyanosis. No joint deformity upper and lower extremities. Good ROM, no contractures. Normal muscle tone.  Skin: no rashes, lesions, ulcers. No  induration Neurologic: CN 2-12 grossly intact.  Strength 5/5 in all 4.  Psychiatric: lethargic, unable to assess at this time. Data Reviewed:  EKG reveals sinus tachycardia at 104 bpm with abnormal R wave progression.  Reviewed labs, imaging, and pertinent records as noted above in HPI.  Assessment and Plan: Sepsis due to urinary tract infection with hematuria Aspiration PNA Patient presented with fever up to 100.4 F with tachycardia, tachypnea, and WBC elevated 31.6  meeting SIRS criteria.  Lactic acid 1.9 at the upper limit of normal.  Chest x-ray noted improvement of right lung base opacity.  Urinalysis concerning for infection, but also possibly contaminated as there was a elevated number of squamous epithelial cells present.  Most recent urine culture from 10/10 noted multiple species.  Blood and urine cultures were obtained.  Patient was placed on Rocephin and azithromycin. -Admit to a telemetry bed -Follow-up blood and urine cultures -Check procalcitonin -Continue empiric antibiotics of Rocephin and azithromycin -Recheck CBC in a.m.  Nausea and Vomiting  abdominal pain Acute.  Patient noted to report abdominal pain with episodes of nausea and vomiting. CT imaging noted dilated small loops of bowel with narrowing present.  Discussed case with Dr. Randel Pigg thought symptoms may be more so related to the bladder infection with hydronephrosis. -Strict I&Os and daily weights -N.p.o. will advance diet as tolerated -Normal saline IVFs  -Morphine IV as needed for pain  Acute kidney injury hydroureteronephrosis Improving.  Creatinine during last hospitalization had been elevated up to 7.8, but currently down to creatinine 2.93 with BUN 56 which is improved some from the prior check.  CT noted improvement , but persistent hydronephrosis and hydroureter bilaterally.  During last hospitalization patient had Foley catheter placed, but had been discontinued prior to patient being discharged  during last hospitalization. -Replace Foley catheter -Continue normal saline IV fluids   Dementia with behavior disturbance Daughter had not been given the patient Namenda and Aricept at home due to symptoms of nausea and vomiting. -Delirium precautions  Elevated liver enzymes Acute.  Alkaline phosphatase elevated at 241, AST 71, ALT 122, total bilirubin 2.2.  Suspect likely related to findings seen on the CT scan of the abdomen. -Recheck LFTs in a.m.  History of schizophrenia/seizure disorder -Continue Lamictal  GERD -Protonix IV   DVT prophylaxis: Lovenox Advance Care Planning:   Code Status: DNR   Consults:   Family Communication: Daughter updated at bedside  Severity of Illness: The appropriate patient status for this patient is INPATIENT. Inpatient status is judged to be reasonable and necessary in order to provide the required intensity of service to ensure the patient's safety. The patient's presenting symptoms, physical exam findings, and initial radiographic and laboratory data in the context of their chronic comorbidities is felt to place them at high risk for further clinical deterioration. Furthermore, it is not anticipated that the patient will be medically stable for discharge from the hospital within 2 midnights of admission.   * I certify that at the point of admission it is my clinical judgment that the patient will require inpatient hospital care spanning beyond 2 midnights from the point of admission due to high intensity of service, high risk for further deterioration and high frequency of surveillance required.*  Author: Norval Morton, MD 08/12/2022 8:31 AM  For on call review [email or web address].

## 2022-08-12 NOTE — ED Notes (Signed)
The pt was just discharged from [facility] hospital yesterday

## 2022-08-12 NOTE — Progress Notes (Addendum)
[facility])  Ms. Kobel was set to be admitted to hospice services today at 10am. Daughter called and stated that patient was having N/V and was second thinking hospice services. At this time hospice will plan to discharge services at this point due to family pursuing full treatment plan.   Feel free to call with any hospice questions.  Clementeen Hoof, RN, [facility] [phone]

## 2022-08-12 NOTE — ED Provider Notes (Signed)
[facility] EMERGENCY DEPARTMENT Provider Note   CSN: [id number] Arrival date & time: 08/11/22  2345     History  Chief Complaint  Patient presents with   Emesis    [name] is a 81 y.o. female.  The history is provided by the patient and medical records.  Emesis  Level V caveat: Advanced dementia  81 year old female with history of advanced dementia, hypertension, GERD, hyperlipidemia, recent admission for ARF, sepsis from UTI, presenting to the ED for abdominal distention and vomiting.  66 of history is provided by patient's daughter who is her power of attorney.  She was admitted to [facility] 10/10-10/12 for ARF and sepsis due to UTI.  She was treated with IV antibiotics, given IV fluids, and renal function did improve somewhat.  States she met with palliative care team while there and ultimately decided to be discharged home with in-home hospice, however this has not yet been set up.  Daughter is now second-guessing her decision as her mother appears to be suffering.  States today her abdomen has appeared very distended, she is not able to hold down medications, and has been vomiting.  States she was sent home with antibiotics but no pain medication and no further instructions about what to do for her.  She did try giving her some Pepto-Bismol, however she vomited this back up.  She has not been able to eat or drink anything since returning home yesterday.  She is also not had a bowel movement.  She did speak with hospice team today, however they could not come and evaluate her until tomorrow.  States this is only her evaluation, however her services may not be set up for another week.  Daughter would like her reevaluated and potentially readmitted, she would like to speak with specialist again about options for dialysis, alternative treatments, etc.  Home Medications Prior to Admission medications   Medication Sig Start Date End Date Taking? Authorizing  Provider  acetaminophen (TYLENOL) 500 MG tablet 580m at night and every 6 hours as needed. 01/10/22   PVivi Barrack MD  allopurinol (ZYLOPRIM) 300 MG tablet TAKE 1 TABLET BY MOUTH  DAILY 03/13/22   PVivi Barrack MD  amoxicillin-clavulanate (AUGMENTIN) 250-62.5 MG/5ML suspension Take 5 mLs (250 mg total) by mouth 2 (two) times daily for 4 days. 08/10/22 08/14/22  PCarlyle Lipa MD  ascorbic acid (VITAMIN C) 500 MG tablet Take 500 mg by mouth daily.    [provider]  atorvastatin (LIPITOR) 40 MG tablet TAKE 1 TABLET BY MOUTH  DAILY 02/17/22   PVivi Barrack MD  azithromycin (ZITHROMAX) 500 MG tablet Take 1 tablet (500 mg total) by mouth daily for 3 days. Take 1 tablet daily for 3 days. 08/10/22 08/13/22  PCarlyle Lipa MD  buPROPion (WELLBUTRIN XL) 300 MG 24 hr tablet Take 1 tablet (300 mg total) by mouth every morning. 03/28/22   Arfeen, SArlyce Harman MD  carvedilol (COREG) 3.125 MG tablet Take 1 tablet (3.125 mg total) by mouth 2 (two) times daily with a meal. 02/07/22   PFreada Bergeron MD  cyanocobalamin 1000 MCG tablet Take 1 tablet (1,000 mcg total) by mouth daily. 08/11/22 09/10/22  PCarlyle Lipa MD  diclofenac Sodium (VOLTAREN) 1 % GEL Apply 4 g topically 4 (four) times daily as needed (for pain).    [provider]  donepezil (ARICEPT) 5 MG tablet Take 1 tablet (5 mg total) by mouth at bedtime. 07/25/22 07/25/23  ADelice Lesch  Lezlie Octave, MD  fluticasone [facility]) 50 MCG/ACT nasal spray Place 1-2 sprays into both nostrils daily as needed for allergies or rhinitis.    [provider]  lamoTRIgine (LAMICTAL) 25 MG tablet Take 2 tablets twice a day 07/25/22   Cameron Sprang, MD  nystatin (MYCOSTATIN/NYSTOP) powder Apply 1 application. topically 3 (three) times daily as needed (yeast infection). 01/10/22   Vivi Barrack, MD  OLANZapine (ZYPREXA) 2.5 MG tablet Take 1 tab at bedtime 03/28/22   Arfeen, Arlyce Harman, MD  pantoprazole (PROTONIX) 40 MG tablet TAKE 1 TABLET  BY MOUTH TWICE  DAILY BEFORE MEALS Patient taking differently: Take 40 mg by mouth daily. 05/23/22   Vivi Barrack, MD  polyethylene glycol (MIRALAX / GLYCOLAX) 17 g packet Take 17 g by mouth daily. 08/11/22   Carlyle Lipa, MD      Allergies    Aspirin and Sulfamethoxazole-trimethoprim    Review of Systems   Review of Systems  Unable to perform ROS: Other    Physical Exam Updated Vital Signs Pulse (!) 101   Temp 99.7 F (37.6 C) (Oral)   Resp (!) 27   Ht 5' 4"  (1.626 m)   Wt 95.7 kg   SpO2 99%   BMI 36.21 kg/m   Physical Exam Vitals and nursing note reviewed.  Constitutional:      Appearance: She is well-developed.  HENT:     Head: Normocephalic and atraumatic.  Eyes:     Conjunctiva/sclera: Conjunctivae normal.     Pupils: Pupils are equal, round, and reactive to light.  Cardiovascular:     Rate and Rhythm: Normal rate and regular rhythm.     Heart sounds: Normal heart sounds.  Pulmonary:     Effort: Pulmonary effort is normal.     Breath sounds: Normal breath sounds.  Abdominal:     General: Bowel sounds are normal. There is distension.     Palpations: Abdomen is soft.     Comments: Appears distended, no rigidity  Musculoskeletal:        General: Normal range of motion.     Cervical back: Normal range of motion.  Skin:    General: Skin is warm and dry.  Neurological:     Mental Status: She is alert.     Comments: Awake, alert, not really able to follow commands     ED Results / Procedures / Treatments   Labs (all labs ordered are listed, but only abnormal results are displayed) Labs Reviewed  CBC WITH DIFFERENTIAL/PLATELET - Abnormal; Notable for the following components:      Result Value   WBC 31.6 (*)    RBC 3.57 (*)    Hemoglobin 11.2 (*)    HCT 35.8 (*)    MCV 100.3 (*)    RDW 16.2 (*)    Neutro Abs 27.8 (*)    Eosinophils Absolute 0.6 (*)    Abs Immature Granulocytes 0.60 (*)    All other components within normal limits   COMPREHENSIVE METABOLIC PANEL - Abnormal; Notable for the following components:   Chloride 112 (*)    CO2 17 (*)    BUN 56 (*)    Creatinine, Ser 2.93 (*)    Albumin 2.4 (*)    AST 71 (*)    ALT 122 (*)    Alkaline Phosphatase 241 (*)    Total Bilirubin 2.2 (*)    GFR, Estimated 16 (*)    All other components within normal limits  LIPASE, BLOOD -  Abnormal; Notable for the following components:   Lipase 153 (*)    All other components within normal limits  URINALYSIS, ROUTINE W REFLEX MICROSCOPIC - Abnormal; Notable for the following components:   Color, Urine AMBER (*)    APPearance TURBID (*)    Hgb urine dipstick MODERATE (*)    Protein, ur 30 (*)    Leukocytes,Ua MODERATE (*)    RBC / HPF >50 (*)    WBC, UA >50 (*)    Bacteria, UA MANY (*)    Non Squamous Epithelial 0-5 (*)    All other components within normal limits  URINE CULTURE  CULTURE, BLOOD (ROUTINE X 2)  CULTURE, BLOOD (ROUTINE X 2)  LACTIC ACID, PLASMA  LACTIC ACID, PLASMA    EKG None  Radiology DG Chest Port 1 View  Result Date: 08/12/2022 CLINICAL DATA:  Abdominal distention and vomiting. EXAM: PORTABLE CHEST 1 VIEW COMPARISON:  08/09/2022. FINDINGS: The heart size and mediastinal contours are stable. Mild airspace disease is present in the medial aspect of the right lung base. No effusion or pneumothorax. No acute osseous abnormality. IMPRESSION: Mild airspace disease at the right lung base, slightly improved from the prior exam. Electronically Signed   By: Brett Fairy M.D.   On: 08/12/2022 01:37   CT ABDOMEN PELVIS WO CONTRAST  Result Date: 08/12/2022 CLINICAL DATA:  Nausea and vomiting with abdominal pain, initial encounter EXAM: CT ABDOMEN AND PELVIS WITHOUT CONTRAST TECHNIQUE: Multidetector CT imaging of the abdomen and pelvis was performed following the standard protocol without IV contrast. RADIATION DOSE REDUCTION: This exam was performed according to the departmental dose-optimization program  which includes automated exposure control, adjustment of the mA and/or kV according to patient size and/or use of iterative reconstruction technique. COMPARISON:  08/08/2022 FINDINGS: Lower chest: No acute abnormality. Hepatobiliary: No focal liver abnormality is seen. Status post cholecystectomy. No biliary dilatation. Pancreas: Unremarkable. No pancreatic ductal dilatation or surrounding inflammatory changes. Spleen: Normal in size without focal abnormality. Adrenals/Urinary Tract: Adrenal glands are within normal limits. Kidneys are well visualized bilaterally. Previously seen hydronephrosis and hydroureter has improved significantly in the interval from the prior exam. Bladder remains distended with wall thickening and inflammatory changes anteriorly. Some air is noted along the anterior border of the bladder new from the prior exam. This may be related to recent instrumentation and diverticular change in this region. Stomach/Bowel: Mild diverticular change of the colon is noted. Colon is predominately decompressed. The appendix is not well seen although no inflammatory changes are noted. Stomach is well distended with ingested fluid. Duodenum and proximal jejunum are also distended with fluid this extends inferiorly towards the inflammatory change surrounding the bladder and has increased in the interval from the prior exam. Some focal narrowing in the distal small bowel is likely present related to the inflammatory change of the bladder. Vascular/Lymphatic: Aortic atherosclerosis. No enlarged abdominal or pelvic lymph nodes. Reproductive: Status post hysterectomy. No adnexal masses. Other: No abdominal wall hernia or abnormality. No abdominopelvic ascites. Musculoskeletal: Bilateral hip replacements are noted. No acute bony abnormality is noted. Degenerative changes of the lumbar spine are seen. IMPRESSION: Persistent inflammatory change of the anterior aspect of the bladder with findings suspicious for  diverticular change. These changes correspond with the given clinical history of UTI and are very similar to that seen on the prior study. Diverticulosis without diverticulitis. Persistent and slightly worsened fluid-filled dilated loops of small bowel which lead towards the inflammatory changes involving the bladder. There is likely some mild  narrowing of the distal small bowel in this region causing the more proximal dilatation. Improved but persistent hydronephrosis and hydroureter bilaterally Electronically Signed   By: Inez Catalina M.D.   On: 08/12/2022 00:45    Procedures Procedures    CRITICAL CARE Performed by: Larene Pickett   Total critical care time: 45 minutes  Critical care time was exclusive of separately billable procedures and treating other patients.  Critical care was necessary to treat or prevent imminent or life-threatening deterioration.  Critical care was time spent personally by me on the following activities: development of treatment plan with patient and/or surrogate as well as nursing, discussions with consultants, evaluation of patient's response to treatment, examination of patient, obtaining history from patient or surrogate, ordering and performing treatments and interventions, ordering and review of laboratory studies, ordering and review of radiographic studies, pulse oximetry and re-evaluation of patient's condition.   Medications Ordered in ED Medications  cefTRIAXone (ROCEPHIN) 1 g in sodium chloride 0.9 % 100 mL IVPB (has no administration in time range)  azithromycin (ZITHROMAX) 500 mg in sodium chloride 0.9 % 250 mL IVPB (has no administration in time range)  fentaNYL (SUBLIMAZE) injection 50 mcg (has no administration in time range)  acetaminophen (TYLENOL) tablet 650 mg (650 mg Oral Given 08/12/22 0310)    ED Course/ Medical Decision Making/ A&P                           Medical Decision Making Amount and/or Complexity of Data Reviewed Labs:  ordered. Radiology: ordered and independent interpretation performed. ECG/medicine tests: ordered and independent interpretation performed.  Risk OTC drugs. Prescription drug management. Decision regarding hospitalization.   81 year old female presenting to the ED with daughter for vomiting and failure to thrive.  Just discharged from [facility] yesterday after admission for acute renal failure, sepsis due to UTI, and possible pneumonia.  She has been unable to tolerate p.o. since leaving facility and daughter reports she is very uncomfortable.  She was to be set up with home hospice, however this has not occurred yet.  On exam, patient is severely demented.  She is not able to provide any history.  Her abdomen does appear distended but is soft without rigidity.  She does have a low-grade tachycardia around 105 and is somewhat warm to the touch.  Goals of care discussed with daughter at bedside who is her power of attorney.  She is second-guessing her decision to send her home with hospice as she was under the impression she would be receiving around-the-clock care which is not the case.  States she is concerned she may be declining again and would like her to be reevaluated/readmitted for second opinion.  States she was not very happy with palliative care team at [facility], information provided to her she reports was not very clear.  We will proceed with work-up including labs, cultures, CT, portable chest x-ray.  Recent COVID testing was negative.  Patient with leukocytosis of 31,000 (was down to 17K during last hospitalization).  Normal lactate.  SrCr however is improved to 2.93 (down from 3.5 previously).  Lipase 153-- unclear significance.  UA still appears infected with many bacteria.  Repeat culture along with blood cultures sent.  CXR with continued findings of possible PNA.  CT w/out contrast due to renal function-- bowel dilation without transition point to suggest SBO.    Results discussed with  daughter--she would like to continue treatment for UTI/pneumonia.  We discussed  options of admission, she would prefer this and to have repeat discussion with palliative care team regarding hospice versus alternative measures.  This seems reasonable.  We will restart Rocephin/azithromycin.  Discussed with Dr. Alcario Drought-- will admit for ongoing care.  Plans to have another goals of care discussion with daughter to better manage expectations.  Final Clinical Impression(s) / ED Diagnoses Final diagnoses:  Sepsis with acute renal failure, due to unspecified organism, unspecified acute renal failure type, unspecified whether septic shock present [facility])  Community acquired pneumonia, unspecified laterality  Acute cystitis with hematuria    Rx / DC Orders ED Discharge Orders     None         Larene Pickett, PA-C 08/12/22 [id number]    Merrily Pew, MD 08/12/22 [phone]

## 2022-08-12 NOTE — ED Notes (Signed)
Pt taken to ct 

## 2022-08-13 DIAGNOSIS — N179 Acute kidney failure, unspecified: Secondary | ICD-10-CM | POA: Diagnosis not present

## 2022-08-13 DIAGNOSIS — A419 Sepsis, unspecified organism: Secondary | ICD-10-CM | POA: Diagnosis not present

## 2022-08-13 DIAGNOSIS — F03918 Unspecified dementia, unspecified severity, with other behavioral disturbance: Secondary | ICD-10-CM | POA: Diagnosis not present

## 2022-08-13 DIAGNOSIS — J690 Pneumonitis due to inhalation of food and vomit: Secondary | ICD-10-CM | POA: Diagnosis not present

## 2022-08-13 LAB — CBC WITH DIFFERENTIAL/PLATELET
Abs Immature Granulocytes: 1.46 10*3/uL — ABNORMAL HIGH (ref 0.00–0.07)
Basophils Absolute: 0.1 10*3/uL (ref 0.0–0.1)
Basophils Relative: 0 %
Eosinophils Absolute: 0.5 10*3/uL (ref 0.0–0.5)
Eosinophils Relative: 2 %
HCT: 34.6 % — ABNORMAL LOW (ref 36.0–46.0)
Hemoglobin: 11.2 g/dL — ABNORMAL LOW (ref 12.0–15.0)
Immature Granulocytes: 5 %
Lymphocytes Relative: 9 %
Lymphs Abs: 2.6 10*3/uL (ref 0.7–4.0)
MCH: 31.5 pg (ref 26.0–34.0)
MCHC: 32.4 g/dL (ref 30.0–36.0)
MCV: 97.5 fL (ref 80.0–100.0)
Monocytes Absolute: 1.1 10*3/uL — ABNORMAL HIGH (ref 0.1–1.0)
Monocytes Relative: 4 %
Neutro Abs: 23.9 10*3/uL — ABNORMAL HIGH (ref 1.7–7.7)
Neutrophils Relative %: 80 %
Platelets: 331 10*3/uL (ref 150–400)
RBC: 3.55 MIL/uL — ABNORMAL LOW (ref 3.87–5.11)
RDW: 16.6 % — ABNORMAL HIGH (ref 11.5–15.5)
WBC: 29.7 10*3/uL — ABNORMAL HIGH (ref 4.0–10.5)
nRBC: 0.1 % (ref 0.0–0.2)

## 2022-08-13 LAB — PROCALCITONIN: Procalcitonin: 1.42 ng/mL

## 2022-08-13 LAB — COMPREHENSIVE METABOLIC PANEL
ALT: 92 U/L — ABNORMAL HIGH (ref 0–44)
AST: 56 U/L — ABNORMAL HIGH (ref 15–41)
Albumin: 2.3 g/dL — ABNORMAL LOW (ref 3.5–5.0)
Alkaline Phosphatase: 230 U/L — ABNORMAL HIGH (ref 38–126)
Anion gap: 14 (ref 5–15)
BUN: 48 mg/dL — ABNORMAL HIGH (ref 8–23)
CO2: 15 mmol/L — ABNORMAL LOW (ref 22–32)
Calcium: 9 mg/dL (ref 8.9–10.3)
Chloride: 117 mmol/L — ABNORMAL HIGH (ref 98–111)
Creatinine, Ser: 2.31 mg/dL — ABNORMAL HIGH (ref 0.44–1.00)
GFR, Estimated: 21 mL/min — ABNORMAL LOW (ref >60)
Glucose, Bld: 74 mg/dL (ref 70–99)
Potassium: 4.8 mmol/L (ref 3.5–5.1)
Sodium: 146 mmol/L — ABNORMAL HIGH (ref 135–145)
Total Bilirubin: 1.9 mg/dL — ABNORMAL HIGH (ref 0.3–1.2)
Total Protein: 6.1 g/dL — ABNORMAL LOW (ref 6.5–8.1)

## 2022-08-13 LAB — URINE CULTURE: Culture: 60000 — AB

## 2022-08-13 LAB — CULTURE, BLOOD (ROUTINE X 2)
Culture: NO GROWTH
Culture: NO GROWTH

## 2022-08-13 LAB — LIPASE, BLOOD: Lipase: 66 U/L — ABNORMAL HIGH (ref 11–51)

## 2022-08-13 MED ORDER — OXYCODONE HCL 5 MG PO TABS
5.0000 mg | ORAL_TABLET | Freq: Four times a day (QID) | ORAL | Status: DC | PRN
  Administered 2022-08-14 – 2022-08-16 (×3): 5 mg via ORAL
  Filled 2022-08-13 (×3): qty 1

## 2022-08-13 MED ORDER — HYDROMORPHONE HCL 1 MG/ML IJ SOLN
0.5000 mg | INTRAMUSCULAR | Status: DC | PRN
  Administered 2022-08-13 – 2022-08-17 (×6): 0.5 mg via INTRAVENOUS
  Filled 2022-08-13 (×5): qty 0.5
  Filled 2022-08-13: qty 1

## 2022-08-13 MED ORDER — FENTANYL CITRATE PF 50 MCG/ML IJ SOSY: 25.0000 ug | PREFILLED_SYRINGE | INTRAMUSCULAR | Status: DC | PRN

## 2022-08-13 MED ORDER — ONDANSETRON HCL 4 MG/2ML IJ SOLN
4.0000 mg | Freq: Four times a day (QID) | INTRAMUSCULAR | Status: DC | PRN
  Administered 2022-08-13 – 2022-08-17 (×2): 4 mg via INTRAVENOUS
  Filled 2022-08-13 (×2): qty 2

## 2022-08-13 NOTE — Progress Notes (Signed)
PROGRESS NOTE  [name] QIW:[id number] DOB: [birth year] DOA: 08/11/2022 PCP: Vivi Barrack, MD  HPI/Recap of past 24 hours: [name] is a 81 y.o. female with medical history significant of hypertension, dyslipidemia, diastolic CHF, diverticulosis, GERD, gout, TIA, seizure disorder, and severe dementia with behavioral disturbance who presents with complaints of vomiting.  History is obtained from patient's daughter at bedside. Hospitalized 10/9-10/12 with acute renal failure and sepsis thought secondary to UTI and aspiration pneumonia. Palliative care was consulted to help establish goals of care and ultimately patient was discharged home with hospice. After getting home patient had developed abdominal pain and vomiting. Patient appeared to be in significant pain. In the ED, pt was febrile up to 100.4 F with tachycardia and tachypnea.  Labs significant for WBC 31.6, alkaline phosphatase 241, albumin 2.4, lipase 153, AST 71, ALT 122, and total bilirubin 2.2.  Chest x-ray noted mild airspace disease in the right lung base slightly improved from prior exam.  Urinalysis suspicious for UTI, although noted squamous cells. Blood and urine cultures have been obtained.  Patient was admitted for further management.    Today, had an extensive conversation with daughter at bedside about overall plan of care.  Daughter very concerned about patient's pain and relates this to UTI.  Patient noted to have some gurgling/sounding wet.  Appeared to be uncomfortable/in pain    Assessment/Plan: Principal Problem:   Sepsis secondary to UTI [facility]) Active Problems:   Aspiration pneumonia (HCC)   Nausea and vomiting   Abdominal pain   AKI (acute kidney injury) ([place])   Hydroureteronephrosis   Dementia with behavioral disturbance (HCC)   Elevated liver enzymes   Seizure disorder (HCC)   GERD   Sepsis due to possible UTI Vs Aspiration PNA On presentation, fever up to 100.4 F with tachycardia,  tachypnea, elevated WBC Chest x-ray noted improvement of right lung base opacity UA concerning for infection, but also possibly contaminated as there was a elevated number of squamous epithelial cells UC and BC pending Procalcitonin elevated 1.42, will trend Continue empiric antibiotics of Rocephin and azithromycin, pending UC Daily CBC   Nausea and Vomiting  abdominal pain CT imaging noted dilated small loops of bowel with narrowing present Case was discussed with Dr. Randel Pigg who thought symptoms may be related to the bladder infection with hydronephrosis N.p.o, will advance diet as tolerated Pain management   Acute kidney injury hydroureteronephrosis Cr during last hospitalization had been elevated up to 7.8, but currently trending downwards CT noted improvement, but persistent hydronephrosis and hydroureter bilaterally Replace Foley catheter D/C IV fluids Plan for urology consultation  Elevated liver enzymes Suspect likely related to findings seen on the CT scan of the abdomen Vs sepsis Vs ??fluid overload Trend LFTs  Chronic diastolic HF ??Acute on on chronic, appears somewhat overloaded, difficult to determine volume status Noted abdominal distention with some tachypnea BNP pending CXR without any pulmonary edema Last echo done was in 2021, which showed EF of 65 to 70%, no regional wall motion abnormality, grade 1 diastolic dysfunction  GERD Protonix IV   Dementia with behavior disturbance Namenda and Aricept once able Delirium precautions  History of schizophrenia/seizure disorder Continue Lamictal  Obesity          Estimated body mass index is 36.21 kg/m as calculated from the following:   Height as of this encounter: '5\' 4"'$  (1.626 m).   Weight as of this encounter: 95.7 kg.     Code Status: DNR  Family Communication:  Discussed extensively with daughter at bedside  Disposition Plan: Status is: Inpatient Remains inpatient appropriate because: Level  of care      Consultants: None  Procedures: None  Antimicrobials: Ceftriaxone Azithromycin  DVT prophylaxis: Heparin [place]   Objective: Vitals:   08/13/22 0530 08/13/22 0854 08/13/22 0855 08/13/22 1229  BP: 133/88 (!) 137/91 (!) 137/91 120/71  Pulse: 100 (!) 108 (!) 109 98  Resp: (!) 29  20 (!) 22  Temp: 98.4 F (36.9 C)  99.2 F (37.3 C) 99.7 F (37.6 C)  TempSrc: Oral  Oral Oral  SpO2: 95%  96% 97%  Weight:      Height:        Intake/Output Summary (Last 24 hours) at 08/13/2022 1416 Last data filed at 08/13/2022 1035 Gross per 24 hour  Intake 1497.83 ml  Output 1700 ml  Net -202.17 ml   Filed Weights   08/11/22 2352  Weight: 95.7 kg    Exam: General: NAD, intermittently appears uncomfortable, restless Cardiovascular: S1, S2 present Respiratory: Diminished breath sounds, sounds congested with rales noted Abdomen: Soft, nontender, +distended, bowel sounds present Musculoskeletal: No bilateral pedal edema noted Skin: Normal Psychiatry: Unable to assess    Data Reviewed: CBC: Recent Labs  Lab 08/07/22 2109 08/09/22 0855 08/12/22 0100 08/13/22 0855  WBC 33.7* 17.1* 31.6* 29.7*  NEUTROABS 29.4*  --  27.8* 23.9*  HGB 11.5* 9.3* 11.2* 11.2*  HCT 37.9 29.7* 35.8* 34.6*  MCV 101.1* 97.1 100.3* 97.5  PLT 301 301 349 245   Basic Metabolic Panel: Recent Labs  Lab 08/08/22 1110 08/08/22 2029 08/09/22 0234 08/09/22 0855 08/09/22 1424 08/09/22 1812 08/10/22 0629 08/12/22 0100 08/13/22 0855  NA 143 145  --  149*  --   --   --  143 146*  K 5.9* 5.0  5.0   < > 4.8 4.3 4.5 3.9 4.9 4.8  CL 112* 117*  --  119*  --   --   --  112* 117*  CO2 16* 18*  --  18*  --   --   --  17* 15*  GLUCOSE 95 107*  --  99  --   --   --  80 74  BUN 114* 111*  --  102*  --   --   --  56* 48*  CREATININE 7.83* 6.33*  --  5.26*  --   --  3.29* 2.93* 2.31*  CALCIUM 8.9 8.8*  --  8.8*  --   --   --  9.3 9.0  PHOS 5.7*  --   --  5.2*  --   --   --   --   --    < > =  values in this interval not displayed.   GFR: Estimated Creatinine Clearance: 21.8 mL/min (A) (by C-G formula based on SCr of 2.31 mg/dL (H)). Liver Function Tests: Recent Labs  Lab 08/08/22 0515 08/08/22 1110 08/09/22 0855 08/12/22 0100 08/13/22 0855  AST 121*  --   --  71* 56*  ALT 149*  --   --  122* 92*  ALKPHOS 213*  --   --  241* 230*  BILITOT 1.7*  --   --  2.2* 1.9*  PROT 6.6  --   --  7.3 6.1*  ALBUMIN 2.5* 2.8* 2.2* 2.4* 2.3*   Recent Labs  Lab 08/12/22 0100 08/13/22 0855  LIPASE 153* 66*   No results for input(s): "AMMONIA" in the last 168 hours. Coagulation Profile: No results for input(s): "  INR", "PROTIME" in the last 168 hours. Cardiac Enzymes: No results for input(s): "CKTOTAL", "CKMB", "CKMBINDEX", "TROPONINI" in the last 168 hours. BNP (last 3 results) No results for input(s): "PROBNP" in the last 8760 hours. HbA1C: No results for input(s): "HGBA1C" in the last 72 hours. CBG: Recent Labs  Lab 08/08/22 1606 08/08/22 1706 08/09/22 0854  GLUCAP 99 159* 99   Lipid Profile: No results for input(s): "CHOL", "HDL", "LDLCALC", "TRIG", "CHOLHDL", "LDLDIRECT" in the last 72 hours. Thyroid Function Tests: No results for input(s): "TSH", "T4TOTAL", "FREET4", "T3FREE", "THYROIDAB" in the last 72 hours. Anemia Panel: No results for input(s): "VITAMINB12", "FOLATE", "FERRITIN", "TIBC", "IRON", "RETICCTPCT" in the last 72 hours. Urine analysis:    Component Value Date/Time   COLORURINE AMBER (A) 08/12/2022 0220   APPEARANCEUR TURBID (A) 08/12/2022 0220   APPEARANCEUR Clear 05/07/2017 1614   LABSPEC 1.017 08/12/2022 0220   PHURINE 5.0 08/12/2022 0220   GLUCOSEU NEGATIVE 08/12/2022 0220   GLUCOSEU NEGATIVE 07/31/2017 1511   HGBUR MODERATE (A) 08/12/2022 0220   HGBUR trace-intact 11/28/2007 1531   BILIRUBINUR NEGATIVE 08/12/2022 0220   BILIRUBINUR Negative 09/01/2019 1532   BILIRUBINUR Negative 05/07/2017 1614   KETONESUR NEGATIVE 08/12/2022 0220    PROTEINUR 30 (A) 08/12/2022 0220   UROBILINOGEN 0.2 09/01/2019 1532   UROBILINOGEN 0.2 11/04/2017 1904   NITRITE NEGATIVE 08/12/2022 0220   LEUKOCYTESUR MODERATE (A) 08/12/2022 0220   Sepsis Labs: '@LABRCNTIP'$ (procalcitonin:4,lacticidven:4)  ) Recent Results (from the past 240 hour(s))  SARS Coronavirus 2 by RT PCR (hospital order, performed in [facility] hospital lab) *cepheid single result test*     Status: None   Collection Time: 08/08/22 12:11 AM   Specimen: Nasal Swab  Result Value Ref Range Status   SARS Coronavirus 2 by RT PCR NEGATIVE NEGATIVE Final    Comment: (NOTE) SARS-CoV-2 target nucleic acids are NOT DETECTED.  The SARS-CoV-2 RNA is generally detectable in upper and lower respiratory specimens during the acute phase of infection. The lowest concentration of SARS-CoV-2 viral copies this assay can detect is 250 copies / mL. A negative result does not preclude SARS-CoV-2 infection and should not be used as the sole basis for treatment or other patient management decisions.  A negative result may occur with improper specimen collection / handling, submission of specimen other than nasopharyngeal swab, presence of viral mutation(s) within the areas targeted by this assay, and inadequate number of viral copies (<250 copies / mL). A negative result must be combined with clinical observations, patient history, and epidemiological information.  Fact Sheet for Patients:   https://www.patel.info/  Fact Sheet for Healthcare Providers: https://hall.com/  This test is not yet approved or  cleared by the [place] FDA and has been authorized for detection and/or diagnosis of SARS-CoV-2 by FDA under an Emergency Use Authorization (EUA).  This EUA will remain in effect (meaning this test can be used) for the duration of the COVID-19 declaration under Section 564(b)(1) of the Act, 21 U.S.C. section 360bbb-3(b)(1), unless the  authorization is terminated or revoked sooner.  Performed at [facility], [address] [place], [place] [address]   Blood culture (routine x 2)     Status: None   Collection Time: 08/08/22 12:11 AM   Specimen: BLOOD  Result Value Ref Range Status   Specimen Description BLOOD BLOOD RIGHT ARM  Final   Special Requests   Final    BOTTLES DRAWN AEROBIC AND ANAEROBIC Blood Culture results may not be optimal due to an inadequate volume of blood  received in culture bottles   Culture   Final    NO GROWTH 5 DAYS Performed at [facility], [address] [place], [place] [address]    Report Status 08/13/2022 FINAL  Final  Blood culture (routine x 2)     Status: None   Collection Time: 08/08/22 12:11 AM   Specimen: BLOOD  Result Value Ref Range Status   Specimen Description BLOOD BLOOD LEFT ARM  Final   Special Requests   Final    BOTTLES DRAWN AEROBIC AND ANAEROBIC Blood Culture results may not be optimal due to an inadequate volume of blood received in culture bottles   Culture   Final    NO GROWTH 5 DAYS Performed at [facility], [address] [place], [place] [address]    Report Status 08/13/2022 FINAL  Final  Urine Culture     Status: Abnormal   Collection Time: 08/08/22 12:11 AM   Specimen: Urine, Clean Catch  Result Value Ref Range Status   Specimen Description   Final    URINE, CLEAN CATCH Performed at [facility], [address] [place], [place] [address]    Special Requests   Final    NONE Performed at [facility], [address], [place], [place] [address]    Culture MULTIPLE SPECIES PRESENT, SUGGEST RECOLLECTION (A)  Final   Report Status 08/09/2022 FINAL  Final  Blood culture (routine x 2)     Status: None (Preliminary result)   Collection Time: 08/12/22  1:00 AM   Specimen: BLOOD LEFT ARM  Result Value Ref Range Status   Specimen Description BLOOD LEFT ARM  Final   Special Requests   Final    BOTTLES  DRAWN AEROBIC AND ANAEROBIC Blood Culture adequate volume   Culture   Final    NO GROWTH < 12 HOURS Performed at [facility], [place] [address], [place], [place] [address]    Report Status PENDING  Incomplete      Studies: No results found.  Scheduled Meds:  carvedilol  3.125 mg Oral BID WC   heparin  5,000 Units Subcutaneous Q8H   lamoTRIgine  50 mg Oral BID   pantoprazole (PROTONIX) IV  40 mg Intravenous Q24H   sodium chloride flush  3 mL Intravenous Q12H    Continuous Infusions:  azithromycin Stopped (08/13/22 1235)   cefTRIAXone (ROCEPHIN)  IV Stopped (08/13/22 1035)     LOS: 1 day     Alma Friendly, MD [facility]  If 7PM-7AM, please contact night-coverage [email or web address] 08/13/2022, 2:16 PM

## 2022-08-13 NOTE — ED Notes (Signed)
Enema performed at bedside, with assist of two other Rns. Tolerated well.

## 2022-08-14 DIAGNOSIS — N179 Acute kidney failure, unspecified: Secondary | ICD-10-CM | POA: Diagnosis not present

## 2022-08-14 DIAGNOSIS — F03918 Unspecified dementia, unspecified severity, with other behavioral disturbance: Secondary | ICD-10-CM | POA: Diagnosis not present

## 2022-08-14 DIAGNOSIS — J690 Pneumonitis due to inhalation of food and vomit: Secondary | ICD-10-CM | POA: Diagnosis not present

## 2022-08-14 DIAGNOSIS — A419 Sepsis, unspecified organism: Secondary | ICD-10-CM | POA: Diagnosis not present

## 2022-08-14 LAB — GASTROINTESTINAL PANEL BY PCR, STOOL (REPLACES STOOL CULTURE)

## 2022-08-14 LAB — COMPREHENSIVE METABOLIC PANEL
ALT: 79 U/L — ABNORMAL HIGH (ref 0–44)
AST: 41 U/L (ref 15–41)
Albumin: 2 g/dL — ABNORMAL LOW (ref 3.5–5.0)
Alkaline Phosphatase: 215 U/L — ABNORMAL HIGH (ref 38–126)
Anion gap: 12 (ref 5–15)
BUN: 45 mg/dL — ABNORMAL HIGH (ref 8–23)
CO2: 20 mmol/L — ABNORMAL LOW (ref 22–32)
Calcium: 9.4 mg/dL (ref 8.9–10.3)
Chloride: 119 mmol/L — ABNORMAL HIGH (ref 98–111)
Creatinine, Ser: 1.92 mg/dL — ABNORMAL HIGH (ref 0.44–1.00)
GFR, Estimated: 26 mL/min — ABNORMAL LOW (ref >60)
Glucose, Bld: 94 mg/dL (ref 70–99)
Potassium: 5 mmol/L (ref 3.5–5.1)
Sodium: 151 mmol/L — ABNORMAL HIGH (ref 135–145)
Total Bilirubin: 1.4 mg/dL — ABNORMAL HIGH (ref 0.3–1.2)
Total Protein: 6.8 g/dL (ref 6.5–8.1)

## 2022-08-14 LAB — CBC WITH DIFFERENTIAL/PLATELET
Abs Immature Granulocytes: 1.27 10*3/uL — ABNORMAL HIGH (ref 0.00–0.07)
Basophils Absolute: 0.1 10*3/uL (ref 0.0–0.1)
Basophils Relative: 0 %
Eosinophils Absolute: 0.5 10*3/uL (ref 0.0–0.5)
Eosinophils Relative: 2 %
HCT: 31.2 % — ABNORMAL LOW (ref 36.0–46.0)
Hemoglobin: 10.3 g/dL — ABNORMAL LOW (ref 12.0–15.0)
Immature Granulocytes: 5 %
Lymphocytes Relative: 10 %
Lymphs Abs: 2.6 10*3/uL (ref 0.7–4.0)
MCH: 31.6 pg (ref 26.0–34.0)
MCHC: 33 g/dL (ref 30.0–36.0)
MCV: 95.7 fL (ref 80.0–100.0)
Monocytes Absolute: 1.1 10*3/uL — ABNORMAL HIGH (ref 0.1–1.0)
Monocytes Relative: 4 %
Neutro Abs: 20 10*3/uL — ABNORMAL HIGH (ref 1.7–7.7)
Neutrophils Relative %: 79 %
Platelets: 334 10*3/uL (ref 150–400)
RBC: 3.26 MIL/uL — ABNORMAL LOW (ref 3.87–5.11)
RDW: 16.8 % — ABNORMAL HIGH (ref 11.5–15.5)
WBC: 25.5 10*3/uL — ABNORMAL HIGH (ref 4.0–10.5)
nRBC: 0.2 % (ref 0.0–0.2)

## 2022-08-14 LAB — C DIFFICILE QUICK SCREEN W PCR REFLEX
C Diff antigen: NEGATIVE
C Diff interpretation: NOT DETECTED
C Diff toxin: NEGATIVE

## 2022-08-14 LAB — LIPASE, BLOOD: Lipase: 48 U/L (ref 11–51)

## 2022-08-14 LAB — BRAIN NATRIURETIC PEPTIDE: B Natriuretic Peptide: 58 pg/mL (ref 0.0–100.0)

## 2022-08-14 LAB — PROCALCITONIN: Procalcitonin: 1.08 ng/mL

## 2022-08-14 MED ORDER — FLUCONAZOLE 150 MG PO TABS
150.0000 mg | ORAL_TABLET | Freq: Once | ORAL | Status: AC
  Administered 2022-08-14: 150 mg via ORAL
  Filled 2022-08-14: qty 1

## 2022-08-14 MED ORDER — ORAL CARE MOUTH RINSE: 15.0000 mL | OROMUCOSAL | Status: DC | PRN

## 2022-08-14 MED ORDER — CHLORHEXIDINE GLUCONATE CLOTH 2 % EX PADS
6.0000 | MEDICATED_PAD | Freq: Every day | CUTANEOUS | Status: DC
  Administered 2022-08-14 – 2022-08-17 (×4): 6 via TOPICAL

## 2022-08-14 MED ORDER — SIMETHICONE 80 MG PO CHEW
80.0000 mg | CHEWABLE_TABLET | Freq: Once | ORAL | Status: AC
  Administered 2022-08-14: 80 mg via ORAL
  Filled 2022-08-14: qty 1

## 2022-08-14 MED ORDER — DEXTROSE 5 % IV SOLN: INTRAVENOUS | Status: AC

## 2022-08-14 NOTE — Evaluation (Signed)
Physical Therapy Evaluation Patient Details Name: [name] MRN: [id number] DOB: 1941-08-24 Today's Date: 08/14/2022  History of Present Illness  Patient is a 81 y/o female who presents on 10/13 from home due to distended abdomen and vomiting, recently admitted 10/10-10/12 at [facility] wih ARF, sepsis with UTI and aspiration PNA- supposed to be d/ced with in home hospice. PMH includes advanced dementia, non verbal, CHF, CKD, HTN, COPD, depression, ovarian ca, seizures, chronic venous insufficiency, mitral valve prolapse and SDH.  Clinical Impression  Patient presents with generalized weakness, pain, baseline cognitive deficits due to hx of advanced dementia and impaired mobility s/p above. Pt is from home with daughter who is her caregiver 24/7. Pt is bed bound at baseline since Sept 11 and has an aide assist with ADLs 3 days/week. Pt requires assist for all care. Requires total A of 2 for bed mobility and Max A-Min guard assist for sitting balance EOB. Pt fatigues quickly. Follows a few 1 step commands. Recommend hoyer lift at home to assist daughter with being able to safely transfer pt OOB to her w/c and BSC safely. Pt does not require further skilled therapy services as pt is functioning at baseline and has necessary support at home. Pt is not a good candidate for therapy due to advanced dementia. All education completed. Discharge from therapy.   Recommendations for follow up therapy are one component of a multi-disciplinary discharge planning process, led by the attending physician.  Recommendations may be updated based on patient status, additional functional criteria and insurance authorization.  Follow Up Recommendations No PT follow up      Assistance Recommended at Discharge Frequent or constant Supervision/Assistance  Patient can return home with the following  Two people to help with walking and/or transfers;Two people to help with bathing/dressing/bathroom;Assistance with  feeding    Equipment Recommendations Other (comment) (hoyer lift)  Recommendations for Other Services       Functional Status Assessment Patient has not had a recent decline in their functional status     Precautions / Restrictions Precautions Precautions: Fall Restrictions Weight Bearing Restrictions: No      Mobility  Bed Mobility Overal bed mobility: Needs Assistance Bed Mobility: Supine to Sit, Sit to Supine     Supine to sit: Total assist, +2 for physical assistance, HOB elevated Sit to supine: Total assist, +2 for physical assistance, HOB elevated   General bed mobility comments: total A using helicoptor technique to get to EOB and to return to supine.    Transfers                   General transfer comment: Unable    Ambulation/Gait               General Gait Details: Unable  Stairs            Wheelchair Mobility    Modified Rankin (Stroke Patients Only)       Balance Overall balance assessment: Needs assistance, History of Falls Sitting-balance support: Feet supported, Single extremity supported Sitting balance-Leahy Scale: Poor Sitting balance - Comments: Varying assist needed from Max A to moments of Min guard assist, favors left lateral lean and posterior lean, able to initate forward movement but not sustain esp when fatigued. Postural control: Posterior lean, Left lateral lean                                   Pertinent  Vitals/Pain Pain Assessment Pain Assessment: Faces Faces Pain Scale: Hurts little more Pain Location: grimacing in beginning of session, not after mobility Pain Descriptors / Indicators: Grimacing Pain Intervention(s): Monitored during session, Repositioned    Home Living Family/patient expects to be discharged to:: Private residence Living Arrangements: Children (daughter) Available Help at Discharge: Family;Available 24 hours/day Type of Home: House Home Access: Level entry        Home Layout: One level Home Equipment: Conservation officer, nature (2 wheels);BSC/3in1;Hospital bed;Wheelchair - manual;Tub bench Additional Comments: lift chair    Prior Function Prior Level of Function : Needs assist             Mobility Comments: pt has not been ambulatory since Sept 11th, mainly bedbound and sometimes transferring with assist from daughter/aide but lots of falls. ADLs Comments: assist with all care including feeding recently. has an aide come 3x/week to assist with dressing/bathing/transfers.     Hand Dominance   Dominant Hand: Left    Extremity/Trunk Assessment   Upper Extremity Assessment Upper Extremity Assessment: Defer to OT evaluation RUE Deficits / Details: reflexive grasp LUE Deficits / Details: reflexive grasp    Lower Extremity Assessment Lower Extremity Assessment: Generalized weakness;Difficult to assess due to impaired cognition (Limited AROM noted throughout LEs esp to command. Able to wiggle toes, perform PROM of knees/hips)    Cervical / Trunk Assessment Cervical / Trunk Assessment: Other exceptions Cervical / Trunk Exceptions: obesity, weakness  Communication   Communication:  (nonverbal for the last week per daughter)  Cognition Arousal/Alertness: Awake/alert Behavior During Therapy: Flat affect Overall Cognitive Status: History of cognitive impairments - at baseline                                 General Comments: Pt newly nonverbal the last week since UTi diagnosis. Follows some 1 step commands. Per daughter, seems to be improving with cognition slightly, hx of advanced dementia.        General Comments General comments (skin integrity, edema, etc.): daughter present and assisted with all info and PLOF. Pt with multiple burps and belches once sitting EOB and stomach seemed less distended post session with no grimacing, question sgas causing abdominal pain? RN aware.    Exercises     Assessment/Plan    PT Assessment  Patient does not need any further PT services  PT Problem List         PT Treatment Interventions      PT Goals (Current goals can be found in the Care Plan section)  Acute Rehab PT Goals Patient Stated Goal: pt unable, per daughter to decrease pt's pain/discomfort PT Goal Formulation: Patient unable to participate in goal setting Time For Goal Achievement: 08/28/22 Potential to Achieve Goals: Fair    Frequency       Co-evaluation   Reason for Co-Treatment: Complexity of the patient's impairments (multi-system involvement);Necessary to address cognition/behavior during functional activity   OT goals addressed during session: ADL's and self-care       AM-PAC PT "6 Clicks" Mobility  Outcome Measure Help needed turning from your back to your side while in a flat bed without using bedrails?: Total Help needed moving from lying on your back to sitting on the side of a flat bed without using bedrails?: Total Help needed moving to and from a bed to a chair (including a wheelchair)?: Total Help needed standing up from a chair using your arms (e.g., wheelchair  or bedside chair)?: Total Help needed to walk in hospital room?: Total Help needed climbing 3-5 steps with a railing? : Total 6 Click Score: 6    End of Session   Activity Tolerance: Patient tolerated treatment well Patient left: in bed;with call bell/phone within reach;with bed alarm set;with family/visitor present Nurse Communication: Mobility status;Need for lift equipment PT Visit Diagnosis: Pain;Muscle weakness (generalized) (M62.81) Pain - part of body:  (stomach?)    Time: 7711-6579 PT Time Calculation (min) (ACUTE ONLY): 31 min   Charges:   PT Evaluation $PT Eval Moderate Complexity: 1 Mod          Marisa Severin, PT, DPT Acute Rehabilitation Services Secure chat preferred Office [place] 08/14/2022, 12:19 PM

## 2022-08-14 NOTE — Evaluation (Signed)
Occupational Therapy Evaluation and Discharge Patient Details Name: [name] MRN: [id number] DOB: [birth year] Today's Date: 08/14/2022   History of Present Illness Patient is a 81 y/o female who presents on 10/13 from home due to distended abdomen and vomiting, recently admitted 10/10-10/12 at [facility] wih ARF, sepsis with UTI and aspiration PNA- supposed to be d/ced with in home hospice. PMH includes advanced dementia, non verbal, CHF, CKD, HTN, COPD, depression, ovarian ca, seizures, chronic venous insufficiency, mitral valve prolapse and SDH.   Clinical Impression   Pt has been dependent in mobility, primarily bed bound, and ADLs since mid September. She is non verbal, generally weak and demonstrates reflexive grasp B with contact to palm of hands. Pt requires +2 total assist for bed mobility, but was able to sit unsupported at EOB.  Pt with multiple audible burps which appeared to decrease her abdominal discomfort while sitting up.  Pt's supportive daughter participated in session. Pt could benefit from a hoyer lift at home so she may get OOB to her transport chair or lift chair safely. No further OT needs.      Recommendations for follow up therapy are one component of a multi-disciplinary discharge planning process, led by the attending physician.  Recommendations may be updated based on patient status, additional functional criteria and insurance authorization.   Follow Up Recommendations  No OT follow up    Assistance Recommended at Discharge Frequent or constant Supervision/Assistance  Patient can return home with the following Two people to help with walking and/or transfers;Assist for transportation;Help with stairs or ramp for entrance;Two people to help with bathing/dressing/bathroom;Assistance with cooking/housework;Assistance with feeding;Direct supervision/assist for medications management;Direct supervision/assist for financial management    Functional Status Assessment   Patient has not had a recent decline in their functional status  Equipment Recommendations  Other (comment) (hoyer lift)    Recommendations for Other Services       Precautions / Restrictions Precautions Precautions: Fall Restrictions Weight Bearing Restrictions: No      Mobility Bed Mobility Overal bed mobility: Needs Assistance Bed Mobility: Supine to Sit, Sit to Supine     Supine to sit: Total assist, +2 for physical assistance Sit to supine: +2 for physical assistance, Total assist        Transfers                          Balance Overall balance assessment: Needs assistance Sitting-balance support: Feet supported, Bilateral upper extremity supported Sitting balance-Leahy Scale: Fair Sitting balance - Comments: min to min guard assist, fatigues                                   ADL either performed or assessed with clinical judgement   ADL Overall ADL's : At baseline                                             Vision Patient Visual Report: No change from baseline       Perception     Praxis      Pertinent Vitals/Pain Pain Assessment Faces Pain Scale: Hurts little more     Hand Dominance Left   Extremity/Trunk Assessment Upper Extremity Assessment Upper Extremity Assessment: Defer to OT evaluation RUE Deficits / Details: reflexive grasp LUE  Deficits / Details: reflexive grasp   Lower Extremity Assessment Lower Extremity Assessment: Generalized weakness;Difficult to assess due to impaired cognition (Limited AROM noted throughout LEs esp to command. Able to wiggle toes, perform PROM of knees/hips)   Cervical / Trunk Assessment Cervical / Trunk Assessment: Other exceptions Cervical / Trunk Exceptions: obesity, weakness   Communication Communication Communication:  (nonverbal for the last week per daughter)   Cognition Arousal/Alertness: Awake/alert Behavior During Therapy: Flat affect Overall  Cognitive Status: History of cognitive impairments - at baseline                                 General Comments: Pt does not follow commands, non verbal.     General Comments       Exercises     Shoulder Instructions      Home Living Family/patient expects to be discharged to:: Private residence Living Arrangements: Children (daughter) Available Help at Discharge: Family;Available 24 hours/day Type of Home: House Home Access: Level entry     Home Layout: One level     Bathroom Shower/Tub: Tub/shower unit (sponge bathes)   Bathroom Toilet: Standard     Home Equipment: Conservation officer, nature (2 wheels);BSC/3in1;Hospital bed;Wheelchair - manual;Tub bench   Additional Comments: lift chair      Prior Functioning/Environment Prior Level of Function : Needs assist             Mobility Comments: pt has not been ambulatory since Sept 11th, mainly bedbound and sometimes transferring with assist from daughter/aide but lots of falls. ADLs Comments: assist with all care including feeding recently. has an aide come 3x/week to assist with dressing/bathing/transfers.        OT Problem List:        OT Treatment/Interventions:      OT Goals(Current goals can be found in the care plan section) Acute Rehab OT Goals OT Goal Formulation: With family  OT Frequency:      Co-evaluation PT/OT/SLP Co-Evaluation/Treatment: Yes Reason for Co-Treatment: Complexity of the patient's impairments (multi-system involvement);Necessary to address cognition/behavior during functional activity   OT goals addressed during session: ADL's and self-care      AM-PAC OT "6 Clicks" Daily Activity     Outcome Measure Help from another person eating meals?: Total Help from another person taking care of personal grooming?: Total Help from another person toileting, which includes using toliet, bedpan, or urinal?: Total Help from another person bathing (including washing, rinsing, drying)?:  Total Help from another person to put on and taking off regular upper body clothing?: Total Help from another person to put on and taking off regular lower body clothing?: Total 6 Click Score: 6   End of Session    Activity Tolerance: Patient tolerated treatment well Patient left: in bed;with call bell/phone within reach;with family/visitor present  OT Visit Diagnosis: Muscle weakness (generalized) (M62.81);Pain;Other symptoms and signs involving cognitive function                Time: 0981-1914 OT Time Calculation (min): 31 min Charges:  OT General Charges $OT Visit: 1 Visit OT Evaluation $OT Eval Moderate Complexity: [place], OTR/L Acute Rehabilitation Services Office: [phone]   Malka So 08/14/2022, 12:16 PM

## 2022-08-14 NOTE — Evaluation (Signed)
Clinical/Bedside Swallow Evaluation Patient Details  Name: [name] MRN: [id number] Date of Birth: Jul 05, 1941  Today's Date: 08/14/2022 Time: SLP Start Time (ACUTE ONLY): 1194 SLP Stop Time (ACUTE ONLY): 0849 SLP Time Calculation (min) (ACUTE ONLY): 16 min  Past Medical History:  Past Medical History:  Diagnosis Date   CHF (congestive heart failure) (HCC)    Chronic venous insufficiency    Colon, diverticulosis 06/2010   outpatient colonoscopy by Dr. Cristina Gong.  Need record   DDD (degenerative disc disease), lumbosacral    Depression    DJD (degenerative joint disease), multiple sites    Low back pain worst   GERD (gastroesophageal reflux disease)    Gout    Gout    Uric Acid level 4.2 on 300 allopurinol   History of cervical cancer 1972   Hx-TIA (transient ischemic attack) 05/13/2001   Right facial numbness   Hx-TIA (transient ischemic attack) 05/13/2001   Looking back in Epic, the patient had a hospitalization in July 2002 for evaluation of questionable TIA (s/s of right facial numbness associated with mild blurred vision in her right eye, diaphoresis, and mild confusion) with negative CT head and negative MRI/MRA and was started on Plavix after being heparinized. She was to have possible follow up with Neurology for reevaluation of the need for Pl   Hyperlipidemia    Hypertension    Keloid 08/01/2018   Memory disorder 12/20/2016   Mitral valve prolapse 10/27/1999   Subsequent 2D echo show normal mitral valve   Osteoarthritis of hip    bilateral hips   Ovarian cancer ([place]) 1972   S/P oophorectomy   Psychosis ([place])    Recurrent boils    History of MRSA skin infections with abscess   Seizures ([place])    Sensorineural hearing loss of both ears    Subdural hematoma ([place]) 01/26/2017   bilateral   Uncomplicated opioid dependence ([place]) 01/31/2018   Past Surgical History:  Past Surgical History:  Procedure Laterality Date   ABDOMINAL HYSTERECTOMY     1972   BURR  HOLE Bilateral 02/12/2017   Procedure: Haskell Flirt;  Surgeon: Earnie Larsson, MD;  Location: [facility];  Service: Neurosurgery;  Laterality: Bilateral;   CERVICAL DISCECTOMY  7/07   C5-C6   JOINT REPLACEMENT     bilateral hip replacement   LUMBAR DISC SURGERY     L5-S1 7/07   LUMBAR LAMINECTOMY/DECOMPRESSION MICRODISCECTOMY N/A 02/02/2016   Procedure: L4-5 Decompression;  Surgeon: Marybelle Killings, MD;  Location: [facility];  Service: Orthopedics;  Laterality: N/A;   OOPHORECTOMY     1972 for ovarian cancer   HPI:  Pt is a 81 y.o. female with medical history significant of hypertension, dyslipidemia, diastolic CHF, diverticulosis, GERD, gout, TIA, seizure disorder, and severe dementia with behavioral disturbance who presents with complaints of vomiting. Recent hospitalization 10/9-10/12 with acute renal failure and sepsis thought secondary to UTI and aspiration pneumonia. MRI head showed no acute findings.    Assessment / Plan / Recommendation  Clinical Impression    Pt seen for clinical swallow evaluation. Caregiver reports the pt's diet at home includes thin liquids and puree consistency as pt spits out regular consistency foods after mastication. Pt presents with a baseline wet vocal quality. She was cognitively unable to follow commands to participate in oral motor assessment, however, no deviations in facial symmetry noted. Pt is edentulous. Trials of thin liquids and puree consistency given. Prolonged oral transit and oral holding noted with puree consistency, however, pt cleared entire  bolus from oral cavity with the swallow. One instance of delayed cough after thin liquids was present. Recommend puree consistency/thin liquids. Caregiver educated on aspiration precautions and diet recommendations. No ST treatment required at this time as suspect pt is at or near baseline functioning.   SLP Visit Diagnosis: Dysphagia, unspecified (R13.10)    Aspiration Risk  Risk for inadequate nutrition/hydration;Moderate  aspiration risk    Diet Recommendation Thin liquid;Dysphagia 1 (Puree)   Liquid Administration via: Straw;Cup Medication Administration: Crushed with puree Supervision: Full supervision/cueing for compensatory strategies Compensations: Minimize environmental distractions;Slow rate;Small sips/bites;Follow solids with liquid Postural Changes: Seated upright at 90 degrees    Other  Recommendations Oral Care Recommendations: Oral care BID    Recommendations for follow up therapy are one component of a multi-disciplinary discharge planning process, led by the attending physician.  Recommendations may be updated based on patient status, additional functional criteria and insurance authorization.  Follow up Recommendations No SLP follow up      Assistance Recommended at Discharge Frequent or constant Supervision/Assistance  Functional Status Assessment Patient has had a recent decline in their functional status and/or demonstrates limited ability to make significant improvements in function in a reasonable and predictable amount of time  Frequency and Duration            Prognosis        Swallow Study   General Date of Onset: 08/11/22 HPI: Pt is a 81 y.o. female with medical history significant of hypertension, dyslipidemia, diastolic CHF, diverticulosis, GERD, gout, TIA, seizure disorder, and severe dementia with behavioral disturbance who presents with complaints of vomiting. Recent hospitalization 10/9-10/12 with acute renal failure and sepsis thought secondary to UTI and aspiration pneumonia. MRI head showed no acute findings. Type of Study: Bedside Swallow Evaluation Previous Swallow Assessment: Bedside Swallow Evaluation 08/08/2022 (thin/puree) Diet Prior to this Study: NPO Temperature Spikes Noted: No Respiratory Status: Room air History of Recent Intubation: No Behavior/Cognition: Lethargic/Drowsy;Doesn't follow directions;Requires cueing Oral Cavity Assessment: Within  Functional Limits Oral Care Completed by SLP: No Oral Cavity - Dentition: Edentulous Vision: Functional for self-feeding Self-Feeding Abilities: Total assist Patient Positioning: Upright in bed Baseline Vocal Quality: Wet Volitional Cough: Cognitively unable to elicit Volitional Swallow: Unable to elicit    Oral/Motor/Sensory Function Overall Oral Motor/Sensory Function: Other (comment) (Cognitivley unable to follow commands)   Ice Chips Ice chips: Not tested   Thin Liquid Thin Liquid: Impaired Presentation: Straw Pharyngeal  Phase Impairments: Cough - Delayed    Nectar Thick Nectar Thick Liquid: Not tested   Honey Thick Honey Thick Liquid: Not tested   Puree Puree: Impaired Presentation: Spoon Oral Phase Functional Implications: Oral holding;Prolonged oral transit   Solid     Solid: Not tested      Susann Givens Student SLP  08/14/2022,9:46 AM

## 2022-08-14 NOTE — Progress Notes (Signed)
PROGRESS NOTE  RUT BETTERTON LGX:[id number] DOB: 1941-03-13 DOA: 08/11/2022 PCP: Vivi Barrack, MD  HPI/Recap of past 24 hours: [name] is a 81 y.o. female with medical history significant of hypertension, dyslipidemia, diastolic CHF, diverticulosis, GERD, gout, TIA, seizure disorder, and severe dementia with behavioral disturbance who presents with complaints of vomiting.  History is obtained from patient's daughter at bedside. Hospitalized 10/9-10/12 with acute renal failure and sepsis thought secondary to UTI and aspiration pneumonia. Palliative care was consulted to help establish goals of care and ultimately patient was discharged home with hospice. After getting home patient had developed abdominal pain and vomiting. Patient appeared to be in significant pain. In the ED, pt was febrile up to 100.4 F with tachycardia and tachypnea.  Labs significant for WBC 31.6, alkaline phosphatase 241, albumin 2.4, lipase 153, AST 71, ALT 122, and total bilirubin 2.2.  Chest x-ray noted mild airspace disease in the right lung base slightly improved from prior exam.  Urinalysis suspicious for UTI, although noted squamous cells. Blood and urine cultures have been obtained.  Patient was admitted for further management.    Today, met patient's daughter at bedside.  Discussed extensively with daughter.     Assessment/Plan: Principal Problem:   Sepsis secondary to UTI [facility]) Active Problems:   Aspiration pneumonia (HCC)   Nausea and vomiting   Abdominal pain   AKI (acute kidney injury) ([place])   Hydroureteronephrosis   Dementia with behavioral disturbance (HCC)   Elevated liver enzymes   Seizure disorder ([place])   GERD   ??Sepsis due to possible UTI Vs Aspiration PNA On presentation, fever up to 100.4 F with tachycardia, tachypnea, elevated WBC Chest x-ray noted improvement of right lung base opacity UA concerning for infection, but also possibly contaminated as there was a elevated  number of squamous epithelial cells UC growing yeast (no need to treat, likely contaminated) BC X 2 NGTD Procalcitonin elevated 1.42-->1.08 Continue empiric antibiotics of Rocephin and azithromycin Daily CBC  Likely vaginal yeast infection Noted some whitish vaginal discharge Gave a dose of fluconazole on 08/14/2022   Nausea and Vomiting  abdominal pain Improving CT imaging noted dilated small loops of bowel with narrowing present Case was discussed with Dr. Randel Pigg who thought symptoms may be related to the bladder infection with hydronephrosis Advance to pured diet Pain management   Acute kidney injury hydroureteronephrosis Hypernatremia Cr during last hospitalization had been elevated up to 7.8, but currently trending downwards CT noted improvement, but persistent hydronephrosis and hydroureter bilaterally Replace Foley catheter Status IVF D5W at 100 cc/h Plan for urology consultation  Elevated liver enzymes Suspect likely related to findings seen on the CT scan of the abdomen Vs sepsis Trend LFTs  Chronic diastolic HF Difficult to determine volume status BNP 58 CXR without any pulmonary edema Last echo done was in 2021, which showed EF of 65 to 70%, no regional wall motion abnormality, grade 1 diastolic dysfunction Monitor  GERD Protonix IV   Dementia with behavior disturbance Namenda and Aricept once able Delirium precautions  History of schizophrenia/seizure disorder Continue Lamictal  Obesity          Estimated body mass index is 36.21 kg/m as calculated from the following:   Height as of this encounter: _0  (1.626 m).   Weight as of this encounter: 95.7 kg.     Code Status: DNR  Family Communication: Discussed extensively with daughter at bedside  Disposition Plan: Status is: Inpatient Remains inpatient appropriate because: Level of  care      Consultants: None  Procedures: None  Antimicrobials: Ceftriaxone Azithromycin  DVT  prophylaxis: Heparin [place]   Objective: Vitals:   08/13/22 1838 08/13/22 2055 08/14/22 0437 08/14/22 1237  BP: (!) 102/57 (!) 147/54 (!) 143/77 132/68  Pulse: 90 95 99 98  Resp: 20 20 (!) 22 19  Temp:  98.2 F (36.8 C)  97.9 F (36.6 C)  TempSrc:  Axillary  Oral  SpO2: 93% 94% 100%   Weight:      Height:        Intake/Output Summary (Last 24 hours) at 08/14/2022 1512 Last data filed at 08/14/2022 0807 Gross per 24 hour  Intake 3 ml  Output --  Net 3 ml   Filed Weights   08/11/22 2352  Weight: 95.7 kg    Exam: General: NAD, not oriented Cardiovascular: S1, S2 present Respiratory: Diminished breath sounds Abdomen: Soft, nontender, nondistended, bowel sounds present Musculoskeletal: No bilateral pedal edema noted Skin: Normal Psychiatry: Unable to assess    Data Reviewed: CBC: Recent Labs  Lab 08/07/22 2109 08/09/22 0855 08/12/22 0100 08/13/22 0855 08/14/22 0301  WBC 33.7* 17.1* 31.6* 29.7* 25.5*  NEUTROABS 29.4*  --  27.8* 23.9* 20.0*  HGB 11.5* 9.3* 11.2* 11.2* 10.3*  HCT 37.9 29.7* 35.8* 34.6* 31.2*  MCV 101.1* 97.1 100.3* 97.5 95.7  PLT 301 301 349 331 725   Basic Metabolic Panel: Recent Labs  Lab 08/08/22 1110 08/08/22 2029 08/09/22 0234 08/09/22 0855 08/09/22 1424 08/09/22 1812 08/10/22 0629 08/12/22 0100 08/13/22 0855 08/14/22 0301  NA 143 145  --  149*  --   --   --  143 146* 151*  K 5.9* 5.0  5.0   < > 4.8   < > 4.5 3.9 4.9 4.8 5.0  CL 112* 117*  --  119*  --   --   --  112* 117* 119*  CO2 16* 18*  --  18*  --   --   --  17* 15* 20*  GLUCOSE 95 107*  --  99  --   --   --  80 74 94  BUN 114* 111*  --  102*  --   --   --  56* 48* 45*  CREATININE 7.83* 6.33*  --  5.26*  --   --  3.29* 2.93* 2.31* 1.92*  CALCIUM 8.9 8.8*  --  8.8*  --   --   --  9.3 9.0 9.4  PHOS 5.7*  --   --  5.2*  --   --   --   --   --   --    < > = values in this interval not displayed.   GFR: Estimated Creatinine Clearance: 26.2 mL/min (A) (by C-G formula based  on SCr of 1.92 mg/dL (H)). Liver Function Tests: Recent Labs  Lab 08/08/22 0515 08/08/22 1110 08/09/22 0855 08/12/22 0100 08/13/22 0855 08/14/22 0301  AST 121*  --   --  71* 56* 41  ALT 149*  --   --  122* 92* 79*  ALKPHOS 213*  --   --  241* 230* 215*  BILITOT 1.7*  --   --  2.2* 1.9* 1.4*  PROT 6.6  --   --  7.3 6.1* 6.8  ALBUMIN 2.5* 2.8* 2.2* 2.4* 2.3* 2.0*   Recent Labs  Lab 08/12/22 0100 08/13/22 0855 08/14/22 0301  LIPASE 153* 66* 48   No results for input(s): "AMMONIA" in the last 168 hours. Coagulation Profile:  No results for input(s): "INR", "PROTIME" in the last 168 hours. Cardiac Enzymes: No results for input(s): "CKTOTAL", "CKMB", "CKMBINDEX", "TROPONINI" in the last 168 hours. BNP (last 3 results) No results for input(s): "PROBNP" in the last 8760 hours. HbA1C: No results for input(s): "HGBA1C" in the last 72 hours. CBG: Recent Labs  Lab 08/08/22 1606 08/08/22 1706 08/09/22 0854  GLUCAP 99 159* 99   Lipid Profile: No results for input(s): "CHOL", "HDL", "LDLCALC", "TRIG", "CHOLHDL", "LDLDIRECT" in the last 72 hours. Thyroid Function Tests: No results for input(s): "TSH", "T4TOTAL", "FREET4", "T3FREE", "THYROIDAB" in the last 72 hours. Anemia Panel: No results for input(s): "VITAMINB12", "FOLATE", "FERRITIN", "TIBC", "IRON", "RETICCTPCT" in the last 72 hours. Urine analysis:    Component Value Date/Time   COLORURINE AMBER (A) 08/12/2022 0220   APPEARANCEUR TURBID (A) 08/12/2022 0220   APPEARANCEUR Clear 05/07/2017 1614   LABSPEC 1.017 08/12/2022 0220   PHURINE 5.0 08/12/2022 0220   GLUCOSEU NEGATIVE 08/12/2022 0220   GLUCOSEU NEGATIVE 07/31/2017 1511   HGBUR MODERATE (A) 08/12/2022 0220   HGBUR trace-intact 11/28/2007 1531   BILIRUBINUR NEGATIVE 08/12/2022 0220   BILIRUBINUR Negative 09/01/2019 1532   BILIRUBINUR Negative 05/07/2017 1614   KETONESUR NEGATIVE 08/12/2022 0220   PROTEINUR 30 (A) 08/12/2022 0220   UROBILINOGEN 0.2 09/01/2019  1532   UROBILINOGEN 0.2 11/04/2017 1904   NITRITE NEGATIVE 08/12/2022 0220   LEUKOCYTESUR MODERATE (A) 08/12/2022 0220   Sepsis Labs: _0 (procalcitonin:4,lacticidven:4)  ) Recent Results (from the past 240 hour(s))  SARS Coronavirus 2 by RT PCR (hospital order, performed in [facility] hospital lab) *cepheid single result test*     Status: None   Collection Time: 08/08/22 12:11 AM   Specimen: Nasal Swab  Result Value Ref Range Status   SARS Coronavirus 2 by RT PCR NEGATIVE NEGATIVE Final    Comment: (NOTE) SARS-CoV-2 target nucleic acids are NOT DETECTED.  The SARS-CoV-2 RNA is generally detectable in upper and lower respiratory specimens during the acute phase of infection. The lowest concentration of SARS-CoV-2 viral copies this assay can detect is 250 copies / mL. A negative result does not preclude SARS-CoV-2 infection and should not be used as the sole basis for treatment or other patient management decisions.  A negative result may occur with improper specimen collection / handling, submission of specimen other than nasopharyngeal swab, presence of viral mutation(s) within the areas targeted by this assay, and inadequate number of viral copies (<250 copies / mL). A negative result must be combined with clinical observations, patient history, and epidemiological information.  Fact Sheet for Patients:   https://www.patel.info/  Fact Sheet for Healthcare Providers: https://hall.com/  This test is not yet approved or  cleared by the [place] FDA and has been authorized for detection and/or diagnosis of SARS-CoV-2 by FDA under an Emergency Use Authorization (EUA).  This EUA will remain in effect (meaning this test can be used) for the duration of the COVID-19 declaration under Section 564(b)(1) of the Act, 21 U.S.C. section 360bbb-3(b)(1), unless the authorization is terminated or revoked sooner.  Performed at [facility], [address] [place], [place] [address]   Blood culture (routine x 2)     Status: None   Collection Time: 08/08/22 12:11 AM   Specimen: BLOOD  Result Value Ref Range Status   Specimen Description BLOOD BLOOD RIGHT ARM  Final   Special Requests   Final    BOTTLES DRAWN AEROBIC AND ANAEROBIC Blood Culture results may not be optimal due to an  inadequate volume of blood received in culture bottles   Culture   Final    NO GROWTH 5 DAYS Performed at [facility], [address] [place], [place] [address]    Report Status 08/13/2022 FINAL  Final  Blood culture (routine x 2)     Status: None   Collection Time: 08/08/22 12:11 AM   Specimen: BLOOD  Result Value Ref Range Status   Specimen Description BLOOD BLOOD LEFT ARM  Final   Special Requests   Final    BOTTLES DRAWN AEROBIC AND ANAEROBIC Blood Culture results may not be optimal due to an inadequate volume of blood received in culture bottles   Culture   Final    NO GROWTH 5 DAYS Performed at [facility], [address], [place], [place] [address]    Report Status 08/13/2022 FINAL  Final  Urine Culture     Status: Abnormal   Collection Time: 08/08/22 12:11 AM   Specimen: Urine, Clean Catch  Result Value Ref Range Status   Specimen Description   Final    URINE, CLEAN CATCH Performed at [facility], [address] [place], [place] [address]    Special Requests   Final    NONE Performed at [facility], [address] [place], [place] [address]    Culture MULTIPLE SPECIES PRESENT, SUGGEST RECOLLECTION (A)  Final   Report Status 08/09/2022 FINAL  Final  Blood culture (routine x 2)     Status: None (Preliminary result)   Collection Time: 08/12/22  1:00 AM   Specimen: BLOOD LEFT ARM  Result Value Ref Range Status   Specimen Description BLOOD LEFT ARM  Final   Special Requests   Final    BOTTLES DRAWN AEROBIC AND ANAEROBIC Blood Culture adequate volume   Culture   Final     NO GROWTH 2 DAYS Performed at [facility], [place] [address] [place], [place] [address]    Report Status PENDING  Incomplete  Urine Culture     Status: Abnormal   Collection Time: 08/12/22  2:20 AM   Specimen: Urine, Clean Catch  Result Value Ref Range Status   Specimen Description URINE, CLEAN CATCH  Final   Special Requests   Final    NONE Performed at [facility], [place] [address] [place], [place] [address]    Culture 60,000 COLONIES/mL YEAST (A)  Final   Report Status 08/13/2022 FINAL  Final  Blood culture (routine x 2)     Status: None (Preliminary result)   Collection Time: 08/13/22  8:55 AM   Specimen: BLOOD  Result Value Ref Range Status   Specimen Description BLOOD SITE NOT SPECIFIED  Final   Special Requests   Final    BOTTLES DRAWN AEROBIC AND ANAEROBIC Blood Culture results may not be optimal due to an inadequate volume of blood received in culture bottles   Culture   Final    NO GROWTH < 24 HOURS Performed at [facility], [place] [address] [place], [place] [address]    Report Status PENDING  Incomplete  C Difficile Quick Screen w PCR reflex     Status: None   Collection Time: 08/13/22  3:34 PM   Specimen: STOOL  Result Value Ref Range Status   C Diff antigen NEGATIVE NEGATIVE Final   C Diff toxin NEGATIVE NEGATIVE Final   C Diff interpretation No C. difficile detected.  Final    Comment: Performed at [facility]  [facility], [address], [place], [place] [address]  Gastrointestinal Panel by PCR , Stool     Status: None   Collection Time: 08/13/22  3:34 PM   Specimen: STOOL  Result Value Ref Range Status   Campylobacter species NOT DETECTED NOT DETECTED Final   Plesimonas shigelloides NOT DETECTED NOT DETECTED Final   Salmonella species NOT DETECTED NOT DETECTED Final   Yersinia enterocolitica NOT DETECTED NOT DETECTED Final   Vibrio species NOT DETECTED NOT DETECTED Final   Vibrio cholerae NOT DETECTED NOT DETECTED Final   Enteroaggregative  E coli (EAEC) NOT DETECTED NOT DETECTED Final   Enteropathogenic E coli (EPEC) NOT DETECTED NOT DETECTED Final   Enterotoxigenic E coli (ETEC) NOT DETECTED NOT DETECTED Final   Shiga like toxin producing E coli (STEC) NOT DETECTED NOT DETECTED Final   Shigella/Enteroinvasive E coli (EIEC) NOT DETECTED NOT DETECTED Final   Cryptosporidium NOT DETECTED NOT DETECTED Final   Cyclospora cayetanensis NOT DETECTED NOT DETECTED Final   Entamoeba histolytica NOT DETECTED NOT DETECTED Final   Giardia lamblia NOT DETECTED NOT DETECTED Final   Adenovirus F40/41 NOT DETECTED NOT DETECTED Final   Astrovirus NOT DETECTED NOT DETECTED Final   Norovirus GI/GII NOT DETECTED NOT DETECTED Final   Rotavirus A NOT DETECTED NOT DETECTED Final   Sapovirus (I, II, IV, and V) NOT DETECTED NOT DETECTED Final    Comment: Performed at [facility], [address], [place], [place] [address]      Studies: No results found.  Scheduled Meds:  carvedilol  3.125 mg Oral BID WC   Chlorhexidine Gluconate Cloth  6 each Topical Daily   fluconazole  150 mg Oral Once   heparin  5,000 Units Subcutaneous Q8H   lamoTRIgine  50 mg Oral BID   pantoprazole (PROTONIX) IV  40 mg Intravenous Q24H   sodium chloride flush  3 mL Intravenous Q12H    Continuous Infusions:  azithromycin 500 mg (08/14/22 0933)   cefTRIAXone (ROCEPHIN)  IV 2 g (08/14/22 0812)   dextrose 100 mL/hr at 08/14/22 0809     LOS: 2 days     Alma Friendly, MD [facility]  If 7PM-7AM, please contact night-coverage [email or web address] 08/14/2022, 3:12 PM

## 2022-08-15 DIAGNOSIS — F03918 Unspecified dementia, unspecified severity, with other behavioral disturbance: Secondary | ICD-10-CM | POA: Diagnosis not present

## 2022-08-15 DIAGNOSIS — A419 Sepsis, unspecified organism: Secondary | ICD-10-CM | POA: Diagnosis not present

## 2022-08-15 DIAGNOSIS — N179 Acute kidney failure, unspecified: Secondary | ICD-10-CM | POA: Diagnosis not present

## 2022-08-15 DIAGNOSIS — J690 Pneumonitis due to inhalation of food and vomit: Secondary | ICD-10-CM | POA: Diagnosis not present

## 2022-08-15 LAB — CBC WITH DIFFERENTIAL/PLATELET
Abs Immature Granulocytes: 0.83 10*3/uL — ABNORMAL HIGH (ref 0.00–0.07)
Basophils Absolute: 0.1 10*3/uL (ref 0.0–0.1)
Basophils Relative: 0 %
Eosinophils Absolute: 0.5 10*3/uL (ref 0.0–0.5)
Eosinophils Relative: 3 %
HCT: 28.4 % — ABNORMAL LOW (ref 36.0–46.0)
Hemoglobin: 9.3 g/dL — ABNORMAL LOW (ref 12.0–15.0)
Immature Granulocytes: 4 %
Lymphocytes Relative: 13 %
Lymphs Abs: 2.5 10*3/uL (ref 0.7–4.0)
MCH: 31.5 pg (ref 26.0–34.0)
MCHC: 32.7 g/dL (ref 30.0–36.0)
MCV: 96.3 fL (ref 80.0–100.0)
Monocytes Absolute: 1.1 10*3/uL — ABNORMAL HIGH (ref 0.1–1.0)
Monocytes Relative: 6 %
Neutro Abs: 13.7 10*3/uL — ABNORMAL HIGH (ref 1.7–7.7)
Neutrophils Relative %: 74 %
Platelets: 285 10*3/uL (ref 150–400)
RBC: 2.95 MIL/uL — ABNORMAL LOW (ref 3.87–5.11)
RDW: 17.1 % — ABNORMAL HIGH (ref 11.5–15.5)
WBC: 18.7 10*3/uL — ABNORMAL HIGH (ref 4.0–10.5)
nRBC: 0.1 % (ref 0.0–0.2)

## 2022-08-15 LAB — COMPREHENSIVE METABOLIC PANEL
ALT: 120 U/L — ABNORMAL HIGH (ref 0–44)
AST: 108 U/L — ABNORMAL HIGH (ref 15–41)
Albumin: 1.9 g/dL — ABNORMAL LOW (ref 3.5–5.0)
Alkaline Phosphatase: 171 U/L — ABNORMAL HIGH (ref 38–126)
Anion gap: 6 (ref 5–15)
BUN: 37 mg/dL — ABNORMAL HIGH (ref 8–23)
CO2: 20 mmol/L — ABNORMAL LOW (ref 22–32)
Calcium: 8.9 mg/dL (ref 8.9–10.3)
Chloride: 118 mmol/L — ABNORMAL HIGH (ref 98–111)
Creatinine, Ser: 1.77 mg/dL — ABNORMAL HIGH (ref 0.44–1.00)
GFR, Estimated: 29 mL/min — ABNORMAL LOW (ref >60)
Glucose, Bld: 165 mg/dL — ABNORMAL HIGH (ref 70–99)
Potassium: 4.2 mmol/L (ref 3.5–5.1)
Sodium: 144 mmol/L (ref 135–145)
Total Bilirubin: 1.1 mg/dL (ref 0.3–1.2)
Total Protein: 6.2 g/dL — ABNORMAL LOW (ref 6.5–8.1)

## 2022-08-15 LAB — LIPASE, BLOOD: Lipase: 44 U/L (ref 11–51)

## 2022-08-15 LAB — PROCALCITONIN: Procalcitonin: 0.78 ng/mL

## 2022-08-15 NOTE — Care Management Important Message (Signed)
Important Message  Patient Details  Name: [name] MRN: [id number] Date of Birth: 05-14-1941   Medicare Important Message Given:  Yes     Hannah Beat 08/15/2022, 12:10 PM

## 2022-08-15 NOTE — Progress Notes (Signed)
PROGRESS NOTE  [name] VVO:[id number] DOB: 1941/09/12 DOA: 08/11/2022 PCP: Vivi Barrack, MD  HPI/Recap of past 24 hours: [name] is a 81 y.o. female with medical history significant of hypertension, dyslipidemia, diastolic CHF, diverticulosis, GERD, gout, TIA, seizure disorder, and severe dementia with behavioral disturbance who presents with complaints of vomiting. History is obtained from patient's daughter at bedside. Hospitalized 10/9-10/12 with acute renal failure and sepsis thought secondary to UTI and aspiration pneumonia. Palliative care was consulted to help establish goals of care and ultimately patient was discharged home with hospice. After getting home patient had developed abdominal pain and vomiting. Patient appeared to be in significant pain. In the ED, pt was febrile up to 100.4 F with tachycardia and tachypnea.  Labs significant for WBC 31.6, alkaline phosphatase 241, albumin 2.4, lipase 153, AST 71, ALT 122, and total bilirubin 2.2.  Chest x-ray noted mild airspace disease in the right lung base slightly improved from prior exam.  Urinalysis suspicious for UTI, although noted squamous cells. Blood and urine cultures have been obtained.  Patient was admitted for further management.     Today, discussed extensively with patient's daughter at bedside about plan of care.  Abdominal pain upon palpation with abdominal distention noted     Assessment/Plan: Principal Problem:   Sepsis secondary to UTI [facility]) Active Problems:   Aspiration pneumonia (HCC)   Nausea and vomiting   Abdominal pain   AKI (acute kidney injury) ([place])   Hydroureteronephrosis   Dementia with behavioral disturbance (HCC)   Elevated liver enzymes   Seizure disorder ([place])   GERD   ??Sepsis due to possible UTI Vs Aspiration PNA On presentation, fever up to 100.4 F with tachycardia, tachypnea, elevated WBC Chest x-ray noted improvement of right lung base opacity UA concerning for  infection, but also possibly contaminated as there was a elevated number of squamous epithelial cells UC growing yeast (no need to treat, likely contaminated) BC X 2 NGTD Procalcitonin elevated 1.42-->1.08 Continue empiric antibiotics of Rocephin and azithromycin Daily CBC  Likely vaginal yeast infection Noted some whitish vaginal discharge Gave a dose of fluconazole on 08/14/2022   Nausea and Vomiting  abdominal pain with distention Noted possible ileus CT imaging noted dilated small loops of bowel with narrowing present Discussed with general surgery on 08/15/2022, unlikely SBO, possibly ileus Consulted [facility] Dr. Alessandra Bevels, will see patient on 08/16/2022 Advance to pured diet as tolerated Pain management   Acute kidney injury hydroureteronephrosis Hypernatremia Cr during last hospitalization had been elevated up to 7.8, but currently trending downwards CT noted improvement, but persistent hydronephrosis and hydroureter bilaterally Replace Foley catheter S/p IVF D5W due to patient sounding wet/congested Discussed with urology Dr. Tresa Moore about CT abdomen/pelvis which showed hydronephrosis with possible bladder mass.  Recommended keeping Foley and to perform voiding trial as an outpatient.  Unlikely bladder mass.  Elevated liver enzymes Suspect likely related to findings seen on the CT scan of the abdomen Vs sepsis Trend LFTs  Chronic diastolic HF Difficult to determine volume status BNP 58 CXR without any pulmonary edema Last echo done was in 2021, which showed EF of 65 to 70%, no regional wall motion abnormality, grade 1 diastolic dysfunction Monitor  GERD Protonix IV   Dementia with behavior disturbance Namenda and Aricept once able Delirium precautions  History of schizophrenia/seizure disorder Continue Lamictal  Obesity          Estimated body mass index is 36.21 kg/m as calculated from the following:  Height as of this encounter: '5\' 4"'$  (1.626 m).    Weight as of this encounter: 95.7 kg.     Code Status: DNR  Family Communication: Discussed extensively with daughter at bedside  Disposition Plan: Status is: Inpatient Remains inpatient appropriate because: Level of care      Consultants: Discussed with general surgery [facility]  Procedures: None  Antimicrobials: Ceftriaxone Azithromycin  DVT prophylaxis: Heparin [place]   Objective: Vitals:   08/14/22 1237 08/14/22 2055 08/15/22 0837 08/15/22 1242  BP: 132/68  134/79 129/79  Pulse: 98 83 77 79  Resp: '19 19 15 16  '$ Temp: 97.9 F (36.6 C)  97.7 F (36.5 C) 98.4 F (36.9 C)  TempSrc: Oral  Oral Oral  SpO2:  96% 94% 100%  Weight:      Height:        Intake/Output Summary (Last 24 hours) at 08/15/2022 1730 Last data filed at 08/15/2022 1321 Gross per 24 hour  Intake 2235.4 ml  Output 500 ml  Net 1735.4 ml   Filed Weights   08/11/22 2352  Weight: 95.7 kg    Exam: General: NAD, not oriented Cardiovascular: S1, S2 present Respiratory: Coarse breath sounds Abdomen: Soft, tender, distended, bowel sounds present Musculoskeletal: No bilateral pedal edema noted Skin: Normal Psychiatry: Unable to assess    Data Reviewed: CBC: Recent Labs  Lab 08/09/22 0855 08/12/22 0100 08/13/22 0855 08/14/22 0301 08/15/22 0324  WBC 17.1* 31.6* 29.7* 25.5* 18.7*  NEUTROABS  --  27.8* 23.9* 20.0* 13.7*  HGB 9.3* 11.2* 11.2* 10.3* 9.3*  HCT 29.7* 35.8* 34.6* 31.2* 28.4*  MCV 97.1 100.3* 97.5 95.7 96.3  PLT 301 349 331 334 361   Basic Metabolic Panel: Recent Labs  Lab 08/09/22 0855 08/09/22 1424 08/10/22 0629 08/12/22 0100 08/13/22 0855 08/14/22 0301 08/15/22 0324  NA 149*  --   --  143 146* 151* 144  K 4.8   < > 3.9 4.9 4.8 5.0 4.2  CL 119*  --   --  112* 117* 119* 118*  CO2 18*  --   --  17* 15* 20* 20*  GLUCOSE 99  --   --  80 74 94 165*  BUN 102*  --   --  56* 48* 45* 37*  CREATININE 5.26*  --  3.29* 2.93* 2.31* 1.92* 1.77*  CALCIUM 8.8*  --   --  9.3  9.0 9.4 8.9  PHOS 5.2*  --   --   --   --   --   --    < > = values in this interval not displayed.   GFR: Estimated Creatinine Clearance: 28.5 mL/min (A) (by C-G formula based on SCr of 1.77 mg/dL (H)). Liver Function Tests: Recent Labs  Lab 08/09/22 0855 08/12/22 0100 08/13/22 0855 08/14/22 0301 08/15/22 0324  AST  --  71* 56* 41 108*  ALT  --  122* 92* 79* 120*  ALKPHOS  --  241* 230* 215* 171*  BILITOT  --  2.2* 1.9* 1.4* 1.1  PROT  --  7.3 6.1* 6.8 6.2*  ALBUMIN 2.2* 2.4* 2.3* 2.0* 1.9*   Recent Labs  Lab 08/12/22 0100 08/13/22 0855 08/14/22 0301 08/15/22 0324  LIPASE 153* 66* 48 44   No results for input(s): "AMMONIA" in the last 168 hours. Coagulation Profile: No results for input(s): "INR", "PROTIME" in the last 168 hours. Cardiac Enzymes: No results for input(s): "CKTOTAL", "CKMB", "CKMBINDEX", "TROPONINI" in the last 168 hours. BNP (last 3 results) No results for input(s): "  PROBNP" in the last 8760 hours. HbA1C: No results for input(s): "HGBA1C" in the last 72 hours. CBG: Recent Labs  Lab 08/09/22 0854  GLUCAP 99   Lipid Profile: No results for input(s): "CHOL", "HDL", "LDLCALC", "TRIG", "CHOLHDL", "LDLDIRECT" in the last 72 hours. Thyroid Function Tests: No results for input(s): "TSH", "T4TOTAL", "FREET4", "T3FREE", "THYROIDAB" in the last 72 hours. Anemia Panel: No results for input(s): "VITAMINB12", "FOLATE", "FERRITIN", "TIBC", "IRON", "RETICCTPCT" in the last 72 hours. Urine analysis:    Component Value Date/Time   COLORURINE AMBER (A) 08/12/2022 0220   APPEARANCEUR TURBID (A) 08/12/2022 0220   APPEARANCEUR Clear 05/07/2017 1614   LABSPEC 1.017 08/12/2022 0220   PHURINE 5.0 08/12/2022 0220   GLUCOSEU NEGATIVE 08/12/2022 0220   GLUCOSEU NEGATIVE 07/31/2017 1511   HGBUR MODERATE (A) 08/12/2022 0220   HGBUR trace-intact 11/28/2007 1531   BILIRUBINUR NEGATIVE 08/12/2022 0220   BILIRUBINUR Negative 09/01/2019 1532   BILIRUBINUR Negative  05/07/2017 1614   KETONESUR NEGATIVE 08/12/2022 0220   PROTEINUR 30 (A) 08/12/2022 0220   UROBILINOGEN 0.2 09/01/2019 1532   UROBILINOGEN 0.2 11/04/2017 1904   NITRITE NEGATIVE 08/12/2022 0220   LEUKOCYTESUR MODERATE (A) 08/12/2022 0220   Sepsis Labs: '@LABRCNTIP'$ (procalcitonin:4,lacticidven:4)  ) Recent Results (from the past 240 hour(s))  SARS Coronavirus 2 by RT PCR (hospital order, performed in [facility] hospital lab) *cepheid single result test*     Status: None   Collection Time: 08/08/22 12:11 AM   Specimen: Nasal Swab  Result Value Ref Range Status   SARS Coronavirus 2 by RT PCR NEGATIVE NEGATIVE Final    Comment: (NOTE) SARS-CoV-2 target nucleic acids are NOT DETECTED.  The SARS-CoV-2 RNA is generally detectable in upper and lower respiratory specimens during the acute phase of infection. The lowest concentration of SARS-CoV-2 viral copies this assay can detect is 250 copies / mL. A negative result does not preclude SARS-CoV-2 infection and should not be used as the sole basis for treatment or other patient management decisions.  A negative result may occur with improper specimen collection / handling, submission of specimen other than nasopharyngeal swab, presence of viral mutation(s) within the areas targeted by this assay, and inadequate number of viral copies (<250 copies / mL). A negative result must be combined with clinical observations, patient history, and epidemiological information.  Fact Sheet for Patients:   https://www.patel.info/  Fact Sheet for Healthcare Providers: https://hall.com/  This test is not yet approved or  cleared by the [place] FDA and has been authorized for detection and/or diagnosis of SARS-CoV-2 by FDA under an Emergency Use Authorization (EUA).  This EUA will remain in effect (meaning this test can be used) for the duration of the COVID-19 declaration under Section 564(b)(1) of the  Act, 21 U.S.C. section 360bbb-3(b)(1), unless the authorization is terminated or revoked sooner.  Performed at [facility], [address] [place], [place] [address]   Blood culture (routine x 2)     Status: None   Collection Time: 08/08/22 12:11 AM   Specimen: BLOOD  Result Value Ref Range Status   Specimen Description BLOOD BLOOD RIGHT ARM  Final   Special Requests   Final    BOTTLES DRAWN AEROBIC AND ANAEROBIC Blood Culture results may not be optimal due to an inadequate volume of blood received in culture bottles   Culture   Final    NO GROWTH 5 DAYS Performed at [facility], [address] [place], [place] [address]    Report Status 08/13/2022 FINAL  Final  Blood culture (routine x 2)     Status: None   Collection Time: 08/08/22 12:11 AM   Specimen: BLOOD  Result Value Ref Range Status   Specimen Description BLOOD BLOOD LEFT ARM  Final   Special Requests   Final    BOTTLES DRAWN AEROBIC AND ANAEROBIC Blood Culture results may not be optimal due to an inadequate volume of blood received in culture bottles   Culture   Final    NO GROWTH 5 DAYS Performed at [facility], [address], [place], [place] [address]    Report Status 08/13/2022 FINAL  Final  Urine Culture     Status: Abnormal   Collection Time: 08/08/22 12:11 AM   Specimen: Urine, Clean Catch  Result Value Ref Range Status   Specimen Description   Final    URINE, CLEAN CATCH Performed at [facility], [address] [place], [place] [address]    Special Requests   Final    NONE Performed at [facility], [address] [place], [place] [address]    Culture MULTIPLE SPECIES PRESENT, SUGGEST RECOLLECTION (A)  Final   Report Status 08/09/2022 FINAL  Final  Blood culture (routine x 2)     Status: None (Preliminary result)   Collection Time: 08/12/22  1:00 AM   Specimen: BLOOD LEFT ARM  Result Value Ref Range Status   Specimen Description BLOOD LEFT ARM   Final   Special Requests   Final    BOTTLES DRAWN AEROBIC AND ANAEROBIC Blood Culture adequate volume   Culture   Final    NO GROWTH 3 DAYS Performed at [facility], [place] [address], [place], [place] [address]    Report Status PENDING  Incomplete  Urine Culture     Status: Abnormal   Collection Time: 08/12/22  2:20 AM   Specimen: Urine, Clean Catch  Result Value Ref Range Status   Specimen Description URINE, CLEAN CATCH  Final   Special Requests   Final    NONE Performed at [facility], [place] [address] [place], [place] [address]    Culture 60,000 COLONIES/mL YEAST (A)  Final   Report Status 08/13/2022 FINAL  Final  Blood culture (routine x 2)     Status: None (Preliminary result)   Collection Time: 08/13/22  8:55 AM   Specimen: BLOOD  Result Value Ref Range Status   Specimen Description BLOOD SITE NOT SPECIFIED  Final   Special Requests   Final    BOTTLES DRAWN AEROBIC AND ANAEROBIC Blood Culture results may not be optimal due to an inadequate volume of blood received in culture bottles   Culture   Final    NO GROWTH 2 DAYS Performed at [facility], [place] [address], [place], [place] [address]    Report Status PENDING  Incomplete  C Difficile Quick Screen w PCR reflex     Status: None   Collection Time: 08/13/22  3:34 PM   Specimen: STOOL  Result Value Ref Range Status   C Diff antigen NEGATIVE NEGATIVE Final   C Diff toxin NEGATIVE NEGATIVE Final   C Diff interpretation No C. difficile detected.  Final    Comment: Performed at [facility], [place] [address], [place], [place] [address]  Gastrointestinal Panel by PCR , Stool     Status: None   Collection Time: 08/13/22  3:34 PM   Specimen: STOOL  Result Value Ref Range Status   Campylobacter  species NOT DETECTED NOT DETECTED Final   Plesimonas shigelloides NOT DETECTED NOT DETECTED Final   Salmonella species NOT DETECTED NOT DETECTED Final   Yersinia enterocolitica NOT DETECTED NOT DETECTED Final    Vibrio species NOT DETECTED NOT DETECTED Final   Vibrio cholerae NOT DETECTED NOT DETECTED Final   Enteroaggregative E coli (EAEC) NOT DETECTED NOT DETECTED Final   Enteropathogenic E coli (EPEC) NOT DETECTED NOT DETECTED Final   Enterotoxigenic E coli (ETEC) NOT DETECTED NOT DETECTED Final   Shiga like toxin producing E coli (STEC) NOT DETECTED NOT DETECTED Final   Shigella/Enteroinvasive E coli (EIEC) NOT DETECTED NOT DETECTED Final   Cryptosporidium NOT DETECTED NOT DETECTED Final   Cyclospora cayetanensis NOT DETECTED NOT DETECTED Final   Entamoeba histolytica NOT DETECTED NOT DETECTED Final   Giardia lamblia NOT DETECTED NOT DETECTED Final   Adenovirus F40/41 NOT DETECTED NOT DETECTED Final   Astrovirus NOT DETECTED NOT DETECTED Final   Norovirus GI/GII NOT DETECTED NOT DETECTED Final   Rotavirus A NOT DETECTED NOT DETECTED Final   Sapovirus (I, II, IV, and V) NOT DETECTED NOT DETECTED Final    Comment: Performed at [facility], [address] [place], [place] [address]      Studies: No results found.  Scheduled Meds:  carvedilol  3.125 mg Oral BID WC   Chlorhexidine Gluconate Cloth  6 each Topical Daily   heparin  5,000 Units Subcutaneous Q8H   lamoTRIgine  50 mg Oral BID   pantoprazole (PROTONIX) IV  40 mg Intravenous Q24H   sodium chloride flush  3 mL Intravenous Q12H    Continuous Infusions:  azithromycin 500 mg (08/15/22 0940)   cefTRIAXone (ROCEPHIN)  IV 2 g (08/15/22 1138)     LOS: 3 days     Alma Friendly, MD [facility]  If 7PM-7AM, please contact night-coverage [email or web address] 08/15/2022, 5:30 PM

## 2022-08-15 NOTE — Consult Note (Signed)
   [facility] Inpatient Consult   08/15/2022  [name] 1941-03-31 [id number]  [facility] [ACO] Patient: [name]  Chart reviewed and reveals the patient was recently home with Hospice/Palliative Care from [facility]. Chart review reveals hospice was never got started and patient brought to the hospital. Chart reviewed for ongoing community needs.  3:20 pm Came to room and no family currently at the bedside. Will continue to follow, patient with a long history with [facility] teams, in the past. Not showing as currently active with Care Coordination team.  Plan: For questions,   Natividad Brood, RN BSN [place]  [phone] business mobile phone Toll free office [phone]  *[place]  [phone] Fax number: [phone] [email or web address]

## 2022-08-15 NOTE — Progress Notes (Signed)
Pt lungs sounds coarse.  Spoke with daughter about possible aspiration precautions and that we shouldn't be forcing food or drinks while pt isnt responding.

## 2022-08-16 DIAGNOSIS — R6520 Severe sepsis without septic shock: Secondary | ICD-10-CM

## 2022-08-16 DIAGNOSIS — K567 Ileus, unspecified: Secondary | ICD-10-CM

## 2022-08-16 DIAGNOSIS — B999 Unspecified infectious disease: Secondary | ICD-10-CM

## 2022-08-16 DIAGNOSIS — A419 Sepsis, unspecified organism: Secondary | ICD-10-CM | POA: Diagnosis not present

## 2022-08-16 DIAGNOSIS — N390 Urinary tract infection, site not specified: Secondary | ICD-10-CM | POA: Diagnosis not present

## 2022-08-16 DIAGNOSIS — J189 Pneumonia, unspecified organism: Secondary | ICD-10-CM

## 2022-08-16 LAB — COMPREHENSIVE METABOLIC PANEL
ALT: 142 U/L — ABNORMAL HIGH (ref 0–44)
AST: 115 U/L — ABNORMAL HIGH (ref 15–41)
Albumin: 1.9 g/dL — ABNORMAL LOW (ref 3.5–5.0)
Alkaline Phosphatase: 224 U/L — ABNORMAL HIGH (ref 38–126)
Anion gap: 14 (ref 5–15)
BUN: 34 mg/dL — ABNORMAL HIGH (ref 8–23)
CO2: 16 mmol/L — ABNORMAL LOW (ref 22–32)
Calcium: 9.7 mg/dL (ref 8.9–10.3)
Chloride: 118 mmol/L — ABNORMAL HIGH (ref 98–111)
Creatinine, Ser: 1.51 mg/dL — ABNORMAL HIGH (ref 0.44–1.00)
GFR, Estimated: 35 mL/min — ABNORMAL LOW (ref >60)
Glucose, Bld: 93 mg/dL (ref 70–99)
Potassium: 4.7 mmol/L (ref 3.5–5.1)
Sodium: 148 mmol/L — ABNORMAL HIGH (ref 135–145)
Total Bilirubin: 1 mg/dL (ref 0.3–1.2)
Total Protein: 6.6 g/dL (ref 6.5–8.1)

## 2022-08-16 LAB — CBC WITH DIFFERENTIAL/PLATELET
Abs Immature Granulocytes: 0.69 10*3/uL — ABNORMAL HIGH (ref 0.00–0.07)
Basophils Absolute: 0.1 10*3/uL (ref 0.0–0.1)
Basophils Relative: 1 %
Eosinophils Absolute: 0.5 10*3/uL (ref 0.0–0.5)
Eosinophils Relative: 3 %
HCT: 32 % — ABNORMAL LOW (ref 36.0–46.0)
Hemoglobin: 10.5 g/dL — ABNORMAL LOW (ref 12.0–15.0)
Immature Granulocytes: 4 %
Lymphocytes Relative: 14 %
Lymphs Abs: 2.4 10*3/uL (ref 0.7–4.0)
MCH: 31.6 pg (ref 26.0–34.0)
MCHC: 32.8 g/dL (ref 30.0–36.0)
MCV: 96.4 fL (ref 80.0–100.0)
Monocytes Absolute: 1 10*3/uL (ref 0.1–1.0)
Monocytes Relative: 6 %
Neutro Abs: 12.7 10*3/uL — ABNORMAL HIGH (ref 1.7–7.7)
Neutrophils Relative %: 72 %
Platelets: 286 10*3/uL (ref 150–400)
RBC: 3.32 MIL/uL — ABNORMAL LOW (ref 3.87–5.11)
RDW: 17.4 % — ABNORMAL HIGH (ref 11.5–15.5)
WBC: 17.4 10*3/uL — ABNORMAL HIGH (ref 4.0–10.5)
nRBC: 0.1 % (ref 0.0–0.2)

## 2022-08-16 MED ORDER — METOCLOPRAMIDE HCL 5 MG/ML IJ SOLN
5.0000 mg | Freq: Three times a day (TID) | INTRAMUSCULAR | Status: AC
  Administered 2022-08-16 – 2022-08-17 (×3): 5 mg via INTRAVENOUS
  Filled 2022-08-16 (×3): qty 2

## 2022-08-16 MED ORDER — PANTOPRAZOLE SODIUM 40 MG PO TBEC: 40.0000 mg | DELAYED_RELEASE_TABLET | Freq: Every day | ORAL | Status: DC

## 2022-08-16 MED ORDER — DEXTROSE 5 % IV SOLN: INTRAVENOUS | Status: AC

## 2022-08-16 MED ORDER — SODIUM CHLORIDE 0.45 % IV SOLN: INTRAVENOUS | Status: DC

## 2022-08-16 NOTE — Progress Notes (Signed)
[facility] PROGRESS NOTE    Progress Note  [name]  NUU:[id number] DOB: 19-Dec-1940 DOA: 08/11/2022 PCP: Vivi Barrack, MD     Brief Narrative:   [name] is an 81 y.o. female past medical history of essential hypertension, chronic diastolic heart failure, TIA, seizure, advanced dementia with behavioral disorder services comes in complaining of vomiting recently discharged from the hospital on 08/10/2022 for acute kidney injury sepsis secondary to UTI and aspiration pneumonia.  Palliative care was consulted and she was discharged home with hospice.  After she arrived home she started developing abdominal pain and vomiting temperature of 100.4 tachycardic white count of 31 x-ray noted to have airspace disease at the right lung which is improved, urinalysis was suspicious for UTI blood cultures were obtained.     Assessment/Plan:   Sepsis secondary to aspiration pneumonia: She was febrile on admission tachycardic. She was started empirically on Rocephin and azithromycin. Blood cultures have been negative x2. We will complete a 8-day course of IV antibiotics in house. Remain afebrile leukocytosis is improving. Patient has an extremely poor prognosis she has ongoing aspiration.  Vaginal yeast infection Was given a dose of fluconazole on 08/14/2022.  Nausea and vomiting/abdominal pain likely due to Ileus: CT scan of the abdomen pelvis shows small dilated loops with narrowing present. General surgery was consulted they relate unlikely SBO possible ileus. GI was consulted recommended conservative management she is currently tolerating her pured diet.  Acute kidney injury on chronic kidney disease stage IIIb/hydroureteronephrosis: CT showed improving there hydronephrosis and hydroureter ureter bilaterally. A Foley was placed she was started on IV fluids. Creatinine on admission was 3 now 1.5 after fluid resuscitation.  Baseline runs around  1.3-1.5.  Hypernatremia: Probably not enough oral intake, started on D5W recheck basic metabolic panel tomorrow morning.  Elevated LFTs: Likely due to sepsis trending down.  Chronic diastolic heart failure: BNP of 58 chest x-ray shows no pulmonary edema. She has had significant decreased oral intake.  GERD: Continue PPI.  Dementia with behavioral disturbances: Continue Namenda and Aricept she is at high risk of aspiration.  History of schizophrenia/seizure disorder: Continue Lamictal.  Sacral decubitus ulcer stage II present on admission: RN Pressure Injury Documentation: Pressure Injury 08/08/22 Coccyx Medial Stage 2 -  Partial thickness loss of dermis presenting as a shallow open injury with a red, pink wound bed without slough. Pt with pink and red skin noted to coccyx, daughter reports it has been there for several we (Active)  08/08/22 1030  Location: Coccyx  Location Orientation: Medial  Staging: Stage 2 -  Partial thickness loss of dermis presenting as a shallow open injury with a red, pink wound bed without slough.  Wound Description (Comments): Pt with pink and red skin noted to coccyx, daughter reports it has been there for several weeks and daughter has been keeping it clean to prevent infection. No signs of infection noted at this time, Scant bleeding noted. Mepilex applied  Present on Admission: Yes  Dressing Type Foam - Lift dressing to assess site every shift 08/15/22 2117     Pressure Injury 08/13/22 Toe (Comment  which one) Anterior;Left Deep Tissue Pressure Injury - Purple or maroon localized area of discolored intact skin or blood-filled blister due to damage of underlying soft tissue from pressure and/or shear. (Active)  08/13/22 1942  Location: Toe (Comment  which one)  Location Orientation: Anterior;Left  Staging: Deep Tissue Pressure Injury - Purple or maroon localized area of discolored intact skin or  blood-filled blister due to damage of underlying soft  tissue from pressure and/or shear.  Wound Description (Comments):   Present on Admission: Yes  Dressing Type Other (Comment) 08/15/22 2117    Estimated body mass index is 36.21 kg/m as calculated from the following:   Height as of this encounter: '5\' 4"'$  (1.626 m).   Weight as of this encounter: 95.7 kg.  DVT prophylaxis: lovenox Family Communication:none Status is: Inpatient Remains inpatient appropriate because: Admitted for sepsis poor prognosis    Code Status:     Code Status Orders  (From admission, onward)           Start     Ordered   08/12/22 1240  Do not attempt resuscitation (DNR)  Continuous       Question Answer Comment  In the event of cardiac or respiratory ARREST Do not call a "code blue"   In the event of cardiac or respiratory ARREST Do not perform Intubation, CPR, defibrillation or ACLS   In the event of cardiac or respiratory ARREST Use medication by any route, position, wound care, and other measures to relive pain and suffering. May use oxygen, suction and manual treatment of airway obstruction as needed for comfort.      08/12/22 1239           Code Status History     Date Active Date Inactive Code Status Order ID Comments User Context   08/12/2022 0841 08/12/2022 1239 Full Code [id number]  Norval Morton, MD ED   08/09/2022 1828 08/10/2022 1846 Full Code [id number]  Lorella Nimrod, MD Inpatient   08/08/2022 1737 08/09/2022 1828 DNR [id number]  Lorella Nimrod, MD ED   03/26/2021 1212 03/29/2021 2026 Full Code [id number]  Collier Bullock, MD ED   09/22/2020 2151 09/25/2020 1801 Full Code [id number]  Rise Patience, MD Inpatient   04/06/2017 2145 04/08/2017 1934 Full Code [id number]  Milagros Loll, MD ED   03/08/2017 1841 03/18/2017 1602 Full Code [id number]  Cathlyn Parsons, PA-C Inpatient   03/08/2017 1841 03/08/2017 1841 Full Code [id number]  Cathlyn Parsons, PA-C Inpatient   03/06/2017 1641 03/08/2017 1814 Full Code [id number]  Traci Sermon, PA-C ED   03/06/2017 1433 03/06/2017 1641 Full Code [id number]  Traci Sermon, PA-C ED   02/09/2017 2347 02/15/2017 1900 Full Code [id number]  Danford, Suann Larry, MD Inpatient         IV Access:   Peripheral IV   Procedures and diagnostic studies:   No results found.   Medical Consultants:   None.   Subjective:    [name] complaining of abdominal pain.  Objective:    Vitals:   08/15/22 1242 08/15/22 2115 08/16/22 0439 08/16/22 0813  BP: 129/79 (!) 147/85 136/82 136/75  Pulse: 79 88  90  Resp: '16  20 20  '$ Temp: 98.4 F (36.9 C) 97.8 F (36.6 C)  97.7 F (36.5 C)  TempSrc: Oral Axillary  Axillary  SpO2: 100% 97% 98% 98%  Weight:      Height:       SpO2: 98 %   Intake/Output Summary (Last 24 hours) at 08/16/2022 1151 Last data filed at 08/15/2022 1321 Gross per 24 hour  Intake --  Output 500 ml  Net -500 ml   Filed Weights   08/11/22 2352  Weight: 95.7 kg    Exam: General exam: In no acute distress. Respiratory system: Good air movement and clear to auscultation. Cardiovascular system: S1 & S2 heard, RRR.  No JVD. Gastrointestinal system: Abdomen is nondistended, soft and nontender.  Extremities: No pedal edema. Skin: No rashes, lesions or ulcers   Data Reviewed:    Labs: Basic Metabolic Panel: Recent Labs  Lab 08/12/22 0100 08/13/22 0855 08/14/22 0301 08/15/22 0324 08/16/22 0348  NA 143 146* 151* 144 148*  K 4.9 4.8 5.0 4.2 4.7  CL 112* 117* 119* 118* 118*  CO2 17* 15* 20* 20* 16*  GLUCOSE 80 74 94 165* 93  BUN 56* 48* 45* 37* 34*  CREATININE 2.93* 2.31* 1.92* 1.77* 1.51*  CALCIUM 9.3 9.0 9.4 8.9 9.7   GFR Estimated Creatinine Clearance: 33.4 mL/min (A) (by C-G formula based on SCr of 1.51 mg/dL (H)). Liver Function Tests: Recent Labs  Lab 08/12/22 0100 08/13/22 0855 08/14/22 0301 08/15/22 0324 08/16/22 0348  AST 71* 56* 41 108* 115*  ALT 122* 92* 79* 120* 142*  ALKPHOS 241* 230* 215* 171* 224*  BILITOT  2.2* 1.9* 1.4* 1.1 1.0  PROT 7.3 6.1* 6.8 6.2* 6.6  ALBUMIN 2.4* 2.3* 2.0* 1.9* 1.9*   Recent Labs  Lab 08/12/22 0100 08/13/22 0855 08/14/22 0301 08/15/22 0324  LIPASE 153* 66* 48 44   No results for input(s): "AMMONIA" in the last 168 hours. Coagulation profile No results for input(s): "INR", "PROTIME" in the last 168 hours. COVID-19 Labs  No results for input(s): "DDIMER", "FERRITIN", "LDH", "CRP" in the last 72 hours.  Lab Results  Component Value Date   SARSCOV2NAA NEGATIVE 08/08/2022   [place] NEGATIVE 03/26/2021   [place] NEGATIVE 09/22/2020    CBC: Recent Labs  Lab 08/12/22 0100 08/13/22 0855 08/14/22 0301 08/15/22 0324 08/16/22 0348  WBC 31.6* 29.7* 25.5* 18.7* 17.4*  NEUTROABS 27.8* 23.9* 20.0* 13.7* 12.7*  HGB 11.2* 11.2* 10.3* 9.3* 10.5*  HCT 35.8* 34.6* 31.2* 28.4* 32.0*  MCV 100.3* 97.5 95.7 96.3 96.4  PLT 349 331 334 285 286   Cardiac Enzymes: No results for input(s): "CKTOTAL", "CKMB", "CKMBINDEX", "TROPONINI" in the last 168 hours. BNP (last 3 results) No results for input(s): "PROBNP" in the last 8760 hours. CBG: No results for input(s): "GLUCAP" in the last 168 hours. D-Dimer: No results for input(s): "DDIMER" in the last 72 hours. Hgb A1c: No results for input(s): "HGBA1C" in the last 72 hours. Lipid Profile: No results for input(s): "CHOL", "HDL", "LDLCALC", "TRIG", "CHOLHDL", "LDLDIRECT" in the last 72 hours. Thyroid function studies: No results for input(s): "TSH", "T4TOTAL", "T3FREE", "THYROIDAB" in the last 72 hours.  Invalid input(s): "FREET3" Anemia work up: No results for input(s): "VITAMINB12", "FOLATE", "FERRITIN", "TIBC", "IRON", "RETICCTPCT" in the last 72 hours. Sepsis Labs: Recent Labs  Lab 08/12/22 0100 08/13/22 0855 08/14/22 0301 08/15/22 0324 08/16/22 0348  PROCALCITON  --  1.42 1.08 0.78  --   WBC 31.6* 29.7* 25.5* 18.7* 17.4*  LATICACIDVEN 1.9  --   --   --   --    Microbiology Recent Results (from  the past 240 hour(s))  SARS Coronavirus 2 by RT PCR (hospital order, performed in [facility] hospital lab) *cepheid single result test*     Status: None   Collection Time: 08/08/22 12:11 AM   Specimen: Nasal Swab  Result Value Ref Range Status   SARS Coronavirus 2 by RT PCR NEGATIVE NEGATIVE Final    Comment: (NOTE) SARS-CoV-2 target nucleic acids are NOT DETECTED.  The SARS-CoV-2 RNA is generally detectable in upper and lower respiratory specimens during the acute phase of infection. The lowest concentration of SARS-CoV-2 viral copies this assay can detect  is 250 copies / mL. A negative result does not preclude SARS-CoV-2 infection and should not be used as the sole basis for treatment or other patient management decisions.  A negative result may occur with improper specimen collection / handling, submission of specimen other than nasopharyngeal swab, presence of viral mutation(s) within the areas targeted by this assay, and inadequate number of viral copies (<250 copies / mL). A negative result must be combined with clinical observations, patient history, and epidemiological information.  Fact Sheet for Patients:   https://www.patel.info/  Fact Sheet for Healthcare Providers: https://hall.com/  This test is not yet approved or  cleared by the [place] FDA and has been authorized for detection and/or diagnosis of SARS-CoV-2 by FDA under an Emergency Use Authorization (EUA).  This EUA will remain in effect (meaning this test can be used) for the duration of the COVID-19 declaration under Section 564(b)(1) of the Act, 21 U.S.C. section 360bbb-3(b)(1), unless the authorization is terminated or revoked sooner.  Performed at [facility], [address] [place], [place] [address]   Blood culture (routine x 2)     Status: None   Collection Time: 08/08/22 12:11 AM   Specimen: BLOOD  Result Value Ref Range Status   Specimen  Description BLOOD BLOOD RIGHT ARM  Final   Special Requests   Final    BOTTLES DRAWN AEROBIC AND ANAEROBIC Blood Culture results may not be optimal due to an inadequate volume of blood received in culture bottles   Culture   Final    NO GROWTH 5 DAYS Performed at [facility], [address] [place], [place] [address]    Report Status 08/13/2022 FINAL  Final  Blood culture (routine x 2)     Status: None   Collection Time: 08/08/22 12:11 AM   Specimen: BLOOD  Result Value Ref Range Status   Specimen Description BLOOD BLOOD LEFT ARM  Final   Special Requests   Final    BOTTLES DRAWN AEROBIC AND ANAEROBIC Blood Culture results may not be optimal due to an inadequate volume of blood received in culture bottles   Culture   Final    NO GROWTH 5 DAYS Performed at [facility], [address], [place], [place] [address]    Report Status 08/13/2022 FINAL  Final  Urine Culture     Status: Abnormal   Collection Time: 08/08/22 12:11 AM   Specimen: Urine, Clean Catch  Result Value Ref Range Status   Specimen Description   Final    URINE, CLEAN CATCH Performed at [facility], [address] [place], [place] [address]    Special Requests   Final    NONE Performed at [facility], [address], [place], [place] [address]    Culture MULTIPLE SPECIES PRESENT, SUGGEST RECOLLECTION (A)  Final   Report Status 08/09/2022 FINAL  Final  Blood culture (routine x 2)     Status: None (Preliminary result)   Collection Time: 08/12/22  1:00 AM   Specimen: BLOOD LEFT ARM  Result Value Ref Range Status   Specimen Description BLOOD LEFT ARM  Final   Special Requests   Final    BOTTLES DRAWN AEROBIC AND ANAEROBIC Blood Culture adequate volume   Culture   Final    NO GROWTH 4 DAYS Performed at [facility], [place] [address], [place], [place] [address]    Report Status PENDING  Incomplete  Urine Culture     Status: Abnormal  Collection Time: 08/12/22  2:20  AM   Specimen: Urine, Clean Catch  Result Value Ref Range Status   Specimen Description URINE, CLEAN CATCH  Final   Special Requests   Final    NONE Performed at [facility], [place] [address] [place], [place] [address]    Culture 60,000 COLONIES/mL YEAST (A)  Final   Report Status 08/13/2022 FINAL  Final  Blood culture (routine x 2)     Status: None (Preliminary result)   Collection Time: 08/13/22  8:55 AM   Specimen: BLOOD  Result Value Ref Range Status   Specimen Description BLOOD SITE NOT SPECIFIED  Final   Special Requests   Final    BOTTLES DRAWN AEROBIC AND ANAEROBIC Blood Culture results may not be optimal due to an inadequate volume of blood received in culture bottles   Culture   Final    NO GROWTH 3 DAYS Performed at [facility], [place] [address], [place], [place] [address]    Report Status PENDING  Incomplete  C Difficile Quick Screen w PCR reflex     Status: None   Collection Time: 08/13/22  3:34 PM   Specimen: STOOL  Result Value Ref Range Status   C Diff antigen NEGATIVE NEGATIVE Final   C Diff toxin NEGATIVE NEGATIVE Final   C Diff interpretation No C. difficile detected.  Final    Comment: Performed at [facility], [place] [address], [place], [place] [address]  Gastrointestinal Panel by PCR , Stool     Status: None   Collection Time: 08/13/22  3:34 PM   Specimen: STOOL  Result Value Ref Range Status   Campylobacter species NOT DETECTED NOT DETECTED Final   Plesimonas shigelloides NOT DETECTED NOT DETECTED Final   Salmonella species NOT DETECTED NOT DETECTED Final   Yersinia enterocolitica NOT DETECTED NOT DETECTED Final   Vibrio species NOT DETECTED NOT DETECTED Final   Vibrio cholerae NOT DETECTED NOT DETECTED Final   Enteroaggregative E coli (EAEC) NOT DETECTED NOT DETECTED Final   Enteropathogenic E coli (EPEC) NOT DETECTED NOT DETECTED Final   Enterotoxigenic E coli (ETEC) NOT DETECTED NOT DETECTED Final   Shiga like toxin producing E coli  (STEC) NOT DETECTED NOT DETECTED Final   Shigella/Enteroinvasive E coli (EIEC) NOT DETECTED NOT DETECTED Final   Cryptosporidium NOT DETECTED NOT DETECTED Final   Cyclospora cayetanensis NOT DETECTED NOT DETECTED Final   Entamoeba histolytica NOT DETECTED NOT DETECTED Final   Giardia lamblia NOT DETECTED NOT DETECTED Final   Adenovirus F40/41 NOT DETECTED NOT DETECTED Final   Astrovirus NOT DETECTED NOT DETECTED Final   Norovirus GI/GII NOT DETECTED NOT DETECTED Final   Rotavirus A NOT DETECTED NOT DETECTED Final   Sapovirus (I, II, IV, and V) NOT DETECTED NOT DETECTED Final    Comment: Performed at [facility], [address] [place], [place] [address]     Medications:    carvedilol  3.125 mg Oral BID WC   Chlorhexidine Gluconate Cloth  6 each Topical Daily   heparin  5,000 Units Subcutaneous Q8H   lamoTRIgine  50 mg Oral BID   pantoprazole (PROTONIX) IV  40 mg Intravenous Q24H   sodium chloride flush  3 mL Intravenous Q12H   Continuous Infusions:  azithromycin 500 mg (08/16/22 0955)   cefTRIAXone (ROCEPHIN)  IV 2 g (08/16/22 0826)      LOS: 4 days   Charlynne Cousins  [facility]  08/16/2022, 11:51 AM

## 2022-08-16 NOTE — Plan of Care (Signed)
  Problem: Clinical Measurements: Goal: Will remain free from infection Outcome: Progressing   Problem: Safety: Goal: Ability to remain free from injury will improve Outcome: Progressing   

## 2022-08-16 NOTE — Progress Notes (Signed)
Palliative:  Consult received and chart reviewed. Went to bedside, spoke with RN. Daughter not at bedside. Call to daughter - she recalls our visit from last week in the [facility] ED.   She would like to meet tomorrow morning ~9 am.   Juel Burrow, DNP, [facility] Palliative Medicine Team Team Phone # [phone]  Pager # [phone]  NO CHARGE

## 2022-08-16 NOTE — Consult Note (Signed)
Referring Provider:  [facility] Primary Care Physician:  Vivi Barrack, MD Primary Gastroenterologist:  Dr. Cristina Gong  Reason for Consultation: Nausea, vomiting, abdominal pain, abnormal CT showing ileus  HPI: [name] is a 81 y.o. female with multiple medical comorbidities including history of CHF, seizure disorder, TIA, advanced dementia and behavioral disorder presented to the hospital with vomiting.  Looks like she was admitted to the hospital earlier this month with AKI and urosepsis as well as aspiration pneumonia.  Palliative care was consulted and she was discharged home with home hospice.  She was brought back to hospital because of abdominal pain vomiting and fever.  She was found to have significant leukocytosis with white count of 31.6.  Stable hemoglobin.  Also has elevated LFTs since prior admission dated back to August 08, 2022.  See s/p cholecystectomy.  C. difficile and GI pathogen panel negative.  CT abdomen pelvis without contrast showed worsening air-fluid levels with dilated loops of small bowel towards the inflammatory changes related to urinary bladder.  GI is consulted for further evaluation.  Patient seen and examined at bedside.  Patient is not able to provide any history.   Past Medical History:  Diagnosis Date   CHF (congestive heart failure) (HCC)    Chronic venous insufficiency    Colon, diverticulosis 06/2010   outpatient colonoscopy by Dr. Cristina Gong.  Need record   DDD (degenerative disc disease), lumbosacral    Depression    DJD (degenerative joint disease), multiple sites    Low back pain worst   GERD (gastroesophageal reflux disease)    Gout    Gout    Uric Acid level 4.2 on 300 allopurinol   History of cervical cancer 1972   Hx-TIA (transient ischemic attack) 05/13/2001   Right facial numbness   Hx-TIA (transient ischemic attack) 05/13/2001   Looking back in Epic, the patient had a hospitalization in July 2002 for evaluation of questionable TIA  (s/s of right facial numbness associated with mild blurred vision in her right eye, diaphoresis, and mild confusion) with negative CT head and negative MRI/MRA and was started on Plavix after being heparinized. She was to have possible follow up with Neurology for reevaluation of the need for Pl   Hyperlipidemia    Hypertension    Keloid 08/01/2018   Memory disorder 12/20/2016   Mitral valve prolapse 10/27/1999   Subsequent 2D echo show normal mitral valve   Osteoarthritis of hip    bilateral hips   Ovarian cancer ([place]) 1972   S/P oophorectomy   Psychosis ([place])    Recurrent boils    History of MRSA skin infections with abscess   Seizures ([place])    Sensorineural hearing loss of both ears    Subdural hematoma ([place]) 01/26/2017   bilateral   Uncomplicated opioid dependence ([place]) 01/31/2018    Past Surgical History:  Procedure Laterality Date   ABDOMINAL HYSTERECTOMY     1972   BURR HOLE Bilateral 02/12/2017   Procedure: Haskell Flirt;  Surgeon: Earnie Larsson, MD;  Location: [facility];  Service: Neurosurgery;  Laterality: Bilateral;   CERVICAL DISCECTOMY  7/07   C5-C6   JOINT REPLACEMENT     bilateral hip replacement   LUMBAR DISC SURGERY     L5-S1 7/07   LUMBAR LAMINECTOMY/DECOMPRESSION MICRODISCECTOMY N/A 02/02/2016   Procedure: L4-5 Decompression;  Surgeon: Marybelle Killings, MD;  Location: [facility];  Service: Orthopedics;  Laterality: N/A;   OOPHORECTOMY     1972 for ovarian cancer  Prior to Admission medications   Medication Sig Start Date End Date Taking? Authorizing Provider  carvedilol (COREG) 3.125 MG tablet Take 1 tablet (3.125 mg total) by mouth 2 (two) times daily with a meal. 02/07/22  Yes Pemberton, Greer Ee, MD  lamoTRIgine (LAMICTAL) 25 MG tablet Take 2 tablets twice a day Patient taking differently: Take 50 mg by mouth 2 (two) times daily. 07/25/22  Yes Cameron Sprang, MD  nystatin (MYCOSTATIN/NYSTOP) powder Apply 1 application. topically 3 (three) times daily as needed (yeast  infection). 01/10/22  Yes Vivi Barrack, MD  acetaminophen (TYLENOL) 500 MG tablet '500mg'$  at night and every 6 hours as needed. Patient not taking: Reported on 08/12/2022 01/10/22   Vivi Barrack, MD  allopurinol (ZYLOPRIM) 300 MG tablet TAKE 1 TABLET BY MOUTH  DAILY Patient not taking: Reported on 08/12/2022 03/13/22   Vivi Barrack, MD  ascorbic acid (VITAMIN C) 500 MG tablet Take 500 mg by mouth daily. Patient not taking: Reported on 08/12/2022    [provider]  atorvastatin (LIPITOR) 40 MG tablet TAKE 1 TABLET BY MOUTH  DAILY Patient not taking: Reported on 08/12/2022 02/17/22   Vivi Barrack, MD  buPROPion (WELLBUTRIN XL) 300 MG 24 hr tablet Take 1 tablet (300 mg total) by mouth every morning. Patient not taking: Reported on 08/12/2022 03/28/22   Arfeen, Arlyce Harman, MD  cyanocobalamin 1000 MCG tablet Take 1 tablet (1,000 mcg total) by mouth daily. Patient not taking: Reported on 08/12/2022 08/11/22 09/10/22  Carlyle Lipa, MD  diclofenac Sodium (VOLTAREN) 1 % GEL Apply 4 g topically 4 (four) times daily as needed (for pain). Patient not taking: Reported on 08/12/2022    [provider]  donepezil (ARICEPT) 5 MG tablet Take 1 tablet (5 mg total) by mouth at bedtime. Patient not taking: Reported on 08/12/2022 07/25/22 07/25/23  Cameron Sprang, MD  fluticasone [facility]) 50 MCG/ACT nasal spray Place 1-2 sprays into both nostrils daily as needed for allergies or rhinitis. Patient not taking: Reported on 08/12/2022    [provider]  OLANZapine (ZYPREXA) 2.5 MG tablet Take 1 tab at bedtime Patient not taking: Reported on 08/12/2022 03/28/22   Arfeen, Arlyce Harman, MD  pantoprazole (PROTONIX) 40 MG tablet TAKE 1 TABLET BY MOUTH TWICE  DAILY BEFORE MEALS Patient not taking: Reported on 08/12/2022 05/23/22   Vivi Barrack, MD  polyethylene glycol (MIRALAX / GLYCOLAX) 17 g packet Take 17 g by mouth daily. Patient not taking: Reported on 08/12/2022 08/11/22   Carlyle Lipa, MD    Scheduled Meds:  carvedilol  3.125 mg Oral BID WC   Chlorhexidine Gluconate Cloth  6 each Topical Daily   heparin  5,000 Units Subcutaneous Q8H   lamoTRIgine  50 mg Oral BID   metoCLOPramide (REGLAN) injection  5 mg Intravenous Q8H   [START ON 08/17/2022] pantoprazole  40 mg Oral Daily   sodium chloride flush  3 mL Intravenous Q12H   Continuous Infusions:  azithromycin 500 mg (08/16/22 0955)   cefTRIAXone (ROCEPHIN)  IV 2 g (08/16/22 0826)   dextrose 75 mL/hr at 08/16/22 1249   PRN Meds:.acetaminophen **OR** acetaminophen, albuterol, HYDROmorphone (DILAUDID) injection, ondansetron (ZOFRAN) IV, mouth rinse, oxyCODONE  Allergies as of 08/11/2022 - Review Complete 08/11/2022  Allergen Reaction Noted   Aspirin Other (See Comments)    Sulfamethoxazole-trimethoprim Itching     Family History  Problem Relation Age of Onset   Diabetes Mother    Heart disease Mother  Hearing loss Mother    Stroke Mother    Post-traumatic stress disorder Father    Schizophrenia Maternal Grandmother    Diabetes Maternal Grandfather    Other Brother        brother died in mental health hospital   Kidney cancer Neg Hx    Bladder Cancer Neg Hx     Social History   Socioeconomic History   Marital status: Widowed    Spouse name: Not on file   Number of children: 3   Years of education: 15   Highest education level: Not on file  Occupational History   Occupation: Retired  Tobacco Use   Smoking status: Former    Packs/day: 0.10    Years: 35.00    Total pack years: 3.50    Types: Cigarettes    Quit date: 04/08/2013    Years since quitting: 9.3   Smokeless tobacco: Never   Tobacco comments:    smoked a few when her son died in 30-Jan-2013   Vaping Use   Vaping Use: Never used  Substance and Sexual Activity   Alcohol use: No    Alcohol/week: 0.0 standard drinks of alcohol   Drug use: No   Sexual activity: Never  Other Topics Concern   Not on file  Social History Narrative    Lives with daughter in [place]. Daughter is primary caretaker, surrogate decision-maker, and arranges for [facility] aide and other caregivers to supervise Ms. Bruington at home. Ambulatory   Social Determinants of Health   Financial Resource Strain: Not on file  Food Insecurity: No Food Insecurity (08/09/2022)   Hunger Vital Sign    Worried About Running Out of Food in the Last Year: Never true    Ran Out of Food in the Last Year: Never true  Transportation Needs: No Transportation Needs (08/09/2022)   PRAPARE - Hydrologist (Medical): No    Lack of Transportation (Non-Medical): No  Physical Activity: Not on file  Stress: Not on file  Social Connections: Not on file  Intimate Partner Violence: Not At Risk (08/09/2022)   Humiliation, Afraid, Rape, and Kick questionnaire    Fear of Current or Ex-Partner: No    Emotionally Abused: No    Physically Abused: No    Sexually Abused: No    Review of Systems: Unable to obtain  Physical Exam: Vital signs: Vitals:   08/16/22 0439 08/16/22 0813  BP: 136/82 136/75  Pulse:  90  Resp: 20 20  Temp:  97.7 F (36.5 C)  SpO2: 98% 98%   Last BM Date : 08/13/22 General:   Alert patient, resting comfortably in bed, somewhat confused, not providing any history Lungs: No visible respiratory distress Heart:  Regular rate and rhythm Abdomen: Soft, nontender, nondistended, bowel sounds present, no peritoneal signs Rectal:  Deferred  GI:  Lab Results: Recent Labs    08/14/22 0301 08/15/22 0324 08/16/22 0348  WBC 25.5* 18.7* 17.4*  HGB 10.3* 9.3* 10.5*  HCT 31.2* 28.4* 32.0*  PLT 334 285 286   BMET Recent Labs    08/14/22 0301 08/15/22 0324 08/16/22 0348  NA 151* 144 148*  K 5.0 4.2 4.7  CL 119* 118* 118*  CO2 20* 20* 16*  GLUCOSE 94 165* 93  BUN 45* 37* 34*  CREATININE 1.92* 1.77* 1.51*  CALCIUM 9.4 8.9 9.7   LFT Recent Labs    08/16/22 0348  PROT 6.6  ALBUMIN 1.9*  AST 115*  ALT 142*  ALKPHOS 224*  BILITOT 1.0   PT/INR No results for input(s): "LABPROT", "INR" in the last 72 hours.   Studies/Results: No results found.  Impression/Plan: -Abnormal CT scan concerning for focal narrowing in the distal small bowel likely related to the inflammatory changes of the urinary bladder. -Nausea and vomiting.  Could be related to sepsis/UTI -abnormal LFTs.  Could be related to sepsis.  Recommendations -------------------------- -Patient not able to provide any history.  No family at bedside.  Discussed with RN at bedside.  Looks like family has decided for palliative care/comfort care.  -Hopefully treatment of UTI/urosepsis will make improvement in inflammatory changes seen in the small bowel.  -No further work-up from GI standpoint at this time.  If family decides for full care,  consider upper GI small bowel follow-through   -GI will sign off.  Call us back if needed.     LOS: 4 days   Otis Brace  MD, FACP 08/16/2022, 2:14 PM  Contact #  [phone]

## 2022-08-17 DIAGNOSIS — Z7189 Other specified counseling: Secondary | ICD-10-CM

## 2022-08-17 DIAGNOSIS — Z515 Encounter for palliative care: Secondary | ICD-10-CM | POA: Diagnosis not present

## 2022-08-17 DIAGNOSIS — Z66 Do not resuscitate: Secondary | ICD-10-CM

## 2022-08-17 DIAGNOSIS — J690 Pneumonitis due to inhalation of food and vomit: Secondary | ICD-10-CM | POA: Diagnosis not present

## 2022-08-17 DIAGNOSIS — A419 Sepsis, unspecified organism: Secondary | ICD-10-CM | POA: Diagnosis not present

## 2022-08-17 DIAGNOSIS — R109 Unspecified abdominal pain: Secondary | ICD-10-CM | POA: Diagnosis not present

## 2022-08-17 DIAGNOSIS — F03918 Unspecified dementia, unspecified severity, with other behavioral disturbance: Secondary | ICD-10-CM | POA: Diagnosis not present

## 2022-08-17 DIAGNOSIS — N179 Acute kidney failure, unspecified: Secondary | ICD-10-CM | POA: Diagnosis not present

## 2022-08-17 LAB — COMPREHENSIVE METABOLIC PANEL
ALT: 123 U/L — ABNORMAL HIGH (ref 0–44)
AST: 79 U/L — ABNORMAL HIGH (ref 15–41)
Albumin: 1.8 g/dL — ABNORMAL LOW (ref 3.5–5.0)
Alkaline Phosphatase: 220 U/L — ABNORMAL HIGH (ref 38–126)
Anion gap: 11 (ref 5–15)
BUN: 31 mg/dL — ABNORMAL HIGH (ref 8–23)
CO2: 17 mmol/L — ABNORMAL LOW (ref 22–32)
Calcium: 8.8 mg/dL — ABNORMAL LOW (ref 8.9–10.3)
Chloride: 116 mmol/L — ABNORMAL HIGH (ref 98–111)
Creatinine, Ser: 1.48 mg/dL — ABNORMAL HIGH (ref 0.44–1.00)
GFR, Estimated: 36 mL/min — ABNORMAL LOW (ref >60)
Glucose, Bld: 134 mg/dL — ABNORMAL HIGH (ref 70–99)
Potassium: 4.4 mmol/L (ref 3.5–5.1)
Sodium: 144 mmol/L (ref 135–145)
Total Bilirubin: 0.9 mg/dL (ref 0.3–1.2)
Total Protein: 6.1 g/dL — ABNORMAL LOW (ref 6.5–8.1)

## 2022-08-17 LAB — CBC WITH DIFFERENTIAL/PLATELET
Abs Immature Granulocytes: 0.64 10*3/uL — ABNORMAL HIGH (ref 0.00–0.07)
Basophils Absolute: 0.1 10*3/uL (ref 0.0–0.1)
Basophils Relative: 1 %
Eosinophils Absolute: 0.4 10*3/uL (ref 0.0–0.5)
Eosinophils Relative: 3 %
HCT: 34.4 % — ABNORMAL LOW (ref 36.0–46.0)
Hemoglobin: 10.8 g/dL — ABNORMAL LOW (ref 12.0–15.0)
Immature Granulocytes: 4 %
Lymphocytes Relative: 16 %
Lymphs Abs: 2.4 10*3/uL (ref 0.7–4.0)
MCH: 30.9 pg (ref 26.0–34.0)
MCHC: 31.4 g/dL (ref 30.0–36.0)
MCV: 98.6 fL (ref 80.0–100.0)
Monocytes Absolute: 0.9 10*3/uL (ref 0.1–1.0)
Monocytes Relative: 6 %
Neutro Abs: 11.1 10*3/uL — ABNORMAL HIGH (ref 1.7–7.7)
Neutrophils Relative %: 70 %
Platelets: 198 10*3/uL (ref 150–400)
RBC: 3.49 MIL/uL — ABNORMAL LOW (ref 3.87–5.11)
RDW: 17.7 % — ABNORMAL HIGH (ref 11.5–15.5)
WBC: 15.6 10*3/uL — ABNORMAL HIGH (ref 4.0–10.5)
nRBC: 0.1 % (ref 0.0–0.2)

## 2022-08-17 LAB — CULTURE, BLOOD (ROUTINE X 2)
Culture: NO GROWTH
Special Requests: ADEQUATE

## 2022-08-17 MED ORDER — DEXTROSE 5 % IV SOLN: INTRAVENOUS | Status: DC

## 2022-08-17 MED ORDER — FLEET ENEMA 7-19 GM/118ML RE ENEM
1.0000 | ENEMA | Freq: Once | RECTAL | Status: AC
  Administered 2022-08-17: 1 via RECTAL
  Filled 2022-08-17: qty 1

## 2022-08-17 MED ORDER — HYDROMORPHONE HCL 1 MG/ML IJ SOLN
0.5000 mg | INTRAMUSCULAR | Status: DC | PRN
  Administered 2022-08-17 (×2): 0.5 mg via INTRAVENOUS
  Filled 2022-08-17 (×2): qty 0.5

## 2022-08-17 MED ORDER — HYDROMORPHONE HCL 1 MG/ML IJ SOLN
1.0000 mg | INTRAMUSCULAR | Status: DC | PRN
  Administered 2022-08-17 – 2022-08-18 (×6): 1 mg via INTRAVENOUS
  Filled 2022-08-17 (×6): qty 1

## 2022-08-17 MED ORDER — SODIUM CHLORIDE 0.9 % IV SOLN: INTRAVENOUS | Status: DC

## 2022-08-17 MED ORDER — POLYETHYLENE GLYCOL 3350 17 G PO PACK
17.0000 g | PACK | Freq: Two times a day (BID) | ORAL | Status: DC
  Filled 2022-08-17 (×2): qty 1

## 2022-08-17 NOTE — Progress Notes (Signed)
[facility] ([facility])  Received referral from [facility] for hospice inpatient facility at [facility] at discharge. PMT and MD aware as well. Hospice home notified of request, however there are no beds available today. [name] is patient contact and is agreeable to transfer once patient is a little more comfortable. [name] would like to try bowel regimen to see if this helps also prior to moving.   Daughter [name] would like IVF and to continue laxatives for relief once discharge.   Please do not hesitate to call with questions.  [facility] liaison will be in touch tomorrow to follow up.   Thank you Clementeen Hoof, RN, [name] Liaison [phone]

## 2022-08-17 NOTE — Progress Notes (Signed)
[name]  [name] 81 y.o. 03/13/1941  CC: Abnormal CT scan, ileus   Subjective: Patient seen and examined at bedside.  Patient's daughter at bedside.  No bowel movement in last few days.  Daughter is concerned about more abdominal distention.  ROS : Not able to to obtain   Objective: Vital signs in last 24 hours: Vitals:   08/17/22 0821 08/17/22 0901  BP: 101/81   Pulse: 91   Resp: 17   Temp: 98.2 F (36.8 C)   SpO2: 97% 93%    Physical Exam:  General -elderly patient, resting comfortably in the bed Abdomen -moderately distended with hypoactive bowel sounds, nontender, no peritoneal signs  Lab Results: Recent Labs    08/16/22 0348 08/17/22 0457  NA 148* 144  K 4.7 4.4  CL 118* 116*  CO2 16* 17*  GLUCOSE 93 134*  BUN 34* 31*  CREATININE 1.51* 1.48*  CALCIUM 9.7 8.8*   Recent Labs    08/16/22 0348 08/17/22 0457  AST 115* 79*  ALT 142* 123*  ALKPHOS 224* 220*  BILITOT 1.0 0.9  PROT 6.6 6.1*  ALBUMIN 1.9* 1.8*   Recent Labs    08/16/22 0348 08/17/22 0326  WBC 17.4* 15.6*  NEUTROABS 12.7* 11.1*  HGB 10.5* 10.8*  HCT 32.0* 34.4*  MCV 96.4 98.6  PLT 286 198   No results for input(s): "LABPROT", "INR" in the last 72 hours.    Assessment/Plan: -Abnormal CT scan concerning for focal narrowing in the distal small bowel likely related to the inflammatory changes of the urinary bladder. -Nausea and vomiting.  Could be related to sepsis/UTI/ileus -abnormal LFTs.  Could be related to sepsis. -Ileus   Recommendations -------------------------- -Long discussion with patient's daughter at bedside.  She is concerned  for worsening abdominal distention.  They would like to see abdominal distention improved prior to transferring her to hospice care -Different options discussed.  She is agreeable for Fleet enema as well as NG suction along with trial of MiraLAX. -Discussed with hospitalist.  Order placed. -We will repeat  abdominal x-ray tomorrow morning -We will follow   Otis Brace MD, [place] 08/17/2022, 2:24 PM  Contact #  [phone]

## 2022-08-17 NOTE — Progress Notes (Signed)
[facility] PROGRESS NOTE    Progress Note  LASHEKA KEMPNER  ZOX:[id number] DOB: [birth year] DOA: 08/11/2022 PCP: Vivi Barrack, MD     Brief Narrative:   [name] is an 81 y.o. female past medical history of essential hypertension, chronic diastolic heart failure, TIA, seizure, advanced dementia with behavioral disorder services comes in complaining of vomiting recently discharged from the hospital on 08/10/2022 for acute kidney injury sepsis secondary to UTI and aspiration pneumonia.  Palliative care was consulted and she was discharged home with hospice.  After she arrived home she started developing abdominal pain and vomiting temperature of 100.4 tachycardic white count of 31 x-ray noted to have airspace disease at the right lung which is improved, urinalysis was suspicious for UTI blood cultures were obtained.   Assessment/Plan:   Sepsis secondary to aspiration pneumonia: She was febrile on admission tachycardic. She was started empirically on Rocephin and azithromycin. Blood cultures have been negative x2. We will complete a 8-day course of IV antibiotics in house. Remain afebrile leukocytosis is improving. Patient has an extremely poor prognosis she has ongoing aspiration. UA did not show any signs of infection.  Vaginal yeast infection Was given a dose of fluconazole on 08/14/2022.  Nausea and vomiting/abdominal pain likely due to Ileus: CT scan of the abdomen pelvis shows small dilated loops with narrowing present. General surgery was consulted they relate unlikely SBO possible ileus, and I agree. GI was consulted recommended no further intervention and recommended continued conservative management he agreed the patient will benefit from comfort measures.  No further GI work-up needed. Have asked the GI doctor to come and discuss with the family the prognosis.  Acute kidney injury on chronic kidney disease stage IIIb/hydroureteronephrosis: CT showed  improving there hydronephrosis and hydroureter ureter bilaterally. A Foley was placed she was started on IV fluids. Creatinine on admission was 3 now 1.5 after fluid resuscitation.  Baseline runs around 1.3-1.5.  Hypernatremia: Now resolved with D5W continue D5W for an additional 24 hours.  Elevated LFTs: Likely due to sepsis trending down.  Chronic diastolic heart failure: BNP of 58 chest x-ray shows no pulmonary edema. She has had significant decreased oral intake.  GERD: Continue PPI.  Dementia with behavioral disturbances: Continue Namenda and Aricept she is at high risk of aspiration.  History of schizophrenia/seizure disorder: Continue Lamictal.  Sacral decubitus ulcer stage II present on admission: RN Pressure Injury Documentation: Pressure Injury 08/08/22 Coccyx Medial Stage 2 -  Partial thickness loss of dermis presenting as a shallow open injury with a red, pink wound bed without slough. Pt with pink and red skin noted to coccyx, daughter reports it has been there for several we (Active)  08/08/22 1030  Location: Coccyx  Location Orientation: Medial  Staging: Stage 2 -  Partial thickness loss of dermis presenting as a shallow open injury with a red, pink wound bed without slough.  Wound Description (Comments): Pt with pink and red skin noted to coccyx, daughter reports it has been there for several weeks and daughter has been keeping it clean to prevent infection. No signs of infection noted at this time, Scant bleeding noted. Mepilex applied  Present on Admission: Yes  Dressing Type Foam - Lift dressing to assess site every shift 08/16/22 2110     Pressure Injury 08/13/22 Toe (Comment  which one) Anterior;Left Deep Tissue Pressure Injury - Purple or maroon localized area of discolored intact skin or blood-filled blister due to damage of underlying soft tissue from pressure  and/or shear. (Active)  08/13/22 1942  Location: Toe (Comment  which one)  Location Orientation:  Anterior;Left  Staging: Deep Tissue Pressure Injury - Purple or maroon localized area of discolored intact skin or blood-filled blister due to damage of underlying soft tissue from pressure and/or shear.  Wound Description (Comments):   Present on Admission: Yes  Dressing Type None 08/16/22 2110    Estimated body mass index is 36.21 kg/m as calculated from the following:   Height as of this encounter: '5\' 4"'$  (1.626 m).   Weight as of this encounter: 95.7 kg.  DVT prophylaxis: lovenox Family Communication:none Status is: Inpatient Remains inpatient appropriate because: Admitted for sepsis poor prognosis    Code Status:     Code Status Orders  (From admission, onward)           Start     Ordered   08/12/22 1240  Do not attempt resuscitation (DNR)  Continuous       Question Answer Comment  In the event of cardiac or respiratory ARREST Do not call a "code blue"   In the event of cardiac or respiratory ARREST Do not perform Intubation, CPR, defibrillation or ACLS   In the event of cardiac or respiratory ARREST Use medication by any route, position, wound care, and other measures to relive pain and suffering. May use oxygen, suction and manual treatment of airway obstruction as needed for comfort.      08/12/22 1239           Code Status History     Date Active Date Inactive Code Status Order ID Comments User Context   08/12/2022 0841 08/12/2022 1239 Full Code [id number]  Norval Morton, MD ED   08/09/2022 1828 08/10/2022 1846 Full Code [id number]  Lorella Nimrod, MD Inpatient   08/08/2022 1737 08/09/2022 1828 DNR [id number]  Lorella Nimrod, MD ED   03/26/2021 1212 03/29/2021 2026 Full Code [id number]  Collier Bullock, MD ED   09/22/2020 2151 09/25/2020 1801 Full Code [id number]  Rise Patience, MD Inpatient   04/06/2017 2145 04/08/2017 1934 Full Code [id number]  Milagros Loll, MD ED   03/08/2017 1841 03/18/2017 1602 Full Code [id number]  Cathlyn Parsons, PA-C Inpatient    03/08/2017 1841 03/08/2017 1841 Full Code [id number]  Cathlyn Parsons, PA-C Inpatient   03/06/2017 1641 03/08/2017 1814 Full Code [id number]  Traci Sermon, PA-C ED   03/06/2017 1433 03/06/2017 1641 Full Code [id number]  Traci Sermon, PA-C ED   02/09/2017 2347 02/15/2017 1900 Full Code [id number]  Danford, Suann Larry, MD Inpatient         IV Access:   Peripheral IV   Procedures and diagnostic studies:   No results found.   Medical Consultants:   None.   Subjective:    REHAM SLABAUGH daughter relates that the patient is having abdominal pain.  Objective:    Vitals:   08/16/22 2039 08/17/22 0508 08/17/22 0821 08/17/22 0901  BP: 133/66 139/78 101/81   Pulse: 85 88 91   Resp: '15 18 17   '$ Temp:   98.2 F (36.8 C)   TempSrc:   Oral   SpO2: 94% 96% 97% 93%  Weight:      Height:       SpO2: 93 %   Intake/Output Summary (Last 24 hours) at 08/17/2022 0927 Last data filed at 08/16/2022 1600 Gross per 24 hour  Intake 686.95 ml  Output 350 ml  Net 336.95 ml    [employer]  08/11/22 2352  Weight: 95.7 kg    Exam: General exam: In no acute distress. Respiratory system: Good air movement and clear to auscultation. Cardiovascular system: S1 & S2 heard, RRR. No JVD. Gastrointestinal system: Abdomen is nondistended, soft and nontender.  Extremities: No pedal edema. Skin: No rashes, lesions or ulcers Psychiatry: Judgement and insight appear normal. Mood & affect appropriate.  Data Reviewed:    Labs: Basic Metabolic Panel: Recent Labs  Lab 08/13/22 0855 08/14/22 0301 08/15/22 0324 08/16/22 0348 08/17/22 0457  NA 146* 151* 144 148* 144  K 4.8 5.0 4.2 4.7 4.4  CL 117* 119* 118* 118* 116*  CO2 15* 20* 20* 16* 17*  GLUCOSE 74 94 165* 93 134*  BUN 48* 45* 37* 34* 31*  CREATININE 2.31* 1.92* 1.77* 1.51* 1.48*  CALCIUM 9.0 9.4 8.9 9.7 8.8*    GFR Estimated Creatinine Clearance: 34 mL/min (A) (by C-G formula based on SCr of 1.48 mg/dL  (H)). Liver Function Tests: Recent Labs  Lab 08/13/22 0855 08/14/22 0301 08/15/22 0324 08/16/22 0348 08/17/22 0457  AST 56* 41 108* 115* 79*  ALT 92* 79* 120* 142* 123*  ALKPHOS 230* 215* 171* 224* 220*  BILITOT 1.9* 1.4* 1.1 1.0 0.9  PROT 6.1* 6.8 6.2* 6.6 6.1*  ALBUMIN 2.3* 2.0* 1.9* 1.9* 1.8*    Recent Labs  Lab 08/12/22 0100 08/13/22 0855 08/14/22 0301 08/15/22 0324  LIPASE 153* 66* 48 44    No results for input(s): "AMMONIA" in the last 168 hours. Coagulation profile No results for input(s): "INR", "PROTIME" in the last 168 hours. COVID-19 Labs  No results for input(s): "DDIMER", "FERRITIN", "LDH", "CRP" in the last 72 hours.  Lab Results  Component Value Date   SARSCOV2NAA NEGATIVE 08/08/2022   [place] NEGATIVE 03/26/2021   [place] NEGATIVE 09/22/2020    CBC: Recent Labs  Lab 08/13/22 0855 08/14/22 0301 08/15/22 0324 08/16/22 0348 08/17/22 0326  WBC 29.7* 25.5* 18.7* 17.4* 15.6*  NEUTROABS 23.9* 20.0* 13.7* 12.7* 11.1*  HGB 11.2* 10.3* 9.3* 10.5* 10.8*  HCT 34.6* 31.2* 28.4* 32.0* 34.4*  MCV 97.5 95.7 96.3 96.4 98.6  PLT 331 334 285 286 198    Cardiac Enzymes: No results for input(s): "CKTOTAL", "CKMB", "CKMBINDEX", "TROPONINI" in the last 168 hours. BNP (last 3 results) No results for input(s): "PROBNP" in the last 8760 hours. CBG: No results for input(s): "GLUCAP" in the last 168 hours. D-Dimer: No results for input(s): "DDIMER" in the last 72 hours. Hgb A1c: No results for input(s): "HGBA1C" in the last 72 hours. Lipid Profile: No results for input(s): "CHOL", "HDL", "LDLCALC", "TRIG", "CHOLHDL", "LDLDIRECT" in the last 72 hours. Thyroid function studies: No results for input(s): "TSH", "T4TOTAL", "T3FREE", "THYROIDAB" in the last 72 hours.  Invalid input(s): "FREET3" Anemia work up: No results for input(s): "VITAMINB12", "FOLATE", "FERRITIN", "TIBC", "IRON", "RETICCTPCT" in the last 72 hours. Sepsis Labs: Recent Labs  Lab  08/12/22 0100 08/13/22 0855 08/14/22 0301 08/15/22 0324 08/16/22 0348 08/17/22 0326  PROCALCITON  --  1.42 1.08 0.78  --   --   WBC 31.6* 29.7* 25.5* 18.7* 17.4* 15.6*  LATICACIDVEN 1.9  --   --   --   --   --     Microbiology Recent Results (from the past 240 hour(s))  SARS Coronavirus 2 by RT PCR (hospital order, performed in [facility] hospital lab) *cepheid single result test*     Status: None   Collection Time: 08/08/22 12:11 AM   Specimen: Nasal Swab  Result Value Ref  Range Status   SARS Coronavirus 2 by RT PCR NEGATIVE NEGATIVE Final    Comment: (NOTE) SARS-CoV-2 target nucleic acids are NOT DETECTED.  The SARS-CoV-2 RNA is generally detectable in upper and lower respiratory specimens during the acute phase of infection. The lowest concentration of SARS-CoV-2 viral copies this assay can detect is 250 copies / mL. A negative result does not preclude SARS-CoV-2 infection and should not be used as the sole basis for treatment or other patient management decisions.  A negative result may occur with improper specimen collection / handling, submission of specimen other than nasopharyngeal swab, presence of viral mutation(s) within the areas targeted by this assay, and inadequate number of viral copies (<250 copies / mL). A negative result must be combined with clinical observations, patient history, and epidemiological information.  Fact Sheet for Patients:   https://www.patel.info/  Fact Sheet for Healthcare Providers: https://hall.com/  This test is not yet approved or  cleared by the [place] FDA and has been authorized for detection and/or diagnosis of SARS-CoV-2 by FDA under an Emergency Use Authorization (EUA).  This EUA will remain in effect (meaning this test can be used) for the duration of the COVID-19 declaration under Section 564(b)(1) of the Act, 21 U.S.C. section 360bbb-3(b)(1), unless the authorization is  terminated or revoked sooner.  Performed at [facility], [address] [place], [place] [address]   Blood culture (routine x 2)     Status: None   Collection Time: 08/08/22 12:11 AM   Specimen: BLOOD  Result Value Ref Range Status   Specimen Description BLOOD BLOOD RIGHT ARM  Final   Special Requests   Final    BOTTLES DRAWN AEROBIC AND ANAEROBIC Blood Culture results may not be optimal due to an inadequate volume of blood received in culture bottles   Culture   Final    NO GROWTH 5 DAYS Performed at [facility], [address] [place], [place] [address]    Report Status 08/13/2022 FINAL  Final  Blood culture (routine x 2)     Status: None   Collection Time: 08/08/22 12:11 AM   Specimen: BLOOD  Result Value Ref Range Status   Specimen Description BLOOD BLOOD LEFT ARM  Final   Special Requests   Final    BOTTLES DRAWN AEROBIC AND ANAEROBIC Blood Culture results may not be optimal due to an inadequate volume of blood received in culture bottles   Culture   Final    NO GROWTH 5 DAYS Performed at [facility], [address] [place], [place] [address]    Report Status 08/13/2022 FINAL  Final  Urine Culture     Status: Abnormal   Collection Time: 08/08/22 12:11 AM   Specimen: Urine, Clean Catch  Result Value Ref Range Status   Specimen Description   Final    URINE, CLEAN CATCH Performed at [facility], [address], [place], [place] [address]    Special Requests   Final    NONE Performed at [facility], [address], [place], [place] [address]    Culture MULTIPLE SPECIES PRESENT, SUGGEST RECOLLECTION (A)  Final   Report Status 08/09/2022 FINAL  Final  Blood culture (routine x 2)     Status: None   Collection Time: 08/12/22  1:00 AM   Specimen: BLOOD LEFT ARM  Result Value Ref Range Status   Specimen Description BLOOD LEFT ARM  Final   Special Requests   Final    BOTTLES  DRAWN AEROBIC AND ANAEROBIC Blood Culture  adequate volume   Culture   Final    NO GROWTH 5 DAYS Performed at [facility], [place] [address], [place], [place] [address]    Report Status 08/17/2022 FINAL  Final  Urine Culture     Status: Abnormal   Collection Time: 08/12/22  2:20 AM   Specimen: Urine, Clean Catch  Result Value Ref Range Status   Specimen Description URINE, CLEAN CATCH  Final   Special Requests   Final    NONE Performed at [facility], [place] [address], [place], [place] [address]    Culture 60,000 COLONIES/mL YEAST (A)  Final   Report Status 08/13/2022 FINAL  Final  Blood culture (routine x 2)     Status: None (Preliminary result)   Collection Time: 08/13/22  8:55 AM   Specimen: BLOOD  Result Value Ref Range Status   Specimen Description BLOOD SITE NOT SPECIFIED  Final   Special Requests   Final    BOTTLES DRAWN AEROBIC AND ANAEROBIC Blood Culture results may not be optimal due to an inadequate volume of blood received in culture bottles   Culture   Final    NO GROWTH 4 DAYS Performed at [facility], [place] [address], [place], [place] [address]    Report Status PENDING  Incomplete  C Difficile Quick Screen w PCR reflex     Status: None   Collection Time: 08/13/22  3:34 PM   Specimen: STOOL  Result Value Ref Range Status   C Diff antigen NEGATIVE NEGATIVE Final   C Diff toxin NEGATIVE NEGATIVE Final   C Diff interpretation No C. difficile detected.  Final    Comment: Performed at [facility], [place] [address], [place], [place] [address]  Gastrointestinal Panel by PCR , Stool     Status: None   Collection Time: 08/13/22  3:34 PM   Specimen: STOOL  Result Value Ref Range Status   Campylobacter species NOT DETECTED NOT DETECTED Final   Plesimonas shigelloides NOT DETECTED NOT DETECTED Final   Salmonella species NOT DETECTED NOT DETECTED Final   Yersinia enterocolitica NOT DETECTED NOT DETECTED Final   Vibrio species NOT DETECTED NOT DETECTED Final   Vibrio cholerae NOT DETECTED NOT  DETECTED Final   Enteroaggregative E coli (EAEC) NOT DETECTED NOT DETECTED Final   Enteropathogenic E coli (EPEC) NOT DETECTED NOT DETECTED Final   Enterotoxigenic E coli (ETEC) NOT DETECTED NOT DETECTED Final   Shiga like toxin producing E coli (STEC) NOT DETECTED NOT DETECTED Final   Shigella/Enteroinvasive E coli (EIEC) NOT DETECTED NOT DETECTED Final   Cryptosporidium NOT DETECTED NOT DETECTED Final   Cyclospora cayetanensis NOT DETECTED NOT DETECTED Final   Entamoeba histolytica NOT DETECTED NOT DETECTED Final   Giardia lamblia NOT DETECTED NOT DETECTED Final   Adenovirus F40/41 NOT DETECTED NOT DETECTED Final   Astrovirus NOT DETECTED NOT DETECTED Final   Norovirus GI/GII NOT DETECTED NOT DETECTED Final   Rotavirus A NOT DETECTED NOT DETECTED Final   Sapovirus (I, II, IV, and V) NOT DETECTED NOT DETECTED Final    Comment: Performed at [facility], [address] [place], [place] [address]     Medications:    carvedilol  3.125 mg Oral BID WC   Chlorhexidine Gluconate Cloth  6 each Topical Daily   heparin  5,000 Units Subcutaneous Q8H   lamoTRIgine  50 mg Oral BID   pantoprazole  40 mg Oral  Daily   sodium chloride flush  3 mL Intravenous Q12H   Continuous Infusions:  azithromycin Stopped (08/16/22 1055)   cefTRIAXone (ROCEPHIN)  IV 2 g (08/17/22 8638)   dextrose 75 mL/hr at 08/17/22 0205      LOS: 5 days   Charlynne Cousins  [facility]  08/17/2022, 9:27 AM

## 2022-08-17 NOTE — Consult Note (Signed)
Consultation Note Date: 08/17/2022   Patient Name: [name]  DOB: 12-26-1940  MRN: [id number]  Age / Sex: 81 y.o., female  PCP: Shelby Barrack, MD Referring Physician: Aileen Drake, Shelby Klippel, MD  Reason for Consultation: Establishing goals of care  HPI/Patient Profile: 81 y.o. female  with past medical history of essential hypertension, chronic diastolic heart failure, TIA, seizure, advanced dementia with behavioral disorder admitted on 08/11/2022 with vomiting. Patiently recently dc'd home with hospice from [facility]. PMT was involved during that hospitalization. This admission patient diagnosed with sepsis secondary to aspiration pneumonia.  Patient also with ongoing nausea and abdominal pain; ileus suspected.  Conservative management recommended.  PMT consulted to discuss goals of care.  Clinical Assessment and Goals of Care: I have reviewed medical records including EPIC notes, labs and imaging, assessed the patient and then met with patient's daughter [name] to discuss diagnosis prognosis, GOC, EOL wishes, disposition and options.  I met with [name] during previous admission and she remembers me and remembers role of palliative medicine team.  We reviewed events following discharge from [facility]. We reviewed current situation and current diagnoses. [name] shares that her goal is to focus on her mother's comfort and relieve suffering.  [name] tells me she would like her mother placed in a hospice facility for aggressive symptom management.  She tells me she prefers hospice facility in [place].  She would like to speak with hospice liaison to better understand care provided at hospice facility.  We discussed patient's symptom management -currently has Dilaudid every 4 hours.  [name] feels that patient is currently in pain and we see patient grimacing as we stand at bedside.  We discussed option to increase frequency of pain  medicine.  We discussed concern that pain medicine will worsen ileus.  [name] understands and accepts risks as she wants pain managed.  Provided [name] with my number and requested that she call me if she feels pain is not adequately managed.  Called to hospice liaison discussed situation; liaison plans to reach out to daughter.  Questions and concerns were addressed. The family was encouraged to call with questions or concerns.  Primary Decision Maker NEXT OF KIN -daughter [name]    SUMMARY OF RECOMMENDATIONS    - family requesting move to hospice facility, requesting discussion with hospice liaison - increased frequency of pain medication, family accepts risks as they want to ensure patient is comfortable - family requests to continue other interventions such as IV fluids at this point until plan is clarified further - awaiting discussion with GI later today  Code Status/Advance Care Planning: DNR  Discharge Planning: Hospice facility      Primary Diagnoses: Present on Admission:  Dementia with behavioral disturbance ([place])  GERD  AKI (acute kidney injury) ([place])  Aspiration pneumonia ([place])  Sepsis secondary to UTI ([place])  Nausea and vomiting  Abdominal pain  Hydroureteronephrosis  Elevated liver enzymes   I have reviewed the medical record, interviewed the patient and family, and examined the patient. The following aspects are pertinent.  Past Medical History:  Diagnosis Date   CHF (congestive heart failure) (HCC)    Chronic venous insufficiency    Colon, diverticulosis 06/2010   outpatient colonoscopy by Dr. Cristina Gong.  Need record   DDD (degenerative disc disease), lumbosacral    Depression    DJD (degenerative joint disease), multiple sites    Low back pain worst   GERD (gastroesophageal reflux disease)    Gout    Gout  Uric Acid level 4.2 on 300 allopurinol   History of cervical cancer 01-05-71   Hx-TIA (transient ischemic attack) 05/13/2001   Right facial numbness    Hx-TIA (transient ischemic attack) 05/13/2001   Looking back in Epic, the patient had a hospitalization in July 2002 for evaluation of questionable TIA (s/s of right facial numbness associated with mild blurred vision in her right eye, diaphoresis, and mild confusion) with negative CT head and negative MRI/MRA and was started on Plavix after being heparinized. She was to have possible follow up with Neurology for reevaluation of the need for Pl   Hyperlipidemia    Hypertension    Keloid 08/01/2018   Memory disorder 12/20/2016   Mitral valve prolapse 10/27/1999   Subsequent 2D echo show normal mitral valve   Osteoarthritis of hip    bilateral hips   Ovarian cancer ([place]) 01/05/1971   S/P oophorectomy   Psychosis ([place])    Recurrent boils    History of MRSA skin infections with abscess   Seizures ([place])    Sensorineural hearing loss of both ears    Subdural hematoma ([place]) 01/26/2017   bilateral   Uncomplicated opioid dependence ([place]) 01/31/2018   Social History   Socioeconomic History   Marital status: Widowed    Spouse name: Not on file   Number of children: 3   Years of education: 15   Highest education level: Not on file  Occupational History   Occupation: Retired  Tobacco Use   Smoking status: Former    Packs/day: 0.10    Years: 35.00    Total pack years: 3.50    Types: Cigarettes    Quit date: 04/08/2013    Years since quitting: 9.3   Smokeless tobacco: Never   Tobacco comments:    smoked a few when her son died in 01-05-13   Vaping Use   Vaping Use: Never used  Substance and Sexual Activity   Alcohol use: No    Alcohol/week: 0.0 standard drinks of alcohol   Drug use: No   Sexual activity: Never  Other Topics Concern   Not on file  Social History Narrative   Lives with daughter in [place]. Daughter is primary caretaker, surrogate decision-maker, and arranges for [facility] aide and other caregivers to supervise Ms. Kutner at home. Ambulatory   Social Determinants of Health    Financial Resource Strain: Not on file  Food Insecurity: No Food Insecurity (08/09/2022)   Hunger Vital Sign    Worried About Running Out of Food in the Last Year: Never true    Ran Out of Food in the Last Year: Never true  Transportation Needs: No Transportation Needs (08/09/2022)   PRAPARE - Hydrologist (Medical): No    Lack of Transportation (Non-Medical): No  Physical Activity: Not on file  Stress: Not on file  Social Connections: Not on file   Family History  Problem Relation Age of Onset   Diabetes Mother    Heart disease Mother    Hearing loss Mother    Stroke Mother    Post-traumatic stress disorder Father    Schizophrenia Maternal Grandmother    Diabetes Maternal Grandfather    Other Brother        brother died in mental health hospital   Kidney cancer Neg Hx    Bladder Cancer Neg Hx    Scheduled Meds:  carvedilol  3.125 mg Oral BID WC   Chlorhexidine Gluconate Cloth  6 each Topical Daily  heparin  5,000 Units Subcutaneous Q8H   lamoTRIgine  50 mg Oral BID   pantoprazole  40 mg Oral Daily   sodium chloride flush  3 mL Intravenous Q12H   Continuous Infusions:  azithromycin 500 mg (08/17/22 0932)   cefTRIAXone (ROCEPHIN)  IV 2 g (08/17/22 0822)   dextrose 75 mL/hr at 08/17/22 0933   PRN Meds:.acetaminophen **OR** acetaminophen, albuterol, HYDROmorphone (DILAUDID) injection, ondansetron (ZOFRAN) IV, mouth rinse, oxyCODONE Allergies  Allergen Reactions   Aspirin Other (See Comments)    Bleeding from vagina    Sulfamethoxazole-Trimethoprim Itching   Review of Systems  Unable to perform ROS: Dementia    Physical Exam Constitutional:      General: She is not in acute distress.    Appearance: She is ill-appearing.     Comments: Awake, nonverbal  Cardiovascular:     Rate and Rhythm: Normal rate.  Pulmonary:     Effort: Pulmonary effort is normal.  Skin:    General: Skin is warm and dry.     Vital Signs: BP 101/81 (BP  Location: Left Arm)   Pulse 91   Temp 98.2 F (36.8 C) (Oral)   Resp 17   Ht 5' 4"  (1.626 m)   Wt 95.7 kg   SpO2 93%   BMI 36.21 kg/m  Pain Scale: PAINAD POSS *See Group Information*: S-Acceptable,Sleep, easy to arouse Pain Score: 0-No pain   SpO2: SpO2: 93 % O2 Device:SpO2: 93 % O2 Flow Rate: .   IO: Intake/output summary:  Intake/Output Summary (Last 24 hours) at 08/17/2022 1307 Last data filed at 08/16/2022 1600 Gross per 24 hour  Intake 686.95 ml  Output 350 ml  Net 336.95 ml    LBM: Last BM Date : 08/13/22 Baseline Weight: Weight: 95.7 kg Most recent weight: Weight: 95.7 kg     Palliative Assessment/Data: PPS 20%     *Please note that this is a verbal dictation therefore any spelling or grammatical errors are due to the "[place] [employer]" system interpretation.   Juel Burrow, DNP, AGNP-C Palliative Medicine Team [phone] Pager: [phone]

## 2022-08-17 NOTE — Plan of Care (Signed)

## 2022-08-17 NOTE — Plan of Care (Signed)
  Problem: Clinical Measurements: Goal: Will remain free from infection Outcome: Progressing   Problem: Safety: Goal: Ability to remain free from injury will improve Outcome: Progressing   Problem: Skin Integrity: Goal: Risk for impaired skin integrity will decrease Outcome: Progressing   

## 2022-08-17 NOTE — Progress Notes (Signed)
Patient with increased oxygen needs with coarse lung sounds. Primary MD notified of decreasing IV fluids and that patient family had declined NG tube. On assessment patient struggling to breath and noticeable bile in mouth and nose. Oral suction performed and copious amounts of green bile suctioned out as well as copious projectile emesis. Patient family member informed of purpose of NG tube and was agreeable but this RN was unable to advance NG tube without resistance. Patient appears more comfortable and breathing has eased but concern for continued emesis.

## 2022-08-18 ENCOUNTER — Inpatient Hospital Stay (HOSPITAL_COMMUNITY): Payer: Medicare Other

## 2022-08-18 DIAGNOSIS — N3001 Acute cystitis with hematuria: Secondary | ICD-10-CM

## 2022-08-18 LAB — BASIC METABOLIC PANEL
Anion gap: 11 (ref 5–15)
BUN: 31 mg/dL — ABNORMAL HIGH (ref 8–23)
CO2: 18 mmol/L — ABNORMAL LOW (ref 22–32)
Calcium: 8.9 mg/dL (ref 8.9–10.3)
Chloride: 115 mmol/L — ABNORMAL HIGH (ref 98–111)
Creatinine, Ser: 1.6 mg/dL — ABNORMAL HIGH (ref 0.44–1.00)
GFR, Estimated: 32 mL/min — ABNORMAL LOW (ref >60)
Glucose, Bld: 90 mg/dL (ref 70–99)
Potassium: 4.2 mmol/L (ref 3.5–5.1)
Sodium: 144 mmol/L (ref 135–145)

## 2022-08-18 LAB — CBC WITH DIFFERENTIAL/PLATELET
Abs Immature Granulocytes: 0.79 10*3/uL — ABNORMAL HIGH (ref 0.00–0.07)
Basophils Absolute: 0.1 10*3/uL (ref 0.0–0.1)
Basophils Relative: 1 %
Eosinophils Absolute: 0.4 10*3/uL (ref 0.0–0.5)
Eosinophils Relative: 2 %
HCT: 30.4 % — ABNORMAL LOW (ref 36.0–46.0)
Hemoglobin: 9.7 g/dL — ABNORMAL LOW (ref 12.0–15.0)
Immature Granulocytes: 5 %
Lymphocytes Relative: 15 %
Lymphs Abs: 2.6 10*3/uL (ref 0.7–4.0)
MCH: 31.2 pg (ref 26.0–34.0)
MCHC: 31.9 g/dL (ref 30.0–36.0)
MCV: 97.7 fL (ref 80.0–100.0)
Monocytes Absolute: 1 10*3/uL (ref 0.1–1.0)
Monocytes Relative: 6 %
Neutro Abs: 12 10*3/uL — ABNORMAL HIGH (ref 1.7–7.7)
Neutrophils Relative %: 71 %
Platelets: 270 10*3/uL (ref 150–400)
RBC: 3.11 MIL/uL — ABNORMAL LOW (ref 3.87–5.11)
RDW: 17.7 % — ABNORMAL HIGH (ref 11.5–15.5)
WBC: 16.8 10*3/uL — ABNORMAL HIGH (ref 4.0–10.5)
nRBC: 0.2 % (ref 0.0–0.2)

## 2022-08-18 LAB — CULTURE, BLOOD (ROUTINE X 2): Culture: NO GROWTH

## 2022-08-18 MED ORDER — LORAZEPAM 2 MG/ML IJ SOLN
1.0000 mg | INTRAMUSCULAR | Status: DC | PRN
  Administered 2022-08-18: 1 mg via INTRAVENOUS
  Filled 2022-08-18: qty 1

## 2022-08-18 NOTE — Progress Notes (Signed)
-   looks like patient was discharged today to  [place].   - patient was not seen.   Otis Brace MD, [place] 08/18/2022, 1:46 PM  Contact #  [phone]

## 2022-08-18 NOTE — Progress Notes (Signed)
Daily Progress Note   Patient Name: [name]       Date: 08/18/2022 DOB: 04/11/1941  Age: 81 y.o. MRN#: [id number] Attending Physician: Charlynne Cousins, MD Primary Care Physician: Vivi Barrack, MD Admit Date: 08/11/2022 Length of Stay: 6 days  Reason for Consultation/Follow-up: Establishing goals of care  HPI/Patient Profile:  81 y.o. female  with past medical history of essential hypertension, chronic diastolic heart failure, TIA, seizure, advanced dementia with behavioral disorder admitted on 08/11/2022 with vomiting. Patiently recently dc'd home with hospice from [facility]. PMT was involved during that hospitalization. This admission patient diagnosed with sepsis secondary to aspiration pneumonia.  Patient also with ongoing nausea and abdominal pain; ileus suspected.  Conservative management recommended.  PMT consulted to discuss goals of care.  Subjective:   Subjective: Chart Reviewed. Updates received. Patient Assessed. Created space and opportunity for patient  and family to explore thoughts and feelings regarding current medical situation.  Today's Discussion: Today I met with the patient at the bedside.  She is unable to meaningfully communicate.  Her daughter was bedside holding her mom's hand.  I allowed ample time for patient's daughter to discuss her feelings about her mother's current state.  She describes some happy memories with her mom including the labs to eat.  We discussed the pain and difficulty of dementia where you "lose a person before you lost a person."  She accepts now that her mother is nearing end-of-life.  She appears more comfortable today.  We spent substantial time helping her with grieving.  I was joined later by the hospice liaison we discussed that they are waiting for a bed at [facility].  We confirmed that she is on comfort care while inpatient.  The hospice liaison noted that a bed appears to be available today and are hopeful  for discharge soon.  The patient's daughter seems comforted by this.  I offered that she can call our service for any questions or concerns while the patient is inpatient.  I provided emotional and general support through therapeutic listening, empathy, sharing of stories, therapeutic touch, hug, and other techniques. I answered all questions and addressed all concerns to the best of my ability.  Review of Systems  Unable to perform ROS: Dementia    Objective:   Vital Signs:  BP 101/81 (BP Location: Left Arm)   Pulse (!) 103   Temp 98.8 F (37.1 C) (Axillary)   Resp 15   Ht 5' 4"  (1.626 m)   Wt 95.7 kg   SpO2 95%   BMI 36.21 kg/m   Physical Exam: Physical Exam Vitals and nursing note reviewed.  Constitutional:      General: She is not in acute distress.    Appearance: She is ill-appearing.  HENT:     Head: Normocephalic and atraumatic.  Pulmonary:     Effort: Pulmonary effort is normal. No respiratory distress.  Skin:    General: Skin is warm and dry.  Neurological:     Mental Status: She is unresponsive.     Palliative Assessment/Data: 10%   Assessment & Plan:   Impression: Present on Admission:  Dementia with behavioral disturbance ([place])  GERD  AKI (acute kidney injury) ([place])  Aspiration pneumonia (HCC)  Sepsis secondary to UTI (HCC)  Nausea and vomiting  Abdominal pain  Hydroureteronephrosis  Elevated liver enzymes  SUMMARY OF RECOMMENDATIONS   Anticipate transition to residential hospice soon Remain DNR and comfort care while inpatient Continue to request pain medicine as  needed PMT is available to assist with symptom management needs while inpatient  Symptom Management:  Dilaudid 1 mg IV every 2 hours as needed severe pain Ativan 1 mg IV every 4 hours as needed anxiety  Code Status: DNR  Prognosis: < 2 weeks  Discharge Planning: Hospice facility  Discussed with: Patient's daughter, nursing team, medical team, hospice liaison  Thank you  for allowing [place] to participate in the care of BRYANNAH BOSTON PMT will continue to support holistically.  Billing based on MDM: High  Problems Addressed: One acute or chronic illness or injury that poses a threat to life or bodily function  Amount and/or Complexity of Data: Category 3:Discussion of management or test interpretation with external physician/other qualified health care professional/appropriate source (not separately reported)  Risks: Parenteral controlled substances  Walden Field, NP Palliative Medicine Team  Team Phone # [phone] (Nights/Weekends)  06/28/2021, 8:17 AM

## 2022-08-18 NOTE — TOC Transition Note (Signed)
Transition of Care [facility]) - CM/SW Discharge Note   Patient Details  Name: [name] MRN: [id number] Date of Birth: [birth year]  Transition of [facility] CM/SW Contact:  Joanne Chars, LCSW Phone Number: 08/18/2022, 11:46 AM   Clinical Narrative:   Pt discharging to [facility] [place].  RN call report to [phone].      Final next level of care: [place] Barriers to Discharge: Barriers Resolved   Patient Goals and CMS Choice        Discharge Placement              Patient chooses bed at:  ([place]) Patient to be transferred to facility by: [place] Name of family member notified: daughter [name] in room Patient and family notified of of transfer: 08/18/22  Discharge Plan and Services   Discharge Planning Services: CM Consult                                 Social Determinants of Health ([place]) Interventions     Readmission Risk Interventions    08/10/2022   12:47 PM  Readmission Risk Prevention Plan  PCP or Specialist Appt within 3-5 Days Complete  HRI or [place] Complete  Social Work Consult for [place] Planning/Counseling Complete  Palliative Care Screening Complete

## 2022-08-18 NOTE — Progress Notes (Signed)
[facility] ([facility])  Received referral from [facility] for hospice inpatient facility at [facility] at discharge. PMT and MD aware as well.   [name] is patient contact and is agreeable to transfer today by [facility].   Please send signed DNR form with patient.   PIV can stay in place as well as the foley catheter. I recommended PRN ativan prior to transport.   Please contact [facility] once consents are completed.   RN please call report to [phone].  Thank you, Clementeen Hoof, RN, [facility] Liaison [phone]

## 2022-08-18 NOTE — Discharge Summary (Signed)
Physician Discharge Summary  [name] MGQ:[id number] DOB: 20-Feb-1941 DOA: 08/11/2022  PCP: Vivi Barrack, MD  Admit date: 08/11/2022 Discharge date: 08/18/2022  Admitted From: Home Disposition:  Residential Hospice  Recommendations for Outpatient Follow-up:  Follow up with PCP in 1-2 weeks Please obtain BMP/CBC in one week   Home Health:No Equipment/Devices:None Discharge Condition:[place] CODE STATUS:DNR Diet recommendation: Heart Healthy   Brief/Interim Summary: 81 y.o. female past medical history of essential hypertension, chronic diastolic heart failure, TIA, seizure, advanced dementia with behavioral disorder services comes in complaining of vomiting recently discharged from the hospital on 08/10/2022 for acute kidney injury sepsis secondary to UTI and aspiration pneumonia.  Palliative care was consulted and she was discharged home with hospice.  After she arrived home she started developing abdominal pain and vomiting temperature of 100.4 tachycardic white count of 31 x-ray noted to have airspace disease at the right lung which is improved, urinalysis was suspicious for UTI blood cultures were obtained.    Discharge Diagnoses:  Principal Problem:   Sepsis secondary to UTI [facility]) Active Problems:   Aspiration pneumonia (HCC)   Nausea and vomiting   Abdominal pain   AKI (acute kidney injury) ([place])   Hydroureteronephrosis   Dementia with behavioral disturbance (HCC)   Elevated liver enzymes   Seizure disorder (HCC)   GERD  Sepsis secondary to aspiration pneumonia: She was febrile and tachycardic on admission she was started on IV Rocephin and azithromycin blood cultures were done which were negative x2 she defervesced leukocytosis improved. UA did not show any signs of infection. Extremely poor prognosis, was discussed with the family and the family agreed to meet with palliative care continue and later move towards comfort care.  The patient is at significant  leaks risk of aspiration and the daughter acknowledged that and she related she wanted to minimize oral medications. On the day of discharge the daughter related she did not want any more labs drawn imaging or IV antibiotics. She was only On medications for comfort.  Vaginal yeast infection: She was given fluconazole.  Nausea vomiting/abdominal pain likely due to ileus: CT scan of the abdomen pelvis showed ileus. Surgery recommended no surgical intervention is conservative management. GI was consulted recommended an abdominal x-ray and minimal interventions with conservative measures, after explaining this to the daughter the daughter decided no further interventions were needed she would like to move towards comfort measures. The daughter relates she wanted the patient to go to residential hospice facility.  Acute kidney injury on chronic kidney disease stage IIIb/hydroureteronephrosis: Noted.  Hypernatremia: Likely due to decreased oral intake this resolved with D5W.  Elevated LFT's: Likely due to sepsis.  Chronic diastolic heart failure: Noted.  GERD: Noted.  Dementia with behavioral disturbances: Noted all her medications were stopped as she was at significant risk of aspiration.  History of schizophrenia/seizure disorders: Noted.   Discharge Instructions  Discharge Instructions     Diet - low sodium heart healthy   Complete by: As directed    Increase activity slowly   Complete by: As directed    No wound care   Complete by: As directed       Allergies as of 08/18/2022       Reactions   Aspirin Other (See Comments)   Bleeding from vagina   Sulfamethoxazole-trimethoprim Itching        Medication List     STOP taking these medications    acetaminophen 500 MG tablet Commonly known as: TYLENOL   allopurinol 300  MG tablet Commonly known as: ZYLOPRIM   amoxicillin-clavulanate 250-62.5 MG/5ML suspension Commonly known as: AUGMENTIN   ascorbic acid  500 MG tablet Commonly known as: VITAMIN C   atorvastatin 40 MG tablet Commonly known as: LIPITOR   azithromycin 500 MG tablet Commonly known as: Zithromax   buPROPion 300 MG 24 hr tablet Commonly known as: WELLBUTRIN XL   carvedilol 3.125 MG tablet Commonly known as: COREG   cyanocobalamin 1000 MCG tablet   diclofenac Sodium 1 % Gel Commonly known as: VOLTAREN   donepezil 5 MG tablet Commonly known as: Aricept   fluticasone 50 MCG/ACT nasal spray Commonly known as: FLONASE   lamoTRIgine 25 MG tablet Commonly known as: LAMICTAL   nystatin powder Commonly known as: MYCOSTATIN/NYSTOP   OLANZapine 2.5 MG tablet Commonly known as: ZyPREXA   pantoprazole 40 MG tablet Commonly known as: PROTONIX   polyethylene glycol 17 g packet Commonly known as: MIRALAX / GLYCOLAX        Allergies  Allergen Reactions   Aspirin Other (See Comments)    Bleeding from vagina    Sulfamethoxazole-Trimethoprim Itching    Consultations: Gastroenterology Palliative care   Procedures/Studies: DG Abd Portable 1V  Result Date: 08/18/2022 CLINICAL DATA:  [id number] Ileus ([place]) [id number] EXAM: PORTABLE ABDOMEN - 1 VIEW COMPARISON:  CT 08/12/2022 FINDINGS: Multiple dilated loops of bowel in the abdomen. Multilevel degenerative changes the spine. Bilateral hip arthroplasties. IMPRESSION: Multiple dilated loops of bowel in the abdomen, compatible with ileus or obstruction. Electronically Signed   By: Maurine Simmering M.D.   On: 08/18/2022 08:30   DG Chest Port 1 View  Result Date: 08/12/2022 CLINICAL DATA:  Abdominal distention and vomiting. EXAM: PORTABLE CHEST 1 VIEW COMPARISON:  08/09/2022. FINDINGS: The heart size and mediastinal contours are stable. Mild airspace disease is present in the medial aspect of the right lung base. No effusion or pneumothorax. No acute osseous abnormality. IMPRESSION: Mild airspace disease at the right lung base, slightly improved from the prior exam. Electronically  Signed   By: Brett Fairy M.D.   On: 08/12/2022 01:37   CT ABDOMEN PELVIS WO CONTRAST  Result Date: 08/12/2022 CLINICAL DATA:  Nausea and vomiting with abdominal pain, initial encounter EXAM: CT ABDOMEN AND PELVIS WITHOUT CONTRAST TECHNIQUE: Multidetector CT imaging of the abdomen and pelvis was performed following the standard protocol without IV contrast. RADIATION DOSE REDUCTION: This exam was performed according to the departmental dose-optimization program which includes automated exposure control, adjustment of the mA and/or kV according to patient size and/or use of iterative reconstruction technique. COMPARISON:  08/08/2022 FINDINGS: Lower chest: No acute abnormality. Hepatobiliary: No focal liver abnormality is seen. Status post cholecystectomy. No biliary dilatation. Pancreas: Unremarkable. No pancreatic ductal dilatation or surrounding inflammatory changes. Spleen: Normal in size without focal abnormality. Adrenals/Urinary Tract: Adrenal glands are within normal limits. Kidneys are well visualized bilaterally. Previously seen hydronephrosis and hydroureter has improved significantly in the interval from the prior exam. Bladder remains distended with wall thickening and inflammatory changes anteriorly. Some air is noted along the anterior border of the bladder new from the prior exam. This may be related to recent instrumentation and diverticular change in this region. Stomach/Bowel: Mild diverticular change of the colon is noted. Colon is predominately decompressed. The appendix is not well seen although no inflammatory changes are noted. Stomach is well distended with ingested fluid. Duodenum and proximal jejunum are also distended with fluid this extends inferiorly towards the inflammatory change surrounding the bladder and has increased in the interval  from the prior exam. Some focal narrowing in the distal small bowel is likely present related to the inflammatory change of the bladder.  Vascular/Lymphatic: Aortic atherosclerosis. No enlarged abdominal or pelvic lymph nodes. Reproductive: Status post hysterectomy. No adnexal masses. Other: No abdominal wall hernia or abnormality. No abdominopelvic ascites. Musculoskeletal: Bilateral hip replacements are noted. No acute bony abnormality is noted. Degenerative changes of the lumbar spine are seen. IMPRESSION: Persistent inflammatory change of the anterior aspect of the bladder with findings suspicious for diverticular change. These changes correspond with the given clinical history of UTI and are very similar to that seen on the prior study. Diverticulosis without diverticulitis. Persistent and slightly worsened fluid-filled dilated loops of small bowel which lead towards the inflammatory changes involving the bladder. There is likely some mild narrowing of the distal small bowel in this region causing the more proximal dilatation. Improved but persistent hydronephrosis and hydroureter bilaterally Electronically Signed   By: Inez Catalina M.D.   On: 08/12/2022 00:45   DG Chest Port 1 View  Result Date: 08/09/2022 CLINICAL DATA:  Reason for exam: Cough, AMS, and dehydration. Pt hx of HTN, CHF, and mitral valve prolapse. Cough, altered mental status EXAM: PORTABLE CHEST - 1 VIEW COMPARISON:  08/07/2022 FINDINGS: Increased mild airspace opacities at the right lung base. Prominent right perihilar and left basilar interstitial opacities without convincing change. Heart size and mediastinal contours are within normal limits. Tortuous thoracic aorta. No effusion. Visualized bones unremarkable. IMPRESSION: Increased patchy right lower lobe airspace disease. Electronically Signed   By: Lucrezia Europe M.D.   On: 08/09/2022 10:33   US RENAL  Result Date: 08/08/2022 CLINICAL DATA:  81 year old female with acute kidney injury. EXAM: RENAL / URINARY TRACT ULTRASOUND COMPLETE COMPARISON:  CT Abdomen and Pelvis without contrast 0948 hours today. FINDINGS: Right  Kidney: Renal measurements: 9.5 x 4.9 x 5.3 cm = volume: 128 mL. Echogenic right renal cortex. The right hydronephrosis seen earlier today now appears resolved (image 4). Left Kidney: Renal measurements: 11.0 x 5.2 x 4.8 cm = volume: 146 mL. Echogenic left renal cortex (image 17). The left hydronephrosis seen earlier today appears resolved. Bladder: Decompressed now with partially visible Foley catheter balloon on image 32. Other: None. IMPRESSION: 1. Resolved bilateral hydronephrosis following Foley catheter decompression of the abnormal bladder (see CT Abdomen and Pelvis 0948 hours today). 2. Evidence of chronic medical renal disease. Electronically Signed   By: Genevie Ann M.D.   On: 08/08/2022 11:15   CT ABDOMEN PELVIS WO CONTRAST  Result Date: 08/08/2022 CLINICAL DATA:  Sepsis, dementia, altered mental status EXAM: CT ABDOMEN AND PELVIS WITHOUT CONTRAST TECHNIQUE: Multidetector CT imaging of the abdomen and pelvis was performed following the standard protocol without IV contrast. RADIATION DOSE REDUCTION: This exam was performed according to the departmental dose-optimization program which includes automated exposure control, adjustment of the mA and/or kV according to patient size and/or use of iterative reconstruction technique. COMPARISON:  12/21/2013 FINDINGS: Lower chest: No acute abnormality. Cardiomegaly. Left and right coronary artery calcifications. Hepatobiliary: No focal liver abnormality is seen. Status post cholecystectomy. No biliary dilatation. Pancreas: Unremarkable. No pancreatic ductal dilatation or surrounding inflammatory changes. Spleen: Normal in size without significant abnormality. Adrenals/Urinary Tract: Adrenal glands are unremarkable. Moderate bilateral hydronephrosis and hydroureter to the ureterovesicular junctions. No calculi or other obstructing etiology identified. Distended urinary bladder with extensive wall thickening and fat stranding anteriorly, with a diverticular  appearance of the bladder wall in this vicinity (series 2, image 64, series 6, image  83). Stomach/Bowel: Stomach is within normal limits. Appendix not clearly visualized. Multiple fluid-filled, although not overtly distended loops of small bowel throughout the abdomen. Gas and stool present throughout the colon to the rectum. Sigmoid diverticula. Vascular/Lymphatic: Aortic atherosclerosis. No enlarged abdominal or pelvic lymph nodes. Reproductive: Status post hysterectomy. Other: No abdominal wall hernia or abnormality. No ascites. Musculoskeletal: No acute osseous findings. Status post bilateral hip total arthroplasty. IMPRESSION: 1. Distended urinary bladder with extensive wall thickening and fat stranding anteriorly, with a diverticular appearance of the bladder wall in this vicinity. Underlying mass is not excluded. 2. Moderate bilateral hydronephrosis and hydroureter to the ureterovesicular junctions, presumably secondary to back pressure in the setting of urinary retention. No calculi or other obstructing etiology identified. 3. Multiple fluid-filled, although not overtly distended loops of small bowel throughout the abdomen. Gas and stool present throughout the colon to the rectum. Findings are suggestive of ileus. 4. Coronary artery disease. Aortic Atherosclerosis (ICD10-I70.0). Electronically Signed   By: Delanna Ahmadi M.D.   On: 08/08/2022 09:52   CT HEAD WO CONTRAST (5MM)  Result Date: 08/07/2022 CLINICAL DATA:  Altered mental status, dehydration, dementia EXAM: CT HEAD WITHOUT CONTRAST TECHNIQUE: Contiguous axial images were obtained from the base of the skull through the vertex without intravenous contrast. RADIATION DOSE REDUCTION: This exam was performed according to the departmental dose-optimization program which includes automated exposure control, adjustment of the mA and/or kV according to patient size and/or use of iterative reconstruction technique. COMPARISON:  MRI brain dated 03/29/2021  FINDINGS: Motion degraded images. Brain: No evidence of acute infarction, hemorrhage, hydrocephalus, extra-axial collection or mass lesion/mass effect. Subcortical white matter and periventricular small vessel ischemic changes. Old right thalamic lacunar infarct. Vascular: Intracranial atherosclerosis. Skull: Normal. Negative for fracture or focal lesion. Bilateral frontal burr holes. Sinuses/Orbits: The visualized paranasal sinuses are essentially clear. The mastoid air cells are unopacified. Other: None. IMPRESSION: Motion degraded images. No evidence of acute intracranial abnormality. Small vessel ischemic changes. Old right thalamic lacunar infarct. Electronically Signed   By: Julian Hy M.D.   On: 08/07/2022 23:18   DG Chest Portable 1 View  Result Date: 08/07/2022 CLINICAL DATA:  Weakness. Altered mental status. EXAM: PORTABLE CHEST 1 VIEW COMPARISON:  Chest radiograph and CT 03/26/2021 FINDINGS: Patient is rotated. The heart is grossly stable in size. There is aortic tortuosity. Low lung volumes with mild left lung base atelectasis. No pneumothorax or pleural effusion. No pulmonary edema. IMPRESSION: Low lung volumes with mild left lung base atelectasis. Electronically Signed   By: Keith Rake M.D.   On: 08/07/2022 23:05   (Echo, Carotid, EGD, Colonoscopy, ERCP)    Subjective: No complaints  Discharge Exam: Vitals:   08/18/22 0745 08/18/22 0913  BP:    Pulse: (!) 103   Resp: 15   Temp:  98.8 F (37.1 C)  SpO2: 95%    Vitals:   08/17/22 0901 08/17/22 1443 08/18/22 0745 08/18/22 0913  BP:      Pulse:   (!) 103   Resp:   15   Temp:  98.4 F (36.9 C)  98.8 F (37.1 C)  TempSrc:  Oral  Axillary  SpO2: 93%  95%   Weight:      Height:        General: Pt is alert, awake, not in acute distress Cardiovascular: RRR, S1/S2 +, no rubs, no gallops Respiratory: CTA bilaterally, no wheezing, no rhonchi Abdominal: Soft, NT, ND, bowel sounds + Extremities: no edema, no  cyanosis  The results of significant diagnostics from this hospitalization (including imaging, microbiology, ancillary and laboratory) are listed below for reference.     Microbiology: Recent Results (from the past 240 hour(s))  Blood culture (routine x 2)     Status: None   Collection Time: 08/12/22  1:00 AM   Specimen: BLOOD LEFT ARM  Result Value Ref Range Status   Specimen Description BLOOD LEFT ARM  Final   Special Requests   Final    BOTTLES DRAWN AEROBIC AND ANAEROBIC Blood Culture adequate volume   Culture   Final    NO GROWTH 5 DAYS Performed at [facility], [address] [place], [place] [address]    Report Status 08/17/2022 FINAL  Final  Urine Culture     Status: Abnormal   Collection Time: 08/12/22  2:20 AM   Specimen: Urine, Clean Catch  Result Value Ref Range Status   Specimen Description URINE, CLEAN CATCH  Final   Special Requests   Final    NONE Performed at [facility], [place] [address], [place], [place] [address]    Culture 60,000 COLONIES/mL YEAST (A)  Final   Report Status 08/13/2022 FINAL  Final  Blood culture (routine x 2)     Status: None   Collection Time: 08/13/22  8:55 AM   Specimen: BLOOD  Result Value Ref Range Status   Specimen Description BLOOD SITE NOT SPECIFIED  Final   Special Requests   Final    BOTTLES DRAWN AEROBIC AND ANAEROBIC Blood Culture results may not be optimal due to an inadequate volume of blood received in culture bottles   Culture   Final    NO GROWTH 5 DAYS Performed at [facility], [place] [address], [place], [place] [address]    Report Status 08/18/2022 FINAL  Final  C Difficile Quick Screen w PCR reflex     Status: None   Collection Time: 08/13/22  3:34 PM   Specimen: STOOL  Result Value Ref Range Status   C Diff antigen NEGATIVE NEGATIVE Final   C Diff toxin NEGATIVE NEGATIVE Final   C Diff interpretation No C. difficile detected.  Final    Comment: Performed at [facility], [place] [address], [place], [place] [address]  Gastrointestinal Panel by PCR , Stool     Status: None   Collection Time: 08/13/22  3:34 PM   Specimen: STOOL  Result Value Ref Range Status   Campylobacter species NOT DETECTED NOT DETECTED Final   Plesimonas shigelloides NOT DETECTED NOT DETECTED Final   Salmonella species NOT DETECTED NOT DETECTED Final   Yersinia enterocolitica NOT DETECTED NOT DETECTED Final   Vibrio species NOT DETECTED NOT DETECTED Final   Vibrio cholerae NOT DETECTED NOT DETECTED Final   Enteroaggregative E coli (EAEC) NOT DETECTED NOT DETECTED Final   Enteropathogenic E coli (EPEC) NOT DETECTED NOT DETECTED Final   Enterotoxigenic E coli (ETEC) NOT DETECTED NOT DETECTED Final   Shiga like toxin producing E coli (STEC) NOT DETECTED NOT DETECTED Final   Shigella/Enteroinvasive E coli (EIEC) NOT DETECTED NOT DETECTED Final   Cryptosporidium NOT DETECTED NOT DETECTED Final   Cyclospora cayetanensis NOT DETECTED NOT DETECTED Final   Entamoeba histolytica NOT DETECTED NOT DETECTED Final   Giardia lamblia NOT DETECTED NOT DETECTED Final   Adenovirus F40/41 NOT DETECTED NOT DETECTED Final   Astrovirus NOT DETECTED NOT DETECTED Final   Norovirus GI/GII NOT DETECTED NOT DETECTED Final   Rotavirus A NOT  DETECTED NOT DETECTED Final   Sapovirus (I, II, IV, and V) NOT DETECTED NOT DETECTED Final    Comment: Performed at [facility], [place] [place], [place] [address]     Labs: BNP (last 3 results) Recent Labs    08/14/22 0301  BNP 84.6   Basic Metabolic Panel: Recent Labs  Lab 08/14/22 0301 08/15/22 0324 08/16/22 0348 08/17/22 0457 08/18/22 0518  NA 151* 144 148* 144 144  K 5.0 4.2 4.7 4.4 4.2  CL 119* 118* 118* 116* 115*  CO2 20* 20* 16* 17* 18*  GLUCOSE 94 165* 93 134* 90  BUN 45* 37* 34* 31* 31*  CREATININE 1.92* 1.77* 1.51* 1.48* 1.60*  CALCIUM 9.4 8.9 9.7 8.8* 8.9   Liver Function Tests: Recent Labs  Lab 08/13/22 0855 08/14/22 0301  08/15/22 0324 08/16/22 0348 08/17/22 0457  AST 56* 41 108* 115* 79*  ALT 92* 79* 120* 142* 123*  ALKPHOS 230* 215* 171* 224* 220*  BILITOT 1.9* 1.4* 1.1 1.0 0.9  PROT 6.1* 6.8 6.2* 6.6 6.1*  ALBUMIN 2.3* 2.0* 1.9* 1.9* 1.8*   Recent Labs  Lab 08/12/22 0100 08/13/22 0855 08/14/22 0301 08/15/22 0324  LIPASE 153* 66* 48 44   No results for input(s): "AMMONIA" in the last 168 hours. CBC: Recent Labs  Lab 08/14/22 0301 08/15/22 0324 08/16/22 0348 08/17/22 0326 08/18/22 0518  WBC 25.5* 18.7* 17.4* 15.6* 16.8*  NEUTROABS 20.0* 13.7* 12.7* 11.1* 12.0*  HGB 10.3* 9.3* 10.5* 10.8* 9.7*  HCT 31.2* 28.4* 32.0* 34.4* 30.4*  MCV 95.7 96.3 96.4 98.6 97.7  PLT 334 285 286 198 270   Cardiac Enzymes: No results for input(s): "CKTOTAL", "CKMB", "CKMBINDEX", "TROPONINI" in the last 168 hours. BNP: Invalid input(s): "POCBNP" CBG: No results for input(s): "GLUCAP" in the last 168 hours. D-Dimer No results for input(s): "DDIMER" in the last 72 hours. Hgb A1c No results for input(s): "HGBA1C" in the last 72 hours. Lipid Profile No results for input(s): "CHOL", "HDL", "LDLCALC", "TRIG", "CHOLHDL", "LDLDIRECT" in the last 72 hours. Thyroid function studies No results for input(s): "TSH", "T4TOTAL", "T3FREE", "THYROIDAB" in the last 72 hours.  Invalid input(s): "FREET3" Anemia work up No results for input(s): "VITAMINB12", "FOLATE", "FERRITIN", "TIBC", "IRON", "RETICCTPCT" in the last 72 hours. Urinalysis    Component Value Date/Time   COLORURINE AMBER (A) 08/12/2022 0220   APPEARANCEUR TURBID (A) 08/12/2022 0220   APPEARANCEUR Clear 05/07/2017 1614   LABSPEC 1.017 08/12/2022 0220   PHURINE 5.0 08/12/2022 0220   GLUCOSEU NEGATIVE 08/12/2022 0220   GLUCOSEU NEGATIVE 07/31/2017 1511   HGBUR MODERATE (A) 08/12/2022 0220   HGBUR trace-intact 11/28/2007 1531   BILIRUBINUR NEGATIVE 08/12/2022 0220   BILIRUBINUR Negative 09/01/2019 1532   BILIRUBINUR Negative 05/07/2017 1614    KETONESUR NEGATIVE 08/12/2022 0220   PROTEINUR 30 (A) 08/12/2022 0220   UROBILINOGEN 0.2 09/01/2019 1532   UROBILINOGEN 0.2 11/04/2017 1904   NITRITE NEGATIVE 08/12/2022 0220   LEUKOCYTESUR MODERATE (A) 08/12/2022 0220   Sepsis Labs Recent Labs  Lab 08/15/22 0324 08/16/22 0348 08/17/22 0326 08/18/22 0518  WBC 18.7* 17.4* 15.6* 16.8*   Microbiology Recent Results (from the past 240 hour(s))  Blood culture (routine x 2)     Status: None   Collection Time: 08/12/22  1:00 AM   Specimen: BLOOD LEFT ARM  Result Value Ref Range Status   Specimen Description BLOOD LEFT ARM  Final   Special Requests   Final    BOTTLES DRAWN AEROBIC AND ANAEROBIC Blood Culture adequate  volume   Culture   Final    NO GROWTH 5 DAYS Performed at [facility], [place] [address] [place], [place] [address]    Report Status 08/17/2022 FINAL  Final  Urine Culture     Status: Abnormal   Collection Time: 08/12/22  2:20 AM   Specimen: Urine, Clean Catch  Result Value Ref Range Status   Specimen Description URINE, CLEAN CATCH  Final   Special Requests   Final    NONE Performed at [facility], [place] [address], [place], [place] [address]    Culture 60,000 COLONIES/mL YEAST (A)  Final   Report Status 08/13/2022 FINAL  Final  Blood culture (routine x 2)     Status: None   Collection Time: 08/13/22  8:55 AM   Specimen: BLOOD  Result Value Ref Range Status   Specimen Description BLOOD SITE NOT SPECIFIED  Final   Special Requests   Final    BOTTLES DRAWN AEROBIC AND ANAEROBIC Blood Culture results may not be optimal due to an inadequate volume of blood received in culture bottles   Culture   Final    NO GROWTH 5 DAYS Performed at [facility], [place] [address] [place], [place] [address]    Report Status 08/18/2022 FINAL  Final  C Difficile Quick Screen w PCR reflex     Status: None   Collection Time: 08/13/22  3:34 PM   Specimen: STOOL  Result Value Ref Range Status   C Diff antigen  NEGATIVE NEGATIVE Final   C Diff toxin NEGATIVE NEGATIVE Final   C Diff interpretation No C. difficile detected.  Final    Comment: Performed at [facility], [place] [address] [place], [place] [address]  Gastrointestinal Panel by PCR , Stool     Status: None   Collection Time: 08/13/22  3:34 PM   Specimen: STOOL  Result Value Ref Range Status   Campylobacter species NOT DETECTED NOT DETECTED Final   Plesimonas shigelloides NOT DETECTED NOT DETECTED Final   Salmonella species NOT DETECTED NOT DETECTED Final   Yersinia enterocolitica NOT DETECTED NOT DETECTED Final   Vibrio species NOT DETECTED NOT DETECTED Final   Vibrio cholerae NOT DETECTED NOT DETECTED Final   Enteroaggregative E coli (EAEC) NOT DETECTED NOT DETECTED Final   Enteropathogenic E coli (EPEC) NOT DETECTED NOT DETECTED Final   Enterotoxigenic E coli (ETEC) NOT DETECTED NOT DETECTED Final   Shiga like toxin producing E coli (STEC) NOT DETECTED NOT DETECTED Final   Shigella/Enteroinvasive E coli (EIEC) NOT DETECTED NOT DETECTED Final   Cryptosporidium NOT DETECTED NOT DETECTED Final   Cyclospora cayetanensis NOT DETECTED NOT DETECTED Final   Entamoeba histolytica NOT DETECTED NOT DETECTED Final   Giardia lamblia NOT DETECTED NOT DETECTED Final   Adenovirus F40/41 NOT DETECTED NOT DETECTED Final   Astrovirus NOT DETECTED NOT DETECTED Final   Norovirus GI/GII NOT DETECTED NOT DETECTED Final   Rotavirus A NOT DETECTED NOT DETECTED Final   Sapovirus (I, II, IV, and V) NOT DETECTED NOT DETECTED Final    Comment: Performed at [facility], [address], [place], [place] [address]     SIGNED:   Charlynne Cousins, MD  [facility] 08/18/2022, 9:50 AM Pager   If 7PM-7AM, please contact night-coverage [email or web address] Password [email or web address]

## 2022-08-18 NOTE — Progress Notes (Signed)
RN called report to Hospice and spoke with Sharlyne Pacas RN.  Peripheral IV and Foley catheter to remain in place upon discharge to hospice.

## 2022-08-18 NOTE — Care Management Important Message (Signed)
Important Message  Patient Details  Name: [name] MRN: [id number] Date of Birth: 1941/07/23   Medicare Important Message Given:  Yes     Hannah Beat 08/18/2022, 12:13 PM

## 2022-08-30 DEATH — deceased

## 2022-09-28 ENCOUNTER — Telehealth (HOSPITAL_COMMUNITY): Payer: Medicare Other | Admitting: Psychiatry

## 2023-03-27 ENCOUNTER — Telehealth: Payer: Medicare Other | Admitting: Neurology
